# Patient Record
Sex: Male | Born: 1960 | Race: Black or African American | Hispanic: No | Marital: Married | Smoking: Former smoker
Health system: Southern US, Community
[De-identification: ages and names within clinical notes are randomized; demographics above are authoritative.]

## PROBLEM LIST (undated history)

## (undated) DIAGNOSIS — E785 Hyperlipidemia, unspecified: Secondary | ICD-10-CM

## (undated) DIAGNOSIS — N183 Chronic kidney disease, stage 3 unspecified: Secondary | ICD-10-CM

## (undated) DIAGNOSIS — I10 Essential (primary) hypertension: Secondary | ICD-10-CM

## (undated) DIAGNOSIS — E119 Type 2 diabetes mellitus without complications: Secondary | ICD-10-CM

## (undated) DIAGNOSIS — F32A Depression, unspecified: Secondary | ICD-10-CM

## (undated) DIAGNOSIS — Z91199 Patient's noncompliance with other medical treatment and regimen due to unspecified reason: Secondary | ICD-10-CM

## (undated) DIAGNOSIS — J449 Chronic obstructive pulmonary disease, unspecified: Secondary | ICD-10-CM

## (undated) DIAGNOSIS — F1010 Alcohol abuse, uncomplicated: Secondary | ICD-10-CM

## (undated) DIAGNOSIS — F329 Major depressive disorder, single episode, unspecified: Secondary | ICD-10-CM

## (undated) DIAGNOSIS — I2510 Atherosclerotic heart disease of native coronary artery without angina pectoris: Secondary | ICD-10-CM

## (undated) DIAGNOSIS — C259 Malignant neoplasm of pancreas, unspecified: Secondary | ICD-10-CM

## (undated) DIAGNOSIS — I739 Peripheral vascular disease, unspecified: Secondary | ICD-10-CM

## (undated) DIAGNOSIS — F1410 Cocaine abuse, uncomplicated: Secondary | ICD-10-CM

## (undated) DIAGNOSIS — Z9119 Patient's noncompliance with other medical treatment and regimen: Secondary | ICD-10-CM

## (undated) HISTORY — DX: Peripheral vascular disease, unspecified: I73.9

## (undated) HISTORY — PX: CARDIAC CATHETERIZATION: SHX172

## (undated) HISTORY — PX: CORONARY STENT PLACEMENT: SHX1402

## (undated) HISTORY — PX: FRACTURE SURGERY: SHX138

---

## 2009-07-23 ENCOUNTER — Emergency Department (HOSPITAL_COMMUNITY): Admission: EM | Admit: 2009-07-23 | Discharge: 2009-07-23 | Payer: Self-pay | Admitting: Emergency Medicine

## 2009-07-28 ENCOUNTER — Inpatient Hospital Stay (HOSPITAL_COMMUNITY): Admission: EM | Admit: 2009-07-28 | Discharge: 2009-07-30 | Payer: Self-pay | Admitting: Emergency Medicine

## 2009-10-01 ENCOUNTER — Inpatient Hospital Stay (HOSPITAL_COMMUNITY): Admission: EM | Admit: 2009-10-01 | Discharge: 2009-10-03 | Payer: Self-pay | Admitting: Emergency Medicine

## 2009-10-01 ENCOUNTER — Encounter (INDEPENDENT_AMBULATORY_CARE_PROVIDER_SITE_OTHER): Payer: Self-pay | Admitting: Internal Medicine

## 2010-01-07 ENCOUNTER — Inpatient Hospital Stay (HOSPITAL_COMMUNITY): Admission: EM | Admit: 2010-01-07 | Discharge: 2010-01-09 | Payer: Self-pay | Admitting: Emergency Medicine

## 2010-02-01 ENCOUNTER — Inpatient Hospital Stay (HOSPITAL_COMMUNITY): Admission: EM | Admit: 2010-02-01 | Discharge: 2010-02-03 | Payer: Self-pay | Admitting: Emergency Medicine

## 2010-03-16 ENCOUNTER — Encounter (INDEPENDENT_AMBULATORY_CARE_PROVIDER_SITE_OTHER): Payer: Self-pay | Admitting: Internal Medicine

## 2010-03-18 ENCOUNTER — Encounter: Payer: Self-pay | Admitting: Interventional Cardiology

## 2010-03-19 ENCOUNTER — Other Ambulatory Visit: Payer: Self-pay | Admitting: Cardiovascular Disease

## 2010-03-19 ENCOUNTER — Inpatient Hospital Stay (HOSPITAL_COMMUNITY): Admission: EM | Admit: 2010-03-19 | Discharge: 2010-03-20 | Payer: Self-pay | Admitting: Emergency Medicine

## 2010-03-20 ENCOUNTER — Other Ambulatory Visit: Payer: Self-pay | Admitting: Cardiovascular Disease

## 2010-04-15 ENCOUNTER — Inpatient Hospital Stay (HOSPITAL_COMMUNITY): Admission: EM | Admit: 2010-04-15 | Discharge: 2010-04-16 | Payer: Self-pay | Admitting: Emergency Medicine

## 2010-05-19 ENCOUNTER — Emergency Department (HOSPITAL_COMMUNITY): Admission: EM | Admit: 2010-05-19 | Discharge: 2010-05-19 | Payer: Self-pay | Admitting: Emergency Medicine

## 2010-09-11 ENCOUNTER — Ambulatory Visit: Payer: Self-pay | Admitting: Cardiovascular Disease

## 2010-09-11 ENCOUNTER — Observation Stay (HOSPITAL_COMMUNITY): Admission: EM | Admit: 2010-09-11 | Discharge: 2010-09-13 | Payer: Self-pay | Admitting: Emergency Medicine

## 2010-09-12 ENCOUNTER — Ambulatory Visit: Payer: Self-pay | Admitting: Vascular Surgery

## 2010-09-12 ENCOUNTER — Encounter (INDEPENDENT_AMBULATORY_CARE_PROVIDER_SITE_OTHER): Payer: Self-pay | Admitting: Internal Medicine

## 2010-10-21 ENCOUNTER — Emergency Department (HOSPITAL_COMMUNITY): Admission: EM | Admit: 2010-10-21 | Discharge: 2010-10-21 | Payer: Self-pay | Admitting: Emergency Medicine

## 2010-10-27 ENCOUNTER — Emergency Department (HOSPITAL_COMMUNITY)
Admission: EM | Admit: 2010-10-27 | Discharge: 2010-10-27 | Payer: Self-pay | Source: Home / Self Care | Admitting: Emergency Medicine

## 2010-11-12 ENCOUNTER — Inpatient Hospital Stay (HOSPITAL_COMMUNITY)
Admission: EM | Admit: 2010-11-12 | Discharge: 2010-11-14 | Payer: Self-pay | Source: Home / Self Care | Attending: Internal Medicine | Admitting: Internal Medicine

## 2010-12-20 NOTE — H&P (Signed)
NAMEDEE, MADAY NO.:  [id number]  MEDICAL RECORD NO.:  [id number]          PATIENT TYPE:  EMS  LOCATION:  ED                           FACILITY:  [facility]  PHYSICIAN:  Massie Maroon, MD        DATE OF BIRTH:  19-Oct-1961  DATE OF ADMISSION:  11/12/2010 DATE OF DISCHARGE:                             HISTORY & PHYSICAL   CHIEF COMPLAINT:  "I was short of breath."  HISTORY OF PRESENT ILLNESS:  A 50 year old male with a history of asthma, CAD status post MI, stent x3, apparently complains of shortness of breath starting about 3 days ago.  The patient denies any coughing, fever, chills, chest pain, palpitations, nausea, vomiting, but does note that he goes to [employer] and is exposed to secondhand smoke at that meeting. The patient thinks that this triggered his asthma to become worse and his lungs to become wheezy.  The patient had a chest x-ray in the ED which was negative for any acute process.  His white count was withinnormal limits.  ABGs showed a pH 7.374, pCO2 of 40.3, pO2 of 75.4. Though his ABG looks fairly good on 3 L nasal cannula, his clinical lung sounds are tight and diffusely wheezy.  The patient will be admitted for asthma exacerbation.  PAST MEDICAL HISTORY: 1. CAD status post MI, stent x3. 2. Hypertension. 3. Hyperlipidemia. 4. Type 2 diabetes. 5. Chronic kidney disease with a baseline creatinine of 1.5 (chronic     kidney disease stage 3). 6. Asthma.  PAST SURGICAL HISTORY: 1. RCA stent June 2010. 2. October 02, 2009, cardiac catheterization, moderate stenosis in the     proximal LAD, but not flow limiting at that time. 3. March 20, 2010, cardiac catheterization by Dr. Kristeen Miss,     PTCA/stent to the proximal LAD. 4. Apr 17, 2010, cardiac catheterization showed patent stents in the     LAD and distal RCA.  SOCIAL HISTORY:  The patient does not smoke or drink at the present time.  He is a former smoker who quit in October 2009.  He is  living with friends at the [facility].  FAMILY HISTORY:  Positive for breast cancer.  MEDICATIONS: 1. Albuterol inhaler 2 puffs q.4 h. p.r.n. 2. Albuterol nebulizer 0.63 mg/3 mL one neb q.4 h. p.r.n. 3. Artificial tears both eyes 1 drop q.i.d. 4. Asmanex 220 mcg 2 puffs b.i.d. 5. Aspirin 81 mg daily. 6. Citalopram 40 mg p.o. daily. 7. Crestor 40 mg one-half p.o. daily. 8. DermaCerin topical cream OTC 1 application b.i.d. 9. Docusate 100 mg p.o. b.i.d. 10.Ferrous sulfate 325 one p.o. q.a.m. 11.Fish oil 1000 mg p.o. b.i.d. 12.Fluticasone 44 mcg 2 puffs b.i.d. (redundant since the patient is     taking Asmanex). 13.Folic acid 1 mg one p.o. q.a.m. 14.Formoterol 12 mcg 1 caplet p.o. q.a.m. 15.Hydroxyzine 25 mg p.o. b.i.d. p.r.n. 16.Ibuprofen 600 mg p.o. q.i.d. p.r.n. 17.Lantus 60 units subcu daily. 18.Lisinopril/hydrochlorothiazide 20/12.5 mg one p.o. q.a.m. 19.MiraLax 17 g p.o. daily. 20.Multivitamin one p.o. daily. 21.Naphazoline ophthalmic 0.025% both eyes 1 GTT daily. 22.Sublingual nitroglycerin 0.4 mg p.o.  q.5 minutes p.r.n. chest pain     up to 3 doses. 23.Quetiapine (Seroquel) 100 mg p.o. q.h.s. 24.Trazodone 100 mg one-half to one p.o. q.h.s. p.r.n. insomnia.  ALLERGIES:  The patient is highly sensitive to NOVOLOG.  REVIEW OF SYSTEMS:  Negative for all 10 organ systems except for pertinent positives stated above.  PHYSICAL EXAMINATION:  VITAL SIGNS:  Temperature 97.8, pulse 88, blood pressure 124/72, pulse ox is 93% on room air. HEENT:  Anicteric. NECK:  No JVD. HEART:  Borderline tachycardic, S1, S2.  No murmurs, gallops, or rubs. LUNGS:  Diffusely wheezy bilateral lung fields, tight. ABDOMEN:  Soft, nontender, nondistended.  Positive bowel sounds. EXTREMITIES:  No cyanosis, clubbing, or edema. SKIN:  No rashes. LYMPH NODES:  No adenopathy. NEURO EXAM:  Nonfocal, cranial nerves II through XII intact.  Reflexes 2+, symmetric, diffuse with downgoing toes  bilaterally, motor strength 5/5 in all 4 extremities, pinprick intact.  LABORATORY DATA:  ABG; pH 7.374, pCO2 of 40.3, pO2 of 75.4 on 3 L nasal cannula.  WBC 7.6, hemoglobin 14.8, platelet count 161.  Sodium 134, potassium 4.2, BUN 37, creatinine 1.66.  Troponin I less than 0.05. Chest x-ray negative for any acute process.  ASSESSMENT AND PLAN: 1. Asthma exacerbation:  The patient will be placed on Solu-Medrol 80     mg IV q.8 h. along with Symbicort 160/4.5 two puffs p.o. b.i.d.     The patient will be started on Singulair.  We will discontinue     Asmanex and Flovent at the present time as well as Foradil.     Hopefully, he will open up.  He will use Xopenex since there is     less tachycardia with this. 2. Coronary artery disease status post myocardial infarction, status     post stent.  Continue aspirin, clopidogrel, Crestor. 3. Diabetes:  Continue Lantus, regular insulin-sensitive sliding     scale. 4. Hypertension:  Continue lisinopril/hydrochlorothiazide. 5. Deep venous thrombosis prophylaxis.  Lovenox.     Massie Maroon, MD     JYK/MEDQ  D:  11/13/2010  T:  11/13/2010  Job:  [id number]  cc:   Vesta Mixer, M.D.Fax: [phone]  Dr. [facility]  Electronically Signed by Pearson Grippe MD on 12/20/2010 08:03:29 PM

## 2011-02-03 LAB — POCT CARDIAC MARKERS
CKMB, poc: 1.6 ng/mL (ref 1.0–8.0)
Myoglobin, poc: 112 ng/mL (ref 12–200)
Troponin i, poc: <0.05 ng/mL (ref 0.00–0.09)

## 2011-02-03 LAB — CBC
HCT: 41.3 % (ref 39.0–52.0)
Hemoglobin: 14.8 g/dL (ref 13.0–17.0)
MCH: 26.5 pg (ref 26.0–34.0)
MCHC: 35.8 g/dL (ref 30.0–36.0)
MCV: 73.9 fL — ABNORMAL LOW (ref 78.0–100.0)
Platelets: 161 10*3/uL (ref 150–400)
RBC: 5.59 MIL/uL (ref 4.22–5.81)
RDW: 14.3 % (ref 11.5–15.5)
WBC: 7.6 10*3/uL (ref 4.0–10.5)

## 2011-02-03 LAB — GLUCOSE, CAPILLARY
Glucose-Capillary: 176 mg/dL — ABNORMAL HIGH (ref 70–99)
Glucose-Capillary: 230 mg/dL — ABNORMAL HIGH (ref 70–99)
Glucose-Capillary: 238 mg/dL — ABNORMAL HIGH (ref 70–99)
Glucose-Capillary: 288 mg/dL — ABNORMAL HIGH (ref 70–99)
Glucose-Capillary: >600 mg/dL (ref 70–99)
Glucose-Capillary: >600 mg/dL (ref 70–99)
Glucose-Capillary: >600 mg/dL (ref 70–99)

## 2011-02-03 LAB — DIFFERENTIAL
Basophils Absolute: 0 10*3/uL (ref 0.0–0.1)
Basophils Relative: 0 % (ref 0–1)
Eosinophils Absolute: 0.8 10*3/uL — ABNORMAL HIGH (ref 0.0–0.7)
Eosinophils Relative: 10 % — ABNORMAL HIGH (ref 0–5)
Lymphocytes Relative: 36 % (ref 12–46)
Lymphs Abs: 2.7 10*3/uL (ref 0.7–4.0)
Monocytes Absolute: 0.5 10*3/uL (ref 0.1–1.0)
Monocytes Relative: 7 % (ref 3–12)
Neutro Abs: 3.6 10*3/uL (ref 1.7–7.7)
Neutrophils Relative %: 47 % (ref 43–77)

## 2011-02-03 LAB — BLOOD GAS, ARTERIAL
Acid-base deficit: 1.6 mmol/L (ref 0.0–2.0)
Bicarbonate: 22.9 mEq/L (ref 20.0–24.0)
Drawn by: 229971
O2 Content: 3 L/min
O2 Saturation: 94.9 %
Patient temperature: 98.6
TCO2: 20.3 mmol/L (ref 0–100)
pCO2 arterial: 40.3 mmHg (ref 35.0–45.0)
pH, Arterial: 7.374 (ref 7.350–7.450)
pO2, Arterial: 75.4 mmHg — ABNORMAL LOW (ref 80.0–100.0)

## 2011-02-03 LAB — BLOOD GAS, VENOUS
Acid-Base Excess: 0.1 mmol/L (ref 0.0–2.0)
Bicarbonate: 24.8 mEq/L — ABNORMAL HIGH (ref 20.0–24.0)
O2 Content: 8 L/min
O2 Saturation: 84.9 %
Patient temperature: 98.6
TCO2: 21.8 mmol/L (ref 0–100)
pCO2, Ven: 42.6 mmHg — ABNORMAL LOW (ref 45.0–50.0)
pH, Ven: 7.382 — ABNORMAL HIGH (ref 7.250–7.300)
pO2, Ven: 49.9 mmHg — ABNORMAL HIGH (ref 30.0–45.0)

## 2011-02-03 LAB — BASIC METABOLIC PANEL
BUN: 28 mg/dL — ABNORMAL HIGH (ref 6–23)
BUN: 35 mg/dL — ABNORMAL HIGH (ref 6–23)
BUN: 37 mg/dL — ABNORMAL HIGH (ref 6–23)
CO2: 20 mEq/L (ref 19–32)
CO2: 23 mEq/L (ref 19–32)
CO2: 24 mEq/L (ref 19–32)
Calcium: 9.1 mg/dL (ref 8.4–10.5)
Calcium: 9.1 mg/dL (ref 8.4–10.5)
Calcium: 9.2 mg/dL (ref 8.4–10.5)
Chloride: 101 mEq/L (ref 96–112)
Chloride: 103 mEq/L (ref 96–112)
Chloride: 110 mEq/L (ref 96–112)
Creatinine, Ser: 1.4 mg/dL (ref 0.4–1.5)
Creatinine, Ser: 1.66 mg/dL — ABNORMAL HIGH (ref 0.4–1.5)
Creatinine, Ser: 1.75 mg/dL — ABNORMAL HIGH (ref 0.4–1.5)
GFR calc Af Amer: 50 mL/min — ABNORMAL LOW (ref >60)
GFR calc Af Amer: 53 mL/min — ABNORMAL LOW (ref >60)
GFR calc Af Amer: >60 mL/min (ref >60)
GFR calc non Af Amer: 41 mL/min — ABNORMAL LOW (ref >60)
GFR calc non Af Amer: 44 mL/min — ABNORMAL LOW (ref >60)
GFR calc non Af Amer: 53 mL/min — ABNORMAL LOW (ref >60)
Glucose, Bld: 179 mg/dL — ABNORMAL HIGH (ref 70–99)
Glucose, Bld: 351 mg/dL — ABNORMAL HIGH (ref 70–99)
Glucose, Bld: 687 mg/dL (ref 70–99)
Potassium: 4.1 mEq/L (ref 3.5–5.1)
Potassium: 4.2 mEq/L (ref 3.5–5.1)
Potassium: 6 mEq/L — ABNORMAL HIGH (ref 3.5–5.1)
Sodium: 131 mEq/L — ABNORMAL LOW (ref 135–145)
Sodium: 134 mEq/L — ABNORMAL LOW (ref 135–145)
Sodium: 140 mEq/L (ref 135–145)

## 2011-02-04 LAB — URINALYSIS, ROUTINE W REFLEX MICROSCOPIC
Bilirubin Urine: NEGATIVE
Glucose, UA: NEGATIVE mg/dL
Hgb urine dipstick: NEGATIVE
Ketones, ur: NEGATIVE mg/dL
Protein, ur: NEGATIVE mg/dL

## 2011-02-04 LAB — BASIC METABOLIC PANEL
BUN: 24 mg/dL — ABNORMAL HIGH (ref 6–23)
CO2: 22 mEq/L (ref 19–32)
Chloride: 106 mEq/L (ref 96–112)
Creatinine, Ser: 1.59 mg/dL — ABNORMAL HIGH (ref 0.4–1.5)
Glucose, Bld: 182 mg/dL — ABNORMAL HIGH (ref 70–99)

## 2011-02-04 LAB — DIFFERENTIAL
Basophils Absolute: 0 10*3/uL (ref 0.0–0.1)
Eosinophils Absolute: 1 10*3/uL — ABNORMAL HIGH (ref 0.0–0.7)
Eosinophils Relative: 13 % — ABNORMAL HIGH (ref 0–5)
Monocytes Absolute: 0.6 10*3/uL (ref 0.1–1.0)

## 2011-02-04 LAB — CBC
MCH: 26.9 pg (ref 26.0–34.0)
MCV: 80.3 fL (ref 78.0–100.0)
Platelets: 177 10*3/uL (ref 150–400)
RDW: 14.7 % (ref 11.5–15.5)

## 2011-02-04 LAB — POCT I-STAT, CHEM 8
BUN: 27 mg/dL — ABNORMAL HIGH (ref 6–23)
Calcium, Ion: 1.06 mmol/L — ABNORMAL LOW (ref 1.12–1.32)
Creatinine, Ser: 1.5 mg/dL (ref 0.4–1.5)
TCO2: 20 mmol/L (ref 0–100)

## 2011-02-05 LAB — URINALYSIS, ROUTINE W REFLEX MICROSCOPIC
Bilirubin Urine: NEGATIVE
Ketones, ur: NEGATIVE mg/dL
Nitrite: NEGATIVE
Protein, ur: NEGATIVE mg/dL
Urobilinogen, UA: 0.2 mg/dL (ref 0.0–1.0)
pH: 5.5 (ref 5.0–8.0)

## 2011-02-05 LAB — GLUCOSE, CAPILLARY
Glucose-Capillary: 122 mg/dL — ABNORMAL HIGH (ref 70–99)
Glucose-Capillary: 123 mg/dL — ABNORMAL HIGH (ref 70–99)
Glucose-Capillary: 126 mg/dL — ABNORMAL HIGH (ref 70–99)
Glucose-Capillary: 164 mg/dL — ABNORMAL HIGH (ref 70–99)
Glucose-Capillary: 175 mg/dL — ABNORMAL HIGH (ref 70–99)
Glucose-Capillary: 195 mg/dL — ABNORMAL HIGH (ref 70–99)
Glucose-Capillary: 199 mg/dL — ABNORMAL HIGH (ref 70–99)
Glucose-Capillary: 219 mg/dL — ABNORMAL HIGH (ref 70–99)
Glucose-Capillary: 235 mg/dL — ABNORMAL HIGH (ref 70–99)
Glucose-Capillary: 272 mg/dL — ABNORMAL HIGH (ref 70–99)
Glucose-Capillary: 277 mg/dL — ABNORMAL HIGH (ref 70–99)
Glucose-Capillary: 301 mg/dL — ABNORMAL HIGH (ref 70–99)
Glucose-Capillary: 36 mg/dL — CL (ref 70–99)
Glucose-Capillary: 395 mg/dL — ABNORMAL HIGH (ref 70–99)
Glucose-Capillary: 77 mg/dL (ref 70–99)

## 2011-02-05 LAB — CBC
MCH: 26.8 pg (ref 26.0–34.0)
MCH: 27.1 pg (ref 26.0–34.0)
MCHC: 33.3 g/dL (ref 30.0–36.0)
MCHC: 33.9 g/dL (ref 30.0–36.0)
MCHC: 34 g/dL (ref 30.0–36.0)
MCV: 79 fL (ref 78.0–100.0)
MCV: 79.8 fL (ref 78.0–100.0)
Platelets: 163 10*3/uL (ref 150–400)
Platelets: 173 10*3/uL (ref 150–400)
Platelets: 178 10*3/uL (ref 150–400)
RBC: 5.69 MIL/uL (ref 4.22–5.81)
RDW: 14.6 % (ref 11.5–15.5)
RDW: 14.8 % (ref 11.5–15.5)
RDW: 15 % (ref 11.5–15.5)
WBC: 6 10*3/uL (ref 4.0–10.5)

## 2011-02-05 LAB — COMPREHENSIVE METABOLIC PANEL
AST: 36 U/L (ref 0–37)
Albumin: 3.3 g/dL — ABNORMAL LOW (ref 3.5–5.2)
BUN: 20 mg/dL (ref 6–23)
Calcium: 9.1 mg/dL (ref 8.4–10.5)
Chloride: 105 mEq/L (ref 96–112)
Creatinine, Ser: 1.41 mg/dL (ref 0.4–1.5)
GFR calc Af Amer: >60 mL/min (ref >60)
Total Bilirubin: 0.4 mg/dL (ref 0.3–1.2)
Total Protein: 6.3 g/dL (ref 6.0–8.3)

## 2011-02-05 LAB — TSH: TSH: 4.06 u[IU]/mL (ref 0.350–4.500)

## 2011-02-05 LAB — CREATININE, URINE, RANDOM: Creatinine, Urine: 83.4 mg/dL

## 2011-02-05 LAB — LIPID PANEL
Cholesterol: 84 mg/dL (ref 0–200)
HDL: 37 mg/dL — ABNORMAL LOW (ref >39)
Total CHOL/HDL Ratio: 2.3 RATIO
Triglycerides: 118 mg/dL (ref <150)

## 2011-02-05 LAB — DIFFERENTIAL
Basophils Absolute: 0 10*3/uL (ref 0.0–0.1)
Lymphocytes Relative: 38 % (ref 12–46)
Lymphs Abs: 2.2 10*3/uL (ref 0.7–4.0)
Monocytes Absolute: 0.5 10*3/uL (ref 0.1–1.0)
Neutro Abs: 2.5 10*3/uL (ref 1.7–7.7)

## 2011-02-05 LAB — BASIC METABOLIC PANEL
BUN: 26 mg/dL — ABNORMAL HIGH (ref 6–23)
Creatinine, Ser: 1.59 mg/dL — ABNORMAL HIGH (ref 0.4–1.5)
GFR calc non Af Amer: 46 mL/min — ABNORMAL LOW (ref >60)
Glucose, Bld: 397 mg/dL — ABNORMAL HIGH (ref 70–99)

## 2011-02-05 LAB — POCT I-STAT, CHEM 8
Calcium, Ion: 1.11 mmol/L — ABNORMAL LOW (ref 1.12–1.32)
Creatinine, Ser: 1.7 mg/dL — ABNORMAL HIGH (ref 0.4–1.5)
Hemoglobin: 16 g/dL (ref 13.0–17.0)
Sodium: 129 mEq/L — ABNORMAL LOW (ref 135–145)
TCO2: 23 mmol/L (ref 0–100)

## 2011-02-05 LAB — BRAIN NATRIURETIC PEPTIDE: Pro B Natriuretic peptide (BNP): <30 pg/mL (ref 0.0–100.0)

## 2011-02-05 LAB — HEMOGLOBIN A1C: Mean Plasma Glucose: 283 mg/dL — ABNORMAL HIGH (ref <117)

## 2011-02-05 LAB — CARDIAC PANEL(CRET KIN+CKTOT+MB+TROPI)
Relative Index: 1.6 (ref 0.0–2.5)
Total CK: 461 U/L — ABNORMAL HIGH (ref 7–232)
Troponin I: 0.05 ng/mL (ref 0.00–0.06)
Troponin I: 0.07 ng/mL — ABNORMAL HIGH (ref 0.00–0.06)

## 2011-02-05 LAB — POCT CARDIAC MARKERS: Myoglobin, poc: 147 ng/mL (ref 12–200)

## 2011-02-09 LAB — DIFFERENTIAL
Basophils Absolute: 0.1 10*3/uL (ref 0.0–0.1)
Basophils Relative: 1 % (ref 0–1)
Eosinophils Absolute: 0.5 10*3/uL (ref 0.0–0.7)
Monocytes Absolute: 0.7 10*3/uL (ref 0.1–1.0)
Neutro Abs: 2.6 10*3/uL (ref 1.7–7.7)
Neutrophils Relative %: 43 % (ref 43–77)

## 2011-02-09 LAB — CBC
HCT: 40.5 % (ref 39.0–52.0)
Platelets: 176 10*3/uL (ref 150–400)
RBC: 5 MIL/uL (ref 4.22–5.81)
RDW: 15.7 % — ABNORMAL HIGH (ref 11.5–15.5)
WBC: 6.2 10*3/uL (ref 4.0–10.5)

## 2011-02-09 LAB — BASIC METABOLIC PANEL
BUN: 22 mg/dL (ref 6–23)
Creatinine, Ser: 1.55 mg/dL — ABNORMAL HIGH (ref 0.4–1.5)
GFR calc Af Amer: 57 mL/min — ABNORMAL LOW (ref >60)
GFR calc non Af Amer: 48 mL/min — ABNORMAL LOW (ref >60)
Potassium: 5 mEq/L (ref 3.5–5.1)

## 2011-02-09 LAB — POCT CARDIAC MARKERS
CKMB, poc: 2.3 ng/mL (ref 1.0–8.0)
CKMB, poc: 2.8 ng/mL (ref 1.0–8.0)
Myoglobin, poc: 122 ng/mL (ref 12–200)

## 2011-02-10 LAB — POCT CARDIAC MARKERS
CKMB, poc: 2.2 ng/mL (ref 1.0–8.0)
Myoglobin, poc: 303 ng/mL (ref 12–200)
Troponin i, poc: <0.05 ng/mL (ref 0.00–0.09)

## 2011-02-10 LAB — CARDIAC PANEL(CRET KIN+CKTOT+MB+TROPI)
CK, MB: 2.6 ng/mL (ref 0.3–4.0)
Relative Index: 0.2 (ref 0.0–2.5)
Relative Index: 0.2 (ref 0.0–2.5)
Total CK: 1065 U/L — ABNORMAL HIGH (ref 7–232)
Troponin I: 0.03 ng/mL (ref 0.00–0.06)

## 2011-02-10 LAB — CK TOTAL AND CKMB (NOT AT ARMC)
CK, MB: 2.8 ng/mL (ref 0.3–4.0)
CK, MB: 3.1 ng/mL (ref 0.3–4.0)
Relative Index: 0.1 (ref 0.0–2.5)
Total CK: 1942 U/L — ABNORMAL HIGH (ref 7–232)
Total CK: 2173 U/L — ABNORMAL HIGH (ref 7–232)

## 2011-02-10 LAB — CBC
HCT: 37.1 % — ABNORMAL LOW (ref 39.0–52.0)
Hemoglobin: 14.5 g/dL (ref 13.0–17.0)
MCHC: 33.8 g/dL (ref 30.0–36.0)
MCV: 81 fL (ref 78.0–100.0)
Platelets: 159 10*3/uL (ref 150–400)
RBC: 5.28 MIL/uL (ref 4.22–5.81)
RDW: 15.1 % (ref 11.5–15.5)
WBC: 7 10*3/uL (ref 4.0–10.5)

## 2011-02-10 LAB — POCT I-STAT, CHEM 8
BUN: 33 mg/dL — ABNORMAL HIGH (ref 6–23)
Calcium, Ion: 1.22 mmol/L (ref 1.12–1.32)
Chloride: 103 mEq/L (ref 96–112)
HCT: 48 % (ref 39.0–52.0)
Potassium: 4.8 mEq/L (ref 3.5–5.1)
Sodium: 132 mEq/L — ABNORMAL LOW (ref 135–145)

## 2011-02-10 LAB — GLUCOSE, CAPILLARY
Glucose-Capillary: 295 mg/dL — ABNORMAL HIGH (ref 70–99)
Glucose-Capillary: 75 mg/dL (ref 70–99)

## 2011-02-10 LAB — DIFFERENTIAL
Basophils Relative: 1 % (ref 0–1)
Lymphocytes Relative: 35 % (ref 12–46)
Lymphs Abs: 2.4 10*3/uL (ref 0.7–4.0)
Monocytes Absolute: 0.7 10*3/uL (ref 0.1–1.0)
Monocytes Relative: 10 % (ref 3–12)
Neutro Abs: 3.5 10*3/uL (ref 1.7–7.7)
Neutrophils Relative %: 50 % (ref 43–77)

## 2011-02-10 LAB — RAPID URINE DRUG SCREEN, HOSP PERFORMED
Barbiturates: NOT DETECTED
Benzodiazepines: NOT DETECTED

## 2011-02-10 LAB — BASIC METABOLIC PANEL
BUN: 31 mg/dL — ABNORMAL HIGH (ref 6–23)
Creatinine, Ser: 1.68 mg/dL — ABNORMAL HIGH (ref 0.4–1.5)
GFR calc Af Amer: 52 mL/min — ABNORMAL LOW (ref >60)
GFR calc non Af Amer: 43 mL/min — ABNORMAL LOW (ref >60)
Potassium: 4.1 mEq/L (ref 3.5–5.1)

## 2011-02-10 LAB — APTT: aPTT: 29 seconds (ref 24–37)

## 2011-02-10 LAB — PROTIME-INR: INR: 0.99 (ref 0.00–1.49)

## 2011-02-11 LAB — CBC
HCT: 40.2 % (ref 39.0–52.0)
Hemoglobin: 13.3 g/dL (ref 13.0–17.0)
Hemoglobin: 13.9 g/dL (ref 13.0–17.0)
Hemoglobin: 14 g/dL (ref 13.0–17.0)
MCHC: 32.8 g/dL (ref 30.0–36.0)
MCHC: 33.1 g/dL (ref 30.0–36.0)
MCHC: 33.2 g/dL (ref 30.0–36.0)
MCHC: 33.4 g/dL (ref 30.0–36.0)
MCHC: 34.7 g/dL (ref 30.0–36.0)
MCV: 80.1 fL (ref 78.0–100.0)
MCV: 81.6 fL (ref 78.0–100.0)
MCV: 81.8 fL (ref 78.0–100.0)
Platelets: 152 10*3/uL (ref 150–400)
Platelets: 181 10*3/uL (ref 150–400)
Platelets: 184 10*3/uL (ref 150–400)
RBC: 4.76 MIL/uL (ref 4.22–5.81)
RBC: 4.95 MIL/uL (ref 4.22–5.81)
RBC: 5.16 MIL/uL (ref 4.22–5.81)
RDW: 14.9 % (ref 11.5–15.5)
RDW: 15.1 % (ref 11.5–15.5)
WBC: 5.9 10*3/uL (ref 4.0–10.5)

## 2011-02-11 LAB — COMPREHENSIVE METABOLIC PANEL
ALT: 44 U/L (ref 0–53)
AST: 28 U/L (ref 0–37)
AST: 29 U/L (ref 0–37)
Albumin: 3.3 g/dL — ABNORMAL LOW (ref 3.5–5.2)
Alkaline Phosphatase: 57 U/L (ref 39–117)
BUN: 26 mg/dL — ABNORMAL HIGH (ref 6–23)
CO2: 27 mEq/L (ref 19–32)
Calcium: 9.6 mg/dL (ref 8.4–10.5)
Chloride: 103 mEq/L (ref 96–112)
Creatinine, Ser: 1.52 mg/dL — ABNORMAL HIGH (ref 0.4–1.5)
GFR calc Af Amer: 59 mL/min — ABNORMAL LOW (ref >60)
GFR calc Af Amer: >60 mL/min (ref >60)
GFR calc non Af Amer: 49 mL/min — ABNORMAL LOW (ref >60)
GFR calc non Af Amer: 51 mL/min — ABNORMAL LOW (ref >60)
Glucose, Bld: 158 mg/dL — ABNORMAL HIGH (ref 70–99)
Potassium: 4.5 mEq/L (ref 3.5–5.1)
Sodium: 136 mEq/L (ref 135–145)
Total Bilirubin: 0.2 mg/dL — ABNORMAL LOW (ref 0.3–1.2)

## 2011-02-11 LAB — GLUCOSE, CAPILLARY
Glucose-Capillary: 138 mg/dL — ABNORMAL HIGH (ref 70–99)
Glucose-Capillary: 163 mg/dL — ABNORMAL HIGH (ref 70–99)
Glucose-Capillary: 168 mg/dL — ABNORMAL HIGH (ref 70–99)
Glucose-Capillary: 210 mg/dL — ABNORMAL HIGH (ref 70–99)
Glucose-Capillary: 221 mg/dL — ABNORMAL HIGH (ref 70–99)
Glucose-Capillary: 229 mg/dL — ABNORMAL HIGH (ref 70–99)
Glucose-Capillary: 241 mg/dL — ABNORMAL HIGH (ref 70–99)
Glucose-Capillary: 243 mg/dL — ABNORMAL HIGH (ref 70–99)
Glucose-Capillary: 252 mg/dL — ABNORMAL HIGH (ref 70–99)
Glucose-Capillary: 263 mg/dL — ABNORMAL HIGH (ref 70–99)
Glucose-Capillary: 271 mg/dL — ABNORMAL HIGH (ref 70–99)
Glucose-Capillary: 57 mg/dL — ABNORMAL LOW (ref 70–99)
Glucose-Capillary: 64 mg/dL — ABNORMAL LOW (ref 70–99)
Glucose-Capillary: 68 mg/dL — ABNORMAL LOW (ref 70–99)
Glucose-Capillary: 72 mg/dL (ref 70–99)
Glucose-Capillary: 78 mg/dL (ref 70–99)
Glucose-Capillary: 80 mg/dL (ref 70–99)

## 2011-02-11 LAB — BASIC METABOLIC PANEL
BUN: 20 mg/dL (ref 6–23)
CO2: 23 mEq/L (ref 19–32)
CO2: 24 mEq/L (ref 19–32)
CO2: 25 mEq/L (ref 19–32)
Calcium: 9.1 mg/dL (ref 8.4–10.5)
Chloride: 103 mEq/L (ref 96–112)
Creatinine, Ser: 1.56 mg/dL — ABNORMAL HIGH (ref 0.4–1.5)
GFR calc Af Amer: 57 mL/min — ABNORMAL LOW (ref >60)
Glucose, Bld: 149 mg/dL — ABNORMAL HIGH (ref 70–99)
Glucose, Bld: 245 mg/dL — ABNORMAL HIGH (ref 70–99)
Glucose, Bld: 384 mg/dL — ABNORMAL HIGH (ref 70–99)
Potassium: 4.6 mEq/L (ref 3.5–5.1)
Potassium: 4.8 mEq/L (ref 3.5–5.1)
Sodium: 133 mEq/L — ABNORMAL LOW (ref 135–145)
Sodium: 134 mEq/L — ABNORMAL LOW (ref 135–145)

## 2011-02-11 LAB — CARDIAC PANEL(CRET KIN+CKTOT+MB+TROPI)
Relative Index: 1.1 (ref 0.0–2.5)
Total CK: 214 U/L (ref 7–232)
Troponin I: 0.01 ng/mL (ref 0.00–0.06)
Troponin I: 0.02 ng/mL (ref 0.00–0.06)

## 2011-02-11 LAB — DIFFERENTIAL
Basophils Relative: 1 % (ref 0–1)
Eosinophils Absolute: 0.3 10*3/uL (ref 0.0–0.7)
Eosinophils Relative: 6 % — ABNORMAL HIGH (ref 0–5)
Eosinophils Relative: 6 % — ABNORMAL HIGH (ref 0–5)
Lymphocytes Relative: 47 % — ABNORMAL HIGH (ref 12–46)
Lymphs Abs: 2.8 10*3/uL (ref 0.7–4.0)
Monocytes Absolute: 0.5 10*3/uL (ref 0.1–1.0)
Monocytes Relative: 9 % (ref 3–12)
Neutro Abs: 2.3 10*3/uL (ref 1.7–7.7)

## 2011-02-11 LAB — CK TOTAL AND CKMB (NOT AT ARMC)
CK, MB: 2.6 ng/mL (ref 0.3–4.0)
Relative Index: 1 (ref 0.0–2.5)

## 2011-02-11 LAB — MAGNESIUM
Magnesium: 2 mg/dL (ref 1.5–2.5)
Magnesium: 2 mg/dL (ref 1.5–2.5)
Magnesium: 2.1 mg/dL (ref 1.5–2.5)

## 2011-02-11 LAB — LIPID PANEL
LDL Cholesterol: 37 mg/dL (ref 0–99)
VLDL: 33 mg/dL (ref 0–40)

## 2011-02-11 LAB — POCT CARDIAC MARKERS: CKMB, poc: 2 ng/mL (ref 1.0–8.0)

## 2011-02-11 LAB — PHOSPHORUS: Phosphorus: 4.4 mg/dL (ref 2.3–4.6)

## 2011-02-11 LAB — HEMOGLOBIN A1C: Hgb A1c MFr Bld: 9.3 % — ABNORMAL HIGH (ref <5.7)

## 2011-02-12 LAB — GLUCOSE, CAPILLARY
Glucose-Capillary: 197 mg/dL — ABNORMAL HIGH (ref 70–99)
Glucose-Capillary: 225 mg/dL — ABNORMAL HIGH (ref 70–99)
Glucose-Capillary: 252 mg/dL — ABNORMAL HIGH (ref 70–99)
Glucose-Capillary: 299 mg/dL — ABNORMAL HIGH (ref 70–99)

## 2011-02-12 LAB — RAPID URINE DRUG SCREEN, HOSP PERFORMED
Amphetamines: NOT DETECTED
Benzodiazepines: NOT DETECTED
Tetrahydrocannabinol: NOT DETECTED

## 2011-02-12 LAB — URINALYSIS, ROUTINE W REFLEX MICROSCOPIC
Bilirubin Urine: NEGATIVE
Hgb urine dipstick: NEGATIVE
Protein, ur: NEGATIVE mg/dL
Urobilinogen, UA: 0.2 mg/dL (ref 0.0–1.0)

## 2011-02-12 LAB — POCT CARDIAC MARKERS: Myoglobin, poc: 87.4 ng/mL (ref 12–200)

## 2011-02-12 LAB — BASIC METABOLIC PANEL
BUN: 29 mg/dL — ABNORMAL HIGH (ref 6–23)
BUN: 30 mg/dL — ABNORMAL HIGH (ref 6–23)
CO2: 24 mEq/L (ref 19–32)
Calcium: 9.1 mg/dL (ref 8.4–10.5)
Calcium: 9.2 mg/dL (ref 8.4–10.5)
Calcium: 9.6 mg/dL (ref 8.4–10.5)
Creatinine, Ser: 1.6 mg/dL — ABNORMAL HIGH (ref 0.4–1.5)
Creatinine, Ser: 1.62 mg/dL — ABNORMAL HIGH (ref 0.4–1.5)
Creatinine, Ser: 1.71 mg/dL — ABNORMAL HIGH (ref 0.4–1.5)
GFR calc Af Amer: 56 mL/min — ABNORMAL LOW (ref >60)
GFR calc non Af Amer: 45 mL/min — ABNORMAL LOW (ref >60)
GFR calc non Af Amer: 46 mL/min — ABNORMAL LOW (ref >60)
Glucose, Bld: 233 mg/dL — ABNORMAL HIGH (ref 70–99)
Glucose, Bld: 376 mg/dL — ABNORMAL HIGH (ref 70–99)
Glucose, Bld: 410 mg/dL — ABNORMAL HIGH (ref 70–99)
Potassium: 4.9 mEq/L (ref 3.5–5.1)
Sodium: 138 mEq/L (ref 135–145)

## 2011-02-12 LAB — CARDIAC PANEL(CRET KIN+CKTOT+MB+TROPI)
CK, MB: 2.7 ng/mL (ref 0.3–4.0)
Relative Index: 2 (ref 0.0–2.5)
Total CK: 129 U/L (ref 7–232)
Total CK: 132 U/L (ref 7–232)

## 2011-02-12 LAB — CK TOTAL AND CKMB (NOT AT ARMC)
CK, MB: 1.9 ng/mL (ref 0.3–4.0)
Relative Index: 1.4 (ref 0.0–2.5)

## 2011-02-12 LAB — DIFFERENTIAL
Basophils Absolute: 0 10*3/uL (ref 0.0–0.1)
Basophils Relative: 0 % (ref 0–1)
Monocytes Absolute: 0.5 10*3/uL (ref 0.1–1.0)
Neutro Abs: 3.6 10*3/uL (ref 1.7–7.7)
Neutrophils Relative %: 46 % (ref 43–77)

## 2011-02-12 LAB — HEMOGLOBIN A1C
Hgb A1c MFr Bld: 9.8 % — ABNORMAL HIGH (ref 4.6–6.1)
Mean Plasma Glucose: 235 mg/dL

## 2011-02-12 LAB — CBC
Hemoglobin: 13.9 g/dL (ref 13.0–17.0)
MCHC: 33.5 g/dL (ref 30.0–36.0)
Platelets: 173 10*3/uL (ref 150–400)
RDW: 14.7 % (ref 11.5–15.5)

## 2011-02-12 LAB — BLOOD GAS, ARTERIAL
Acid-Base Excess: 0.6 mmol/L (ref 0.0–2.0)
Bicarbonate: 24.3 mEq/L — ABNORMAL HIGH (ref 20.0–24.0)
Bicarbonate: 25.3 mEq/L — ABNORMAL HIGH (ref 20.0–24.0)
O2 Saturation: 83.3 %
Patient temperature: 98.6
TCO2: 21.5 mmol/L (ref 0–100)
TCO2: 22.5 mmol/L (ref 0–100)
pCO2 arterial: 37.7 mmHg (ref 35.0–45.0)
pH, Arterial: 7.425 (ref 7.350–7.450)
pO2, Arterial: 77.2 mmHg — ABNORMAL LOW (ref 80.0–100.0)

## 2011-02-12 LAB — HEPATIC FUNCTION PANEL
AST: 29 U/L (ref 0–37)
Albumin: 3.5 g/dL (ref 3.5–5.2)
Bilirubin, Direct: 0.1 mg/dL (ref 0.0–0.3)

## 2011-02-12 LAB — TROPONIN I: Troponin I: 0.03 ng/mL (ref 0.00–0.06)

## 2011-02-12 LAB — URINE MICROSCOPIC-ADD ON: Urine-Other: NONE SEEN

## 2011-02-17 LAB — BASIC METABOLIC PANEL
Calcium: 9.5 mg/dL (ref 8.4–10.5)
GFR calc Af Amer: 59 mL/min — ABNORMAL LOW (ref >60)
GFR calc non Af Amer: 49 mL/min — ABNORMAL LOW (ref >60)
Glucose, Bld: 296 mg/dL — ABNORMAL HIGH (ref 70–99)
Potassium: 4.7 mEq/L (ref 3.5–5.1)
Sodium: 134 mEq/L — ABNORMAL LOW (ref 135–145)

## 2011-02-17 LAB — URINALYSIS, ROUTINE W REFLEX MICROSCOPIC
Leukocytes, UA: NEGATIVE
Nitrite: NEGATIVE
Specific Gravity, Urine: 1.03 (ref 1.005–1.030)
pH: 5 (ref 5.0–8.0)

## 2011-02-17 LAB — GLUCOSE, CAPILLARY
Glucose-Capillary: 255 mg/dL — ABNORMAL HIGH (ref 70–99)
Glucose-Capillary: 351 mg/dL — ABNORMAL HIGH (ref 70–99)
Glucose-Capillary: 358 mg/dL — ABNORMAL HIGH (ref 70–99)

## 2011-02-17 LAB — HEMOGLOBIN A1C
Hgb A1c MFr Bld: 9.8 % — ABNORMAL HIGH (ref 4.6–6.1)
Mean Plasma Glucose: 235 mg/dL

## 2011-02-17 LAB — DIFFERENTIAL
Basophils Relative: 1 % (ref 0–1)
Eosinophils Absolute: 1.2 10*3/uL — ABNORMAL HIGH (ref 0.0–0.7)
Monocytes Relative: 5 % (ref 3–12)
Neutrophils Relative %: 50 % (ref 43–77)

## 2011-02-17 LAB — CBC
MCHC: 32.6 g/dL (ref 30.0–36.0)
MCV: 81.8 fL (ref 78.0–100.0)
RBC: 5.59 MIL/uL (ref 4.22–5.81)
RDW: 15.1 % (ref 11.5–15.5)

## 2011-02-17 LAB — BLOOD GAS, ARTERIAL
Bicarbonate: 19 mEq/L — ABNORMAL LOW (ref 20.0–24.0)
O2 Saturation: 96.4 %
Patient temperature: 98.6
TCO2: 16.3 mmol/L (ref 0–100)

## 2011-02-17 LAB — URINE MICROSCOPIC-ADD ON

## 2011-02-26 LAB — CK TOTAL AND CKMB (NOT AT ARMC)
CK, MB: 1.9 ng/mL (ref 0.3–4.0)
Relative Index: 0.9 (ref 0.0–2.5)
Total CK: 201 U/L (ref 7–232)
Total CK: 257 U/L — ABNORMAL HIGH (ref 7–232)

## 2011-02-26 LAB — POCT I-STAT, CHEM 8
BUN: 26 mg/dL — ABNORMAL HIGH (ref 6–23)
Calcium, Ion: 1.27 mmol/L (ref 1.12–1.32)
Chloride: 105 mEq/L (ref 96–112)
Potassium: 4.3 mEq/L (ref 3.5–5.1)

## 2011-02-26 LAB — GLUCOSE, CAPILLARY
Glucose-Capillary: 182 mg/dL — ABNORMAL HIGH (ref 70–99)
Glucose-Capillary: 256 mg/dL — ABNORMAL HIGH (ref 70–99)
Glucose-Capillary: 288 mg/dL — ABNORMAL HIGH (ref 70–99)
Glucose-Capillary: 55 mg/dL — ABNORMAL LOW (ref 70–99)

## 2011-02-26 LAB — CBC
HCT: 40.1 % (ref 39.0–52.0)
Hemoglobin: 14 g/dL (ref 13.0–17.0)
MCHC: 34 g/dL (ref 30.0–36.0)
MCV: 80.5 fL (ref 78.0–100.0)
MCV: 80.7 fL (ref 78.0–100.0)
Platelets: 160 10*3/uL (ref 150–400)
Platelets: 162 10*3/uL (ref 150–400)
RBC: 5.02 MIL/uL (ref 4.22–5.81)
RBC: 5.1 MIL/uL (ref 4.22–5.81)
RDW: 14 % (ref 11.5–15.5)
RDW: 14.4 % (ref 11.5–15.5)
WBC: 7.6 10*3/uL (ref 4.0–10.5)

## 2011-02-26 LAB — BASIC METABOLIC PANEL
BUN: 13 mg/dL (ref 6–23)
CO2: 25 mEq/L (ref 19–32)
CO2: 27 mEq/L (ref 19–32)
GFR calc non Af Amer: 58 mL/min — ABNORMAL LOW (ref >60)
Glucose, Bld: 169 mg/dL — ABNORMAL HIGH (ref 70–99)
Glucose, Bld: 264 mg/dL — ABNORMAL HIGH (ref 70–99)
Potassium: 3.8 mEq/L (ref 3.5–5.1)
Potassium: 4 mEq/L (ref 3.5–5.1)
Sodium: 139 mEq/L (ref 135–145)

## 2011-02-26 LAB — LIPID PANEL
Cholesterol: 140 mg/dL (ref 0–200)
HDL: 31 mg/dL — ABNORMAL LOW (ref >39)
LDL Cholesterol: 75 mg/dL (ref 0–99)
Total CHOL/HDL Ratio: 4.5 RATIO
Triglycerides: 170 mg/dL — ABNORMAL HIGH (ref <150)

## 2011-02-26 LAB — POCT CARDIAC MARKERS
CKMB, poc: 1.2 ng/mL (ref 1.0–8.0)
Troponin i, poc: <0.05 ng/mL (ref 0.00–0.09)
Troponin i, poc: <0.05 ng/mL (ref 0.00–0.09)

## 2011-02-26 LAB — CARDIAC PANEL(CRET KIN+CKTOT+MB+TROPI)
CK, MB: 2 ng/mL (ref 0.3–4.0)
Relative Index: 1.2 (ref 0.0–2.5)
Relative Index: 1.4 (ref 0.0–2.5)
Total CK: 185 U/L (ref 7–232)
Troponin I: 0.03 ng/mL (ref 0.00–0.06)

## 2011-02-26 LAB — RAPID URINE DRUG SCREEN, HOSP PERFORMED
Amphetamines: NOT DETECTED
Cocaine: NOT DETECTED
Opiates: NOT DETECTED
Tetrahydrocannabinol: NOT DETECTED

## 2011-02-26 LAB — HEMOGLOBIN A1C
Hgb A1c MFr Bld: 9.8 % — ABNORMAL HIGH (ref 4.6–6.1)
Mean Plasma Glucose: 235 mg/dL

## 2011-02-28 LAB — CBC
HCT: 43.1 % (ref 39.0–52.0)
HCT: 43.3 % (ref 39.0–52.0)
Hemoglobin: 14.7 g/dL (ref 13.0–17.0)
Hemoglobin: 14.8 g/dL (ref 13.0–17.0)
MCHC: 34.1 g/dL (ref 30.0–36.0)
MCHC: 34.1 g/dL (ref 30.0–36.0)
MCV: 80.5 fL (ref 78.0–100.0)
MCV: 80.9 fL (ref 78.0–100.0)
Platelets: 208 10*3/uL (ref 150–400)
RBC: 5.33 MIL/uL (ref 4.22–5.81)
RBC: 5.38 MIL/uL (ref 4.22–5.81)
RDW: 14.7 % (ref 11.5–15.5)
WBC: 13.4 10*3/uL — ABNORMAL HIGH (ref 4.0–10.5)
WBC: 6.4 10*3/uL (ref 4.0–10.5)

## 2011-02-28 LAB — GLUCOSE, CAPILLARY
Glucose-Capillary: 218 mg/dL — ABNORMAL HIGH (ref 70–99)
Glucose-Capillary: 256 mg/dL — ABNORMAL HIGH (ref 70–99)
Glucose-Capillary: 304 mg/dL — ABNORMAL HIGH (ref 70–99)
Glucose-Capillary: 336 mg/dL — ABNORMAL HIGH (ref 70–99)

## 2011-02-28 LAB — CARDIAC PANEL(CRET KIN+CKTOT+MB+TROPI)
CK, MB: 5 ng/mL — ABNORMAL HIGH (ref 0.3–4.0)
Relative Index: 4.2 — ABNORMAL HIGH (ref 0.0–2.5)
Total CK: 120 U/L (ref 7–232)

## 2011-02-28 LAB — LIPID PANEL
HDL: 46 mg/dL
Total CHOL/HDL Ratio: 3.7 ratio
Triglycerides: 56 mg/dL
VLDL: 11 mg/dL (ref 0–40)

## 2011-02-28 LAB — POCT CARDIAC MARKERS
CKMB, poc: 3.1 ng/mL (ref 1.0–8.0)
Myoglobin, poc: 137 ng/mL (ref 12–200)

## 2011-02-28 LAB — URINALYSIS, ROUTINE W REFLEX MICROSCOPIC
Glucose, UA: >1000 mg/dL — AB
Leukocytes, UA: NEGATIVE
pH: 5 (ref 5.0–8.0)

## 2011-02-28 LAB — URINE MICROSCOPIC-ADD ON: Urine-Other: NONE SEEN

## 2011-02-28 LAB — BASIC METABOLIC PANEL
CO2: 22 mEq/L (ref 19–32)
CO2: 24 mEq/L (ref 19–32)
Calcium: 9.2 mg/dL (ref 8.4–10.5)
Chloride: 106 mEq/L (ref 96–112)
Creatinine, Ser: 1.23 mg/dL (ref 0.4–1.5)
GFR calc Af Amer: >60 mL/min (ref >60)
Glucose, Bld: 188 mg/dL — ABNORMAL HIGH (ref 70–99)
Potassium: 3.9 mEq/L (ref 3.5–5.1)
Sodium: 136 mEq/L (ref 135–145)

## 2011-02-28 LAB — PROTIME-INR
INR: 1 (ref 0.00–1.49)
Prothrombin Time: 13.4 s (ref 11.6–15.2)

## 2011-02-28 LAB — DIFFERENTIAL
Basophils Relative: 1 % (ref 0–1)
Eosinophils Absolute: 1.1 10*3/uL — ABNORMAL HIGH (ref 0.0–0.7)
Lymphs Abs: 2.3 10*3/uL (ref 0.7–4.0)
Monocytes Absolute: 0.5 10*3/uL (ref 0.1–1.0)
Monocytes Relative: 8 % (ref 3–12)

## 2011-02-28 LAB — HEMOGLOBIN A1C
Hgb A1c MFr Bld: 9.9 % — ABNORMAL HIGH (ref 4.6–6.1)
Mean Plasma Glucose: 237 mg/dL

## 2011-02-28 LAB — PHOSPHORUS: Phosphorus: 2.4 mg/dL (ref 2.3–4.6)

## 2011-02-28 LAB — MAGNESIUM: Magnesium: 2 mg/dL (ref 1.5–2.5)

## 2011-02-28 LAB — URINE CULTURE
Colony Count: NO GROWTH
Culture: NO GROWTH
Special Requests: NEGATIVE

## 2011-02-28 LAB — TSH: TSH: 0.666 u[IU]/mL (ref 0.350–4.500)

## 2011-03-01 LAB — DIFFERENTIAL
Basophils Relative: 1 % (ref 0–1)
Eosinophils Absolute: 1.4 10*3/uL — ABNORMAL HIGH (ref 0.0–0.7)
Lymphs Abs: 2.3 10*3/uL (ref 0.7–4.0)
Monocytes Absolute: 0.6 10*3/uL (ref 0.1–1.0)
Monocytes Relative: 8 % (ref 3–12)

## 2011-03-01 LAB — CBC
HCT: 44.2 % (ref 39.0–52.0)
Hemoglobin: 15.2 g/dL (ref 13.0–17.0)
MCHC: 34.4 g/dL (ref 30.0–36.0)
MCV: 80.1 fL (ref 78.0–100.0)
RBC: 5.52 MIL/uL (ref 4.22–5.81)
WBC: 7.5 10*3/uL (ref 4.0–10.5)

## 2011-03-01 LAB — BASIC METABOLIC PANEL
CO2: 24 mEq/L (ref 19–32)
Chloride: 104 mEq/L (ref 96–112)
GFR calc Af Amer: >60 mL/min (ref >60)
Potassium: 4.6 mEq/L (ref 3.5–5.1)
Sodium: 133 mEq/L — ABNORMAL LOW (ref 135–145)

## 2011-03-01 LAB — GLUCOSE, CAPILLARY

## 2011-03-09 ENCOUNTER — Emergency Department (HOSPITAL_COMMUNITY)
Admission: EM | Admit: 2011-03-09 | Discharge: 2011-03-09 | Disposition: A | Discharge status: Home / Self Care | Payer: Non-veteran care | Attending: Emergency Medicine | Admitting: Emergency Medicine

## 2011-03-09 ENCOUNTER — Emergency Department (HOSPITAL_COMMUNITY): Payer: Non-veteran care

## 2011-03-09 ENCOUNTER — Encounter (HOSPITAL_COMMUNITY): Payer: Self-pay | Admitting: Radiology

## 2011-03-09 DIAGNOSIS — I10 Essential (primary) hypertension: Secondary | ICD-10-CM | POA: Insufficient documentation

## 2011-03-09 DIAGNOSIS — R0789 Other chest pain: Secondary | ICD-10-CM | POA: Insufficient documentation

## 2011-03-09 DIAGNOSIS — E7800 Pure hypercholesterolemia, unspecified: Secondary | ICD-10-CM | POA: Insufficient documentation

## 2011-03-09 DIAGNOSIS — I252 Old myocardial infarction: Secondary | ICD-10-CM | POA: Insufficient documentation

## 2011-03-09 DIAGNOSIS — Z794 Long term (current) use of insulin: Secondary | ICD-10-CM | POA: Insufficient documentation

## 2011-03-09 DIAGNOSIS — F329 Major depressive disorder, single episode, unspecified: Secondary | ICD-10-CM | POA: Insufficient documentation

## 2011-03-09 DIAGNOSIS — E119 Type 2 diabetes mellitus without complications: Secondary | ICD-10-CM | POA: Insufficient documentation

## 2011-03-09 DIAGNOSIS — R0602 Shortness of breath: Secondary | ICD-10-CM | POA: Insufficient documentation

## 2011-03-09 DIAGNOSIS — J449 Chronic obstructive pulmonary disease, unspecified: Secondary | ICD-10-CM | POA: Insufficient documentation

## 2011-03-09 DIAGNOSIS — R791 Abnormal coagulation profile: Secondary | ICD-10-CM | POA: Insufficient documentation

## 2011-03-09 DIAGNOSIS — F3289 Other specified depressive episodes: Secondary | ICD-10-CM | POA: Insufficient documentation

## 2011-03-09 DIAGNOSIS — J4489 Other specified chronic obstructive pulmonary disease: Secondary | ICD-10-CM | POA: Insufficient documentation

## 2011-03-09 HISTORY — DX: Essential (primary) hypertension: I10

## 2011-03-09 LAB — DIFFERENTIAL
Eosinophils Absolute: 0.6 10*3/uL (ref 0.0–0.7)
Lymphocytes Relative: 27 % (ref 12–46)
Lymphs Abs: 1.8 10*3/uL (ref 0.7–4.0)
Monocytes Relative: 5 % (ref 3–12)
Neutro Abs: 3.9 10*3/uL (ref 1.7–7.7)
Neutrophils Relative %: 59 % (ref 43–77)

## 2011-03-09 LAB — CBC
Hemoglobin: 14.3 g/dL (ref 13.0–17.0)
MCH: 26.6 pg (ref 26.0–34.0)
MCV: 74.7 fL — ABNORMAL LOW (ref 78.0–100.0)
Platelets: 191 10*3/uL (ref 150–400)
RBC: 5.37 MIL/uL (ref 4.22–5.81)
WBC: 6.6 10*3/uL (ref 4.0–10.5)

## 2011-03-09 LAB — TROPONIN I: Troponin I: 0.05 ng/mL (ref 0.00–0.06)

## 2011-03-09 LAB — CK TOTAL AND CKMB (NOT AT ARMC)
CK, MB: 7 ng/mL (ref 0.3–4.0)
Total CK: 423 U/L — ABNORMAL HIGH (ref 7–232)

## 2011-03-09 LAB — BASIC METABOLIC PANEL
BUN: 27 mg/dL — ABNORMAL HIGH (ref 6–23)
CO2: 23 mEq/L (ref 19–32)
Chloride: 106 mEq/L (ref 96–112)
Potassium: 4.5 mEq/L (ref 3.5–5.1)

## 2011-03-09 LAB — GLUCOSE, CAPILLARY: Glucose-Capillary: 222 mg/dL — ABNORMAL HIGH (ref 70–99)

## 2011-03-09 LAB — D-DIMER, QUANTITATIVE: D-Dimer, Quant: 0.52 ug/mL-FEU — ABNORMAL HIGH (ref 0.00–0.48)

## 2011-03-09 MED ORDER — IOHEXOL 300 MG/ML  SOLN
100.0000 mL | Freq: Once | INTRAMUSCULAR | Status: AC | PRN
  Administered 2011-03-09: 100 mL via INTRAVENOUS

## 2011-03-20 ENCOUNTER — Emergency Department (HOSPITAL_COMMUNITY)
Admission: EM | Admit: 2011-03-20 | Discharge: 2011-03-20 | Disposition: A | Discharge status: Home / Self Care | Payer: Non-veteran care | Attending: Emergency Medicine | Admitting: Emergency Medicine

## 2011-03-20 ENCOUNTER — Emergency Department (HOSPITAL_COMMUNITY): Payer: Non-veteran care

## 2011-03-20 DIAGNOSIS — R079 Chest pain, unspecified: Secondary | ICD-10-CM | POA: Insufficient documentation

## 2011-03-20 DIAGNOSIS — Z794 Long term (current) use of insulin: Secondary | ICD-10-CM | POA: Insufficient documentation

## 2011-03-20 DIAGNOSIS — R0609 Other forms of dyspnea: Secondary | ICD-10-CM | POA: Insufficient documentation

## 2011-03-20 DIAGNOSIS — I10 Essential (primary) hypertension: Secondary | ICD-10-CM | POA: Insufficient documentation

## 2011-03-20 DIAGNOSIS — R0989 Other specified symptoms and signs involving the circulatory and respiratory systems: Secondary | ICD-10-CM | POA: Insufficient documentation

## 2011-03-20 DIAGNOSIS — E119 Type 2 diabetes mellitus without complications: Secondary | ICD-10-CM | POA: Insufficient documentation

## 2011-03-20 DIAGNOSIS — I252 Old myocardial infarction: Secondary | ICD-10-CM | POA: Insufficient documentation

## 2011-03-20 LAB — POCT CARDIAC MARKERS
Myoglobin, poc: 80.6 ng/mL (ref 12–200)
Troponin i, poc: <0.05 ng/mL (ref 0.00–0.09)

## 2011-03-20 LAB — POCT I-STAT, CHEM 8
Creatinine, Ser: 1.7 mg/dL — ABNORMAL HIGH (ref 0.4–1.5)
Glucose, Bld: 214 mg/dL — ABNORMAL HIGH (ref 70–99)
HCT: 45 % (ref 39.0–52.0)
Hemoglobin: 15.3 g/dL (ref 13.0–17.0)
Sodium: 135 mEq/L (ref 135–145)
TCO2: 20 mmol/L (ref 0–100)

## 2012-01-25 IMAGING — CR DG CHEST 1V PORT
1 series · 1 of 1 positions shown · non-contrast
Comparison: None.

CLINICAL DATA: Asthma.  Shortness of breath.

CHEST - 1 VIEW

[view not recorded]
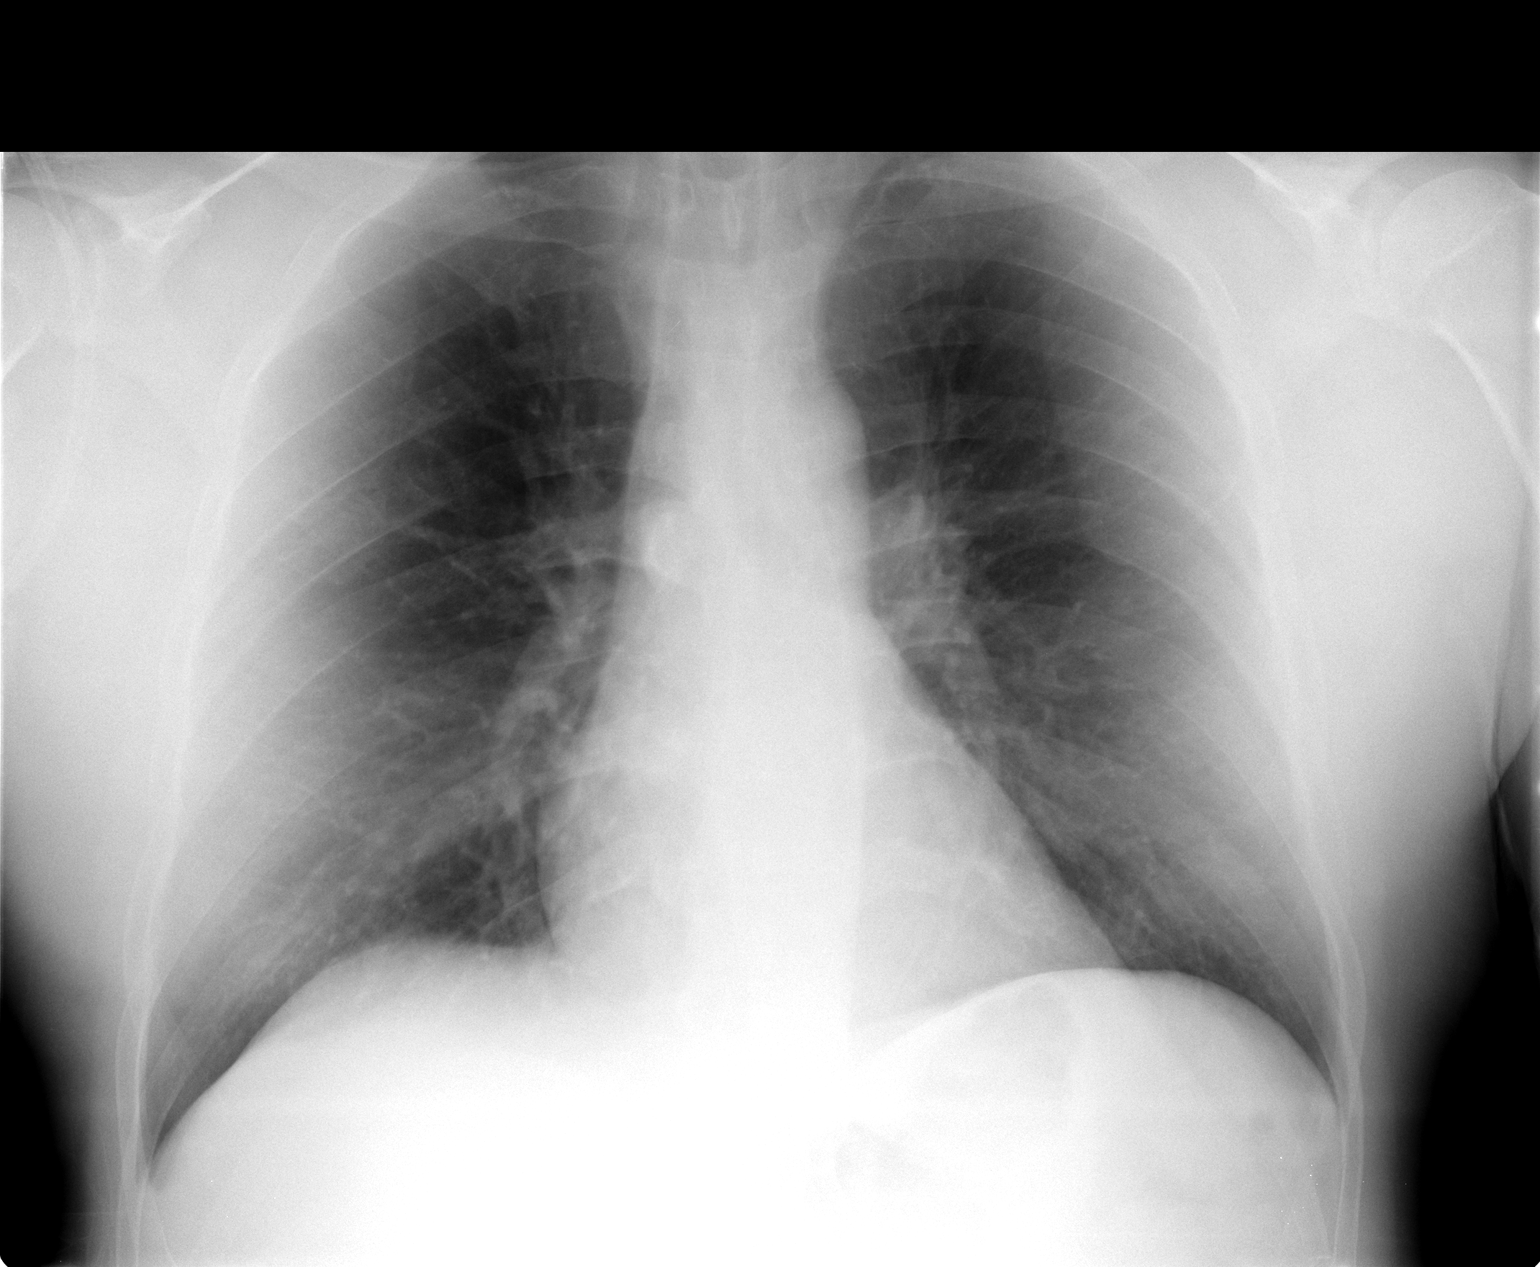

[1 of 1 positions shown; findings below may reference images not displayed]

FINDINGS: The heart size and mediastinal contours are within normal
limits.  Both lungs are clear.  Grossly intact bony thorax.
IMPRESSION: No active disease.

## 2012-07-05 ENCOUNTER — Encounter (HOSPITAL_BASED_OUTPATIENT_CLINIC_OR_DEPARTMENT_OTHER): Payer: Self-pay | Admitting: *Deleted

## 2012-07-05 ENCOUNTER — Emergency Department (HOSPITAL_BASED_OUTPATIENT_CLINIC_OR_DEPARTMENT_OTHER)
Admission: EM | Admit: 2012-07-05 | Discharge: 2012-07-05 | Disposition: A | Discharge status: Home / Self Care | Payer: Non-veteran care | Attending: Emergency Medicine | Admitting: Emergency Medicine

## 2012-07-05 DIAGNOSIS — I2510 Atherosclerotic heart disease of native coronary artery without angina pectoris: Secondary | ICD-10-CM | POA: Insufficient documentation

## 2012-07-05 DIAGNOSIS — Z79899 Other long term (current) drug therapy: Secondary | ICD-10-CM | POA: Insufficient documentation

## 2012-07-05 DIAGNOSIS — Z794 Long term (current) use of insulin: Secondary | ICD-10-CM | POA: Insufficient documentation

## 2012-07-05 DIAGNOSIS — R110 Nausea: Secondary | ICD-10-CM

## 2012-07-05 DIAGNOSIS — R252 Cramp and spasm: Secondary | ICD-10-CM

## 2012-07-05 DIAGNOSIS — R109 Unspecified abdominal pain: Secondary | ICD-10-CM | POA: Insufficient documentation

## 2012-07-05 DIAGNOSIS — E119 Type 2 diabetes mellitus without complications: Secondary | ICD-10-CM | POA: Insufficient documentation

## 2012-07-05 DIAGNOSIS — I10 Essential (primary) hypertension: Secondary | ICD-10-CM | POA: Insufficient documentation

## 2012-07-05 DIAGNOSIS — J45909 Unspecified asthma, uncomplicated: Secondary | ICD-10-CM | POA: Insufficient documentation

## 2012-07-05 LAB — COMPREHENSIVE METABOLIC PANEL
AST: 37 U/L (ref 0–37)
Albumin: 3.8 g/dL (ref 3.5–5.2)
Chloride: 96 mEq/L (ref 96–112)
Creatinine, Ser: 1.4 mg/dL — ABNORMAL HIGH (ref 0.50–1.35)
Total Bilirubin: 0.2 mg/dL — ABNORMAL LOW (ref 0.3–1.2)

## 2012-07-05 LAB — URINALYSIS, ROUTINE W REFLEX MICROSCOPIC
Ketones, ur: NEGATIVE mg/dL
Leukocytes, UA: NEGATIVE
Nitrite: NEGATIVE
Protein, ur: NEGATIVE mg/dL
Urobilinogen, UA: 0.2 mg/dL (ref 0.0–1.0)

## 2012-07-05 LAB — CBC WITH DIFFERENTIAL/PLATELET
Basophils Absolute: 0 10*3/uL (ref 0.0–0.1)
Eosinophils Absolute: 0.3 10*3/uL (ref 0.0–0.7)
HCT: 37.7 % — ABNORMAL LOW (ref 39.0–52.0)
Lymphocytes Relative: 40 % (ref 12–46)
MCHC: 37.1 g/dL — ABNORMAL HIGH (ref 30.0–36.0)
Neutro Abs: 3.7 10*3/uL (ref 1.7–7.7)
Neutrophils Relative %: 49 % (ref 43–77)
Platelets: 158 10*3/uL (ref 150–400)
RDW: 13.6 % (ref 11.5–15.5)

## 2012-07-05 MED ORDER — SODIUM CHLORIDE 0.9 % IV BOLUS (SEPSIS)
1000.0000 mL | Freq: Once | INTRAVENOUS | Status: AC
  Administered 2012-07-05: 1000 mL via INTRAVENOUS

## 2012-07-05 MED ORDER — ONDANSETRON HCL 4 MG/2ML IJ SOLN
4.0000 mg | Freq: Once | INTRAMUSCULAR | Status: AC
  Administered 2012-07-05: 4 mg via INTRAVENOUS
  Filled 2012-07-05: qty 2

## 2012-07-05 NOTE — ED Provider Notes (Signed)
History  This chart was scribed for Chablis Losh B. Bernette Mayers, MD by Ladona Ridgel Day. This patient was seen in room MH10/MH10   CSN: [id number]  Arrival date & time 07/05/12  1734   First MD Initiated Contact with Patient 07/05/12 1918      Chief Complaint  Patient presents with  . Abdominal Pain    The history is provided by the patient. No language interpreter was used.   Jose [name] is a 51 y.o. male who presents to the Emergency Department complaining of gradually worsening cramping in his hands bilaterally for 7 days. He states that this is a new problem for him. He also states nausea and HA as associated symptoms. He also states has been having "light colored" stool but denies any diarrhea. He also denies emesis, fever, and urinary symptoms. 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 He denies drugs, ETOH and smoking. He denies any other injures or illnesses.   Past Medical History  Diagnosis Date  . Diabetes mellitus   . Hypertension   . Asthma   . Pancreatitis   . Coronary artery disease   . Depressed     Past Surgical History  Procedure Date  . Cardiac catheterization     History reviewed. No pertinent family history.  History  Substance Use Topics  . Smoking status: Never Smoker   . Smokeless tobacco: Not on file  . Alcohol Use: No      Review of Systems A complete 10 system review of systems was obtained and all systems are negative except as noted in the HPI and PMH.   Allergies  Review of patient's allergies indicates no known allergies.  Home Medications   Current Outpatient Rx  Name Route Sig Dispense Refill  . ALBUTEROL SULFATE HFA 108 (90 BASE) MCG/ACT IN AERS Inhalation Inhale 2 puffs into the lungs every 6 (six) hours as needed. For wheezing and shortness of breath.    . ASPIRIN 81 MG PO TABS Oral Take 81 mg by mouth daily.    Marland Kitchen BENAZEPRIL-HYDROCHLOROTHIAZIDE 10-12.5 MG PO TABS Oral Take 1 tablet by  mouth daily.    . BUDESONIDE-FORMOTEROL FUMARATE 160-4.5 MCG/ACT IN AERO Inhalation Inhale 2 puffs into the lungs 2 (two) times daily.    Marland Kitchen CITALOPRAM HYDROBROMIDE 40 MG PO TABS Oral Take 40 mg by mouth daily.    Marland Kitchen CLOPIDOGREL BISULFATE 75 MG PO TABS Oral Take 75 mg by mouth daily.    Marland Kitchen FERROUS GLUCONATE 325 MG PO TABS Oral Take 325 mg by mouth daily with breakfast.    . IBUPROFEN 200 MG PO TABS Oral Take 400 mg by mouth every 6 (six) hours as needed. For headache.    . IBUPROFEN 800 MG PO TABS Oral Take 800 mg by mouth every 8 (eight) hours as needed.    . INSULIN GLARGINE 100 UNIT/ML [place] SOLN Subcutaneous Inject 33 Units into the skin at bedtime.    Marland Kitchen LATANOPROST 0.005 % OP SOLN Both Eyes Place 1 drop into both eyes at bedtime.    . METHYLCELLULOSE 1 % OP SOLN Both Eyes Place 1 drop into both eyes as needed.    Marland Kitchen METOPROLOL TARTRATE 50 MG PO TABS Oral Take 50 mg by mouth 2 (two) times daily.    Marland Kitchen ONE-DAILY MULTI VITAMINS PO TABS Oral Take 1 tablet by mouth daily.    Marland Kitchen NAPROXEN SODIUM 220 MG PO TABS Oral Take 440 mg by mouth 2 (two)  times daily with a meal. For pain.    Marland Kitchen NITROGLYCERIN 0.4 MG SL SUBL Sublingual Place 0.4 mg under the tongue every 5 (five) minutes as needed.    Marland Kitchen QUETIAPINE FUMARATE 100 MG PO TABS Oral Take 100 mg by mouth at bedtime.    Marland Kitchen ROSUVASTATIN CALCIUM 40 MG PO TABS Oral Take 40 mg by mouth daily.    . TRAZODONE HCL 100 MG PO TABS Oral Take 100 mg by mouth at bedtime.      Triage Vitals: BP 133/75  Pulse 65  Temp 98.2 F (36.8 C) (Oral)  Resp 16  Ht 5\' 11"  (1.803 m)  Wt 173 lb (78.472 kg)  BMI 24.13 kg/m2  SpO2 96%  Physical Exam  Nursing note and vitals reviewed. Constitutional: He is oriented to person, place, and time. He appears well-developed and well-nourished.  HENT:  Head: Normocephalic and atraumatic.  Eyes: EOM are normal. Pupils are equal, round, and reactive to light.  Neck: Normal range of motion. Neck supple.  Cardiovascular: Normal rate,  normal heart sounds and intact distal pulses.   Pulmonary/Chest: Effort normal and breath sounds normal.  Abdominal: Bowel sounds are normal. He exhibits no distension. There is no tenderness.  Musculoskeletal: Normal range of motion. He exhibits no edema and no tenderness.  Neurological: He is alert and oriented to person, place, and time. He has normal strength. No cranial nerve deficit or sensory deficit.  Skin: Skin is warm and dry. No rash noted.  Psychiatric: He has a normal mood and affect.    ED Course  Procedures (including critical care time) DIAGNOSTIC STUDIES: Oxygen Saturation is 96% on room air, normal by my interpretation.    COORDINATION OF CARE: At 725 PM Discussed treatment plan with patient which includes blood work, IV fluids and urine analysis. Patient agrees.   Labs Reviewed  CBC WITH DIFFERENTIAL - Abnormal; Notable for the following:    HCT 37.7 (*)     MCV 74.7 (*)     MCHC 37.1 (*)  RULED OUT INTERFERING SUBSTANCES   All other components within normal limits  COMPREHENSIVE METABOLIC PANEL - Abnormal; Notable for the following:    Sodium 131 (*)     Glucose, Bld 195 (*)     BUN 27 (*)     Creatinine, Ser 1.40 (*)     Total Bilirubin 0.2 (*)     GFR calc non Af Amer 56 (*)     GFR calc Af Amer 65 (*)     All other components within normal limits  CK - Abnormal; Notable for the following:    Total CK 464 (*)     All other components within normal limits  URINALYSIS, ROUTINE W REFLEX MICROSCOPIC  LIPASE, BLOOD   No results found.   No diagnosis found.    MDM  I personally performed the services described in the documentation, which were scribed in my presence. The recorded information has been reviewed and considered.   Pt has mildly elevated CK consistent with previous lab values, but could represent mild rhabdo from statin. Advised to discussed this with his PCP and stop Statin to see if his cramping improves. Nausea has resolved.         Alacia Rehmann B. Bernette Mayers, MD 07/05/12 2054

## 2012-07-05 NOTE — ED Notes (Signed)
Pt c/o abd pain and nausea . Pt also c/o body cramps

## 2012-08-25 ENCOUNTER — Emergency Department (HOSPITAL_COMMUNITY): Payer: Non-veteran care

## 2012-08-25 ENCOUNTER — Observation Stay (HOSPITAL_COMMUNITY)
Admission: EM | Admit: 2012-08-25 | Discharge: 2012-08-26 | Disposition: A | Discharge status: Home / Self Care | Payer: Non-veteran care | Attending: Cardiovascular Disease | Admitting: Cardiovascular Disease

## 2012-08-25 ENCOUNTER — Encounter (HOSPITAL_COMMUNITY)
Admission: EM | Disposition: A | Discharge status: Home / Self Care | Payer: Self-pay | Source: Home / Self Care | Attending: Emergency Medicine

## 2012-08-25 ENCOUNTER — Encounter (HOSPITAL_COMMUNITY): Payer: Self-pay | Admitting: *Deleted

## 2012-08-25 DIAGNOSIS — K861 Other chronic pancreatitis: Secondary | ICD-10-CM | POA: Insufficient documentation

## 2012-08-25 DIAGNOSIS — N183 Chronic kidney disease, stage 3 unspecified: Secondary | ICD-10-CM | POA: Insufficient documentation

## 2012-08-25 DIAGNOSIS — Z23 Encounter for immunization: Secondary | ICD-10-CM | POA: Insufficient documentation

## 2012-08-25 DIAGNOSIS — R0602 Shortness of breath: Secondary | ICD-10-CM | POA: Insufficient documentation

## 2012-08-25 DIAGNOSIS — E119 Type 2 diabetes mellitus without complications: Secondary | ICD-10-CM | POA: Insufficient documentation

## 2012-08-25 DIAGNOSIS — I200 Unstable angina: Secondary | ICD-10-CM | POA: Insufficient documentation

## 2012-08-25 DIAGNOSIS — I2510 Atherosclerotic heart disease of native coronary artery without angina pectoris: Principal | ICD-10-CM | POA: Insufficient documentation

## 2012-08-25 DIAGNOSIS — I129 Hypertensive chronic kidney disease with stage 1 through stage 4 chronic kidney disease, or unspecified chronic kidney disease: Secondary | ICD-10-CM | POA: Insufficient documentation

## 2012-08-25 DIAGNOSIS — J45909 Unspecified asthma, uncomplicated: Secondary | ICD-10-CM | POA: Insufficient documentation

## 2012-08-25 DIAGNOSIS — R079 Chest pain, unspecified: Secondary | ICD-10-CM | POA: Insufficient documentation

## 2012-08-25 DIAGNOSIS — Z9861 Coronary angioplasty status: Secondary | ICD-10-CM | POA: Insufficient documentation

## 2012-08-25 DIAGNOSIS — I10 Essential (primary) hypertension: Secondary | ICD-10-CM

## 2012-08-25 DIAGNOSIS — E785 Hyperlipidemia, unspecified: Secondary | ICD-10-CM | POA: Insufficient documentation

## 2012-08-25 HISTORY — PX: LEFT HEART CATHETERIZATION WITH CORONARY ANGIOGRAM: SHX5451

## 2012-08-25 LAB — BASIC METABOLIC PANEL
BUN: 23 mg/dL (ref 6–23)
Calcium: 10 mg/dL (ref 8.4–10.5)
Creatinine, Ser: 1.37 mg/dL — ABNORMAL HIGH (ref 0.50–1.35)
GFR calc non Af Amer: 57 mL/min — ABNORMAL LOW (ref >90)
Glucose, Bld: 314 mg/dL — ABNORMAL HIGH (ref 70–99)
Sodium: 134 mEq/L — ABNORMAL LOW (ref 135–145)

## 2012-08-25 LAB — GLUCOSE, CAPILLARY
Glucose-Capillary: 56 mg/dL — ABNORMAL LOW (ref 70–99)
Glucose-Capillary: 172 mg/dL — ABNORMAL HIGH (ref 70–99)
Glucose-Capillary: 261 mg/dL — ABNORMAL HIGH (ref 70–99)
Glucose-Capillary: 171 mg/dL — ABNORMAL HIGH (ref 70–99)

## 2012-08-25 LAB — CK TOTAL AND CKMB (NOT AT ARMC)
CK, MB: 8.1 ng/mL (ref 0.3–4.0)
Relative Index: 1.5 (ref 0.0–2.5)

## 2012-08-25 LAB — CBC
HCT: 42 % (ref 39.0–52.0)
Hemoglobin: 15.4 g/dL (ref 13.0–17.0)
MCH: 28.4 pg (ref 26.0–34.0)
MCHC: 36.7 g/dL — ABNORMAL HIGH (ref 30.0–36.0)
MCV: 77.5 fL — ABNORMAL LOW (ref 78.0–100.0)

## 2012-08-25 LAB — PROTIME-INR: Prothrombin Time: 12.3 seconds (ref 11.6–15.2)

## 2012-08-25 LAB — HEMOGLOBIN A1C: Hgb A1c MFr Bld: 8.2 % — ABNORMAL HIGH (ref <5.7)

## 2012-08-25 SURGERY — LEFT HEART CATHETERIZATION WITH CORONARY ANGIOGRAM
Anesthesia: LOCAL | Laterality: Bilateral

## 2012-08-25 MED ORDER — BENAZEPRIL-HYDROCHLOROTHIAZIDE 10-12.5 MG PO TABS: 1.0000 | ORAL_TABLET | Freq: Every day | ORAL | Status: DC

## 2012-08-25 MED ORDER — ASPIRIN 81 MG PO CHEW
81.0000 mg | CHEWABLE_TABLET | Freq: Once | ORAL | Status: AC
  Administered 2012-08-25: 81 mg via ORAL
  Filled 2012-08-25: qty 1

## 2012-08-25 MED ORDER — QUETIAPINE FUMARATE 100 MG PO TABS
100.0000 mg | ORAL_TABLET | Freq: Every day | ORAL | Status: DC
  Administered 2012-08-25: 100 mg via ORAL
  Filled 2012-08-25 (×2): qty 1

## 2012-08-25 MED ORDER — DEXTROSE 50 % IV SOLN: 25.0000 mL | Freq: Once | INTRAVENOUS | Status: AC | PRN

## 2012-08-25 MED ORDER — TRAZODONE HCL 100 MG PO TABS
100.0000 mg | ORAL_TABLET | Freq: Every day | ORAL | Status: DC
  Administered 2012-08-25: 100 mg via ORAL
  Filled 2012-08-25 (×2): qty 1

## 2012-08-25 MED ORDER — BENAZEPRIL HCL 10 MG PO TABS
10.0000 mg | ORAL_TABLET | Freq: Every day | ORAL | Status: DC
  Administered 2012-08-26: 09:00:00 10 mg via ORAL
  Filled 2012-08-25 (×2): qty 1

## 2012-08-25 MED ORDER — ADULT MULTIVITAMIN W/MINERALS CH
1.0000 | ORAL_TABLET | Freq: Every day | ORAL | Status: DC
  Administered 2012-08-26: 1 via ORAL
  Filled 2012-08-25: qty 1

## 2012-08-25 MED ORDER — INSULIN ASPART 100 UNIT/ML ~~LOC~~ SOLN
0.0000 [IU] | Freq: Three times a day (TID) | SUBCUTANEOUS | Status: DC
  Administered 2012-08-25: 18:00:00 8 [IU] via SUBCUTANEOUS
  Administered 2012-08-26: 09:00:00 3 [IU] via SUBCUTANEOUS

## 2012-08-25 MED ORDER — METHOCARBAMOL 500 MG PO TABS
500.0000 mg | ORAL_TABLET | Freq: Once | ORAL | Status: AC
  Administered 2012-08-25: 500 mg via ORAL
  Filled 2012-08-25: qty 1

## 2012-08-25 MED ORDER — MIDAZOLAM HCL 2 MG/2ML IJ SOLN
INTRAMUSCULAR | Status: AC
  Filled 2012-08-25: qty 2

## 2012-08-25 MED ORDER — POLYVINYL ALCOHOL 1.4 % OP SOLN
1.0000 [drp] | OPHTHALMIC | Status: DC | PRN
  Filled 2012-08-25: qty 15

## 2012-08-25 MED ORDER — ALBUTEROL SULFATE HFA 108 (90 BASE) MCG/ACT IN AERS: 2.0000 | INHALATION_SPRAY | Freq: Four times a day (QID) | RESPIRATORY_TRACT | Status: DC | PRN

## 2012-08-25 MED ORDER — METHYLCELLULOSE 1 % OP SOLN: 1.0000 [drp] | OPHTHALMIC | Status: DC | PRN

## 2012-08-25 MED ORDER — FENTANYL CITRATE 0.05 MG/ML IJ SOLN
INTRAMUSCULAR | Status: AC
  Filled 2012-08-25: qty 2

## 2012-08-25 MED ORDER — CLOPIDOGREL BISULFATE 75 MG PO TABS
75.0000 mg | ORAL_TABLET | Freq: Every day | ORAL | Status: DC
  Administered 2012-08-26: 09:00:00 75 mg via ORAL
  Filled 2012-08-25 (×2): qty 1

## 2012-08-25 MED ORDER — ASPIRIN 81 MG PO TABS: 81.0000 mg | ORAL_TABLET | Freq: Every day | ORAL | Status: DC

## 2012-08-25 MED ORDER — TRAVOPROST (BAK FREE) 0.004 % OP SOLN
1.0000 [drp] | Freq: Every day | OPHTHALMIC | Status: DC
  Administered 2012-08-25: 22:00:00 1 [drp] via OPHTHALMIC
  Filled 2012-08-25: qty 2.5

## 2012-08-25 MED ORDER — HYDROCHLOROTHIAZIDE 12.5 MG PO CAPS
12.5000 mg | ORAL_CAPSULE | Freq: Every day | ORAL | Status: DC
  Administered 2012-08-26: 09:00:00 12.5 mg via ORAL
  Filled 2012-08-25: qty 1

## 2012-08-25 MED ORDER — INSULIN GLARGINE 100 UNIT/ML ~~LOC~~ SOLN
30.0000 [IU] | Freq: Every day | SUBCUTANEOUS | Status: DC
  Administered 2012-08-26: 30 [IU] via SUBCUTANEOUS
  Filled 2012-08-25: qty 1

## 2012-08-25 MED ORDER — VERAPAMIL HCL 2.5 MG/ML IV SOLN
INTRAVENOUS | Status: AC
  Filled 2012-08-25: qty 2

## 2012-08-25 MED ORDER — SODIUM CHLORIDE 0.9 % IJ SOLN: 3.0000 mL | INTRAMUSCULAR | Status: DC | PRN

## 2012-08-25 MED ORDER — NITROGLYCERIN 0.4 MG SL SUBL: 0.4000 mg | SUBLINGUAL_TABLET | SUBLINGUAL | Status: DC | PRN

## 2012-08-25 MED ORDER — SODIUM CHLORIDE 0.9 % IJ SOLN: 3.0000 mL | Freq: Two times a day (BID) | INTRAMUSCULAR | Status: DC

## 2012-08-25 MED ORDER — ATORVASTATIN CALCIUM 40 MG PO TABS
40.0000 mg | ORAL_TABLET | Freq: Every day | ORAL | Status: DC
  Administered 2012-08-25: 40 mg via ORAL
  Filled 2012-08-25 (×2): qty 1

## 2012-08-25 MED ORDER — ONE-DAILY MULTI VITAMINS PO TABS: 1.0000 | ORAL_TABLET | Freq: Every day | ORAL | Status: DC

## 2012-08-25 MED ORDER — NITROGLYCERIN 0.2 MG/ML ON CALL CATH LAB
INTRAVENOUS | Status: AC
  Filled 2012-08-25: qty 1

## 2012-08-25 MED ORDER — ASPIRIN 81 MG PO CHEW
324.0000 mg | CHEWABLE_TABLET | ORAL | Status: AC
  Administered 2012-08-25: 324 mg via ORAL
  Filled 2012-08-25: qty 4

## 2012-08-25 MED ORDER — DEXTROSE 50 % IV SOLN
INTRAVENOUS | Status: AC
  Administered 2012-08-25: 50 mL
  Filled 2012-08-25: qty 50

## 2012-08-25 MED ORDER — DEXTROSE 5 % IV SOLN: INTRAVENOUS | Status: DC

## 2012-08-25 MED ORDER — DIAZEPAM 5 MG PO TABS
5.0000 mg | ORAL_TABLET | ORAL | Status: AC
  Administered 2012-08-25: 5 mg via ORAL
  Filled 2012-08-25 (×2): qty 1

## 2012-08-25 MED ORDER — BUDESONIDE-FORMOTEROL FUMARATE 160-4.5 MCG/ACT IN AERO
2.0000 | INHALATION_SPRAY | Freq: Two times a day (BID) | RESPIRATORY_TRACT | Status: DC
  Administered 2012-08-25 – 2012-08-26 (×2): 2 via RESPIRATORY_TRACT
  Filled 2012-08-25 (×2): qty 6

## 2012-08-25 MED ORDER — SODIUM CHLORIDE 0.9 % IV SOLN
INTRAVENOUS | Status: AC
  Administered 2012-08-25: 17:00:00 via INTRAVENOUS

## 2012-08-25 MED ORDER — LORAZEPAM 2 MG/ML IJ SOLN
2.0000 mg | Freq: Once | INTRAMUSCULAR | Status: AC
  Administered 2012-08-25: 2 mg via INTRAVENOUS
  Filled 2012-08-25: qty 1

## 2012-08-25 MED ORDER — SODIUM CHLORIDE 0.9 % IV SOLN: INTRAVENOUS | Status: DC

## 2012-08-25 MED ORDER — OMEGA-3-ACID ETHYL ESTERS 1 G PO CAPS
1.0000 g | ORAL_CAPSULE | Freq: Two times a day (BID) | ORAL | Status: DC
  Administered 2012-08-25 – 2012-08-26 (×2): 1 g via ORAL
  Filled 2012-08-25 (×5): qty 1

## 2012-08-25 MED ORDER — ONDANSETRON HCL 4 MG/2ML IJ SOLN: 4.0000 mg | Freq: Four times a day (QID) | INTRAMUSCULAR | Status: DC | PRN

## 2012-08-25 MED ORDER — ACETAMINOPHEN 325 MG PO TABS
650.0000 mg | ORAL_TABLET | ORAL | Status: DC | PRN
  Administered 2012-08-26: 12:00:00 650 mg via ORAL
  Filled 2012-08-25: qty 2

## 2012-08-25 MED ORDER — SODIUM CHLORIDE 0.9 % IV SOLN: 250.0000 mL | INTRAVENOUS | Status: DC | PRN

## 2012-08-25 MED ORDER — HEPARIN (PORCINE) IN NACL 2-0.9 UNIT/ML-% IJ SOLN
INTRAMUSCULAR | Status: AC
  Filled 2012-08-25: qty 1500

## 2012-08-25 MED ORDER — LIDOCAINE HCL (PF) 1 % IJ SOLN
INTRAMUSCULAR | Status: AC
  Filled 2012-08-25: qty 30

## 2012-08-25 MED ORDER — HEPARIN SODIUM (PORCINE) 1000 UNIT/ML IJ SOLN
INTRAMUSCULAR | Status: AC
  Filled 2012-08-25: qty 1

## 2012-08-25 MED ORDER — ASPIRIN EC 81 MG PO TBEC: 81.0000 mg | DELAYED_RELEASE_TABLET | Freq: Every day | ORAL | Status: DC

## 2012-08-25 NOTE — ED Provider Notes (Signed)
History     CSN: [id number]  Arrival date & time 08/25/12  0820   First MD Initiated Contact with Patient 08/25/12 [phone]      Chief Complaint  Patient presents with  . Chest Pain  . Shortness of Breath    (Consider location/radiation/quality/duration/timing/severity/associated sxs/prior treatment) The history is provided by the patient.   51 year old, male, with known coronary artery disease, and diabetes, and hypertension, presents to emergency department complaining of chest pressure, that feels like something is sitting on his chest along with shortness of breath, and diaphoresis.  His symptoms started when he was at work, exerting himself and 8R relief.  When he rests.  He denies nausea, or vomiting.  He has not had fevers, chills, cough.  He took his Plavix, and one baby aspirin.  This morning.  He does not have chest pain.  At this time.  Past Medical History  Diagnosis Date  . Diabetes mellitus   . Hypertension   . Asthma   . Pancreatitis   . Coronary artery disease   . Depressed     Past Surgical History  Procedure Date  . Cardiac catheterization   . Coronary stent placement     No family history on file.  History  Substance Use Topics  . Smoking status: Never Smoker   . Smokeless tobacco: Not on file  . Alcohol Use: No      Review of Systems  Constitutional: Positive for diaphoresis. Negative for fever and chills.  HENT: Negative for neck pain.   Respiratory: Positive for shortness of breath.   Cardiovascular: Positive for chest pain. Negative for palpitations and leg swelling.  Gastrointestinal: Negative for nausea and vomiting.  All other systems reviewed and are negative.    Allergies  Review of patient's allergies indicates no known allergies.  Home Medications   Current Outpatient Rx  Name Route Sig Dispense Refill  . ALBUTEROL SULFATE HFA 108 (90 BASE) MCG/ACT IN AERS Inhalation Inhale 2 puffs into the lungs every 6 (six) hours as needed.  For wheezing and shortness of breath.    . ASPIRIN 81 MG PO TABS Oral Take 81 mg by mouth daily.    Marland Kitchen BENAZEPRIL-HYDROCHLOROTHIAZIDE 10-12.5 MG PO TABS Oral Take 1 tablet by mouth daily.    . BUDESONIDE-FORMOTEROL FUMARATE 160-4.5 MCG/ACT IN AERO Inhalation Inhale 2 puffs into the lungs 2 (two) times daily.    Marland Kitchen CITALOPRAM HYDROBROMIDE 40 MG PO TABS Oral Take 40 mg by mouth daily.    Marland Kitchen CLOPIDOGREL BISULFATE 75 MG PO TABS Oral Take 75 mg by mouth daily.    Marland Kitchen FERROUS GLUCONATE 325 MG PO TABS Oral Take 325 mg by mouth daily with breakfast.    . IBUPROFEN 200 MG PO TABS Oral Take 400 mg by mouth every 6 (six) hours as needed. For headache.    . IBUPROFEN 800 MG PO TABS Oral Take 800 mg by mouth every 8 (eight) hours as needed.    . INSULIN GLARGINE 100 UNIT/ML [place] SOLN Subcutaneous Inject 33 Units into the skin at bedtime.    Marland Kitchen LATANOPROST 0.005 % OP SOLN Both Eyes Place 1 drop into both eyes at bedtime.    . METHYLCELLULOSE 1 % OP SOLN Both Eyes Place 1 drop into both eyes as needed.    Marland Kitchen METOPROLOL TARTRATE 50 MG PO TABS Oral Take 50 mg by mouth 2 (two) times daily.    Marland Kitchen ONE-DAILY MULTI VITAMINS PO TABS Oral Take 1 tablet by mouth daily.    Marland Kitchen  NAPROXEN SODIUM 220 MG PO TABS Oral Take 440 mg by mouth 2 (two) times daily with a meal. For pain.    Marland Kitchen NITROGLYCERIN 0.4 MG SL SUBL Sublingual Place 0.4 mg under the tongue every 5 (five) minutes as needed.    Marland Kitchen QUETIAPINE FUMARATE 100 MG PO TABS Oral Take 100 mg by mouth at bedtime.    Marland Kitchen ROSUVASTATIN CALCIUM 40 MG PO TABS Oral Take 40 mg by mouth daily.    . TRAZODONE HCL 100 MG PO TABS Oral Take 100 mg by mouth at bedtime.      BP 141/93  Pulse 61  Temp 97.7 F (36.5 C) (Oral)  Resp 18  SpO2 98%  Physical Exam  Nursing note and vitals reviewed. Constitutional: He is oriented to person, place, and time. He appears well-developed and well-nourished. No distress.  HENT:  Head: Normocephalic and atraumatic.  Eyes: Conjunctivae normal are normal.    Neck: Normal range of motion.  Cardiovascular: Normal rate, regular rhythm and intact distal pulses.   No murmur heard. Pulmonary/Chest: Effort normal and breath sounds normal. No respiratory distress. He has no rales.  Abdominal: Soft. Bowel sounds are normal. There is no tenderness.  Musculoskeletal: Normal range of motion. He exhibits no edema.  Neurological: He is alert and oriented to person, place, and time. No cranial nerve deficit.  Skin: Skin is warm and dry. He is not diaphoretic.  Psychiatric: He has a normal mood and affect. Thought content normal.    ED Course  Procedures (including critical care time) 51 year old, male, with known coronary artery disease, presents with symptoms consistent with unstable angina.  Presently, pain free.  We'll do an EKG, chest x-ray, and laboratory testing, for evaluation and then consult cardiology.   Labs Reviewed  CBC  BASIC METABOLIC PANEL  TROPONIN I   No results found.   No diagnosis found.  ECG. Sinus bradycardia at 58 beats per minute. Normal axis. Poor R wave progression. Normal intervals.  normal ST and T waves  10:10 AM Spoke with [employer] cards. They will come eval for unstable angina  MDM  Angina No stemi and trop neg.   asx now.        Cheri Guppy, MD 08/25/12 1011

## 2012-08-25 NOTE — ED Notes (Signed)
Pt is here with chest pain and shortness of breath for the last 2 days.  Pt reports stent placement a couple of years ago with same symptoms.  Pt reports cramping all over

## 2012-08-25 NOTE — ED Notes (Signed)
CALLED CKMB 8.1 AND INDEX OF 1.5 TO KIM CHARGE NURSE

## 2012-08-25 NOTE — CV Procedure (Signed)
    Cardiac Catheterization Operative Report  Jose [name] [id number] 10/2/20134:01 PM No primary provider on file.  Procedure Performed:  1. Left Heart Catheterization 2. Selective Coronary Angiography  Operator: Verne Carrow, MD  Arterial access site:  Right radial artery.   Indication:  Chest pain, known CAD with multiple previous coronary interventions with one stent in proximal to mid LAD in 2011 and two overlapping stents (?type) in the distal RCA.                              Procedure Details: The risks, benefits, complications, treatment options, and expected outcomes were discussed with the patient. The patient and/or family concurred with the proposed plan, giving informed consent. The patient was brought to the cath lab after IV hydration was begun and oral premedication was given. The patient was further sedated with Versed and Fentanyl. The right wrist was assessed with an Allens test which was positive. The right wrist was prepped and draped in a sterile fashion. 1% lidocaine was used for local anesthesia. Using the modified Seldinger access technique, a 5 French sheath was placed in the right radial artery. 3 mg Verapamil was given through the sheath. 4000 units IV heparin was given. Standard diagnostic catheters were used to perform selective coronary angiography. A pigtail catheter was used to measure LV pressures. The sheath was removed from the right radial artery and a Terumo hemostasis band was applied at the arteriotomy site on the right wrist.    There were no immediate complications. The patient was taken to the recovery area in stable condition.   Hemodynamic Findings: Central aortic pressure: 166/90 Left ventricular pressure: 163/9/12  Angiographic Findings:  Left main: The left main has calcification and tapers distally with a 20% stenosis.   Left Anterior Descending Artery: Large caliber vessel that courses to the apex. The proximal vessel has a  patent stent with minimal restenosis, 20% diffuse restenosis throughout the stent. There are two very small caliber diagonal branches with no disease. The small caliber septal perforating branch is jailed by the stent and has 99% ostial stenosis.   Circumflex Artery: Moderate sized vessel with 30% ostial stenosis.   [place] Intermediate: Large caliber vessel with several distal branches. Mild plaque in the proximal vessel.   Right Coronary Artery: Large, dominant vessel with 30% proximal stenosis. There is a patent stent in the distal vessel with diffuse 20-30% in-stent restenosis. The PDA is patent with mild plaque.   Left Ventricular Angiogram: Deferred.   Impression: 1. Double vessel CAD with patent stents distal RCA and mid LAD  Recommendations: He will need to remain on cardiac meds. He has been off of all medications. Would recommend a beta blocker, statin, ASA and possibly long acting nitrates if he has recurrence of chest discomfort.        Complications:  None. The patient tolerated the procedure well.

## 2012-08-25 NOTE — Interval H&P Note (Signed)
History and Physical Interval Note:  08/25/2012 3:25 PM  Jose [name]  has presented today for cardiac cath with the diagnosis of cp  The various methods of treatment have been discussed with the patient and family. After consideration of risks, benefits and other options for treatment, the patient has consented to  Procedure(s) (LRB) with comments: LEFT HEART CATHETERIZATION WITH CORONARY ANGIOGRAM (Bilateral) as a surgical intervention .  The patient's history has been reviewed, patient examined, no change in status, stable for surgery.  I have reviewed the patient's chart and labs.  Questions were answered to the patient's satisfaction.     Rolfe Hartsell

## 2012-08-25 NOTE — Progress Notes (Signed)
TR BAND REMOVAL  LOCATION:    right radial  DEFLATED PER PROTOCOL:    yes  TIME BAND OFF / DRESSING APPLIED:    1815   SITE UPON ARRIVAL:    Level 0  SITE AFTER BAND REMOVAL:    Level 0  REVERSE ALLEN'S TEST:     positive  CIRCULATION SENSATION AND MOVEMENT:    Within Normal Limits   yes  COMMENTS:   Tolerated procedure well 

## 2012-08-25 NOTE — Progress Notes (Signed)
CE results called to [facility] with no new orders. Patient states I feel shaky. CBG 56. Hypoglycemic protocol initiated. D50 25 grams given Iv. Will reevaluate. IVT to floor to attempt 2nd IV access w/out success. Mamie Levers

## 2012-08-25 NOTE — ED Notes (Signed)
CARDIOLOGY PA AT BEDSIDE 

## 2012-08-25 NOTE — ED Notes (Signed)
Reports return of right sided "chest cramping"

## 2012-08-25 NOTE — H&P (Signed)
History and Physical  Patient ID: Jose [name] MRN: [id number], SOB: 21-Apr-1961 51 y.o. Date of Encounter: 08/25/2012, 11:25 AM  [facility] [place] Primary Cardiologist: Dr. Elease Hashimoto  Chief Complaint: chest pain  HPI: 51 y.o. male w/ PMHx significant for CAD (MI '10 s/p PCIs to RCA, s/p PCI to LAD '11), DMII, HTN, HLD, and CKD (stage III) who presented to [facility] on 08/25/2012 with complaints of chest pain.  Patient is a poor historian. Information obtained from medical records. He had an inferior MI with stents placed in his RCA in 2010.  He had placement of an LAD stent on March 20, 2010. Last cath 04/16/10 showed patent stents, otherwise unchanged nonobstructive disease. Echo 2011 showed nl LV systolic fxn, EF 69-62%, no RWMAs, grade 2 diastolic dysfunction. Patient reports having a stress test last year with his cardiologist at the [place] that he thinks was normal.  Chest pain started two days ago and has been intermittent since that time. 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 Chest pain worse when climbing stairs. (+) sob, diaphoresis (-) nausea. CP lasts ~ 30secs and improves with rest. Did not take NTG. Woke up with chest pain at 4am this morning prompting him to seek medical evaluation. (+) orthopnea, PND for the last two days/nights. Reports compliance with all medications including Plavix. Has not taken statin for ~37mo due to muscle cramps. Denies recent illness, fever, chills, cough, edema, syncope, back/abd/calf pain, change in bladder/bowel habits, melena/hematochezia, prolonged immobilization/extended travel, or surgery.  In the ED, EKG shows sinus rhythm 58bpm with no acute ST/T changes. CXR is without acute cardiopulmonary abnormalities. Labs are significant for normal troponin, Crt 1.37, glucose 314, unremarkable CBC. Received 81mg  ASA and Ativan.  Chest pain free now. Patient is lethargic and falls  asleep during examination.   Past Medical History  Diagnosis Date  . Diabetes mellitus, type 2   . Hypertension   . Asthma   . Pancreatitis   . Coronary artery disease     s/p PCI RCA '10, s/p PCI LAD '11  . Depressed   . HLD (hyperlipidemia)   . CKD (chronic kidney disease)     04/16/10 - Cardiac Cath HEMODYNAMICS:  Aortic pressure is 123/75.  No left ventriculogram was performed secondary to the fact that we just performed one a month ago. ANGIOGRAPHY:   Left main.  The left main is smooth and normal. The left anterior descending artery has a stent in the proximal LAD. The stent is widely patent and there is no evidence of restenosis.  The mid left anterior descending artery is quite tortuous and has a 50% taper to it.  This is basically unchanged from his previous catheterization.  The diagonal arteries are small, but normal. The left circumflex artery has minor luminal irregularities. The ramus intermediate artery is a very large and branching vessel. There are minor luminal irregularities in the ramus intermediate vessel. Right coronary artery has minor luminal irregularities.  The distal RCA has 2 stents.  The stents have minor luminal irregularities with a stenosis of around 10% to 20%. CONCLUSIONS: 1. Patent stents in the LAD and the distal right coronary artery. 2. Noncardiac chest pain.  We will discharge the patient to home.  He will follow up with Dr. Elease Hashimoto in several weeks.  03/16/10 - Echo Study Conclusions Left ventricle: There was mild concentric hypertrophy. Systolic function was normal. The estimated ejection fraction was  in the range of 60% to 65%. Wall motion was normal; there were no regional wall motion abnormalities. Features are consistent with a pseudonormal left ventricular filling pattern, with concomitant abnormal relaxation and increased filling pressure (grade 2 diastolic dysfunction). Doppler parameters are consistent with high ventricular filling  pressure  Surgical History:  Past Surgical History  Procedure Date  . Cardiac catheterization   . Coronary stent placement      Home Meds: Medication Sig  albuterol (PROVENTIL HFA;VENTOLIN HFA) 108 (90 BASE) MCG/ACT inhaler Inhale 2 puffs into the lungs every 6 (six) hours as needed. For wheezing and shortness of breath.  aspirin 81 MG tablet Take 81 mg by mouth daily.  budesonide-formoterol (SYMBICORT) 160-4.5 MCG/ACT inhaler Inhale 2 puffs into the lungs 2 (two) times daily.  clopidogrel (PLAVIX) 75 MG tablet Take 75 mg by mouth daily.  ibuprofen (ADVIL,MOTRIN) 200 MG tablet Take 400 mg by mouth every 6 (six) hours as needed. For headache.  insulin glargine (LANTUS) 100 UNIT/ML injection Inject 30 Units into the skin daily.   Multiple Vitamin (MULTIVITAMIN) tablet Take 1 tablet by mouth daily.  Omega-3 Fatty Acids (FISH OIL) 1000 MG CAPS Take 1 capsule by mouth 2 (two) times daily.  QUEtiapine (SEROQUEL) 100 MG tablet Take 100 mg by mouth at bedtime.  Travoprost, BAK Free, (TRAVATAN) 0.004 % SOLN ophthalmic solution Place 1 drop into both eyes at bedtime.  traZODone (DESYREL) 100 MG tablet Take 100 mg by mouth at bedtime.  benazepril-hydrochlorthiazide (LOTENSIN HCT) 10-12.5 MG per tablet Take 1 tablet by mouth daily.  methylcellulose (ARTIFICIAL TEARS) 1 % ophthalmic solution Place 1 drop into both eyes as needed. For dry eyes  nitroGLYCERIN (NITROSTAT) 0.4 MG SL tablet Place 0.4 mg under the tongue every 5 (five) minutes as needed.    Allergies: No Known Allergies  History   Social History  . Marital Status: Married    Spouse Name: N/A    Number of Children: N/A  . Years of Education: N/A   Occupational History  . Not on file.   Social History Main Topics  . Smoking status: Former Smoker    Types: Cigarettes    Quit date: 11/25/2007  . Smokeless tobacco: Never Used  . 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 Drug Use: No  . Sexually Active: No    Other Topics Concern  . Not on file   Social History Narrative  . No narrative on file     Family History  Problem Relation Age of Onset  . Heart disease Mother     MI 19s     Review of Systems: General: negative for chills, fever, night sweats or weight changes.  Cardiovascular: As per HPI Dermatological: negative for rash Respiratory: negative for cough or wheezing Urologic: negative for hematuria Abdominal: negative for nausea, vomiting, diarrhea, bright red blood per rectum, melena, or hematemesis Neurologic: negative for visual changes, syncope, or dizziness All other systems reviewed and are otherwise negative except as noted above.  Labs:   Component Value Date   WBC 5.5 08/25/2012   HGB 15.4 08/25/2012   HCT 42.0 08/25/2012   MCV 77.5* 08/25/2012   PLT 165 08/25/2012    Lab 08/25/12 0850  NA 134*  K 4.8  CL 97  CO2 25  BUN 23  CREATININE 1.37*  CALCIUM 10.0  GLUCOSE 314*    Basename 08/25/12 0850  TROPONINI <0.30    Radiology/Studies:   08/25/2012 - CHEST -  1 VIEW   Findings: The heart size and mediastinal contours are within normal limits.  Both lungs are clear.  IMPRESSION: No active disease.      EKG: 08/25/12 @ 0827 - sinus rhythm 58bpm with no acute ST/T changes  Physical Exam: Blood pressure 139/94, pulse 61, temperature 97.8 F (36.6 C), temperature source Oral, resp. rate 12, SpO2 99.00%. General: Well developed, well nourished, middle-aged black male, in no acute distress. Head: Normocephalic, atraumatic, sclera non-icteric, nares are without discharge Neck: Supple. Negative for carotid bruits. No JVD Lungs: Diminished throughout Breathing is unlabored. Heart: RRR with S1 S2. No murmurs, rubs, or gallops appreciated. Abdomen: Soft, non-tender, non-distended with normoactive bowel sounds. No rebound/guarding. No obvious abdominal masses. Msk: (+) muscle cramps in hands/feet. Strength and tone appear normal for age. Extremities: No edema.  No clubbing or cyanosis. Distal pedal pulses are intact and equal bilaterally. Neuro: Lethargic, oriented X 3. Moves all extremities spontaneously. Psych:  Responds to questions slowly with a flat affect.    ASSESSMENT AND PLAN:  51 y.o. male w/ PMHx significant for CAD (MI '10 s/p PCIs to RCA, s/p PCI to LAD '11), DMII, HTN, HLD, and CKD (stage III) who presented to [facility] on 08/25/2012 with complaints of chest pain.  1. Unstable angina 2. Coronary Artery Disease 3. Diabetes Mellitus, Type 2, uncontrolled 4. Hypertension 5. Hyperlipidemia 6. CKD Stage III 7. Muscle cramps  Patient presents with chest pain with typical features concerning for unstable angina. H/o CAD w/ PCIs 2010 and 2011. Last cardiac cath was > 11yrs ago. Reports compliance with meds, except statin as he has had elevated CKs and muscle cramps. EKG nonischemic and troponin normal. Will place in observation, cycle cardiac enzymes, check ESR & P2Y12, and plan for cardiac cath this afternoon. Crt 1.37. No heparin with normal enzymes.  Assess resumption of statin pending CKs and lipid panel. BPs mildly elevated. Cont antihypertensives. Glucose elevated. Place on SSI and cont Lantus. Check A1c, lipid panel, TSH. Give robaxin for muscle cramps.    Signed, HOPE, JESSICA PA-C 08/25/2012, 11:25 AM  Patient examined chart reviewed.  Recurrent SSCP in poorly controlled diabetic with previous stents.  Cath today.  Given cramping in hands and feet with elevated CPK may have a component of rhabdo.  Hydrate.  SS insulin.  Risk of cath discussed and willing to proceed  Charlton Haws

## 2012-08-26 DIAGNOSIS — I10 Essential (primary) hypertension: Secondary | ICD-10-CM | POA: Insufficient documentation

## 2012-08-26 DIAGNOSIS — E1159 Type 2 diabetes mellitus with other circulatory complications: Secondary | ICD-10-CM | POA: Insufficient documentation

## 2012-08-26 LAB — LIPID PANEL
Cholesterol: 129 mg/dL (ref 0–200)
LDL Cholesterol: 72 mg/dL (ref 0–99)
Total CHOL/HDL Ratio: 3.5 RATIO
VLDL: 20 mg/dL (ref 0–40)

## 2012-08-26 LAB — BASIC METABOLIC PANEL
CO2: 26 mEq/L (ref 19–32)
Calcium: 9.3 mg/dL (ref 8.4–10.5)
Creatinine, Ser: 1.44 mg/dL — ABNORMAL HIGH (ref 0.50–1.35)
GFR calc Af Amer: 63 mL/min — ABNORMAL LOW (ref >90)
Sodium: 137 mEq/L (ref 135–145)

## 2012-08-26 LAB — CBC
MCH: 28.2 pg (ref 26.0–34.0)
Platelets: 149 10*3/uL — ABNORMAL LOW (ref 150–400)
RBC: 5.25 MIL/uL (ref 4.22–5.81)

## 2012-08-26 LAB — GLUCOSE, CAPILLARY: Glucose-Capillary: 154 mg/dL — ABNORMAL HIGH (ref 70–99)

## 2012-08-26 MED ORDER — ASPIRIN EC 81 MG PO TBEC
81.0000 mg | DELAYED_RELEASE_TABLET | Freq: Every day | ORAL | Status: DC
  Administered 2012-08-26: 81 mg via ORAL
  Filled 2012-08-26: qty 1

## 2012-08-26 MED ORDER — INFLUENZA VIRUS VACC SPLIT PF IM SUSP
0.5000 mL | INTRAMUSCULAR | Status: AC
  Administered 2012-08-26: 10:00:00 0.5 mL via INTRAMUSCULAR
  Filled 2012-08-26: qty 0.5

## 2012-08-26 MED ORDER — PNEUMOCOCCAL VAC POLYVALENT 25 MCG/0.5ML IJ INJ
0.5000 mL | INJECTION | INTRAMUSCULAR | Status: AC
  Administered 2012-08-26: 10:00:00 0.5 mL via INTRAMUSCULAR
  Filled 2012-08-26: qty 0.5

## 2012-08-26 NOTE — Progress Notes (Signed)
PROGRESS NOTE  Subjective:   Jose [name] is a 51 yo - assigned to see me.  I have seen him in the hospital - never in the office.  He goes to the [place] in [place].  He presented to the ER with CP yesterday - he had run out of his medications.  Cath yesterday revealed minor luminal irreg. Patent stents.   Objective:    Vital Signs:   Temp:  [97.7 F (36.5 C)-98.5 F (36.9 C)] 97.9 F (36.6 C) (10/03 0615) Pulse Rate:  [46-61] 54  (10/03 0615) Resp:  [12-18] 14  (10/03 0615) BP: (111-149)/(59-94) 115/68 mmHg (10/03 0615) SpO2:  [98 %-100 %] 100 % (10/03 0615) Weight:  [181 lb 8 oz (82.328 kg)-185 lb 6.5 oz (84.1 kg)] 185 lb 6.5 oz (84.1 kg) (10/03 0016)  Last BM Date: 08/25/12   24-hour weight change: Weight change:   Weight trends: Filed Weights   08/25/12 1242 08/26/12 0016  Weight: 181 lb 8 oz (82.328 kg) 185 lb 6.5 oz (84.1 kg)    Intake/Output:  10/02 0701 - 10/03 0700 In: 988.3 [P.O.:600; I.V.:388.3] Out: -      Physical Exam: BP 115/68  Pulse 54  Temp 97.9 F (36.6 C) (Oral)  Resp 14  Ht 5\' 11"  (1.803 m)  Wt 185 lb 6.5 oz (84.1 kg)  BMI 25.86 kg/m2  SpO2 100%  General: Vital signs reviewed and noted.   Head: Normocephalic, atraumatic.  Eyes: conjunctivae/corneas clear.  EOM's intact.   Throat: normal  Neck: Supple. Normal carotids. No JVD  Lungs:  Clear to auscultation  Heart: Regular rate,  With normal  S1 S2. No murmurs, gallops or rubs  Abdomen:  Soft, non-tender, non-distended with normoactive bowel sounds. No hepatomegaly. No rebound/guarding. No abdominal masses.  Extremities: Distal pedal pulses are 2+ .  No edema.    Neurologic: A&O X3, CN II - XII are grossly intact. Motor strength is 5/5 in the all 4 extremities.  Psych: Responds to questions appropriately with normal affect.    Labs: BMET:  Basename 08/25/12 0850  NA 134*  K 4.8  CL 97  CO2 25  GLUCOSE 314*  BUN 23  CREATININE 1.37*  CALCIUM 10.0  MG --  PHOS --    Liver  function tests: No results found for this basename: AST:2,ALT:2,ALKPHOS:2,BILITOT:2,PROT:2,ALBUMIN:2 in the last 72 hours No results found for this basename: LIPASE:2,AMYLASE:2 in the last 72 hours  CBC:  Basename 08/25/12 0850  WBC 5.5  NEUTROABS --  HGB 15.4  HCT 42.0  MCV 77.5*  PLT 165    Cardiac Enzymes:  Basename 08/25/12 1154 08/25/12 0850  CKTOTAL 541* --  CKMB 8.1* --  TROPONINI <0.30 <0.30    Coagulation Studies:  Basename 08/25/12 1155  LABPROT 12.3  INR 0.92    Other: No components found with this basename: POCBNP:3 No results found for this basename: DDIMER in the last 72 hours  Basename 08/25/12 1155  HGBA1C 8.2*   No results found for this basename: CHOL,HDL,LDLCALC,TRIG,CHOLHDL in the last 72 hours  Basename 08/25/12 1155  TSH 1.997  T4TOTAL --  T3FREE --  THYROIDAB --   No results found for this basename: VITAMINB12,FOLATE,FERRITIN,TIBC,IRON,RETICCTPCT in the last 72 hours   Tele:  NSR  Medications:    Infusions:    . sodium chloride 50 mL/hr at 08/25/12 1645  . DISCONTD: sodium chloride    . DISCONTD: dextrose      Scheduled Medications:    . aspirin  324 mg Oral Pre-Cath  . aspirin  81 mg Oral Once  . aspirin EC  81 mg Oral Daily  . atorvastatin  40 mg Oral q1800  . benazepril  10 mg Oral Daily   And  . hydrochlorothiazide  12.5 mg Oral Daily  . budesonide-formoterol  2 puff Inhalation BID  . clopidogrel  75 mg Oral Daily  . dextrose      . diazepam  5 mg Oral On Call  . fentaNYL      . heparin      . heparin      . influenza  inactive virus vaccine  0.5 mL Intramuscular Tomorrow-1000  . insulin aspart  0-15 Units Subcutaneous TID WC  . insulin glargine  30 Units Subcutaneous Daily  . lidocaine      . LORazepam  2 mg Intravenous Once  . methocarbamol  500 mg Oral Once  . midazolam      . multivitamin with minerals  1 tablet Oral Daily  . nitroGLYCERIN      . omega-3 acid ethyl esters  1 g Oral BID  .  pneumococcal 23 valent vaccine  0.5 mL Intramuscular Tomorrow-1000  . QUEtiapine  100 mg Oral QHS  . sodium chloride  3 mL Intravenous Q12H  . Travoprost (BAK Free)  1 drop Both Eyes QHS  . traZODone  100 mg Oral QHS  . verapamil      . DISCONTD: aspirin  81 mg Oral Daily  . DISCONTD: benazepril-hydrochlorthiazide  1 tablet Oral Daily  . DISCONTD: multivitamin  1 tablet Oral Daily    Assessment/ Plan:    1. CAD:  Cath yesterday revealed minor irreg.  No culprit lesions. He will need to take his medications.  I have offered to see him but he will not come to the office.  I suggested that he go to the [facility] cardiologist.   Home today on same meds.  Albuterol HFA ASA 81 symbicort 160-4.5 2 puffs twice a day Plavix Lantus insulin per home dose seroquel 100 Q HS travoprost desyrel per home dose lotensin - HCT NTG PRN    Return to see [place] PRN  Disposition: DC to home today. Length of Stay: 1  Vesta Mixer, Montez Hageman., MD, [facility] 08/26/2012, 8:07 AM Office [phone] Pager [phone]

## 2012-08-26 NOTE — Progress Notes (Signed)
Pt with c/o sob and adamantly requesting steroid/prednisone, stating last time he was admitted with sob that he was given steroids then.  Pts lungs sound clear, no wheezes.  Sats 100% on RA.  CXR negative. Pt was cath'd today-see Dr. Weldon Picking note.  Pt does have symbicort inhaler for tonight but pt stated that would not be enough. [name] PA notified of above and stated that pt can use O2 overnight but no steroids.  Pt declined the need for oxygen and stated he would discuss with doctor in the am and then preceded to eat a sandwich with no resp difficulty/visible sob. Will continue to monitor during shift.

## 2012-08-26 NOTE — Progress Notes (Signed)
Utilization Review Completed.  

## 2012-08-26 NOTE — Discharge Summary (Signed)
CARDIOLOGY DISCHARGE SUMMARY   Patient ID: Jose [name] MRN: [id number] DOB/AGE: [birth year] 50 y.o.  Admit date: 08/25/2012 Discharge date: 08/26/2012  Primary Discharge Diagnosis:  Unstable anginal pain felt secondary to poorly controlled diabetes and possible noncompliance with meds  Secondary Discharge Diagnosis:  Past Medical History  Diagnosis Date  . Diabetes mellitus, type 2   . Hypertension   . Asthma   . Pancreatitis   . Coronary artery disease     s/p PCI RCA '10, s/p PCI LAD '11  . Depressed   . HLD (hyperlipidemia)   . CKD (chronic kidney disease)     Procedures:  1. Left Heart Catheterization 2. Selective Coronary Angiography  Hospital Course: Jose [name] is a 51 year old male with a history of coronary artery disease and MI. He had intermittent chest pain for 2 days and when it became more severe, and woke him, he came to the hospital where he was admitted for further evaluation and treatment.  His CK-MB was elevated but the index was within normal limits. All of his troponins were negative. His blood pressure was initially elevated but it improved. There was concern about compliance with his medications and [facility] prescribed. A hemoglobin A1c was elevated and a lipid profile showed a low HDL but his LDL is acceptable at 72. His chronic kidney disease was followed closely during his hospital stay and he had no significant change, post-cath. He was continued on his home insulin with sliding scale for tight glucose control. His hemoglobin A1c is elevated and he is encouraged to stick to a diabetic diet and follow up with his primary care physician. A P2Y12 test was performed and showed good platelet inhibition. He was mildly hyponatremic on admission but this improved.  His symptoms were concerning for progressive anginal pain. Cardiac catheterization was indicated and this was performed on 08/25/2012. The full  results are listed below. He had nonobstructive disease and medical therapy was recommended.  On 08/26/2012, he was seen by Dr. Elease [name]. Compliance with medications and followup at the [facility] was reinforced. He was ambulating without chest pain or shortness of breath and considered stable for discharge, to [facility], and with [facility] cardiology as needed.   Labs:   Lab Results  Component Value Date   WBC 5.8 08/26/2012   HGB 14.8 08/26/2012   HCT 40.7 08/26/2012   MCV 77.5* 08/26/2012   PLT 149* 08/26/2012     Lab 08/26/12 0550  NA 137  K 4.2  CL 104  CO2 26  BUN 23  CREATININE 1.44*  CALCIUM 9.3  PROT --  BILITOT --  ALKPHOS --  ALT --  AST --  GLUCOSE 177*    Basename 08/25/12 1154 08/25/12 0850  CKTOTAL 541* --  CKMB 8.1* --  CKMBINDEX 1.5 --  TROPONINI <0.30 <0.30   Lipid Panel     Component Value Date/Time   CHOL 129 08/26/2012 0550   TRIG 98 08/26/2012 0550   HDL 37* 08/26/2012 0550   CHOLHDL 3.5 08/26/2012 0550   VLDL 20 08/26/2012 0550   LDLCALC 72 08/26/2012 0550    Basename 08/25/12 1155  INR 0.92      Radiology: Dg Chest Port 1 View  08/25/2012  *RADIOLOGY REPORT*  Clinical Data: Chest pain, shortness of breath  CHEST - 1 VIEW  Comparison:  03/09/2011  Findings: The heart size and mediastinal contours are within normal  limits.  Both lungs are clear.  IMPRESSION: No active disease.   Original Report Authenticated By: Judie Petit. Ruel Favors, M.D.     Cardiac Cath: 08/25/2012 Left main: The left main has calcification and tapers distally with a 20% stenosis.  Left Anterior Descending Artery: Large caliber vessel that courses to the apex. The proximal vessel has a patent stent with minimal restenosis, 20% diffuse restenosis throughout the stent. There are two very small caliber diagonal branches with no disease. The small caliber septal perforating branch is jailed by the stent and has 99% ostial stenosis.  Circumflex Artery: Moderate sized  vessel with 30% ostial stenosis.  Ramus Intermediate: Large caliber vessel with several distal branches. Mild plaque in the proximal vessel.  Right Coronary Artery: Large, dominant vessel with 30% proximal stenosis. There is a patent stent in the distal vessel with diffuse 20-30% in-stent restenosis. The PDA is patent with mild plaque.  Left Ventricular Angiogram: Deferred.  Impression:  1. Double vessel CAD with patent stents distal RCA and mid LAD  EKG: 25-Aug-2012 14:02:53  Sinus bradycardia No significant change since last tracing 52mm/s 51mm/mV 100Hz  8.0.1 12SL 241 HD CID: 1 Referred by: Confirmed By: Rollene Rotunda MD Vent. rate 50 BPM PR interval 122 ms QRS duration 96 ms QT/QTc 430/392 ms P-R-T axes 30 55 30  FOLLOW UP PLANS AND APPOINTMENTS No Known Allergies   Medication List     As of 08/26/2012  9:40 AM    TAKE these medications         albuterol 108 (90 BASE) MCG/ACT inhaler   Commonly known as: PROVENTIL HFA;VENTOLIN HFA   Inhale 2 puffs into the lungs every 6 (six) hours as needed. For wheezing and shortness of breath.      aspirin 81 MG tablet   Take 81 mg by mouth daily.      benazepril-hydrochlorthiazide 10-12.5 MG per tablet   Commonly known as: LOTENSIN HCT   Take 1 tablet by mouth daily.      budesonide-formoterol 160-4.5 MCG/ACT inhaler   Commonly known as: SYMBICORT   Inhale 2 puffs into the lungs 2 (two) times daily.      clopidogrel 75 MG tablet   Commonly known as: PLAVIX   Take 75 mg by mouth daily.      Fish Oil 1000 MG Caps   Take 1 capsule by mouth 2 (two) times daily.      ibuprofen 200 MG tablet   Commonly known as: ADVIL,MOTRIN   Take 400 mg by mouth every 6 (six) hours as needed. For headache.      insulin glargine 100 UNIT/ML injection   Commonly known as: LANTUS   Inject 30 Units into the skin daily.      methylcellulose 1 % ophthalmic solution   Commonly known as: ARTIFICIAL TEARS   Place 1 drop into both eyes as needed.  For dry eyes      multivitamin tablet   Take 1 tablet by mouth daily.      nitroGLYCERIN 0.4 MG SL tablet   Commonly known as: NITROSTAT   Place 0.4 mg under the tongue every 5 (five) minutes as needed.      QUEtiapine 100 MG tablet   Commonly known as: SEROQUEL   Take 100 mg by mouth at bedtime.      Travoprost (BAK Free) 0.004 % Soln ophthalmic solution   Commonly known as: TRAVATAN   Place 1 drop into both eyes at bedtime.      traZODone  100 MG tablet   Commonly known as: DESYREL   Take 100 mg by mouth at bedtime.        Follow-up Information    Follow up with [facility] (As needed)    Contact information:   [address] [place] [address]          BRING ALL MEDICATIONS WITH YOU TO FOLLOW UP APPOINTMENTS  Time spent with patient to include physician time: 35 min Signed: Theodore Demark 08/26/2012, 9:40 AM Co-Sign MD  Attending note: See my note from earlier today.  He needs to follow up with cardiology - either here or at the [place].  He needs to return to see his medical doctor for his COPD meds.  Alvia Grove MD, [facility]

## 2012-09-03 IMAGING — CR DG CHEST 1V PORT
1 series · 1 of 1 positions shown · non-contrast
Comparison: 05/19/2010

CLINICAL DATA: Chest pain

PORTABLE CHEST - 1 VIEW

[view not recorded]
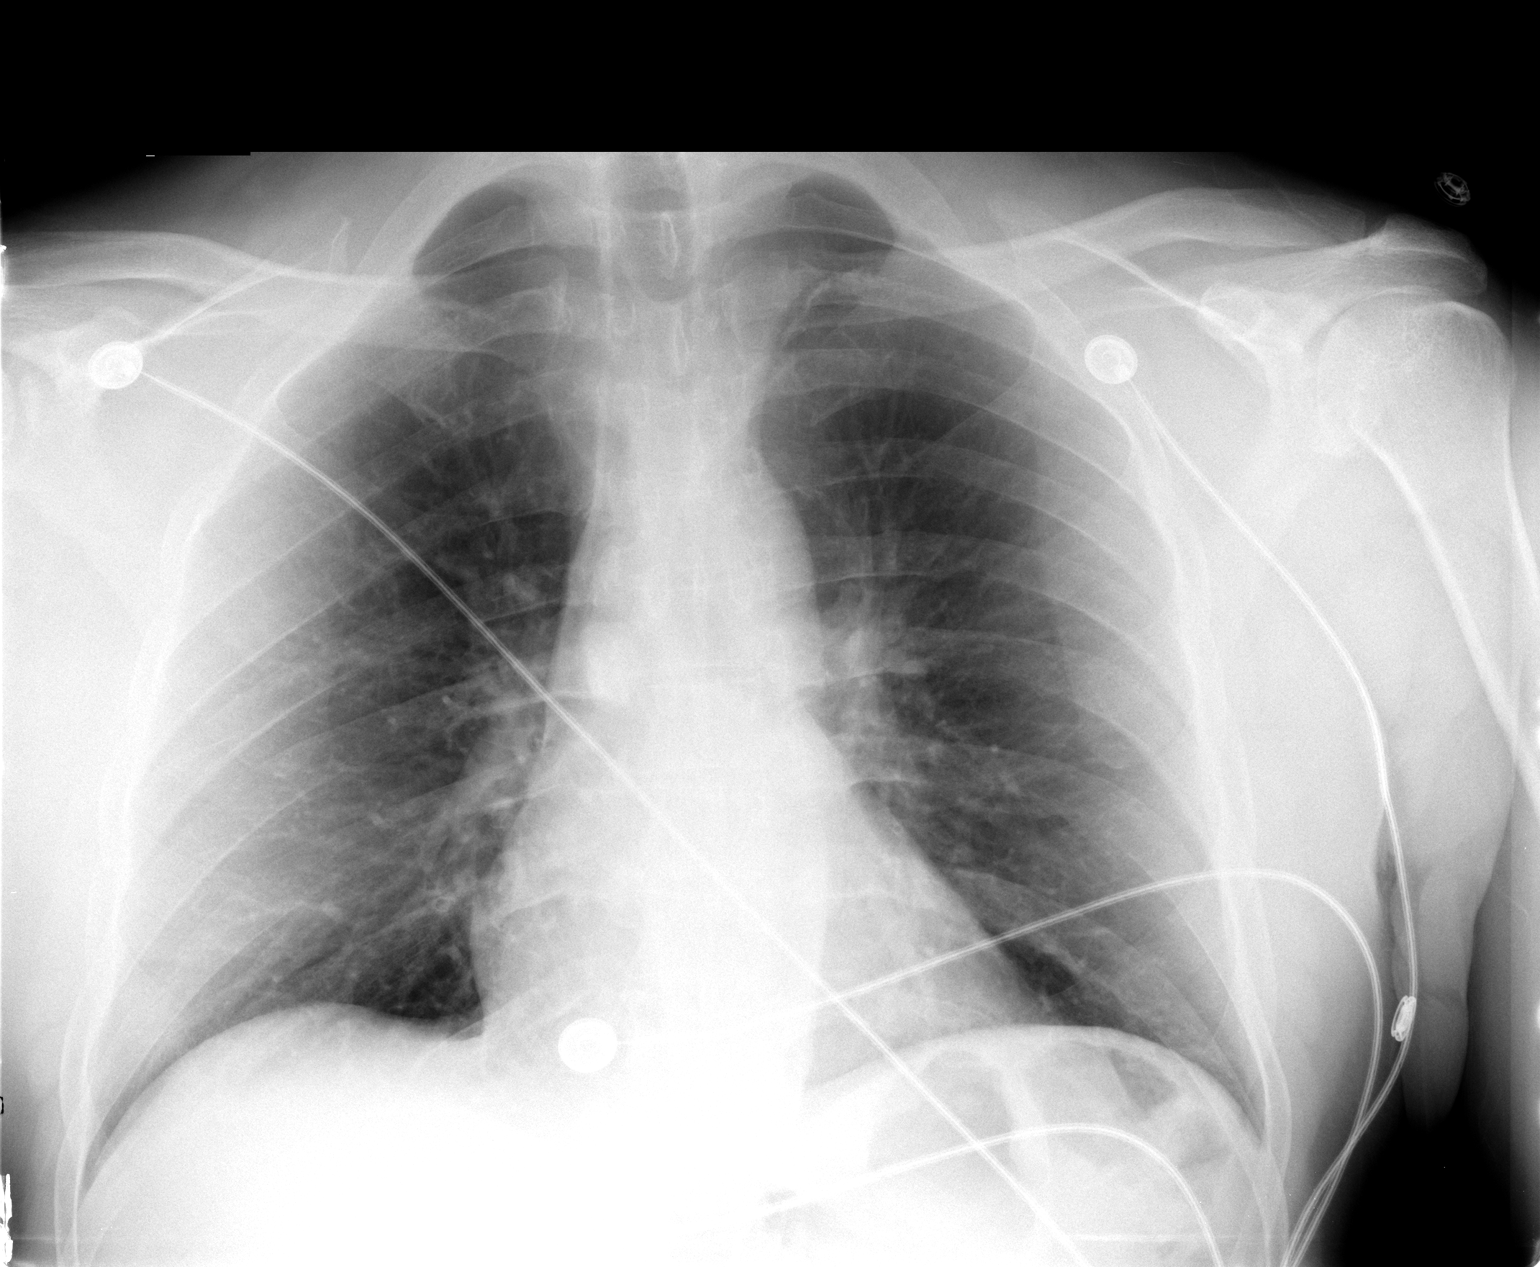

[1 of 1 positions shown; findings below may reference images not displayed]

FINDINGS: Heart and mediastinal contours normal.  Lungs clear.
Osseous structures intact in one-view.
IMPRESSION: No active disease in one-view.

## 2013-04-05 ENCOUNTER — Encounter (HOSPITAL_BASED_OUTPATIENT_CLINIC_OR_DEPARTMENT_OTHER): Payer: Self-pay

## 2013-04-05 ENCOUNTER — Emergency Department (HOSPITAL_BASED_OUTPATIENT_CLINIC_OR_DEPARTMENT_OTHER)
Admission: EM | Admit: 2013-04-05 | Discharge: 2013-04-05 | Disposition: A | Discharge status: Home / Self Care | Payer: Non-veteran care | Attending: Emergency Medicine | Admitting: Emergency Medicine

## 2013-04-05 ENCOUNTER — Emergency Department (HOSPITAL_BASED_OUTPATIENT_CLINIC_OR_DEPARTMENT_OTHER): Payer: Non-veteran care

## 2013-04-05 DIAGNOSIS — I129 Hypertensive chronic kidney disease with stage 1 through stage 4 chronic kidney disease, or unspecified chronic kidney disease: Secondary | ICD-10-CM | POA: Insufficient documentation

## 2013-04-05 DIAGNOSIS — N189 Chronic kidney disease, unspecified: Secondary | ICD-10-CM | POA: Insufficient documentation

## 2013-04-05 DIAGNOSIS — Z7902 Long term (current) use of antithrombotics/antiplatelets: Secondary | ICD-10-CM | POA: Insufficient documentation

## 2013-04-05 DIAGNOSIS — F329 Major depressive disorder, single episode, unspecified: Secondary | ICD-10-CM | POA: Insufficient documentation

## 2013-04-05 DIAGNOSIS — Z8639 Personal history of other endocrine, nutritional and metabolic disease: Secondary | ICD-10-CM | POA: Insufficient documentation

## 2013-04-05 DIAGNOSIS — Z7982 Long term (current) use of aspirin: Secondary | ICD-10-CM | POA: Insufficient documentation

## 2013-04-05 DIAGNOSIS — Z862 Personal history of diseases of the blood and blood-forming organs and certain disorders involving the immune mechanism: Secondary | ICD-10-CM | POA: Insufficient documentation

## 2013-04-05 DIAGNOSIS — R51 Headache: Secondary | ICD-10-CM | POA: Insufficient documentation

## 2013-04-05 DIAGNOSIS — Z87891 Personal history of nicotine dependence: Secondary | ICD-10-CM | POA: Insufficient documentation

## 2013-04-05 DIAGNOSIS — J45909 Unspecified asthma, uncomplicated: Secondary | ICD-10-CM | POA: Insufficient documentation

## 2013-04-05 DIAGNOSIS — Z794 Long term (current) use of insulin: Secondary | ICD-10-CM | POA: Insufficient documentation

## 2013-04-05 DIAGNOSIS — F3289 Other specified depressive episodes: Secondary | ICD-10-CM | POA: Insufficient documentation

## 2013-04-05 DIAGNOSIS — E119 Type 2 diabetes mellitus without complications: Secondary | ICD-10-CM | POA: Insufficient documentation

## 2013-04-05 DIAGNOSIS — Z79899 Other long term (current) drug therapy: Secondary | ICD-10-CM | POA: Insufficient documentation

## 2013-04-05 DIAGNOSIS — Z8719 Personal history of other diseases of the digestive system: Secondary | ICD-10-CM | POA: Insufficient documentation

## 2013-04-05 DIAGNOSIS — I2510 Atherosclerotic heart disease of native coronary artery without angina pectoris: Secondary | ICD-10-CM | POA: Insufficient documentation

## 2013-04-05 DIAGNOSIS — Z951 Presence of aortocoronary bypass graft: Secondary | ICD-10-CM | POA: Insufficient documentation

## 2013-04-05 MED ORDER — METOCLOPRAMIDE HCL 5 MG/ML IJ SOLN
10.0000 mg | Freq: Once | INTRAMUSCULAR | Status: AC
  Administered 2013-04-05: 10 mg via INTRAVENOUS
  Filled 2013-04-05: qty 2

## 2013-04-05 MED ORDER — METHYLPREDNISOLONE SODIUM SUCC 125 MG IJ SOLR
125.0000 mg | Freq: Once | INTRAMUSCULAR | Status: AC
  Administered 2013-04-05: 125 mg via INTRAVENOUS
  Filled 2013-04-05: qty 2

## 2013-04-05 MED ORDER — MORPHINE SULFATE 4 MG/ML IJ SOLN
4.0000 mg | Freq: Once | INTRAMUSCULAR | Status: AC
  Administered 2013-04-05: 4 mg via INTRAVENOUS
  Filled 2013-04-05: qty 1

## 2013-04-05 MED ORDER — HYDROCODONE-ACETAMINOPHEN 5-325 MG PO TABS: 2.0000 | ORAL_TABLET | ORAL | Status: DC | PRN

## 2013-04-05 NOTE — ED Notes (Signed)
Pt states HA began yesterday morning around 0830. No N/V/D or vision changes. Pt states "i felt kind of drowsy yesterday with a little weakness in both legs." Hx of HTN.

## 2013-04-05 NOTE — ED Provider Notes (Signed)
History     CSN: [id number]  Arrival date & time 04/05/13  8295   First MD Initiated Contact with Patient 04/05/13 1017      Chief Complaint  Patient presents with  . Headache    (Consider location/radiation/quality/duration/timing/severity/associated sxs/prior treatment) HPI Comments: The patient presents with complaints of bitemporal headache that started yesterday morning and has worsened.  No injury or trauma.  No prior history of headaches.  History of CAD with one stent, HTN.  Patient is a 52 y.o. male presenting with headaches. The history is provided by the patient.  Headache Pain location:  L temporal and R temporal Quality:  Stabbing Radiates to:  Does not radiate Onset quality:  Sudden Duration:  12 hours Timing:  Constant Progression:  Worsening Chronicity:  New Context: activity and bright light   Relieved by:  Nothing Worsened by:  Activity and light Ineffective treatments:  None tried Associated symptoms: no fever, no neck pain and no neck stiffness     Past Medical History  Diagnosis Date  . Diabetes mellitus, type 2   . Hypertension   . Asthma   . Pancreatitis   . Coronary artery disease     s/p PCI RCA '10, s/p PCI LAD '11  . Depressed   . HLD (hyperlipidemia)   . CKD (chronic kidney disease)     Past Surgical History  Procedure Laterality Date  . Cardiac catheterization    . Coronary stent placement      Family History  Problem Relation Age of Onset  . Heart disease Mother     MI 22s    History  Substance Use Topics  . Smoking status: Former Smoker    Types: Cigarettes    Quit date: 11/25/2007  . Smokeless tobacco: Never Used  . 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  HENT: Negative for neck pain and neck stiffness.   Neurological: Positive for headaches.  All other systems reviewed and are negative.    Allergies  Review of patient's  allergies indicates no known allergies.  Home Medications   Current Outpatient Rx  Name  Route  Sig  Dispense  Refill  . hydrochlorothiazide (HYDRODIURIL) 12.5 MG tablet   Oral   Take 12.5 mg by mouth daily.         Marland Kitchen albuterol (PROVENTIL HFA;VENTOLIN HFA) 108 (90 BASE) MCG/ACT inhaler   Inhalation   Inhale 2 puffs into the lungs every 6 (six) hours as needed. For wheezing and shortness of breath.         Marland Kitchen aspirin 81 MG tablet   Oral   Take 81 mg by mouth daily.         . benazepril-hydrochlorthiazide (LOTENSIN HCT) 10-12.5 MG per tablet   Oral   Take 1 tablet by mouth daily.         . budesonide-formoterol (SYMBICORT) 160-4.5 MCG/ACT inhaler   Inhalation   Inhale 2 puffs into the lungs 2 (two) times daily.         . clopidogrel (PLAVIX) 75 MG tablet   Oral   Take 75 mg by mouth daily.         Marland Kitchen ibuprofen (ADVIL,MOTRIN) 200 MG tablet   Oral   Take 400 mg by mouth every 6 (six) hours as needed. For headache.         . insulin glargine (LANTUS) 100 UNIT/ML  injection   Subcutaneous   Inject 30 Units into the skin daily.          . methylcellulose (ARTIFICIAL TEARS) 1 % ophthalmic solution   Both Eyes   Place 1 drop into both eyes as needed. For dry eyes         . Multiple Vitamin (MULTIVITAMIN) tablet   Oral   Take 1 tablet by mouth daily.         . nitroGLYCERIN (NITROSTAT) 0.4 MG SL tablet   Sublingual   Place 0.4 mg under the tongue every 5 (five) minutes as needed.         . Omega-3 Fatty Acids (FISH OIL) 1000 MG CAPS   Oral   Take 1 capsule by mouth 2 (two) times daily.         . QUEtiapine (SEROQUEL) 100 MG tablet   Oral   Take 100 mg by mouth at bedtime.         . Travoprost, BAK Free, (TRAVATAN) 0.004 % SOLN ophthalmic solution   Both Eyes   Place 1 drop into both eyes at bedtime.         . traZODone (DESYREL) 100 MG tablet   Oral   Take 100 mg by mouth at bedtime.           BP 114/63  Pulse 55  Temp(Src) 97.9  F (36.6 C) (Oral)  Resp 16  SpO2 100%  Physical Exam  Nursing note and vitals reviewed. Constitutional: He is oriented to person, place, and time. He appears well-developed and well-nourished. No distress.  HENT:  Head: Normocephalic and atraumatic.  Mouth/Throat: Oropharynx is clear and moist.  Eyes: EOM are normal. Pupils are equal, round, and reactive to light.  No papilledema on fundoscopic exam.  Neck: Normal range of motion. Neck supple.  Cardiovascular: Normal rate and regular rhythm.   No murmur heard. Pulmonary/Chest: Effort normal and breath sounds normal.  Musculoskeletal: Normal range of motion.  Lymphadenopathy:    He has no cervical adenopathy.  Neurological: He is alert and oriented to person, place, and time. No cranial nerve deficit. He exhibits normal muscle tone. Coordination normal.  Skin: Skin is warm and dry. He is not diaphoretic.    ED Course  Procedures (including critical care time)  Labs Reviewed - No data to display No results found.   No diagnosis found.    MDM  The ct scan is negative, the neuro exam is non-focal, and the patient feels better with the medications given.  I see no indication for LP at this time.  Will discharge with a few norco, to return prn for any problems.        Geoffery Lyons, MD 04/05/13 1144

## 2013-10-03 ENCOUNTER — Emergency Department (HOSPITAL_BASED_OUTPATIENT_CLINIC_OR_DEPARTMENT_OTHER)
Admission: EM | Admit: 2013-10-03 | Discharge: 2013-10-03 | Disposition: A | Discharge status: Home / Self Care | Payer: Non-veteran care | Attending: Emergency Medicine | Admitting: Emergency Medicine

## 2013-10-03 ENCOUNTER — Encounter (HOSPITAL_BASED_OUTPATIENT_CLINIC_OR_DEPARTMENT_OTHER): Payer: Self-pay | Admitting: Emergency Medicine

## 2013-10-03 DIAGNOSIS — M549 Dorsalgia, unspecified: Secondary | ICD-10-CM

## 2013-10-03 DIAGNOSIS — I129 Hypertensive chronic kidney disease with stage 1 through stage 4 chronic kidney disease, or unspecified chronic kidney disease: Secondary | ICD-10-CM | POA: Insufficient documentation

## 2013-10-03 DIAGNOSIS — Z8719 Personal history of other diseases of the digestive system: Secondary | ICD-10-CM | POA: Insufficient documentation

## 2013-10-03 DIAGNOSIS — M545 Low back pain, unspecified: Secondary | ICD-10-CM | POA: Insufficient documentation

## 2013-10-03 DIAGNOSIS — I2510 Atherosclerotic heart disease of native coronary artery without angina pectoris: Secondary | ICD-10-CM | POA: Insufficient documentation

## 2013-10-03 DIAGNOSIS — F329 Major depressive disorder, single episode, unspecified: Secondary | ICD-10-CM | POA: Insufficient documentation

## 2013-10-03 DIAGNOSIS — Z87891 Personal history of nicotine dependence: Secondary | ICD-10-CM | POA: Insufficient documentation

## 2013-10-03 DIAGNOSIS — Z7982 Long term (current) use of aspirin: Secondary | ICD-10-CM | POA: Insufficient documentation

## 2013-10-03 DIAGNOSIS — Z9861 Coronary angioplasty status: Secondary | ICD-10-CM | POA: Insufficient documentation

## 2013-10-03 DIAGNOSIS — N189 Chronic kidney disease, unspecified: Secondary | ICD-10-CM | POA: Insufficient documentation

## 2013-10-03 DIAGNOSIS — Z794 Long term (current) use of insulin: Secondary | ICD-10-CM | POA: Insufficient documentation

## 2013-10-03 DIAGNOSIS — F3289 Other specified depressive episodes: Secondary | ICD-10-CM | POA: Insufficient documentation

## 2013-10-03 DIAGNOSIS — Z79899 Other long term (current) drug therapy: Secondary | ICD-10-CM | POA: Insufficient documentation

## 2013-10-03 DIAGNOSIS — J45909 Unspecified asthma, uncomplicated: Secondary | ICD-10-CM | POA: Insufficient documentation

## 2013-10-03 DIAGNOSIS — E119 Type 2 diabetes mellitus without complications: Secondary | ICD-10-CM | POA: Insufficient documentation

## 2013-10-03 MED ORDER — CYCLOBENZAPRINE HCL 5 MG PO TABS: 5.0000 mg | ORAL_TABLET | Freq: Three times a day (TID) | ORAL | Status: DC | PRN

## 2013-10-03 MED ORDER — HYDROCODONE-ACETAMINOPHEN 5-325 MG PO TABS: 1.0000 | ORAL_TABLET | ORAL | Status: DC | PRN

## 2013-10-03 NOTE — ED Notes (Signed)
Lower back pain x 1 week s/p exercising

## 2013-10-03 NOTE — ED Notes (Signed)
MD at bedside. 

## 2013-10-03 NOTE — ED Provider Notes (Signed)
CSN: [id number]     Arrival date & time 10/03/13  4098 History   First MD Initiated Contact with Patient 10/03/13 0912     Chief Complaint  Patient presents with  . Back Pain   (Consider location/radiation/quality/duration/timing/severity/associated sxs/prior Treatment) HPI Comments: Pt c/o lower back pain time 1 week:pt states that he does a lot of heavy lifting at work and he thinks he pulled something and then he worked out to try and stretch it out and he thinks that he made it worse:denies numbness, weakness or dysuria:denies any falls:pt took some otc pain reliever without much relief  The history is provided by the patient. No language interpreter was used.    Past Medical History  Diagnosis Date  . Diabetes mellitus, type 2   . Hypertension   . Asthma   . Pancreatitis   . Coronary artery disease     s/p PCI RCA '10, s/p PCI LAD '11  . Depressed   . HLD (hyperlipidemia)   . CKD (chronic kidney disease)    Past Surgical History  Procedure Laterality Date  . Cardiac catheterization    . Coronary stent placement     Family History  Problem Relation Age of Onset  . Heart disease Mother     MI 3s   History  Substance Use Topics  . Smoking status: Former Smoker    Types: Cigarettes    Quit date: 11/25/2007  . Smokeless tobacco: Never Used  . 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   Respiratory: Negative.   Cardiovascular: Negative.     Allergies  Review of patient's allergies indicates no known allergies.  Home Medications   Current Outpatient Rx  Name  Route  Sig  Dispense  Refill  . albuterol (PROVENTIL HFA;VENTOLIN HFA) 108 (90 BASE) MCG/ACT inhaler   Inhalation   Inhale 2 puffs into the lungs every 6 (six) hours as needed. For wheezing and shortness of breath.         Marland Kitchen aspirin 81 MG tablet   Oral   Take 81 mg by mouth daily.         . budesonide-formoterol (SYMBICORT)  160-4.5 MCG/ACT inhaler   Inhalation   Inhale 2 puffs into the lungs 2 (two) times daily.         . cyclobenzaprine (FLEXERIL) 5 MG tablet   Oral   Take 1 tablet (5 mg total) by mouth 3 (three) times daily as needed for muscle spasms.   20 tablet   0   . HYDROcodone-acetaminophen (NORCO/VICODIN) 5-325 MG per tablet   Oral   Take 1-2 tablets by mouth every 4 (four) hours as needed.   15 tablet   0   . insulin glargine (LANTUS) 100 UNIT/ML injection   Subcutaneous   Inject 30 Units into the skin daily.          . methylcellulose (ARTIFICIAL TEARS) 1 % ophthalmic solution   Both Eyes   Place 1 drop into both eyes as needed. For dry eyes         . Multiple Vitamin (MULTIVITAMIN) tablet   Oral   Take 1 tablet by mouth daily.         . Omega-3 Fatty Acids (FISH OIL) 1000 MG CAPS   Oral   Take 1 capsule by mouth 2 (two) times daily.         . Travoprost, BAK  Free, (TRAVATAN) 0.004 % SOLN ophthalmic solution   Both Eyes   Place 1 drop into both eyes at bedtime.         . traZODone (DESYREL) 100 MG tablet   Oral   Take 100 mg by mouth at bedtime.          BP 132/62  Pulse 56  Temp(Src) 97.6 F (36.4 C) (Oral)  Resp 16  Ht 5\' 11"  (1.803 m)  Wt 175 lb (79.379 kg)  BMI 24.42 kg/m2  SpO2 100% Physical Exam  Nursing note and vitals reviewed. Constitutional: He is oriented to person, place, and time. He appears well-developed and well-nourished.  Cardiovascular: Normal rate and regular rhythm.   Pulmonary/Chest: Effort normal and breath sounds normal.  Musculoskeletal: Normal range of motion.  Lumbar paraspinal tenderness:moving all extremities without any problem  Neurological: He is alert and oriented to person, place, and time. He exhibits normal muscle tone. Coordination normal.    ED Course  Procedures (including critical care time) Labs Review Labs Reviewed - No data to display Imaging Review No results found.  EKG Interpretation   None        MDM   1. Back pain    Pt is neurologically intact:pt treated symptomatically with vicodin and flexeril and given follow    Teressa Lower, NP 10/03/13 1101

## 2013-10-04 NOTE — ED Provider Notes (Signed)
Medical screening examination/treatment/procedure(s) were performed by non-physician practitioner and as supervising physician I was immediately available for consultation/collaboration.    Chantell Kunkler L Brigitta Pricer, MD 10/04/13 2315 

## 2013-11-27 ENCOUNTER — Emergency Department (HOSPITAL_BASED_OUTPATIENT_CLINIC_OR_DEPARTMENT_OTHER): Payer: Non-veteran care

## 2013-11-27 ENCOUNTER — Emergency Department (HOSPITAL_BASED_OUTPATIENT_CLINIC_OR_DEPARTMENT_OTHER)
Admission: EM | Admit: 2013-11-27 | Discharge: 2013-11-27 | Disposition: A | Discharge status: Home / Self Care | Payer: Non-veteran care | Attending: Emergency Medicine | Admitting: Emergency Medicine

## 2013-11-27 ENCOUNTER — Encounter (HOSPITAL_BASED_OUTPATIENT_CLINIC_OR_DEPARTMENT_OTHER): Payer: Self-pay | Admitting: Emergency Medicine

## 2013-11-27 DIAGNOSIS — Z7982 Long term (current) use of aspirin: Secondary | ICD-10-CM | POA: Insufficient documentation

## 2013-11-27 DIAGNOSIS — Z794 Long term (current) use of insulin: Secondary | ICD-10-CM | POA: Insufficient documentation

## 2013-11-27 DIAGNOSIS — Z79899 Other long term (current) drug therapy: Secondary | ICD-10-CM | POA: Insufficient documentation

## 2013-11-27 DIAGNOSIS — F3289 Other specified depressive episodes: Secondary | ICD-10-CM | POA: Insufficient documentation

## 2013-11-27 DIAGNOSIS — Z8719 Personal history of other diseases of the digestive system: Secondary | ICD-10-CM | POA: Insufficient documentation

## 2013-11-27 DIAGNOSIS — Z95818 Presence of other cardiac implants and grafts: Secondary | ICD-10-CM | POA: Insufficient documentation

## 2013-11-27 DIAGNOSIS — J159 Unspecified bacterial pneumonia: Secondary | ICD-10-CM | POA: Insufficient documentation

## 2013-11-27 DIAGNOSIS — Z9861 Coronary angioplasty status: Secondary | ICD-10-CM | POA: Insufficient documentation

## 2013-11-27 DIAGNOSIS — J189 Pneumonia, unspecified organism: Secondary | ICD-10-CM

## 2013-11-27 DIAGNOSIS — R197 Diarrhea, unspecified: Secondary | ICD-10-CM | POA: Insufficient documentation

## 2013-11-27 DIAGNOSIS — Z87891 Personal history of nicotine dependence: Secondary | ICD-10-CM | POA: Insufficient documentation

## 2013-11-27 DIAGNOSIS — I129 Hypertensive chronic kidney disease with stage 1 through stage 4 chronic kidney disease, or unspecified chronic kidney disease: Secondary | ICD-10-CM | POA: Insufficient documentation

## 2013-11-27 DIAGNOSIS — E119 Type 2 diabetes mellitus without complications: Secondary | ICD-10-CM | POA: Insufficient documentation

## 2013-11-27 DIAGNOSIS — F329 Major depressive disorder, single episode, unspecified: Secondary | ICD-10-CM | POA: Insufficient documentation

## 2013-11-27 DIAGNOSIS — N189 Chronic kidney disease, unspecified: Secondary | ICD-10-CM | POA: Insufficient documentation

## 2013-11-27 DIAGNOSIS — I2510 Atherosclerotic heart disease of native coronary artery without angina pectoris: Secondary | ICD-10-CM | POA: Insufficient documentation

## 2013-11-27 MED ORDER — CEFTRIAXONE SODIUM 1 G IJ SOLR
1.0000 g | Freq: Once | INTRAMUSCULAR | Status: AC
  Administered 2013-11-27: 1 g via INTRAMUSCULAR
  Filled 2013-11-27: qty 10

## 2013-11-27 MED ORDER — IBUPROFEN 800 MG PO TABS
800.0000 mg | ORAL_TABLET | Freq: Once | ORAL | Status: AC
  Administered 2013-11-27: 800 mg via ORAL
  Filled 2013-11-27: qty 1

## 2013-11-27 MED ORDER — AZITHROMYCIN 250 MG PO TABS
500.0000 mg | ORAL_TABLET | Freq: Once | ORAL | Status: AC
  Administered 2013-11-27: 500 mg via ORAL
  Filled 2013-11-27: qty 2

## 2013-11-27 MED ORDER — AZITHROMYCIN 250 MG PO TABS: 250.0000 mg | ORAL_TABLET | Freq: Every day | ORAL | Status: DC

## 2013-11-27 MED ORDER — LIDOCAINE HCL (PF) 1 % IJ SOLN
INTRAMUSCULAR | Status: AC
  Administered 2013-11-27: 2.3 mL
  Filled 2013-11-27: qty 5

## 2013-11-27 NOTE — ED Notes (Signed)
Runny nose, cough, fever, body aches, chills since Friday.  Productive white sputum.  Pt states otc medications are not helping.

## 2013-11-27 NOTE — Discharge Instructions (Signed)
Pneumonia, Adult °Pneumonia is an infection of the lungs.  °CAUSES °Pneumonia may be caused by bacteria or a virus. Usually, these infections are caused by breathing infectious particles into the lungs (respiratory tract). °SYMPTOMS  °· Cough. °· Fever. °· Chest pain. °· Increased rate of breathing. °· Wheezing. °· Mucus production. °DIAGNOSIS  °If you have the common symptoms of pneumonia, your caregiver will typically confirm the diagnosis with a chest X-ray. The X-ray will show an abnormality in the lung (pulmonary infiltrate) if you have pneumonia. Other tests of your blood, urine, or sputum may be done to find the specific cause of your pneumonia. Your caregiver may also do tests (blood gases or pulse oximetry) to see how well your lungs are working. °TREATMENT  °Some forms of pneumonia may be spread to other people when you cough or sneeze. You may be asked to wear a mask before and during your exam. Pneumonia that is caused by bacteria is treated with antibiotic medicine. Pneumonia that is caused by the influenza virus may be treated with an antiviral medicine. Most other viral infections must run their course. These infections will not respond to antibiotics.  °PREVENTION °A pneumococcal shot (vaccine) is available to prevent a common bacterial cause of pneumonia. This is usually suggested for: °· People over 65 years old. °· Patients on chemotherapy. °· People with chronic lung problems, such as bronchitis or emphysema. °· People with immune system problems. °If you are over 65 or have a high risk condition, you may receive the pneumococcal vaccine if you have not received it before. In some countries, a routine influenza vaccine is also recommended. This vaccine can help prevent some cases of pneumonia. You may be offered the influenza vaccine as part of your care. °If you smoke, it is time to quit. You may receive instructions on how to stop smoking. Your caregiver can provide medicines and counseling to  help you quit. °HOME CARE INSTRUCTIONS  °· Cough suppressants may be used if you are losing too much rest. However, coughing protects you by clearing your lungs. You should avoid using cough suppressants if you can. °· Your caregiver may have prescribed medicine if he or she thinks your pneumonia is caused by a bacteria or influenza. Finish your medicine even if you start to feel better. °· Your caregiver may also prescribe an expectorant. This loosens the mucus to be coughed up. °· Only take over-the-counter or prescription medicines for pain, discomfort, or fever as directed by your caregiver. °· Do not smoke. Smoking is a common cause of bronchitis and can contribute to pneumonia. If you are a smoker and continue to smoke, your cough may last several weeks after your pneumonia has cleared. °· A cold steam vaporizer or humidifier in your room or home may help loosen mucus. °· Coughing is often worse at night. Sleeping in a semi-upright position in a recliner or using a couple pillows under your head will help with this. °· Get rest as you feel it is needed. Your body will usually let you know when you need to rest. °SEEK IMMEDIATE MEDICAL CARE IF:  °· Your illness becomes worse. This is especially true if you are elderly or weakened from any other disease. °· You cannot control your cough with suppressants and are losing sleep. °· You begin coughing up blood. °· You develop pain which is getting worse or is uncontrolled with medicines. °· You have a fever. °· Any of the symptoms which initially brought you in for treatment   are getting worse rather than better. °· You develop shortness of breath or chest pain. °MAKE SURE YOU:  °· Understand these instructions. °· Will watch your condition. °· Will get help right away if you are not doing well or get worse. °Document Released: 11/10/2005 Document Revised: 02/02/2012 Document Reviewed: 01/30/2011 °ExitCare® Patient Information ©2014 [employer]. ° ° °Emergency  Department Resource Guide °1) Find a Doctor and Pay Out of Pocket °Although you won't have to find out who is covered by your insurance plan, it is a good idea to ask around and get recommendations. You will then need to call the office and see if the doctor you have chosen will accept you as a new patient and what types of options they offer for patients who are self-pay. Some doctors offer discounts or will set up payment plans for their patients who do not have insurance, but you will need to ask so you aren't surprised when you get to your appointment. ° °2) Contact Your Local Health Department °Not all health departments have doctors that can see patients for sick visits, but many do, so it is worth a call to see if yours does. If you don't know where your local health department is, you can check in your phone book. The CDC also has a tool to help you locate your state's health department, and many state websites also have listings of all of their local health departments. ° °3) Find a Walk-in Clinic °If your illness is not likely to be very severe or complicated, you may want to try a walk in clinic. These are popping up all over the country in pharmacies, drugstores, and shopping centers. They're usually staffed by nurse practitioners or physician assistants that have been trained to treat common illnesses and complaints. They're usually fairly quick and inexpensive. However, if you have serious medical issues or chronic medical problems, these are probably not your best option. ° °No Primary Care Doctor: °- Call [facility] at  832-8000 - they can help you locate a primary care doctor that  accepts your insurance, provides certain services, etc. °- Physician Referral Service- 1-800-533-3463 ° °Chronic Pain Problems: °Organization         Address  Phone   Notes  °[facility]  (336) 297-2271 Patients need to be referred by their primary care doctor.  ° °Medication  Assistance: °Organization         Address  Phone   Notes  °[place] [facility] [address] °[place], [place] [address] (336) 641-8030 --Must be a resident of [place] °-- Must have NO insurance coverage whatsoever (no Medicaid/ Medicare, etc.) °-- The pt. MUST have a primary care doctor that directs their care regularly and follows them in the community °  °[facility]  (866) 331-1348   °[employer]  (888) 892-1162   ° °Agencies that provide inexpensive medical care: °Organization         Address  Phone   Notes  °[facility]  (336) 832-8035   °[facility]    (336) 832-7272   °[facility] [address] °[place], [place] [address] (336) 832-4777   °[facility] [address], °[place] (336) 271-4999   °[facility]    (336) 373-0678   °[facility]    (336) 272-1050   °[facility] ° [address], [place] Phone:  (336) 832-4444,   Fax:  (336) 832-4440 Hours of Operation:  9 am - 6 pm, M-F.  Also accepts Medicaid/Medicare and self-pay.  °[facility] ° [address], [place] Phone: (336) 832-3150, Fax: (336) 832-3151. Hours of Operation:  8:30 am - 5:30 pm, M-F.  Also accepts Medicaid and self-pay.  °[facility] [place] [address], [place] Phone: (336) 878-6027   °[facility] [address], [place], [place] (336)723-1848, Ext. 123 Mondays & Thursdays: 7-9 AM.  First 15 patients are seen on a first come, first serve basis. °  ° °Medicaid-accepting [place] Providers: ° °Organization         Address  Phone   Notes  °[facility] [address], [place] (336) 641-2100 Also accepts self-pay patients.  °[facility] [address], [place] ° (336) 856-9996   °[facility] [address], [place]  (336) 288-8857   °[facility] [address] [place] [address], [place] (336) 299-7000   °Veita Bland [address], [place]  ° (336) 373-1557 Only accepts [place] Access Medicaid patients after they have their name applied to their card.  ° °Self-Pay (no insurance) in [place]: ° °Organization         Address  Phone   Notes  °Sickle Cell Patients, [facility] [address], [place] (336) 832-1970   °[facility] [address], [place] (336) 832-4400   °[facility] ° [address], [place] (336) 992-4800   °[facility]/Dr. Osei-Bonsu ° [address] [place] [address], [place] or [address], [place] (336) 841-8500 Phone number for both [place] and [place] locations is the same.  °[facility] [address], [place] (336) 299-0000   °[facility] [address] [place] [address], [place] or [address] (336) 852-7530 °(336) 878-2260   °[facility] [address], [place] (336) 350-1642, phone; (336) 294-5005, fax Sees patients 1st and 3rd Saturday of every month.  Must not qualify for public or private insurance (i.e. Medicaid, Medicare, [place] Health Choice, Veterans' Benefits) • Household income should be no more than 200% of the poverty level •The clinic cannot treat you if you are pregnant or think you are pregnant • Sexually transmitted diseases are not treated at the clinic.  ° ° °Dental Care: °Organization         Address  Phone  Notes  °[place] [facility] [address], [place] (336) 641-6152 Accepts children up to age 21 who are enrolled in Medicaid or [place] Health Choice; pregnant women with a Medicaid card; and children who have applied for Medicaid or [place] Health Choice, but were declined, whose parents can pay a reduced fee at time of service.  °[place]  [facility] [place]  [address], [place] (336) 641-7733 Accepts children up to age 21 who are enrolled in Medicaid or [place] Health Choice; pregnant women with a Medicaid card; and children who have applied for Medicaid or [place] Health Choice, but were declined, whose parents can pay a reduced fee at time of service.  °[facility] ° [address], [place] (336) 641-4533 Patients are seen by appointment only. Walk-ins are not   accepted. [facility] will see patients 18 years of age and older. °Monday - Tuesday (8am-5pm) °Most Wednesdays (8:30-5pm) °$30 per visit, cash only  °[facility] ° [address], [place] (336) 641-4533 Patients are seen by appointment only. Walk-ins are not accepted. [facility] will see patients 18 years of age and older. °One Wednesday Evening (Monthly: Volunteer Based).  $30 per visit, cash only  °[facility]  (919) 537-3737 for adults; Children under age 4, call Graduate Pediatric Dentistry at (919) [phone]. Children aged 4-14, please call (919) [phone] to request a pediatric application. ° Dental services are provided in all areas of dental care including fillings, crowns and bridges, complete and partial dentures, implants, gum treatment, root canals, and extractions. Preventive care is also provided. Treatment is provided to both adults and children. °Patients are selected via a lottery and there is often a waiting list. °  °[facility] [address], °[place] ° (336) 763-8833 [email or web address] °  °[facility] [address], [place], [place] (336)723-1848, Ext. 123 Second and Fourth Thursday of each month, opens at 6:30 AM; Clinic ends at 9 AM.  Patients are seen on a first-come first-served basis, and a limited number are seen during each clinic.  ° °[facility] ° [address], [place], [place] (336) 723-7904    Eligibility Requirements °You must have lived in [place], [place], or [place] counties for at least the last three months. °  You cannot be eligible for state or federal sponsored healthcare insurance, including Veterans Administration, Medicaid, or Medicare. °  You generally cannot be eligible for healthcare insurance through your employer.  °  How to apply: °Eligibility screenings are held every Tuesday and Wednesday afternoon from 1:00 pm until 4:00 pm. You do not need an appointment for the interview!  °[facility] [address], [place], [place] 336-631-2330   °[facility]  336-342-8273   °[facility]  336-703-3100   °[facility]  336-570-6415   ° °Behavioral Health Resources in the Community: °Intensive Outpatient Programs °Organization         Address  Phone  Notes  °[facility] [address], [place], [place] 336-878-6098   °[facility] [address], [place], [place] 336-832-9800   °[facility] [address], [place], [place] ° 336-882-2125   °[facility] [address],  °[place], [place] 1-800-853-5163 or 336-641-4981   °Substance Abuse Resources °Organization         Address  Phone  Notes  °[facility]  336-882-2125   °[facility]  336-784-9470   °[facility]  336-285-9073   °[facility]  336-845-3988   °Residential & Outpatient Substance Abuse Program  1-800-659-3381   °Psychological Services °Organization         Address  Phone  Notes  ° Health  336- 832-9600   °[facility]  336- 378-7881   °[facility] [address], [place] 1-800-853-5163 or 336-641-4981   ° °Mobile Crisis Teams °Organization         Address  Phone  Notes  °[facility], Mobile Crisis Care Unit  1-877-626-1772   °Assertive °[facility] ° [address]  [place], [place] 336-834-9664   °Sharon DeEsch [address] ° [place] 336-554-5454   ° °Self-Help/Support Groups °  Organization         Address  Phone             Notes  °[facility] - variety of support groups  336- 373-1402 Call for more information  °Narcotics Anonymous (NA), [facility] [address], °[place]  2 meetings at this location  ° °Residential Treatment Programs °Organization         Address  Phone  Notes  °[facility] [address],    °[place]  1-866-801-8205   °[facility] ° [address], [place], [place] 704-293-8524   °[facility] [address], [place] 336-845-3988 Admissions: 8am-3pm M-F  °[facility] [address]    °[place], [place] 336-841-1104   °[facility] [address], [place], [place] 336-379-7146   °[facility] [address]  °[place], [place] 336-285-9073   °[facility] Intensive Outpatient [address], [place], [place] 336-852-3033   °[facility] [address]  °[place], [place] 1-877-615-2722 or 336-784-9470   °[facility] [address] [place], [place] 336-227-7417 Accepts Medicaid  °[facility] [address]  °[place] 1-800-659-3381 Substance Abuse/Addiction Treatment  ° °[place] Behavioral Health Resources °Organization         Address  Phone  Notes  °[facility]  (888) 581-9988   °Julie Brannon, PhD [address] [place], [place]   (336) 349-5553 or (336) 951-0000   °[facility]   [address] °[place], [place] (336) 349-4454   °[facility] [address], [place], [place] (336) 342-8316 Insurance/Medicaid/sponsorship through [facility]  °[facility] [address]                                    [place], [place] (336) 342-8316 Therapy/tele-psych/case    °[facility] [address]  ° [place], [place] (336) 349-2233    °Dr. Arfeen  (336) 349-4544   °[facility] [place]  [employer] [place] [facility] 1) [address], [place] °2) [address], [place] °3)  [address], [place] (336) 349-3220 °(336) 342-7768 ° °(336) 342-8140   °[place] [facility] (336) 342-1394 or (336) 342-3537 (After Hours)    ° ° ° °

## 2013-11-27 NOTE — ED Provider Notes (Signed)
CSN: [id number]     Arrival date & time 11/27/13  0820 History   First MD Initiated Contact with Patient 11/27/13 [phone]     Chief Complaint  Patient presents with  . Cough  . Generalized Body Aches  . Fever   (Consider location/radiation/quality/duration/timing/severity/associated sxs/prior Treatment) Patient is a 53 y.o. male presenting with cough and fever. The history is provided by the patient.  Cough Cough characteristics:  Productive Sputum characteristics:  White Severity:  Mild Onset quality:  Sudden Duration:  2 days Timing:  Constant Progression:  Unchanged Chronicity:  New Smoker: no   Context: not sick contacts, not upper respiratory infection and not weather changes   Relieved by:  Nothing Worsened by:  Nothing tried Associated symptoms: chills and rhinorrhea   Associated symptoms: no chest pain, no ear pain, no fever and no shortness of breath   Fever Associated symptoms: chills, congestion, cough (with white sputum), diarrhea (mild) and rhinorrhea   Associated symptoms: no chest pain, no ear pain, no nausea and no vomiting     Past Medical History  Diagnosis Date  . Diabetes mellitus, type 2   . Hypertension   . Asthma   . Pancreatitis   . Coronary artery disease     s/p PCI RCA '10, s/p PCI LAD '11  . Depressed   . HLD (hyperlipidemia)   . CKD (chronic kidney disease)    Past Surgical History  Procedure Laterality Date  . Cardiac catheterization    . Coronary stent placement     Family History  Problem Relation Age of Onset  . Heart disease Mother     MI 55s   History  Substance Use Topics  . Smoking status: Former Smoker    Types: Cigarettes    Quit date: 11/25/2007  . Smokeless tobacco: Never Used  . 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 Negative for fever.  HENT: Positive for congestion, rhinorrhea and sinus pressure. Negative for ear discharge and ear  pain.   Respiratory: Positive for cough (with white sputum). Negative for shortness of breath.   Cardiovascular: Negative for chest pain and leg swelling.  Gastrointestinal: Positive for diarrhea (mild). Negative for nausea, vomiting and abdominal pain.  All other systems reviewed and are negative.    Allergies  Review of patient's allergies indicates no known allergies.  Home Medications   Current Outpatient Rx  Name  Route  Sig  Dispense  Refill  . albuterol (PROVENTIL HFA;VENTOLIN HFA) 108 (90 BASE) MCG/ACT inhaler   Inhalation   Inhale 2 puffs into the lungs every 6 (six) hours as needed. For wheezing and shortness of breath.         Marland Kitchen aspirin 81 MG tablet   Oral   Take 81 mg by mouth daily.         . budesonide-formoterol (SYMBICORT) 160-4.5 MCG/ACT inhaler   Inhalation   Inhale 2 puffs into the lungs 2 (two) times daily.         . insulin glargine (LANTUS) 100 UNIT/ML injection   Subcutaneous   Inject 30 Units into the skin daily.          . methylcellulose (ARTIFICIAL TEARS) 1 % ophthalmic solution   Both Eyes   Place 1 drop into both eyes as needed. For dry eyes         . Multiple Vitamin (MULTIVITAMIN) tablet   Oral  Take 1 tablet by mouth daily.         . Omega-3 Fatty Acids (FISH OIL) 1000 MG CAPS   Oral   Take 1 capsule by mouth 2 (two) times daily.         . Travoprost, BAK Free, (TRAVATAN) 0.004 % SOLN ophthalmic solution   Both Eyes   Place 1 drop into both eyes at bedtime.         . traZODone (DESYREL) 100 MG tablet   Oral   Take 100 mg by mouth at bedtime.          BP 140/70  Pulse 81  Temp(Src) 98.3 F (36.8 C) (Oral)  Resp 16  Ht 5\' 11"  (1.803 m)  Wt 180 lb (81.647 kg)  BMI 25.12 kg/m2  SpO2 96% Physical Exam  Nursing note and vitals reviewed. Constitutional: He is oriented to person, place, and time. He appears well-developed and well-nourished. No distress.  HENT:  Head: Normocephalic and atraumatic.   Mouth/Throat: No oropharyngeal exudate.  Eyes: EOM are normal. Pupils are equal, round, and reactive to light.  Neck: Normal range of motion. Neck supple.  Cardiovascular: Normal rate and regular rhythm.  Exam reveals no friction rub.   No murmur heard. Pulmonary/Chest: Effort normal and breath sounds normal. No respiratory distress. He has no wheezes. He has no rales.  Abdominal: He exhibits no distension. There is no tenderness. There is no rebound.  Musculoskeletal: Normal range of motion. He exhibits no edema.  Neurological: He is alert and oriented to person, place, and time. No cranial nerve deficit. He exhibits normal muscle tone. Coordination normal.  Skin: No rash noted. He is not diaphoretic.    ED Course  Procedures (including critical care time) Labs Review Labs Reviewed - No data to display Imaging Review No results found.  EKG Interpretation   None       MDM   1. Community acquired pneumonia    SUBJECTIVE:  Dolores PattyOscar Heinzel is a 53 y.o. male who present complaining of flu-like symptoms: chills, myalgias, congestion, sore throat and cough for 2 days. Denies dyspnea or wheezing.  OBJECTIVE: Appears moderately ill but not toxic; temperature as noted in vitals. Ears normal. Throat and pharynx normal.  Neck supple. No adenopathy in the neck. Sinuses non tender. The chest is clear.  ASSESSMENT: Influenza-like illness  PLAN: CXR to look for pneumonia, and chest x-ray does show pneumonia. Given Rocephin and azithromycin. Vitals stable. Stable for discharge    Dagmar HaitWilliam Brienna Bass, MD 11/27/13 [phone]

## 2013-12-06 ENCOUNTER — Encounter (HOSPITAL_BASED_OUTPATIENT_CLINIC_OR_DEPARTMENT_OTHER): Payer: Self-pay | Admitting: Emergency Medicine

## 2013-12-06 ENCOUNTER — Emergency Department (HOSPITAL_BASED_OUTPATIENT_CLINIC_OR_DEPARTMENT_OTHER)
Admission: EM | Admit: 2013-12-06 | Discharge: 2013-12-07 | Disposition: A | Discharge status: Home / Self Care | Payer: Non-veteran care | Attending: Emergency Medicine | Admitting: Emergency Medicine

## 2013-12-06 ENCOUNTER — Emergency Department (HOSPITAL_BASED_OUTPATIENT_CLINIC_OR_DEPARTMENT_OTHER): Payer: Non-veteran care

## 2013-12-06 DIAGNOSIS — Z95818 Presence of other cardiac implants and grafts: Secondary | ICD-10-CM | POA: Insufficient documentation

## 2013-12-06 DIAGNOSIS — Z794 Long term (current) use of insulin: Secondary | ICD-10-CM | POA: Insufficient documentation

## 2013-12-06 DIAGNOSIS — E119 Type 2 diabetes mellitus without complications: Secondary | ICD-10-CM | POA: Insufficient documentation

## 2013-12-06 DIAGNOSIS — F329 Major depressive disorder, single episode, unspecified: Secondary | ICD-10-CM | POA: Insufficient documentation

## 2013-12-06 DIAGNOSIS — Z87891 Personal history of nicotine dependence: Secondary | ICD-10-CM | POA: Insufficient documentation

## 2013-12-06 DIAGNOSIS — J189 Pneumonia, unspecified organism: Secondary | ICD-10-CM | POA: Insufficient documentation

## 2013-12-06 DIAGNOSIS — I129 Hypertensive chronic kidney disease with stage 1 through stage 4 chronic kidney disease, or unspecified chronic kidney disease: Secondary | ICD-10-CM | POA: Insufficient documentation

## 2013-12-06 DIAGNOSIS — F3289 Other specified depressive episodes: Secondary | ICD-10-CM | POA: Insufficient documentation

## 2013-12-06 DIAGNOSIS — Z7982 Long term (current) use of aspirin: Secondary | ICD-10-CM | POA: Insufficient documentation

## 2013-12-06 DIAGNOSIS — Z792 Long term (current) use of antibiotics: Secondary | ICD-10-CM | POA: Insufficient documentation

## 2013-12-06 DIAGNOSIS — N189 Chronic kidney disease, unspecified: Secondary | ICD-10-CM | POA: Insufficient documentation

## 2013-12-06 DIAGNOSIS — Z9861 Coronary angioplasty status: Secondary | ICD-10-CM | POA: Insufficient documentation

## 2013-12-06 DIAGNOSIS — R079 Chest pain, unspecified: Secondary | ICD-10-CM

## 2013-12-06 DIAGNOSIS — R0789 Other chest pain: Secondary | ICD-10-CM | POA: Insufficient documentation

## 2013-12-06 DIAGNOSIS — Z8719 Personal history of other diseases of the digestive system: Secondary | ICD-10-CM | POA: Insufficient documentation

## 2013-12-06 DIAGNOSIS — Z79899 Other long term (current) drug therapy: Secondary | ICD-10-CM | POA: Insufficient documentation

## 2013-12-06 DIAGNOSIS — J45901 Unspecified asthma with (acute) exacerbation: Secondary | ICD-10-CM | POA: Insufficient documentation

## 2013-12-06 DIAGNOSIS — I2510 Atherosclerotic heart disease of native coronary artery without angina pectoris: Secondary | ICD-10-CM | POA: Insufficient documentation

## 2013-12-06 LAB — COMPREHENSIVE METABOLIC PANEL
ALBUMIN: 3.7 g/dL (ref 3.5–5.2)
ALT: 61 U/L — AB (ref 0–53)
AST: 42 U/L — AB (ref 0–37)
Alkaline Phosphatase: 73 U/L (ref 39–117)
BUN: 31 mg/dL — ABNORMAL HIGH (ref 6–23)
CHLORIDE: 102 meq/L (ref 96–112)
CO2: 26 mEq/L (ref 19–32)
Calcium: 9.9 mg/dL (ref 8.4–10.5)
Creatinine, Ser: 1.5 mg/dL — ABNORMAL HIGH (ref 0.50–1.35)
GFR calc Af Amer: 59 mL/min — ABNORMAL LOW (ref >90)
GFR, EST NON AFRICAN AMERICAN: 51 mL/min — AB (ref >90)
Glucose, Bld: 210 mg/dL — ABNORMAL HIGH (ref 70–99)
Potassium: 4.2 mEq/L (ref 3.7–5.3)
SODIUM: 140 meq/L (ref 137–147)
Total Bilirubin: 0.2 mg/dL — ABNORMAL LOW (ref 0.3–1.2)
Total Protein: 6.9 g/dL (ref 6.0–8.3)

## 2013-12-06 LAB — CBC
HCT: 39 % (ref 39.0–52.0)
Hemoglobin: 13.9 g/dL (ref 13.0–17.0)
MCH: 27.6 pg (ref 26.0–34.0)
MCHC: 35.6 g/dL (ref 30.0–36.0)
MCV: 77.5 fL — ABNORMAL LOW (ref 78.0–100.0)
PLATELETS: 177 10*3/uL (ref 150–400)
RBC: 5.03 MIL/uL (ref 4.22–5.81)
RDW: 13.2 % (ref 11.5–15.5)
WBC: 6.4 10*3/uL (ref 4.0–10.5)

## 2013-12-06 LAB — APTT: aPTT: 27 seconds (ref 24–37)

## 2013-12-06 LAB — PROTIME-INR
INR: 0.99 (ref 0.00–1.49)
Prothrombin Time: 12.9 seconds (ref 11.6–15.2)

## 2013-12-06 LAB — TROPONIN I: Troponin I: <0.3 ng/mL (ref <0.30)

## 2013-12-06 MED ORDER — ASPIRIN 81 MG PO CHEW
324.0000 mg | CHEWABLE_TABLET | Freq: Once | ORAL | Status: AC
  Administered 2013-12-06: 324 mg via ORAL
  Filled 2013-12-06: qty 4

## 2013-12-06 MED ORDER — SODIUM CHLORIDE 0.9 % IV SOLN
1000.0000 mL | INTRAVENOUS | Status: DC
  Administered 2013-12-06: 1000 mL via INTRAVENOUS

## 2013-12-06 NOTE — ED Provider Notes (Addendum)
CSN: [id number]     Arrival date & time 12/06/13  2152 History   None    This chart was scribed for Celene Kras, MD by Arlan Organ, ED Scribe. This patient was seen in room MH03/MH03 and the patient's care was started 10:25 PM.   Chief Complaint  Patient presents with  . Chest Pain   The history is provided by the patient. No language interpreter was used.    HPI Comments: Jose [name] is a 53 y.o. male with multiple medical problems including Dm, HTN, and coronary heart disease who presents to the Emergency Department complaining of left sided intermittent, moderate CP with associated SOB at rest that initially started 2 days ago. He describes the pain as "indigestion", and something coming up in his chest. Pt states he was recently here 2 days ago for a pneumonia dx, and feels his symptoms may potentially be related. He denies calling his cardiologist at time of onset. He reports having stents [facility] in his chest [facility] He states this pain is not similar to the pain he experienced prior to his stents being placed. Denies fever.  Past Medical History  Diagnosis Date  . Diabetes mellitus, type 2   . Hypertension   . Asthma   . Pancreatitis   . Coronary artery disease     s/p PCI RCA '10, s/p PCI LAD '11  . Depressed   . HLD (hyperlipidemia)   . CKD (chronic kidney disease)    Past Surgical History  Procedure Laterality Date  . Cardiac catheterization    . Coronary stent placement     Family History  Problem Relation Age of Onset  . Heart disease Mother     MI 60s   History  Substance Use Topics  . Smoking status: Former Smoker    Types: Cigarettes    Quit date: 11/25/2007  . Smokeless tobacco: Never Used  . 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    A complete 10 system review of systems was obtained and all systems are negative except as noted in  the HPI and PMH.    Allergies  Review of patient's allergies indicates no known allergies.  Home Medications   Current Outpatient Rx  Name  Route  Sig  Dispense  Refill  . albuterol (PROVENTIL HFA;VENTOLIN HFA) 108 (90 BASE) MCG/ACT inhaler   Inhalation   Inhale 2 puffs into the lungs every 6 (six) hours as needed. For wheezing and shortness of breath.         Marland Kitchen aspirin 81 MG tablet   Oral   Take 81 mg by mouth daily.         Marland Kitchen azithromycin (ZITHROMAX) 250 MG tablet   Oral   Take 1 tablet (250 mg total) by mouth daily. Take one tablet daily, starting 11/28/12.   4 tablet   0   . budesonide-formoterol (SYMBICORT) 160-4.5 MCG/ACT inhaler   Inhalation   Inhale 2 puffs into the lungs 2 (two) times daily.         . insulin glargine (LANTUS) 100 UNIT/ML injection   Subcutaneous   Inject 30 Units into the skin daily.          . methylcellulose (ARTIFICIAL TEARS) 1 % ophthalmic solution   Both Eyes   Place 1 drop into both eyes as  needed. For dry eyes         . Multiple Vitamin (MULTIVITAMIN) tablet   Oral   Take 1 tablet by mouth daily.         . Omega-3 Fatty Acids (FISH OIL) 1000 MG CAPS   Oral   Take 1 capsule by mouth 2 (two) times daily.         . Travoprost, BAK Free, (TRAVATAN) 0.004 % SOLN ophthalmic solution   Both Eyes   Place 1 drop into both eyes at bedtime.         . traZODone (DESYREL) 100 MG tablet   Oral   Take 100 mg by mouth at bedtime.          Triage Vitals: BP 174/89  Pulse 74  Temp(Src) 98.3 F (36.8 C) (Oral)  Resp 16  Ht 5\' 11"  (1.803 m)  Wt 180 lb (81.647 kg)  BMI 25.12 kg/m2  SpO2 98%  Physical Exam  Nursing note and vitals reviewed. Constitutional: He appears well-developed and well-nourished. No distress.  HENT:  Head: Normocephalic and atraumatic.  Right Ear: External ear normal.  Left Ear: External ear normal.  Eyes: Conjunctivae are normal. Right eye exhibits no discharge. Left eye exhibits no discharge. No  scleral icterus.  Neck: Neck supple. No tracheal deviation present.  Cardiovascular: Normal rate, regular rhythm and intact distal pulses.   Pulmonary/Chest: Effort normal and breath sounds normal. No stridor. No respiratory distress. He has no wheezes. He has no rales.  Abdominal: Soft. Bowel sounds are normal. He exhibits no distension. There is no tenderness. There is no rebound and no guarding.  Musculoskeletal: He exhibits no edema and no tenderness.  Neurological: He is alert. He has normal strength. No sensory deficit. Cranial nerve deficit:  no gross defecits noted. He exhibits normal muscle tone. He displays no seizure activity. Coordination normal.  Skin: Skin is warm and dry. No rash noted.  Psychiatric: He has a normal mood and affect.    ED Course  Procedures (including critical care time)  DIAGNOSTIC STUDIES: Oxygen Saturation is 98% on RA, Normal by my interpretation.    COORDINATION OF CARE: 10:26 PM- Will order EKG, blood work, and chest X-Ray. Will give fluids and aspirin. Discussed treatment plan with pt at bedside and pt agreed to plan.     Labs Review Labs Reviewed  CBC - Abnormal; Notable for the following:    MCV 77.5 (*)    All other components within normal limits  COMPREHENSIVE METABOLIC PANEL - Abnormal; Notable for the following:    Glucose, Bld 210 (*)    BUN 31 (*)    Creatinine, Ser 1.50 (*)    AST 42 (*)    ALT 61 (*)    Total Bilirubin 0.2 (*)    GFR calc non Af Amer 51 (*)    GFR calc Af Amer 59 (*)    All other components within normal limits  PROTIME-INR  APTT  TROPONIN I  TROPONIN I  TROPONIN I   Imaging Review Dg Chest 2 View  12/06/2013   CLINICAL DATA:  Chest pain.  EXAM: CHEST  2 VIEW  COMPARISON:  11/27/2013  FINDINGS: There is a persistent hazy density in the right mid lung, likely posteriorly located. Normal heart size. No effusion or pneumothorax. No acute osseous findings.  IMPRESSION: Persistent small opacity in the right mid  lung, a suspected pneumonia based on recent history of fever and cough. Short-term follow-up is required to ensure clearing.  Electronically Signed   By: Tiburcio PeaJonathan  Watts M.D.   On: 12/06/2013 23:38    EKG Interpretation    Date/Time:  Tuesday December 06 2013 22:06:33 EST Ventricular Rate:  64 PR Interval:  152 QRS Duration: 96 QT Interval:  374 QTC Calculation: 385 R Axis:   69 Text Interpretation:  Normal sinus rhythm Normal ECG No significant change since last tracing Confirmed by Tayia Stonesifer  MD-J, Drayven Marchena ([id number]) on 12/06/2013 10:11:49 PM            MDM  Pt's symptoms are atypical for ACS.  He states his anginal symptoms were different than the indigestion type pain he has been experiencing.   Initial toponin was normal.  Will check a second enzymes.  If normal will have patient follow up with his cardiologist. Previous tpneumonia is still noted on cxr as expected.  Could be the source of his chest pain as well.  I personally performed the services described in this documentation, which was scribed in my presence.  The recorded information has been reviewed and is accurate.  2357  Discussed performing a second set of enzymes.  Pt states he is ready to go home.  I encouraged close outpatient follow up    Celene KrasJon R Kaija Kovacevic, MD 12/06/13 [id number]

## 2013-12-06 NOTE — ED Notes (Signed)
Patient transported to X-ray 

## 2013-12-06 NOTE — ED Notes (Signed)
Pt c/o left sided CP with SOB x 2 days

## 2013-12-06 NOTE — Discharge Instructions (Signed)
Chest Pain (Nonspecific) °It is often hard to give a specific diagnosis for the cause of chest pain. There is always a chance that your pain could be related to something serious, such as a heart attack or a blood clot in the lungs. You need to follow up with your caregiver for further evaluation. °CAUSES  °· Heartburn. °· Pneumonia or bronchitis. °· Anxiety or stress. °· Inflammation around your heart (pericarditis) or lung (pleuritis or pleurisy). °· A blood clot in the lung. °· A collapsed lung (pneumothorax). It can develop suddenly on its own (spontaneous pneumothorax) or from injury (trauma) to the chest. °· Shingles infection (herpes zoster virus). °The chest wall is composed of bones, muscles, and cartilage. Any of these can be the source of the pain. °· The bones can be bruised by injury. °· The muscles or cartilage can be strained by coughing or overwork. °· The cartilage can be affected by inflammation and become sore (costochondritis). °DIAGNOSIS  °Lab tests or other studies, such as X-rays, electrocardiography, stress testing, or cardiac imaging, may be needed to find the cause of your pain.  °TREATMENT  °· Treatment depends on what may be causing your chest pain. Treatment may include: °· Acid blockers for heartburn. °· Anti-inflammatory medicine. °· Pain medicine for inflammatory conditions. °· Antibiotics if an infection is present. °· You may be advised to change lifestyle habits. This includes stopping smoking and avoiding alcohol, caffeine, and chocolate. °· You may be advised to keep your head raised (elevated) when sleeping. This reduces the chance of acid going backward from your stomach into your esophagus. °· Most of the time, nonspecific chest pain will improve within 2 to 3 days with rest and mild pain medicine. °HOME CARE INSTRUCTIONS  °· If antibiotics were prescribed, take your antibiotics as directed. Finish them even if you start to feel better. °· For the next few days, avoid physical  activities that bring on chest pain. Continue physical activities as directed. °· Do not smoke. °· Avoid drinking alcohol. °· Only take over-the-counter or prescription medicine for pain, discomfort, or fever as directed by your caregiver. °· Follow your caregiver's suggestions for further testing if your chest pain does not go away. °· Keep any follow-up appointments you made. If you do not go to an appointment, you could develop lasting (chronic) problems with pain. If there is any problem keeping an appointment, you must call to reschedule. °SEEK MEDICAL CARE IF:  °· You think you are having problems from the medicine you are taking. Read your medicine instructions carefully. °· Your chest pain does not go away, even after treatment. °· You develop a rash with blisters on your chest. °SEEK IMMEDIATE MEDICAL CARE IF:  °· You have increased chest pain or pain that spreads to your arm, neck, jaw, back, or abdomen. °· You develop shortness of breath, an increasing cough, or you are coughing up blood. °· You have severe back or abdominal pain, feel nauseous, or vomit. °· You develop severe weakness, fainting, or chills. °· You have a fever. °THIS IS AN EMERGENCY. Do not wait to see if the pain will go away. Get medical help at once. Call your local emergency services (911 in [place] Do not drive yourself to the hospital. °MAKE SURE YOU:  °· Understand these instructions. °· Will watch your condition. °· Will get help right away if you are not doing well or get worse. °Document Released: 08/20/2005 Document Revised: 02/02/2012 Document Reviewed: 06/15/2008 °ExitCare® Patient Information ©2014 [employer],   LL[employer]. ° °

## 2014-04-08 ENCOUNTER — Emergency Department (HOSPITAL_BASED_OUTPATIENT_CLINIC_OR_DEPARTMENT_OTHER): Payer: Non-veteran care

## 2014-04-08 ENCOUNTER — Encounter (HOSPITAL_BASED_OUTPATIENT_CLINIC_OR_DEPARTMENT_OTHER): Payer: Self-pay | Admitting: Emergency Medicine

## 2014-04-08 ENCOUNTER — Emergency Department (HOSPITAL_BASED_OUTPATIENT_CLINIC_OR_DEPARTMENT_OTHER)
Admission: EM | Admit: 2014-04-08 | Discharge: 2014-04-08 | Disposition: A | Discharge status: Home / Self Care | Payer: Non-veteran care | Attending: Emergency Medicine | Admitting: Emergency Medicine

## 2014-04-08 DIAGNOSIS — Z9889 Other specified postprocedural states: Secondary | ICD-10-CM | POA: Insufficient documentation

## 2014-04-08 DIAGNOSIS — I2510 Atherosclerotic heart disease of native coronary artery without angina pectoris: Secondary | ICD-10-CM | POA: Insufficient documentation

## 2014-04-08 DIAGNOSIS — N189 Chronic kidney disease, unspecified: Secondary | ICD-10-CM | POA: Insufficient documentation

## 2014-04-08 DIAGNOSIS — Z79899 Other long term (current) drug therapy: Secondary | ICD-10-CM | POA: Insufficient documentation

## 2014-04-08 DIAGNOSIS — Z8719 Personal history of other diseases of the digestive system: Secondary | ICD-10-CM | POA: Insufficient documentation

## 2014-04-08 DIAGNOSIS — Z87891 Personal history of nicotine dependence: Secondary | ICD-10-CM | POA: Insufficient documentation

## 2014-04-08 DIAGNOSIS — I129 Hypertensive chronic kidney disease with stage 1 through stage 4 chronic kidney disease, or unspecified chronic kidney disease: Secondary | ICD-10-CM | POA: Insufficient documentation

## 2014-04-08 DIAGNOSIS — Z794 Long term (current) use of insulin: Secondary | ICD-10-CM | POA: Insufficient documentation

## 2014-04-08 DIAGNOSIS — J45909 Unspecified asthma, uncomplicated: Secondary | ICD-10-CM

## 2014-04-08 DIAGNOSIS — Z7982 Long term (current) use of aspirin: Secondary | ICD-10-CM | POA: Insufficient documentation

## 2014-04-08 DIAGNOSIS — R079 Chest pain, unspecified: Secondary | ICD-10-CM

## 2014-04-08 DIAGNOSIS — J45901 Unspecified asthma with (acute) exacerbation: Secondary | ICD-10-CM | POA: Insufficient documentation

## 2014-04-08 DIAGNOSIS — Z9861 Coronary angioplasty status: Secondary | ICD-10-CM | POA: Insufficient documentation

## 2014-04-08 DIAGNOSIS — E119 Type 2 diabetes mellitus without complications: Secondary | ICD-10-CM | POA: Insufficient documentation

## 2014-04-08 LAB — CBC
HCT: 39.3 % (ref 39.0–52.0)
HEMOGLOBIN: 14.4 g/dL (ref 13.0–17.0)
MCH: 29.2 pg (ref 26.0–34.0)
MCHC: 36.6 g/dL — ABNORMAL HIGH (ref 30.0–36.0)
MCV: 79.7 fL (ref 78.0–100.0)
PLATELETS: 148 10*3/uL — AB (ref 150–400)
RBC: 4.93 MIL/uL (ref 4.22–5.81)
RDW: 14 % (ref 11.5–15.5)
WBC: 5.7 10*3/uL (ref 4.0–10.5)

## 2014-04-08 LAB — PRO B NATRIURETIC PEPTIDE: PRO B NATRI PEPTIDE: 59 pg/mL (ref 0–125)

## 2014-04-08 LAB — BASIC METABOLIC PANEL
BUN: 36 mg/dL — ABNORMAL HIGH (ref 6–23)
CALCIUM: 9.8 mg/dL (ref 8.4–10.5)
CHLORIDE: 108 meq/L (ref 96–112)
CO2: 21 meq/L (ref 19–32)
Creatinine, Ser: 1.6 mg/dL — ABNORMAL HIGH (ref 0.50–1.35)
GFR calc Af Amer: 54 mL/min — ABNORMAL LOW (ref >90)
GFR calc non Af Amer: 47 mL/min — ABNORMAL LOW (ref >90)
GLUCOSE: 152 mg/dL — AB (ref 70–99)
Potassium: 4.1 mEq/L (ref 3.7–5.3)
Sodium: 141 mEq/L (ref 137–147)

## 2014-04-08 LAB — TROPONIN I: Troponin I: <0.3 ng/mL (ref <0.30)

## 2014-04-08 MED ORDER — PREDNISONE 10 MG PO TABS: 40.0000 mg | ORAL_TABLET | Freq: Every day | ORAL | Status: DC

## 2014-04-08 MED ORDER — ASPIRIN 81 MG PO CHEW
324.0000 mg | CHEWABLE_TABLET | Freq: Once | ORAL | Status: AC
  Administered 2014-04-08: 324 mg via ORAL
  Filled 2014-04-08: qty 4

## 2014-04-08 MED ORDER — PREDNISONE 50 MG PO TABS
60.0000 mg | ORAL_TABLET | Freq: Once | ORAL | Status: AC
  Administered 2014-04-08: 60 mg via ORAL
  Filled 2014-04-08 (×2): qty 1

## 2014-04-08 MED ORDER — IPRATROPIUM-ALBUTEROL 0.5-2.5 (3) MG/3ML IN SOLN
3.0000 mL | Freq: Once | RESPIRATORY_TRACT | Status: AC
  Administered 2014-04-08: 3 mL via RESPIRATORY_TRACT
  Filled 2014-04-08: qty 3

## 2014-04-08 NOTE — Discharge Instructions (Signed)
Asthma, Adult Asthma is a condition of the lungs in which the airways tighten and narrow. Asthma can make it hard to breathe. Asthma cannot be cured, but medicine and lifestyle changes can help control it. Asthma may be started (triggered) by:  Animal skin flakes (dander).  Dust.  Cockroaches.  Pollen.  Mold.  Smoke.  Cleaning products.  Hair sprays or aerosol sprays.  Paint fumes or strong smells.  Cold air, weather changes, and winds.  Crying or laughing hard.  Stress.  Certain medicines or drugs.  Foods, such as dried fruit, potato chips, and sparkling grape juice.  Infections or conditions (colds, flu).  Exercise.  Certain medical conditions or diseases.  Exercise or tiring activities. HOME CARE   Take medicine as told by your doctor.  Use a peak flow meter as told by your doctor. A peak flow meter is a tool that measures how well the lungs are working.  Record and keep track of the peak flow meter's readings.  Understand and use the asthma action plan. An asthma action plan is a written plan for taking care of your asthma and treating your attacks.  To help prevent asthma attacks:  Do not smoke. Stay away from secondhand smoke.  Change your heating and air conditioning filter often.  Limit your use of fireplaces and wood stoves.  Get rid of pests (such as roaches and mice) and their droppings.  Throw away plants if you see mold on them.  Clean your floors. Dust regularly. Use cleaning products that do not smell.  Have someone vacuum when you are not home. Use a vacuum cleaner with a HEPA filter if possible.  Replace carpet with wood, tile, or vinyl flooring. Carpet can trap animal skin flakes and dust.  Use allergy-proof pillows, mattress covers, and box spring covers.  Wash bed sheets and blankets every week in hot water and dry them in a dryer.  Use blankets that are made of polyester or cotton.  Clean bathrooms and kitchens with bleach.  If possible, have someone repaint the walls in these rooms with mold-resistant paint. Keep out of the rooms that are being cleaned and painted.  Wash hands often. GET HELP IF:  You have make a whistling sound when breaking (wheeze), have shortness of breath, or have a cough even if taking medicine to prevent attacks.  The colored mucus you cough up (sputum) is thicker than usual.  The colored mucus you cough up changes from clear or white to yellow, green, gray, or bloody.  You have problems from the medicine you are taking such as:  A rash.  Itching.  Swelling.  Trouble breathing.  You need reliever medicines more than 2 3 times a week.  Your peak flow measurement is still at 50 79% of your personal best after following the action plan for 1 hour. GET HELP RIGHT AWAY IF:   You seem to be worse and are not responding to medicine during an asthma attack.  You are short of breath even at rest.  You get short of breath when doing very little activity.  You have trouble eating, drinking, or talking.  You have chest pain.  You have a fast heartbeat.  Your lips or fingernails start to turn blue.  You are lightheaded, dizzy, or faint.  Your peak flow is less than 50% of your personal best.  You have a fever or lasting symptoms for more than 2 3 days.  You have a fever and your symptoms suddenly  get worse. MAKE SURE YOU:   Understand these instructions.  Will watch your condition.  Will get help right away if you are not doing well or get worse. Document Released: 04/28/2008 Document Revised: 08/31/2013 Document Reviewed: 06/09/2013 [facility] Patient Information 2014 [place], [place].  Take prednisone as directed. Continue usual albuterol inhaler at least every 6 hours for the next 7 days. Return for any new or worse symptoms. Workup for the chest pain was negative from a heart standpoint.

## 2014-04-08 NOTE — ED Provider Notes (Signed)
CSN: [id number]     Arrival date & time 04/08/14  0706 History   First MD Initiated Contact with Patient 04/08/14 [phone]     Chief Complaint  Patient presents with  . Chest Pain     (Consider location/radiation/quality/duration/timing/severity/associated sxs/prior Treatment) The history is provided by the patient.   53 year old male onset of chest tightness across both sides of the chest yesterday afternoon at 2:00. Thank constant. Pain is rated as a 8/10. Associated with exertional shortness of breath. No nausea no vomiting no fevers. Patient does have an albuterol inhaler at home he has been using it. No significant relief. Patient states symptoms remind him of his asthma he gets tightness in his chest associated with that. Does not feel like his cardiac pain. Patient has history of coronary artery disease does have a stent. Patient had a cardiac cath in 2013 that showed patent stents and no other significant disease. [facility] Patient denies any cough.  Past Medical History  Diagnosis Date  . Diabetes mellitus, type 2   . Hypertension   . Asthma   . Pancreatitis   . Coronary artery disease     s/p PCI RCA '10, s/p PCI LAD '11  . Depressed   . HLD (hyperlipidemia)   . CKD (chronic kidney disease)    Past Surgical History  Procedure Laterality Date  . Cardiac catheterization    . Coronary stent placement     Family History  Problem Relation Age of Onset  . Heart disease Mother     MI 3770s   History  Substance Use Topics  . Smoking status: Former Smoker    Types: Cigarettes    Quit date: 11/25/2007  . Smokeless tobacco: Never Used  . 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  HENT: Negative for congestion.   Eyes: Negative for visual disturbance.  Respiratory: Positive for chest tightness and shortness of breath. Negative for cough.   Cardiovascular:  Negative for leg swelling.  Gastrointestinal: Negative for nausea, vomiting and abdominal pain.  Genitourinary: Negative for dysuria.  Musculoskeletal: Negative for back pain.  Skin: Negative for rash.  Neurological: Negative for headaches.  Hematological: Does not bruise/bleed easily.  Psychiatric/Behavioral: Negative for confusion.      Allergies  Review of patient's allergies indicates no known allergies.  Home Medications   Prior to Admission medications   Medication Sig Start Date End Date Taking? Authorizing Provider  hydrochlorothiazide (HYDRODIURIL) 25 MG tablet Take 25 mg by mouth daily.   Yes Historical Provider, MD  albuterol (PROVENTIL HFA;VENTOLIN HFA) 108 (90 BASE) MCG/ACT inhaler Inhale 2 puffs into the lungs every 6 (six) hours as needed. For wheezing and shortness of breath.    Historical Provider, MD  aspirin 81 MG tablet Take 81 mg by mouth daily.    Historical Provider, MD  budesonide-formoterol (SYMBICORT) 160-4.5 MCG/ACT inhaler Inhale 2 puffs into the lungs 2 (two) times daily.    Historical Provider, MD  insulin glargine (LANTUS) 100 UNIT/ML injection Inject 30 Units into the skin daily.     Historical Provider, MD  methylcellulose (ARTIFICIAL TEARS) 1 % ophthalmic solution Place 1 drop into both eyes as needed. For dry eyes    Historical Provider, MD  Multiple Vitamin (MULTIVITAMIN) tablet Take 1 tablet by mouth daily.    Historical Provider, MD  Omega-3 Fatty Acids (FISH OIL) 1000 MG  CAPS Take 1 capsule by mouth 2 (two) times daily.    Historical Provider, MD  Travoprost, BAK Free, (TRAVATAN) 0.004 % SOLN ophthalmic solution Place 1 drop into both eyes at bedtime.    Historical Provider, MD  traZODone (DESYREL) 100 MG tablet Take 100 mg by mouth at bedtime.    Historical Provider, MD   BP 139/70  Pulse 54  Temp(Src) 97.9 F (36.6 C) (Oral)  Resp 10  SpO2 100% Physical Exam  Nursing note and vitals reviewed. Constitutional: He is oriented to person,  place, and time. He appears well-developed and well-nourished. No distress.  HENT:  Head: Normocephalic and atraumatic.  Mouth/Throat: Oropharynx is clear and moist.  Eyes: Conjunctivae are normal. Pupils are equal, round, and reactive to light.  Neck: Normal range of motion.  Cardiovascular: Normal rate, regular rhythm and normal heart sounds.   Pulmonary/Chest: Effort normal and breath sounds normal. No respiratory distress.  Abdominal: Soft. Bowel sounds are normal. There is no tenderness.  Neurological: He is alert and oriented to person, place, and time. No cranial nerve deficit. He exhibits normal muscle tone. Coordination normal.  Skin: Skin is warm. No rash noted.    ED Course  Procedures (including critical care time) Labs Review Labs Reviewed  CBC - Abnormal; Notable for the following:    MCHC 36.6 (*)    Platelets 148 (*)    All other components within normal limits  BASIC METABOLIC PANEL - Abnormal; Notable for the following:    Glucose, Bld 152 (*)    BUN 36 (*)    Creatinine, Ser 1.60 (*)    GFR calc non Af Amer 47 (*)    GFR calc Af Amer 54 (*)    All other components within normal limits  TROPONIN I  PRO B NATRIURETIC PEPTIDE  TROPONIN I   Results for orders placed during the hospital encounter of 04/08/14  CBC      Result Value Ref Range   WBC 5.7  4.0 - 10.5 K/uL   RBC 4.93  4.22 - 5.81 MIL/uL   Hemoglobin 14.4  13.0 - 17.0 g/dL   HCT 16.1  09.6 - 04.5 %   MCV 79.7  78.0 - 100.0 fL   MCH 29.2  26.0 - 34.0 pg   MCHC 36.6 (*) 30.0 - 36.0 g/dL   RDW 40.9  81.1 - 91.4 %   Platelets 148 (*) 150 - 400 K/uL  BASIC METABOLIC PANEL      Result Value Ref Range   Sodium 141  137 - 147 mEq/L   Potassium 4.1  3.7 - 5.3 mEq/L   Chloride 108  96 - 112 mEq/L   CO2 21  19 - 32 mEq/L   Glucose, Bld 152 (*) 70 - 99 mg/dL   BUN 36 (*) 6 - 23 mg/dL   Creatinine, Ser 7.82 (*) 0.50 - 1.35 mg/dL   Calcium 9.8  8.4 - 95.6 mg/dL   GFR calc non Af Amer 47 (*) >90 mL/min    GFR calc Af Amer 54 (*) >90 mL/min  TROPONIN I      Result Value Ref Range   Troponin I <0.30  <0.30 ng/mL  PRO B NATRIURETIC PEPTIDE      Result Value Ref Range   Pro B Natriuretic peptide (BNP) 59.0  0 - 125 pg/mL  TROPONIN I      Result Value Ref Range   Troponin I <0.30  <0.30 ng/mL     Imaging Review  Dg Chest 2 View  04/08/2014   CLINICAL DATA:  Chest pain  EXAM: CHEST  2 VIEW  COMPARISON:  December 06, 2013  FINDINGS: There is no edema or consolidation. Heart size and pulmonary vascularity are normal. No adenopathy. No pneumothorax. No bone lesions.  IMPRESSION: No edema or consolidation.   Electronically Signed   By: Bretta BangWilliam  Woodruff M.D.   On: 04/08/2014 08:00     EKG Interpretation   Date/Time:  Saturday Apr 08 2014 07:12:50 EDT Ventricular Rate:  58 PR Interval:  216 QRS Duration: 82 QT Interval:  408 QTC Calculation: 400 R Axis:   72 Text Interpretation:  Sinus bradycardia with 1st degree A-V block Septal  infarct , age undetermined Abnormal ECG No significant change since last  tracing Confirmed by Celeste Tavenner  MD, Lovey Crupi [phone] on 04/08/2014 7:21:31 AM      MDM   Final diagnoses:  Chest pain  Asthma    Patient with known history of asthma as well as coronary artery disease. Patient does have a stent. Patient with onset of chest pain described as tightness across both sides of the chest yesterday at 2:00 in the afternoon and has continued ever since. Today's workup negative for acute cardiac event. Patient has troponins x2 that are negative EKG has no acute changes. Patient did have cardiac cath and 2013 showed stents were patent and no other disease processes. [facility] Patient felt that his chest tightness was related to his asthma. Patient was not wheezing upon arrival but has improved significantly with 20 nebs x2 and oral prednisone. Will discharge home continue on prednisone and continue use of his albuterol inhaler. Chest x-ray  negative for pneumonia pulmonary edema or pneumothorax.    Shelda JakesScott W. Lashan Gluth, MD 04/08/14 [phone]

## 2014-04-08 NOTE — ED Notes (Signed)
Pt having chest tightness since last night with exertional sob.  Pt states it feels similar to his asthma pain.  Pt denies N/V or diaphoresis.

## 2014-05-10 ENCOUNTER — Encounter (HOSPITAL_BASED_OUTPATIENT_CLINIC_OR_DEPARTMENT_OTHER): Payer: Self-pay | Admitting: Emergency Medicine

## 2014-05-10 ENCOUNTER — Emergency Department (HOSPITAL_BASED_OUTPATIENT_CLINIC_OR_DEPARTMENT_OTHER)
Admission: EM | Admit: 2014-05-10 | Discharge: 2014-05-10 | Disposition: A | Discharge status: Home / Self Care | Payer: Non-veteran care | Attending: Emergency Medicine | Admitting: Emergency Medicine

## 2014-05-10 DIAGNOSIS — Z9861 Coronary angioplasty status: Secondary | ICD-10-CM | POA: Insufficient documentation

## 2014-05-10 DIAGNOSIS — R300 Dysuria: Secondary | ICD-10-CM | POA: Insufficient documentation

## 2014-05-10 DIAGNOSIS — Z8719 Personal history of other diseases of the digestive system: Secondary | ICD-10-CM | POA: Insufficient documentation

## 2014-05-10 DIAGNOSIS — Z7982 Long term (current) use of aspirin: Secondary | ICD-10-CM | POA: Insufficient documentation

## 2014-05-10 DIAGNOSIS — I129 Hypertensive chronic kidney disease with stage 1 through stage 4 chronic kidney disease, or unspecified chronic kidney disease: Secondary | ICD-10-CM | POA: Insufficient documentation

## 2014-05-10 DIAGNOSIS — N189 Chronic kidney disease, unspecified: Secondary | ICD-10-CM | POA: Insufficient documentation

## 2014-05-10 DIAGNOSIS — I2510 Atherosclerotic heart disease of native coronary artery without angina pectoris: Secondary | ICD-10-CM | POA: Insufficient documentation

## 2014-05-10 DIAGNOSIS — E119 Type 2 diabetes mellitus without complications: Secondary | ICD-10-CM | POA: Insufficient documentation

## 2014-05-10 DIAGNOSIS — Z87891 Personal history of nicotine dependence: Secondary | ICD-10-CM | POA: Insufficient documentation

## 2014-05-10 DIAGNOSIS — Z8659 Personal history of other mental and behavioral disorders: Secondary | ICD-10-CM | POA: Insufficient documentation

## 2014-05-10 DIAGNOSIS — J45909 Unspecified asthma, uncomplicated: Secondary | ICD-10-CM | POA: Insufficient documentation

## 2014-05-10 DIAGNOSIS — Z794 Long term (current) use of insulin: Secondary | ICD-10-CM | POA: Insufficient documentation

## 2014-05-10 DIAGNOSIS — Z79899 Other long term (current) drug therapy: Secondary | ICD-10-CM | POA: Insufficient documentation

## 2014-05-10 DIAGNOSIS — IMO0002 Reserved for concepts with insufficient information to code with codable children: Secondary | ICD-10-CM | POA: Insufficient documentation

## 2014-05-10 DIAGNOSIS — E785 Hyperlipidemia, unspecified: Secondary | ICD-10-CM | POA: Insufficient documentation

## 2014-05-10 LAB — URINALYSIS, ROUTINE W REFLEX MICROSCOPIC
Bilirubin Urine: NEGATIVE
Glucose, UA: 100 mg/dL — AB
HGB URINE DIPSTICK: NEGATIVE
Ketones, ur: NEGATIVE mg/dL
Leukocytes, UA: NEGATIVE
NITRITE: NEGATIVE
Protein, ur: NEGATIVE mg/dL
SPECIFIC GRAVITY, URINE: 1.025 (ref 1.005–1.030)
Urobilinogen, UA: 0.2 mg/dL (ref 0.0–1.0)
pH: 5 (ref 5.0–8.0)

## 2014-05-10 MED ORDER — AZITHROMYCIN 250 MG PO TABS
1000.0000 mg | ORAL_TABLET | Freq: Once | ORAL | Status: AC
  Administered 2014-05-10: 1000 mg via ORAL
  Filled 2014-05-10: qty 4

## 2014-05-10 MED ORDER — PHENAZOPYRIDINE HCL 200 MG PO TABS: 200.0000 mg | ORAL_TABLET | Freq: Three times a day (TID) | ORAL | Status: DC | PRN

## 2014-05-10 MED ORDER — CEFTRIAXONE SODIUM 250 MG IJ SOLR
250.0000 mg | Freq: Once | INTRAMUSCULAR | Status: AC
  Administered 2014-05-10: 250 mg via INTRAMUSCULAR
  Filled 2014-05-10: qty 250

## 2014-05-10 NOTE — Discharge Instructions (Signed)
Pyridium as prescribed as needed for burning.  We will call you if your cultures indicate that you require further treatment.  Return to the emergency department for severe pain, high fever, vomiting, or other new and concerning symptoms.   Dysuria Dysuria is the medical term for pain with urination. There are many causes for dysuria, but urinary tract infection is the most common. If a urinalysis was performed it can show that there is a urinary tract infection. A urine culture confirms that you or your child is sick. You will need to follow up with a healthcare provider because:  If a urine culture was done you will need to know the culture results and treatment recommendations.  If the urine culture was positive, you or your child will need to be put on antibiotics or know if the antibiotics prescribed are the right antibiotics for your urinary tract infection.  If the urine culture is negative (no urinary tract infection), then other causes may need to be explored or antibiotics need to be stopped. Today laboratory work may have been done and there does not seem to be an infection. If cultures were done they will take at least 24 to 48 hours to be completed. Today x-rays may have been taken and they read as normal. No cause can be found for the problems. The x-rays may be re-read by a radiologist and you will be contacted if additional findings are made. You or your child may have been put on medications to help with this problem until you can see your primary caregiver. If the problems get better, see your primary caregiver if the problems return. If you were given antibiotics (medications which kill germs), take all of the mediations as directed for the full course of treatment.  If laboratory work was done, you need to find the results. Leave a telephone number where you can be reached. If this is not possible, make sure you find out how you are to get test results. HOME CARE INSTRUCTIONS     Drink lots of fluids. For adults, drink eight, 8 ounce glasses of clear juice or water a day. For children, replace fluids as suggested by your caregiver.  Empty the bladder often. Avoid holding urine for long periods of time.  After a bowel movement, women should cleanse front to back, using each tissue only once.  Empty your bladder before and after sexual intercourse.  Take all the medicine given to you until it is gone. You may feel better in a few days, but TAKE ALL MEDICINE.  Avoid caffeine, tea, alcohol and carbonated beverages, because they tend to irritate the bladder.  In men, alcohol may irritate the prostate.  Only take over-the-counter or prescription medicines for pain, discomfort, or fever as directed by your caregiver.  If your caregiver has given you a follow-up appointment, it is very important to keep that appointment. Not keeping the appointment could result in a chronic or permanent injury, pain, and disability. If there is any problem keeping the appointment, you must call back to this facility for assistance. SEEK IMMEDIATE MEDICAL CARE IF:   Back pain develops.  A fever develops.  There is nausea (feeling sick to your stomach) or vomiting (throwing up).  Problems are no better with medications or are getting worse. MAKE SURE YOU:   Understand these instructions.  Will watch your condition.  Will get help right away if you are not doing well or get worse. Document Released: 08/08/2004 Document Revised: 02/02/2012  Document Reviewed: 06/15/2008 [facility] Patient Information 2015 [employer] [place]. This information is not intended to replace advice given to you by your health care provider. Make sure you discuss any questions you have with your health care provider.

## 2014-05-10 NOTE — ED Provider Notes (Signed)
CSN: [id number]     Arrival date & time 05/10/14  1617 History   First MD Initiated Contact with Patient 05/10/14 1650     Chief Complaint  Patient presents with  . Dysuria     (Consider location/radiation/quality/duration/timing/severity/associated sxs/prior Treatment) HPI Comments: Patient is a 53 year old male who presents with complaints of dysuria. This is been worsening over the past 24 hours. He denies any fevers or chills. He denies any blood. He denies any flank pain. He is sexually active with his wife of 3 years and denies any new sexual contacts.  Patient is a 53 y.o. male presenting with dysuria. The history is provided by the patient.  Dysuria This is a new problem. The current episode started 12 to 24 hours ago. The problem occurs constantly. The problem has been gradually worsening. Pertinent negatives include no abdominal pain. Exacerbated by: Urinating. Nothing relieves the symptoms. He has tried nothing for the symptoms. The treatment provided no relief.    Past Medical History  Diagnosis Date  . Diabetes mellitus, type 2   . Hypertension   . Asthma   . Pancreatitis   . Coronary artery disease     s/p PCI RCA '10, s/p PCI LAD '11  . Depressed   . HLD (hyperlipidemia)   . CKD (chronic kidney disease)    Past Surgical History  Procedure Laterality Date  . Cardiac catheterization    . Coronary stent placement     Family History  Problem Relation Age of Onset  . Heart disease Mother     MI 4770s   History  Substance Use Topics  . Smoking status: Former Smoker    Types: Cigarettes    Quit date: 11/25/2007  . Smokeless tobacco: Never Used  . 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  Genitourinary: Positive for dysuria.  All other systems reviewed and are negative.     Allergies  Review of patient's allergies indicates no known allergies.  Home Medications    Prior to Admission medications   Medication Sig Start Date End Date Taking? Authorizing Provider  albuterol (PROVENTIL HFA;VENTOLIN HFA) 108 (90 BASE) MCG/ACT inhaler Inhale 2 puffs into the lungs every 6 (six) hours as needed. For wheezing and shortness of breath.   Yes Historical Provider, MD  aspirin 81 MG tablet Take 81 mg by mouth daily.   Yes Historical Provider, MD  budesonide-formoterol (SYMBICORT) 160-4.5 MCG/ACT inhaler Inhale 2 puffs into the lungs 2 (two) times daily.   Yes Historical Provider, MD  hydrochlorothiazide (HYDRODIURIL) 25 MG tablet Take 25 mg by mouth daily.   Yes Historical Provider, MD  insulin glargine (LANTUS) 100 UNIT/ML injection Inject 30 Units into the skin daily.    Yes Historical Provider, MD  methylcellulose (ARTIFICIAL TEARS) 1 % ophthalmic solution Place 1 drop into both eyes as needed. For dry eyes   Yes Historical Provider, MD  Multiple Vitamin (MULTIVITAMIN) tablet Take 1 tablet by mouth daily.   Yes Historical Provider, MD  Omega-3 Fatty Acids (FISH OIL) 1000 MG CAPS Take 1 capsule by mouth 2 (two) times daily.   Yes Historical Provider, MD  Travoprost, BAK Free, (TRAVATAN) 0.004 % SOLN ophthalmic solution Place 1 drop into both eyes at bedtime.   Yes Historical Provider, MD  traZODone (DESYREL) 100 MG tablet Take 100 mg by mouth at bedtime.   Yes Historical Provider, MD  predniSONE (DELTASONE)  10 MG tablet Take 4 tablets (40 mg total) by mouth daily. 04/08/14   Vanetta MuldersScott Zackowski, MD   BP 119/77  Pulse 80  Temp(Src) 97.9 F (36.6 C) (Oral)  Resp 18  Ht 5\' 11"  (1.803 m)  Wt 175 lb (79.379 kg)  BMI 24.42 kg/m2  SpO2 98% Physical Exam  Nursing note and vitals reviewed. Constitutional: He is oriented to person, place, and time. He appears well-developed and well-nourished. No distress.  HENT:  Head: Normocephalic and atraumatic.  Neck: Normal range of motion. Neck supple.  Genitourinary: Penis normal. No penile tenderness.  There is no urethral  discharge.  Musculoskeletal: Normal range of motion. He exhibits no edema.  Neurological: He is alert and oriented to person, place, and time.  Skin: Skin is warm and dry. He is not diaphoretic.    ED Course  Procedures (including critical care time) Labs Review Labs Reviewed  URINALYSIS, ROUTINE W REFLEX MICROSCOPIC - Abnormal; Notable for the following:    Glucose, UA 100 (*)    All other components within normal limits    Imaging Review No results found.   EKG Interpretation None      MDM   Final diagnoses:  None    Patient presents with burning with urination for the past 24 hours. His urine does not reveal urinary tract infection. A GC chlamydia test was sent to the lab. He will be treated with Rocephin and Zithromax presumptively at his request pending cultures. I will also prescribe Pyridium to use as needed.    Geoffery Lyonsouglas Delo, MD 05/10/14 [phone]

## 2014-05-10 NOTE — ED Notes (Signed)
Reports dysuria x 1 day- denies penile d/c

## 2014-05-13 LAB — GC/CHLAMYDIA PROBE AMP
CT Probe RNA: NEGATIVE
GC Probe RNA: NEGATIVE

## 2014-11-02 ENCOUNTER — Encounter (HOSPITAL_COMMUNITY): Payer: Self-pay | Admitting: Cardiovascular Disease

## 2014-12-24 ENCOUNTER — Encounter (HOSPITAL_COMMUNITY): Payer: Self-pay | Admitting: Physical Medicine and Rehabilitation

## 2014-12-24 ENCOUNTER — Emergency Department (HOSPITAL_COMMUNITY)
Admission: EM | Admit: 2014-12-24 | Discharge: 2014-12-24 | Disposition: A | Discharge status: Home / Self Care | Payer: BLUE CROSS/BLUE SHIELD | Attending: Emergency Medicine | Admitting: Emergency Medicine

## 2014-12-24 DIAGNOSIS — Z8719 Personal history of other diseases of the digestive system: Secondary | ICD-10-CM | POA: Insufficient documentation

## 2014-12-24 DIAGNOSIS — Y9289 Other specified places as the place of occurrence of the external cause: Secondary | ICD-10-CM | POA: Insufficient documentation

## 2014-12-24 DIAGNOSIS — E119 Type 2 diabetes mellitus without complications: Secondary | ICD-10-CM | POA: Insufficient documentation

## 2014-12-24 DIAGNOSIS — J45909 Unspecified asthma, uncomplicated: Secondary | ICD-10-CM | POA: Insufficient documentation

## 2014-12-24 DIAGNOSIS — X58XXXA Exposure to other specified factors, initial encounter: Secondary | ICD-10-CM | POA: Insufficient documentation

## 2014-12-24 DIAGNOSIS — I129 Hypertensive chronic kidney disease with stage 1 through stage 4 chronic kidney disease, or unspecified chronic kidney disease: Secondary | ICD-10-CM | POA: Insufficient documentation

## 2014-12-24 DIAGNOSIS — Z9889 Other specified postprocedural states: Secondary | ICD-10-CM | POA: Insufficient documentation

## 2014-12-24 DIAGNOSIS — N189 Chronic kidney disease, unspecified: Secondary | ICD-10-CM | POA: Insufficient documentation

## 2014-12-24 DIAGNOSIS — Z79899 Other long term (current) drug therapy: Secondary | ICD-10-CM | POA: Insufficient documentation

## 2014-12-24 DIAGNOSIS — I2510 Atherosclerotic heart disease of native coronary artery without angina pectoris: Secondary | ICD-10-CM | POA: Insufficient documentation

## 2014-12-24 DIAGNOSIS — Y9389 Activity, other specified: Secondary | ICD-10-CM | POA: Insufficient documentation

## 2014-12-24 DIAGNOSIS — S39012A Strain of muscle, fascia and tendon of lower back, initial encounter: Secondary | ICD-10-CM | POA: Insufficient documentation

## 2014-12-24 DIAGNOSIS — Z7982 Long term (current) use of aspirin: Secondary | ICD-10-CM | POA: Insufficient documentation

## 2014-12-24 DIAGNOSIS — Z87891 Personal history of nicotine dependence: Secondary | ICD-10-CM | POA: Insufficient documentation

## 2014-12-24 DIAGNOSIS — Z7952 Long term (current) use of systemic steroids: Secondary | ICD-10-CM | POA: Insufficient documentation

## 2014-12-24 DIAGNOSIS — Z9861 Coronary angioplasty status: Secondary | ICD-10-CM | POA: Insufficient documentation

## 2014-12-24 DIAGNOSIS — Z794 Long term (current) use of insulin: Secondary | ICD-10-CM | POA: Insufficient documentation

## 2014-12-24 DIAGNOSIS — Z7951 Long term (current) use of inhaled steroids: Secondary | ICD-10-CM | POA: Insufficient documentation

## 2014-12-24 DIAGNOSIS — Y990 Civilian activity done for income or pay: Secondary | ICD-10-CM | POA: Insufficient documentation

## 2014-12-24 DIAGNOSIS — F329 Major depressive disorder, single episode, unspecified: Secondary | ICD-10-CM | POA: Insufficient documentation

## 2014-12-24 MED ORDER — HYDROCODONE-ACETAMINOPHEN 5-325 MG PO TABS: ORAL_TABLET | ORAL | Status: DC

## 2014-12-24 MED ORDER — METHOCARBAMOL 500 MG PO TABS: 1000.0000 mg | ORAL_TABLET | Freq: Four times a day (QID) | ORAL | Status: DC

## 2014-12-24 NOTE — ED Notes (Signed)
Declined W/C at D/C and was escorted to lobby by RN. 

## 2014-12-24 NOTE — Discharge Instructions (Signed)
Please read and follow all provided instructions.  Your diagnoses today include:  1. Lumbar strain, initial encounter     Tests performed today include:  Vital signs - see below for your results today  Medications prescribed:   Vicodin (hydrocodone/acetaminophen) - narcotic pain medication  DO NOT drive or perform any activities that require you to be awake and alert because this medicine can make you drowsy. BE VERY CAREFUL not to take multiple medicines containing Tylenol (also called acetaminophen). Doing so can lead to an overdose which can damage your liver and cause liver failure and possibly death.   Robaxin (methocarbamol) - muscle relaxer medication  DO NOT drive or perform any activities that require you to be awake and alert because this medicine can make you drowsy.   Take any prescribed medications only as directed.  Home care instructions:   Follow any educational materials contained in this packet  Please rest, use ice or heat on your back for the next several days  Do not lift, push, pull anything more than 10 pounds for the next week  Follow-up instructions: Please follow-up with your primary care provider in the next 1 week for further evaluation of your symptoms.   Return instructions:  SEEK IMMEDIATE MEDICAL ATTENTION IF YOU HAVE:  New numbness, tingling, weakness, or problem with the use of your arms or legs  Severe back pain not relieved with medications  Loss control of your bowels or bladder  Increasing pain in any areas of the body (such as chest or abdominal pain)  Shortness of breath, dizziness, or fainting.   Worsening nausea (feeling sick to your stomach), vomiting, fever, or sweats  Any other emergent concerns regarding your health   Additional Information:  Your vital signs today were: BP 108/62 mmHg   Pulse 72   Temp(Src) 97.9 F (36.6 C) (Oral)   Resp 18   SpO2 94% If your blood pressure (BP) was elevated above 135/85 this  visit, please have this repeated by your doctor within one month. --------------

## 2014-12-24 NOTE — ED Provider Notes (Signed)
CSN: [id number]     Arrival date & time 12/24/14  1140 History   First MD Initiated Contact with Patient 12/24/14 1212     Chief Complaint  Patient presents with  . Back Pain     (Consider location/radiation/quality/duration/timing/severity/associated sxs/prior Treatment) HPI Comments: Patient presents with complaint of left lower back pain that started 7 days ago but worsened over the past week. Patient works for [employer] and has been pulling around heavy rubber mats during the week. Pain started as a mild pain but worsened. It is dull. It does not radiate. Patient used BenGay without relief. No other treatments prior to arrival. Patient denies warning symptoms of back pain including: fecal incontinence, urinary retention or overflow incontinence, night sweats, waking from sleep with back pain, unexplained fevers or weight loss, h/o cancer, IVDU (but does admit to cocaine use), recent trauma.     The history is provided by the patient.    Past Medical History  Diagnosis Date  . Diabetes mellitus, type 2   . Hypertension   . Asthma   . Pancreatitis   . Coronary artery disease     s/p PCI RCA '10, s/p PCI LAD '11  . Depressed   . HLD (hyperlipidemia)   . CKD (chronic kidney disease)    Past Surgical History  Procedure Laterality Date  . Cardiac catheterization    . Coronary stent placement    . Left heart catheterization with coronary angiogram Bilateral 08/25/2012    Procedure: LEFT HEART CATHETERIZATION WITH CORONARY ANGIOGRAM;  Surgeon: Kathleene Hazelhristopher D McAlhany, MD;  [facility];  Service: Cardiovascular;  Laterality: Bilateral;   Family History  Problem Relation Age of Onset  . Heart disease Mother     MI 1270s   History  Substance Use Topics  . Smoking status: Former Smoker    Types: Cigarettes    Quit date: 11/25/2007  . Smokeless tobacco: Never Used  . Alcohol Use: No     Comment: use to drink "heavy", quit 2009    Review of Systems  Constitutional:  Negative for fever and unexpected weight change.  Gastrointestinal: Negative for constipation.       Neg for fecal incontinence  Genitourinary: Negative for hematuria, flank pain and difficulty urinating.       Negative for urinary incontinence or retention  Musculoskeletal: Positive for back pain.  Neurological: Negative for weakness and numbness.       Negative for saddle paresthesias       Allergies  Review of patient's allergies indicates no known allergies.  Home Medications   Prior to Admission medications   Medication Sig Start Date End Date Taking? Authorizing Provider  albuterol (PROVENTIL HFA;VENTOLIN HFA) 108 (90 BASE) MCG/ACT inhaler Inhale 2 puffs into the lungs every 6 (six) hours as needed. For wheezing and shortness of breath.    Historical Provider, MD  aspirin 81 MG tablet Take 81 mg by mouth daily.    Historical Provider, MD  budesonide-formoterol (SYMBICORT) 160-4.5 MCG/ACT inhaler Inhale 2 puffs into the lungs 2 (two) times daily.    Historical Provider, MD  hydrochlorothiazide (HYDRODIURIL) 25 MG tablet Take 25 mg by mouth daily.    Historical Provider, MD  insulin glargine (LANTUS) 100 UNIT/ML injection Inject 30 Units into the skin daily.     Historical Provider, MD  methylcellulose (ARTIFICIAL TEARS) 1 % ophthalmic solution Place 1 drop into both eyes as needed. For dry eyes    Historical Provider, MD  Multiple Vitamin (MULTIVITAMIN)  tablet Take 1 tablet by mouth daily.    Historical Provider, MD  Omega-3 Fatty Acids (FISH OIL) 1000 MG CAPS Take 1 capsule by mouth 2 (two) times daily.    Historical Provider, MD  phenazopyridine (PYRIDIUM) 200 MG tablet Take 1 tablet (200 mg total) by mouth 3 (three) times daily as needed for pain. 05/10/14   Geoffery Lyons, MD  predniSONE (DELTASONE) 10 MG tablet Take 4 tablets (40 mg total) by mouth daily. 04/08/14   Vanetta Mulders, MD  Travoprost, BAK Free, (TRAVATAN) 0.004 % SOLN ophthalmic solution Place 1 drop into both eyes at  bedtime.    Historical Provider, MD  traZODone (DESYREL) 100 MG tablet Take 100 mg by mouth at bedtime.    Historical Provider, MD   BP 108/62 mmHg  Pulse 72  Temp(Src) 97.9 F (36.6 C) (Oral)  Resp 18  SpO2 94% Physical Exam  Constitutional: He appears well-developed and well-nourished.  HENT:  Head: Normocephalic and atraumatic.  Eyes: Conjunctivae are normal.  Neck: Normal range of motion.  Abdominal: Soft. There is no tenderness. There is no CVA tenderness.  Musculoskeletal: Normal range of motion. He exhibits no tenderness.  No step-off noted with palpation of spine.   Neurological: He is alert. He has normal reflexes. No sensory deficit. He exhibits normal muscle tone. Gait normal.  5/5 strength in entire lower extremities bilaterally. No sensation deficit.   Skin: Skin is warm and dry.  Psychiatric: He has a normal mood and affect.  Nursing note and vitals reviewed.   ED Course  Procedures (including critical care time) Labs Review Labs Reviewed - No data to display  Imaging Review No results found.   EKG Interpretation None      12:18 PM Patient seen and examined.   Vital signs reviewed and are as follows: BP 108/62 mmHg  Pulse 72  Temp(Src) 97.9 F (36.6 C) (Oral)  Resp 18  SpO2 94%  No red flag s/s of low back pain. Patient was counseled on back pain precautions and told to do activity as tolerated but do not lift, push, or pull heavy objects more than 10 pounds for the next week.  Patient counseled to use ice or heat on back for no longer than 15 minutes every hour.   Patient prescribed muscle relaxer and counseled on proper use of muscle relaxant medication.    Patient prescribed narcotic pain medicine and counseled on proper use of narcotic pain medications. Counseled not to combine this medication with others containing tylenol.   Urged patient not to drink alcohol, drive, or perform any other activities that requires focus while taking either of  these medications.  Patient urged to follow-up with PCP if pain does not improve with treatment and rest or if pain becomes recurrent. Urged to return with worsening severe pain, loss of bowel or bladder control, trouble walking.   The patient verbalizes understanding and agrees with the plan.  Work note given for 2 days off and 7 days with no heavy lifting.  MDM   Final diagnoses:  Lumbar strain, initial encounter   Patient with back pain. No neurological deficits. Patient is ambulatory. No warning symptoms of back pain including: fecal incontinence, urinary retention or overflow incontinence, night sweats, waking from sleep with back pain, unexplained fevers or weight loss, h/o cancer, IVDU, recent trauma. No concern for cauda equina, epidural abscess, or other serious cause of back pain. Conservative measures such as rest, ice/heat and pain medicine indicated with PCP follow-up if  no improvement with conservative management.       Renne Crigler, PA-C 12/24/14 1228  Gerhard Munch, MD 12/24/14 [phone]

## 2014-12-24 NOTE — ED Notes (Signed)
Pt presents to department for evaluation of back pain. States he injured back on Friday at work lifting equipment. Now states increased pain. 10/10 pain upon arrival to ED, becomes worse with movement. Pt is alert and oriented x4.

## 2015-05-12 ENCOUNTER — Emergency Department (HOSPITAL_COMMUNITY): Payer: BLUE CROSS/BLUE SHIELD

## 2015-05-12 ENCOUNTER — Observation Stay (HOSPITAL_COMMUNITY)
Admission: EM | Admit: 2015-05-12 | Discharge: 2015-05-14 | Disposition: A | Discharge status: Home / Self Care | Payer: BLUE CROSS/BLUE SHIELD | Attending: Oncology | Admitting: Oncology

## 2015-05-12 ENCOUNTER — Encounter (HOSPITAL_COMMUNITY): Payer: Self-pay | Admitting: Nurse Practitioner

## 2015-05-12 DIAGNOSIS — F10239 Alcohol dependence with withdrawal, unspecified: Secondary | ICD-10-CM

## 2015-05-12 DIAGNOSIS — E860 Dehydration: Secondary | ICD-10-CM

## 2015-05-12 DIAGNOSIS — Z7951 Long term (current) use of inhaled steroids: Secondary | ICD-10-CM | POA: Insufficient documentation

## 2015-05-12 DIAGNOSIS — Z79899 Other long term (current) drug therapy: Secondary | ICD-10-CM | POA: Insufficient documentation

## 2015-05-12 DIAGNOSIS — R197 Diarrhea, unspecified: Secondary | ICD-10-CM | POA: Diagnosis not present

## 2015-05-12 DIAGNOSIS — R1013 Epigastric pain: Secondary | ICD-10-CM | POA: Diagnosis not present

## 2015-05-12 DIAGNOSIS — I2510 Atherosclerotic heart disease of native coronary artery without angina pectoris: Secondary | ICD-10-CM | POA: Insufficient documentation

## 2015-05-12 DIAGNOSIS — J45909 Unspecified asthma, uncomplicated: Secondary | ICD-10-CM

## 2015-05-12 DIAGNOSIS — E871 Hypo-osmolality and hyponatremia: Secondary | ICD-10-CM | POA: Diagnosis not present

## 2015-05-12 DIAGNOSIS — E119 Type 2 diabetes mellitus without complications: Secondary | ICD-10-CM | POA: Diagnosis not present

## 2015-05-12 DIAGNOSIS — Z7982 Long term (current) use of aspirin: Secondary | ICD-10-CM | POA: Diagnosis not present

## 2015-05-12 DIAGNOSIS — I129 Hypertensive chronic kidney disease with stage 1 through stage 4 chronic kidney disease, or unspecified chronic kidney disease: Secondary | ICD-10-CM | POA: Diagnosis not present

## 2015-05-12 DIAGNOSIS — E1159 Type 2 diabetes mellitus with other circulatory complications: Secondary | ICD-10-CM | POA: Diagnosis present

## 2015-05-12 DIAGNOSIS — F1410 Cocaine abuse, uncomplicated: Secondary | ICD-10-CM

## 2015-05-12 DIAGNOSIS — R079 Chest pain, unspecified: Secondary | ICD-10-CM | POA: Diagnosis not present

## 2015-05-12 DIAGNOSIS — E1122 Type 2 diabetes mellitus with diabetic chronic kidney disease: Secondary | ICD-10-CM

## 2015-05-12 DIAGNOSIS — R0789 Other chest pain: Secondary | ICD-10-CM

## 2015-05-12 DIAGNOSIS — Z794 Long term (current) use of insulin: Secondary | ICD-10-CM | POA: Diagnosis not present

## 2015-05-12 DIAGNOSIS — N189 Chronic kidney disease, unspecified: Secondary | ICD-10-CM | POA: Diagnosis not present

## 2015-05-12 DIAGNOSIS — F329 Major depressive disorder, single episode, unspecified: Secondary | ICD-10-CM | POA: Insufficient documentation

## 2015-05-12 DIAGNOSIS — Z87891 Personal history of nicotine dependence: Secondary | ICD-10-CM | POA: Diagnosis not present

## 2015-05-12 DIAGNOSIS — R112 Nausea with vomiting, unspecified: Secondary | ICD-10-CM | POA: Diagnosis not present

## 2015-05-12 DIAGNOSIS — R7989 Other specified abnormal findings of blood chemistry: Secondary | ICD-10-CM

## 2015-05-12 DIAGNOSIS — E785 Hyperlipidemia, unspecified: Secondary | ICD-10-CM

## 2015-05-12 DIAGNOSIS — I10 Essential (primary) hypertension: Secondary | ICD-10-CM | POA: Diagnosis present

## 2015-05-12 DIAGNOSIS — Z9889 Other specified postprocedural states: Secondary | ICD-10-CM | POA: Diagnosis not present

## 2015-05-12 DIAGNOSIS — R778 Other specified abnormalities of plasma proteins: Secondary | ICD-10-CM

## 2015-05-12 DIAGNOSIS — R0602 Shortness of breath: Secondary | ICD-10-CM | POA: Insufficient documentation

## 2015-05-12 LAB — LIPASE, BLOOD: Lipase: 13 U/L — ABNORMAL LOW (ref 22–51)

## 2015-05-12 LAB — CBC WITH DIFFERENTIAL/PLATELET
BASOS ABS: 0 10*3/uL (ref 0.0–0.1)
Basophils Relative: 0 % (ref 0–1)
Eosinophils Absolute: 0.1 10*3/uL (ref 0.0–0.7)
Eosinophils Relative: 3 % (ref 0–5)
HCT: 40.8 % (ref 39.0–52.0)
Hemoglobin: 16 g/dL (ref 13.0–17.0)
LYMPHS PCT: 36 % (ref 12–46)
Lymphs Abs: 1.8 10*3/uL (ref 0.7–4.0)
MCH: 29.1 pg (ref 26.0–34.0)
MCHC: 39.2 g/dL — ABNORMAL HIGH (ref 30.0–36.0)
MCV: 74.2 fL — ABNORMAL LOW (ref 78.0–100.0)
Monocytes Absolute: 0.5 10*3/uL (ref 0.1–1.0)
Monocytes Relative: 9 % (ref 3–12)
NEUTROS ABS: 2.5 10*3/uL (ref 1.7–7.7)
NEUTROS PCT: 52 % (ref 43–77)
PLATELETS: 172 10*3/uL (ref 150–400)
RBC: 5.5 MIL/uL (ref 4.22–5.81)
RDW: 13.4 % (ref 11.5–15.5)
WBC: 4.9 10*3/uL (ref 4.0–10.5)

## 2015-05-12 LAB — COMPREHENSIVE METABOLIC PANEL
ALBUMIN: 3.7 g/dL (ref 3.5–5.0)
ALK PHOS: 78 U/L (ref 38–126)
ALT: 29 U/L (ref 17–63)
AST: 42 U/L — AB (ref 15–41)
Anion gap: 15 (ref 5–15)
BILIRUBIN TOTAL: 0.5 mg/dL (ref 0.3–1.2)
BUN: 11 mg/dL (ref 6–20)
CHLORIDE: 84 mmol/L — AB (ref 101–111)
CO2: 17 mmol/L — ABNORMAL LOW (ref 22–32)
Calcium: 9.4 mg/dL (ref 8.9–10.3)
Creatinine, Ser: 1.3 mg/dL — ABNORMAL HIGH (ref 0.61–1.24)
GFR calc Af Amer: >60 mL/min (ref >60)
GFR calc non Af Amer: 60 mL/min — ABNORMAL LOW (ref >60)
Glucose, Bld: 257 mg/dL — ABNORMAL HIGH (ref 65–99)
POTASSIUM: 4 mmol/L (ref 3.5–5.1)
Sodium: 116 mmol/L — CL (ref 135–145)
Total Protein: 6.2 g/dL — ABNORMAL LOW (ref 6.5–8.1)

## 2015-05-12 LAB — URINALYSIS, ROUTINE W REFLEX MICROSCOPIC
Bilirubin Urine: NEGATIVE
Glucose, UA: >1000 mg/dL — AB
Hgb urine dipstick: NEGATIVE
Ketones, ur: NEGATIVE mg/dL
LEUKOCYTES UA: NEGATIVE
Nitrite: NEGATIVE
PROTEIN: NEGATIVE mg/dL
SPECIFIC GRAVITY, URINE: 1.012 (ref 1.005–1.030)
UROBILINOGEN UA: 0.2 mg/dL (ref 0.0–1.0)
pH: 6.5 (ref 5.0–8.0)

## 2015-05-12 LAB — BASIC METABOLIC PANEL
Anion gap: 11 (ref 5–15)
Anion gap: 8 (ref 5–15)
BUN: 9 mg/dL (ref 6–20)
BUN: 11 mg/dL (ref 6–20)
CHLORIDE: 88 mmol/L — AB (ref 101–111)
CO2: 21 mmol/L — ABNORMAL LOW (ref 22–32)
CO2: 23 mmol/L (ref 22–32)
CREATININE: 1.2 mg/dL (ref 0.61–1.24)
Calcium: 8.8 mg/dL — ABNORMAL LOW (ref 8.9–10.3)
Calcium: 8.7 mg/dL — ABNORMAL LOW (ref 8.9–10.3)
Chloride: 88 mmol/L — ABNORMAL LOW (ref 101–111)
Creatinine, Ser: 1.33 mg/dL — ABNORMAL HIGH (ref 0.61–1.24)
GFR calc Af Amer: >60 mL/min (ref >60)
GFR calc non Af Amer: >60 mL/min (ref >60)
GFR calc non Af Amer: 58 mL/min — ABNORMAL LOW (ref >60)
Glucose, Bld: 243 mg/dL — ABNORMAL HIGH (ref 65–99)
Glucose, Bld: 292 mg/dL — ABNORMAL HIGH (ref 65–99)
POTASSIUM: 3.6 mmol/L (ref 3.5–5.1)
Potassium: 3.6 mmol/L (ref 3.5–5.1)
SODIUM: 119 mmol/L — AB (ref 135–145)
Sodium: 120 mmol/L — ABNORMAL LOW (ref 135–145)

## 2015-05-12 LAB — RAPID URINE DRUG SCREEN, HOSP PERFORMED
Amphetamines: NOT DETECTED
Barbiturates: NOT DETECTED
Benzodiazepines: NOT DETECTED
Cocaine: POSITIVE — AB
OPIATES: NOT DETECTED
Tetrahydrocannabinol: NOT DETECTED

## 2015-05-12 LAB — URINE MICROSCOPIC-ADD ON

## 2015-05-12 LAB — TROPONIN I
TROPONIN I: 0.04 ng/mL — AB (ref <0.031)
Troponin I: 0.03 ng/mL (ref <0.031)

## 2015-05-12 LAB — SODIUM, URINE, RANDOM: Sodium, Ur: 34 mmol/L

## 2015-05-12 LAB — GLUCOSE, CAPILLARY: GLUCOSE-CAPILLARY: 283 mg/dL — AB (ref 65–99)

## 2015-05-12 LAB — ETHANOL: Alcohol, Ethyl (B): 11 mg/dL — ABNORMAL HIGH (ref <5)

## 2015-05-12 LAB — OSMOLALITY, URINE: Osmolality, Ur: 126 mOsm/kg — ABNORMAL LOW (ref 390–1090)

## 2015-05-12 MED ORDER — SODIUM CHLORIDE 0.9 % IV SOLN: INTRAVENOUS | Status: DC

## 2015-05-12 MED ORDER — HYDROCODONE-ACETAMINOPHEN 5-325 MG PO TABS: 1.0000 | ORAL_TABLET | ORAL | Status: DC | PRN

## 2015-05-12 MED ORDER — ALBUTEROL SULFATE (2.5 MG/3ML) 0.083% IN NEBU
2.5000 mg | INHALATION_SOLUTION | RESPIRATORY_TRACT | Status: DC | PRN
Start: 2015-05-12 — End: 2015-05-13

## 2015-05-12 MED ORDER — LORAZEPAM 2 MG/ML IJ SOLN
1.0000 mg | Freq: Four times a day (QID) | INTRAMUSCULAR | Status: DC | PRN
  Administered 2015-05-13: 1 mg via INTRAVENOUS
  Filled 2015-05-12: qty 1

## 2015-05-12 MED ORDER — SODIUM CHLORIDE 0.9 % IJ SOLN
3.0000 mL | Freq: Two times a day (BID) | INTRAMUSCULAR | Status: DC
  Administered 2015-05-12 – 2015-05-14 (×3): 3 mL via INTRAVENOUS

## 2015-05-12 MED ORDER — SODIUM CHLORIDE 0.9 % IV SOLN
INTRAVENOUS | Status: DC
  Administered 2015-05-12: 22:00:00 via INTRAVENOUS

## 2015-05-12 MED ORDER — ASPIRIN 81 MG PO CHEW
324.0000 mg | CHEWABLE_TABLET | Freq: Once | ORAL | Status: AC
  Administered 2015-05-12: 324 mg via ORAL
  Filled 2015-05-12: qty 4

## 2015-05-12 MED ORDER — BRIMONIDINE TARTRATE 0.2 % OP SOLN
1.0000 [drp] | Freq: Three times a day (TID) | OPHTHALMIC | Status: DC
  Administered 2015-05-12 – 2015-05-14 (×5): 1 [drp] via OPHTHALMIC
  Filled 2015-05-12: qty 5

## 2015-05-12 MED ORDER — ONDANSETRON HCL 4 MG/2ML IJ SOLN
4.0000 mg | Freq: Once | INTRAMUSCULAR | Status: AC
  Administered 2015-05-12: 4 mg via INTRAVENOUS
  Filled 2015-05-12: qty 2

## 2015-05-12 MED ORDER — TRAZODONE HCL 100 MG PO TABS
100.0000 mg | ORAL_TABLET | Freq: Every evening | ORAL | Status: DC | PRN
  Administered 2015-05-13: 100 mg via ORAL
  Filled 2015-05-12 (×2): qty 1

## 2015-05-12 MED ORDER — LATANOPROST 0.005 % OP SOLN
1.0000 [drp] | Freq: Every day | OPHTHALMIC | Status: DC
  Administered 2015-05-12 – 2015-05-13 (×2): 1 [drp] via OPHTHALMIC
  Filled 2015-05-12: qty 2.5

## 2015-05-12 MED ORDER — VITAMIN B-1 100 MG PO TABS
100.0000 mg | ORAL_TABLET | Freq: Every day | ORAL | Status: DC
  Administered 2015-05-12 – 2015-05-14 (×3): 100 mg via ORAL
  Filled 2015-05-12 (×3): qty 1

## 2015-05-12 MED ORDER — SODIUM CHLORIDE 0.9 % IV BOLUS (SEPSIS)
1000.0000 mL | Freq: Once | INTRAVENOUS | Status: AC
  Administered 2015-05-12: 1000 mL via INTRAVENOUS

## 2015-05-12 MED ORDER — INSULIN GLARGINE 100 UNIT/ML ~~LOC~~ SOLN
15.0000 [IU] | Freq: Every day | SUBCUTANEOUS | Status: DC
  Administered 2015-05-12 – 2015-05-13 (×2): 15 [IU] via SUBCUTANEOUS
  Filled 2015-05-12 (×3): qty 0.15

## 2015-05-12 MED ORDER — INSULIN ASPART 100 UNIT/ML ~~LOC~~ SOLN
0.0000 [IU] | Freq: Three times a day (TID) | SUBCUTANEOUS | Status: DC
  Administered 2015-05-13: 1 [IU] via SUBCUTANEOUS
  Administered 2015-05-13: 5 [IU] via SUBCUTANEOUS
  Administered 2015-05-14: 3 [IU] via SUBCUTANEOUS

## 2015-05-12 MED ORDER — FOLIC ACID 1 MG PO TABS
1.0000 mg | ORAL_TABLET | Freq: Every day | ORAL | Status: DC
  Administered 2015-05-12 – 2015-05-14 (×3): 1 mg via ORAL
  Filled 2015-05-12 (×3): qty 1

## 2015-05-12 MED ORDER — ONDANSETRON HCL 4 MG/2ML IJ SOLN: 4.0000 mg | Freq: Four times a day (QID) | INTRAMUSCULAR | Status: DC | PRN

## 2015-05-12 MED ORDER — ENOXAPARIN SODIUM 40 MG/0.4ML ~~LOC~~ SOLN
40.0000 mg | SUBCUTANEOUS | Status: DC
  Administered 2015-05-12 – 2015-05-13 (×2): 40 mg via SUBCUTANEOUS
  Filled 2015-05-12 (×3): qty 0.4

## 2015-05-12 MED ORDER — BUDESONIDE-FORMOTEROL FUMARATE 160-4.5 MCG/ACT IN AERO
2.0000 | INHALATION_SPRAY | Freq: Two times a day (BID) | RESPIRATORY_TRACT | Status: DC
  Administered 2015-05-12 – 2015-05-14 (×3): 2 via RESPIRATORY_TRACT
  Filled 2015-05-12 (×2): qty 6

## 2015-05-12 MED ORDER — ONDANSETRON HCL 4 MG PO TABS: 4.0000 mg | ORAL_TABLET | Freq: Four times a day (QID) | ORAL | Status: DC | PRN

## 2015-05-12 MED ORDER — ASPIRIN 81 MG PO CHEW
81.0000 mg | CHEWABLE_TABLET | Freq: Every day | ORAL | Status: DC
  Administered 2015-05-13 – 2015-05-14 (×2): 81 mg via ORAL
  Filled 2015-05-12 (×2): qty 1

## 2015-05-12 MED ORDER — THIAMINE HCL 100 MG/ML IJ SOLN
100.0000 mg | Freq: Every day | INTRAMUSCULAR | Status: DC
  Filled 2015-05-12 (×3): qty 1

## 2015-05-12 MED ORDER — LORAZEPAM 1 MG PO TABS: 1.0000 mg | ORAL_TABLET | Freq: Four times a day (QID) | ORAL | Status: DC | PRN

## 2015-05-12 MED ORDER — PROMETHAZINE HCL 25 MG/ML IJ SOLN
25.0000 mg | Freq: Once | INTRAMUSCULAR | Status: AC
  Administered 2015-05-12: 25 mg via INTRAVENOUS
  Filled 2015-05-12: qty 1

## 2015-05-12 MED ORDER — ADULT MULTIVITAMIN W/MINERALS CH
1.0000 | ORAL_TABLET | Freq: Every day | ORAL | Status: DC
  Administered 2015-05-13 – 2015-05-14 (×2): 1 via ORAL
  Filled 2015-05-12 (×2): qty 1

## 2015-05-12 MED ORDER — INSULIN ASPART 100 UNIT/ML ~~LOC~~ SOLN
0.0000 [IU] | Freq: Every day | SUBCUTANEOUS | Status: DC
  Administered 2015-05-12: 3 [IU] via SUBCUTANEOUS

## 2015-05-12 NOTE — ED Notes (Signed)
Patient returned from X-ray 

## 2015-05-12 NOTE — ED Notes (Signed)
Pt reports he has been drinking alcohol since yesterday and not eating. Today he began to vomit and now hes worried that he is dehydrated because he feels weak. He is A&Ox4, resp e/u.

## 2015-05-12 NOTE — ED Notes (Signed)
Patient transported to X-ray 

## 2015-05-12 NOTE — ED Provider Notes (Signed)
CSN: [id number]     Arrival date & time 05/12/15  1642 History   First MD Initiated Contact with Patient 05/12/15 1657     Chief Complaint  Patient presents with  . Emesis     (Consider location/radiation/quality/duration/timing/severity/associated sxs/prior Treatment) Patient is a 54 y.o. male presenting with vomiting. The history is provided by the patient.  Emesis Severity:  Moderate Associated symptoms: abdominal pain, chills and diarrhea   Associated symptoms: no headaches    patient resents with nausea vomiting some diarrhea. States he has had it for the last 3 days. States began after drinking heavily. States he had some time off and was celebrating for [employer] Day. States he has had some chills. He now has some dull chest pain and shortness of breath. States he has just drank too much. States he has had some sweating.  Past Medical History  Diagnosis Date  . Diabetes mellitus, type 2   . Hypertension   . Asthma   . Pancreatitis   . Coronary artery disease     s/p PCI RCA '10, s/p PCI LAD '11  . Depressed   . HLD (hyperlipidemia)   . CKD (chronic kidney disease)    Past Surgical History  Procedure Laterality Date  . Cardiac catheterization    . Coronary stent placement    . Left heart catheterization with coronary angiogram Bilateral 08/25/2012    Procedure: LEFT HEART CATHETERIZATION WITH CORONARY ANGIOGRAM;  Surgeon: Kathleene Hazel, MD;  Location: [facility] CATH LAB;  Service: Cardiovascular;  Laterality: Bilateral;   Family History  Problem Relation Age of Onset  . Heart disease Mother     MI 78s   History  Substance Use Topics  . Smoking status: Former Smoker    Types: Cigarettes    Quit date: 11/25/2007  . Smokeless tobacco: Never Used  . 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 Negative for activity change and appetite change.  Eyes: Negative for pain.  Respiratory:  Positive for shortness of breath. Negative for chest tightness.   Cardiovascular: Positive for chest pain. Negative for leg swelling.  Gastrointestinal: Positive for nausea, vomiting, abdominal pain and diarrhea.  Genitourinary: Negative for flank pain.  Musculoskeletal: Negative for back pain and neck stiffness.  Skin: Negative for rash.  Neurological: Negative for weakness, numbness and headaches.  Psychiatric/Behavioral: Negative for behavioral problems.      Allergies  Review of patient's allergies indicates no known allergies.  Home Medications   Prior to Admission medications   Medication Sig Start Date End Date Taking? Authorizing Provider  albuterol (PROVENTIL HFA;VENTOLIN HFA) 108 (90 BASE) MCG/ACT inhaler Inhale 2 puffs into the lungs every 6 (six) hours as needed for wheezing or shortness of breath.    Yes Historical Provider, MD  aspirin EC 81 MG tablet Take 81 mg by mouth daily.   Yes Historical Provider, MD  brimonidine (ALPHAGAN) 0.2 % ophthalmic solution Place 1 drop into both eyes daily.  04/11/15  Yes Historical Provider, MD  budesonide-formoterol (SYMBICORT) 160-4.5 MCG/ACT inhaler Inhale 2 puffs into the lungs 2 (two) times daily.   Yes Historical Provider, MD  hydrochlorothiazide (HYDRODIURIL) 25 MG tablet Take 25 mg by mouth daily.   Yes Historical Provider, MD  HYDROcodone-acetaminophen (NORCO/VICODIN) 5-325 MG per tablet Take 1-2 tablets every 6 hours as needed for severe pain 12/24/14  Yes Renne Crigler, PA-C  insulin glargine (LANTUS) 100 UNIT/ML  injection Inject 25 Units into the skin at bedtime.    Yes Historical Provider, MD  latanoprost (XALATAN) 0.005 % ophthalmic solution Place 1 drop into both eyes at bedtime.  04/11/15  Yes Historical Provider, MD  methocarbamol (ROBAXIN) 500 MG tablet Take 2 tablets (1,000 mg total) by mouth 4 (four) times daily. 12/24/14  Yes Renne Crigler, PA-C  Multiple Vitamin (MULTIVITAMIN WITH MINERALS) TABS tablet Take 1 tablet by  mouth daily.   Yes Historical Provider, MD  Omega-3 Fatty Acids (FISH OIL) 1000 MG CAPS Take 1,000 mg by mouth daily.    Yes Historical Provider, MD  traZODone (DESYREL) 100 MG tablet Take 100 mg by mouth at bedtime.   Yes Historical Provider, MD   BP 127/76 mmHg  Pulse 64  Temp(Src) 98.4 F (36.9 C) (Oral)  Resp 18  Ht  (1.803 m)  Wt 161 lb (73.029 kg)  BMI 22.46 kg/m2  SpO2 97% Physical Exam  Constitutional: He appears well-developed.  Patient appears uncomfortable  HENT:  Head: Atraumatic.  Eyes: Pupils are equal, round, and reactive to light.  Neck: Neck supple.  Cardiovascular: Normal rate.   Pulmonary/Chest: Effort normal.  Abdominal: There is tenderness.  Mild epigastric tenderness without rebound or guarding.  Musculoskeletal: Normal range of motion.  Neurological: He is alert.  Skin: Skin is warm.    ED Course  Procedures (including critical care time) Labs Review Labs Reviewed  CBC WITH DIFFERENTIAL/PLATELET - Abnormal; Notable for the following:    MCV 74.2 (*)    MCHC 39.2 (*)    All other components within normal limits  COMPREHENSIVE METABOLIC PANEL - Abnormal; Notable for the following:    Sodium 116 (*)    Chloride 84 (*)    CO2 17 (*)    Glucose, Bld 257 (*)    Creatinine, Ser 1.30 (*)    Total Protein 6.2 (*)    AST 42 (*)    GFR calc non Af Amer 60 (*)    All other components within normal limits  URINALYSIS, ROUTINE W REFLEX MICROSCOPIC (NOT AT [facility]) - Abnormal; Notable for the following:    Glucose, UA >1000 (*)    All other components within normal limits  LIPASE, BLOOD - Abnormal; Notable for the following:    Lipase 13 (*)    All other components within normal limits  TROPONIN I - Abnormal; Notable for the following:    Troponin I 0.04 (*)    All other components within normal limits  ETHANOL - Abnormal; Notable for the following:    Alcohol, Ethyl (B) 11 (*)    All other components within normal limits  BASIC METABOLIC PANEL -  Abnormal; Notable for the following:    Sodium 120 (*)    Chloride 88 (*)    CO2 21 (*)    Glucose, Bld 243 (*)    Calcium 8.8 (*)    All other components within normal limits  URINE RAPID DRUG SCREEN, HOSP PERFORMED - Abnormal; Notable for the following:    Cocaine POSITIVE (*)    All other components within normal limits  OSMOLALITY, URINE - Abnormal; Notable for the following:    Osmolality, Ur 126 (*)    All other components within normal limits  GLUCOSE, CAPILLARY - Abnormal; Notable for the following:    Glucose-Capillary 283 (*)    All other components within normal limits  BASIC METABOLIC PANEL - Abnormal; Notable for the following:    Sodium 119 (*)  Chloride 88 (*)    Glucose, Bld 292 (*)    Creatinine, Ser 1.33 (*)    Calcium 8.7 (*)    GFR calc non Af Amer 58 (*)    All other components within normal limits  CLOSTRIDIUM DIFFICILE BY PCR (NOT AT [facility])  URINE MICROSCOPIC-ADD ON  TROPONIN I  SODIUM, URINE, RANDOM  TROPONIN I  HIV ANTIBODY (ROUTINE TESTING)  BASIC METABOLIC PANEL  BASIC METABOLIC PANEL    Imaging Review Dg Chest 2 View  05/12/2015   CLINICAL DATA:  Chest tightness and shortness of breath for 2 days  EXAM: CHEST  2 VIEW  COMPARISON:  Apr 08, 2014  FINDINGS: There is no edema or consolidation. The heart size and pulmonary vascularity are normal. No pneumothorax. No adenopathy. No bone lesions. There is a rudimentary cervical rib on the right.  IMPRESSION: No edema or consolidation.   Electronically Signed   By: Bretta Bang III M.D.   On: 05/12/2015 17:59     EKG Interpretation   Date/Time:  Saturday May 12 2015 17:08:42 EDT Ventricular Rate:  77 PR Interval:  159 QRS Duration: 82 QT Interval:  403 QTC Calculation: 456 R Axis:   72 Text Interpretation:  Sinus rhythm nonspecific ST changes anteriorly  Confirmed by Zyier Dykema  MD, Harrold Donath [phone]) on 05/12/2015 5:17:00 PM      MDM   Final diagnoses:  Non-intractable vomiting with  nausea, vomiting of unspecified type  Hyponatremia  Elevated troponin    Patient with vomiting diarrhea. Had been drinking heavily. Has hyponatremia. Also nonspecific EKG changes with a minimally elevated troponin. Likely related to demand, and other ischemia thought less likely. Will admit to internal medicine.    Benjiman Core, MD 05/12/15 [phone]

## 2015-05-12 NOTE — ED Notes (Signed)
CRITICAL VALUE ALERT  Critical value received:  NA 116  Date of notification:  05/12/15  Time of notification:  1909  MD notified (1st page): Rubin Payor, [place].

## 2015-05-12 NOTE — H&P (Signed)
Date: 05/12/2015               Patient Name:  Jose [name] MRN: [id number]  DOB: 1961-09-05 Age / Sex: 54 y.o., male   PCP: No Pcp Per Patient         Medical Service: Internal Medicine Teaching Service         Attending Physician: Dr. Aletta Edouard, MD    First Contact: Dr. Heywood Iles Pager: [phone]  Second Contact: Dr. Boykin Peek Pager: [phone]       After Hours (After 5p/  First Contact Pager: [phone]  weekends / holidays): Second Contact Pager: [phone]   Chief Complaint: weakness  History of Present Illness: Jose [name] is a 54 year old man with CAD, DM2, HTN, asthma, CKD, HL who presents with weakness. He was in his usual state of health until he started to drink alcohol yesterday to celebrate Father's Day weekend. He says he drank about six beers and three shots yesterday and two beers today. He also used cocaine yesterday. He has not eaten since yesterday. Today he was nauseous with non-bloody non-bilious emesis and reports a generalized weakness. He is concerned that he is dehydrated. He also notes a R sided chest pain described as dull and non-radiating. He says it is associated with retching for emesis but also has it at rest. He says he had three loose bowel movements in the past day. He denies any fevers, chills, night sweats, SOB, palpitations, abdominal pain, dysuria, numbness, focal weakness. He gets his care mostly at the [facility].  In the ED, he was noted to have a sodium of 116 (corrected 118-120). He received 1 L NS bolus, ASA 324 mg once, zofran 4 mg iv once, and phenergan 25 mg iv once.  Meds: No current facility-administered medications for this encounter.   Current Outpatient Prescriptions  Medication Sig Dispense Refill  . budesonide-formoterol (SYMBICORT) 160-4.5 MCG/ACT inhaler Inhale 2 puffs into the lungs 2 (two) times daily.    . hydrochlorothiazide (HYDRODIURIL) 25 MG tablet Take 25 mg by mouth daily.    . insulin glargine (LANTUS) 100 UNIT/ML  injection Inject 25 Units into the skin daily.     . traZODone (DESYREL) 100 MG tablet Take 100 mg by mouth at bedtime.    Marland Kitchen albuterol (PROVENTIL HFA;VENTOLIN HFA) 108 (90 BASE) MCG/ACT inhaler Inhale 2 puffs into the lungs every 6 (six) hours as needed for wheezing or shortness of breath.     . brimonidine (ALPHAGAN) 0.2 % ophthalmic solution     . gabapentin (NEURONTIN) 600 MG tablet     . HYDROcodone-acetaminophen (NORCO/VICODIN) 5-325 MG per tablet Take 1-2 tablets every 6 hours as needed for severe pain 10 tablet 0  . latanoprost (XALATAN) 0.005 % ophthalmic solution     . methocarbamol (ROBAXIN) 500 MG tablet Take 2 tablets (1,000 mg total) by mouth 4 (four) times daily. 20 tablet 0  . methylcellulose (ARTIFICIAL TEARS) 1 % ophthalmic solution Place 1 drop into both eyes as needed. For dry eyes    . Multiple Vitamin (MULTIVITAMIN) tablet Take 1 tablet by mouth daily.    . Omega-3 Fatty Acids (FISH OIL) 1000 MG CAPS Take 1 capsule by mouth 2 (two) times daily.    . phenazopyridine (PYRIDIUM) 200 MG tablet Take 1 tablet (200 mg total) by mouth 3 (three) times daily as needed for pain. 6 tablet 0  . Travoprost, BAK Free, (TRAVATAN) 0.004 % SOLN ophthalmic solution Place 1 drop into both eyes  at bedtime.      Allergies: Allergies as of 05/12/2015  . (No Known Allergies)   Past Medical History  Diagnosis Date  . Diabetes mellitus, type 2   . Hypertension   . Asthma   . Pancreatitis   . Coronary artery disease     s/p PCI RCA '10, s/p PCI LAD '11  . Depressed   . HLD (hyperlipidemia)   . CKD (chronic kidney disease)    Past Surgical History  Procedure Laterality Date  . Cardiac catheterization    . Coronary stent placement    . Left heart catheterization with coronary angiogram Bilateral 08/25/2012    Procedure: LEFT HEART CATHETERIZATION WITH CORONARY ANGIOGRAM;  Surgeon: Kathleene Hazel, MD;  Location: [facility] CATH LAB;  Service: Cardiovascular;  Laterality: Bilateral;    Family History  Problem Relation Age of Onset  . Heart disease Mother     MI 75s   History   Social History  . Marital Status: Married    Spouse Name: N/A  . Number of Children: N/A  . Years of Education: N/A   Occupational History  . Not on file.   Social History Main Topics  . Smoking status: Former Smoker    Types: Cigarettes    Quit date: 11/25/2007  . Smokeless tobacco: Never Used  . Alcohol Use: No     Comment: use to drink "heavy", quit 2009  . Drug Use: Yes    Special: Cocaine  . Sexual Activity: Yes    Birth Control/ Protection: None   Other Topics Concern  . Not on file   Social History Narrative    Review of Systems: A comprehensive review of systems was negative except for: as noted above per HPI  Physical Exam: Blood pressure 163/91, pulse 67, temperature 97.6 F (36.4 C), temperature source Oral, resp. rate 21, height 5\' 11"  (1.803 m), weight 165 lb (74.844 kg), SpO2 100 %.  Gen: No acute distress, well developed, well nourished HEENT: Atraumatic, PERRL, EOMI, sclerae anicteric, dry mucous membranes Heart: Regular rate and rhythm, normal S1 S2, no murmurs, rubs, or gallops, midchest wall tenderness on palpation Lungs: Clear to auscultation bilaterally, respirations unlabored Abd: Soft, non-tender, non-distended, + bowel sounds, no hepatosplenomegaly, well healed vertical incision Ext: No edema or cyanosis Neuro: A&O x 4, CN II-XII intact, finger-nose-finger coordination intact, strength 5/5 and symmetric in all extremities, sensation grossly intact, no Babinkski sign b/l  Lab results: Basic Metabolic Panel:  Recent Labs  53/74/82 1742  NA 116*  K 4.0  CL 84*  CO2 17*  GLUCOSE 257*  BUN 11  CREATININE 1.30*  CALCIUM 9.4   Liver Function Tests:  Recent Labs  05/12/15 1742  AST 42*  ALT 29  ALKPHOS 78  BILITOT 0.5  PROT 6.2*  ALBUMIN 3.7    Recent Labs  05/12/15 1742  LIPASE 13*   CBC:  Recent Labs  05/12/15 1742   WBC 4.9  NEUTROABS 2.5  HGB 16.0  HCT 40.8  MCV 74.2*  PLT 172   Cardiac Enzymes:  Recent Labs  05/12/15 1742  TROPONINI 0.04*   Urinalysis:  Recent Labs  05/12/15 1817  COLORURINE YELLOW  LABSPEC 1.012  PHURINE 6.5  GLUCOSEU >1000*  HGBUR NEGATIVE  BILIRUBINUR NEGATIVE  KETONESUR NEGATIVE  PROTEINUR NEGATIVE  UROBILINOGEN 0.2  NITRITE NEGATIVE  LEUKOCYTESUR NEGATIVE  rare squamous epithelial cells, 0-2 RBC  Ethanol 11  Imaging results:  Dg Chest 2 View  05/12/2015   CLINICAL DATA:  Chest tightness and shortness of breath for 2 days  EXAM: CHEST  2 VIEW  COMPARISON:  Apr 08, 2014  FINDINGS: There is no edema or consolidation. The heart size and pulmonary vascularity are normal. No pneumothorax. No adenopathy. No bone lesions. There is a rudimentary cervical rib on the right.  IMPRESSION: No edema or consolidation.   Electronically Signed   By: Bretta Bang III M.D.   On: 05/12/2015 17:59    Other results: EKG: NSR, nonspecific t-wave change in V4, otherwise no ischemic changes and unchanged from 04/08/14  Assessment & Plan by Problem: Principal Problem:   Hyponatremia Active Problems:   Diabetes mellitus, type 2   Hypertension   Coronary artery disease   CKD (chronic kidney disease), stage II   Nausea and vomiting   #Hyponatremia: Jose Larmon is likely feeling weak secondary to his hyponatremia. Presenting Na 116, corrected to 118-120 when considering hyperglycemia. This could be beer potomania given his recent excessive alcohol use. He could also be dehydrated from his emesis secondary to alcohol use. He also takes HCTZ 25 mg daily. He already received 1 L NS in the ED. -NS @ 125 cc/hr -BMP now then q6h -urine osmolality, sodium -hold HCTZ -telemtry  #Polysubstance abuse: Jose Dugger says he drank about six beers and 3 shots yesterday and two beers today. He also admits to cocaine use yesterday. He says on average he drinks about 2 beers a day. He denies  any previous history of withdrawal symptoms or seizures. -zofran 4 mg q6h po or iv -CIWA protocol -MVI -folic acid 1 mg po daily -thiamine 100 mg po or iv daily -HIV, UDS  #Atypical chest pain with history of CAD: Jose Oplinger's chest pain is atypical as it is right sided and nothing makes it better or worse. Concern given history of CAD for ACS but initial troponin only 0.04 and no ischemic changes on EKG other than questionable t-wave change in V4. HEART score 3-4 which is low to moderate risk depending on consideration of troponin 0.04. TIMI score 3. Could be musculoskeletal as associated with retching for emesis and some tenderness on palpation of mid chest. Also concern for cocaine vasospasm. No beta blocker due to cocaine use. He is s/p PCI to RCA 2010 and PCI LAD 2011. -trend troponin q6h x 3 -EKG in am -ASA 81 mg daily -telemetry  #DM2: Last hemoglobin A1c in our EMR 8.3 on 08/2012. At home he is on lantus 25 u qhs. -lantus 15 u qhs -SSI sensitive  #HTN: BP in ED 140s-160s/70s-90s. At home he is on HCTZ 25 mg daily.  -hold HCTZ in setting of hyponatremia and dehydration  #CKD: Presenting creatinine 1.30 with baseline ~1.5 -cont to monitor as above  #Asthma: At home he is on symbicort 2 puff bid, albuterol 2 puff q6hprn] -symbicort 2 puff bid -albuterol neb q2hprn  #Hyperlipidemia: Last lipid profile 08/2012 cholesterol 129, LDL 72, HDL 37, triglyceride 98. No statin on EMR. -primary team consider starting statin  #Diet: HH  #DVT PPx: lovenox 40 mg Mead daily  #Code: Full   Dispo: Disposition is deferred at this time, awaiting improvement of current medical problems. Anticipated discharge in approximately 1 day(s).   The patient does not know have a current PCP (No Pcp Per Patient) and does need an [facility] hospital follow-up appointment after discharge.  The patient does not know have transportation limitations that hinder transportation to clinic appointments.  Signed: Lorenda Hatchet, MD 05/12/2015, 8:52 PM

## 2015-05-12 NOTE — ED Notes (Signed)
Put fluid bolus on a pump due to not infusing quickly by gravity.

## 2015-05-13 DIAGNOSIS — R0789 Other chest pain: Secondary | ICD-10-CM | POA: Diagnosis not present

## 2015-05-13 DIAGNOSIS — F10239 Alcohol dependence with withdrawal, unspecified: Secondary | ICD-10-CM | POA: Diagnosis not present

## 2015-05-13 DIAGNOSIS — E871 Hypo-osmolality and hyponatremia: Secondary | ICD-10-CM | POA: Diagnosis not present

## 2015-05-13 DIAGNOSIS — E860 Dehydration: Secondary | ICD-10-CM | POA: Diagnosis not present

## 2015-05-13 LAB — BASIC METABOLIC PANEL
ANION GAP: 6 (ref 5–15)
Anion gap: 6 (ref 5–15)
Anion gap: 9 (ref 5–15)
BUN: 10 mg/dL (ref 6–20)
BUN: 12 mg/dL (ref 6–20)
BUN: 19 mg/dL (ref 6–20)
CHLORIDE: 98 mmol/L — AB (ref 101–111)
CO2: 21 mmol/L — ABNORMAL LOW (ref 22–32)
CO2: 24 mmol/L (ref 22–32)
CO2: 22 mmol/L (ref 22–32)
CREATININE: 1.27 mg/dL — AB (ref 0.61–1.24)
CREATININE: 1.3 mg/dL — AB (ref 0.61–1.24)
Calcium: 8.5 mg/dL — ABNORMAL LOW (ref 8.9–10.3)
Calcium: 8.6 mg/dL — ABNORMAL LOW (ref 8.9–10.3)
Calcium: 9.2 mg/dL (ref 8.9–10.3)
Chloride: 95 mmol/L — ABNORMAL LOW (ref 101–111)
Chloride: 96 mmol/L — ABNORMAL LOW (ref 101–111)
Creatinine, Ser: 1.27 mg/dL — ABNORMAL HIGH (ref 0.61–1.24)
GFR calc Af Amer: >60 mL/min (ref >60)
GFR calc non Af Amer: >60 mL/min (ref >60)
GFR, EST NON AFRICAN AMERICAN: 60 mL/min — AB (ref >60)
GLUCOSE: 140 mg/dL — AB (ref 65–99)
GLUCOSE: 128 mg/dL — AB (ref 65–99)
Glucose, Bld: 81 mg/dL (ref 65–99)
POTASSIUM: 4.5 mmol/L (ref 3.5–5.1)
Potassium: 3.9 mmol/L (ref 3.5–5.1)
Potassium: 4.4 mmol/L (ref 3.5–5.1)
SODIUM: 125 mmol/L — AB (ref 135–145)
SODIUM: 127 mmol/L — AB (ref 135–145)
Sodium: 125 mmol/L — ABNORMAL LOW (ref 135–145)

## 2015-05-13 LAB — GLUCOSE, CAPILLARY
GLUCOSE-CAPILLARY: 118 mg/dL — AB (ref 65–99)
GLUCOSE-CAPILLARY: 215 mg/dL — AB (ref 65–99)
Glucose-Capillary: 137 mg/dL — ABNORMAL HIGH (ref 65–99)
Glucose-Capillary: 78 mg/dL (ref 65–99)

## 2015-05-13 LAB — TROPONIN I: TROPONIN I: 0.04 ng/mL — AB (ref <0.031)

## 2015-05-13 LAB — URIC ACID: Uric Acid, Serum: 2.8 mg/dL — ABNORMAL LOW (ref 4.4–7.6)

## 2015-05-13 MED ORDER — ALBUTEROL SULFATE (2.5 MG/3ML) 0.083% IN NEBU
2.5000 mg | INHALATION_SOLUTION | RESPIRATORY_TRACT | Status: DC | PRN
  Administered 2015-05-14: 2.5 mg via RESPIRATORY_TRACT
  Filled 2015-05-13: qty 3

## 2015-05-13 MED ORDER — SODIUM CHLORIDE 0.9 % IV SOLN
INTRAVENOUS | Status: DC
  Administered 2015-05-13: 18:00:00 via INTRAVENOUS

## 2015-05-13 MED ORDER — SODIUM CHLORIDE 0.9 % IV SOLN
INTRAVENOUS | Status: DC
  Administered 2015-05-13: 14:00:00 via INTRAVENOUS

## 2015-05-13 MED ORDER — ENSURE ENLIVE PO LIQD
237.0000 mL | Freq: Two times a day (BID) | ORAL | Status: DC
  Administered 2015-05-13 (×2): 237 mL via ORAL

## 2015-05-13 MED ORDER — SODIUM CHLORIDE 0.9 % IV SOLN
INTRAVENOUS | Status: DC
  Administered 2015-05-13: 23:00:00 via INTRAVENOUS

## 2015-05-13 NOTE — Progress Notes (Signed)
Called into the patient's room again as directed by secretary,complaining of shortness of breathing,verbalized sympstons as what i have had explained to him earlier,he was asking the nurse to get his inhaler.Nurse explained to him i need to assess him first.Patient breathing is normal,asymptomatic,clear sounds on auscultations on both lungs fields AND 100% on room air saturation.Patient educated about the needs and uses of inhaler appropriately.

## 2015-05-13 NOTE — Progress Notes (Signed)
Called into aptient's room as directed by secretary,patient instructed this nurse to get his inhaler at his personal belonging inside the locker.Nurse asked patient what for he has a breathing treatment and patient responded back "i don't know".Nurse explained what is that inhaler and explained to the patient that he doesn't need one to have that inhaler right now,and if he needed ,we're going to call his doctor to prescribed one to him.Patient agreed.

## 2015-05-13 NOTE — Progress Notes (Signed)
Subjective: This morning, he reports he feels better. He says his chest pain has resolved. He was able to tolerate breakfast though reported some nausea for which she received Zofran that improved his symptoms. He still reports diarrhea that has been now ongoing for 3 days. Telemetry overnight notable only for occasional PVCs.  Objective: Vital signs in last 24 hours: Filed Vitals:   05/12/15 2205 05/12/15 2210 05/13/15 0523 05/13/15 0823  BP: 137/79 127/76 143/81 137/81  Pulse: 70 64 52 57  Temp:  98.4 F (36.9 C) 98.2 F (36.8 C) 98.8 F (37.1 C)  TempSrc:    Oral  Resp:  18 17 18   Height:  5\' 11"  (1.803 m)    Weight:  161 lb (73.029 kg)    SpO2:  97% 100% 100%   Weight change:   Intake/Output Summary (Last 24 hours) at 05/13/15 1150 Last data filed at 05/13/15 1144  Gross per 24 hour  Intake    480 ml  Output   1075 ml  Net   -595 ml   General: African-American male, resting in bed, NAD HEENT: PERRL, EOMI, no scleral icterus, oropharynx clear Cardiac: RRR, no rubs, murmurs or gallops Pulm: clear to auscultation bilaterally, no wheezes, rales, or rhonchi Abd: soft, nontender, nondistended, BS present Ext: warm and well perfused, no pedal edema Neuro: responds to questions appropriately; moving all extremities freely  Lab Results: Basic Metabolic Panel:  Recent Labs Lab 05/12/15 2302 05/13/15 0549  NA 119* 125*  K 3.6 3.9  CL 88* 98*  CO2 23 21*  GLUCOSE 292* 140*  BUN 11 10  CREATININE 1.33* 1.27*  CALCIUM 8.7* 8.5*   Liver Function Tests:  Recent Labs Lab 05/12/15 1742  AST 42*  ALT 29  ALKPHOS 78  BILITOT 0.5  PROT 6.2*  ALBUMIN 3.7    Recent Labs Lab 05/12/15 1742  LIPASE 13*   CBC:  Recent Labs Lab 05/12/15 1742  WBC 4.9  NEUTROABS 2.5  HGB 16.0  HCT 40.8  MCV 74.2*  PLT 172   Cardiac Enzymes:  Recent Labs Lab 05/12/15 1742 05/12/15 2302 05/13/15 0549  TROPONINI 0.04* 0.03 0.04*   CBG:  Recent Labs Lab  05/12/15 2205 05/13/15 0729  GLUCAP 283* 137*   Urine Drug Screen: Drugs of Abuse     Component Value Date/Time   LABOPIA NONE DETECTED 05/12/2015 2040   COCAINSCRNUR POSITIVE* 05/12/2015 2040   LABBENZ NONE DETECTED 05/12/2015 2040   AMPHETMU NONE DETECTED 05/12/2015 2040   THCU NONE DETECTED 05/12/2015 2040   LABBARB NONE DETECTED 05/12/2015 2040    Alcohol Level:  Recent Labs Lab 05/12/15 1742  ETH 11*   Urinalysis:  Recent Labs Lab 05/12/15 1817  COLORURINE YELLOW  LABSPEC 1.012  PHURINE 6.5  GLUCOSEU >1000*  HGBUR NEGATIVE  BILIRUBINUR NEGATIVE  KETONESUR NEGATIVE  PROTEINUR NEGATIVE  UROBILINOGEN 0.2  NITRITE NEGATIVE  LEUKOCYTESUR NEGATIVE   Studies/Results: Dg Chest 2 View  05/12/2015   CLINICAL DATA:  Chest tightness and shortness of breath for 2 days  EXAM: CHEST  2 VIEW  COMPARISON:  Apr 08, 2014  FINDINGS: There is no edema or consolidation. The heart size and pulmonary vascularity are normal. No pneumothorax. No adenopathy. No bone lesions. There is a rudimentary cervical rib on the right.  IMPRESSION: No edema or consolidation.   Electronically Signed   By: Bretta Bang III M.D.   On: 05/12/2015 17:59   Medications: I have reviewed the patient's current medications. Scheduled Meds: .  aspirin  81 mg Oral Daily  . brimonidine  1 drop Both Eyes TID  . budesonide-formoterol  2 puff Inhalation BID  . enoxaparin (LOVENOX) injection  40 mg Subcutaneous Q24H  . feeding supplement (ENSURE ENLIVE)  237 mL Oral BID BM  . folic acid  1 mg Oral Daily  . insulin aspart  0-15 Units Subcutaneous TID WC  . insulin aspart  0-5 Units Subcutaneous QHS  . insulin glargine  15 Units Subcutaneous QHS  . latanoprost  1 drop Both Eyes QHS  . multivitamin with minerals  1 tablet Oral Daily  . sodium chloride  3 mL Intravenous Q12H  . thiamine  100 mg Oral Daily   Or  . thiamine  100 mg Intravenous Daily   Continuous Infusions:  PRN Meds:.albuterol,  HYDROcodone-acetaminophen, LORazepam **OR** LORazepam, ondansetron **OR** ondansetron (ZOFRAN) IV, traZODone Assessment/Plan:  Mr. Hyde is a 54 year old male with CAD status post PCI, ongoing polysubstance abuse, type 2 diabetes, hypertension, hyperlipidemia, CKD hospitalized for atypical chest pain and hyponatremia now with overall improvement.  Hyponatremia: Corrected sodium 126, improved from 119 on admission. Given low urine osmolality and normal urine sodium, beer potomania suggestive. Ongoing diarrhea may be contributory along with his HCTZ use at home. -Follow-up BMP now then q6h -Consider Imodium for symptom relief -Holding HCTZ  Polysubstance abuse: Mr Buchheit says he drank about six beers and 3 shots yesterday and two beers today along with cocaine use yesterday. He averages 2 beers a day and denies any previous history of withdrawal symptoms or seizures. No CIWA score started overnight. HIV reassuring. UDS consistent with cocaine use.  -Continue zofran 4 mg q6h po or iv -Continue CIWA protocol -Continue multivitamin, folate, thiamine   Atypical chest pain with history of CAD: Resolved by the time of exam and likely in the setting of vomiting though cannot exclude coronary bouts of spasm given his self-report cocaine use and prior CAD [s/p PCI to RCA 2010 and PCI LAD 2011]. Telemetry findings and troponins otherwise reassuring overnight. EKG findings otherwise stable. -Continue ASA 81 mg daily -Discontinue telemetry  DM2: Last hemoglobin A1c in our EMR 8.3 on 08/2012. At home he is on lantus 25 u qhs. -Continue lantus 15 u qhs -Continue SSI sensitive  HTN: Systolic BP mostly 130s to 140s.  -Holding HCTZ as noted above  CKD: Creatinine 1.3 most recent BMET, stable from admission with baseline ~1.5 -As noted above with BMET's  Asthma: Home medications include symbicort 2 puff bid, albuterol 2 puff q6hprn] -Continue home medications  Hyperlipidemia: Last lipid profile 08/2012  cholesterol 129, LDL 72, HDL 37, triglyceride 98. No statin on EMR. -Recommend starting statin to PCP  #FEN:  -Diet: Heart healthy  #DVT prophylaxis: Lovenox  #CODE STATUS: FULL CODE  Dispo: Disposition is deferred at this time, awaiting improvement of current medical problems. Possibly today or tomorrow pending resolution of symptoms.  The patient does have a current PCP (No Pcp Per Patient) and does not need an [facility] hospital follow-up appointment after discharge.  The patient does not know have transportation limitations that hinder transportation to clinic appointments.  .Services Needed at time of discharge: Y = Yes, Blank = No PT:   OT:   RN:   Equipment:   Other:       Beather Arbour, MD 05/13/2015, 11:50 AM

## 2015-05-13 NOTE — Discharge Summary (Signed)
Name: Jose [name] MRN: [id number] DOB: Feb 09, 1961 54 y.o. PCP: No Pcp Per Patient  Date of Admission: 05/12/2015  4:53 PM Date of Discharge: 05/14/2015 Attending Physician: Levert Feinstein, MD  Discharge Diagnosis:  Hyponatremia Polysubstance abuse Atypical chest pain History of coronary artery disease Type 2 diabetes Hypertension Hyperlipidemia  Discharge Medications:   Medication List    STOP taking these medications        hydrochlorothiazide 25 MG tablet  Commonly known as:  HYDRODIURIL      TAKE these medications        albuterol 108 (90 BASE) MCG/ACT inhaler  Commonly known as:  PROVENTIL HFA;VENTOLIN HFA  Inhale 2 puffs into the lungs every 6 (six) hours as needed for wheezing or shortness of breath.     aspirin EC 81 MG tablet  Take 81 mg by mouth daily.     brimonidine 0.2 % ophthalmic solution  Commonly known as:  ALPHAGAN  Place 1 drop into both eyes daily.     budesonide-formoterol 160-4.5 MCG/ACT inhaler  Commonly known as:  SYMBICORT  Inhale 2 puffs into the lungs 2 (two) times daily.     Fish Oil 1000 MG Caps  Take 1,000 mg by mouth daily.     HYDROcodone-acetaminophen 5-325 MG per tablet  Commonly known as:  NORCO/VICODIN  Take 1-2 tablets every 6 hours as needed for severe pain     insulin glargine 100 UNIT/ML injection  Commonly known as:  LANTUS  Inject 25 Units into the skin at bedtime.     latanoprost 0.005 % ophthalmic solution  Commonly known as:  XALATAN  Place 1 drop into both eyes at bedtime.     methocarbamol 500 MG tablet  Commonly known as:  ROBAXIN  Take 2 tablets (1,000 mg total) by mouth 4 (four) times daily.     multivitamin with minerals Tabs tablet  Take 1 tablet by mouth daily.     traZODone 100 MG tablet  Commonly known as:  DESYREL  Take 100 mg by mouth at bedtime.        Disposition and follow-up:   Jose [name] was discharged from [facility] in Stable condition.  At the hospital  follow up visit please address:  Polysubstance abuse: Cocaine, alcohol  Hyperlipidemia: Consider need for statin therapy  Labs / imaging needed at time of follow-up:  BMET: Reassess hyponatremia    Follow-up Appointments: Follow-up Information    Follow up with [facility].   Specialty:  General Practice   Why:  a hospital follow up appointment [facility] [place])    Contact information:   [address] [place] [address] [phone]       Discharge Instructions: Discharge Instructions    Call MD for:  extreme fatigue    Complete by:  As directed      Call MD for:  persistant dizziness or light-headedness    Complete by:  As directed      Diet - low sodium heart healthy    Complete by:  As directed      Discharge instructions    Complete by:  As directed   Please do not take hydrochlorothiazide anymore for your blood pressure.  This may have contributed to your low sodium.     Increase activity slowly    Complete by:  As directed            Consultations:    Procedures Performed:  Dg Chest 2 View  05/12/2015  CLINICAL DATA:  Chest tightness and shortness of breath for 2 days  EXAM: CHEST  2 VIEW  COMPARISON:  Apr 08, 2014  FINDINGS: There is no edema or consolidation. The heart size and pulmonary vascularity are normal. No pneumothorax. No adenopathy. No bone lesions. There is a rudimentary cervical rib on the right.  IMPRESSION: No edema or consolidation.   Electronically Signed   By: Bretta Bang III M.D.   On: 05/12/2015 17:59   Admission HPI: Jose [name] is a 54 year old man with CAD, DM2, HTN, asthma, CKD, HL who presents with weakness. He was in his usual state of health until he started to drink alcohol yesterday to celebrate Father's Day weekend. He says he drank about six beers and three shots yesterday and two beers today. He also used cocaine yesterday. He has not eaten since yesterday. Today he was nauseous with non-bloody non-bilious emesis  and reports a generalized weakness. He is concerned that he is dehydrated. He also notes a R sided chest pain described as dull and non-radiating. He says it is associated with retching for emesis but also has it at rest. He says he had three loose bowel movements in the past day. He denies any fevers, chills, night sweats, SOB, palpitations, abdominal pain, dysuria, numbness, focal weakness. He gets his care mostly at the [facility].  In the ED, he was noted to have a sodium of 116 (corrected 118-120). He received 1 L NS bolus, ASA 324 mg once, zofran 4 mg iv once, and phenergan 25 mg iv once.  Hospital Course by problem list:   Hyponatremia: Likely secondary to the report of mania with ongoing diarrhea and diuretic use contributory. He was given normal saline and his corrected sodium improved from 119 on admission to 128. Urine studies, notably low urine osmolality and normal urine sodium, were suggestive of beer potomania suggestive. On discharge, HCTZ was discontinued, and reinitiation should reconsider at the time of follow-up.  Polysubstance abuse: On admission, he reported consuming alcohol and cocaine. He averages 2 beers a day and denies any previous history of withdrawal symptoms or seizures. UDS consistent with cocaine use, and serum alcohol level was 11. He was monitored on CIWA protocol and received multivitamin, folate, thiamine throughout his hospital stay. HIV reassuring. At the time of follow-up, substance abuse should be reassessed.  Atypical chest pain with history of CAD: Resolved by hospital day 1 and likely in the setting of vomiting though cannot exclude coronary vasospasm given his self-report cocaine use and prior CAD [s/p PCI to RCA 2010 and PCI LAD 2011]. Telemetry findings and troponins otherwise reassuring overnight. EKG findings otherwise stable.  DM2: Last hemoglobin A1c in our EMR 8.3 on 08/2012. He was continued on lantus 15 u qhs and sensitive sliding scale  insulin.  HTN: He remained normotensive off HCTZ.  CKD: Creatinine remains stable at 1.3 throughout his admission which is consistent with his baseline ~1.5.  Asthma: Remained stable on her medications.  Hyperlipidemia: Last lipid profile 08/2012 cholesterol 129, LDL 72, HDL 37, triglyceride 98. At the time of follow-up, please assess the need for statin therapy.   Discharge Vitals:   BP 139/67 mmHg  Pulse 65  Temp(Src) 98.3 F (36.8 C) (Oral)  Resp 18  Ht  (1.803 m)  Wt 73.846 kg (162 lb 12.8 oz)  BMI 22.72 kg/m2  SpO2 99%  Discharge Labs:  Results for orders placed or performed during the hospital encounter of 05/12/15 (from the past  24 hour(s))  Uric acid     Status: Abnormal   Collection Time: 05/13/15  2:30 PM  Result Value Ref Range   Uric Acid, Serum 2.8 (L) 4.4 - 7.6 mg/dL  Glucose, capillary     Status: Abnormal   Collection Time: 05/13/15  4:27 PM  Result Value Ref Range   Glucose-Capillary 215 (H) 65 - 99 mg/dL  Basic metabolic panel     Status: Abnormal   Collection Time: 05/13/15  8:21 PM  Result Value Ref Range   Sodium 127 (L) 135 - 145 mmol/L   Potassium 4.4 3.5 - 5.1 mmol/L   Chloride 96 (L) 101 - 111 mmol/L   CO2 22 22 - 32 mmol/L   Glucose, Bld 81 65 - 99 mg/dL   BUN 19 6 - 20 mg/dL   Creatinine, Ser 1.85 (H) 0.61 - 1.24 mg/dL   Calcium 9.2 8.9 - 50.1 mg/dL   GFR calc non Af Amer >60 >60 mL/min   GFR calc Af Amer >60 >60 mL/min   Anion gap 9 5 - 15  Glucose, capillary     Status: None   Collection Time: 05/13/15  8:58 PM  Result Value Ref Range   Glucose-Capillary 78 65 - 99 mg/dL  Basic metabolic panel     Status: Abnormal   Collection Time: 05/14/15  5:41 AM  Result Value Ref Range   Sodium 128 (L) 135 - 145 mmol/L   Potassium 4.1 3.5 - 5.1 mmol/L   Chloride 100 (L) 101 - 111 mmol/L   CO2 22 22 - 32 mmol/L   Glucose, Bld 144 (H) 65 - 99 mg/dL   BUN 17 6 - 20 mg/dL   Creatinine, Ser 5.86 (H) 0.61 - 1.24 mg/dL   Calcium 8.8 (L) 8.9  - 10.3 mg/dL   GFR calc non Af Amer 60 (L) >60 mL/min   GFR calc Af Amer >60 >60 mL/min   Anion gap 6 5 - 15  Glucose, capillary     Status: Abnormal   Collection Time: 05/14/15  7:52 AM  Result Value Ref Range   Glucose-Capillary 124 (H) 65 - 99 mg/dL  Glucose, capillary     Status: Abnormal   Collection Time: 05/14/15 11:39 AM  Result Value Ref Range   Glucose-Capillary 172 (H) 65 - 99 mg/dL    Signed: Beather Arbour, MD 05/16/2015, 7:52 AM    Services Ordered on Discharge: None Equipment Ordered on Discharge: None

## 2015-05-14 DIAGNOSIS — F1410 Cocaine abuse, uncomplicated: Secondary | ICD-10-CM | POA: Diagnosis not present

## 2015-05-14 DIAGNOSIS — F10239 Alcohol dependence with withdrawal, unspecified: Secondary | ICD-10-CM | POA: Diagnosis not present

## 2015-05-14 DIAGNOSIS — E871 Hypo-osmolality and hyponatremia: Secondary | ICD-10-CM | POA: Diagnosis not present

## 2015-05-14 DIAGNOSIS — E785 Hyperlipidemia, unspecified: Secondary | ICD-10-CM | POA: Diagnosis not present

## 2015-05-14 LAB — BASIC METABOLIC PANEL
Anion gap: 6 (ref 5–15)
BUN: 17 mg/dL (ref 6–20)
CALCIUM: 8.8 mg/dL — AB (ref 8.9–10.3)
CO2: 22 mmol/L (ref 22–32)
Chloride: 100 mmol/L — ABNORMAL LOW (ref 101–111)
Creatinine, Ser: 1.3 mg/dL — ABNORMAL HIGH (ref 0.61–1.24)
GFR calc Af Amer: >60 mL/min (ref >60)
GFR, EST NON AFRICAN AMERICAN: 60 mL/min — AB (ref >60)
GLUCOSE: 144 mg/dL — AB (ref 65–99)
POTASSIUM: 4.1 mmol/L (ref 3.5–5.1)
SODIUM: 128 mmol/L — AB (ref 135–145)

## 2015-05-14 LAB — HIV ANTIBODY (ROUTINE TESTING W REFLEX): HIV Screen 4th Generation wRfx: NONREACTIVE

## 2015-05-14 LAB — GLUCOSE, CAPILLARY
GLUCOSE-CAPILLARY: 124 mg/dL — AB (ref 65–99)
Glucose-Capillary: 172 mg/dL — ABNORMAL HIGH (ref 65–99)

## 2015-05-14 NOTE — Progress Notes (Addendum)
Nutrition Brief Note  Patient identified on the Malnutrition Screening Tool (MST) Report  Wt Readings from Last 15 Encounters:  05/13/15 162 lb 12.8 oz (73.846 kg)  05/10/14 175 lb (79.379 kg)  12/06/13 180 lb (81.647 kg)  11/27/13 180 lb (81.647 kg)  10/03/13 175 lb (79.379 kg)  08/26/12 185 lb 6.5 oz (84.1 kg)  07/05/12 173 lb (78.472 kg)    Body mass index is 22.72 kg/(m^2). Patient meets criteria for normal based on current BMI. Pt reports weight loss was intentional.   Current diet order is heart, patient is consuming approximately 100% of meals at this time. Pt currently has Ensure ordered and reports he would like to continue with them.Labs and medications reviewed.   No nutrition interventions warranted at this time. If nutrition issues arise, please consult RD.   Roslyn Smiling, MS, RD, LDN Pager # [phone] After hours/ weekend pager # [phone]

## 2015-05-14 NOTE — Progress Notes (Signed)
Patient ID: Jose [name], male   DOB: Apr 02, 1961, 54 y.o.   MRN: [id number] Medicine attending discharge note: I personally examined this patient on the day of discharge and I attest to the accuracy of the discharge evaluation and management plan as recorded by resident physician Dr. Alvina Chou.  Clinical summary: 54 year old man who admits to drinking alcohol heavily and doing "2 lines of another drug" on the night before admission. This led to intractable vomiting. On presentation, blood alcohol level 11 and urine drug screen positive for cocaine. Serum sodium level 116. BUN 11, creatinine 1.3. Random glucose 257. Anion gap 15. Serum bicarbonate 17. He received saline hydration, sedation on the alcohol withdrawal protocol, and B vitamin supplementation. Condition stabilized rapidly. He is asymptomatic at time of discharge. Sodium level up to 128. Anion gap down to 6. Creatinine remains at his baseline of 1.3.  Disposition: Condition stable at time of discharge. There were no complications.

## 2015-05-14 NOTE — Clinical Social Work Note (Addendum)
SBIRT completed, filed under flowsheets.  Deretha Emory, MSW Clinical Social Work: Emergency Room [phone]

## 2015-05-14 NOTE — Clinical Social Work Note (Signed)
Clinical Social Work Assessment  Patient Details  Name: Jose [name] MRN: [id number] Date of Birth: [birth year]  Date of referral:  05/14/15               Reason for consult:  Substance Use/ETOH Abuse                Permission sought to share information with:  Case Manager Permission granted to share information::  No (wants to talk to wife on his own)  Name::     NA  Agency::  NA  Relationship::     Contact Information:     Housing/Transportation Living arrangements for the past 2 months:  Single Family Home Source of Information:  Patient, Medical Team Patient Interpreter Needed:  None Criminal Activity/Legal Involvement Pertinent to Current Situation/Hospitalization:  No - Comment as needed Significant Relationships:  Other Family Members, Spouse Lives with:  Spouse Do you feel safe going back to the place where you live?  Yes Need for family participation in patient care:  Yes (Comment) (Asking for more information about his medical problem. WIfe concerned, wants to know more.  patient can make his owndecisions)  Care giving concerns:  Patient lives at home with his wife. No care giving concerns noted at this time.  Patient reports he works full time, lives independently with wife.  Wants to go back home.     Social Worker assessment / plan:  LCSW consulted for current substance abuse as patient arrives to hospital with dehydration after a long days work in the sun, drinking multiple alcoholic beverages, and using cocaine.  Patient very appropriate with LCSW discussed SA use, reports prior to this use he has completed the 35 day [facility] program and has been clean for 6 years. Reports wife does NOT know about his current use and wants to inform her himself, thus per SA laws, LCSW will not contact wife via patient request.  Patient reports he is more concern with his acute kidney infection and asking to know more about this so he can explain to his wife.  LCSW spent time with  patient on cessation of alcohol and drugs and patient reports he will stop. Reports this will not be difficult for him as this is not a daily habit, just a celebration this past weekend.  LCSW and patient discussed other ways to stay healthy including diet, exercise, and coping mechanism in effort to cease drugs and alcohol.  Patient appears motivated, but still refusing outpatient services at this time.    Employment status:  [employer] information:  Managed Care PT Recommendations:  Not assessed at this time Information / Referral to community resources:  SBIRT  Patient/Family's Response to care:  Patient appreciative of discussion, but refusing any outpatient assistance.  Patient/Family's Understanding of and Emotional Response to Diagnosis, Current Treatment, and Prognosis:  Patient at this time appears motivated to stop using drugs and alcohol AEB fear and worry with medical problems reporting "I want to live some more years".  Patient still in the pre contemplation mode of change and reports he does not need assistance.  Emotional Assessment Appearance:  Appears stated age Attitude/Demeanor/Rapport:  Other (Engaged, approriate with questions about health) Affect (typically observed):  Accepting, Hopeful, Pleasant Orientation:  Oriented to Self, Oriented to Place, Oriented to  Time, Oriented to Situation Alcohol / Substance use:  Alcohol Use, Illicit Drugs Psych involvement (Current and /or in the community):  No (Comment)  Discharge Needs  Concerns to be addressed:  Patient refuses services Readmission within the last 30 days:  No Current discharge risk:  Substance Abuse Barriers to Discharge:  No Barriers Identified, Continued Medical Work up   Raye Sorrow, LCSW 05/14/2015, 11:04 AM

## 2015-05-14 NOTE — Discharge Instructions (Signed)
Please keep your follow-up appointments; this is very important for your continued recovery.   ° °We have made the following additions/changes to your medications:  °Please refer to your medication list.   ° °Please continue to take all of your medications as prescribed.  Do not miss any doses without contacting your primary physician.  If you have questions, please contact your physician or contact the Internal Medicine Teaching Service at [phone]. ° °Please bring your medicications with you to your appointments; medications may be eye drops, herbals, vitamins, or pills.   ° °If you believe you are suffering from a life-threatening emergency, go to the nearest Emergency Department.   ° ° ° °Community Resource Guide ° °1) Find a Doctor and Pay Out of Pocket °Although you won't have to find out who is covered by your insurance plan, it is a good idea to ask around and get recommendations. You will then need to call the office and see if the doctor you have chosen will accept you as a new patient and what types of options they offer for patients who are self-pay. Some doctors offer discounts or will set up payment plans for their patients who do not have insurance, but you will need to ask so you aren't surprised when you get to your appointment. ° °2) Contact Your Local Health Department °Not all health departments have doctors that can see patients for sick visits, but many do, so it is worth a call to see if yours does. If you don't know where your local health department is, you can check in your phone book. The CDC also has a tool to help you locate your state's health department, and many state websites also have listings of all of their local health departments. ° °3) Find a Walk-in Clinic °If your illness is not likely to be very severe or complicated, you may want to try a walk in clinic. These are popping up all over the country in pharmacies, drugstores, and shopping centers. They're usually staffed by  nurse practitioners or physician assistants that have been trained to treat common illnesses and complaints. They're usually fairly quick and inexpensive. However, if you have serious medical issues or chronic medical problems, these are probably not your best option. ° °No Primary Care Doctor: °- Call [facility] at  832-8000 - they can help you locate a primary care doctor that  accepts your insurance, provides certain services, etc. °- Physician Referral Service- 1-800-533-3463 ° °Chronic Pain Problems: °Organization         Address  Phone   Notes  °[facility]  (336) 297-2271 Patients need to be referred by their primary care doctor.  ° °Medication Assistance: °Organization         Address  Phone   Notes  °[place] [facility] [address] °[place], [place] [address] (336) 641-8030 --Must be a resident of [place] °-- Must have NO insurance coverage whatsoever (no Medicaid/ Medicare, etc.) °-- The pt. MUST have a primary care doctor that directs their care regularly and follows them in the community °  °[facility]  (866) 331-1348   °[employer]  (888) 892-1162   ° °Agencies that provide inexpensive medical care: °Organization         Address  Phone   Notes  °[facility]  (336) 832-8035   °[facility]    (336) 832-7272   °[facility] [address] °[place], [place] [address] (336) 832-4777   °[facility] [address], °[place] (336) 271-4999   °[facility]    (336) 373-0678   °[facility]    (336) 272-1050   °[facility] ° [address], [place] Phone:  (336) 832-4444, Fax:  (336) 832-4440 Hours of Operation:  9 am - 6 pm, M-F.  Also accepts Medicaid/Medicare and self-pay.  °[facility] ° [address], [place] Phone: (336) 832-3150, Fax: (336) 832-3151. Hours of Operation:  8:30  am - 5:30 pm, M-F.  Also accepts Medicaid and self-pay.  °[facility] [place] [address], [place] Phone: (336) 878-6027   °[facility] [address], [place], [place] (336)723-1848, Ext. 123 Mondays & Thursdays: 7-9 AM.  First 15 patients are seen on a first come, first serve basis. °  ° °Medicaid-accepting [place] Providers: ° °Organization         Address  Phone   Notes  °[facility] [address], [place] (336) 641-2100 Also accepts self-pay patients.  °[facility] [address], [place] ° (336) 856-9996   °[facility] [address], [place] (336) 288-8857   °[facility] [address] [place] [address], [place] (336) 299-7000   °Veita Bland [address], [place]  ° (336) 373-1557 Only accepts [place] Access Medicaid patients after they have their name applied to their card.  ° °Self-Pay (no insurance) in [place]: ° °Organization         Address  Phone   Notes  °Sickle Cell Patients, [facility] [address], [place] (336) 832-1970   °[facility] [address], [place] (336) 832-4400   °[facility] ° [address], [place] (336) 992-4800   °[facility]/Dr. Osei-Bonsu ° [address] [place] [address], [place] or [address], [place] (336) 841-8500 Phone number for both [place] and [place] locations is the same.  °[facility] [address], [place] (336) 299-0000   °[facility] [address] [place] [address], [place] or [address] (336) 852-7530 °(336) 878-2260   °[facility] [address], [place] (336) 350-1642, phone; (336) 294-5005, fax Sees patients 1st and 3rd Saturday of every month.  Must not qualify for public or private insurance (i.e. Medicaid, Medicare, [place] Health  Choice, Veterans' Benefits) • Household income should be no more than 200% of the poverty level •The clinic cannot treat you if you are pregnant or think you are pregnant • Sexually transmitted diseases are not treated at the clinic.  ° ° °Dental Care: °Organization         Address  Phone  Notes  °[facility] [address],  (336) 641-6152 Accepts children up to age 21 who are enrolled in Medicaid or [place] Health Choice; pregnant women with a Medicaid card; and children who have applied for Medicaid or  Health Choice, but were declined, whose parents can pay a reduced fee at time of service.  °[facility] [place]  [address], [place] (336) 641-7733 Accepts children up to age 21 who are enrolled in Medicaid or    Health Choice; pregnant women with a Medicaid card; and children who have applied for Medicaid or [place] Health Choice, but were declined, whose parents can pay a reduced fee at time of service.  °[facility] ° [address], [place] (336) 641-4533 Patients are seen by appointment only. Walk-ins are not accepted. [facility] will see patients 18 years of age and older. °Monday - Tuesday (8am-5pm) °Most Wednesdays (8:30-5pm) °$30 per visit, cash only  °[facility] ° [address], [place] (336) 641-4533 Patients are seen by appointment only. Walk-ins are not accepted. [facility] will see patients 18 years of age and older. °One Wednesday Evening (Monthly: Volunteer Based).  $30 per visit, cash only  °[facility]  (919) 537-3737 for adults; Children under age 4, call Graduate Pediatric Dentistry at ([phone]) [phone]. Children aged 4-14, please call (919) [phone] to request a pediatric application. ° Dental services are provided in all areas of dental care including fillings, crowns and bridges, complete  and partial dentures, implants, gum treatment, root canals, and extractions. Preventive care is also provided. Treatment is provided to both adults and children. °Patients are selected via a lottery and there is often a waiting list. °  °[facility] [address], °[place] ° (336) 763-8833 [email or web address] °  °[facility] [address], [place], [place] (336)723-1848, Ext. 123 Second and Fourth Thursday of each month, opens at 6:30 AM; Clinic ends at 9 AM.  Patients are seen on a first-come first-served basis, and a limited number are seen during each clinic.  ° °[facility] ° [address], [place], [place] (336) 723-7904   Eligibility Requirements °You must have lived in [place], [place], or [place] counties for at least the last three months. °  You cannot be eligible for state or federal sponsored healthcare insurance, including Veterans Administration, Medicaid, or Medicare. °  You generally cannot be eligible for healthcare insurance through your employer.  °  How to apply: °Eligibility screenings are held every Tuesday and Wednesday afternoon from 1:00 pm until 4:00 pm. You do not need an appointment for the interview!  °[facility] [address], [place], [place] 336-631-2330   °[facility] [facility]  336-342-8273   °[place]  336-703-3100   ° [facility]  336-570-6415   ° °Behavioral Health Resources in the Community: °Intensive Outpatient Programs °Organization         Address  Phone  Notes  °[facility] [address], [place], [place] 336-878-6098   °[facility] [address], [place], [place] 336-832-9800   °[facility] [address], [place], [place] ° 336-882-2125   °[facility] [address],  °[place], [place] 1-800-853-5163 or 336-641-4981   °Substance Abuse Resources °Organization          Address  Phone  Notes  °[facility]  336-882-2125   °[facility]  336-784-9470   °[facility]  336-285-9073   °[facility]  336-845-3988   °Residential & Outpatient Substance Abuse Program  1-800-659-3381   °Psychological Services °Organization         Address  Phone  Notes  °[facility]  336- 832-9600   °[facility]  336- 378-7881   °[facility] [address], [place] 1-800-853-5163 or 336-641-4981   ° °  Mobile Crisis Teams °Organization         Address  Phone  Notes  °[facility], Mobile Crisis Care Unit  1-877-626-1772   °Assertive °[facility] ° [address] [place], [place] 336-834-9664   °Sharon DeEsch [address] °[place] 336-554-5454   ° °Self-Help/Support Groups °Organization         Address  Phone             Notes  °[facility] [place] - variety of support groups  336- 373-1402 Call for more information  °Narcotics Anonymous (NA), [facility] [address], °[place]  2 meetings at this location  ° °Residential Treatment Programs °Organization         Address  Phone  Notes  °[facility] [address],    °[place]  1-866-801-8205   °[facility] ° [address], [place], [place] 704-293-8524   °[facility] [address], [place] 336-845-3988 Admissions: 8am-3pm M-F  °[facility] [address]    °[place], [place] 336-841-1104   °[facility] [address], [place], [place] 336-379-7146   °[facility] [address]  °[place], [place] 336-285-9073   °[facility] Intensive Outpatient [address], [place], [place] 336-852-3033   °[facility] [address]  °[place], [place] 1-877-615-2722 or 336-784-9470   °[facility] [address] [place], [place]  336-227-7417 Accepts Medicaid  °[facility] [address]  °[place] 1-800-659-3381 Substance Abuse/Addiction Treatment  ° °[place] Behavioral Health Resources °Organization         Address  Phone  Notes  °[facility]  (888) 581-9988   °Julie Brannon, PhD [address] [place], [place]   (336) 349-5553 or (336) 951-0000   °[facility]   [address] °[place], [place] (336) 349-4454   °[facility] [address], [place], [place] (336) 342-8316 Insurance/Medicaid/sponsorship through [facility]  °[facility] [address]                                    [place], [place] (336) 342-8316 Therapy/tele-psych/case  °[facility] [address]  ° [place], [place] (336) 349-2233    °Dr. Arfeen  (336) 349-4544   °[facility] [place]  [employer] [place] [facility] 1) [address], [place] °2) [address], [place] °3)  [address], [place] (336) 349-3220 °(336) 342-7768 ° °(336) 342-8140   °[place] [facility] (336) 342-1394 or (336) 342-3537 (After Hours)    ° ° ° ° °

## 2015-05-14 NOTE — Progress Notes (Signed)
Subjective:  VSS.  Sodium improved.  He has been walking the halls this AM.  Denies any CP.  Reports some mild SOB but also has h/o asthma and uses inhalers.  O2Sat wnl.   Objective: Vital signs in last 24 hours: Filed Vitals:   05/13/15 1624 05/13/15 2035 05/13/15 2100 05/14/15 0429  BP: 133/82  127/63 138/79  Pulse: 63  59 53  Temp: 98.5 F (36.9 C)  98.3 F (36.8 C) 98.1 F (36.7 C)  TempSrc: Oral     Resp: 18  17 18   Height:      Weight:   73.846 kg (162 lb 12.8 oz)   SpO2: 100% 98% 99% 100%   Weight change: -0.998 kg (-2 lb 3.2 oz)  Intake/Output Summary (Last 24 hours) at 05/14/15 0751 Last data filed at 05/14/15 0429  Gross per 24 hour  Intake   1080 ml  Output   2300 ml  Net  -1220 ml   General: African-American male, resting in bed, NAD HEENT: PERRL, EOMI, no scleral icterus, oropharynx clear Cardiac: RRR, no rubs, murmurs or gallops Pulm: clear to auscultation bilaterally, no wheezes, rales, or rhonchi Abd: soft, nontender, nondistended, BS present Ext: warm and well perfused, no pedal edema Neuro: responds to questions appropriately; moving all extremities freely  Lab Results: Basic Metabolic Panel:  Recent Labs Lab 05/13/15 2021 05/14/15 0541  NA 127* 128*  K 4.4 4.1  CL 96* 100*  CO2 22 22  GLUCOSE 81 144*  BUN 19 17  CREATININE 1.27* 1.30*  CALCIUM 9.2 8.8*   Liver Function Tests:  Recent Labs Lab 05/12/15 1742  AST 42*  ALT 29  ALKPHOS 78  BILITOT 0.5  PROT 6.2*  ALBUMIN 3.7    Recent Labs Lab 05/12/15 1742  LIPASE 13*   CBC:  Recent Labs Lab 05/12/15 1742  WBC 4.9  NEUTROABS 2.5  HGB 16.0  HCT 40.8  MCV 74.2*  PLT 172   Cardiac Enzymes:  Recent Labs Lab 05/12/15 1742 05/12/15 2302 05/13/15 0549  TROPONINI 0.04* 0.03 0.04*   CBG:  Recent Labs Lab 05/12/15 2205 05/13/15 0729 05/13/15 1145 05/13/15 1627 05/13/15 2058  GLUCAP 283* 137* 118* 215* 78   Urine Drug Screen: Drugs of Abuse       Component Value Date/Time   LABOPIA NONE DETECTED 05/12/2015 2040   COCAINSCRNUR POSITIVE* 05/12/2015 2040   LABBENZ NONE DETECTED 05/12/2015 2040   AMPHETMU NONE DETECTED 05/12/2015 2040   THCU NONE DETECTED 05/12/2015 2040   LABBARB NONE DETECTED 05/12/2015 2040    Alcohol Level:  Recent Labs Lab 05/12/15 1742  ETH 11*   Urinalysis:  Recent Labs Lab 05/12/15 1817  COLORURINE YELLOW  LABSPEC 1.012  PHURINE 6.5  GLUCOSEU >1000*  HGBUR NEGATIVE  BILIRUBINUR NEGATIVE  KETONESUR NEGATIVE  PROTEINUR NEGATIVE  UROBILINOGEN 0.2  NITRITE NEGATIVE  LEUKOCYTESUR NEGATIVE   Studies/Results: Dg Chest 2 View  05/12/2015   CLINICAL DATA:  Chest tightness and shortness of breath for 2 days  EXAM: CHEST  2 VIEW  COMPARISON:  Apr 08, 2014  FINDINGS: There is no edema or consolidation. The heart size and pulmonary vascularity are normal. No pneumothorax. No adenopathy. No bone lesions. There is a rudimentary cervical rib on the right.  IMPRESSION: No edema or consolidation.   Electronically Signed   By: Bretta Bang III M.D.   On: 05/12/2015 17:59   Medications: I have reviewed the patient's current medications. Scheduled Meds: . aspirin  81 mg  Oral Daily  . brimonidine  1 drop Both Eyes TID  . budesonide-formoterol  2 puff Inhalation BID  . enoxaparin (LOVENOX) injection  40 mg Subcutaneous Q24H  . feeding supplement (ENSURE ENLIVE)  237 mL Oral BID BM  . folic acid  1 mg Oral Daily  . insulin aspart  0-15 Units Subcutaneous TID WC  . insulin glargine  15 Units Subcutaneous QHS  . latanoprost  1 drop Both Eyes QHS  . multivitamin with minerals  1 tablet Oral Daily  . sodium chloride  3 mL Intravenous Q12H  . thiamine  100 mg Oral Daily   Or  . thiamine  100 mg Intravenous Daily   Continuous Infusions: . sodium chloride 75 mL/hr at 05/13/15 2309   PRN Meds:.albuterol, LORazepam **OR** LORazepam, ondansetron **OR** ondansetron (ZOFRAN) IV,  traZODone Assessment/Plan:  Hyponatremia:  Given low urine osmolality and normal urine sodium, beer potomania suggestive. Ongoing diarrhea may be contributory along with his HCTZ use at home.  Has been on gentle IVF overnight.  Sodium this AM 128.   -d/c HCTZ  -stable to d/c today   Polysubstance abuse:  Drank six beers and 3 shots yesterday and two beers today along with cocaine use yesterday. He averages 2 beers a day and denies any previous history of withdrawal symptoms or seizures. No CIWA score started overnight. HIV reassuring. UDS consistent with cocaine use.  -Continue zofran 4 mg q6h po or iv -Continue CIWA protocol -Continue multivitamin, folate, thiamine   Atypical chest pain with history of CAD:  Resolved by the time of exam and likely in the setting of vomiting though cannot exclude coronary bouts of spasm given his self-report cocaine use and prior CAD [s/p PCI to RCA 2010 and PCI LAD 2011]. Telemetry findings and troponins otherwise reassuring overnight. EKG findings otherwise stable. -Continue ASA 81 mg daily -Discontinue telemetry  DM2:  HA1c in our EMR 8.3 on 08/2012. At home he is on lantus 25 u qhs. -Continue lantus 15 u qhs -Continue SSI sensitive  HTN: -Holding HCTZ as noted above  CKD: Creatinine 1.3 most recent BMET, stable from admission with baseline ~1.5 -As noted above with BMET's  Asthma: Home medications include symbicort 2 puff bid, albuterol 2 puff q6hprn] -Continue home medications  Hyperlipidemia: Last lipid profile 08/2012 cholesterol 129, LDL 72, HDL 37, triglyceride 98. No statin on EMR. -Recommend starting statin to PCP  FEN:  -Diet: Heart healthy  DVT prophylaxis: Lovenox  CODE STATUS: FULL CODE  Dispo: Discharge today.   The patient does have a current PCP (No Pcp Per Patient) and does not need an [facility] hospital follow-up appointment after discharge.     Marrian Salvage, MD 05/14/2015, 7:51 AM

## 2015-05-18 ENCOUNTER — Encounter: Payer: Self-pay | Admitting: Internal Medicine

## 2015-06-24 ENCOUNTER — Encounter (HOSPITAL_COMMUNITY): Payer: Self-pay | Admitting: Emergency Medicine

## 2015-06-24 ENCOUNTER — Emergency Department (HOSPITAL_COMMUNITY): Payer: BLUE CROSS/BLUE SHIELD

## 2015-06-24 ENCOUNTER — Inpatient Hospital Stay (HOSPITAL_COMMUNITY)
Admission: EM | Admit: 2015-06-24 | Discharge: 2015-06-27 | DRG: 641 | Disposition: A | Discharge status: Home / Self Care | Payer: BLUE CROSS/BLUE SHIELD | Attending: Internal Medicine | Admitting: Internal Medicine

## 2015-06-24 DIAGNOSIS — N179 Acute kidney failure, unspecified: Secondary | ICD-10-CM | POA: Diagnosis present

## 2015-06-24 DIAGNOSIS — K219 Gastro-esophageal reflux disease without esophagitis: Secondary | ICD-10-CM | POA: Diagnosis present

## 2015-06-24 DIAGNOSIS — F1020 Alcohol dependence, uncomplicated: Secondary | ICD-10-CM | POA: Diagnosis present

## 2015-06-24 DIAGNOSIS — R0789 Other chest pain: Secondary | ICD-10-CM | POA: Diagnosis present

## 2015-06-24 DIAGNOSIS — E1122 Type 2 diabetes mellitus with diabetic chronic kidney disease: Secondary | ICD-10-CM | POA: Diagnosis present

## 2015-06-24 DIAGNOSIS — F329 Major depressive disorder, single episode, unspecified: Secondary | ICD-10-CM | POA: Diagnosis present

## 2015-06-24 DIAGNOSIS — Z79899 Other long term (current) drug therapy: Secondary | ICD-10-CM

## 2015-06-24 DIAGNOSIS — N400 Enlarged prostate without lower urinary tract symptoms: Secondary | ICD-10-CM | POA: Diagnosis present

## 2015-06-24 DIAGNOSIS — I2510 Atherosclerotic heart disease of native coronary artery without angina pectoris: Secondary | ICD-10-CM | POA: Diagnosis present

## 2015-06-24 DIAGNOSIS — Z794 Long term (current) use of insulin: Secondary | ICD-10-CM

## 2015-06-24 DIAGNOSIS — I129 Hypertensive chronic kidney disease with stage 1 through stage 4 chronic kidney disease, or unspecified chronic kidney disease: Secondary | ICD-10-CM | POA: Diagnosis present

## 2015-06-24 DIAGNOSIS — E785 Hyperlipidemia, unspecified: Secondary | ICD-10-CM | POA: Diagnosis present

## 2015-06-24 DIAGNOSIS — Z7951 Long term (current) use of inhaled steroids: Secondary | ICD-10-CM

## 2015-06-24 DIAGNOSIS — Z87891 Personal history of nicotine dependence: Secondary | ICD-10-CM

## 2015-06-24 DIAGNOSIS — T405X1A Poisoning by cocaine, accidental (unintentional), initial encounter: Secondary | ICD-10-CM | POA: Diagnosis present

## 2015-06-24 DIAGNOSIS — I10 Essential (primary) hypertension: Secondary | ICD-10-CM | POA: Diagnosis present

## 2015-06-24 DIAGNOSIS — J45909 Unspecified asthma, uncomplicated: Secondary | ICD-10-CM | POA: Diagnosis present

## 2015-06-24 DIAGNOSIS — E1159 Type 2 diabetes mellitus with other circulatory complications: Secondary | ICD-10-CM | POA: Diagnosis present

## 2015-06-24 DIAGNOSIS — E860 Dehydration: Secondary | ICD-10-CM | POA: Diagnosis present

## 2015-06-24 DIAGNOSIS — F1010 Alcohol abuse, uncomplicated: Secondary | ICD-10-CM | POA: Insufficient documentation

## 2015-06-24 DIAGNOSIS — F1410 Cocaine abuse, uncomplicated: Secondary | ICD-10-CM | POA: Diagnosis present

## 2015-06-24 DIAGNOSIS — E871 Hypo-osmolality and hyponatremia: Principal | ICD-10-CM | POA: Diagnosis present

## 2015-06-24 DIAGNOSIS — Z955 Presence of coronary angioplasty implant and graft: Secondary | ICD-10-CM

## 2015-06-24 DIAGNOSIS — N183 Chronic kidney disease, stage 3 (moderate): Secondary | ICD-10-CM | POA: Diagnosis present

## 2015-06-24 DIAGNOSIS — H409 Unspecified glaucoma: Secondary | ICD-10-CM | POA: Diagnosis present

## 2015-06-24 DIAGNOSIS — I252 Old myocardial infarction: Secondary | ICD-10-CM

## 2015-06-24 DIAGNOSIS — J449 Chronic obstructive pulmonary disease, unspecified: Secondary | ICD-10-CM | POA: Diagnosis present

## 2015-06-24 DIAGNOSIS — Z7982 Long term (current) use of aspirin: Secondary | ICD-10-CM

## 2015-06-24 LAB — HEPATIC FUNCTION PANEL
ALK PHOS: 74 U/L (ref 38–126)
ALT: 35 U/L (ref 17–63)
AST: 42 U/L — AB (ref 15–41)
Albumin: 3.8 g/dL (ref 3.5–5.0)
Bilirubin, Direct: 0.1 mg/dL (ref 0.1–0.5)
Indirect Bilirubin: 0.6 mg/dL (ref 0.3–0.9)
Total Bilirubin: 0.7 mg/dL (ref 0.3–1.2)
Total Protein: 6.8 g/dL (ref 6.5–8.1)

## 2015-06-24 LAB — URINALYSIS, ROUTINE W REFLEX MICROSCOPIC
BILIRUBIN URINE: NEGATIVE
Glucose, UA: 100 mg/dL — AB
KETONES UR: 15 mg/dL — AB
LEUKOCYTES UA: NEGATIVE
NITRITE: NEGATIVE
Protein, ur: NEGATIVE mg/dL
Specific Gravity, Urine: 1.007 (ref 1.005–1.030)
UROBILINOGEN UA: 0.2 mg/dL (ref 0.0–1.0)
pH: 5.5 (ref 5.0–8.0)

## 2015-06-24 LAB — CBC WITH DIFFERENTIAL/PLATELET
BASOS ABS: 0 10*3/uL (ref 0.0–0.1)
BASOS PCT: 0 % (ref 0–1)
EOS PCT: 2 % (ref 0–5)
Eosinophils Absolute: 0.1 10*3/uL (ref 0.0–0.7)
HCT: 35.5 % — ABNORMAL LOW (ref 39.0–52.0)
HEMOGLOBIN: 13.5 g/dL (ref 13.0–17.0)
Lymphocytes Relative: 34 % (ref 12–46)
Lymphs Abs: 2.1 10*3/uL (ref 0.7–4.0)
MCH: 28.1 pg (ref 26.0–34.0)
MCHC: 38 g/dL — AB (ref 30.0–36.0)
MCV: 74 fL — ABNORMAL LOW (ref 78.0–100.0)
Monocytes Absolute: 0.4 10*3/uL (ref 0.1–1.0)
Monocytes Relative: 7 % (ref 3–12)
Neutro Abs: 3.5 10*3/uL (ref 1.7–7.7)
Neutrophils Relative %: 57 % (ref 43–77)
Platelets: 160 10*3/uL (ref 150–400)
RBC: 4.8 MIL/uL (ref 4.22–5.81)
RDW: 12.9 % (ref 11.5–15.5)
WBC: 6.1 10*3/uL (ref 4.0–10.5)

## 2015-06-24 LAB — ETHANOL: ALCOHOL ETHYL (B): 45 mg/dL — AB (ref <5)

## 2015-06-24 LAB — RAPID URINE DRUG SCREEN, HOSP PERFORMED
AMPHETAMINES: NOT DETECTED
BENZODIAZEPINES: NOT DETECTED
Barbiturates: NOT DETECTED
Cocaine: POSITIVE — AB
Opiates: NOT DETECTED
TETRAHYDROCANNABINOL: NOT DETECTED

## 2015-06-24 LAB — CBC
HEMATOCRIT: 37.9 % — AB (ref 39.0–52.0)
HEMOGLOBIN: 14.7 g/dL (ref 13.0–17.0)
MCH: 28.8 pg (ref 26.0–34.0)
MCHC: 38.8 g/dL — ABNORMAL HIGH (ref 30.0–36.0)
MCV: 74.2 fL — ABNORMAL LOW (ref 78.0–100.0)
PLATELETS: 174 10*3/uL (ref 150–400)
RBC: 5.11 MIL/uL (ref 4.22–5.81)
RDW: 12.9 % (ref 11.5–15.5)
WBC: 7.2 10*3/uL (ref 4.0–10.5)

## 2015-06-24 LAB — BASIC METABOLIC PANEL
ANION GAP: 11 (ref 5–15)
Anion gap: 16 — ABNORMAL HIGH (ref 5–15)
BUN: 15 mg/dL (ref 6–20)
BUN: 14 mg/dL (ref 6–20)
CALCIUM: 8.7 mg/dL — AB (ref 8.9–10.3)
CHLORIDE: 83 mmol/L — AB (ref 101–111)
CO2: 18 mmol/L — ABNORMAL LOW (ref 22–32)
CO2: 21 mmol/L — ABNORMAL LOW (ref 22–32)
Calcium: 9.3 mg/dL (ref 8.9–10.3)
Chloride: 88 mmol/L — ABNORMAL LOW (ref 101–111)
Creatinine, Ser: 1.33 mg/dL — ABNORMAL HIGH (ref 0.61–1.24)
Creatinine, Ser: 1.16 mg/dL (ref 0.61–1.24)
GFR calc non Af Amer: >60 mL/min (ref >60)
GFR, EST NON AFRICAN AMERICAN: 58 mL/min — AB (ref >60)
Glucose, Bld: 181 mg/dL — ABNORMAL HIGH (ref 65–99)
Glucose, Bld: 155 mg/dL — ABNORMAL HIGH (ref 65–99)
POTASSIUM: 4.3 mmol/L (ref 3.5–5.1)
Potassium: 3.7 mmol/L (ref 3.5–5.1)
SODIUM: 120 mmol/L — AB (ref 135–145)
Sodium: 117 mmol/L — CL (ref 135–145)

## 2015-06-24 LAB — I-STAT TROPONIN, ED: Troponin i, poc: 0 ng/mL (ref 0.00–0.08)

## 2015-06-24 MED ORDER — MAGNESIUM SULFATE 2 GM/50ML IV SOLN
2.0000 g | Freq: Once | INTRAVENOUS | Status: AC
  Administered 2015-06-25: 2 g via INTRAVENOUS
  Filled 2015-06-24: qty 50

## 2015-06-24 MED ORDER — ASPIRIN 81 MG PO CHEW
324.0000 mg | CHEWABLE_TABLET | Freq: Once | ORAL | Status: AC
  Administered 2015-06-24: 324 mg via ORAL
  Filled 2015-06-24: qty 4

## 2015-06-24 MED ORDER — LORAZEPAM 2 MG/ML IJ SOLN
0.0000 mg | Freq: Two times a day (BID) | INTRAMUSCULAR | Status: AC
  Filled 2015-06-24: qty 1

## 2015-06-24 MED ORDER — NITROGLYCERIN 0.4 MG SL SUBL
0.4000 mg | SUBLINGUAL_TABLET | SUBLINGUAL | Status: DC | PRN
Start: 2015-06-24 — End: 2015-06-27

## 2015-06-24 MED ORDER — VITAMIN B-1 100 MG PO TABS
100.0000 mg | ORAL_TABLET | Freq: Every day | ORAL | Status: DC
  Administered 2015-06-25 – 2015-06-27 (×4): 100 mg via ORAL
  Filled 2015-06-24 (×4): qty 1

## 2015-06-24 MED ORDER — LORAZEPAM 1 MG PO TABS
0.0000 mg | ORAL_TABLET | Freq: Two times a day (BID) | ORAL | Status: DC
Start: 2015-06-24 — End: 2015-06-27
  Filled 2015-06-24: qty 1

## 2015-06-24 MED ORDER — SODIUM CHLORIDE 0.9 % IV BOLUS (SEPSIS)
1000.0000 mL | Freq: Once | INTRAVENOUS | Status: AC
  Administered 2015-06-24: 1000 mL via INTRAVENOUS

## 2015-06-24 MED ORDER — ONDANSETRON HCL 4 MG/2ML IJ SOLN
4.0000 mg | Freq: Once | INTRAMUSCULAR | Status: AC
  Administered 2015-06-24: 4 mg via INTRAVENOUS
  Filled 2015-06-24: qty 2

## 2015-06-24 MED ORDER — LORAZEPAM 1 MG PO TABS
0.0000 mg | ORAL_TABLET | Freq: Four times a day (QID) | ORAL | Status: AC
  Administered 2015-06-24 – 2015-06-26 (×4): 1 mg via ORAL
  Filled 2015-06-24 (×3): qty 1

## 2015-06-24 MED ORDER — LORAZEPAM 2 MG/ML IJ SOLN
0.0000 mg | Freq: Four times a day (QID) | INTRAMUSCULAR | Status: AC
  Administered 2015-06-24: 2 mg via INTRAVENOUS

## 2015-06-24 MED ORDER — THIAMINE HCL 100 MG/ML IJ SOLN
100.0000 mg | Freq: Every day | INTRAMUSCULAR | Status: DC
  Administered 2015-06-25: 100 mg via INTRAVENOUS
  Filled 2015-06-24 (×3): qty 1

## 2015-06-24 NOTE — ED Notes (Signed)
abd pain better no longer vomiting

## 2015-06-24 NOTE — ED Notes (Signed)
Pt sleeping and voiding

## 2015-06-24 NOTE — ED Notes (Addendum)
Pt. reports intermittent central chest pain for 2 days with SOB , emesis and diaphoresis , history of CAD / coronary stent . Pt. stated he had Cocaine last night .

## 2015-06-24 NOTE — ED Provider Notes (Signed)
CSN: [id number]     Arrival date & time 06/24/15  1911 History   First MD Initiated Contact with Patient 06/24/15 2023     Chief Complaint  Patient presents with  . Chest Pain     (Consider location/radiation/quality/duration/timing/severity/associated sxs/prior Treatment) HPI Comments: Pt. reports intermittent central chest pain for 2 days with SOB , emesis and diaphoresis , history of CAD / coronary stent . Pt. stated he had Cocaine last night. He also endorses drinking 12 beers yesterday, intermittent history of ETOH use. Patient states this chest feels like his last MI 2013. Followed in the [place] no Cardiology visits since 2013.   Patient is a 54 y.o. male presenting with chest pain. The history is provided by the patient.  Chest Pain Pain location:  Substernal area Pain quality: pressure   Pain radiates to:  Does not radiate Pain radiates to the back: no   Pain severity:  Severe Onset quality:  Gradual Duration:  2 days Timing:  Intermittent Progression:  Waxing and waning Chronicity:  New Relieved by:  None tried Worsened by:  Nothing tried Ineffective treatments:  None tried Associated symptoms: fatigue, nausea, shortness of breath and vomiting   Associated symptoms: no lower extremity edema   Risk factors: coronary artery disease, diabetes mellitus, high cholesterol, hypertension and male sex     Past Medical History  Diagnosis Date  . Diabetes mellitus, type 2   . Hypertension   . Asthma   . Pancreatitis   . Coronary artery disease     s/p PCI RCA '10, s/p PCI LAD '11  . Depressed   . HLD (hyperlipidemia)   . CKD (chronic kidney disease)    Past Surgical History  Procedure Laterality Date  . Cardiac catheterization    . Coronary stent placement    . Left heart catheterization with coronary angiogram Bilateral 08/25/2012    Procedure: LEFT HEART CATHETERIZATION WITH CORONARY ANGIOGRAM;  Surgeon: Kathleene Hazel, MD;  Location: [facility] CATH LAB;  Service:  Cardiovascular;  Laterality: Bilateral;   Family History  Problem Relation Age of Onset  . Heart disease Mother     MI 28s   History  Substance Use Topics  . Smoking status: Former Smoker    Types: Cigarettes    Quit date: 11/25/2007  . Smokeless tobacco: Never Used  . Alcohol Use: Yes     Comment: use to drink "heavy", quit 2009. 06/24/15 12 pack of beer daily.    Review of Systems  Constitutional: Positive for fatigue.  Respiratory: Positive for shortness of breath.   Cardiovascular: Positive for chest pain.  Gastrointestinal: Positive for nausea and vomiting.  All other systems reviewed and are negative.     Allergies  Review of patient's allergies indicates no known allergies.  Home Medications   Prior to Admission medications   Medication Sig Start Date End Date Taking? Authorizing Provider  albuterol (PROVENTIL HFA;VENTOLIN HFA) 108 (90 BASE) MCG/ACT inhaler Inhale 2 puffs into the lungs every 6 (six) hours as needed for wheezing or shortness of breath.    Yes Historical Provider, MD  aspirin EC 81 MG tablet Take 81 mg by mouth daily.   Yes Historical Provider, MD  brimonidine (ALPHAGAN) 0.2 % ophthalmic solution Place 1 drop into both eyes daily.  04/11/15  Yes Historical Provider, MD  budesonide-formoterol (SYMBICORT) 160-4.5 MCG/ACT inhaler Inhale 2 puffs into the lungs 2 (two) times daily.   Yes Historical Provider, MD  insulin glargine (LANTUS) 100 UNIT/ML injection Inject 25  Units into the skin at bedtime.    Yes Historical Provider, MD  latanoprost (XALATAN) 0.005 % ophthalmic solution Place 1 drop into both eyes at bedtime.  04/11/15  Yes Historical Provider, MD  Multiple Vitamin (MULTIVITAMIN WITH MINERALS) TABS tablet Take 1 tablet by mouth daily.   Yes Historical Provider, MD  Omega-3 Fatty Acids (FISH OIL) 1000 MG CAPS Take 1,000 mg by mouth daily.    Yes Historical Provider, MD  traZODone (DESYREL) 100 MG tablet Take 100 mg by mouth at bedtime.   Yes  Historical Provider, MD  HYDROcodone-acetaminophen (NORCO/VICODIN) 5-325 MG per tablet Take 1-2 tablets every 6 hours as needed for severe pain Patient not taking: Reported on 06/24/2015 12/24/14   Renne Crigler, PA-C  methocarbamol (ROBAXIN) 500 MG tablet Take 2 tablets (1,000 mg total) by mouth 4 (four) times daily. Patient not taking: Reported on 06/24/2015 12/24/14   Renne Crigler, PA-C   BP 161/79 mmHg  Pulse 64  Temp(Src) 97.9 F (36.6 C) (Oral)  Resp 18  SpO2 100% Physical Exam  Constitutional: He is oriented to person, place, and time. He appears well-developed and well-nourished.  HENT:  Head: Normocephalic and atraumatic.  Eyes: Conjunctivae are normal.  Neck: Neck supple.  Cardiovascular: Normal rate, regular rhythm and normal heart sounds.   Pulmonary/Chest: Effort normal and breath sounds normal.  Abdominal: Soft. There is no tenderness.  Musculoskeletal: Normal range of motion. He exhibits no edema.  Neurological: He is alert and oriented to person, place, and time.  Skin: Skin is warm and dry.  Nursing note and vitals reviewed.   ED Course  Procedures (including critical care time) Medications  nitroGLYCERIN (NITROSTAT) SL tablet 0.4 mg (not administered)  LORazepam (ATIVAN) tablet 0-4 mg (1 mg Oral Given 06/24/15 2100)  LORazepam (ATIVAN) tablet 0-4 mg (not administered)  LORazepam (ATIVAN) injection 0-4 mg (2 mg Intravenous Given 06/24/15 2158)  LORazepam (ATIVAN) injection 0-4 mg (not administered)  thiamine (VITAMIN B-1) tablet 100 mg (not administered)  thiamine (B-1) injection 100 mg (not administered)  magnesium sulfate IVPB 2 g 50 mL (not administered)  sodium chloride 0.9 % bolus 1,000 mL (0 mLs Intravenous Stopped 06/24/15 2229)  aspirin chewable tablet 324 mg (324 mg Oral Given 06/24/15 2103)  ondansetron (ZOFRAN) injection 4 mg (4 mg Intravenous Given 06/24/15 2059)  sodium chloride 0.9 % bolus 1,000 mL (0 mLs Intravenous Stopped 06/24/15 2343)    Labs  Review Labs Reviewed  BASIC METABOLIC PANEL - Abnormal; Notable for the following:    Sodium 117 (*)    Chloride 83 (*)    CO2 18 (*)    Glucose, Bld 181 (*)    Creatinine, Ser 1.33 (*)    GFR calc non Af Amer 58 (*)    Anion gap 16 (*)    All other components within normal limits  CBC - Abnormal; Notable for the following:    HCT 37.9 (*)    MCV 74.2 (*)    MCHC 38.8 (*)    All other components within normal limits  URINALYSIS, ROUTINE W REFLEX MICROSCOPIC (NOT AT [facility]) - Abnormal; Notable for the following:    Glucose, UA 100 (*)    Hgb urine dipstick TRACE (*)    Ketones, ur 15 (*)    All other components within normal limits  ETHANOL - Abnormal; Notable for the following:    Alcohol, Ethyl (B) 45 (*)    All other components within normal limits  URINE RAPID DRUG SCREEN, HOSP PERFORMED -  Abnormal; Notable for the following:    Cocaine POSITIVE (*)    All other components within normal limits  HEPATIC FUNCTION PANEL - Abnormal; Notable for the following:    AST 42 (*)    All other components within normal limits  CBC WITH DIFFERENTIAL/PLATELET - Abnormal; Notable for the following:    HCT 35.5 (*)    MCV 74.0 (*)    MCHC 38.0 (*)    All other components within normal limits  BASIC METABOLIC PANEL - Abnormal; Notable for the following:    Sodium 120 (*)    Chloride 88 (*)    CO2 21 (*)    Glucose, Bld 155 (*)    Calcium 8.7 (*)    All other components within normal limits  URINE MICROSCOPIC-ADD ON  MAGNESIUM  PHOSPHORUS  I-STAT TROPOININ, ED  I-STAT TROPOININ, ED    Imaging Review Dg Chest 2 View  06/24/2015   CLINICAL DATA:  Intermittent central chest pain 2 days with shortness of breath, vomiting and diaphoresis. States cocaine use last night.  EXAM: CHEST  2 VIEW  COMPARISON:  05/12/2015  FINDINGS: Lungs are clear. Cardiomediastinal silhouette is within normal. Prominent right paravertebral osteophyte over the mid thoracic spine.  IMPRESSION: No active  cardiopulmonary disease.   Electronically Signed   By: Elberta Fortis M.D.   On: 06/24/2015 20:01     EKG Interpretation None      MDM   Final diagnoses:  Hyponatremia    Filed Vitals:   06/24/15 2330  BP: 161/79  Pulse: 64  Temp:   Resp:    I have reviewed nursing notes, vital signs, and all lab and all imaging results as noted above.  1) CP: troponin negative, EKG unremarkable. CXR unremarkable. Likely vasospasm induced from cocaine use.  2) Hyponatremia: Patient with Na of 117, gentle rehydrate given with minimal improvement to 120. No edema noted on exam. No mental status changes.   Will admit patient to hospitalist for further management and evaluation. Patient d/w with Dr. Loretha Stapler, agrees with plan.      Francee Piccolo, PA-C 06/25/15 0038  Blake Divine, MD 06/26/15 [phone]

## 2015-06-24 NOTE — ED Notes (Signed)
Cbc tube clotted needs redraw

## 2015-06-24 NOTE — ED Notes (Signed)
Vomited back his po ativan  Given 1 mg iv

## 2015-06-24 NOTE — ED Notes (Signed)
Pt c/o chest and abd pain for 2 days.  With nausea.   He has also been drinking alcohol

## 2015-06-24 NOTE — ED Notes (Signed)
RN received report of low sodium of 117. RN reported critical lab to [facility] MD and pt.'s RN

## 2015-06-25 DIAGNOSIS — E871 Hypo-osmolality and hyponatremia: Secondary | ICD-10-CM | POA: Diagnosis not present

## 2015-06-25 DIAGNOSIS — F1010 Alcohol abuse, uncomplicated: Secondary | ICD-10-CM | POA: Insufficient documentation

## 2015-06-25 LAB — COMPREHENSIVE METABOLIC PANEL
ALT: 32 U/L (ref 17–63)
AST: 38 U/L (ref 15–41)
Albumin: 3.5 g/dL (ref 3.5–5.0)
Alkaline Phosphatase: 68 U/L (ref 38–126)
Anion gap: 7 (ref 5–15)
BUN: 13 mg/dL (ref 6–20)
CO2: 20 mmol/L — ABNORMAL LOW (ref 22–32)
Calcium: 8.7 mg/dL — ABNORMAL LOW (ref 8.9–10.3)
Chloride: 96 mmol/L — ABNORMAL LOW (ref 101–111)
Creatinine, Ser: 1.22 mg/dL (ref 0.61–1.24)
GFR calc Af Amer: >60 mL/min (ref >60)
GFR calc non Af Amer: >60 mL/min (ref >60)
Glucose, Bld: 119 mg/dL — ABNORMAL HIGH (ref 65–99)
Potassium: 4 mmol/L (ref 3.5–5.1)
Sodium: 123 mmol/L — ABNORMAL LOW (ref 135–145)
Total Bilirubin: 0.7 mg/dL (ref 0.3–1.2)
Total Protein: 5.8 g/dL — ABNORMAL LOW (ref 6.5–8.1)

## 2015-06-25 LAB — BASIC METABOLIC PANEL
ANION GAP: 8 (ref 5–15)
Anion gap: 6 (ref 5–15)
BUN: 16 mg/dL (ref 6–20)
BUN: 16 mg/dL (ref 6–20)
CALCIUM: 8.6 mg/dL — AB (ref 8.9–10.3)
CO2: 21 mmol/L — ABNORMAL LOW (ref 22–32)
CO2: 23 mmol/L (ref 22–32)
CREATININE: 1.45 mg/dL — AB (ref 0.61–1.24)
Calcium: 8.9 mg/dL (ref 8.9–10.3)
Chloride: 94 mmol/L — ABNORMAL LOW (ref 101–111)
Chloride: 96 mmol/L — ABNORMAL LOW (ref 101–111)
Creatinine, Ser: 1.6 mg/dL — ABNORMAL HIGH (ref 0.61–1.24)
GFR calc Af Amer: 54 mL/min — ABNORMAL LOW (ref >60)
GFR calc non Af Amer: 52 mL/min — ABNORMAL LOW (ref >60)
GFR calc non Af Amer: 47 mL/min — ABNORMAL LOW (ref >60)
Glucose, Bld: 295 mg/dL — ABNORMAL HIGH (ref 65–99)
Glucose, Bld: 159 mg/dL — ABNORMAL HIGH (ref 65–99)
POTASSIUM: 4.6 mmol/L (ref 3.5–5.1)
Potassium: 4.7 mmol/L (ref 3.5–5.1)
SODIUM: 125 mmol/L — AB (ref 135–145)
Sodium: 123 mmol/L — ABNORMAL LOW (ref 135–145)

## 2015-06-25 LAB — PHOSPHORUS: Phosphorus: 3.4 mg/dL (ref 2.5–4.6)

## 2015-06-25 LAB — I-STAT TROPONIN, ED: TROPONIN I, POC: 0.01 ng/mL (ref 0.00–0.08)

## 2015-06-25 LAB — GLUCOSE, CAPILLARY
GLUCOSE-CAPILLARY: 114 mg/dL — AB (ref 65–99)
GLUCOSE-CAPILLARY: 128 mg/dL — AB (ref 65–99)
GLUCOSE-CAPILLARY: 156 mg/dL — AB (ref 65–99)
GLUCOSE-CAPILLARY: 218 mg/dL — AB (ref 65–99)
Glucose-Capillary: 248 mg/dL — ABNORMAL HIGH (ref 65–99)

## 2015-06-25 LAB — MRSA PCR SCREENING: MRSA by PCR: NEGATIVE

## 2015-06-25 LAB — MAGNESIUM: MAGNESIUM: 2.1 mg/dL (ref 1.7–2.4)

## 2015-06-25 MED ORDER — FOLIC ACID 1 MG PO TABS
1.0000 mg | ORAL_TABLET | Freq: Every day | ORAL | Status: DC
  Administered 2015-06-25 – 2015-06-27 (×3): 1 mg via ORAL
  Filled 2015-06-25 (×3): qty 1

## 2015-06-25 MED ORDER — ENOXAPARIN SODIUM 40 MG/0.4ML ~~LOC~~ SOLN
40.0000 mg | SUBCUTANEOUS | Status: DC
  Administered 2015-06-25 – 2015-06-27 (×3): 40 mg via SUBCUTANEOUS
  Filled 2015-06-25 (×3): qty 0.4

## 2015-06-25 MED ORDER — ENOXAPARIN SODIUM 40 MG/0.4ML ~~LOC~~ SOLN: 40.0000 mg | SUBCUTANEOUS | Status: DC

## 2015-06-25 MED ORDER — CETYLPYRIDINIUM CHLORIDE 0.05 % MT LIQD
7.0000 mL | Freq: Two times a day (BID) | OROMUCOSAL | Status: DC
  Administered 2015-06-25 – 2015-06-27 (×5): 7 mL via OROMUCOSAL

## 2015-06-25 MED ORDER — LATANOPROST 0.005 % OP SOLN
1.0000 [drp] | Freq: Every day | OPHTHALMIC | Status: DC
  Administered 2015-06-25 – 2015-06-26 (×3): 1 [drp] via OPHTHALMIC
  Filled 2015-06-25: qty 2.5

## 2015-06-25 MED ORDER — ONDANSETRON HCL 4 MG PO TABS
4.0000 mg | ORAL_TABLET | Freq: Four times a day (QID) | ORAL | Status: DC | PRN
Start: 2015-06-25 — End: 2015-06-27

## 2015-06-25 MED ORDER — ALBUTEROL SULFATE (2.5 MG/3ML) 0.083% IN NEBU: 3.0000 mL | INHALATION_SOLUTION | Freq: Four times a day (QID) | RESPIRATORY_TRACT | Status: DC | PRN

## 2015-06-25 MED ORDER — ONDANSETRON HCL 4 MG/2ML IJ SOLN: 4.0000 mg | Freq: Four times a day (QID) | INTRAMUSCULAR | Status: DC | PRN

## 2015-06-25 MED ORDER — BUDESONIDE-FORMOTEROL FUMARATE 160-4.5 MCG/ACT IN AERO
2.0000 | INHALATION_SPRAY | Freq: Two times a day (BID) | RESPIRATORY_TRACT | Status: DC
  Administered 2015-06-25 – 2015-06-27 (×5): 2 via RESPIRATORY_TRACT
  Filled 2015-06-25: qty 6

## 2015-06-25 MED ORDER — BRIMONIDINE TARTRATE 0.2 % OP SOLN
1.0000 [drp] | Freq: Every day | OPHTHALMIC | Status: DC
  Administered 2015-06-25 – 2015-06-27 (×3): 1 [drp] via OPHTHALMIC
  Filled 2015-06-25: qty 5

## 2015-06-25 MED ORDER — TRAZODONE HCL 100 MG PO TABS
100.0000 mg | ORAL_TABLET | Freq: Every day | ORAL | Status: DC
  Administered 2015-06-25 – 2015-06-26 (×3): 100 mg via ORAL
  Filled 2015-06-25 (×4): qty 1

## 2015-06-25 MED ORDER — ASPIRIN EC 81 MG PO TBEC
81.0000 mg | DELAYED_RELEASE_TABLET | Freq: Every day | ORAL | Status: DC
  Administered 2015-06-25 – 2015-06-27 (×3): 81 mg via ORAL
  Filled 2015-06-25 (×3): qty 1

## 2015-06-25 MED ORDER — INSULIN ASPART 100 UNIT/ML ~~LOC~~ SOLN
0.0000 [IU] | Freq: Three times a day (TID) | SUBCUTANEOUS | Status: DC
  Administered 2015-06-25: 1 [IU] via SUBCUTANEOUS
  Administered 2015-06-25: 2 [IU] via SUBCUTANEOUS
  Administered 2015-06-25: 3 [IU] via SUBCUTANEOUS
  Administered 2015-06-26: 1 [IU] via SUBCUTANEOUS
  Administered 2015-06-26: 3 [IU] via SUBCUTANEOUS
  Administered 2015-06-26: 2 [IU] via SUBCUTANEOUS

## 2015-06-25 MED ORDER — HYDRALAZINE HCL 20 MG/ML IJ SOLN
5.0000 mg | Freq: Once | INTRAMUSCULAR | Status: AC
  Administered 2015-06-25: 5 mg via INTRAVENOUS
  Filled 2015-06-25: qty 1

## 2015-06-25 MED ORDER — SODIUM CHLORIDE 0.9 % IV SOLN
INTRAVENOUS | Status: DC
  Administered 2015-06-25 – 2015-06-27 (×3): via INTRAVENOUS

## 2015-06-25 MED ORDER — INSULIN GLARGINE 100 UNIT/ML ~~LOC~~ SOLN
25.0000 [IU] | Freq: Every day | SUBCUTANEOUS | Status: DC
  Administered 2015-06-25 – 2015-06-26 (×3): 25 [IU] via SUBCUTANEOUS
  Filled 2015-06-25 (×5): qty 0.25

## 2015-06-25 MED ORDER — BUDESONIDE-FORMOTEROL FUMARATE 160-4.5 MCG/ACT IN AERO
2.0000 | INHALATION_SPRAY | Freq: Two times a day (BID) | RESPIRATORY_TRACT | Status: DC
  Filled 2015-06-25: qty 6

## 2015-06-25 MED ORDER — PANTOPRAZOLE SODIUM 40 MG PO TBEC
40.0000 mg | DELAYED_RELEASE_TABLET | Freq: Every day | ORAL | Status: DC
  Administered 2015-06-25 – 2015-06-27 (×3): 40 mg via ORAL
  Filled 2015-06-25 (×2): qty 1

## 2015-06-25 MED ORDER — ADULT MULTIVITAMIN W/MINERALS CH
1.0000 | ORAL_TABLET | Freq: Every day | ORAL | Status: DC
  Administered 2015-06-25 – 2015-06-27 (×3): 1 via ORAL
  Filled 2015-06-25 (×3): qty 1

## 2015-06-25 MED ORDER — ACETAMINOPHEN 325 MG PO TABS
650.0000 mg | ORAL_TABLET | ORAL | Status: DC | PRN
  Administered 2015-06-25 – 2015-06-26 (×2): 650 mg via ORAL
  Filled 2015-06-25 (×2): qty 2

## 2015-06-25 NOTE — H&P (Signed)
[facility] History and Physical  Wilgus Deyton ZOX:[id number] DOB: 1961/04/25 DOA: 06/24/2015  Referring physician: Francee Piccolo, PA-C PCP: No PCP Per Patient   Chief Complaint: Chest Pain.  HPI: Jose [name] is a 54 y.o. male with past medical history of CAD, hypertension, type 2 diabetes, hyperlipidemia, asthma, alcohol dependence and cocaine abuse who came to the ER due to intermittent chest pain. Patient stated to the medical staff earlier that he has been having chest pain for the past 2-3 days, pain is precordial, nonradiating associated with dyspnea, diaphoresis, nausea and emesis. He states that his pain got worse after he used some inhaled cocaine. He states that he also drinks at least a 12 pack of beer every day. Today he also had 8 bottles 16 ounces of water trying to feel better.  He is currently sedated with Ativan and is in no acute distress. He can answer briefly some questions, but goes back to sleep quickly.   Review of Systems:  Sedated with lorazepam. Unable to fully obtain.  Past Medical History  Diagnosis Date  . Diabetes mellitus, type 2   . Hypertension   . Asthma   . Pancreatitis   . Coronary artery disease     s/p PCI RCA '10, s/p PCI LAD '11  . Depressed   . HLD (hyperlipidemia)   . CKD (chronic kidney disease)    Past Surgical History  Procedure Laterality Date  . Cardiac catheterization    . Coronary stent placement    . Left heart catheterization with coronary angiogram Bilateral 08/25/2012    Procedure: LEFT HEART CATHETERIZATION WITH CORONARY ANGIOGRAM;  Surgeon: Kathleene Hazel, MD;  Location: [facility] CATH LAB;  Service: Cardiovascular;  Laterality: Bilateral;   Social History:  reports that he quit smoking about 7 years ago. His smoking use included Cigarettes. He has never used smokeless tobacco. He reports that he drinks alcohol. He reports that he uses illicit drugs (Cocaine).  No Known Allergies  Family History  Problem  Relation Age of Onset  . Heart disease Mother     MI 48s    Prior to Admission medications   Medication Sig Start Date End Date Taking? Authorizing Provider  albuterol (PROVENTIL HFA;VENTOLIN HFA) 108 (90 BASE) MCG/ACT inhaler Inhale 2 puffs into the lungs every 6 (six) hours as needed for wheezing or shortness of breath.    Yes Historical Provider, MD  aspirin EC 81 MG tablet Take 81 mg by mouth daily.   Yes Historical Provider, MD  brimonidine (ALPHAGAN) 0.2 % ophthalmic solution Place 1 drop into both eyes daily.  04/11/15  Yes Historical Provider, MD  budesonide-formoterol (SYMBICORT) 160-4.5 MCG/ACT inhaler Inhale 2 puffs into the lungs 2 (two) times daily.   Yes Historical Provider, MD  insulin glargine (LANTUS) 100 UNIT/ML injection Inject 25 Units into the skin at bedtime.    Yes Historical Provider, MD  latanoprost (XALATAN) 0.005 % ophthalmic solution Place 1 drop into both eyes at bedtime.  04/11/15  Yes Historical Provider, MD  Multiple Vitamin (MULTIVITAMIN WITH MINERALS) TABS tablet Take 1 tablet by mouth daily.   Yes Historical Provider, MD  Omega-3 Fatty Acids (FISH OIL) 1000 MG CAPS Take 1,000 mg by mouth daily.    Yes Historical Provider, MD  traZODone (DESYREL) 100 MG tablet Take 100 mg by mouth at bedtime.   Yes Historical Provider, MD  HYDROcodone-acetaminophen (NORCO/VICODIN) 5-325 MG per tablet Take 1-2 tablets every 6 hours as needed for severe pain Patient not taking: Reported  on 06/24/2015 12/24/14   Renne Crigler, PA-C  methocarbamol (ROBAXIN) 500 MG tablet Take 2 tablets (1,000 mg total) by mouth 4 (four) times daily. Patient not taking: Reported on 06/24/2015 12/24/14   Renne Crigler, PA-C   Physical Exam: Filed Vitals:   06/24/15 2230 06/24/15 2300 06/24/15 2327 06/24/15 2330  BP: 157/90 146/74 143/77 161/79  Pulse: 66 58 76 64  Temp:      TempSrc:      Resp:   18   SpO2: 99% 99% 100% 100%    Wt Readings from Last 3 Encounters:  05/13/15 73.846 kg (162 lb 12.8  oz)  05/10/14 79.379 kg (175 lb)  12/06/13 81.647 kg (180 lb)    General:  Appears calm and comfortable Eyes: PERRL, normal lids, irises & conjunctiva ENT: grossly normal hearing, lips & tongue Neck: no LAD, masses or thyromegaly Cardiovascular: RRR, no m/r/g. No LE edema. Telemetry: SR, no arrhythmias  Respiratory: CTA bilaterally, no w/r/r. Normal respiratory effort. Abdomen: soft, ntnd Skin: no rash or induration seen on limited exam Musculoskeletal: grossly normal tone BUE/BLE Psychiatric: Sedated with Ativan Neurologic: Sedated with Ativan.           Labs on Admission:  Basic Metabolic Panel:  Recent Labs Lab 06/24/15 1924 06/24/15 2251  NA 117* 120*  K 4.3 3.7  CL 83* 88*  CO2 18* 21*  GLUCOSE 181* 155*  BUN 15 14  CREATININE 1.33* 1.16  CALCIUM 9.3 8.7*   Liver Function Tests:  Recent Labs Lab 06/24/15 1731  AST 42*  ALT 35  ALKPHOS 74  BILITOT 0.7  PROT 6.8  ALBUMIN 3.8   No results for input(s): LIPASE, AMYLASE in the last 168 hours. No results for input(s): AMMONIA in the last 168 hours. CBC:  Recent Labs Lab 06/24/15 1924 06/24/15 2127  WBC 7.2 6.1  NEUTROABS  --  3.5  HGB 14.7 13.5  HCT 37.9* 35.5*  MCV 74.2* 74.0*  PLT 174 160    Radiological Exams on Admission: Dg Chest 2 View  06/24/2015   CLINICAL DATA:  Intermittent central chest pain 2 days with shortness of breath, vomiting and diaphoresis. States cocaine use last night.  EXAM: CHEST  2 VIEW  COMPARISON:  05/12/2015  FINDINGS: Lungs are clear. Cardiomediastinal silhouette is within normal. Prominent right paravertebral osteophyte over the mid thoracic spine.  IMPRESSION: No active cardiopulmonary disease.   Electronically Signed   By: Elberta Fortis M.D.   On: 06/24/2015 20:01    EKG: Independently reviewed. Vent rate 63 bpm PR interval 146 ms  QRS duration 84 ms  Normal sinus rhythm Cannot rule out anterior exam. Abnormal EKG.  Assessment/Plan Principal Problem:    Hyponatremia  Admit to stepdown. Seizure precautions due to hyponatremia. Replace sodium with normal saline and monitor sodium level periodically. Monitor potassium level and replace if needed. Follow up magnesium and phosphorus levels.  Active Problems:   Alcohol dependence Continue CIWA protocol, thiamine and magnesium replacement.    Chest Pain.   Cocaine abuse Telemetry monitoring, serial troponin levels, nitroglycerin sublingually when necessary for chest pain.    Nausea and vomiting Continue IV fluids and monitor electrolytes.    Diabetes mellitus, type 2 Continue Lantus,CBG monitoring with regular insulin coverage. Carb-controlled diet.    Hypertension There are no listed antihypertensive on the patient's home medications. His blood pressure seems to be doing a lot better with sedation.  Continue monitoring blood pressure. Consider starting ACE inhibitor if needed.     Code  Status: Full code.  DVT Prophylaxis:Lovenox SQ.  Family CommunicationOlvin, Rohr [phone]  [phone]   Vasques,Hermon Brother [phone]      Disposition Plan:   Time spent: About 90 minutes.  Bobette Mo [facility] Pager [phone]

## 2015-06-25 NOTE — Care Management Note (Signed)
Case Management Note  Patient Details  Name: Jose [name] MRN: [id number] Date of Birth: 03-01-1961  Subjective/Objective:     adm w hyponatremia, chest pain               Action/Plan: lives at home   Expected Discharge Date:                  Expected Discharge Plan:  Home/Self Care  In-House Referral:     Discharge planning Services     Post Acute Care Choice:    Choice offered to:     DME Arranged:    DME Agency:     HH Arranged:    HH Agency:     Status of Service:     Medicare Important Message Given:    Date Medicare IM Given:    Medicare IM give by:    Date Additional Medicare IM Given:    Additional Medicare Important Message give by:     If discussed at Long Length of Stay Meetings, dates discussed:    Additional Comments: ur review done  Hanley Hays, RN 06/25/2015, 8:57 AM

## 2015-06-25 NOTE — ED Notes (Signed)
REPORT GIVEN TO RN ON  2900

## 2015-06-25 NOTE — Progress Notes (Signed)
Patient seen and examined. Admitted after midnight secondary to hyponatremia and CP. Patient with hx of alcohol abuse and cocaine abuse. Denies SOB, diaphoresis or any other associated complaints. Please referred to H&P done by Dr. Robb Matar for further info/details on admission.  Plan: -continue IVF fluids -follow sodium level -cycle cardiac enzymes -check 2-D echo -continue PPI -on CIWA  Vassie Loll [phone]

## 2015-06-26 ENCOUNTER — Inpatient Hospital Stay (HOSPITAL_COMMUNITY): Payer: BLUE CROSS/BLUE SHIELD

## 2015-06-26 DIAGNOSIS — R079 Chest pain, unspecified: Secondary | ICD-10-CM

## 2015-06-26 DIAGNOSIS — F1010 Alcohol abuse, uncomplicated: Secondary | ICD-10-CM | POA: Diagnosis not present

## 2015-06-26 DIAGNOSIS — K219 Gastro-esophageal reflux disease without esophagitis: Secondary | ICD-10-CM

## 2015-06-26 DIAGNOSIS — E871 Hypo-osmolality and hyponatremia: Secondary | ICD-10-CM | POA: Diagnosis not present

## 2015-06-26 DIAGNOSIS — N400 Enlarged prostate without lower urinary tract symptoms: Secondary | ICD-10-CM

## 2015-06-26 LAB — GLUCOSE, CAPILLARY
GLUCOSE-CAPILLARY: 265 mg/dL — AB (ref 65–99)
Glucose-Capillary: 137 mg/dL — ABNORMAL HIGH (ref 65–99)
Glucose-Capillary: 189 mg/dL — ABNORMAL HIGH (ref 65–99)
Glucose-Capillary: 249 mg/dL — ABNORMAL HIGH (ref 65–99)

## 2015-06-26 MED ORDER — TAMSULOSIN HCL 0.4 MG PO CAPS
0.4000 mg | ORAL_CAPSULE | Freq: Every day | ORAL | Status: DC
  Administered 2015-06-26: 0.4 mg via ORAL
  Filled 2015-06-26 (×2): qty 1

## 2015-06-26 NOTE — Progress Notes (Signed)
Transferred to 2west room36 by wheelchair, stable, report given to RN, belongings with pt.

## 2015-06-26 NOTE — Progress Notes (Signed)
*  PRELIMINARY RESULTS* Echocardiogram 2D Echocardiogram has been performed.  Jose [name] 06/26/2015, 8:33 AM

## 2015-06-26 NOTE — Progress Notes (Signed)
Inpatient Diabetes Program Recommendations  AACE/ADA: New Consensus Statement on Inpatient Glycemic Control (2013)  Target Ranges:  Prepandial:   less than 140 mg/dL      Peak postprandial:   less than 180 mg/dL (1-2 hours)      Critically ill patients:  140 - 180 mg/dL   Results for JANCE, SIEK (MRN [id number]) as of 06/26/2015 07:38  Ref. Range 06/25/2015 07:50 06/25/2015 11:11 06/25/2015 17:14 06/25/2015 21:35  Glucose-Capillary Latest Ref Range: 65-99 mg/dL 409 (H) 811 (H) 914 (H) 218 (H)   Reason for Admission: CP  Diabetes history: DM 2 Outpatient Diabetes medications: Lantus 25 units QHS Current orders for Inpatient glycemic control: Lantus 25 units QHS, Novolog Sensitive TID  Inpatient Diabetes Program Recommendations Correction (SSI): If patient is not discharged, patient increased to 248 mg/dl around lunch time yest. Please consider increasing correction to Novolog Moderate scale TID and add HS scale as patient was over 200 at bedtime as well.  Thanks,  Christena Deem RN, MSN, [facility] Inpatient Diabetes Coordinator Team Pager [phone]

## 2015-06-26 NOTE — Progress Notes (Addendum)
[facility] PROGRESS NOTE  Jose [name] RUE:[id number] DOB: December 21, 1960 DOA: 06/24/2015 PCP: No PCP Per Patient  Assessment/Plan: 1-chest pain: due to use of cocaine and GERD -patient denies SOB and diaphoresis -no longer experiencing CP -troponin neg X 3,  Echo w/o wall motion abnormalities  -advise to quit recreational habits -will continue risk factors modification -continue PPI  2-Hyponatremia: due to dehydration and alcohol abuse -continue IVF's -will follow electrolytes -patient advise to quit alcohol -Na 125 today  3-alcohol abuse -no seizure activity -continue CIWA -no withdrawal symptoms  4-diabetes type 2: -continue SSI and lantus  5-BPH sx's: will start flomax  6-glaucoma: will continue latanaprost and brimonidine   7-depression/isnomnia: continue trazodone   8-GERD: continue PPI   9-COPD/Asthma: continue symbicort and albuterol -no wheezing.  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 Wall thickness was increased in a pattern of severe LVH. There was focal basal hypertrophy. Systolic function was normal. The estimated ejection fraction was in the range of 60% to 65%. Wall motion was normal; there were no regional wall motion abnormalities. - Aortic valve: There was mild regurgitation.  Antibiotics:  None   HPI/Subjective: Feeling better, no CP, no SOB. Patient afebrile and complaining only of nocturia and lightheadedness sensation.  Objective: Filed Vitals:   06/26/15 1352  BP:   Pulse: 65  Temp:   Resp:     Intake/Output Summary (Last 24 hours) at 06/26/15 1451 Last  data filed at 06/26/15 1400  Gross per 24 hour  Intake   3830 ml  Output   3375 ml  Net    455 ml   Filed Weights   06/25/15 0132  Weight: 78.2 kg (172 lb 6.4 oz)    Exam:   General:  Afebrile, feeling better and w/o CP. Patient denies SOB and main complains are lightheadedness sensation and nocturia (more than 6 times at night, sx's present even before admission)  Cardiovascular: S1 and S2, no rubs or gallops  Respiratory: CTA bilaterally  Abdomen: soft, NT, ND, positive BS  Musculoskeletal: no edema or swelling   Data Reviewed: Basic Metabolic Panel:  Recent Labs Lab 06/24/15 1924 06/24/15 2251 06/25/15 0250 06/25/15 1220 06/25/15 1721  NA 117* 120* 123* 123* 125*  K 4.3 3.7 4.0 4.7 4.6  CL 83* 88* 96* 94* 96*  CO2 18* 21* 20* 21* 23  GLUCOSE 181* 155* 119* 295* 159*  BUN CREATININE 1.33* 1.16 1.22 1.45* 1.60*  CALCIUM 9.3 8.7* 8.7* 8.6* 8.9  MG  --  2.1  --   --   --   PHOS  --  3.4  --   --   --    Liver Function Tests:  Recent Labs Lab 06/24/15 1731 06/25/15 0250  AST 42* 38  ALT 35 32  ALKPHOS 74 68  BILITOT 0.7 0.7  PROT 6.8 5.8*  ALBUMIN 3.8 3.5   CBC:  Recent Labs Lab 06/24/15 1924 06/24/15 2127  WBC 7.2 6.1  NEUTROABS  --  3.5  HGB 14.7 13.5  HCT 37.9* 35.5*  MCV 74.2* 74.0*  PLT 174 160   ProBNP (last 3 results)  No results for input(s): PROBNP in the last 8760 hours.  CBG:  Recent Labs Lab 06/25/15 1111 06/25/15 1714 06/25/15 2135 06/26/15 0728 06/26/15 1101  GLUCAP 248* 156* 218* 137* 189*    Recent Results (from the past 240 hour(s))  MRSA PCR Screening     Status: None   Collection Time: 06/25/15  1:42 AM  Result Value Ref Range Status   MRSA by PCR NEGATIVE NEGATIVE Final    Comment:        The GeneXpert MRSA Assay (FDA approved for NASAL specimens only), is one component of a comprehensive MRSA colonization surveillance program. It is not intended to diagnose MRSA infection nor to guide  or monitor treatment for MRSA infections.      Studies: Dg Chest 2 View  06/24/2015   CLINICAL DATA:  Intermittent central chest pain 2 days with shortness of breath, vomiting and diaphoresis. States cocaine use last night.  EXAM: CHEST  2 VIEW  COMPARISON:  05/12/2015  FINDINGS: Lungs are clear. Cardiomediastinal silhouette is within normal. Prominent right paravertebral osteophyte over the mid thoracic spine.  IMPRESSION: No active cardiopulmonary disease.   Electronically Signed   By: Elberta Fortis M.D.   On: 06/24/2015 20:01    Scheduled Meds: . antiseptic oral rinse  7 mL Mouth Rinse BID  . aspirin EC  81 mg Oral Daily  . brimonidine  1 drop Both Eyes Daily  . budesonide-formoterol  2 puff Inhalation BID  . enoxaparin (LOVENOX) injection  40 mg Subcutaneous Q24H  . folic acid  1 mg Oral Daily  . insulin aspart  0-9 Units Subcutaneous TID WC  . insulin glargine  25 Units Subcutaneous QHS  . latanoprost  1 drop Both Eyes QHS  . LORazepam  0-4 mg Intravenous 4 times per day  . LORazepam  0-4 mg Intravenous Q12H  . LORazepam  0-4 mg Oral 4 times per day  . LORazepam  0-4 mg Oral Q12H  . multivitamin with minerals  1 tablet Oral Daily  . pantoprazole  40 mg Oral Daily  . tamsulosin  0.4 mg Oral QPC supper  . thiamine  100 mg Oral Daily  . traZODone  100 mg Oral QHS   Continuous Infusions: . sodium chloride 100 mL/hr at 06/26/15 1007    Principal Problem:   Hyponatremia Active Problems:   Diabetes mellitus, type 2   Hypertension   Nausea and vomiting   Nausea with vomiting   Alcohol dependence   Cocaine abuse   Alcohol abuse    Time spent: 35 minutes    Vassie Loll  [facility] Pager [phone]. If 7PM-7AM, please contact night-coverage at [email or web address], password [facility] 06/26/2015, 2:51 PM  LOS: 1 day

## 2015-06-26 NOTE — Progress Notes (Signed)
Transfer report received from 2H at 1815 and pt arrived to the unit via wheelchair with belongings to the side at 1830. VSS, telemetry applied and verified; pt denies any pain, pt oriented to the unit and room, call light within reach. Will closely monitor. Dionne Bucy RN

## 2015-06-27 DIAGNOSIS — F1010 Alcohol abuse, uncomplicated: Secondary | ICD-10-CM

## 2015-06-27 DIAGNOSIS — I10 Essential (primary) hypertension: Secondary | ICD-10-CM

## 2015-06-27 DIAGNOSIS — E871 Hypo-osmolality and hyponatremia: Secondary | ICD-10-CM | POA: Diagnosis not present

## 2015-06-27 DIAGNOSIS — N179 Acute kidney failure, unspecified: Secondary | ICD-10-CM

## 2015-06-27 DIAGNOSIS — N189 Chronic kidney disease, unspecified: Secondary | ICD-10-CM

## 2015-06-27 DIAGNOSIS — F1410 Cocaine abuse, uncomplicated: Secondary | ICD-10-CM

## 2015-06-27 LAB — BASIC METABOLIC PANEL
Anion gap: 5 (ref 5–15)
BUN: 18 mg/dL (ref 6–20)
CHLORIDE: 108 mmol/L (ref 101–111)
CO2: 22 mmol/L (ref 22–32)
CREATININE: 1.32 mg/dL — AB (ref 0.61–1.24)
Calcium: 8.8 mg/dL — ABNORMAL LOW (ref 8.9–10.3)
GFR calc Af Amer: >60 mL/min (ref >60)
GFR calc non Af Amer: 59 mL/min — ABNORMAL LOW (ref >60)
GLUCOSE: 144 mg/dL — AB (ref 65–99)
Potassium: 4.2 mmol/L (ref 3.5–5.1)
Sodium: 135 mmol/L (ref 135–145)

## 2015-06-27 LAB — GLUCOSE, CAPILLARY
GLUCOSE-CAPILLARY: 108 mg/dL — AB (ref 65–99)
Glucose-Capillary: 110 mg/dL — ABNORMAL HIGH (ref 65–99)

## 2015-06-27 LAB — CBC
HCT: 36.3 % — ABNORMAL LOW (ref 39.0–52.0)
HEMOGLOBIN: 13.4 g/dL (ref 13.0–17.0)
MCH: 28.2 pg (ref 26.0–34.0)
MCHC: 36.9 g/dL — ABNORMAL HIGH (ref 30.0–36.0)
MCV: 76.3 fL — AB (ref 78.0–100.0)
PLATELETS: 159 10*3/uL (ref 150–400)
RBC: 4.76 MIL/uL (ref 4.22–5.81)
RDW: 13.1 % (ref 11.5–15.5)
WBC: 4.7 10*3/uL (ref 4.0–10.5)

## 2015-06-27 NOTE — Discharge Instructions (Signed)
Please stay well hydrated if working outdoors

## 2015-06-27 NOTE — Discharge Summary (Signed)
Physician Discharge Summary  Jose [name] WGN:[id number] DOB: [birth year] DOA: 06/24/2015  PCP: No PCP Per Patient  Admit date: 06/24/2015 Discharge date: 06/27/2015  Time spent: 20 minutes  Recommendations for Outpatient Follow-up:  1. Follow up with PCP in 1-2 weeks  Discharge Diagnoses:  Principal Problem:   Hyponatremia Active Problems:   Diabetes mellitus, type 2   Hypertension   Nausea and vomiting   Nausea with vomiting   Alcohol dependence   Cocaine abuse   Alcohol abuse   Acute on chronic renal failure   Discharge Condition: Improved  Diet recommendation: Diabetic  Filed Weights   06/25/15 0132  Weight: 78.2 kg (172 lb 6.4 oz)    History of present illness:  Please see admit h and p from 8/1 for details. Briefly, pt presented with chest pains in the setting of active cocaine and ETOH abuse. The patient was admitted for further work up.  Hospital Course:  1-chest pain: due to use of cocaine and GERD -patient denies SOB and diaphoresis -Resolved this hospital stay -troponin neg X 3, Echo w/o wall motion abnormalities  -advise to quit recreational habits -will continue risk factors modification -continued PPI  2-Hyponatremia: due to dehydration and alcohol abuse -continued IVF's -patient advise to quit alcohol -Sodium normalized  3-alcohol abuse -no seizure activity -continue CIWA -no withdrawal symptoms  4-diabetes type 2: -continue SSI and lantus  5-BPH sx's: will start flomax  6-glaucoma: will continue latanaprost and brimonidine   7-depression/isnomnia: continue trazodone   8-GERD: continue PPI   9-COPD/Asthma: continue symbicort and albuterol -no wheezing.  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:  -due to dehydration most likely -no signs of infection in UA -Improved with IVF's   Discharge Exam: Filed Vitals:   06/26/15 1938 06/26/15 2055 06/27/15 0343 06/27/15 0758  BP: 148/67 149/73 141/83   Pulse: 66 67 50 65  Temp: 98 F (36.7  C)  97.7 F (36.5 C)   TempSrc: Oral  Oral   Resp: Height:      Weight:      SpO2: 98%  100% 100%    General: Awake, in nad Cardiovascular: regular, s1, s2 Respiratory: normal resp effort, no wheezing  Discharge Instructions     Medication List    TAKE these medications        albuterol 108 (90 BASE) MCG/ACT inhaler  Commonly known as:  PROVENTIL HFA;VENTOLIN HFA  Inhale 2 puffs into the lungs every 6 (six) hours as needed for wheezing or shortness of breath.     aspirin EC 81 MG tablet  Take 81 mg by mouth daily.     brimonidine 0.2 % ophthalmic solution  Commonly known as:  ALPHAGAN  Place 1 drop into both eyes daily.     budesonide-formoterol 160-4.5 MCG/ACT inhaler  Commonly known as:  SYMBICORT  Inhale 2 puffs into the lungs 2 (two) times daily.     Fish Oil 1000 MG Caps  Take 1,000 mg by mouth daily.     HYDROcodone-acetaminophen 5-325 MG per tablet  Commonly known as:  NORCO/VICODIN  Take 1-2 tablets every 6 hours as needed for severe pain     insulin glargine 100 UNIT/ML injection  Commonly known as:  LANTUS  Inject 25 Units into the skin at bedtime.     latanoprost 0.005 % ophthalmic solution  Commonly known as:  XALATAN  Place 1 drop into both eyes at bedtime.     methocarbamol 500 MG tablet  Commonly known  as:  ROBAXIN  Take 2 tablets (1,000 mg total) by mouth 4 (four) times daily.     multivitamin with minerals Tabs tablet  Take 1 tablet by mouth daily.     traZODone 100 MG tablet  Commonly known as:  DESYREL  Take 100 mg by mouth at bedtime.       No Known Allergies Follow-up Information    Follow up with Follow up with your PCP In 2 weeks.   Why:  Hospital follow up       The results of significant diagnostics from this hospitalization (including imaging, microbiology, ancillary and laboratory) are listed below for reference.    Significant Diagnostic Studies: Dg Chest 2 View  06/24/2015   CLINICAL DATA:   Intermittent central chest pain 2 days with shortness of breath, vomiting and diaphoresis. States cocaine use last night.  EXAM: CHEST  2 VIEW  COMPARISON:  05/12/2015  FINDINGS: Lungs are clear. Cardiomediastinal silhouette is within normal. Prominent right paravertebral osteophyte over the mid thoracic spine.  IMPRESSION: No active cardiopulmonary disease.   Electronically Signed   By: Elberta Fortis M.D.   On: 06/24/2015 20:01    Microbiology: Recent Results (from the past 240 hour(s))  MRSA PCR Screening     Status: None   Collection Time: 06/25/15  1:42 AM  Result Value Ref Range Status   MRSA by PCR NEGATIVE NEGATIVE Final    Comment:        The GeneXpert MRSA Assay (FDA approved for NASAL specimens only), is one component of a comprehensive MRSA colonization surveillance program. It is not intended to diagnose MRSA infection nor to guide or monitor treatment for MRSA infections.      Labs: Basic Metabolic Panel:  Recent Labs Lab 06/24/15 2251 06/25/15 0250 06/25/15 1220 06/25/15 1721 06/27/15 0409  NA 120* 123* 123* 125* 135  K 3.7 4.0 4.7 4.6 4.2  CL 88* 96* 94* 96* 108  CO2 21* 20* 21* 23 22  GLUCOSE 155* 119* 295* 159* 144*  BUN 14 13 16 16 18   CREATININE 1.16 1.22 1.45* 1.60* 1.32*  CALCIUM 8.7* 8.7* 8.6* 8.9 8.8*  MG 2.1  --   --   --   --   PHOS 3.4  --   --   --   --    Liver Function Tests:  Recent Labs Lab 06/24/15 1731 06/25/15 0250  AST 42* 38  ALT 35 32  ALKPHOS 74 68  BILITOT 0.7 0.7  PROT 6.8 5.8*  ALBUMIN 3.8 3.5   No results for input(s): LIPASE, AMYLASE in the last 168 hours. No results for input(s): AMMONIA in the last 168 hours. CBC:  Recent Labs Lab 06/24/15 1924 06/24/15 2127 06/27/15 0409  WBC 7.2 6.1 4.7  NEUTROABS  --  3.5  --   HGB 14.7 13.5 13.4  HCT 37.9* 35.5* 36.3*  MCV 74.2* 74.0* 76.3*  PLT 174 160 159   Cardiac Enzymes: No results for input(s): CKTOTAL, CKMB, CKMBINDEX, TROPONINI in the last 168  hours. BNP: BNP (last 3 results) No results for input(s): BNP in the last 8760 hours.  ProBNP (last 3 results) No results for input(s): PROBNP in the last 8760 hours.  CBG:  Recent Labs Lab 06/26/15 0728 06/26/15 1101 06/26/15 1644 06/26/15 2124 06/27/15 0551  GLUCAP 137* 189* 249* 265* 108*       Signed:  Jayme Mednick K  [facility] 06/27/2015, 10:06 AM

## 2015-06-27 NOTE — Evaluation (Signed)
Physical Therapy Evaluation and Discharge Patient Details Name: Jose [name] MRN: [id number] DOB: 1961/04/03 Today's Date: 06/27/2015   History of Present Illness  Pt is a 54 y/o male with a PMH of CAD, HTN, DMII, hyperlipidemia, asthma, alcohol dependence and cocaine abuse. He presented to the ED with intermittent chest pain 2-3 days PTA. He states that his pain got worse after he used some inhaled cocaine. He states that he also drinks at least a 12 pack of beer every day. Today he also had 8 bottles 16 ounces of water trying to feel better.  Clinical Impression  Patient evaluated by Physical Therapy with no further acute PT needs identified. All education has been completed and the patient has no further questions. At the time of PT eval pt was independent/mod I with all mobility. Pt states he is at baseline of function and plans to return to work on Monday. See below for any follow-up Physial Therapy or equipment needs. PT is signing off. Thank you for this referral.     Follow Up Recommendations No PT follow up    Equipment Recommendations  None recommended by PT    Recommendations for Other Services       Precautions / Restrictions Precautions Precautions: Fall Restrictions Weight Bearing Restrictions: No      Mobility  Bed Mobility Overal bed mobility: Independent                Transfers Overall transfer level: Independent Equipment used: None                Ambulation/Gait Ambulation/Gait assistance: Modified independent (Device/Increase time) Ambulation Distance (Feet): 175 Feet Assistive device: None Gait Pattern/deviations: WFL(Within Functional Limits)   Gait velocity interpretation: at or above normal speed for age/gender General Gait Details: Appeared slightly unsteady x1 but recovered independently and did not require assistance. Pt states he feels a little lightheaded because he has not been out of bed.   Stairs Stairs:  (Pt declines stair  training)          Wheelchair Mobility    Modified Rankin (Stroke Patients Only)       Balance Overall balance assessment: No apparent balance deficits (not formally assessed)                                           Pertinent Vitals/Pain Pain Assessment: No/denies pain    Home Living Family/patient expects to be discharged to:: Private residence Living Arrangements: Alone Available Help at Discharge: Friend(s);Family;Available PRN/intermittently Type of Home: Other(Comment) (Hotel/motel) Home Access: Stairs to enter   Entrance Stairs-Number of Steps: 2 flights Home Layout: One level Home Equipment: None Additional Comments: Pt reports he rents a room in a place just for men. He describes it as a hotel/motel.    Prior Function Level of Independence: Independent         Comments: Working full time. Can stay out of work until [employer] (5 days from today)     Hand Dominance   Dominant Hand: Right    Extremity/Trunk Assessment   Upper Extremity Assessment: Overall WFL for tasks assessed           Lower Extremity Assessment: Overall WFL for tasks assessed      Cervical / Trunk Assessment: Normal  Communication   Communication: No difficulties  Cognition Arousal/Alertness: Awake/alert Behavior During Therapy: WFL for tasks assessed/performed Overall Cognitive Status:  Within Functional Limits for tasks assessed                      General Comments      Exercises        Assessment/Plan    PT Assessment Patent does not need any further PT services  PT Diagnosis Difficulty walking   PT Problem List    PT Treatment Interventions     PT Goals (Current goals can be found in the Care Plan section) Acute Rehab PT Goals PT Goal Formulation: All assessment and education complete, DC therapy    Frequency     Barriers to discharge        Co-evaluation               End of Session   Activity Tolerance: Patient  tolerated treatment well Patient left: in bed;with call bell/phone within reach Nurse Communication: Mobility status;Other (comment) (Asking for RN as he has questions about meds)         Time: 1103-1120 PT Time Calculation (min) (ACUTE ONLY): 17 min   Charges:   PT Evaluation $Initial PT Evaluation Tier I: 1 Procedure     PT G Codes:        Conni Slipper 07/16/2015, 12:20 PM   Conni Slipper, PT, DPT Acute Rehabilitation Services Pager: [phone]

## 2015-06-27 NOTE — Progress Notes (Signed)
OT Cancellation Note  Patient Details Name: Jose [name] MRN: [id number] DOB: [birth year]   Cancelled Treatment:    Reason Eval/Treat Not Completed: OT screened, no needs identified, will sign off  Earlie Raveling OTR/L [phone] 06/27/2015, 11:20 AM

## 2015-07-23 ENCOUNTER — Other Ambulatory Visit: Payer: Self-pay

## 2015-07-23 ENCOUNTER — Inpatient Hospital Stay (HOSPITAL_COMMUNITY)
Admission: EM | Admit: 2015-07-23 | Discharge: 2015-07-25 | DRG: 309 | Disposition: A | Discharge status: Home / Self Care | Payer: BLUE CROSS/BLUE SHIELD | Attending: Internal Medicine | Admitting: Internal Medicine

## 2015-07-23 ENCOUNTER — Encounter (HOSPITAL_COMMUNITY): Payer: Self-pay | Admitting: Internal Medicine

## 2015-07-23 ENCOUNTER — Emergency Department (HOSPITAL_COMMUNITY): Payer: BLUE CROSS/BLUE SHIELD

## 2015-07-23 DIAGNOSIS — Z955 Presence of coronary angioplasty implant and graft: Secondary | ICD-10-CM

## 2015-07-23 DIAGNOSIS — D696 Thrombocytopenia, unspecified: Secondary | ICD-10-CM

## 2015-07-23 DIAGNOSIS — E871 Hypo-osmolality and hyponatremia: Secondary | ICD-10-CM | POA: Diagnosis present

## 2015-07-23 DIAGNOSIS — I959 Hypotension, unspecified: Secondary | ICD-10-CM | POA: Diagnosis present

## 2015-07-23 DIAGNOSIS — E119 Type 2 diabetes mellitus without complications: Secondary | ICD-10-CM | POA: Diagnosis present

## 2015-07-23 DIAGNOSIS — Z87891 Personal history of nicotine dependence: Secondary | ICD-10-CM | POA: Diagnosis not present

## 2015-07-23 DIAGNOSIS — F1010 Alcohol abuse, uncomplicated: Secondary | ICD-10-CM | POA: Diagnosis present

## 2015-07-23 DIAGNOSIS — D72819 Decreased white blood cell count, unspecified: Secondary | ICD-10-CM | POA: Diagnosis present

## 2015-07-23 DIAGNOSIS — N179 Acute kidney failure, unspecified: Secondary | ICD-10-CM | POA: Diagnosis not present

## 2015-07-23 DIAGNOSIS — D6959 Other secondary thrombocytopenia: Secondary | ICD-10-CM | POA: Diagnosis present

## 2015-07-23 DIAGNOSIS — Z79899 Other long term (current) drug therapy: Secondary | ICD-10-CM

## 2015-07-23 DIAGNOSIS — J45909 Unspecified asthma, uncomplicated: Secondary | ICD-10-CM | POA: Diagnosis present

## 2015-07-23 DIAGNOSIS — F1410 Cocaine abuse, uncomplicated: Secondary | ICD-10-CM | POA: Diagnosis present

## 2015-07-23 DIAGNOSIS — N182 Chronic kidney disease, stage 2 (mild): Secondary | ICD-10-CM | POA: Diagnosis present

## 2015-07-23 DIAGNOSIS — F1020 Alcohol dependence, uncomplicated: Secondary | ICD-10-CM | POA: Diagnosis present

## 2015-07-23 DIAGNOSIS — R001 Bradycardia, unspecified: Secondary | ICD-10-CM | POA: Diagnosis not present

## 2015-07-23 DIAGNOSIS — R6510 Systemic inflammatory response syndrome (SIRS) of non-infectious origin without acute organ dysfunction: Secondary | ICD-10-CM

## 2015-07-23 DIAGNOSIS — I10 Essential (primary) hypertension: Secondary | ICD-10-CM | POA: Diagnosis present

## 2015-07-23 DIAGNOSIS — T510X1A Toxic effect of ethanol, accidental (unintentional), initial encounter: Secondary | ICD-10-CM | POA: Diagnosis present

## 2015-07-23 DIAGNOSIS — Y929 Unspecified place or not applicable: Secondary | ICD-10-CM | POA: Diagnosis not present

## 2015-07-23 DIAGNOSIS — E785 Hyperlipidemia, unspecified: Secondary | ICD-10-CM | POA: Diagnosis present

## 2015-07-23 DIAGNOSIS — F1420 Cocaine dependence, uncomplicated: Secondary | ICD-10-CM | POA: Diagnosis present

## 2015-07-23 DIAGNOSIS — Z794 Long term (current) use of insulin: Secondary | ICD-10-CM

## 2015-07-23 DIAGNOSIS — E1159 Type 2 diabetes mellitus with other circulatory complications: Secondary | ICD-10-CM | POA: Diagnosis present

## 2015-07-23 DIAGNOSIS — T68XXXA Hypothermia, initial encounter: Secondary | ICD-10-CM | POA: Diagnosis present

## 2015-07-23 DIAGNOSIS — R112 Nausea with vomiting, unspecified: Secondary | ICD-10-CM

## 2015-07-23 DIAGNOSIS — I129 Hypertensive chronic kidney disease with stage 1 through stage 4 chronic kidney disease, or unspecified chronic kidney disease: Secondary | ICD-10-CM | POA: Diagnosis present

## 2015-07-23 DIAGNOSIS — Z79891 Long term (current) use of opiate analgesic: Secondary | ICD-10-CM

## 2015-07-23 DIAGNOSIS — Z7982 Long term (current) use of aspirin: Secondary | ICD-10-CM

## 2015-07-23 DIAGNOSIS — E1121 Type 2 diabetes mellitus with diabetic nephropathy: Secondary | ICD-10-CM

## 2015-07-23 DIAGNOSIS — G9340 Encephalopathy, unspecified: Secondary | ICD-10-CM | POA: Diagnosis not present

## 2015-07-23 DIAGNOSIS — R55 Syncope and collapse: Secondary | ICD-10-CM | POA: Diagnosis not present

## 2015-07-23 DIAGNOSIS — F329 Major depressive disorder, single episode, unspecified: Secondary | ICD-10-CM | POA: Diagnosis present

## 2015-07-23 DIAGNOSIS — E11649 Type 2 diabetes mellitus with hypoglycemia without coma: Secondary | ICD-10-CM

## 2015-07-23 DIAGNOSIS — N189 Chronic kidney disease, unspecified: Secondary | ICD-10-CM

## 2015-07-23 HISTORY — DX: Cocaine abuse, uncomplicated: F14.10

## 2015-07-23 HISTORY — DX: Alcohol abuse, uncomplicated: F10.10

## 2015-07-23 LAB — COMPREHENSIVE METABOLIC PANEL
ALT: 25 U/L (ref 17–63)
AST: 38 U/L (ref 15–41)
Albumin: 3.5 g/dL (ref 3.5–5.0)
Alkaline Phosphatase: 66 U/L (ref 38–126)
Anion gap: 9 (ref 5–15)
BILIRUBIN TOTAL: 0.5 mg/dL (ref 0.3–1.2)
BUN: 22 mg/dL — ABNORMAL HIGH (ref 6–20)
CHLORIDE: 89 mmol/L — AB (ref 101–111)
CO2: 20 mmol/L — ABNORMAL LOW (ref 22–32)
CREATININE: 1.57 mg/dL — AB (ref 0.61–1.24)
Calcium: 8.2 mg/dL — ABNORMAL LOW (ref 8.9–10.3)
GFR, EST AFRICAN AMERICAN: 55 mL/min — AB (ref >60)
GFR, EST NON AFRICAN AMERICAN: 48 mL/min — AB (ref >60)
Glucose, Bld: 181 mg/dL — ABNORMAL HIGH (ref 65–99)
Potassium: 3.9 mmol/L (ref 3.5–5.1)
Sodium: 118 mmol/L — CL (ref 135–145)
TOTAL PROTEIN: 5.7 g/dL — AB (ref 6.5–8.1)

## 2015-07-23 LAB — TROPONIN I
TROPONIN I: 0.03 ng/mL (ref <0.031)
TROPONIN I: 0.03 ng/mL (ref <0.031)

## 2015-07-23 LAB — ETHANOL

## 2015-07-23 LAB — RAPID URINE DRUG SCREEN, HOSP PERFORMED
AMPHETAMINES: NOT DETECTED
BARBITURATES: NOT DETECTED
BENZODIAZEPINES: NOT DETECTED
Cocaine: POSITIVE — AB
Opiates: NOT DETECTED
TETRAHYDROCANNABINOL: NOT DETECTED

## 2015-07-23 LAB — BLOOD GAS, ARTERIAL
ACID-BASE DEFICIT: 4.5 mmol/L — AB (ref 0.0–2.0)
BICARBONATE: 19.3 meq/L — AB (ref 20.0–24.0)
DRAWN BY: 307971
FIO2: 0.21
O2 SAT: 94 %
PO2 ART: 77.8 mmHg — AB (ref 80.0–100.0)
Patient temperature: 98.6
TCO2: 17.3 mmol/L (ref 0–100)
pCO2 arterial: 33.5 mmHg — ABNORMAL LOW (ref 35.0–45.0)
pH, Arterial: 7.38 (ref 7.350–7.450)

## 2015-07-23 LAB — URINALYSIS, ROUTINE W REFLEX MICROSCOPIC
Bilirubin Urine: NEGATIVE
GLUCOSE, UA: NEGATIVE mg/dL
HGB URINE DIPSTICK: NEGATIVE
Ketones, ur: NEGATIVE mg/dL
Leukocytes, UA: NEGATIVE
Nitrite: NEGATIVE
Protein, ur: NEGATIVE mg/dL
SPECIFIC GRAVITY, URINE: 1.011 (ref 1.005–1.030)
Urobilinogen, UA: 1 mg/dL (ref 0.0–1.0)
pH: 7 (ref 5.0–8.0)

## 2015-07-23 LAB — CBG MONITORING, ED: GLUCOSE-CAPILLARY: 202 mg/dL — AB (ref 65–99)

## 2015-07-23 LAB — CBC WITH DIFFERENTIAL/PLATELET
Basophils Absolute: 0 10*3/uL (ref 0.0–0.1)
Basophils Relative: 0 % (ref 0–1)
EOS PCT: 3 % (ref 0–5)
Eosinophils Absolute: 0.1 10*3/uL (ref 0.0–0.7)
HCT: 35.9 % — ABNORMAL LOW (ref 39.0–52.0)
Hemoglobin: 13.3 g/dL (ref 13.0–17.0)
LYMPHS ABS: 0.7 10*3/uL (ref 0.7–4.0)
LYMPHS PCT: 24 % (ref 12–46)
MCH: 27.8 pg (ref 26.0–34.0)
MCHC: 37 g/dL — ABNORMAL HIGH (ref 30.0–36.0)
MCV: 75.1 fL — AB (ref 78.0–100.0)
MONO ABS: 0.3 10*3/uL (ref 0.1–1.0)
Monocytes Relative: 11 % (ref 3–12)
Neutro Abs: 1.8 10*3/uL (ref 1.7–7.7)
Neutrophils Relative %: 61 % (ref 43–77)
PLATELETS: 144 10*3/uL — AB (ref 150–400)
RBC: 4.78 MIL/uL (ref 4.22–5.81)
RDW: 12.9 % (ref 11.5–15.5)
WBC: 2.9 10*3/uL — ABNORMAL LOW (ref 4.0–10.5)

## 2015-07-23 LAB — I-STAT CG4 LACTIC ACID, ED: LACTIC ACID, VENOUS: 1.11 mmol/L (ref 0.5–2.0)

## 2015-07-23 LAB — I-STAT TROPONIN, ED: Troponin i, poc: 0 ng/mL (ref 0.00–0.08)

## 2015-07-23 LAB — GLUCOSE, CAPILLARY
GLUCOSE-CAPILLARY: 146 mg/dL — AB (ref 65–99)
GLUCOSE-CAPILLARY: 66 mg/dL (ref 65–99)
Glucose-Capillary: 328 mg/dL — ABNORMAL HIGH (ref 65–99)

## 2015-07-23 MED ORDER — INSULIN ASPART 100 UNIT/ML ~~LOC~~ SOLN
0.0000 [IU] | Freq: Three times a day (TID) | SUBCUTANEOUS | Status: DC
  Administered 2015-07-23: 11 [IU] via SUBCUTANEOUS
  Administered 2015-07-24: 5 [IU] via SUBCUTANEOUS
  Administered 2015-07-24 (×2): 2 [IU] via SUBCUTANEOUS
  Administered 2015-07-25: 3 [IU] via SUBCUTANEOUS

## 2015-07-23 MED ORDER — INSULIN GLARGINE 100 UNIT/ML ~~LOC~~ SOLN
15.0000 [IU] | Freq: Every day | SUBCUTANEOUS | Status: DC
  Administered 2015-07-23: 15 [IU] via SUBCUTANEOUS
  Filled 2015-07-23: qty 0.15

## 2015-07-23 MED ORDER — VITAMIN B-1 100 MG PO TABS
100.0000 mg | ORAL_TABLET | Freq: Every day | ORAL | Status: DC
  Administered 2015-07-23 – 2015-07-25 (×3): 100 mg via ORAL
  Filled 2015-07-23 (×3): qty 1

## 2015-07-23 MED ORDER — ADULT MULTIVITAMIN W/MINERALS CH
1.0000 | ORAL_TABLET | Freq: Every day | ORAL | Status: DC
  Administered 2015-07-24 – 2015-07-25 (×2): 1 via ORAL
  Filled 2015-07-23 (×2): qty 1

## 2015-07-23 MED ORDER — ONDANSETRON HCL 4 MG PO TABS: 4.0000 mg | ORAL_TABLET | Freq: Four times a day (QID) | ORAL | Status: DC | PRN

## 2015-07-23 MED ORDER — LORAZEPAM 2 MG/ML IJ SOLN: 1.0000 mg | Freq: Four times a day (QID) | INTRAMUSCULAR | Status: DC | PRN

## 2015-07-23 MED ORDER — ASPIRIN EC 81 MG PO TBEC
81.0000 mg | DELAYED_RELEASE_TABLET | Freq: Every day | ORAL | Status: DC
  Administered 2015-07-24 – 2015-07-25 (×2): 81 mg via ORAL
  Filled 2015-07-23 (×2): qty 1

## 2015-07-23 MED ORDER — FOLIC ACID 1 MG PO TABS
1.0000 mg | ORAL_TABLET | Freq: Every day | ORAL | Status: DC
Start: 2015-07-23 — End: 2015-07-25
  Administered 2015-07-23 – 2015-07-25 (×3): 1 mg via ORAL
  Filled 2015-07-23 (×3): qty 1

## 2015-07-23 MED ORDER — LORAZEPAM 1 MG PO TABS: 0.0000 mg | ORAL_TABLET | Freq: Two times a day (BID) | ORAL | Status: DC

## 2015-07-23 MED ORDER — ACETAMINOPHEN 650 MG RE SUPP: 650.0000 mg | Freq: Four times a day (QID) | RECTAL | Status: DC | PRN

## 2015-07-23 MED ORDER — THIAMINE HCL 100 MG/ML IJ SOLN: 100.0000 mg | Freq: Every day | INTRAMUSCULAR | Status: DC

## 2015-07-23 MED ORDER — ASPIRIN 81 MG PO CHEW
324.0000 mg | CHEWABLE_TABLET | Freq: Once | ORAL | Status: AC
  Administered 2015-07-23: 324 mg via ORAL
  Filled 2015-07-23: qty 4

## 2015-07-23 MED ORDER — ONDANSETRON HCL 4 MG/2ML IJ SOLN: 4.0000 mg | Freq: Four times a day (QID) | INTRAMUSCULAR | Status: DC | PRN

## 2015-07-23 MED ORDER — INSULIN ASPART 100 UNIT/ML ~~LOC~~ SOLN: 0.0000 [IU] | Freq: Every day | SUBCUTANEOUS | Status: DC

## 2015-07-23 MED ORDER — SODIUM CHLORIDE 0.9 % IJ SOLN
3.0000 mL | Freq: Two times a day (BID) | INTRAMUSCULAR | Status: DC
  Administered 2015-07-24 (×2): 3 mL via INTRAVENOUS

## 2015-07-23 MED ORDER — TRAZODONE HCL 50 MG PO TABS
100.0000 mg | ORAL_TABLET | Freq: Every day | ORAL | Status: DC
  Administered 2015-07-23 – 2015-07-24 (×2): 100 mg via ORAL
  Filled 2015-07-23 (×2): qty 2

## 2015-07-23 MED ORDER — OMEGA-3-ACID ETHYL ESTERS 1 G PO CAPS
1.0000 g | ORAL_CAPSULE | Freq: Every day | ORAL | Status: DC
  Administered 2015-07-24 – 2015-07-25 (×2): 1 g via ORAL
  Filled 2015-07-23 (×2): qty 1

## 2015-07-23 MED ORDER — LORAZEPAM 1 MG PO TABS: 1.0000 mg | ORAL_TABLET | Freq: Four times a day (QID) | ORAL | Status: DC | PRN

## 2015-07-23 MED ORDER — ALBUTEROL SULFATE (2.5 MG/3ML) 0.083% IN NEBU
2.5000 mg | INHALATION_SOLUTION | Freq: Four times a day (QID) | RESPIRATORY_TRACT | Status: DC | PRN
  Administered 2015-07-23: 2.5 mg via RESPIRATORY_TRACT
  Filled 2015-07-23: qty 3

## 2015-07-23 MED ORDER — SODIUM CHLORIDE 0.9 % IV BOLUS (SEPSIS)
1000.0000 mL | Freq: Once | INTRAVENOUS | Status: AC
  Administered 2015-07-23: 1000 mL via INTRAVENOUS

## 2015-07-23 MED ORDER — SODIUM CHLORIDE 0.9 % IV SOLN
INTRAVENOUS | Status: DC
  Administered 2015-07-23 – 2015-07-25 (×6): via INTRAVENOUS

## 2015-07-23 MED ORDER — IPRATROPIUM-ALBUTEROL 0.5-2.5 (3) MG/3ML IN SOLN: 3.0000 mL | Freq: Four times a day (QID) | RESPIRATORY_TRACT | Status: DC | PRN

## 2015-07-23 MED ORDER — ALBUTEROL SULFATE HFA 108 (90 BASE) MCG/ACT IN AERS: 2.0000 | INHALATION_SPRAY | Freq: Four times a day (QID) | RESPIRATORY_TRACT | Status: DC | PRN

## 2015-07-23 MED ORDER — SODIUM CHLORIDE 0.9 % IV SOLN
INTRAVENOUS | Status: AC
  Administered 2015-07-23: 14:00:00 via INTRAVENOUS

## 2015-07-23 MED ORDER — INSULIN GLARGINE 100 UNIT/ML ~~LOC~~ SOLN: 15.0000 [IU] | Freq: Every day | SUBCUTANEOUS | Status: DC

## 2015-07-23 MED ORDER — BUDESONIDE-FORMOTEROL FUMARATE 160-4.5 MCG/ACT IN AERO
2.0000 | INHALATION_SPRAY | Freq: Two times a day (BID) | RESPIRATORY_TRACT | Status: DC
  Administered 2015-07-23 – 2015-07-25 (×4): 2 via RESPIRATORY_TRACT
  Filled 2015-07-23: qty 6

## 2015-07-23 MED ORDER — INSULIN GLARGINE 100 UNIT/ML ~~LOC~~ SOLN
25.0000 [IU] | Freq: Every day | SUBCUTANEOUS | Status: DC
  Filled 2015-07-23: qty 0.25

## 2015-07-23 MED ORDER — LORAZEPAM 1 MG PO TABS
0.0000 mg | ORAL_TABLET | Freq: Four times a day (QID) | ORAL | Status: DC
Start: 2015-07-23 — End: 2015-07-25

## 2015-07-23 MED ORDER — INSULIN ASPART 100 UNIT/ML ~~LOC~~ SOLN
4.0000 [IU] | Freq: Three times a day (TID) | SUBCUTANEOUS | Status: DC
Start: 2015-07-23 — End: 2015-07-24
  Administered 2015-07-23 – 2015-07-24 (×2): 4 [IU] via SUBCUTANEOUS

## 2015-07-23 MED ORDER — ENOXAPARIN SODIUM 40 MG/0.4ML ~~LOC~~ SOLN
40.0000 mg | SUBCUTANEOUS | Status: DC
  Administered 2015-07-23 – 2015-07-24 (×2): 40 mg via SUBCUTANEOUS
  Filled 2015-07-23 (×2): qty 0.4

## 2015-07-23 MED ORDER — ACETAMINOPHEN 325 MG PO TABS
650.0000 mg | ORAL_TABLET | Freq: Four times a day (QID) | ORAL | Status: DC | PRN
  Administered 2015-07-23: 650 mg via ORAL
  Filled 2015-07-23: qty 2

## 2015-07-23 MED ORDER — LATANOPROST 0.005 % OP SOLN
1.0000 [drp] | Freq: Every day | OPHTHALMIC | Status: DC
  Administered 2015-07-23 – 2015-07-24 (×2): 1 [drp] via OPHTHALMIC
  Filled 2015-07-23: qty 2.5

## 2015-07-23 MED ORDER — ALUM & MAG HYDROXIDE-SIMETH 200-200-20 MG/5ML PO SUSP: 30.0000 mL | Freq: Four times a day (QID) | ORAL | Status: DC | PRN

## 2015-07-23 MED ORDER — BRIMONIDINE TARTRATE 0.2 % OP SOLN
1.0000 [drp] | Freq: Every day | OPHTHALMIC | Status: DC
  Administered 2015-07-24 – 2015-07-25 (×2): 1 [drp] via OPHTHALMIC
  Filled 2015-07-23: qty 5

## 2015-07-23 MED ORDER — POLYETHYLENE GLYCOL 3350 17 G PO PACK: 17.0000 g | PACK | Freq: Every day | ORAL | Status: DC | PRN

## 2015-07-23 MED ORDER — IPRATROPIUM-ALBUTEROL 0.5-2.5 (3) MG/3ML IN SOLN: 3.0000 mL | Freq: Once | RESPIRATORY_TRACT | Status: DC

## 2015-07-23 NOTE — ED Notes (Signed)
MD at bedside. 

## 2015-07-23 NOTE — ED Provider Notes (Signed)
CSN: [id number]     Arrival date & time 07/23/15  1154 History   None    Chief Complaint  Patient presents with  . Hypotension  . hyperthermia      (Consider location/radiation/quality/duration/timing/severity/associated sxs/prior Treatment) Patient is a 54 y.o. male presenting with vomiting. The history is provided by the patient and the EMS personnel.  Emesis Severity:  Moderate Duration:  2 days Timing:  Constant Quality:  Undigested food Progression:  Worsening Chronicity:  New Recent urination:  Decreased Relieved by:  Nothing Worsened by:  Nothing tried Ineffective treatments:  None tried Associated symptoms: no abdominal pain, no cough, no diarrhea and no fever   Risk factors: alcohol use (6 pack per day per Pt) and diabetes (type 1)     Past Medical History  Diagnosis Date  . Diabetes mellitus, type 2   . Hypertension   . Asthma   . Pancreatitis   . Coronary artery disease     s/p PCI RCA '10, s/p PCI LAD '11  . Depressed   . HLD (hyperlipidemia)   . CKD (chronic kidney disease)    Past Surgical History  Procedure Laterality Date  . Cardiac catheterization    . Coronary stent placement    . Left heart catheterization with coronary angiogram Bilateral 08/25/2012    Procedure: LEFT HEART CATHETERIZATION WITH CORONARY ANGIOGRAM;  Surgeon: Kathleene Hazel, MD;  Location: [facility] CATH LAB;  Service: Cardiovascular;  Laterality: Bilateral;   Family History  Problem Relation Age of Onset  . Heart disease Mother     MI 64s   Social History  Substance Use Topics  . Smoking status: Former Smoker    Types: Cigarettes    Quit date: 11/25/2007  . Smokeless tobacco: Never Used  . Alcohol Use: Yes     Comment: use to drink "heavy", quit 2009. 06/24/15 12 pack of beer daily.    Review of Systems  Gastrointestinal: Positive for vomiting. Negative for abdominal pain and diarrhea.  All other systems reviewed and are negative.     Allergies  Review of  patient's allergies indicates no known allergies.  Home Medications   Prior to Admission medications   Medication Sig Start Date End Date Taking? Authorizing Provider  albuterol (PROVENTIL HFA;VENTOLIN HFA) 108 (90 BASE) MCG/ACT inhaler Inhale 2 puffs into the lungs every 6 (six) hours as needed for wheezing or shortness of breath.     Historical Provider, MD  aspirin EC 81 MG tablet Take 81 mg by mouth daily.    Historical Provider, MD  brimonidine (ALPHAGAN) 0.2 % ophthalmic solution Place 1 drop into both eyes daily.  04/11/15   Historical Provider, MD  budesonide-formoterol (SYMBICORT) 160-4.5 MCG/ACT inhaler Inhale 2 puffs into the lungs 2 (two) times daily.    Historical Provider, MD  HYDROcodone-acetaminophen (NORCO/VICODIN) 5-325 MG per tablet Take 1-2 tablets every 6 hours as needed for severe pain Patient not taking: Reported on 06/24/2015 12/24/14   Renne Crigler, PA-C  insulin glargine (LANTUS) 100 UNIT/ML injection Inject 25 Units into the skin at bedtime.     Historical Provider, MD  latanoprost (XALATAN) 0.005 % ophthalmic solution Place 1 drop into both eyes at bedtime.  04/11/15   Historical Provider, MD  methocarbamol (ROBAXIN) 500 MG tablet Take 2 tablets (1,000 mg total) by mouth 4 (four) times daily. Patient not taking: Reported on 06/24/2015 12/24/14   Renne Crigler, PA-C  Multiple Vitamin (MULTIVITAMIN WITH MINERALS) TABS tablet Take 1 tablet by mouth daily.  Historical Provider, MD  Omega-3 Fatty Acids (FISH OIL) 1000 MG CAPS Take 1,000 mg by mouth daily.     Historical Provider, MD  traZODone (DESYREL) 100 MG tablet Take 100 mg by mouth at bedtime.    Historical Provider, MD   BP 105/54 mmHg  Pulse 52  Temp(Src) 96.7 F (35.9 C) (Rectal)  Resp 14  SpO2 95% Physical Exam  Constitutional: He is oriented to person, place, and time. He appears well-developed and well-nourished. No distress.  HENT:  Head: Normocephalic and atraumatic.  Eyes: Conjunctivae are normal.   Neck: Neck supple. No tracheal deviation present.  Cardiovascular: Normal rate, regular rhythm and normal heart sounds.   Pulmonary/Chest: Effort normal. No respiratory distress.  Abdominal: Soft. He exhibits no distension. There is no tenderness.  Neurological: He is alert and oriented to person, place, and time. GCS eye subscore is 4. GCS verbal subscore is 5. GCS motor subscore is 6.  Skin: Skin is warm and dry.  Psychiatric: His affect is blunt. He is slowed.    ED Course  Procedures (including critical care time) Labs Review Labs Reviewed  CBC WITH DIFFERENTIAL/PLATELET - Abnormal; Notable for the following:    WBC 2.9 (*)    HCT 35.9 (*)    MCV 75.1 (*)    MCHC 37.0 (*)    Platelets 144 (*)    All other components within normal limits  COMPREHENSIVE METABOLIC PANEL - Abnormal; Notable for the following:    Sodium 118 (*)    Chloride 89 (*)    CO2 20 (*)    Glucose, Bld 181 (*)    BUN 22 (*)    Creatinine, Ser 1.57 (*)    Calcium 8.2 (*)    Total Protein 5.7 (*)    GFR calc non Af Amer 48 (*)    GFR calc Af Amer 55 (*)    All other components within normal limits  BLOOD GAS, ARTERIAL - Abnormal; Notable for the following:    pCO2 arterial 33.5 (*)    pO2, Arterial 77.8 (*)    Bicarbonate 19.3 (*)    Acid-base deficit 4.5 (*)    All other components within normal limits  URINE RAPID DRUG SCREEN, HOSP PERFORMED - Abnormal; Notable for the following:    Cocaine POSITIVE (*)    All other components within normal limits  GLUCOSE, CAPILLARY - Abnormal; Notable for the following:    Glucose-Capillary 328 (*)    All other components within normal limits  CBG MONITORING, ED - Abnormal; Notable for the following:    Glucose-Capillary 202 (*)    All other components within normal limits  URINALYSIS, ROUTINE W REFLEX MICROSCOPIC (NOT AT [facility])  TROPONIN I  ETHANOL  TROPONIN I  GLUCOSE, CAPILLARY  HEMOGLOBIN A1C  TROPONIN I  TROPONIN I  BASIC METABOLIC PANEL  CBC   CBG MONITORING, ED  I-STAT CG4 LACTIC ACID, ED  Rosezena Sensor, ED    Imaging Review Dg Chest Port 1 View  07/23/2015   CLINICAL DATA:  Chest pain and vomiting.  EXAM: PORTABLE CHEST - 1 VIEW  COMPARISON:  06/24/2015  FINDINGS: Both lungs are clear. Heart and mediastinum are within normal limits. The trachea is midline. No acute bone abnormality.  IMPRESSION: No active disease.   Electronically Signed   By: Richarda Overlie M.D.   On: 07/23/2015 13:27   I have personally reviewed and evaluated these images and lab results as part of my medical decision-making.   EKG Interpretation  Date/Time:  Monday July 23 2015 12:01:54 EDT Ventricular Rate:  54 PR Interval:  172 QRS Duration: 81 QT Interval:  517 QTC Calculation: 490 R Axis:   80 Text Interpretation:  Sinus rhythm Anteroseptal infarct, age indeterminate  Compared to previous tracing Nonspecific T wave abnormality now evident in  V1, V2 Confirmed by Leelan Rajewski MD, Arsalan Brisbin [phone]) on 07/23/2015 12:39:37 PM      MDM   Final diagnoses:  Acute hyponatremia  Leukopenia  Hypothermia, initial encounter  SIRS (systemic inflammatory response syndrome)    54 y.o. male presents with hypotensive episode from home. Found to be hypothermic, leukopenic, no obvious source of infection currently. Has acute on chronic hypoatremia. Has not taken insulin d/t persistent vomiting and not feeling well at home. Not in DKA currently. Hypotension resolved following fluid boluses. Hospitalist was consulted for admission and will see the patient in the emergency department.    Lyndal Pulley, MD 07/23/15 2221

## 2015-07-23 NOTE — Progress Notes (Signed)
Hypoglycemic Event  CBG: 66  Treatment: 15 GM carbohydrate snack  Symptoms: Hungry  Follow-up CBG: Time: 2227 CBG Result: 146  Possible Reasons for Event: Unknown  Comments/MD notified:[facility] on-call notified    Jose [name]  Remember to initiate Hypoglycemia Order Set & complete

## 2015-07-23 NOTE — H&P (Signed)
History and Physical:    Jose [name]   ZOX:[id number] DOB: 15-Nov-1961 DOA: 07/23/2015  Referring MD/provider: Lyndal Pulley, MD PCP: Pcp Not In System [facility], Dr. Westley Hummer (fax: [phone])  Chief Complaint: Nausea and vomiting, found hypotensive with syncope by EMS  History of Present Illness:   Jose [name] is an 54 y.o. male with a PMH of DM, HTN, asthma, pancreatitis, CAD s/p PCI RCA, polysubstance abuse including alcohol and cocaine was brought to the hospital via EMS after experiencing 2 days of nausea, vomiting, dizziness with decreased oral intake. Patient reported chest pain at 7 AM and ultimately called EMS around 10:30 where he was found to be hypotensive. He had a syncopal event when EMS arrived and his heart rate was reportedly 26 at that time. His rectal temperature was 74F. He was given a fluid bolus by EMS and transported to the ED for further evaluation. Upon initial evaluation in the ED, the patient was noted to have significant hyponatremia. His presentation is similar to his presentation on 05/12/15 where he was admitted with similar symptoms. His symptoms seem to have been triggered by alcohol and cocaine ingestion during that admission. The patient admits to doing cocaine this past weekend and also continues to drink alcohol, admits to up to a 6 pack every 2 days or so. He is asks me to get in touch with his PCP at the [facility] as he wants help with his substance abuse. Patient tells me he is hungry now and has not eaten in the past 2 days.  ROS:   Review of Systems  Constitutional: Positive for malaise/fatigue. Negative for fever, chills, weight loss and diaphoresis.  HENT: Negative.   Respiratory: Positive for shortness of breath. Negative for cough, hemoptysis, sputum production and wheezing.   Cardiovascular: Positive for chest pain. Negative for palpitations, orthopnea, claudication, leg swelling and PND.  Gastrointestinal:  Positive for nausea and vomiting. Negative for heartburn, abdominal pain, diarrhea, constipation, blood in stool and melena.  Genitourinary: Negative.   Musculoskeletal: Negative.   Skin: Negative.   Neurological: Positive for dizziness, loss of consciousness and weakness. Negative for tingling, tremors, sensory change, speech change, focal weakness and seizures.  Endo/Heme/Allergies: Negative.   Psychiatric/Behavioral: Positive for substance abuse.    Past Medical History:   Past Medical History  Diagnosis Date  . Diabetes mellitus, type 2   . Hypertension   . Asthma   . Pancreatitis   . Coronary artery disease     s/p PCI RCA '10, s/p PCI LAD '11  . Depressed   . HLD (hyperlipidemia)   . CKD (chronic kidney disease)   . Cocaine abuse   . Alcohol abuse     Past Surgical History:   Past Surgical History  Procedure Laterality Date  . Cardiac catheterization    . Coronary stent placement    . Left heart catheterization with coronary angiogram Bilateral 08/25/2012    Procedure: LEFT HEART CATHETERIZATION WITH CORONARY ANGIOGRAM;  Surgeon: Kathleene Hazel, MD;  Location[facility] CATH LAB;  Service: Cardiovascular;  Laterality: Bilateral;    Social History:   Social History   Social History  . Marital Status: Married    Spouse Name: N/A  . Number of Children: N/A  . Years of Education: N/A   Occupational History  . Not on file.   Social History Main Topics  . Smoking status: Former Smoker    Types: Cigarettes    Quit date: 11/25/2007  . Smokeless  tobacco: Never Used  . Alcohol Use: Yes     Comment: use to drink "heavy", quit 2009. 06/24/15 12 pack of beer daily.  . Drug Use: Yes    Special: Cocaine  . Sexual Activity: Yes    Birth Control/ Protection: None   Other Topics Concern  . Not on file   Social History Narrative    Family history:   Family History  Problem Relation Age of Onset  . Heart disease Mother     MI 81s    Allergies   Review of  patient's allergies indicates no known allergies.  Current Medications:   Prior to Admission medications   Medication Sig Start Date End Date Taking? Authorizing Provider  albuterol (PROVENTIL HFA;VENTOLIN HFA) 108 (90 BASE) MCG/ACT inhaler Inhale 2 puffs into the lungs every 6 (six) hours as needed for wheezing or shortness of breath.     Historical Provider, MD  aspirin EC 81 MG tablet Take 81 mg by mouth daily.    Historical Provider, MD  brimonidine (ALPHAGAN) 0.2 % ophthalmic solution Place 1 drop into both eyes daily.  04/11/15   Historical Provider, MD  budesonide-formoterol (SYMBICORT) 160-4.5 MCG/ACT inhaler Inhale 2 puffs into the lungs 2 (two) times daily.    Historical Provider, MD  HYDROcodone-acetaminophen (NORCO/VICODIN) 5-325 MG per tablet Take 1-2 tablets every 6 hours as needed for severe pain Patient not taking: Reported on 06/24/2015 12/24/14   Renne Crigler, PA-C  insulin glargine (LANTUS) 100 UNIT/ML injection Inject 25 Units into the skin at bedtime.     Historical Provider, MD  latanoprost (XALATAN) 0.005 % ophthalmic solution Place 1 drop into both eyes at bedtime.  04/11/15   Historical Provider, MD  methocarbamol (ROBAXIN) 500 MG tablet Take 2 tablets (1,000 mg total) by mouth 4 (four) times daily. Patient not taking: Reported on 06/24/2015 12/24/14   Renne Crigler, PA-C  Multiple Vitamin (MULTIVITAMIN WITH MINERALS) TABS tablet Take 1 tablet by mouth daily.    Historical Provider, MD  Omega-3 Fatty Acids (FISH OIL) 1000 MG CAPS Take 1,000 mg by mouth daily.     Historical Provider, MD  traZODone (DESYREL) 100 MG tablet Take 100 mg by mouth at bedtime.    Historical Provider, MD    Physical Exam:   Filed Vitals:   07/23/15 1300 07/23/15 1330 07/23/15 1346 07/23/15 1402  BP: 112/66 128/65  105/57  Pulse: 51 50  56  Temp:   97.8 F (36.6 C)   TempSrc:   Rectal   Resp: 14 11  11   SpO2: 100% 100%  100%     Physical Exam: Blood pressure 105/57, pulse 56,  temperature 97.8 F (36.6 C), temperature source Rectal, resp. rate 11, SpO2 100 %. Gen: No acute distress. Head: Normocephalic, atraumatic. Eyes: PERRL, EOMI, sclerae nonicteric. Mouth: Oropharynx clear. Neck: Supple, no thyromegaly, no lymphadenopathy, no jugular venous distention. Chest: Lungs diminished throughout. CV: Heart sounds are regular. No murmurs, rubs, or gallops. Abdomen: Soft, nontender, nondistended with normal active bowel sounds. Extremities: Extremities are without clubbing, edema, or cyanosis. Skin: Warm and dry. Neuro: Mildly lethargic, answers questions appropriately, nonfocal. Psych: Mood and affect depressed.   Data Review:    Labs: Basic Metabolic Panel:  Recent Labs Lab 07/23/15 1239  NA 118*  K 3.9  CL 89*  CO2 20*  GLUCOSE 181*  BUN 22*  CREATININE 1.57*  CALCIUM 8.2*   Liver Function Tests:  Recent Labs Lab 07/23/15 1239  AST 38  ALT 25  ALKPHOS  66  BILITOT 0.5  PROT 5.7*  ALBUMIN 3.5   CBC:  Recent Labs Lab 07/23/15 1239  WBC 2.9*  NEUTROABS 1.8  HGB 13.3  HCT 35.9*  MCV 75.1*  PLT 144*   Cardiac Enzymes:  Recent Labs Lab 07/23/15 1239  TROPONINI 0.03   CBG:  Recent Labs Lab 07/23/15 1200  GLUCAP 202*    Radiographic Studies: Dg Chest Port 1 View  07/23/2015   CLINICAL DATA:  Chest pain and vomiting.  EXAM: PORTABLE CHEST - 1 VIEW  COMPARISON:  06/24/2015  FINDINGS: Both lungs are clear. Heart and mediastinum are within normal limits. The trachea is midline. No acute bone abnormality.  IMPRESSION: No active disease.   Electronically Signed   By: Richarda Overlie M.D.   On: 07/23/2015 13:27   EKG: Independently reviewed. Sinus bradycardia of 54 bpm. No ischemic changes appreciated.   Assessment/Plan:   Principal Problem:   Syncope and collapse/bradycardia in the setting of polysubstance abuse including alcohol and cocaine abuse/dependence - The patient's presenting symptoms are similar to a recent admission  where his symptoms were felt to be due to polysubstance abuse. May have had a vasovagal event given acute bradycardia with position change. - The patient endorses recent cocaine abuse and ongoing alcohol abuse. - Admit to telemetry and cycle cardiac markers. - Hydrate.  Active Problems:   Leukopenia/thrombocytopenia - Mild, likely related to toxic effects of alcohol on bone marrow. Monitor.    Diabetes mellitus, type 2 - Check hemoglobin A1c. - Continue Lantus 25 units daily at bedtime, add SSI moderate scale Q AC/HS.    Hypertension - Does not appear to be on any home antihypertensives. - Blood pressure not elevated on admission. Monitor.    Coronary artery disease - Status post PTCA and stenting of LAD and distal right coronary artery. - Continue aspirin. - Monitor on telemetry and cycle cardiac markers.    Acute on chronic renal failure/CKD (chronic kidney disease), stage II - Baseline creatinine appears to be around 1.22. Hydrate and monitor.    Hyponatremia - Likely from alcohol/beer drinkers potomania. - Hydrate and monitor sodium.    Nausea and vomiting - Likely from polysubstance abuse and hyponatremia. Hydrate. Provide antinausea medications as needed.    Hypothermia - Continue heating blanket.    DVT prophylaxis - Lovenox ordered.  Code Status: Full. Family Communication: No family currently at the bedside. Disposition Plan: Home when stable, likely 1-2 days depending on resolution of hyponatremia.  Time spent: One hour.  RAMA,CHRISTINA [facility] Pager [phone] Cell: [phone]   If 7PM-7AM, please contact night-coverage [email or web address] Password [facility] 07/23/2015, 2:31 PM

## 2015-07-23 NOTE — ED Notes (Addendum)
Per ems pt is from home, reports being sick for a couple days, vomiting x2 days, reports dizziness, decreased PO intake. Around 0700 pt started having chest pain. Ems called around 1030, pt found severally hypotensive. Pt stood up and then sat down, then passed out, HR 26 during syncopal episode. 2 IVs started. 18 R forearm, 20 R AC. 500 NS infused, now 1500 hanging by ems.   Type 1 diabetic, rectal temp 96 degrees.

## 2015-07-23 NOTE — ED Notes (Signed)
Bed: RESA Expected date:  Expected time:  Means of arrival:  Comments: Ems- hypotensive 

## 2015-07-23 NOTE — ED Notes (Signed)
Pt can go at 15:00 if they can't take sooner.

## 2015-07-24 DIAGNOSIS — I2510 Atherosclerotic heart disease of native coronary artery without angina pectoris: Secondary | ICD-10-CM

## 2015-07-24 DIAGNOSIS — T68XXXD Hypothermia, subsequent encounter: Secondary | ICD-10-CM

## 2015-07-24 DIAGNOSIS — E11649 Type 2 diabetes mellitus with hypoglycemia without coma: Secondary | ICD-10-CM

## 2015-07-24 DIAGNOSIS — F1020 Alcohol dependence, uncomplicated: Secondary | ICD-10-CM

## 2015-07-24 LAB — GLUCOSE, CAPILLARY
GLUCOSE-CAPILLARY: 143 mg/dL — AB (ref 65–99)
GLUCOSE-CAPILLARY: 43 mg/dL — AB (ref 65–99)
GLUCOSE-CAPILLARY: 218 mg/dL — AB (ref 65–99)
GLUCOSE-CAPILLARY: 105 mg/dL — AB (ref 65–99)

## 2015-07-24 LAB — BASIC METABOLIC PANEL
ANION GAP: 5 (ref 5–15)
BUN: 26 mg/dL — AB (ref 6–20)
CO2: 21 mmol/L — AB (ref 22–32)
Calcium: 8.8 mg/dL — ABNORMAL LOW (ref 8.9–10.3)
Chloride: 101 mmol/L (ref 101–111)
Creatinine, Ser: 1.51 mg/dL — ABNORMAL HIGH (ref 0.61–1.24)
GFR calc Af Amer: 58 mL/min — ABNORMAL LOW (ref >60)
GFR calc non Af Amer: 50 mL/min — ABNORMAL LOW (ref >60)
GLUCOSE: 146 mg/dL — AB (ref 65–99)
POTASSIUM: 4.5 mmol/L (ref 3.5–5.1)
Sodium: 127 mmol/L — ABNORMAL LOW (ref 135–145)

## 2015-07-24 LAB — TROPONIN I
TROPONIN I: 0.03 ng/mL (ref <0.031)
Troponin I: 0.03 ng/mL (ref <0.031)

## 2015-07-24 LAB — CBC
HEMATOCRIT: 34.2 % — AB (ref 39.0–52.0)
HEMOGLOBIN: 12.8 g/dL — AB (ref 13.0–17.0)
MCH: 28.2 pg (ref 26.0–34.0)
MCHC: 37.4 g/dL — AB (ref 30.0–36.0)
MCV: 75.3 fL — AB (ref 78.0–100.0)
Platelets: 130 10*3/uL — ABNORMAL LOW (ref 150–400)
RBC: 4.54 MIL/uL (ref 4.22–5.81)
RDW: 13.1 % (ref 11.5–15.5)
WBC: 5 10*3/uL (ref 4.0–10.5)

## 2015-07-24 LAB — HEMOGLOBIN A1C
HEMOGLOBIN A1C: 8.4 % — AB (ref 4.8–5.6)
Mean Plasma Glucose: 194 mg/dL

## 2015-07-24 NOTE — Clinical Social Work Note (Signed)
Clinical Social Work Assessment  Patient Details  Name: Jose [name] MRN: [id number] Date of Birth: 02-Dec-1960  Date of referral:  07/24/15               Reason for consult:  Substance Use/ETOH Abuse                Permission sought to share information with:  Chartered certified accountant granted to share information::  Yes, Verbal Permission Granted  Name::        Agency::     Relationship::     Contact Information:     Housing/Transportation Living arrangements for the past 2 months:  Single Family Home Source of Information:  Patient Patient Interpreter Needed:  None Criminal Activity/Legal Involvement Pertinent to Current Situation/Hospitalization:  No - Comment as needed Significant Relationships:  Spouse Lives with:  Spouse Do you feel safe going back to the place where you live?  Yes Need for family participation in patient care:  No (Coment)  Care giving concerns:  CSW received consult that patient is requesting to be admitted to [facility].    Social Worker assessment / plan:  CSW met with patient to confirm plans - patient reported alcohol & cocaine abuse, that he had been to [employer] [phone]) about 6 years ago and had been clean since.   Employment status:  Therapist, music:  Managed Care PT Recommendations:  [place] / Referral to community resources:  Residential Substance Abuse Treatment Options, Outpatient Substance Abuse Treatment Options ([facility]))  Patient/Family's Response to care:  CSW left voicemail for [facility] with [facility], awaiting call back re: admission.   Patient/Family's Understanding of and Emotional Response to Diagnosis, Current Treatment, and Prognosis:  Patient admits that he has an alcohol & cocaine problem - (see SBIRT under Flowsheets). Patient reports that he drinks a 6 pack of beer a day and cocaine daily.   Emotional  Assessment Appearance:  Appears stated age Attitude/Demeanor/Rapport:    Affect (typically observed):  Accepting, Pleasant, Calm Orientation:  Oriented to Self, Oriented to Place, Oriented to  Time, Oriented to Situation Alcohol / Substance use:  Alcohol Use, Illicit Drugs Psych involvement (Current and /or in the community):  No (Comment)  Discharge Needs  Concerns to be addressed:    Readmission within the last 30 days:    Current discharge risk:    Barriers to Discharge:      Standley Brooking, LCSW 07/24/2015, 11:51 AM

## 2015-07-24 NOTE — Progress Notes (Signed)
Progress Note   Jose [name] ZOX:[id number] DOB: 1961/09/15 DOA: 07/31/2015 PCP: Pcp Not In System [facility], Dr. Westley Hummer (fax: [phone])   Brief Narrative:   Jose [name] is an 54 y.o. male with a PMH of DM, HTN, asthma, pancreatitis, CAD s/p PCI RCA, polysubstance abuse including alcohol and cocaine was brought to the hospital via EMS after experiencing 2 days of nausea, vomiting, dizziness with decreased oral intake. Patient reported chest pain at 7 AM and ultimately called EMS around 10:30 where he was found to be hypotensive. He had a syncopal event when EMS arrived and his heart rate was reportedly 26 at that time. His rectal temperature was 56F. He was given a fluid bolus by EMS and transported to the ED for further evaluation. Upon initial evaluation in the ED, the patient was noted to have significant hyponatremia. His presentation is similar to his presentation on 05/12/15 where he was admitted with similar symptoms. His symptoms seem to have been triggered by alcohol and cocaine ingestion during that admission.   Assessment/Plan:   Principal Problem:  Syncope and collapse/bradycardia in the setting of polysubstance abuse including alcohol and cocaine abuse/dependence - The patient's presenting symptoms are similar to a recent admission where his symptoms were felt to be due to polysubstance abuse. May have had a vasovagal event given acute bradycardia with position change. - The patient endorses recent cocaine abuse and ongoing alcohol abuse. UDS positive for cocaine. - Troponins negative 4. - With 3 hospitalizations in the past 6 months, recommend drug rehab.  - Attempted to discuss with his PCP and left message with at his office. - Social worker attempting to facilitate rehabilitation treatment options at [place].   Active Problems:  Leukopenia/thrombocytopenia - Mild, likely related to toxic effects of alcohol on bone marrow. WBC WNL this morning, but  platelets still slightly low.   Diabetes mellitus, type 2 - Hemoglobin A1c 8.4% corresponding to a mean plasma glucose of 194. - Had a hypoglycemic episode last night, and again this afternoon. Discontinue Lantus and meal coverage.    Hypertension - Does not appear to be on any home antihypertensives. - Blood pressure not elevated on admission. Monitor.   Coronary artery disease - Status post PTCA and stenting of LAD and distal right coronary artery. - Continue aspirin. Troponins negative 4.   Acute on chronic renal failure/CKD (chronic kidney disease), stage II - Baseline creatinine appears to be around 1.22. Hydrate and monitor.   Hyponatremia - Likely from alcohol/beer drinkers potomania. - Sodium improved to 127.   Nausea and vomiting - Likely from polysubstance abuse and hyponatremia. Hydrate. Provide antinausea medications as needed.   Hypothermia - Resolved with heating blanket.   DVT prophylaxis - Lovenox ordered.  Code Status: Full. Family Communication: No family currently at the bedside. declines my offer to call.  Disposition Plan: Home when stable, likely 8/31/16ending on resolution of hyponatremia.   IV Access:    Peripheral IV   Procedures and diagnostic studies:   Dg Chest Port 1 View  07/31/2015   CLINICAL DATA:  Chest pain and vomiting.  EXAM: PORTABLE CHEST - 1 VIEW  COMPARISON:  06/24/2015  FINDINGS: Both lungs are clear. Heart and mediastinum are within normal limits. The trachea is midline. No acute bone abnormality.  IMPRESSION: No active disease.   Electronically Signed   By: Richarda Overlie M.D.   On: 31-Jul-2015 13:27     Medical Consultants:    None.  Anti-Infectives:  None.  Subjective:   Jose [name] reports feeling dizzy.  Otherwise, he feels okay. Nausea has improved. States that he does want rehabilitation and that he was sober for 6 years after his first attempt at rehabilitation.   Objective:    Filed Vitals:    07/23/15 1600 07/23/15 1733 07/23/15 2226 07/24/15 0518  BP:  121/57 111/56 117/64  Pulse:  59 62 56  Temp:   98.3 F (36.8 C) 97.8 F (36.6 C)  TempSrc:   Oral Oral  Resp:   16 16  Height: 5\' 11"  (1.803 m)     Weight: 78.472 kg (173 lb)     SpO2:  97% 97% 100%    Intake/Output Summary (Last 24 hours) at 07/24/15 0811 Last data filed at 07/24/15 0700  Gross per 24 hour  Intake 5752.5 ml  Output   4125 ml  Net 1627.5 ml    Exam: Gen:  NAD Cardiovascular:  RRR, No M/R/G Respiratory:  Lungs CTAB Gastrointestinal:  Abdomen soft, NT/ND, + BS Extremities:  No C/E/C   Data Reviewed:    Labs: Basic Metabolic Panel:  Recent Labs Lab 07/23/15 1239 07/24/15 0629  NA 118* 127*  K 3.9 4.5  CL 89* 101  CO2 20* 21*  GLUCOSE 181* 146*  BUN 22* 26*  CREATININE 1.57* 1.51*  CALCIUM 8.2* 8.8*   GFR Estimated Creatinine Clearance: 58.2 mL/min (by C-G formula based on Cr of 1.51). Liver Function Tests:  Recent Labs Lab 07/23/15 1239  AST 38  ALT 25  ALKPHOS 66  BILITOT 0.5  PROT 5.7*  ALBUMIN 3.5   CBC:  Recent Labs Lab 07/23/15 1239 07/24/15 0629  WBC 2.9* 5.0  NEUTROABS 1.8  --   HGB 13.3 12.8*  HCT 35.9* 34.2*  MCV 75.1* 75.3*  PLT 144* 130*   Cardiac Enzymes:  Recent Labs Lab 07/23/15 1239 07/23/15 1845 07/24/15 0045 07/24/15 0629  TROPONINI 0.03 0.03 0.03 0.03   CBG:  Recent Labs Lab 07/23/15 1200 07/23/15 1807 07/23/15 2149 07/23/15 2225 07/24/15 0728  GLUCAP 202* 328* 66 146* 143*   Hgb A1c:  Recent Labs  07/23/15 1239  HGBA1C 8.4*   Sepsis Labs:  Recent Labs Lab 07/23/15 1239 07/23/15 1250 07/24/15 0629  WBC 2.9*  --  5.0  LATICACIDVEN  --  1.11  --    Microbiology No results found for this or any previous visit (from the past 240 hour(s)).   Medications:   . aspirin EC  81 mg Oral Daily  . brimonidine  1 drop Both Eyes Daily  . budesonide-formoterol  2 puff Inhalation BID  . enoxaparin (LOVENOX) injection   40 mg Subcutaneous Q24H  . folic acid  1 mg Oral Daily  . insulin aspart  0-15 Units Subcutaneous TID WC  . insulin aspart  0-5 Units Subcutaneous QHS  . insulin aspart  4 Units Subcutaneous TID WC  . insulin glargine  15 Units Subcutaneous QHS  . ipratropium-albuterol  3 mL Nebulization Once  . latanoprost  1 drop Both Eyes QHS  . LORazepam  0-4 mg Oral Q6H   Followed by  . [START ON 07/25/2015] LORazepam  0-4 mg Oral Q12H  . multivitamin with minerals  1 tablet Oral Daily  . omega-3 acid ethyl esters  1 g Oral Daily  . sodium chloride  3 mL Intravenous Q12H  . thiamine  100 mg Oral Daily   Or  . thiamine  100 mg Intravenous Daily  . traZODone  100 mg  Oral QHS   Continuous Infusions: . sodium chloride 125 mL/hr at 07/24/15 0648    Time spent: 25 minutes.   LOS: 1 day   Jose [name]  [facility] Pager [phone]. If unable to reach me by pager, please call my cell phone at [phone].  *Please refer to [email or web address], password [email or web address] to get updated schedule on who will round on this patient, as hospitalists switch teams weekly. If 7PM-7AM, please contact night-coverage at [email or web address], password [email or web address] for any overnight needs.  07/24/2015, 8:11 AM

## 2015-07-24 NOTE — Progress Notes (Signed)
Hypoglycemic Event  CBG: 43 at 1110  Treatment: Snack & OJ  Symptoms: Sweating  Follow-up CBG: Time:1130 CBG Result:134  Possible Reasons for Event: Insulin dose   Comments/MD notified: RAMA present, and gave new orders      Lenox Ponds 07/24/2015  Remember to initiate Hypoglycemia Order Set & complete

## 2015-07-25 DIAGNOSIS — F1010 Alcohol abuse, uncomplicated: Secondary | ICD-10-CM

## 2015-07-25 DIAGNOSIS — R55 Syncope and collapse: Secondary | ICD-10-CM

## 2015-07-25 DIAGNOSIS — G9340 Encephalopathy, unspecified: Secondary | ICD-10-CM

## 2015-07-25 LAB — BASIC METABOLIC PANEL
Anion gap: 7 (ref 5–15)
BUN: 22 mg/dL — ABNORMAL HIGH (ref 6–20)
CHLORIDE: 102 mmol/L (ref 101–111)
CO2: 21 mmol/L — AB (ref 22–32)
CREATININE: 1.46 mg/dL — AB (ref 0.61–1.24)
Calcium: 8.9 mg/dL (ref 8.9–10.3)
GFR calc non Af Amer: 52 mL/min — ABNORMAL LOW (ref >60)
Glucose, Bld: 258 mg/dL — ABNORMAL HIGH (ref 65–99)
Potassium: 4.4 mmol/L (ref 3.5–5.1)
Sodium: 130 mmol/L — ABNORMAL LOW (ref 135–145)

## 2015-07-25 LAB — GLUCOSE, CAPILLARY
Glucose-Capillary: 197 mg/dL — ABNORMAL HIGH (ref 65–99)
Glucose-Capillary: 171 mg/dL — ABNORMAL HIGH (ref 65–99)

## 2015-07-25 MED ORDER — FOLIC ACID 1 MG PO TABS: 1.0000 mg | ORAL_TABLET | Freq: Every day | ORAL | Status: DC

## 2015-07-25 MED ORDER — THIAMINE HCL 100 MG PO TABS: 100.0000 mg | ORAL_TABLET | Freq: Every day | ORAL | Status: DC

## 2015-07-25 MED ORDER — INSULIN ASPART 100 UNIT/ML ~~LOC~~ SOLN: 0.0000 [IU] | Freq: Three times a day (TID) | SUBCUTANEOUS | Status: DC

## 2015-07-25 MED ORDER — INSULIN ASPART 100 UNIT/ML ~~LOC~~ SOLN
0.0000 [IU] | Freq: Three times a day (TID) | SUBCUTANEOUS | Status: DC
  Administered 2015-07-25: 2 [IU] via SUBCUTANEOUS

## 2015-07-25 MED ORDER — INSULIN GLARGINE 100 UNIT/ML ~~LOC~~ SOLN: 10.0000 [IU] | Freq: Every day | SUBCUTANEOUS | Status: DC

## 2015-07-25 NOTE — Progress Notes (Signed)
Inpatient Diabetes Program Recommendations  AACE/ADA: New Consensus Statement on Inpatient Glycemic Control (2013)  Target Ranges:  Prepandial:   less than 140 mg/dL      Peak postprandial:   less than 180 mg/dL (1-2 hours)      Critically ill patients:  140 - 180 mg/dL   Reason for Visit: Hyperglycemia  Diabetes history: DM2 Outpatient Diabetes medications: Lantus 15 units QHS Current orders for Inpatient glycemic control: Novolog moderate tidwc and hs  Inpatient Diabetes Program Recommendations Insulin - Basal: Add Lantus 15 units QHS HgbA1C: 8.4% - may not be accurate with low H/H  Note: Will continue to follow. Thank you. Ailene Ards, RD, LDN, CDE Inpatient Diabetes Coordinator [phone]

## 2015-07-25 NOTE — Discharge Summary (Signed)
Physician Discharge Summary  Jose [name] ZOX:[id number] DOB: [birth year] DOA: 07/23/2015  PCP: Pcp Not In System  Admit date: 07/23/2015 Discharge date: 07/25/2015  Time spent: 30 minutes  Recommendations for Outpatient Follow-up:  1. Please evaluate to start antihypertensives for PCP.  2. Please get BMP checked in one week.  3. Follow up with rehab as soon as possible.   Discharge Diagnoses:  Principal Problem:   Syncope and collapse Active Problems:   Diabetes mellitus, type 2   Hypertension   Coronary artery disease   CKD (chronic kidney disease), stage II   Hyponatremia   Nausea and vomiting   Alcohol dependence   Cocaine abuse   Alcohol abuse   Acute on chronic renal failure   Hypothermia   Acute encephalopathy   Leukopenia   Thrombocytopenia   Discharge Condition: improved.   Diet recommendation: regular  Filed Weights   07/23/15 1600  Weight: 78.472 kg (173 lb)    History of present illness:   Jose [name] is an 54 y.o. male with a PMH of DM, HTN, asthma, pancreatitis, CAD s/p PCI RCA, polysubstance abuse including alcohol and cocaine was brought to the hospital via EMS after experiencing 2 days of nausea, vomiting, dizziness with decreased oral intake. Patient reported chest pain at 7 AM and ultimately called EMS around 10:30 where he was found to be hypotensive. He had a syncopal event when EMS arrived and his heart rate was reportedly 26 at that time. His rectal temperature was 11F. He was given a fluid bolus by EMS and transported to the ED for further evaluation. Upon initial evaluation in the ED, the patient was noted to have significant hyponatremia. His presentation is similar to his presentation on 05/12/15 where he was admitted with similar symptoms. His symptoms seem to have been triggered by alcohol and cocaine ingestion during that admission.   Hospital Course:  Syncope and collapse/bradycardia in the setting of polysubstance abuse including alcohol and  cocaine abuse/dependence - The patient's presenting symptoms are similar to a recent admission where his symptoms were felt to be due to polysubstance abuse. May have had a vasovagal event given acute bradycardia with position change. - The patient endorses recent cocaine abuse and ongoing alcohol abuse. UDS positive for cocaine. - Troponins negative 4. - With 3 hospitalizations in the past 6 months, recommend drug rehab.  - Attempted to discuss with his PCP and left message with at his office. - Social worker attempting to facilitate rehabilitation treatment options at [place], and they recommended we discharge the patient and he will be seen for evalution in to the rehab center.   Active Problems:  Leukopenia/thrombocytopenia - Mild, likely related to toxic effects of alcohol on bone marrow. WBC WNL this morning, but platelets still slightly low.   Diabetes mellitus, type 2 - Hemoglobin A1c 8.4% corresponding to a mean plasma glucose of 194. - Had a hypoglycemic episode on 30th , decreased the dose of lantus on discharge.     Hypertension -SLIGHTLY high, would recommend to start on anti hypertensives at PCP office.    Coronary artery disease - Status post PTCA and stenting of LAD and distal right coronary artery. - Continue aspirin. Troponins negative 4.   Acute on chronic renal failure/CKD (chronic kidney disease), stage II - Baseline creatinine appears to be around 1.22. Hydrated, recommend checkign Bmp in one week.    Hyponatremia - Likely from alcohol/beer drinkers potomania. - Sodium improved to 130 asymptomatic. .   Nausea and vomiting -  Likely from polysubstance abuse and hyponatremia. Hydration and symptoms resolved.    Hypothermia - Resolved with heating blanket.  Procedures:  none  Consultations:  Social worker  Discharge Exam: Filed Vitals:   07/25/15 0948  BP: 159/78  Pulse: 56  Temp: 98 F (36.7 C)  Resp: 16    General: alert afebrile  comfortable Cardiovascular: s1s2 Respiratory: ctab  Discharge Instructions   Discharge Instructions    Diet Carb Modified    Complete by:  As directed      Discharge instructions    Complete by:  As directed   Please follow up with PCP in 1 week, and get BMP checked. We have changed  Your lantus dose and added on short acting insulin to your regimen.  You will need [facility]  [facility].          Current Discharge Medication List    START taking these medications   Details  folic acid (FOLVITE) 1 MG tablet Take 1 tablet (1 mg total) by mouth daily. Qty: 30 tablet, Refills: 0    insulin aspart (NOVOLOG) 100 UNIT/ML injection Inject 0-9 Units into the skin 3 (three) times daily with meals. Qty: 10 mL, Refills: 2    thiamine 100 MG tablet Take 1 tablet (100 mg total) by mouth daily. Qty: 30 tablet, Refills: 0      CONTINUE these medications which have CHANGED   Details  insulin glargine (LANTUS) 100 UNIT/ML injection Inject 0.1 mLs (10 Units total) into the skin at bedtime. Qty: 10 mL, Refills: 2      CONTINUE these medications which have NOT CHANGED   Details  albuterol (PROVENTIL HFA;VENTOLIN HFA) 108 (90 BASE) MCG/ACT inhaler Inhale 2 puffs into the lungs every 6 (six) hours as needed for wheezing or shortness of breath.     aspirin EC 81 MG tablet Take 81 mg by mouth daily.    brimonidine (ALPHAGAN) 0.2 % ophthalmic solution Place 1 drop into both eyes daily.     budesonide-formoterol (SYMBICORT) 160-4.5 MCG/ACT inhaler Inhale 2 puffs into the lungs 2 (two) times daily.    Cyanocobalamin (VITAMIN B-12 PO) Take by mouth daily.    IRON PO Take 1 tablet by mouth daily.    latanoprost (XALATAN) 0.005 % ophthalmic solution Place 1 drop into both eyes at bedtime.     Multiple Vitamin (MULTIVITAMIN WITH MINERALS) TABS tablet Take 1 tablet by mouth daily.     Omega-3 Fatty Acids (FISH OIL) 1000 MG CAPS Take 1,000 mg by mouth daily.     traZODone (DESYREL) 100 MG tablet Take 100 mg by mouth at bedtime.      STOP taking these medications     HYDROcodone-acetaminophen (NORCO/VICODIN) 5-325 MG per tablet      methocarbamol (ROBAXIN) 500 MG tablet        No Known Allergies    The results of significant diagnostics from this hospitalization (including imaging, microbiology, ancillary and laboratory) are listed below for reference.    Significant Diagnostic Studies: Dg Chest Port 1 View  07/23/2015   CLINICAL DATA:  Chest pain and vomiting.  EXAM: PORTABLE CHEST - 1 VIEW  COMPARISON:  06/24/2015  FINDINGS: Both lungs are clear. Heart and mediastinum are within normal limits. The trachea is midline. No acute bone abnormality.  IMPRESSION: No active disease.   Electronically Signed   By: Richarda Overlie M.D.   On:  07/23/2015 13:27    Microbiology: No results found for this or any previous visit (from the past 240 hour(s)).   Labs: Basic Metabolic Panel:  Recent Labs Lab 07/23/15 1239 07/24/15 0629 07/25/15 0952  NA 118* 127* 130*  K 3.9 4.5 4.4  CL 89* 101 102  CO2 20* 21* 21*  GLUCOSE 181* 146* 258*  BUN 22* 26* 22*  CREATININE 1.57* 1.51* 1.46*  CALCIUM 8.2* 8.8* 8.9   Liver Function Tests:  Recent Labs Lab 07/23/15 1239  AST 38  ALT 25  ALKPHOS 66  BILITOT 0.5  PROT 5.7*  ALBUMIN 3.5   No results for input(s): LIPASE, AMYLASE in the last 168 hours. No results for input(s): AMMONIA in the last 168 hours. CBC:  Recent Labs Lab 07/23/15 1239 07/24/15 0629  WBC 2.9* 5.0  NEUTROABS 1.8  --   HGB 13.3 12.8*  HCT 35.9* 34.2*  MCV 75.1* 75.3*  PLT 144* 130*   Cardiac Enzymes:  Recent Labs Lab 07/23/15 1239 07/23/15 1845 07/24/15 0045 07/24/15 0629  TROPONINI 0.03 0.03 0.03 0.03   BNP: BNP (last 3 results) No results for input(s): BNP in the last 8760 hours.  ProBNP (last 3 results) No results for  input(s): PROBNP in the last 8760 hours.  CBG:  Recent Labs Lab 07/24/15 0728 07/24/15 1110 07/24/15 1652 07/24/15 2110 07/25/15 0737  GLUCAP 143* 43* 218* 105* 197*       Signed:  Lenzie Montesano  [facility] 07/25/2015, 11:42 AM    '

## 2015-07-25 NOTE — Progress Notes (Signed)
CSW provided patient with social worker, Guadalupe Maple number to contact & setup an intake appointment (ph#: [phone]) at the [facility] in [place] before patient can be referred [facility] [phone]. Patient left voicemail for Tiffany while CSW was in room. Patient provided bus passes.   No further CSW needs identified - CSW signing off.   Lincoln Maxin, LCSW [facility] Clinical Social Worker cell #: [phone]

## 2015-10-17 ENCOUNTER — Encounter (HOSPITAL_COMMUNITY): Payer: Self-pay | Admitting: Emergency Medicine

## 2015-10-17 ENCOUNTER — Inpatient Hospital Stay (HOSPITAL_COMMUNITY)
Admission: EM | Admit: 2015-10-17 | Discharge: 2015-10-18 | DRG: 638 | Disposition: A | Discharge status: Home / Self Care | Payer: Non-veteran care | Attending: Internal Medicine | Admitting: Internal Medicine

## 2015-10-17 DIAGNOSIS — E1159 Type 2 diabetes mellitus with other circulatory complications: Secondary | ICD-10-CM | POA: Diagnosis present

## 2015-10-17 DIAGNOSIS — J45909 Unspecified asthma, uncomplicated: Secondary | ICD-10-CM | POA: Diagnosis present

## 2015-10-17 DIAGNOSIS — Z794 Long term (current) use of insulin: Secondary | ICD-10-CM

## 2015-10-17 DIAGNOSIS — Z79899 Other long term (current) drug therapy: Secondary | ICD-10-CM | POA: Diagnosis not present

## 2015-10-17 DIAGNOSIS — F1010 Alcohol abuse, uncomplicated: Secondary | ICD-10-CM | POA: Diagnosis present

## 2015-10-17 DIAGNOSIS — Z8249 Family history of ischemic heart disease and other diseases of the circulatory system: Secondary | ICD-10-CM

## 2015-10-17 DIAGNOSIS — E1122 Type 2 diabetes mellitus with diabetic chronic kidney disease: Secondary | ICD-10-CM | POA: Diagnosis present

## 2015-10-17 DIAGNOSIS — R739 Hyperglycemia, unspecified: Secondary | ICD-10-CM | POA: Diagnosis present

## 2015-10-17 DIAGNOSIS — I129 Hypertensive chronic kidney disease with stage 1 through stage 4 chronic kidney disease, or unspecified chronic kidney disease: Secondary | ICD-10-CM | POA: Diagnosis present

## 2015-10-17 DIAGNOSIS — E118 Type 2 diabetes mellitus with unspecified complications: Secondary | ICD-10-CM

## 2015-10-17 DIAGNOSIS — N183 Chronic kidney disease, stage 3 unspecified: Secondary | ICD-10-CM | POA: Diagnosis present

## 2015-10-17 DIAGNOSIS — I2510 Atherosclerotic heart disease of native coronary artery without angina pectoris: Secondary | ICD-10-CM | POA: Diagnosis present

## 2015-10-17 DIAGNOSIS — T464X5A Adverse effect of angiotensin-converting-enzyme inhibitors, initial encounter: Secondary | ICD-10-CM | POA: Diagnosis present

## 2015-10-17 DIAGNOSIS — E1121 Type 2 diabetes mellitus with diabetic nephropathy: Secondary | ICD-10-CM | POA: Diagnosis not present

## 2015-10-17 DIAGNOSIS — F329 Major depressive disorder, single episode, unspecified: Secondary | ICD-10-CM | POA: Diagnosis present

## 2015-10-17 DIAGNOSIS — I10 Essential (primary) hypertension: Secondary | ICD-10-CM | POA: Diagnosis present

## 2015-10-17 DIAGNOSIS — E119 Type 2 diabetes mellitus without complications: Secondary | ICD-10-CM

## 2015-10-17 DIAGNOSIS — E1165 Type 2 diabetes mellitus with hyperglycemia: Principal | ICD-10-CM | POA: Diagnosis present

## 2015-10-17 DIAGNOSIS — E875 Hyperkalemia: Secondary | ICD-10-CM | POA: Diagnosis present

## 2015-10-17 DIAGNOSIS — Z955 Presence of coronary angioplasty implant and graft: Secondary | ICD-10-CM

## 2015-10-17 DIAGNOSIS — Z7982 Long term (current) use of aspirin: Secondary | ICD-10-CM | POA: Diagnosis not present

## 2015-10-17 DIAGNOSIS — E785 Hyperlipidemia, unspecified: Secondary | ICD-10-CM | POA: Diagnosis present

## 2015-10-17 DIAGNOSIS — N179 Acute kidney failure, unspecified: Secondary | ICD-10-CM | POA: Diagnosis not present

## 2015-10-17 DIAGNOSIS — E871 Hypo-osmolality and hyponatremia: Secondary | ICD-10-CM | POA: Diagnosis present

## 2015-10-17 DIAGNOSIS — Z87891 Personal history of nicotine dependence: Secondary | ICD-10-CM

## 2015-10-17 DIAGNOSIS — I152 Hypertension secondary to endocrine disorders: Secondary | ICD-10-CM | POA: Diagnosis present

## 2015-10-17 DIAGNOSIS — F1410 Cocaine abuse, uncomplicated: Secondary | ICD-10-CM | POA: Diagnosis present

## 2015-10-17 DIAGNOSIS — E1169 Type 2 diabetes mellitus with other specified complication: Secondary | ICD-10-CM | POA: Diagnosis present

## 2015-10-17 LAB — RAPID URINE DRUG SCREEN, HOSP PERFORMED
Amphetamines: NOT DETECTED
Barbiturates: NOT DETECTED
Benzodiazepines: NOT DETECTED
Cocaine: NOT DETECTED
OPIATES: NOT DETECTED
Tetrahydrocannabinol: NOT DETECTED

## 2015-10-17 LAB — URINALYSIS, ROUTINE W REFLEX MICROSCOPIC
Bilirubin Urine: NEGATIVE
Hgb urine dipstick: NEGATIVE
Ketones, ur: NEGATIVE mg/dL
LEUKOCYTES UA: NEGATIVE
NITRITE: NEGATIVE
PH: 5.5 (ref 5.0–8.0)
Protein, ur: NEGATIVE mg/dL
SPECIFIC GRAVITY, URINE: 1.026 (ref 1.005–1.030)

## 2015-10-17 LAB — BLOOD GAS, VENOUS
Acid-base deficit: 2.4 mmol/L — ABNORMAL HIGH (ref 0.0–2.0)
Bicarbonate: 22.7 mEq/L (ref 20.0–24.0)
O2 Saturation: 77.1 %
PCO2 VEN: 42.2 mmHg — AB (ref 45.0–50.0)
PH VEN: 7.35 — AB (ref 7.250–7.300)
Patient temperature: 98.6
TCO2: 20.2 mmol/L (ref 0–100)
pO2, Ven: 46.2 mmHg — ABNORMAL HIGH (ref 30.0–45.0)

## 2015-10-17 LAB — PROTIME-INR
INR: 1 (ref 0.00–1.49)
PROTHROMBIN TIME: 13.4 s (ref 11.6–15.2)

## 2015-10-17 LAB — BASIC METABOLIC PANEL
ANION GAP: 10 (ref 5–15)
Anion gap: 8 (ref 5–15)
BUN: 40 mg/dL — AB (ref 6–20)
BUN: 41 mg/dL — AB (ref 6–20)
CALCIUM: 9.4 mg/dL (ref 8.9–10.3)
CHLORIDE: 95 mmol/L — AB (ref 101–111)
CO2: 23 mmol/L (ref 22–32)
CO2: 22 mmol/L (ref 22–32)
Calcium: 9.4 mg/dL (ref 8.9–10.3)
Chloride: 102 mmol/L (ref 101–111)
Creatinine, Ser: 1.94 mg/dL — ABNORMAL HIGH (ref 0.61–1.24)
Creatinine, Ser: 1.81 mg/dL — ABNORMAL HIGH (ref 0.61–1.24)
GFR calc Af Amer: 43 mL/min — ABNORMAL LOW (ref >60)
GFR calc Af Amer: 47 mL/min — ABNORMAL LOW (ref >60)
GFR calc non Af Amer: 37 mL/min — ABNORMAL LOW (ref >60)
GFR, EST NON AFRICAN AMERICAN: 40 mL/min — AB (ref >60)
GLUCOSE: 575 mg/dL — AB (ref 65–99)
Glucose, Bld: 739 mg/dL (ref 65–99)
POTASSIUM: 5.2 mmol/L — AB (ref 3.5–5.1)
Potassium: 4.9 mmol/L (ref 3.5–5.1)
SODIUM: 128 mmol/L — AB (ref 135–145)
Sodium: 132 mmol/L — ABNORMAL LOW (ref 135–145)

## 2015-10-17 LAB — CBC
HEMATOCRIT: 41.1 % (ref 39.0–52.0)
HEMOGLOBIN: 14.5 g/dL (ref 13.0–17.0)
MCH: 28.1 pg (ref 26.0–34.0)
MCHC: 35.3 g/dL (ref 30.0–36.0)
MCV: 79.7 fL (ref 78.0–100.0)
Platelets: 157 10*3/uL (ref 150–400)
RBC: 5.16 MIL/uL (ref 4.22–5.81)
RDW: 13.7 % (ref 11.5–15.5)
WBC: 4.4 10*3/uL (ref 4.0–10.5)

## 2015-10-17 LAB — URINE MICROSCOPIC-ADD ON: RBC / HPF: NONE SEEN RBC/hpf (ref 0–5)

## 2015-10-17 LAB — CBG MONITORING, ED
GLUCOSE-CAPILLARY: 275 mg/dL — AB (ref 65–99)
Glucose-Capillary: >600 mg/dL (ref 65–99)

## 2015-10-17 LAB — PHOSPHORUS: Phosphorus: 4.7 mg/dL — ABNORMAL HIGH (ref 2.5–4.6)

## 2015-10-17 LAB — GLUCOSE, CAPILLARY
GLUCOSE-CAPILLARY: 96 mg/dL (ref 65–99)
Glucose-Capillary: 224 mg/dL — ABNORMAL HIGH (ref 65–99)

## 2015-10-17 LAB — ETHANOL

## 2015-10-17 LAB — MAGNESIUM: MAGNESIUM: 2 mg/dL (ref 1.7–2.4)

## 2015-10-17 LAB — APTT: APTT: 26 s (ref 24–37)

## 2015-10-17 MED ORDER — DOXAZOSIN MESYLATE 4 MG PO TABS
4.0000 mg | ORAL_TABLET | Freq: Every day | ORAL | Status: DC
  Administered 2015-10-17: 4 mg via ORAL
  Filled 2015-10-17 (×2): qty 1

## 2015-10-17 MED ORDER — ALBUTEROL SULFATE (2.5 MG/3ML) 0.083% IN NEBU: 3.0000 mL | INHALATION_SOLUTION | Freq: Four times a day (QID) | RESPIRATORY_TRACT | Status: DC | PRN

## 2015-10-17 MED ORDER — VITAMIN B-12 100 MCG PO TABS
100.0000 ug | ORAL_TABLET | Freq: Every day | ORAL | Status: DC
  Administered 2015-10-18: 100 ug via ORAL
  Filled 2015-10-17: qty 1

## 2015-10-17 MED ORDER — INSULIN ASPART 100 UNIT/ML ~~LOC~~ SOLN
10.0000 [IU] | Freq: Once | SUBCUTANEOUS | Status: AC
  Administered 2015-10-17: 10 [IU] via SUBCUTANEOUS
  Filled 2015-10-17: qty 1

## 2015-10-17 MED ORDER — SODIUM CHLORIDE 0.9 % IV BOLUS (SEPSIS)
2000.0000 mL | Freq: Once | INTRAVENOUS | Status: AC
  Administered 2015-10-17: 2000 mL via INTRAVENOUS

## 2015-10-17 MED ORDER — INSULIN GLARGINE 100 UNIT/ML ~~LOC~~ SOLN
30.0000 [IU] | Freq: Every day | SUBCUTANEOUS | Status: DC
  Administered 2015-10-18: 30 [IU] via SUBCUTANEOUS
  Filled 2015-10-17 (×2): qty 0.3

## 2015-10-17 MED ORDER — FERROUS SULFATE 325 (65 FE) MG PO TABS
325.0000 mg | ORAL_TABLET | Freq: Every day | ORAL | Status: DC
  Administered 2015-10-18: 325 mg via ORAL
  Filled 2015-10-17 (×2): qty 1

## 2015-10-17 MED ORDER — HYDRALAZINE HCL 20 MG/ML IJ SOLN: 5.0000 mg | Freq: Four times a day (QID) | INTRAMUSCULAR | Status: DC | PRN

## 2015-10-17 MED ORDER — INSULIN ASPART 100 UNIT/ML ~~LOC~~ SOLN
0.0000 [IU] | Freq: Three times a day (TID) | SUBCUTANEOUS | Status: DC
  Administered 2015-10-17 – 2015-10-18 (×2): 5 [IU] via SUBCUTANEOUS

## 2015-10-17 MED ORDER — OMEGA-3-ACID ETHYL ESTERS 1 G PO CAPS
1.0000 g | ORAL_CAPSULE | Freq: Every day | ORAL | Status: DC
  Administered 2015-10-18: 1 g via ORAL
  Filled 2015-10-17: qty 1

## 2015-10-17 MED ORDER — LATANOPROST 0.005 % OP SOLN
1.0000 [drp] | Freq: Every day | OPHTHALMIC | Status: DC
  Administered 2015-10-17: 1 [drp] via OPHTHALMIC
  Filled 2015-10-17: qty 2.5

## 2015-10-17 MED ORDER — ACETAMINOPHEN 650 MG RE SUPP: 650.0000 mg | Freq: Four times a day (QID) | RECTAL | Status: DC | PRN

## 2015-10-17 MED ORDER — BUDESONIDE-FORMOTEROL FUMARATE 160-4.5 MCG/ACT IN AERO
2.0000 | INHALATION_SPRAY | Freq: Two times a day (BID) | RESPIRATORY_TRACT | Status: DC
  Administered 2015-10-17 – 2015-10-18 (×2): 2 via RESPIRATORY_TRACT
  Filled 2015-10-17: qty 6

## 2015-10-17 MED ORDER — SODIUM CHLORIDE 0.9 % IV SOLN
INTRAVENOUS | Status: DC
  Administered 2015-10-17 – 2015-10-18 (×2): via INTRAVENOUS

## 2015-10-17 MED ORDER — INSULIN ASPART 100 UNIT/ML ~~LOC~~ SOLN: 0.0000 [IU] | Freq: Every day | SUBCUTANEOUS | Status: DC

## 2015-10-17 MED ORDER — TRAZODONE HCL 150 MG PO TABS
150.0000 mg | ORAL_TABLET | Freq: Every day | ORAL | Status: DC
  Administered 2015-10-17: 150 mg via ORAL
  Filled 2015-10-17 (×2): qty 1

## 2015-10-17 MED ORDER — ASPIRIN EC 81 MG PO TBEC
81.0000 mg | DELAYED_RELEASE_TABLET | Freq: Every day | ORAL | Status: DC
  Administered 2015-10-18: 81 mg via ORAL
  Filled 2015-10-17: qty 1

## 2015-10-17 MED ORDER — ACETAMINOPHEN 325 MG PO TABS: 650.0000 mg | ORAL_TABLET | Freq: Four times a day (QID) | ORAL | Status: DC | PRN

## 2015-10-17 MED ORDER — SODIUM CHLORIDE 0.9 % IV SOLN: INTRAVENOUS | Status: DC

## 2015-10-17 MED ORDER — FOLIC ACID 1 MG PO TABS
1.0000 mg | ORAL_TABLET | Freq: Every day | ORAL | Status: DC
Start: 2015-10-18 — End: 2015-10-18
  Administered 2015-10-18: 1 mg via ORAL
  Filled 2015-10-17: qty 1

## 2015-10-17 MED ORDER — BRIMONIDINE TARTRATE 0.2 % OP SOLN
1.0000 [drp] | Freq: Two times a day (BID) | OPHTHALMIC | Status: DC
  Administered 2015-10-17 – 2015-10-18 (×2): 1 [drp] via OPHTHALMIC
  Filled 2015-10-17: qty 5

## 2015-10-17 NOTE — Progress Notes (Signed)
Inpatient Diabetes Program Recommendations  AACE/ADA: New Consensus Statement on Inpatient Glycemic Control (2015)  Target Ranges:  Prepandial:   less than 140 mg/dL      Peak postprandial:   less than 180 mg/dL (1-2 hours)      Critically ill patients:  140 - 180 mg/dL    Results for Jose [name], [name] (MRN [id number]) as of 10/17/2015 13:15  Ref. Range 10/17/2015 12:00  Glucose Latest Ref Range: 65-99 mg/dL 409739 (HH)    Admit: Patient here complaining of increased blood glucose. Just completed a 30 day substance abuse treatment program where he says blood sugars running 300s. Went for outpatient follow-up today was told to come here for further workup.   Endorses polyuria and polydipsia. States that his regular dose of insulin was changed when he was in treatment.   History: DM, HTN, CKD, Polysubstance Abuse (positive for cocaine last admission in August)  Home DM Meds: Lantus 30 units daily       Novolog SSI (not taking per patient- was given Rx for Novolog SSI during last admission (07/25/15) by Dr. Blake DivineAkula     -Note patient to be admitted by Hospitalist.  -Given 2L NS bolus and to receive 10 units Novolog in the ED.  -Note patient likely not in DKA on admit- Glucose 739 mg/dl, CO2 23, Anion Gap 10.    --Will follow patient during hospitalization--  Ambrose FinlandJeannine Johnston Nitya Cauthon RN, MSN, CDE Diabetes Coordinator Inpatient Glycemic Control Team Team Pager: [phone] (8a-5p)

## 2015-10-17 NOTE — ED Notes (Signed)
Attempted to call report but was told to call back since bed was just assigned to them

## 2015-10-17 NOTE — H&P (Signed)
[facility] History and Physical  Jose [name] ZOX:[id number] DOB: 04-14-1961 DOA: 10/17/2015  Referring physician: ER physician: Dr. Lorre Nick  [facility]  Chief Complaint: high blood glucose  HPI:  54 year old male with past medical history of substance abuse, diabetes, insulin dependent, hypertension, dyslipidemia who presented to [facility] ED for evaluation of high blood sugars. He has had normal diet as usual but his sugars were running high in 300;s range. He had no nausea or vomiting. No abdominal pain. He did notice frequent urination but no hematuria or dysuria. No fevers or chills. No cough. No chest pain or palpitations. No shortness of breath. No blurry vision. No reports of neuropathy.   In ED, pt was hemodynamically stable. Blood work was notable for sodium of 128, potassium of 5.2, Cr 1.94, glucose 739. He was not in DKA. He was admitted for further management of hyperglycemia and worsening renal function.   Assessment & Plan    Principal Problem:   Uncontrolled type 2 diabetes mellitus with diabetic nephropathy, with long-term current use of insulin (HCC) / Hyperglycemia - A1c in 06/2015 was 8.4 indicating less than goal glycemic control - Start home dose insulin - Start SSI - Not in DKA and hope he corrects quickly to reasonable CBG range 200-300 and if not he will need insulin drip  Active Problems: Hypertension, essential - Lisinopril on hold due to worsening renal function - Added hydralazine for better blood pressure control  Hyponatremia - Due to hyperglycemia - Also has had low sodium around 125 in 06/2015 - Monitor - Hope it will correct with correction of hyperglycemia   Cocaine abuse / Alcohol abuse - Check UDS and ethanol level  Acute renal failure superimposed on stage 3 chronic kidney disease (HCC) - Due to lisinopril - Hold ACEi - Check BMP in am  Hyperkalemia - Likely due to hyperglycemia - will correct with insulin    Dyslipidemia associated with diabetes - Continue omega 3 supplementation    DVT prophylaxis:  - SCD's bilaterally   Radiological Exams on Admission: No results found.   Code Status: Full Family Communication: Plan of care discussed with the patient  Disposition Plan: Admit for further evaluation, medical floor  Manson Passey, MD  [facility] Pager [phone]  Time spent in minutes: 55 minutes  Review of Systems:  Constitutional: Negative for fever, chills and malaise/fatigue. Negative for diaphoresis.  HENT: Negative for hearing loss, ear pain, nosebleeds, congestion, sore throat, neck pain, tinnitus and ear discharge.   Eyes: Negative for blurred vision, double vision, photophobia, pain, discharge and redness.  Respiratory: Negative for cough, hemoptysis, sputum production, shortness of breath, wheezing and stridor.   Cardiovascular: Negative for chest pain, palpitations, orthopnea, claudication and leg swelling.  Gastrointestinal: Negative for nausea, vomiting and abdominal pain. Negative for heartburn, constipation, blood in stool and melena.  Genitourinary: Negative for dysuria, urgency, frequency, hematuria and flank pain.  Musculoskeletal: Negative for myalgias, back pain, joint pain and falls.  Skin: Negative for itching and rash.  Neurological: Negative for dizziness and weakness. Negative for tingling, tremors, sensory change, speech change, focal weakness, loss of consciousness and headaches.  Endo/Heme/Allergies: Negative for environmental allergies and polydipsia. Does not bruise/bleed easily.  Psychiatric/Behavioral: Negative for suicidal ideas. The patient is not nervous/anxious.      Past Medical History  Diagnosis Date  . Diabetes mellitus, type 2 (HCC)   . Hypertension   . Asthma   . Pancreatitis   . Coronary artery disease  s/p PCI RCA '10, s/p PCI LAD '11  . Depressed   . HLD (hyperlipidemia)   . CKD (chronic kidney disease)   . Cocaine  abuse   . Alcohol abuse    Past Surgical History  Procedure Laterality Date  . Cardiac catheterization    . Coronary stent placement    . Left heart catheterization with coronary angiogram Bilateral 08/25/2012    Procedure: LEFT HEART CATHETERIZATION WITH CORONARY ANGIOGRAM;  Surgeon: Kathleene Hazelhristopher D McAlhany, MD;  [facility];  Service: Cardiovascular;  Laterality: Bilateral;   Social History:  reports that he quit smoking about 7 years ago. His smoking use included Cigarettes. He has never used smokeless tobacco. He reports that he drinks alcohol. He reports that he uses illicit drugs (Cocaine).  No Known Allergies  Family History:  Family History  Problem Relation Age of Onset  . Heart disease Mother     MI 9570s     Prior to Admission medications   Medication Sig Start Date End Date Taking? Authorizing Provider  albuterol (PROVENTIL HFA;VENTOLIN HFA) 108 (90 BASE) MCG/ACT inhaler Inhale 2 puffs into the lungs every 6 (six) hours as needed for wheezing or shortness of breath.    Yes Historical Provider, MD  aspirin EC 81 MG tablet Take 81 mg by mouth daily.   Yes Historical Provider, MD  brimonidine (ALPHAGAN) 0.2 % ophthalmic solution Place 1 drop into both eyes 2 (two) times daily.  04/11/15  Yes Historical Provider, MD  budesonide-formoterol (SYMBICORT) 160-4.5 MCG/ACT inhaler Inhale 2 puffs into the lungs 2 (two) times daily.   Yes Historical Provider, MD  Cyanocobalamin (VITAMIN B-12 PO) Take by mouth daily.   Yes Historical Provider, MD  doxazosin (CARDURA) 8 MG tablet Take 4 mg by mouth at bedtime.   Yes Historical Provider, MD  ferrous sulfate 325 (65 FE) MG tablet Take 325 mg by mouth daily with breakfast.   Yes Historical Provider, MD  folic acid (FOLVITE) 1 MG tablet Take 1 tablet (1 mg total) by mouth daily. 07/25/15  Yes Kathlen ModyVijaya Akula, MD  glucose 4 GM chewable tablet Chew 1 tablet by mouth daily as needed for low blood sugar.   Yes Historical Provider, MD  insulin  glargine (LANTUS) 100 UNIT/ML injection Inject 0.1 mLs (10 Units total) into the skin at bedtime. Patient taking differently: Inject 30 Units into the skin daily.  07/25/15  Yes Kathlen ModyVijaya Akula, MD  latanoprost (XALATAN) 0.005 % ophthalmic solution Place 1 drop into both eyes at bedtime.  04/11/15  Yes Historical Provider, MD  Omega-3 Fatty Acids (FISH OIL) 1000 MG CAPS Take 1,000 mg by mouth daily.    Yes Historical Provider, MD  traZODone (DESYREL) 150 MG tablet Take 150 mg by mouth at bedtime.   Yes Historical Provider, MD  insulin aspart (NOVOLOG) 100 UNIT/ML injection Inject 0-9 Units into the skin 3 (three) times daily with meals. Patient not taking: Reported on 10/17/2015 07/25/15   Kathlen ModyVijaya Akula, MD  lisinopril (PRINIVIL,ZESTRIL) 10 MG tablet Take 10 mg by mouth daily.    Historical Provider, MD  thiamine 100 MG tablet Take 1 tablet (100 mg total) by mouth daily. Patient not taking: Reported on 10/17/2015 07/25/15   Kathlen ModyVijaya Akula, MD   Physical Exam: Filed Vitals:   10/17/15 1129 10/17/15 1137 10/17/15 1338  BP: 173/96  152/78  Pulse: 70  51  Temp:  97.7 F (36.5 C)   TempSrc: Oral Oral   Resp: 16  20  SpO2: 100%  99%    Physical Exam  Constitutional: Appears well-developed and well-nourished. No distress.  HENT: Normocephalic. No tonsillar erythema or exudates Eyes: Conjunctivae are normal. No scleral icterus.  Neck: Normal ROM. Neck supple. No JVD. No tracheal deviation. No thyromegaly.  CVS: RRR, S1/S2 +, no murmurs, no gallops, no carotid bruit.  Pulmonary: Effort and breath sounds normal, no stridor, rhonchi, wheezes, rales.  Abdominal: Soft. BS +,  no distension, tenderness, rebound or guarding.  Musculoskeletal: Normal range of motion. No edema and no tenderness.  Lymphadenopathy: No lymphadenopathy noted, cervical, inguinal. Neuro: Alert. Normal reflexes, muscle tone coordination. No focal neurologic deficits. Skin: Skin is warm and dry. No rash noted.  No erythema. No  pallor.  Psychiatric: Normal mood and affect. Behavior, judgment, thought content normal.   Labs on Admission:  Basic Metabolic Panel:  Recent Labs Lab 10/17/15 1200  NA 128*  K 5.2*  CL 95*  CO2 23  GLUCOSE 739*  BUN 40*  CREATININE 1.94*  CALCIUM 9.4   Liver Function Tests: No results for input(s): AST, ALT, ALKPHOS, BILITOT, PROT, ALBUMIN in the last 168 hours. No results for input(s): LIPASE, AMYLASE in the last 168 hours. No results for input(s): AMMONIA in the last 168 hours. CBC:  Recent Labs Lab 10/17/15 1200  WBC 4.4  HGB 14.5  HCT 41.1  MCV 79.7  PLT 157   Cardiac Enzymes: No results for input(s): CKTOTAL, CKMB, CKMBINDEX, TROPONINI in the last 168 hours. BNP: Invalid input(s): POCBNP CBG:  Recent Labs Lab 10/17/15 1134  GLUCAP >600*    If 7PM-7AM, please contact night-coverage [email or web address] Password [email or web address] 10/17/2015, 1:47 PM

## 2015-10-17 NOTE — Progress Notes (Addendum)
1446 ED [place] [facility]) [phone] Spoke with Jari Favrescar, RN who provided correct spelling of MD name as Anyim, CM updated pcp in EPIC Cm updated Jari FavreOscar of pt admission for elevated blood sugar Jari FavreOscar [facility] Hulen SkainsAnyim requested he go to ED   1431 Pt reports he is [facility] Del[facility] and [facility]  Pt [facility]  EPIC updated States  He spoke with [facility] this am

## 2015-10-17 NOTE — ED Notes (Addendum)
Per lab, Glucose is 739.  Informed MD

## 2015-10-17 NOTE — ED Notes (Signed)
Per patient, states sugars have been high-states was [place] [facility] and they took CBG and said it was high and to come to ED-increased urination and thirst

## 2015-10-17 NOTE — ED Provider Notes (Signed)
CSN: [id number]     Arrival date & time 10/17/15  1119 History   First MD Initiated Contact with Patient 10/17/15 1127     Chief Complaint  Patient presents with  . Hyperglycemia     (Consider location/radiation/quality/duration/timing/severity/associated sxs/prior Treatment) HPI Comments: Patient here complaining of increased blood glucose. Just completed a 30 day substance abuse treatment program where he says blood sugars running 300s. Went for outpatient follow-up today was told to come here for further workup. Today's blood sugar this morning and he says it was 135. Before he went to see his doctor today he had oranges and old male and he states that a blood sugar was not checked. Endorses polyuria and polydipsia. Denies any weakness. States that his regular dose of insulin was changed when he was in treatment  Patient is a 53 y.o. male presenting with hyperglycemia. The history is provided by the patient.  Hyperglycemia   Past Medical History  Diagnosis Date  . Diabetes mellitus, type 2 (HCC)   . Hypertension   . Asthma   . Pancreatitis   . Coronary artery disease     s/p PCI RCA '10, s/p PCI LAD '11  . Depressed   . HLD (hyperlipidemia)   . CKD (chronic kidney disease)   . Cocaine abuse   . Alcohol abuse    Past Surgical History  Procedure Laterality Date  . Cardiac catheterization    . Coronary stent placement    . Left heart catheterization with coronary angiogram Bilateral 08/25/2012    Procedure: LEFT HEART CATHETERIZATION WITH CORONARY ANGIOGRAM;  Surgeon: Kathleene Hazel, MD;  Location: [facility] CATH LAB;  Service: Cardiovascular;  Laterality: Bilateral;   Family History  Problem Relation Age of Onset  . Heart disease Mother     MI 86s   Social History  Substance Use Topics  . Smoking status: Former Smoker    Types: Cigarettes    Quit date: 11/25/2007  . Smokeless tobacco: Never Used  . Alcohol Use: Yes     Comment: use to drink "heavy", quit 2009.  06/24/15 12 pack of beer daily.    Review of Systems  All other systems reviewed and are negative.     Allergies  Review of patient's allergies indicates no known allergies.  Home Medications   Prior to Admission medications   Medication Sig Start Date End Date Taking? Authorizing Provider  albuterol (PROVENTIL HFA;VENTOLIN HFA) 108 (90 BASE) MCG/ACT inhaler Inhale 2 puffs into the lungs every 6 (six) hours as needed for wheezing or shortness of breath.     Historical Provider, MD  aspirin EC 81 MG tablet Take 81 mg by mouth daily.    Historical Provider, MD  brimonidine (ALPHAGAN) 0.2 % ophthalmic solution Place 1 drop into both eyes daily.  04/11/15   Historical Provider, MD  budesonide-formoterol (SYMBICORT) 160-4.5 MCG/ACT inhaler Inhale 2 puffs into the lungs 2 (two) times daily.    Historical Provider, MD  Cyanocobalamin (VITAMIN B-12 PO) Take by mouth daily.    Historical Provider, MD  folic acid (FOLVITE) 1 MG tablet Take 1 tablet (1 mg total) by mouth daily. 07/25/15   Kathlen Mody, MD  insulin aspart (NOVOLOG) 100 UNIT/ML injection Inject 0-9 Units into the skin 3 (three) times daily with meals. 07/25/15   Kathlen Mody, MD  insulin glargine (LANTUS) 100 UNIT/ML injection Inject 0.1 mLs (10 Units total) into the skin at bedtime. 07/25/15   Kathlen Mody, MD  IRON PO Take 1 tablet by  mouth daily.    Historical Provider, MD  latanoprost (XALATAN) 0.005 % ophthalmic solution Place 1 drop into both eyes at bedtime.  04/11/15   Historical Provider, MD  Multiple Vitamin (MULTIVITAMIN WITH MINERALS) TABS tablet Take 1 tablet by mouth daily.    Historical Provider, MD  Omega-3 Fatty Acids (FISH OIL) 1000 MG CAPS Take 1,000 mg by mouth daily.     Historical Provider, MD  thiamine 100 MG tablet Take 1 tablet (100 mg total) by mouth daily. 07/25/15   Kathlen ModyVijaya Akula, MD  traZODone (DESYREL) 100 MG tablet Take 100 mg by mouth at bedtime.    Historical Provider, MD   BP 173/96 mmHg  Pulse 70   Temp(Src) 97.7 F (36.5 C) (Oral)  Resp 16  SpO2 100% Physical Exam  Constitutional: He is oriented to person, place, and time. He appears well-developed and well-nourished.  Non-toxic appearance. No distress.  HENT:  Head: Normocephalic and atraumatic.  Eyes: Conjunctivae, EOM and lids are normal. Pupils are equal, round, and reactive to light.  Neck: Normal range of motion. Neck supple. No tracheal deviation present. No thyroid mass present.  Cardiovascular: Normal rate, regular rhythm and normal heart sounds.  Exam reveals no gallop.   No murmur heard. Pulmonary/Chest: Effort normal and breath sounds normal. No stridor. No respiratory distress. He has no decreased breath sounds. He has no wheezes. He has no rhonchi. He has no rales.  Abdominal: Soft. Normal appearance and bowel sounds are normal. He exhibits no distension. There is no tenderness. There is no rebound and no CVA tenderness.  Musculoskeletal: Normal range of motion. He exhibits no edema or tenderness.  Neurological: He is alert and oriented to person, place, and time. He has normal strength. No cranial nerve deficit or sensory deficit. GCS eye subscore is 4. GCS verbal subscore is 5. GCS motor subscore is 6.  Skin: Skin is warm and dry. No abrasion and no rash noted.  Psychiatric: He has a normal mood and affect. His speech is normal and behavior is normal.  Nursing note and vitals reviewed.   ED Course  Procedures (including critical care time) Labs Review Labs Reviewed  CBG MONITORING, ED - Abnormal; Notable for the following:    Glucose-Capillary >600 (*)    All other components within normal limits  BASIC METABOLIC PANEL  CBC  URINALYSIS, ROUTINE W REFLEX MICROSCOPIC (NOT AT [facility])  BLOOD GAS, VENOUS    Imaging Review No results found. I have personally reviewed and evaluated these images and lab results as part of my medical decision-making.   EKG Interpretation None      MDM   Final diagnoses:  None     Patient given IV fluids as well as IV insulin and will be admitted to the medicine service  Lorre NickAnthony Mehreen Azizi, MD 10/17/15 1324

## 2015-10-17 NOTE — ED Notes (Signed)
Glucose 575 per Carollee HerterShannon in lab

## 2015-10-17 NOTE — ED Notes (Signed)
Nurse drawing labs. 

## 2015-10-18 LAB — CBC WITH DIFFERENTIAL/PLATELET
BASOS ABS: 0 10*3/uL (ref 0.0–0.1)
Basophils Relative: 0 %
Eosinophils Absolute: 0.3 10*3/uL (ref 0.0–0.7)
Eosinophils Relative: 5 %
HEMATOCRIT: 37.1 % — AB (ref 39.0–52.0)
Hemoglobin: 13.2 g/dL (ref 13.0–17.0)
LYMPHS ABS: 2.3 10*3/uL (ref 0.7–4.0)
LYMPHS PCT: 44 %
MCH: 27.4 pg (ref 26.0–34.0)
MCHC: 35.6 g/dL (ref 30.0–36.0)
MCV: 77.1 fL — AB (ref 78.0–100.0)
MONO ABS: 0.4 10*3/uL (ref 0.1–1.0)
Monocytes Relative: 8 %
NEUTROS ABS: 2.3 10*3/uL (ref 1.7–7.7)
Neutrophils Relative %: 43 %
Platelets: 148 10*3/uL — ABNORMAL LOW (ref 150–400)
RBC: 4.81 MIL/uL (ref 4.22–5.81)
RDW: 13.6 % (ref 11.5–15.5)
WBC: 5.3 10*3/uL (ref 4.0–10.5)

## 2015-10-18 LAB — GLUCOSE, CAPILLARY: Glucose-Capillary: 203 mg/dL — ABNORMAL HIGH (ref 65–99)

## 2015-10-18 NOTE — Progress Notes (Signed)
Pt discharged from the unit via wheelchair. AVS was reviewed with the pt and there were no questions or concerns from the pt. Nakeia Calvi W Daisia Slomski, RN

## 2015-10-18 NOTE — Discharge Instructions (Signed)
Hyperglycemia °Hyperglycemia occurs when the glucose (sugar) in your blood is too high. Hyperglycemia can happen for many reasons, but it most often happens to people who do not know they have diabetes or are not managing their diabetes properly.  °CAUSES  °Whether you have diabetes or not, there are other causes of hyperglycemia. Hyperglycemia can occur when you have diabetes, but it can also occur in other situations that you might not be as aware of, such as: °Diabetes °· If you have diabetes and are having problems controlling your blood glucose, hyperglycemia could occur because of some of the following reasons: °¨ Not following your meal plan. °¨ Not taking your diabetes medications or not taking it properly. °¨ Exercising less or doing less activity than you normally do. °¨ Being sick. °Pre-diabetes °· This cannot be ignored. Before people develop Type 2 diabetes, they almost always have "pre-diabetes." This is when your blood glucose levels are higher than normal, but not yet high enough to be diagnosed as diabetes. Research has shown that some long-term damage to the body, especially the heart and circulatory system, may already be occurring during pre-diabetes. If you take action to manage your blood glucose when you have pre-diabetes, you may delay or prevent Type 2 diabetes from developing. °Stress °· If you have diabetes, you may be "diet" controlled or on oral medications or insulin to control your diabetes. However, you may find that your blood glucose is higher than usual in the hospital whether you have diabetes or not. This is often referred to as "stress hyperglycemia." Stress can elevate your blood glucose. This happens because of hormones put out by the body during times of stress. If stress has been the cause of your high blood glucose, it can be followed regularly by your caregiver. That way he/she can make sure your hyperglycemia does not continue to get worse or progress to  diabetes. °Steroids °· Steroids are medications that act on the infection fighting system (immune system) to block inflammation or infection. One side effect can be a rise in blood glucose. Most people can produce enough extra insulin to allow for this rise, but for those who cannot, steroids make blood glucose levels go even higher. It is not unusual for steroid treatments to "uncover" diabetes that is developing. It is not always possible to determine if the hyperglycemia will go away after the steroids are stopped. A special blood test called an A1c is sometimes done to determine if your blood glucose was elevated before the steroids were started. °SYMPTOMS °· Thirsty. °· Frequent urination. °· Dry mouth. °· Blurred vision. °· Tired or fatigue. °· Weakness. °· Sleepy. °· Tingling in feet or leg. °DIAGNOSIS  °Diagnosis is made by monitoring blood glucose in one or all of the following ways: °· A1c test. This is a chemical found in your blood. °· Fingerstick blood glucose monitoring. °· Laboratory results. °TREATMENT  °First, knowing the cause of the hyperglycemia is important before the hyperglycemia can be treated. Treatment may include, but is not be limited to: °· Education. °· Change or adjustment in medications. °· Change or adjustment in meal plan. °· Treatment for an illness, infection, etc. °· More frequent blood glucose monitoring. °· Change in exercise plan. °· Decreasing or stopping steroids. °· Lifestyle changes. °HOME CARE INSTRUCTIONS  °· Test your blood glucose as directed. °· Exercise regularly. Your caregiver will give you instructions about exercise. Pre-diabetes or diabetes which comes on with stress is helped by exercising. °· Eat wholesome,   balanced meals. Eat often and at regular, fixed times. Your caregiver or nutritionist will give you a meal plan to guide your sugar intake. °· Being at an ideal weight is important. If needed, losing as little as 10 to 15 pounds may help improve blood  glucose levels. °SEEK MEDICAL CARE IF:  °· You have questions about medicine, activity, or diet. °· You continue to have symptoms (problems such as increased thirst, urination, or weight gain). °SEEK IMMEDIATE MEDICAL CARE IF:  °· You are vomiting or have diarrhea. °· Your breath smells fruity. °· You are breathing faster or slower. °· You are very sleepy or incoherent. °· You have numbness, tingling, or pain in your feet or hands. °· You have chest pain. °· Your symptoms get worse even though you have been following your caregiver's orders. °· If you have any other questions or concerns. °  °This information is not intended to replace advice given to you by your health care provider. Make sure you discuss any questions you have with your health care provider. °  °Document Released: 05/06/2001 Document Revised: 02/02/2012 Document Reviewed: 07/17/2015 °Elsevier Interactive Patient Education ©2016 [employer] ° °

## 2015-10-18 NOTE — Discharge Summary (Signed)
Physician Discharge Summary  Jose [name] WUJ:[id number] DOB: [birth year] DOA: 10/17/2015  PCP: Margot Ables, MD  Admit date: 10/17/2015 Discharge date: 10/18/2015  Recommendations for Outpatient Follow-up:  Continue to hold lisinopril as it may have contributed to worsening kidney function. Please check with your primary care physician when would be safe to resume this medication or should you be switched to another one.  Discharge Diagnoses:  Principal Problem:   Uncontrolled type 2 diabetes mellitus with diabetic nephropathy, with long-term current use of insulin (HCC) Active Problems:   Hypertension   Hyponatremia   Cocaine abuse   Alcohol abuse   Hyperglycemia   Acute renal failure superimposed on stage 3 chronic kidney disease (HCC)   Hyperkalemia   Dyslipidemia associated with type 2 diabetes mellitus (HCC)    Discharge Condition: stable   Diet recommendation: as tolerated   History of present illness:  54 year old male with past medical history of substance abuse, diabetes, insulin dependent, hypertension, dyslipidemia who presented to [facility] ED for evaluation of high blood sugars. He has had normal diet as usual but his sugars were running high in 300's range. No nausea or vomiting. No abdominal pain.   In ED, pt was hemodynamically stable. Blood work was notable for sodium of 128, potassium of 5.2, Cr 1.94, glucose 739. He was not in DKA. He was admitted for further management of hyperglycemia and worsening renal function.   Hospital Course:   Assessment & Plan    Principal Problem:  Uncontrolled type 2 diabetes mellitus with diabetic nephropathy, with long-term current use of insulin (HCC) / Hyperglycemia - A1c in 06/2015 was 8.4 indicating less than goal glycemic control - Patient will continue taking insulin regimen per prior to this admission - His CBGs are so far controlled since the admission - Please note patient was not in DKA on this admission and  did not require insulin drip during this hospital stay.  Active Problems: Hypertension, essential - Lisinopril on hold due to worsening renal function. - BP 105/61.  Hyponatremia - Due to hyperglycemia - Also has had low sodium around 125 in 06/2015 - Sodium improved to 132 with correction of hyperglycemia  Cocaine abuse / Alcohol abuse - UDS and ethanol level WNL  Acute renal failure superimposed on stage 3 chronic kidney disease (HCC) - Due to lisinopril - Cr 1.9 --> 1.8, improving with holding lisinopril   Hyperkalemia - Likely due to hyperglycemia - Corrected with insulin   Dyslipidemia associated with diabetes - Continue omega 3 supplementation on discharge    DVT prophylaxis:  - SCD's bilaterally in hospital    Code Status: Full Family Communication: Plan of care discussed with the patient     Signed:  Manson Passey, MD  [facility] 10/18/2015, 9:09 AM  Pager #: [phone]  Time spent in minutes: less than 30 minutes  Procedures:  None   Consultations:  None   Discharge Exam: Filed Vitals:   10/17/15 2158 10/18/15 0510  BP: 122/71 105/61  Pulse: 63 56  Temp: 98.2 F (36.8 C) 98 F (36.7 C)  Resp: 20 18   Filed Vitals:   10/17/15 2010 10/17/15 2158 10/18/15 0510 10/18/15 0528  BP:  122/71 105/61   Pulse:  63 56   Temp:  98.2 F (36.8 C) 98 F (36.7 C)   TempSrc:  Oral Oral   Resp:  20 18   Weight:    81.5 kg (179 lb 10.8 oz)  SpO2: 99% 100% 97%  General: Pt is alert, follows commands appropriately, not in acute distress Cardiovascular: Regular rate and rhythm, S1/S2 +, no murmurs Respiratory: Clear to auscultation bilaterally, no wheezing, no crackles, no rhonchi Abdominal: Soft, non tender, non distended, bowel sounds +, no guarding Extremities: no edema, no cyanosis, pulses palpable bilaterally DP and PT Neuro: Grossly nonfocal  Discharge Instructions  Discharge Instructions    Call MD for:  difficulty  breathing, headache or visual disturbances    Complete by:  As directed      Call MD for:  persistant nausea and vomiting    Complete by:  As directed      Call MD for:  severe uncontrolled pain    Complete by:  As directed      Diet - low sodium heart healthy    Complete by:  As directed      Discharge instructions    Complete by:  As directed   Continue to hold lisinopril as it may have contributed to worsening kidney function. Please check with your primary care physician when would be safe to resume this medication or should you be switched to another one.     Increase activity slowly    Complete by:  As directed             Medication List    STOP taking these medications        lisinopril 10 MG tablet  Commonly known as:  PRINIVIL,ZESTRIL      TAKE these medications        albuterol 108 (90 BASE) MCG/ACT inhaler  Commonly known as:  PROVENTIL HFA;VENTOLIN HFA  Inhale 2 puffs into the lungs every 6 (six) hours as needed for wheezing or shortness of breath.     aspirin EC 81 MG tablet  Take 81 mg by mouth daily.     brimonidine 0.2 % ophthalmic solution  Commonly known as:  ALPHAGAN  Place 1 drop into both eyes 2 (two) times daily.     budesonide-formoterol 160-4.5 MCG/ACT inhaler  Commonly known as:  SYMBICORT  Inhale 2 puffs into the lungs 2 (two) times daily.     doxazosin 8 MG tablet  Commonly known as:  CARDURA  Take 4 mg by mouth at bedtime.     ferrous sulfate 325 (65 FE) MG tablet  Take 325 mg by mouth daily with breakfast.     Fish Oil 1000 MG Caps  Take 1,000 mg by mouth daily.     folic acid 1 MG tablet  Commonly known as:  FOLVITE  Take 1 tablet (1 mg total) by mouth daily.     glucose 4 GM chewable tablet  Chew 1 tablet by mouth daily as needed for low blood sugar.     insulin aspart 100 UNIT/ML injection  Commonly known as:  novoLOG  Inject 0-9 Units into the skin 3 (three) times daily with meals.     insulin glargine 100 UNIT/ML  injection  Commonly known as:  LANTUS  Inject 0.1 mLs (10 Units total) into the skin at bedtime.     latanoprost 0.005 % ophthalmic solution  Commonly known as:  XALATAN  Place 1 drop into both eyes at bedtime.     thiamine 100 MG tablet  Take 1 tablet (100 mg total) by mouth daily.     traZODone 150 MG tablet  Commonly known as:  DESYREL  Take 150 mg by mouth at bedtime.     VITAMIN B-12 PO  Take by  mouth daily.           Follow-up Information    Follow up with Margot Ables, MD. Schedule an appointment as soon as possible for a visit in 1 week.   Specialty:  Family Medicine   Why:  Follow up appt after recent hospitalization   Contact information:   [address] [place] [address] [phone]        The results of significant diagnostics from this hospitalization (including imaging, microbiology, ancillary and laboratory) are listed below for reference.    Significant Diagnostic Studies: No results found.  Microbiology: No results found for this or any previous visit (from the past 240 hour(s)).   Labs: Basic Metabolic Panel:  Recent Labs Lab 10/17/15 1200 10/17/15 1433 10/17/15 1925  NA 128* 132*  --   K 5.2* 4.9  --   CL 95* 102  --   CO2 23 22  --   GLUCOSE 739* 575*  --   BUN 40* 41*  --   CREATININE 1.94* 1.81*  --   CALCIUM 9.4 9.4  --   MG  --   --  2.0  PHOS  --   --  4.7*   Liver Function Tests: No results for input(s): AST, ALT, ALKPHOS, BILITOT, PROT, ALBUMIN in the last 168 hours. No results for input(s): LIPASE, AMYLASE in the last 168 hours. No results for input(s): AMMONIA in the last 168 hours. CBC:  Recent Labs Lab 10/17/15 1200 10/18/15 0515  WBC 4.4 5.3  NEUTROABS  --  2.3  HGB 14.5 13.2  HCT 41.1 37.1*  MCV 79.7 77.1*  PLT 157 148*   Cardiac Enzymes: No results for input(s): CKTOTAL, CKMB, CKMBINDEX, TROPONINI in the last 168 hours. BNP: BNP (last 3  results) No results for input(s): BNP in the last 8760 hours.  ProBNP (last 3 results) No results for input(s): PROBNP in the last 8760 hours.  CBG:  Recent Labs Lab 10/17/15 1134 10/17/15 1619 10/17/15 1742 10/17/15 2156 10/18/15 0803  GLUCAP >600* 275* 224* 96 203*

## 2015-10-19 LAB — HEMOGLOBIN A1C
HEMOGLOBIN A1C: 11.5 % — AB (ref 4.8–5.6)
MEAN PLASMA GLUCOSE: 283 mg/dL

## 2016-02-10 ENCOUNTER — Encounter (HOSPITAL_BASED_OUTPATIENT_CLINIC_OR_DEPARTMENT_OTHER): Payer: Self-pay

## 2016-02-10 ENCOUNTER — Emergency Department (HOSPITAL_BASED_OUTPATIENT_CLINIC_OR_DEPARTMENT_OTHER): Payer: Non-veteran care

## 2016-02-10 ENCOUNTER — Emergency Department (HOSPITAL_BASED_OUTPATIENT_CLINIC_OR_DEPARTMENT_OTHER)
Admission: EM | Admit: 2016-02-10 | Discharge: 2016-02-10 | Disposition: A | Discharge status: Home / Self Care | Payer: Non-veteran care | Attending: Emergency Medicine | Admitting: Emergency Medicine

## 2016-02-10 DIAGNOSIS — E119 Type 2 diabetes mellitus without complications: Secondary | ICD-10-CM | POA: Insufficient documentation

## 2016-02-10 DIAGNOSIS — M25559 Pain in unspecified hip: Secondary | ICD-10-CM

## 2016-02-10 DIAGNOSIS — Z79899 Other long term (current) drug therapy: Secondary | ICD-10-CM | POA: Diagnosis not present

## 2016-02-10 DIAGNOSIS — Z9889 Other specified postprocedural states: Secondary | ICD-10-CM | POA: Diagnosis not present

## 2016-02-10 DIAGNOSIS — Z955 Presence of coronary angioplasty implant and graft: Secondary | ICD-10-CM | POA: Diagnosis not present

## 2016-02-10 DIAGNOSIS — I10 Essential (primary) hypertension: Secondary | ICD-10-CM | POA: Diagnosis not present

## 2016-02-10 DIAGNOSIS — M25552 Pain in left hip: Secondary | ICD-10-CM | POA: Diagnosis not present

## 2016-02-10 DIAGNOSIS — Z7951 Long term (current) use of inhaled steroids: Secondary | ICD-10-CM | POA: Insufficient documentation

## 2016-02-10 DIAGNOSIS — Z87891 Personal history of nicotine dependence: Secondary | ICD-10-CM | POA: Diagnosis not present

## 2016-02-10 DIAGNOSIS — R200 Anesthesia of skin: Secondary | ICD-10-CM | POA: Diagnosis not present

## 2016-02-10 DIAGNOSIS — F329 Major depressive disorder, single episode, unspecified: Secondary | ICD-10-CM | POA: Insufficient documentation

## 2016-02-10 DIAGNOSIS — Z7982 Long term (current) use of aspirin: Secondary | ICD-10-CM | POA: Diagnosis not present

## 2016-02-10 DIAGNOSIS — M533 Sacrococcygeal disorders, not elsewhere classified: Secondary | ICD-10-CM | POA: Insufficient documentation

## 2016-02-10 DIAGNOSIS — Z794 Long term (current) use of insulin: Secondary | ICD-10-CM | POA: Insufficient documentation

## 2016-02-10 HISTORY — DX: Type 2 diabetes mellitus without complications: E11.9

## 2016-02-10 HISTORY — DX: Depression, unspecified: F32.A

## 2016-02-10 HISTORY — DX: Major depressive disorder, single episode, unspecified: F32.9

## 2016-02-10 MED ORDER — KETOROLAC TROMETHAMINE 60 MG/2ML IM SOLN
60.0000 mg | Freq: Once | INTRAMUSCULAR | Status: AC
  Administered 2016-02-10: 60 mg via INTRAMUSCULAR
  Filled 2016-02-10: qty 2

## 2016-02-10 MED ORDER — NAPROXEN 500 MG PO TABS: 500.0000 mg | ORAL_TABLET | Freq: Two times a day (BID) | ORAL | Status: DC

## 2016-02-10 MED ORDER — PREDNISONE 50 MG PO TABS
60.0000 mg | ORAL_TABLET | Freq: Once | ORAL | Status: AC
  Administered 2016-02-10: 60 mg via ORAL
  Filled 2016-02-10: qty 1

## 2016-02-10 MED ORDER — PREDNISONE 20 MG PO TABS: 40.0000 mg | ORAL_TABLET | Freq: Every day | ORAL | Status: DC

## 2016-02-10 NOTE — ED Notes (Signed)
PA at bedside.

## 2016-02-10 NOTE — ED Notes (Signed)
Patient transported to X-ray 

## 2016-02-10 NOTE — Discharge Instructions (Signed)
You have been seen today for hip pain. Your imaging showed evidence of arthritic changes. Follow-up with orthopedics for reevaluation and chronic management of this issue. Follow up with PCP as needed. Return to ED should symptoms worsen.

## 2016-02-10 NOTE — ED Notes (Signed)
Patient here with 3 months of left hip discomfort and numbness radiating down leg, reports severe pain with standing.

## 2016-02-10 NOTE — ED Provider Notes (Signed)
CSN: [id number]     Arrival date & time 02/10/16  1478 History   First MD Initiated Contact with Patient 02/10/16 1022     Chief Complaint  Patient presents with  . hip pain      (Consider location/radiation/quality/duration/timing/severity/associated sxs/prior Treatment) HPI   Jose [name] is a 55 y.o. male, with a history of Hypertension and DM, presenting to the ED with left hip pain for the last three months. Pain is constant, 9/10, burning, radiating down the front and back of the left leg. Pt also complains of numbness down the leg. Pain increases with ambulation. Patient has tried ibuprofen with little relief. Pt denies falls or trauma. Patient denies neurologic deficits, fever/chills, nausea/vomiting, or any other complaints.    Past Medical History  Diagnosis Date  . Hypertension   . Diabetes mellitus without complication (HCC)   . Depression    Past Surgical History  Procedure Laterality Date  . Cardiac catheterization    . Coronary stent placement    . Left heart catheterization with coronary angiogram Bilateral 08/25/2012    Procedure: LEFT HEART CATHETERIZATION WITH CORONARY ANGIOGRAM;  Surgeon: Kathleene Hazel, MD;  Location: [facility] CATH LAB;  Service: Cardiovascular;  Laterality: Bilateral;   Family History  Problem Relation Age of Onset  . Heart disease Mother     MI 40s   Social History  Substance Use Topics  . Smoking status: Former Smoker    Types: Cigarettes    Quit date: 11/25/2007  . Smokeless tobacco: Never Used  . Alcohol Use: Yes     Comment: use to drink "heavy", quit 2009. 06/24/15 12 pack of beer daily.    Review of Systems  Constitutional: Negative for fever, chills and diaphoresis.  Gastrointestinal: Negative for nausea and vomiting.  Genitourinary: Negative for dysuria and penile pain.  Musculoskeletal: Positive for back pain and arthralgias (left hip).      Allergies  Review of patient's allergies indicates no known  allergies.  Home Medications   Prior to Admission medications   Medication Sig Start Date End Date Taking? Authorizing Provider  albuterol (PROVENTIL HFA;VENTOLIN HFA) 108 (90 BASE) MCG/ACT inhaler Inhale 2 puffs into the lungs every 6 (six) hours as needed for wheezing or shortness of breath.     Historical Provider, MD  aspirin EC 81 MG tablet Take 81 mg by mouth daily.    Historical Provider, MD  brimonidine (ALPHAGAN) 0.2 % ophthalmic solution Place 1 drop into both eyes 2 (two) times daily.  04/11/15   Historical Provider, MD  budesonide-formoterol (SYMBICORT) 160-4.5 MCG/ACT inhaler Inhale 2 puffs into the lungs 2 (two) times daily.    Historical Provider, MD  Cyanocobalamin (VITAMIN B-12 PO) Take by mouth daily.    Historical Provider, MD  doxazosin (CARDURA) 8 MG tablet Take 4 mg by mouth at bedtime.    Historical Provider, MD  ferrous sulfate 325 (65 FE) MG tablet Take 325 mg by mouth daily with breakfast.    Historical Provider, MD  folic acid (FOLVITE) 1 MG tablet Take 1 tablet (1 mg total) by mouth daily. 07/25/15   Kathlen Mody, MD  glucose 4 GM chewable tablet Chew 1 tablet by mouth daily as needed for low blood sugar.    Historical Provider, MD  insulin aspart (NOVOLOG) 100 UNIT/ML injection Inject 0-9 Units into the skin 3 (three) times daily with meals. Patient not taking: Reported on 10/17/2015 07/25/15   Kathlen Mody, MD  insulin glargine (LANTUS) 100 UNIT/ML injection Inject 0.1  mLs (10 Units total) into the skin at bedtime. Patient taking differently: Inject 30 Units into the skin daily.  07/25/15   Kathlen Mody, MD  latanoprost (XALATAN) 0.005 % ophthalmic solution Place 1 drop into both eyes at bedtime.  04/11/15   Historical Provider, MD  naproxen (NAPROSYN) 500 MG tablet Take 1 tablet (500 mg total) by mouth 2 (two) times daily. 02/10/16   Reygan Heagle C Jlen Wintle, PA-C  Omega-3 Fatty Acids (FISH OIL) 1000 MG CAPS Take 1,000 mg by mouth daily.     Historical Provider, MD  predniSONE  (DELTASONE) 20 MG tablet Take 2 tablets (40 mg total) by mouth daily. 02/10/16   Kathya Wilz C Danielys Madry, PA-C  thiamine 100 MG tablet Take 1 tablet (100 mg total) by mouth daily. Patient not taking: Reported on 10/17/2015 07/25/15   Kathlen Mody, MD  traZODone (DESYREL) 150 MG tablet Take 150 mg by mouth at bedtime.    Historical Provider, MD   BP 133/73 mmHg  Pulse 78  Temp(Src) 97.6 F (36.4 C) (Oral)  Resp 18  Ht  (1.803 m)  Wt 84.369 kg  BMI 25.95 kg/m2  SpO2 99% Physical Exam  Constitutional: He is oriented to person, place, and time. He appears well-developed and well-nourished. No distress.  HENT:  Head: Normocephalic and atraumatic.  Eyes: Conjunctivae are normal.  Cardiovascular: Normal rate, regular rhythm and intact distal pulses.   Pulmonary/Chest: Effort normal.  Musculoskeletal:  Tenderness to the left sacral musculature. Full ROM in all extremities and spine. Pain with internal rotation of left hip. No paraspinal tenderness.   Neurological: He is alert and oriented to person, place, and time. He has normal reflexes.  No sensory deficits. Strength 5/5 in all extremities. Coordination intact.   Skin: Skin is warm and dry. He is not diaphoretic.  Nursing note and vitals reviewed.   ED Course  Procedures (including critical care time) Labs Review Labs Reviewed - No data to display  Imaging Review Dg Lumbar Spine Complete  02/10/2016  CLINICAL DATA:  Lumbago with left lower extremity radicular symptoms EXAM: LUMBAR SPINE - COMPLETE 4+ VIEW COMPARISON:  None. FINDINGS: Frontal, lateral, spot lumbosacral lateral, and bilateral oblique views were obtained. There is a rudimentary rib on the left at L1. Other vertebral bodies are non-rib-bearing. The lowest lumbar vertebral body is denoted L5. There is no fracture or spondylolisthesis. There is marked disc space narrowing at L5-S1. There is moderate disc space narrowing at L3-4 and L4-5. There are prominent anterior and lateral  osteophytes in the mid lower lumbar regions. There is facet osteoarthritic change at L4-5 on the left and at L5-S1 bilaterally. 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 No fracture or spondylolisthesis. Electronically Signed   By: Bretta Bang III M.D.   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 EXAM: DG HIP (WITH OR WITHOUT PELVIS) 2-3V LEFT COMPARISON:  None. FINDINGS: There is mild degenerate change involving both hips. No evidence for acute fracture or subluxation. Degenerative change also identified at L5-S1. There is atherosclerotic calcification femoral arteries bilaterally. IMPRESSION: No evidence for acute  abnormality. Electronically Signed   By: Norva Pavlov M.D.   On: 02/10/2016 11:06   I have personally  reviewed and evaluated these images as part of my medical decision-making.   EKG Interpretation None      MDM   Final diagnoses:  Hip pain    Jose [name] presents with left hip pain for the last 3 months.  Patient's presentation is consistent with radicular pain, possibly from lumbar or sacral region or in the hip itself. Pain could also be caused by arthritic changes. No red flag symptoms and no evidence of cauda equina or septic joint. X-ray shows no acute abnormalities. Patient to follow-up with orthopedics outpatient. Home care and return precautions discussed. Patient voiced understanding of these instructions, accepts the plan, and is comfortable with discharge.  Filed Vitals:   02/10/16 0955 02/10/16 1134  BP: 141/80 133/73  Pulse: 68 78  Temp: 97.6 F (36.4 C)   TempSrc: Oral   Resp: 18 18  Height: 5\' 11"  (1.803 m)   Weight: 84.369 kg   SpO2: 98% 99%      Anselm PancoastShawn C Ange Puskas,  PA-C 02/10/16 1147  Pricilla LovelessScott Goldston, MD 02/11/16 [phone]

## 2016-04-10 DIAGNOSIS — E785 Hyperlipidemia, unspecified: Secondary | ICD-10-CM | POA: Insufficient documentation

## 2016-04-10 DIAGNOSIS — I2510 Atherosclerotic heart disease of native coronary artery without angina pectoris: Secondary | ICD-10-CM | POA: Insufficient documentation

## 2016-04-10 DIAGNOSIS — N400 Enlarged prostate without lower urinary tract symptoms: Secondary | ICD-10-CM | POA: Insufficient documentation

## 2016-04-10 DIAGNOSIS — E1169 Type 2 diabetes mellitus with other specified complication: Secondary | ICD-10-CM | POA: Insufficient documentation

## 2016-04-10 DIAGNOSIS — E119 Type 2 diabetes mellitus without complications: Secondary | ICD-10-CM | POA: Insufficient documentation

## 2016-05-10 ENCOUNTER — Emergency Department (HOSPITAL_BASED_OUTPATIENT_CLINIC_OR_DEPARTMENT_OTHER)
Admission: EM | Admit: 2016-05-10 | Discharge: 2016-05-10 | Disposition: A | Discharge status: Home / Self Care | Payer: Non-veteran care | Attending: Dermatology | Admitting: Dermatology

## 2016-05-10 ENCOUNTER — Encounter (HOSPITAL_BASED_OUTPATIENT_CLINIC_OR_DEPARTMENT_OTHER): Payer: Self-pay

## 2016-05-10 DIAGNOSIS — I10 Essential (primary) hypertension: Secondary | ICD-10-CM | POA: Diagnosis not present

## 2016-05-10 DIAGNOSIS — E119 Type 2 diabetes mellitus without complications: Secondary | ICD-10-CM | POA: Diagnosis not present

## 2016-05-10 DIAGNOSIS — Z0130 Encounter for examination of blood pressure without abnormal findings: Secondary | ICD-10-CM | POA: Diagnosis not present

## 2016-05-10 DIAGNOSIS — Z794 Long term (current) use of insulin: Secondary | ICD-10-CM | POA: Diagnosis not present

## 2016-05-10 DIAGNOSIS — Z87891 Personal history of nicotine dependence: Secondary | ICD-10-CM | POA: Diagnosis not present

## 2016-05-10 DIAGNOSIS — Z5321 Procedure and treatment not carried out due to patient leaving prior to being seen by health care provider: Secondary | ICD-10-CM | POA: Insufficient documentation

## 2016-05-10 DIAGNOSIS — F329 Major depressive disorder, single episode, unspecified: Secondary | ICD-10-CM | POA: Diagnosis not present

## 2016-05-10 NOTE — ED Notes (Signed)
Pt told registration that he was leaving.  ?

## 2016-05-10 NOTE — ED Notes (Signed)
Pt has no complaints at this time. Asking to have print out of vital signs.

## 2016-05-10 NOTE — ED Notes (Signed)
Pt was due to have cataract surgery yesterday at the [facility]. Was told that his HR was low and they couldn't do the procedure. Pt sts he wanted to get his vital signs checked out to make sure. Reports HR was 47-50 yesterday.

## 2016-07-03 ENCOUNTER — Ambulatory Visit: Payer: Non-veteran care | Admitting: Cardiology

## 2016-12-04 ENCOUNTER — Encounter (HOSPITAL_BASED_OUTPATIENT_CLINIC_OR_DEPARTMENT_OTHER): Payer: Self-pay | Admitting: *Deleted

## 2016-12-04 ENCOUNTER — Emergency Department (HOSPITAL_BASED_OUTPATIENT_CLINIC_OR_DEPARTMENT_OTHER)
Admission: EM | Admit: 2016-12-04 | Discharge: 2016-12-04 | Disposition: A | Discharge status: Home / Self Care | Payer: Non-veteran care | Attending: Emergency Medicine | Admitting: Emergency Medicine

## 2016-12-04 ENCOUNTER — Emergency Department (HOSPITAL_BASED_OUTPATIENT_CLINIC_OR_DEPARTMENT_OTHER): Payer: Non-veteran care

## 2016-12-04 DIAGNOSIS — N183 Chronic kidney disease, stage 3 (moderate): Secondary | ICD-10-CM | POA: Insufficient documentation

## 2016-12-04 DIAGNOSIS — R0789 Other chest pain: Secondary | ICD-10-CM | POA: Insufficient documentation

## 2016-12-04 DIAGNOSIS — R05 Cough: Secondary | ICD-10-CM | POA: Insufficient documentation

## 2016-12-04 DIAGNOSIS — Z7982 Long term (current) use of aspirin: Secondary | ICD-10-CM | POA: Diagnosis not present

## 2016-12-04 DIAGNOSIS — Z794 Long term (current) use of insulin: Secondary | ICD-10-CM | POA: Diagnosis not present

## 2016-12-04 DIAGNOSIS — E119 Type 2 diabetes mellitus without complications: Secondary | ICD-10-CM | POA: Diagnosis not present

## 2016-12-04 DIAGNOSIS — Z87891 Personal history of nicotine dependence: Secondary | ICD-10-CM | POA: Diagnosis not present

## 2016-12-04 DIAGNOSIS — R0602 Shortness of breath: Secondary | ICD-10-CM | POA: Diagnosis not present

## 2016-12-04 DIAGNOSIS — I129 Hypertensive chronic kidney disease with stage 1 through stage 4 chronic kidney disease, or unspecified chronic kidney disease: Secondary | ICD-10-CM | POA: Diagnosis not present

## 2016-12-04 LAB — TROPONIN I

## 2016-12-04 MED ORDER — IPRATROPIUM-ALBUTEROL 0.5-2.5 (3) MG/3ML IN SOLN
3.0000 mL | Freq: Once | RESPIRATORY_TRACT | Status: DC
  Filled 2016-12-04: qty 3

## 2016-12-04 MED ORDER — IPRATROPIUM-ALBUTEROL 0.5-2.5 (3) MG/3ML IN SOLN
3.0000 mL | Freq: Once | RESPIRATORY_TRACT | Status: AC
  Administered 2016-12-04: 3 mL via RESPIRATORY_TRACT
  Filled 2016-12-04: qty 3

## 2016-12-04 NOTE — ED Provider Notes (Signed)
[facility] Provider Note   CSN: [id number] Arrival date & time: 12/04/16  1903  By signing my name below, I, Linna Darner, attest that this documentation has been prepared under the direction and in the presence of Demetrios Loll, PA-C. Electronically Signed: Linna Darner, Scribe. 12/04/2016. 8:42 PM.  History   Chief Complaint Chief Complaint  Patient presents with  . Shortness of Breath    The history is provided by the patient. No language interpreter was used.     HPI Comments: Jose [name] is a 56 y.o. male with PMHx significant for COPD who presents to the Emergency Department complaining of persistent SOB for three weeks. He notes an associated non-productive cough and chest tightness. Pt reports he has had chest pain secondary to his cough since this morning. He has been using his albuterol inhaler (no spacer) 4-5 times a day since onset of his SOB with no improvement. He received a nebulizer breathing treatment here tonight with good relief of his shortness of breath and chest tightness. Pt reports he has woken up several times in the middle of the night over the last few weeks secondary to his SOB and believes he may have sleep apnea; he notes he has a scheduled sleep study sometime this month. He states he intends to ask for a prescription nebulizer during his next appointment at the [facility] this coming March. He feels that he needs any albuterol nebulizer because this helps him. He was seen at [facility] last week and given prednisone that he finished yesterday. He also was recently treated for sinus infection 15. Week ago. His last visit to cardiology was in November 2017 and he has an upcoming appointment soon. He is a non-smoker. He denies fever, chills, or any other associated symptoms. No pulmonologist, but he has scheduled an appointment with one for sometime in the near future.  Past Medical History:  Diagnosis Date  . Depression   . Diabetes mellitus without  complication (HCC)   . Hypertension     Patient Active Problem List   Diagnosis Date Noted  . Hyperglycemia 10/17/2015  . Uncontrolled type 2 diabetes mellitus with diabetic nephropathy, with long-term current use of insulin (HCC) 10/17/2015  . Acute renal failure superimposed on stage 3 chronic kidney disease (HCC) 10/17/2015  . Hyperkalemia 10/17/2015  . Dyslipidemia associated with type 2 diabetes mellitus (HCC) 10/17/2015  . Alcohol abuse   . Cocaine abuse 06/24/2015  . Hyponatremia 05/12/2015  . Hypertension     Past Surgical History:  Procedure Laterality Date  . CARDIAC CATHETERIZATION    . CORONARY STENT PLACEMENT    . LEFT HEART CATHETERIZATION WITH CORONARY ANGIOGRAM Bilateral 08/25/2012   Procedure: LEFT HEART CATHETERIZATION WITH CORONARY ANGIOGRAM;  Surgeon: Kathleene Hazel, MD;  Location: [facility] CATH LAB;  Service: Cardiovascular;  Laterality: Bilateral;       Home Medications    Prior to Admission medications   Medication Sig Start Date End Date Taking? Authorizing Provider  albuterol (PROVENTIL HFA;VENTOLIN HFA) 108 (90 BASE) MCG/ACT inhaler Inhale 2 puffs into the lungs every 6 (six) hours as needed for wheezing or shortness of breath.     Historical Provider, MD  aspirin EC 81 MG tablet Take 81 mg by mouth daily.    Historical Provider, MD  brimonidine (ALPHAGAN) 0.2 % ophthalmic solution Place 1 drop into both eyes 2 (two) times daily.  04/11/15   Historical Provider, MD  budesonide-formoterol (SYMBICORT) 160-4.5 MCG/ACT inhaler Inhale 2 puffs into the lungs 2 (  two) times daily.    Historical Provider, MD  Cyanocobalamin (VITAMIN B-12 PO) Take by mouth daily.    Historical Provider, MD  doxazosin (CARDURA) 8 MG tablet Take 4 mg by mouth at bedtime.    Historical Provider, MD  ferrous sulfate 325 (65 FE) MG tablet Take 325 mg by mouth daily with breakfast.    Historical Provider, MD  folic acid (FOLVITE) 1 MG tablet Take 1 tablet (1 mg total) by mouth daily.  07/25/15   Kathlen Mody, MD  glucose 4 GM chewable tablet Chew 1 tablet by mouth daily as needed for low blood sugar.    Historical Provider, MD  insulin aspart (NOVOLOG) 100 UNIT/ML injection Inject 0-9 Units into the skin 3 (three) times daily with meals. Patient not taking: Reported on 10/17/2015 07/25/15   Kathlen Mody, MD  insulin glargine (LANTUS) 100 UNIT/ML injection Inject 0.1 mLs (10 Units total) into the skin at bedtime. Patient taking differently: Inject 30 Units into the skin daily.  07/25/15   Kathlen Mody, MD  latanoprost (XALATAN) 0.005 % ophthalmic solution Place 1 drop into both eyes at bedtime.  04/11/15   Historical Provider, MD  naproxen (NAPROSYN) 500 MG tablet Take 1 tablet (500 mg total) by mouth 2 (two) times daily. 02/10/16   Shawn C Joy, PA-C  Omega-3 Fatty Acids (FISH OIL) 1000 MG CAPS Take 1,000 mg by mouth daily.     Historical Provider, MD  predniSONE (DELTASONE) 20 MG tablet Take 2 tablets (40 mg total) by mouth daily. 02/10/16   Shawn C Joy, PA-C  thiamine 100 MG tablet Take 1 tablet (100 mg total) by mouth daily. Patient not taking: Reported on 10/17/2015 07/25/15   Kathlen Mody, MD  traZODone (DESYREL) 150 MG tablet Take 150 mg by mouth at bedtime.    Historical Provider, MD    Family History Family History  Problem Relation Age of Onset  . Heart disease Mother     MI 37s    Social History Social History  Substance Use Topics  . Smoking status: Former Smoker    Types: Cigarettes    Quit date: 11/25/2007  . Smokeless tobacco: Never Used  . Alcohol use Yes     Comment: use to drink "heavy", quit 2009. 06/24/15 12 pack of beer daily.     Allergies   Patient has no known allergies.   Review of Systems Review of Systems  Constitutional: Negative for chills and fever.  Respiratory: Positive for cough, chest tightness and shortness of breath.   Cardiovascular: Positive for chest pain.  All other systems reviewed and are negative.    Physical  Exam Updated Vital Signs BP 141/88 (BP Location: Right Arm)   Pulse 62   Temp 97.7 F (36.5 C) (Oral)   Resp 18   Ht 5\' 11"  (1.803 m)   Wt 81.6 kg   SpO2 99%   BMI 25.10 kg/m   Physical Exam  Constitutional: He is oriented to person, place, and time. He appears well-developed and well-nourished. No distress.  HENT:  Head: Normocephalic and atraumatic.  Eyes: Conjunctivae and EOM are normal. Pupils are equal, round, and reactive to light.  Neck: Normal range of motion. Neck supple. No tracheal deviation present.  Cardiovascular: Normal rate, regular rhythm, normal heart sounds and intact distal pulses.  Exam reveals no gallop and no friction rub.   No murmur heard. Pulmonary/Chest: Effort normal and breath sounds normal. No respiratory distress. He has no wheezes. He exhibits no tenderness.  CTAB. No tenderness to palpation.  Abdominal: Soft. Bowel sounds are normal. He exhibits no distension. There is no tenderness. There is no rebound and no guarding.  Musculoskeletal: Normal range of motion.  Lymphadenopathy:    He has no cervical adenopathy.  Neurological: He is alert and oriented to person, place, and time.  Skin: Skin is warm and dry. Capillary refill takes less than 2 seconds.  Psychiatric: He has a normal mood and affect. His behavior is normal.  Nursing note and vitals reviewed.    ED Treatments / Results  Labs (all labs ordered are listed, but only abnormal results are displayed) Labs Reviewed  TROPONIN I    EKG  EKG Interpretation  Date/Time:  Thursday December 04 2016 19:13:50 EST Ventricular Rate:  69 PR Interval:  158 QRS Duration: 96 QT Interval:  388 QTC Calculation: 415 R Axis:   80 Text Interpretation:  Normal sinus rhythm Nonspecific ST and T wave abnormality Abnormal ECG Confirmed by Particia NearingHAVILAND MD, JULIE ([id number]) on 12/04/2016 7:19:30 PM       Radiology Dg Chest 2 View  Result Date: 12/04/2016 CLINICAL DATA:  Shortness of breath for 3 weeks.  EXAM: CHEST  2 VIEW COMPARISON:  07/23/2015 FINDINGS: Cardiomediastinal silhouette is normal. Mediastinal contours appear intact. There is no evidence of focal airspace consolidation, pleural effusion or pneumothorax. Osseous structures are without acute abnormality. Soft tissues are grossly normal. IMPRESSION: No active cardiopulmonary disease. Electronically Signed   By: Ted Mcalpineobrinka  Dimitrova M.D.   On: 12/04/2016 19:33    Procedures Procedures (including critical care time)  DIAGNOSTIC STUDIES: Oxygen Saturation is 99% on RA, normal by my interpretation.    COORDINATION OF CARE: 8:59 PM Discussed treatment plan with pt at bedside and pt agreed to plan.  Medications Ordered in ED Medications  ipratropium-albuterol (DUONEB) 0.5-2.5 (3) MG/3ML nebulizer solution 3 mL (3 mLs Nebulization Given 12/04/16 1917)     Initial Impression / Assessment and Plan / ED Course  I have reviewed the triage vital signs and the nursing notes.  Pertinent labs & imaging results that were available during my care of the patient were reviewed by me and considered in my medical decision making (see chart for details).  Clinical Course   Patient presents to the ED with cough, shortness of breath intermittently for the past 3 weeks. Patient has history of COPD. States that he thinks he needs a nebulizer machine at home because this gives him significant improvement when he comes the ED. Patient has been using her Combivent 3-4 times everyday for the past 2 weeks with little relief. Patient was given a DuoNeb in triage and has had significant improvement when I went to see him during my initial assessment. Patient x-ray was unremarkable. Patient states that he had chest discomfort secondary to his cough this morning but denies any pain since. Feel that one troponin was acceptable. Low suspicion for ACS given that cp was secondary to cough and occurred greater than 7 hours ago.Troponin was negative. EKG without any acute  changes. Patient is not hypoxic or tachypneic. He has perc positive due to age but low risk [place]. Chest pain is not pleuritic in nature. Ongoing for the past 2-3 weeks. Low suspicion for PE. Lungs are clear to auscultation bilaterally on my exam. Patient has appointment with pulmonologist scheduled. He also sleep study order for the next 2 weeks to rule out sleep apnea. Patient is going to follow up with his primary care doctor tomorrow to see if they  can order him a nebulizer machine for home. Patient feels significantly improved after treatment in the ED. He denies any pain or shortness of breath this time. Patient feels safe for discharge he does not appear to be in any respiratory distress at this time. Pt is hemodynamically stable, in NAD, & able to ambulate in the ED. Pain has been managed & has no complaints prior to dc. Pt is comfortable with above plan and is stable for discharge at this time. All questions were answered prior to disposition. Strict return precautions for f/u to the ED were discussed.  Final Clinical Impressions(s) / ED Diagnoses   Final diagnoses:  SOB (shortness of breath)    New Prescriptions Discharge Medication List as of 12/04/2016 10:11 PM     I personally performed the services described in this documentation, which was scribed in my presence. The recorded information has been reviewed and is accurate.    Rise Mu, PA-C 12/05/16 1610    Jacalyn Lefevre, MD 12/09/16 [phone]

## 2016-12-04 NOTE — Discharge Instructions (Signed)
Your chest x-ray shows no signs of pneumonia today. Your heart enzyme test was normal. Your EKG without any acute signs of a heart attack. I feel that he would possibly benefit from a nebulizer inhaler. I encourage you to follow up with your primary care doctor for this. Continue using her inhaler as needed. I recommend that he follow up with their sleep study. Return to the ED if he develops any exertional chest pain, worsening shortness of breath, fevers or for any other reason.

## 2016-12-04 NOTE — ED Triage Notes (Addendum)
Sob 3 weeks. He has been using his inhaler more than he normally does. He had a negative cxr at [facility] last week. He was given Prednisone. He took antibiotics 1.5 weeks ago for sinus infection.

## 2017-08-28 ENCOUNTER — Emergency Department (HOSPITAL_COMMUNITY): Payer: Non-veteran care

## 2017-08-28 ENCOUNTER — Observation Stay (HOSPITAL_COMMUNITY)
Admission: EM | Admit: 2017-08-28 | Discharge: 2017-08-30 | Disposition: A | Discharge status: Home / Self Care | Payer: Non-veteran care | Attending: Internal Medicine | Admitting: Internal Medicine

## 2017-08-28 ENCOUNTER — Encounter (HOSPITAL_COMMUNITY): Payer: Self-pay | Admitting: Emergency Medicine

## 2017-08-28 DIAGNOSIS — N1832 Chronic kidney disease, stage 3b: Secondary | ICD-10-CM

## 2017-08-28 DIAGNOSIS — J449 Chronic obstructive pulmonary disease, unspecified: Secondary | ICD-10-CM

## 2017-08-28 DIAGNOSIS — E118 Type 2 diabetes mellitus with unspecified complications: Secondary | ICD-10-CM

## 2017-08-28 DIAGNOSIS — E1121 Type 2 diabetes mellitus with diabetic nephropathy: Secondary | ICD-10-CM

## 2017-08-28 DIAGNOSIS — I129 Hypertensive chronic kidney disease with stage 1 through stage 4 chronic kidney disease, or unspecified chronic kidney disease: Secondary | ICD-10-CM | POA: Insufficient documentation

## 2017-08-28 DIAGNOSIS — R079 Chest pain, unspecified: Secondary | ICD-10-CM | POA: Diagnosis not present

## 2017-08-28 DIAGNOSIS — F1010 Alcohol abuse, uncomplicated: Secondary | ICD-10-CM | POA: Diagnosis not present

## 2017-08-28 DIAGNOSIS — N183 Chronic kidney disease, stage 3 unspecified: Secondary | ICD-10-CM

## 2017-08-28 DIAGNOSIS — F1410 Cocaine abuse, uncomplicated: Secondary | ICD-10-CM | POA: Diagnosis not present

## 2017-08-28 DIAGNOSIS — Z7982 Long term (current) use of aspirin: Secondary | ICD-10-CM | POA: Insufficient documentation

## 2017-08-28 DIAGNOSIS — I208 Other forms of angina pectoris: Secondary | ICD-10-CM

## 2017-08-28 DIAGNOSIS — E1165 Type 2 diabetes mellitus with hyperglycemia: Secondary | ICD-10-CM | POA: Diagnosis not present

## 2017-08-28 DIAGNOSIS — Z794 Long term (current) use of insulin: Secondary | ICD-10-CM | POA: Insufficient documentation

## 2017-08-28 DIAGNOSIS — E1159 Type 2 diabetes mellitus with other circulatory complications: Secondary | ICD-10-CM | POA: Diagnosis present

## 2017-08-28 DIAGNOSIS — R739 Hyperglycemia, unspecified: Secondary | ICD-10-CM | POA: Diagnosis not present

## 2017-08-28 DIAGNOSIS — E119 Type 2 diabetes mellitus without complications: Secondary | ICD-10-CM

## 2017-08-28 DIAGNOSIS — E871 Hypo-osmolality and hyponatremia: Secondary | ICD-10-CM | POA: Diagnosis not present

## 2017-08-28 DIAGNOSIS — Z955 Presence of coronary angioplasty implant and graft: Secondary | ICD-10-CM | POA: Insufficient documentation

## 2017-08-28 DIAGNOSIS — I10 Essential (primary) hypertension: Secondary | ICD-10-CM | POA: Diagnosis present

## 2017-08-28 DIAGNOSIS — I2510 Atherosclerotic heart disease of native coronary artery without angina pectoris: Secondary | ICD-10-CM | POA: Diagnosis not present

## 2017-08-28 DIAGNOSIS — Z87891 Personal history of nicotine dependence: Secondary | ICD-10-CM | POA: Insufficient documentation

## 2017-08-28 HISTORY — DX: Patient's noncompliance with other medical treatment and regimen due to unspecified reason: Z91.199

## 2017-08-28 HISTORY — DX: Atherosclerotic heart disease of native coronary artery without angina pectoris: I25.10

## 2017-08-28 HISTORY — DX: Chronic kidney disease, stage 3 unspecified: N18.30

## 2017-08-28 HISTORY — DX: Patient's noncompliance with other medical treatment and regimen: Z91.19

## 2017-08-28 HISTORY — DX: Chronic obstructive pulmonary disease, unspecified: J44.9

## 2017-08-28 HISTORY — DX: Hyperlipidemia, unspecified: E78.5

## 2017-08-28 HISTORY — DX: Chronic kidney disease, stage 3 (moderate): N18.3

## 2017-08-28 LAB — CBC
HEMATOCRIT: 37.3 % — AB (ref 39.0–52.0)
HEMOGLOBIN: 13.9 g/dL (ref 13.0–17.0)
MCH: 28.1 pg (ref 26.0–34.0)
MCHC: 37.3 g/dL — ABNORMAL HIGH (ref 30.0–36.0)
MCV: 75.4 fL — AB (ref 78.0–100.0)
Platelets: 160 10*3/uL (ref 150–400)
RBC: 4.95 MIL/uL (ref 4.22–5.81)
RDW: 12.7 % (ref 11.5–15.5)
WBC: 5.4 10*3/uL (ref 4.0–10.5)

## 2017-08-28 LAB — URINALYSIS, ROUTINE W REFLEX MICROSCOPIC
BACTERIA UA: NONE SEEN
BILIRUBIN URINE: NEGATIVE
Glucose, UA: >=500 mg/dL — AB
Hgb urine dipstick: NEGATIVE
Ketones, ur: NEGATIVE mg/dL
Leukocytes, UA: NEGATIVE
NITRITE: NEGATIVE
Protein, ur: NEGATIVE mg/dL
SPECIFIC GRAVITY, URINE: 1.007 (ref 1.005–1.030)
SQUAMOUS EPITHELIAL / LPF: NONE SEEN
pH: 5 (ref 5.0–8.0)

## 2017-08-28 LAB — BRAIN NATRIURETIC PEPTIDE: B Natriuretic Peptide: 22 pg/mL (ref 0.0–100.0)

## 2017-08-28 LAB — GLUCOSE, CAPILLARY: GLUCOSE-CAPILLARY: 360 mg/dL — AB (ref 65–99)

## 2017-08-28 LAB — BASIC METABOLIC PANEL
ANION GAP: 13 (ref 5–15)
BUN: 24 mg/dL — AB (ref 6–20)
CHLORIDE: 89 mmol/L — AB (ref 101–111)
CO2: 15 mmol/L — AB (ref 22–32)
Calcium: 8.9 mg/dL (ref 8.9–10.3)
Creatinine, Ser: 1.68 mg/dL — ABNORMAL HIGH (ref 0.61–1.24)
GFR calc Af Amer: 50 mL/min — ABNORMAL LOW (ref >60)
GFR calc non Af Amer: 43 mL/min — ABNORMAL LOW (ref >60)
GLUCOSE: 292 mg/dL — AB (ref 65–99)
POTASSIUM: 4.4 mmol/L (ref 3.5–5.1)
Sodium: 117 mmol/L — CL (ref 135–145)

## 2017-08-28 LAB — RAPID URINE DRUG SCREEN, HOSP PERFORMED
AMPHETAMINES: NOT DETECTED
BENZODIAZEPINES: NOT DETECTED
Barbiturates: NOT DETECTED
Cocaine: POSITIVE — AB
OPIATES: NOT DETECTED
Tetrahydrocannabinol: NOT DETECTED

## 2017-08-28 LAB — TROPONIN I: TROPONIN I: 0.03 ng/mL — AB (ref <0.03)

## 2017-08-28 LAB — I-STAT TROPONIN, ED: TROPONIN I, POC: 0.1 ng/mL — AB (ref 0.00–0.08)

## 2017-08-28 LAB — ETHANOL: Alcohol, Ethyl (B): <10 mg/dL (ref <10)

## 2017-08-28 LAB — HEMOGLOBIN A1C
HEMOGLOBIN A1C: 9.7 % — AB (ref 4.8–5.6)
MEAN PLASMA GLUCOSE: 231.69 mg/dL

## 2017-08-28 LAB — D-DIMER, QUANTITATIVE (NOT AT ARMC): D DIMER QUANT: 0.4 ug{FEU}/mL (ref 0.00–0.50)

## 2017-08-28 MED ORDER — BRIMONIDINE TARTRATE 0.2 % OP SOLN
1.0000 [drp] | Freq: Two times a day (BID) | OPHTHALMIC | Status: DC
  Administered 2017-08-28 – 2017-08-30 (×4): 1 [drp] via OPHTHALMIC
  Filled 2017-08-28: qty 5

## 2017-08-28 MED ORDER — HEPARIN BOLUS VIA INFUSION
4000.0000 [IU] | Freq: Once | INTRAVENOUS | Status: AC
  Administered 2017-08-28: 4000 [IU] via INTRAVENOUS
  Filled 2017-08-28: qty 4000

## 2017-08-28 MED ORDER — CHLORDIAZEPOXIDE HCL 25 MG PO CAPS
100.0000 mg | ORAL_CAPSULE | Freq: Once | ORAL | Status: AC
  Administered 2017-08-28: 100 mg via ORAL
  Filled 2017-08-28: qty 4

## 2017-08-28 MED ORDER — SODIUM CHLORIDE 0.9 % IV SOLN
INTRAVENOUS | Status: DC
  Administered 2017-08-28: 23:00:00 via INTRAVENOUS

## 2017-08-28 MED ORDER — DEXTROSE-NACL 5-0.45 % IV SOLN: INTRAVENOUS | Status: DC

## 2017-08-28 MED ORDER — INSULIN ASPART 100 UNIT/ML ~~LOC~~ SOLN
0.0000 [IU] | Freq: Every day | SUBCUTANEOUS | Status: DC
  Administered 2017-08-28: 5 [IU] via SUBCUTANEOUS

## 2017-08-28 MED ORDER — INSULIN ASPART 100 UNIT/ML ~~LOC~~ SOLN
0.0000 [IU] | Freq: Three times a day (TID) | SUBCUTANEOUS | Status: DC
  Administered 2017-08-29: 8 [IU] via SUBCUTANEOUS
  Administered 2017-08-30: 3 [IU] via SUBCUTANEOUS
  Administered 2017-08-30: 8 [IU] via SUBCUTANEOUS

## 2017-08-28 MED ORDER — GI COCKTAIL ~~LOC~~
30.0000 mL | Freq: Once | ORAL | Status: AC
Start: 2017-08-28 — End: 2017-08-28
  Administered 2017-08-28: 30 mL via ORAL
  Filled 2017-08-28: qty 30

## 2017-08-28 MED ORDER — DEXTROSE 50 % IV SOLN: 25.0000 mL | INTRAVENOUS | Status: DC | PRN

## 2017-08-28 MED ORDER — ASPIRIN 81 MG PO CHEW
324.0000 mg | CHEWABLE_TABLET | Freq: Once | ORAL | Status: AC
  Administered 2017-08-28: 324 mg via ORAL
  Filled 2017-08-28: qty 4

## 2017-08-28 MED ORDER — LATANOPROST 0.005 % OP SOLN
1.0000 [drp] | Freq: Every day | OPHTHALMIC | Status: DC
  Administered 2017-08-28 – 2017-08-29 (×2): 1 [drp] via OPHTHALMIC
  Filled 2017-08-28: qty 2.5

## 2017-08-28 MED ORDER — ALBUTEROL SULFATE (2.5 MG/3ML) 0.083% IN NEBU
3.0000 mL | INHALATION_SOLUTION | Freq: Four times a day (QID) | RESPIRATORY_TRACT | Status: DC | PRN
  Administered 2017-08-29: 3 mL via RESPIRATORY_TRACT
  Filled 2017-08-28: qty 3

## 2017-08-28 MED ORDER — SODIUM CHLORIDE 0.9 % IV SOLN: 250.0000 mL | INTRAVENOUS | Status: DC | PRN

## 2017-08-28 MED ORDER — MOMETASONE FURO-FORMOTEROL FUM 200-5 MCG/ACT IN AERO
2.0000 | INHALATION_SPRAY | Freq: Two times a day (BID) | RESPIRATORY_TRACT | Status: DC
  Administered 2017-08-30: 2 via RESPIRATORY_TRACT
  Filled 2017-08-28 (×2): qty 8.8

## 2017-08-28 MED ORDER — DOXAZOSIN MESYLATE 4 MG PO TABS
4.0000 mg | ORAL_TABLET | Freq: Every day | ORAL | Status: DC
  Administered 2017-08-28 – 2017-08-29 (×2): 4 mg via ORAL
  Filled 2017-08-28 (×2): qty 1

## 2017-08-28 MED ORDER — SODIUM CHLORIDE 0.9 % IV BOLUS (SEPSIS)
2000.0000 mL | Freq: Once | INTRAVENOUS | Status: AC
  Administered 2017-08-28: 2000 mL via INTRAVENOUS

## 2017-08-28 MED ORDER — FOLIC ACID 1 MG PO TABS
1.0000 mg | ORAL_TABLET | Freq: Every day | ORAL | Status: DC
  Administered 2017-08-28 – 2017-08-30 (×3): 1 mg via ORAL
  Filled 2017-08-28 (×3): qty 1

## 2017-08-28 MED ORDER — HEPARIN (PORCINE) IN NACL 100-0.45 UNIT/ML-% IJ SOLN
1000.0000 [IU]/h | INTRAMUSCULAR | Status: DC
  Administered 2017-08-28: 950 [IU]/h via INTRAVENOUS
  Filled 2017-08-28: qty 250

## 2017-08-28 MED ORDER — VITAMIN B-1 100 MG PO TABS
100.0000 mg | ORAL_TABLET | Freq: Every day | ORAL | Status: DC
  Administered 2017-08-28 – 2017-08-30 (×3): 100 mg via ORAL
  Filled 2017-08-28 (×3): qty 1

## 2017-08-28 NOTE — ED Triage Notes (Signed)
Pt states he was sent by his doctor at the [facility] for shortness of breath and low sodium, pt unsure what his sodium level is. Pt states he has been short of breath for "a couple days", worse with exertion and lying flat. Hx COPD.

## 2017-08-28 NOTE — ED Provider Notes (Signed)
[facility]-EMER[facility]ENCY DEPT Provider Note   CSN: [id number] Arrival date & time: 08/28/17  1503     History   Chief Complaint Chief Complaint  Patient presents with  . Shortness of Breath  . Abnormal Lab    HPI Jose [name] is a 56 y.o. male.  56 yo M with a chief complaint of chest pain. Patient saw his provider at the [place] today. He has been having some mild dizziness over the past week. Has been drinking beer heavily as well. Was noted to have a sodium level that was less than 120 and was told to come to the ED. States that his chest pain is sharp. Has some radiation down the left arm. Feels different from his MI in the past until this afternoon and thought it felt similar.  Pain fluctuates.     The history is provided by the patient.  Shortness of Breath  This is a new problem. The average episode lasts 1 week. The problem occurs continuously.The current episode started more than 2 days ago. The problem has not changed since onset.Associated symptoms include chest pain and abdominal pain. Pertinent negatives include no fever, no headaches, no vomiting and no rash. He has tried nothing for the symptoms. The treatment provided no relief. He has had no prior hospitalizations. He has had prior ED visits.  Abnormal Lab    Past Medical History:  Diagnosis Date  . Alcohol abuse   . Cocaine abuse (HCC)   . Depression   . Diabetes mellitus without complication (HCC)   . Hypertension   . Myocardial infarction [facility])    2 stents ~2012    Patient Active Problem List   Diagnosis Date Noted  . Chest pain 08/28/2017  . CKD (chronic kidney disease) stage 3, GFR 30-59 ml/min (HCC) 08/28/2017  . COPD (chronic obstructive pulmonary disease) (HCC) 08/28/2017  . Hyperglycemia 10/17/2015  . Uncontrolled type 2 diabetes mellitus with diabetic nephropathy, with long-term current use of insulin (HCC) 10/17/2015  . Acute renal failure superimposed on stage 3 chronic kidney disease (HCC) 10/17/2015  .  Hyperkalemia 10/17/2015  . Dyslipidemia associated with type 2 diabetes mellitus (HCC) 10/17/2015  . Alcohol abuse   . Cocaine abuse (HCC) 06/24/2015  . Hyponatremia 05/12/2015  . Hypertension     Past Surgical History:  Procedure Laterality Date  . CARDIAC CATHETERIZATION    . CORONARY STENT PLACEMENT    . LEFT HEART CATHETERIZATION WITH CORONARY ANGIOGRAM Bilateral 08/25/2012   Procedure: LEFT HEART CATHETERIZATION WITH CORONARY ANGIOGRAM;  Surgeon: Kathleene Hazel, MD;  Location: [facility] CATH LAB;  Service: Cardiovascular;  Laterality: Bilateral;       Home Medications    Prior to Admission medications   Medication Sig Start Date End Date Taking? Authorizing Provider  acetaminophen (TYLENOL) 325 MG tablet Take 325 mg by mouth every 6 (six) hours as needed for headache (pain).   Yes [provider]  albuterol (PROVENTIL HFA;VENTOLIN HFA) 108 (90 BASE) MCG/ACT inhaler Inhale 2 puffs into the lungs every 6 (six) hours as needed for wheezing or shortness of breath.    Yes [provider]  aspirin EC 81 MG tablet Take 81 mg by mouth daily.   Yes [provider]  brimonidine (ALPHAGAN) 0.2 % ophthalmic solution Place 1 drop into both eyes 2 (two) times daily.  04/11/15  Yes [provider]  budesonide-formoterol (SYMBICORT) 160-4.5 MCG/ACT inhaler Inhale 2 puffs into the lungs 2 (two) times daily.   Yes [provider]  Cyanocobalamin (VITAMIN B-12 PO) Take 1 tablet by mouth daily.    Yes [provider]  ferrous sulfate 325 (65 FE) MG tablet Take 325 mg by mouth daily with breakfast.   Yes [provider]  glucose 4 GM chewable tablet Chew 4 g by mouth daily as needed for low blood sugar.    Yes [provider]  insulin glargine (LANTUS) 100 UNIT/ML injection Inject 0.1 mLs (10 Units total) into the skin at bedtime. Patient taking differently: Inject 30 Units into the skin daily before breakfast.  07/25/15  Yes  Kathlen Mody, MD  latanoprost (XALATAN) 0.005 % ophthalmic solution Place 1 drop into both eyes at bedtime.  04/11/15  Yes [provider]  lisinopril (PRINIVIL,ZESTRIL) 10 MG tablet Take 20 mg by mouth daily.   Yes [provider]  Omega-3 Fatty Acids (FISH OIL) 1000 MG CAPS Take 1,000 mg by mouth 2 (two) times daily.    Yes [provider]  traZODone (DESYREL) 150 MG tablet Take 150 mg by mouth at bedtime.   Yes [provider]  doxazosin (CARDURA) 8 MG tablet Take 4 mg by mouth at bedtime.    [provider]  folic acid (FOLVITE) 1 MG tablet Take 1 tablet (1 mg total) by mouth daily. 07/25/15   Kathlen Mody, MD  insulin aspart (NOVOLOG) 100 UNIT/ML injection Inject 0-9 Units into the skin 3 (three) times daily with meals. Patient not taking: Reported on 10/17/2015 07/25/15   Kathlen Mody, MD  naproxen (NAPROSYN) 500 MG tablet Take 1 tablet (500 mg total) by mouth 2 (two) times daily. Patient not taking: Reported on 08/28/2017 02/10/16   Anselm Pancoast, PA-C  thiamine 100 MG tablet Take 1 tablet (100 mg total) by mouth daily. Patient not taking: Reported on 10/17/2015 07/25/15   Kathlen Mody, MD    Family History Family History  Problem Relation Age of Onset  . Heart disease Mother        MI 38s    Social History Social History  Substance Use Topics  . Smoking status: Former Smoker    Types: Cigarettes    Quit date: 11/25/2007  . Smokeless tobacco: Never Used  . Alcohol use Yes     Comment: use to drink "heavy", quit 2009. 06/24/15 12 pack of beer daily.     Allergies   Simvastatin   Review of Systems Review of Systems  Constitutional: Negative for chills and fever.  HENT: Negative for congestion and facial swelling.   Eyes: Negative for discharge and visual disturbance.  Respiratory: Negative for shortness of breath.   Cardiovascular: Positive for chest pain. Negative for palpitations.  Gastrointestinal: Positive for abdominal  pain. Negative for diarrhea and vomiting.  Musculoskeletal: Negative for arthralgias and myalgias.  Skin: Negative for color change and rash.  Neurological: Positive for dizziness. Negative for tremors, syncope and headaches.  Psychiatric/Behavioral: Negative for confusion and dysphoric mood.     Physical Exam Updated Vital Signs BP 121/77   Pulse (!) 59   Temp 98.1 F (36.7 C) (Oral)   Resp 17   Ht  (1.803 m)   Wt 78 kg (172 lb)   SpO2 98%   BMI 23.99 kg/m   Physical Exam  Constitutional: He is oriented to person, place, and time. He appears well-developed and well-nourished.  HENT:  Head: Normocephalic and atraumatic.  Eyes: Pupils are equal, round, and reactive to light. EOM are normal.  Neck: Normal range of motion. Neck supple. No  JVD present.  Cardiovascular: Normal rate and regular rhythm.  Exam reveals no gallop and no friction rub.   No murmur heard. Pulmonary/Chest: No respiratory distress. He has no wheezes. He exhibits no tenderness.  Abdominal: He exhibits no distension and no mass. There is tenderness (mild epigastric). There is no rebound and no guarding.  Musculoskeletal: Normal range of motion.  Neurological: He is alert and oriented to person, place, and time.  Skin: No rash noted. No pallor.  Psychiatric: He has a normal mood and affect. His behavior is normal.  Nursing note and vitals reviewed.    ED Treatments / Results  Labs (all labs ordered are listed, but only abnormal results are displayed) Labs Reviewed  BASIC METABOLIC PANEL - Abnormal; Notable for the following:       Result Value   Sodium 117 (*)    Chloride 89 (*)    CO2 15 (*)    Glucose, Bld 292 (*)    BUN 24 (*)    Creatinine, Ser 1.68 (*)    GFR calc non Af Amer 43 (*)    GFR calc Af Amer 50 (*)    All other components within normal limits  CBC - Abnormal; Notable for the following:    HCT 37.3 (*)    MCV 75.4 (*)    MCHC 37.3 (*)    All other components within normal  limits  URINALYSIS, ROUTINE W REFLEX MICROSCOPIC - Abnormal; Notable for the following:    Glucose, UA >=500 (*)    All other components within normal limits  RAPID URINE DRUG SCREEN, HOSP PERFORMED - Abnormal; Notable for the following:    Cocaine POSITIVE (*)    All other components within normal limits  TROPONIN I - Abnormal; Notable for the following:    Troponin I 0.03 (*)    All other components within normal limits  HEMOGLOBIN A1C - Abnormal; Notable for the following:    Hgb A1c MFr Bld 9.7 (*)    All other components within normal limits  I-STAT TROPONIN, ED - Abnormal; Notable for the following:    Troponin i, poc 0.10 (*)    All other components within normal limits  MRSA PCR SCREENING  D-DIMER, QUANTITATIVE (NOT AT [facility])  BRAIN NATRIURETIC PEPTIDE  ETHANOL  HEPARIN LEVEL (UNFRACTIONATED)  CBC  HIV ANTIBODY (ROUTINE TESTING)  TROPONIN I  TROPONIN I  PROTIME-INR  BASIC METABOLIC PANEL  SODIUM, URINE, RANDOM  OSMOLALITY, URINE    EKG  EKG Interpretation  Date/Time:  Friday August 28 2017 15:15:19 EDT Ventricular Rate:  90 PR Interval:  156 QRS Duration: 88 QT Interval:  358 QTC Calculation: 437 R Axis:   65 Text Interpretation:  Normal sinus rhythm Normal ECG No significant change since last tracing Confirmed by Melene Plan [phone]) on 08/28/2017 4:26:02 PM       Radiology Dg Chest 2 View  Result Date: 08/28/2017 CLINICAL DATA:  Shortness of breath for 1 week. Chest pain on and off. EXAM: CHEST  2 VIEW COMPARISON:  12/04/2016 FINDINGS: Both lungs are clear. Negative for a pneumothorax. Heart and mediastinum are within normal limits. Trachea is midline. Bone structures are unremarkable. IMPRESSION: No active cardiopulmonary disease. Electronically Signed   By: Richarda Overlie M.D.   On: 08/28/2017 15:50    Procedures Procedures (including critical care time)  Medications Ordered in ED Medications  heparin ADULT infusion 100 units/mL (25000 units/272mL sodium  chloride 0.45%) (950 Units/hr Intravenous New Bag/Given 08/28/17 2005)  doxazosin (CARDURA)  tablet 4 mg (not administered)  folic acid (FOLVITE) tablet 1 mg (not administered)  thiamine (VITAMIN B-1) tablet 100 mg (not administered)  brimonidine (ALPHAGAN) 0.2 % ophthalmic solution 1 drop (not administered)  latanoprost (XALATAN) 0.005 % ophthalmic solution 1 drop (not administered)  albuterol (PROVENTIL) (2.5 MG/3ML) 0.083% nebulizer solution 3 mL (not administered)  mometasone-formoterol (DULERA) 200-5 MCG/ACT inhaler 2 puff (2 puffs Inhalation Not Given 08/28/17 2234)  0.9 %  sodium chloride infusion (not administered)  dextrose 5 %-0.45 % sodium chloride infusion (not administered)  dextrose 50 % solution 25 mL (not administered)  0.9 %  sodium chloride infusion (not administered)  insulin aspart (novoLOG) injection 0-15 Units (not administered)  insulin aspart (novoLOG) injection 0-5 Units (not administered)  sodium chloride 0.9 % bolus 2,000 mL (0 mLs Intravenous Stopped 08/28/17 2008)  gi cocktail (Maalox,Lidocaine,Donnatal) (30 mLs Oral Given 08/28/17 1744)  chlordiazePOXIDE (LIBRIUM) capsule 100 mg (100 mg Oral Given 08/28/17 1743)  aspirin chewable tablet 324 mg (324 mg Oral Given 08/28/17 1834)  heparin bolus via infusion 4,000 Units (4,000 Units Intravenous Bolus from Bag 08/28/17 2008)     Initial Impression / Assessment and Plan / ED Course  I have reviewed the triage vital signs and the nursing notes.  Pertinent labs & imaging results that were available during my care of the patient were reviewed by me and considered in my medical decision making (see chart for details).     56 yo M with a chief complaint of chest pain. Was sent from the [facility] for evaluation. Atypical from ACS though similar to prior.will give a GI cocktail check a troponin.clinically he appears dehydrated.  Hx of beer drinker potomania.  Give the patient fluids and reassess.   Trop positive.  UDS added.  Will  start on heparin, asa.   I discussed the case with cardiology, Dr. Rennis Golden. With his concurrent issues with hyponatremia he recommended hospitalist admission with cardiology following and consult. He feels the patient likely needs a Myoview with his history of prior and STEMI and elevated troponin with recent cocaine abuse.  CRITICAL CARE Performed by: Rae Roam   Total critical care time: 35 minutes  Critical care time was exclusive of separately billable procedures and treating other patients.  Critical care was necessary to treat or prevent imminent or life-threatening deterioration.  Critical care was time spent personally by me on the following activities: development of treatment plan with patient and/or surrogate as well as nursing, discussions with consultants, evaluation of patient's response to treatment, examination of patient, obtaining history from patient or surrogate, ordering and performing treatments and interventions, ordering and review of laboratory studies, ordering and review of radiographic studies, pulse oximetry and re-evaluation of patient's condition.  The patients results and plan were reviewed and discussed.   Any x-rays performed were independently reviewed by myself.   Differential diagnosis were considered with the presenting HPI.  Medications  heparin ADULT infusion 100 units/mL (25000 units/271mL sodium chloride 0.45%) (950 Units/hr Intravenous New Bag/Given 08/28/17 2005)  doxazosin (CARDURA) tablet 4 mg (not administered)  folic acid (FOLVITE) tablet 1 mg (not administered)  thiamine (VITAMIN B-1) tablet 100 mg (not administered)  brimonidine (ALPHAGAN) 0.2 % ophthalmic solution 1 drop (not administered)  latanoprost (XALATAN) 0.005 % ophthalmic solution 1 drop (not administered)  albuterol (PROVENTIL) (2.5 MG/3ML) 0.083% nebulizer solution 3 mL (not administered)  mometasone-formoterol (DULERA) 200-5 MCG/ACT inhaler 2 puff (2 puffs Inhalation  Not Given 08/28/17 2234)  0.9 %  sodium chloride  infusion (not administered)  dextrose 5 %-0.45 % sodium chloride infusion (not administered)  dextrose 50 % solution 25 mL (not administered)  0.9 %  sodium chloride infusion (not administered)  insulin aspart (novoLOG) injection 0-15 Units (not administered)  insulin aspart (novoLOG) injection 0-5 Units (not administered)  sodium chloride 0.9 % bolus 2,000 mL (0 mLs Intravenous Stopped 08/28/17 2008)  gi cocktail (Maalox,Lidocaine,Donnatal) (30 mLs Oral Given 08/28/17 1744)  chlordiazePOXIDE (LIBRIUM) capsule 100 mg (100 mg Oral Given 08/28/17 1743)  aspirin chewable tablet 324 mg (324 mg Oral Given 08/28/17 1834)  heparin bolus via infusion 4,000 Units (4,000 Units Intravenous Bolus from Bag 08/28/17 2008)    Vitals:   08/28/17 2030 08/28/17 2100 08/28/17 2115 08/28/17 2130  BP: (!) 141/73 133/79 122/74 121/77  Pulse: 64 (!) 58 60 (!) 59  Resp: Temp:      TempSrc:      SpO2: 100% 99% 99% 98%  Weight:      Height:        Final diagnoses:  Chest pain with high risk for cardiac etiology    Admission/ observation were discussed with the admitting physician, patient and/or family and they are comfortable with the plan.    Final Clinical Impressions(s) / ED Diagnoses   Final diagnoses:  Chest pain with high risk for cardiac etiology    New Prescriptions Current Discharge Medication List       Melene Plan, DO 08/28/17 2244

## 2017-08-28 NOTE — H&P (Addendum)
History and Physical    Graviel [name] WUJ:[id number] DOB: 10/04/1961 DOA: 08/28/2017  PCP: Posey Rea, but says is at [facility]  Patient coming from: [place] in [place]   Chief Complaint: chest pain  HPI: Jose [name] is a 56 y.o. male with medical history significant for see [facility].  Generally homeless but recently moved into an apartment.  Recently clean of drugs/alcohol for a year with recnet relapse. Having a couple beers a day this week. Also using cocaine this week, last used yesterday or day before.  Told pcp at the [place] today that has been having chest pain and feeling weak. Labs performed, hyonatremic. [facility] and told to take [facility] and go to hospital.   This past week having sharp dull pain left chest intermittent as well as intermittent left arn numbness. No pain currently. Also getting short of breath at night when lies down. Going up stairs reproduces pain. No leg swelling or recent immobilization.  Denies history alcohol withdrawal.  ED Course: labs, librium, cxr, heparin gtt, cardiology consult  Review of Systems: As per HPI otherwise 10 point review of systems negative.    Past Medical History:  Diagnosis Date  . Alcohol abuse   . Cocaine abuse (HCC)   . Depression   . Diabetes mellitus without complication (HCC)   . Hypertension   . Myocardial infarction [facility])    2 stents ~2012    Past Surgical History:  Procedure Laterality Date  . CARDIAC CATHETERIZATION    . CORONARY STENT PLACEMENT    . LEFT HEART CATHETERIZATION WITH CORONARY ANGIOGRAM Bilateral 08/25/2012   Procedure: LEFT HEART CATHETERIZATION WITH CORONARY ANGIOGRAM;  Surgeon: Kathleene Hazel, MD;  Location: [facility] CATH LAB;  Service: Cardiovascular;  Laterality: Bilateral;     reports that he quit smoking about 9 years ago. His smoking use included Cigarettes. He has never used smokeless tobacco. He reports that he drinks alcohol. He reports that he  uses drugs, including Cocaine.  No Known Allergies  Family History  Problem Relation Age of Onset  . Heart disease Mother        MI 69s    Prior to Admission medications   Medication Sig Start Date End Date Taking? Authorizing Provider  albuterol (PROVENTIL HFA;VENTOLIN HFA) 108 (90 BASE) MCG/ACT inhaler Inhale 2 puffs into the lungs every 6 (six) hours as needed for wheezing or shortness of breath.     [provider]  aspirin EC 81 MG tablet Take 81 mg by mouth daily.    [provider]  brimonidine (ALPHAGAN) 0.2 % ophthalmic solution Place 1 drop into both eyes 2 (two) times daily.  04/11/15   [provider]  budesonide-formoterol (SYMBICORT) 160-4.5 MCG/ACT inhaler Inhale 2 puffs into the lungs 2 (two) times daily.    [provider]  Cyanocobalamin (VITAMIN B-12 PO) Take by mouth daily.    [provider]  doxazosin (CARDURA) 8 MG tablet Take 4 mg by mouth at bedtime.    [provider]  ferrous sulfate 325 (65 FE) MG tablet Take 325 mg by mouth daily with breakfast.    [provider]  folic acid (FOLVITE) 1 MG tablet Take 1 tablet (1 mg total) by mouth daily. 07/25/15   Kathlen Mody, MD  glucose 4 GM chewable tablet Chew 1 tablet by mouth daily as needed for low blood sugar.    [provider]  insulin aspart (NOVOLOG) 100 UNIT/ML injection Inject  0-9 Units into the skin 3 (three) times daily with meals. Patient not taking: Reported on 10/17/2015 07/25/15   Kathlen Mody, MD  insulin glargine (LANTUS) 100 UNIT/ML injection Inject 0.1 mLs (10 Units total) into the skin at bedtime. Patient taking differently: Inject 30 Units into the skin daily.  07/25/15   Kathlen Mody, MD  latanoprost (XALATAN) 0.005 % ophthalmic solution Place 1 drop into both eyes at bedtime.  04/11/15   [provider]  naproxen (NAPROSYN) 500 MG tablet Take 1 tablet (500 mg total) by mouth 2 (two) times daily. 02/10/16   Joy, Shawn C,  PA-C  Omega-3 Fatty Acids (FISH OIL) 1000 MG CAPS Take 1,000 mg by mouth daily.     [provider]  predniSONE (DELTASONE) 20 MG tablet Take 2 tablets (40 mg total) by mouth daily. 02/10/16   Joy, Shawn C, PA-C  thiamine 100 MG tablet Take 1 tablet (100 mg total) by mouth daily. Patient not taking: Reported on 10/17/2015 07/25/15   Kathlen Mody, MD  traZODone (DESYREL) 150 MG tablet Take 150 mg by mouth at bedtime.    [provider]    Physical Exam: Vitals:   08/28/17 1915 08/28/17 1930 08/28/17 1945 08/28/17 2000  BP: (!) 153/79 (!) 145/82 136/77 109/85  Pulse: (!) 58 68 65 64  Resp: Temp:      TempSrc:      SpO2: 100% 99% 98% 99%  Weight:      Height:        Constitutional: NAD, calm, comfortable Vitals:   08/28/17 1915 08/28/17 1930 08/28/17 1945 08/28/17 2000  BP: (!) 153/79 (!) 145/82 136/77 109/85  Pulse: (!) 58 68 65 64  Resp: Temp:      TempSrc:      SpO2: 100% 99% 98% 99%  Weight:      Height:       Eyes: PERRL, lids and conjunctivae normal ENMT: Mucous membranes are moist.  Neck: normal, supple, no masses, no thyromegaly Respiratory: clear to auscultation bilaterally, no wheezing, no crackles. Normal respiratory effort. No accessory muscle use.  Cardiovascular: distant heart sounds, mild systolic murmur, no extra heart sounds, rrr Abdomen: no tenderness, no masses palpated. No hepatosplenomegaly. Bowel sounds positive.  Musculoskeletal: no clubbing / cyanosis. No joint deformity upper and lower extremities. Good ROM, no contractures. Normal muscle tone.  Skin: no rashes, lesions, ulcers. No induration Neurologic: CN 2-12 grossly intact.  Psychiatric: Normal judgment and insight. Alert and oriented x 3. Normal mood.    Labs on Admission: I have personally reviewed following labs and imaging studies  CBC:  Recent Labs Lab 08/28/17 1513  WBC 5.4  HGB 13.9  HCT 37.3*  MCV 75.4*  PLT 160   Basic Metabolic  Panel:  Recent Labs Lab 08/28/17 1513  NA 117*  K 4.4  CL 89*  CO2 15*  GLUCOSE 292*  BUN 24*  CREATININE 1.68*  CALCIUM 8.9   GFR: Estimated Creatinine Clearance: 51 mL/min (A) (by C-G formula based on SCr of 1.68 mg/dL (H)). Liver Function Tests: No results for input(s): AST, ALT, ALKPHOS, BILITOT, PROT, ALBUMIN in the last 168 hours. No results for input(s): LIPASE, AMYLASE in the last 168 hours. No results for input(s): AMMONIA in the last 168 hours. Coagulation Profile: No results for input(s): INR, PROTIME in the last 168 hours. Cardiac Enzymes: No results for input(s): CKTOTAL, CKMB, CKMBINDEX, TROPONINI in the last 168 hours. BNP (last  3 results) No results for input(s): PROBNP in the last 8760 hours. HbA1C: No results for input(s): HGBA1C in the last 72 hours. CBG: No results for input(s): GLUCAP in the last 168 hours. Lipid Profile: No results for input(s): CHOL, HDL, LDLCALC, TRIG, CHOLHDL, LDLDIRECT in the last 72 hours. Thyroid Function Tests: No results for input(s): TSH, T4TOTAL, FREET4, T3FREE, THYROIDAB in the last 72 hours. Anemia Panel: No results for input(s): VITAMINB12, FOLATE, FERRITIN, TIBC, IRON, RETICCTPCT in the last 72 hours. Urine analysis:    Component Value Date/Time   COLORURINE YELLOW 08/28/2017 1645   APPEARANCEUR CLEAR 08/28/2017 1645   LABSPEC 1.007 08/28/2017 1645   PHURINE 5.0 08/28/2017 1645   GLUCOSEU >=500 (A) 08/28/2017 1645   HGBUR NEGATIVE 08/28/2017 1645   BILIRUBINUR NEGATIVE 08/28/2017 1645   KETONESUR NEGATIVE 08/28/2017 1645   PROTEINUR NEGATIVE 08/28/2017 1645   UROBILINOGEN 1.0 07/23/2015 1403   NITRITE NEGATIVE 08/28/2017 1645   LEUKOCYTESUR NEGATIVE 08/28/2017 1645    Radiological Exams on Admission: Dg Chest 2 View  Result Date: 08/28/2017 CLINICAL DATA:  Shortness of breath for 1 week. Chest pain on and off. EXAM: CHEST  2 VIEW COMPARISON:  12/04/2016 FINDINGS: Both lungs are clear. Negative for a  pneumothorax. Heart and mediastinum are within normal limits. Trachea is midline. Bone structures are unremarkable. IMPRESSION: No active cardiopulmonary disease. Electronically Signed   By: Richarda Overlie M.D.   On: 08/28/2017 15:50    EKG: Independently reviewed. Nsr, no st elevations or depressions or pathologic t waves or lbb  Assessment/Plan Active Problems:   Hypertension   Hyponatremia   Cocaine abuse (HCC)   Alcohol abuse   Hyperglycemia   Uncontrolled type 2 diabetes mellitus with diabetic nephropathy, with long-term current use of insulin (HCC)   Chest pain   CKD (chronic kidney disease) stage 3, GFR 30-59 ml/min (HCC)   COPD (chronic obstructive pulmonary disease) (HCC)   4f hx MI/cardiac stenting, hyponatremia, polysubstance abuse with recent alcohol and cocaine use, ckd, copd, presenting with hyponatremia and chest pain.  # Chest pain - moderate risk patient, history MI, has 2 stents, recent cocaine use, chest pain history appears anginal. Troponin mildly elevated 0.1, ekg unremarkable. Cardiology consulted, opts for non-intervention at this point. Currently chest pain free - trend troponins, contact cardiology if uptrending - continue heparin gtt for now - telemetry - AM ekg - consider nuclear stress in am  #COPD # Shortness of breath - does endorse hx of copd. Possible ischemia as above. No clinical signs of fluid overload. No signs of dvt. CXR unremarkable. Lungs clear, no hypoxia - f/u d dimer, bnp - continue home copd medication  # Hyponatremia - 117, corrects to 122. History of, so suspect chronic. Likely hypotonic hypovolemic, quite possibley beer potomania. Appears to be relatively asymptomatic. Is s/p 2 l ns in ED - fluid restrict - trend sodium - urine lytes, tsh  # alcohol abuse - unclear whether has been using heavily. Received librium in ed. Denies hx withdrawal - hold on benzodiazepines, sdu, start ciwa scoring  # CKD3 - stable - monitor   DVT  prophylaxis: heparin gtt Code Status: full code Family Communication: service center for homeless veterans and disabled veterans in [place]. Denies family/friends Disposition Plan: home Consults called:cardiology Admission status: sdu   Silvano Bilis MD [facility] Pager [phone]  If 7PM-7AM, please contact night-coverage [email or web address] Password [email or web address]  08/28/2017, 8:24 PM

## 2017-08-28 NOTE — ED Notes (Signed)
Critical low sodium level of 117.

## 2017-08-28 NOTE — ED Notes (Signed)
Report attempted 

## 2017-08-28 NOTE — Progress Notes (Signed)
ANTICOAGULATION CONSULT NOTE - Initial Consult  Pharmacy Consult for Heparin dosing Indication: chest pain/ACS  No Known Allergies  Patient Measurements: Height:  (180.3 cm) Weight: 172 lb (78 kg) IBW/kg (Calculated) : 75.3 Heparin Dosing Weight: 78  Vital Signs: Temp: 98.1 F (36.7 C) (10/05 1513) Temp Source: Oral (10/05 1513) BP: 145/85 (10/05 1730) Pulse Rate: 64 (10/05 1730)  Labs:  Recent Labs  08/28/17 1513  HGB 13.9  HCT 37.3*  PLT 160  CREATININE 1.68*    Estimated Creatinine Clearance: 51 mL/min (A) (by C-G formula based on SCr of 1.68 mg/dL (H)).   Medical History: Past Medical History:  Diagnosis Date  . Depression   . Diabetes mellitus without complication (HCC)   . Hypertension     Medications:  Scheduled:  . aspirin  324 mg Oral Once    Assessment: Jose [name] admitted with SOB, sharp chest pain and low Na. PMH of COPD. No anticoag PTA. No reported bleeding. HGB 13.2, HCT 37.1. Troponin 0.10.  Pharmacy consulted to dose heparin for ACS.   Goal of Therapy:  Heparin level 0.3-0.7 units/ml Monitor platelets by anticoagulation protocol: Yes   Plan:  Give heparin 4000 units bolus x 1 Start heparin infusion at 950 units/hr  6 hour heparin level, daily heparin level and CBC. Monitor S/Sx of bleeding.   Emeline General, PharmD Candidate 08/28/2017,5:54 PM

## 2017-08-29 ENCOUNTER — Inpatient Hospital Stay (HOSPITAL_COMMUNITY): Payer: Non-veteran care

## 2017-08-29 ENCOUNTER — Encounter (HOSPITAL_COMMUNITY): Payer: Self-pay | Admitting: Nurse Practitioner

## 2017-08-29 DIAGNOSIS — R072 Precordial pain: Secondary | ICD-10-CM

## 2017-08-29 DIAGNOSIS — R079 Chest pain, unspecified: Secondary | ICD-10-CM | POA: Diagnosis not present

## 2017-08-29 DIAGNOSIS — F1410 Cocaine abuse, uncomplicated: Secondary | ICD-10-CM | POA: Diagnosis not present

## 2017-08-29 DIAGNOSIS — F1010 Alcohol abuse, uncomplicated: Secondary | ICD-10-CM | POA: Diagnosis not present

## 2017-08-29 DIAGNOSIS — N183 Chronic kidney disease, stage 3 (moderate): Secondary | ICD-10-CM | POA: Diagnosis not present

## 2017-08-29 DIAGNOSIS — I129 Hypertensive chronic kidney disease with stage 1 through stage 4 chronic kidney disease, or unspecified chronic kidney disease: Secondary | ICD-10-CM | POA: Diagnosis not present

## 2017-08-29 DIAGNOSIS — I2510 Atherosclerotic heart disease of native coronary artery without angina pectoris: Secondary | ICD-10-CM | POA: Diagnosis not present

## 2017-08-29 LAB — NM MYOCAR MULTI W/SPECT W/WALL MOTION / EF
CHL CUP MPHR: 162 {beats}/min
CSEPEDS: 0 s
CSEPEW: 1 METS
CSEPPHR: 79 {beats}/min
Exercise duration (min): 0 min
Percent HR: 48 %
Rest HR: 49 {beats}/min

## 2017-08-29 LAB — OSMOLALITY, URINE: OSMOLALITY UR: 234 mosm/kg — AB (ref 300–900)

## 2017-08-29 LAB — BASIC METABOLIC PANEL
Anion gap: 8 (ref 5–15)
BUN: 21 mg/dL — AB (ref 6–20)
CHLORIDE: 100 mmol/L — AB (ref 101–111)
CO2: 19 mmol/L — ABNORMAL LOW (ref 22–32)
CREATININE: 1.6 mg/dL — AB (ref 0.61–1.24)
Calcium: 8.6 mg/dL — ABNORMAL LOW (ref 8.9–10.3)
GFR, EST AFRICAN AMERICAN: 53 mL/min — AB (ref >60)
GFR, EST NON AFRICAN AMERICAN: 46 mL/min — AB (ref >60)
Glucose, Bld: 103 mg/dL — ABNORMAL HIGH (ref 65–99)
Potassium: 4.1 mmol/L (ref 3.5–5.1)
SODIUM: 127 mmol/L — AB (ref 135–145)

## 2017-08-29 LAB — CBC
HEMATOCRIT: 35.5 % — AB (ref 39.0–52.0)
Hemoglobin: 13.1 g/dL (ref 13.0–17.0)
MCH: 27.8 pg (ref 26.0–34.0)
MCHC: 36.9 g/dL — AB (ref 30.0–36.0)
MCV: 75.2 fL — ABNORMAL LOW (ref 78.0–100.0)
PLATELETS: 143 10*3/uL — AB (ref 150–400)
RBC: 4.72 MIL/uL (ref 4.22–5.81)
RDW: 12.6 % (ref 11.5–15.5)
WBC: 4.8 10*3/uL (ref 4.0–10.5)

## 2017-08-29 LAB — GLUCOSE, CAPILLARY
GLUCOSE-CAPILLARY: 112 mg/dL — AB (ref 65–99)
Glucose-Capillary: 116 mg/dL — ABNORMAL HIGH (ref 65–99)
Glucose-Capillary: 262 mg/dL — ABNORMAL HIGH (ref 65–99)
Glucose-Capillary: 186 mg/dL — ABNORMAL HIGH (ref 65–99)

## 2017-08-29 LAB — PROTIME-INR
INR: 1.04
Prothrombin Time: 13.5 seconds (ref 11.4–15.2)

## 2017-08-29 LAB — ECHOCARDIOGRAM COMPLETE
HEIGHTINCHES: 71 in
Weight: 2726.4 oz

## 2017-08-29 LAB — HIV ANTIBODY (ROUTINE TESTING W REFLEX): HIV Screen 4th Generation wRfx: NONREACTIVE

## 2017-08-29 LAB — HEPARIN LEVEL (UNFRACTIONATED)
HEPARIN UNFRACTIONATED: 0.52 [IU]/mL (ref 0.30–0.70)
HEPARIN UNFRACTIONATED: 0.62 [IU]/mL (ref 0.30–0.70)

## 2017-08-29 LAB — SODIUM, URINE, RANDOM: SODIUM UR: 26 mmol/L

## 2017-08-29 LAB — MRSA PCR SCREENING: MRSA BY PCR: NEGATIVE

## 2017-08-29 LAB — TROPONIN I
TROPONIN I: 0.03 ng/mL — AB (ref <0.03)
Troponin I: 0.03 ng/mL (ref <0.03)

## 2017-08-29 MED ORDER — REGADENOSON 0.4 MG/5ML IV SOLN
0.4000 mg | Freq: Once | INTRAVENOUS | Status: AC
  Administered 2017-08-29: 0.4 mg via INTRAVENOUS
  Filled 2017-08-29: qty 5

## 2017-08-29 MED ORDER — TECHNETIUM TC 99M TETROFOSMIN IV KIT
30.0000 | PACK | Freq: Once | INTRAVENOUS | Status: AC | PRN
  Administered 2017-08-29: 30 via INTRAVENOUS

## 2017-08-29 MED ORDER — ATORVASTATIN CALCIUM 10 MG PO TABS
10.0000 mg | ORAL_TABLET | Freq: Every day | ORAL | Status: DC
  Administered 2017-08-29: 10 mg via ORAL
  Filled 2017-08-29: qty 1

## 2017-08-29 MED ORDER — TECHNETIUM TC 99M TETROFOSMIN IV KIT
10.0000 | PACK | Freq: Once | INTRAVENOUS | Status: AC
  Administered 2017-08-29: 10 via INTRAVENOUS

## 2017-08-29 MED ORDER — ASPIRIN 81 MG PO CHEW
81.0000 mg | CHEWABLE_TABLET | Freq: Every day | ORAL | Status: DC
  Administered 2017-08-29 – 2017-08-30 (×2): 81 mg via ORAL
  Filled 2017-08-29 (×2): qty 1

## 2017-08-29 MED ORDER — GUAIFENESIN-DM 100-10 MG/5ML PO SYRP
5.0000 mL | ORAL_SOLUTION | Freq: Four times a day (QID) | ORAL | Status: DC | PRN
  Administered 2017-08-29: 5 mL via ORAL
  Filled 2017-08-29: qty 5

## 2017-08-29 MED ORDER — REGADENOSON 0.4 MG/5ML IV SOLN
INTRAVENOUS | Status: AC
  Filled 2017-08-29: qty 5

## 2017-08-29 MED ORDER — ACETAMINOPHEN 325 MG PO TABS
650.0000 mg | ORAL_TABLET | Freq: Four times a day (QID) | ORAL | Status: DC | PRN
  Administered 2017-08-29: 650 mg via ORAL
  Filled 2017-08-29: qty 2

## 2017-08-29 NOTE — Progress Notes (Signed)
PROGRESS NOTE    Jose [name]  RUE:[id number] DOB: May 08, 1961 DOA: 08/28/2017 PCP: Margot Ables, MD  Brief Narrative:87f hx MI/cardiac stenting, hyponatremia, polysubstance abuse with recent alcohol and cocaine use, ckd, copd, presenting with hyponatremia and chest pain.  Assessment & Plan:   # atypical chest pain -No history of CAD withprior RCA and LAD stenting in 2012 -Reported negative stress test at the [place] in 2017 -Minimally elevated troponin with flat trend the background of ongoing cocaine use -However due to extensive cardiac risk factors and uncontrolled diabetes, cardiology consulted -plan for a Myoview this morning -I will stop IV heparin I don't think he needs it at this time  2. COPD -Stable, no wheezing, chest x-ray unremarkable -Nebs PRN  3. Chronic kidney disease stage III -Creatinine 1.6, ACE inhibitor held  4. Polysubstance abuse, cocaine, cannabis and EtOH -Counseled, continue thiamine  5. Hyponatremia -Likely secondary to beer potomania  6. Diabetes on insulin -Lantus held, nothing by mouth now, continue sliding scale insulin for now  DVT prophylaxis: heparin gtt Code Status: full code Family Communication: service center for homeless veterans and disabled veterans in [place]. Denies family/friends Disposition Plan: home  Consultants:   Cards   Procedures:   Antimicrobials:    Subjective: -feels ok, reports dyspnea with exertion  Objective: Vitals:   08/29/17 0506 08/29/17 0755 08/29/17 0855 08/29/17 1124  BP: 108/66 97/62 98/62  92/62  Pulse: (!) 58 (!) 56  (!) 51  Resp: 16 16    Temp: 97.7 F (36.5 C) 97.7 F (36.5 C) 97.8 F (36.6 C)   TempSrc: Oral Oral Oral   SpO2: 100% 99%    Weight: 77.3 kg (170 lb 6.4 oz)     Height:        Intake/Output Summary (Last 24 hours) at 08/29/17 1148 Last data filed at 08/29/17 0233  Gross per 24 hour  Intake          2094.93 ml  Output              600 ml  Net           1494.93 ml   Filed Weights   08/28/17 1514 08/28/17 2249 08/29/17 0506  Weight: 78 kg (172 lb) 78.2 kg (172 lb 6.4 oz) 77.3 kg (170 lb 6.4 oz)    Examination:  General exam: Appears calm and comfortable  Respiratory system: Clear to auscultation. Respiratory effort normal. Cardiovascular system: S1 & S2 heard, RRR. No JVD, murmurs, rubs, gallops Gastrointestinal system: Abdomen is nondistended, soft and nontender.Normal bowel sounds heard. Central nervous system: Alert and oriented. No focal neurological deficits. Extremities: Symmetric 5 x 5 power. Skin: No rashes, lesions or ulcers Psychiatry: Judgement and insight appear normal. Mood & affect appropriate.     Data Reviewed:   CBC:  Recent Labs Lab 08/28/17 1513 08/29/17 0239  WBC 5.4 4.8  HGB 13.9 13.1  HCT 37.3* 35.5*  MCV 75.4* 75.2*  PLT 160 143*   Basic Metabolic Panel:  Recent Labs Lab 08/28/17 1513 08/29/17 0712  NA 117* 127*  K 4.4 4.1  CL 89* 100*  CO2 15* 19*  GLUCOSE 292* 103*  BUN 24* 21*  CREATININE 1.68* 1.60*  CALCIUM 8.9 8.6*   GFR: Estimated Creatinine Clearance: 53.6 mL/min (A) (by C-G formula based on SCr of 1.6 mg/dL (H)). Liver Function Tests: No results for input(s): AST, ALT, ALKPHOS, BILITOT, PROT, ALBUMIN in the last 168 hours. No results for input(s): LIPASE, AMYLASE in the last 168 hours. No  results for input(s): AMMONIA in the last 168 hours. Coagulation Profile:  Recent Labs Lab 08/29/17 0239  INR 1.04   Cardiac Enzymes:  Recent Labs Lab 08/28/17 2027 08/29/17 0239 08/29/17 0712  TROPONINI 0.03* 0.03* 0.03*   BNP (last 3 results) No results for input(s): PROBNP in the last 8760 hours. HbA1C:  Recent Labs  08/28/17 2027  HGBA1C 9.7*   CBG:  Recent Labs Lab 08/28/17 2246 08/29/17 0753  GLUCAP 360* 116*   Lipid Profile: No results for input(s): CHOL, HDL, LDLCALC, TRIG, CHOLHDL, LDLDIRECT in the last 72 hours. Thyroid Function Tests: No results for  input(s): TSH, T4TOTAL, FREET4, T3FREE, THYROIDAB in the last 72 hours. Anemia Panel: No results for input(s): VITAMINB12, FOLATE, FERRITIN, TIBC, IRON, RETICCTPCT in the last 72 hours. Urine analysis:    Component Value Date/Time   COLORURINE YELLOW 08/28/2017 1645   APPEARANCEUR CLEAR 08/28/2017 1645   LABSPEC 1.007 08/28/2017 1645   PHURINE 5.0 08/28/2017 1645   GLUCOSEU >=500 (A) 08/28/2017 1645   HGBUR NEGATIVE 08/28/2017 1645   BILIRUBINUR NEGATIVE 08/28/2017 1645   KETONESUR NEGATIVE 08/28/2017 1645   PROTEINUR NEGATIVE 08/28/2017 1645   UROBILINOGEN 1.0 07/23/2015 1403   NITRITE NEGATIVE 08/28/2017 1645   LEUKOCYTESUR NEGATIVE 08/28/2017 1645   Sepsis Labs: (procalcitonin:4,lacticidven:4)  ) Recent Results (from the past 240 hour(s))  MRSA PCR Screening     Status: None   Collection Time: 08/28/17 10:12 PM  Result Value Ref Range Status   MRSA by PCR NEGATIVE NEGATIVE Final    Comment:        The GeneXpert MRSA Assay (FDA approved for NASAL specimens only), is one component of a comprehensive MRSA colonization surveillance program. It is not intended to diagnose MRSA infection nor to guide or monitor treatment for MRSA infections.          Radiology Studies: Dg Chest 2 View  Result Date: 08/28/2017 CLINICAL DATA:  Shortness of breath for 1 week. Chest pain on and off. EXAM: CHEST  2 VIEW COMPARISON:  12/04/2016 FINDINGS: Both lungs are clear. Negative for a pneumothorax. Heart and mediastinum are within normal limits. Trachea is midline. Bone structures are unremarkable. IMPRESSION: No active cardiopulmonary disease. Electronically Signed   By: Richarda Overlie M.D.   On: 08/28/2017 15:50        Scheduled Meds: . regadenoson      . aspirin  81 mg Oral Daily  . atorvastatin  10 mg Oral q1800  . brimonidine  1 drop Both Eyes BID  . doxazosin  4 mg Oral QHS  . folic acid  1 mg Oral Daily  . insulin aspart  0-15 Units Subcutaneous TID WC  .  insulin aspart  0-5 Units Subcutaneous QHS  . latanoprost  1 drop Both Eyes QHS  . mometasone-formoterol  2 puff Inhalation BID  . regadenoson  0.4 mg Intravenous Once  . thiamine  100 mg Oral Daily   Continuous Infusions: . sodium chloride    . sodium chloride 10 mL/hr at 08/28/17 2312  . dextrose 5 % and 0.45% NaCl    . heparin 1,000 Units/hr (08/29/17 0834)     LOS: 1 day    Time spent:    Zannie Cove, MD [facility] Page via [email or web address], password [email or web address] After 7PM please contact night-coverage  08/29/2017, 11:48 AM

## 2017-08-29 NOTE — Consult Note (Signed)
Cardiology Consult    Patient ID: Wylee Dorantes MRN: [id number], DOB/AGE: [birth year]   Admit date: 08/28/2017 Date of Consult: 08/29/2017  Primary Physician: Margot Ables, MD Primary Cardiologist: [name] Requesting Provider: Mitchel Honour, MD  Patient Profile    Chelsea Nusz is a 56 y.o. male with a history of CAD s/p prior stenting, HTN, HL, DM, tob, etoh, drug abuse, COPD, and noncompliance, who is being seen today for the evaluation of chest pain and trop elevation in the setting of cocaine use at the request of Dr. Jomarie Longs.  Past Medical History   Past Medical History:  Diagnosis Date  . Alcohol abuse   . CAD (coronary artery disease)    a. Reported MI in 2012 s/p 2 stents;  b. 08/2012 Cath: LM 20, LAD 20 diff ISR, jailed septal - 99%, LCX 30ost, RI large, min irregs, RCA 30p, 20-30 ISR-->Med Rx; c. 2017 [facility]  Marland Kitchen CKD (chronic kidney disease), stage III (HCC)   . Cocaine abuse (HCC)   . COPD (chronic obstructive pulmonary disease) (HCC)   . Depression   . Diabetes mellitus without complication (HCC)   . Hyperlipidemia   . Hypertension   . Noncompliance     Past Surgical History:  Procedure Laterality Date  . CARDIAC CATHETERIZATION    . CORONARY STENT PLACEMENT    . LEFT HEART CATHETERIZATION WITH CORONARY ANGIOGRAM Bilateral 08/25/2012   Procedure: LEFT HEART CATHETERIZATION WITH CORONARY ANGIOGRAM;  Surgeon: Kathleene Hazel, MD;  Location: [facility] CATH LAB;  Service: Cardiovascular;  Laterality: Bilateral;     Allergies  Allergies  Allergen Reactions  . Simvastatin Other (See Comments)    Reported by [facility] 04/10/16 - unknown reaction    History of Present Illness    56 y/o ? with the above complex PMH including CAD s/p MI and RCA/LAD stenting in 2012 with subsequent cath in 2013 revealing minimal ISR and otw nonobs CAD, HTN, HL, poorly controlled DM, CKD III, COPD, noncompliance, remote tob abuse, and  ongoing etoh/drug abuse.  He says that he was having some c/p in 2017 and was eval @ the [place] with stress testing and echo, which were reportedly nl.  Earlier this year he was eval through the [place] system for c/p and elevated D dimer.  CTA chest was neg for PE.  He says that following that, he did reasonably well, but about 2-3 wks ago, he began to have increasing DOE with cough, wheezing, and sharp chest pain.  This occurs throughout the day but seems to be worse when lying in bed @ night.  Chest pain symptoms last about 15 seconds and resolve once cough/wheezing resolves - usually after using an inhaler.  This past week, he was moving into a new apartment, and noted reduced exercise tolerance with exertional dyspnea/chest pain.  He was seen by his PCP @ the [facility] on 10/5 and reported symptoms.  He was rx cough medicine and labs were drawn in the office.  He later received a call telling him to present to the ED for evaluation.  Of note, pt last used cocaine on Wed 10/3.  Pt presented to the [facility] ED where poc Trop was elevated @ 0.10.  Subsequent trops returned @ 0.03 x 3.  He did have some chest pain last night but none this AM.  He is disappointed that he is NPO and waiting for a stress test.  Inpatient Medications    .  brimonidine  1 drop Both Eyes BID  . doxazosin  4 mg Oral QHS  . folic acid  1 mg Oral Daily  . insulin aspart  0-15 Units Subcutaneous TID WC  . insulin aspart  0-5 Units Subcutaneous QHS  . latanoprost  1 drop Both Eyes QHS  . mometasone-formoterol  2 puff Inhalation BID  . regadenoson  0.4 mg Intravenous Once  . thiamine  100 mg Oral Daily    Family History    Family History  Problem Relation Age of Onset  . Heart disease Mother        MI 69s    Social History    Social History   Social History  . Marital status: Married    Spouse name: N/A  . Number of children: N/A  . Years of education: N/A   Occupational History  . Not on file.   Social  History Main Topics  . Smoking status: Former Smoker    Types: Cigarettes    Quit date: 11/25/2007  . Smokeless tobacco: Never Used  . Alcohol use Yes     Comment: Used to drink heavily - says currently 2 beers/day.  . Drug use: Yes    Types: Cocaine, Marijuana     Comment: Relapsed into using both cocaine and marijuana - uses at least weekly.  Marland Kitchen Sexual activity: Yes    Birth control/ protection: None   Other Topics Concern  . Not on file   Social History Narrative   Lives in [place] by himself.  Does not work or routinely exercise.     Review of Systems    General:  No chills, fever, night sweats or weight changes.  Cardiovascular:  +++ chest pain, +++ dyspnea on exertion, no edema, orthopnea, palpitations, paroxysmal nocturnal dyspnea. Dermatological: No rash, lesions/masses Respiratory: +++ cough, +++ dyspnea w/ wheezing Urologic: No hematuria, dysuria Abdominal:   No nausea, vomiting, diarrhea, bright red blood per rectum, melena, or hematemesis Neurologic:  No visual changes, wkns, changes in mental status. All other systems reviewed and are otherwise negative except as noted above.  Physical Exam    Blood pressure 98/62, pulse (!) 56, temperature 97.8 F (36.6 C), temperature source Oral, resp. rate 16, height  (1.803 m), weight 170 lb 6.4 oz (77.3 kg), SpO2 99 %.  General: Pleasant, NAD Psych: Normal affect. Neuro: Alert and oriented X 3. Moves all extremities spontaneously. HEENT: Normal  Neck: Supple without bruits or JVD. Lungs:  Resp regular and unlabored, diminished breath sounds bilat. Heart: RRR, distant, no s3, s4, or murmurs. Abdomen: Soft, non-tender, non-distended, BS + x 4.  Extremities: No clubbing, cyanosis or edema. DP/PT/Radials 2+ and equal bilaterally.  Labs    Troponin [facility] Test)  Recent Labs  08/28/17 1730  TROPIPOC 0.10*    Recent Labs  08/28/17 2027 08/29/17 0239 08/29/17 0712  TROPONINI 0.03* 0.03* 0.03*   Lab  Results  Component Value Date   WBC 4.8 08/29/2017   HGB 13.1 08/29/2017   HCT 35.5 (L) 08/29/2017   MCV 75.2 (L) 08/29/2017   PLT 143 (L) 08/29/2017     Recent Labs Lab 08/29/17 0712  NA 127*  K 4.1  CL 100*  CO2 19*  BUN 21*  CREATININE 1.60*  CALCIUM 8.6*  GLUCOSE 103*   Lab Results  Component Value Date   CHOL 129 08/26/2012   HDL 37 (L) 08/26/2012   LDLCALC 72 08/26/2012   TRIG 98 08/26/2012   Lab Results  Component Value Date   DDIMER 0.40 08/28/2017     Radiology Studies    Dg Chest 2 View  Result Date: 08/28/2017 CLINICAL DATA:  Shortness of breath for 1 week. Chest pain on and off. EXAM: CHEST  2 VIEW COMPARISON:  12/04/2016 FINDINGS: Both lungs are clear. Negative for a pneumothorax. Heart and mediastinum are within normal limits. Trachea is midline. Bone structures are unremarkable. IMPRESSION: No active cardiopulmonary disease. Electronically Signed   By: Richarda Overlie M.D.   On: 08/28/2017 15:50    ECG & Cardiac Imaging    Sinus brady, 55, first degree avb, septal infarct  Assessment & Plan    1.  Chest pain/elevated troponin/CAD: Pt with a h/o CAD s/p prior RCA and LAD stenting in 2012. Last cath in 2013 with minimal ISR and otw nonobs dzs.  [facility].  He has been having intermittent rest and exertional chest pain over the past 2 wks, generally associated with cough, wheezing, and dyspnea, described as sharp in nature, and lasting about 15 seconds prior to resolving with inhaler therapy.  He has also been having some reduction in exercise tolerance.  He saw PCP yesterday and had labs drawn (? Trop) and was advised to present to ED when lab returned abnl.  In ED, POC trop elev @ 0.10.  F/u Trop 0.03 x 3.  Chest pain free this am.  He has been using cocaine again and admits to last using on 10/3 (UDS +).  Will arrange for a lexiscan MV to r/o ischemia.  Resume ASA.  Looks like he's not on a statin 2/2 prior intol to simva.  Will try  atorva for now - can consider alternate or [facility] in [facility].  No  blocker in setting of active cocaine use, bradycardia, and relative hypotension.  If Myoview neg, likely d/c later today.  2.  Essential HTN:  BP stable to low on home dose of lisinopril.  3.  HL:  Intolerant to simvastatin.  Will add atorva for now.  4.  DM II: per IM.  Gluc 103 this am.  5.  CKD III: Creat 1.6.  ACEi on hold.  Will need hydration if nuc study high risk and cath necessary.  6.  Drug/ETOH abuse: Complete cessation advised.  Smoking marijuana and using cocaine at least once a week.  Drinks two beers/day.  7.  Hyponatremia:  In setting of ETOH.  Per IM.  Signed, Nicolasa Ducking, NP 08/29/2017, 9:36 AM  For questions or updates, please contact   Please consult [email or web address] for contact info under Cardiology/STEMI.  Patient examined chart reviewed Atypical pain ETOH abuse. R/O only istat troponin postive with no acute ECG changes Exam with black male asking for food Clear lungs no murmur abdomen soft no edema. Agree with risk stratification with nuclear study this am Scheduled Keep patient NPO  Charlton Haws

## 2017-08-29 NOTE — Progress Notes (Signed)
ANTICOAGULATION CONSULT NOTE - F/u Consult  Pharmacy Consult for Heparin dosing Indication: chest pain/ACS  Allergies  Allergen Reactions  . Simvastatin Other (See Comments)    Reported by [facility] 04/10/16 - unknown reaction    Patient Measurements: Height:  (180.3 cm) Weight: 170 lb 6.4 oz (77.3 kg) IBW/kg (Calculated) : 75.3 Heparin Dosing Weight: 78  Vital Signs: Temp: 97.7 F (36.5 C) (10/06 0755) Temp Source: Oral (10/06 0755) BP: 97/62 (10/06 0755) Pulse Rate: 56 (10/06 0755)  Labs:  Recent Labs  08/28/17 1513 08/28/17 2027 08/29/17 0054 08/29/17 0239 08/29/17 0712  HGB 13.9  --   --  13.1  --   HCT 37.3*  --   --  35.5*  --   PLT 160  --   --  143*  --   LABPROT  --   --   --  13.5  --   INR  --   --   --  1.04  --   HEPARINUNFRC  --   --  0.62  --  0.52  CREATININE 1.68*  --   --   --   --   TROPONINI  --  0.03*  --  0.03*  --     Estimated Creatinine Clearance: 51 mL/min (A) (by C-G formula based on SCr of 1.68 mg/dL (H)).   Medical History: Past Medical History:  Diagnosis Date  . Alcohol abuse   . Cocaine abuse (HCC)   . Depression   . Diabetes mellitus without complication (HCC)   . Hypertension   . Myocardial infarction [facility])    2 stents ~2012    Medications:  Scheduled:  . brimonidine  1 drop Both Eyes BID  . doxazosin  4 mg Oral QHS  . folic acid  1 mg Oral Daily  . insulin aspart  0-15 Units Subcutaneous TID WC  . insulin aspart  0-5 Units Subcutaneous QHS  . latanoprost  1 drop Both Eyes QHS  . mometasone-formoterol  2 puff Inhalation BID  . thiamine  100 mg Oral Daily   Assessment: 56 yo male admitted with SOB and chest pain. Pharmacy consulted to dose heparin for ACS.  CBC low but stable with no bleeding observed.  Heparin level therapeutic at 0.52 with slight downtrend   Goal of Therapy:  Heparin level 0.3-0.7 units/ml Monitor platelets by anticoagulation protocol: Yes   Plan:  Increase heparin  gtt to 1000 units/hr F/u AM heparin level with slight increase in dose Daily heparin level and CBC Monitor for s/s bleeding F/u cardiology plan   Daylene Posey, PharmD Pharmacy Resident Pager #: [phone] 08/29/2017 8:30 AM

## 2017-08-29 NOTE — Progress Notes (Signed)
  Echocardiogram 2D Echocardiogram has been performed.  Roosvelt Maser F 08/29/2017, 8:49 AM

## 2017-08-29 NOTE — Progress Notes (Signed)
ANTICOAGULATION CONSULT NOTE - F/u Consult  Pharmacy Consult for Heparin dosing Indication: chest pain/ACS  Allergies  Allergen Reactions  . Simvastatin Other (See Comments)    Reported by [facility] 04/10/16 - unknown reaction    Patient Measurements: Height:  (180.3 cm) Weight: 172 lb 6.4 oz (78.2 kg) IBW/kg (Calculated) : 75.3 Heparin Dosing Weight: 78  Vital Signs: Temp: 98.3 F (36.8 C) (10/06 0044) Temp Source: Oral (10/06 0044) BP: 118/76 (10/06 0044) Pulse Rate: 60 (10/06 0044)  Labs:  Recent Labs  08/28/17 1513 08/28/17 2027 08/29/17 0054  HGB 13.9  --   --   HCT 37.3*  --   --   PLT 160  --   --   HEPARINUNFRC  --   --  0.62  CREATININE 1.68*  --   --   TROPONINI  --  0.03*  --     Estimated Creatinine Clearance: 51 mL/min (A) (by C-G formula based on SCr of 1.68 mg/dL (H)).   Medical History: Past Medical History:  Diagnosis Date  . Alcohol abuse   . Cocaine abuse (HCC)   . Depression   . Diabetes mellitus without complication (HCC)   . Hypertension   . Myocardial infarction [facility])    2 stents ~2012    Medications:  Scheduled:  . brimonidine  1 drop Both Eyes BID  . doxazosin  4 mg Oral QHS  . folic acid  1 mg Oral Daily  . insulin aspart  0-15 Units Subcutaneous TID WC  . insulin aspart  0-5 Units Subcutaneous QHS  . latanoprost  1 drop Both Eyes QHS  . mometasone-formoterol  2 puff Inhalation BID  . thiamine  100 mg Oral Daily   Assessment: 56 yo male admitted with SOB and chest pain. Pharmacy consulted to dose heparin for ACS.   Heparin level therapeutic at 0.62. No bleeding documented.   Goal of Therapy:  Heparin level 0.3-0.7 units/ml Monitor platelets by anticoagulation protocol: Yes   Plan:  Continue heparin gtt at 950 units/hr  Confirm heparin level in 6 hrs Daily heparin level and CBC Monitor for s/s bleeding F/u cardiology plan   York Cerise, PharmD Clinical Pharmacist 08/29/17 2:06 AM

## 2017-08-30 DIAGNOSIS — F1010 Alcohol abuse, uncomplicated: Secondary | ICD-10-CM | POA: Diagnosis not present

## 2017-08-30 DIAGNOSIS — F1410 Cocaine abuse, uncomplicated: Secondary | ICD-10-CM | POA: Diagnosis not present

## 2017-08-30 DIAGNOSIS — R079 Chest pain, unspecified: Secondary | ICD-10-CM | POA: Diagnosis present

## 2017-08-30 DIAGNOSIS — J42 Unspecified chronic bronchitis: Secondary | ICD-10-CM

## 2017-08-30 DIAGNOSIS — N183 Chronic kidney disease, stage 3 (moderate): Secondary | ICD-10-CM | POA: Diagnosis not present

## 2017-08-30 LAB — CBC
HCT: 37.1 % — ABNORMAL LOW (ref 39.0–52.0)
HEMOGLOBIN: 13.5 g/dL (ref 13.0–17.0)
MCH: 27.7 pg (ref 26.0–34.0)
MCHC: 36.4 g/dL — AB (ref 30.0–36.0)
MCV: 76.2 fL — ABNORMAL LOW (ref 78.0–100.0)
PLATELETS: 154 10*3/uL (ref 150–400)
RBC: 4.87 MIL/uL (ref 4.22–5.81)
RDW: 13.1 % (ref 11.5–15.5)
WBC: 5.6 10*3/uL (ref 4.0–10.5)

## 2017-08-30 LAB — GLUCOSE, CAPILLARY
GLUCOSE-CAPILLARY: 263 mg/dL — AB (ref 65–99)
Glucose-Capillary: 181 mg/dL — ABNORMAL HIGH (ref 65–99)

## 2017-08-30 MED ORDER — AMLODIPINE BESYLATE 2.5 MG PO TABS
2.5000 mg | ORAL_TABLET | Freq: Once | ORAL | Status: AC
  Administered 2017-08-30: 2.5 mg via ORAL
  Filled 2017-08-30: qty 1

## 2017-08-30 MED ORDER — INSULIN GLARGINE 100 UNIT/ML ~~LOC~~ SOLN
20.0000 [IU] | Freq: Every day | SUBCUTANEOUS | Status: DC
  Administered 2017-08-30: 20 [IU] via SUBCUTANEOUS
  Filled 2017-08-30: qty 0.2

## 2017-08-30 MED ORDER — LISINOPRIL 20 MG PO TABS
20.0000 mg | ORAL_TABLET | Freq: Every day | ORAL | Status: DC
Start: 2017-08-31 — End: 2017-08-30

## 2017-08-30 MED ORDER — LISINOPRIL 10 MG PO TABS: 20.0000 mg | ORAL_TABLET | Freq: Every day | ORAL | Status: DC

## 2017-08-30 MED ORDER — ATORVASTATIN CALCIUM 40 MG PO TABS: 40.0000 mg | ORAL_TABLET | Freq: Every day | ORAL | 0 refills | Status: DC

## 2017-08-30 MED ORDER — INSULIN GLARGINE 100 UNIT/ML ~~LOC~~ SOLN: 30.0000 [IU] | Freq: Every day | SUBCUTANEOUS | Status: DC

## 2017-08-30 MED ORDER — FERROUS SULFATE 325 (65 FE) MG PO TABS
325.0000 mg | ORAL_TABLET | Freq: Every day | ORAL | Status: DC
  Administered 2017-08-30: 325 mg via ORAL
  Filled 2017-08-30: qty 1

## 2017-08-30 MED ORDER — ATORVASTATIN CALCIUM 40 MG PO TABS: 40.0000 mg | ORAL_TABLET | Freq: Every day | ORAL | Status: DC

## 2017-08-30 MED ORDER — VITAMIN B-12 100 MCG PO TABS
100.0000 ug | ORAL_TABLET | Freq: Every day | ORAL | Status: DC
  Administered 2017-08-30: 100 ug via ORAL
  Filled 2017-08-30: qty 1

## 2017-08-30 NOTE — Discharge Summary (Signed)
Physician Discharge Summary  Jose [name] ZOX:[id number] DOB: 1961-07-10 DOA: 08/28/2017  PCP: Margot Ables, MD  Admit date: 08/28/2017 Discharge date: 08/30/2017  Time spent: 35 minutes  Recommendations for Outpatient Follow-up:  1. Cardiology at [facility] in 2weeks 2. Re-challenging with statins, please evaluate for side effects, monitor LFts in 3-4weeks 3. Needs to be started on beta blocker once consistently off cocaine  Discharge Diagnoses:    Chest pain concerning for ACS   CAD status post RCA and LAD stenting   Hypertension   Hyponatremia   Cocaine abuse (HCC)   Alcohol abuse   Hyperglycemia   Uncontrolled type 2 diabetes mellitus with diabetic nephropathy, with long-term current use of insulin (HCC)   Chest pain with high risk for cardiac etiology   CKD (chronic kidney disease) stage 3, GFR 30-59 ml/min (HCC)   COPD (chronic obstructive pulmonary disease) (HCC)   Discharge Condition: stable  Diet recommendation: heart healthy, diabetic  Filed Weights   08/28/17 1514 08/28/17 2249 08/29/17 0506  Weight: 78 kg (172 lb) 78.2 kg (172 lb 6.4 oz) 77.3 kg (170 lb 6.4 oz)    History of present illness:  50f hx MI/cardiac stenting, hyponatremia, polysubstance abuse with recent alcohol and cocaine use, ckd, copd, presenting with hyponatremia and chest pain.  Hospital Course:   # Atypical chest pain/minimally elevated troponin -history of CAD with prior RCA and LAD stenting in 2012 -Reported negative stress test at the [place] in 2017outI -Minimally elevated troponin with flat trend the background of ongoing cocaine use -However due to extensive cardiac risk factors and uncontrolled diabetes, cardiology consulted, and underwent a Myoview yesterday afternoon which reported mild apical anterior ischemia however on cardiologist review it was felt to have no ischemia and given his noncompliance/poor renal function, Cath was felt to be more risky than beneficial and hence it was  decided to treat him with medical management. He needs to be started on a beta blocker once he can consistently abstain from cocaine -Patient will follow-up with his cardiologist at the [facility]  2. COPD -Stable, no wheezing, chest x-ray unremarkable -Nebs PRN  3. Chronic kidney disease stage III -Creatinine 1.6, ACE inhibitor held, can be resumed tomorrow -stable now  4. Polysubstance abuse, cocaine, cannabis and EtOH -Counseled, continue thiamine  5. Hyponatremia -Likely secondary to beer potomania  6. Diabetes on insulin -Lantus held, nothing by mouth now, continue sliding scale insulin for now  Procedures:  Lexi scan Myoview  Consultations:  Cardiology, [facility]  Discharge Exam: Vitals:   08/30/17 0857 08/30/17 1230  BP: 126/86 137/87  Pulse:    Resp:    Temp: (!) 97.4 F (36.3 C)   SpO2: 97%     General: AAOx3 Cardiovascular: S1S2/RRR Respiratory: CTAB  Discharge Instructions   Discharge Instructions    Diet - low sodium heart healthy    Complete by:  As directed    Diet Carb Modified    Complete by:  As directed    Increase activity slowly    Complete by:  As directed      Current Discharge Medication List    START taking these medications   Details  atorvastatin (LIPITOR) 40 MG tablet Take 1 tablet (40 mg total) by mouth daily at 6 PM. Qty: 30 tablet, Refills: 0      CONTINUE these medications which have CHANGED   Details  insulin glargine (LANTUS) 100 UNIT/ML injection Inject 0.3 mLs (30 Units total) into the skin daily before breakfast.  lisinopril (PRINIVIL,ZESTRIL) 10 MG tablet Take 2 tablets (20 mg total) by mouth daily. Restart tomorrow      CONTINUE these medications which have NOT CHANGED   Details  acetaminophen (TYLENOL) 325 MG tablet Take 325 mg by mouth every 6 (six) hours as needed for headache (pain).    albuterol (PROVENTIL HFA;VENTOLIN HFA) 108 (90 BASE) MCG/ACT inhaler Inhale 2 puffs into the lungs every 6  (six) hours as needed for wheezing or shortness of breath.     aspirin EC 81 MG tablet Take 81 mg by mouth daily.    brimonidine (ALPHAGAN) 0.2 % ophthalmic solution Place 1 drop into both eyes 2 (two) times daily.     budesonide-formoterol (SYMBICORT) 160-4.5 MCG/ACT inhaler Inhale 2 puffs into the lungs 2 (two) times daily.    Cyanocobalamin (VITAMIN B-12 PO) Take 1 tablet by mouth daily.     ferrous sulfate 325 (65 FE) MG tablet Take 325 mg by mouth daily with breakfast.    glucose 4 GM chewable tablet Chew 4 g by mouth daily as needed for low blood sugar.     latanoprost (XALATAN) 0.005 % ophthalmic solution Place 1 drop into both eyes at bedtime.     Omega-3 Fatty Acids (FISH OIL) 1000 MG CAPS Take 1,000 mg by mouth 2 (two) times daily.     traZODone (DESYREL) 150 MG tablet Take 150 mg by mouth at bedtime.    doxazosin (CARDURA) 8 MG tablet Take 4 mg by mouth at bedtime.    folic acid (FOLVITE) 1 MG tablet Take 1 tablet (1 mg total) by mouth daily. Qty: 30 tablet, Refills: 0    insulin aspart (NOVOLOG) 100 UNIT/ML injection Inject 0-9 Units into the skin 3 (three) times daily with meals. Qty: 10 mL, Refills: 2      STOP taking these medications     naproxen (NAPROSYN) 500 MG tablet      thiamine 100 MG tablet        Allergies  Allergen Reactions  . Simvastatin Other (See Comments)    Reported by [facility] 04/10/16 - unknown reaction   Follow-up Information    Cardiology at [facility]. Schedule an appointment as soon as possible for a visit in 2 week(s).            The results of significant diagnostics from this hospitalization (including imaging, microbiology, ancillary and laboratory) are listed below for reference.    Significant Diagnostic Studies: Dg Chest 2 View  Result Date: 08/28/2017 CLINICAL DATA:  Shortness of breath for 1 week. Chest pain on and off. EXAM: CHEST  2 VIEW COMPARISON:  12/04/2016 FINDINGS: Both lungs are clear. Negative for  a pneumothorax. Heart and mediastinum are within normal limits. Trachea is midline. Bone structures are unremarkable. IMPRESSION: No active cardiopulmonary disease. Electronically Signed   By: Richarda Overlie M.D.   On: 08/28/2017 15:50   Nm Myocar Multi W/spect W/wall Motion / Ef  Result Date: 08/29/2017 CLINICAL DATA:  56 year old male with chest pain EXAM: MYOCARDIAL IMAGING WITH SPECT (REST AND PHARMACOLOGIC-STRESS) GATED LEFT VENTRICULAR WALL MOTION STUDY LEFT VENTRICULAR EJECTION FRACTION TECHNIQUE: Standard myocardial SPECT imaging was performed after resting intravenous injection of 10 mCi Tc-65m tetrofosmin. Subsequently, intravenous infusion of Lexiscan was performed under the supervision of the Cardiology staff. At peak effect of the drug, 30 mCi Tc-23m tetrofosmin was injected intravenously and standard myocardial SPECT imaging was performed. Quantitative gated imaging was also performed to evaluate left ventricular wall motion, and estimate left  ventricular ejection fraction. COMPARISON:  Chest x-ray 08/28/2017 FINDINGS: Perfusion: Relatively decreased activity throughout the sub endocardium of the left ventricle following stress resulting in transient ischemic dilatation of the ventricular cavity. There may also be a small focus of reversible ischemia at the anteroapex. Wall Motion: Global hypokinesis. Left Ventricular Ejection Fraction: 34 % End diastolic volume 107 ml End systolic volume 70 ml IMPRESSION: 1. Positive for transient ischemic dilatation which suggests diffuse sub endocardial reversible ischemia. Additionally, there may be a small focus of reversible ischemia at the apical anterior wall. 2. Global hypokinesis. 3. Left ventricular ejection fraction 34% 4. Non invasive risk stratification*: High *2012 Appropriate Use Criteria for Coronary Revascularization Focused Update: J Am Coll Cardiol. 2012;59(9):857-881. http://content.dementiazones.com.aspx?articleid=1201161 Electronically  Signed   By: Malachy Moan M.D.   On: 08/29/2017 14:10    Microbiology: Recent Results (from the past 240 hour(s))  MRSA PCR Screening     Status: None   Collection Time: 08/28/17 10:12 PM  Result Value Ref Range Status   MRSA by PCR NEGATIVE NEGATIVE Final    Comment:        The GeneXpert MRSA Assay (FDA approved for NASAL specimens only), is one component of a comprehensive MRSA colonization surveillance program. It is not intended to diagnose MRSA infection nor to guide or monitor treatment for MRSA infections.      Labs: Basic Metabolic Panel:  Recent Labs Lab 08/28/17 1513 08/29/17 0712  NA 117* 127*  K 4.4 4.1  CL 89* 100*  CO2 15* 19*  GLUCOSE 292* 103*  BUN 24* 21*  CREATININE 1.68* 1.60*  CALCIUM 8.9 8.6*   Liver Function Tests: No results for input(s): AST, ALT, ALKPHOS, BILITOT, PROT, ALBUMIN in the last 168 hours. No results for input(s): LIPASE, AMYLASE in the last 168 hours. No results for input(s): AMMONIA in the last 168 hours. CBC:  Recent Labs Lab 08/28/17 1513 08/29/17 0239 08/30/17 0414  WBC 5.4 4.8 5.6  HGB 13.9 13.1 13.5  HCT 37.3* 35.5* 37.1*  MCV 75.4* 75.2* 76.2*  PLT 160 143* 154   Cardiac Enzymes:  Recent Labs Lab 08/28/17 2027 08/29/17 0239 08/29/17 0712  TROPONINI 0.03* 0.03* 0.03*   BNP: BNP (last 3 results)  Recent Labs  08/28/17 2027  BNP 22.0    ProBNP (last 3 results) No results for input(s): PROBNP in the last 8760 hours.  CBG:  Recent Labs Lab 08/29/17 1328 08/29/17 1652 08/29/17 2248 08/30/17 0802 08/30/17 1136  GLUCAP 112* 262* 186* 263* 181*       SignedZannie Cove MD.  [facility] 08/30/2017, 1:43 PM

## 2017-08-30 NOTE — Progress Notes (Signed)
Progress Note  Patient Name: Jose [name] Date of Encounter: 08/30/2017  Primary Cardiologist: [facility]  Subjective   Frustrated about the ordering of his medications.  Denies chest pain or shortness of breath.   Inpatient Medications    Scheduled Meds: . aspirin  81 mg Oral Daily  . atorvastatin  10 mg Oral q1800  . brimonidine  1 drop Both Eyes BID  . doxazosin  4 mg Oral QHS  . ferrous sulfate  325 mg Oral Q breakfast  . folic acid  1 mg Oral Daily  . insulin aspart  0-15 Units Subcutaneous TID WC  . insulin aspart  0-5 Units Subcutaneous QHS  . insulin glargine  20 Units Subcutaneous Daily  . latanoprost  1 drop Both Eyes QHS  . mometasone-formoterol  2 puff Inhalation BID  . thiamine  100 mg Oral Daily  . vitamin B-12  100 mcg Oral Daily   Continuous Infusions: . sodium chloride    . sodium chloride 10 mL/hr at 08/28/17 2312   PRN Meds: sodium chloride, acetaminophen, albuterol, dextrose, guaiFENesin-dextromethorphan   Vital Signs    Vitals:   08/30/17 0000 08/30/17 0500 08/30/17 0829 08/30/17 0857  BP: 120/70 131/83  126/86  Pulse:   69   Resp:   18   Temp: 97.8 F (36.6 C) 97.7 F (36.5 C)  (!) 97.4 F (36.3 C)  TempSrc: Oral Oral  Oral  SpO2: 98% 97% 99% 97%  Weight:      Height:        Intake/Output Summary (Last 24 hours) at 08/30/17 1120 Last data filed at 08/30/17 0500  Gross per 24 hour  Intake                0 ml  Output             1425 ml  Net            -1425 ml   Filed Weights   08/28/17 1514 08/28/17 2249 08/29/17 0506  Weight: 78 kg (172 lb) 78.2 kg (172 lb 6.4 oz) 77.3 kg (170 lb 6.4 oz)    Telemetry    Sinus rhythm.  - Personally Reviewed  ECG    N/a - Personally Reviewed  Physical Exam   GEN: Well-appearing.  No acute distress.   Neck: No JVD Cardiac: RRR, no murmurs, rubs, or gallops.  Respiratory: Clear to auscultation bilaterally. GI: Soft, nontender, non-distended  MS: No edema; No deformity. Neuro:   Nonfocal  Psych: Normal affect   Labs    Chemistry Recent Labs Lab 08/28/17 1513 08/29/17 0712  NA 117* 127*  K 4.4 4.1  CL 89* 100*  CO2 15* 19*  GLUCOSE 292* 103*  BUN 24* 21*  CREATININE 1.68* 1.60*  CALCIUM 8.9 8.6*  GFRNONAA 43* 46*  GFRAA 50* 53*  ANIONGAP 13 8     Hematology Recent Labs Lab 08/28/17 1513 08/29/17 0239 08/30/17 0414  WBC 5.4 4.8 5.6  RBC 4.95 4.72 4.87  HGB 13.9 13.1 13.5  HCT 37.3* 35.5* 37.1*  MCV 75.4* 75.2* 76.2*  MCH 28.1 27.8 27.7  MCHC 37.3* 36.9* 36.4*  RDW 12.7 12.6 13.1  PLT 160 143* 154    Cardiac Enzymes Recent Labs Lab 08/28/17 2027 08/29/17 0239 08/29/17 0712  TROPONINI 0.03* 0.03* 0.03*    Recent Labs Lab 08/28/17 1730  TROPIPOC 0.10*     BNP Recent Labs Lab 08/28/17 2027  BNP 22.0     DDimer  Recent  Labs Lab 08/28/17 2027  DDIMER 0.40     Radiology    Dg Chest 2 View  Result Date: 08/28/2017 CLINICAL DATA:  Shortness of breath for 1 week. Chest pain on and off. EXAM: CHEST  2 VIEW COMPARISON:  12/04/2016 FINDINGS: Both lungs are clear. Negative for a pneumothorax. Heart and mediastinum are within normal limits. Trachea is midline. Bone structures are unremarkable. IMPRESSION: No active cardiopulmonary disease. Electronically Signed   By: Richarda Overlie M.D.   On: 08/28/2017 15:50   Nm Myocar Multi W/spect W/wall Motion / Ef  Result Date: 08/29/2017 CLINICAL DATA:  56 year old male with chest pain EXAM: MYOCARDIAL IMAGING WITH SPECT (REST AND PHARMACOLOGIC-STRESS) GATED LEFT VENTRICULAR WALL MOTION STUDY LEFT VENTRICULAR EJECTION FRACTION TECHNIQUE: Standard myocardial SPECT imaging was performed after resting intravenous injection of 10 mCi Tc-90m tetrofosmin. Subsequently, intravenous infusion of Lexiscan was performed under the supervision of the Cardiology staff. At peak effect of the drug, 30 mCi Tc-8m tetrofosmin was injected intravenously and standard myocardial SPECT imaging was performed.  Quantitative gated imaging was also performed to evaluate left ventricular wall motion, and estimate left ventricular ejection fraction. COMPARISON:  Chest x-ray 08/28/2017 FINDINGS: Perfusion: Relatively decreased activity throughout the sub endocardium of the left ventricle following stress resulting in transient ischemic dilatation of the ventricular cavity. There may also be a small focus of reversible ischemia at the anteroapex. Wall Motion: Global hypokinesis. Left Ventricular Ejection Fraction: 34 % End diastolic volume 107 ml End systolic volume 70 ml IMPRESSION: 1. Positive for transient ischemic dilatation which suggests diffuse sub endocardial reversible ischemia. Additionally, there may be a small focus of reversible ischemia at the apical anterior wall. 2. Global hypokinesis. 3. Left ventricular ejection fraction 34% 4. Non invasive risk stratification*: High *2012 Appropriate Use Criteria for Coronary Revascularization Focused Update: J Am Coll Cardiol. 2012;59(9):857-881. http://[email or web address]?[email or web address] Electronically Signed   By: Malachy Moan M.D.   On: 08/29/2017 14:10    Cardiac Studies   Echo 08/30/17:  Study Conclusions  - Left ventricle: The cavity size was normal. There was moderate   concentric hypertrophy. Systolic function was normal. The   estimated ejection fraction was in the range of 55% to 60%. Wall   motion was normal; there were no regional wall motion   abnormalities. Doppler parameters are consistent with abnormal   left ventricular relaxation (grade 1 diastolic dysfunction).   Doppler parameters are consistent with indeterminate ventricular   filling pressure. - Aortic valve: Transvalvular velocity was within the normal range.   There was no stenosis. There was no regurgitation. - Mitral valve: Transvalvular velocity was within the normal range.   There was no evidence for stenosis. There was trivial   regurgitation. - Left  atrium: The atrium was mildly dilated. - Right ventricle: The cavity size was normal. Wall thickness was   normal. Systolic function was normal. - Right atrium: The atrium was mildly dilated. - Atrial septum: No defect or patent foramen ovale was identified. - Tricuspid valve: There was trivial regurgitation. - Pericardium, extracardiac: A trivial pericardial effusion was   identified.  Patient Profile     56 y.o. male with CAD s/p RCA PCI, hypertension, hyperlipidemia, diabetes, polysubstance abuse, COPD and noncompliance here with NSTEMI in the setting of cocaine use.    Assessment & Plan    # Elevated troponin: # CAD s/p PCI: Troponin mildly elevated to 0.03 and flat.  Lexiscan Myoview with report of mild apical anterior ischemia and TID (though less specific with [employer]  than exercise).  On my review this reveals no ischemia.  Given his non-compliance and poor renal function, cath presents more risk than benefit.  Plan for medical management.  Beta blocker once he can consistently abstain from cocaine.    # Hypertension: Home lisinopril has been held 2/2 low BP and acute on chronic renal failure.  Recent baseline unknown.  Likely restart on 10/8.  Will give amlodipine 2.5mg  today.   # Hyperlipidemia: He only takes atorvastatin .  We will check lipids and increase to .  He may need PCSK9 inhibitor if he cannot tolerate higher doses.    For questions or updates, please contact [facility] Please consult [email or web address] for contact info under Cardiology/STEMI.      Signed, Chilton Si, MD  08/30/2017, 11:20 AM

## 2018-05-20 DIAGNOSIS — R6889 Other general symptoms and signs: Secondary | ICD-10-CM | POA: Diagnosis not present

## 2018-06-25 DIAGNOSIS — R69 Illness, unspecified: Secondary | ICD-10-CM | POA: Diagnosis not present

## 2018-06-25 DIAGNOSIS — R6889 Other general symptoms and signs: Secondary | ICD-10-CM | POA: Diagnosis not present

## 2018-07-09 DIAGNOSIS — R6889 Other general symptoms and signs: Secondary | ICD-10-CM | POA: Diagnosis not present

## 2018-09-01 DIAGNOSIS — R6889 Other general symptoms and signs: Secondary | ICD-10-CM | POA: Diagnosis not present

## 2019-02-01 DIAGNOSIS — R6889 Other general symptoms and signs: Secondary | ICD-10-CM | POA: Diagnosis not present

## 2019-02-01 DIAGNOSIS — H35033 Hypertensive retinopathy, bilateral: Secondary | ICD-10-CM | POA: Insufficient documentation

## 2019-03-29 DIAGNOSIS — R6889 Other general symptoms and signs: Secondary | ICD-10-CM | POA: Diagnosis not present

## 2019-04-27 ENCOUNTER — Emergency Department (HOSPITAL_COMMUNITY): Payer: Medicare HMO

## 2019-04-27 ENCOUNTER — Inpatient Hospital Stay (HOSPITAL_COMMUNITY)
Admission: EM | Admit: 2019-04-27 | Discharge: 2019-04-30 | DRG: 683 | Disposition: A | Discharge status: Home / Self Care | Payer: Medicare HMO | Attending: Family Medicine | Admitting: Family Medicine

## 2019-04-27 ENCOUNTER — Inpatient Hospital Stay (HOSPITAL_COMMUNITY): Payer: Medicare HMO

## 2019-04-27 ENCOUNTER — Encounter (HOSPITAL_COMMUNITY): Payer: Self-pay

## 2019-04-27 ENCOUNTER — Other Ambulatory Visit: Payer: Self-pay

## 2019-04-27 DIAGNOSIS — Z87891 Personal history of nicotine dependence: Secondary | ICD-10-CM

## 2019-04-27 DIAGNOSIS — I248 Other forms of acute ischemic heart disease: Secondary | ICD-10-CM | POA: Diagnosis not present

## 2019-04-27 DIAGNOSIS — E871 Hypo-osmolality and hyponatremia: Secondary | ICD-10-CM | POA: Diagnosis present

## 2019-04-27 DIAGNOSIS — I208 Other forms of angina pectoris: Secondary | ICD-10-CM | POA: Diagnosis not present

## 2019-04-27 DIAGNOSIS — N200 Calculus of kidney: Secondary | ICD-10-CM | POA: Diagnosis not present

## 2019-04-27 DIAGNOSIS — R112 Nausea with vomiting, unspecified: Secondary | ICD-10-CM | POA: Diagnosis present

## 2019-04-27 DIAGNOSIS — N183 Chronic kidney disease, stage 3 unspecified: Secondary | ICD-10-CM | POA: Diagnosis present

## 2019-04-27 DIAGNOSIS — R109 Unspecified abdominal pain: Secondary | ICD-10-CM | POA: Diagnosis present

## 2019-04-27 DIAGNOSIS — Z7951 Long term (current) use of inhaled steroids: Secondary | ICD-10-CM

## 2019-04-27 DIAGNOSIS — E860 Dehydration: Secondary | ICD-10-CM | POA: Diagnosis present

## 2019-04-27 DIAGNOSIS — E872 Acidosis: Secondary | ICD-10-CM | POA: Diagnosis present

## 2019-04-27 DIAGNOSIS — J410 Simple chronic bronchitis: Secondary | ICD-10-CM

## 2019-04-27 DIAGNOSIS — N179 Acute kidney failure, unspecified: Secondary | ICD-10-CM | POA: Diagnosis not present

## 2019-04-27 DIAGNOSIS — R197 Diarrhea, unspecified: Secondary | ICD-10-CM | POA: Diagnosis present

## 2019-04-27 DIAGNOSIS — J449 Chronic obstructive pulmonary disease, unspecified: Secondary | ICD-10-CM | POA: Diagnosis present

## 2019-04-27 DIAGNOSIS — E1169 Type 2 diabetes mellitus with other specified complication: Secondary | ICD-10-CM | POA: Diagnosis present

## 2019-04-27 DIAGNOSIS — M791 Myalgia, unspecified site: Secondary | ICD-10-CM

## 2019-04-27 DIAGNOSIS — Z20822 Contact with and (suspected) exposure to covid-19: Secondary | ICD-10-CM

## 2019-04-27 DIAGNOSIS — N170 Acute kidney failure with tubular necrosis: Secondary | ICD-10-CM | POA: Diagnosis not present

## 2019-04-27 DIAGNOSIS — E785 Hyperlipidemia, unspecified: Secondary | ICD-10-CM | POA: Diagnosis present

## 2019-04-27 DIAGNOSIS — Z888 Allergy status to other drugs, medicaments and biological substances status: Secondary | ICD-10-CM

## 2019-04-27 DIAGNOSIS — R0602 Shortness of breath: Secondary | ICD-10-CM | POA: Diagnosis present

## 2019-04-27 DIAGNOSIS — R778 Other specified abnormalities of plasma proteins: Secondary | ICD-10-CM

## 2019-04-27 DIAGNOSIS — R079 Chest pain, unspecified: Secondary | ICD-10-CM | POA: Diagnosis not present

## 2019-04-27 DIAGNOSIS — I2510 Atherosclerotic heart disease of native coronary artery without angina pectoris: Secondary | ICD-10-CM | POA: Diagnosis present

## 2019-04-27 DIAGNOSIS — I130 Hypertensive heart and chronic kidney disease with heart failure and stage 1 through stage 4 chronic kidney disease, or unspecified chronic kidney disease: Secondary | ICD-10-CM | POA: Diagnosis present

## 2019-04-27 DIAGNOSIS — Z20828 Contact with and (suspected) exposure to other viral communicable diseases: Secondary | ICD-10-CM | POA: Diagnosis present

## 2019-04-27 DIAGNOSIS — R7989 Other specified abnormal findings of blood chemistry: Secondary | ICD-10-CM | POA: Diagnosis not present

## 2019-04-27 DIAGNOSIS — Z8249 Family history of ischemic heart disease and other diseases of the circulatory system: Secondary | ICD-10-CM

## 2019-04-27 DIAGNOSIS — Z955 Presence of coronary angioplasty implant and graft: Secondary | ICD-10-CM

## 2019-04-27 DIAGNOSIS — R0789 Other chest pain: Secondary | ICD-10-CM

## 2019-04-27 DIAGNOSIS — I160 Hypertensive urgency: Secondary | ICD-10-CM | POA: Diagnosis not present

## 2019-04-27 DIAGNOSIS — F1020 Alcohol dependence, uncomplicated: Secondary | ICD-10-CM | POA: Diagnosis present

## 2019-04-27 DIAGNOSIS — I10 Essential (primary) hypertension: Secondary | ICD-10-CM | POA: Diagnosis present

## 2019-04-27 DIAGNOSIS — R0989 Other specified symptoms and signs involving the circulatory and respiratory systems: Secondary | ICD-10-CM | POA: Diagnosis not present

## 2019-04-27 DIAGNOSIS — R509 Fever, unspecified: Secondary | ICD-10-CM | POA: Diagnosis present

## 2019-04-27 DIAGNOSIS — N1832 Chronic kidney disease, stage 3b: Secondary | ICD-10-CM | POA: Diagnosis present

## 2019-04-27 DIAGNOSIS — I252 Old myocardial infarction: Secondary | ICD-10-CM

## 2019-04-27 DIAGNOSIS — E1165 Type 2 diabetes mellitus with hyperglycemia: Secondary | ICD-10-CM | POA: Diagnosis present

## 2019-04-27 DIAGNOSIS — F1410 Cocaine abuse, uncomplicated: Secondary | ICD-10-CM | POA: Diagnosis present

## 2019-04-27 DIAGNOSIS — E875 Hyperkalemia: Secondary | ICD-10-CM | POA: Diagnosis not present

## 2019-04-27 DIAGNOSIS — J111 Influenza due to unidentified influenza virus with other respiratory manifestations: Secondary | ICD-10-CM | POA: Diagnosis not present

## 2019-04-27 DIAGNOSIS — Z794 Long term (current) use of insulin: Secondary | ICD-10-CM

## 2019-04-27 DIAGNOSIS — Z79899 Other long term (current) drug therapy: Secondary | ICD-10-CM

## 2019-04-27 DIAGNOSIS — E1122 Type 2 diabetes mellitus with diabetic chronic kidney disease: Secondary | ICD-10-CM | POA: Diagnosis present

## 2019-04-27 DIAGNOSIS — N400 Enlarged prostate without lower urinary tract symptoms: Secondary | ICD-10-CM | POA: Diagnosis present

## 2019-04-27 DIAGNOSIS — R531 Weakness: Secondary | ICD-10-CM | POA: Diagnosis present

## 2019-04-27 DIAGNOSIS — K598 Other specified functional intestinal disorders: Secondary | ICD-10-CM | POA: Diagnosis not present

## 2019-04-27 DIAGNOSIS — E1159 Type 2 diabetes mellitus with other circulatory complications: Secondary | ICD-10-CM | POA: Diagnosis present

## 2019-04-27 DIAGNOSIS — Z7982 Long term (current) use of aspirin: Secondary | ICD-10-CM

## 2019-04-27 LAB — URINALYSIS, ROUTINE W REFLEX MICROSCOPIC
Bacteria, UA: NONE SEEN
Bilirubin Urine: NEGATIVE
Glucose, UA: NEGATIVE mg/dL
Ketones, ur: NEGATIVE mg/dL
Leukocytes,Ua: NEGATIVE
Nitrite: NEGATIVE
Protein, ur: 100 mg/dL — AB
Specific Gravity, Urine: 1.01 (ref 1.005–1.030)
pH: 5 (ref 5.0–8.0)

## 2019-04-27 LAB — BASIC METABOLIC PANEL
Anion gap: 9 (ref 5–15)
BUN: 23 mg/dL — ABNORMAL HIGH (ref 6–20)
CO2: 16 mmol/L — ABNORMAL LOW (ref 22–32)
Calcium: 8.6 mg/dL — ABNORMAL LOW (ref 8.9–10.3)
Chloride: 97 mmol/L — ABNORMAL LOW (ref 98–111)
Creatinine, Ser: 1.62 mg/dL — ABNORMAL HIGH (ref 0.61–1.24)
GFR calc Af Amer: 53 mL/min — ABNORMAL LOW (ref >60)
GFR calc non Af Amer: 45 mL/min — ABNORMAL LOW (ref >60)
Glucose, Bld: 186 mg/dL — ABNORMAL HIGH (ref 70–99)
Potassium: 4.4 mmol/L (ref 3.5–5.1)
Sodium: 122 mmol/L — ABNORMAL LOW (ref 135–145)

## 2019-04-27 LAB — CBG MONITORING, ED: Glucose-Capillary: 169 mg/dL — ABNORMAL HIGH (ref 70–99)

## 2019-04-27 LAB — FERRITIN: Ferritin: 922 ng/mL — ABNORMAL HIGH (ref 24–336)

## 2019-04-27 LAB — DIFFERENTIAL
Abs Immature Granulocytes: 0.01 10*3/uL (ref 0.00–0.07)
Basophils Absolute: 0.1 10*3/uL (ref 0.0–0.1)
Basophils Relative: 1 %
Eosinophils Absolute: 0.1 10*3/uL (ref 0.0–0.5)
Eosinophils Relative: 2 %
Immature Granulocytes: 0 %
Lymphocytes Relative: 34 %
Lymphs Abs: 2.6 10*3/uL (ref 0.7–4.0)
Monocytes Absolute: 0.6 10*3/uL (ref 0.1–1.0)
Monocytes Relative: 8 %
Neutro Abs: 4.3 10*3/uL (ref 1.7–7.7)
Neutrophils Relative %: 55 %

## 2019-04-27 LAB — TROPONIN I
Troponin I: 0.06 ng/mL (ref <0.03)
Troponin I: 0.05 ng/mL (ref <0.03)
Troponin I: 0.04 ng/mL (ref <0.03)

## 2019-04-27 LAB — COMPREHENSIVE METABOLIC PANEL
ALT: 28 U/L (ref 0–44)
AST: 46 U/L — ABNORMAL HIGH (ref 15–41)
Albumin: 5 g/dL (ref 3.5–5.0)
Alkaline Phosphatase: 66 U/L (ref 38–126)
Anion gap: 9 (ref 5–15)
BUN: 23 mg/dL — ABNORMAL HIGH (ref 6–20)
CO2: 15 mmol/L — ABNORMAL LOW (ref 22–32)
Calcium: 9.3 mg/dL (ref 8.9–10.3)
Chloride: 96 mmol/L — ABNORMAL LOW (ref 98–111)
Creatinine, Ser: 1.59 mg/dL — ABNORMAL HIGH (ref 0.61–1.24)
GFR calc Af Amer: 54 mL/min — ABNORMAL LOW (ref >60)
GFR calc non Af Amer: 46 mL/min — ABNORMAL LOW (ref >60)
Glucose, Bld: 177 mg/dL — ABNORMAL HIGH (ref 70–99)
Potassium: 5.5 mmol/L — ABNORMAL HIGH (ref 3.5–5.1)
Sodium: 120 mmol/L — ABNORMAL LOW (ref 135–145)
Total Bilirubin: 1.1 mg/dL (ref 0.3–1.2)
Total Protein: 8.5 g/dL — ABNORMAL HIGH (ref 6.5–8.1)

## 2019-04-27 LAB — CBC
HCT: 43.4 % (ref 39.0–52.0)
Hemoglobin: 16 g/dL (ref 13.0–17.0)
MCH: 29 pg (ref 26.0–34.0)
MCHC: 36.9 g/dL — ABNORMAL HIGH (ref 30.0–36.0)
MCV: 78.6 fL — ABNORMAL LOW (ref 80.0–100.0)
Platelets: 205 10*3/uL (ref 150–400)
RBC: 5.52 MIL/uL (ref 4.22–5.81)
RDW: 13.3 % (ref 11.5–15.5)
WBC: 7.6 10*3/uL (ref 4.0–10.5)
nRBC: 0 % (ref 0.0–0.2)

## 2019-04-27 LAB — ECHOCARDIOGRAM COMPLETE
Height: 71 in
Weight: 2928 oz

## 2019-04-27 LAB — CBC WITH DIFFERENTIAL/PLATELET
Abs Immature Granulocytes: 0.02 10*3/uL (ref 0.00–0.07)
Basophils Absolute: 0.1 10*3/uL (ref 0.0–0.1)
Basophils Relative: 1 %
Eosinophils Absolute: 0.2 10*3/uL (ref 0.0–0.5)
Eosinophils Relative: 3 %
HCT: 44.9 % (ref 39.0–52.0)
Hemoglobin: 16.1 g/dL (ref 13.0–17.0)
Immature Granulocytes: 0 %
Lymphocytes Relative: 38 %
Lymphs Abs: 2.9 10*3/uL (ref 0.7–4.0)
MCH: 28.1 pg (ref 26.0–34.0)
MCHC: 35.9 g/dL (ref 30.0–36.0)
MCV: 78.4 fL — ABNORMAL LOW (ref 80.0–100.0)
Monocytes Absolute: 0.7 10*3/uL (ref 0.1–1.0)
Monocytes Relative: 8 %
Neutro Abs: 3.9 10*3/uL (ref 1.7–7.7)
Neutrophils Relative %: 50 %
Platelets: 166 10*3/uL (ref 150–400)
RBC: 5.73 MIL/uL (ref 4.22–5.81)
RDW: 13.4 % (ref 11.5–15.5)
WBC: 7.8 10*3/uL (ref 4.0–10.5)
nRBC: 0 % (ref 0.0–0.2)

## 2019-04-27 LAB — CREATININE, SERUM
Creatinine, Ser: 1.6 mg/dL — ABNORMAL HIGH (ref 0.61–1.24)
GFR calc Af Amer: 53 mL/min — ABNORMAL LOW (ref >60)
GFR calc non Af Amer: 46 mL/min — ABNORMAL LOW (ref >60)

## 2019-04-27 LAB — LIPASE, BLOOD: Lipase: 23 U/L (ref 11–51)

## 2019-04-27 LAB — INFLUENZA PANEL BY PCR (TYPE A & B)
Influenza A By PCR: NEGATIVE
Influenza B By PCR: NEGATIVE

## 2019-04-27 LAB — RAPID URINE DRUG SCREEN, HOSP PERFORMED
Amphetamines: NOT DETECTED
Barbiturates: NOT DETECTED
Benzodiazepines: NOT DETECTED
Cocaine: POSITIVE — AB
Opiates: NOT DETECTED
Tetrahydrocannabinol: NOT DETECTED

## 2019-04-27 LAB — ETHANOL: Alcohol, Ethyl (B): <10 mg/dL (ref <10)

## 2019-04-27 LAB — C-REACTIVE PROTEIN: CRP: <0.8 mg/dL (ref <1.0)

## 2019-04-27 LAB — MRSA PCR SCREENING: MRSA by PCR: NEGATIVE

## 2019-04-27 LAB — GLUCOSE, CAPILLARY
Glucose-Capillary: 189 mg/dL — ABNORMAL HIGH (ref 70–99)
Glucose-Capillary: 204 mg/dL — ABNORMAL HIGH (ref 70–99)

## 2019-04-27 LAB — PROCALCITONIN: Procalcitonin: 0.1 ng/mL

## 2019-04-27 LAB — LACTATE DEHYDROGENASE: LDH: 147 U/L (ref 98–192)

## 2019-04-27 LAB — HEMOGLOBIN A1C
Hgb A1c MFr Bld: 7.1 % — ABNORMAL HIGH (ref 4.8–5.6)
Mean Plasma Glucose: 157.07 mg/dL

## 2019-04-27 LAB — SARS CORONAVIRUS 2 BY RT PCR (HOSPITAL ORDER, PERFORMED IN ~~LOC~~ HOSPITAL LAB): SARS Coronavirus 2: NEGATIVE

## 2019-04-27 LAB — D-DIMER, QUANTITATIVE: D-Dimer, Quant: 0.55 ug/mL-FEU — ABNORMAL HIGH (ref 0.00–0.50)

## 2019-04-27 MED ORDER — AMLODIPINE BESYLATE 5 MG PO TABS
5.0000 mg | ORAL_TABLET | Freq: Every day | ORAL | Status: DC
  Administered 2019-04-27 – 2019-04-30 (×4): 5 mg via ORAL
  Filled 2019-04-27 (×4): qty 1

## 2019-04-27 MED ORDER — IPRATROPIUM-ALBUTEROL 0.5-2.5 (3) MG/3ML IN SOLN: 3.0000 mL | RESPIRATORY_TRACT | Status: DC | PRN

## 2019-04-27 MED ORDER — HYDRALAZINE HCL 20 MG/ML IJ SOLN
10.0000 mg | Freq: Four times a day (QID) | INTRAMUSCULAR | Status: DC | PRN
  Administered 2019-04-27 (×2): 10 mg via INTRAVENOUS
  Filled 2019-04-27 (×3): qty 1

## 2019-04-27 MED ORDER — MORPHINE SULFATE (PF) 2 MG/ML IV SOLN
2.0000 mg | INTRAVENOUS | Status: DC | PRN
  Administered 2019-04-27: 2 mg via INTRAVENOUS
  Filled 2019-04-27: qty 1

## 2019-04-27 MED ORDER — ATORVASTATIN CALCIUM 40 MG PO TABS
40.0000 mg | ORAL_TABLET | Freq: Every day | ORAL | Status: DC
  Administered 2019-04-27 – 2019-04-29 (×3): 40 mg via ORAL
  Filled 2019-04-27 (×3): qty 1

## 2019-04-27 MED ORDER — LATANOPROST 0.005 % OP SOLN
1.0000 [drp] | Freq: Every day | OPHTHALMIC | Status: DC
  Administered 2019-04-27 – 2019-04-29 (×3): 1 [drp] via OPHTHALMIC
  Filled 2019-04-27: qty 2.5

## 2019-04-27 MED ORDER — MORPHINE SULFATE (PF) 4 MG/ML IV SOLN
4.0000 mg | Freq: Once | INTRAVENOUS | Status: AC
  Administered 2019-04-27: 4 mg via INTRAVENOUS

## 2019-04-27 MED ORDER — ALBUTEROL SULFATE HFA 108 (90 BASE) MCG/ACT IN AERS
2.0000 | INHALATION_SPRAY | Freq: Once | RESPIRATORY_TRACT | Status: AC
  Administered 2019-04-27: 2 via RESPIRATORY_TRACT
  Filled 2019-04-27: qty 6.7

## 2019-04-27 MED ORDER — HYDROCODONE-ACETAMINOPHEN 5-325 MG PO TABS
1.0000 | ORAL_TABLET | ORAL | Status: DC | PRN
  Administered 2019-04-27: 2 via ORAL
  Filled 2019-04-27: qty 2

## 2019-04-27 MED ORDER — IOHEXOL 300 MG/ML  SOLN: 30.0000 mL | Freq: Once | INTRAMUSCULAR | Status: DC | PRN

## 2019-04-27 MED ORDER — FOLIC ACID 1 MG PO TABS
1.0000 mg | ORAL_TABLET | Freq: Every day | ORAL | Status: DC
  Administered 2019-04-27 – 2019-04-30 (×4): 1 mg via ORAL
  Filled 2019-04-27 (×4): qty 1

## 2019-04-27 MED ORDER — ONDANSETRON HCL 4 MG/2ML IJ SOLN
4.0000 mg | Freq: Four times a day (QID) | INTRAMUSCULAR | Status: DC | PRN
  Administered 2019-04-27: 4 mg via INTRAVENOUS

## 2019-04-27 MED ORDER — SODIUM ZIRCONIUM CYCLOSILICATE 10 G PO PACK
10.0000 g | PACK | Freq: Once | ORAL | Status: AC
  Administered 2019-04-27: 10 g via ORAL
  Filled 2019-04-27 (×2): qty 1

## 2019-04-27 MED ORDER — ASPIRIN 81 MG PO CHEW
324.0000 mg | CHEWABLE_TABLET | Freq: Once | ORAL | Status: AC
  Administered 2019-04-27: 324 mg via ORAL
  Filled 2019-04-27: qty 4

## 2019-04-27 MED ORDER — HYDRALAZINE HCL 10 MG PO TABS
10.0000 mg | ORAL_TABLET | Freq: Three times a day (TID) | ORAL | Status: DC
  Administered 2019-04-27 – 2019-04-30 (×9): 10 mg via ORAL
  Filled 2019-04-27 (×9): qty 1

## 2019-04-27 MED ORDER — ACETAMINOPHEN 325 MG PO TABS
650.0000 mg | ORAL_TABLET | Freq: Four times a day (QID) | ORAL | Status: DC | PRN
  Administered 2019-04-29: 650 mg via ORAL
  Filled 2019-04-27: qty 2

## 2019-04-27 MED ORDER — NITROGLYCERIN 0.4 MG SL SUBL: 0.4000 mg | SUBLINGUAL_TABLET | SUBLINGUAL | Status: DC | PRN

## 2019-04-27 MED ORDER — BRIMONIDINE TARTRATE 0.2 % OP SOLN
1.0000 [drp] | Freq: Two times a day (BID) | OPHTHALMIC | Status: DC
  Administered 2019-04-27 – 2019-04-30 (×7): 1 [drp] via OPHTHALMIC
  Filled 2019-04-27: qty 5

## 2019-04-27 MED ORDER — MOMETASONE FURO-FORMOTEROL FUM 200-5 MCG/ACT IN AERO
2.0000 | INHALATION_SPRAY | Freq: Two times a day (BID) | RESPIRATORY_TRACT | Status: DC
  Administered 2019-04-27 – 2019-04-30 (×7): 2 via RESPIRATORY_TRACT
  Filled 2019-04-27: qty 8.8

## 2019-04-27 MED ORDER — INSULIN GLARGINE 100 UNIT/ML ~~LOC~~ SOLN
20.0000 [IU] | Freq: Every day | SUBCUTANEOUS | Status: DC
  Administered 2019-04-27 – 2019-04-30 (×4): 20 [IU] via SUBCUTANEOUS
  Filled 2019-04-27 (×4): qty 0.2

## 2019-04-27 MED ORDER — ACETAMINOPHEN 650 MG RE SUPP: 650.0000 mg | Freq: Four times a day (QID) | RECTAL | Status: DC | PRN

## 2019-04-27 MED ORDER — DOXAZOSIN MESYLATE 2 MG PO TABS
4.0000 mg | ORAL_TABLET | Freq: Every day | ORAL | Status: DC
  Administered 2019-04-27 – 2019-04-29 (×3): 4 mg via ORAL
  Filled 2019-04-27: qty 1
  Filled 2019-04-27 (×2): qty 2

## 2019-04-27 MED ORDER — ASPIRIN EC 81 MG PO TBEC
81.0000 mg | DELAYED_RELEASE_TABLET | Freq: Every day | ORAL | Status: DC
  Administered 2019-04-27 – 2019-04-30 (×4): 81 mg via ORAL
  Filled 2019-04-27 (×4): qty 1

## 2019-04-27 MED ORDER — CHLORHEXIDINE GLUCONATE CLOTH 2 % EX PADS: 6.0000 | MEDICATED_PAD | Freq: Every day | CUTANEOUS | Status: DC

## 2019-04-27 MED ORDER — ENOXAPARIN SODIUM 40 MG/0.4ML ~~LOC~~ SOLN
40.0000 mg | SUBCUTANEOUS | Status: DC
  Administered 2019-04-27 – 2019-04-30 (×4): 40 mg via SUBCUTANEOUS
  Filled 2019-04-27 (×4): qty 0.4

## 2019-04-27 MED ORDER — PANTOPRAZOLE SODIUM 40 MG PO TBEC
40.0000 mg | DELAYED_RELEASE_TABLET | Freq: Every day | ORAL | Status: DC
  Administered 2019-04-27 – 2019-04-30 (×4): 40 mg via ORAL
  Filled 2019-04-27 (×4): qty 1

## 2019-04-27 MED ORDER — SODIUM CHLORIDE 0.9 % IV BOLUS
1000.0000 mL | Freq: Once | INTRAVENOUS | Status: AC
  Administered 2019-04-27: 1000 mL via INTRAVENOUS

## 2019-04-27 MED ORDER — SODIUM CHLORIDE 0.9 % IV SOLN
INTRAVENOUS | Status: DC
  Administered 2019-04-27 – 2019-04-30 (×4): via INTRAVENOUS

## 2019-04-27 NOTE — Consult Note (Signed)
CONSULTATION NOTE   Patient Name: Jose [name] Date of Encounter: 04/27/2019 Cardiologist: No primary care provider on file.  Chief Complaint   Chest pain, dyspnea  Patient Profile   58 yo male with chest pain, DOE, myalgias, subjective fever and chills, abdominal pain and diarrhea, asked to evaluate for chest pain  HPI   Jose [name] is a 58 y.o. male who is being seen today for the evaluation of chest pain at the request of Dr. Isidoro Donningai.  This is a 58 year old male with a history of coronary artery disease status post MI in 2012 with 2 stents and cath in 2013 that showed a 99% left circumflex lesion, mild diffuse in-stent restenosis to the LAD and 20% distal left main disease managed medically.  He apparently had a negative [facility] in [facility].  He also has stage III chronic kidney disease, alcohol abuse, cocaine abuse, COPD, type 2 diabetes, hypertension and dyslipidemia, now presents with multiple complaints including chest pain, dyspnea on exertion, myalgias, subjective fever and chills, abdominal pain, and diarrhea.  He is COVID-19 negative by rapid testing.  Per the patient he has been having the above symptoms for about 4 days.  This morning he woke up with chest pain and shortness of breath.  The pain was midsternal, nonradiating and somewhat constant.  He is also had multiple episodes of diarrhea and has been on antibiotics.  He denies any sick contacts.  He was remotely seen by Dr. Elease HashimotoNahser and followed in cardiology in 2011.  He had an echocardiogram in October 2018 which was for chest pain and demonstrated normal 5 to 60%, moderate LVH and grade 1 diastolic dysfunction.  There is mild left atrial enlargement.  EKG here shows sinus rhythm with J-point elevation in the anteroseptal leads.  This appears unchanged compared to EKG in October 2018.  Very hypertensive on admission with systolics between 160-230 over diastolics in the 90s to 100s.  Labs are significantly abnormal  with a sodium of 120, potassium 5.5, creatinine 1.59, glucose of 177 and low CO2 of 15.  Initial troponin was mildly elevated 0.06.  UDS demonstrated cocaine positive.  Chest x-ray shows no acute disease.  PMHx   Past Medical History:  Diagnosis Date   Alcohol abuse    CAD (coronary artery disease)    a. Reported MI in 2012 s/p 2 stents;  b. 08/2012 Cath: LM 20, LAD 20 diff ISR, jailed septal - 99%, LCX 30ost, RI large, min irregs, RCA 30p, 20-30 ISR-->Med Rx; c. 2017 [facility].   CKD (chronic kidney disease), stage III (HCC)    Cocaine abuse (HCC)    COPD (chronic obstructive pulmonary disease) (HCC)    Depression    Diabetes mellitus without complication (HCC)    Hyperlipidemia    Hypertension    Noncompliance     Past Surgical History:  Procedure Laterality Date   CARDIAC CATHETERIZATION     CORONARY STENT PLACEMENT     LEFT HEART CATHETERIZATION WITH CORONARY ANGIOGRAM Bilateral 08/25/2012   Procedure: LEFT HEART CATHETERIZATION WITH CORONARY ANGIOGRAM;  Surgeon: Kathleene Hazelhristopher D McAlhany, MD;  [facility];  Service: Cardiovascular;  Laterality: Bilateral;    FAMHx   Family History  Problem Relation Age of Onset   Heart disease Mother        MI 7570s    SOCHx    reports that he quit smoking about 11 years ago. His smoking use included cigarettes.  He has never used smokeless tobacco. He reports current alcohol use. He reports current drug use. Drugs: Cocaine and Marijuana.  Outpatient Medications   No current facility-administered medications on file prior to encounter.    Current Outpatient Medications on File Prior to Encounter  Medication Sig Dispense Refill   acetaminophen (TYLENOL) 325 MG tablet Take 325 mg by mouth every 6 (six) hours as needed for headache (pain).     albuterol (PROVENTIL HFA;VENTOLIN HFA) 108 (90 BASE) MCG/ACT inhaler Inhale 2 puffs into the lungs every 6 (six) hours as needed for wheezing or shortness  of breath.      aspirin EC 81 MG tablet Take 81 mg by mouth daily.     atorvastatin (LIPITOR) 40 MG tablet Take 1 tablet (40 mg total) by mouth daily at 6 PM. 30 tablet 0   brimonidine (ALPHAGAN) 0.2 % ophthalmic solution Place 1 drop into both eyes 2 (two) times daily.      budesonide-formoterol (SYMBICORT) 160-4.5 MCG/ACT inhaler Inhale 2 puffs into the lungs 2 (two) times daily.     Cyanocobalamin (VITAMIN B-12 PO) Take 1 tablet by mouth daily.      doxazosin (CARDURA) 8 MG tablet Take 4 mg by mouth at bedtime.     ferrous sulfate 325 (65 FE) MG tablet Take 325 mg by mouth daily with breakfast.     folic acid (FOLVITE) 1 MG tablet Take 1 tablet (1 mg total) by mouth daily. 30 tablet 0   glucose 4 GM chewable tablet Chew 4 g by mouth daily as needed for low blood sugar.      insulin glargine (LANTUS) 100 UNIT/ML injection Inject 0.3 mLs (30 Units total) into the skin daily before breakfast.     latanoprost (XALATAN) 0.005 % ophthalmic solution Place 1 drop into both eyes at bedtime.      lisinopril (PRINIVIL,ZESTRIL) 10 MG tablet Take 2 tablets (20 mg total) by mouth daily. Restart tomorrow     Omega-3 Fatty Acids (FISH OIL) 1000 MG CAPS Take 1,000 mg by mouth 2 (two) times daily.      traZODone (DESYREL) 150 MG tablet Take 150 mg by mouth at bedtime.     insulin aspart (NOVOLOG) 100 UNIT/ML injection Inject 0-9 Units into the skin 3 (three) times daily with meals. (Patient not taking: Reported on 10/17/2015) 10 mL 2    Inpatient Medications    Scheduled Meds:  amLODipine  5 mg Oral Daily   aspirin EC  81 mg Oral Daily   atorvastatin  40 mg Oral q1800   brimonidine  1 drop Both Eyes BID   [START ON 04/28/2019] Chlorhexidine Gluconate Cloth  6 each Topical Daily   doxazosin  4 mg Oral QHS   enoxaparin (LOVENOX) injection  40 mg Subcutaneous Q24H   folic acid  1 mg Oral Daily   hydrALAZINE  10 mg Oral Q8H   insulin glargine  20 Units Subcutaneous Daily    latanoprost  1 drop Both Eyes QHS   mometasone-formoterol  2 puff Inhalation BID   pantoprazole  40 mg Oral Q0600   sodium zirconium cyclosilicate  10 g Oral Once    Continuous Infusions:  sodium chloride 100 mL/hr at 04/27/19 1000    PRN Meds: acetaminophen **OR** acetaminophen, hydrALAZINE, HYDROcodone-acetaminophen, iohexol, ipratropium-albuterol, morphine injection, nitroGLYCERIN, ondansetron (ZOFRAN) IV   ALLERGIES   Allergies  Allergen Reactions   Simvastatin Other (See Comments)    Reported by [facility] 04/10/16 - unknown reaction  ROS   Pertinent items noted in HPI and remainder of comprehensive ROS otherwise negative.  Vitals   Vitals:   04/27/19 1030 04/27/19 1040 04/27/19 1045 04/27/19 1100  BP:  (!) 205/92 (!) 167/80 (!) 153/103  Pulse: 61 72 65 65  Resp: 19 (!) 24 15   Temp:      TempSrc:      SpO2: 98% 98% 99% 99%  Weight:      Height:        Intake/Output Summary (Last 24 hours) at 04/27/2019 1110 Last data filed at 04/27/2019 1000 Gross per 24 hour  Intake 9.74 ml  Output --  Net 9.74 ml   Filed Weights   04/27/19 0142  Weight: 83 kg    Physical Exam   General appearance: alert and no distress Neck: no carotid bruit, no JVD and thyroid not enlarged, symmetric, no tenderness/mass/nodules Lungs: clear to auscultation bilaterally Heart: regular rate and rhythm Abdomen: soft, non-tender; bowel sounds normal; no masses,  no organomegaly Extremities: extremities normal, atraumatic, no cyanosis or edema Pulses: 2+ and symmetric Skin: Skin color, texture, turgor normal. No rashes or lesions Neurologic: Grossly normal Psych: Pleasant  Labs   Results for orders placed or performed during the hospital encounter of 04/27/19 (from the past 48 hour(s))  Urinalysis, Routine w reflex microscopic     Status: Abnormal   Collection Time: 04/27/19  2:41 AM  Result Value Ref Range   Color, Urine YELLOW YELLOW   APPearance CLEAR  CLEAR   Specific Gravity, Urine 1.010 1.005 - 1.030   pH 5.0 5.0 - 8.0   Glucose, UA NEGATIVE NEGATIVE mg/dL   Hgb urine dipstick SMALL (A) NEGATIVE   Bilirubin Urine NEGATIVE NEGATIVE   Ketones, ur NEGATIVE NEGATIVE mg/dL   Protein, ur 161 (A) NEGATIVE mg/dL   Nitrite NEGATIVE NEGATIVE   Leukocytes,Ua NEGATIVE NEGATIVE   RBC / HPF 0-5 0 - 5 RBC/hpf   WBC, UA 0-5 0 - 5 WBC/hpf   Bacteria, UA NONE SEEN NONE SEEN    Comment: Performed at [facility], [address], [place], [place] [address]  Rapid urine drug screen (hospital performed)     Status: Abnormal   Collection Time: 04/27/19  2:41 AM  Result Value Ref Range   Opiates NONE DETECTED NONE DETECTED   Cocaine POSITIVE (A) NONE DETECTED   Benzodiazepines NONE DETECTED NONE DETECTED   Amphetamines NONE DETECTED NONE DETECTED   Tetrahydrocannabinol NONE DETECTED NONE DETECTED   Barbiturates NONE DETECTED NONE DETECTED    Comment: (NOTE) DRUG SCREEN FOR MEDICAL PURPOSES ONLY.  IF CONFIRMATION IS NEEDED FOR ANY PURPOSE, NOTIFY LAB WITHIN 5 DAYS. LOWEST DETECTABLE LIMITS FOR URINE DRUG SCREEN Drug Class                     Cutoff (ng/mL) Amphetamine and metabolites    1000 Barbiturate and metabolites    200 Benzodiazepine                 200 Tricyclics and metabolites     300 Opiates and metabolites        300 Cocaine and metabolites        300 THC                            50 Performed at [facility], [address], [place], [place] [address]   POC CBG, ED  Status: Abnormal   Collection Time: 04/27/19  3:36 AM  Result Value Ref Range   Glucose-Capillary 169 (H) 70 - 99 mg/dL   Comment 1 Notify RN   SARS Coronavirus 2 (CEPHEID- Performed in [facility] hospital lab), Hosp Order     Status: None   Collection Time: 04/27/19  3:56 AM  Result Value Ref Range   SARS Coronavirus 2 NEGATIVE NEGATIVE    Comment: (NOTE) If result is NEGATIVE SARS-CoV-2 target nucleic acids are NOT  DETECTED. The SARS-CoV-2 RNA is generally detectable in upper and lower  respiratory specimens during the acute phase of infection. The lowest  concentration of SARS-CoV-2 viral copies this assay can detect is 250  copies / mL. A negative result does not preclude SARS-CoV-2 infection  and should not be used as the sole basis for treatment or other  patient management decisions.  A negative result may occur with  improper specimen collection / handling, submission of specimen other  than nasopharyngeal swab, presence of viral mutation(s) within the  areas targeted by this assay, and inadequate number of viral copies  (<250 copies / mL). A negative result must be combined with clinical  observations, patient history, and epidemiological information. If result is POSITIVE SARS-CoV-2 target nucleic acids are DETECTED. The SARS-CoV-2 RNA is generally detectable in upper and lower  respiratory specimens dur ing the acute phase of infection.  Positive  results are indicative of active infection with SARS-CoV-2.  Clinical  correlation with patient history and other diagnostic information is  necessary to determine patient infection status.  Positive results do  not rule out bacterial infection or co-infection with other viruses. If result is PRESUMPTIVE POSTIVE SARS-CoV-2 nucleic acids MAY BE PRESENT.   A presumptive positive result was obtained on the submitted specimen  and confirmed on repeat testing.  While 2019 novel coronavirus  (SARS-CoV-2) nucleic acids may be present in the submitted sample  additional confirmatory testing may be necessary for epidemiological  and / or clinical management purposes  to differentiate between  SARS-CoV-2 and other Sarbecovirus currently known to infect humans.  If clinically indicated additional testing with an alternate test  methodology [phone]) is advised. The SARS-CoV-2 RNA is generally  detectable in upper and lower respiratory sp ecimens during  the acute  phase of infection. The expected result is Negative. Fact Sheet for Patients:  BoilerBrush.com.cy Fact Sheet for Healthcare Providers: https://pope.com/ This test is not yet approved or cleared by the [place] FDA and has been authorized for detection and/or diagnosis of SARS-CoV-2 by FDA under an Emergency Use Authorization (EUA).  This EUA will remain in effect (meaning this test can be used) for the duration of the COVID-19 declaration under Section 564(b)(1) of the Act, 21 U.S.C. section 360bbb-3(b)(1), unless the authorization is terminated or revoked sooner. Performed at [facility], [address], [place], [place] [address]   CBC with Differential     Status: Abnormal   Collection Time: 04/27/19  6:01 AM  Result Value Ref Range   WBC 7.8 4.0 - 10.5 K/uL   RBC 5.73 4.22 - 5.81 MIL/uL   Hemoglobin 16.1 13.0 - 17.0 g/dL   HCT 81.1 91.4 - 78.2 %   MCV 78.4 (L) 80.0 - 100.0 fL   MCH 28.1 26.0 - 34.0 pg   MCHC 35.9 30.0 - 36.0 g/dL   RDW 95.6 21.3 - 08.6 %   Platelets 166 150 - 400 K/uL    Comment: REPEATED TO VERIFY  nRBC 0.0 0.0 - 0.2 %   Neutrophils Relative % 50 %   Neutro Abs 3.9 1.7 - 7.7 K/uL   Lymphocytes Relative 38 %   Lymphs Abs 2.9 0.7 - 4.0 K/uL   Monocytes Relative 8 %   Monocytes Absolute 0.7 0.1 - 1.0 K/uL   Eosinophils Relative 3 %   Eosinophils Absolute 0.2 0.0 - 0.5 K/uL   Basophils Relative 1 %   Basophils Absolute 0.1 0.0 - 0.1 K/uL   Immature Granulocytes 0 %   Abs Immature Granulocytes 0.02 0.00 - 0.07 K/uL    Comment: Performed at [facility], [address], [place], [place] [address]  Comprehensive metabolic panel     Status: Abnormal   Collection Time: 04/27/19  6:01 AM  Result Value Ref Range   Sodium 120 (L) 135 - 145 mmol/L   Potassium 5.5 (H) 3.5 - 5.1 mmol/L   Chloride 96 (L) 98 - 111 mmol/L   CO2 15 (L) 22 - 32 mmol/L   Glucose,  Bld 177 (H) 70 - 99 mg/dL   BUN 23 (H) 6 - 20 mg/dL   Creatinine, Ser 6.04 (H) 0.61 - 1.24 mg/dL   Calcium 9.3 8.9 - 54.0 mg/dL   Total Protein 8.5 (H) 6.5 - 8.1 g/dL   Albumin 5.0 3.5 - 5.0 g/dL   AST 46 (H) 15 - 41 U/L   ALT 28 0 - 44 U/L   Alkaline Phosphatase 66 38 - 126 U/L   Total Bilirubin 1.1 0.3 - 1.2 mg/dL   GFR calc non Af Amer 46 (L) >60 mL/min   GFR calc Af Amer 54 (L) >60 mL/min   Anion gap 9 5 - 15    Comment: Performed at [facility], [address] [place], [place] [address]  Troponin I - ONCE - STAT     Status: Abnormal   Collection Time: 04/27/19  6:01 AM  Result Value Ref Range   Troponin I 0.06 (HH) <0.03 ng/mL    Comment: CRITICAL RESULT CALLED TO, READ BACK BY AND VERIFIED WITH: J.FRICKEY RN AT 1478 ON 04/27/2019 BY S.VANHOORNE Performed at [facility], [address], [place], [place] [address]   Ethanol     Status: None   Collection Time: 04/27/19  6:02 AM  Result Value Ref Range   Alcohol, Ethyl (B) <10 <10 mg/dL    Comment: (NOTE) Lowest detectable limit for serum alcohol is 10 mg/dL. For medical purposes only. Performed at [facility], [address], [place], [place] [address]     ECG   Sinus rhythm with septal J-point elevation- Personally Reviewed  Telemetry   Sinus rhythm- Personally Reviewed  Radiology   Dg Chest Port 1 View  Result Date: 04/27/2019 CLINICAL DATA:  Flu like symptoms EXAM: PORTABLE CHEST 1 VIEW COMPARISON:  Chest x-ray dated 08/28/2017 FINDINGS: The heart size and mediastinal contours are within normal limits. Both lungs are clear. The visualized skeletal structures are unremarkable. IMPRESSION: No active disease. Electronically Signed   By: Katherine Mantle M.D.   On: 04/27/2019 02:58    Cardiac Studies   N/A  Impression   1. Principal Problem: 2.   Hyponatremia 3. Active Problems: 4.   Hypertension 5.   Acute renal failure superimposed on stage 3  chronic kidney disease (HCC) 6.   Dyslipidemia associated with type 2 diabetes mellitus (HCC) 7.   CKD (chronic kidney disease) stage 3, GFR 30-59 ml/min (HCC) 8.  COPD (chronic obstructive pulmonary disease) (HCC) 9.   Chest pain 10.   Diarrhea 11.   Recommendation   1. Mr. Poisson has multiple metabolic derangements which is the likely cause of his symptoms.  He is also noted to have hypertensive urgency which may have caused some of his chest pain symptoms.  In addition his UDS was positive for cocaine, possibly causing coronary spasm or ACS (which I think is less likely although his initial troponin was mildly elevated).  He has had 4 days of diarrhea and little oral intake and appears to be significantly dehydrated.  I suspect correction of his metabolic derangements will improve his symptoms significantly.  Would work to slowly decrease his blood pressure to a normotensive range.  Trend troponins, however low suspicion this is acute coronary syndrome.  He is appropriately on aspirin and atorvastatin.  Echo is pending.  Thanks for the consultation.  Cardiology will follow with you.  Time Spent Directly with Patient:  I have spent a total of 45 minutes with the patient reviewing hospital notes, telemetry, EKGs, labs and examining the patient as well as establishing an assessment and plan that was discussed personally with the patient.  > 50% of time was spent in direct patient care.  Length of Stay:  LOS: 0 days   Chrystie Nose, MD, [facility], FACP  [place]   [facility]  Medical Director of the Advanced Lipid Disorders &  Cardiovascular Risk Reduction Clinic Diplomate of the American Board of Clinical Lipidology Attending Cardiologist  Direct Dial: [phone]   Fax: [phone]  Website:  [email or web address] Nicely Tiago Humphrey 04/27/2019, 11:10 AM

## 2019-04-27 NOTE — ED Notes (Signed)
Pt upset, stating that he hasnt seen a doctor all night, he has chest tightness, and "want to see a doctor now!" Informed patient that hospitalist [name] just signed up for him and would be getting in contact with them to come see him.  Sent Message via Epic to Dr Isidoro Donning.   Also made [facility] PA aware.

## 2019-04-27 NOTE — ED Notes (Signed)
Hospitalist Dr Isidoro Donning at bedside

## 2019-04-27 NOTE — ED Notes (Signed)
Tresa Endo PA ED Provider at bedside.

## 2019-04-27 NOTE — ED Provider Notes (Signed)
[facility]-EMERGENCY DEPT Provider Note   CSN: [id number] Arrival date & time: 04/27/19  0132    History   Chief Complaint Chief Complaint  Patient presents with  . Influenza    HPI Jose [name] is a 58 y.o. male with a hx of hypertension, chronic kidney disease, COPD, insulin-dependent diabetes, hyperlipidemia, coronary artery disease, alcohol and cocaine abuse presents to the Emergency Department complaining of gradual, persistent, progressively worsening influenza-like illness onset 4 days ago.  Patient states this began with abdominal discomfort, nausea vomiting and diarrhea.  In the last 24 hours he has developed chest pain, shortness of breath, chest tightness and headache.  Additionally he complains of muscle cramps, myalgias, subjective fevers, chills and decreased appetite.  He also states generalized weakness and fatigue.  Patient reports no known sick contacts.  No treatments prior to arrival.  Nothing seems to make his symptoms better or worse.  Patient reports he has not been taking his regular medications over the last several days due to feeling so ill.      The history is provided by the patient and medical records. No language interpreter was used.    Past Medical History:  Diagnosis Date  . Alcohol abuse   . CAD (coronary artery disease)    a. Reported MI in 2012 s/p 2 stents;  b. 08/2012 Cath: LM 20, LAD 20 diff ISR, jailed septal - 99%, LCX 30ost, RI large, min irregs, RCA 30p, 20-30 ISR-->Med Rx; c. 2017 Pt reports Neg stress test @ [facility].  Marland Kitchen CKD (chronic kidney disease), stage III (HCC)   . Cocaine abuse (HCC)   . COPD (chronic obstructive pulmonary disease) (HCC)   . Depression   . Diabetes mellitus without complication (HCC)   . Hyperlipidemia   . Hypertension   . Noncompliance     Patient Active Problem List   Diagnosis Date Noted  . Chest pain 08/30/2017  . Chest pain with high risk for cardiac etiology 08/28/2017  . CKD (chronic  kidney disease) stage 3, GFR 30-59 ml/min (HCC) 08/28/2017  . COPD (chronic obstructive pulmonary disease) (HCC) 08/28/2017  . Hyperglycemia 10/17/2015  . Uncontrolled type 2 diabetes mellitus with diabetic nephropathy, with long-term current use of insulin (HCC) 10/17/2015  . Acute renal failure superimposed on stage 3 chronic kidney disease (HCC) 10/17/2015  . Hyperkalemia 10/17/2015  . Dyslipidemia associated with type 2 diabetes mellitus (HCC) 10/17/2015  . Alcohol abuse   . Cocaine abuse (HCC) 06/24/2015  . Hyponatremia 05/12/2015  . Hypertension     Past Surgical History:  Procedure Laterality Date  . CARDIAC CATHETERIZATION    . CORONARY STENT PLACEMENT    . LEFT HEART CATHETERIZATION WITH CORONARY ANGIOGRAM Bilateral 08/25/2012   Procedure: LEFT HEART CATHETERIZATION WITH CORONARY ANGIOGRAM;  Surgeon: Kathleene Hazel, MD;  Location: [facility] CATH LAB;  Service: Cardiovascular;  Laterality: Bilateral;        Home Medications    Prior to Admission medications   Medication Sig Start Date End Date Taking? Authorizing Provider  acetaminophen (TYLENOL) 325 MG tablet Take 325 mg by mouth every 6 (six) hours as needed for headache (pain).    [provider]  albuterol (PROVENTIL HFA;VENTOLIN HFA) 108 (90 BASE) MCG/ACT inhaler Inhale 2 puffs into the lungs every 6 (six) hours as needed for wheezing or shortness of breath.     [provider]  aspirin EC 81 MG tablet Take 81 mg by mouth daily.    [provider]  atorvastatin (  LIPITOR) 40 MG tablet Take 1 tablet (40 mg total) by mouth daily at 6 PM. 08/30/17   Zannie Cove, MD  brimonidine (ALPHAGAN) 0.2 % ophthalmic solution Place 1 drop into both eyes 2 (two) times daily.  04/11/15   [provider]  budesonide-formoterol (SYMBICORT) 160-4.5 MCG/ACT inhaler Inhale 2 puffs into the lungs 2 (two) times daily.    [provider]  Cyanocobalamin (VITAMIN B-12 PO) Take 1 tablet by mouth  daily.     [provider]  doxazosin (CARDURA) 8 MG tablet Take 4 mg by mouth at bedtime.    [provider]  ferrous sulfate 325 (65 FE) MG tablet Take 325 mg by mouth daily with breakfast.    [provider]  folic acid (FOLVITE) 1 MG tablet Take 1 tablet (1 mg total) by mouth daily. 07/25/15   Kathlen Mody, MD  glucose 4 GM chewable tablet Chew 4 g by mouth daily as needed for low blood sugar.     [provider]  insulin aspart (NOVOLOG) 100 UNIT/ML injection Inject 0-9 Units into the skin 3 (three) times daily with meals. Patient not taking: Reported on 10/17/2015 07/25/15   Kathlen Mody, MD  insulin glargine (LANTUS) 100 UNIT/ML injection Inject 0.3 mLs (30 Units total) into the skin daily before breakfast. 08/30/17   Zannie Cove, MD  latanoprost (XALATAN) 0.005 % ophthalmic solution Place 1 drop into both eyes at bedtime.  04/11/15   [provider]  lisinopril (PRINIVIL,ZESTRIL) 10 MG tablet Take 2 tablets (20 mg total) by mouth daily. Restart tomorrow 08/31/17   Zannie Cove, MD  Omega-3 Fatty Acids (FISH OIL) 1000 MG CAPS Take 1,000 mg by mouth 2 (two) times daily.     [provider]  traZODone (DESYREL) 150 MG tablet Take 150 mg by mouth at bedtime.    [provider]    Family History Family History  Problem Relation Age of Onset  . Heart disease Mother        MI 23s    Social History Social History   Tobacco Use  . Smoking status: Former Smoker    Types: Cigarettes    Last attempt to quit: 11/25/2007    Years since quitting: 11.4  . Smokeless tobacco: Never Used  Substance Use Topics  . Alcohol use: Yes    Comment: Used to drink heavily - says currently 2 beers/day.  . Drug use: Yes    Types: Cocaine, Marijuana    Comment: Relapsed into using both cocaine and marijuana - uses at least weekly.     Allergies   Simvastatin   Review of Systems Review of Systems  Constitutional: Positive for  activity change, appetite change, chills, fatigue and fever. Negative for diaphoresis and unexpected weight change.  HENT: Positive for congestion. Negative for mouth sores.   Eyes: Negative for visual disturbance.  Respiratory: Positive for cough and shortness of breath. Negative for chest tightness and wheezing.   Cardiovascular: Positive for chest pain.  Gastrointestinal: Positive for abdominal pain, diarrhea, nausea and vomiting. Negative for constipation.  Endocrine: Negative for polydipsia, polyphagia and polyuria.  Genitourinary: Negative for dysuria, frequency, hematuria and urgency.  Musculoskeletal: Positive for myalgias. Negative for back pain and neck stiffness.  Skin: Negative for rash.  Allergic/Immunologic: Negative for immunocompromised state.  Neurological: Positive for headaches. Negative for syncope and light-headedness.  Hematological: Does not bruise/bleed easily.  Psychiatric/Behavioral: Negative for sleep disturbance. The patient is not nervous/anxious.      Physical Exam  Updated Vital Signs BP (!) 161/103 (BP Location: Left Arm)   Pulse 68   Temp 98.3 F (36.8 C) (Oral)   Resp 20   Ht  (1.803 m)   Wt 83 kg   SpO2 100%   BMI 25.52 kg/m   Physical Exam Vitals signs and nursing note reviewed.  Constitutional:      General: He is not in acute distress.    Appearance: He is not diaphoretic.  HENT:     Head: Normocephalic.     Mouth/Throat:     Mouth: Mucous membranes are dry.  Eyes:     General: No scleral icterus.    Pupils: Pupils are equal, round, and reactive to light.  Neck:     Musculoskeletal: Normal range of motion.  Cardiovascular:     Rate and Rhythm: Normal rate and regular rhythm.     Pulses: Normal pulses.          Radial pulses are 2+ on the right side and 2+ on the left side.  Pulmonary:     Effort: No tachypnea, accessory muscle usage, prolonged expiration, respiratory distress or retractions.     Breath sounds: No stridor.      Comments: Equal chest rise. No increased work of breathing. Abdominal:     General: There is no distension.     Palpations: Abdomen is soft.     Tenderness: There is abdominal tenderness ( generalized). There is no guarding or rebound.  Musculoskeletal:     Comments: Moves all extremities equally and without difficulty.  Skin:    General: Skin is warm and dry.     Capillary Refill: Capillary refill takes less than 2 seconds.  Neurological:     Mental Status: He is alert.     GCS: GCS eye subscore is 4. GCS verbal subscore is 5. GCS motor subscore is 6.     Comments: Speech is clear and goal oriented.  Psychiatric:        Mood and Affect: Mood normal.      ED Treatments / Results  Labs (all labs ordered are listed, but only abnormal results are displayed) Labs Reviewed  CBC WITH DIFFERENTIAL/PLATELET - Abnormal; Notable for the following components:      Result Value   MCV 78.4 (*)    All other components within normal limits  COMPREHENSIVE METABOLIC PANEL - Abnormal; Notable for the following components:   Sodium 120 (*)    Potassium 5.5 (*)    Chloride 96 (*)    CO2 15 (*)    Glucose, Bld 177 (*)    BUN 23 (*)    Creatinine, Ser 1.59 (*)    Total Protein 8.5 (*)    AST 46 (*)    GFR calc non Af Amer 46 (*)    GFR calc Af Amer 54 (*)    All other components within normal limits  URINALYSIS, ROUTINE W REFLEX MICROSCOPIC - Abnormal; Notable for the following components:   Hgb urine dipstick SMALL (*)    Protein, ur 100 (*)    All other components within normal limits  TROPONIN I - Abnormal; Notable for the following components:   Troponin I 0.06 (*)    All other components within normal limits  RAPID URINE DRUG SCREEN, HOSP PERFORMED - Abnormal; Notable for the following components:   Cocaine POSITIVE (*)    All other components within normal limits  CBG MONITORING, ED - Abnormal; Notable for the following components:  Glucose-Capillary 169 (*)    All other  components within normal limits  SARS CORONAVIRUS 2 (HOSPITAL ORDER, PERFORMED IN [facility])  ETHANOL  INFLUENZA PANEL BY PCR (TYPE A & B)    EKG EKG Interpretation  Date/Time:  Wednesday April 27 2019 03:26:33 EDT Ventricular Rate:  67 PR Interval:    QRS Duration: 96 QT Interval:  400 QTC Calculation: 423 R Axis:   79 Text Interpretation:  Sinus rhythm ST elevation, consider anterior injury No significant change was found Confirmed by Paula LibraMolpus, John ([id number]) on 04/27/2019 3:42:21 AM   Radiology Dg Chest Port 1 View  Result Date: 04/27/2019 CLINICAL DATA:  Flu like symptoms EXAM: PORTABLE CHEST 1 VIEW COMPARISON:  Chest x-ray dated 08/28/2017 FINDINGS: The heart size and mediastinal contours are within normal limits. Both lungs are clear. The visualized skeletal structures are unremarkable. IMPRESSION: No active disease. Electronically Signed   By: Katherine Mantlehristopher  Green M.D.   On: 04/27/2019 02:58    Procedures Procedures (including critical care time)  Medications Ordered in ED Medications  aspirin chewable tablet 324 mg (has no administration in time range)  sodium chloride 0.9 % bolus 1,000 mL (has no administration in time range)     Initial Impression / Assessment and Plan / ED Course  I have reviewed the triage vital signs and the nursing notes.  Pertinent labs & imaging results that were available during my care of the patient were reviewed by me and considered in my medical decision making (see chart for details).  Clinical Course as of Apr 27 727  Wed Apr 27, 2019  0624 HTN persists  BP(!): 178/94 [HM]  [id number] No evidence of UTI  Nitrite: NEGATIVE [HM]  0625 WNL  WBC: 7.8 [HM]  0625 SARS Coronavirus 2: NEGATIVE [HM]  0625 COCAINE(!): POSITIVE [HM]  0625 Unchanged from previous  EKG 12-Lead [HM]  0625 No evidence of infiltrate, pneumothorax   DG Chest Port 1 View [HM]  [phone] [id number] Significant hyponatremia  Sodium(!): 120 [HM]  0652 elevated   Potassium(!): 5.5 [HM]  0652 baseline  Creatinine(!): 1.59 [HM]  0654 Pt has a hx of elevated troponin at 0.03.  Will need to trend.  ECG is unchanged from previous.  Reviewed by Dr. Read DriversMolpus.  Troponin I(!!): 0.06 [HM]    Clinical Course User Index [HM] Ahtziry Saathoff, Boyd KerbsHannah, PA-C        Dolores PattyOscar Engelbert was evaluated in Emergency Department on 04/27/2019 for the symptoms described in the history of present illness. He was evaluated in the context of the global COVID-19 pandemic, which necessitated consideration that the patient might be at risk for infection with the SARS-CoV-2 virus that causes COVID-19. Institutional protocols and algorithms that pertain to the evaluation of patients at risk for COVID-19 are in a state of rapid change based on information released by regulatory bodies including the CDC and federal and state organizations. These policies and algorithms were followed during the patient's care in the ED.   Patient presents to the emergency department with influenza-like illness.  Significant concern for COVID-19.  Additionally, patient complaining of chest pain and shortness of breath.  Patient has a history of CKD and COPD but no history of coronary artery disease.  Additionally he has a history of cocaine and alcohol abuse.  Patient's UDS is positive for cocaine tonight.  He is hypertensive on arrival but without tachycardia.  Labs and imaging pending along with a COVID-19 test.  At this time he has no respiratory distress and  is not requiring supplemental oxygen.  6:55 AM Patient labs have resulted.  Has an elevated troponin at 0.06 however previous evaluations have shown an elevated and consistent 0.03.  He is significantly hyponatremic.  Hyperkalemia is noted at 5.5.  No EKG changes to suggest cardiac effects.  Patient is receiving fluids.  COVID test is negative however, given symptoms and hx, I suspect this is a false negative.   Will be admitted for electrolyte abnormalities.  He  will need trending of his troponin.    Discussed with [facility] who will admit.   Final Clinical Impressions(s) / ED Diagnoses   Final diagnoses:  Hyponatremia  Elevated troponin  Myalgia  Nausea and vomiting, intractability of vomiting not specified, unspecified vomiting type  Suspected Covid-19 Virus Infection    ED Discharge Orders    None       Mardene Sayer Boyd Kerbs 04/27/19 0728    Molpus, Jonny Ruiz, MD 04/27/19 2239

## 2019-04-27 NOTE — H&P (Addendum)
History and Physical        Hospital Admission Note Date: 04/27/2019  Patient name: Jose [name] Medical record number: [id number] Date of birth: [birth year] Age: 58 y.o. Gender: male  PCP: Margot Ables, MD    Patient coming from: Home  I have reviewed all records in the [facility].    Chief Complaint:  Multiple complaints including chest pain, shortness of breath, headache, myalgias, subjective fever chills, abdominal pain, diarrhea in the last 4 days  HPI: Patient is a 58 year old male with hypertension, CKD stage III, history of cocaine use, IDDM type II, hypertension, hyperlipidemia presented to ED with multiple complaints as above.  Patient reported that he has been feeling miserable, has been having myalgias, generalized weakness, nausea vomiting and diarrhea for the last 4 days.  This morning, around midnight he woke up with chest pain, midsternal with shortness of breath, 7/10 with no radiation, persistent.  Complained of subjective fevers and chills, generalized weakness and fatigue.  Denied any hematochezia or melena.  Patient reported multiple episodes of diarrhea, also reports that he has been on 2 weeks of antibiotics, Patient reports no known sick contacts for COVID-19, does not work and has been at home. Patient also reports a remote history of CAD and had 3 stents in 1980s.   Rapid COVID-19 test negative  ED work-up/course:  In ED, temp 98.2, respiratory rate 13, pulse 58, BP initially 161/103, subsequently elevated to 232/99 Cocaine positive  Sodium 120, potassium 5.5, chloride 96, bicarb 15, anion gap 9, creatinine 1.5, troponin 0.06, hemoglobin 16.1   Review of Systems: Positives marked in 'bold' Constitutional: + fever, chills, poor appetite and fatigue.  HEENT: Denies photophobia, eye pain, redness, hearing loss, ear pain, congestion,  sore throat, rhinorrhea, sneezing, mouth sores, trouble swallowing, neck pain, neck stiffness and tinnitus.   Respiratory: Please see HPI Cardiovascular: Please see HPI Gastrointestinal: Please see HPI Genitourinary: Denies dysuria, urgency, frequency, hematuria, flank pain and difficulty urinating.  Musculoskeletal: Denies myalgias, back pain, joint swelling, arthralgias and gait problem.  Skin: Denies pallor, rash and wound.  Neurological: Denies dizziness, seizures, syncope, numbness and headaches. + Generalized weakness Hematological: Denies adenopathy. Easy bruising, personal or family bleeding history  Psychiatric/Behavioral: Denies suicidal ideation, mood changes, confusion, nervousness, sleep disturbance and agitation  Past Medical History: Past Medical History:  Diagnosis Date  . Alcohol abuse   . CAD (coronary artery disease)    a. Reported MI in 2012 s/p 2 stents;  b. 08/2012 Cath: LM 20, LAD 20 diff ISR, jailed septal - 99%, LCX 30ost, RI large, min irregs, RCA 30p, 20-30 ISR-->Med Rx; c. 2017 Pt reports Neg stress test @ [facility].  Marland Kitchen CKD (chronic kidney disease), stage III (HCC)   . Cocaine abuse (HCC)   . COPD (chronic obstructive pulmonary disease) (HCC)   . Depression   . Diabetes mellitus without complication (HCC)   . Hyperlipidemia   . Hypertension   . Noncompliance     Past Surgical History:  Procedure Laterality Date  . CARDIAC CATHETERIZATION    . CORONARY STENT PLACEMENT    . LEFT HEART CATHETERIZATION WITH CORONARY ANGIOGRAM Bilateral 08/25/2012   Procedure: LEFT HEART CATHETERIZATION  WITH CORONARY ANGIOGRAM;  Surgeon: Kathleene Hazel, MD;  Location: [facility] CATH LAB;  Service: Cardiovascular;  Laterality: Bilateral;    Medications: Prior to Admission medications   Medication Sig Start Date End Date Taking? Authorizing Provider  acetaminophen (TYLENOL) 325 MG tablet Take 325 mg by mouth every 6 (six) hours as needed for headache (pain).    [provider]  albuterol (PROVENTIL HFA;VENTOLIN HFA) 108 (90 BASE) MCG/ACT inhaler Inhale 2 puffs into the lungs every 6 (six) hours as needed for wheezing or shortness of breath.     [provider]  aspirin EC 81 MG tablet Take 81 mg by mouth daily.    [provider]  atorvastatin (LIPITOR) 40 MG tablet Take 1 tablet (40 mg total) by mouth daily at 6 PM. 08/30/17   Zannie Cove, MD  brimonidine (ALPHAGAN) 0.2 % ophthalmic solution Place 1 drop into both eyes 2 (two) times daily.  04/11/15   [provider]  budesonide-formoterol (SYMBICORT) 160-4.5 MCG/ACT inhaler Inhale 2 puffs into the lungs 2 (two) times daily.    [provider]  Cyanocobalamin (VITAMIN B-12 PO) Take 1 tablet by mouth daily.     [provider]  doxazosin (CARDURA) 8 MG tablet Take 4 mg by mouth at bedtime.    [provider]  ferrous sulfate 325 (65 FE) MG tablet Take 325 mg by mouth daily with breakfast.    [provider]  folic acid (FOLVITE) 1 MG tablet Take 1 tablet (1 mg total) by mouth daily. 07/25/15   Kathlen Mody, MD  glucose 4 GM chewable tablet Chew 4 g by mouth daily as needed for low blood sugar.     [provider]  insulin aspart (NOVOLOG) 100 UNIT/ML injection Inject 0-9 Units into the skin 3 (three) times daily with meals. Patient not taking: Reported on 10/17/2015 07/25/15   Kathlen Mody, MD  insulin glargine (LANTUS) 100 UNIT/ML injection Inject 0.3 mLs (30 Units total) into the skin daily before breakfast. 08/30/17   Zannie Cove, MD  latanoprost (XALATAN) 0.005 % ophthalmic solution Place 1 drop into both eyes at bedtime.  04/11/15   [provider]  lisinopril (PRINIVIL,ZESTRIL) 10 MG tablet Take 2 tablets (20 mg total) by mouth daily. Restart tomorrow 08/31/17   Zannie Cove, MD  Omega-3 Fatty Acids (FISH OIL) 1000 MG CAPS Take 1,000 mg by mouth 2 (two) times daily.     [provider]  traZODone (DESYREL)  150 MG tablet Take 150 mg by mouth at bedtime.    [provider]    Allergies:   Allergies  Allergen Reactions  . Simvastatin Other (See Comments)    Reported by [facility] 04/10/16 - unknown reaction    Social History:  reports that he quit smoking about 11 years ago. His smoking use included cigarettes. He has never used smokeless tobacco. He reports current alcohol use. He reports current drug use. Drugs: Cocaine and Marijuana.  Family History: Family History  Problem Relation Age of Onset  . Heart disease Mother        MI 52s    Physical Exam: Blood pressure (!) 168/107, pulse 64, temperature 98.2 F (36.8 C), temperature source Oral, resp. rate 14, height  (1.803 m), weight 83 kg, SpO2 99 %. General: Alert, awake, oriented x3, Uncomfortable Eyes: pink conjunctiva,anicteric sclera, pupils equal and reactive to light and accomodation, HEENT: normocephalic, atraumatic, oropharynx clear Neck: supple, no masses or lymphadenopathy, no goiter, no  bruits, no JVD CVS: Regular rate and rhythm, without murmurs, rubs or gallops. No lower extremity edema Resp : Clear to auscultation bilaterally, no wheezing, rales or rhonchi.  No chest wall tenderness GI : Soft, mild diffuse tender, nondistended, positive bowel sounds, no masses. No hepatomegaly. No hernia.  Musculoskeletal: No clubbing or cyanosis, positive pedal pulses. No contracture. ROM intact  Neuro: Grossly intact, no focal neurological deficits, strength 5/5 upper and lower extremities bilaterally Psych: alert and oriented x 3, normal mood and affect Skin: no rashes or lesions, warm and dry   LABS on Admission: I have personally reviewed all the labs and imagings below    Basic Metabolic Panel: Recent Labs  Lab 04/27/19 0601  NA 120*  K 5.5*  CL 96*  CO2 15*  GLUCOSE 177*  BUN 23*  CREATININE 1.59*  CALCIUM 9.3   Liver Function Tests: Recent Labs  Lab 04/27/19 0601  AST 46*  ALT  28  ALKPHOS 66  BILITOT 1.1  PROT 8.5*  ALBUMIN 5.0   No results for input(s): LIPASE, AMYLASE in the last 168 hours. No results for input(s): AMMONIA in the last 168 hours. CBC: Recent Labs  Lab 04/27/19 0601  WBC 7.8  NEUTROABS 3.9  HGB 16.1  HCT 44.9  MCV 78.4*  PLT 166   Cardiac Enzymes: Recent Labs  Lab 04/27/19 0601  TROPONINI 0.06*   BNP: Invalid input(s): POCBNP CBG: Recent Labs  Lab 04/27/19 0336  GLUCAP 169*    Radiological Exams on Admission:  Dg Chest Port 1 View  Result Date: 04/27/2019 CLINICAL DATA:  Flu like symptoms EXAM: PORTABLE CHEST 1 VIEW COMPARISON:  Chest x-ray dated 08/28/2017 FINDINGS: The heart size and mediastinal contours are within normal limits. Both lungs are clear. The visualized skeletal structures are unremarkable. IMPRESSION: No active disease. Electronically Signed   By: Katherine Mantle M.D.   On: 04/27/2019 02:58      EKG: Independently reviewed.  Rate 67, repolarization changes in the V2 and V3, similar to prior EKG in 2018   Assessment/Plan Principal Problem: Acute on chronic hyponatremia with NAG hyperchloremic metabolic acidosis -Likely due to nausea vomiting and diarrhea, dehydration, has not been tolerating anything for the last 4 days -Placed on antiemetics, IV fluid hydration -Recheck bmet later today -COVID-19 test negative, repeating again with influenza panel given flulike illness in the last 4 days -Also checking procalcitonin, LDH, CRP, d-dimer, ferritin  Active Problems: Chest pain with elevated troponin, remote history of CAD, grade 1 diastolic CHF -Per patient, has remote history of CAD with 3 stents in 1980s  - 2D echo 08/2017 had shown EF of 55 to 60% with grade 1 diastolic dysfunction -Currently presenting with accelerated hypertension, chest pain, shortness of breath, elevated troponin -Discussed with Dr. Clifton James, cardiology who reviewed the EKG, no ST elevation suggesting STEMI, repolarization  changes compared and similar to prior EKG in 2018 - -continue telemetry, serial cardiac enzymes, cardiology will follow patient.   Accelerated hypertension -BP 232/99, cocaine positive -No beta-blocker due to cocaine use -Placed on scheduled hydralazine and IV as needed with parameters, Norvasc 5 mg daily    Acute renal failure superimposed on stage 3 chronic kidney disease (HCC) -Baseline creatinine 1.5-1.6 -Currently presenting with dehydration, creatinine 1.59, continue gentle hydration, recheck bmet  Nausea vomiting, diarrhea, abdominal pain -Will obtain CT abdomen and pelvis, lipase - patient also reports antibiotic use for the last 2 weeks, check C. difficile PCR -For now clear liquid diet, gentle hydration, antiemetics Addendum  3:00pm CT abdomen reviewed and consistent with diffuse small bowel ileus.  5 mm nonobstructive renal stone on right  Hyperkalemia 5.5 - will give Lokelma x1, no acute EKG changes     Dyslipidemia associated with type 2 diabetes mellitus (HCC) -Continue Lipitor    COPD (chronic obstructive pulmonary disease) (HCC) -Currently stable, no wheezing -Continue duo nebs as needed  Diabetes mellitus type 2 IDDM, uncontrolled with complications CKD stage III -Placed on Lantus 20 units daily, sliding scale insulin sensitive -Follow hemoglobin A1c   DVT prophylaxis: Lovenox  CODE STATUS: Full code Consults called: Cardiology, discussed with Dr. Clifton JamesMcalhany  Family Communication: Admission, patients condition and plan of care including tests being ordered have been discussed with the patient  who indicates understanding and agree with the plan and Code Status  Admission status: Stepdown inpatient  Disposition plan: Further plan will depend as patient's clinical course evolves and further radiologic and laboratory data become available.    At the time of admission, it appears that the appropriate admission status for this patient is INPATIENT . This is  judged to be reasonable and necessary in order to provide the required intensity of service to ensure the patient's safety given the presenting symptoms intractable nausea vomiting diarrhea abdominal pain chest pain, elevated troponins and multiple electrolyte abnormalities, physical exam findings, and initial radiographic and laboratory data in the context of their chronic comorbidities.  The medical decision making on this patient was of high complexity and the patient is at high risk for clinical deterioration, therefore this is a level 3 visit.   Time Spent on Admission: 70 minutes    Eadie Repetto M.D. [facility] 04/27/2019, 10:10 AM

## 2019-04-27 NOTE — Progress Notes (Signed)
Lab unable to obtain labwork at this time due to patient being dry. MD made aware. They will try again later

## 2019-04-27 NOTE — Progress Notes (Signed)
  Echocardiogram 2D Echocardiogram has been performed.  Jose [name] 04/27/2019, 3:40 PM

## 2019-04-27 NOTE — ED Notes (Signed)
Pt ambulatory in triage with an unsteady gait.

## 2019-04-27 NOTE — ED Notes (Signed)
Called main lab to see if FLU panel could be ran from Covid swab. Swab can not be ran. Main lab out of Flu swabs at this time. Awaiting to get some from [facility].

## 2019-04-28 DIAGNOSIS — Z20822 Contact with and (suspected) exposure to covid-19: Secondary | ICD-10-CM

## 2019-04-28 DIAGNOSIS — R112 Nausea with vomiting, unspecified: Secondary | ICD-10-CM

## 2019-04-28 LAB — SARS CORONAVIRUS 2 BY RT PCR (HOSPITAL ORDER, PERFORMED IN ~~LOC~~ HOSPITAL LAB): SARS Coronavirus 2: NEGATIVE

## 2019-04-28 LAB — CBC
HCT: 40.6 % (ref 39.0–52.0)
Hemoglobin: 14.3 g/dL (ref 13.0–17.0)
MCH: 27.8 pg (ref 26.0–34.0)
MCHC: 35.2 g/dL (ref 30.0–36.0)
MCV: 79 fL — ABNORMAL LOW (ref 80.0–100.0)
Platelets: 191 10*3/uL (ref 150–400)
RBC: 5.14 MIL/uL (ref 4.22–5.81)
RDW: 13.3 % (ref 11.5–15.5)
WBC: 5.3 10*3/uL (ref 4.0–10.5)
nRBC: 0 % (ref 0.0–0.2)

## 2019-04-28 LAB — BASIC METABOLIC PANEL
Anion gap: 6 (ref 5–15)
BUN: 21 mg/dL — ABNORMAL HIGH (ref 6–20)
CO2: 14 mmol/L — ABNORMAL LOW (ref 22–32)
Calcium: 8.3 mg/dL — ABNORMAL LOW (ref 8.9–10.3)
Chloride: 106 mmol/L (ref 98–111)
Creatinine, Ser: 1.4 mg/dL — ABNORMAL HIGH (ref 0.61–1.24)
GFR calc Af Amer: >60 mL/min (ref >60)
GFR calc non Af Amer: 54 mL/min — ABNORMAL LOW (ref >60)
Glucose, Bld: 85 mg/dL (ref 70–99)
Potassium: 3.9 mmol/L (ref 3.5–5.1)
Sodium: 126 mmol/L — ABNORMAL LOW (ref 135–145)

## 2019-04-28 LAB — NA AND K (SODIUM & POTASSIUM), RAND UR
Potassium Urine: 14 mmol/L
Sodium, Ur: 83 mmol/L

## 2019-04-28 LAB — CHLORIDE, URINE, RANDOM: Chloride Urine: 99 mmol/L

## 2019-04-28 LAB — GLUCOSE, CAPILLARY
Glucose-Capillary: 127 mg/dL — ABNORMAL HIGH (ref 70–99)
Glucose-Capillary: 135 mg/dL — ABNORMAL HIGH (ref 70–99)
Glucose-Capillary: 100 mg/dL — ABNORMAL HIGH (ref 70–99)
Glucose-Capillary: 191 mg/dL — ABNORMAL HIGH (ref 70–99)
Glucose-Capillary: 188 mg/dL — ABNORMAL HIGH (ref 70–99)
Glucose-Capillary: 106 mg/dL — ABNORMAL HIGH (ref 70–99)

## 2019-04-28 LAB — HIV ANTIBODY (ROUTINE TESTING W REFLEX): HIV Screen 4th Generation wRfx: NONREACTIVE

## 2019-04-28 MED ORDER — TRAZODONE HCL 50 MG PO TABS
50.0000 mg | ORAL_TABLET | Freq: Once | ORAL | Status: AC
  Administered 2019-04-28: 50 mg via ORAL
  Filled 2019-04-28: qty 1

## 2019-04-28 MED ORDER — ISOSORBIDE DINITRATE 5 MG PO TABS
5.0000 mg | ORAL_TABLET | Freq: Two times a day (BID) | ORAL | Status: DC
  Filled 2019-04-28 (×2): qty 1

## 2019-04-28 NOTE — Progress Notes (Signed)
Inpatient Diabetes Program Recommendations  AACE/ADA: New Consensus Statement on Inpatient Glycemic Control (2015)  Target Ranges:  Prepandial:   less than 140 mg/dL      Peak postprandial:   less than 180 mg/dL (1-2 hours)      Critically ill patients:  140 - 180 mg/dL   Lab Results  Component Value Date   GLUCAP 135 (H) 04/28/2019   HGBA1C 7.1 (H) 04/27/2019    Review of Glycemic Control  Diabetes history: DM2 Outpatient Diabetes medications: Lantus 30 units QD Current orders for Inpatient glycemic control: Lantus 20 units QD  HgbA1C - 7.1%.  Inpatient Diabetes Program Recommendations:     Add Novolog 0-9 units tidwc  Will continue to follow.  Thank you. Ailene Ards, RD, LDN, CDE Inpatient Diabetes Coordinator [phone]

## 2019-04-28 NOTE — Progress Notes (Signed)
Progress Note  Patient Name: Jose [name] Date of Encounter: 04/28/2019  Primary Cardiologist: Previously seen at the [place].  Subjective   No significant overnight events. Patient is being rule out of COVID-19. In an effort to conserve PPE, did not enter patient's room.   Inpatient Medications    Scheduled Meds: . amLODipine  5 mg Oral Daily  . aspirin EC  81 mg Oral Daily  . atorvastatin  40 mg Oral q1800  . brimonidine  1 drop Both Eyes BID  . Chlorhexidine Gluconate Cloth  6 each Topical Daily  . doxazosin  4 mg Oral QHS  . enoxaparin (LOVENOX) injection  40 mg Subcutaneous Q24H  . folic acid  1 mg Oral Daily  . hydrALAZINE  10 mg Oral Q8H  . insulin glargine  20 Units Subcutaneous Daily  . latanoprost  1 drop Both Eyes QHS  . mometasone-formoterol  2 puff Inhalation BID  . pantoprazole  40 mg Oral Q0600   Continuous Infusions: . sodium chloride 100 mL/hr at 04/27/19 1100   PRN Meds: acetaminophen **OR** acetaminophen, hydrALAZINE, HYDROcodone-acetaminophen, iohexol, ipratropium-albuterol, morphine injection, nitroGLYCERIN, ondansetron (ZOFRAN) IV   Vital Signs    Vitals:   04/28/19 0400 04/28/19 0551 04/28/19 0600 04/28/19 0700  BP: (!) 155/68 (!) 153/74 (!) 141/114 131/70  Pulse: (!) 54  (!) 59 66  Resp: 13  11 16   Temp:      TempSrc:      SpO2: 99%  99% 99%  Weight:      Height:        Intake/Output Summary (Last 24 hours) at 04/28/2019 0804 Last data filed at 04/28/2019 0400 Gross per 24 hour  Intake 2409.77 ml  Output 1525 ml  Net 884.77 ml   Filed Weights   04/27/19 0142  Weight: 83 kg    Telemetry    Sinus rhythm with heart rates ranging in the 50's to 80's and 1st degree AV block. One missed beat noted with pause <2 seconds.  - Personally Reviewed  ECG    No new ECG tracing today. - Personally Reviewed  Physical Exam   Patient being ruled out for COVID-19. Repeat test pending. In an effort to conserve PPE, did not go into room to physically  examine patient. MD to follow with exam.  Labs    Chemistry Recent Labs  Lab 04/27/19 0601 04/27/19 1348 04/28/19 0320  NA 120* 122* 126*  K 5.5* 4.4 3.9  CL 96* 97* 106  CO2 15* 16* 14*  GLUCOSE 177* 186* 85  BUN 23* 23* 21*  CREATININE 1.59* 1.60*  1.62* 1.40*  CALCIUM 9.3 8.6* 8.3*  PROT 8.5*  --   --   ALBUMIN 5.0  --   --   AST 46*  --   --   ALT 28  --   --   ALKPHOS 66  --   --   BILITOT 1.1  --   --   GFRNONAA 46* 46*  45* 54*  GFRAA 54* 53*  53* >60  ANIONGAP 9 9 6      Hematology Recent Labs  Lab 04/27/19 0601 04/27/19 1348 04/28/19 0320  WBC 7.8 7.6 5.3  RBC 5.73 5.52 5.14  HGB 16.1 16.0 14.3  HCT 44.9 43.4 40.6  MCV 78.4* 78.6* 79.0*  MCH 28.1 29.0 27.8  MCHC 35.9 36.9* 35.2  RDW 13.4 13.3 13.3  PLT 166 205 191    Cardiac Enzymes Recent Labs  Lab 04/27/19 0601 04/27/19 1348 04/27/19  2101  TROPONINI 0.06* 0.05* 0.04*   No results for input(s): TROPIPOC in the last 168 hours.   BNPNo results for input(s): BNP, PROBNP in the last 168 hours.   DDimer  Recent Labs  Lab 04/27/19 1348  DDIMER 0.55*     Radiology    Ct Abdomen Pelvis Wo Contrast  Result Date: 04/27/2019 CLINICAL DATA:  Acute on chronic hyponatremia with nausea and vomiting EXAM: CT ABDOMEN AND PELVIS WITHOUT CONTRAST TECHNIQUE: Multidetector CT imaging of the abdomen and pelvis was performed following the standard protocol without IV contrast. COMPARISON:  None. FINDINGS: Lower chest: No acute abnormality. Hepatobiliary: No focal liver abnormality is seen. No gallstones, gallbladder wall thickening, or biliary dilatation. Pancreas: Unremarkable. No pancreatic ductal dilatation or surrounding inflammatory changes. Spleen: Normal in size without focal abnormality. Adrenals/Urinary Tract: Adrenal glands are within normal limits bilaterally. 5 mm nonobstructing right renal stone is noted. No ureteral stones are seen. The bladder is decompressed. Stomach/Bowel: Stomach is  decompressed. Small bowel dilatation is identified throughout the jejunum and ileum. The distal ileum is within normal limits. The appendix is unremarkable. The colon is air filled with mild fluid within without definitive obstructive change. A true transition zone is not well appreciated. These changes may represent a diffuse small bowel ileus. Correlation with the physical exam is recommended. Vascular/Lymphatic: Aortic atherosclerosis. No enlarged abdominal or pelvic lymph nodes. Reproductive: Prostate is unremarkable. Other: No abdominal wall hernia or abnormality. No abdominopelvic ascites. Musculoskeletal: No acute or significant osseous findings. IMPRESSION: Diffuse small small-bowel dilatation with the exception of the most distal aspect of the ileum. This may represent a diffuse small bowel ileus. Correlation with the physical exam is recommended. An actual mass or other focal abnormality causing a transition zone is not well appreciated on today's exam. 5 mm nonobstructing right renal stone. Electronically Signed   By: Alcide Clever M.D.   On: 04/27/2019 13:04   Dg Chest Port 1 View  Result Date: 04/27/2019 CLINICAL DATA:  Flu like symptoms EXAM: PORTABLE CHEST 1 VIEW COMPARISON:  Chest x-ray dated 08/28/2017 FINDINGS: The heart size and mediastinal contours are within normal limits. Both lungs are clear. The visualized skeletal structures are unremarkable. IMPRESSION: No active disease. Electronically Signed   By: Katherine Mantle M.D.   On: 04/27/2019 02:58    Cardiac Studies   Echocardiogram 04/27/2019: 1. The left ventricle has normal systolic function with an ejection fraction of 60-65%. The cavity size was normal. There is moderately increased left ventricular wall thickness. Left ventricular diastolic Doppler parameters are consistent with impaired relaxation. No evidence of left ventricular regional wall motion abnormalities.  2. The right ventricle has normal systolic function. The cavity  was normal. There is no increase in right ventricular wall thickness. Right ventricular systolic pressure could not be assessed.  3. The aortic valve is tricuspid. Mild sclerosis of the aortic valve.  Patient Profile   Jose [name] is a 58 y.o. male with a history of CAD s/p 2-3 stents, hypertension, hyperlipidemia, diabetes mellitus, COPD, CKD stage III, depression, and cocaine use who presented to the ED on 04/27/2019 with multiple complaints including subjective fever, myalgias, chills, decreased appetite, abdominal pain, nausea, vomiting, and diarrhea as well as more recent chest pain and shortness of breath. Patient was found to have multiple metabolic abnormalities including a sodium of 120. Patient was admitted for further evaluation. Cardiology was consulted for evaluation of chest pain and elevated troponin.   Assessment & Plan    Chest Pain with  Known CAD and Elevated Troponin - EKG shows normal sinus rhythm with early repolarization changes but no acute ST/T changes compared to prior tracings.  - Troponin minimally elevated and flat/down-trending at 0.06 >> 0.05 >> 0.04.  - Echo showed LVEF of 60-65 with impaired relaxation but no regional wall motion abnormalities. - Continue Aspirin and high-intensity statin. Patient does not appear to be on a beta-blocker at home. Mild bradycardia noted at times on telemetry so will hold off on addition at this time. - Suspect symptoms are secondary to acute illness with vomiting, diarrhea, and multiple metabolic derangement. Elevated troponin likely represents demand ischemia in the setting of acute illness, severe hypertension with BP as high as 232/99, and possible coronary spasms due to cocaine use. Do not anticipate any additional ischemic work-up at this time.   Hypertension - BP improving. Most recent BP 131/70.  - Continue current medications (Amlodipine, Cardura, Hydralazine).  Hyponatremia - Sodium 120 on admission. Slowly improving with IV  fluids. Repeat 126 this morning. - Management per primary team.  Otherwise, primary team. - Metabolic acidosis - Nausea/vomiting/diarrhea/abdominal pain - Hyperkalemia - COPD - Type 2 diabetes   For questions or updates, please contact [facility] Please consult [email or web address] for contact info under Cardiology/STEMI.      Signed, Corrin Parkerallie E Jeffry Vogelsang, PA-C  04/28/2019, 8:04 AM

## 2019-04-28 NOTE — Progress Notes (Addendum)
[facility] PROGRESS NOTE  Jose [name] CEY:[id number] DOB: 1960-12-18 DOA: 04/27/2019 PCP: Margot Ables, MD   Narrative: 58 year old African-American male Prior cocaine abuse, chronic ethanolism, polysubstance abuse CAD with 3 stents 1980 PCI to RCA 2010, PCI to LAD 2011 Negative stress test 2017 IDDM type II on insulin HTN HLD CKD 3  Admit 6/3 [facility] Subjective midsternal chest pain + hypertensive urgency/emergency + shortness of breath - radiation + fevers + fatigue + diarrhea X 2 weeks  Cocaine +, sodium 120, K5.5, bicarb 15, AG = 9  Coronavirus negative testing was negative however this was supposed to be repeated and was not performed and so patient needed appropriate BP until second test was performed  A & Plan Coronavirus negative x2-discontinue precautions Acute hyponatremia sodium 120 on admission-chronic metabolic acidosis since 2018 Hyperkalemia on admission Lokelma given good effect-repeat labs in a.m. Chronic non-gap acidosis since 2018 potentially related to diarrhea versus type IV RTA causing loss of bicarb and hyperchloremia Would hold ACE inhibitor in the setting I do not see any other implicating agents--- discussed with Dr. Allena Katz of nephrology who recommends getting urinary anion gap-if this is positive then supportive management if this is negative then this is likely secondary to some GI losses from diarrhea greatly appreciate assistance Cocaine +, polysubstance abuse, chronic EtOH Long discussion-he was discussed this in the past with [facility] at both [place], [place] he will continue the same He does not need any help at this time Hypertensive urgency Blood pressure under better control, considering addition of Isordil-appreciate cardiology input See above discussion regarding ARB/ACE type II demand Troponin trend flat echo benign-appreciate cardiology input Continue aspirin 81, Lipitor 40 for secondary  prevention 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  Iron deficiency anemia MCV is low however hemoglobin still is appropriate Outpatient work-up and regular screening with colonoscopy Hyperlipidemia  DVT lovenoex  Code Status: full  Communication: none  Disposition Plan: SDU ---> tele Expect d/c in 24-48 hr   Christianne Zacher, MD  [facility] Via [employer] app OR [email or web address] 7PM-7AM contact night coverage as above 04/28/2019, 7:22 AM  LOS: 1 day   Consultants:  Cardiology  Procedures:  Echo is pending  Antimicrobials:  None  Interval history/Subjective: Awake alert coherent No distress No further diarrhea Hungry No chest pain Diarrhea seems to be resolved  Objective:  Vitals:  Vitals:   04/28/19 0400 04/28/19 0551  BP: (!) 155/68 (!) 153/74  Pulse: (!) 54   Resp: 13   Temp:    SpO2: 99%     Exam:  Awake coherent pleasant thick neck no JVD Mallampati 3 GCS 15 Eyes slightly bloodshot bilaterally S1-S2 no murmur telemetry reviewed sinus rhythm Chest clinically clear no added sound no rales no rhonchi Abdomen obese nontender no rebound No lower extremity edema Neurologically intact no focal deficit   I have personally reviewed the following:  DATA   Labs:  Sodium up from 120-1 26  BUN/creatinine down to 21/1.4  Troponin trend flat 0.04  Ferritin 922  Imaging studies:  Echocardiogram IMPRESSIONS    1. The left ventricle has normal systolic function with an ejection fraction of 60-65%. The cavity size was normal. There is moderately increased left ventricular wall thickness. Left ventricular diastolic Doppler parameters are consistent with impaired  relaxation. No evidence of left ventricular regional wall motion abnormalities.  2. The right  ventricle has normal systolic function. The cavity was normal.  There is no increase in right ventricular wall thickness. Right ventricular systolic pressure could not be assessed.  3. The aortic valve is tricuspid. Mild sclerosis of the aortic valve.   Scheduled Meds: . amLODipine  5 mg Oral Daily  . aspirin EC  81 mg Oral Daily  . atorvastatin  40 mg Oral q1800  . brimonidine  1 drop Both Eyes BID  . Chlorhexidine Gluconate Cloth  6 each Topical Daily  . doxazosin  4 mg Oral QHS  . enoxaparin (LOVENOX) injection  40 mg Subcutaneous Q24H  . folic acid  1 mg Oral Daily  . hydrALAZINE  10 mg Oral Q8H  . insulin glargine  20 Units Subcutaneous Daily  . latanoprost  1 drop Both Eyes QHS  . mometasone-formoterol  2 puff Inhalation BID  . pantoprazole  40 mg Oral Q0600   Continuous Infusions: . sodium chloride 100 mL/hr at 04/27/19 1100    Principal Problem:   Hyponatremia Active Problems:   Hypertension   Acute renal failure superimposed on stage 3 chronic kidney disease (HCC)   Dyslipidemia associated with type 2 diabetes mellitus (HCC)   CKD (chronic kidney disease) stage 3, GFR 30-59 ml/min (HCC)   COPD (chronic obstructive pulmonary disease) (HCC)   Chest pain   Diarrhea   LOS: 1 day

## 2019-04-29 DIAGNOSIS — R778 Other specified abnormalities of plasma proteins: Secondary | ICD-10-CM

## 2019-04-29 DIAGNOSIS — R7989 Other specified abnormal findings of blood chemistry: Secondary | ICD-10-CM

## 2019-04-29 LAB — BASIC METABOLIC PANEL
Anion gap: 8 (ref 5–15)
BUN: 25 mg/dL — ABNORMAL HIGH (ref 6–20)
CO2: 15 mmol/L — ABNORMAL LOW (ref 22–32)
Calcium: 9 mg/dL (ref 8.9–10.3)
Chloride: 105 mmol/L (ref 98–111)
Creatinine, Ser: 1.47 mg/dL — ABNORMAL HIGH (ref 0.61–1.24)
GFR calc Af Amer: 59 mL/min — ABNORMAL LOW (ref >60)
GFR calc non Af Amer: 51 mL/min — ABNORMAL LOW (ref >60)
Glucose, Bld: 135 mg/dL — ABNORMAL HIGH (ref 70–99)
Potassium: 4.1 mmol/L (ref 3.5–5.1)
Sodium: 128 mmol/L — ABNORMAL LOW (ref 135–145)

## 2019-04-29 LAB — CBC WITH DIFFERENTIAL/PLATELET
Abs Immature Granulocytes: 0.02 10*3/uL (ref 0.00–0.07)
Basophils Absolute: 0 10*3/uL (ref 0.0–0.1)
Basophils Relative: 1 %
Eosinophils Absolute: 0.3 10*3/uL (ref 0.0–0.5)
Eosinophils Relative: 5 %
HCT: 38.4 % — ABNORMAL LOW (ref 39.0–52.0)
Hemoglobin: 13.6 g/dL (ref 13.0–17.0)
Immature Granulocytes: 0 %
Lymphocytes Relative: 34 %
Lymphs Abs: 1.7 10*3/uL (ref 0.7–4.0)
MCH: 28.1 pg (ref 26.0–34.0)
MCHC: 35.4 g/dL (ref 30.0–36.0)
MCV: 79.3 fL — ABNORMAL LOW (ref 80.0–100.0)
Monocytes Absolute: 0.4 10*3/uL (ref 0.1–1.0)
Monocytes Relative: 9 %
Neutro Abs: 2.5 10*3/uL (ref 1.7–7.7)
Neutrophils Relative %: 51 %
Platelets: 184 10*3/uL (ref 150–400)
RBC: 4.84 MIL/uL (ref 4.22–5.81)
RDW: 13.5 % (ref 11.5–15.5)
WBC: 4.9 10*3/uL (ref 4.0–10.5)
nRBC: 0 % (ref 0.0–0.2)

## 2019-04-29 LAB — GLUCOSE, CAPILLARY
Glucose-Capillary: 116 mg/dL — ABNORMAL HIGH (ref 70–99)
Glucose-Capillary: 189 mg/dL — ABNORMAL HIGH (ref 70–99)
Glucose-Capillary: 174 mg/dL — ABNORMAL HIGH (ref 70–99)
Glucose-Capillary: 224 mg/dL — ABNORMAL HIGH (ref 70–99)

## 2019-04-29 MED ORDER — ISOSORBIDE MONONITRATE ER 30 MG PO TB24
15.0000 mg | ORAL_TABLET | Freq: Every day | ORAL | Status: DC
  Administered 2019-04-30: 15 mg via ORAL
  Filled 2019-04-29: qty 1

## 2019-04-29 MED ORDER — ISOSORBIDE DINITRATE 10 MG PO TABS
10.0000 mg | ORAL_TABLET | Freq: Two times a day (BID) | ORAL | Status: DC
  Filled 2019-04-29: qty 1

## 2019-04-29 NOTE — Care Management Important Message (Signed)
Important Message  Patient Details IM Letter given to Benjiman Core SW to present to the Patient Name: Jose [name] MRN: [id number] Date of Birth: 31-Oct-1961   Medicare Important Message Given:  Yes    Caren Macadam 04/29/2019, 10:15 AM

## 2019-04-29 NOTE — Plan of Care (Signed)
Pt's BP was 142/76 @ 2200. 5 mg Isordil held per Dr. Mahala Menghini. Pt had a good appetite this shift, has had good urinary OP (400 mls so far), and denies pain.

## 2019-04-29 NOTE — Progress Notes (Addendum)
Progress Note  Patient Name: Jose [name] Date of Encounter: 04/29/2019  Primary Cardiologist[facility]  Subjective   He reports feeling much better this morning. No recurrent chest pain. No SOB or palpitations  Inpatient Medications    Scheduled Meds: . amLODipine  5 mg Oral Daily  . aspirin EC  81 mg Oral Daily  . atorvastatin  40 mg Oral q1800  . brimonidine  1 drop Both Eyes BID  . Chlorhexidine Gluconate Cloth  6 each Topical Daily  . doxazosin  4 mg Oral QHS  . enoxaparin (LOVENOX) injection  40 mg Subcutaneous Q24H  . folic acid  1 mg Oral Daily  . hydrALAZINE  10 mg Oral Q8H  . insulin glargine  20 Units Subcutaneous Daily  . isosorbide dinitrate  5 mg Oral BID  . latanoprost  1 drop Both Eyes QHS  . mometasone-formoterol  2 puff Inhalation BID  . pantoprazole  40 mg Oral Q0600   Continuous Infusions: . sodium chloride 40 mL/hr at 04/28/19 2243   PRN Meds: acetaminophen **OR** acetaminophen, hydrALAZINE, HYDROcodone-acetaminophen, iohexol, ipratropium-albuterol, nitroGLYCERIN, ondansetron (ZOFRAN) IV   Vital Signs    Vitals:   04/28/19 2041 04/28/19 2200 04/29/19 0432 04/29/19 0740  BP: (!) 163/87 (!) 142/76 137/79   Pulse: 67 62 (!) 59   Resp: 16  20   Temp: 98.4 F (36.9 C)  98 F (36.7 C)   TempSrc: Oral  Oral   SpO2: 99%  100% 96%  Weight:      Height:        Intake/Output Summary (Last 24 hours) at 04/29/2019 0810 Last data filed at 04/29/2019 [id number] Gross per 24 hour  Intake 2554.94 ml  Output 1650 ml  Net 904.94 ml   Filed Weights   04/27/19 0142  Weight: 83 kg    Telemetry    Sinus rhythm with occasional PVCs - Personally Reviewed   Physical Exam   GEN: No acute distress.   Neck: No JVD, no carotid bruits Cardiac: RRR, no murmurs, rubs, or gallops.  Respiratory: Clear to auscultation bilaterally, no wheezes/ rales/ rhonchi GI: NABS, Soft, nontender, non-distended  MS: No edema; No deformity. Neuro:  Nonfocal,  moving all extremities spontaneously Psych: Normal affect   Labs    Chemistry Recent Labs  Lab 04/27/19 0601 04/27/19 1348 04/28/19 0320 04/29/19 0643  NA 120* 122* 126* 128*  K 5.5* 4.4 3.9 4.1  CL 96* 97* 106 105  CO2 15* 16* 14* 15*  GLUCOSE 177* 186* 85 135*  BUN 23* 23* 21* 25*  CREATININE 1.59* 1.60*  1.62* 1.40* 1.47*  CALCIUM 9.3 8.6* 8.3* 9.0  PROT 8.5*  --   --   --   ALBUMIN 5.0  --   --   --   AST 46*  --   --   --   ALT 28  --   --   --   ALKPHOS 66  --   --   --   BILITOT 1.1  --   --   --   GFRNONAA 46* 46*  45* 54* 51*  GFRAA 54* 53*  53* >60 59*  ANIONGAP 9 9 6 8      Hematology Recent Labs  Lab 04/27/19 1348 04/28/19 0320 04/29/19 0643  WBC 7.6 5.3 4.9  RBC 5.52 5.14 4.84  HGB 16.0 14.3 13.6  HCT 43.4 40.6 38.4*  MCV 78.6* 79.0* 79.3*  MCH 29.0 27.8 28.1  MCHC 36.9* 35.2 35.4  RDW 13.3 13.3 13.5  PLT 205 191 184    Cardiac Enzymes Recent Labs  Lab 04/27/19 0601 04/27/19 1348 04/27/19 2101  TROPONINI 0.06* 0.05* 0.04*   No results for input(s): TROPIPOC in the last 168 hours.   BNPNo results for input(s): BNP, PROBNP in the last 168 hours.   DDimer  Recent Labs  Lab 04/27/19 1348  DDIMER 0.55*     Radiology    Ct Abdomen Pelvis Wo Contrast  Result Date: 04/27/2019 CLINICAL DATA:  Acute on chronic hyponatremia with nausea and vomiting EXAM: CT ABDOMEN AND PELVIS WITHOUT CONTRAST TECHNIQUE: Multidetector CT imaging of the abdomen and pelvis was performed following the standard protocol without IV contrast. COMPARISON:  None. FINDINGS: Lower chest: No acute abnormality. Hepatobiliary: No focal liver abnormality is seen. No gallstones, gallbladder wall thickening, or biliary dilatation. Pancreas: Unremarkable. No pancreatic ductal dilatation or surrounding inflammatory changes. Spleen: Normal in size without focal abnormality. Adrenals/Urinary Tract: Adrenal glands are within normal limits bilaterally. 5 mm nonobstructing right renal  stone is noted. No ureteral stones are seen. The bladder is decompressed. Stomach/Bowel: Stomach is decompressed. Small bowel dilatation is identified throughout the jejunum and ileum. The distal ileum is within normal limits. The appendix is unremarkable. The colon is air filled with mild fluid within without definitive obstructive change. A true transition zone is not well appreciated. These changes may represent a diffuse small bowel ileus. Correlation with the physical exam is recommended. Vascular/Lymphatic: Aortic atherosclerosis. No enlarged abdominal or pelvic lymph nodes. Reproductive: Prostate is unremarkable. Other: No abdominal wall hernia or abnormality. No abdominopelvic ascites. Musculoskeletal: No acute or significant osseous findings. IMPRESSION: Diffuse small small-bowel dilatation with the exception of the most distal aspect of the ileum. This may represent a diffuse small bowel ileus. Correlation with the physical exam is recommended. An actual mass or other focal abnormality causing a transition zone is not well appreciated on today's exam. 5 mm nonobstructing right renal stone. Electronically Signed   By: Alcide Clever M.D.   On: 04/27/2019 13:04    Cardiac Studies   Echocardiogram 04/27/2019: 1. The left ventricle has normal systolic function with an ejection fraction of 60-65%. The cavity size was normal. There is moderately increased left ventricular wall thickness. Left ventricular diastolic Doppler parameters are consistent with impaired relaxation. No evidence of left ventricular regional wall motion abnormalities. 2. The right ventricle has normal systolic function. The cavity was normal. There is no increase in right ventricular wall thickness. Right ventricular systolic pressure could not be assessed. 3. The aortic valve is tricuspid. Mild sclerosis of the aortic valve.  Patient Profile     Jose [name] is a 58 y.o. male with a history of CAD s/p 2-3 stents, hypertension,  hyperlipidemia, diabetes mellitus, COPD, CKD stage III, depression, and cocaine use who presented to the ED on 04/27/2019 with multiple complaints including subjective fever, myalgias, chills, decreased appetite, abdominal pain, nausea, vomiting, and diarrhea as well as more recent chest pain and shortness of breath. Patient was found to have multiple metabolic abnormalities including a sodium of 120. Patient was admitted for further evaluation. Cardiology was consulted for evaluation of chest pain and elevated troponin.   Assessment & Plan    1. Chest pain in patient with non-obstructive CAD on cath in 2013:  Trop 0.06>0.05>0.04 - flat trend not consistent with ACS. EKG non-ischemic. Echo with EF 60-65%, G1DD, and no wall motion abnormalities. Trop elevation felt to be demand ischemia in the setting of acute illness,  severe HTN (BP 232/99 on admission), and possible coronary vasospasm due to cocaine.  - No further ischemic evaluation at this time.  - Continue aspirin and statin - Continue aggressive risk factor modifications (BP goal <130/90; LDL goal <70; Hgb A1C <7)  2. HTN: BP still persistently elevated. Started on isordil 5mg  BID yesterday.  - Continue amlodipine and doxazosin - Will increase isordil to 10mg  BID - Continue to avoid BBlockers given cocaine use.   3. Hyponatremia: continues to improve with IVFs. Na 128 today - Continue management per primary team  4. Polysubstance abuse: Utox +cocaine this admission. Possible this contributed to #1.  - Continue to encourage abstinence.   Otherwise, per primary team: - Nausea/vomiting/diarrhea/abdominal pain - COPD - CKD - DM type 2 - BPH  Patient states he follows at the [facility] for cardiology care but has not been seen by a cardiologist in quite some time. I encourage patient to get reconnected with his Cardiologist after discharge for ongoing management of his CAD, HTN, and HLD.    For questions or updates, please contact [facility]  Please consult [email or web address] for contact info under Cardiology/STEMI.      Signed, Beatriz Stallion, PA-C  04/29/2019, 8:10 AM   [phone]  Pt. Seen and examined. Agree with the Resident/NP/PA-C note as written. Feeling much better today - sodium improving. BP remains elevated, agree with increase in isordil.   Time Spent with Patient: I have spent a total of 25 minutes with patient reviewing hospital notes, telemetry, EKGs, labs and examining the patient as well as establishing an assessment and plan that was discussed with the patient. > 50% of time was spent in direct patient care.  Chrystie Nose, MD, [facility], FACP  [place]  [facility]  Medical Director of the [facility]  [facility] Diplomate of the American Board of Clinical Lipidology Attending Cardiologist  Direct Dial: [phone]  Fax: [phone]  Website:  [email or web address]

## 2019-04-29 NOTE — Progress Notes (Signed)
[facility] PROGRESS NOTE  Jose [name] ZOX:[id number] DOB: 1961/03/17 DOA: 04/27/2019 PCP: Margot Ableskwubunka-Anyim, [place]un[place], MD   Narrative: 58 year old African-American male Prior cocaine abuse, chronic ethanolism, polysubstance abuse CAD with 3 stents 1980 PCI to RCA 2010, PCI to LAD 2011 Negative stress test 2017 IDDM type II on insulin HTN HLD CKD 3  Admit 6/3 [facility] Subjective midsternal chest pain + hypertensive urgency/emergency + shortness of breath - radiation + fevers + fatigue + diarrhea X 2 weeks  Cocaine +, sodium 120, K5.5, bicarb 15, AG = 9  Coronavirus negative testing was negative however this was supposed to be repeated and was not performed and so patient needed appropriate BP until second test was performed  A & Plan Coronavirus negative x2-discontinue precautions Acute hyponatremia sodium 120 on admission-chronic metabolic acidosis since 2018 Hyperkalemia on admission Lokelma given good effect-repeat labs in a.m. Chronic non-gap acidosis since 2018 potentially related to diarrhea-Urine anion gap -2 suggestive of diarrhea be implicated above as a cause Would hold ACE inhibitor in the setting His sodium has come up nicely from 120-128 have increased back his fluids from 40 to 75 cc an hour and expect that if this is above 130 he could be stabilized for discharge--- he admits to me today that he also drinks 3-4 beers a day which could explain potomania from the same Would hold on Imodium at this time and monitor Cocaine +, polysubstance abuse, chronic EtOH Long discussion-he was discussed this in the past with [place] [facility] [place] he will continue the same He does not need any help at this time Hypertensive urgency Blood pressure under better control ContinueAmlodipine 5, Cardura 4 at bedtime hydralazine 10 3 times daily and will give prescription on discharge type II demand Troponin trend flat echo benign-appreciate  cardiology input Continue aspirin 81, Lipitor 40 for secondary prevention 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  Iron deficiency anemia MCV is low however hemoglobin still is appropriate Outpatient work-up and regular screening with colonoscopy Hyperlipidemia  DVT lovenoex  Code Status: full  Communication: none  Disposition Plan: SDU ---> tele Expect d/c in 24-48 hr   Joyia Riehle, MD  [facility] [employer] app OR [email or web address] 7PM-7AM contact night coverage as above 04/29/2019, 10:35 AM  LOS: 2 days   Consultants:  Cardiology  Procedures:  Echo is pending  Antimicrobials:  None  Interval history/Subjective: \ Awake pleasant no distress eating drinking I feel much better" Still with some diarrhea but better than before No chest pain Objective:  Vitals:  Vitals:   04/29/19 0432 04/29/19 0740  BP: 137/79   Pulse: (!) 59   Resp: 20   Temp: 98 F (36.7 C)   SpO2: 100% 96%    Exam:  Coherent pleasant S1-S2 no murmur rub or gallop Abdomen soft no rebound no guarding No lower extremity edema Otherwise neurologically intact moving all 4 limbs   I have personally reviewed the following:  DATA   Labs:  Sodium up from 120-1 26-->128  BUN/creatinine down to 21/1.4-->25/1.4  Troponin trend flat 0.04  Ferritin 922  Imaging studies:  Echocardiogram IMPRESSIONS    1. The left ventricle has normal systolic function with an ejection fraction of 60-65%. The cavity size was normal. There is moderately increased left ventricular wall thickness. Left ventricular diastolic Doppler parameters are consistent with impaired  relaxation. No evidence  of left ventricular regional wall motion abnormalities.  2. The right ventricle has normal systolic function. The cavity was normal. There is no increase in right  ventricular wall thickness. Right ventricular systolic pressure could not be assessed.  3. The aortic valve is tricuspid. Mild sclerosis of the aortic valve.   Scheduled Meds: . amLODipine  5 mg Oral Daily  . aspirin EC  81 mg Oral Daily  . atorvastatin  40 mg Oral q1800  . brimonidine  1 drop Both Eyes BID  . Chlorhexidine Gluconate Cloth  6 each Topical Daily  . doxazosin  4 mg Oral QHS  . enoxaparin (LOVENOX) injection  40 mg Subcutaneous Q24H  . folic acid  1 mg Oral Daily  . hydrALAZINE  10 mg Oral Q8H  . insulin glargine  20 Units Subcutaneous Daily  . isosorbide dinitrate  10 mg Oral BID  . latanoprost  1 drop Both Eyes QHS  . mometasone-formoterol  2 puff Inhalation BID  . pantoprazole  40 mg Oral Q0600   Continuous Infusions: . sodium chloride 40 mL/hr at 04/28/19 2243    Principal Problem:   Hyponatremia Active Problems:   Hypertension   Acute renal failure superimposed on stage 3 chronic kidney disease (HCC)   Dyslipidemia associated with type 2 diabetes mellitus (HCC)   CKD (chronic kidney disease) stage 3, GFR 30-59 ml/min (HCC)   COPD (chronic obstructive pulmonary disease) (HCC)   Chest pain   Diarrhea   Nausea and vomiting   Suspected Covid-19 Virus Infection   LOS: 2 days

## 2019-04-30 DIAGNOSIS — I2510 Atherosclerotic heart disease of native coronary artery without angina pectoris: Secondary | ICD-10-CM

## 2019-04-30 LAB — CBC WITH DIFFERENTIAL/PLATELET
Abs Immature Granulocytes: 0.01 10*3/uL (ref 0.00–0.07)
Basophils Absolute: 0 10*3/uL (ref 0.0–0.1)
Basophils Relative: 1 %
Eosinophils Absolute: 0.2 10*3/uL (ref 0.0–0.5)
Eosinophils Relative: 4 %
HCT: 36 % — ABNORMAL LOW (ref 39.0–52.0)
Hemoglobin: 13.3 g/dL (ref 13.0–17.0)
Immature Granulocytes: 0 %
Lymphocytes Relative: 38 %
Lymphs Abs: 2.3 10*3/uL (ref 0.7–4.0)
MCH: 29 pg (ref 26.0–34.0)
MCHC: 36.9 g/dL — ABNORMAL HIGH (ref 30.0–36.0)
MCV: 78.4 fL — ABNORMAL LOW (ref 80.0–100.0)
Monocytes Absolute: 0.5 10*3/uL (ref 0.1–1.0)
Monocytes Relative: 8 %
Neutro Abs: 3 10*3/uL (ref 1.7–7.7)
Neutrophils Relative %: 49 %
Platelets: 175 10*3/uL (ref 150–400)
RBC: 4.59 MIL/uL (ref 4.22–5.81)
RDW: 13.6 % (ref 11.5–15.5)
WBC: 6.1 10*3/uL (ref 4.0–10.5)
nRBC: 0 % (ref 0.0–0.2)

## 2019-04-30 LAB — BASIC METABOLIC PANEL
Anion gap: 8 (ref 5–15)
BUN: 24 mg/dL — ABNORMAL HIGH (ref 6–20)
CO2: 18 mmol/L — ABNORMAL LOW (ref 22–32)
Calcium: 9.1 mg/dL (ref 8.9–10.3)
Chloride: 105 mmol/L (ref 98–111)
Creatinine, Ser: 1.41 mg/dL — ABNORMAL HIGH (ref 0.61–1.24)
GFR calc Af Amer: >60 mL/min (ref >60)
GFR calc non Af Amer: 54 mL/min — ABNORMAL LOW (ref >60)
Glucose, Bld: 100 mg/dL — ABNORMAL HIGH (ref 70–99)
Potassium: 3.9 mmol/L (ref 3.5–5.1)
Sodium: 131 mmol/L — ABNORMAL LOW (ref 135–145)

## 2019-04-30 LAB — GLUCOSE, CAPILLARY: Glucose-Capillary: 99 mg/dL (ref 70–99)

## 2019-04-30 MED ORDER — ISOSORBIDE MONONITRATE ER 30 MG PO TB24: 15.0000 mg | ORAL_TABLET | Freq: Every day | ORAL | 3 refills | Status: DC

## 2019-04-30 MED ORDER — FUROSEMIDE 40 MG PO TABS
40.0000 mg | ORAL_TABLET | Freq: Every day | ORAL | Status: DC
  Administered 2019-04-30: 40 mg via ORAL
  Filled 2019-04-30: qty 1

## 2019-04-30 MED ORDER — ATORVASTATIN CALCIUM 40 MG PO TABS: 40.0000 mg | ORAL_TABLET | Freq: Every day | ORAL | 3 refills | Status: DC

## 2019-04-30 MED ORDER — AMLODIPINE BESYLATE 5 MG PO TABS: 5.0000 mg | ORAL_TABLET | Freq: Every day | ORAL | 3 refills | Status: DC

## 2019-04-30 MED ORDER — PANTOPRAZOLE SODIUM 40 MG PO TBEC: 40.0000 mg | DELAYED_RELEASE_TABLET | Freq: Every day | ORAL | 0 refills | Status: DC

## 2019-04-30 MED ORDER — FOLIC ACID 1 MG PO TABS: 1.0000 mg | ORAL_TABLET | Freq: Every day | ORAL | 3 refills | Status: DC

## 2019-04-30 MED ORDER — HYDRALAZINE HCL 10 MG PO TABS: 10.0000 mg | ORAL_TABLET | Freq: Three times a day (TID) | ORAL | 3 refills | Status: DC

## 2019-04-30 MED ORDER — FUROSEMIDE 40 MG PO TABS: 40.0000 mg | ORAL_TABLET | ORAL | 0 refills | Status: DC

## 2019-04-30 NOTE — Plan of Care (Signed)
Through conversation w/ pt, he appears to understand that he needs to monitor his BS and BP at home in order to manage it better. Hopefully he will be able to adopt good habits of monitoring his health. Pt had a good appetite this shift.

## 2019-04-30 NOTE — Discharge Summary (Signed)
Physician Discharge Summary  Jose [name] ZOX:[id number] DOB: [birth year] DOA: 04/27/2019  PCP: Margot Ables, MD  Admit date: 04/27/2019 Discharge date: 04/30/2019  Time spent: 45 minutes  Recommendations for Outpatient Follow-up:  1. Note addition of hydralazine, amlodipine, Lasix every other day, Imdur and other medications 2. Discontinue completely lisinopril-please see rationale below 3. Needs Chem-12, magnesium 1 week 4. Needs outpatient counseling tobacco/cocaine/EtOH 5. Please consider diabetic counseling/outpatient monitoring weight loss etc. 6. Might require outpatient screening colonoscopy if not done previously  Discharge Diagnoses:  Principal Problem:   Hyponatremia Active Problems:   Hypertension   Acute renal failure superimposed on stage 3 chronic kidney disease (HCC)   Dyslipidemia associated with type 2 diabetes mellitus (HCC)   CKD (chronic kidney disease) stage 3, GFR 30-59 ml/min (HCC)   COPD (chronic obstructive pulmonary disease) (HCC)   Chest pain   Diarrhea   Nausea and vomiting   Suspected Covid-19 Virus Infection   Elevated troponin   Discharge Condition: Improved  Diet recommendation: Diabetic heart healthy  Filed Weights   04/27/19 0142  Weight: 83 kg    History of present illness:  58 year old African-American male Prior cocaine abuse, chronic ethanolism, polysubstance abuse CAD with 3 stents 1980 PCI to RCA 2010, PCI to LAD 2011 Negative stress test 2017 IDDM type II on insulin HTN HLD CKD 3  Admit 6/3 [place] [facility] Subjective midsternal chest pain + hypertensive urgency/emergency + shortness of breath - radiation + fevers + fatigue + diarrhea X 2 weeks  Cocaine +, sodium 120, K5.5, bicarb 15, AG = 9  Hospital Course:  Coronavirus negative x2-discontinue precautions Acute hyponatremia sodium 120 on admission-chronic metabolic acidosis since 2018 Hyperkalemia on admission Lokelma given good effect--did not need to  repeat the same Chronic non-gap acidosis since 2018 potentially related to diarrhea-Urine anion gap -2 suggestive of diarrhea be implicated above as a cause Would hold ACE inhibitor in the setting His sodium has come up nicely from 120-128--> 130 on discharge Starting Lasix for natruretic effect every other day in outpatient setting Requires labs in 1 week Cocaine +, polysubstance abuse, chronic EtOH Long discussion-he was discussed this in the past with [facility] at [facility] [place], [place] he will continue the same He does not need any help at this time Hypertensive urgency Blood pressure under better control Note changes to blood pressure medications type II demand Troponin trend flat echo benign-appreciate cardiology input Continue aspirin 81, Lipitor 40 for secondary prevention 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  Iron deficiency anemia MCV is low however hemoglobin still is appropriate Outpatient work-up and regular screening with colonoscopy Hyperlipidemia   Discharge Exam: Vitals:   04/30/19 0459 04/30/19 0812  BP: (!) 163/86   Pulse: (!) 55   Resp: 16   Temp: 98.4 F (36.9 C)   SpO2: 96% 97%    General: Awake alert pleasant no distress EOMI NCAT poor sleep last night Cardiovascular: S1-S2 no murmur rub or gallop Respiratory: Clinically clear no added sound Abdomen soft nontender No guarding Neurologically intact  Discharge Instructions    Allergies as of 04/30/2019      Reactions   Lisinopril-hydrochlorothiazide Swelling   Causes swelling of lips   Simvastatin Other (See Comments)   Reported by [facility] 04/10/16 - unknown reaction      Medication List    STOP taking these medications  Fish Oil 1000 MG Caps   lisinopril 10 MG tablet Commonly known as:  ZESTRIL     TAKE  these medications   acetaminophen 325 MG tablet Commonly known as:  TYLENOL Take 325 mg by mouth every 6 (six) hours as needed for headache (pain).   albuterol 108 (90 Base) MCG/ACT inhaler Commonly known as:  VENTOLIN HFA Inhale 2 puffs into the lungs every 6 (six) hours as needed for wheezing or shortness of breath.   amLODipine 5 MG tablet Commonly known as:  NORVASC Take 1 tablet (5 mg total) by mouth daily. Start taking on:  May 01, 2019   aspirin EC 81 MG tablet Take 81 mg by mouth daily.   atorvastatin 40 MG tablet Commonly known as:  LIPITOR Take 1 tablet (40 mg total) by mouth daily at 6 PM.   brimonidine 0.2 % ophthalmic solution Commonly known as:  ALPHAGAN Place 1 drop into both eyes 2 (two) times daily.   budesonide-formoterol 160-4.5 MCG/ACT inhaler Commonly known as:  SYMBICORT Inhale 2 puffs into the lungs 2 (two) times daily.   doxazosin 8 MG tablet Commonly known as:  CARDURA Take 4 mg by mouth at bedtime.   ferrous sulfate 325 (65 FE) MG tablet Take 325 mg by mouth daily with breakfast.   folic acid 1 MG tablet Commonly known as:  FOLVITE Take 1 tablet (1 mg total) by mouth daily.   furosemide 40 MG tablet Commonly known as:  LASIX Take 1 tablet (40 mg total) by mouth every other day.   glucose 4 GM chewable tablet Chew 4 g by mouth daily as needed for low blood sugar.   hydrALAZINE 10 MG tablet Commonly known as:  APRESOLINE Take 1 tablet (10 mg total) by mouth every 8 (eight) hours.   insulin aspart 100 UNIT/ML injection Commonly known as:  novoLOG Inject 0-9 Units into the skin 3 (three) times daily with meals.   insulin glargine 100 UNIT/ML injection Commonly known as:  LANTUS Inject 0.3 mLs (30 Units total) into the skin daily before breakfast.   isosorbide mononitrate 30 MG 24 hr tablet Commonly known as:  IMDUR Take 0.5 tablets (15 mg total) by mouth daily. Start taking on:  May 01, 2019   latanoprost 0.005 % ophthalmic  solution Commonly known as:  XALATAN Place 1 drop into both eyes at bedtime.   pantoprazole 40 MG tablet Commonly known as:  PROTONIX Take 1 tablet (40 mg total) by mouth daily at 6 (six) AM. Start taking on:  May 01, 2019   traZODone 150 MG tablet Commonly known as:  DESYREL Take 150 mg by mouth at bedtime.   VITAMIN B-12 PO Take 1 tablet by mouth daily.      Allergies  Allergen Reactions  . Lisinopril-Hydrochlorothiazide Swelling    Causes swelling of lips  . Simvastatin Other (See Comments)    Rep[facility] 04/10/16 - unknown reaction      The results of significant diagnostics from this hospitalization (including imaging, microbiology, ancillary and laboratory) are listed below for reference.    Significant Diagnostic Studies: Ct Abdomen Pelvis Wo Contrast  Result Date: 04/27/2019 CLINICAL DATA:  Acute on chronic hyponatremia with nausea and vomiting EXAM: CT ABDOMEN AND PELVIS WITHOUT CONTRAST TECHNIQUE: Multidetector CT imaging of the abdomen and pelvis was performed following the standard protocol without IV contrast. COMPARISON:  None. FINDINGS: Lower chest: No acute abnormality. Hepatobiliary: No focal liver abnormality is seen. No gallstones, gallbladder wall  thickening, or biliary dilatation. Pancreas: Unremarkable. No pancreatic ductal dilatation or surrounding inflammatory changes. Spleen: Normal in size without focal abnormality. Adrenals/Urinary Tract: Adrenal glands are within normal limits bilaterally. 5 mm nonobstructing right renal stone is noted. No ureteral stones are seen. The bladder is decompressed. Stomach/Bowel: Stomach is decompressed. Small bowel dilatation is identified throughout the jejunum and ileum. The distal ileum is within normal limits. The appendix is unremarkable. The colon is air filled with mild fluid within without definitive obstructive change. A true transition zone is not well appreciated. These changes may represent  a diffuse small bowel ileus. Correlation with the physical exam is recommended. Vascular/Lymphatic: Aortic atherosclerosis. No enlarged abdominal or pelvic lymph nodes. Reproductive: Prostate is unremarkable. Other: No abdominal wall hernia or abnormality. No abdominopelvic ascites. Musculoskeletal: No acute or significant osseous findings. IMPRESSION: Diffuse small small-bowel dilatation with the exception of the most distal aspect of the ileum. This may represent a diffuse small bowel ileus. Correlation with the physical exam is recommended. An actual mass or other focal abnormality causing a transition zone is not well appreciated on today's exam. 5 mm nonobstructing right renal stone. Electronically Signed   By: Alcide CleverMark  Lukens M.D.   On: 04/27/2019 13:04   Dg Chest Port 1 View  Result Date: 04/27/2019 CLINICAL DATA:  Flu like symptoms EXAM: PORTABLE CHEST 1 VIEW COMPARISON:  Chest x-ray dated 08/28/2017 FINDINGS: The heart size and mediastinal contours are within normal limits. Both lungs are clear. The visualized skeletal structures are unremarkable. IMPRESSION: No active disease. Electronically Signed   By: Katherine Mantlehristopher  Green M.D.   On: 04/27/2019 02:58    Microbiology: Recent Results (from the past 240 hour(s))  SARS Coronavirus 2 (CEPHEID- Performed in [facility] hospital lab), Hosp Order     Status: None   Collection Time: 04/27/19  3:56 AM  Result Value Ref Range Status   SARS Coronavirus 2 NEGATIVE NEGATIVE Final    Comment: (NOTE) If result is NEGATIVE SARS-CoV-2 target nucleic acids are NOT DETECTED. The SARS-CoV-2 RNA is generally detectable in upper and lower  respiratory specimens during the acute phase of infection. The lowest  concentration of SARS-CoV-2 viral copies this assay can detect is 250  copies / mL. A negative result does not preclude SARS-CoV-2 infection  and should not be used as the sole basis for treatment or other  patient management decisions.  A negative result  may occur with  improper specimen collection / handling, submission of specimen other  than nasopharyngeal swab, presence of viral mutation(s) within the  areas targeted by this assay, and inadequate number of viral copies  (<250 copies / mL). A negative result must be combined with clinical  observations, patient history, and epidemiological information. If result is POSITIVE SARS-CoV-2 target nucleic acids are DETECTED. The SARS-CoV-2 RNA is generally detectable in upper and lower  respiratory specimens dur ing the acute phase of infection.  Positive  results are indicative of active infection with SARS-CoV-2.  Clinical  correlation with patient history and other diagnostic information is  necessary to determine patient infection status.  Positive results do  not rule out bacterial infection or co-infection with other viruses. If result is PRESUMPTIVE POSTIVE SARS-CoV-2 nucleic acids MAY BE PRESENT.   A presumptive positive result was obtained on the submitted specimen  and confirmed on repeat testing.  While 2019 novel coronavirus  (SARS-CoV-2) nucleic acids may be present in the submitted sample  additional confirmatory testing may be necessary for epidemiological  and /  or clinical management purposes  to differentiate between  SARS-CoV-2 and other Sarbecovirus currently known to infect humans.  If clinically indicated additional testing with an alternate test  methodology [phone]) is advised. The SARS-CoV-2 RNA is generally  detectable in upper and lower respiratory sp ecimens during the acute  phase of infection. The expected result is Negative. Fact Sheet for Patients:  StrictlyIdeas.no Fact Sheet for Healthcare Providers: BankingDealers.co.za This test is not yet approved or cleared by the [place] FDA and has been authorized for detection and/or diagnosis of SARS-CoV-2 by FDA under an Emergency Use Authorization (EUA).   This EUA will remain in effect (meaning this test can be used) for the duration of the COVID-19 declaration under Section 564(b)(1) of the Act, 21 U.S.C. section 360bbb-3(b)(1), unless the authorization is terminated or revoked sooner. Performed at [facility], [place] [address] [place], [place] [address]   MRSA PCR Screening     Status: None   Collection Time: 04/27/19  9:40 AM  Result Value Ref Range Status   MRSA by PCR NEGATIVE NEGATIVE Final    Comment:        The GeneXpert MRSA Assay (FDA approved for NASAL specimens only), is one component of a comprehensive MRSA colonization surveillance program. It is not intended to diagnose MRSA infection nor to guide or monitor treatment for MRSA infections. Performed at [facility], [place] [address] [place], [place] [address]   SARS Coronavirus 2 (CEPHEID - Performed in [place] hospital lab), Hosp Order     Status: None   Collection Time: 04/28/19  9:29 AM  Result Value Ref Range Status   SARS Coronavirus 2 NEGATIVE NEGATIVE Final    Comment: (NOTE) If result is NEGATIVE SARS-CoV-2 target nucleic acids are NOT DETECTED. The SARS-CoV-2 RNA is generally detectable in upper and lower  respiratory specimens during the acute phase of infection. The lowest  concentration of SARS-CoV-2 viral copies this assay can detect is 250  copies / mL. A negative result does not preclude SARS-CoV-2 infection  and should not be used as the sole basis for treatment or other  patient management decisions.  A negative result may occur with  improper specimen collection / handling, submission of specimen other  than nasopharyngeal swab, presence of viral mutation(s) within the  areas targeted by this assay, and inadequate number of viral copies  (<250 copies / mL). A negative result must be combined with clinical  observations, patient history, and epidemiological information. If result is POSITIVE SARS-CoV-2  target nucleic acids are DETECTED. The SARS-CoV-2 RNA is generally detectable in upper and lower  respiratory specimens dur ing the acute phase of infection.  Positive  results are indicative of active infection with SARS-CoV-2.  Clinical  correlation with patient history and other diagnostic information is  necessary to determine patient infection status.  Positive results do  not rule out bacterial infection or co-infection with other viruses. If result is PRESUMPTIVE POSTIVE SARS-CoV-2 nucleic acids MAY BE PRESENT.   A presumptive positive result was obtained on the submitted specimen  and confirmed on repeat testing.  While 2019 novel coronavirus  (SARS-CoV-2) nucleic acids may be present in the submitted sample  additional confirmatory testing may be necessary for epidemiological  and / or clinical management purposes  to differentiate between  SARS-CoV-2 and other Sarbecovirus currently known to infect humans.  If clinically indicated additional testing with an alternate test  methodology [phone]) is advised. The SARS-CoV-2 RNA is generally  detectable in upper and lower respiratory sp ecimens during the acute  phase of infection. The expected result is Negative. Fact Sheet for Patients:  BoilerBrush.com.cyhttps://www.fda.gov/media/136312/download Fact Sheet for Healthcare Providers: https://pope.com/https://www.fda.gov/media/136313/download This test is not yet approved or cleared by the [place] FDA and has been authorized for detection and/or diagnosis of SARS-CoV-2 by FDA under an Emergency Use Authorization (EUA).  This EUA will remain in effect (meaning this test can be used) for the duration of the COVID-19 declaration under Section 564(b)(1) of the Act, 21 U.S.C. section 360bbb-3(b)(1), unless the authorization is terminated or revoked sooner. Performed at [facility], [address] [place], [place] [address]      Labs: Basic Metabolic Panel: Recent Labs  Lab  04/27/19 0601 04/27/19 1348 04/28/19 0320 04/29/19 0643 04/30/19 0620  NA 120* 122* 126* 128* 131*  K 5.5* 4.4 3.9 4.1 3.9  CL 96* 97* 106 105 105  CO2 15* 16* 14* 15* 18*  GLUCOSE 177* 186* 85 135* 100*  BUN 23* 23* 21* 25* 24*  CREATININE 1.59* 1.60*  1.62* 1.40* 1.47* 1.41*  CALCIUM 9.3 8.6* 8.3* 9.0 9.1   Liver Function Tests: Recent Labs  Lab 04/27/19 0601  AST 46*  ALT 28  ALKPHOS 66  BILITOT 1.1  PROT 8.5*  ALBUMIN 5.0   Recent Labs  Lab 04/27/19 1348  LIPASE 23   No results for input(s): AMMONIA in the last 168 hours. CBC: Recent Labs  Lab 04/27/19 0601 04/27/19 1348 04/28/19 0320 04/29/19 0643 04/30/19 0620  WBC 7.8 7.6 5.3 4.9 6.1  NEUTROABS 3.9 4.3  --  2.5 3.0  HGB 16.1 16.0 14.3 13.6 13.3  HCT 44.9 43.4 40.6 38.4* 36.0*  MCV 78.4* 78.6* 79.0* 79.3* 78.4*  PLT 166 205 191 184 175   Cardiac Enzymes: Recent Labs  Lab 04/27/19 0601 04/27/19 1348 04/27/19 2101  TROPONINI 0.06* 0.05* 0.04*   BNP: BNP (last 3 results) No results for input(s): BNP in the last 8760 hours.  ProBNP (last 3 results) No results for input(s): PROBNP in the last 8760 hours.  CBG: Recent Labs  Lab 04/29/19 0730 04/29/19 1138 04/29/19 1635 04/29/19 2122 04/30/19 0718  GLUCAP 116* 189* 174* 224* 99       Signed:  Rhetta MuraJai-Gurmukh Nalini Alcaraz MD   [facility] 04/30/2019, 10:15 AM

## 2019-04-30 NOTE — Progress Notes (Signed)
Progress Note  Patient Name: Jose [name] Date of Encounter: 04/30/2019  Primary Cardiologist: No primary care provider on file.  [facility]  Subjective   Doing better.  No chest pain.  He states he really wants to quit cocaine.  He was using it when he was feeling more depressed, during COVID-19 pandemic.  Inpatient Medications    Scheduled Meds: . amLODipine  5 mg Oral Daily  . aspirin EC  81 mg Oral Daily  . atorvastatin  40 mg Oral q1800  . brimonidine  1 drop Both Eyes BID  . Chlorhexidine Gluconate Cloth  6 each Topical Daily  . doxazosin  4 mg Oral QHS  . enoxaparin (LOVENOX) injection  40 mg Subcutaneous Q24H  . folic acid  1 mg Oral Daily  . furosemide  40 mg Oral Daily  . hydrALAZINE  10 mg Oral Q8H  . insulin glargine  20 Units Subcutaneous Daily  . isosorbide mononitrate  15 mg Oral Daily  . latanoprost  1 drop Both Eyes QHS  . mometasone-formoterol  2 puff Inhalation BID  . pantoprazole  40 mg Oral Q0600   Continuous Infusions:  PRN Meds: acetaminophen **OR** acetaminophen, hydrALAZINE, HYDROcodone-acetaminophen, iohexol, ipratropium-albuterol, nitroGLYCERIN, ondansetron (ZOFRAN) IV   Vital Signs    Vitals:   04/29/19 2022 04/29/19 2124 04/30/19 0459 04/30/19 0812  BP:  (!) 163/78 (!) 163/86   Pulse:  (!) 55 (!) 55   Resp:  16 16   Temp:  98.6 F (37 C) 98.4 F (36.9 C)   TempSrc:  Oral Oral   SpO2: 98% 96% 96% 97%  Weight:      Height:        Intake/Output Summary (Last 24 hours) at 04/30/2019 1013 Last data filed at 04/30/2019 0900 Gross per 24 hour  Intake 1925.38 ml  Output 2250 ml  Net -324.62 ml   Last 3 Weights 04/27/2019 08/29/2017 08/28/2017  Weight (lbs) 183 lb 170 lb 6.4 oz 172 lb 6.4 oz  Weight (kg) 83.008 kg 77.293 kg 78.2 kg      Telemetry    Sinus rhythm, occasional PVCs heart rate in the 70s- Personally Reviewed  ECG    No new- Personally Reviewed  Physical Exam   GEN: No acute distress.   Neck: No JVD Cardiac: RRR, no  murmurs, rubs, or gallops.  Respiratory: Clear to auscultation bilaterally. GI: Soft, nontender, non-distended  MS: No edema; No deformity. Neuro:  Nonfocal  Psych: Normal affect   Labs    Chemistry Recent Labs  Lab 04/27/19 0601  04/28/19 0320 04/29/19 [id number] 04/30/19 0620  NA 120*   < > 126* 128* 131*  K 5.5*   < > 3.9 4.1 3.9  CL 96*   < > 106 105 105  CO2 15*   < > 14* 15* 18*  GLUCOSE 177*   < > 85 135* 100*  BUN 23*   < > 21* 25* 24*  CREATININE 1.59*   < > 1.40* 1.47* 1.41*  CALCIUM 9.3   < > 8.3* 9.0 9.1  PROT 8.5*  --   --   --   --   ALBUMIN 5.0  --   --   --   --   AST 46*  --   --   --   --   ALT 28  --   --   --   --   ALKPHOS 66  --   --   --   --  BILITOT 1.1  --   --   --   --   GFRNONAA 46*   < > 54* 51* 54*  GFRAA 54*   < > >60 59* >60  ANIONGAP 9   < > 6 8 8    < > = values in this interval not displayed.     Hematology Recent Labs  Lab 04/28/19 0320 04/29/19 0643 04/30/19 0620  WBC 5.3 4.9 6.1  RBC 5.14 4.84 4.59  HGB 14.3 13.6 13.3  HCT 40.6 38.4* 36.0*  MCV 79.0* 79.3* 78.4*  MCH 27.8 28.1 29.0  MCHC 35.2 35.4 36.9*  RDW 13.3 13.5 13.6  PLT 191 184 175    Cardiac Enzymes Recent Labs  Lab 04/27/19 0601 04/27/19 1348 04/27/19 2101  TROPONINI 0.06* 0.05* 0.04*   No results for input(s): TROPIPOC in the last 168 hours.   BNPNo results for input(s): BNP, PROBNP in the last 168 hours.   DDimer  Recent Labs  Lab 04/27/19 1348  DDIMER 0.55*     Radiology    No results found.  Cardiac Studies   EF normal  Patient Profile     58 y.o. male with essential hypertension, coronary artery disease, flat low level troponin elevation.  Assessment & Plan    Elevated troponin/chest pain -Flat, low level, likely related to elevated blood pressure.  No further ischemic evaluation.  Although this does not represent acute coronary syndrome, and elevated troponin does portend a worsened overall prognosis.  Coronary artery disease-PCI  RCA and LAD approximately 10 years ago. - Nonobstructive disease noted on cath in 2013.  Secondary risk factor prevention.  Cocaine use - U tox positive for cocaine.  Continued use of cocaine can result in death from a myocardial perspective.  No further cardiac recommendations at this time.  We will sign off.  Recommend following up with his cardiologist at [facility] as Dr. Debara Pickett suggested.      For questions or updates, please contact [place] Please consult [email or web address] for contact info under        Signed, Candee Furbish, MD  04/30/2019, 10:13 AM

## 2019-05-13 ENCOUNTER — Emergency Department (HOSPITAL_COMMUNITY): Payer: Medicare HMO

## 2019-05-13 ENCOUNTER — Inpatient Hospital Stay (HOSPITAL_COMMUNITY)
Admission: EM | Admit: 2019-05-13 | Discharge: 2019-05-15 | DRG: 313 | Disposition: A | Discharge status: Home / Self Care | Payer: Medicare HMO | Attending: Internal Medicine | Admitting: Internal Medicine

## 2019-05-13 ENCOUNTER — Encounter (HOSPITAL_COMMUNITY): Payer: Self-pay

## 2019-05-13 ENCOUNTER — Other Ambulatory Visit: Payer: Self-pay

## 2019-05-13 DIAGNOSIS — F1020 Alcohol dependence, uncomplicated: Secondary | ICD-10-CM | POA: Diagnosis present

## 2019-05-13 DIAGNOSIS — Z794 Long term (current) use of insulin: Secondary | ICD-10-CM

## 2019-05-13 DIAGNOSIS — Z7982 Long term (current) use of aspirin: Secondary | ICD-10-CM | POA: Diagnosis not present

## 2019-05-13 DIAGNOSIS — Z79891 Long term (current) use of opiate analgesic: Secondary | ICD-10-CM

## 2019-05-13 DIAGNOSIS — N289 Disorder of kidney and ureter, unspecified: Secondary | ICD-10-CM | POA: Diagnosis not present

## 2019-05-13 DIAGNOSIS — R0789 Other chest pain: Secondary | ICD-10-CM | POA: Diagnosis not present

## 2019-05-13 DIAGNOSIS — Z87891 Personal history of nicotine dependence: Secondary | ICD-10-CM | POA: Diagnosis not present

## 2019-05-13 DIAGNOSIS — E871 Hypo-osmolality and hyponatremia: Secondary | ICD-10-CM | POA: Diagnosis not present

## 2019-05-13 DIAGNOSIS — N1832 Chronic kidney disease, stage 3b: Secondary | ICD-10-CM | POA: Diagnosis present

## 2019-05-13 DIAGNOSIS — I129 Hypertensive chronic kidney disease with stage 1 through stage 4 chronic kidney disease, or unspecified chronic kidney disease: Secondary | ICD-10-CM | POA: Diagnosis not present

## 2019-05-13 DIAGNOSIS — I2510 Atherosclerotic heart disease of native coronary artery without angina pectoris: Secondary | ICD-10-CM | POA: Diagnosis present

## 2019-05-13 DIAGNOSIS — R079 Chest pain, unspecified: Secondary | ICD-10-CM | POA: Diagnosis not present

## 2019-05-13 DIAGNOSIS — F1410 Cocaine abuse, uncomplicated: Secondary | ICD-10-CM | POA: Diagnosis not present

## 2019-05-13 DIAGNOSIS — J449 Chronic obstructive pulmonary disease, unspecified: Secondary | ICD-10-CM | POA: Diagnosis not present

## 2019-05-13 DIAGNOSIS — Z20828 Contact with and (suspected) exposure to other viral communicable diseases: Secondary | ICD-10-CM | POA: Diagnosis present

## 2019-05-13 DIAGNOSIS — I10 Essential (primary) hypertension: Secondary | ICD-10-CM | POA: Diagnosis not present

## 2019-05-13 DIAGNOSIS — F329 Major depressive disorder, single episode, unspecified: Secondary | ICD-10-CM | POA: Diagnosis present

## 2019-05-13 DIAGNOSIS — I252 Old myocardial infarction: Secondary | ICD-10-CM

## 2019-05-13 DIAGNOSIS — F1490 Cocaine use, unspecified, uncomplicated: Secondary | ICD-10-CM | POA: Diagnosis not present

## 2019-05-13 DIAGNOSIS — E875 Hyperkalemia: Secondary | ICD-10-CM | POA: Diagnosis not present

## 2019-05-13 DIAGNOSIS — Z955 Presence of coronary angioplasty implant and graft: Secondary | ICD-10-CM | POA: Diagnosis not present

## 2019-05-13 DIAGNOSIS — E118 Type 2 diabetes mellitus with unspecified complications: Secondary | ICD-10-CM

## 2019-05-13 DIAGNOSIS — R7989 Other specified abnormal findings of blood chemistry: Secondary | ICD-10-CM | POA: Diagnosis present

## 2019-05-13 DIAGNOSIS — E785 Hyperlipidemia, unspecified: Secondary | ICD-10-CM | POA: Diagnosis not present

## 2019-05-13 DIAGNOSIS — I249 Acute ischemic heart disease, unspecified: Secondary | ICD-10-CM

## 2019-05-13 DIAGNOSIS — Z888 Allergy status to other drugs, medicaments and biological substances status: Secondary | ICD-10-CM

## 2019-05-13 DIAGNOSIS — N189 Chronic kidney disease, unspecified: Secondary | ICD-10-CM

## 2019-05-13 DIAGNOSIS — E1165 Type 2 diabetes mellitus with hyperglycemia: Secondary | ICD-10-CM | POA: Diagnosis not present

## 2019-05-13 DIAGNOSIS — N183 Chronic kidney disease, stage 3 unspecified: Secondary | ICD-10-CM | POA: Diagnosis present

## 2019-05-13 DIAGNOSIS — Z79899 Other long term (current) drug therapy: Secondary | ICD-10-CM | POA: Diagnosis not present

## 2019-05-13 DIAGNOSIS — F1290 Cannabis use, unspecified, uncomplicated: Secondary | ICD-10-CM | POA: Diagnosis present

## 2019-05-13 DIAGNOSIS — Z7951 Long term (current) use of inhaled steroids: Secondary | ICD-10-CM

## 2019-05-13 DIAGNOSIS — J410 Simple chronic bronchitis: Secondary | ICD-10-CM | POA: Diagnosis not present

## 2019-05-13 DIAGNOSIS — E1121 Type 2 diabetes mellitus with diabetic nephropathy: Secondary | ICD-10-CM

## 2019-05-13 DIAGNOSIS — K219 Gastro-esophageal reflux disease without esophagitis: Secondary | ICD-10-CM | POA: Diagnosis present

## 2019-05-13 DIAGNOSIS — E119 Type 2 diabetes mellitus without complications: Secondary | ICD-10-CM

## 2019-05-13 DIAGNOSIS — Z8249 Family history of ischemic heart disease and other diseases of the circulatory system: Secondary | ICD-10-CM

## 2019-05-13 DIAGNOSIS — N179 Acute kidney failure, unspecified: Secondary | ICD-10-CM | POA: Diagnosis not present

## 2019-05-13 DIAGNOSIS — E1159 Type 2 diabetes mellitus with other circulatory complications: Secondary | ICD-10-CM | POA: Diagnosis present

## 2019-05-13 DIAGNOSIS — Z9119 Patient's noncompliance with other medical treatment and regimen: Secondary | ICD-10-CM

## 2019-05-13 LAB — GLUCOSE, CAPILLARY
Glucose-Capillary: 373 mg/dL — ABNORMAL HIGH (ref 70–99)
Glucose-Capillary: 97 mg/dL (ref 70–99)

## 2019-05-13 LAB — BASIC METABOLIC PANEL
Anion gap: 9 (ref 5–15)
BUN: 18 mg/dL (ref 6–20)
CO2: 21 mmol/L — ABNORMAL LOW (ref 22–32)
Calcium: 9.3 mg/dL (ref 8.9–10.3)
Chloride: 94 mmol/L — ABNORMAL LOW (ref 98–111)
Creatinine, Ser: 1.5 mg/dL — ABNORMAL HIGH (ref 0.61–1.24)
GFR calc Af Amer: 58 mL/min — ABNORMAL LOW (ref >60)
GFR calc non Af Amer: 50 mL/min — ABNORMAL LOW (ref >60)
Glucose, Bld: 230 mg/dL — ABNORMAL HIGH (ref 70–99)
Potassium: 4.2 mmol/L (ref 3.5–5.1)
Sodium: 124 mmol/L — ABNORMAL LOW (ref 135–145)

## 2019-05-13 LAB — TSH: TSH: 1.349 u[IU]/mL (ref 0.350–4.500)

## 2019-05-13 LAB — CBC
HCT: 36.2 % — ABNORMAL LOW (ref 39.0–52.0)
Hemoglobin: 13.4 g/dL (ref 13.0–17.0)
MCH: 28.2 pg (ref 26.0–34.0)
MCHC: 37 g/dL — ABNORMAL HIGH (ref 30.0–36.0)
MCV: 76.2 fL — ABNORMAL LOW (ref 80.0–100.0)
Platelets: 165 10*3/uL (ref 150–400)
RBC: 4.75 MIL/uL (ref 4.22–5.81)
RDW: 13 % (ref 11.5–15.5)
WBC: 5.6 10*3/uL (ref 4.0–10.5)
nRBC: 0 % (ref 0.0–0.2)

## 2019-05-13 LAB — TROPONIN I
Troponin I: 0.04 ng/mL (ref <0.03)
Troponin I: 0.04 ng/mL (ref <0.03)
Troponin I: 0.04 ng/mL (ref <0.03)

## 2019-05-13 LAB — RAPID URINE DRUG SCREEN, HOSP PERFORMED
Amphetamines: NOT DETECTED
Barbiturates: NOT DETECTED
Benzodiazepines: NOT DETECTED
Cocaine: POSITIVE — AB
Opiates: NOT DETECTED
Tetrahydrocannabinol: POSITIVE — AB

## 2019-05-13 MED ORDER — AMLODIPINE BESYLATE 2.5 MG PO TABS
5.0000 mg | ORAL_TABLET | Freq: Every day | ORAL | Status: DC
  Administered 2019-05-13 – 2019-05-15 (×3): 5 mg via ORAL
  Filled 2019-05-13 (×3): qty 2

## 2019-05-13 MED ORDER — ONDANSETRON HCL 4 MG/2ML IJ SOLN: 4.0000 mg | Freq: Four times a day (QID) | INTRAMUSCULAR | Status: DC | PRN

## 2019-05-13 MED ORDER — ASPIRIN EC 81 MG PO TBEC
81.0000 mg | DELAYED_RELEASE_TABLET | Freq: Every day | ORAL | Status: DC
  Administered 2019-05-14 – 2019-05-15 (×2): 81 mg via ORAL
  Filled 2019-05-13 (×3): qty 1

## 2019-05-13 MED ORDER — SODIUM CHLORIDE 0.9% FLUSH: 3.0000 mL | Freq: Once | INTRAVENOUS | Status: DC

## 2019-05-13 MED ORDER — INSULIN ASPART 100 UNIT/ML ~~LOC~~ SOLN
0.0000 [IU] | Freq: Three times a day (TID) | SUBCUTANEOUS | Status: DC
  Administered 2019-05-13: 9 [IU] via SUBCUTANEOUS
  Administered 2019-05-14: 3 [IU] via SUBCUTANEOUS
  Administered 2019-05-14: 2 [IU] via SUBCUTANEOUS
  Administered 2019-05-14 – 2019-05-15 (×2): 3 [IU] via SUBCUTANEOUS
  Administered 2019-05-15: 1 [IU] via SUBCUTANEOUS

## 2019-05-13 MED ORDER — VITAMIN B-1 100 MG PO TABS
100.0000 mg | ORAL_TABLET | Freq: Every day | ORAL | Status: DC
  Administered 2019-05-13 – 2019-05-15 (×3): 100 mg via ORAL
  Filled 2019-05-13 (×3): qty 1

## 2019-05-13 MED ORDER — ONDANSETRON HCL 4 MG PO TABS: 4.0000 mg | ORAL_TABLET | Freq: Four times a day (QID) | ORAL | Status: DC | PRN

## 2019-05-13 MED ORDER — INSULIN ASPART 100 UNIT/ML ~~LOC~~ SOLN
0.0000 [IU] | Freq: Every day | SUBCUTANEOUS | Status: DC
  Administered 2019-05-14: 2 [IU] via SUBCUTANEOUS

## 2019-05-13 MED ORDER — ADULT MULTIVITAMIN W/MINERALS CH
1.0000 | ORAL_TABLET | Freq: Every day | ORAL | Status: DC
  Administered 2019-05-13 – 2019-05-15 (×3): 1 via ORAL
  Filled 2019-05-13 (×3): qty 1

## 2019-05-13 MED ORDER — LATANOPROST 0.005 % OP SOLN
1.0000 [drp] | Freq: Every day | OPHTHALMIC | Status: DC
  Administered 2019-05-13 – 2019-05-14 (×2): 1 [drp] via OPHTHALMIC
  Filled 2019-05-13: qty 2.5

## 2019-05-13 MED ORDER — MOMETASONE FURO-FORMOTEROL FUM 200-5 MCG/ACT IN AERO
2.0000 | INHALATION_SPRAY | Freq: Two times a day (BID) | RESPIRATORY_TRACT | Status: DC
  Administered 2019-05-13 – 2019-05-15 (×4): 2 via RESPIRATORY_TRACT
  Filled 2019-05-13: qty 8.8

## 2019-05-13 MED ORDER — TRAZODONE HCL 150 MG PO TABS
150.0000 mg | ORAL_TABLET | Freq: Every day | ORAL | Status: DC
  Administered 2019-05-13 – 2019-05-14 (×2): 150 mg via ORAL
  Filled 2019-05-13 (×2): qty 1

## 2019-05-13 MED ORDER — DOXAZOSIN MESYLATE 8 MG PO TABS
4.0000 mg | ORAL_TABLET | Freq: Every day | ORAL | Status: DC
  Administered 2019-05-13 – 2019-05-14 (×2): 4 mg via ORAL
  Filled 2019-05-13 (×2): qty 1

## 2019-05-13 MED ORDER — PANTOPRAZOLE SODIUM 40 MG PO TBEC
40.0000 mg | DELAYED_RELEASE_TABLET | Freq: Every day | ORAL | Status: DC
  Administered 2019-05-14 – 2019-05-15 (×2): 40 mg via ORAL
  Filled 2019-05-13 (×3): qty 1

## 2019-05-13 MED ORDER — BRIMONIDINE TARTRATE 0.2 % OP SOLN
1.0000 [drp] | Freq: Two times a day (BID) | OPHTHALMIC | Status: DC
  Administered 2019-05-13 – 2019-05-15 (×4): 1 [drp] via OPHTHALMIC
  Filled 2019-05-13: qty 5

## 2019-05-13 MED ORDER — FOLIC ACID 1 MG PO TABS
1.0000 mg | ORAL_TABLET | Freq: Every day | ORAL | Status: DC
  Administered 2019-05-13 – 2019-05-15 (×3): 1 mg via ORAL
  Filled 2019-05-13 (×3): qty 1

## 2019-05-13 MED ORDER — FERROUS SULFATE 325 (65 FE) MG PO TABS: 325.0000 mg | ORAL_TABLET | Freq: Every day | ORAL | Status: DC

## 2019-05-13 MED ORDER — SODIUM CHLORIDE 0.9 % IV SOLN
INTRAVENOUS | Status: DC
  Administered 2019-05-13 – 2019-05-15 (×3): via INTRAVENOUS

## 2019-05-13 MED ORDER — LORAZEPAM 1 MG PO TABS
1.0000 mg | ORAL_TABLET | Freq: Four times a day (QID) | ORAL | Status: DC | PRN
  Administered 2019-05-13 – 2019-05-14 (×2): 1 mg via ORAL
  Filled 2019-05-13 (×3): qty 1

## 2019-05-13 MED ORDER — ACETAMINOPHEN 325 MG PO TABS
325.0000 mg | ORAL_TABLET | Freq: Four times a day (QID) | ORAL | Status: DC | PRN
  Administered 2019-05-13 – 2019-05-15 (×4): 325 mg via ORAL
  Filled 2019-05-13 (×4): qty 1

## 2019-05-13 MED ORDER — HYDRALAZINE HCL 10 MG PO TABS
10.0000 mg | ORAL_TABLET | Freq: Three times a day (TID) | ORAL | Status: DC
  Administered 2019-05-13 – 2019-05-15 (×6): 10 mg via ORAL
  Filled 2019-05-13 (×6): qty 1

## 2019-05-13 MED ORDER — SODIUM CHLORIDE 0.9 % IV BOLUS
1000.0000 mL | Freq: Once | INTRAVENOUS | Status: AC
  Administered 2019-05-13: 1000 mL via INTRAVENOUS

## 2019-05-13 MED ORDER — ALBUTEROL SULFATE HFA 108 (90 BASE) MCG/ACT IN AERS: 2.0000 | INHALATION_SPRAY | Freq: Four times a day (QID) | RESPIRATORY_TRACT | Status: DC | PRN

## 2019-05-13 MED ORDER — ATORVASTATIN CALCIUM 40 MG PO TABS
40.0000 mg | ORAL_TABLET | Freq: Every day | ORAL | Status: DC
  Administered 2019-05-13 – 2019-05-14 (×2): 40 mg via ORAL
  Filled 2019-05-13 (×2): qty 1

## 2019-05-13 MED ORDER — ENOXAPARIN SODIUM 40 MG/0.4ML ~~LOC~~ SOLN
40.0000 mg | SUBCUTANEOUS | Status: DC
  Administered 2019-05-13 – 2019-05-14 (×2): 40 mg via SUBCUTANEOUS
  Filled 2019-05-13 (×2): qty 0.4

## 2019-05-13 MED ORDER — ALBUTEROL SULFATE (2.5 MG/3ML) 0.083% IN NEBU
2.5000 mg | INHALATION_SOLUTION | RESPIRATORY_TRACT | Status: DC | PRN
  Filled 2019-05-13: qty 3

## 2019-05-13 MED ORDER — FUROSEMIDE 40 MG PO TABS
40.0000 mg | ORAL_TABLET | ORAL | Status: DC
  Administered 2019-05-13 – 2019-05-15 (×2): 40 mg via ORAL
  Filled 2019-05-13 (×2): qty 1

## 2019-05-13 MED ORDER — LORAZEPAM 2 MG/ML IJ SOLN: 1.0000 mg | Freq: Four times a day (QID) | INTRAMUSCULAR | Status: DC | PRN

## 2019-05-13 MED ORDER — INSULIN GLARGINE 100 UNIT/ML ~~LOC~~ SOLN
30.0000 [IU] | Freq: Every day | SUBCUTANEOUS | Status: DC
  Filled 2019-05-13: qty 0.3

## 2019-05-13 MED ORDER — ISOSORBIDE MONONITRATE ER 30 MG PO TB24
15.0000 mg | ORAL_TABLET | Freq: Every day | ORAL | Status: DC
  Administered 2019-05-13 – 2019-05-15 (×3): 15 mg via ORAL
  Filled 2019-05-13 (×3): qty 1

## 2019-05-13 MED ORDER — THIAMINE HCL 100 MG/ML IJ SOLN: 100.0000 mg | Freq: Every day | INTRAMUSCULAR | Status: DC

## 2019-05-13 MED ORDER — FERROUS SULFATE 325 (65 FE) MG PO TABS
325.0000 mg | ORAL_TABLET | Freq: Every day | ORAL | Status: DC
  Administered 2019-05-14 – 2019-05-15 (×2): 325 mg via ORAL
  Filled 2019-05-13 (×2): qty 1

## 2019-05-13 NOTE — ED Notes (Signed)
Patient returned from xray.

## 2019-05-13 NOTE — ED Provider Notes (Signed)
[facility] EMERGENCY DEPARTMENT Provider Note   CSN: [id number] Arrival date & time: 05/13/19  7829    History   Chief Complaint Chief Complaint  Patient presents with  . Chest Pain    HPI Jose [name] is a 58 y.o. male with PMHx CAD s/p stents 2012, CKF, COPD, HTN, HLD, alcohol abuse, and cocaine abuse who presents to the ED complaining of sudden onset, intermittent, sharp, left sided chest pain that began around 6 PM yesterday. Pt reports that he ate dinner about 4 PM last night and 2 hours later began having the pain; endorses steak and mashed potatoes for dinner. Pt was seen in the ED on 04/27/19 for flu like symptoms as well as chest pain and SOB; he was admitted to the hospital with hyponatremia 120 as well as hyperkalemia 5.5 and elevated troponin although pt has hx of elevated troponins in the past. Pt was discharged on 06/06 and advised to follow up with PCP; he reports he has not done so but feels improved from the nausea, vomiting, and diarrhea he was having upon admission. Pt states this pain feels different than last time. He reports he has not been using cocaine since being admitted; he has also been trying to cut back on his EtOH. No fever, chills, cough, shortness of breath, nausea, vomiting, diarrhea, constipation.        Past Medical History:  Diagnosis Date  . Alcohol abuse   . CAD (coronary artery disease)    a. Reported MI in 2012 s/p 2 stents;  b. 08/2012 Cath: LM 20, LAD 20 diff ISR, jailed septal - 99%, LCX 30ost, RI large, min irregs, RCA 30p, 20-30 ISR-->Med Rx; c. 2017 [facility].  Marland Kitchen CKD (chronic kidney disease), stage III (HCC)   . Cocaine abuse (HCC)   . COPD (chronic obstructive pulmonary disease) (HCC)   . Depression   . Diabetes mellitus without complication (HCC)   . Hyperlipidemia   . Hypertension   . Noncompliance     Patient Active Problem List   Diagnosis Date Noted  . Elevated troponin   . Nausea and  vomiting   . Suspected Covid-19 Virus Infection   . Diarrhea 04/27/2019  . Chest pain 08/30/2017  . Chest pain with high risk for cardiac etiology 08/28/2017  . CKD (chronic kidney disease) stage 3, GFR 30-59 ml/min (HCC) 08/28/2017  . COPD (chronic obstructive pulmonary disease) (HCC) 08/28/2017  . Hyperglycemia 10/17/2015  . Uncontrolled type 2 diabetes mellitus with diabetic nephropathy, with long-term current use of insulin (HCC) 10/17/2015  . Acute renal failure superimposed on stage 3 chronic kidney disease (HCC) 10/17/2015  . Hyperkalemia 10/17/2015  . Dyslipidemia associated with type 2 diabetes mellitus (HCC) 10/17/2015  . Alcohol abuse   . Cocaine abuse (HCC) 06/24/2015  . Hyponatremia 05/12/2015  . Hypertension     Past Surgical History:  Procedure Laterality Date  . CARDIAC CATHETERIZATION    . CORONARY STENT PLACEMENT    . LEFT HEART CATHETERIZATION WITH CORONARY ANGIOGRAM Bilateral 08/25/2012   Procedure: LEFT HEART CATHETERIZATION WITH CORONARY ANGIOGRAM;  Surgeon: Kathleene Hazel, MD;  Location: [facility] CATH LAB;  Service: Cardiovascular;  Laterality: Bilateral;        Home Medications    Prior to Admission medications   Medication Sig Start Date End Date Taking? Authorizing Provider  acetaminophen (TYLENOL) 325 MG tablet Take 325 mg by mouth every 6 (six) hours as needed for headache (pain).  Yes [provider]  albuterol (PROVENTIL HFA;VENTOLIN HFA) 108 (90 BASE) MCG/ACT inhaler Inhale 2 puffs into the lungs every 6 (six) hours as needed for wheezing or shortness of breath.    Yes [provider]  amLODipine (NORVASC) 5 MG tablet Take 1 tablet (5 mg total) by mouth daily. 05/01/19  Yes Rhetta MuraSamtani, Jai-Gurmukh, MD  aspirin EC 81 MG tablet Take 81 mg by mouth daily.   Yes [provider]  atorvastatin (LIPITOR) 40 MG tablet Take 1 tablet (40 mg total) by mouth daily at 6 PM. 04/30/19  Yes Rhetta MuraSamtani, Jai-Gurmukh, MD  brimonidine (ALPHAGAN) 0.2  % ophthalmic solution Place 1 drop into both eyes 2 (two) times daily.  04/11/15  Yes [provider]  budesonide-formoterol (SYMBICORT) 160-4.5 MCG/ACT inhaler Inhale 2 puffs into the lungs 2 (two) times daily.   Yes [provider]  Cyanocobalamin (VITAMIN B-12 PO) Take 1 tablet by mouth daily.    Yes [provider]  doxazosin (CARDURA) 8 MG tablet Take 4 mg by mouth at bedtime.   Yes [provider]  ferrous sulfate 325 (65 FE) MG tablet Take 325 mg by mouth daily with breakfast.   Yes [provider]  folic acid (FOLVITE) 1 MG tablet Take 1 tablet (1 mg total) by mouth daily. 04/30/19  Yes Rhetta MuraSamtani, Jai-Gurmukh, MD  furosemide (LASIX) 40 MG tablet Take 1 tablet (40 mg total) by mouth every other day. 04/30/19  Yes Rhetta MuraSamtani, Jai-Gurmukh, MD  glucose 4 GM chewable tablet Chew 4 g by mouth daily as needed for low blood sugar.    Yes [provider]  hydrALAZINE (APRESOLINE) 10 MG tablet Take 1 tablet (10 mg total) by mouth every 8 (eight) hours. 04/30/19  Yes Rhetta MuraSamtani, Jai-Gurmukh, MD  insulin aspart (NOVOLOG) 100 UNIT/ML injection Inject 0-9 Units into the skin 3 (three) times daily with meals. Patient taking differently: Inject 0-5 Units into the skin 3 (three) times daily with meals. Per sliding scale 07/25/15  Yes Kathlen ModyAkula, Vijaya, MD  insulin glargine (LANTUS) 100 UNIT/ML injection Inject 0.3 mLs (30 Units total) into the skin daily before breakfast. 08/30/17  Yes Zannie CoveJoseph, Preetha, MD  isosorbide mononitrate (IMDUR) 30 MG 24 hr tablet Take 0.5 tablets (15 mg total) by mouth daily. 05/01/19  Yes Rhetta MuraSamtani, Jai-Gurmukh, MD  latanoprost (XALATAN) 0.005 % ophthalmic solution Place 1 drop into both eyes at bedtime.  04/11/15  Yes [provider]  pantoprazole (PROTONIX) 40 MG tablet Take 1 tablet (40 mg total) by mouth daily at 6 (six) AM. 05/01/19  Yes Rhetta MuraSamtani, Jai-Gurmukh, MD  traZODone (DESYREL) 150 MG tablet Take 150 mg by mouth at bedtime.   Yes [provider]    Family History Family History  Problem Relation Age of Onset  . Heart disease Mother        MI 870s    Social History Social History   Tobacco Use  . Smoking status: Former Smoker    Types: Cigarettes    Quit date: 11/25/2007    Years since quitting: 11.4  . Smokeless tobacco: Never Used  Substance Use Topics  . Alcohol use: Yes    Comment: Used to drink heavily - says currently 2 beers/day.  . Drug use: Yes    Types: Marijuana    Comment: reports not using cocaine     Allergies   Lisinopril-hydrochlorothiazide and Simvastatin   Review of Systems Review of Systems  Constitutional: Negative for chills and fever.  HENT: Negative for congestion.  Eyes: Negative for redness.  Respiratory: Negative for cough and shortness of breath.   Cardiovascular: Positive for chest pain. Negative for leg swelling.  Gastrointestinal: Negative for abdominal pain, constipation, diarrhea, nausea and vomiting.  Genitourinary: Negative for dysuria.  Musculoskeletal: Negative for myalgias.  Skin: Negative for rash.  Neurological: Negative for headaches.     Physical Exam Updated Vital Signs Pulse 64   Temp 98.2 F (36.8 C) (Oral)   Resp 17   Ht 5\' 11"  (1.803 m)   Wt 83 kg   SpO2 98%   BMI 25.52 kg/m   Physical Exam Vitals signs and nursing note reviewed.  Constitutional:      Appearance: He is not ill-appearing or diaphoretic.  HENT:     Head: Normocephalic and atraumatic.  Eyes:     Conjunctiva/sclera: Conjunctivae normal.  Neck:     Musculoskeletal: Neck supple.  Cardiovascular:     Rate and Rhythm: Normal rate and regular rhythm.     Pulses:          Radial pulses are 2+ on the right side and 2+ on the left side.     Heart sounds: Normal heart sounds.  Pulmonary:     Effort: Pulmonary effort is normal. No tachypnea.     Breath sounds: Normal breath sounds. No decreased breath sounds, wheezing, rhonchi or rales.  Chest:     Chest wall: No  tenderness.  Abdominal:     Palpations: Abdomen is soft.     Tenderness: There is no abdominal tenderness. There is no guarding or rebound.  Musculoskeletal:     Right lower leg: No edema.     Left lower leg: No edema.  Skin:    General: Skin is warm and dry.  Neurological:     Mental Status: He is alert.      ED Treatments / Results  Labs (all labs ordered are listed, but only abnormal results are displayed) Labs Reviewed  BASIC METABOLIC PANEL - Abnormal; Notable for the following components:      Result Value   Sodium 124 (*)    Chloride 94 (*)    CO2 21 (*)    Glucose, Bld 230 (*)    Creatinine, Ser 1.50 (*)    GFR calc non Af Amer 50 (*)    GFR calc Af Amer 58 (*)    All other components within normal limits  CBC - Abnormal; Notable for the following components:   HCT 36.2 (*)    MCV 76.2 (*)    MCHC 37.0 (*)    All other components within normal limits  TROPONIN I - Abnormal; Notable for the following components:   Troponin I 0.04 (*)    All other components within normal limits  RAPID URINE DRUG SCREEN, HOSP PERFORMED - Abnormal; Notable for the following components:   Cocaine POSITIVE (*)    Tetrahydrocannabinol POSITIVE (*)    All other components within normal limits  TROPONIN I - Abnormal; Notable for the following components:   Troponin I 0.04 (*)    All other components within normal limits  NOVEL CORONAVIRUS, NAA (HOSPITAL ORDER, SEND-OUT TO REF LAB)  TSH  TROPONIN I  TROPONIN I    EKG EKG Interpretation  Date/Time:  Friday May 13 2019 08:17:38 EDT Ventricular Rate:  62 PR Interval:    QRS Duration: 88 QT Interval:  417 QTC Calculation: 424 R Axis:   83 Text Interpretation:  Sinus rhythm Short PR interval Borderline right axis deviation  Probable anteroseptal infarct, old No significant change was found Confirmed by Azalia Bilisampos, Kevin ([id number]) on 05/13/2019 8:46:01 AM   Radiology Dg Chest 2 View  Result Date: 05/13/2019 CLINICAL DATA:   Left-sided chest pain beginning last night. History of COPD. EXAM: CHEST - 2 VIEW COMPARISON:  04/27/2019 FINDINGS: The cardiomediastinal silhouette is within normal limits. The lungs are well inflated without evidence of airspace consolidation, edema, or pneumothorax. There is slight blunting of the posterior costophrenic angles for which trace pleural effusions are not excluded. No acute osseous abnormality is seen. IMPRESSION: No evidence of acute airspace disease. Trace pleural effusions not excluded. Electronically Signed   By: Sebastian AcheAllen  Grady M.D.   On: 05/13/2019 09:19    Procedures Procedures (including critical care time)  Medications Ordered in ED Medications  sodium chloride flush (NS) 0.9 % injection 3 mL (3 mLs Intravenous Not Given 05/13/19 0828)  acetaminophen (TYLENOL) tablet 325 mg (has no administration in time range)  aspirin EC tablet 81 mg (has no administration in time range)  amLODipine (NORVASC) tablet 5 mg (has no administration in time range)  atorvastatin (LIPITOR) tablet 40 mg (has no administration in time range)  doxazosin (CARDURA) tablet 4 mg (has no administration in time range)  furosemide (LASIX) tablet 40 mg (has no administration in time range)  hydrALAZINE (APRESOLINE) tablet 10 mg (has no administration in time range)  isosorbide mononitrate (IMDUR) 24 hr tablet 15 mg (has no administration in time range)  traZODone (DESYREL) tablet 150 mg (has no administration in time range)  insulin glargine (LANTUS) injection 30 Units (has no administration in time range)  pantoprazole (PROTONIX) EC tablet 40 mg (has no administration in time range)  ferrous sulfate tablet 325 mg (has no administration in time range)  mometasone-formoterol (DULERA) 200-5 MCG/ACT inhaler 2 puff (2 puffs Inhalation Not Given 05/13/19 1312)  brimonidine (ALPHAGAN) 0.2 % ophthalmic solution 1 drop (has no administration in time range)  latanoprost (XALATAN) 0.005 % ophthalmic solution 1 drop  (has no administration in time range)  enoxaparin (LOVENOX) injection 40 mg (has no administration in time range)  albuterol (PROVENTIL) (2.5 MG/3ML) 0.083% nebulizer solution 2.5 mg (has no administration in time range)  ondansetron (ZOFRAN) tablet 4 mg (has no administration in time range)    Or  ondansetron (ZOFRAN) injection 4 mg (has no administration in time range)  0.9 %  sodium chloride infusion (has no administration in time range)  insulin aspart (novoLOG) injection 0-9 Units (has no administration in time range)  insulin aspart (novoLOG) injection 0-5 Units (has no administration in time range)  LORazepam (ATIVAN) tablet 1 mg (has no administration in time range)    Or  LORazepam (ATIVAN) injection 1 mg (has no administration in time range)  thiamine (VITAMIN B-1) tablet 100 mg (has no administration in time range)    Or  thiamine (B-1) injection 100 mg (has no administration in time range)  folic acid (FOLVITE) tablet 1 mg (has no administration in time range)  multivitamin with minerals tablet 1 tablet (has no administration in time range)  sodium chloride 0.9 % bolus 1,000 mL (0 mLs Intravenous Stopped 05/13/19 1239)     Initial Impression / Assessment and Plan / ED Course  I have reviewed the triage vital signs and the nursing notes.  Pertinent labs & imaging results that were available during my care of the patient were reviewed by me and considered in my medical decision making (see chart for details).  58 year old male with  significant PMHx including CAD s/p stents and cocaine abuse who presents to the ED for intermittent chest pain that began last night. No other symptoms today. Recently seen in the ED on 06/03 for same as well as SOB, N/V/D, and abdominal pain. Admitted to the hospital with hyponatremia, hyperkalemia, elevated troponins. Discharged home on 06/06 with PCP follow up but has not done so; reports his other symptoms resolved while in the hospital. This chest  pain feels different. Concern for ACS today vs cocaine induced MI; will obtain baseline bloodwork including CBC, BMP, troponin, and UDS. CXR and EKG ordered as well.   EKG without significant change from previous; CXR clear; troponin elevated but this appears to be pt's baseline likely due to CKD. Pt's presentation does not sound to be cardiac in nature given no other symptoms and intermittent chest pain that lasts a couple of seconds. Pt found to be hyponatremic again at 124; decreased from discharge at 130. Unsure what is causing this but it appears pt has been dealing with is chronically on and off since 2016. UDS positive for cocaine and THC. All other labwork unremarkable. Will place call to pt's PCP in light of hyponatremia; unsure what could be done for patient in the hospital at this point besides IVF. Spoke with receptionist at PCP office; awaiting call back from Dr. Theodora Blowkwubunko  Discussed case with Dr. Theodora Blowkwubunko; she has not seen patient in quite some time as [facility]. It is likely patient will not have close follow up outpatient given [facility]; feel he needs to be admitted at this time for electrolyte derangements. Will consult hospitalist.   Hospitalist consulted; will admit patient for hyponatremia/elevated troponins. Will likely need serial troponins.   Clinical Course as of May 12 1546  Fri May 13, 2019  0914 Hyponatremic at 124; 130 at time of discharge 2 weeks ago  Sodium(!): 124 [MV]  0915 Elevated; has hx of elevated troponins; unchanged from 2 weeks ago  Troponin I - ONCE - STAT(!!) [MV]  [phone] [id number] Mildly elevated compared to previous at 1.41; hx of CKD  Creatinine(!): 1.50 [MV]    Clinical Course User Index [MV] Tanda RockersVenter, Phenix Grein, PA-C          Final Clinical Impressions(s) / ED Diagnoses   Final diagnoses:  Hyponatremia  Atypical chest pain  Chronic kidney disease, unspecified CKD stage  Essential hypertension    ED Discharge Orders    None        Tanda RockersVenter, Khole Branch, PA-C 05/13/19 1547    Azalia Bilisampos, Kevin, MD 05/13/19 1619

## 2019-05-13 NOTE — ED Notes (Signed)
Report attempted 

## 2019-05-13 NOTE — ED Notes (Signed)
Pt. Stated, he was ok with everyone knowing he is here but could I call [facility] to let them know he is in the hospital.  Wyatt Haste and informed them of pt is in the hospital. [phone]

## 2019-05-13 NOTE — ED Notes (Signed)
Pt bck from xray

## 2019-05-13 NOTE — H&P (Signed)
[facility]                                                                                    Patient Demographics  Jose [name], is a 58 y.o. male  CSN: [id number]  MRN: [id number]  DOB - December 01, 1960  Admit Date - 05/13/2019  Outpatient Primary MD for the patient is Margot Ableskwubunka-Anyim, Ahunna, MD   With History of -  Past Medical History:  Diagnosis Date  . Alcohol abuse   . CAD (coronary artery disease)    a. Reported MI in 2012 s/p 2 stents;  b. 08/2012 Cath: LM 20, LAD 20 diff ISR, jailed septal - 99%, LCX 30ost, RI large, min irregs, RCA 30p, 20-30 ISR-->Med Rx; c. 2017 Pt reports Neg [facility] @ [facility].  Marland Kitchen. CKD (chronic kidney disease), stage III (HCC)   . Cocaine abuse (HCC)   . COPD (chronic obstructive pulmonary disease) (HCC)   . Depression   . Diabetes mellitus without complication (HCC)   . Hyperlipidemia   . Hypertension   . Noncompliance       Past Surgical History:  Procedure Laterality Date  . CARDIAC CATHETERIZATION    . CORONARY STENT PLACEMENT    . LEFT HEART CATHETERIZATION WITH CORONARY ANGIOGRAM Bilateral 08/25/2012   Procedure: LEFT HEART CATHETERIZATION WITH CORONARY ANGIOGRAM;  Surgeon: Kathleene Hazelhristopher D McAlhany, MD;  [facility];  Service: Cardiovascular;  Laterality: Bilateral;    in for   Chief Complaint  Patient presents with  . Chest Pain     HPI  Jose PattyOscar Halls  is a 58 y.o. male, with past medical history significant for CAD status post 2 stents, chronic kidney disease, diabetes and hypertension in addition to substance abuse (cocaine) presenting today with the 2 days history of sharp left-sided chest pain on and off.  No associated nausea vomiting, cold sweats or diarrhea.  His last cocaine abuse was around 2 weeks ago.  Patient has history of alcoholism and he was advised to cut down.  Patient has also been drinking a lot of water at home because of his hyponatremia!! . In the emergency room his EKG did not show any acute  problems but his troponin was slightly elevated at 0.04 and he was found to be hyponatremic    Review of Systems    In addition to the HPI above,  No Fever-chills, No Headache, No changes with Vision or hearing, No problems swallowing food or Liquids, No Abdominal pain, No Nausea or Vommitting, Bowel movements are regular, No Blood in stool or Urine, No dysuria, No new skin rashes or bruises, No new joints pains-aches,  No new weakness, tingling, numbness in any extremity, No recent weight gain or loss, No polyuria, polydypsia or polyphagia, No significant Mental Stressors.  A full 10 point Review of Systems was done, except as stated above, all other Review of Systems were negative.   Social History Social History   Tobacco Use  . Smoking status: Former Smoker    Types: Cigarettes    Quit date: 11/25/2007    Years since quitting: 11.4  . Smokeless tobacco: Never Used  Substance Use Topics  . Alcohol  use: Yes    Comment: Used to drink heavily - says currently 2 beers/day.     Family History Family History  Problem Relation Age of Onset  . Heart disease Mother        MI [id number]     Prior to Admission medications   Medication Sig Start Date End Date Taking? Authorizing Provider  acetaminophen (TYLENOL) 325 MG tablet Take 325 mg by mouth every 6 (six) hours as needed for headache (pain).   Yes [provider]  albuterol (PROVENTIL HFA;VENTOLIN HFA) 108 (90 BASE) MCG/ACT inhaler Inhale 2 puffs into the lungs every 6 (six) hours as needed for wheezing or shortness of breath.    Yes [provider]  amLODipine (NORVASC) 5 MG tablet Take 1 tablet (5 mg total) by mouth daily. 05/01/19  Yes Rhetta MuraSamtani, Jai-Gurmukh, MD  aspirin EC 81 MG tablet Take 81 mg by mouth daily.   Yes [provider]  atorvastatin (LIPITOR) 40 MG tablet Take 1 tablet (40 mg total) by mouth daily at 6 PM. 04/30/19  Yes Rhetta MuraSamtani, Jai-Gurmukh, MD  brimonidine (ALPHAGAN) 0.2 % ophthalmic  solution Place 1 drop into both eyes 2 (two) times daily.  04/11/15  Yes [provider]  budesonide-formoterol (SYMBICORT) 160-4.5 MCG/ACT inhaler Inhale 2 puffs into the lungs 2 (two) times daily.   Yes [provider]  Cyanocobalamin (VITAMIN B-12 PO) Take 1 tablet by mouth daily.    Yes [provider]  doxazosin (CARDURA) 8 MG tablet Take 4 mg by mouth at bedtime.   Yes [provider]  ferrous sulfate 325 (65 FE) MG tablet Take 325 mg by mouth daily with breakfast.   Yes [provider]  folic acid (FOLVITE) 1 MG tablet Take 1 tablet (1 mg total) by mouth daily. 04/30/19  Yes Rhetta MuraSamtani, Jai-Gurmukh, MD  furosemide (LASIX) 40 MG tablet Take 1 tablet (40 mg total) by mouth every other day. 04/30/19  Yes Rhetta MuraSamtani, Jai-Gurmukh, MD  glucose 4 GM chewable tablet Chew 4 g by mouth daily as needed for low blood sugar.    Yes [provider]  hydrALAZINE (APRESOLINE) 10 MG tablet Take 1 tablet (10 mg total) by mouth every 8 (eight) hours. 04/30/19  Yes Rhetta MuraSamtani, Jai-Gurmukh, MD  insulin aspart (NOVOLOG) 100 UNIT/ML injection Inject 0-9 Units into the skin 3 (three) times daily with meals. Patient taking differently: Inject 0-5 Units into the skin 3 (three) times daily with meals. Per sliding scale 07/25/15  Yes Kathlen ModyAkula, Vijaya, MD  insulin glargine (LANTUS) 100 UNIT/ML injection Inject 0.3 mLs (30 Units total) into the skin daily before breakfast. 08/30/17  Yes Zannie CoveJoseph, Preetha, MD  isosorbide mononitrate (IMDUR) 30 MG 24 hr tablet Take 0.5 tablets (15 mg total) by mouth daily. 05/01/19  Yes Rhetta MuraSamtani, Jai-Gurmukh, MD  latanoprost (XALATAN) 0.005 % ophthalmic solution Place 1 drop into both eyes at bedtime.  04/11/15  Yes [provider]  pantoprazole (PROTONIX) 40 MG tablet Take 1 tablet (40 mg total) by mouth daily at 6 (six) AM. 05/01/19  Yes Rhetta MuraSamtani, Jai-Gurmukh, MD  traZODone (DESYREL) 150 MG tablet Take 150 mg by mouth at bedtime.   Yes [provider]    Allergies  Allergen Reactions  . Lisinopril-Hydrochlorothiazide Swelling    Causes swelling of lips  . Simvastatin Other (See Comments)    [facility] - unknown reaction    Physical Exam  Vitals  Blood pressure (!) 181/96, pulse (!) 57, temperature 98.2 F (  36.8 C), temperature source Oral, resp. rate 11, height 5\' 11"  (1.803 m), weight 83 kg, SpO2 99 %.   1. General well-developed, well-nourished extremely pleasant  2.  Sad affect and insight, Not Suicidal or Homicidal, Awake Alert, Oriented X 3.  3. No F.N deficits, grossly, patient moving all extremities.  4. Ears and Eyes appear Normal, Conjunctivae clear, PERRLA. Moist Oral Mucosa.  5. Supple Neck, No JVD, .  6. Symmetrical Chest wall movement, decreased breath sounds at the bases  7. RRR, No Gallops, Rubs or Murmurs, No Parasternal Heave.  8. Positive Bowel Sounds, Abdomen Soft, Non tender, .  9.  No Cyanosis, Normal Skin Turgor, No Skin Rash or Bruise.  10. Good muscle tone,  joints appear normal , no effusions, Normal ROM.    Data Review  CBC Recent Labs  Lab 05/13/19 0825  WBC 5.6  HGB 13.4  HCT 36.2*  PLT 165  MCV 76.2*  MCH 28.2  MCHC 37.0*  RDW 13.0   ------------------------------------------------------------------------------------------------------------------  Chemistries  Recent Labs  Lab 05/13/19 0825  NA 124*  K 4.2  CL 94*  CO2 21*  GLUCOSE 230*  BUN 18  CREATININE 1.50*  CALCIUM 9.3   ------------------------------------------------------------------------------------------------------------------ estimated creatinine clearance is 55.8 mL/min (A) (by C-G formula based on SCr of 1.5 mg/dL (H)). ------------------------------------------------------------------------------------------------------------------ No results for input(s): TSH, T4TOTAL, T3FREE, THYROIDAB in the last 72 hours.  Invalid input(s): FREET3   Coagulation  profile No results for input(s): INR, PROTIME in the last 168 hours. ------------------------------------------------------------------------------------------------------------------- No results for input(s): DDIMER in the last 72 hours. -------------------------------------------------------------------------------------------------------------------  Cardiac Enzymes Recent Labs  Lab 05/13/19 0825  TROPONINI 0.04*   ------------------------------------------------------------------------------------------------------------------ Invalid input(s): POCBNP   ---------------------------------------------------------------------------------------------------------------  Urinalysis    Component Value Date/Time   COLORURINE YELLOW 04/27/2019 0241   APPEARANCEUR CLEAR 04/27/2019 0241   LABSPEC 1.010 04/27/2019 0241   PHURINE 5.0 04/27/2019 0241   GLUCOSEU NEGATIVE 04/27/2019 0241   HGBUR SMALL (A) 04/27/2019 0241   BILIRUBINUR NEGATIVE 04/27/2019 0241   KETONESUR NEGATIVE 04/27/2019 0241   PROTEINUR 100 (A) 04/27/2019 0241   UROBILINOGEN 1.0 07/23/2015 1403   NITRITE NEGATIVE 04/27/2019 0241   LEUKOCYTESUR NEGATIVE 04/27/2019 0241    ----------------------------------------------------------------------------------------------------------------   Imaging results:   Ct Abdomen Pelvis Wo Contrast  Result Date: 04/27/2019 CLINICAL DATA:  Acute on chronic hyponatremia with nausea and vomiting EXAM: CT ABDOMEN AND PELVIS WITHOUT CONTRAST TECHNIQUE: Multidetector CT imaging of the abdomen and pelvis was performed following the standard protocol without IV contrast. COMPARISON:  None. FINDINGS: Lower chest: No acute abnormality. Hepatobiliary: No focal liver abnormality is seen. No gallstones, gallbladder wall thickening, or biliary dilatation. Pancreas: Unremarkable. No pancreatic ductal dilatation or surrounding inflammatory changes. Spleen: Normal in size without focal abnormality.  Adrenals/Urinary Tract: Adrenal glands are within normal limits bilaterally. 5 mm nonobstructing right renal stone is noted. No ureteral stones are seen. The bladder is decompressed. Stomach/Bowel: Stomach is decompressed. Small bowel dilatation is identified throughout the jejunum and ileum. The distal ileum is within normal limits. The appendix is unremarkable. The colon is air filled with mild fluid within without definitive obstructive change. A true transition zone is not well appreciated. These changes may represent a diffuse small bowel ileus. Correlation with the physical exam is recommended. Vascular/Lymphatic: Aortic atherosclerosis. No enlarged abdominal or pelvic lymph nodes. Reproductive: Prostate is unremarkable. Other: No abdominal wall hernia or abnormality. No abdominopelvic ascites. Musculoskeletal: No acute or significant osseous findings. IMPRESSION: Diffuse small small-bowel dilatation with the exception of  the most distal aspect of the ileum. This may represent a diffuse small bowel ileus. Correlation with the physical exam is recommended. An actual mass or other focal abnormality causing a transition zone is not well appreciated on today's exam. 5 mm nonobstructing right renal stone. Electronically Signed   By: Alcide CleverMark  Lukens M.D.   On: 04/27/2019 13:04   Dg Chest 2 View  Result Date: 05/13/2019 CLINICAL DATA:  Left-sided chest pain beginning last night. History of COPD. EXAM: CHEST - 2 VIEW COMPARISON:  04/27/2019 FINDINGS: The cardiomediastinal silhouette is within normal limits. The lungs are well inflated without evidence of airspace consolidation, edema, or pneumothorax. There is slight blunting of the posterior costophrenic angles for which trace pleural effusions are not excluded. No acute osseous abnormality is seen. IMPRESSION: No evidence of acute airspace disease. Trace pleural effusions not excluded. Electronically Signed   By: Sebastian AcheAllen  Grady M.D.   On: 05/13/2019 09:19   Dg  Chest Port 1 View  Result Date: 04/27/2019 CLINICAL DATA:  Flu like symptoms EXAM: PORTABLE CHEST 1 VIEW COMPARISON:  Chest x-ray dated 08/28/2017 FINDINGS: The heart size and mediastinal contours are within normal limits. Both lungs are clear. The visualized skeletal structures are unremarkable. IMPRESSION: No active disease. Electronically Signed   By: Katherine Mantlehristopher  Green M.D.   On: 04/27/2019 02:58    My personal review of EKG: Rhythm NSR, normal sinus rhythm at 62 bpm/RAD with no acute changes.  No change from before    Assessment & Plan  ACS, EKG shows no acute changes, troponin at 0.04.  Patient chest pain is still on and off Cardiology has been called Serial troponin  Hyponatremia, multifactorial patient has been drinking a lot of water lately and has a history of alcoholism.  Patient is also on Lasix Start normal saline Fluid restriction  History of substance abuse with alcoholism and cocaine Placed on CIWA protocol Ativan for withdrawal from cocaine as well  History of COPD continue with nebulizer treatments  Diabetes mellitus type 2, chronic on Lantus/ISS  Chronic renal insufficiency, stage III Continue with IV fluids normal saline slow   DVT Prophylaxis Lovenox  AM Labs Ordered, also please review Full Orders    Code Status full  Disposition Plan: Home  Time spent in minutes : 38 minutes Condition GUARDED   @SIGNATURE @

## 2019-05-13 NOTE — Consult Note (Addendum)
The patient has been seen in conjunction with Nada BoozerLaura Ingold, NP. All aspects of care have been considered and discussed. The patient has been personally interviewed, examined, and all clinical data has been reviewed.   Denies chest discomfort currently.  Became frightened when starting yesterday he began having episodes of "sharp chest pain, shooting through" the left parasternal area.  These episodes would last less than 2 seconds.  Otherwise no complaints of dyspnea, diaphoresis, nausea, vomiting, or other complaints.  Exam is unremarkable.  No rub is heard.  EKG reveals loss of R wave V1 and V2 when compared to most recent prior EKGs.  No acute ST-T wave changes noted.  Chest pain is atypical for ischemic pain.  The pain sounds more musculoskeletal.  Based on the current data set, no further evaluation needs to be done.  I would recommend cycling cardiac markers but if they remain without evidence of a peak and trough, this should be further confirmation that acute injury/unstable angina is not present.  Cardiology Consultation:   Patient ID: Jose [name] MRN: [id number]; DOB: [birth year]  Admit date: 05/13/2019 Date of Consult: 05/13/2019  Primary Care Provider: Margot Ableskwubunka-Anyim, [place]un[place], MD Primary Cardiologist: Kristeen MissPhilip [name], [facility] Primary Electrophysiologist:  None    Patient Profile:   Jose [name] is a 58 y.o. 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 Sharyon MedicusHijazi.  History of Present Illness:   Jose [name] with above hx including CAD with prior stents to LAD 2011 and overlapping stents to RCA 2010, other vessels nonobstructive disease with cath 2013.  Last nuc 2018 with Positive for transient ischemic dilatation which  suggests diffuse sub endocardial reversible ischemia. Additionally, there may be a small focus of reversible ischemia at the apical anterior wall  Last echo EF 60-65% moderate increased LV wall thickness.   Now with chest pain, sharp lt sided on and off no associated symptoms, last cocaine 2 weeks ago.  Was just in [facility] for chest pain and troponin 0.06; 0.05; 0.04.     Thought to be due to elevated BP.   He also ha diarrhea.    Diarrhea had resolved and he was back to his baseline.  A day and a half ago pt with at first indigestion type pain and then Lt ant chest pain with no radiation, no SOB, no nausea and no diaphoresis.   He was very worried so came to ER.  He did not take NTG but did take ASA.  In ER given dulera and IV fluids.   EKG:  The EKG was personally reviewed and demonstrates:  SR no acute ST changes Telemetry:  Telemetry was personally reviewed and demonstrates:  [facility]   Labs troponin 0.04 Urine screen + cocaine cannabis COVID pending Na 124, K+ 4.2, Glucose 230, Cr 1.50 Hgb 13.4 WBC 5.6 plts 165 2V CXR IMPRESSION: No evidence of acute airspace disease. Trace pleural effusions not excluded.  Currently sitting up in bed eating lunch with BP 177/95, to 181/96. P 53 to 67. He has not had meds today but he tells me he has been taking at home.      Past Medical History:  Diagnosis Date  . Alcohol abuse   .  CAD (coronary artery disease)    a. Reported MI in 2012 s/p 2 stents;  b. 08/2012 Cath: LM 20, LAD 20 diff ISR, jailed septal - 99%, LCX 30ost, RI large, min irregs, RCA 30p, 20-30 ISR-->Med Rx; c. 2017 [facility].  Marland Kitchen CKD (chronic kidney disease), stage III (HCC)   . Cocaine abuse (HCC)   . COPD (chronic obstructive pulmonary disease) (HCC)   . Depression   . Diabetes mellitus without complication (HCC)   . Hyperlipidemia   . Hypertension   . Noncompliance     Past Surgical History:  Procedure Laterality Date  . CARDIAC  CATHETERIZATION    . CORONARY STENT PLACEMENT    . LEFT HEART CATHETERIZATION WITH CORONARY ANGIOGRAM Bilateral 08/25/2012   Procedure: LEFT HEART CATHETERIZATION WITH CORONARY ANGIOGRAM;  Surgeon: Kathleene Hazel, MD;  Location: [facility] CATH LAB;  Service: Cardiovascular;  Laterality: Bilateral;     Home Medications:  Prior to Admission medications   Medication Sig Start Date End Date Taking? Authorizing Provider  acetaminophen (TYLENOL) 325 MG tablet Take 325 mg by mouth every 6 (six) hours as needed for headache (pain).   Yes [provider]  albuterol (PROVENTIL HFA;VENTOLIN HFA) 108 (90 BASE) MCG/ACT inhaler Inhale 2 puffs into the lungs every 6 (six) hours as needed for wheezing or shortness of breath.    Yes [provider]  amLODipine (NORVASC) 5 MG tablet Take 1 tablet (5 mg total) by mouth daily. 05/01/19  Yes Rhetta Mura, MD  aspirin EC 81 MG tablet Take 81 mg by mouth daily.   Yes [provider]  atorvastatin (LIPITOR) 40 MG tablet Take 1 tablet (40 mg total) by mouth daily at 6 PM. 04/30/19  Yes Rhetta Mura, MD  brimonidine (ALPHAGAN) 0.2 % ophthalmic solution Place 1 drop into both eyes 2 (two) times daily.  04/11/15  Yes [provider]  budesonide-formoterol (SYMBICORT) 160-4.5 MCG/ACT inhaler Inhale 2 puffs into the lungs 2 (two) times daily.   Yes [provider]  Cyanocobalamin (VITAMIN B-12 PO) Take 1 tablet by mouth daily.    Yes [provider]  doxazosin (CARDURA) 8 MG tablet Take 4 mg by mouth at bedtime.   Yes [provider]  ferrous sulfate 325 (65 FE) MG tablet Take 325 mg by mouth daily with breakfast.   Yes [provider]  folic acid (FOLVITE) 1 MG tablet Take 1 tablet (1 mg total) by mouth daily. 04/30/19  Yes Rhetta Mura, MD  furosemide (LASIX) 40 MG tablet Take 1 tablet (40 mg total) by mouth every other day. 04/30/19  Yes Rhetta Mura, MD  glucose 4 GM chewable  tablet Chew 4 g by mouth daily as needed for low blood sugar.    Yes [provider]  hydrALAZINE (APRESOLINE) 10 MG tablet Take 1 tablet (10 mg total) by mouth every 8 (eight) hours. 04/30/19  Yes Rhetta Mura, MD  insulin aspart (NOVOLOG) 100 UNIT/ML injection Inject 0-9 Units into the skin 3 (three) times daily with meals. Patient taking differently: Inject 0-5 Units into the skin 3 (three) times daily with meals. Per sliding scale 07/25/15  Yes Kathlen Mody, MD  insulin glargine (LANTUS) 100 UNIT/ML injection Inject 0.3 mLs (30 Units total) into the skin daily before breakfast. 08/30/17  Yes Zannie Cove, MD  isosorbide mononitrate (IMDUR) 30 MG 24 hr tablet Take 0.5 tablets (15 mg total) by mouth daily. 05/01/19  Yes Rhetta Mura, MD  latanoprost (XALATAN) 0.005 %  ophthalmic solution Place 1 drop into both eyes at bedtime.  04/11/15  Yes [provider]  pantoprazole (PROTONIX) 40 MG tablet Take 1 tablet (40 mg total) by mouth daily at 6 (six) AM. 05/01/19  Yes Rhetta MuraSamtani, Jai-Gurmukh, MD  traZODone (DESYREL) 150 MG tablet Take 150 mg by mouth at bedtime.   Yes [provider]    Inpatient Medications: Scheduled Meds: . amLODipine  5 mg Oral Daily  . aspirin EC  81 mg Oral Daily  . atorvastatin  40 mg Oral q1800  . brimonidine  1 drop Both Eyes BID  . doxazosin  4 mg Oral QHS  . enoxaparin (LOVENOX) injection  40 mg Subcutaneous Q24H  . [START ON 05/14/2019] ferrous sulfate  325 mg Oral Q breakfast  . folic acid  1 mg Oral Daily  . furosemide  40 mg Oral QODAY  . hydrALAZINE  10 mg Oral Q8H  . insulin aspart  0-5 Units Subcutaneous QHS  . insulin aspart  0-9 Units Subcutaneous TID WC  . [START ON 05/14/2019] insulin glargine  30 Units Subcutaneous QAC breakfast  . isosorbide mononitrate  15 mg Oral Daily  . latanoprost  1 drop Both Eyes QHS  . mometasone-formoterol  2 puff Inhalation BID  . multivitamin with minerals  1 tablet Oral Daily  . [START  ON 05/14/2019] pantoprazole  40 mg Oral Q0600  . sodium chloride flush  3 mL Intravenous Once  . thiamine  100 mg Oral Daily   Or  . thiamine  100 mg Intravenous Daily  . traZODone  150 mg Oral QHS   Continuous Infusions: . sodium chloride     PRN Meds:   Allergies:    Allergies  Allergen Reactions  . Lisinopril-Hydrochlorothiazide Swelling    Causes swelling of lips  . Simvastatin Other (See Comments)    [facility] - unknown reaction    Social History:   Social History   Socioeconomic History  . Marital status: Married    Spouse name: Not on file  . Number of children: Not on file  . Years of education: Not on file  . Highest education level: Not on file  Occupational History  . Not on file  Social Needs  . Financial resource strain: Not on file  . Food insecurity    Worry: Not on file    Inability: Not on file  . Transportation needs    Medical: Not on file    Non-medical: Not on file  Tobacco Use  . Smoking status: Former Smoker    Types: Cigarettes    Quit date: 11/25/2007    Years since quitting: 11.4  . Smokeless tobacco: Never Used  Substance and Sexual Activity  . Alcohol use: Yes    Comment: Used to drink heavily - says currently 2 beers/day.  . Drug use: Yes    Types: Marijuana    Comment: reports not using cocaine  . Sexual activity: Yes    Birth control/protection: None  Lifestyle  . Physical activity    Days per week: Not on file    Minutes per session: Not on file  . Stress: Not on file  Relationships  . Social Musicianconnections    Talks on phone: Not on file    Gets together: Not on file    Attends religious service: Not on file    Active member of club or organization: Not on file    Attends meetings of clubs or organizations:  Not on file    Relationship status: Not on file  . Intimate partner violence    Fear of current or ex partner: Not on file    Emotionally abused: Not on file    Physically abused:  Not on file    Forced sexual activity: Not on file  Other Topics Concern  . Not on file  Social History Narrative   Lives in [place] by himself.  Does not work or routinely exercise.    Family History:    Family History  Problem Relation Age of Onset  . Heart disease Mother        MI 54s     ROS:  Please see the history of present illness.  General:no colds or fevers, no weight changes Skin:no rashes or ulcers HEENT:no blurred vision, no congestion CV:see HPI PUL:see HPI GI:no diarrhea had resolved, no constipation or melena, no indigestion GU:no hematuria, no dysuria MS:no joint pain, no claudication Neuro:no syncope, no lightheadedness Endo:+ diabetes, no thyroid disease  All other ROS reviewed and negative.     Physical Exam/Data:   Vitals:   05/13/19 0930 05/13/19 1000 05/13/19 1130 05/13/19 1230  BP: (!) 171/84 (!) 186/99 (!) 181/96 (!) 181/90  Pulse: (!) 55 (!) 55 (!) 57 63  Resp: Temp:      TempSrc:      SpO2: 99% 99% 99% 99%  Weight:      Height:        Intake/Output Summary (Last 24 hours) at 05/13/2019 1314 Last data filed at 05/13/2019 1239 Gross per 24 hour  Intake 1000 ml  Output -  Net 1000 ml   Last 3 Weights 05/13/2019 04/27/2019 08/29/2017  Weight (lbs) 183 lb 183 lb 170 lb 6.4 oz  Weight (kg) 83.008 kg 83.008 kg 77.293 kg     Body mass index is 25.52 kg/m.  General:  Well nourished, well developed, in no acute distress HEENT: normal Lymph: no adenopathy Neck: no JVD Endocrine:  No thryomegaly Vascular: No carotid bruits; pedal pulses 1+ bilaterally  Cardiac:  normal S1, S2; RRR; no murmur gallup rub or click Lungs:  clear to auscultation bilaterally, no wheezing, rhonchi or rales  Abd: soft, nontender, no hepatomegaly  Ext: no edema Musculoskeletal:  No deformities, BUE and BLE strength normal and equal Skin: warm and dry  Neuro:  Alert and oriented X 3 MAE follows commands no focal abnormalities noted Psych:  Normal affect      Relevant CV Studies: ECHO 04/27/19  IMPRESSIONS    1. The left ventricle has normal systolic function with an ejection fraction of 60-65%. The cavity size was normal. There is moderately increased left ventricular wall thickness. Left ventricular diastolic Doppler parameters are consistent with impaired  relaxation. No evidence of left ventricular regional wall motion abnormalities.  2. The right ventricle has normal systolic function. The cavity was normal. There is no increase in right ventricular wall thickness. Right ventricular systolic pressure could not be assessed.  3. The aortic valve is tricuspid. Mild sclerosis of the aortic valve.  FINDINGS  Left Ventricle: The left ventricle has normal systolic function, with an ejection fraction of 60-65%. The cavity size was normal. There is moderately increased left ventricular wall thickness. Left ventricular diastolic Doppler parameters are consistent  with impaired relaxation. Normal left ventricular filling pressures No evidence of left ventricular regional wall motion abnormalities..  Right Ventricle: The right ventricle has normal systolic function. The cavity was normal. There is  no increase in right ventricular wall thickness. Right ventricular systolic pressure could not be assessed.  Left Atrium: Left atrial size was normal in size.  Right Atrium: Right atrial size was normal in size. Right atrial pressure is estimated at 10 mmHg.  Interatrial Septum: No atrial level shunt detected by color flow Doppler.  Pericardium: There is no evidence of pericardial effusion.  Mitral Valve: The mitral valve is normal in structure. Mitral valve regurgitation is not visualized by color flow Doppler.  Tricuspid Valve: The tricuspid valve is normal in structure. Tricuspid valve regurgitation was not visualized by color flow Doppler.  Aortic Valve: The aortic valve is tricuspid Mild sclerosis of the aortic valve. Aortic valve  regurgitation was not visualized by color flow Doppler.  Pulmonic Valve: The pulmonic valve was normal in structure. Pulmonic valve regurgitation is trivial by color flow Doppler.  Venous: The inferior vena cava measures 1.79 cm, is normal in size with greater than 50% respiratory variability.  Cardiac cath 2013  Hemodynamic Findings: Central aortic pressure: 166/90 Left ventricular pressure: 163/9/12  Angiographic Findings:  Left main: The left main has calcification and tapers distally with a 20% stenosis.   Left Anterior Descending Artery: Large caliber vessel that courses to the apex. The proximal vessel has a patent stent with minimal restenosis, 20% diffuse restenosis throughout the stent. There are two very small caliber diagonal branches with no disease. The small caliber septal perforating branch is jailed by the stent and has 99% ostial stenosis.   Circumflex Artery: Moderate sized vessel with 30% ostial stenosis.   Ramus Intermediate: Large caliber vessel with several distal branches. Mild plaque in the proximal vessel.   Right Coronary Artery: Large, dominant vessel with 30% proximal stenosis. There is a patent stent in the distal vessel with diffuse 20-30% in-stent restenosis. The PDA is patent with mild plaque.   Left Ventricular Angiogram: Deferred.   Impression: 1. Double vessel CAD with patent stents distal RCA and mid LAD  Recommendations: He will need to remain on cardiac meds. He has been off of all medications. Would recommend a beta blocker, statin, ASA and possibly long acting nitrates if he has recurrence of chest discomfort.          Laboratory Data:  Chemistry Recent Labs  Lab 05/13/19 0825  NA 124*  K 4.2  CL 94*  CO2 21*  GLUCOSE 230*  BUN 18  CREATININE 1.50*  CALCIUM 9.3  GFRNONAA 50*  GFRAA 58*  ANIONGAP 9    No results for input(s): PROT, ALBUMIN, AST, ALT, ALKPHOS, BILITOT in the last 168 hours. Hematology Recent Labs   Lab 05/13/19 0825  WBC 5.6  RBC 4.75  HGB 13.4  HCT 36.2*  MCV 76.2*  MCH 28.2  MCHC 37.0*  RDW 13.0  PLT 165   Cardiac Enzymes Recent Labs  Lab 05/13/19 0825  TROPONINI 0.04*   No results for input(s): TROPIPOC in the last 168 hours.  BNPNo results for input(s): BNP, PROBNP in the last 168 hours.  DDimer No results for input(s): DDIMER in the last 168 hours.  Radiology/Studies:  Dg Chest 2 View  Result Date: 05/13/2019 CLINICAL DATA:  Left-sided chest pain beginning last night. History of COPD. EXAM: CHEST - 2 VIEW COMPARISON:  04/27/2019 FINDINGS: The cardiomediastinal silhouette is within normal limits. The lungs are well inflated without evidence of airspace consolidation, edema, or pneumothorax. There is slight blunting of the posterior costophrenic angles for which trace pleural effusions are not excluded. No acute  osseous abnormality is seen. IMPRESSION: No evidence of acute airspace disease. Trace pleural effusions not excluded. Electronically Signed   By: Logan Bores M.D.   On: 05/13/2019 09:19    Assessment and Plan:   1. Chest pain with troponin 0.04 similar to last admit.  This may be due to HTN now well controlled.  No acute EKG changes.  Continue serial troponin.  Last nuc ws 2018 and neg.  He does have hx of stents and were patent in 2013.   Neg nuc 2018.  Dr. Tamala Julian to see.  ? nuc  But better BP control.  2. CAD with hx of stents to RCA and LAD patent in 2013, he is followed at the [place].  3. HTN poorly controlled on amlodipine 5, cardura 4, hydralazine 10 mg every 8 hours , imdur 15 mg  Would increase imdur and amlodipine Allergy to ACE  4. Hyponatremia has been on lasix now Na at 127. 5. HLD on statin lipitor check LDL 6. Cocaine use + UDS 7. COVID screen pending but no fevers or cough.  Neg last admit.       For questions or updates, please contact [place] Please consult [email or web address] for contact info under     Signed, Cecilie Kicks, NP  05/13/2019  1:14 PM

## 2019-05-13 NOTE — ED Triage Notes (Signed)
Intermittent chest pain that began last night Patient has had 1  Nitro, with no effects and he took a baby Asprin this morning

## 2019-05-13 NOTE — Plan of Care (Signed)
  Problem: Education: Goal: Knowledge of General Education information will improve Description Including pain rating scale, medication(s)/side effects and non-pharmacologic comfort measures Outcome: Progressing   

## 2019-05-13 NOTE — ED Notes (Signed)
Pt transported to xray 

## 2019-05-13 NOTE — ED Notes (Signed)
ED TO INPATIENT HANDOFF REPORT  ED Nurse Name and Phone #: Lanora Manislizabeth [phone]  S Name/Age/Gender Jose [name] 58 y.o. male Room/Bed: 029C/029C  Code Status   Code Status: Prior  Home/SNF/Other Home Patient oriented to: self, place, time and situation Is this baseline? Yes   Triage Complete: Triage complete  Chief Complaint cp  Triage Note Intermittent chest pain that began last night Patient has had 1  Nitro, with no effects and he took a baby Asprin this morning   Allergies Allergies  Allergen Reactions  . Lisinopril-Hydrochlorothiazide Swelling    Causes swelling of lips  . Simvastatin Other (See Comments)    [facility] - unknown reaction    Level of Care/Admitting Diagnosis ED Disposition    ED Disposition Condition Comment   Admit  [facility][id number]  Level of Care: Telemetry Cardiac [103]  I expect the patient will be discharged within 24 hours: No (not a candidate for 5C-Observation unit)  Covid Evaluation: N/A  Diagnosis: Chest pain [454098][[id number]]  Admitting Physician: Sharyon MedicusHIJAZI, ALI Bai.Lain[[id number]]  Attending Physician: Sharyon MedicusHIJAZI, ALI Bai.Lain[[id number]]  PT Class (Do Not Modify): Observation [104]  PT Acc Code (Do Not Modify): Observation [10022]       B Medical/Surgery History Past Medical History:  Diagnosis Date  . Alcohol abuse   . CAD (coronary artery disease)    a. Reported MI in 2012 s/p 2 stents;  b. 08/2012 Cath: LM 20, LAD 20 diff ISR, jailed septal - 99%, LCX 30ost, RI large, min irregs, RCA 30p, 20-30 ISR-->Med Rx; c. 2017 Pt [facility] @ [facility].  Marland Kitchen. CKD (chronic kidney disease), stage III (HCC)   . Cocaine abuse (HCC)   . COPD (chronic obstructive pulmonary disease) (HCC)   . Depression   . Diabetes mellitus without complication (HCC)   . Hyperlipidemia   . Hypertension   . Noncompliance    Past Surgical History:  Procedure Laterality Date  . CARDIAC CATHETERIZATION    . CORONARY  STENT PLACEMENT    . LEFT HEART CATHETERIZATION WITH CORONARY ANGIOGRAM Bilateral 08/25/2012   Procedure: LEFT HEART CATHETERIZATION WITH CORONARY ANGIOGRAM;  Surgeon: Kathleene Hazelhristopher D McAlhany, MD;  [facility];  Service: Cardiovascular;  Laterality: Bilateral;     A IV Location/Drains/Wounds Patient Lines/Drains/Airways Status   Active Line/Drains/Airways    Name:   Placement date:   Placement time:   Site:   Days:   Peripheral IV 05/13/19 Anterior;Right Hand   05/13/19    0816    Hand   less than 1   Incision (Closed)   -    -               Intake/Output Last 24 hours No intake or output data in the 24 hours ending 05/13/19 1228  Labs/Imaging Results for orders placed or performed during the hospital encounter of 05/13/19 (from the past 48 hour(s))  Basic metabolic panel     Status: Abnormal   Collection Time: 05/13/19  8:25 AM  Result Value Ref Range   Sodium 124 (L) 135 - 145 mmol/L   Potassium 4.2 3.5 - 5.1 mmol/L   Chloride 94 (L) 98 - 111 mmol/L   CO2 21 (L) 22 - 32 mmol/L   Glucose, Bld 230 (H) 70 - 99 mg/dL   BUN 18 6 - 20 mg/dL   Creatinine, Ser 1.191.50 (H) 0.61 - 1.24 mg/dL   Calcium 9.3 8.9 - 14.710.3 mg/dL  GFR calc non Af Amer 50 (L) >60 mL/min   GFR calc Af Amer 58 (L) >60 mL/min   Anion gap 9 5 - 15    Comment: Performed at [facility] [address], [place], [place] [address]  CBC     Status: Abnormal   Collection Time: 05/13/19  8:25 AM  Result Value Ref Range   WBC 5.6 4.0 - 10.5 K/uL   RBC 4.75 4.22 - 5.81 MIL/uL   Hemoglobin 13.4 13.0 - 17.0 g/dL   HCT 36.2 (L) 39.0 - 52.0 %   MCV 76.2 (L) 80.0 - 100.0 fL   MCH 28.2 26.0 - 34.0 pg   MCHC 37.0 (H) 30.0 - 36.0 g/dL   RDW 13.0 11.5 - 15.5 %   Platelets 165 150 - 400 K/uL   nRBC 0.0 0.0 - 0.2 %    Comment: Performed at [facility], [place] [address], [place], [place] [address]  Troponin I - ONCE - STAT     Status: Abnormal   Collection Time: 05/13/19  8:25 AM  Result Value Ref Range    Troponin I 0.04 (HH) <0.03 ng/mL    Comment: CRITICAL RESULT CALLED TO, READ BACK BY AND VERIFIED WITH: S.BURCHAN,RN 05/13/2019 0913 DAVISB Performed at [facility], [place] [address], [place], [place] [address]   Urine rapid drug screen (hosp performed)     Status: Abnormal   Collection Time: 05/13/19  8:36 AM  Result Value Ref Range   Opiates NONE DETECTED NONE DETECTED   Cocaine POSITIVE (A) NONE DETECTED   Benzodiazepines NONE DETECTED NONE DETECTED   Amphetamines NONE DETECTED NONE DETECTED   Tetrahydrocannabinol POSITIVE (A) NONE DETECTED   Barbiturates NONE DETECTED NONE DETECTED    Comment: (NOTE) DRUG SCREEN FOR MEDICAL PURPOSES ONLY.  IF CONFIRMATION IS NEEDED FOR ANY PURPOSE, NOTIFY LAB WITHIN 5 DAYS. LOWEST DETECTABLE LIMITS FOR URINE DRUG SCREEN Drug Class                     Cutoff (ng/mL) Amphetamine and metabolites    1000 Barbiturate and metabolites    200 Benzodiazepine                 295 Tricyclics and metabolites     300 Opiates and metabolites        300 Cocaine and metabolites        300 THC                            50 Performed at [facility], [place] [address], [place], [place] [address]    Dg Chest 2 View  Result Date: 05/13/2019 CLINICAL DATA:  Left-sided chest pain beginning last night. History of COPD. EXAM: CHEST - 2 VIEW COMPARISON:  04/27/2019 FINDINGS: The cardiomediastinal silhouette is within normal limits. The lungs are well inflated without evidence of airspace consolidation, edema, or pneumothorax. There is slight blunting of the posterior costophrenic angles for which trace pleural effusions are not excluded. No acute osseous abnormality is seen. IMPRESSION: No evidence of acute airspace disease. Trace pleural effusions not excluded. Electronically Signed   By: Logan Bores M.D.   On: 05/13/2019 09:19    Pending Labs [employer] (From admission, onward)    Start     Ordered   Signed and Marine scientist morning,   R     Signed  and Held   Signed and Held  TSH  Once,   R     Signed and Held   Signed and Held  Troponin I - Now Then Q6H  Now then every 6 hours,   R     Signed and Held          Vitals/Pain Today's Vitals   05/13/19 0930 05/13/19 1000 05/13/19 1130 05/13/19 1200  BP: (!) 171/84 (!) 186/99 (!) 181/96   Pulse: (!) 55 (!) 55 (!) 57   Resp: 13 14 11    Temp:      TempSrc:      SpO2: 99% 99% 99%   Weight:      Height:      PainSc:    7     Isolation Precautions No active isolations  Medications Medications  sodium chloride flush (NS) 0.9 % injection 3 mL (3 mLs Intravenous Not Given 05/13/19 0828)  sodium chloride 0.9 % bolus 1,000 mL (1,000 mLs Intravenous New Bag/Given 05/13/19 0920)    Mobility walks Low fall risk   Focused Assessments Cardiac Assessment Handoff:  Cardiac Rhythm: Normal sinus rhythm Lab Results  Component Value Date   CKTOTAL 541 (H) 08/25/2012   CKMB 8.1 (HH) 08/25/2012   TROPONINI 0.04 (HH) 05/13/2019   Lab Results  Component Value Date   DDIMER 0.55 (H) 04/27/2019   Does the Patient currently have chest pain? Yes     R Recommendations: See Admitting Provider Note  Report given to:   Additional Notes:

## 2019-05-14 DIAGNOSIS — I129 Hypertensive chronic kidney disease with stage 1 through stage 4 chronic kidney disease, or unspecified chronic kidney disease: Secondary | ICD-10-CM | POA: Diagnosis present

## 2019-05-14 DIAGNOSIS — J449 Chronic obstructive pulmonary disease, unspecified: Secondary | ICD-10-CM | POA: Diagnosis present

## 2019-05-14 DIAGNOSIS — F1410 Cocaine abuse, uncomplicated: Secondary | ICD-10-CM

## 2019-05-14 DIAGNOSIS — E875 Hyperkalemia: Secondary | ICD-10-CM | POA: Diagnosis present

## 2019-05-14 DIAGNOSIS — F1490 Cocaine use, unspecified, uncomplicated: Secondary | ICD-10-CM | POA: Diagnosis present

## 2019-05-14 DIAGNOSIS — Z794 Long term (current) use of insulin: Secondary | ICD-10-CM

## 2019-05-14 DIAGNOSIS — E1121 Type 2 diabetes mellitus with diabetic nephropathy: Secondary | ICD-10-CM | POA: Diagnosis not present

## 2019-05-14 DIAGNOSIS — F329 Major depressive disorder, single episode, unspecified: Secondary | ICD-10-CM | POA: Diagnosis present

## 2019-05-14 DIAGNOSIS — Z79899 Other long term (current) drug therapy: Secondary | ICD-10-CM | POA: Diagnosis not present

## 2019-05-14 DIAGNOSIS — N289 Disorder of kidney and ureter, unspecified: Secondary | ICD-10-CM | POA: Diagnosis not present

## 2019-05-14 DIAGNOSIS — I2510 Atherosclerotic heart disease of native coronary artery without angina pectoris: Secondary | ICD-10-CM | POA: Diagnosis present

## 2019-05-14 DIAGNOSIS — R0789 Other chest pain: Secondary | ICD-10-CM | POA: Diagnosis present

## 2019-05-14 DIAGNOSIS — E1165 Type 2 diabetes mellitus with hyperglycemia: Secondary | ICD-10-CM | POA: Diagnosis present

## 2019-05-14 DIAGNOSIS — Z7982 Long term (current) use of aspirin: Secondary | ICD-10-CM | POA: Diagnosis not present

## 2019-05-14 DIAGNOSIS — Z20828 Contact with and (suspected) exposure to other viral communicable diseases: Secondary | ICD-10-CM | POA: Diagnosis present

## 2019-05-14 DIAGNOSIS — K219 Gastro-esophageal reflux disease without esophagitis: Secondary | ICD-10-CM | POA: Diagnosis present

## 2019-05-14 DIAGNOSIS — N183 Chronic kidney disease, stage 3 (moderate): Secondary | ICD-10-CM | POA: Diagnosis present

## 2019-05-14 DIAGNOSIS — I252 Old myocardial infarction: Secondary | ICD-10-CM | POA: Diagnosis not present

## 2019-05-14 DIAGNOSIS — E785 Hyperlipidemia, unspecified: Secondary | ICD-10-CM | POA: Diagnosis present

## 2019-05-14 DIAGNOSIS — F1020 Alcohol dependence, uncomplicated: Secondary | ICD-10-CM | POA: Diagnosis present

## 2019-05-14 DIAGNOSIS — E871 Hypo-osmolality and hyponatremia: Secondary | ICD-10-CM | POA: Diagnosis present

## 2019-05-14 DIAGNOSIS — J410 Simple chronic bronchitis: Secondary | ICD-10-CM

## 2019-05-14 DIAGNOSIS — Z8249 Family history of ischemic heart disease and other diseases of the circulatory system: Secondary | ICD-10-CM | POA: Diagnosis not present

## 2019-05-14 DIAGNOSIS — R7989 Other specified abnormal findings of blood chemistry: Secondary | ICD-10-CM | POA: Diagnosis present

## 2019-05-14 DIAGNOSIS — Z955 Presence of coronary angioplasty implant and graft: Secondary | ICD-10-CM | POA: Diagnosis not present

## 2019-05-14 DIAGNOSIS — Z87891 Personal history of nicotine dependence: Secondary | ICD-10-CM | POA: Diagnosis not present

## 2019-05-14 DIAGNOSIS — Z888 Allergy status to other drugs, medicaments and biological substances status: Secondary | ICD-10-CM | POA: Diagnosis not present

## 2019-05-14 DIAGNOSIS — F1290 Cannabis use, unspecified, uncomplicated: Secondary | ICD-10-CM | POA: Diagnosis present

## 2019-05-14 DIAGNOSIS — I10 Essential (primary) hypertension: Secondary | ICD-10-CM | POA: Diagnosis not present

## 2019-05-14 LAB — BASIC METABOLIC PANEL
Anion gap: 10 (ref 5–15)
BUN: 24 mg/dL — ABNORMAL HIGH (ref 6–20)
CO2: 20 mmol/L — ABNORMAL LOW (ref 22–32)
Calcium: 9 mg/dL (ref 8.9–10.3)
Chloride: 99 mmol/L (ref 98–111)
Creatinine, Ser: 1.7 mg/dL — ABNORMAL HIGH (ref 0.61–1.24)
GFR calc Af Amer: 50 mL/min — ABNORMAL LOW (ref >60)
GFR calc non Af Amer: 43 mL/min — ABNORMAL LOW (ref >60)
Glucose, Bld: 197 mg/dL — ABNORMAL HIGH (ref 70–99)
Potassium: 3.9 mmol/L (ref 3.5–5.1)
Sodium: 129 mmol/L — ABNORMAL LOW (ref 135–145)

## 2019-05-14 LAB — GLUCOSE, CAPILLARY
Glucose-Capillary: 159 mg/dL — ABNORMAL HIGH (ref 70–99)
Glucose-Capillary: 222 mg/dL — ABNORMAL HIGH (ref 70–99)
Glucose-Capillary: 223 mg/dL — ABNORMAL HIGH (ref 70–99)
Glucose-Capillary: 209 mg/dL — ABNORMAL HIGH (ref 70–99)

## 2019-05-14 LAB — TROPONIN I: Troponin I: 0.04 ng/mL (ref <0.03)

## 2019-05-14 LAB — NOVEL CORONAVIRUS, NAA (HOSP ORDER, SEND-OUT TO REF LAB; TAT 18-24 HRS): SARS-CoV-2, NAA: NOT DETECTED

## 2019-05-14 MED ORDER — INSULIN GLARGINE 100 UNIT/ML ~~LOC~~ SOLN
30.0000 [IU] | Freq: Every day | SUBCUTANEOUS | Status: DC
  Administered 2019-05-14 – 2019-05-15 (×2): 30 [IU] via SUBCUTANEOUS
  Filled 2019-05-14 (×2): qty 0.3

## 2019-05-14 NOTE — Care Management Obs Status (Signed)
[place] NOTIFICATION   Patient Details  Name: Jose [name] MRN: [id number] Date of Birth: 1961-01-10   Medicare Observation Status Notification Given:  Yes    Carles Collet, RN 05/14/2019, 9:21 AM

## 2019-05-14 NOTE — Progress Notes (Signed)
PROGRESS NOTE    Jose [name]  UJW:[id number] DOB: [birth year] DOA: 05/13/2019 PCP: Margot Ableskwubunka-Anyim, [place]un[place], MD   Brief Narrative:  58 year old with history of CAD status post stent, CKD stage III, diabetes mellitus type 2, essential hypertension, polysubstance abuse came to the hospital complains of left-sided chest pain.  He was also noted to be hyponatremic and tells me has been drinking a lot of water.  His EKG did not show any acute ST-T changes.  Troponins were initially slightly elevated at 0.04.  Seen by cardiology.   Assessment & Plan:   Active Problems:   Hypertension   Uncontrolled type 2 diabetes mellitus with diabetic nephropathy, with long-term current use of insulin (HCC)   CKD (chronic kidney disease) stage 3, GFR 30-59 ml/min (HCC)   COPD (chronic obstructive pulmonary disease) (HCC)   Chest pain  Atypical chest pain, likely noncardiac History of CAD status post stent -Seen by cardiology.  Cardiac enzymes remain negative.  Needs to quit using any illicit drugs. Recent Echo 65% ef -Continue aspirin, statin  Acute kidney injury on CKD stage III; slightly worse -Creatinine baseline is 1.4, today it is 1.7.  Hyponatremia -Likely from drinking lots of free water.  Currently he is on normal saline.  Monitor electrolytes closely.  Continue Lasix.  History of COPD -Appears to be stable.  Continue home bronchodilators.  Diabetes mellitus type 2 -Lantus 30 units daily.  Insulin sliding scale Accu-Chek.  Essential hypertension -On amlodipine 5 mg orally daily.  Hydralazine 10 mg every 8 hours.  Isosorbide mononitrate 15 mg daily.  GERD -PPI   DVT prophylaxis: Lovenox Code Status: Full code Family Communication: None at bedside Disposition Plan: During hospital stay for at least 24 hours to ensure renal function improves and sodium stabilizes.  In the meantime he requires some IV fluids with electrolytes.  Consultants:   Cardiology  Procedures:   None   Antimicrobials:   None   Subjective: Denies any chest pain at the time of my evaluation.   Review of Systems Otherwise negative except as per HPI, including: General: Denies fever, chills, night sweats or unintended weight loss. Resp: Denies cough, wheezing, shortness of breath. Cardiac: Denies chest pain, palpitations, orthopnea, paroxysmal nocturnal dyspnea. GI: Denies abdominal pain, nausea, vomiting, diarrhea or constipation GU: Denies dysuria, frequency, hesitancy or incontinence MS: Denies muscle aches, joint pain or swelling Neuro: Denies headache, neurologic deficits (focal weakness, numbness, tingling), abnormal gait Psych: Denies anxiety, depression, SI/HI/AVH Skin: Denies new rashes or lesions ID: Denies sick contacts, exotic exposures, travel  Objective: Vitals:   05/14/19 0525 05/14/19 0735 05/14/19 0841 05/14/19 1147  BP: 140/86  (!) 151/84 134/88  Pulse: (!) 57  60 60  Resp: 18  18 20   Temp: 98 F (36.7 C)  98.1 F (36.7 C) 98 F (36.7 C)  TempSrc: Oral  Oral Oral  SpO2: 99% 98% 98% 100%  Weight: 79.7 kg     Height:        Intake/Output Summary (Last 24 hours) at 05/14/2019 1339 Last data filed at 05/14/2019 0900 Gross per 24 hour  Intake 2247.55 ml  Output 600 ml  Net 1647.55 ml   Filed Weights   05/13/19 0819 05/13/19 1323 05/14/19 0525  Weight: 83 kg 78.3 kg 79.7 kg    Examination:  General exam: Appears calm and comfortable ; dry mouth Respiratory system: Clear to auscultation. Respiratory effort normal. Cardiovascular system: S1 & S2 heard, RRR. No JVD, murmurs, rubs, gallops or clicks. No pedal edema. Gastrointestinal system:  Abdomen is nondistended, soft and nontender. No organomegaly or masses felt. Normal bowel sounds heard. Central nervous system: Alert and oriented. No focal neurological deficits. Extremities: Symmetric 5 x 5 power. Skin: No rashes, lesions or ulcers Psychiatry: Judgement and insight appear normal. Mood & affect  appropriate.     Data Reviewed:   CBC: Recent Labs  Lab 05/13/19 0825  WBC 5.6  HGB 13.4  HCT 36.2*  MCV 76.2*  PLT 786   Basic Metabolic Panel: Recent Labs  Lab 05/13/19 0825 05/14/19 0118  NA 124* 129*  K 4.2 3.9  CL 94* 99  CO2 21* 20*  GLUCOSE 230* 197*  BUN 18 24*  CREATININE 1.50* 1.70*  CALCIUM 9.3 9.0   GFR: Estimated Creatinine Clearance: 49.2 mL/min (A) (by C-G formula based on SCr of 1.7 mg/dL (H)). Liver Function Tests: No results for input(s): AST, ALT, ALKPHOS, BILITOT, PROT, ALBUMIN in the last 168 hours. No results for input(s): LIPASE, AMYLASE in the last 168 hours. No results for input(s): AMMONIA in the last 168 hours. Coagulation Profile: No results for input(s): INR, PROTIME in the last 168 hours. Cardiac Enzymes: Recent Labs  Lab 05/13/19 0825 05/13/19 1330 05/13/19 1858 05/14/19 0118  TROPONINI 0.04* 0.04* 0.04* 0.04*   BNP (last 3 results) No results for input(s): PROBNP in the last 8760 hours. HbA1C: No results for input(s): HGBA1C in the last 72 hours. CBG: Recent Labs  Lab 05/13/19 1608 05/13/19 2112 05/14/19 0605 05/14/19 1145  GLUCAP 373* 97 159* 222*   Lipid Profile: No results for input(s): CHOL, HDL, LDLCALC, TRIG, CHOLHDL, LDLDIRECT in the last 72 hours. Thyroid Function Tests: Recent Labs    05/13/19 1340  TSH 1.349   Anemia Panel: No results for input(s): VITAMINB12, FOLATE, FERRITIN, TIBC, IRON, RETICCTPCT in the last 72 hours. Sepsis Labs: No results for input(s): PROCALCITON, LATICACIDVEN in the last 168 hours.  No results found for this or any previous visit (from the past 240 hour(s)).       Radiology Studies: Dg Chest 2 View  Result Date: 05/13/2019 CLINICAL DATA:  Left-sided chest pain beginning last night. History of COPD. EXAM: CHEST - 2 VIEW COMPARISON:  04/27/2019 FINDINGS: The cardiomediastinal silhouette is within normal limits. The lungs are well inflated without evidence of airspace  consolidation, edema, or pneumothorax. There is slight blunting of the posterior costophrenic angles for which trace pleural effusions are not excluded. No acute osseous abnormality is seen. IMPRESSION: No evidence of acute airspace disease. Trace pleural effusions not excluded. Electronically Signed   By: Logan Bores M.D.   On: 05/13/2019 09:19        Scheduled Meds: . amLODipine  5 mg Oral Daily  . aspirin EC  81 mg Oral Daily  . atorvastatin  40 mg Oral q1800  . brimonidine  1 drop Both Eyes BID  . doxazosin  4 mg Oral QHS  . enoxaparin (LOVENOX) injection  40 mg Subcutaneous Q24H  . ferrous sulfate  325 mg Oral Q breakfast  . folic acid  1 mg Oral Daily  . furosemide  40 mg Oral QODAY  . hydrALAZINE  10 mg Oral Q8H  . insulin aspart  0-5 Units Subcutaneous QHS  . insulin aspart  0-9 Units Subcutaneous TID WC  . insulin glargine  30 Units Subcutaneous QAC breakfast  . isosorbide mononitrate  15 mg Oral Daily  . latanoprost  1 drop Both Eyes QHS  . mometasone-formoterol  2 puff Inhalation BID  . multivitamin with  minerals  1 tablet Oral Daily  . pantoprazole  40 mg Oral Q0600  . sodium chloride flush  3 mL Intravenous Once  . thiamine  100 mg Oral Daily   Or  . thiamine  100 mg Intravenous Daily  . traZODone  150 mg Oral QHS   Continuous Infusions: . sodium chloride 50 mL/hr at 05/14/19 1217     LOS: 0 days   Time spent= 25 mins    Jordain Radin Joline Maxcyhirag Seniah Lawrence, MD [facility]  If 7PM-7AM, please contact night-coverage [email or web address] 05/14/2019, 1:39 PM

## 2019-05-14 NOTE — Progress Notes (Signed)
Progress Note  Patient Name: Jose [name] Date of Encounter: 05/14/2019  Primary Cardiologist: Kristeen MissPhilip [name], MD   Subjective   No further chest pain.  Has desire to stop cocaine  Inpatient Medications    Scheduled Meds: . amLODipine  5 mg Oral Daily  . aspirin EC  81 mg Oral Daily  . atorvastatin  40 mg Oral q1800  . brimonidine  1 drop Both Eyes BID  . doxazosin  4 mg Oral QHS  . enoxaparin (LOVENOX) injection  40 mg Subcutaneous Q24H  . ferrous sulfate  325 mg Oral Q breakfast  . folic acid  1 mg Oral Daily  . furosemide  40 mg Oral QODAY  . hydrALAZINE  10 mg Oral Q8H  . insulin aspart  0-5 Units Subcutaneous QHS  . insulin aspart  0-9 Units Subcutaneous TID WC  . insulin glargine  30 Units Subcutaneous QAC breakfast  . isosorbide mononitrate  15 mg Oral Daily  . latanoprost  1 drop Both Eyes QHS  . mometasone-formoterol  2 puff Inhalation BID  . multivitamin with minerals  1 tablet Oral Daily  . pantoprazole  40 mg Oral Q0600  . sodium chloride flush  3 mL Intravenous Once  . thiamine  100 mg Oral Daily   Or  . thiamine  100 mg Intravenous Daily  . traZODone  150 mg Oral QHS   Continuous Infusions: . sodium chloride 50 mL/hr at 05/14/19 1217   PRN Meds: acetaminophen, albuterol, LORazepam **OR** LORazepam, ondansetron **OR** ondansetron (ZOFRAN) IV   Vital Signs    Vitals:   05/14/19 0525 05/14/19 0735 05/14/19 0841 05/14/19 1147  BP: 140/86  (!) 151/84 134/88  Pulse: (!) 57  60 60  Resp: 18  18 20   Temp: 98 F (36.7 C)  98.1 F (36.7 C) 98 F (36.7 C)  TempSrc: Oral  Oral Oral  SpO2: 99% 98% 98% 100%  Weight: 79.7 kg     Height:        Intake/Output Summary (Last 24 hours) at 05/14/2019 1307 Last data filed at 05/14/2019 0900 Gross per 24 hour  Intake 2247.55 ml  Output 600 ml  Net 1647.55 ml   Last 3 Weights 05/14/2019 05/13/2019 05/13/2019  Weight (lbs) 175 lb 9.6 oz 172 lb 9.9 oz 183 lb  Weight (kg) 79.652 kg 78.3 kg 83.008 kg       Telemetry    No adverse arrhythmias- Personally Reviewed  ECG    Loss of R wave V1 and V2- Personally Reviewed  Physical Exam   GEN: No acute distress.   Neck: No JVD Cardiac: RRR, no murmurs, rubs, or gallops.  Respiratory: Clear to auscultation bilaterally. GI: Soft, nontender, non-distended  MS: No edema; No deformity. Neuro:  Nonfocal  Psych: Normal affect   Labs    Chemistry Recent Labs  Lab 05/13/19 0825 05/14/19 0118  NA 124* 129*  K 4.2 3.9  CL 94* 99  CO2 21* 20*  GLUCOSE 230* 197*  BUN 18 24*  CREATININE 1.50* 1.70*  CALCIUM 9.3 9.0  GFRNONAA 50* 43*  GFRAA 58* 50*  ANIONGAP 9 10     Hematology Recent Labs  Lab 05/13/19 0825  WBC 5.6  RBC 4.75  HGB 13.4  HCT 36.2*  MCV 76.2*  MCH 28.2  MCHC 37.0*  RDW 13.0  PLT 165    Cardiac Enzymes Recent Labs  Lab 05/13/19 0825 05/13/19 1330 05/13/19 1858 05/14/19 0118  TROPONINI 0.04* 0.04* 0.04* 0.04*   No  results for input(s): TROPIPOC in the last 168 hours.   BNPNo results for input(s): BNP, PROBNP in the last 168 hours.   DDimer No results for input(s): DDIMER in the last 168 hours.   Radiology    Dg Chest 2 View  Result Date: 05/13/2019 CLINICAL DATA:  Left-sided chest pain beginning last night. History of COPD. EXAM: CHEST - 2 VIEW COMPARISON:  04/27/2019 FINDINGS: The cardiomediastinal silhouette is within normal limits. The lungs are well inflated without evidence of airspace consolidation, edema, or pneumothorax. There is slight blunting of the posterior costophrenic angles for which trace pleural effusions are not excluded. No acute osseous abnormality is seen. IMPRESSION: No evidence of acute airspace disease. Trace pleural effusions not excluded. Electronically Signed   By: Logan Bores M.D.   On: 05/13/2019 09:19    Cardiac Studies   Normal EF on prior echo  Patient Profile     58 y.o. male with chest pain, low level troponin 0 0.04 possibly secondary to hypertension with  known CAD cocaine use hyponatremia.  Assessment & Plan    Chest discomfort - Yesterday describes sharp shooting chest pain frightened left parasternal area with loss of R wave in V1 and V2.  Atypical.  Cardiac markers remain low and flat at 0.04. -No further cardiac testing -Recent echocardiogram 65% EF. -Avoid cocaine -may result in cardiac mortality.  Please let us know if we can be of further assistance.  Signing off.     For questions or updates, please contact [place] Please consult [email or web address] for contact info under        Signed, Candee Furbish, MD  05/14/2019, 1:07 PM

## 2019-05-15 ENCOUNTER — Other Ambulatory Visit: Payer: Self-pay

## 2019-05-15 DIAGNOSIS — N289 Disorder of kidney and ureter, unspecified: Secondary | ICD-10-CM

## 2019-05-15 LAB — BASIC METABOLIC PANEL
Anion gap: 10 (ref 5–15)
BUN: 18 mg/dL (ref 6–20)
CO2: 20 mmol/L — ABNORMAL LOW (ref 22–32)
Calcium: 9.6 mg/dL (ref 8.9–10.3)
Chloride: 103 mmol/L (ref 98–111)
Creatinine, Ser: 1.47 mg/dL — ABNORMAL HIGH (ref 0.61–1.24)
GFR calc Af Amer: 59 mL/min — ABNORMAL LOW (ref >60)
GFR calc non Af Amer: 51 mL/min — ABNORMAL LOW (ref >60)
Glucose, Bld: 126 mg/dL — ABNORMAL HIGH (ref 70–99)
Potassium: 3.8 mmol/L (ref 3.5–5.1)
Sodium: 133 mmol/L — ABNORMAL LOW (ref 135–145)

## 2019-05-15 LAB — GLUCOSE, CAPILLARY
Glucose-Capillary: 206 mg/dL — ABNORMAL HIGH (ref 70–99)
Glucose-Capillary: 130 mg/dL — ABNORMAL HIGH (ref 70–99)

## 2019-05-15 MED ORDER — THIAMINE HCL 100 MG PO TABS: 100.0000 mg | ORAL_TABLET | Freq: Every day | ORAL | 3 refills | Status: AC

## 2019-05-15 NOTE — Discharge Summary (Signed)
Physician Discharge Summary  Dolores PattyOscar Sako ZOX:[id number] DOB: 12/22/1960 DOA: 05/13/2019  PCP: Margot Ableskwubunka-Anyim, Ahunna, MD  Admit date: 05/13/2019 Discharge date: 05/15/2019  Admitted From: Home Disposition: Home  Recommendations for Outpatient Follow-up:  1. Follow up with PCP in 1-2 weeks 2. Please obtain BMP/CBC in one week your next doctors visit.  To assess for renal function and sodium levels 3. Advised to take folate, multivitamin and thiamine at home   Discharge Condition: Stable CODE STATUS: Full code Diet recommendation: Heart healthy/diabetic  Brief/Interim Summary: 58 year old with history of CAD status post stent, CKD stage III, diabetes mellitus type 2, essential hypertension, polysubstance abuse came to the hospital complains of left-sided chest pain.  He was also noted to be hyponatremic and tells me has been drinking a lot of water.  His EKG did not show any acute ST-T changes.  Troponins were initially slightly elevated at 0.04.  Seen by cardiology.  Patient's troponins remain flat, recent echocardiogram showed ejection fraction 45% therefore cardiology recommended avoiding any illicit drugs otherwise no further inpatient cardiac testing.  Patient's sodium level improved as well with IV fluids along with renal function.  Today patient is stable to be discharged with outpatient follow-up recommendations as stated above.   Discharge Diagnoses:  Active Problems:   Hypertension   Uncontrolled type 2 diabetes mellitus with diabetic nephropathy, with long-term current use of insulin (HCC)   CKD (chronic kidney disease) stage 3, GFR 30-59 ml/min (HCC)   COPD (chronic obstructive pulmonary disease) (HCC)   Chest pain  Atypical chest pain, likely noncardiac, Resolved History of CAD status post stent -Seen by cardiology.  Cardiac enzymes remain negative.  Needs to quit using any illicit drugs. Recent Echo 65% ef -Continue aspirin, statin  Acute kidney injury on CKD stage III;  slightly worse -Creatinine baseline is 1.4, resolved.  Trended down to 1.47.  Hyponatremia -This is resolved with IV fluids here as well.  Currently at baseline of 133.  Advised to get repeat lab work outpatient in about 1 week with his primary care provider.  History of COPD -Appears to be stable.  Continue home bronchodilators.  Diabetes mellitus type 2 -Resume home regimen  Essential hypertension -On amlodipine 5 mg orally daily.  Hydralazine 10 mg every 8 hours.  Isosorbide mononitrate 15 mg daily.  GERD -PPI   Consultations:  Cardiology  Subjective: Feels better, ambulating well.  Chest pain-free.  Wishes to go home.  Discharge Exam: Vitals:   05/15/19 1025 05/15/19 1140  BP:  (!) 142/86  Pulse: 63 62  Resp: 16 20  Temp:  98.1 F (36.7 C)  SpO2: 98% 100%   Vitals:   05/15/19 0619 05/15/19 0847 05/15/19 1025 05/15/19 1140  BP: (!) 178/90 (!) 151/83  (!) 142/86  Pulse: (!) 56 63 63 62  Resp:   16 20  Temp:    98.1 F (36.7 C)  TempSrc:    Oral  SpO2:  98% 98% 100%  Weight:      Height:        General: Pt is alert, awake, not in acute distress Cardiovascular: RRR, S1/S2 +, no rubs, no gallops Respiratory: CTA bilaterally, no wheezing, no rhonchi Abdominal: Soft, NT, ND, bowel sounds + Extremities: no edema, no cyanosis  Discharge Instructions  Discharge Instructions    Call MD for:  difficulty breathing, headache or visual disturbances   Complete by: As directed    Diet - low sodium heart healthy   Complete by: As directed  Discharge instructions   Complete by: As directed    Advised to quit drinking alcohol, tobacco smoking and illicit drugs.   Increase activity slowly   Complete by: As directed      Allergies as of 05/15/2019      Reactions   Lisinopril-hydrochlorothiazide Swelling   Causes swelling of lips   Simvastatin Other (See Comments)   Reported by [facility] 04/10/16 - unknown reaction      Medication List     STOP taking these medications   acetaminophen 325 MG tablet Commonly known as: TYLENOL   aspirin EC 81 MG tablet     TAKE these medications   albuterol 108 (90 Base) MCG/ACT inhaler Commonly known as: VENTOLIN HFA Inhale 2 puffs into the lungs every 6 (six) hours as needed for wheezing or shortness of breath.   amLODipine 5 MG tablet Commonly known as: NORVASC Take 1 tablet (5 mg total) by mouth daily.   atorvastatin 40 MG tablet Commonly known as: LIPITOR Take 1 tablet (40 mg total) by mouth daily at 6 PM.   brimonidine 0.2 % ophthalmic solution Commonly known as: ALPHAGAN Place 1 drop into both eyes 2 (two) times daily.   budesonide-formoterol 160-4.5 MCG/ACT inhaler Commonly known as: SYMBICORT Inhale 2 puffs into the lungs 2 (two) times daily.   doxazosin 8 MG tablet Commonly known as: CARDURA Take 4 mg by mouth at bedtime.   ferrous sulfate 325 (65 FE) MG tablet Take 325 mg by mouth daily with breakfast.   folic acid 1 MG tablet Commonly known as: FOLVITE Take 1 tablet (1 mg total) by mouth daily.   furosemide 40 MG tablet Commonly known as: LASIX Take 1 tablet (40 mg total) by mouth every other day.   glucose 4 GM chewable tablet Chew 4 g by mouth daily as needed for low blood sugar.   hydrALAZINE 10 MG tablet Commonly known as: APRESOLINE Take 1 tablet (10 mg total) by mouth every 8 (eight) hours.   insulin aspart 100 UNIT/ML injection Commonly known as: novoLOG Inject 0-9 Units into the skin 3 (three) times daily with meals. What changed:   how much to take  additional instructions   insulin glargine 100 UNIT/ML injection Commonly known as: LANTUS Inject 0.3 mLs (30 Units total) into the skin daily before breakfast.   isosorbide mononitrate 30 MG 24 hr tablet Commonly known as: IMDUR Take 0.5 tablets (15 mg total) by mouth daily.   latanoprost 0.005 % ophthalmic solution Commonly known as: XALATAN Place 1 drop into both eyes at  bedtime.   pantoprazole 40 MG tablet Commonly known as: PROTONIX Take 1 tablet (40 mg total) by mouth daily at 6 (six) AM.   thiamine 100 MG tablet Take 1 tablet (100 mg total) by mouth daily for 30 days. Start taking on: May 16, 2019   traZODone 150 MG tablet Commonly known as: DESYREL Take 150 mg by mouth at bedtime.   VITAMIN B-12 PO Take 1 tablet by mouth daily.      Follow-up Information    Margot Ables, MD. Schedule an appointment as soon as possible for a visit in 1 week(s).   Specialty: Family Medicine Contact information: [address] [place] [address] [phone]        [name], Deloris Ping, MD .   Specialty: Cardiology Contact information: [address] [place] [address] [phone]  Allergies  Allergen Reactions  . Lisinopril-Hydrochlorothiazide Swelling    Causes swelling of lips  . Simvastatin Other (See Comments)    [facility] 04/10/16 - unknown reaction    You were cared for by a hospitalist during your hospital stay. If you have any questions about your discharge medications or the care you received while you were in the hospital after you are discharged, you can call the unit and asked to speak with the hospitalist on call if the hospitalist that took care of you is not available. Once you are discharged, your primary care physician will handle any further medical issues. Please note that no refills for any discharge medications will be authorized once you are discharged, as it is imperative that you return to your primary care physician (or establish a relationship with a primary care physician if you do not have one) for your aftercare needs so that they can reassess your need for medications and monitor your lab values.   Procedures/Studies: Ct Abdomen Pelvis Wo Contrast  Result Date: 04/27/2019 CLINICAL DATA:  Acute on chronic  hyponatremia with nausea and vomiting EXAM: CT ABDOMEN AND PELVIS WITHOUT CONTRAST TECHNIQUE: Multidetector CT imaging of the abdomen and pelvis was performed following the standard protocol without IV contrast. COMPARISON:  None. FINDINGS: Lower chest: No acute abnormality. Hepatobiliary: No focal liver abnormality is seen. No gallstones, gallbladder wall thickening, or biliary dilatation. Pancreas: Unremarkable. No pancreatic ductal dilatation or surrounding inflammatory changes. Spleen: Normal in size without focal abnormality. Adrenals/Urinary Tract: Adrenal glands are within normal limits bilaterally. 5 mm nonobstructing right renal stone is noted. No ureteral stones are seen. The bladder is decompressed. Stomach/Bowel: Stomach is decompressed. Small bowel dilatation is identified throughout the jejunum and ileum. The distal ileum is within normal limits. The appendix is unremarkable. The colon is air filled with mild fluid within without definitive obstructive change. A true transition zone is not well appreciated. These changes may represent a diffuse small bowel ileus. Correlation with the physical exam is recommended. Vascular/Lymphatic: Aortic atherosclerosis. No enlarged abdominal or pelvic lymph nodes. Reproductive: Prostate is unremarkable. Other: No abdominal wall hernia or abnormality. No abdominopelvic ascites. Musculoskeletal: No acute or significant osseous findings. IMPRESSION: Diffuse small small-bowel dilatation with the exception of the most distal aspect of the ileum. This may represent a diffuse small bowel ileus. Correlation with the physical exam is recommended. An actual mass or other focal abnormality causing a transition zone is not well appreciated on today's exam. 5 mm nonobstructing right renal stone. Electronically Signed   By: Alcide CleverMark  Lukens M.D.   On: 04/27/2019 13:04   Dg Chest 2 View  Result Date: 05/13/2019 CLINICAL DATA:  Left-sided chest pain beginning last night. History of  COPD. EXAM: CHEST - 2 VIEW COMPARISON:  04/27/2019 FINDINGS: The cardiomediastinal silhouette is within normal limits. The lungs are well inflated without evidence of airspace consolidation, edema, or pneumothorax. There is slight blunting of the posterior costophrenic angles for which trace pleural effusions are not excluded. No acute osseous abnormality is seen. IMPRESSION: No evidence of acute airspace disease. Trace pleural effusions not excluded. Electronically Signed   By: Sebastian AcheAllen  Grady M.D.   On: 05/13/2019 09:19   Dg Chest Port 1 View  Result Date: 04/27/2019 CLINICAL DATA:  Flu like symptoms EXAM: PORTABLE CHEST 1 VIEW COMPARISON:  Chest x-ray dated 08/28/2017 FINDINGS: The heart size and mediastinal contours are within normal limits. Both lungs are clear. The visualized skeletal structures are unremarkable.  IMPRESSION: No active disease. Electronically Signed   By: Constance Holster M.D.   On: 04/27/2019 02:58      The results of significant diagnostics from this hospitalization (including imaging, microbiology, ancillary and laboratory) are listed below for reference.     Microbiology: Recent Results (from the past 240 hour(s))  Novel Coronavirus,NAA,(SEND-OUT TO REF LAB - TAT 24-48 hrs); Hosp Order     Status: None   Collection Time: 05/13/19 12:40 PM   Specimen: Nasopharyngeal Swab; Respiratory  Result Value Ref Range Status   SARS-CoV-2, NAA NOT DETECTED NOT DETECTED Final    Comment: (NOTE) This test was developed and its performance characteristics determined by [employer]. This test has not been FDA cleared or approved. This test has been authorized by FDA under an Emergency Use Authorization (EUA). This test is only authorized for the duration of time the declaration that circumstances exist justifying the authorization of the emergency use of in vitro diagnostic tests for detection of SARS-CoV-2 virus and/or diagnosis of COVID-19 infection under section  564(b)(1) of the Act, 21 U.S.C. 643PIR-5(J)(8), unless the authorization is terminated or revoked sooner. When diagnostic testing is negative, the possibility of a false negative result should be considered in the context of a patient's recent exposures and the presence of clinical signs and symptoms consistent with COVID-19. An individual without symptoms of COVID-19 and who is not shedding SARS-CoV-2 virus would expect to have a negative (not detected) result in this assay. Performed  At: [facility] [address] [place], [place] [id number] Rush Farmer MD ZS:[id number]    [place]  Final    Comment: Performed at [facility], [place] [address], [place], [place] [address]     Labs: BNP (last 3 results) No results for input(s): BNP in the last 8760 hours. Basic Metabolic Panel: Recent Labs  Lab 05/13/19 0825 05/14/19 0118 05/15/19 0825  NA 124* 129* 133*  K 4.2 3.9 3.8  CL 94* 99 103  CO2 21* 20* 20*  GLUCOSE 230* 197* 126*  BUN 18 24* 18  CREATININE 1.50* 1.70* 1.47*  CALCIUM 9.3 9.0 9.6   Liver Function Tests: No results for input(s): AST, ALT, ALKPHOS, BILITOT, PROT, ALBUMIN in the last 168 hours. No results for input(s): LIPASE, AMYLASE in the last 168 hours. No results for input(s): AMMONIA in the last 168 hours. CBC: Recent Labs  Lab 05/13/19 0825  WBC 5.6  HGB 13.4  HCT 36.2*  MCV 76.2*  PLT 165   Cardiac Enzymes: Recent Labs  Lab 05/13/19 0825 05/13/19 1330 05/13/19 1858 05/14/19 0118  TROPONINI 0.04* 0.04* 0.04* 0.04*   BNP: Invalid input(s): POCBNP CBG: Recent Labs  Lab 05/14/19 1145 05/14/19 1701 05/14/19 2101 05/15/19 0607 05/15/19 1136  GLUCAP 222* 223* 209* 206* 130*   D-Dimer No results for input(s): DDIMER in the last 72 hours. Hgb A1c No results for input(s): HGBA1C in the last 72 hours. Lipid Profile No results for input(s): CHOL, HDL, LDLCALC, TRIG, CHOLHDL, LDLDIRECT in the last 72  hours. Thyroid function studies Recent Labs    05/13/19 1340  TSH 1.349   Anemia work up No results for input(s): VITAMINB12, FOLATE, FERRITIN, TIBC, IRON, RETICCTPCT in the last 72 hours. Urinalysis    Component Value Date/Time   COLORURINE YELLOW 04/27/2019 0241   APPEARANCEUR CLEAR 04/27/2019 0241   LABSPEC 1.010 04/27/2019 0241   PHURINE 5.0 04/27/2019 0241   GLUCOSEU NEGATIVE 04/27/2019 0241   HGBUR SMALL (A) 04/27/2019 0241  BILIRUBINUR NEGATIVE 04/27/2019 0241   KETONESUR NEGATIVE 04/27/2019 0241   PROTEINUR 100 (A) 04/27/2019 0241   UROBILINOGEN 1.0 07/23/2015 1403   NITRITE NEGATIVE 04/27/2019 0241   LEUKOCYTESUR NEGATIVE 04/27/2019 0241   Sepsis Labs Invalid input(s): PROCALCITONIN,  WBC,  LACTICIDVEN Microbiology Recent Results (from the past 240 hour(s))  Novel Coronavirus,NAA,(SEND-OUT TO REF LAB - TAT 24-48 hrs); Hosp Order     Status: None   Collection Time: 05/13/19 12:40 PM   Specimen: Nasopharyngeal Swab; Respiratory  Result Value Ref Range Status   SARS-CoV-2, NAA NOT DETECTED NOT DETECTED Final    Comment: (NOTE) This test was developed and its performance characteristics determined by [facility]. This test has not been FDA cleared or approved. This test has been authorized by FDA under an Emergency Use Authorization (EUA). This test is only authorized for the duration of time the declaration that circumstances exist justifying the authorization of the emergency use of in vitro diagnostic tests for detection of SARS-CoV-2 virus and/or diagnosis of COVID-19 infection under section 564(b)(1) of the Act, 21 U.S.C. 161WRU-0(A)(5360bbb-3(b)(1), unless the authorization is terminated or revoked sooner. When diagnostic testing is negative, the possibility of a false negative result should be considered in the context of a patient's recent exposures and the presence of clinical signs and symptoms consistent with COVID-19. An individual without symptoms  of COVID-19 and who is not shedding SARS-CoV-2 virus would expect to have a negative (not detected) result in this assay. Performed  At: [facility] [address] [place], [place] [id number] Jolene SchimkeNagendra Sanjai MD NW:[id number]:[phone]    Coronavirus Source NASOPHARYNGEAL  Final    Comment: Performed at [facility], [address] [place], [place] [address]     Time coordinating discharge:  I have spent 35 minutes face to face with the patient and on the ward discussing the patients care, assessment, plan and disposition with other care givers. >50% of the time was devoted counseling the patient about the risks and benefits of treatment/Discharge disposition and coordinating care.   SIGNED:   Dimple NanasAnkit Chirag Alicia Ackert, MD  [facility] 05/15/2019, 11:43 AM   If 7PM-7AM, please contact night-coverage [email or web address]

## 2019-05-15 NOTE — Progress Notes (Signed)
Discharge packet and medication education given with teach back.   Diet education given with teach back. Pt's questions and concerns were answered.  Peripheral iv removed, clean dry and intact, pressure and dressing applied.  Pt's inhaler picked up from pharmacy and white sheet sign and placed in pt's chart. Pt's belongings with patient: cell phone, glasses, clothes and shoes.   Patient was transported to [facility] entrance where pt's friend was waiting.

## 2019-05-15 NOTE — Plan of Care (Signed)
  Problem: Education: Goal: Knowledge of General Education information will improve Description: Including pain rating scale, medication(s)/side effects and non-pharmacologic comfort measures Outcome: Progressing   Problem: Health Behavior/Discharge Planning: Goal: Ability to manage health-related needs will improve Outcome: Progressing   Problem: Activity: Goal: Risk for activity intolerance will decrease Outcome: Progressing   

## 2019-05-16 ENCOUNTER — Telehealth: Payer: Self-pay | Admitting: Cardiovascular Disease

## 2019-05-16 NOTE — Telephone Encounter (Signed)
Correction pt was seen by Dr. Marlou Porch in the hospital

## 2019-05-16 NOTE — Telephone Encounter (Addendum)
Scheduled pt for July OV w/ [name] to re-establish cardiology care post hospital. Pt appreciates the help with this. Called pt back  and left detailed voicemail with office address/phone number per pt request.

## 2019-05-16 NOTE — Telephone Encounter (Signed)
  Patient called to schedule his hospital f/u and refused a virtual visit. He wants to come and be seen in the office.

## 2019-05-16 NOTE — Telephone Encounter (Signed)
Looks like pt last seen 2011

## 2019-06-08 ENCOUNTER — Telehealth: Payer: Self-pay | Admitting: Cardiovascular Disease

## 2019-06-08 NOTE — Telephone Encounter (Signed)

## 2019-06-10 ENCOUNTER — Ambulatory Visit (INDEPENDENT_AMBULATORY_CARE_PROVIDER_SITE_OTHER): Payer: Medicare HMO | Admitting: Cardiovascular Disease

## 2019-06-10 ENCOUNTER — Other Ambulatory Visit: Payer: Self-pay

## 2019-06-10 ENCOUNTER — Encounter: Payer: Self-pay | Admitting: Cardiovascular Disease

## 2019-06-10 VITALS — BP 150/86 | HR 69 | Ht 71.0 in | Wt 178.0 lb

## 2019-06-10 DIAGNOSIS — I10 Essential (primary) hypertension: Secondary | ICD-10-CM

## 2019-06-10 NOTE — Progress Notes (Signed)
Cardiology Office Note:    Date:  06/10/2019   ID:  Jose [name], DOB [birth year], MRN [id number]  PCP:  Margot Ableskwubunka-Anyim, Ahunna, MD  Cardiologist:  Allena KatzNishan , [name]  Electrophysiologist:  None   Referring MD: Margot Ableskwubunka-Anyim, Ahunna*   Chief Complaint  Patient presents with  . Hypertension  . Chest Pain     June 10, 2019    Pt placed on my schedule by mistake.   Has been seen by Dr. Eden EmmsNishan originally  and Dr. Rennis GoldenHilty, Dr. Katrinka BlazingSmith  in June, 2020 .  I last saw him in 2013.   Jose [name] is a 58 y.o. male with a hx of cocaine abuse,  HTN .  He was recently admitted to the hospital with episodes of chest pain.  He had very low troponin level.  He was noted to have hypertension.  He is UDS was positive for cocaine.  Has cut his alcohol intake .  Was admitted with hyponatremia   ( was drinking lots of beer prior to admission )    Past Medical History:  Diagnosis Date  . Alcohol abuse   . CAD (coronary artery disease)    a. Reported MI in 2012 s/p 2 stents;  b. 08/2012 Cath: LM 20, LAD 20 diff ISR, jailed septal - 99%, LCX 30ost, RI large, min irregs, RCA 30p, 20-30 ISR-->Med Rx; c. 2017 [facility]  Marland Kitchen. CKD (chronic kidney disease), stage III (HCC)   . Cocaine abuse (HCC)   . COPD (chronic obstructive pulmonary disease) (HCC)   . Depression   . Diabetes mellitus without complication (HCC)   . Hyperlipidemia   . Hypertension   . Noncompliance     Past Surgical History:  Procedure Laterality Date  . CARDIAC CATHETERIZATION    . CORONARY STENT PLACEMENT    . LEFT HEART CATHETERIZATION WITH CORONARY ANGIOGRAM Bilateral 08/25/2012   Procedure: LEFT HEART CATHETERIZATION WITH CORONARY ANGIOGRAM;  Surgeon: Kathleene Hazelhristopher D McAlhany, MD;  [facility];  Service: Cardiovascular;  Laterality: Bilateral;    Current Medications: Current Meds  Medication Sig  . albuterol (PROVENTIL HFA;VENTOLIN HFA) 108 (90 BASE) MCG/ACT inhaler Inhale 2 puffs into the lungs every 6  (six) hours as needed for wheezing or shortness of breath.   Marland Kitchen. amLODipine (NORVASC) 5 MG tablet Take 1 tablet (5 mg total) by mouth daily.  Marland Kitchen. atorvastatin (LIPITOR) 40 MG tablet Take 1 tablet (40 mg total) by mouth daily at 6 PM.  . brimonidine (ALPHAGAN) 0.2 % ophthalmic solution Place 1 drop into both eyes 2 (two) times daily.   . budesonide-formoterol (SYMBICORT) 160-4.5 MCG/ACT inhaler Inhale 2 puffs into the lungs 2 (two) times daily.  . Cyanocobalamin (VITAMIN B-12 PO) Take 1 tablet by mouth daily.   Marland Kitchen. doxazosin (CARDURA) 8 MG tablet Take 4 mg by mouth at bedtime.  . ferrous sulfate 325 (65 FE) MG tablet Take 325 mg by mouth daily with breakfast.  . folic acid (FOLVITE) 1 MG tablet Take 1 tablet (1 mg total) by mouth daily.  . furosemide (LASIX) 40 MG tablet Take 1 tablet (40 mg total) by mouth every other day.  Marland Kitchen. glucose 4 GM chewable tablet Chew 4 g by mouth daily as needed for low blood sugar.   . hydrALAZINE (APRESOLINE) 10 MG tablet Take 1 tablet (10 mg total) by mouth every 8 (eight) hours.  . insulin aspart (NOVOLOG) 100 UNIT/ML injection Inject 0-9 Units into the skin 3 (three) times daily with meals.  .Marland Kitchen  insulin glargine (LANTUS) 100 UNIT/ML injection Inject 0.3 mLs (30 Units total) into the skin daily before breakfast.  . latanoprost (XALATAN) 0.005 % ophthalmic solution Place 1 drop into both eyes at bedtime.   . pantoprazole (PROTONIX) 40 MG tablet Take 1 tablet (40 mg total) by mouth daily at 6 (six) AM.  . thiamine 100 MG tablet Take 1 tablet (100 mg total) by mouth daily for 30 days.  . traZODone (DESYREL) 150 MG tablet Take 150 mg by mouth at bedtime.  . [DISCONTINUED] isosorbide mononitrate (IMDUR) 30 MG 24 hr tablet Take 0.5 tablets (15 mg total) by mouth daily.     Allergies:   Lisinopril-hydrochlorothiazide and Simvastatin   Social History   Socioeconomic History  . Marital status: Married    Spouse name: Not on file  . Number of children: Not on file  . Years of  education: Not on file  . Highest education level: Not on file  Occupational History  . Not on file  Social Needs  . Financial resource strain: Not on file  . Food insecurity    Worry: Not on file    Inability: Not on file  . Transportation needs    Medical: Not on file    Non-medical: Not on file  Tobacco Use  . Smoking status: Former Smoker    Types: Cigarettes    Quit date: 11/25/2007    Years since quitting: 11.5  . Smokeless tobacco: Never Used  Substance and Sexual Activity  . Alcohol use: Yes    Comment: Used to drink heavily - says currently 2 beers/day.  . Drug use: Yes    Types: Marijuana    Comment: reports not using cocaine  . Sexual activity: Yes    Birth control/protection: None  Lifestyle  . Physical activity    Days per week: Not on file    Minutes per session: Not on file  . Stress: Not on file  Relationships  . Social Herbalist on phone: Not on file    Gets together: Not on file    Attends religious service: Not on file    Active member of club or organization: Not on file    Attends meetings of clubs or organizations: Not on file    Relationship status: Not on file  Other Topics Concern  . Not on file  Social History Narrative   Lives in [place] by himself.  Does not work or routinely exercise.     Family History: The patient's family history includes Heart disease in his mother.  ROS:   Please see the history of present illness.     All other systems reviewed and are negative.  EKGs/Labs/Other Studies Reviewed:    The following studies were reviewed today:   EKG:    Recent Labs: 04/27/2019: ALT 28 05/13/2019: Hemoglobin 13.4; Platelets 165; TSH 1.349 05/15/2019: BUN 18; Creatinine, Ser 1.47; Potassium 3.8; Sodium 133  Recent Lipid Panel    Component Value Date/Time   CHOL 129 08/26/2012 0550   TRIG 98 08/26/2012 0550   HDL 37 (L) 08/26/2012 0550   CHOLHDL 3.5 08/26/2012 0550   VLDL 20 08/26/2012 0550   LDLCALC 72 08/26/2012  0550    Physical Exam:    VS:  BP (!) 150/86   Pulse 69   Ht 5\' 11"  (1.803 m)   Wt 178 lb (80.7 kg)   SpO2 98%   BMI 24.83 kg/m     Wt Readings from Last 3  Encounters:  06/10/19 178 lb (80.7 kg)  05/15/19 174 lb 8 oz (79.2 kg)  04/27/19 183 lb (83 kg)     GEN:  Well nourished, well developed in no acute distress HEENT: Normal NECK: No JVD; No carotid bruits LYMPHATICS: No lymphadenopathy CARDIAC:  RR  ,  Mid systolic click.  1-2 / 6 systolic murmur  C/w MVP  RESPIRATORY:  Clear to auscultation without rales, wheezing or rhonchi  ABDOMEN: Soft, non-tender, non-distended MUSCULOSKELETAL:  No edema; No deformity  SKIN: Warm and dry NEUROLOGIC:  Alert and oriented x 3 PSYCHIATRIC:  Normal affect   ASSESSMENT:    1. Essential hypertension    PLAN:    In order of problems listed above:  1. Chest pain :    Thinks the Imdur may be causing some lightheadedness.  Will DC ,  He asked if he could try his viagra again .   Should be ok after 1 week of not taking the imdur   he has had reduced LV function but this was in the setting of cocaine abuse . No angina recently   2, HTN:    BP is improving  .  3.  Cocaine:    Is committed to stopping .   None in 3 weeks.   Will see back in 3 months    Medication Adjustments/Labs and Tests Ordered: Current medicines are reviewed at length with the patient today.  Concerns regarding medicines are outlined above.  No orders of the defined types were placed in this encounter.  No orders of the defined types were placed in this encounter.    Patient Instructions  Medication Instructions:  Your physician has recommended you make the following change in your medication:   Discontinue Imdur  Labwork: None ordered.  Testing/Procedures: None ordered.  Follow-Up: Your physician recommends that you schedule a follow-up appointment in:   3 months with Dr. Elease HashimotoNahser  Any Other Special Instructions Will Be Listed Below (If  Applicable).     If you need a refill on your cardiac medications before your next appointment, please call your pharmacy.      Signed, Kristeen MissPhilip [name], MD  06/10/2019 12:56 PM    [facility]

## 2019-06-10 NOTE — Patient Instructions (Signed)
Medication Instructions:  Your physician has recommended you make the following change in your medication:   Discontinue Imdur  Labwork: None ordered.  Testing/Procedures: None ordered.  Follow-Up: Your physician recommends that you schedule a follow-up appointment in:   3 months with Dr. Acie Fredrickson  Any Other Special Instructions Will Be Listed Below (If Applicable).     If you need a refill on your cardiac medications before your next appointment, please call your pharmacy.

## 2019-06-24 DIAGNOSIS — R6889 Other general symptoms and signs: Secondary | ICD-10-CM | POA: Diagnosis not present

## 2019-06-27 DIAGNOSIS — R6889 Other general symptoms and signs: Secondary | ICD-10-CM | POA: Diagnosis not present

## 2019-09-08 ENCOUNTER — Ambulatory Visit (INDEPENDENT_AMBULATORY_CARE_PROVIDER_SITE_OTHER): Payer: Medicare HMO | Admitting: Cardiovascular Disease

## 2019-09-08 ENCOUNTER — Encounter: Payer: Self-pay | Admitting: Cardiovascular Disease

## 2019-09-08 ENCOUNTER — Other Ambulatory Visit: Payer: Self-pay

## 2019-09-08 VITALS — BP 164/86 | HR 74 | Ht 71.0 in | Wt 185.8 lb

## 2019-09-08 DIAGNOSIS — R6889 Other general symptoms and signs: Secondary | ICD-10-CM | POA: Diagnosis not present

## 2019-09-08 DIAGNOSIS — I2510 Atherosclerotic heart disease of native coronary artery without angina pectoris: Secondary | ICD-10-CM | POA: Diagnosis not present

## 2019-09-08 MED ORDER — HYDRALAZINE HCL 25 MG PO TABS: 25.0000 mg | ORAL_TABLET | Freq: Three times a day (TID) | ORAL | 3 refills | Status: DC

## 2019-09-08 NOTE — Patient Instructions (Signed)
Medication Instructions:  Your physician has recommended you make the following change in your medication: INCREASE Hydralazine to 25 mg three times per day  *If you need a refill on your cardiac medications before your next appointment, please call your pharmacy*   Lab Work: None Ordered    Testing/Procedures: None Ordered   Follow-Up: At [facility], you and your health needs are our priority.  As part of our continuing mission to provide you with exceptional heart care, we have created designated Provider Care Teams.  These Care Teams include your primary Cardiologist (physician) and Advanced Practice Providers (APPs -  Physician Assistants and Nurse Practitioners) who all work together to provide you with the care you need, when you need it.  Your next appointment:   6 months  The format for your next appointment:   In Person  Provider:   You may see Mertie Moores, MD or one of the following Advanced Practice Providers on your designated Care Team:    Richardson Dopp, PA-C  [place], [place]  Daune Perch, [place]

## 2019-09-08 NOTE — Progress Notes (Signed)
Cardiology Office Note:    Date:  09/08/2019   ID:  Jose [name], DOB 1961/07/04, MRN [id number]  PCP:  Jose Han, MD  Cardiologist:  Jose [name]  Electrophysiologist:  None   Referring MD: Jose [name]*   Chief Complaint  Patient presents with  . Hypertension     June 10, 2019    Pt placed on my schedule by mistake.   Has been seen by Jose [name] originally  and Jose [name], Jose [name]  in June, 2020 .  I last saw him in 2013.   Jose [name] is a 58 y.o. male with a hx of cocaine abuse,  HTN .  He was recently admitted to the hospital with episodes of chest pain.  He had very low troponin level.  He was noted to have hypertension.  He is UDS was positive for cocaine.  Has cut his alcohol intake .  Was admitted with hyponatremia   ( was drinking lots of beer prior to admission )   September 08, 2019:  Jose is seen today for follow-up visit.  He has a history of hypertension, cocaine abuse, alcohol abuse.  Not getting any exercise but is walking .  Has generalized fatigue  Echo from June, 2020 shows normal LV function , grade 1 diastolic dysfunction  BP is elevated today .  Only took amlodipine this am   Still eats liver pudding in the am.  Avoiding salt for the most part   He stopped his cardura because it caused dizziness.     Past Medical History:  Diagnosis Date  . Alcohol abuse   . CAD (coronary artery disease)    a. Reported MI in 2012 s/p 2 stents;  b. 08/2012 Cath: LM 20, LAD 20 diff ISR, jailed septal - 99%, LCX 30ost, RI large, min irregs, RCA 30p, 20-30 ISR-->Med Rx; c. 2017 [facility].  Marland Kitchen CKD (chronic kidney disease), stage III   . Cocaine abuse ([place])   . COPD (chronic obstructive pulmonary disease) ([place])   . Depression   . Diabetes mellitus without complication ([place])   . Hyperlipidemia   . Hypertension   . Noncompliance     Past Surgical History:  Procedure Laterality Date  . CARDIAC CATHETERIZATION    .  CORONARY STENT PLACEMENT    . LEFT HEART CATHETERIZATION WITH CORONARY ANGIOGRAM Bilateral 08/25/2012   Procedure: LEFT HEART CATHETERIZATION WITH CORONARY ANGIOGRAM;  Surgeon: Burnell Blanks, MD;  Location: [facility] CATH LAB;  Service: Cardiovascular;  Laterality: Bilateral;    Current Medications: Current Meds  Medication Sig  . albuterol (PROVENTIL HFA;VENTOLIN HFA) 108 (90 BASE) MCG/ACT inhaler Inhale 2 puffs into the lungs every 6 (six) hours as needed for wheezing or shortness of breath.   Marland Kitchen amLODipine (NORVASC) 5 MG tablet Take 1 tablet (5 mg total) by mouth daily.  Marland Kitchen atorvastatin (LIPITOR) 40 MG tablet Take 1 tablet (40 mg total) by mouth daily at 6 PM.  . brimonidine (ALPHAGAN) 0.2 % ophthalmic solution Place 1 drop into both eyes 2 (two) times daily.   . budesonide-formoterol (SYMBICORT) 160-4.5 MCG/ACT inhaler Inhale 2 puffs into the lungs 2 (two) times daily.  . Cyanocobalamin (VITAMIN B-12 PO) Take 1 tablet by mouth daily.   . ferrous sulfate 325 (65 FE) MG tablet Take 325 mg by mouth daily with breakfast.  . folic acid (FOLVITE) 1 MG tablet Take 1 tablet (1 mg total) by mouth daily.  . furosemide (LASIX) 40 MG  tablet Take 1 tablet (40 mg total) by mouth every other day.  Marland Kitchen glucose 4 GM chewable tablet Chew 4 g by mouth daily as needed for low blood sugar.   . insulin aspart (NOVOLOG) 100 UNIT/ML injection Inject 0-9 Units into the skin 3 (three) times daily with meals.  . insulin glargine (LANTUS) 100 UNIT/ML injection Inject 0.3 mLs (30 Units total) into the skin daily before breakfast.  . latanoprost (XALATAN) 0.005 % ophthalmic solution Place 1 drop into both eyes at bedtime.   . traZODone (DESYREL) 150 MG tablet Take 75 mg by mouth at bedtime.   . [DISCONTINUED] hydrALAZINE (APRESOLINE) 10 MG tablet Take 1 tablet (10 mg total) by mouth every 8 (eight) hours.     Allergies:   Lisinopril-hydrochlorothiazide and Simvastatin   Social History   Socioeconomic History  .  Marital status: Married    Spouse name: Not on file  . Number of children: Not on file  . Years of education: Not on file  . Highest education level: Not on file  Occupational History  . Not on file  Social Needs  . Financial resource strain: Not on file  . Food insecurity    Worry: Not on file    Inability: Not on file  . Transportation needs    Medical: Not on file    Non-medical: Not on file  Tobacco Use  . Smoking status: Former Smoker    Types: Cigarettes    Quit date: 11/25/2007    Years since quitting: 11.7  . Smokeless tobacco: Never Used  Substance and Sexual Activity  . Alcohol use: Yes    Comment: Used to drink heavily - says currently 2 beers/day.  . Drug use: Yes    Types: Marijuana    Comment: reports not using cocaine  . Sexual activity: Yes    Birth control/protection: None  Lifestyle  . Physical activity    Days per week: Not on file    Minutes per session: Not on file  . Stress: Not on file  Relationships  . Social Musician on phone: Not on file    Gets together: Not on file    Attends religious service: Not on file    Active member of club or organization: Not on file    Attends meetings of clubs or organizations: Not on file    Relationship status: Not on file  Other Topics Concern  . Not on file  Social History Narrative   Lives in [place] by himself.  Does not work or routinely exercise.     Family History: The patient's family history includes Heart disease in his mother.  ROS:   Please see the history of present illness.     All other systems reviewed and are negative.  EKGs/Labs/Other Studies Reviewed:    The following studies were reviewed today:   EKG:    Recent Labs: 04/27/2019: ALT 28 05/13/2019: Hemoglobin 13.4; Platelets 165; TSH 1.349 05/15/2019: BUN 18; Creatinine, Ser 1.47; Potassium 3.8; Sodium 133  Recent Lipid Panel    Component Value Date/Time   CHOL 129 08/26/2012 0550   TRIG 98 08/26/2012 0550   HDL 37  (L) 08/26/2012 0550   CHOLHDL 3.5 08/26/2012 0550   VLDL 20 08/26/2012 0550   LDLCALC 72 08/26/2012 0550     Physical Exam: Blood pressure (!) 164/86, pulse 74, height 5\' 11"  (1.803 m), weight 185 lb 12.8 oz (84.3 kg), SpO2 98 %.  GEN:  Well nourished,  well developed in no acute distress HEENT: Normal NECK: No JVD; No carotid bruits LYMPHATICS: No lymphadenopathy CARDIAC: RRR , no murmurs, rubs, gallops RESPIRATORY:  Clear to auscultation without rales, wheezing or rhonchi  ABDOMEN: Soft, non-tender, non-distended MUSCULOSKELETAL:  No edema; No deformity  SKIN: Warm and dry NEUROLOGIC:  Alert and oriented x 3     ASSESSMENT:    No diagnosis found. PLAN:    In order of problems listed above:  Chest pain :    No further cp    2, HTN:     BP is elevated.   Still eating salty foods daily ( liver pudding )    we will increase his hydralazine to 25 mg 3 times a day.  Of advised him to stay away from processed meats such as liver putting.  3.  Cocaine:    Says he is avoiding cocaine .   Will see back in 3 months    Medication Adjustments/Labs and Tests Ordered: Current medicines are reviewed at length with the patient today.  Concerns regarding medicines are outlined above.  No orders of the defined types were placed in this encounter.  Meds ordered this encounter  Medications  . hydrALAZINE (APRESOLINE) 25 MG tablet    Sig: Take 1 tablet (25 mg total) by mouth 3 (three) times daily.    Dispense:  270 tablet    Refill:  3     Patient Instructions  Medication Instructions:  Your physician has recommended you make the following change in your medication: INCREASE Hydralazine to 25 mg three times per day  *If you need a refill on your cardiac medications before your next appointment, please call your pharmacy*   Lab Work: None Ordered    Testing/Procedures: None Ordered   Follow-Up: At [facility], you and your health needs are our priority.  As part of  our continuing mission to provide you with exceptional heart care, we have created designated Provider Care Teams.  These Care Teams include your primary Cardiologist (physician) and Advanced Practice Providers (APPs -  Physician Assistants and Nurse Practitioners) who all work together to provide you with the care you need, when you need it.  Your next appointment:   6 months  The format for your next appointment:   In Person  Provider:   You may see Kristeen MissPhilip , MD or one of the following Advanced Practice Providers on your designated Care Team:    Tereso NewcomerScott Weaver, PA-C  Vin TatitlekBhagat, [place]PA-C  Berton BonJanine Hammond, [place]       Signed, Kristeen MissPhilip , MD  09/08/2019 10:21 AM    [facility]

## 2019-09-12 MED ORDER — HYDRALAZINE HCL 25 MG PO TABS: 25.0000 mg | ORAL_TABLET | Freq: Three times a day (TID) | ORAL | 3 refills | Status: DC

## 2019-09-12 NOTE — Addendum Note (Signed)
Addended by: Emmaline Life on: 09/12/2019 02:23 PM   Modules accepted: Orders

## 2019-09-14 DIAGNOSIS — R6889 Other general symptoms and signs: Secondary | ICD-10-CM | POA: Diagnosis not present

## 2019-09-27 DIAGNOSIS — R6889 Other general symptoms and signs: Secondary | ICD-10-CM | POA: Diagnosis not present

## 2019-10-18 DIAGNOSIS — R6889 Other general symptoms and signs: Secondary | ICD-10-CM | POA: Diagnosis not present

## 2019-11-11 DIAGNOSIS — R6889 Other general symptoms and signs: Secondary | ICD-10-CM | POA: Diagnosis not present

## 2019-11-28 DIAGNOSIS — R6889 Other general symptoms and signs: Secondary | ICD-10-CM | POA: Diagnosis not present

## 2019-11-28 DIAGNOSIS — H524 Presbyopia: Secondary | ICD-10-CM | POA: Diagnosis not present

## 2019-11-28 DIAGNOSIS — H25813 Combined forms of age-related cataract, bilateral: Secondary | ICD-10-CM | POA: Diagnosis not present

## 2019-11-28 DIAGNOSIS — H401133 Primary open-angle glaucoma, bilateral, severe stage: Secondary | ICD-10-CM | POA: Diagnosis not present

## 2019-11-28 DIAGNOSIS — E113411 Type 2 diabetes mellitus with severe nonproliferative diabetic retinopathy with macular edema, right eye: Secondary | ICD-10-CM | POA: Diagnosis not present

## 2019-12-06 DIAGNOSIS — H2589 Other age-related cataract: Secondary | ICD-10-CM | POA: Diagnosis not present

## 2019-12-06 DIAGNOSIS — H25811 Combined forms of age-related cataract, right eye: Secondary | ICD-10-CM | POA: Diagnosis not present

## 2019-12-06 DIAGNOSIS — H409 Unspecified glaucoma: Secondary | ICD-10-CM | POA: Diagnosis not present

## 2019-12-06 DIAGNOSIS — H5704 Mydriasis: Secondary | ICD-10-CM | POA: Diagnosis not present

## 2019-12-06 DIAGNOSIS — E11311 Type 2 diabetes mellitus with unspecified diabetic retinopathy with macular edema: Secondary | ICD-10-CM | POA: Diagnosis not present

## 2019-12-13 DIAGNOSIS — R6889 Other general symptoms and signs: Secondary | ICD-10-CM | POA: Diagnosis not present

## 2019-12-20 DIAGNOSIS — H2589 Other age-related cataract: Secondary | ICD-10-CM | POA: Diagnosis not present

## 2019-12-20 DIAGNOSIS — H25812 Combined forms of age-related cataract, left eye: Secondary | ICD-10-CM | POA: Diagnosis not present

## 2019-12-29 DIAGNOSIS — R6889 Other general symptoms and signs: Secondary | ICD-10-CM | POA: Diagnosis not present

## 2020-01-10 DIAGNOSIS — R6889 Other general symptoms and signs: Secondary | ICD-10-CM | POA: Diagnosis not present

## 2020-01-16 DIAGNOSIS — H3581 Retinal edema: Secondary | ICD-10-CM | POA: Diagnosis not present

## 2020-01-16 DIAGNOSIS — H3500 Unspecified background retinopathy: Secondary | ICD-10-CM | POA: Diagnosis not present

## 2020-01-17 DIAGNOSIS — R6889 Other general symptoms and signs: Secondary | ICD-10-CM | POA: Diagnosis not present

## 2020-02-09 DIAGNOSIS — R6889 Other general symptoms and signs: Secondary | ICD-10-CM | POA: Diagnosis not present

## 2020-06-14 DIAGNOSIS — R6889 Other general symptoms and signs: Secondary | ICD-10-CM | POA: Diagnosis not present

## 2020-06-16 ENCOUNTER — Emergency Department (HOSPITAL_COMMUNITY): Payer: No Typology Code available for payment source

## 2020-06-16 ENCOUNTER — Emergency Department (HOSPITAL_COMMUNITY)
Admission: EM | Admit: 2020-06-16 | Discharge: 2020-06-16 | Disposition: A | Discharge status: Home / Self Care | Payer: No Typology Code available for payment source | Attending: Emergency Medicine | Admitting: Emergency Medicine

## 2020-06-16 ENCOUNTER — Encounter (HOSPITAL_COMMUNITY): Payer: Self-pay

## 2020-06-16 DIAGNOSIS — R079 Chest pain, unspecified: Secondary | ICD-10-CM | POA: Diagnosis not present

## 2020-06-16 DIAGNOSIS — J449 Chronic obstructive pulmonary disease, unspecified: Secondary | ICD-10-CM | POA: Insufficient documentation

## 2020-06-16 DIAGNOSIS — I10 Essential (primary) hypertension: Secondary | ICD-10-CM | POA: Diagnosis not present

## 2020-06-16 DIAGNOSIS — E1165 Type 2 diabetes mellitus with hyperglycemia: Secondary | ICD-10-CM | POA: Diagnosis not present

## 2020-06-16 DIAGNOSIS — I129 Hypertensive chronic kidney disease with stage 1 through stage 4 chronic kidney disease, or unspecified chronic kidney disease: Secondary | ICD-10-CM | POA: Insufficient documentation

## 2020-06-16 DIAGNOSIS — I2510 Atherosclerotic heart disease of native coronary artery without angina pectoris: Secondary | ICD-10-CM | POA: Diagnosis not present

## 2020-06-16 DIAGNOSIS — Z7951 Long term (current) use of inhaled steroids: Secondary | ICD-10-CM | POA: Insufficient documentation

## 2020-06-16 DIAGNOSIS — Z79899 Other long term (current) drug therapy: Secondary | ICD-10-CM | POA: Diagnosis not present

## 2020-06-16 DIAGNOSIS — E1122 Type 2 diabetes mellitus with diabetic chronic kidney disease: Secondary | ICD-10-CM | POA: Insufficient documentation

## 2020-06-16 DIAGNOSIS — R05 Cough: Secondary | ICD-10-CM | POA: Insufficient documentation

## 2020-06-16 DIAGNOSIS — N183 Chronic kidney disease, stage 3 unspecified: Secondary | ICD-10-CM | POA: Diagnosis not present

## 2020-06-16 DIAGNOSIS — Z955 Presence of coronary angioplasty implant and graft: Secondary | ICD-10-CM | POA: Insufficient documentation

## 2020-06-16 DIAGNOSIS — R0602 Shortness of breath: Secondary | ICD-10-CM | POA: Insufficient documentation

## 2020-06-16 DIAGNOSIS — E871 Hypo-osmolality and hyponatremia: Secondary | ICD-10-CM | POA: Diagnosis not present

## 2020-06-16 DIAGNOSIS — Z87891 Personal history of nicotine dependence: Secondary | ICD-10-CM | POA: Diagnosis not present

## 2020-06-16 DIAGNOSIS — Z794 Long term (current) use of insulin: Secondary | ICD-10-CM | POA: Insufficient documentation

## 2020-06-16 DIAGNOSIS — R0789 Other chest pain: Secondary | ICD-10-CM | POA: Insufficient documentation

## 2020-06-16 DIAGNOSIS — Z20822 Contact with and (suspected) exposure to covid-19: Secondary | ICD-10-CM | POA: Diagnosis not present

## 2020-06-16 LAB — BASIC METABOLIC PANEL
Anion gap: 14 (ref 5–15)
BUN: 18 mg/dL (ref 8–23)
CO2: 15 mmol/L — ABNORMAL LOW (ref 22–32)
Calcium: 9.6 mg/dL (ref 8.9–10.3)
Chloride: 91 mmol/L — ABNORMAL LOW (ref 98–111)
Creatinine, Ser: 1.53 mg/dL — ABNORMAL HIGH (ref 0.61–1.24)
GFR calc Af Amer: 56 mL/min — ABNORMAL LOW (ref >60)
GFR calc non Af Amer: 48 mL/min — ABNORMAL LOW (ref >60)
Glucose, Bld: 329 mg/dL — ABNORMAL HIGH (ref 70–99)
Potassium: 4.3 mmol/L (ref 3.5–5.1)
Sodium: 120 mmol/L — ABNORMAL LOW (ref 135–145)

## 2020-06-16 LAB — CBC
HCT: 38.6 % — ABNORMAL LOW (ref 39.0–52.0)
Hemoglobin: 14 g/dL (ref 13.0–17.0)
MCH: 27.7 pg (ref 26.0–34.0)
MCHC: 36.3 g/dL — ABNORMAL HIGH (ref 30.0–36.0)
MCV: 76.4 fL — ABNORMAL LOW (ref 80.0–100.0)
Platelets: 170 10*3/uL (ref 150–400)
RBC: 5.05 MIL/uL (ref 4.22–5.81)
RDW: 13.3 % (ref 11.5–15.5)
WBC: 7.3 10*3/uL (ref 4.0–10.5)
nRBC: 0 % (ref 0.0–0.2)

## 2020-06-16 LAB — URINALYSIS, ROUTINE W REFLEX MICROSCOPIC
Bacteria, UA: NONE SEEN
Bilirubin Urine: NEGATIVE
Glucose, UA: >=500 mg/dL — AB
Ketones, ur: 20 mg/dL — AB
Leukocytes,Ua: NEGATIVE
Nitrite: NEGATIVE
Protein, ur: 100 mg/dL — AB
Specific Gravity, Urine: 1.006 (ref 1.005–1.030)
pH: 7 (ref 5.0–8.0)

## 2020-06-16 LAB — ETHANOL: Alcohol, Ethyl (B): <10 mg/dL (ref <10)

## 2020-06-16 LAB — RAPID URINE DRUG SCREEN, HOSP PERFORMED
Amphetamines: NOT DETECTED
Barbiturates: NOT DETECTED
Benzodiazepines: NOT DETECTED
Cocaine: NOT DETECTED
Opiates: NOT DETECTED
Tetrahydrocannabinol: POSITIVE — AB

## 2020-06-16 LAB — D-DIMER, QUANTITATIVE: D-Dimer, Quant: 0.74 ug/mL-FEU — ABNORMAL HIGH (ref 0.00–0.50)

## 2020-06-16 LAB — TROPONIN I (HIGH SENSITIVITY)
Troponin I (High Sensitivity): 28 ng/L — ABNORMAL HIGH (ref <18)
Troponin I (High Sensitivity): 22 ng/L — ABNORMAL HIGH (ref <18)

## 2020-06-16 LAB — SARS CORONAVIRUS 2 BY RT PCR (HOSPITAL ORDER, PERFORMED IN ~~LOC~~ HOSPITAL LAB): SARS Coronavirus 2: NEGATIVE

## 2020-06-16 MED ORDER — ALBUTEROL SULFATE HFA 108 (90 BASE) MCG/ACT IN AERS
2.0000 | INHALATION_SPRAY | Freq: Once | RESPIRATORY_TRACT | Status: AC
  Administered 2020-06-16: 2 via RESPIRATORY_TRACT
  Filled 2020-06-16: qty 6.7

## 2020-06-16 MED ORDER — HYDRALAZINE HCL 10 MG PO TABS
10.0000 mg | ORAL_TABLET | Freq: Once | ORAL | Status: AC
  Administered 2020-06-16: 10 mg via ORAL
  Filled 2020-06-16: qty 1

## 2020-06-16 MED ORDER — IPRATROPIUM BROMIDE HFA 17 MCG/ACT IN AERS
2.0000 | INHALATION_SPRAY | Freq: Once | RESPIRATORY_TRACT | Status: AC
  Administered 2020-06-16: 2 via RESPIRATORY_TRACT
  Filled 2020-06-16: qty 12.9

## 2020-06-16 MED ORDER — IOHEXOL 350 MG/ML SOLN
100.0000 mL | Freq: Once | INTRAVENOUS | Status: AC | PRN
  Administered 2020-06-16: 100 mL via INTRAVENOUS

## 2020-06-16 MED ORDER — LORAZEPAM 1 MG PO TABS: 0.0000 mg | ORAL_TABLET | Freq: Four times a day (QID) | ORAL | Status: DC

## 2020-06-16 MED ORDER — SODIUM CHLORIDE 0.9% FLUSH
3.0000 mL | Freq: Once | INTRAVENOUS | Status: AC
  Administered 2020-06-16: 3 mL via INTRAVENOUS

## 2020-06-16 MED ORDER — ONDANSETRON HCL 4 MG/2ML IJ SOLN
4.0000 mg | Freq: Once | INTRAMUSCULAR | Status: AC
  Administered 2020-06-16: 4 mg via INTRAVENOUS
  Filled 2020-06-16: qty 2

## 2020-06-16 MED ORDER — MORPHINE SULFATE (PF) 4 MG/ML IV SOLN
4.0000 mg | Freq: Once | INTRAVENOUS | Status: AC
  Administered 2020-06-16: 4 mg via INTRAVENOUS
  Filled 2020-06-16: qty 1

## 2020-06-16 MED ORDER — PREDNISONE 10 MG (21) PO TBPK
ORAL_TABLET | ORAL | 0 refills | Status: DC
Start: 2020-06-16 — End: 2020-08-24

## 2020-06-16 MED ORDER — THIAMINE HCL 100 MG PO TABS: 100.0000 mg | ORAL_TABLET | Freq: Every day | ORAL | Status: DC

## 2020-06-16 MED ORDER — PREDNISONE 20 MG PO TABS
60.0000 mg | ORAL_TABLET | Freq: Once | ORAL | Status: AC
  Administered 2020-06-16: 60 mg via ORAL
  Filled 2020-06-16: qty 3

## 2020-06-16 MED ORDER — LORAZEPAM 2 MG/ML IJ SOLN: 0.0000 mg | Freq: Two times a day (BID) | INTRAMUSCULAR | Status: DC

## 2020-06-16 MED ORDER — LORAZEPAM 1 MG PO TABS: 0.0000 mg | ORAL_TABLET | Freq: Two times a day (BID) | ORAL | Status: DC

## 2020-06-16 MED ORDER — THIAMINE HCL 100 MG/ML IJ SOLN
100.0000 mg | Freq: Every day | INTRAMUSCULAR | Status: DC
  Administered 2020-06-16: 100 mg via INTRAVENOUS
  Filled 2020-06-16: qty 2

## 2020-06-16 MED ORDER — LORAZEPAM 2 MG/ML IJ SOLN: 0.0000 mg | Freq: Four times a day (QID) | INTRAMUSCULAR | Status: DC

## 2020-06-16 MED ORDER — ONDANSETRON 4 MG PO TBDP
4.0000 mg | ORAL_TABLET | Freq: Three times a day (TID) | ORAL | 0 refills | Status: DC | PRN
Start: 2020-06-16 — End: 2020-09-03

## 2020-06-16 NOTE — ED Provider Notes (Signed)
[facility] EMERGENCY DEPARTMENT Provider Note   CSN: [id number] Arrival date & time: 06/16/20  1449     History Chief Complaint  Patient presents with  . Chest Pain    Marke Goodwyn is a 59 y.o. male.  HPI     Plez Belton is a 59 y.o. male, with a history of alcohol abuse, CAD, CKD stage III,, presenting to the ED with chest pain over the last 2 days.  Patient states he thinks he woke up with the pain. Pain is in the left chest, described as cramping, severe, nonradiating, worse with deep breathing. Increased cough.  Nausea and vomiting.  Generalized weakness, tremoring, sweating. Patient additionally states he has recently tried to cut down on his drinking.  He states he was drinking a sixpack of beer daily and has cut back to 2-3 beers daily.  He is unable to tell me when he started doing this. Denies history of PE/DVT.  Denies fever/chills, diarrhea, abdominal pain, lower extremity edema/pain, orthopnea, syncope, dizziness, or any other complaints.    Past Medical History:  Diagnosis Date  . Alcohol abuse   . CAD (coronary artery disease)    a. Reported MI in 2012 s/p 2 stents;  b. 08/2012 Cath: LM 20, LAD 20 diff ISR, jailed septal - 99%, LCX 30ost, RI large, min irregs, RCA 30p, 20-30 ISR-->Med Rx; c. 2017 Pt reports Neg [facility] test @ [facility].  Marland Kitchen CKD (chronic kidney disease), stage III   . Cocaine abuse (HCC)   . COPD (chronic obstructive pulmonary disease) (HCC)   . Depression   . Diabetes mellitus without complication (HCC)   . Hyperlipidemia   . Hypertension   . Noncompliance     Patient Active Problem List   Diagnosis Date Noted  . Elevated troponin   . Nausea and vomiting   . Suspected COVID-19 virus infection   . Diarrhea 04/27/2019  . Hypertensive retinopathy of both eyes 02/01/2019  . Chest pain 08/30/2017  . Chest pain with high risk for cardiac etiology 08/28/2017  . CKD (chronic kidney disease) stage 3, GFR 30-59 ml/min 08/28/2017  .  COPD (chronic obstructive pulmonary disease) (HCC) 08/28/2017  . CAD (coronary artery disease) 04/10/2016  . Dyslipidemia 04/10/2016  . Benign prostatic hyperplasia 04/10/2016  . Diabetes (HCC) 04/10/2016  . Hyperglycemia 10/17/2015  . Uncontrolled type 2 diabetes mellitus with diabetic nephropathy, with long-term current use of insulin (HCC) 10/17/2015  . Acute renal failure superimposed on stage 3 chronic kidney disease (HCC) 10/17/2015  . Hyperkalemia 10/17/2015  . Dyslipidemia associated with type 2 diabetes mellitus (HCC) 10/17/2015  . Alcohol abuse   . Cocaine abuse (HCC) 06/24/2015  . Hyponatremia 05/12/2015  . Hypertension     Past Surgical History:  Procedure Laterality Date  . CARDIAC CATHETERIZATION    . CORONARY STENT PLACEMENT    . LEFT HEART CATHETERIZATION WITH CORONARY ANGIOGRAM Bilateral 08/25/2012   Procedure: LEFT HEART CATHETERIZATION WITH CORONARY ANGIOGRAM;  Surgeon: Kathleene Hazel, MD;  Location: [facility] CATH LAB;  Service: Cardiovascular;  Laterality: Bilateral;       Family History  Problem Relation Age of Onset  . Heart disease Mother        MI 64s    Social History   Tobacco Use  . Smoking status: Former Smoker    Types: Cigarettes    Quit date: 11/25/2007    Years since quitting: 12.5  . Smokeless tobacco: Never Used  Vaping Use  . Vaping Use: Never used  Substance Use Topics  . Alcohol use: Yes    Comment: Used to drink heavily - says currently 2 beers/day.  . Drug use: Yes    Types: Marijuana    Comment: reports not using cocaine    Home Medications Prior to Admission medications   Medication Sig Start Date End Date Taking? Authorizing Provider  albuterol (PROVENTIL HFA;VENTOLIN HFA) 108 (90 BASE) MCG/ACT inhaler Inhale 2 puffs into the lungs every 6 (six) hours as needed for wheezing or shortness of breath.     [provider]  amLODipine (NORVASC) 5 MG tablet Take 1 tablet (5 mg total) by mouth daily. 05/01/19   Rhetta Mura, MD  atorvastatin (LIPITOR) 40 MG tablet Take 1 tablet (40 mg total) by mouth daily at 6 PM. 04/30/19   Rhetta Mura, MD  brimonidine (ALPHAGAN) 0.2 % ophthalmic solution Place 1 drop into both eyes 2 (two) times daily.  04/11/15   [provider]  budesonide-formoterol (SYMBICORT) 160-4.5 MCG/ACT inhaler Inhale 2 puffs into the lungs 2 (two) times daily.    [provider]  Cyanocobalamin (VITAMIN B-12 PO) Take 1 tablet by mouth daily.     [provider]  ferrous sulfate 325 (65 FE) MG tablet Take 325 mg by mouth daily with breakfast.    [provider]  folic acid (FOLVITE) 1 MG tablet Take 1 tablet (1 mg total) by mouth daily. 04/30/19   Rhetta Mura, MD  furosemide (LASIX) 40 MG tablet Take 1 tablet (40 mg total) by mouth every other day. 04/30/19   Rhetta Mura, MD  glucose 4 GM chewable tablet Chew 4 g by mouth daily as needed for low blood sugar.     [provider]  hydrALAZINE (APRESOLINE) 25 MG tablet Take 1 tablet (25 mg total) by mouth 3 (three) times daily. 09/12/19 09/06/20  [name], Deloris Ping, MD  insulin aspart (NOVOLOG) 100 UNIT/ML injection Inject 0-9 Units into the skin 3 (three) times daily with meals. 07/25/15   Kathlen Mody, MD  insulin glargine (LANTUS) 100 UNIT/ML injection Inject 0.3 mLs (30 Units total) into the skin daily before breakfast. 08/30/17   Zannie Cove, MD  latanoprost (XALATAN) 0.005 % ophthalmic solution Place 1 drop into both eyes at bedtime.  04/11/15   [provider]  ondansetron (ZOFRAN ODT) 4 MG disintegrating tablet Take 1 tablet (4 mg total) by mouth every 8 (eight) hours as needed for nausea or vomiting. 06/16/20   Cindee Mclester C, PA-C  predniSONE (STERAPRED UNI-PAK 21 TAB) 10 MG (21) TBPK tablet Take 6 tabs (60mg ) day 1, 5 tabs (50mg ) day 2, 4 tabs (40mg ) day 3, 3 tabs (30mg ) day 4, 2 tabs (20mg ) day 5, and 1 tab (10mg ) day 6. 06/16/20   Samreet Edenfield C, PA-C  traZODone  (DESYREL) 150 MG tablet Take 75 mg by mouth at bedtime.     [provider]    Allergies    Lisinopril-hydrochlorothiazide and Simvastatin  Review of Systems   Review of Systems  Constitutional: Positive for diaphoresis. Negative for fever.  Respiratory: Positive for cough and shortness of breath.   Cardiovascular: Positive for chest pain. Negative for leg swelling.  Gastrointestinal: Positive for nausea and vomiting. Negative for abdominal pain and diarrhea.  Genitourinary: Negative for dysuria, flank pain and hematuria.  Musculoskeletal: Negative for back pain.  Neurological: Positive for weakness. Negative for syncope.  All other systems reviewed and are negative.   Physical Exam Updated Vital Signs BP (!) 192/79 (BP Location:  Left Arm)   Pulse 78   Temp 98.5 F (36.9 C)   Resp 20   SpO2 100%   Physical Exam Vitals and nursing note reviewed.  Constitutional:      General: He is not in acute distress.    Appearance: He is well-developed. He is not diaphoretic.  HENT:     Head: Normocephalic and atraumatic.     Mouth/Throat:     Mouth: Mucous membranes are moist.     Pharynx: Oropharynx is clear.  Eyes:     Conjunctiva/sclera: Conjunctivae normal.  Cardiovascular:     Rate and Rhythm: Regular rhythm. Tachycardia present.     Pulses: Normal pulses.          Radial pulses are 2+ on the right side and 2+ on the left side.       Posterior tibial pulses are 2+ on the right side and 2+ on the left side.     Heart sounds: Normal heart sounds.     Comments: Tactile temperature in the extremities appropriate and equal bilaterally. Pulmonary:     Effort: No respiratory distress.     Breath sounds: Normal breath sounds.     Comments: Some tachypnea and increased work of breathing. Abdominal:     Palpations: Abdomen is soft.     Tenderness: There is no abdominal tenderness. There is no guarding.  Musculoskeletal:     Cervical back: Neck supple.     Right lower  leg: No edema.     Left lower leg: No edema.  Lymphadenopathy:     Cervical: No cervical adenopathy.  Skin:    General: Skin is warm and dry.  Neurological:     Mental Status: He is alert.  Psychiatric:        Mood and Affect: Mood and affect normal.        Speech: Speech normal.        Behavior: Behavior normal.     ED Results / Procedures / Treatments   Labs (all labs ordered are listed, but only abnormal results are displayed) Labs Reviewed  BASIC METABOLIC PANEL - Abnormal; Notable for the following components:      Result Value   Sodium 120 (*)    Chloride 91 (*)    CO2 15 (*)    Glucose, Bld 329 (*)    Creatinine, Ser 1.53 (*)    GFR calc non Af Amer 48 (*)    GFR calc Af Amer 56 (*)    All other components within normal limits  CBC - Abnormal; Notable for the following components:   HCT 38.6 (*)    MCV 76.4 (*)    MCHC 36.3 (*)    All other components within normal limits  D-DIMER, QUANTITATIVE (NOT AT [facility]) - Abnormal; Notable for the following components:   D-Dimer, Quant 0.74 (*)    All other components within normal limits  URINALYSIS, ROUTINE W REFLEX MICROSCOPIC - Abnormal; Notable for the following components:   Color, Urine STRAW (*)    Glucose, UA >=500 (*)    Hgb urine dipstick SMALL (*)    Ketones, ur 20 (*)    Protein, ur 100 (*)    All other components within normal limits  RAPID URINE DRUG SCREEN, HOSP PERFORMED - Abnormal; Notable for the following components:   Tetrahydrocannabinol POSITIVE (*)    All other components within normal limits  TROPONIN I (HIGH SENSITIVITY) - Abnormal; Notable for the following components:   Troponin I (  High Sensitivity) 28 (*)    All other components within normal limits  TROPONIN I (HIGH SENSITIVITY) - Abnormal; Notable for the following components:   Troponin I (High Sensitivity) 22 (*)    All other components within normal limits  SARS CORONAVIRUS 2 BY RT PCR (HOSPITAL ORDER, PERFORMED IN [facility]  LAB)  ETHANOL   BUN  Date Value Ref Range Status  06/16/2020 18 8 - 23 mg/dL Final  47/82/956206/21/2020 18 6 - 20 mg/dL Final  13/08/657806/20/2020 24 (H) 6 - 20 mg/dL Final  46/96/295206/19/2020 18 6 - 20 mg/dL Final   Creatinine, Ser  Date Value Ref Range Status  06/16/2020 1.53 (H) 0.61 - 1.24 mg/dL Final  84/13/244006/21/2020 1.021.47 (H) 0.61 - 1.24 mg/dL Final  72/53/664406/20/2020 0.341.70 (H) 0.61 - 1.24 mg/dL Final  74/25/956306/19/2020 8.751.50 (H) 0.61 - 1.24 mg/dL Final      EKG EKG Interpretation  Date/Time:  Saturday June 16 2020 15:00:48 EDT Ventricular Rate:  79 PR Interval:  192 QRS Duration: 88 QT Interval:  364 QTC Calculation: 417 R Axis:   63 Text Interpretation: Normal sinus rhythm Normal ECG Confirmed by Raeford RazorKohut, Stephen [phone]([id number]) on 06/16/2020 6:55:38 PM   Radiology DG Chest 2 View  Result Date: 06/16/2020 CLINICAL DATA:  Chest pain EXAM: CHEST - 2 VIEW COMPARISON:  05/13/2019 FINDINGS: The heart size and mediastinal contours are within normal limits. No acute airspace opacity. Unchanged, minimal bandlike scarring of the left midlung. The visualized skeletal structures are unremarkable. IMPRESSION: No acute abnormality of the lungs. Unchanged, minimal bandlike scarring of the left midlung. Electronically Signed   By: Lauralyn PrimesAlex  Bibbey M.D.   On: 06/16/2020 15:23   CT Angio Chest PE W and/or Wo Contrast  Result Date: 06/16/2020 CLINICAL DATA:  Pulmonary embolism, chest pain, dyspnea EXAM: CT ANGIOGRAPHY CHEST WITH CONTRAST TECHNIQUE: Multidetector CT imaging of the chest was performed using the standard protocol during bolus administration of intravenous contrast. Multiplanar CT image reconstructions and MIPs were obtained to evaluate the vascular anatomy. CONTRAST:  100mL OMNIPAQUE IOHEXOL 350 MG/ML SOLN COMPARISON:  None. FINDINGS: Cardiovascular: Satisfactory opacification of the pulmonary arteries to the segmental level. There are several small webs identified within the right lower lobar pulmonary artery which may reflect the  residua of prior pulmonary embolisms. There is no intraluminal filling defect identified, however, to suggest acute pulmonary embolization. The central pulmonary arteries are of normal caliber. Extensive coronary artery calcification is noted. Probable stenting of the of the left anterior descending and distal right coronary arteries. Global cardiac size is within normal limits. No pericardial effusion. Thoracic aorta is unremarkable. Mediastinum/Nodes: No enlarged mediastinal, hilar, or axillary lymph nodes. Thyroid gland, trachea, and esophagus demonstrate no significant findings. Lungs/Pleura: There is subtle bilateral upper lobe centrilobular nodularity and mild bronchial wall thickening. In the acute setting, this could be seen in atypical infection. If chronic, this can be seen with smoking related disease (RB-ILD) or hypersensitivity pneumonitis. No confluent pulmonary infiltrates are seen. No pneumothorax or pleural effusion. No central obstructing lesion. Upper Abdomen: Infiltrative soft tissue surrounding the distal common and proximal proper hepatic arteries as well as incompletely evaluated cystic lesion within the tail of the pancreas are unchanged from prior CT examination of the abdomen of 04/27/2019 and may represent the sequela of prior pancreatitis. This is not well assessed on this examination. Musculoskeletal: No acute bone abnormality Review of the MIP images confirms the above findings. IMPRESSION: No acute pulmonary embolism. Probable sequela of prior pulmonary embolization. Subtle centrilobular  nodularity and airway inflammation. See differential considerations above. Changes within the upper abdomen possibly related to prior pancreatitis. This is incompletely evaluated on this examination. Extensive coronary artery calcification. Electronically Signed   By: Helyn Numbers MD   On: 06/16/2020 20:41    Procedures Procedures (including critical care time)  Medications Ordered in  ED Medications  LORazepam (ATIVAN) injection 0-4 mg (0 mg Intravenous Not Given 06/16/20 2225)    Or  LORazepam (ATIVAN) tablet 0-4 mg ( Oral See Alternative 06/16/20 2225)  LORazepam (ATIVAN) injection 0-4 mg (has no administration in time range)    Or  LORazepam (ATIVAN) tablet 0-4 mg (has no administration in time range)  thiamine tablet 100 mg ( Oral See Alternative 06/16/20 1912)    Or  thiamine (B-1) injection 100 mg (100 mg Intravenous Given 06/16/20 1912)  albuterol (VENTOLIN HFA) 108 (90 Base) MCG/ACT inhaler 2 puff (has no administration in time range)  ipratropium (ATROVENT HFA) inhaler 2 puff (has no administration in time range)  predniSONE (DELTASONE) tablet 60 mg (has no administration in time range)  sodium chloride flush (NS) 0.9 % injection 3 mL (3 mLs Intravenous Given 06/16/20 1819)  ondansetron (ZOFRAN) injection 4 mg (4 mg Intravenous Given 06/16/20 1816)  morphine 4 MG/ML injection 4 mg (4 mg Intravenous Given 06/16/20 1816)  hydrALAZINE (APRESOLINE) tablet 10 mg (10 mg Oral Given 06/16/20 1817)  iohexol (OMNIPAQUE) 350 MG/ML injection 100 mL (100 mLs Intravenous Contrast Given 06/16/20 2019)  albuterol (VENTOLIN HFA) 108 (90 Base) MCG/ACT inhaler 2 puff (2 puffs Inhalation Given 06/16/20 2219)    ED Course  I have reviewed the triage vital signs and the nursing notes.  Pertinent labs & imaging results that were available during my care of the patient were reviewed by me and considered in my medical decision making (see chart for details).  Clinical Course as of Jun 16 2321  Sat Jun 16, 2020  1828 Accounting for the patient's hyperglycemia, corrects to about 125 mEq/L.  Sodium(!): 120 [SJ]    Clinical Course User Index [SJ] Talia Hoheisel, Hillard Danker, PA-C   MDM Rules/Calculators/A&P                          Patient presents complaining of chest pain, cough, and some shortness of breath. Patient is nontoxic appearing, afebrile, not tachycardic, not hypotensive, maintains  excellent SPO2 on room air, and is in no apparent distress.   I have reviewed the patient's chart to obtain more information.   I reviewed and interpreted the patient's labs and radiological studies. Mild tachypnea initially, improved during ED course. Troponins very mildly elevated, but flat.  EKG without evidence of ischemia.  Hyponatremia noted, but this patient seems to have chronic hyponatremia.  The patient was given instructions for home care as well as return precautions. Patient voices understanding of these instructions, accepts the plan, and is comfortable with discharge.   Findings and plan of care discussed with Raeford Razor, MD. Dr. Juleen China personally evaluated and examined this patient.  Vitals:   06/16/20 1945 06/16/20 1946 06/16/20 2030 06/16/20 2219  BP:  (!) 171/89  (!) 190/88  Pulse: 74 70 71 69  Resp:  18 17   Temp:      SpO2:  100% 100%      Final Clinical Impression(s) / ED Diagnoses Final diagnoses:  Atypical chest pain  Chronic hyponatremia    Rx / DC Orders ED Discharge Orders  Ordered    predniSONE (STERAPRED UNI-PAK 21 TAB) 10 MG (21) TBPK tablet     Discontinue  Reprint     06/16/20 2219    ondansetron (ZOFRAN ODT) 4 MG disintegrating tablet  Every 8 hours PRN     Discontinue  Reprint     06/16/20 2252           Anselm Pancoast, PA-C 06/16/20 2323    Raeford Razor, MD 06/17/20 1840

## 2020-06-16 NOTE — ED Notes (Signed)
Food provided

## 2020-06-16 NOTE — Discharge Instructions (Addendum)
  Prednisone: Take the prednisone, as prescribed, until finished. If you are a diabetic, please know prednisone can raise your blood sugar temporarily.  Nausea/vomiting: Use the ondansetron (generic for Zofran) for nausea or vomiting.  This medication may not prevent all vomiting or nausea, but can help facilitate better hydration. Things that can help with nausea/vomiting also include peppermint/menthol candies, vitamin B12, and ginger.  Recommend follow-up with primary care provider and cardiology for chest pain.  Follow-up with pulmonology for any further issues with breathing.  They will need to reassess your lungs for further underlying disease.  Your sodium was low.  It has been low quite a few times in the past.  It is important to follow-up with your primary care provider on this matter.  Return to the emergency department for new chest pain, increasing shortness of breath, uncontrolled vomiting, abdominal pain, or any other major concerns.

## 2020-06-16 NOTE — ED Triage Notes (Signed)
Pt arrives to ED via [facility] w/ c/o 6/10 sharp centrally located chest pain and sob onset 2 days ago w/ worsening today. Pt received 324mg  aspirin and 1 nitro w/ EMS. Pt refused further nitro. EMS VSS.

## 2020-07-19 DIAGNOSIS — R6889 Other general symptoms and signs: Secondary | ICD-10-CM | POA: Diagnosis not present

## 2020-08-07 ENCOUNTER — Encounter (HOSPITAL_COMMUNITY): Payer: Self-pay

## 2020-08-07 ENCOUNTER — Emergency Department (HOSPITAL_COMMUNITY)
Admission: EM | Admit: 2020-08-07 | Discharge: 2020-08-07 | Disposition: A | Discharge status: Home / Self Care | Payer: No Typology Code available for payment source | Attending: Emergency Medicine | Admitting: Emergency Medicine

## 2020-08-07 ENCOUNTER — Other Ambulatory Visit: Payer: Self-pay

## 2020-08-07 DIAGNOSIS — E1122 Type 2 diabetes mellitus with diabetic chronic kidney disease: Secondary | ICD-10-CM | POA: Diagnosis not present

## 2020-08-07 DIAGNOSIS — I129 Hypertensive chronic kidney disease with stage 1 through stage 4 chronic kidney disease, or unspecified chronic kidney disease: Secondary | ICD-10-CM | POA: Diagnosis not present

## 2020-08-07 DIAGNOSIS — N183 Chronic kidney disease, stage 3 unspecified: Secondary | ICD-10-CM | POA: Insufficient documentation

## 2020-08-07 DIAGNOSIS — E871 Hypo-osmolality and hyponatremia: Secondary | ICD-10-CM | POA: Diagnosis not present

## 2020-08-07 DIAGNOSIS — I2510 Atherosclerotic heart disease of native coronary artery without angina pectoris: Secondary | ICD-10-CM | POA: Diagnosis not present

## 2020-08-07 DIAGNOSIS — Z87891 Personal history of nicotine dependence: Secondary | ICD-10-CM | POA: Diagnosis not present

## 2020-08-07 DIAGNOSIS — R531 Weakness: Secondary | ICD-10-CM | POA: Diagnosis not present

## 2020-08-07 DIAGNOSIS — J449 Chronic obstructive pulmonary disease, unspecified: Secondary | ICD-10-CM | POA: Insufficient documentation

## 2020-08-07 DIAGNOSIS — R799 Abnormal finding of blood chemistry, unspecified: Secondary | ICD-10-CM | POA: Diagnosis present

## 2020-08-07 LAB — CBC
HCT: 39.4 % (ref 39.0–52.0)
Hemoglobin: 14.7 g/dL (ref 13.0–17.0)
MCH: 28.9 pg (ref 26.0–34.0)
MCHC: 37.3 g/dL — ABNORMAL HIGH (ref 30.0–36.0)
MCV: 77.4 fL — ABNORMAL LOW (ref 80.0–100.0)
Platelets: 180 10*3/uL (ref 150–400)
RBC: 5.09 MIL/uL (ref 4.22–5.81)
RDW: 12.9 % (ref 11.5–15.5)
WBC: 5.7 10*3/uL (ref 4.0–10.5)
nRBC: 0 % (ref 0.0–0.2)

## 2020-08-07 LAB — COMPREHENSIVE METABOLIC PANEL
ALT: 26 U/L (ref 0–44)
AST: 35 U/L (ref 15–41)
Albumin: 4.7 g/dL (ref 3.5–5.0)
Alkaline Phosphatase: 61 U/L (ref 38–126)
Anion gap: 13 (ref 5–15)
BUN: 17 mg/dL (ref 8–23)
CO2: 21 mmol/L — ABNORMAL LOW (ref 22–32)
Calcium: 9.9 mg/dL (ref 8.9–10.3)
Chloride: 92 mmol/L — ABNORMAL LOW (ref 98–111)
Creatinine, Ser: 1.28 mg/dL — ABNORMAL HIGH (ref 0.61–1.24)
GFR calc Af Amer: >60 mL/min (ref >60)
GFR calc non Af Amer: >60 mL/min (ref >60)
Glucose, Bld: 184 mg/dL — ABNORMAL HIGH (ref 70–99)
Potassium: 4.3 mmol/L (ref 3.5–5.1)
Sodium: 126 mmol/L — ABNORMAL LOW (ref 135–145)
Total Bilirubin: 0.6 mg/dL (ref 0.3–1.2)
Total Protein: 7.7 g/dL (ref 6.5–8.1)

## 2020-08-07 LAB — CBG MONITORING, ED: Glucose-Capillary: 210 mg/dL — ABNORMAL HIGH (ref 70–99)

## 2020-08-07 MED ORDER — ONDANSETRON HCL 4 MG/2ML IJ SOLN
4.0000 mg | Freq: Once | INTRAMUSCULAR | Status: AC
  Administered 2020-08-07: 4 mg via INTRAVENOUS
  Filled 2020-08-07: qty 2

## 2020-08-07 MED ORDER — SODIUM CHLORIDE 0.9 % IV BOLUS
1000.0000 mL | Freq: Once | INTRAVENOUS | Status: AC
  Administered 2020-08-07: 1000 mL via INTRAVENOUS

## 2020-08-07 NOTE — ED Provider Notes (Signed)
[facility]-EMERGENCY DEPT [facility] Emergency Department Provider Note MRN:  [id number]  Arrival date & time: 08/07/20     Chief Complaint   Abnormal Lab   History of Present Illness   Jose [name] is a 59 y.o. year-old male with a history of CAD, alcohol abuse, cocaine abuse, COPD, diabetes presenting to the ED with chief complaint of abnormal labs.  Patient sent from the [place] with very low sodium levels.  He was told that his sodium levels are low because of his drinking.  Drinks at least 6 beers per day.  Denies ever having alcohol withdrawal.  Last drink was 2 beers this morning.  Feels very weak, general malaise, tired.  Denies any pain, no headache or vision change, no chest pain or shortness of breath, no abdominal pain, no numbness or weakness to the arms or legs.  Review of Systems  A complete 10 system review of systems was obtained and all systems are negative except as noted in the HPI and PMH.   Patient's Health History    Past Medical History:  Diagnosis Date  . Alcohol abuse   . CAD (coronary artery disease)    a. Reported MI in 2012 s/p 2 stents;  b. 08/2012 Cath: LM 20, LAD 20 diff ISR, jailed septal - 99%, LCX 30ost, RI large, min irregs, RCA 30p, 20-30 ISR-->Med Rx; c. 2017 Pt reports [facility] @ [facility].  Marland Kitchen CKD (chronic kidney disease), stage III   . Cocaine abuse (HCC)   . COPD (chronic obstructive pulmonary disease) (HCC)   . Depression   . Diabetes mellitus without complication (HCC)   . Hyperlipidemia   . Hypertension   . Noncompliance     Past Surgical History:  Procedure Laterality Date  . CARDIAC CATHETERIZATION    . CORONARY STENT PLACEMENT    . LEFT HEART CATHETERIZATION WITH CORONARY ANGIOGRAM Bilateral 08/25/2012   Procedure: LEFT HEART CATHETERIZATION WITH CORONARY ANGIOGRAM;  Surgeon: Kathleene Hazel, MD;  Location: [facility] CATH LAB;  Service: Cardiovascular;  Laterality: Bilateral;    Family History  Problem Relation Age of Onset  . Heart  disease Mother        MI 39s    Social History   Socioeconomic History  . Marital status: Married    Spouse name: Not on file  . Number of children: Not on file  . Years of education: Not on file  . Highest education level: Not on file  Occupational History  . Not on file  Tobacco Use  . Smoking status: Former Smoker    Types: Cigarettes    Quit date: 11/25/2007    Years since quitting: 12.7  . Smokeless tobacco: Never Used  Vaping Use  . Vaping Use: Never used  Substance and Sexual Activity  . Alcohol use: Yes    Comment: Used to drink heavily - says currently 2 beers/day.  . Drug use: Yes    Types: Marijuana    Comment: reports not using cocaine  . Sexual activity: Yes    Birth control/protection: None  Other Topics Concern  . Not on file  Social History Narrative   Lives in [place] by himself.  Does not work or routinely exercise.   Social Determinants of Health   Financial Resource Strain:   . Difficulty of Paying Living Expenses: Not on file  Food Insecurity:   . Worried About Programme researcher, broadcasting/film/video in the Last Year: Not on file  . Ran Out of Food in the  Last Year: Not on file  Transportation Needs:   . Lack of Transportation (Medical): Not on file  . Lack of Transportation (Non-Medical): Not on file  Physical Activity:   . Days of Exercise per Week: Not on file  . Minutes of Exercise per Session: Not on file  Stress:   . Feeling of Stress : Not on file  Social Connections:   . Frequency of Communication with Friends and Family: Not on file  . Frequency of Social Gatherings with Friends and Family: Not on file  . Attends Religious Services: Not on file  . Active Member of Clubs or Organizations: Not on file  . Attends Banker Meetings: Not on file  . Marital Status: Not on file  Intimate Partner Violence:   . Fear of Current or Ex-Partner: Not on file  . Emotionally Abused: Not on file  . Physically Abused: Not on file  . Sexually Abused: Not on  file     Physical Exam   Vitals:   08/07/20 1650 08/07/20 1651  BP: (!) 200/11 (!) 196/117  Pulse: 91   Resp: 16   Temp:    SpO2: 99%     CONSTITUTIONAL: Well-appearing, NAD NEURO:  Alert and oriented x 3, no focal deficits EYES:  eyes equal and reactive ENT/NECK:  no LAD, no JVD CARDIO: Regular rate, well-perfused, normal S1 and S2 PULM:  CTAB no wheezing or rhonchi GI/GU:  normal bowel sounds, non-distended, non-tender MSK/SPINE:  No gross deformities, no edema SKIN:  no rash, atraumatic PSYCH:  Appropriate speech and behavior  *Additional and/or pertinent findings included in MDM below  Diagnostic and Interventional Summary    EKG Interpretation  Date/Time:  Tuesday August 07 2020 12:57:53 EDT Ventricular Rate:  76 PR Interval:    QRS Duration: 89 QT Interval:  370 QTC Calculation: 416 R Axis:   59 Text Interpretation: Sinus rhythm Anteroseptal infarct, old Borderline repolarization abnormality Baseline wander in lead(s) III No significant change since prior 7/21 Confirmed by Meridee Score [phone]) on 08/07/2020 1:09:50 PM      Labs Reviewed  CBC - Abnormal; Notable for the following components:      Result Value   MCV 77.4 (*)    MCHC 37.3 (*)    All other components within normal limits  COMPREHENSIVE METABOLIC PANEL - Abnormal; Notable for the following components:   Sodium 126 (*)    Chloride 92 (*)    CO2 21 (*)    Glucose, Bld 184 (*)    Creatinine, Ser 1.28 (*)    All other components within normal limits  CBG MONITORING, ED - Abnormal; Notable for the following components:   Glucose-Capillary 210 (*)    All other components within normal limits    No orders to display    Medications  sodium chloride 0.9 % bolus 1,000 mL (1,000 mLs Intravenous New Bag/Given 08/07/20 2100)  ondansetron (ZOFRAN) injection 4 mg (4 mg Intravenous Given 08/07/20 2100)     Procedures  /  Critical Care Procedures  ED Course and Medical Decision Making  I have  reviewed the triage vital signs, the nursing notes, and pertinent available records from the EMR.  Listed above are laboratory and imaging tests that I personally ordered, reviewed, and interpreted and then considered in my medical decision making (see below for details).  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  Not fluid overloaded on exam.  Overall with vague symptoms of malaise, fatigue, weakness.  Will provide IV fluids, nausea medicine, provide food, reassess.     Patient feeling much better, given liter of fluids, food, tolerating food well, requesting discharge.  No indication for admission, advised to continue to cut down on his alcohol.  Elmer Sow. Pilar Plate, MD [facility] Emergency Medicine [facility] [place] [facility] [email or web address]  Final Clinical Impressions(s) / ED Diagnoses     ICD-10-CM   1. Hyponatremia  E87.1   2. Weakness  R53.1     ED Discharge Orders    None       Discharge Instructions Discussed with and Provided to Patient:     Discharge Instructions     You were evaluated in the Emergency Department and after careful evaluation, we did not find any emergent condition requiring admission or further testing in the hospital.  Your exam/testing today was overall reassuring.  Your symptoms seem to be due to low sodium levels.  We encourage you to continue to cut down on your drinking.  Please return to the Emergency Department if you experience any worsening of your condition.  Thank you for allowing [place] to be a part of your care.        Sabas Sous, MD 08/07/20 2244

## 2020-08-07 NOTE — ED Notes (Signed)
Called cab for patient. Patient escorted to the lobby to catch his cab.

## 2020-08-07 NOTE — Discharge Instructions (Addendum)
You were evaluated in the Emergency Department and after careful evaluation, we did not find any emergent condition requiring admission or further testing in the hospital.  Your exam/testing today was overall reassuring.  Your symptoms seem to be due to low sodium levels.  We encourage you to continue to cut down on your drinking.  Please return to the Emergency Department if you experience any worsening of your condition.  Thank you for allowing [place] to be a part of your care.

## 2020-08-07 NOTE — ED Notes (Signed)
Patient unable to sign for d/c due to computer malfunction. Patient verbalized understanding of d/c instructions and return precautions.

## 2020-08-07 NOTE — ED Triage Notes (Signed)
Pt states he is coming from the [place]. Pt states that his sodium level is 120. Pt states that he has been trying to replace at home without success. Pt states he has been very weak.

## 2020-08-10 DIAGNOSIS — R6889 Other general symptoms and signs: Secondary | ICD-10-CM | POA: Diagnosis not present

## 2020-08-16 DIAGNOSIS — R6889 Other general symptoms and signs: Secondary | ICD-10-CM | POA: Diagnosis not present

## 2020-08-21 ENCOUNTER — Encounter (HOSPITAL_COMMUNITY): Payer: Self-pay | Admitting: Pediatrics

## 2020-08-21 ENCOUNTER — Emergency Department (HOSPITAL_COMMUNITY): Payer: Non-veteran care

## 2020-08-21 ENCOUNTER — Other Ambulatory Visit: Payer: Self-pay

## 2020-08-21 ENCOUNTER — Inpatient Hospital Stay (HOSPITAL_COMMUNITY)
Admission: EM | Admit: 2020-08-21 | Discharge: 2020-08-24 | DRG: 251 | Disposition: A | Discharge status: Home / Self Care | Payer: Non-veteran care | Attending: Internal Medicine | Admitting: Internal Medicine

## 2020-08-21 DIAGNOSIS — E871 Hypo-osmolality and hyponatremia: Secondary | ICD-10-CM | POA: Diagnosis present

## 2020-08-21 DIAGNOSIS — R778 Other specified abnormalities of plasma proteins: Secondary | ICD-10-CM | POA: Diagnosis present

## 2020-08-21 DIAGNOSIS — I161 Hypertensive emergency: Secondary | ICD-10-CM | POA: Diagnosis present

## 2020-08-21 DIAGNOSIS — F1290 Cannabis use, unspecified, uncomplicated: Secondary | ICD-10-CM | POA: Diagnosis present

## 2020-08-21 DIAGNOSIS — E119 Type 2 diabetes mellitus without complications: Secondary | ICD-10-CM

## 2020-08-21 DIAGNOSIS — N183 Chronic kidney disease, stage 3 unspecified: Secondary | ICD-10-CM | POA: Diagnosis present

## 2020-08-21 DIAGNOSIS — E118 Type 2 diabetes mellitus with unspecified complications: Secondary | ICD-10-CM

## 2020-08-21 DIAGNOSIS — E1165 Type 2 diabetes mellitus with hyperglycemia: Secondary | ICD-10-CM | POA: Diagnosis present

## 2020-08-21 DIAGNOSIS — J449 Chronic obstructive pulmonary disease, unspecified: Secondary | ICD-10-CM | POA: Diagnosis present

## 2020-08-21 DIAGNOSIS — Z8249 Family history of ischemic heart disease and other diseases of the circulatory system: Secondary | ICD-10-CM

## 2020-08-21 DIAGNOSIS — E1169 Type 2 diabetes mellitus with other specified complication: Secondary | ICD-10-CM | POA: Diagnosis present

## 2020-08-21 DIAGNOSIS — Z7982 Long term (current) use of aspirin: Secondary | ICD-10-CM

## 2020-08-21 DIAGNOSIS — R7989 Other specified abnormal findings of blood chemistry: Secondary | ICD-10-CM | POA: Diagnosis present

## 2020-08-21 DIAGNOSIS — I249 Acute ischemic heart disease, unspecified: Secondary | ICD-10-CM | POA: Diagnosis not present

## 2020-08-21 DIAGNOSIS — Z87891 Personal history of nicotine dependence: Secondary | ICD-10-CM

## 2020-08-21 DIAGNOSIS — I129 Hypertensive chronic kidney disease with stage 1 through stage 4 chronic kidney disease, or unspecified chronic kidney disease: Secondary | ICD-10-CM | POA: Diagnosis present

## 2020-08-21 DIAGNOSIS — E1122 Type 2 diabetes mellitus with diabetic chronic kidney disease: Secondary | ICD-10-CM | POA: Diagnosis present

## 2020-08-21 DIAGNOSIS — Z7951 Long term (current) use of inhaled steroids: Secondary | ICD-10-CM

## 2020-08-21 DIAGNOSIS — Z8719 Personal history of other diseases of the digestive system: Secondary | ICD-10-CM

## 2020-08-21 DIAGNOSIS — Z9119 Patient's noncompliance with other medical treatment and regimen: Secondary | ICD-10-CM

## 2020-08-21 DIAGNOSIS — R079 Chest pain, unspecified: Secondary | ICD-10-CM | POA: Diagnosis not present

## 2020-08-21 DIAGNOSIS — Z794 Long term (current) use of insulin: Secondary | ICD-10-CM

## 2020-08-21 DIAGNOSIS — F1410 Cocaine abuse, uncomplicated: Secondary | ICD-10-CM | POA: Diagnosis present

## 2020-08-21 DIAGNOSIS — Z955 Presence of coronary angioplasty implant and graft: Secondary | ICD-10-CM

## 2020-08-21 DIAGNOSIS — I252 Old myocardial infarction: Secondary | ICD-10-CM

## 2020-08-21 DIAGNOSIS — Z888 Allergy status to other drugs, medicaments and biological substances status: Secondary | ICD-10-CM

## 2020-08-21 DIAGNOSIS — N1831 Chronic kidney disease, stage 3a: Secondary | ICD-10-CM | POA: Diagnosis present

## 2020-08-21 DIAGNOSIS — F1010 Alcohol abuse, uncomplicated: Secondary | ICD-10-CM | POA: Diagnosis present

## 2020-08-21 DIAGNOSIS — N1832 Chronic kidney disease, stage 3b: Secondary | ICD-10-CM | POA: Diagnosis present

## 2020-08-21 DIAGNOSIS — I214 Non-ST elevation (NSTEMI) myocardial infarction: Principal | ICD-10-CM

## 2020-08-21 DIAGNOSIS — Z79899 Other long term (current) drug therapy: Secondary | ICD-10-CM

## 2020-08-21 DIAGNOSIS — F32A Depression, unspecified: Secondary | ICD-10-CM | POA: Diagnosis present

## 2020-08-21 DIAGNOSIS — Z20822 Contact with and (suspected) exposure to covid-19: Secondary | ICD-10-CM | POA: Diagnosis present

## 2020-08-21 DIAGNOSIS — I493 Ventricular premature depolarization: Secondary | ICD-10-CM | POA: Diagnosis present

## 2020-08-21 DIAGNOSIS — I25110 Atherosclerotic heart disease of native coronary artery with unstable angina pectoris: Secondary | ICD-10-CM | POA: Diagnosis present

## 2020-08-21 DIAGNOSIS — R0789 Other chest pain: Secondary | ICD-10-CM | POA: Diagnosis not present

## 2020-08-21 DIAGNOSIS — E785 Hyperlipidemia, unspecified: Secondary | ICD-10-CM | POA: Diagnosis present

## 2020-08-21 DIAGNOSIS — H35033 Hypertensive retinopathy, bilateral: Secondary | ICD-10-CM | POA: Diagnosis present

## 2020-08-21 LAB — BASIC METABOLIC PANEL
Anion gap: 11 (ref 5–15)
BUN: 15 mg/dL (ref 8–23)
CO2: 20 mmol/L — ABNORMAL LOW (ref 22–32)
Calcium: 9.9 mg/dL (ref 8.9–10.3)
Chloride: 99 mmol/L (ref 98–111)
Creatinine, Ser: 1.46 mg/dL — ABNORMAL HIGH (ref 0.61–1.24)
GFR calc Af Amer: 59 mL/min — ABNORMAL LOW (ref >60)
GFR calc non Af Amer: 51 mL/min — ABNORMAL LOW (ref >60)
Glucose, Bld: 242 mg/dL — ABNORMAL HIGH (ref 70–99)
Potassium: 3.8 mmol/L (ref 3.5–5.1)
Sodium: 130 mmol/L — ABNORMAL LOW (ref 135–145)

## 2020-08-21 LAB — CBG MONITORING, ED: Glucose-Capillary: 171 mg/dL — ABNORMAL HIGH (ref 70–99)

## 2020-08-21 LAB — RAPID URINE DRUG SCREEN, HOSP PERFORMED
Amphetamines: NOT DETECTED
Barbiturates: NOT DETECTED
Benzodiazepines: NOT DETECTED
Cocaine: NOT DETECTED
Opiates: NOT DETECTED
Tetrahydrocannabinol: POSITIVE — AB

## 2020-08-21 LAB — RESPIRATORY PANEL BY RT PCR (FLU A&B, COVID)
Influenza A by PCR: NEGATIVE
Influenza B by PCR: NEGATIVE
SARS Coronavirus 2 by RT PCR: NEGATIVE

## 2020-08-21 LAB — CBC
HCT: 38 % — ABNORMAL LOW (ref 39.0–52.0)
Hemoglobin: 13.6 g/dL (ref 13.0–17.0)
MCH: 28 pg (ref 26.0–34.0)
MCHC: 35.8 g/dL (ref 30.0–36.0)
MCV: 78.4 fL — ABNORMAL LOW (ref 80.0–100.0)
Platelets: 209 10*3/uL (ref 150–400)
RBC: 4.85 MIL/uL (ref 4.22–5.81)
RDW: 13.2 % (ref 11.5–15.5)
WBC: 7.3 10*3/uL (ref 4.0–10.5)
nRBC: 0 % (ref 0.0–0.2)

## 2020-08-21 LAB — TROPONIN I (HIGH SENSITIVITY)
Troponin I (High Sensitivity): 1499 ng/L (ref <18)
Troponin I (High Sensitivity): 1729 ng/L (ref <18)

## 2020-08-21 MED ORDER — ASPIRIN EC 81 MG PO TBEC: 81.0000 mg | DELAYED_RELEASE_TABLET | Freq: Every day | ORAL | Status: DC

## 2020-08-21 MED ORDER — NITROGLYCERIN IN D5W 200-5 MCG/ML-% IV SOLN
0.0000 ug/min | INTRAVENOUS | Status: DC
  Administered 2020-08-21: 5 ug/min via INTRAVENOUS
  Filled 2020-08-21: qty 250

## 2020-08-21 MED ORDER — FOLIC ACID 1 MG PO TABS
1.0000 mg | ORAL_TABLET | Freq: Every day | ORAL | Status: DC
  Administered 2020-08-23 – 2020-08-24 (×2): 1 mg via ORAL
  Filled 2020-08-21 (×2): qty 1

## 2020-08-21 MED ORDER — LORAZEPAM 2 MG/ML IJ SOLN: 1.0000 mg | INTRAMUSCULAR | Status: DC | PRN

## 2020-08-21 MED ORDER — FERROUS SULFATE 325 (65 FE) MG PO TABS
325.0000 mg | ORAL_TABLET | Freq: Every day | ORAL | Status: DC
  Administered 2020-08-23 – 2020-08-24 (×2): 325 mg via ORAL
  Filled 2020-08-21 (×2): qty 1

## 2020-08-21 MED ORDER — LORAZEPAM 2 MG/ML IJ SOLN: 0.0000 mg | INTRAMUSCULAR | Status: AC

## 2020-08-21 MED ORDER — MAGNESIUM SULFATE 2 GM/50ML IV SOLN
2.0000 g | Freq: Once | INTRAVENOUS | Status: AC
  Administered 2020-08-21: 2 g via INTRAVENOUS
  Filled 2020-08-21: qty 50

## 2020-08-21 MED ORDER — ADULT MULTIVITAMIN W/MINERALS CH
1.0000 | ORAL_TABLET | Freq: Every day | ORAL | Status: DC
  Administered 2020-08-23 – 2020-08-24 (×2): 1 via ORAL
  Filled 2020-08-21 (×2): qty 1

## 2020-08-21 MED ORDER — AMLODIPINE BESYLATE 10 MG PO TABS
10.0000 mg | ORAL_TABLET | Freq: Every day | ORAL | Status: DC
  Administered 2020-08-23 – 2020-08-24 (×2): 10 mg via ORAL
  Filled 2020-08-21 (×2): qty 1

## 2020-08-21 MED ORDER — LORAZEPAM 2 MG/ML IJ SOLN: 0.0000 mg | Freq: Three times a day (TID) | INTRAMUSCULAR | Status: DC

## 2020-08-21 MED ORDER — FUROSEMIDE 20 MG PO TABS
40.0000 mg | ORAL_TABLET | ORAL | Status: DC
  Administered 2020-08-21: 40 mg via ORAL
  Filled 2020-08-21: qty 2

## 2020-08-21 MED ORDER — HEPARIN (PORCINE) 25000 UT/250ML-% IV SOLN
850.0000 [IU]/h | INTRAVENOUS | Status: DC
  Administered 2020-08-21: 950 [IU]/h via INTRAVENOUS
  Filled 2020-08-21: qty 250

## 2020-08-21 MED ORDER — NITROPRUSSIDE SODIUM-NACL 20-0.9 MG/100ML-% IV SOLN
0.0000 ug/kg/min | INTRAVENOUS | Status: DC
  Filled 2020-08-21: qty 100

## 2020-08-21 MED ORDER — ASPIRIN 300 MG RE SUPP: 300.0000 mg | RECTAL | Status: DC

## 2020-08-21 MED ORDER — LORAZEPAM 2 MG/ML IJ SOLN
1.0000 mg | Freq: Once | INTRAMUSCULAR | Status: AC
  Administered 2020-08-21: 1 mg via INTRAVENOUS
  Filled 2020-08-21: qty 1

## 2020-08-21 MED ORDER — BRIMONIDINE TARTRATE 0.2 % OP SOLN
1.0000 [drp] | Freq: Two times a day (BID) | OPHTHALMIC | Status: DC
  Administered 2020-08-22 – 2020-08-24 (×4): 1 [drp] via OPHTHALMIC
  Filled 2020-08-21: qty 5

## 2020-08-21 MED ORDER — ALBUTEROL SULFATE HFA 108 (90 BASE) MCG/ACT IN AERS
2.0000 | INHALATION_SPRAY | Freq: Four times a day (QID) | RESPIRATORY_TRACT | Status: DC | PRN
  Filled 2020-08-21: qty 6.7

## 2020-08-21 MED ORDER — ASPIRIN 81 MG PO CHEW: 324.0000 mg | CHEWABLE_TABLET | ORAL | Status: DC

## 2020-08-21 MED ORDER — MOMETASONE FURO-FORMOTEROL FUM 200-5 MCG/ACT IN AERO
2.0000 | INHALATION_SPRAY | Freq: Two times a day (BID) | RESPIRATORY_TRACT | Status: DC
  Administered 2020-08-22 – 2020-08-24 (×4): 2 via RESPIRATORY_TRACT
  Filled 2020-08-21: qty 8.8

## 2020-08-21 MED ORDER — INSULIN ASPART 100 UNIT/ML ~~LOC~~ SOLN
0.0000 [IU] | Freq: Three times a day (TID) | SUBCUTANEOUS | Status: DC
  Administered 2020-08-22 – 2020-08-23 (×2): 3 [IU] via SUBCUTANEOUS
  Administered 2020-08-23: 8 [IU] via SUBCUTANEOUS
  Administered 2020-08-23: 3 [IU] via SUBCUTANEOUS
  Administered 2020-08-24: 5 [IU] via SUBCUTANEOUS

## 2020-08-21 MED ORDER — THIAMINE HCL 100 MG/ML IJ SOLN
100.0000 mg | Freq: Every day | INTRAMUSCULAR | Status: DC
  Filled 2020-08-21: qty 2

## 2020-08-21 MED ORDER — THIAMINE HCL 100 MG PO TABS
100.0000 mg | ORAL_TABLET | Freq: Every day | ORAL | Status: DC
  Administered 2020-08-23 – 2020-08-24 (×2): 100 mg via ORAL
  Filled 2020-08-21 (×2): qty 1

## 2020-08-21 MED ORDER — INSULIN GLARGINE 100 UNIT/ML ~~LOC~~ SOLN
30.0000 [IU] | Freq: Every day | SUBCUTANEOUS | Status: DC
  Filled 2020-08-21: qty 0.3

## 2020-08-21 MED ORDER — TRAZODONE HCL 50 MG PO TABS
75.0000 mg | ORAL_TABLET | Freq: Every day | ORAL | Status: DC
  Administered 2020-08-21 – 2020-08-23 (×3): 75 mg via ORAL
  Filled 2020-08-21 (×3): qty 2

## 2020-08-21 MED ORDER — LORAZEPAM 1 MG PO TABS: 1.0000 mg | ORAL_TABLET | ORAL | Status: DC | PRN

## 2020-08-21 MED ORDER — METOPROLOL TARTRATE 25 MG PO TABS
25.0000 mg | ORAL_TABLET | Freq: Two times a day (BID) | ORAL | Status: DC
  Administered 2020-08-21 – 2020-08-22 (×2): 25 mg via ORAL
  Filled 2020-08-21 (×2): qty 1

## 2020-08-21 MED ORDER — ACETAMINOPHEN 650 MG RE SUPP: 650.0000 mg | Freq: Four times a day (QID) | RECTAL | Status: DC | PRN

## 2020-08-21 MED ORDER — ACETAMINOPHEN 325 MG PO TABS: 650.0000 mg | ORAL_TABLET | Freq: Four times a day (QID) | ORAL | Status: DC | PRN

## 2020-08-21 MED ORDER — ONDANSETRON HCL 4 MG/2ML IJ SOLN: 4.0000 mg | Freq: Four times a day (QID) | INTRAMUSCULAR | Status: DC | PRN

## 2020-08-21 MED ORDER — HEPARIN BOLUS VIA INFUSION
4000.0000 [IU] | Freq: Once | INTRAVENOUS | Status: AC
  Administered 2020-08-21: 4000 [IU] via INTRAVENOUS
  Filled 2020-08-21: qty 4000

## 2020-08-21 MED ORDER — LATANOPROST 0.005 % OP SOLN
1.0000 [drp] | Freq: Every day | OPHTHALMIC | Status: DC
  Filled 2020-08-21: qty 2.5

## 2020-08-21 MED ORDER — ALPRAZOLAM 0.25 MG PO TABS: 0.2500 mg | ORAL_TABLET | Freq: Two times a day (BID) | ORAL | Status: DC | PRN

## 2020-08-21 MED ORDER — ATORVASTATIN CALCIUM 40 MG PO TABS
40.0000 mg | ORAL_TABLET | Freq: Every day | ORAL | Status: DC
  Administered 2020-08-23: 40 mg via ORAL
  Filled 2020-08-21: qty 1

## 2020-08-21 MED ORDER — HYDRALAZINE HCL 25 MG PO TABS
25.0000 mg | ORAL_TABLET | Freq: Three times a day (TID) | ORAL | Status: DC
  Administered 2020-08-21 – 2020-08-23 (×5): 25 mg via ORAL
  Filled 2020-08-21 (×5): qty 1

## 2020-08-21 NOTE — Progress Notes (Signed)
ANTICOAGULATION CONSULT NOTE - Initial Consult  Pharmacy Consult for IV heparin Indication: chest pain/ACS  Allergies  Allergen Reactions  . Lisinopril-Hydrochlorothiazide Swelling    Causes swelling of lips  . Simvastatin Other (See Comments)    Reported by [facility] 04/10/16 - unknown reaction    Patient Measurements: Height: 5\' 11"  (180.3 cm) Weight: 79.8 kg (176 lb) IBW/kg (Calculated) : 75.3 Heparin Dosing Weight: 79.8kg  Vital Signs: Temp: 98.5 F (36.9 C) (09/28 1431) Temp Source: Oral (09/28 1431) BP: 192/93 (09/28 1830) Pulse Rate: 79 (09/28 1830)  Labs: Recent Labs    08/21/20 1436  HGB 13.6  HCT 38.0*  PLT 209  CREATININE 1.46*  TROPONINIHS 1,499*    Estimated Creatinine Clearance: 56.6 mL/min (A) (by C-G formula based on SCr of 1.46 mg/dL (H)).   Medical History: Past Medical History:  Diagnosis Date  . Alcohol abuse   . CAD (coronary artery disease)    a. Reported MI in 2012 s/p 2 stents;  b. 08/2012 Cath: LM 20, LAD 20 diff ISR, jailed septal - 99%, LCX 30ost, RI large, min irregs, RCA 30p, 20-30 ISR-->Med Rx; c. 2017 Pt [facility].  2018 CKD (chronic kidney disease), stage III   . Cocaine abuse (HCC)   . COPD (chronic obstructive pulmonary disease) (HCC)   . Depression   . Diabetes mellitus without complication (HCC)   . Hyperlipidemia   . Hypertension   . Noncompliance     Medications:  Scheduled:  . heparin  4,000 Units Intravenous Once    Assessment: 59yoM presenting with chest pain x2 weeks, PMH of heart stents and hypertension. Troponins elevated ~1500. CBC WNL. Pharmacy to dose IV heparin for ACS.  Goal of Therapy:  Heparin level 0.3-0.7 units/ml Monitor platelets by anticoagulation protocol: Yes   Plan:  Give heparin 4,000 units bolus x 1 Start heparin infusion at 950 units/hr Check anti-Xa level in 6 hours and daily while on heparin Continue to monitor H&H and platelets  Follow cardiac  workup and plans  Marland Kitchen, PharmD PGY1 Acute Care Pharmacy Resident Please refer to [facility] for unit-specific pharmacist

## 2020-08-21 NOTE — Consult Note (Signed)
Cardiology Consult:   Patient ID: Jose [name] MRN: [id number]; DOB: 09/26/1961   Admission date: 08/21/2020  Primary Care Provider: [name] Cardiologist: Kristeen Miss, MD  [facility] Electrophysiologist:  None   Chief Complaint:  NSTEMI, chest pain  Patient Profile:   Jose [name] is a 59 y.o. 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   History of Present Illness:   Jose [name] had a stress test in 2018: "positive for transient ischemic dilatation which suggests diffuse sub endocardial reversible ischemia. Additionally, there may be a small focus of reversible ischemia at the apical anterior wall."  He was last seen by Dr. Elease Hashimoto 09/08/19.   He presented to [facility] 07/26/20 with chest pain that started 2 weeks ago and worsened over the last two days. He reports intermittent chest pain that radiates to his left arm and left neck. CP is worse with eating and with lying down. He initially presented to the [place] who sent him here for hyponatremia. He did use cocaine Sunday night, but did not use in the prior two weeks. He also reduced his alcohol use from 6 beers nightly to 3 beers nightly in the past week. This morning, he had progression of CP at 8:30 am, lasted about 5 minutes and subsided. He ate breakfast and had a recurrence of chest pain at 10AM for about 3-5 min before resolving. CP rated as a 10/10 and described as a pressure in his chest radiating to his back that also makes him short of breath. COPD inhalers have not helped his CP.   He is significantly hypertensive 215/105 and does report intermittent blurry vision today.    Past Medical History:  Diagnosis Date  . Alcohol abuse   . CAD (coronary artery disease)     a. Reported MI in 2012 s/p 2 stents;  b. 08/2012 Cath: LM 20, LAD 20 diff ISR, jailed septal - 99%, LCX 30ost, RI large, min irregs, RCA 30p, 20-30 ISR-->Med Rx; c. 2017 Pt reports Neg [facility] @ [facility].  Marland Kitchen CKD (chronic kidney disease), stage III   . Cocaine abuse (HCC)   . COPD (chronic obstructive pulmonary disease) (HCC)   . Depression   . Diabetes mellitus without complication (HCC)   . Hyperlipidemia   . Hypertension   . Noncompliance     Past Surgical History:  Procedure Laterality Date  . CARDIAC CATHETERIZATION    . CORONARY STENT PLACEMENT    . LEFT HEART CATHETERIZATION WITH CORONARY ANGIOGRAM Bilateral 08/25/2012   Procedure: LEFT HEART CATHETERIZATION WITH CORONARY ANGIOGRAM;  Surgeon: Kathleene Hazel, MD;  Location: [facility] CATH LAB;  Service: Cardiovascular;  Laterality: Bilateral;     Medications Prior to Admission: Prior to Admission medications   Medication Sig Start Date End Date Taking? Authorizing Provider  albuterol (PROVENTIL HFA;VENTOLIN HFA) 108 (90 BASE) MCG/ACT inhaler Inhale 2 puffs into the lungs every 6 (six) hours as needed for wheezing or shortness of breath.     [provider]  amLODipine (NORVASC) 5 MG tablet Take 1 tablet (5 mg total) by mouth daily. 05/01/19   Rhetta Mura, MD  atorvastatin (LIPITOR) 40 MG tablet Take 1 tablet (40 mg total) by mouth daily at 6 PM. 04/30/19   Rhetta Mura, MD  brimonidine [facility])  0.2 % ophthalmic solution Place 1 drop into both eyes 2 (two) times daily.  04/11/15   [provider]  budesonide-formoterol (SYMBICORT) 160-4.5 MCG/ACT inhaler Inhale 2 puffs into the lungs 2 (two) times daily.    [provider]  Cyanocobalamin (VITAMIN B-12 PO) Take 1 tablet by mouth daily.     [provider]  ferrous sulfate 325 (65 FE) MG tablet Take 325 mg by mouth daily with breakfast.    [provider]  folic acid (FOLVITE) 1 MG tablet Take 1 tablet (1 mg total) by mouth  daily. 04/30/19   Rhetta Mura, MD  furosemide (LASIX) 40 MG tablet Take 1 tablet (40 mg total) by mouth every other day. 04/30/19   Rhetta Mura, MD  glucose 4 GM chewable tablet Chew 4 g by mouth daily as needed for low blood sugar.     [provider]  hydrALAZINE (APRESOLINE) 25 MG tablet Take 1 tablet (25 mg total) by mouth 3 (three) times daily. 09/12/19 09/06/20  [name], Deloris Ping, MD  insulin aspart (NOVOLOG) 100 UNIT/ML injection Inject 0-9 Units into the skin 3 (three) times daily with meals. 07/25/15   Kathlen Mody, MD  insulin glargine (LANTUS) 100 UNIT/ML injection Inject 0.3 mLs (30 Units total) into the skin daily before breakfast. 08/30/17   Zannie Cove, MD  latanoprost (XALATAN) 0.005 % ophthalmic solution Place 1 drop into both eyes at bedtime.  04/11/15   [provider]  ondansetron (ZOFRAN ODT) 4 MG disintegrating tablet Take 1 tablet (4 mg total) by mouth every 8 (eight) hours as needed for nausea or vomiting. 06/16/20   Joy, Shawn C, PA-C  predniSONE (STERAPRED UNI-PAK 21 TAB) 10 MG (21) TBPK tablet Take 6 tabs (60mg ) day 1, 5 tabs (50mg ) day 2, 4 tabs (40mg ) day 3, 3 tabs (30mg ) day 4, 2 tabs (20mg ) day 5, and 1 tab (10mg ) day 6. 06/16/20   Joy, Shawn C, PA-C  traZODone (DESYREL) 150 MG tablet Take 75 mg by mouth at bedtime.     [provider]     Allergies:    Allergies  Allergen Reactions  . Lisinopril-Hydrochlorothiazide Swelling    Causes swelling of lips  . Simvastatin Other (See Comments)    Reported by [facility] 04/10/16 - unknown reaction    Social History:   Social History   Socioeconomic History  . Marital status: Married    Spouse name: Not on file  . Number of children: Not on file  . Years of education: Not on file  . Highest education level: Not on file  Occupational History  . Not on file  Tobacco Use  . Smoking status: Former Smoker    Types: Cigarettes    Quit date: 11/25/2007    Years  since quitting: 12.7  . Smokeless tobacco: Never Used  Vaping Use  . Vaping Use: Never used  Substance and Sexual Activity  . Alcohol use: Yes    Comment: Used to drink heavily - says currently 2 beers/day.  . Drug use: Yes    Types: Marijuana    Comment: reports not using cocaine  . Sexual activity: Yes    Birth control/protection: None  Other Topics Concern  . Not on file  Social History Narrative   Lives in [place] by himself.  Does not work or routinely exercise.   Social Determinants of Health   Financial Resource Strain:   . Difficulty of Paying Living Expenses: Not on file  Food  Insecurity:   . Worried About Programme researcher, broadcasting/film/video in the Last Year: Not on file  . Ran Out of Food in the Last Year: Not on file  Transportation Needs:   . Lack of Transportation (Medical): Not on file  . Lack of Transportation (Non-Medical): Not on file  Physical Activity:   . Days of Exercise per Week: Not on file  . Minutes of Exercise per Session: Not on file  Stress:   . Feeling of Stress : Not on file  Social Connections:   . Frequency of Communication with Friends and Family: Not on file  . Frequency of Social Gatherings with Friends and Family: Not on file  . Attends Religious Services: Not on file  . Active Member of Clubs or Organizations: Not on file  . Attends Banker Meetings: Not on file  . Marital Status: Not on file  Intimate Partner Violence:   . Fear of Current or Ex-Partner: Not on file  . Emotionally Abused: Not on file  . Physically Abused: Not on file  . Sexually Abused: Not on file    Family History:   The patient's family history includes Heart disease in his mother.    ROS:  Please see the history of present illness.  All other ROS reviewed and negative.     Physical Exam/Data:   Vitals:   08/21/20 1528 08/21/20 1630 08/21/20 1735 08/21/20 1830  BP: (!) 156/89 (!) 174/87 (!) 184/74 (!) 192/93  Pulse: 70 66 63 79  Resp: 18 18 16 20   Temp:       TempSrc:      SpO2: 98% 100% 100% 98%  Weight:      Height:       No intake or output data in the 24 hours ending 08/21/20 2103 Last 3 Weights 08/21/2020 09/08/2019 06/10/2019  Weight (lbs) 176 lb 185 lb 12.8 oz 178 lb  Weight (kg) 79.833 kg 84.278 kg 80.74 kg     Body mass index is 24.55 kg/m.  General:  Well nourished, well developed, in no acute distress, anxious HEENT: normal Neck: no JVD Cardiac:  normal S1, S2; RRR; no murmur  Lungs:  clear to auscultation bilaterally, no wheezing, rhonchi or rales  Abd: soft, nontender, no hepatomegaly  Ext: no edema Musculoskeletal:  No deformities, BUE and BLE strength normal and equal Skin: warm and dry  Neuro:  no focal abnormalities noted Psych:  Normal affect    EKG:  The ECG that was done was personally reviewed and demonstrates sinus rhythm HR 76, TWI inferior leads  Relevant CV Studies:  Echo 04/27/19: 1. The left ventricle has normal systolic function with an ejection  fraction of 60-65%. The cavity size was normal. There is moderately  increased left ventricular wall thickness. Left ventricular diastolic  Doppler parameters are consistent with impaired  relaxation. No evidence of left ventricular regional wall motion  abnormalities.  2. The right ventricle has normal systolic function. The cavity was  normal. There is no increase in right ventricular wall thickness. Right  ventricular systolic pressure could not be assessed.  3. The aortic valve is tricuspid. Mild sclerosis of the aortic valve.    Laboratory Data:  High Sensitivity Troponin:   Recent Labs  Lab 08/21/20 1436 08/21/20 1930  TROPONINIHS 1,499* 1,729*      Chemistry Recent Labs  Lab 08/21/20 1436  NA 130*  K 3.8  CL 99  CO2 20*  GLUCOSE 242*  BUN 15  CREATININE  1.46*  CALCIUM 9.9  GFRNONAA 51*  GFRAA 59*  ANIONGAP 11    No results for input(s): PROT, ALBUMIN, AST, ALT, ALKPHOS, BILITOT in the last 168 hours. Hematology Recent Labs   Lab 08/21/20 1436  WBC 7.3  RBC 4.85  HGB 13.6  HCT 38.0*  MCV 78.4*  MCH 28.0  MCHC 35.8  RDW 13.2  PLT 209   BNPNo results for input(s): BNP, PROBNP in the last 168 hours.  DDimer No results for input(s): DDIMER in the last 168 hours.   Radiology/Studies:  DG Chest 2 View  Result Date: 08/21/2020 CLINICAL DATA:  LEFT chest pain radiating to anterior neck for 2 weeks, history coronary disease post stenting, hypertension, diabetes mellitus, COPD, kidney disease, alcohol and substance abuse EXAM: CHEST - 2 VIEW COMPARISON:  12/18/2019 FINDINGS: Normal heart size, mediastinal contours, and pulmonary vascularity. Minimal bibasilar scarring. Lungs otherwise clear. No acute infiltrate, pleural effusion or pneumothorax. Osseous structures unremarkable. IMPRESSION: No acute abnormalities. Electronically Signed   By: Ulyses SouthwardMark  Boles M.D.   On: 08/21/2020 15:15       TIMI Risk Score for Unstable Angina or Non-ST Elevation MI:   The patient's TIMI risk score is 4, which indicates a 20% risk of all cause mortality, new or recurrent myocardial infarction or need for urgent revascularization in the next 14 days.      Assessment and Plan:   Chest pain NSTEMI CAD with remote history of RCA and LAD stents - hs troponin 1499 --> 1729 - EKG with inferior TWI - pt reports symtpoms concerning for unstable angina, symptoms did start prior to his cocaine use - suspect his cocaine use accelerated his NSTEMI - start hepar and nitro drips - start 25 mg lopressor - will obtain echo - plan for cardiac catheterization tomorrow   Hypertensive emergency - BP 215/101 on exam - start nitro drip as above - increase amlodipine to 10 mg - continue home hydralazine 25 mg TID --> will likely need titration - IV hydralazine PRN - neuro exam intact on exam, but does report intermittent blurry vision today   Cocaine use Alcohol use - used cocaine Sunday night - CP did start two weeks prior - also cut  his alcohol use in half last week - will defer CIWA to primary   Hyperlipidemia with LDL goal < 70 - increase atorvastatin 40 to 80 mg - recheck lipids in AM   CKD stage 3 - sCr 1.46, baseline seems to be 1.3-1.5 - will run hydration tonight per protocol   Insulin dependent DM - check A1c - per primary    For questions or updates, please contact [facility] Please consult [email or web address] for contact info under     Signed, Marcelino Dusterngela Nicole Duke, [place]  08/21/2020 9:03 PM    Patient seen and examined.  Agree with above documentation.  Jose [name] is a 59 year old male with a history of CAD status post prior stents to distal RCA and mid LAD, CKD stage III, IDDM, cocaine/alcohol abuse, COPD who we are consulted to see by Dr. Jeraldine LootsLockwood for evaluation of NSTEMI.  Reports he has been having chest pain for the past 2 weeks.  States that he particularly notices chest pain when he eats but also occurs with exertion.  Reports 2 episodes of resting chest pain this morning, each lasting about 5 minutes and prompting him to present to the ED.  Does report that he used cocaine on Sunday night, but states that was the only time he  has used cocaine in the past 2 weeks.  He has also cut back his beer drinking from 6 beers per night to 3 beers in the past week.  States that he continues to have intermittent chest pain that he describes as someone sitting on his chest.  Currently having chest pain.  In the ED, initial vital signs showed BP 150/95, pulse 72, SPO2 100% on room air.  Labs notable for creatinine 1.46, WBC 7.3, hemoglobin 13.6, high-sensitivity troponin 1499 > 1729.  EKG personally reviewed and shows normal sinus rhythm, rate 76, nonspecific T wave flattening.  Telemetry personally reviewed and shows normal sinus rhythm with rate 60s to 80s, PVCs.  Patient seen and examined.  Agree with above documentation.  On exam, patient is alert and oriented, regular rate and rhythm, no murmurs, lungs CTAB, no LE  edema or JVD.  Presentation is consistent with NSTEMI.  Will start on heparin drip and plan for cardiac catheterization tomorrow.  He is continuing to have chest pain.  Will start nitroglycerin drip, which should also help with his uncontrolled hypertension.  If unremitting chest pain, will need more urgent catheterization.  However if chest pain-free on nitroglycerin, and no hemodynamic instability or unstable ventricular arrhythmias, will plan for catheterization tomorrow.  Risks and benefits of cardiac catheterization have been discussed with the patient.  These include bleeding, infection, kidney damage, stroke, heart attack, death.  The patient understands these risks and is willing to proceed.  Little Ishikawa, MD

## 2020-08-21 NOTE — Consult Note (Signed)
Cardiology Consult:   Patient ID: Jose [name] MRN: [id number]; DOB: 09/26/1961   Admission date: 08/21/2020  Primary Care Provider: [name] Cardiologist: Kristeen Miss, MD  [facility] Electrophysiologist:  None   Chief Complaint:  NSTEMI, chest pain  Patient Profile:   Jose [name] is a 59 y.o. 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   History of Present Illness:   Mr. Gelpi had a stress test in 2018: "positive for transient ischemic dilatation which suggests diffuse sub endocardial reversible ischemia. Additionally, there may be a small focus of reversible ischemia at the apical anterior wall."  He was last seen by Dr. Elease Hashimoto 09/08/19.   He presented to [facility] 07/26/20 with chest pain that started 2 weeks ago and worsened over the last two days. He reports intermittent chest pain that radiates to his left arm and left neck. CP is worse with eating and with lying down. He initially presented to the [place] who sent him here for hyponatremia. He did use cocaine Sunday night, but did not use in the prior two weeks. He also reduced his alcohol use from 6 beers nightly to 3 beers nightly in the past week. This morning, he had progression of CP at 8:30 am, lasted about 5 minutes and subsided. He ate breakfast and had a recurrence of chest pain at 10AM for about 3-5 min before resolving. CP rated as a 10/10 and described as a pressure in his chest radiating to his back that also makes him short of breath. COPD inhalers have not helped his CP.   He is significantly hypertensive 215/105 and does report intermittent blurry vision today.    Past Medical History:  Diagnosis Date  . Alcohol abuse   . CAD (coronary artery disease)     a. Reported MI in 2012 s/p 2 stents;  b. 08/2012 Cath: LM 20, LAD 20 diff ISR, jailed septal - 99%, LCX 30ost, RI large, min irregs, RCA 30p, 20-30 ISR-->Med Rx; c. 2017 Pt reports Neg stress test @ [facility].  Marland Kitchen CKD (chronic kidney disease), stage III   . Cocaine abuse (HCC)   . COPD (chronic obstructive pulmonary disease) (HCC)   . Depression   . Diabetes mellitus without complication (HCC)   . Hyperlipidemia   . Hypertension   . Noncompliance     Past Surgical History:  Procedure Laterality Date  . CARDIAC CATHETERIZATION    . CORONARY STENT PLACEMENT    . LEFT HEART CATHETERIZATION WITH CORONARY ANGIOGRAM Bilateral 08/25/2012   Procedure: LEFT HEART CATHETERIZATION WITH CORONARY ANGIOGRAM;  Surgeon: Kathleene Hazel, MD;  Location: [facility] CATH LAB;  Service: Cardiovascular;  Laterality: Bilateral;     Medications Prior to Admission: Prior to Admission medications   Medication Sig Start Date End Date Taking? Authorizing Provider  albuterol (PROVENTIL HFA;VENTOLIN HFA) 108 (90 BASE) MCG/ACT inhaler Inhale 2 puffs into the lungs every 6 (six) hours as needed for wheezing or shortness of breath.     [provider]  amLODipine (NORVASC) 5 MG tablet Take 1 tablet (5 mg total) by mouth daily. 05/01/19   Rhetta Mura, MD  atorvastatin (LIPITOR) 40 MG tablet Take 1 tablet (40 mg total) by mouth daily at 6 PM. 04/30/19   Rhetta Mura, MD  brimonidine [facility])  0.2 % ophthalmic solution Place 1 drop into both eyes 2 (two) times daily.  04/11/15   [provider]  budesonide-formoterol (SYMBICORT) 160-4.5 MCG/ACT inhaler Inhale 2 puffs into the lungs 2 (two) times daily.    [provider]  Cyanocobalamin (VITAMIN B-12 PO) Take 1 tablet by mouth daily.     [provider]  ferrous sulfate 325 (65 FE) MG tablet Take 325 mg by mouth daily with breakfast.    [provider]  folic acid (FOLVITE) 1 MG tablet Take 1 tablet (1 mg total) by mouth  daily. 04/30/19   Rhetta Mura, MD  furosemide (LASIX) 40 MG tablet Take 1 tablet (40 mg total) by mouth every other day. 04/30/19   Rhetta Mura, MD  glucose 4 GM chewable tablet Chew 4 g by mouth daily as needed for low blood sugar.     [provider]  hydrALAZINE (APRESOLINE) 25 MG tablet Take 1 tablet (25 mg total) by mouth 3 (three) times daily. 09/12/19 09/06/20  [name], Deloris Ping, MD  insulin aspart (NOVOLOG) 100 UNIT/ML injection Inject 0-9 Units into the skin 3 (three) times daily with meals. 07/25/15   Kathlen Mody, MD  insulin glargine (LANTUS) 100 UNIT/ML injection Inject 0.3 mLs (30 Units total) into the skin daily before breakfast. 08/30/17   Zannie Cove, MD  latanoprost (XALATAN) 0.005 % ophthalmic solution Place 1 drop into both eyes at bedtime.  04/11/15   [provider]  ondansetron (ZOFRAN ODT) 4 MG disintegrating tablet Take 1 tablet (4 mg total) by mouth every 8 (eight) hours as needed for nausea or vomiting. 06/16/20   Joy, Shawn C, PA-C  predniSONE (STERAPRED UNI-PAK 21 TAB) 10 MG (21) TBPK tablet Take 6 tabs (60mg ) day 1, 5 tabs (50mg ) day 2, 4 tabs (40mg ) day 3, 3 tabs (30mg ) day 4, 2 tabs (20mg ) day 5, and 1 tab (10mg ) day 6. 06/16/20   Joy, Shawn C, PA-C  traZODone (DESYREL) 150 MG tablet Take 75 mg by mouth at bedtime.     [provider]     Allergies:    Allergies  Allergen Reactions  . Lisinopril-Hydrochlorothiazide Swelling    Causes swelling of lips  . Simvastatin Other (See Comments)    Reported by [facility] 04/10/16 - unknown reaction    Social History:   Social History   Socioeconomic History  . Marital status: Married    Spouse name: Not on file  . Number of children: Not on file  . Years of education: Not on file  . Highest education level: Not on file  Occupational History  . Not on file  Tobacco Use  . Smoking status: Former Smoker    Types: Cigarettes    Quit date: 11/25/2007    Years  since quitting: 12.7  . Smokeless tobacco: Never Used  Vaping Use  . Vaping Use: Never used  Substance and Sexual Activity  . Alcohol use: Yes    Comment: Used to drink heavily - says currently 2 beers/day.  . Drug use: Yes    Types: Marijuana    Comment: reports not using cocaine  . Sexual activity: Yes    Birth control/protection: None  Other Topics Concern  . Not on file  Social History Narrative   Lives in [place] by himself.  Does not work or routinely exercise.   Social Determinants of Health   Financial Resource Strain:   . Difficulty of Paying Living Expenses: Not on file  Food  Insecurity:   . Worried About Programme researcher, broadcasting/film/video in the Last Year: Not on file  . Ran Out of Food in the Last Year: Not on file  Transportation Needs:   . Lack of Transportation (Medical): Not on file  . Lack of Transportation (Non-Medical): Not on file  Physical Activity:   . Days of Exercise per Week: Not on file  . Minutes of Exercise per Session: Not on file  Stress:   . Feeling of Stress : Not on file  Social Connections:   . Frequency of Communication with Friends and Family: Not on file  . Frequency of Social Gatherings with Friends and Family: Not on file  . Attends Religious Services: Not on file  . Active Member of Clubs or Organizations: Not on file  . Attends Banker Meetings: Not on file  . Marital Status: Not on file  Intimate Partner Violence:   . Fear of Current or Ex-Partner: Not on file  . Emotionally Abused: Not on file  . Physically Abused: Not on file  . Sexually Abused: Not on file    Family History:   The patient's family history includes Heart disease in his mother.    ROS:  Please see the history of present illness.  All other ROS reviewed and negative.     Physical Exam/Data:   Vitals:   08/21/20 1528 08/21/20 1630 08/21/20 1735 08/21/20 1830  BP: (!) 156/89 (!) 174/87 (!) 184/74 (!) 192/93  Pulse: 70 66 63 79  Resp: 18 18 16 20   Temp:       TempSrc:      SpO2: 98% 100% 100% 98%  Weight:      Height:       No intake or output data in the 24 hours ending 08/21/20 2103 Last 3 Weights 08/21/2020 09/08/2019 06/10/2019  Weight (lbs) 176 lb 185 lb 12.8 oz 178 lb  Weight (kg) 79.833 kg 84.278 kg 80.74 kg     Body mass index is 24.55 kg/m.  General:  Well nourished, well developed, in no acute distress, anxious HEENT: normal Neck: no JVD Endocrine:  No thryomegaly Vascular: No carotid bruits; FA pulses 2+ bilaterally without bruits  Cardiac:  normal S1, S2; RRR; no murmur  Lungs:  clear to auscultation bilaterally, no wheezing, rhonchi or rales  Abd: soft, nontender, no hepatomegaly  Ext: no edema Musculoskeletal:  No deformities, BUE and BLE strength normal and equal Skin: warm and dry  Neuro:  CNs 2-12 intact, no focal abnormalities noted Psych:  Normal affect    EKG:  The ECG that was done was personally reviewed and demonstrates sinus rhythm HR 76, TWI inferior leads  Relevant CV Studies:  Echo 04/27/19: 1. The left ventricle has normal systolic function with an ejection  fraction of 60-65%. The cavity size was normal. There is moderately  increased left ventricular wall thickness. Left ventricular diastolic  Doppler parameters are consistent with impaired  relaxation. No evidence of left ventricular regional wall motion  abnormalities.  2. The right ventricle has normal systolic function. The cavity was  normal. There is no increase in right ventricular wall thickness. Right  ventricular systolic pressure could not be assessed.  3. The aortic valve is tricuspid. Mild sclerosis of the aortic valve.    Laboratory Data:  High Sensitivity Troponin:   Recent Labs  Lab 08/21/20 1436 08/21/20 1930  TROPONINIHS 1,499* 1,729*      Chemistry Recent Labs  Lab 08/21/20 1436  NA  130*  K 3.8  CL 99  CO2 20*  GLUCOSE 242*  BUN 15  CREATININE 1.46*  CALCIUM 9.9  GFRNONAA 51*  GFRAA 59*  ANIONGAP 11     No results for input(s): PROT, ALBUMIN, AST, ALT, ALKPHOS, BILITOT in the last 168 hours. Hematology Recent Labs  Lab 08/21/20 1436  WBC 7.3  RBC 4.85  HGB 13.6  HCT 38.0*  MCV 78.4*  MCH 28.0  MCHC 35.8  RDW 13.2  PLT 209   BNPNo results for input(s): BNP, PROBNP in the last 168 hours.  DDimer No results for input(s): DDIMER in the last 168 hours.   Radiology/Studies:  DG Chest 2 View  Result Date: 08/21/2020 CLINICAL DATA:  LEFT chest pain radiating to anterior neck for 2 weeks, history coronary disease post stenting, hypertension, diabetes mellitus, COPD, kidney disease, alcohol and substance abuse EXAM: CHEST - 2 VIEW COMPARISON:  12/18/2019 FINDINGS: Normal heart size, mediastinal contours, and pulmonary vascularity. Minimal bibasilar scarring. Lungs otherwise clear. No acute infiltrate, pleural effusion or pneumothorax. Osseous structures unremarkable. IMPRESSION: No acute abnormalities. Electronically Signed   By: Ulyses Southward M.D.   On: 08/21/2020 15:15       TIMI Risk Score for Unstable Angina or Non-ST Elevation MI:   The patient's TIMI risk score is 4, which indicates a 20% risk of all cause mortality, new or recurrent myocardial infarction or need for urgent revascularization in the next 14 days.      Assessment and Plan:   Chest pain NSTEMI CAD with remote history of RCA and LAD stents - hs troponin 1499 --> 1729 - EKG with inferior TWI - pt reports symtpoms concerning for unstable angina, symptoms did start prior to his cocaine use - suspect his cocaine use accelerated his NSTEMI - start hepar and nitro drips - start 25 mg lopressor - will obtain echo - plan for cardiac catheterization tomorrow   Hypertensive emergency - BP 215/101 on exam - start nitro drip as above - increase amlodipine to 10 mg - continue home hydralazine 25 mg TID --> will likely need titration - IV hydralazine PRN - neuro exam intact on exam, but does report intermittent blurry  vision today   Cocaine use Alcohol use - used cocaine Sunday night - CP did start two weeks prior - also cut his alcohol use in half last week - will defer CIWA to primary   Hyperlipidemia with LDL goal < 70 - increase atorvastatin 40 to 80 mg - recheck lipids in AM   CKD stage 3 - sCr 1.46, baseline seems to be 1.3-1.5 - will run hydration tonight per protocol   Insulin dependent DM - check A1c - per primary    For questions or updates, please contact [facility] Please consult [email or web address] for contact info under     Signed, Marcelino Duster, [place]  08/21/2020 9:03 PM

## 2020-08-21 NOTE — H&P (Signed)
History and Physical    Jose [name] GDJ:[id number] DOB: 1961/04/03 DOA: 08/21/2020  PCP: [name]   Patient coming from: Home.  I have personally briefly reviewed patient's old medical records in [facility]  Chief Complaint: Chest pain.  HPI: Jose [name] is a 59 y.o. male with medical history significant of alcohol abuse, CAD, stage III CKD, cocaine abuse, COPD, depression, type 2 diabetes, hyperlipidemia, hypertension, medication and treatment noncompliance who is coming to the emergency department with complaints of chest pain since about 2 weeks ago, but has gotten particularly worse in the past 2 days.  He describes the pain as a heaviness on his chest associated with dyspnea and nausea.  The pain radiates to his left neck, shoulder and arm.  It gets worse when he eats and when he lies down.  He denies palpitations, diaphoresis, PND or pitting edema of the lower extremities.  Has had epigastric pain that has not responded to Tums at home.  He denies emesis, diarrhea, constipation, melena or hematochezia.  No fever, chills, rhinorrhea, sore throat, wheezing or hemoptysis.  He denies dysuria, frequency hematuria.  No polyuria, polydipsia, polyphagia or blurred vision.  He states that his blood glucose has been under well controlled recently.  He admits to using cocaine on Sunday night and drinks beer daily.  Although he mentions he has not used cocaine in the previous 2 weeks and decrease his beer drinking from a sixpack every evening to only 2 beers a night.  ED Course: Initial vital signs in the emergency department were temperature 98.5 F, pulse 72, respiration 18, BP 150/95 mmHg and O2 sat 100% on room air.  The patient was started on heparin and nitroglycerin infusion.  He was seen by cardiology.  His UDS was positive for THC.  CBC shows a white count 7.3, hemoglobin 13.6 g/dL and platelets 622.  LFTs were normal.  Sodium is 130 and CO2 20 mmol/L.  The rest  of the electrolytes are within normal range.  Glucose 242, BUN 15 and creatinine 1.46 mg/dL.  EKG was NSR with T wave inversion on inferior leads.  Troponin was 1499 on the first sample and about 3 hours later increased to 1729 ng/L.  His chest radiograph did not show any acute abnormality.  Review of Systems: As per HPI otherwise all other systems reviewed and are negative.  Past Medical History:  Diagnosis Date  . Alcohol abuse   . CAD (coronary artery disease)    a. Reported MI in 2012 s/p 2 stents;  b. 08/2012 Cath: LM 20, LAD 20 diff ISR, jailed septal - 99%, LCX 30ost, RI large, min irregs, RCA 30p, 20-30 ISR-->Med Rx; c. 2017 [facility].  Marland Kitchen CKD (chronic kidney disease), stage III   . Cocaine abuse (HCC)   . COPD (chronic obstructive pulmonary disease) (HCC)   . Depression   . Diabetes mellitus without complication (HCC)   . Hyperlipidemia   . Hypertension   . Noncompliance    Past Surgical History:  Procedure Laterality Date  . CARDIAC CATHETERIZATION    . CORONARY STENT PLACEMENT    . LEFT HEART CATHETERIZATION WITH CORONARY ANGIOGRAM Bilateral 08/25/2012   Procedure: LEFT HEART CATHETERIZATION WITH CORONARY ANGIOGRAM;  Surgeon: Kathleene Hazel, MD;  Location: [facility] CATH [facility];  Service: Cardiovascular;  Laterality: Bilateral;   Social History  reports that he quit smoking about 12 years ago. His smoking use included cigarettes. He has never used  smokeless tobacco. He reports current alcohol use. He reports current drug use. Drug: Marijuana.  Allergies  Allergen Reactions  . Lisinopril-Hydrochlorothiazide Swelling    Causes swelling of lips  . Simvastatin Other (See Comments)    Reported by [facility] 04/10/16 - unknown reaction   Family History  Problem Relation Age of Onset  . Heart disease Mother        MI 66s   Prior to Admission medications   Medication Sig Start Date End Date Taking? Authorizing Provider  albuterol  (PROVENTIL HFA;VENTOLIN HFA) 108 (90 BASE) MCG/ACT inhaler Inhale 2 puffs into the lungs every 6 (six) hours as needed for wheezing or shortness of breath.    Yes [provider]  amLODipine (NORVASC) 5 MG tablet Take 1 tablet (5 mg total) by mouth daily. 05/01/19  Yes Rhetta Mura, MD  aspirin EC 81 MG tablet Take 81 mg by mouth daily. Swallow whole.   Yes [provider]  atorvastatin (LIPITOR) 40 MG tablet Take 1 tablet (40 mg total) by mouth daily at 6 PM. 04/30/19  Yes Rhetta Mura, MD  brimonidine (ALPHAGAN) 0.2 % ophthalmic solution Place 1 drop into both eyes 2 (two) times daily.  04/11/15  Yes [provider]  budesonide-formoterol (SYMBICORT) 160-4.5 MCG/ACT inhaler Inhale 2 puffs into the lungs 2 (two) times daily.   Yes [provider]  Cyanocobalamin (VITAMIN B-12 PO) Take 1 tablet by mouth daily.    Yes [provider]  ferrous sulfate 325 (65 FE) MG tablet Take 325 mg by mouth daily with breakfast.   Yes [provider]  folic acid (FOLVITE) 1 MG tablet Take 1 tablet (1 mg total) by mouth daily. 04/30/19  Yes Rhetta Mura, MD  furosemide (LASIX) 40 MG tablet Take 1 tablet (40 mg total) by mouth every other day. 04/30/19  Yes Rhetta Mura, MD  glucose 4 GM chewable tablet Chew 4 g by mouth daily as needed for [name] blood sugar.    Yes [provider]  hydrALAZINE (APRESOLINE) 25 MG tablet Take 1 tablet (25 mg total) by mouth 3 (three) times daily. 09/12/19 09/06/20 Yes [name], Deloris Ping, MD  insulin aspart (NOVOLOG) 100 UNIT/ML injection Inject 0-9 Units into the skin 3 (three) times daily with meals. 07/25/15  Yes Kathlen Mody, MD  insulin glargine (LANTUS) 100 UNIT/ML injection Inject 0.3 mLs (30 Units total) into the skin daily before breakfast. 08/30/17  Yes Zannie Cove, MD  latanoprost (XALATAN) 0.005 % ophthalmic solution Place 1 drop into both eyes at bedtime.  04/11/15  Yes [provider]  Multiple Vitamin (MULTIVITAMIN WITH MINERALS) TABS tablet Take 1 tablet by mouth daily.   Yes [provider]  traZODone (DESYREL) 150 MG tablet Take 75 mg by mouth at bedtime.    Yes [provider]  ondansetron (ZOFRAN ODT) 4 MG disintegrating tablet Take 1 tablet (4 mg total) by mouth every 8 (eight) hours as needed for nausea or vomiting. Patient not taking: Reported on 08/21/2020 06/16/20   Joy, Hillard Danker, PA-C  predniSONE (STERAPRED UNI-PAK 21 TAB) 10 MG (21) TBPK tablet Take 6 tabs (60mg ) day 1, 5 tabs (50mg ) day 2, 4 tabs (40mg ) day 3, 3 tabs (30mg ) day 4, 2 tabs (20mg ) day 5, and 1 tab (10mg ) day 6. Patient not taking: Reported on 08/21/2020 06/16/20     Physical Exam: Vitals:   08/21/20 1528 08/21/20 1630 08/21/20 1735 08/21/20 1830  BP: (!) 156/89 06/18/20)  174/87 (!) 184/74 (!) 192/93  Pulse: 70 66 63 79  Resp: 18 18 16 20   Temp:      TempSrc:      SpO2: 98% 100% 100% 98%  Weight:      Height:       Constitutional: NAD, calm, comfortable Eyes: PERRL, lids and conjunctivae normal ENMT: Mucous membranes are moist. Posterior pharynx clear of any exudate or lesions.  Neck: normal, supple, no masses, no thyromegaly Respiratory: clear to auscultation bilaterally, no wheezing, no crackles. Normal respiratory effort. No accessory muscle use.  Cardiovascular: Regular rate and rhythm, no murmurs / rubs / gallops. No extremity edema. 2+ pedal pulses. No carotid bruits.  Abdomen: no tenderness, no masses palpated. No hepatosplenomegaly. Bowel sounds positive.  Musculoskeletal: no clubbing / cyanosis. Good ROM, no contractures. Normal muscle tone.  Skin: no rashes, lesions, ulcers on limited dermatological examination. Neurologic: CN 2-12 grossly intact. Sensation intact, DTR normal. Strength 5/5 in all 4.  Psychiatric: Normal judgment and insight. Alert and oriented x 3. Normal mood.   Labs on Admission: I have personally reviewed following labs and  imaging studies  CBC: Recent Labs  Lab 08/21/20 1436  WBC 7.3  HGB 13.6  HCT 38.0*  MCV 78.4*  PLT 209    Basic Metabolic Panel: Recent Labs  Lab 08/21/20 1436  NA 130*  K 3.8  CL 99  CO2 20*  GLUCOSE 242*  BUN 15  CREATININE 1.46*  CALCIUM 9.9    GFR: Estimated Creatinine Clearance: 56.6 mL/min (A) (by C-G formula based on SCr of 1.46 mg/dL (H)).  Liver Function Tests: No results for input(s): AST, ALT, ALKPHOS, BILITOT, PROT, ALBUMIN in the last 168 hours.  Radiological Exams on Admission: DG Chest 2 View  Result Date: 08/21/2020 CLINICAL DATA:  LEFT chest pain radiating to anterior neck for 2 weeks, history coronary disease post stenting, hypertension, diabetes mellitus, COPD, kidney disease, alcohol and substance abuse EXAM: CHEST - 2 VIEW COMPARISON:  12/18/2019 FINDINGS: Normal heart size, mediastinal contours, and pulmonary vascularity. Minimal bibasilar scarring. Lungs otherwise clear. No acute infiltrate, pleural effusion or pneumothorax. Osseous structures unremarkable. IMPRESSION: No acute abnormalities. Electronically Signed   By: Ulyses SouthwardMark  Boles M.D.   On: 08/21/2020 15:15    EKG: Independently reviewed. Vent. rate 76 BPM PR interval 182 ms QRS duration 88 ms QT/QTc 370/416 ms P-R-T axes -21 33 -15 Normal sinus rhythm Normal ECG  Vent. rate 76 BPM PR interval 182 ms QRS duration 88 ms QT/QTc 370/416 ms P-R-T axes -21 33 -15 Normal sinus rhythm T wave abnormality Early repolarization pattern  Assessment/Plan Principal Problem:   Acute coronary syndrome (HCC)   Elevated troponin Observation/progressive unit. Continue supplemental oxygen. Received aspirin. Continue heparin infusion. Continue nitroglycerin infusion. Check echocardiogram. For cardiac cath tomorrow. Cardiology consult appreciated.  Active Problems:   Hypertensive emergency : Nitroglycerin infusion. Continue furosemide 40 mg p.o. daily. Continue hydralazine 25 mg p.o. twice  daily. Increase amlodipine to 10 mg p.o. daily. Started on metoprolol 25 mg p.o. twice daily by cardiology. Monitor BP, HR, renal function electrolytes.    Hyponatremia [facility] due to [facility]. Furosemide use plus beer drinking. Mild degree of pseudohyponatremia as well. Fluid restriction. Monitor sodium level.    Cocaine abuse (HCC) Cessation advised. Consult TOC.    Alcohol abuse CIWA protocol started. Magnesium sulfate 2 g IVPB. Thiamine, MVI and folate.    Uncontrolled type 2 diabetes mellitus with diabetic nephropathy, with long-term current use of  insulin (HCC) Carbohydrate modified diet    CKD (chronic kidney disease) stage 3, GFR 30-59 ml/min Stable. Monitor renal function electrolytes.    COPD (chronic obstructive pulmonary disease) (HCC) Continue Symbicort or formulary equivalent. Supplemental oxygen and rescue bronchodilators as needed.    Dyslipidemia Continue atorvastatin 40 mg p.o. daily.    Hypertensive retinopathy of both eyes Needs better blood pressure control. Continue Alphagan and Xalatan drops.    Depression Continue trazodone 150 mg p.o. bedtime.    DVT prophylaxis: On heparin infusion. Code Status:   Full code. Family Communication: Disposition Plan:   Patient is from:  Home.  Anticipated DC to:  Home.  Anticipated DC date:  08/22/2020 or 08/23/2020  Anticipated DC barriers: Clinical status. Consults called:  Cardiology (see consult note) Admission status:  Observation/progressive unit.   Severity of Illness: Very high due to hypertensive emergency with endorgan damage in the presence of acute coronary syndrome.  Bobette Mo MD [facility]  How to contact the [facility] Attending or Consulting provider 7A - 7P or covering provider during after hours 7P -7A, for this patient?   1. Check the care team in [facility] and look for a) attending/consulting [facility] provider listed and b) the [facility] team listed 2. Log into  [email or web address] and use [place]'s universal password to access. If you do not have the password, please contact the hospital operator. 3. Locate the [facility] provider you are looking for under [facility] and page to a number that you can be directly reached. 4. If you still have difficulty reaching the provider, please page the [facility] (Director on Call) for the Hospitalists listed on amion for assistance.  08/21/2020, 9:25 PM   This document was prepared using Dragon voice recognition software and may contain some unintended transcription errors.

## 2020-08-21 NOTE — ED Provider Notes (Signed)
[facility] EMERGENCY DEPARTMENT Provider Note   CSN: [id number] Arrival date & time: 08/21/20  1428     History Chief Complaint  Patient presents with  . Chest Pain    Jose [name] is a 59 y.o. male past medical history of cocaine abuse, CKD, hypertension, hyperlipidemia, type 2 diabetes, alcohol abuse, presenting to the emergency department with 2 weeks of gradually worsening substernal chest pain.  Patient states his symptoms initially began 2 weeks ago that were intermittent.  Over the last few days symptoms significantly worsened.  He is having substernal chest pain described as somebody wrenching on his chest.  Today symptoms radiated to his left arm and neck.  Symptoms have also been radiating to his back over the last few days.  He endorses diaphoresis and associated shortness of breath.  He felt like his symptoms were postprandial so he has been treating with Pepto-Bismol and Tums without relief.  His symptoms are occurring both at rest and with exertion.  He is not currently having active chest pain on evaluation.  He denies cocaine use.  He states he had to light beers over the last few days.  Cardiac history includes 2 stents.  He was evaluated by Dr. Elease Hashimoto in 2020 with cardiology.  The history is provided by the patient.       Past Medical History:  Diagnosis Date  . Alcohol abuse   . CAD (coronary artery disease)    a. Reported MI in 2012 s/p 2 stents;  b. 08/2012 Cath: LM 20, LAD 20 diff ISR, jailed septal - 99%, LCX 30ost, RI large, min irregs, RCA 30p, 20-30 ISR-->Med Rx; c. 2017 [facility].  Marland Kitchen CKD (chronic kidney disease), stage III   . Cocaine abuse (HCC)   . COPD (chronic obstructive pulmonary disease) (HCC)   . Depression   . Diabetes mellitus without complication (HCC)   . Hyperlipidemia   . Hypertension   . Noncompliance     Patient Active Problem List   Diagnosis Date Noted  . Elevated troponin   . Nausea  and vomiting   . Suspected COVID-19 virus infection   . Diarrhea 04/27/2019  . Hypertensive retinopathy of both eyes 02/01/2019  . Chest pain 08/30/2017  . Chest pain with high risk for cardiac etiology 08/28/2017  . CKD (chronic kidney disease) stage 3, GFR 30-59 ml/min 08/28/2017  . COPD (chronic obstructive pulmonary disease) (HCC) 08/28/2017  . CAD (coronary artery disease) 04/10/2016  . Dyslipidemia 04/10/2016  . Benign prostatic hyperplasia 04/10/2016  . Diabetes (HCC) 04/10/2016  . Hyperglycemia 10/17/2015  . Uncontrolled type 2 diabetes mellitus with diabetic nephropathy, with long-term current use of insulin (HCC) 10/17/2015  . Acute renal failure superimposed on stage 3 chronic kidney disease (HCC) 10/17/2015  . Hyperkalemia 10/17/2015  . Dyslipidemia associated with type 2 diabetes mellitus (HCC) 10/17/2015  . Alcohol abuse   . Cocaine abuse (HCC) 06/24/2015  . Hyponatremia 05/12/2015  . Hypertension     Past Surgical History:  Procedure Laterality Date  . CARDIAC CATHETERIZATION    . CORONARY STENT PLACEMENT    . LEFT HEART CATHETERIZATION WITH CORONARY ANGIOGRAM Bilateral 08/25/2012   Procedure: LEFT HEART CATHETERIZATION WITH CORONARY ANGIOGRAM;  Surgeon: Kathleene Hazel, MD;  Location: [facility] CATH LAB;  Service: Cardiovascular;  Laterality: Bilateral;       Family History  Problem Relation Age of Onset  . Heart disease Mother        MI  4s    Social History   Tobacco Use  . Smoking status: Former Smoker    Types: Cigarettes    Quit date: 11/25/2007    Years since quitting: 12.7  . Smokeless tobacco: Never Used  Vaping Use  . Vaping Use: Never used  Substance Use Topics  . Alcohol use: Yes    Comment: Used to drink heavily - says currently 2 beers/day.  . Drug use: Yes    Types: Marijuana    Comment: reports not using cocaine    Home Medications Prior to Admission medications   Medication Sig Start Date End Date Taking? Authorizing Provider   albuterol (PROVENTIL HFA;VENTOLIN HFA) 108 (90 BASE) MCG/ACT inhaler Inhale 2 puffs into the lungs every 6 (six) hours as needed for wheezing or shortness of breath.     [provider]  amLODipine (NORVASC) 5 MG tablet Take 1 tablet (5 mg total) by mouth daily. 05/01/19   Rhetta Mura, MD  atorvastatin (LIPITOR) 40 MG tablet Take 1 tablet (40 mg total) by mouth daily at 6 PM. 04/30/19   Rhetta Mura, MD  brimonidine (ALPHAGAN) 0.2 % ophthalmic solution Place 1 drop into both eyes 2 (two) times daily.  04/11/15   [provider]  budesonide-formoterol (SYMBICORT) 160-4.5 MCG/ACT inhaler Inhale 2 puffs into the lungs 2 (two) times daily.    [provider]  Cyanocobalamin (VITAMIN B-12 PO) Take 1 tablet by mouth daily.     [provider]  ferrous sulfate 325 (65 FE) MG tablet Take 325 mg by mouth daily with breakfast.    [provider]  folic acid (FOLVITE) 1 MG tablet Take 1 tablet (1 mg total) by mouth daily. 04/30/19   Rhetta Mura, MD  furosemide (LASIX) 40 MG tablet Take 1 tablet (40 mg total) by mouth every other day. 04/30/19   Rhetta Mura, MD  glucose 4 GM chewable tablet Chew 4 g by mouth daily as needed for low blood sugar.     [provider]  hydrALAZINE (APRESOLINE) 25 MG tablet Take 1 tablet (25 mg total) by mouth 3 (three) times daily. 09/12/19 09/06/20  [name], Deloris Ping, MD  insulin aspart (NOVOLOG) 100 UNIT/ML injection Inject 0-9 Units into the skin 3 (three) times daily with meals. 07/25/15   Kathlen Mody, MD  insulin glargine (LANTUS) 100 UNIT/ML injection Inject 0.3 mLs (30 Units total) into the skin daily before breakfast. 08/30/17   Zannie Cove, MD  latanoprost (XALATAN) 0.005 % ophthalmic solution Place 1 drop into both eyes at bedtime.  04/11/15   [provider]  ondansetron (ZOFRAN ODT) 4 MG disintegrating tablet Take 1 tablet (4 mg total) by mouth every 8 (eight) hours as needed for  nausea or vomiting. 06/16/20   Joy, Shawn C, PA-C  predniSONE (STERAPRED UNI-PAK 21 TAB) 10 MG (21) TBPK tablet Take 6 tabs (60mg ) day 1, 5 tabs (50mg ) day 2, 4 tabs (40mg ) day 3, 3 tabs (30mg ) day 4, 2 tabs (20mg ) day 5, and 1 tab (10mg ) day 6. 06/16/20   Joy, Shawn C, PA-C  traZODone (DESYREL) 150 MG tablet Take 75 mg by mouth at bedtime.     [provider]    Allergies    Lisinopril-hydrochlorothiazide and Simvastatin  Review of Systems   Review of Systems  Constitutional: Positive for diaphoresis.  Respiratory: Positive for shortness of breath.   Cardiovascular: Positive for chest pain.  All other systems reviewed and are negative.   Physical Exam Updated Vital Signs  BP (!) 192/93   Pulse 79   Temp 98.5 F (36.9 C) (Oral)   Resp 20   Ht 5\' 11"  (1.803 m)   Wt 79.8 kg   SpO2 98%   BMI 24.55 kg/m   Physical Exam Vitals and nursing note reviewed.  Constitutional:      General: He is not in acute distress.    Appearance: He is well-developed.  HENT:     Head: Normocephalic and atraumatic.  Eyes:     Conjunctiva/sclera: Conjunctivae normal.  Cardiovascular:     Rate and Rhythm: Normal rate and regular rhythm.     Pulses: Normal pulses.  Pulmonary:     Effort: Pulmonary effort is normal. No respiratory distress.     Breath sounds: Normal breath sounds.  Abdominal:     General: Bowel sounds are normal.     Palpations: Abdomen is soft.     Tenderness: There is no abdominal tenderness. There is no guarding or rebound.  Musculoskeletal:     Right lower leg: No edema.     Left lower leg: No edema.  Skin:    General: Skin is warm.  Neurological:     Mental Status: He is alert.  Psychiatric:        Behavior: Behavior normal.     ED Results / Procedures / Treatments   Labs (all labs ordered are listed, but only abnormal results are displayed) Labs Reviewed  BASIC METABOLIC PANEL - Abnormal; Notable for the following components:      Result Value   Sodium  130 (*)    CO2 20 (*)    Glucose, Bld 242 (*)    Creatinine, Ser 1.46 (*)    GFR calc non Af Amer 51 (*)    GFR calc Af Amer 59 (*)    All other components within normal limits  CBC - Abnormal; Notable for the following components:   HCT 38.0 (*)    MCV 78.4 (*)    All other components within normal limits  RAPID URINE DRUG SCREEN, HOSP PERFORMED - Abnormal; Notable for the following components:   Tetrahydrocannabinol POSITIVE (*)    All other components within normal limits  CBG MONITORING, ED - Abnormal; Notable for the following components:   Glucose-Capillary 171 (*)    All other components within normal limits  TROPONIN I (HIGH SENSITIVITY) - Abnormal; Notable for the following components:   Troponin I (High Sensitivity) 1,499 (*)    All other components within normal limits  TROPONIN I (HIGH SENSITIVITY) - Abnormal; Notable for the following components:   Troponin I (High Sensitivity) 1,729 (*)    All other components within normal limits  RESPIRATORY PANEL BY RT PCR (FLU A&B, COVID)  HEPARIN LEVEL (UNFRACTIONATED)  CBC    EKG EKG Interpretation  Date/Time:  Tuesday August 21 2020 14:25:15 EDT Ventricular Rate:  76 PR Interval:  182 QRS Duration: 88 QT Interval:  370 QTC Calculation: 416 R Axis:   33 Text Interpretation: Normal sinus rhythm T wave abnormality Early repolarization pattern Abnormal ECG Confirmed by 10[birth year]25[birth year]1994 [phone]) on 08/21/2020 7:20:27 PM   Radiology DG Chest 2 View  Result Date: 08/21/2020 CLINICAL DATA:  LEFT chest pain radiating to anterior neck for 2 weeks, history coronary disease post stenting, hypertension, diabetes mellitus, COPD, kidney disease, alcohol and substance abuse EXAM: CHEST - 2 VIEW COMPARISON:  12/18/2019 FINDINGS: Normal heart size, mediastinal contours, and pulmonary vascularity. Minimal bibasilar scarring. Lungs otherwise clear. No acute infiltrate, pleural effusion  or pneumothorax. Osseous structures unremarkable.  IMPRESSION: No acute abnormalities. Electronically Signed   By: Ulyses SouthwardMark  Boles M.D.   On: 08/21/2020 15:15    Procedures .Critical Care Performed by: Jotham Ahn, [place] N, PA-C Authorized by: Merlen Gurry, [place] N, PA-C   Critical care provider statement:    Critical care time (minutes):  45   Critical care time was exclusive of:  Separately billable procedures and treating other patients and teaching time   Critical care was necessary to treat or prevent imminent or life-threatening deterioration of the following conditions:  Cardiac failure   Critical care was time spent personally by me on the following activities:  Discussions with consultants, evaluation of patient's response to treatment, examination of patient, ordering and performing treatments and interventions, ordering and review of laboratory studies, ordering and review of radiographic studies, pulse oximetry, re-evaluation of patient's condition, obtaining history from patient or surrogate and review of old charts   I assumed direction of critical care for this patient from another provider in my specialty: no     (including critical care time)  Medications Ordered in ED Medications  heparin ADULT infusion 100 units/mL (25000 units/29450mL sodium chloride 0.45%) (950 Units/hr Intravenous New Bag/Given 08/21/20 2025)  nitroPRUSSide (NIPRIDE) 20 mg in NS 100 mL (0.2 mg/mL) infusion (has no administration in time range)  heparin bolus via infusion 4,000 Units (4,000 Units Intravenous Bolus from Bag 08/21/20 2028)    ED Course  I have reviewed the triage vital signs and the nursing notes.  Pertinent labs & imaging results that were available during my care of the patient were reviewed by me and considered in my medical decision making (see chart for details).  Clinical Course as of Aug 21 2048  Tue Aug 21, 2020  1830 Pt evaluated. Concern for unstable angina given presentation .Will discussed with Dr. Jeraldine LootsLockwood and consult cardiology.    [JR]  1831 Troponin I (High Sensitivity)(!!): 1,499 [JR]  [id number] Cardiology consulted, to admit.   [JR]  1949 Cardiology requesting medicine admit due to multiple comorbidities   [JR]  2045 Cardiology requesting nitro drip.  Plans for catheterization in the morning.   [JR]    Clinical Course User Index [JR] Itzia Cunliffe, [place] N, PA-C   MDM Rules/Calculators/A&P                          Patient presenting with symptoms concerning for unstable angina as ACS.  He is having progressively worsening substernal chest pain both at rest and with exertion over the last couple of weeks, significantly worsening the last few days.  Pain radiates to his back, left arm and jaw.  He has cardiac history of 2 stents.  Initial EKG appears unchanged from previous.  Initial troponin is significantly elevated at 1500.  Heparin ordered.  He does have some hyperglycemia with normal gap.  Pseudohyponatremia, corrected sodium is only slightly low at 133.  UDS is positive for marijuana, negative for cocaine.  Cardiology consulted, evaluated patient and recommending medical admission.  Recommends nitro drip, plans for catheterization in the morning.  Appreciate consult.  Patient admitted to hospital service for further management.   Final Clinical Impression(s) / ED Diagnoses Final diagnoses:  ACS (acute coronary syndrome) [facility](HCC)    Rx / DC Orders ED Discharge Orders    None       Mellie Buccellato, [place] N, PA-C 08/22/20 0019    Gerhard MunchLockwood, Robert, MD 08/22/20 2245

## 2020-08-21 NOTE — ED Triage Notes (Signed)
Patient c/o chest pain x 4 days; comes and go in nature and worst after eating. Stated unable to relieve by pepto or tums; patient also endorsed hx of heart stent x 2

## 2020-08-22 ENCOUNTER — Encounter (HOSPITAL_COMMUNITY)
Admission: EM | Disposition: A | Discharge status: Home / Self Care | Payer: Self-pay | Source: Home / Self Care | Attending: Internal Medicine

## 2020-08-22 ENCOUNTER — Observation Stay (HOSPITAL_COMMUNITY): Payer: Non-veteran care

## 2020-08-22 DIAGNOSIS — F1290 Cannabis use, unspecified, uncomplicated: Secondary | ICD-10-CM | POA: Diagnosis present

## 2020-08-22 DIAGNOSIS — I214 Non-ST elevation (NSTEMI) myocardial infarction: Secondary | ICD-10-CM | POA: Diagnosis not present

## 2020-08-22 DIAGNOSIS — I25110 Atherosclerotic heart disease of native coronary artery with unstable angina pectoris: Secondary | ICD-10-CM | POA: Diagnosis present

## 2020-08-22 DIAGNOSIS — Z9119 Patient's noncompliance with other medical treatment and regimen: Secondary | ICD-10-CM | POA: Diagnosis not present

## 2020-08-22 DIAGNOSIS — F1010 Alcohol abuse, uncomplicated: Secondary | ICD-10-CM | POA: Diagnosis present

## 2020-08-22 DIAGNOSIS — I10 Essential (primary) hypertension: Secondary | ICD-10-CM | POA: Diagnosis not present

## 2020-08-22 DIAGNOSIS — F1410 Cocaine abuse, uncomplicated: Secondary | ICD-10-CM | POA: Diagnosis present

## 2020-08-22 DIAGNOSIS — H35033 Hypertensive retinopathy, bilateral: Secondary | ICD-10-CM | POA: Diagnosis present

## 2020-08-22 DIAGNOSIS — Z20822 Contact with and (suspected) exposure to covid-19: Secondary | ICD-10-CM | POA: Diagnosis present

## 2020-08-22 DIAGNOSIS — J449 Chronic obstructive pulmonary disease, unspecified: Secondary | ICD-10-CM | POA: Diagnosis present

## 2020-08-22 DIAGNOSIS — I161 Hypertensive emergency: Secondary | ICD-10-CM | POA: Diagnosis not present

## 2020-08-22 DIAGNOSIS — F32A Depression, unspecified: Secondary | ICD-10-CM | POA: Diagnosis present

## 2020-08-22 DIAGNOSIS — I493 Ventricular premature depolarization: Secondary | ICD-10-CM | POA: Diagnosis present

## 2020-08-22 DIAGNOSIS — I2510 Atherosclerotic heart disease of native coronary artery without angina pectoris: Secondary | ICD-10-CM | POA: Diagnosis not present

## 2020-08-22 DIAGNOSIS — N183 Chronic kidney disease, stage 3 unspecified: Secondary | ICD-10-CM

## 2020-08-22 DIAGNOSIS — E1165 Type 2 diabetes mellitus with hyperglycemia: Secondary | ICD-10-CM | POA: Diagnosis present

## 2020-08-22 DIAGNOSIS — N1831 Chronic kidney disease, stage 3a: Secondary | ICD-10-CM | POA: Diagnosis present

## 2020-08-22 DIAGNOSIS — I129 Hypertensive chronic kidney disease with stage 1 through stage 4 chronic kidney disease, or unspecified chronic kidney disease: Secondary | ICD-10-CM | POA: Diagnosis present

## 2020-08-22 DIAGNOSIS — E1122 Type 2 diabetes mellitus with diabetic chronic kidney disease: Secondary | ICD-10-CM | POA: Diagnosis present

## 2020-08-22 DIAGNOSIS — E871 Hypo-osmolality and hyponatremia: Secondary | ICD-10-CM | POA: Diagnosis present

## 2020-08-22 DIAGNOSIS — E785 Hyperlipidemia, unspecified: Secondary | ICD-10-CM | POA: Diagnosis not present

## 2020-08-22 HISTORY — PX: LEFT HEART CATH AND CORONARY ANGIOGRAPHY: CATH118249

## 2020-08-22 LAB — CBC
HCT: 39.4 % (ref 39.0–52.0)
Hemoglobin: 14.2 g/dL (ref 13.0–17.0)
MCH: 28.1 pg (ref 26.0–34.0)
MCHC: 36 g/dL (ref 30.0–36.0)
MCV: 77.9 fL — ABNORMAL LOW (ref 80.0–100.0)
Platelets: 206 10*3/uL (ref 150–400)
RBC: 5.06 MIL/uL (ref 4.22–5.81)
RDW: 13.2 % (ref 11.5–15.5)
WBC: 7.1 10*3/uL (ref 4.0–10.5)
nRBC: 0 % (ref 0.0–0.2)

## 2020-08-22 LAB — HEPATIC FUNCTION PANEL
ALT: 22 U/L (ref 0–44)
AST: 29 U/L (ref 15–41)
Albumin: 3.9 g/dL (ref 3.5–5.0)
Alkaline Phosphatase: 53 U/L (ref 38–126)
Bilirubin, Direct: <0.1 mg/dL (ref 0.0–0.2)
Total Bilirubin: 0.6 mg/dL (ref 0.3–1.2)
Total Protein: 6.5 g/dL (ref 6.5–8.1)

## 2020-08-22 LAB — GLUCOSE, CAPILLARY: Glucose-Capillary: 265 mg/dL — ABNORMAL HIGH (ref 70–99)

## 2020-08-22 LAB — BASIC METABOLIC PANEL
Anion gap: 13 (ref 5–15)
BUN: 14 mg/dL (ref 8–23)
CO2: 22 mmol/L (ref 22–32)
Calcium: 9.9 mg/dL (ref 8.9–10.3)
Chloride: 100 mmol/L (ref 98–111)
Creatinine, Ser: 1.5 mg/dL — ABNORMAL HIGH (ref 0.61–1.24)
GFR calc Af Amer: 57 mL/min — ABNORMAL LOW (ref >60)
GFR calc non Af Amer: 50 mL/min — ABNORMAL LOW (ref >60)
Glucose, Bld: 160 mg/dL — ABNORMAL HIGH (ref 70–99)
Potassium: 3.6 mmol/L (ref 3.5–5.1)
Sodium: 135 mmol/L (ref 135–145)

## 2020-08-22 LAB — CBG MONITORING, ED
Glucose-Capillary: 136 mg/dL — ABNORMAL HIGH (ref 70–99)
Glucose-Capillary: 159 mg/dL — ABNORMAL HIGH (ref 70–99)
Glucose-Capillary: 66 mg/dL — ABNORMAL LOW (ref 70–99)
Glucose-Capillary: 70 mg/dL (ref 70–99)

## 2020-08-22 LAB — POCT ACTIVATED CLOTTING TIME
Activated Clotting Time: 285 seconds
Activated Clotting Time: 318 seconds
Activated Clotting Time: 373 seconds

## 2020-08-22 LAB — ECHOCARDIOGRAM COMPLETE
Area-P 1/2: 2.62 cm2
Height: 71 in
S' Lateral: 2.5 cm
Weight: 2816 oz

## 2020-08-22 LAB — HEMOGLOBIN A1C
Hgb A1c MFr Bld: 7.6 % — ABNORMAL HIGH (ref 4.8–5.6)
Mean Plasma Glucose: 171.42 mg/dL

## 2020-08-22 LAB — PHOSPHORUS: Phosphorus: 3.3 mg/dL (ref 2.5–4.6)

## 2020-08-22 LAB — MAGNESIUM: Magnesium: 2.3 mg/dL (ref 1.7–2.4)

## 2020-08-22 LAB — TROPONIN I (HIGH SENSITIVITY): Troponin I (High Sensitivity): 1209 ng/L (ref <18)

## 2020-08-22 LAB — HEPARIN LEVEL (UNFRACTIONATED)
Heparin Unfractionated: 0.75 IU/mL — ABNORMAL HIGH (ref 0.30–0.70)
Heparin Unfractionated: 0.5 IU/mL (ref 0.30–0.70)

## 2020-08-22 LAB — LIPID PANEL
Cholesterol: 202 mg/dL — ABNORMAL HIGH (ref 0–200)
HDL: 62 mg/dL (ref >40)
LDL Cholesterol: 131 mg/dL — ABNORMAL HIGH (ref 0–99)
Total CHOL/HDL Ratio: 3.3 RATIO
Triglycerides: 44 mg/dL (ref <150)
VLDL: 9 mg/dL (ref 0–40)

## 2020-08-22 LAB — HIV ANTIBODY (ROUTINE TESTING W REFLEX): HIV Screen 4th Generation wRfx: NONREACTIVE

## 2020-08-22 SURGERY — LEFT HEART CATH AND CORONARY ANGIOGRAPHY
Anesthesia: LOCAL

## 2020-08-22 MED ORDER — LIDOCAINE HCL (PF) 1 % IJ SOLN
INTRAMUSCULAR | Status: DC | PRN
  Administered 2020-08-22: 2 mL

## 2020-08-22 MED ORDER — ASPIRIN 81 MG PO CHEW
81.0000 mg | CHEWABLE_TABLET | ORAL | Status: AC
  Administered 2020-08-22: 81 mg via ORAL
  Filled 2020-08-22: qty 1

## 2020-08-22 MED ORDER — HEPARIN SODIUM (PORCINE) 1000 UNIT/ML IJ SOLN
INTRAMUSCULAR | Status: AC
  Filled 2020-08-22: qty 1

## 2020-08-22 MED ORDER — HYDRALAZINE HCL 20 MG/ML IJ SOLN
INTRAMUSCULAR | Status: AC
  Filled 2020-08-22: qty 1

## 2020-08-22 MED ORDER — VERAPAMIL HCL 2.5 MG/ML IV SOLN
INTRAVENOUS | Status: AC
  Filled 2020-08-22: qty 2

## 2020-08-22 MED ORDER — HEPARIN (PORCINE) IN NACL 1000-0.9 UT/500ML-% IV SOLN
INTRAVENOUS | Status: AC
  Filled 2020-08-22: qty 1000

## 2020-08-22 MED ORDER — FENTANYL CITRATE (PF) 100 MCG/2ML IJ SOLN
INTRAMUSCULAR | Status: AC
  Filled 2020-08-22: qty 2

## 2020-08-22 MED ORDER — SODIUM CHLORIDE 0.9 % IV SOLN: INTRAVENOUS | Status: DC

## 2020-08-22 MED ORDER — INSULIN GLARGINE 100 UNIT/ML ~~LOC~~ SOLN
10.0000 [IU] | Freq: Every day | SUBCUTANEOUS | Status: DC
  Filled 2020-08-22 (×3): qty 0.1

## 2020-08-22 MED ORDER — MIDAZOLAM HCL 2 MG/2ML IJ SOLN
INTRAMUSCULAR | Status: DC | PRN
  Administered 2020-08-22 (×3): 1 mg via INTRAVENOUS

## 2020-08-22 MED ORDER — SODIUM CHLORIDE 0.9 % WEIGHT BASED INFUSION: 1.0000 mL/kg/h | INTRAVENOUS | Status: AC

## 2020-08-22 MED ORDER — FENTANYL CITRATE (PF) 100 MCG/2ML IJ SOLN
INTRAMUSCULAR | Status: DC | PRN
Start: 2020-08-22 — End: 2020-08-22
  Administered 2020-08-22 (×2): 25 ug via INTRAVENOUS
  Administered 2020-08-22: 50 ug via INTRAVENOUS

## 2020-08-22 MED ORDER — CLOPIDOGREL BISULFATE 300 MG PO TABS
ORAL_TABLET | ORAL | Status: AC
  Filled 2020-08-22: qty 1

## 2020-08-22 MED ORDER — IOHEXOL 350 MG/ML SOLN
INTRAVENOUS | Status: DC | PRN
  Administered 2020-08-22: 190 mL

## 2020-08-22 MED ORDER — CLOPIDOGREL BISULFATE 75 MG PO TABS
75.0000 mg | ORAL_TABLET | Freq: Every day | ORAL | Status: DC
  Administered 2020-08-23 – 2020-08-24 (×2): 75 mg via ORAL
  Filled 2020-08-22 (×2): qty 1

## 2020-08-22 MED ORDER — HEPARIN (PORCINE) IN NACL 1000-0.9 UT/500ML-% IV SOLN
INTRAVENOUS | Status: DC | PRN
  Administered 2020-08-22 (×2): 500 mL

## 2020-08-22 MED ORDER — OXYCODONE HCL 5 MG PO TABS: 5.0000 mg | ORAL_TABLET | ORAL | Status: DC | PRN

## 2020-08-22 MED ORDER — ASPIRIN 81 MG PO CHEW: 81.0000 mg | CHEWABLE_TABLET | ORAL | Status: DC

## 2020-08-22 MED ORDER — MIDAZOLAM HCL 2 MG/2ML IJ SOLN
INTRAMUSCULAR | Status: AC
  Filled 2020-08-22: qty 2

## 2020-08-22 MED ORDER — ACETAMINOPHEN 325 MG PO TABS: 650.0000 mg | ORAL_TABLET | ORAL | Status: DC | PRN

## 2020-08-22 MED ORDER — SODIUM CHLORIDE 0.9% FLUSH: 3.0000 mL | INTRAVENOUS | Status: DC | PRN

## 2020-08-22 MED ORDER — SODIUM CHLORIDE 0.9 % IV SOLN: 250.0000 mL | INTRAVENOUS | Status: DC | PRN

## 2020-08-22 MED ORDER — HEPARIN SODIUM (PORCINE) 1000 UNIT/ML IJ SOLN
INTRAMUSCULAR | Status: DC | PRN
  Administered 2020-08-22: 3500 [IU] via INTRAVENOUS
  Administered 2020-08-22: 5000 [IU] via INTRAVENOUS
  Administered 2020-08-22: 4000 [IU] via INTRAVENOUS

## 2020-08-22 MED ORDER — LABETALOL HCL 5 MG/ML IV SOLN: 10.0000 mg | INTRAVENOUS | Status: DC | PRN

## 2020-08-22 MED ORDER — DEXTROSE 50 % IV SOLN
INTRAVENOUS | Status: AC
  Administered 2020-08-22: 25 mL
  Filled 2020-08-22: qty 50

## 2020-08-22 MED ORDER — LIDOCAINE HCL (PF) 1 % IJ SOLN
INTRAMUSCULAR | Status: AC
  Filled 2020-08-22: qty 30

## 2020-08-22 MED ORDER — ASPIRIN 81 MG PO CHEW
81.0000 mg | CHEWABLE_TABLET | Freq: Every day | ORAL | Status: DC
  Administered 2020-08-23 – 2020-08-24 (×2): 81 mg via ORAL
  Filled 2020-08-22 (×2): qty 1

## 2020-08-22 MED ORDER — VERAPAMIL HCL 2.5 MG/ML IV SOLN
INTRAVENOUS | Status: DC | PRN
  Administered 2020-08-22: 10 mL via INTRA_ARTERIAL

## 2020-08-22 MED ORDER — CLOPIDOGREL BISULFATE 300 MG PO TABS
ORAL_TABLET | ORAL | Status: DC | PRN
  Administered 2020-08-22: 600 mg via ORAL

## 2020-08-22 MED ORDER — SODIUM CHLORIDE 0.9% FLUSH
3.0000 mL | Freq: Two times a day (BID) | INTRAVENOUS | Status: DC
  Administered 2020-08-23 – 2020-08-24 (×2): 3 mL via INTRAVENOUS

## 2020-08-22 MED ORDER — HEPARIN SODIUM (PORCINE) 5000 UNIT/ML IJ SOLN
5000.0000 [IU] | Freq: Three times a day (TID) | INTRAMUSCULAR | Status: DC
  Administered 2020-08-23 (×3): 5000 [IU] via SUBCUTANEOUS
  Filled 2020-08-22 (×3): qty 1

## 2020-08-22 MED ORDER — HYDRALAZINE HCL 20 MG/ML IJ SOLN
10.0000 mg | INTRAMUSCULAR | Status: DC | PRN
  Administered 2020-08-22 (×3): 10 mg via INTRAVENOUS

## 2020-08-22 MED ORDER — ONDANSETRON HCL 4 MG/2ML IJ SOLN: 4.0000 mg | Freq: Four times a day (QID) | INTRAMUSCULAR | Status: DC | PRN

## 2020-08-22 MED ORDER — PANTOPRAZOLE SODIUM 40 MG IV SOLR
40.0000 mg | INTRAVENOUS | Status: DC
  Administered 2020-08-22 – 2020-08-23 (×2): 40 mg via INTRAVENOUS
  Filled 2020-08-22 (×2): qty 40

## 2020-08-22 SURGICAL SUPPLY — 23 items
BALLN SAPPHIRE 2.0X12 (BALLOONS) ×2
BALLN SAPPHIRE 3.0X12 (BALLOONS) ×2
BALLN SAPPHIRE ~~LOC~~ 3.0X12 (BALLOONS) ×1 IMPLANT
BALLOON SAPPHIRE 2.0X12 (BALLOONS) IMPLANT
BALLOON SAPPHIRE 3.0X12 (BALLOONS) IMPLANT
CATH 5FR JL3.5 JR4 ANG PIG MP (CATHETERS) ×1 IMPLANT
CATH LAUNCHER 5F EBU3.5 (CATHETERS) ×1 IMPLANT
CATH VISTA GUIDE 6FR XBRCA (CATHETERS) ×1 IMPLANT
DEVICE RAD COMP TR BAND LRG (VASCULAR PRODUCTS) ×1 IMPLANT
GLIDESHEATH SLEND A-KIT 6F 22G (SHEATH) ×1 IMPLANT
GUIDELINER 6F (CATHETERS) ×1 IMPLANT
GUIDEWIRE INQWIRE 1.5J.035X260 (WIRE) IMPLANT
GUIDEWIRE PRESSURE COMET II (WIRE) ×1 IMPLANT
INQWIRE 1.5J .035X260CM (WIRE) ×2
KIT ENCORE 26 ADVANTAGE (KITS) ×1 IMPLANT
KIT ESSENTIALS PG (KITS) ×2 IMPLANT
KIT HEART LEFT (KITS) ×2 IMPLANT
PACK CARDIAC CATHETERIZATION (CUSTOM PROCEDURE TRAY) ×2 IMPLANT
SHEATH PROBE COVER 6X72 (BAG) ×1 IMPLANT
TRANSDUCER W/STOPCOCK (MISCELLANEOUS) ×2 IMPLANT
TUBING CIL FLEX 10 FLL-RA (TUBING) ×2 IMPLANT
WIRE ASAHI PROWATER 180CM (WIRE) ×1 IMPLANT
WIRE HI TORQ VERSACORE-J 145CM (WIRE) ×1 IMPLANT

## 2020-08-22 NOTE — ED Notes (Signed)
Pt initial CBG 66MG /dl- pt asymptomatic, hypoglycemic protocol initiated. Pt is NPO -12.5gm D50 IV given. CBG rechecked, 136 mg/dl.

## 2020-08-22 NOTE — Progress Notes (Addendum)
Progress Note  Patient Name: Jose [name] Date of Encounter: 08/22/2020  Primary Cardiologist:  Kristeen Miss, MD  Subjective   Feels better today, is hungry, wants food.   Inpatient Medications    Scheduled Meds: . amLODipine  10 mg Oral Daily  . aspirin  324 mg Oral NOW   Or  . aspirin  300 mg Rectal NOW  . aspirin EC  81 mg Oral Daily  . atorvastatin  40 mg Oral q1800  . brimonidine  1 drop Both Eyes BID  . ferrous sulfate  325 mg Oral Q breakfast  . folic acid  1 mg Oral Daily  . furosemide  40 mg Oral QODAY  . hydrALAZINE  25 mg Oral TID  . insulin aspart  0-15 Units Subcutaneous TID WC  . insulin glargine  30 Units Subcutaneous QAC breakfast  . latanoprost  1 drop Both Eyes QHS  . LORazepam  0-4 mg Intravenous Q4H   Followed by  . [START ON 08/23/2020] LORazepam  0-4 mg Intravenous Q8H  . metoprolol tartrate  25 mg Oral BID  . mometasone-formoterol  2 puff Inhalation BID  . multivitamin with minerals  1 tablet Oral Daily  . pantoprazole (PROTONIX) IV  40 mg Intravenous Q24H  . thiamine  100 mg Oral Daily   Or  . thiamine  100 mg Intravenous Daily  . traZODone  75 mg Oral QHS   Continuous Infusions: . sodium chloride 100 mL/hr at 08/22/20 0710  . heparin 850 Units/hr (08/22/20 0419)  . nitroGLYCERIN 5 mcg/min (08/21/20 2251)   PRN Meds: acetaminophen **OR** acetaminophen, albuterol, ALPRAZolam, LORazepam **OR** LORazepam, ondansetron (ZOFRAN) IV   Vital Signs    Vitals:   08/22/20 0630 08/22/20 0645 08/22/20 0652 08/22/20 0700  BP: 140/84 140/90 140/90 138/90  Pulse: 66 67 67 64  Resp: 15 17  13   Temp:      TempSrc:      SpO2: 96% 95%  96%  Weight:      Height:       No intake or output data in the 24 hours ending 08/22/20 0758 Filed Weights   08/21/20 1431  Weight: 79.8 kg   Last Weight  Most recent update: 08/21/2020  2:32 PM   Weight  79.8 kg (176 lb)           Weight change:    Telemetry    SR - Personally Reviewed  ECG     None today - Personally Reviewed  Physical Exam   General: Well developed, well nourished, male appearing in no acute distress. Head: Normocephalic, atraumatic.  Neck: Supple without bruits, JVD not elevated. Lungs:  Resp regular and unlabored, CTA. Heart: RRR, S1, S2, no S3, S4, or murmur; no rub. Abdomen: Soft, non-tender, non-distended with normoactive bowel sounds. No hepatomegaly. No rebound/guarding. No obvious abdominal masses. Extremities: No clubbing, cyanosis, no edema. Distal pedal pulses are 2+ bilaterally. Neuro: Alert and oriented X 3. Moves all extremities spontaneously. Psych: Normal affect.  Labs    Hematology Recent Labs  Lab 08/21/20 1436 08/22/20 0535  WBC 7.3 7.1  RBC 4.85 5.06  HGB 13.6 14.2  HCT 38.0* 39.4  MCV 78.4* 77.9*  MCH 28.0 28.1  MCHC 35.8 36.0  RDW 13.2 13.2  PLT 209 206    Chemistry Recent Labs  Lab 08/21/20 1436 08/21/20 2347 08/22/20 0535  NA 130*  --  135  K 3.8  --  3.6  CL 99  --  100  CO2 20*  --  22  GLUCOSE 242*  --  160*  BUN 15  --  14  CREATININE 1.46*  --  1.50*  CALCIUM 9.9  --  9.9  PROT  --  6.5  --   ALBUMIN  --  3.9  --   AST  --  29  --   ALT  --  22  --   ALKPHOS  --  53  --   BILITOT  --  0.6  --   GFRNONAA 51*  --  50*  GFRAA 59*  --  57*  ANIONGAP 11  --  13     High Sensitivity Troponin:   Recent Labs  Lab 08/21/20 1436 08/21/20 1930 08/22/20 0535  TROPONINIHS 1,499* 1,729* 1,209*      BNPNo results for input(s): BNP, PROBNP in the last 168 hours.   Lab Results  Component Value Date   CHOL 129 08/26/2012   HDL 37 (L) 08/26/2012   LDLCALC 72 08/26/2012   TRIG 98 08/26/2012   CHOLHDL 3.5 08/26/2012   Drugs of Abuse     Component Value Date/Time   LABOPIA NONE DETECTED 08/21/2020 1915   COCAINSCRNUR NONE DETECTED 08/21/2020 1915   LABBENZ NONE DETECTED 08/21/2020 1915   AMPHETMU NONE DETECTED 08/21/2020 1915   THCU POSITIVE (A) 08/21/2020 1915   LABBARB NONE DETECTED 08/21/2020  1915    Lab Results  Component Value Date   TSH 1.349 05/13/2019   Lab Results  Component Value Date   HGBA1C 7.6 (H) 08/21/2020    Radiology    DG Chest 2 View  Result Date: 08/21/2020 CLINICAL DATA:  LEFT chest pain radiating to anterior neck for 2 weeks, history coronary disease post stenting, hypertension, diabetes mellitus, COPD, kidney disease, alcohol and substance abuse EXAM: CHEST - 2 VIEW COMPARISON:  12/18/2019 FINDINGS: Normal heart size, mediastinal contours, and pulmonary vascularity. Minimal bibasilar scarring. Lungs otherwise clear. No acute infiltrate, pleural effusion or pneumothorax. Osseous structures unremarkable. IMPRESSION: No acute abnormalities. Electronically Signed   By: Ulyses Southward M.D.   On: 08/21/2020 15:15     Cardiac Studies   ECHO:  Ordered  CARDIAC CATH: Ordered  Patient Profile     59 y.o. 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   Admitted 09/28 with inf TWI on ECG, NSTEMI by ez, cath planned.  Assessment & Plan    1. NSTEMI - pain-free on ASA, Hep, Nitro, BB, statin - echo and cath today - continue current therapy. - ck lipid profile  2. Hx drug use - UDS + only for [facility] this admit - per IM  3. CKD III - hydrate for cath, Cr generally runs 1.4-1.7  - hold Lasix today, not on ACE/ARB  Otherwise, per IM Principal Problem:   Acute coronary syndrome (HCC) Active Problems:   Hyponatremia   Cocaine abuse (HCC)   Alcohol abuse   Uncontrolled type 2 diabetes mellitus with diabetic nephropathy, with long-term current use of insulin (HCC)   CKD (chronic kidney disease) stage 3, GFR 30-59 ml/min   COPD (chronic obstructive pulmonary disease) (HCC)   Elevated troponin   Dyslipidemia   Hypertensive  retinopathy of both eyes   Hypertensive emergency   Depression   Signed, Theodore Demark , PA-C 7:58  AM 08/22/2020 Pager: [phone]  Patient seen and examined.  Agree with above documentation.  On exam, patient is alert and oriented, regular rate and rhythm, no murmurs, lungs CTAB, no LE edema or JVD.  He is chest pain-free on nitroglycerin drip at 5 mcg/min.  Plan cardiac catheterization today.  Risks and benefits of cardiac catheterization have been discussed with the patient.  These include bleeding, infection, kidney damage, stroke, heart attack, death.  The patient understands these risks and is willing to proceed.  Little Ishikawa, MD

## 2020-08-22 NOTE — ED Notes (Signed)
Gotten blood but wasnot enough for gold tops nurse is awear

## 2020-08-22 NOTE — Progress Notes (Signed)
ANTICOAGULATION CONSULT NOTE  Pharmacy Consult for IV heparin Indication: chest pain/ACS  Allergies  Allergen Reactions  . Lisinopril-Hydrochlorothiazide Swelling    Causes swelling of lips  . Simvastatin Other (See Comments)    Reported by [facility] 04/10/16 - unknown reaction    Patient Measurements: Height: 5\' 11"  (180.3 cm) Weight: 79.8 kg (176 lb) IBW/kg (Calculated) : 75.3 Heparin Dosing Weight: 79.8kg  Vital Signs: BP: 140/90 (09/29 0325) Pulse Rate: 61 (09/29 0325)  Labs: Recent Labs    08/21/20 1436 08/21/20 1930 08/22/20 0258  HGB 13.6  --   --   HCT 38.0*  --   --   PLT 209  --   --   HEPARINUNFRC  --   --  0.75*  CREATININE 1.46*  --   --   TROPONINIHS 1,499* 1,729*  --     Estimated Creatinine Clearance: 56.6 mL/min (A) (by C-G formula based on SCr of 1.46 mg/dL (H)).   Medical History: Past Medical History:  Diagnosis Date  . Alcohol abuse   . CAD (coronary artery disease)    a. Reported MI in 2012 s/p 2 stents;  b. 08/2012 Cath: LM 20, LAD 20 diff ISR, jailed septal - 99%, LCX 30ost, RI large, min irregs, RCA 30p, 20-30 ISR-->Med Rx; c. 2017 [facility].  2018 CKD (chronic kidney disease), stage III   . Cocaine abuse (HCC)   . COPD (chronic obstructive pulmonary disease) (HCC)   . Depression   . Diabetes mellitus without complication (HCC)   . Hyperlipidemia   . Hypertension   . Noncompliance     Medications:  Scheduled:  . amLODipine  10 mg Oral Daily  . aspirin  324 mg Oral NOW   Or  . aspirin  300 mg Rectal NOW  . aspirin EC  81 mg Oral Daily  . atorvastatin  40 mg Oral q1800  . brimonidine  1 drop Both Eyes BID  . ferrous sulfate  325 mg Oral Q breakfast  . folic acid  1 mg Oral Daily  . furosemide  40 mg Oral QODAY  . hydrALAZINE  25 mg Oral TID  . insulin aspart  0-15 Units Subcutaneous TID WC  . insulin glargine  30 Units Subcutaneous QAC breakfast  . latanoprost  1 drop Both Eyes QHS  .  LORazepam  0-4 mg Intravenous Q4H   Followed by  . [START ON 08/23/2020] LORazepam  0-4 mg Intravenous Q8H  . metoprolol tartrate  25 mg Oral BID  . mometasone-formoterol  2 puff Inhalation BID  . multivitamin with minerals  1 tablet Oral Daily  . pantoprazole (PROTONIX) IV  40 mg Intravenous Q24H  . thiamine  100 mg Oral Daily   Or  . thiamine  100 mg Intravenous Daily  . traZODone  75 mg Oral QHS    Assessment: 59yoM presenting with chest pain x2 weeks, PMH of heart stents and hypertension. Troponins elevated ~1500. CBC WNL. Pharmacy to dose IV heparin for ACS.  9/29 AM update:  Heparin level elevated  Goal of Therapy:  Heparin level 0.3-0.7 units/ml Monitor platelets by anticoagulation protocol: Yes   Plan:  Dec heparin to 850 units/hr 1200 heparin level  10/29, PharmD, BCPS Clinical Pharmacist Phone: [phone]

## 2020-08-22 NOTE — Progress Notes (Signed)
CSW received consult for patient for substance abuse counseling due to his active marijuana use. CSW met with patient at bedside to complete discussion - patient reports he has been smoking marijuana on and off for the last three months. CSW encouraged patient to cease his marijuana use. Patient denies using any tobacco products. Patient states he was previously drinking a six pack of Coors Light daily but has decreased his alcohol intake to two beers daily. Patient states he lives alone in an apartment that he feels safe at. Patient reports he receives services from the [place] in [place] including transportation and medication assistance. Patient denied any further needs or concerns at this time.   Madilyn Fireman, MSW, LCSW-A Transitions of Care  Clinical Social Worker  [facility] Emergency Departments  Medical ICU [phone]

## 2020-08-22 NOTE — Progress Notes (Addendum)
PROGRESS NOTE    Jose [name]  WUG:[id number] DOB: 01-Jul-1961 DOA: 08/21/2020 PCP: [name]   Chief Complaint  Patient presents with  . Chest Pain   Brief Narrative:  59 y.o. male with medical history significant of alcohol abuse, CAD, stage III CKD, cocaine abuse, COPD, depression, type 2 diabetes, hyperlipidemia, hypertension, medication and treatment noncompliance who is coming to the emergency department with complaints of chest pain since about 2 weeks ago, but has gotten particularly worse in the past 2 days.  He describes the pain as a heaviness on his chest associated with dyspnea and nausea.  The pain radiates to his left neck, shoulder and arm.  It gets worse when he eats and when he lies down.  He denies palpitations, diaphoresis, PND or pitting edema of the lower extremities.  Has had epigastric pain that has not responded to Tums at home.  He denies emesis, diarrhea, constipation, melena or hematochezia.  No fever, chills, rhinorrhea, sore throat, wheezing or hemoptysis.  He denies dysuria, frequency hematuria.  No polyuria, polydipsia, polyphagia or blurred vision.  He states that his blood glucose has been under well controlled recently.  He admits to using cocaine on Sunday night and drinks beer daily.  Although he mentions he has not used cocaine in the previous 2 weeks and decrease his beer drinking from a sixpack every evening to only 2 beers a night.  ED course:Initial vital signs in the emergency department were temperature 98.5 F,pulse 72,respiration 18, BP 150/95 mmHg and O2 sat 100% on room air.The patient was started on heparin and nitroglycerin infusion.He was seen by cardiology.  His UDS was positive for THC.  CBC shows a white count 7.3, hemoglobin 13.6 g/dL and platelets 888.  LFTs were normal.  Sodium is 130 and CO2 20 mmol/L.  The rest of the electrolytes are within normal range.  Glucose 242, BUN 15 and creatinine 1.46 mg/dL.  EKG was NSR with T  wave inversion on inferior leads.  Troponin was 1499 on the first sample and about 3 hours later increased to 1729 ng/L.  His chest radiograph did not show any acute abnormality  Subjective: Resting comfortably on heparin drip and nitroglycerin drip.  Denies any chest pain. He stated he has cut down alcohol drink to 2 beers a day from 6 beer and was using marijuana but has cut down his crack reports few days ago he did "1 line of crack'-felt heart palpitations/chest pain and told his friend that he is not going to do it again. Feeling hungry this morning.Seen by cardiology, is on heparin drip.  Assessment & Plan:  NSTEMI:Appreciate cardiology input, keep on heparin drip, nitro drip, metoprolol, Lipitor 40, aspirin.  Currently chest pain-free, further work-up with echocardiogram cardiac cath.  Check CK, lipid profile.  Hyponatremia: Improved to 135.  Initially referred by [facility] to ED for hyponatremia.  Likely from alcohol use to   Substance abuse/cocaine abuse/Alcohol abuse: UDS positive for [facility].  TOC consult.  Watch for withdrawal.  Hypertension on amlodipine, Lasix, hydralazine.  Blood pressure stable on amlodipine, hydralazine and metoprolol.  Hypertensive emergency/hypertensive retinopathy of eyes: BP is stable continue meds as above.  Uncontrolled type 2 diabetes mellitus with diabetic nephropathy, with long-term current use of insulin.  Hemoglobin A1c borderline 7.6, at home on insulin aspart sliding scale 3 times daily, Lantus 13 units daily in a.m.  Decrease Lantus to 10 units as he is n.p.o. for procedure.  Keep on sliding scale. Recent Labs  Lab 08/21/20 1612 08/22/20 0046 08/22/20 0744  GLUCAP 171* 70 159*   CKD stage 3 A, hydrate for cardiac cath creatinine generally 1.4-1.7.  Monitor.  Holding Lasix.  Not on ACE or ARB. Recent Labs  Lab 08/21/20 1436 08/22/20 0535  BUN 15 14  CREATININE 1.46* 1.50*   COPD: Continue home inhalers/ICS, bronchodilators.  Dyslipidemia:  Continue Lipitor, follow FLP  Depression: cont trazodone at bedtime.  DVT prophylaxis: heparin gtt Code Status:   Code Status: Full Code  Family Communication: plan of care discussed with patient at bedside.  Status is: admitted as Observation Remains hospitalized for ongoing management of NSTEMI for further work-up including cardiac cath Dispo: The patient is from: Home              Anticipated d/c is to: Home              Anticipated d/c date is: 2 days              Patient currently is not medically stable to d/c. Nutrition: Diet Order            Diet NPO time specified Except for: Sips with Meds  Diet effective midnight                 Body mass index is 24.55 kg/m.  Consultants:see note  Procedures:see note Microbiology:see note Blood Culture    Component Value Date/Time   SDES URINE, RANDOM 07/29/2009 0135   SPECREQUEST IMMUNE:NORM UT SYMPT:NEG 07/29/2009 0135   CULT NO GROWTH 07/29/2009 0135   REPTSTATUS 07/30/2009 FINAL 07/29/2009 0135    Other culture-see note  Medications: Scheduled Meds: . amLODipine  10 mg Oral Daily  . aspirin  324 mg Oral NOW   Or  . aspirin  300 mg Rectal NOW  . aspirin EC  81 mg Oral Daily  . atorvastatin  40 mg Oral q1800  . brimonidine  1 drop Both Eyes BID  . ferrous sulfate  325 mg Oral Q breakfast  . folic acid  1 mg Oral Daily  . hydrALAZINE  25 mg Oral TID  . insulin aspart  0-15 Units Subcutaneous TID WC  . insulin glargine  30 Units Subcutaneous QAC breakfast  . latanoprost  1 drop Both Eyes QHS  . LORazepam  0-4 mg Intravenous Q4H   Followed by  . [START ON 08/23/2020] LORazepam  0-4 mg Intravenous Q8H  . metoprolol tartrate  25 mg Oral BID  . mometasone-formoterol  2 puff Inhalation BID  . multivitamin with minerals  1 tablet Oral Daily  . pantoprazole (PROTONIX) IV  40 mg Intravenous Q24H  . thiamine  100 mg Oral Daily   Or  . thiamine  100 mg Intravenous Daily  . traZODone  75 mg Oral QHS   Continuous  Infusions: . sodium chloride 100 mL/hr at 08/22/20 0710  . heparin 850 Units/hr (08/22/20 0419)  . nitroGLYCERIN 5 mcg/min (08/21/20 2251)    Antimicrobials: Anti-infectives (From admission, onward)   None     Objective: Vitals: Today's Vitals   08/22/20 0652 08/22/20 0700 08/22/20 0800 08/22/20 0845  BP: 140/90 138/90 129/84 (!) 158/94  Pulse: 67 64 73 66  Resp:  13 16 16   Temp:      TempSrc:      SpO2:  96% 97% 96%  Weight:      Height:      PainSc:       No intake or output data in the 24 hours  ending 08/22/20 0859 Filed Weights   08/21/20 1431  Weight: 79.8 kg   Weight change:   Intake/Output from previous day: No intake/output data recorded. Intake/Output this shift: No intake/output data recorded.  Examination: General exam: AAOx3,NAD, weak appearing. HEENT:Oral mucosa moist, Ear/Nose WNL grossly,dentition normal. Respiratory system: bilaterally clear,no wheezing or crackles,no use of accessory muscle, non tender. Cardiovascular system: S1 & S2 +, regular, No JVD. Gastrointestinal system: Abdomen soft, NT,ND, BS+. Nervous System:Alert, awake, moving extremities and grossly nonfocal Extremities: No edema, distal peripheral pulses palpable.  Skin: No rashes,no icterus. MSK: Normal muscle bulk,tone, power  Data Reviewed: I have personally reviewed following labs and imaging studies CBC: Recent Labs  Lab 08/21/20 1436 08/22/20 0535  WBC 7.3 7.1  HGB 13.6 14.2  HCT 38.0* 39.4  MCV 78.4* 77.9*  PLT 209 206   Basic Metabolic Panel: Recent Labs  Lab 08/21/20 1436 08/21/20 2347 08/22/20 0535  NA 130*  --  135  K 3.8  --  3.6  CL 99  --  100  CO2 20*  --  22  GLUCOSE 242*  --  160*  BUN 15  --  14  CREATININE 1.46*  --  1.50*  CALCIUM 9.9  --  9.9  MG  --  2.3  --   PHOS  --  3.3  --    GFR: Estimated Creatinine Clearance: 55.1 mL/min (A) (by C-G formula based on SCr of 1.5 mg/dL (H)). Liver Function Tests: Recent Labs  Lab 08/21/20 2347   AST 29  ALT 22  ALKPHOS 53  BILITOT 0.6  PROT 6.5  ALBUMIN 3.9   No results for input(s): LIPASE, AMYLASE in the last 168 hours. No results for input(s): AMMONIA in the last 168 hours. Coagulation Profile: No results for input(s): INR, PROTIME in the last 168 hours. Cardiac Enzymes: No results for input(s): CKTOTAL, CKMB, CKMBINDEX, TROPONINI in the last 168 hours. BNP (last 3 results) No results for input(s): PROBNP in the last 8760 hours. HbA1C: Recent Labs    08/21/20 2347  HGBA1C 7.6*   CBG: Recent Labs  Lab 08/21/20 1612 08/22/20 0046 08/22/20 0744  GLUCAP 171* 70 159*   Lipid Profile: No results for input(s): CHOL, HDL, LDLCALC, TRIG, CHOLHDL, LDLDIRECT in the last 72 hours. Thyroid Function Tests: No results for input(s): TSH, T4TOTAL, FREET4, T3FREE, THYROIDAB in the last 72 hours. Anemia Panel: No results for input(s): VITAMINB12, FOLATE, FERRITIN, TIBC, IRON, RETICCTPCT in the last 72 hours. Sepsis Labs: No results for input(s): PROCALCITON, LATICACIDVEN in the last 168 hours.  Recent Results (from the past 240 hour(s))  Respiratory Panel by RT PCR (Flu A&B, Covid) - Nasopharyngeal Swab     Status: None   Collection Time: 08/21/20  7:42 PM   Specimen: Nasopharyngeal Swab  Result Value Ref Range Status   SARS Coronavirus 2 by RT PCR NEGATIVE NEGATIVE Final    Comment: (NOTE) SARS-CoV-2 target nucleic acids are NOT DETECTED.  The SARS-CoV-2 RNA is generally detectable in upper respiratoy specimens during the acute phase of infection. The lowest concentration of SARS-CoV-2 viral copies this assay can detect is 131 copies/mL. A negative result does not preclude SARS-Cov-2 infection and should not be used as the sole basis for treatment or other patient management decisions. A negative result may occur with  improper specimen collection/handling, submission of specimen other than nasopharyngeal swab, presence of viral mutation(s) within the areas targeted  by this assay, and inadequate number of viral copies (<131 copies/mL). A negative  result must be combined with clinical observations, patient history, and epidemiological information. The expected result is Negative.  Fact Sheet for Patients:  https://www.moore.com/https://www.fda.gov/media/142436/download  Fact Sheet for Healthcare Providers:  https://www.young.biz/https://www.fda.gov/media/142435/download  This test is no t yet approved or cleared by the [place] FDA and  has been authorized for detection and/or diagnosis of SARS-CoV-2 by FDA under an Emergency Use Authorization (EUA). This EUA will remain  in effect (meaning this test can be used) for the duration of the COVID-19 declaration under Section 564(b)(1) of the Act, 21 U.S.C. section 360bbb-3(b)(1), unless the authorization is terminated or revoked sooner.     Influenza A by PCR NEGATIVE NEGATIVE Final   Influenza B by PCR NEGATIVE NEGATIVE Final    Comment: (NOTE) The Xpert Xpress SARS-CoV-2/FLU/RSV assay is intended as an aid in  the diagnosis of influenza from Nasopharyngeal swab specimens and  should not be used as a sole basis for treatment. Nasal washings and  aspirates are unacceptable for Xpert Xpress SARS-CoV-2/FLU/RSV  testing.  Fact Sheet for Patients: https://www.moore.com/https://www.fda.gov/media/142436/download  Fact Sheet for Healthcare Providers: https://www.young.biz/https://www.fda.gov/media/142435/download  This test is not yet approved or cleared by the [place] FDA and  has been authorized for detection and/or diagnosis of SARS-CoV-2 by  FDA under an Emergency Use Authorization (EUA). This EUA will remain  in effect (meaning this test can be used) for the duration of the  Covid-19 declaration under Section 564(b)(1) of the Act, 21  U.S.C. section 360bbb-3(b)(1), unless the authorization is  terminated or revoked. Performed at [facility], [address] [place], [place] [address]      Radiology Studies: DG Chest 2 View  Result Date:  08/21/2020 CLINICAL DATA:  LEFT chest pain radiating to anterior neck for 2 weeks, history coronary disease post stenting, hypertension, diabetes mellitus, COPD, kidney disease, alcohol and substance abuse EXAM: CHEST - 2 VIEW COMPARISON:  12/18/2019 FINDINGS: Normal heart size, mediastinal contours, and pulmonary vascularity. Minimal bibasilar scarring. Lungs otherwise clear. No acute infiltrate, pleural effusion or pneumothorax. Osseous structures unremarkable. IMPRESSION: No acute abnormalities. Electronically Signed   By: Ulyses SouthwardMark  Boles M.D.   On: 08/21/2020 15:15     LOS: 0 days   Lanae Boastamesh Tarina Volk, MD [facility]  08/22/2020, 8:59 AM

## 2020-08-22 NOTE — CV Procedure (Signed)
   Heavy left main and proximal LAD calcification.  Significant distal RCA calcification within the region of previously placed tandem stents.  50% left main (DFR 0.99)  50 to 70% proximal LAD (DFR 0.93)  Widely patent ramus  Widely patent circumflex  Very tortuous right coronary with distal high-grade stenosis between 2 previously placed stents.  Successful balloon angioplasty reducing 95% stenosis in the distal RCA to less than 50%.  High-pressure balloon inflation was not able to fully dilate the lesion and therefore stent was not placed.  If restenosis, may need to consider shockwave balloon angioplasty although deliverability will be a problem.  Same with atherectomy devices.  Aspirin and Plavix x6 months.

## 2020-08-22 NOTE — Progress Notes (Signed)
  Echocardiogram 2D Echocardiogram has been performed.  Jose [name] 08/22/2020, 10:17 AM

## 2020-08-22 NOTE — ED Notes (Signed)
Cardiology at bedside.

## 2020-08-22 NOTE — Progress Notes (Signed)
Patient received from cath lab at 1923 and right radial assessed. Noted hematoma proximal to site. Pressure held for 20 minutes. Site level 0. Site rechecked at 2000 and noted no change in assessment. Admitted patient with [facility] RN and she is aware of radial site. Report given to [facility].  Warm blankets applied per patient request and patient resting comfortably at 2000.

## 2020-08-22 NOTE — Progress Notes (Signed)
Inpatient Diabetes Program Recommendations  AACE/ADA: New Consensus Statement on Inpatient Glycemic Control (2015)  Target Ranges:  Prepandial:   less than 140 mg/dL      Peak postprandial:   less than 180 mg/dL (1-2 hours)      Critically ill patients:  140 - 180 mg/dL   Lab Results  Component Value Date   GLUCAP 66 (L) 08/22/2020   HGBA1C 7.6 (H) 08/21/2020    Review of Glycemic Control Results for Jose [name], Jose [name] (MRN [id number]) as of 08/22/2020 11:29  Ref. Range 08/22/2020 00:46 08/22/2020 07:44 08/22/2020 11:02  Glucose-Capillary Latest Ref Range: 70 - 99 mg/dL 70 448 (H) 66 (L)   Diabetes history: Type 2 DM Outpatient Diabetes medications: Lantus 30 units QAM, Novolog 0-9 units TID Current orders for Inpatient glycemic control: Novolog 0-15 units TID, Lantus 10 units QAM  Inpatient Diabetes Program Recommendations:    Noted mild hypoglycemia of 66 mg/dL following 3 units of Novolog. Patient is NPO and has CKD.  Consider reducing Novolog correction to 0-6 units TID.  Additionally, Lantus was held this AM.   Thanks, Lujean Rave, MSN, RNC-OB Diabetes Coordinator [phone] (8a-5p)

## 2020-08-22 NOTE — Progress Notes (Signed)
ANTICOAGULATION CONSULT NOTE  Pharmacy Consult for IV heparin Indication: chest pain/ACS  Allergies  Allergen Reactions  . Lisinopril-Hydrochlorothiazide Swelling    Causes swelling of lips  . Simvastatin Other (See Comments)    Reported by [facility] 04/10/16 - unknown reaction    Patient Measurements: Height: 5\' 11"  (180.3 cm) Weight: 79.8 kg (176 lb) IBW/kg (Calculated) : 75.3 Heparin Dosing Weight: 79.8kg  Vital Signs: BP: 138/84 (09/29 1300) Pulse Rate: 68 (09/29 1300)  Labs: Recent Labs    08/21/20 1436 08/21/20 1930 08/22/20 0258 08/22/20 0535 08/22/20 1215  HGB 13.6  --   --  14.2  --   HCT 38.0*  --   --  39.4  --   PLT 209  --   --  206  --   HEPARINUNFRC  --   --  0.75*  --  0.50  CREATININE 1.46*  --   --  1.50*  --   TROPONINIHS 1,499* 1,729*  --  1,209*  --     Estimated Creatinine Clearance: 55.1 mL/min (A) (by C-G formula based on SCr of 1.5 mg/dL (H)).  Assessment: 59yoM presenting with chest pain x2 weeks continues on IV heparin. Level is now therapeutic at 0.5. CBC is WNL and no bleeding noted. Planning cardiac cath.   Goal of Therapy:  Heparin level 0.3-0.7 units/ml Monitor platelets by anticoagulation protocol: Yes   Plan:  Continue heparin gtt 850 units/hr Daily heparin level and CBC F/u cath plans  05-02-2000, PharmD, BCPS Clinical Pharmacist Please see AMION for all pharmacy numbers 08/22/2020 1:46 PM

## 2020-08-22 NOTE — ED Notes (Signed)
Pt niece Sue Lush - requesting updates on pt POC [phone]; Pt gives verbal consent

## 2020-08-22 NOTE — H&P (View-Only) (Signed)
Progress Note  Patient Name: Jose [name] Date of Encounter: 08/22/2020  Primary Cardiologist:  Kristeen Miss, MD  Subjective   Feels better today, is hungry, wants food.   Inpatient Medications    Scheduled Meds: . amLODipine  10 mg Oral Daily  . aspirin  324 mg Oral NOW   Or  . aspirin  300 mg Rectal NOW  . aspirin EC  81 mg Oral Daily  . atorvastatin  40 mg Oral q1800  . brimonidine  1 drop Both Eyes BID  . ferrous sulfate  325 mg Oral Q breakfast  . folic acid  1 mg Oral Daily  . furosemide  40 mg Oral QODAY  . hydrALAZINE  25 mg Oral TID  . insulin aspart  0-15 Units Subcutaneous TID WC  . insulin glargine  30 Units Subcutaneous QAC breakfast  . latanoprost  1 drop Both Eyes QHS  . LORazepam  0-4 mg Intravenous Q4H   Followed by  . [START ON 08/23/2020] LORazepam  0-4 mg Intravenous Q8H  . metoprolol tartrate  25 mg Oral BID  . mometasone-formoterol  2 puff Inhalation BID  . multivitamin with minerals  1 tablet Oral Daily  . pantoprazole (PROTONIX) IV  40 mg Intravenous Q24H  . thiamine  100 mg Oral Daily   Or  . thiamine  100 mg Intravenous Daily  . traZODone  75 mg Oral QHS   Continuous Infusions: . sodium chloride 100 mL/hr at 08/22/20 0710  . heparin 850 Units/hr (08/22/20 0419)  . nitroGLYCERIN 5 mcg/min (08/21/20 2251)   PRN Meds: acetaminophen **OR** acetaminophen, albuterol, ALPRAZolam, LORazepam **OR** LORazepam, ondansetron (ZOFRAN) IV   Vital Signs    Vitals:   08/22/20 0630 08/22/20 0645 08/22/20 0652 08/22/20 0700  BP: 140/84 140/90 140/90 138/90  Pulse: 66 67 67 64  Resp: 15 17  13   Temp:      TempSrc:      SpO2: 96% 95%  96%  Weight:      Height:       No intake or output data in the 24 hours ending 08/22/20 0758 Filed Weights   08/21/20 1431  Weight: 79.8 kg   Last Weight  Most recent update: 08/21/2020  2:32 PM   Weight  79.8 kg (176 lb)           Weight change:    Telemetry    SR - Personally Reviewed  ECG     None today - Personally Reviewed  Physical Exam   General: Well developed, well nourished, male appearing in no acute distress. Head: Normocephalic, atraumatic.  Neck: Supple without bruits, JVD not elevated. Lungs:  Resp regular and unlabored, CTA. Heart: RRR, S1, S2, no S3, S4, or murmur; no rub. Abdomen: Soft, non-tender, non-distended with normoactive bowel sounds. No hepatomegaly. No rebound/guarding. No obvious abdominal masses. Extremities: No clubbing, cyanosis, no edema. Distal pedal pulses are 2+ bilaterally. Neuro: Alert and oriented X 3. Moves all extremities spontaneously. Psych: Normal affect.  Labs    Hematology Recent Labs  Lab 08/21/20 1436 08/22/20 0535  WBC 7.3 7.1  RBC 4.85 5.06  HGB 13.6 14.2  HCT 38.0* 39.4  MCV 78.4* 77.9*  MCH 28.0 28.1  MCHC 35.8 36.0  RDW 13.2 13.2  PLT 209 206    Chemistry Recent Labs  Lab 08/21/20 1436 08/21/20 2347 08/22/20 0535  NA 130*  --  135  K 3.8  --  3.6  CL 99  --  100  CO2 20*  --  22  GLUCOSE 242*  --  160*  BUN 15  --  14  CREATININE 1.46*  --  1.50*  CALCIUM 9.9  --  9.9  PROT  --  6.5  --   ALBUMIN  --  3.9  --   AST  --  29  --   ALT  --  22  --   ALKPHOS  --  53  --   BILITOT  --  0.6  --   GFRNONAA 51*  --  50*  GFRAA 59*  --  57*  ANIONGAP 11  --  13     High Sensitivity Troponin:   Recent Labs  Lab 08/21/20 1436 08/21/20 1930 08/22/20 0535  TROPONINIHS 1,499* 1,729* 1,209*      BNPNo results for input(s): BNP, PROBNP in the last 168 hours.   Lab Results  Component Value Date   CHOL 129 08/26/2012   HDL 37 (L) 08/26/2012   LDLCALC 72 08/26/2012   TRIG 98 08/26/2012   CHOLHDL 3.5 08/26/2012   Drugs of Abuse     Component Value Date/Time   LABOPIA NONE DETECTED 08/21/2020 1915   COCAINSCRNUR NONE DETECTED 08/21/2020 1915   LABBENZ NONE DETECTED 08/21/2020 1915   AMPHETMU NONE DETECTED 08/21/2020 1915   THCU POSITIVE (A) 08/21/2020 1915   LABBARB NONE DETECTED 08/21/2020  1915    Lab Results  Component Value Date   TSH 1.349 05/13/2019   Lab Results  Component Value Date   HGBA1C 7.6 (H) 08/21/2020    Radiology    DG Chest 2 View  Result Date: 08/21/2020 CLINICAL DATA:  LEFT chest pain radiating to anterior neck for 2 weeks, history coronary disease post stenting, hypertension, diabetes mellitus, COPD, kidney disease, alcohol and substance abuse EXAM: CHEST - 2 VIEW COMPARISON:  12/18/2019 FINDINGS: Normal heart size, mediastinal contours, and pulmonary vascularity. Minimal bibasilar scarring. Lungs otherwise clear. No acute infiltrate, pleural effusion or pneumothorax. Osseous structures unremarkable. IMPRESSION: No acute abnormalities. Electronically Signed   By: Ulyses Southward M.D.   On: 08/21/2020 15:15     Cardiac Studies   ECHO:  Ordered  CARDIAC CATH: Ordered  Patient Profile     59 y.o. 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   Admitted 09/28 with inf TWI on ECG, NSTEMI by ez, cath planned.  Assessment & Plan    1. NSTEMI - pain-free on ASA, Hep, Nitro, BB, statin - echo and cath today - continue current therapy. - ck lipid profile  2. Hx drug use - UDS + only for [facility] this admit - per IM  3. CKD III - hydrate for cath, Cr generally runs 1.4-1.7  - hold Lasix today, not on ACE/ARB  Otherwise, per IM Principal Problem:   Acute coronary syndrome (HCC) Active Problems:   Hyponatremia   Cocaine abuse (HCC)   Alcohol abuse   Uncontrolled type 2 diabetes mellitus with diabetic nephropathy, with long-term current use of insulin (HCC)   CKD (chronic kidney disease) stage 3, GFR 30-59 ml/min   COPD (chronic obstructive pulmonary disease) (HCC)   Elevated troponin   Dyslipidemia   Hypertensive  retinopathy of both eyes   Hypertensive emergency   Depression   Signed, Theodore Demark , PA-C 7:58  AM 08/22/2020 Pager: [phone]  Patient seen and examined.  Agree with above documentation.  On exam, patient is alert and oriented, regular rate and rhythm, no murmurs, lungs CTAB, no LE edema or JVD.  He is chest pain-free on nitroglycerin drip at 5 mcg/min.  Plan cardiac catheterization today.  Risks and benefits of cardiac catheterization have been discussed with the patient.  These include bleeding, infection, kidney damage, stroke, heart attack, death.  The patient understands these risks and is willing to proceed.  Little Ishikawa, MD

## 2020-08-22 NOTE — Interval H&P Note (Signed)
Cath Lab Visit (complete for each Cath Lab visit)  Clinical Evaluation Leading to the Procedure:   ACS: Yes.    Non-ACS:    Anginal Classification: No Symptoms  Anti-ischemic medical therapy: Minimal Therapy (1 class of medications)  Non-Invasive Test Results: No non-invasive testing performed  Prior CABG: No previous CABG      History and Physical Interval Note:  08/22/2020 3:10 PM  Jose [name]  has presented today for surgery, with the diagnosis of NSTEMI.  The various methods of treatment have been discussed with the patient and family. After consideration of risks, benefits and other options for treatment, the patient has consented to  Procedure(s): LEFT HEART CATH AND CORONARY ANGIOGRAPHY (N/A) as a surgical intervention.  The patient's history has been reviewed, patient examined, no change in status, stable for surgery.  I have reviewed the patient's chart and labs.  Questions were answered to the patient's satisfaction.     Lyn Records III

## 2020-08-22 NOTE — ED Notes (Signed)
Social work at bedside.  

## 2020-08-22 NOTE — ED Notes (Signed)
ECHO at bedside.

## 2020-08-23 ENCOUNTER — Encounter (HOSPITAL_COMMUNITY): Payer: Self-pay | Admitting: Interventional Cardiology

## 2020-08-23 DIAGNOSIS — I10 Essential (primary) hypertension: Secondary | ICD-10-CM

## 2020-08-23 LAB — CBC
HCT: 37.9 % — ABNORMAL LOW (ref 39.0–52.0)
Hemoglobin: 13.4 g/dL (ref 13.0–17.0)
MCH: 27.9 pg (ref 26.0–34.0)
MCHC: 35.4 g/dL (ref 30.0–36.0)
MCV: 78.8 fL — ABNORMAL LOW (ref 80.0–100.0)
Platelets: 194 10*3/uL (ref 150–400)
RBC: 4.81 MIL/uL (ref 4.22–5.81)
RDW: 13.2 % (ref 11.5–15.5)
WBC: 6.4 10*3/uL (ref 4.0–10.5)
nRBC: 0 % (ref 0.0–0.2)

## 2020-08-23 LAB — BASIC METABOLIC PANEL
Anion gap: 10 (ref 5–15)
BUN: 15 mg/dL (ref 8–23)
CO2: 19 mmol/L — ABNORMAL LOW (ref 22–32)
Calcium: 9.1 mg/dL (ref 8.9–10.3)
Chloride: 106 mmol/L (ref 98–111)
Creatinine, Ser: 1.53 mg/dL — ABNORMAL HIGH (ref 0.61–1.24)
GFR calc Af Amer: 56 mL/min — ABNORMAL LOW (ref >60)
GFR calc non Af Amer: 48 mL/min — ABNORMAL LOW (ref >60)
Glucose, Bld: 134 mg/dL — ABNORMAL HIGH (ref 70–99)
Potassium: 3.6 mmol/L (ref 3.5–5.1)
Sodium: 135 mmol/L (ref 135–145)

## 2020-08-23 LAB — GLUCOSE, CAPILLARY
Glucose-Capillary: 199 mg/dL — ABNORMAL HIGH (ref 70–99)
Glucose-Capillary: 172 mg/dL — ABNORMAL HIGH (ref 70–99)
Glucose-Capillary: 279 mg/dL — ABNORMAL HIGH (ref 70–99)
Glucose-Capillary: 162 mg/dL — ABNORMAL HIGH (ref 70–99)

## 2020-08-23 MED ORDER — PANTOPRAZOLE SODIUM 40 MG PO TBEC
40.0000 mg | DELAYED_RELEASE_TABLET | Freq: Every day | ORAL | Status: DC
  Administered 2020-08-24: 40 mg via ORAL
  Filled 2020-08-23: qty 1

## 2020-08-23 MED ORDER — LOPERAMIDE HCL 2 MG PO CAPS
2.0000 mg | ORAL_CAPSULE | Freq: Once | ORAL | Status: AC
  Administered 2020-08-23: 2 mg via ORAL
  Filled 2020-08-23: qty 1

## 2020-08-23 MED ORDER — INSULIN GLARGINE 100 UNIT/ML ~~LOC~~ SOLN
20.0000 [IU] | Freq: Every day | SUBCUTANEOUS | Status: DC
  Administered 2020-08-23 – 2020-08-24 (×2): 20 [IU] via SUBCUTANEOUS
  Filled 2020-08-23 (×3): qty 0.2

## 2020-08-23 MED ORDER — CARVEDILOL 6.25 MG PO TABS
6.2500 mg | ORAL_TABLET | Freq: Two times a day (BID) | ORAL | Status: DC
  Administered 2020-08-23 (×2): 6.25 mg via ORAL
  Filled 2020-08-23 (×2): qty 1

## 2020-08-23 MED ORDER — POLYETHYLENE GLYCOL 3350 17 G PO PACK
17.0000 g | PACK | Freq: Every day | ORAL | Status: DC | PRN
  Administered 2020-08-23: 17 g via ORAL
  Filled 2020-08-23: qty 1

## 2020-08-23 NOTE — Progress Notes (Signed)
CARDIAC REHAB PHASE I   PRE:  Rate/Rhythm: 72 SR  BP:  Sitting: 162/79      SaO2: 97 RA  MODE:  Ambulation: 200 ft   POST:  Rate/Rhythm: 98 ST with PVCs  BP:  Sitting: 169/92    SaO2: 99 RA  Pt helped to [facility] then ambulated 245ft in hallway assist of one with slow steady gait. Pt denies pain, or dizziness. Some visible SOB upon return to room, sats maintained on room air. Pt educated on importance of ASA, Plavix, statin, and NTG. Pt given MI book along with heart healthy and diabetic diets. Reviewed site care, restrictions, and exercise guidelines. Pt has concerns and anxieties over experiencing similar pain in the future. Stressed importance of seeking help, and not waiting at home for it to "get better. Will refer to [facility] [place].  [id number] Reynold Bowen, RN BSN 08/23/2020 8:46 AM

## 2020-08-23 NOTE — Progress Notes (Signed)
PROGRESS NOTE    Jose [name]  MWU:[id number] DOB: 01/20/1961 DOA: 08/21/2020 PCP: [name]   Chief Complaint  Patient presents with   Chest Pain   Brief Narrative:  59 y.o. male with medical history significant of alcohol abuse, CAD, stage III CKD, cocaine abuse, COPD, depression, type 2 diabetes, hyperlipidemia, hypertension, medication and treatment noncompliance who is coming to the emergency department with complaints of chest pain since about 2 weeks ago, but has gotten particularly worse in the past 2 days.  He describes the pain as a heaviness on his chest associated with dyspnea and nausea.  The pain radiates to his left neck, shoulder and arm.  It gets worse when he eats and when he lies down.  He denies palpitations, diaphoresis, PND or pitting edema of the lower extremities.  Has had epigastric pain that has not responded to Tums at home.  He denies emesis, diarrhea, constipation, melena or hematochezia.  No fever, chills, rhinorrhea, sore throat, wheezing or hemoptysis.  He denies dysuria, frequency hematuria.  No polyuria, polydipsia, polyphagia or blurred vision.  He states that his blood glucose has been under well controlled recently.  He admits to using cocaine on Sunday night and drinks beer daily.  Although he mentions he has not used cocaine in the previous 2 weeks and decrease his beer drinking from a sixpack every evening to only 2 beers a night.  ED course:Initial vital signs in the emergency department were temperature 98.5 F,pulse 72,respiration 18, BP 150/95 mmHg and O2 sat 100% on room air.The patient was started on heparin and nitroglycerin infusion.He was seen by cardiology.  His UDS was positive for THC.  CBC shows a white count 7.3, hemoglobin 13.6 g/dL and platelets 725.  LFTs were normal.  Sodium is 130 and CO2 20 mmol/L.  The rest of the electrolytes are within normal range.  Glucose 242, BUN 15 and creatinine 1.46 mg/dL.  EKG was NSR with T  wave inversion on inferior leads.  Troponin was 1499 on the first sample and about 3 hours later increased to 1729 ng/L.  His chest radiograph did not show any acute abnormality 9/29 Underwent cardiac cath, status post angioplasty unable to put a stent.  Subjective: Seen this morning.  Resting comfortably on heparin drip and nitroglycerin drip.  Denies any chest pain. He stated he has cut down alcohol drink to 2 beers a day from 6 beer and was using marijuana but has cut down his crack reports few days ago he did "1 line of crack'-felt heart palpitations/chest pain and told his friend that he is not going to do it again. Feeling hungry this morning.Seen by cardiology, is on heparin drip.  Assessment & Plan:  NSTEMI: Troponin peaked 1729, status post cardiac cath 9/29-PTCA to the RCA, stent was not placed due to high-pressure balloon could not completely dilate stenosis plan is to continue DAPT for 6 months, optimize medication.   Hyponatremia: Improved to 135. Initially referred by [facility] to ED for hyponatremia.  Likely from alcohol use.   Substance abuse/cocaine abuse/Alcohol abuse: UDS positive for TCH.  Strongly counseled on quitting he is aware that he may end up with further heart attack if continues to use cocaine.   Hypertension: BP borderline controlled, continue optimize meds per cardiology [name] Coreg 6.5 twice daily, continue amlodipine, hydralazine  Hypertensive emergency/hypertensive retinopathy of eyes: BP management as above.   Uncontrolled type 2 diabetes mellitus with diabetic nephropathy, with long-term current use of insulin.  Hemoglobin A1c borderline 7.6, resume home regimen of insulin sliding scale. Recent Labs  Lab 08/22/20 1102 08/22/20 1136 08/22/20 2135 08/23/20 0724 08/23/20 1247  GLUCAP 66* 136* 265* 199* 172*   CKD stage 3 A, creatinine stable holding 1.5 monitoring for 1 more day to ensure no contrast-induced nephropathy.  creatinine generally 1.4-1.7.   Recent Labs  Lab 08/21/20 1436 08/22/20 0535 08/23/20 0420  BUN CREATININE 1.46* 1.50* 1.53*   COPD: Breathing status stable, continue home inhalers/ICS, bronchodilators.  Dyslipidemia: Continue Lipitor, LDL 131 HDL 62  Depression: Continue bedtime trazodone.  DVT prophylaxis: heparin Code Status:   Code Status: Full Code  Family Communication: plan of care discussed with patient at bedside.  Status is: admitted as Observation, changed to inpatient due to NSTEMI 9/29 Dispo: The patient is from: Home              Anticipated d/c is to: Home              Anticipated d/c date is: 1 day if renal function remains stable              Patient currently is not medically stable to d/c. Nutrition: Diet Order            Diet Carb Modified Fluid consistency: Thin; Room service appropriate? Yes  Diet effective now                 Body mass index is 24.38 kg/m.  Consultants:see note  Procedures:  /29/21 cath    Tubular eccentric distal 50% left main stenosis. DFR across the left main into the ramus intermedius was 0.99  Heavy left main and proximal LAD calcification. Patent stent in the proximal LAD. Proximal to the stent there is 60 to 70% stenosis that was interrogated by DFR and was found to be hemodynamically insignificant at 0.93.  The ramus intermedius and circumflex were widely patent  The RCA is a very tortuous but large vessel that has tandem distal stents before the origin of the PDA. Between the 2 stents is a focal 95 to 99% stenosis.  Successful PTCA in the distal RCA reducing a 95% stenosis to 50%. High-pressure balloon angioplasty was not able to completely dilate the stenosis. Because of this, a stent was not placed. The procedure was complicated and required use of a guide catheter extender to cross the stenosis.  Normal left ventricular function. LVEDP 18 mmHg. EF 50%.  RECOMMENDATIONS:   It is hoped that the angioplasty on the focal  stenosis in the distal right coronary will be durable. If symptoms recur, may need to consider Shockwave balloon angioplasty or some other method fully dilate the lesion. Tortuosity and distal location makes atherectomy unattractive. Dual antiplatelet therapy with aspirin and Plavix for 6 months   Microbiology:see note Blood Culture    Component Value Date/Time   SDES URINE, RANDOM 07/29/2009 0135   SPECREQUEST IMMUNE:NORM UT SYMPT:NEG 07/29/2009 0135   CULT NO GROWTH 07/29/2009 0135   REPTSTATUS 07/30/2009 FINAL 07/29/2009 0135    Other culture-see note  Medications: Scheduled Meds:  amLODipine  10 mg Oral Daily   aspirin  81 mg Oral Daily   atorvastatin  40 mg Oral q1800   brimonidine  1 drop Both Eyes BID   carvedilol  6.25 mg Oral BID WC   clopidogrel  75 mg Oral Q breakfast   ferrous sulfate  325 mg Oral Q breakfast   folic acid  1 mg Oral Daily  heparin  5,000 Units Subcutaneous Q8H   hydrALAZINE  25 mg Oral TID   insulin aspart  0-15 Units Subcutaneous TID WC   insulin glargine  10 Units Subcutaneous QAC breakfast   latanoprost  1 drop Both Eyes QHS   LORazepam  0-4 mg Intravenous Q4H   Followed by   LORazepam  0-4 mg Intravenous Q8H   mometasone-formoterol  2 puff Inhalation BID   multivitamin with minerals  1 tablet Oral Daily   pantoprazole (PROTONIX) IV  40 mg Intravenous Q24H   sodium chloride flush  3 mL Intravenous Q12H   thiamine  100 mg Oral Daily   Or   thiamine  100 mg Intravenous Daily   traZODone  75 mg Oral QHS   Continuous Infusions:  sodium chloride     nitroGLYCERIN Stopped (08/23/20 1015)    Antimicrobials: Anti-infectives (From admission, onward)   None     Objective: Vitals: Today's Vitals   08/23/20 0508 08/23/20 0730 08/23/20 1013 08/23/20 1111  BP: 138/73 (!) 163/83 (!) 145/82   Pulse: 76 70  70  Resp: Temp: 98.6 F (37 C) 98.4 F (36.9 C)    TempSrc: Oral Oral    SpO2: 99% 98%  98%   Weight:      Height:      PainSc:        Intake/Output Summary (Last 24 hours) at 08/23/2020 1320 Last data filed at 08/23/2020 1015 Gross per 24 hour  Intake 360 ml  Output --  Net 360 ml   Filed Weights   08/21/20 1431 08/22/20 1933  Weight: 79.8 kg 79.3 kg   Weight change: -0.544 kg  Intake/Output from previous day: 09/29 0701 - 09/30 0700 In: 360 [P.O.:360] Out: 575 [Urine:575] Intake/Output this shift: No intake/output data recorded.  Examination: General exam: AAO , NAD, weak appearing. HEENT:Oral mucosa moist, Ear/Nose WNL grossly, dentition normal. Respiratory system: bilaterally clear,no wheezing or crackles,no use of accessory muscle Cardiovascular system: S1 & S2 +, No JVD,. Gastrointestinal system: Abdomen soft, NT,ND, BS+ Nervous System:Alert, awake, moving extremities and grossly nonfocal Extremities: No edema, distal peripheral pulses palpable.  Skin: No rashes,no icterus. MSK: Normal muscle bulk,tone, power  Data Reviewed: I have personally reviewed following labs and imaging studies CBC: Recent Labs  Lab 08/21/20 1436 08/22/20 0535 08/23/20 0420  WBC 7.3 7.1 6.4  HGB 13.6 14.2 13.4  HCT 38.0* 39.4 37.9*  MCV 78.4* 77.9* 78.8*  PLT 209 206 194   Basic Metabolic Panel: Recent Labs  Lab 08/21/20 1436 08/21/20 2347 08/22/20 0535 08/23/20 0420  NA 130*  --  135 135  K 3.8  --  3.6 3.6  CL 99  --  100 106  CO2 20*  --  22 19*  GLUCOSE 242*  --  160* 134*  BUN 15  --  14 15  CREATININE 1.46*  --  1.50* 1.53*  CALCIUM 9.9  --  9.9 9.1  MG  --  2.3  --   --   PHOS  --  3.3  --   --    GFR: Estimated Creatinine Clearance: 54 mL/min (A) (by C-G formula based on SCr of 1.53 mg/dL (H)). Liver Function Tests: Recent Labs  Lab 08/21/20 2347  AST 29  ALT 22  ALKPHOS 53  BILITOT 0.6  PROT 6.5  ALBUMIN 3.9   No results for input(s): LIPASE, AMYLASE in the last 168 hours. No results for input(s): AMMONIA in the last 168  hours. Coagulation  Profile: No results for input(s): INR, PROTIME in the last 168 hours. Cardiac Enzymes: No results for input(s): CKTOTAL, CKMB, CKMBINDEX, TROPONINI in the last 168 hours. BNP (last 3 results) No results for input(s): PROBNP in the last 8760 hours. HbA1C: Recent Labs    08/21/20 2347  HGBA1C 7.6*   CBG: Recent Labs  Lab 08/22/20 1102 08/22/20 1136 08/22/20 2135 08/23/20 0724 08/23/20 1247  GLUCAP 66* 136* 265* 199* 172*   Lipid Profile: Recent Labs    08/22/20 0535  CHOL 202*  HDL 62  LDLCALC 131*  TRIG 44  CHOLHDL 3.3   Thyroid Function Tests: No results for input(s): TSH, T4TOTAL, FREET4, T3FREE, THYROIDAB in the last 72 hours. Anemia Panel: No results for input(s): VITAMINB12, FOLATE, FERRITIN, TIBC, IRON, RETICCTPCT in the last 72 hours. Sepsis Labs: No results for input(s): PROCALCITON, LATICACIDVEN in the last 168 hours.  Recent Results (from the past 240 hour(s))  Respiratory Panel by RT PCR (Flu A&B, Covid) - Nasopharyngeal Swab     Status: None   Collection Time: 08/21/20  7:42 PM   Specimen: Nasopharyngeal Swab  Result Value Ref Range Status   SARS Coronavirus 2 by RT PCR NEGATIVE NEGATIVE Final    Comment: (NOTE) SARS-CoV-2 target nucleic acids are NOT DETECTED.  The SARS-CoV-2 RNA is generally detectable in upper respiratoy specimens during the acute phase of infection. The lowest concentration of SARS-CoV-2 viral copies this assay can detect is 131 copies/mL. A negative result does not preclude SARS-Cov-2 infection and should not be used as the sole basis for treatment or other patient management decisions. A negative result may occur with  improper specimen collection/handling, submission of specimen other than nasopharyngeal swab, presence of viral mutation(s) within the areas targeted by this assay, and inadequate number of viral copies (<131 copies/mL). A negative result must be combined with clinical observations, patient history, and  epidemiological information. The expected result is Negative.  Fact Sheet for Patients:  https://www.moore.com/  Fact Sheet for Healthcare Providers:  https://www.young.biz/  This test is no t yet approved or cleared by the [place] FDA and  has been authorized for detection and/or diagnosis of SARS-CoV-2 by FDA under an Emergency Use Authorization (EUA). This EUA will remain  in effect (meaning this test can be used) for the duration of the COVID-19 declaration under Section 564(b)(1) of the Act, 21 U.S.C. section 360bbb-3(b)(1), unless the authorization is terminated or revoked sooner.     Influenza A by PCR NEGATIVE NEGATIVE Final   Influenza B by PCR NEGATIVE NEGATIVE Final    Comment: (NOTE) The Xpert Xpress SARS-CoV-2/FLU/RSV assay is intended as an aid in  the diagnosis of influenza from Nasopharyngeal swab specimens and  should not be used as a sole basis for treatment. Nasal washings and  aspirates are unacceptable for Xpert Xpress SARS-CoV-2/FLU/RSV  testing.  Fact Sheet for Patients: https://www.moore.com/  Fact Sheet for Healthcare Providers: https://www.young.biz/  This test is not yet approved or cleared by the [place] FDA and  has been authorized for detection and/or diagnosis of SARS-CoV-2 by  FDA under an Emergency Use Authorization (EUA). This EUA will remain  in effect (meaning this test can be used) for the duration of the  Covid-19 declaration under Section 564(b)(1) of the Act, 21  U.S.C. section 360bbb-3(b)(1), unless the authorization is  terminated or revoked. Performed at [facility], [address], [place], [place] [address]      Radiology Studies: DG Chest 2  View  Result Date: 08/21/2020 CLINICAL DATA:  LEFT chest pain radiating to anterior neck for 2 weeks, history coronary disease post stenting, hypertension, diabetes mellitus, COPD, kidney  disease, alcohol and substance abuse EXAM: CHEST - 2 VIEW COMPARISON:  12/18/2019 FINDINGS: Normal heart size, mediastinal contours, and pulmonary vascularity. Minimal bibasilar scarring. Lungs otherwise clear. No acute infiltrate, pleural effusion or pneumothorax. Osseous structures unremarkable. IMPRESSION: No acute abnormalities. Electronically Signed   By: Ulyses SouthwardMark  Boles M.D.   On: 08/21/2020 15:15   CARDIAC CATHETERIZATION  Result Date: 08/22/2020  Tubular eccentric distal 50% left main stenosis.  DFR across the left main into the ramus intermedius was 0.99  Heavy left main and proximal LAD calcification.  Patent stent in the proximal LAD.  Proximal to the stent there is 60 to 70% stenosis that was interrogated by DFR and was found to be hemodynamically insignificant at 0.93.  The ramus intermedius and circumflex were widely patent  The RCA is a very tortuous but large vessel that has tandem distal stents before the origin of the PDA.  Between the 2 stents is a focal 95 to 99% stenosis.  Successful PTCA in the distal RCA reducing a 95% stenosis to 50%.  High-pressure balloon angioplasty was not able to completely dilate the stenosis.  Because of this, a stent was not placed.  The procedure was complicated and required use of a guide catheter extender to cross the stenosis.  Normal left ventricular function.  LVEDP 18 mmHg.  EF 50%. RECOMMENDATIONS:  It is hoped that the angioplasty on the focal stenosis in the distal right coronary will be durable.  If symptoms recur, may need to consider Shockwave balloon angioplasty or some other method fully dilate the lesion.  Tortuosity and distal location makes atherectomy unattractive.  Dual antiplatelet therapy with aspirin and Plavix for 6 months.  ECHOCARDIOGRAM COMPLETE  Result Date: 08/22/2020    ECHOCARDIOGRAM REPORT   Patient Name:   Jose [name] Date of Exam: 08/22/2020 Medical Rec #:  [id number]          Height:       71.0 in Accession #:     [id number]         Weight:       176.0 lb Date of Birth:  02/17/1961          BSA:          1.997 m Patient Age:    59 years           BP:           158/94 mmHg Patient Gender: M                  HR:           66 bpm. Exam Location:  Inpatient Procedure: 2D Echo Indications:    NSTEMI  History:        Patient has prior history of Echocardiogram examinations, most                 recent 04/27/2019. COPD and chronic kidney disease; Risk                 Factors:Hypertension, Dyslipidemia, Alcohol abuse. Cocaine use.                 and Diabetes.  Sonographer:    Delcie RochLauren Pennington Referring Phys: [id number] DAVID MANUEL ORTIZ IMPRESSIONS  1. Left ventricular ejection fraction, by estimation, is 50 to 55%. The left ventricle has  low normal function. The left ventricle has no regional wall motion abnormalities. There is moderate left ventricular hypertrophy. Left ventricular diastolic parameters are consistent with Grade I diastolic dysfunction (impaired relaxation).  2. Right ventricular systolic function is normal. The right ventricular size is normal.  3. The mitral valve is normal in structure. No evidence of mitral valve regurgitation. No evidence of mitral stenosis.  4. The aortic valve is normal in structure. Aortic valve regurgitation is not visualized. No aortic stenosis is present.  5. The inferior vena cava is normal in size with greater than 50% respiratory variability, suggesting right atrial pressure of 3 mmHg. FINDINGS  Left Ventricle: Left ventricular ejection fraction, by estimation, is 50 to 55%. The left ventricle has low normal function. The left ventricle has no regional wall motion abnormalities. The left ventricular internal cavity size was normal in size. There is moderate left ventricular hypertrophy. Left ventricular diastolic parameters are consistent with Grade I diastolic dysfunction (impaired relaxation). Right Ventricle: The right ventricular size is normal. No increase in right ventricular wall  thickness. Right ventricular systolic function is normal. Left Atrium: Left atrial size was normal in size. Right Atrium: Right atrial size was normal in size. Pericardium: There is no evidence of pericardial effusion. Mitral Valve: The mitral valve is normal in structure. No evidence of mitral valve regurgitation. No evidence of mitral valve stenosis. Tricuspid Valve: The tricuspid valve is normal in structure. Tricuspid valve regurgitation is not demonstrated. No evidence of tricuspid stenosis. Aortic Valve: The aortic valve is normal in structure. Aortic valve regurgitation is not visualized. No aortic stenosis is present. Pulmonic Valve: The pulmonic valve was normal in structure. Pulmonic valve regurgitation is trivial. No evidence of pulmonic stenosis. Aorta: The aortic root is normal in size and structure. Venous: The inferior vena cava is normal in size with greater than 50% respiratory variability, suggesting right atrial pressure of 3 mmHg. IAS/Shunts: No atrial level shunt detected by color flow Doppler.  LEFT VENTRICLE PLAX 2D LVIDd:         3.50 cm Diastology LVIDs:         2.50 cm LV e' medial:    4.57 cm/s LV PW:         1.60 cm LV E/e' medial:  11.2 LV IVS:        1.40 cm LV e' lateral:   5.87 cm/s                        LV E/e' lateral: 8.7  RIGHT VENTRICLE             IVC RV S prime:     14.90 cm/s  IVC diam: 1.40 cm TAPSE (M-mode): 2.5 cm LEFT ATRIUM             Index       RIGHT ATRIUM           Index LA diam:        3.60 cm 1.80 cm/m  RA Area:     13.10 cm LA Vol (A2C):   39.5 ml 19.78 ml/m RA Volume:   28.60 ml  14.32 ml/m LA Vol (A4C):   43.7 ml 21.88 ml/m LA Biplane Vol: 43.2 ml 21.63 ml/m  AORTIC VALVE LVOT Vmax:   63.30 cm/s LVOT Vmean:  39.700 cm/s LVOT VTI:    0.117 m  AORTA Ao Asc diam: 3.70 cm MITRAL VALVE MV Area (PHT): 2.62 cm    SHUNTS MV Decel  Time: 289 msec    Systemic VTI: 0.12 m MV E velocity: 51.00 cm/s MV A velocity: 86.10 cm/s MV E/A ratio:  0.59 Donato Schultz MD  Electronically signed by Donato Schultz MD Signature Date/Time: 08/22/2020/11:46:50 AM    Final      LOS: 1 day   Lanae Boast, MD [facility]  08/23/2020, 1:20 PM

## 2020-08-23 NOTE — Progress Notes (Addendum)
Progress Note  Patient Name: Jose [name] Date of Encounter: 08/23/2020  [facility] Cardiologist: Kristeen Miss, MD   Subjective   One brief sharp chest pain.  NTG is still on will wean after AM meds.    Inpatient Medications    Scheduled Meds: . amLODipine  10 mg Oral Daily  . aspirin  81 mg Oral Daily  . atorvastatin  40 mg Oral q1800  . brimonidine  1 drop Both Eyes BID  . carvedilol  6.25 mg Oral BID WC  . clopidogrel  75 mg Oral Q breakfast  . ferrous sulfate  325 mg Oral Q breakfast  . folic acid  1 mg Oral Daily  . heparin  5,000 Units Subcutaneous Q8H  . hydrALAZINE  25 mg Oral TID  . insulin aspart  0-15 Units Subcutaneous TID WC  . insulin glargine  10 Units Subcutaneous QAC breakfast  . latanoprost  1 drop Both Eyes QHS  . LORazepam  0-4 mg Intravenous Q4H   Followed by  . LORazepam  0-4 mg Intravenous Q8H  . mometasone-formoterol  2 puff Inhalation BID  . multivitamin with minerals  1 tablet Oral Daily  . pantoprazole (PROTONIX) IV  40 mg Intravenous Q24H  . sodium chloride flush  3 mL Intravenous Q12H  . thiamine  100 mg Oral Daily   Or  . thiamine  100 mg Intravenous Daily  . traZODone  75 mg Oral QHS   Continuous Infusions: . sodium chloride    . sodium chloride    . nitroGLYCERIN 15 mcg/min (08/22/20 1404)   PRN Meds: sodium chloride, acetaminophen **OR** acetaminophen, albuterol, ALPRAZolam, LORazepam **OR** LORazepam, ondansetron (ZOFRAN) IV, oxyCODONE, sodium chloride flush   Vital Signs    Vitals:   08/22/20 1933 08/22/20 1946 08/23/20 0508 08/23/20 0730  BP:  (!) 147/79 138/73 (!) 163/83  Pulse:  88 76 70  Resp:  19 18 18   Temp: 98.2 F (36.8 C)  98.6 F (37 C) 98.4 F (36.9 C)  TempSrc: Oral  Oral Oral  SpO2:  96% 99% 98%  Weight: 79.3 kg     Height: 5\' 11"  (1.803 m)       Intake/Output Summary (Last 24 hours) at 08/23/2020 0759 Last data filed at 08/22/2020 2000 Gross per 24 hour  Intake 360 ml  Output 575 ml  Net  -215 ml   Last 3 Weights 08/22/2020 08/21/2020 09/08/2019  Weight (lbs) 174 lb 12.8 oz 176 lb 185 lb 12.8 oz  Weight (kg) 79.289 kg 79.833 kg 84.278 kg      Telemetry   SR - Personally Reviewed  ECG    SR  To sinus arrhythmia and no acute ST changes similar to prior tracings. - Personally Reviewed  Physical Exam   GEN: No acute distress.   Neck: No JVD Cardiac: RRR, no murmurs, rubs, or gallops., Rt wrist cath site with bruising but no hematoma.   Respiratory: Clear to auscultation bilaterally. GI: Soft, nontender, non-distended  MS: No edema; No deformity. Neuro:  Nonfocal  Psych: Normal affect   Labs    High Sensitivity Troponin:   Recent Labs  Lab 08/21/20 1436 08/21/20 1930 08/22/20 0535  TROPONINIHS 1,499* 1,729* 1,209*      Chemistry Recent Labs  Lab 08/21/20 1436 08/21/20 2347 08/22/20 0535 08/23/20 0420  NA 130*  --  135 135  K 3.8  --  3.6 3.6  CL 99  --  100 106  CO2 20*  --  22  19*  GLUCOSE 242*  --  160* 134*  BUN 15  --  14 15  CREATININE 1.46*  --  1.50* 1.53*  CALCIUM 9.9  --  9.9 9.1  PROT  --  6.5  --   --   ALBUMIN  --  3.9  --   --   AST  --  29  --   --   ALT  --  22  --   --   ALKPHOS  --  53  --   --   BILITOT  --  0.6  --   --   GFRNONAA 51*  --  50* 48*  GFRAA 59*  --  57* 56*  ANIONGAP 11  --  13 10     Hematology Recent Labs  Lab 08/21/20 1436 08/22/20 0535 08/23/20 0420  WBC 7.3 7.1 6.4  RBC 4.85 5.06 4.81  HGB 13.6 14.2 13.4  HCT 38.0* 39.4 37.9*  MCV 78.4* 77.9* 78.8*  MCH 28.0 28.1 27.9  MCHC 35.8 36.0 35.4  RDW 13.2 13.2 13.2  PLT 209 206 194    BNPNo results for input(s): BNP, PROBNP in the last 168 hours.   DDimer No results for input(s): DDIMER in the last 168 hours.   Radiology    DG Chest 2 View  Result Date: 08/21/2020 CLINICAL DATA:  LEFT chest pain radiating to anterior neck for 2 weeks, history coronary disease post stenting, hypertension, diabetes mellitus, COPD, kidney disease, alcohol and  substance abuse EXAM: CHEST - 2 VIEW COMPARISON:  12/18/2019 FINDINGS: Normal heart size, mediastinal contours, and pulmonary vascularity. Minimal bibasilar scarring. Lungs otherwise clear. No acute infiltrate, pleural effusion or pneumothorax. Osseous structures unremarkable. IMPRESSION: No acute abnormalities. Electronically Signed   By: Ulyses Southward M.D.   On: 08/21/2020 15:15   CARDIAC CATHETERIZATION  Result Date: 08/22/2020  Tubular eccentric distal 50% left main stenosis.  DFR across the left main into the ramus intermedius was 0.99  Heavy left main and proximal LAD calcification.  Patent stent in the proximal LAD.  Proximal to the stent there is 60 to 70% stenosis that was interrogated by DFR and was found to be hemodynamically insignificant at 0.93.  The ramus intermedius and circumflex were widely patent  The RCA is a very tortuous but large vessel that has tandem distal stents before the origin of the PDA.  Between the 2 stents is a focal 95 to 99% stenosis.  Successful PTCA in the distal RCA reducing a 95% stenosis to 50%.  High-pressure balloon angioplasty was not able to completely dilate the stenosis.  Because of this, a stent was not placed.  The procedure was complicated and required use of a guide catheter extender to cross the stenosis.  Normal left ventricular function.  LVEDP 18 mmHg.  EF 50%. RECOMMENDATIONS:  It is hoped that the angioplasty on the focal stenosis in the distal right coronary will be durable.  If symptoms recur, may need to consider Shockwave balloon angioplasty or some other method fully dilate the lesion.  Tortuosity and distal location makes atherectomy unattractive.  Dual antiplatelet therapy with aspirin and Plavix for 6 months.  ECHOCARDIOGRAM COMPLETE  Result Date: 08/22/2020    ECHOCARDIOGRAM REPORT   Patient Name:   Jose [name] Schwieger Date of Exam: 08/22/2020 Medical Rec #:  [id number]          Height:       71.0 in Accession #:    [id number]  Weight:       176.0 lb Date of Birth:  1961/02/19          BSA:          1.997 m Patient Age:    59 years           BP:           158/94 mmHg Patient Gender: M                  HR:           66 bpm. Exam Location:  Inpatient Procedure: 2D Echo Indications:    NSTEMI  History:        Patient has prior history of Echocardiogram examinations, most                 recent 04/27/2019. COPD and chronic kidney disease; Risk                 Factors:Hypertension, Dyslipidemia, Alcohol abuse. Cocaine use.                 and Diabetes.  Sonographer:    Delcie Roch Referring Phys: [id number] DAVID MANUEL ORTIZ IMPRESSIONS  1. Left ventricular ejection fraction, by estimation, is 50 to 55%. The left ventricle has low normal function. The left ventricle has no regional wall motion abnormalities. There is moderate left ventricular hypertrophy. Left ventricular diastolic parameters are consistent with Grade I diastolic dysfunction (impaired relaxation).  2. Right ventricular systolic function is normal. The right ventricular size is normal.  3. The mitral valve is normal in structure. No evidence of mitral valve regurgitation. No evidence of mitral stenosis.  4. The aortic valve is normal in structure. Aortic valve regurgitation is not visualized. No aortic stenosis is present.  5. The inferior vena cava is normal in size with greater than 50% respiratory variability, suggesting right atrial pressure of 3 mmHg. FINDINGS  Left Ventricle: Left ventricular ejection fraction, by estimation, is 50 to 55%. The left ventricle has low normal function. The left ventricle has no regional wall motion abnormalities. The left ventricular internal cavity size was normal in size. There is moderate left ventricular hypertrophy. Left ventricular diastolic parameters are consistent with Grade I diastolic dysfunction (impaired relaxation). Right Ventricle: The right ventricular size is normal. No increase in right ventricular wall thickness. Right  ventricular systolic function is normal. Left Atrium: Left atrial size was normal in size. Right Atrium: Right atrial size was normal in size. Pericardium: There is no evidence of pericardial effusion. Mitral Valve: The mitral valve is normal in structure. No evidence of mitral valve regurgitation. No evidence of mitral valve stenosis. Tricuspid Valve: The tricuspid valve is normal in structure. Tricuspid valve regurgitation is not demonstrated. No evidence of tricuspid stenosis. Aortic Valve: The aortic valve is normal in structure. Aortic valve regurgitation is not visualized. No aortic stenosis is present. Pulmonic Valve: The pulmonic valve was normal in structure. Pulmonic valve regurgitation is trivial. No evidence of pulmonic stenosis. Aorta: The aortic root is normal in size and structure. Venous: The inferior vena cava is normal in size with greater than 50% respiratory variability, suggesting right atrial pressure of 3 mmHg. IAS/Shunts: No atrial level shunt detected by color flow Doppler.  LEFT VENTRICLE PLAX 2D LVIDd:         3.50 cm Diastology LVIDs:         2.50 cm LV e' medial:    4.57 cm/s LV PW:  1.60 cm LV E/e' medial:  11.2 LV IVS:        1.40 cm LV e' lateral:   5.87 cm/s                        LV E/e' lateral: 8.7  RIGHT VENTRICLE             IVC RV S prime:     14.90 cm/s  IVC diam: 1.40 cm TAPSE (M-mode): 2.5 cm LEFT ATRIUM             Index       RIGHT ATRIUM           Index LA diam:        3.60 cm 1.80 cm/m  RA Area:     13.10 cm LA Vol (A2C):   39.5 ml 19.78 ml/m RA Volume:   28.60 ml  14.32 ml/m LA Vol (A4C):   43.7 ml 21.88 ml/m LA Biplane Vol: 43.2 ml 21.63 ml/m  AORTIC VALVE LVOT Vmax:   63.30 cm/s LVOT Vmean:  39.700 cm/s LVOT VTI:    0.117 m  AORTA Ao Asc diam: 3.70 cm MITRAL VALVE MV Area (PHT): 2.62 cm    SHUNTS MV Decel Time: 289 msec    Systemic VTI: 0.12 m MV E velocity: 51.00 cm/s MV A velocity: 86.10 cm/s MV E/A ratio:  0.59 Donato Schultz MD Electronically signed by  Donato Schultz MD Signature Date/Time: 08/22/2020/11:46:50 AM    Final     Cardiac Studies   08/22/20 cath    Tubular eccentric distal 50% left main stenosis.  DFR across the left main into the ramus intermedius was 0.99  Heavy left main and proximal LAD calcification.  Patent stent in the proximal LAD.  Proximal to the stent there is 60 to 70% stenosis that was interrogated by DFR and was found to be hemodynamically insignificant at 0.93.  The ramus intermedius and circumflex were widely patent  The RCA is a very tortuous but large vessel that has tandem distal stents before the origin of the PDA.  Between the 2 stents is a focal 95 to 99% stenosis.  Successful PTCA in the distal RCA reducing a 95% stenosis to 50%.  High-pressure balloon angioplasty was not able to completely dilate the stenosis.  Because of this, a stent was not placed.  The procedure was complicated and required use of a guide catheter extender to cross the stenosis.  Normal left ventricular function.  LVEDP 18 mmHg.  EF 50%.  RECOMMENDATIONS:   It is hoped that the angioplasty on the focal stenosis in the distal right coronary will be durable.  If symptoms recur, may need to consider Shockwave balloon angioplasty or some other method fully dilate the lesion.  Tortuosity and distal location makes atherectomy unattractive.  Dual antiplatelet therapy with aspirin and Plavix for 6 months. Diagnostic Dominance: Right  Intervention    Echo 08/22/20  IMPRESSIONS    1. Left ventricular ejection fraction, by estimation, is 50 to 55%. The  left ventricle has low normal function. The left ventricle has no regional  wall motion abnormalities. There is moderate left ventricular hypertrophy.  Left ventricular diastolic  parameters are consistent with Grade I diastolic dysfunction (impaired  relaxation).  2. Right ventricular systolic function is normal. The right ventricular  size is normal.  3. The mitral valve is  normal in structure. No evidence of mitral valve  regurgitation. No evidence of mitral stenosis.  4. The aortic valve is normal in structure. Aortic valve regurgitation is  not visualized. No aortic stenosis is present.  5. The inferior vena cava is normal in size with greater than 50%  respiratory variability, suggesting right atrial pressure of 3 mmHg.   FINDINGS  Left Ventricle: Left ventricular ejection fraction, by estimation, is 50  to 55%. The left ventricle has low normal function. The left ventricle has  no regional wall motion abnormalities. The left ventricular internal  cavity size was normal in size.  There is moderate left ventricular hypertrophy. Left ventricular diastolic  parameters are consistent with Grade I diastolic dysfunction (impaired  relaxation).   Right Ventricle: The right ventricular size is normal. No increase in  right ventricular wall thickness. Right ventricular systolic function is  normal.   Left Atrium: Left atrial size was normal in size.   Right Atrium: Right atrial size was normal in size.   Pericardium: There is no evidence of pericardial effusion.   Mitral Valve: The mitral valve is normal in structure. No evidence of  mitral valve regurgitation. No evidence of mitral valve stenosis.   Tricuspid Valve: The tricuspid valve is normal in structure. Tricuspid  valve regurgitation is not demonstrated. No evidence of tricuspid  stenosis.   Aortic Valve: The aortic valve is normal in structure. Aortic valve  regurgitation is not visualized. No aortic stenosis is present.   Pulmonic Valve: The pulmonic valve was normal in structure. Pulmonic valve  regurgitation is trivial. No evidence of pulmonic stenosis.   Aorta: The aortic root is normal in size and structure.   Venous: The inferior vena cava is normal in size with greater than 50%  respiratory variability, suggesting right atrial pressure of 3 mmHg.   IAS/Shunts: No atrial level  shunt detected by color flow Doppler.     Patient Profile     59 y.o. 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  Admitted 09/28 with inf TWI on ECG, NSTEMI by ez, cath 08/22/20.  Assessment & Plan      1. NSTEMI pk hs troponin 1729 - pain-free on ASA, BB, statin, see PCI - continue current therapy.though will wean and stop IV NTG -cardiac rehab has walked pt without issues.   2. CAD with PTCA to dRCA stent was not placed due to high pressure balloon could not completley dilate stenosis.  Patent stent pLAD, non obstructive stenosis pLAD --DAPT for 6 months.    3. Hx drug use - UDS + only for [facility] this admit --ETOH use as well discussed importance of stopping - per IM  4. CKD III - hydrate for cath, Cr generally runs 1.4-1.7  Today 1.53  - hold Lasix today, not on ACE/ARB --will monitor today  5.  HLD on statin continue LDL 131, HDL 62  6.  HTN on amlodipine, coreg 6.25 BID and hydralazine 25 mg TID - BP elevated this AM but no meds yet.   7. DM per IM   For questions or updates, please contact [facility] Please consult [email or web address] for contact info under      Signed, Nada Boozer, NP  08/23/2020, 7:59 AM    Patient seen and examined.  Agree with above documentation.  On exam, patient is alert and oriented, regular rate and rhythm,  no murmurs, lungs CTAB, no LE edema or JVD.  Telemetry personally reviewed and shows sinus rhythm with rate 70s to 80s.  Doing well this morning, denies any chest pain or dyspnea.  Given complicated procedure yesterday with significant contrast load (190 cc) and considering baseline CKD, would favor monitoring another day to ensure no contrast-induced nephropathy on labs tomorrow morning.  In the interim, will  titrate antihypertensive regimen.  Started on carvedilol.  Discussed importance of abstinence from cocaine.  He states that he is only used cocaine once in the past year and is adamant that he will not use again.  Jose Ishikawahristopher L Jazon Jipson, MD

## 2020-08-24 ENCOUNTER — Telehealth: Payer: Self-pay | Admitting: Physician Assistant

## 2020-08-24 ENCOUNTER — Other Ambulatory Visit (HOSPITAL_COMMUNITY): Payer: Self-pay | Admitting: Internal Medicine

## 2020-08-24 DIAGNOSIS — E785 Hyperlipidemia, unspecified: Secondary | ICD-10-CM

## 2020-08-24 LAB — GLUCOSE, CAPILLARY
Glucose-Capillary: 230 mg/dL — ABNORMAL HIGH (ref 70–99)
Glucose-Capillary: 100 mg/dL — ABNORMAL HIGH (ref 70–99)

## 2020-08-24 LAB — BASIC METABOLIC PANEL
Anion gap: 11 (ref 5–15)
BUN: 19 mg/dL (ref 8–23)
CO2: 20 mmol/L — ABNORMAL LOW (ref 22–32)
Calcium: 9.2 mg/dL (ref 8.9–10.3)
Chloride: 102 mmol/L (ref 98–111)
Creatinine, Ser: 1.62 mg/dL — ABNORMAL HIGH (ref 0.61–1.24)
GFR calc Af Amer: 52 mL/min — ABNORMAL LOW (ref >60)
GFR calc non Af Amer: 45 mL/min — ABNORMAL LOW (ref >60)
Glucose, Bld: 263 mg/dL — ABNORMAL HIGH (ref 70–99)
Potassium: 3.4 mmol/L — ABNORMAL LOW (ref 3.5–5.1)
Sodium: 133 mmol/L — ABNORMAL LOW (ref 135–145)

## 2020-08-24 MED ORDER — AMLODIPINE BESYLATE 10 MG PO TABS: 10.0000 mg | ORAL_TABLET | Freq: Every day | ORAL | 0 refills | Status: DC

## 2020-08-24 MED ORDER — CARVEDILOL 6.25 MG PO TABS
6.2500 mg | ORAL_TABLET | Freq: Two times a day (BID) | ORAL | Status: DC
  Administered 2020-08-24: 6.25 mg via ORAL
  Filled 2020-08-24: qty 1

## 2020-08-24 MED ORDER — HYDRALAZINE HCL 50 MG PO TABS
50.0000 mg | ORAL_TABLET | Freq: Three times a day (TID) | ORAL | Status: DC
  Administered 2020-08-24: 50 mg via ORAL
  Filled 2020-08-24: qty 1

## 2020-08-24 MED ORDER — POTASSIUM CHLORIDE CRYS ER 20 MEQ PO TBCR
20.0000 meq | EXTENDED_RELEASE_TABLET | Freq: Once | ORAL | Status: AC
  Administered 2020-08-24: 20 meq via ORAL
  Filled 2020-08-24: qty 1

## 2020-08-24 MED ORDER — HYDRALAZINE HCL 50 MG PO TABS: 50.0000 mg | ORAL_TABLET | Freq: Three times a day (TID) | ORAL | 1 refills | Status: DC

## 2020-08-24 MED ORDER — HYDRALAZINE HCL 50 MG PO TABS: 50.0000 mg | ORAL_TABLET | Freq: Three times a day (TID) | ORAL | 0 refills | Status: DC

## 2020-08-24 MED ORDER — CLOPIDOGREL BISULFATE 75 MG PO TABS: 75.0000 mg | ORAL_TABLET | Freq: Every day | ORAL | 1 refills | Status: DC

## 2020-08-24 MED ORDER — CARVEDILOL 6.25 MG PO TABS: 6.2500 mg | ORAL_TABLET | Freq: Two times a day (BID) | ORAL | 0 refills | Status: DC

## 2020-08-24 MED ORDER — CLOPIDOGREL BISULFATE 75 MG PO TABS: 75.0000 mg | ORAL_TABLET | Freq: Every day | ORAL | 0 refills | Status: AC

## 2020-08-24 MED ORDER — CARVEDILOL 12.5 MG PO TABS: 12.5000 mg | ORAL_TABLET | Freq: Two times a day (BID) | ORAL | Status: DC

## 2020-08-24 MED ORDER — HYDRALAZINE HCL 50 MG PO TABS: 100.0000 mg | ORAL_TABLET | Freq: Three times a day (TID) | ORAL | Status: DC

## 2020-08-24 MED ORDER — CARVEDILOL 6.25 MG PO TABS: 6.2500 mg | ORAL_TABLET | Freq: Two times a day (BID) | ORAL | 1 refills | Status: DC

## 2020-08-24 MED ORDER — HYDRALAZINE HCL 50 MG PO TABS
50.0000 mg | ORAL_TABLET | ORAL | Status: AC
  Administered 2020-08-24: 50 mg via ORAL
  Filled 2020-08-24: qty 1

## 2020-08-24 MED FILL — CARVEDILOL 6.25 MG TABLET: 6.25 | 30 days supply | Qty: 60 | Fill #0

## 2020-08-24 MED FILL — AMLODIPINE BESYLATE 10 MG T: 10 | 30 days supply | Qty: 30 | Fill #0

## 2020-08-24 MED FILL — hydrALAZINE HCL 50 MG TABS: 50 | 30 days supply | Qty: 90 | Fill #0

## 2020-08-24 MED FILL — CLOPIDOGREL 75 MG TABLET: 75 | 30 days supply | Qty: 30 | Fill #0

## 2020-08-24 NOTE — Progress Notes (Signed)
Inpatient Diabetes Program Recommendations  AACE/ADA: New Consensus Statement on Inpatient Glycemic Control (2015)  Target Ranges:  Prepandial:   less than 140 mg/dL      Peak postprandial:   less than 180 mg/dL (1-2 hours)      Critically ill patients:  140 - 180 mg/dL   Lab Results  Component Value Date   GLUCAP 230 (H) 08/24/2020   HGBA1C 7.6 (H) 08/21/2020    Review of Glycemic Control Results for LAVAR, ROSENZWEIG (MRN [id number]) as of 08/24/2020 11:34  Ref. Range 08/23/2020 07:24 08/23/2020 12:47 08/23/2020 16:29 08/23/2020 21:02 08/24/2020 07:50  Glucose-Capillary Latest Ref Range: 70 - 99 mg/dL 700 (H) 174 (H) 944 (H) 162 (H) 230 (H)   Diabetes history: Type 2 DM Outpatient Diabetes medications: Lantus 30 units QAM, Novolog 0-9 units TID Current orders for Inpatient glycemic control:  Lantus 20 units Novolog 0-15 units tid  Inpatient Diabetes Program Recommendations:    Glucose trends elevated. Pt takes more basal insulin at home.  -  Consider Increasing Lantus to 22-24 units -  Reduce Novolog to 0-9 units tid due to renal function   Thanks,  Christena Deem RN, MSN, BC-ADM Inpatient Diabetes Coordinator Team Pager [phone] (8a-5p)

## 2020-08-24 NOTE — Progress Notes (Addendum)
Progress Note  Patient Name: Jose [name] Date of Encounter: 08/24/2020  [facility] Cardiologist: Kristeen Miss, MD   Subjective   No chest pain overnight, hoping to discharge today.  Inpatient Medications    Scheduled Meds: . amLODipine  10 mg Oral Daily  . aspirin  81 mg Oral Daily  . atorvastatin  40 mg Oral q1800  . brimonidine  1 drop Both Eyes BID  . carvedilol  6.25 mg Oral BID WC  . clopidogrel  75 mg Oral Q breakfast  . ferrous sulfate  325 mg Oral Q breakfast  . folic acid  1 mg Oral Daily  . heparin  5,000 Units Subcutaneous Q8H  . hydrALAZINE  50 mg Oral TID  . insulin aspart  0-15 Units Subcutaneous TID WC  . insulin glargine  20 Units Subcutaneous QAC breakfast  . latanoprost  1 drop Both Eyes QHS  . LORazepam  0-4 mg Intravenous Q8H  . mometasone-formoterol  2 puff Inhalation BID  . multivitamin with minerals  1 tablet Oral Daily  . pantoprazole  40 mg Oral Daily  . potassium chloride  20 mEq Oral Once  . sodium chloride flush  3 mL Intravenous Q12H  . thiamine  100 mg Oral Daily   Or  . thiamine  100 mg Intravenous Daily  . traZODone  75 mg Oral QHS   Continuous Infusions: . sodium chloride    . nitroGLYCERIN Stopped (08/23/20 1015)   PRN Meds: sodium chloride, acetaminophen **OR** acetaminophen, albuterol, ALPRAZolam, LORazepam **OR** LORazepam, ondansetron (ZOFRAN) IV, oxyCODONE, polyethylene glycol, sodium chloride flush   Vital Signs    Vitals:   08/23/20 2154 08/24/20 0025 08/24/20 0500 08/24/20 0751  BP: (!) 163/76 113/73 (!) 190/89 (!) 174/87  Pulse: 65 82 62 71  Resp:  18 18 18   Temp:  98.6 F (37 C) 98.2 F (36.8 C) 98.1 F (36.7 C)  TempSrc:  Oral Oral Oral  SpO2:  94% 96% 98%  Weight:   81.2 kg   Height:        Intake/Output Summary (Last 24 hours) at 08/24/2020 0802 Last data filed at 08/24/2020 0700 Gross per 24 hour  Intake 1015 ml  Output --  Net 1015 ml   Last 3 Weights 08/24/2020 08/22/2020 08/21/2020   Weight (lbs) 179 lb 0.2 oz 174 lb 12.8 oz 176 lb  Weight (kg) 81.2 kg 79.289 kg 79.833 kg      Telemetry    Sinus rhythm HR 70 - Personally Reviewed  ECG    No new tracings - Personally Reviewed  Physical Exam   GEN: No acute distress.   Neck: No JVD Cardiac: RRR, no murmurs, rubs, or gallops.  Respiratory: Clear to auscultation bilaterally. GI: Soft, nontender, non-distended  MS: No edema; No deformity. Neuro:  Nonfocal  Psych: Normal affect  Right radial access site C/D/I  Labs    High Sensitivity Troponin:   Recent Labs  Lab 08/21/20 1436 08/21/20 1930 08/22/20 0535  TROPONINIHS 1,499* 1,729* 1,209*      Chemistry Recent Labs  Lab 08/21/20 1436 08/21/20 2347 08/22/20 0535 08/23/20 0420 08/24/20 0449  NA   < >  --  135 135 133*  K   < >  --  3.6 3.6 3.4*  CL   < >  --  100 106 102  CO2   < >  --  22 19* 20*  GLUCOSE   < >  --  160* 134* 263*  BUN   < >  --  14 15 19   CREATININE   < >  --  1.50* 1.53* 1.62*  CALCIUM   < >  --  9.9 9.1 9.2  PROT  --  6.5  --   --   --   ALBUMIN  --  3.9  --   --   --   AST  --  29  --   --   --   ALT  --  22  --   --   --   ALKPHOS  --  53  --   --   --   BILITOT  --  0.6  --   --   --   GFRNONAA   < >  --  50* 48* 45*  GFRAA   < >  --  57* 56* 52*  ANIONGAP   < >  --  13 10 11    < > = values in this interval not displayed.     Hematology Recent Labs  Lab 08/21/20 1436 08/22/20 0535 08/23/20 0420  WBC 7.3 7.1 6.4  RBC 4.85 5.06 4.81  HGB 13.6 14.2 13.4  HCT 38.0* 39.4 37.9*  MCV 78.4* 77.9* 78.8*  MCH 28.0 28.1 27.9  MCHC 35.8 36.0 35.4  RDW 13.2 13.2 13.2  PLT 209 206 194    BNPNo results for input(s): BNP, PROBNP in the last 168 hours.   DDimer No results for input(s): DDIMER in the last 168 hours.   Radiology    CARDIAC CATHETERIZATION  Result Date: 08/22/2020  Tubular eccentric distal 50% left main stenosis.  DFR across the left main into the ramus intermedius was 0.99  Heavy left main  and proximal LAD calcification.  Patent stent in the proximal LAD.  Proximal to the stent there is 60 to 70% stenosis that was interrogated by DFR and was found to be hemodynamically insignificant at 0.93.  The ramus intermedius and circumflex were widely patent  The RCA is a very tortuous but large vessel that has tandem distal stents before the origin of the PDA.  Between the 2 stents is a focal 95 to 99% stenosis.  Successful PTCA in the distal RCA reducing a 95% stenosis to 50%.  High-pressure balloon angioplasty was not able to completely dilate the stenosis.  Because of this, a stent was not placed.  The procedure was complicated and required use of a guide catheter extender to cross the stenosis.  Normal left ventricular function.  LVEDP 18 mmHg.  EF 50%. RECOMMENDATIONS:  It is hoped that the angioplasty on the focal stenosis in the distal right coronary will be durable.  If symptoms recur, may need to consider Shockwave balloon angioplasty or some other method fully dilate the lesion.  Tortuosity and distal location makes atherectomy unattractive.  Dual antiplatelet therapy with aspirin and Plavix for 6 months.  ECHOCARDIOGRAM COMPLETE  Result Date: 08/22/2020    ECHOCARDIOGRAM REPORT   Patient Name:   Jose [name] Date of Exam: 08/22/2020 Medical Rec #:  Clarene Critchley          Height:       71.0 in Accession #:    08/24/2020         Weight:       176.0 lb Date of Birth:  1961/03/04          BSA:          1.997 m Patient Age:    59 years  BP:           158/94 mmHg Patient Gender: M                  HR:           66 bpm. Exam Location:  Inpatient Procedure: 2D Echo Indications:    NSTEMI  History:        Patient has prior history of Echocardiogram examinations, most                 recent 04/27/2019. COPD and chronic kidney disease; Risk                 Factors:Hypertension, Dyslipidemia, Alcohol abuse. Cocaine use.                 and Diabetes.  Sonographer:    Delcie RochLauren Pennington Referring  Phys: [id number] DAVID MANUEL ORTIZ IMPRESSIONS  1. Left ventricular ejection fraction, by estimation, is 50 to 55%. The left ventricle has low normal function. The left ventricle has no regional wall motion abnormalities. There is moderate left ventricular hypertrophy. Left ventricular diastolic parameters are consistent with Grade I diastolic dysfunction (impaired relaxation).  2. Right ventricular systolic function is normal. The right ventricular size is normal.  3. The mitral valve is normal in structure. No evidence of mitral valve regurgitation. No evidence of mitral stenosis.  4. The aortic valve is normal in structure. Aortic valve regurgitation is not visualized. No aortic stenosis is present.  5. The inferior vena cava is normal in size with greater than 50% respiratory variability, suggesting right atrial pressure of 3 mmHg. FINDINGS  Left Ventricle: Left ventricular ejection fraction, by estimation, is 50 to 55%. The left ventricle has low normal function. The left ventricle has no regional wall motion abnormalities. The left ventricular internal cavity size was normal in size. There is moderate left ventricular hypertrophy. Left ventricular diastolic parameters are consistent with Grade I diastolic dysfunction (impaired relaxation). Right Ventricle: The right ventricular size is normal. No increase in right ventricular wall thickness. Right ventricular systolic function is normal. Left Atrium: Left atrial size was normal in size. Right Atrium: Right atrial size was normal in size. Pericardium: There is no evidence of pericardial effusion. Mitral Valve: The mitral valve is normal in structure. No evidence of mitral valve regurgitation. No evidence of mitral valve stenosis. Tricuspid Valve: The tricuspid valve is normal in structure. Tricuspid valve regurgitation is not demonstrated. No evidence of tricuspid stenosis. Aortic Valve: The aortic valve is normal in structure. Aortic valve regurgitation is not  visualized. No aortic stenosis is present. Pulmonic Valve: The pulmonic valve was normal in structure. Pulmonic valve regurgitation is trivial. No evidence of pulmonic stenosis. Aorta: The aortic root is normal in size and structure. Venous: The inferior vena cava is normal in size with greater than 50% respiratory variability, suggesting right atrial pressure of 3 mmHg. IAS/Shunts: No atrial level shunt detected by color flow Doppler.  LEFT VENTRICLE PLAX 2D LVIDd:         3.50 cm Diastology LVIDs:         2.50 cm LV e' medial:    4.57 cm/s LV PW:         1.60 cm LV E/e' medial:  11.2 LV IVS:        1.40 cm LV e' lateral:   5.87 cm/s  LV E/e' lateral: 8.7  RIGHT VENTRICLE             IVC RV S prime:     14.90 cm/s  IVC diam: 1.40 cm TAPSE (M-mode): 2.5 cm LEFT ATRIUM             Index       RIGHT ATRIUM           Index LA diam:        3.60 cm 1.80 cm/m  RA Area:     13.10 cm LA Vol (A2C):   39.5 ml 19.78 ml/m RA Volume:   28.60 ml  14.32 ml/m LA Vol (A4C):   43.7 ml 21.88 ml/m LA Biplane Vol: 43.2 ml 21.63 ml/m  AORTIC VALVE LVOT Vmax:   63.30 cm/s LVOT Vmean:  39.700 cm/s LVOT VTI:    0.117 m  AORTA Ao Asc diam: 3.70 cm MITRAL VALVE MV Area (PHT): 2.62 cm    SHUNTS MV Decel Time: 289 msec    Systemic VTI: 0.12 m MV E velocity: 51.00 cm/s MV A velocity: 86.10 cm/s MV E/A ratio:  0.59 Donato Schultz MD Electronically signed by Donato Schultz MD Signature Date/Time: 08/22/2020/11:46:50 AM    Final     Cardiac Studies   Heart cath 08/22/20:  Tubular eccentric distal 50% left main stenosis.  DFR across the left main into the ramus intermedius was 0.99  Heavy left main and proximal LAD calcification.  Patent stent in the proximal LAD.  Proximal to the stent there is 60 to 70% stenosis that was interrogated by DFR and was found to be hemodynamically insignificant at 0.93.  The ramus intermedius and circumflex were widely patent  The RCA is a very tortuous but large vessel that has tandem  distal stents before the origin of the PDA.  Between the 2 stents is a focal 95 to 99% stenosis.  Successful PTCA in the distal RCA reducing a 95% stenosis to 50%.  High-pressure balloon angioplasty was not able to completely dilate the stenosis.  Because of this, a stent was not placed.  The procedure was complicated and required use of a guide catheter extender to cross the stenosis.  Normal left ventricular function.  LVEDP 18 mmHg.  EF 50%.  RECOMMENDATIONS:   It is hoped that the angioplasty on the focal stenosis in the distal right coronary will be durable.  If symptoms recur, may need to consider Shockwave balloon angioplasty or some other method fully dilate the lesion.  Tortuosity and distal location makes atherectomy unattractive.  Dual antiplatelet therapy with aspirin and Plavix for 6 months.   Echo 08/22/20: 1. Left ventricular ejection fraction, by estimation, is 50 to 55%. The  left ventricle has low normal function. The left ventricle has no regional  wall motion abnormalities. There is moderate left ventricular hypertrophy.  Left ventricular diastolic  parameters are consistent with Grade I diastolic dysfunction (impaired  relaxation).  2. Right ventricular systolic function is normal. The right ventricular  size is normal.  3. The mitral valve is normal in structure. No evidence of mitral valve  regurgitation. No evidence of mitral stenosis.  4. The aortic valve is normal in structure. Aortic valve regurgitation is  not visualized. No aortic stenosis is present.  5. The inferior vena cava is normal in size with greater than 50%  respiratory variability, suggesting right atrial pressure of 3 mmHg.    Patient Profile     59 y.o. 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  Admitted 09/28 with inf TWI on ECG, NSTEMI, cath 08/22/20.  Assessment & Plan    NSTEMI - hs troponin peaked at 1729 - heavy left main and proximal LAD calcification - patent LAD stent with 60-70% disease proximal to the stent - RCA with 95-99% stenosis between 2 previously placed stents - PTCA dRCA - no stent due to high-pressure balloon angiplasty not able to completely dilate the stenosis - if symptoms recur - may need to consider shockwave balloon angioplasty - atherectomy would be difficult due to tortuosity and distal location - he tolerated the procedure well - placed on ASA and plavix, BB, statin   Mild diastolic dysfunction Hx of hyponatremia - Na 133 - discussed fluid restriction of 2L daily instead of eating salt packets as he has been advised in the past, given his CAD - he was drinking at least 6 beers nightly and is working to reduce this - check labs at follow up visit   CKD stage III - sCr 1.62 (1.53) - was 1.50 on admission - likely represents a plateau - consider checking one more BMP prior to discharge - BMP next week   Hyperlipidemia with LDL goal < 70 08/22/2020: Cholesterol 202; HDL 62; LDL Cholesterol 131; Triglycerides 44; VLDL 9 - increase lipitor from 40 to 80 - given residual disease, really needs to control LDL - may need PCSK9i   Hypertension - continue hydralazine 50 mg TID, coreg 6.25 mg BID, 10 mg amlodipine - hypertensive emergency on admission - monitor BP at home    Alcohol abuse Cocaine abuse - is working on reducing alcohol intake - will work on cocaine abstinence    Disposition Patient gets medications from [place]. Please send to [facility] vs [facility] pharmacy - pt expressed interest in [facility] pharmacy today.   [facility] will sign off.   Medication Recommendations:  Amlodipine 10 mg daily, carvedilol 6.25 mg BID, hydralazine 50 mg TID, ASA 81 mg daily, atorvastatin 40 mg daily, plavix 75 mg daily Other  recommendations (labs, testing, etc):  BMET within 1 week Follow up as an outpatient:  Scheduled for 10/11      For questions or updates, please contact [facility] Please consult [email or web address] for contact info under       Signed, Marcelino Duster, PA  08/24/2020, 8:02 AM    Patient seen and examined.  Agree with above documentation.  On exam, patient is alert and oriented, regular rate and rhythm, no murmurs, lungs CTAB, no LE edema or JVD.  Remains free of chest pain or dyspnea.  OK for discharge today, medications as above.  Little Ishikawa, MD

## 2020-08-24 NOTE — TOC Initial Note (Signed)
Transition of Care [facility]) - Initial/Assessment Note    Patient Details  Name: Jose [name] MRN: [id number] Date of Birth: 1961/10/14  [facility]) CM/SW Contact:    Gala Lewandowsky, RN Phone Number: 08/24/2020, 2:26 PM  Clinical Narrative:  Risk for readmission assessment completed. Patient presented for Nstemi. Patient uses the [facility] [place] for primary care provider needs. Patient has called his CSW and an appointment has been scheduled for Monday as a walk-in for follow up. Rx's from the hospital will be escribed to the [facility]. Patient has transportation home via private vehicle. No further needs from Case Manager at this time.                Expected Discharge Plan: Home/Self Care Barriers to Discharge: No Barriers Identified   Patient Goals and CMS Choice Patient states their goals for this hospitalization and ongoing recovery are:: to return home   Choice offered to / list presented to : NA  Expected Discharge Plan and Services Expected Discharge Plan: Home/Self Care In-house Referral: NA Discharge Planning Services: CM Consult Post Acute Care Choice: NA Living arrangements for the past 2 months: Apartment Expected Discharge Date: 08/24/20               DME Arranged: N/A         HH Arranged: NA          Prior Living Arrangements/Services Living arrangements for the past 2 months: Apartment Lives with:: Self Patient language and need for interpreter reviewed:: Yes Do you feel safe going back to the place where you live?: Yes      Need for Family Participation in Patient Care: Yes (Comment) Care giver support system in place?: Yes (comment)   Criminal Activity/Legal Involvement Pertinent to Current Situation/Hospitalization: No - Comment as needed  Activities of Daily Living Home Assistive Devices/Equipment: None ADL Screening (condition at time of admission) Patient's cognitive ability adequate to safely complete daily  activities?: Yes Is the patient deaf or have difficulty hearing?: No Does the patient have difficulty seeing, even when wearing glasses/contacts?: No Does the patient have difficulty concentrating, remembering, or making decisions?: No Patient able to express need for assistance with ADLs?: Yes Does the patient have difficulty dressing or bathing?: No Independently performs ADLs?: Yes (appropriate for developmental age) Does the patient have difficulty walking or climbing stairs?: Yes Weakness of Legs: Both Weakness of Arms/Hands: None  Permission Sought/Granted Permission sought to share information with : Case Manager, Family Supports                Emotional Assessment Appearance:: Appears stated age Attitude/Demeanor/Rapport: Engaged Affect (typically observed): Appropriate Orientation: : Oriented to Self, Oriented to  Time, Oriented to Situation, Oriented to Place Alcohol / Substance Use: Not Applicable Psych Involvement: No (comment)  Admission diagnosis:  Acute coronary syndrome (HCC) [I24.9] ACS (acute coronary syndrome) (HCC) [I24.9] NSTEMI (non-ST elevated myocardial infarction) [facility]) [I21.4] Patient Active Problem List   Diagnosis Date Noted  . NSTEMI (non-ST elevated myocardial infarction) (HCC) 08/22/2020  . Acute coronary syndrome (HCC) 08/21/2020  . Hypertensive emergency 08/21/2020  . Depression   . Elevated troponin   . Nausea and vomiting   . Suspected COVID-19 virus infection   . Diarrhea 04/27/2019  . Hypertensive retinopathy of both eyes 02/01/2019  . Chest pain 08/30/2017  . Chest pain with high risk for cardiac etiology 08/28/2017  . CKD (chronic kidney disease) stage 3, GFR 30-59 ml/min 08/28/2017  . COPD (chronic  obstructive pulmonary disease) (HCC) 08/28/2017  . CAD (coronary artery disease) 04/10/2016  . Dyslipidemia 04/10/2016  . Benign prostatic hyperplasia 04/10/2016  . Diabetes (HCC) 04/10/2016  . Hyperglycemia 10/17/2015  .  Uncontrolled type 2 diabetes mellitus with diabetic nephropathy, with long-term current use of insulin (HCC) 10/17/2015  . Acute renal failure superimposed on stage 3 chronic kidney disease (HCC) 10/17/2015  . Hyperkalemia 10/17/2015  . Dyslipidemia associated with type 2 diabetes mellitus (HCC) 10/17/2015  . Alcohol abuse   . Cocaine abuse (HCC) 06/24/2015  . Hyponatremia 05/12/2015  . Hypertension    PCP:  [facility], [name] Pharmacy:   [facility] [place], [place] - [address] [phone] [facility] [place] [address] [address] Phone: [phone] Fax: [phone]  [facility] [place], [place] - [address] [place] [address] Phone: [phone] Fax: [phone]   Readmission Risk Interventions Readmission Risk Prevention Plan 08/24/2020  Transportation Screening Complete  PCP or Specialist Appt within 3-5 Days Complete  HRI or Home Care Consult Complete  Social Work Consult for Recovery Care Planning/Counseling Complete  Palliative Care Screening Not Applicable  Medication Review Oceanographer) Complete  Some recent data might be hidden

## 2020-08-24 NOTE — Progress Notes (Signed)
BP elevated 195/94 with pulse 84.  Pt states each time he goes to see a doctor, his BP goes up; however, his BP runs fine at home. Administered scheduled BP meds. Will continue to monitor.  Jose [name]

## 2020-08-24 NOTE — Discharge Instructions (Signed)
Acute Coronary Syndrome Acute coronary syndrome (ACS) is a serious problem in which there is suddenly not enough blood and oxygen reaching the heart. ACS can result in chest pain or a heart attack. This condition is a medical emergency. If you have any symptoms of this condition, get help right away. What are the causes? This condition may be caused by:  A buildup of fat and cholesterol inside the arteries (atherosclerosis). This is the most common cause. The buildup (plaque) can cause blood vessels in the heart (coronary arteries) to become narrow or blocked, which reduces blood flow to the heart. Plaque can also break off and lead to a clot, which can block an artery and cause a heart attack or stroke.  Sudden tightening of the muscles around the coronary arteries (coronary spasm).  Tearing of a coronary artery (spontaneous coronary artery dissection).  Very low blood pressure (hypotension).  An abnormal heartbeat (arrhythmia).  Other medical conditions that cause a decrease of oxygen to the heart, such as anemiaorrespiratory failure.  Using cocaine or methamphetamine. What increases the risk? The following factors may make you more likely to develop this condition:  Age. The risk for ACS increases as you get older.  History of chest pain, heart attack, peripheral artery disease, or stroke.  Having taken chemotherapy or immune-suppressing medicines.  Being male.  Family history of chest pain, heart disease, or stroke.  Smoking.  Not exercising enough.  Being overweight.  High cholesterol.  High blood pressure (hypertension).  Diabetes.  Excessive alcohol use. What are the signs or symptoms? Common symptoms of this condition include:  Chest pain. The pain may last a long time, or it may stop and come back (recur). It may feel like: ? Crushing or squeezing. ? Tightness, pressure, fullness, or heaviness.  Arm, neck, jaw, or back pain.  Heartburn or  indigestion.  Shortness of breath.  Nausea.  Sudden cold sweats.  Light-headedness.  Dizziness or passing out.  Tiredness (fatigue). Sometimes there are no symptoms. How is this diagnosed? This condition may be diagnosed based on:  Your medical history and symptoms.  Imaging tests, such as: ? An electrocardiogram (ECG). This measures the heart's electrical activity. ? X-rays. ? CT scan. ? A coronary angiogram. For this test, dye is injected into the heart arteries and then X-rays are taken. ? Myocardial perfusion imaging. This test shows how well blood flows through your heart muscle.  Blood tests. These may be repeated at certain time intervals.  Exercise stress testing.  Echocardiogram. This is a test that uses sound waves to produce detailed images of the heart. How is this treated? Treatment for this condition may include:  Oxygen therapy.  Medicines, such as: ? Antiplatelet medicines and blood-thinning medicines, such as aspirin. These help prevent blood clots. ? Medicine that dissolves any blood clots (fibrinolytic therapy). ? Blood pressure medicines. ? Nitroglycerin. This helps widen blood vessels to improve blood flow. ? Pain medicine. ? Cholesterol-lowering medicine.  Surgery, such as: ? Coronary angioplasty with stent placement. This involves placing a small piece of metal that looks like mesh or a spring into a narrow coronary artery. This widens the artery and keeps it open. ? Coronary artery bypass surgery. This involves taking a section of a blood vessel from a different part of your body and placing it on the blocked coronary artery to allow blood to flow around the blockage.  Cardiac rehabilitation. This is a program that includes exercise training, education, and counseling to help you recover.   Follow these instructions at home: Eating and drinking  Eat a heart-healthy diet that includes whole grains, fruits and vegetables, lean proteins, and  low-fat or nonfat dairy products.  Limit how much salt (sodium) you eat as told by your health care provider. Follow instructions from your health care provider about any other eating or drinking restrictions, such as limiting foods that are high in fat and processed sugars.  Use healthy cooking methods such as roasting, grilling, broiling, baking, poaching, steaming, or stir-frying.  Work with a dietitian to follow a heart-healthy eating plan. Medicines  Take over-the-counter and prescription medicines only as told by your health care provider.  Do not take these medicines unless your health care provider approves: ? Vitamin supplements that contain vitamin A or vitamin E. ? NSAIDs, such as ibuprofen, naproxen, or celecoxib. ? Hormone replacement therapy that contains estrogen.  If you are taking blood thinners: ? Talk with your health care provider before you take any medicines that contain aspirin or NSAIDs. These medicines increase your risk for dangerous bleeding. ? Take your medicine exactly as told, at the same time every day. ? Avoid activities that could cause injury or bruising, and follow instructions about how to prevent falls. ? Wear a medical alert bracelet, and carry a card that lists what medicines you take. Activity  Follow your cardiac rehabilitation program. Do exercises as told by your physical therapist.  Ask your health care provider what activities and exercises are safe for you. Follow his or her instructions about lifting, driving, or climbing stairs. Lifestyle  Do not use any products that contain nicotine or tobacco, such as cigarettes, e-cigarettes, and chewing tobacco. If you need help quitting, ask your health care provider.  Do not drink alcohol if your health care provider tells you not to drink.  If you drink alcohol: ? Limit how much you have to 0-1 drink a day. ? Be aware of how much alcohol is in your drink. In the [place] one drink equals one 12 oz  bottle of beer (355 mL), one 5 oz glass of wine (148 mL), or one 1 oz glass of hard liquor (44 mL).  Maintain a healthy weight. If you need to lose weight, work with your health care provider to do so safely. General instructions  Tell all the health care providers who provide care for you about your heart condition, including your dentist. This may affect the medicines or treatment you receive.  Manage any other health conditions you have, such as hypertension or diabetes. These conditions affect your heart.  Pay attention to your mental health. You may be at higher risk for depression. ? Find ways to manage stress. ? Talk to your health care provider about depression screening and treatment.  Keep your vaccinations up to date. ? Get the flu shot (influenza vaccine) every year. ? Get the pneumococcal vaccine if you are age 65 or older.  If directed, monitor your blood pressure at home.  Keep all follow-up visits as told by your health care provider. This is important. Contact a health care provider if you:  Feel overwhelmed or sad.  Have trouble doing your daily activities. Get help right away if you:  Have pain in your chest, neck, arm, jaw, stomach, or back that recurs, and: ? It lasts for more than a few minutes. ? It is not relieved by taking the medicineyour health care provider prescribed.  Have unexplained: ? Heavy sweating. ? Heartburn or indigestion. ? Nausea or vomiting. ?   Shortness of breath. ? Difficulty breathing. ? Fatigue. ? Nervousness or anxiety. ? Weakness. ? Diarrhea. ? Dark stools or blood in your stool.  Have sudden light-headedness or dizziness.  Have blood pressure that is higher than 180/120.  Faint.  Have thoughts about hurting yourself. These symptoms may represent a serious problem that is an emergency. Do not wait to see if the symptoms will go away. Get medical help right away. Call your local emergency services (911 in the [place] Do  not drive yourself to the hospital.  Summary  Acute coronary syndrome (ACS) is when there is not enough blood and oxygen being supplied to the heart. ACS can result in chest pain or a heart attack.  Acute coronary syndrome is a medical emergency. If you have any symptoms of this condition, get help right away.  Treatment includes medicines and procedures to open the blocked arteries and restore blood flow. This information is not intended to replace advice given to you by your health care provider. Make sure you discuss any questions you have with your health care provider. Document Revised: 04/13/2019 Document Reviewed: 11/22/2018 [employer] Patient Education  2020 [employer]  

## 2020-08-24 NOTE — Progress Notes (Signed)
CARDIAC REHAB PHASE I   PRE:  Rate/Rhythm: 65 SR  BP:  Sitting: 184/83      MODE:  Ambulation: 400 ft   POST:  Rate/Rhythm: 84 SR  BP:  Sitting: 186/90   Pt ambulated 422ft in hallway independently with steady gait. Pt states he feels better today. Denies pain, SOB, or dizziness. Reinforced importance of medication compliance. Encouraged ambulation. Reviewed restrictions. Pt denies further questions or concerns at this time. Referred to [facility] [place].  [id number] Reynold Bowen, RN BSN 08/24/2020 9:28 AM

## 2020-08-24 NOTE — Telephone Encounter (Signed)
Pt is scheduled for a [facility] hospital f/u per Micah Flesher on 09/03/20 at 8:45 AM.

## 2020-08-24 NOTE — Discharge Summary (Signed)
Physician Discharge Summary  Jose [name] KDP:[id number] DOB: 08-19-1961 DOA: 08/21/2020  PCP: [name]  Admit date: 08/21/2020 Discharge date: 08/24/2020  Admitted From: home Disposition: HOME  Recommendations for Outpatient Follow-up:  1. Follow up with PCP in 1-2 weeks 2. Please obtain BMP/CBC in one week 3. Please follow up on the following pending results:  Home Health:NO  Equipment/Devices: NONE  Discharge Condition: Stable Code Status:   Code Status: Full Code Diet recommendation:  Diet Order            Diet - low sodium heart healthy           Diet Carb Modified Fluid consistency: Thin; Room service appropriate? Yes  Diet effective now               Brief/Interim Summary:  59 y.o.malewith medical history significant ofalcohol abuse, CAD, stage III CKD, cocaine abuse, COPD, depression, type 2 diabetes, hyperlipidemia, hypertension, medication and treatment noncompliance who is coming to the emergency department with complaints of chest pain since about 2 weeks ago, but has gotten particularly worse in the past 2 days. He describes the pain as a heaviness on his chest associated with dyspnea and nausea. The pain radiates to his left neck, shoulder and arm. It gets worse when he eats and when he lies down. He denies palpitations, diaphoresis, PND or pitting edema of the lower extremities. Has had epigastric pain that has not responded to Tums at home. He denies emesis, diarrhea, constipation, melena or hematochezia. No fever, chills, rhinorrhea, sore throat, wheezing or hemoptysis. He denies dysuria, frequency hematuria. No polyuria, polydipsia, polyphagia or blurred vision. He states that his blood glucose has been under well controlled recently. He admits to using cocaine on Sunday night and drinks beer daily. Although he mentions he has not used cocaine in the previous 2 weeks and decrease his beer drinking from a sixpack every evening to only 2  beers a night.  ED course:Initial vital signs in the emergency department were temperature98.5 F,pulse 72,respiration 18, BP 150/95 mmHg and O2 sat 100% on room air.The patient was started on heparin and nitroglycerin infusion.He was seen by cardiology.  His UDS was positive for THC. CBC shows a white count 7.3, hemoglobin 13.6 g/dL and platelets 834. LFTs were normal. Sodium is 130 and CO2 20 mmol/L. The rest of the electrolytes are within normal range. Glucose 242, BUN 15 and creatinine 1.46 mg/dL. EKG was NSR with T wave inversion on inferior leads. Troponin was 1499 on the first sample and about 3 hours later increased to 1729 ng/L.His chest radiograph did not show any acute abnormality 9/29 Underwent cardiac cath, status post angioplasty unable to put a stent. Patient was monitored additional night creatinine overall stable blood pressure slightly on higher side and med increased. Remains chest pain-free,seen by cardiology.  This time is medically stable for discharge home.  Discharge Diagnoses:   NSTEMI: Troponin peaked 1729, status post cardiac cath 9/29-PTCA to the RCA, stent was not placed due to high-pressure balloon could not completely dilate stenosis plan is to continue DAPT for 6 months, continue statin, beta-blocker.  Seen by cardiology this morning he has been ambulating without any chest pain.  He has been cleared for discharge home today.   Hyponatremia: Improved. Initially referred by [facility] to ED for hyponatremia.  Likely from alcohol use.   Substance abuse/cocaine abuse/Alcohol abuse: UDS positive for TCH.  Strongly counseled on quitting he is aware that he may end up with further  heart attack if continues to use cocaine.   Hypertension:  BP on higher side is hydralazine amlodipine dose has been changed by cardiology and also added on low dose coreg. He will monitor blood pressure at home and notify his MD if running high > 160-170s (instruction provided) he will  follow up with PCP to adjust medication.   Hypertensive emergency/hypertensive retinopathy of eyes: BP management as above.   Uncontrolled type 2 diabetes mellitus with diabetic nephropathy, with long-term current use of insulin.  Hemoglobin A1c borderline 7.6, resume home insulin on discharge.  CKD stage 3 A, creatinine stable holding stable baseline 1.5-1.7.    COPD:stable,continue home inhalers/ICS, bronchodilators.  Dyslipidemia: cont Lipitor, LDL 131 HDL 62  Depression: Continue bedtime trazodone.  Consults:  cardiology  Subjective: Alert awake, no chest pain.  Hoping to go home today.  Discharge Exam: Vitals:   08/24/20 1216 08/24/20 1417  BP: 105/65 (!) 150/81  Pulse:  66  Resp:    Temp:    SpO2:     General: Pt is alert, awake, not in acute distress Cardiovascular: RRR, S1/S2 +, no rubs, no gallops Respiratory: CTA bilaterally, no wheezing, no rhonchi Abdominal: Soft, NT, ND, bowel sounds + Extremities: no edema, no cyanosis  Discharge Instructions  Discharge Instructions    Amb Referral to Cardiac Rehabilitation   Complete by: As directed    Diagnosis:  NSTEMI PTCA     After initial evaluation and assessments completed: Virtual Based Care may be provided alone or in conjunction with Phase 2 Cardiac Rehab based on patient barriers.: Yes   Diet - low sodium heart healthy   Complete by: As directed    Discharge instructions   Complete by: As directed    Monitor BP at home if BP running high  > 170 call your MD.  Please call call MD or return to ER for similar or worsening recurring problem that brought you to hospital or if any fever,nausea/vomiting,abdominal pain, uncontrolled pain, chest pain,  shortness of breath or any other alarming symptoms.  Please follow-up your doctor as instructed in a week time and call the office for appointment.  Please avoid alcohol, smoking, or any other illicit substance and maintain healthy habits including taking your  regular medications as prescribed.  You were cared for by a hospitalist during your hospital stay. If you have any questions about your discharge medications or the care you received while you were in the hospital after you are discharged, you can call the unit and ask to speak with the hospitalist on call if the hospitalist that took care of you is not available.  Once you are discharged, your primary care physician will handle any further medical issues. Please note that NO REFILLS for any discharge medications will be authorized once you are discharged, as it is imperative that you return to your primary care physician (or establish a relationship with a primary care physician if you do not have one) for your aftercare needs so that they can reassess your need for medications and monitor your lab values   Increase activity slowly   Complete by: As directed      Allergies as of 08/24/2020      Reactions   Lisinopril-hydrochlorothiazide Swelling   Causes swelling of lips   Simvastatin Other (See Comments)   Reported by [facility] 04/10/16 - unknown reaction      Medication List    STOP taking these medications   predniSONE 10 MG (21)  Tbpk tablet Commonly known as: STERAPRED UNI-PAK 21 TAB     TAKE these medications   albuterol 108 (90 Base) MCG/ACT inhaler Commonly known as: VENTOLIN HFA Inhale 2 puffs into the lungs every 6 (six) hours as needed for wheezing or shortness of breath.   amLODipine 10 MG tablet Commonly known as: NORVASC Take 1 tablet (10 mg total) by mouth daily. Start taking on: August 25, 2020 What changed:   medication strength  how much to take   aspirin EC 81 MG tablet Take 81 mg by mouth daily. Swallow whole.   atorvastatin 40 MG tablet Commonly known as: LIPITOR Take 1 tablet (40 mg total) by mouth daily at 6 PM.   brimonidine 0.2 % ophthalmic solution Commonly known as: ALPHAGAN Place 1 drop into both eyes 2 (two) times daily.    budesonide-formoterol 160-4.5 MCG/ACT inhaler Commonly known as: SYMBICORT Inhale 2 puffs into the lungs 2 (two) times daily.   carvedilol 6.25 MG tablet Commonly known as: COREG Take 1 tablet (6.25 mg total) by mouth 2 (two) times daily with a meal.   clopidogrel 75 MG tablet Commonly known as: PLAVIX Take 1 tablet (75 mg total) by mouth daily with breakfast. Start taking on: August 25, 2020   ferrous sulfate 325 (65 FE) MG tablet Take 325 mg by mouth daily with breakfast.   folic acid 1 MG tablet Commonly known as: FOLVITE Take 1 tablet (1 mg total) by mouth daily.   furosemide 40 MG tablet Commonly known as: LASIX Take 1 tablet (40 mg total) by mouth every other day.   glucose 4 GM chewable tablet Chew 4 g by mouth daily as needed for low blood sugar.   hydrALAZINE 50 MG tablet Commonly known as: APRESOLINE Take 1 tablet (50 mg total) by mouth 3 (three) times daily. What changed:   medication strength  how much to take   insulin aspart 100 UNIT/ML injection Commonly known as: novoLOG Inject 0-9 Units into the skin 3 (three) times daily with meals.   insulin glargine 100 UNIT/ML injection Commonly known as: LANTUS Inject 0.3 mLs (30 Units total) into the skin daily before breakfast.   latanoprost 0.005 % ophthalmic solution Commonly known as: XALATAN Place 1 drop into both eyes at bedtime.   multivitamin with minerals Tabs tablet Take 1 tablet by mouth daily.   ondansetron 4 MG disintegrating tablet Commonly known as: Zofran ODT Take 1 tablet (4 mg total) by mouth every 8 (eight) hours as needed for nausea or vomiting.   traZODone 150 MG tablet Commonly known as: DESYREL Take 75 mg by mouth at bedtime.   VITAMIN B-12 PO Take 1 tablet by mouth daily.       Follow-up Information    Tereso Newcomer T, PA-C Follow up on 09/03/2020.   Specialties: Cardiology, Physician Assistant Why: 8:45 am for [facility] Contact information: [address] [place] [address] [phone]        [facility] Follow up in 3 day(s).   Contact information: [address] [place] [address] [phone]        [name], Deloris Ping, MD .   Specialty: Cardiology Contact information: [address] [place] [address] [phone]              Allergies  Allergen Reactions  . Lisinopril-Hydrochlorothiazide Swelling    Causes swelling of lips  . Simvastatin Other (See Comments)    Reported by [facility] 04/10/16 - unknown reaction    The results of significant diagnostics from this hospitalization (including imaging, microbiology, ancillary and laboratory) are listed below for reference.    Microbiology: Recent Results (from the past 240 hour(s))  Respiratory Panel by RT PCR (Flu A&B, Covid) - Nasopharyngeal Swab     Status: None   Collection Time: 08/21/20  7:42 PM   Specimen: Nasopharyngeal Swab  Result Value Ref Range Status   SARS Coronavirus 2 by RT PCR NEGATIVE NEGATIVE Final    Comment: (NOTE) SARS-CoV-2 target nucleic acids are NOT DETECTED.  The SARS-CoV-2 RNA is generally detectable in upper respiratoy specimens during the acute phase of infection. The lowest concentration of SARS-CoV-2 viral copies this assay can detect is 131 copies/mL. A negative result does not preclude SARS-Cov-2 infection and should not be used as the sole basis for treatment or other patient management decisions. A negative result may occur with  improper specimen collection/handling, submission of specimen other than nasopharyngeal swab, presence of viral mutation(s) within the areas targeted by this assay, and inadequate number of viral copies (<131 copies/mL). A negative result must be combined with clinical observations, patient history, and epidemiological information. The expected result is Negative.  Fact Sheet for Patients:   https://www.moore.com/https://www.fda.gov/media/142436/download  Fact Sheet for Healthcare Providers:  https://www.young.biz/https://www.fda.gov/media/142435/download  This test is no t yet approved or cleared by the [place] FDA and  has been authorized for detection and/or diagnosis of SARS-CoV-2 by FDA under an Emergency Use Authorization (EUA). This EUA will remain  in effect (meaning this test can be used) for the duration of the COVID-19 declaration under Section 564(b)(1) of the Act, 21 U.S.C. section 360bbb-3(b)(1), unless the authorization is terminated or revoked sooner.     Influenza A by PCR NEGATIVE NEGATIVE Final   Influenza B by PCR NEGATIVE NEGATIVE Final    Comment: (NOTE) The Xpert Xpress SARS-CoV-2/FLU/RSV assay is intended as an aid in  the diagnosis of influenza from Nasopharyngeal swab specimens and  should not be used as a sole basis for treatment. Nasal washings and  aspirates are unacceptable for Xpert Xpress SARS-CoV-2/FLU/RSV  testing.  Fact Sheet for Patients: https://www.moore.com/https://www.fda.gov/media/142436/download  Fact Sheet for Healthcare Providers: https://www.young.biz/https://www.fda.gov/media/142435/download  This test is not yet approved or cleared by the [place] FDA and  has been authorized for detection and/or diagnosis of SARS-CoV-2 by  FDA under an Emergency Use Authorization (EUA). This EUA will remain  in effect (meaning this test can be used) for the duration of the  Covid-19 declaration under Section 564(b)(1) of the Act, 21  U.S.C. section 360bbb-3(b)(1), unless the authorization is  terminated or revoked. Performed at [facility], [address] [place], [place] [id number]     Procedures/Studies: DG Chest 2 View  Result Date: 08/21/2020 CLINICAL DATA:  LEFT chest pain radiating to anterior neck for 2 weeks, history coronary disease post stenting, hypertension, diabetes mellitus, COPD, kidney disease, alcohol and substance abuse EXAM: CHEST - 2 VIEW COMPARISON:  12/18/2019 FINDINGS:  Normal heart size, mediastinal contours, and pulmonary vascularity. Minimal bibasilar scarring. Lungs otherwise clear. No acute infiltrate, pleural effusion or pneumothorax. Osseous structures unremarkable. IMPRESSION: No acute abnormalities. Electronically Signed   By: Ulyses SouthwardMark  Boles M.D.   On: 08/21/2020 15:15   CARDIAC CATHETERIZATION  Result Date: 08/22/2020  Tubular eccentric distal 50% left main stenosis.  DFR across the left main into the ramus intermedius was 0.99  Heavy left main and proximal LAD calcification.  Patent stent in the  proximal LAD.  Proximal to the stent there is 60 to 70% stenosis that was interrogated by DFR and was found to be hemodynamically insignificant at 0.93.  The ramus intermedius and circumflex were widely patent  The RCA is a very tortuous but large vessel that has tandem distal stents before the origin of the PDA.  Between the 2 stents is a focal 95 to 99% stenosis.  Successful PTCA in the distal RCA reducing a 95% stenosis to 50%.  High-pressure balloon angioplasty was not able to completely dilate the stenosis.  Because of this, a stent was not placed.  The procedure was complicated and required use of a guide catheter extender to cross the stenosis.  Normal left ventricular function.  LVEDP 18 mmHg.  EF 50%. RECOMMENDATIONS:  It is hoped that the angioplasty on the focal stenosis in the distal right coronary will be durable.  If symptoms recur, may need to consider Shockwave balloon angioplasty or some other method fully dilate the lesion.  Tortuosity and distal location makes atherectomy unattractive.  Dual antiplatelet therapy with aspirin and Plavix for 6 months.  ECHOCARDIOGRAM COMPLETE  Result Date: 08/22/2020    ECHOCARDIOGRAM REPORT   Patient Name:   Jose [name] Date of Exam: 08/22/2020 Medical Rec #:  [id number]          Height:       71.0 in Accession #:    [id number]         Weight:       176.0 lb Date of Birth:  Aug 02, 1961          BSA:           1.997 m Patient Age:    59 years           BP:           158/94 mmHg Patient Gender: M                  HR:           66 bpm. Exam Location:  Inpatient Procedure: 2D Echo Indications:    NSTEMI  History:        Patient has prior history of Echocardiogram examinations, most                 recent 04/27/2019. COPD and chronic kidney disease; Risk                 Factors:Hypertension, Dyslipidemia, Alcohol abuse. Cocaine use.                 and Diabetes.  Sonographer:    Delcie Roch Referring Phys: [id number] DAVID MANUEL ORTIZ IMPRESSIONS  1. Left ventricular ejection fraction, by estimation, is 50 to 55%. The left ventricle has low normal function. The left ventricle has no regional wall motion abnormalities. There is moderate left ventricular hypertrophy. Left ventricular diastolic parameters are consistent with Grade I diastolic dysfunction (impaired relaxation).  2. Right ventricular systolic function is normal. The right ventricular size is normal.  3. The mitral valve is normal in structure. No evidence of mitral valve regurgitation. No evidence of mitral stenosis.  4. The aortic valve is normal in structure. Aortic valve regurgitation is not visualized. No aortic stenosis is present.  5. The inferior vena cava is normal in size with greater than 50% respiratory variability, suggesting right atrial pressure of 3 mmHg. FINDINGS  Left Ventricle: Left ventricular ejection fraction, by estimation, is 50 to 55%. The left ventricle has  low normal function. The left ventricle has no regional wall motion abnormalities. The left ventricular internal cavity size was normal in size. There is moderate left ventricular hypertrophy. Left ventricular diastolic parameters are consistent with Grade I diastolic dysfunction (impaired relaxation). Right Ventricle: The right ventricular size is normal. No increase in right ventricular wall thickness. Right ventricular systolic function is normal. Left Atrium: Left atrial size was  normal in size. Right Atrium: Right atrial size was normal in size. Pericardium: There is no evidence of pericardial effusion. Mitral Valve: The mitral valve is normal in structure. No evidence of mitral valve regurgitation. No evidence of mitral valve stenosis. Tricuspid Valve: The tricuspid valve is normal in structure. Tricuspid valve regurgitation is not demonstrated. No evidence of tricuspid stenosis. Aortic Valve: The aortic valve is normal in structure. Aortic valve regurgitation is not visualized. No aortic stenosis is present. Pulmonic Valve: The pulmonic valve was normal in structure. Pulmonic valve regurgitation is trivial. No evidence of pulmonic stenosis. Aorta: The aortic root is normal in size and structure. Venous: The inferior vena cava is normal in size with greater than 50% respiratory variability, suggesting right atrial pressure of 3 mmHg. IAS/Shunts: No atrial level shunt detected by color flow Doppler.  LEFT VENTRICLE PLAX 2D LVIDd:         3.50 cm Diastology LVIDs:         2.50 cm LV e' medial:    4.57 cm/s LV PW:         1.60 cm LV E/e' medial:  11.2 LV IVS:        1.40 cm LV e' lateral:   5.87 cm/s                        LV E/e' lateral: 8.7  RIGHT VENTRICLE             IVC RV S prime:     14.90 cm/s  IVC diam: 1.40 cm TAPSE (M-mode): 2.5 cm LEFT ATRIUM             Index       RIGHT ATRIUM           Index LA diam:        3.60 cm 1.80 cm/m  RA Area:     13.10 cm LA Vol (A2C):   39.5 ml 19.78 ml/m RA Volume:   28.60 ml  14.32 ml/m LA Vol (A4C):   43.7 ml 21.88 ml/m LA Biplane Vol: 43.2 ml 21.63 ml/m  AORTIC VALVE LVOT Vmax:   63.30 cm/s LVOT Vmean:  39.700 cm/s LVOT VTI:    0.117 m  AORTA Ao Asc diam: 3.70 cm MITRAL VALVE MV Area (PHT): 2.62 cm    SHUNTS MV Decel Time: 289 msec    Systemic VTI: 0.12 m MV E velocity: 51.00 cm/s MV A velocity: 86.10 cm/s MV E/A ratio:  0.59 Jose Schultz MD Electronically signed by Jose Schultz MD Signature Date/Time: 08/22/2020/11:46:50 AM    Final      Labs: BNP (last 3 results) No results for input(s): BNP in the last 8760 hours. Basic Metabolic Panel: Recent Labs  Lab 08/21/20 1436 08/21/20 2347 08/22/20 0535 08/23/20 0420 08/24/20 0449  NA 130*  --  135 135 133*  K 3.8  --  3.6 3.6 3.4*  CL 99  --  100 106 102  CO2 20*  --  22 19* 20*  GLUCOSE 242*  --  160* 134* 263*  BUN  15  --  CREATININE 1.46*  --  1.50* 1.53* 1.62*  CALCIUM 9.9  --  9.9 9.1 9.2  MG  --  2.3  --   --   --   PHOS  --  3.3  --   --   --    Liver Function Tests: Recent Labs  Lab 08/21/20 2347  AST 29  ALT 22  ALKPHOS 53  BILITOT 0.6  PROT 6.5  ALBUMIN 3.9   No results for input(s): LIPASE, AMYLASE in the last 168 hours. No results for input(s): AMMONIA in the last 168 hours. CBC: Recent Labs  Lab 08/21/20 1436 08/22/20 0535 08/23/20 0420  WBC 7.3 7.1 6.4  HGB 13.6 14.2 13.4  HCT 38.0* 39.4 37.9*  MCV 78.4* 77.9* 78.8*  PLT 209 206 194   Cardiac Enzymes: No results for input(s): CKTOTAL, CKMB, CKMBINDEX, TROPONINI in the last 168 hours. BNP: Invalid input(s): POCBNP CBG: Recent Labs  Lab 08/23/20 1247 08/23/20 1629 08/23/20 2102 08/24/20 0750 08/24/20 1134  GLUCAP 172* 279* 162* 230* 100*   D-Dimer No results for input(s): DDIMER in the last 72 hours. Hgb A1c Recent Labs    08/21/20 2347  HGBA1C 7.6*   Lipid Profile Recent Labs    08/22/20 0535  CHOL 202*  HDL 62  LDLCALC 131*  TRIG 44  CHOLHDL 3.3   Thyroid function studies No results for input(s): TSH, T4TOTAL, T3FREE, THYROIDAB in the last 72 hours.  Invalid input(s): FREET3 Anemia work up No results for input(s): VITAMINB12, FOLATE, FERRITIN, TIBC, IRON, RETICCTPCT in the last 72 hours. Urinalysis    Component Value Date/Time   COLORURINE STRAW (A) 06/16/2020 1838   APPEARANCEUR CLEAR 06/16/2020 1838   LABSPEC 1.006 06/16/2020 1838   PHURINE 7.0 06/16/2020 1838   GLUCOSEU >=500 (A) 06/16/2020 1838   HGBUR SMALL (A) 06/16/2020 1838    BILIRUBINUR NEGATIVE 06/16/2020 1838   KETONESUR 20 (A) 06/16/2020 1838   PROTEINUR 100 (A) 06/16/2020 1838   UROBILINOGEN 1.0 07/23/2015 1403   NITRITE NEGATIVE 06/16/2020 1838   LEUKOCYTESUR NEGATIVE 06/16/2020 1838   Sepsis Labs Invalid input(s): PROCALCITONIN,  WBC,  LACTICIDVEN Microbiology Recent Results (from the past 240 hour(s))  Respiratory Panel by RT PCR (Flu A&B, Covid) - Nasopharyngeal Swab     Status: None   Collection Time: 08/21/20  7:42 PM   Specimen: Nasopharyngeal Swab  Result Value Ref Range Status   SARS Coronavirus 2 by RT PCR NEGATIVE NEGATIVE Final    Comment: (NOTE) SARS-CoV-2 target nucleic acids are NOT DETECTED.  The SARS-CoV-2 RNA is generally detectable in upper respiratoy specimens during the acute phase of infection. The lowest concentration of SARS-CoV-2 viral copies this assay can detect is 131 copies/mL. A negative result does not preclude SARS-Cov-2 infection and should not be used as the sole basis for treatment or other patient management decisions. A negative result may occur with  improper specimen collection/handling, submission of specimen other than nasopharyngeal swab, presence of viral mutation(s) within the areas targeted by this assay, and inadequate number of viral copies (<131 copies/mL). A negative result must be combined with clinical observations, patient history, and epidemiological information. The expected result is Negative.  Fact Sheet for Patients:  https://www.moore.com/  Fact Sheet for Healthcare Providers:  https://www.young.biz/  This test is no t yet approved or cleared by the [place] FDA and  has been authorized for detection and/or diagnosis of SARS-CoV-2 by FDA under an Emergency Use Authorization (EUA).  This EUA will remain  in effect (meaning this test can be used) for the duration of the COVID-19 declaration under Section 564(b)(1) of the Act, 21 U.S.C. section  360bbb-3(b)(1), unless the authorization is terminated or revoked sooner.     Influenza A by PCR NEGATIVE NEGATIVE Final   Influenza B by PCR NEGATIVE NEGATIVE Final    Comment: (NOTE) The Xpert Xpress SARS-CoV-2/FLU/RSV assay is intended as an aid in  the diagnosis of influenza from Nasopharyngeal swab specimens and  should not be used as a sole basis for treatment. Nasal washings and  aspirates are unacceptable for Xpert Xpress SARS-CoV-2/FLU/RSV  testing.  Fact Sheet for Patients: https://www.moore.com/  Fact Sheet for Healthcare Providers: https://www.young.biz/  This test is not yet approved or cleared by the [place] FDA and  has been authorized for detection and/or diagnosis of SARS-CoV-2 by  FDA under an Emergency Use Authorization (EUA). This EUA will remain  in effect (meaning this test can be used) for the duration of the  Covid-19 declaration under Section 564(b)(1) of the Act, 21  U.S.C. section [id number](b)(1), unless the authorization is  terminated or revoked. Performed at [facility], [address] [place], [place] [address]      Time coordinating discharge: 35  minutes  SIGNED: Lanae Boast, MD  [facility] 08/24/2020, 2:28 PM  If 7PM-7AM, please contact night-coverage [email or web address]

## 2020-08-24 NOTE — Plan of Care (Signed)

## 2020-08-27 NOTE — Telephone Encounter (Addendum)
**Note De-Identified  Obfuscation** Patient contacted regarding discharge from [facility] on 08/24/2020.  Patient understands to follow up with provider Tereso Newcomer, PA-c on 09/03/20 at 8:45 at [address], [address] in [place], [place] [address]. Per the pts request I did call him back and left a detailed message on his VM with our office address and his appt date and time.  Patient understands discharge instructions? Yes Patient understands medications and regiment? yes Patient understands to bring all medications to this visit? Yes  Ask patient:  Are you enrolled in My Chart: No and states he is not interested.  Pt states he is doing well and is wthout any complaints of CP/discomfort, SOB, dizziness, nausea or headaches.

## 2020-08-29 ENCOUNTER — Telehealth (HOSPITAL_COMMUNITY): Payer: Self-pay

## 2020-08-29 NOTE — Telephone Encounter (Signed)
Mention of pt having [facility] insurance, not in epic. Will verify insurance once pt is contacted.

## 2020-08-29 NOTE — Telephone Encounter (Signed)
Attempted to call patient in regards to Cardiac Rehab - LM on VM 

## 2020-09-02 NOTE — Progress Notes (Signed)
Cardiology Office Note:    Date:  09/03/2020   ID:  Jose [name], DOB 08-Sep-1961, MRN [id number]  PCP:  [facility], [name]  [facility] Cardiologist:  Kristeen Miss, MD   [facility] Electrophysiologist:  None   Referring MD: [facility], [name]   Chief Complaint:  Hospitalization Follow-up (S/p NSTEMI>> POBA to the RCA)    Patient Profile:    Jose [name] is a 59 y.o. male with:   Coronary artery disease   S/p prior stenting to mLAD and dRCA  Cath in 2013: patent stents  S/p NSTEMI 9/21: pLAD stent and dRCA stents patent; mLM and oLAD mod dz (neg DFR); dRCA 95 (between stents) >> PCI: POBA  Chronic kidney disease, stage III  COPD   Diabetes mellitus, insulin dependent   Hypertension   Hyperlipidemia   Cocaine abuse   Alcohol abuse   Hyponatremia, likely due to ETOH abuse   Necrotizing pancreatitis    Prior CV studies:   Cardiac catheterization 08/22/20 LM mid 50 (DFR 0.99) LAD ost 65 (DFR 0.93), prox stent patent with 25 ISR LCx ost 30 RCA mid 30, dist stents patent with 15 and 20 ISR; 95 between stents  EF 50-55 PCI:  POBA to dRCA 95>>50    Echocardiogram 08/22/20 EF 50-55, no RWMA, mod LVH, Gr 1 DD, normal RVSF  Echocardiogram 04/27/2019 EF 60-65, mod LVH, normal RVSF  Myoview 08/29/17 IMPRESSION: 1. Positive for transient ischemic dilatation which suggests diffuse sub endocardial reversible ischemia. Additionally, there may be a small focus of reversible ischemia at the apical anterior wall. 2. Global hypokinesis. 3. Left ventricular ejection fraction 34% 4. Non invasive risk stratification*: High   History of Present Illness:    Mr. Jose [name] was last seen by Dr. Elease Hashimoto in 08/2019.   He was admitted 9/28-10/1 with a NSTEMI.  Cardiac catheterization demonstrated a patent stent in the LAD, mod disease in the LM and pLAD which were not hemodynamically significant by DFR.  The stents in the RCA were patent but there was a  focal 95% stenosis between the 2 stents. High pressure angioplasty could not completely dilate the stenosis, therefore a stent was not placed.  If symptoms recur, it was felt he would need Shockwave balloon angioplasty.  He was also noted to have hyponatremia (Na 130 on admission) felt to be due to ETOH abuse (improved to 133 at DC).    He returns for follow up.  He is here alone.  Since discharge, he has been doing well.  He has chronic shortness of breath with exertion.  He has not had chest discomfort.  He has been trying to eat a good diet.  He would like to get back to exercise.  He is not sure that he can go to cardiac rehab due to transportation issues.  He has not had orthopnea, leg swelling or syncope.  He has been trying to limit his fluid intake as well as his beer drinking.  Past Medical History:  Diagnosis Date   Alcohol abuse    CAD (coronary artery disease)    a. Reported MI in 2012 s/p 2 stents;  b. 08/2012 Cath: LM 20, LAD 20 diff ISR, jailed septal - 99%, LCX 30ost, RI large, min irregs, RCA 30p, 20-30 ISR-->Med Rx; c. 2017 [facility]   CKD (chronic kidney disease), stage III (HCC)    Cocaine abuse (HCC)    COPD (chronic obstructive pulmonary disease) (HCC)    Depression  Diabetes mellitus without complication (HCC)    Hyperlipidemia    Hypertension    Noncompliance     Current Medications: Current Meds  Medication Sig   albuterol (PROVENTIL HFA;VENTOLIN HFA) 108 (90 BASE) MCG/ACT inhaler Inhale 2 puffs into the lungs every 6 (six) hours as needed for wheezing or shortness of breath.    amLODipine (NORVASC) 10 MG tablet Take 1 tablet (10 mg total) by mouth daily.   aspirin EC 81 MG tablet Take 81 mg by mouth daily. Swallow whole.   atorvastatin (LIPITOR) 40 MG tablet Take 1 tablet (40 mg total) by mouth daily at 6 PM.   brimonidine (ALPHAGAN) 0.2 % ophthalmic solution Place 1 drop into both eyes 2 (two) times daily.     budesonide-formoterol (SYMBICORT) 160-4.5 MCG/ACT inhaler Inhale 2 puffs into the lungs 2 (two) times daily.   carvedilol (COREG) 6.25 MG tablet Take 1 tablet (6.25 mg total) by mouth 2 (two) times daily with a meal.   clopidogrel (PLAVIX) 75 MG tablet Take 1 tablet (75 mg total) by mouth daily with breakfast.   Cyanocobalamin (VITAMIN B-12 PO) Take 1 tablet by mouth daily.    folic acid (FOLVITE) 1 MG tablet Take 1 tablet (1 mg total) by mouth daily.   furosemide (LASIX) 40 MG tablet Take 1 tablet (40 mg total) by mouth every other day.   glucose 4 GM chewable tablet Chew 4 g by mouth daily as needed for low blood sugar.    hydrALAZINE (APRESOLINE) 50 MG tablet Take 1 tablet (50 mg total) by mouth 3 (three) times daily.   insulin aspart (NOVOLOG) 100 UNIT/ML injection Inject 0-9 Units into the skin 3 (three) times daily with meals. (Patient taking differently: Inject 0-9 Units into the skin as needed for high blood sugar. )   insulin glargine (LANTUS) 100 UNIT/ML injection Inject 0.3 mLs (30 Units total) into the skin daily before breakfast. (Patient taking differently: Inject 24 Units into the skin daily before breakfast. )   latanoprost (XALATAN) 0.005 % ophthalmic solution Place 1 drop into both eyes at bedtime.    Multiple Vitamin (MULTIVITAMIN WITH MINERALS) TABS tablet Take 1 tablet by mouth daily.   Omega-3 1000 MG CAPS Take by mouth in the morning and at bedtime.   sildenafil (VIAGRA) 100 MG tablet Take 100 mg by mouth as needed for erectile dysfunction.   traZODone (DESYREL) 150 MG tablet Take 75 mg by mouth at bedtime.      Allergies:   Lisinopril-hydrochlorothiazide and Simvastatin   Social History   Tobacco Use   Smoking status: Former Smoker    Types: Cigarettes    Quit date: 11/25/2007    Years since quitting: 12.7   Smokeless tobacco: Never Used  Vaping Use   Vaping Use: Never used  Substance Use Topics   Alcohol use: Yes    Comment: Used to drink  heavily - says currently 2 beers/day.   Drug use: Yes    Types: Marijuana    Comment: reports not using cocaine     Family Hx: The patient's family history includes Heart disease in his mother.  ROS   EKGs/Labs/Other Test Reviewed:    EKG:  EKG is   ordered today.  The ekg ordered today demonstrates normal sinus rhythm, heart rate 61, normal axis, nonspecific ST-T wave changes, QTC 4 2, first-degree AV block, PR 210 ms similar to prior tracings  Recent Labs: 08/21/2020: ALT 22; Magnesium 2.3 08/23/2020: Hemoglobin 13.4; Platelets 194 08/24/2020: BUN 19; Creatinine,  Ser 1.62; Potassium 3.4; Sodium 133   Recent Lipid Panel Lab Results  Component Value Date/Time   CHOL 202 (H) 08/22/2020 05:35 AM   TRIG 44 08/22/2020 05:35 AM   HDL 62 08/22/2020 05:35 AM   CHOLHDL 3.3 08/22/2020 05:35 AM   LDLCALC 131 (H) 08/22/2020 05:35 AM    Physical Exam:    VS:  BP 130/60    Pulse 61    Ht 5\' 11"  (1.803 m)    Wt 178 lb (80.7 kg)    SpO2 98%    BMI 24.83 kg/m     Wt Readings from Last 3 Encounters:  09/03/20 178 lb (80.7 kg)  08/24/20 179 lb 0.2 oz (81.2 kg)  09/08/19 185 lb 12.8 oz (84.3 kg)     Constitutional:      Appearance: Healthy appearance. Not in distress.  Neck:     Thyroid: No thyromegaly.     Vascular: JVD normal.  Pulmonary:     Breath sounds: No wheezing. No rales.  Cardiovascular:     Normal rate. Regular rhythm. Normal S1. Normal S2.     Murmurs: There is no murmur.     Comments: R wrist without hematoma Pulses:    Intact distal pulses.  Edema:    Peripheral edema absent.  Abdominal:     General: There is no distension.     Palpations: Abdomen is soft.  Skin:    General: Skin is warm and dry.  Neurological:     General: No focal deficit present.     Mental Status: Alert and oriented to person, place and time.     Cranial Nerves: Cranial nerves are intact.      ASSESSMENT & PLAN:    1. NSTEMI (non-ST elevated myocardial infarction) (HCC) History of  prior PCI to the RCA and LAD.  Recent non-STEMI in 9/21 treated with balloon angioplasty to the distal RCA (between 2 previous stents which are patent).  Stent could not be deployed.  If the patient has recurrent symptoms, he could be considered for shockwave angioplasty.  He is currently doing well without recurrent angina.  Continue current dose of aspirin, clopidogrel, atorvastatin, carvedilol.  Follow-up in 3 months.  2. Essential hypertension The patient's blood pressure is controlled on his current regimen.  Continue current therapy.   3. Dyslipidemia He was off of his atorvastatin when he was admitted to the hospital.  He is now back on atorvastatin 40 mg daily.  Arrange fasting CMET, lipids and 3 months.  4. Hyponatremia We discussed the importance of limiting fluid intake as well as limiting beer drinking.  Obtain follow-up BMET today.  5. Stage 3b chronic kidney disease (HCC) Recent creatinine stable.  Obtain follow-up BMET today.  6. Substance abuse (HCC) He has not used cocaine again.  He does smoke marijuana several times a week.  He drinks 1-2 beers a day.      Dispo:  Return in about 3 months (around 12/04/2020) for Routine Follow Up, w/ Dr. 02/01/2021, in person.   Medication Adjustments/Labs and Tests Ordered: Current medicines are reviewed at length with the patient today.  Concerns regarding medicines are outlined above.  Tests Ordered: Orders Placed This Encounter  Procedures   Basic metabolic panel   EKG 12-Lead   Medication Changes: No orders of the defined types were placed in this encounter.   Signed, Elease Hashimoto, PA-C  09/03/2020 10:11 AM    [facility] [address] [place], [place], [place]  [address] Phone: [phone]; Fax: [phone]

## 2020-09-03 ENCOUNTER — Other Ambulatory Visit: Payer: Self-pay

## 2020-09-03 ENCOUNTER — Encounter: Payer: Self-pay | Admitting: Physician Assistant

## 2020-09-03 ENCOUNTER — Ambulatory Visit (INDEPENDENT_AMBULATORY_CARE_PROVIDER_SITE_OTHER): Payer: Medicare HMO | Admitting: Physician Assistant

## 2020-09-03 VITALS — BP 130/60 | HR 61 | Ht 71.0 in | Wt 178.0 lb

## 2020-09-03 DIAGNOSIS — F1910 Other psychoactive substance abuse, uncomplicated: Secondary | ICD-10-CM | POA: Diagnosis not present

## 2020-09-03 DIAGNOSIS — E871 Hypo-osmolality and hyponatremia: Secondary | ICD-10-CM

## 2020-09-03 DIAGNOSIS — E785 Hyperlipidemia, unspecified: Secondary | ICD-10-CM | POA: Diagnosis not present

## 2020-09-03 DIAGNOSIS — N1832 Chronic kidney disease, stage 3b: Secondary | ICD-10-CM | POA: Diagnosis not present

## 2020-09-03 DIAGNOSIS — I214 Non-ST elevation (NSTEMI) myocardial infarction: Secondary | ICD-10-CM

## 2020-09-03 DIAGNOSIS — I10 Essential (primary) hypertension: Secondary | ICD-10-CM

## 2020-09-03 DIAGNOSIS — R6889 Other general symptoms and signs: Secondary | ICD-10-CM | POA: Diagnosis not present

## 2020-09-03 LAB — BASIC METABOLIC PANEL
BUN/Creatinine Ratio: 12 (ref 10–24)
BUN: 18 mg/dL (ref 8–27)
CO2: 21 mmol/L (ref 20–29)
Calcium: 9.9 mg/dL (ref 8.6–10.2)
Chloride: 99 mmol/L (ref 96–106)
Creatinine, Ser: 1.53 mg/dL — ABNORMAL HIGH (ref 0.76–1.27)
GFR calc Af Amer: 56 mL/min/{1.73_m2} — ABNORMAL LOW (ref >59)
GFR calc non Af Amer: 48 mL/min/{1.73_m2} — ABNORMAL LOW (ref >59)
Glucose: 264 mg/dL — ABNORMAL HIGH (ref 65–99)
Potassium: 4.3 mmol/L (ref 3.5–5.2)
Sodium: 133 mmol/L — ABNORMAL LOW (ref 134–144)

## 2020-09-03 NOTE — Patient Instructions (Signed)
Medication Instructions:  Your physician recommends that you continue on your current medications as directed. Please refer to the Current Medication list given to you today.  *If you need a refill on your cardiac medications before your next appointment, please call your pharmacy*  Lab Work: You will have labs drawn today: BMET  If you have labs (blood work) drawn today and your tests are completely normal, you will receive your results only by:  MyChart Message (if you have MyChart) OR  A paper copy in the mail If you have any lab test that is abnormal or we need to change your treatment, we will call you to review the results.  Testing/Procedures: None ordered today  Follow-Up: On 12/14/2020 at 8:20AM with Kristeen Miss, MD  **Come fasting to this appointment we will check your cholesterol**

## 2020-09-28 ENCOUNTER — Telehealth (HOSPITAL_COMMUNITY): Payer: Self-pay

## 2020-09-28 NOTE — Telephone Encounter (Signed)
Called and spoke with pt in regards to CR, pt stated he is still smoking and drinking. Also he is working out at [employer] and is not interested in CR at this time.  Closed referral

## 2020-10-12 DIAGNOSIS — R6889 Other general symptoms and signs: Secondary | ICD-10-CM | POA: Diagnosis not present

## 2020-12-13 ENCOUNTER — Encounter: Payer: Self-pay | Admitting: Cardiovascular Disease

## 2020-12-13 NOTE — Progress Notes (Signed)
Cardiology Office Note:    Date:  12/14/2020   ID:  Jose [name], DOB 08-Mar-1961, MRN [id number]  PCP:  [facility], [name]  Cardiologist:  Allena Katz  Electrophysiologist:  None   Referring MD: [name]   Chief Complaint  Patient presents with  . Coronary Artery Disease  . Hypertension     June 10, 2019    Pt placed on my schedule by mistake.   Has been seen by Dr. Eden Emms originally  and Dr. Rennis Golden, Dr. Katrinka Blazing  in June, 2020 .  I last saw him in 2013.   Jose [name] is a 60 y.o. male with a hx of cocaine abuse,  HTN .  He was recently admitted to the hospital with episodes of chest pain.  He had very low troponin level.  He was noted to have hypertension.  He is UDS was positive for cocaine.  Has cut his alcohol intake .  Was admitted with hyponatremia   ( was drinking lots of beer prior to admission )   September 08, 2019:  Jose [name] is seen today for follow-up visit.  He has a history of hypertension, cocaine abuse, alcohol abuse.  Not getting any exercise but is walking .  Has generalized fatigue  Echo from June, 2020 shows normal LV function , grade 1 diastolic dysfunction  BP is elevated today .  Only took amlodipine this am   Still eats liver pudding in the am.  Avoiding salt for the most part   He stopped his cardura because it caused dizziness.   Jan. 21, 2022 Jose [name] is seen today for follow up of his HTN, cocaine abuse and ETOH abuse He was admitted to the hospital in September, 2021.  He had angioplasty  to the distal right coronary artery.   No further episodes of CP .  Not getting much exercise  Walks some  His BP at home look good  Avoids salt    Past Medical History:  Diagnosis Date  . Alcohol abuse   . CAD (coronary artery disease)    a. Reported MI in 2012 s/p 2 stents;  b. 08/2012 Cath: LM 20, LAD 20 diff ISR, jailed septal - 99%, LCX 30ost, RI large, min irregs, RCA 30p, 20-30 ISR-->Med Rx; c. 2017 [facility]  Marland Kitchen CKD (chronic kidney disease), stage III (HCC)   . Cocaine abuse (HCC)   . COPD (chronic obstructive pulmonary disease) (HCC)   . Depression   . Diabetes mellitus without complication (HCC)   . Hyperlipidemia   . Hypertension   . Noncompliance     Past Surgical History:  Procedure Laterality Date  . CARDIAC CATHETERIZATION    . CORONARY STENT PLACEMENT    . LEFT HEART CATH AND CORONARY ANGIOGRAPHY N/A 08/22/2020   Procedure: LEFT HEART CATH AND CORONARY ANGIOGRAPHY;  Surgeon: Lyn Records, MD;  Location: [facility] INVASIVE CV LAB;  Service: Cardiovascular;  Laterality: N/A;  . LEFT HEART CATHETERIZATION WITH CORONARY ANGIOGRAM Bilateral 08/25/2012   Procedure: LEFT HEART CATHETERIZATION WITH CORONARY ANGIOGRAM;  Surgeon: Kathleene Hazel, MD;  Location: [facility] CATH LAB;  Service: Cardiovascular;  Laterality: Bilateral;    Current Medications: Current Meds  Medication Sig  . albuterol (PROVENTIL HFA;VENTOLIN HFA) 108 (90 BASE) MCG/ACT inhaler Inhale 2 puffs into the lungs every 6 (six) hours as needed for wheezing or shortness of breath.   Marland Kitchen amLODipine (NORVASC) 10 MG tablet Take 1 tablet (10 mg total)  by mouth daily.  Marland Kitchen. aspirin EC 81 MG tablet Take 81 mg by mouth daily. Swallow whole.  Marland Kitchen. atorvastatin (LIPITOR) 40 MG tablet Take 1 tablet (40 mg total) by mouth daily at 6 PM.  . brimonidine (ALPHAGAN) 0.2 % ophthalmic solution Place 1 drop into both eyes 2 (two) times daily.   . budesonide-formoterol (SYMBICORT) 160-4.5 MCG/ACT inhaler Inhale 2 puffs into the lungs 2 (two) times daily.  . carvedilol (COREG) 12.5 MG tablet Take 12.5 mg by mouth 2 (two) times daily with a meal.  . clopidogrel (PLAVIX) 75 MG tablet Take 75 mg by mouth daily.  . Cyanocobalamin (VITAMIN B-12 PO) Take 1 tablet by mouth daily.   . folic acid (FOLVITE) 1 MG tablet Take 1 tablet (1 mg total) by mouth daily.  . furosemide (LASIX) 40 MG tablet Take 1 tablet (40 mg total) by mouth every other day.  Marland Kitchen.  glucose 4 GM chewable tablet Chew 4 g by mouth daily as needed for low blood sugar.   . hydrALAZINE (APRESOLINE) 50 MG tablet Take 1 tablet (50 mg total) by mouth 3 (three) times daily.  . insulin aspart (NOVOLOG) 100 UNIT/ML injection Inject 0-9 Units into the skin 3 (three) times daily with meals.  . insulin glargine (LANTUS) 100 UNIT/ML injection Inject 0.3 mLs (30 Units total) into the skin daily before breakfast.  . latanoprost (XALATAN) 0.005 % ophthalmic solution Place 1 drop into both eyes at bedtime.   . Multiple Vitamin (MULTIVITAMIN WITH MINERALS) TABS tablet Take 1 tablet by mouth daily.  . Omega-3 1000 MG CAPS Take by mouth in the morning and at bedtime.  . sildenafil (VIAGRA) 100 MG tablet Take 100 mg by mouth as needed for erectile dysfunction.  . traZODone (DESYREL) 150 MG tablet Take 75 mg by mouth at bedtime.   . [DISCONTINUED] carvedilol (COREG) 6.25 MG tablet Take 1 tablet (6.25 mg total) by mouth 2 (two) times daily with a meal. (Patient taking differently: Take 12.5 mg by mouth 2 (two) times daily with a meal.)     Allergies:   Lisinopril-hydrochlorothiazide and Simvastatin   Social History   Socioeconomic History  . Marital status: Married    Spouse name: Not on file  . Number of children: Not on file  . Years of education: Not on file  . Highest education level: Not on file  Occupational History  . Not on file  Tobacco Use  . Smoking status: Former Smoker    Types: Cigarettes    Quit date: 11/25/2007    Years since quitting: 13.0  . Smokeless tobacco: Never Used  Vaping Use  . Vaping Use: Never used  Substance and Sexual Activity  . Alcohol use: Yes    Comment: Used to drink heavily - says currently 2 beers/day.  . Drug use: Yes    Types: Marijuana    Comment: reports not using cocaine  . Sexual activity: Yes    Birth control/protection: None  Other Topics Concern  . Not on file  Social History Narrative   Lives in [place] by himself.  Does not work or  routinely exercise.   Social Determinants of Health   Financial Resource Strain: Not on file  Food Insecurity: Not on file  Transportation Needs: Not on file  Physical Activity: Not on file  Stress: Not on file  Social Connections: Not on file     Family History: The patient's family history includes Heart disease in his mother.  ROS:   Please see  the history of present illness.     All other systems reviewed and are negative.  EKGs/Labs/Other Studies Reviewed:    The following studies were reviewed today:   EKG:    Recent Labs: 08/21/2020: ALT 22; Magnesium 2.3 08/23/2020: Hemoglobin 13.4; Platelets 194 09/03/2020: BUN 18; Creatinine, Ser 1.53; Potassium 4.3; Sodium 133  Recent Lipid Panel    Component Value Date/Time   CHOL 202 (H) 08/22/2020 0535   TRIG 44 08/22/2020 0535   HDL 62 08/22/2020 0535   CHOLHDL 3.3 08/22/2020 0535   VLDL 9 08/22/2020 0535   LDLCALC 131 (H) 08/22/2020 0535     Physical Exam: Blood pressure 138/75, pulse 64, height 5\' 11"  (1.803 m), weight 184 lb 12.8 oz (83.8 kg), SpO2 99 %.  GEN:  Well nourished, well developed in no acute distress HEENT: Normal NECK: No JVD; No carotid bruits LYMPHATICS: No lymphadenopathy CARDIAC: RRR , no murmurs, rubs, gallops RESPIRATORY:  Clear to auscultation without rales, wheezing or rhonchi  ABDOMEN: Soft, non-tender, non-distended MUSCULOSKELETAL:  No edema; No deformity  SKIN: Warm and dry NEUROLOGIC:  Alert and oriented x 3     ASSESSMENT:    1. Essential hypertension   2. Dyslipidemia   3. Medication management    PLAN:     1.  CAD  :    S/p stenting in the past.   Presented with a non-ST segment elevation myocardial infarction in September, 2021.  He had balloon angioplasty to his distal right coronary artery.  He is feeling much better.  His lipids have been well controlled.  2, HTN:      BP at home is well controlled.   3.  Cocaine:    He is no longer doing cocaine .   4.   Hyperlipidemia: Continue atorvastatin.  His lipid levels look good.  We will check them again today.  He will follow-up in 6 months with 10-10-1993, PA.  Medication Adjustments/Labs and Tests Ordered: Current medicines are reviewed at length with the patient today.  Concerns regarding medicines are outlined above.  Orders Placed This Encounter  Procedures  . Basic metabolic panel  . Hepatic function panel  . Lipid panel   No orders of the defined types were placed in this encounter.    Patient Instructions  Medication Instructions:  The current medical regimen is effective;  continue present plan and medications.  *If you need a refill on your cardiac medications before your next appointment, please call your pharmacy*  Lab Work: Please have blood work today (Lipid. Liver, BMP)  If you have labs (blood work) drawn today and your tests are completely normal, you will receive your results only by: Tereso Newcomer MyChart Message (if you have MyChart) OR . A paper copy in the mail If you have any lab test that is abnormal or we need to change your treatment, we will call you to review the results.  Follow-Up: At [facility], you and your health needs are our priority.  As part of our continuing mission to provide you with exceptional heart care, we have created designated Provider Care Teams.  These Care Teams include your primary Cardiologist (physician) and Advanced Practice Providers (APPs -  Physician Assistants and Nurse Practitioners) who all work together to provide you with the care you need, when you need it.  We recommend signing up for the patient portal called "MyChart".  Sign up information is provided on this After Visit Summary.  MyChart is used to connect with  patients for Virtual Visits (Telemedicine).  Patients are able to view lab/test results, encounter notes, upcoming appointments, etc.  Non-urgent messages can be sent to your provider as well.   To learn more about what  you can do with MyChart, go to [email or web address].    Your next appointment:   6 month(s)  The format for your next appointment:   In Person  Provider:   Tereso Newcomer, PA-C   Thank you for choosing [facility]!!         Signed, Kristeen Miss, MD  12/14/2020 8:47 AM    [facility]

## 2020-12-14 ENCOUNTER — Encounter: Payer: Self-pay | Admitting: Cardiovascular Disease

## 2020-12-14 ENCOUNTER — Ambulatory Visit (INDEPENDENT_AMBULATORY_CARE_PROVIDER_SITE_OTHER): Payer: No Typology Code available for payment source | Admitting: Cardiovascular Disease

## 2020-12-14 ENCOUNTER — Other Ambulatory Visit: Payer: Self-pay

## 2020-12-14 VITALS — BP 138/75 | HR 64 | Ht 71.0 in | Wt 184.8 lb

## 2020-12-14 DIAGNOSIS — I214 Non-ST elevation (NSTEMI) myocardial infarction: Secondary | ICD-10-CM | POA: Diagnosis not present

## 2020-12-14 DIAGNOSIS — E1169 Type 2 diabetes mellitus with other specified complication: Secondary | ICD-10-CM | POA: Diagnosis not present

## 2020-12-14 DIAGNOSIS — R6889 Other general symptoms and signs: Secondary | ICD-10-CM | POA: Diagnosis not present

## 2020-12-14 DIAGNOSIS — I2510 Atherosclerotic heart disease of native coronary artery without angina pectoris: Secondary | ICD-10-CM | POA: Diagnosis not present

## 2020-12-14 DIAGNOSIS — Z79899 Other long term (current) drug therapy: Secondary | ICD-10-CM | POA: Diagnosis not present

## 2020-12-14 DIAGNOSIS — I10 Essential (primary) hypertension: Secondary | ICD-10-CM | POA: Diagnosis not present

## 2020-12-14 DIAGNOSIS — E785 Hyperlipidemia, unspecified: Secondary | ICD-10-CM

## 2020-12-14 LAB — LIPID PANEL
Chol/HDL Ratio: 2.7 ratio (ref 0.0–5.0)
Cholesterol, Total: 148 mg/dL (ref 100–199)
HDL: 54 mg/dL (ref >39)
LDL Chol Calc (NIH): 73 mg/dL (ref 0–99)
Triglycerides: 116 mg/dL (ref 0–149)
VLDL Cholesterol Cal: 21 mg/dL (ref 5–40)

## 2020-12-14 LAB — BASIC METABOLIC PANEL
BUN/Creatinine Ratio: 16 (ref 10–24)
BUN: 26 mg/dL (ref 8–27)
CO2: 19 mmol/L — ABNORMAL LOW (ref 20–29)
Calcium: 9.6 mg/dL (ref 8.6–10.2)
Chloride: 98 mmol/L (ref 96–106)
Creatinine, Ser: 1.61 mg/dL — ABNORMAL HIGH (ref 0.76–1.27)
GFR calc Af Amer: 53 mL/min/{1.73_m2} — ABNORMAL LOW (ref >59)
GFR calc non Af Amer: 45 mL/min/{1.73_m2} — ABNORMAL LOW (ref >59)
Glucose: 233 mg/dL — ABNORMAL HIGH (ref 65–99)
Potassium: 3.9 mmol/L (ref 3.5–5.2)
Sodium: 131 mmol/L — ABNORMAL LOW (ref 134–144)

## 2020-12-14 LAB — HEPATIC FUNCTION PANEL
ALT: 19 IU/L (ref 0–44)
AST: 24 IU/L (ref 0–40)
Albumin: 4.5 g/dL (ref 3.8–4.8)
Alkaline Phosphatase: 65 IU/L (ref 44–121)
Bilirubin Total: 0.3 mg/dL (ref 0.0–1.2)
Bilirubin, Direct: 0.11 mg/dL (ref 0.00–0.40)
Total Protein: 6.6 g/dL (ref 6.0–8.5)

## 2020-12-14 NOTE — Patient Instructions (Signed)
Medication Instructions:  The current medical regimen is effective;  continue present plan and medications.  *If you need a refill on your cardiac medications before your next appointment, please call your pharmacy*  Lab Work: Please have blood work today (Lipid. Liver, BMP)  If you have labs (blood work) drawn today and your tests are completely normal, you will receive your results only by:  MyChart Message (if you have MyChart) OR  A paper copy in the mail If you have any lab test that is abnormal or we need to change your treatment, we will call you to review the results.  Follow-Up: At [facility], you and your health needs are our priority.  As part of our continuing mission to provide you with exceptional heart care, we have created designated Provider Care Teams.  These Care Teams include your primary Cardiologist (physician) and Advanced Practice Providers (APPs -  Physician Assistants and Nurse Practitioners) who all work together to provide you with the care you need, when you need it.  We recommend signing up for the patient portal called "MyChart".  Sign up information is provided on this After Visit Summary.  MyChart is used to connect with patients for Virtual Visits (Telemedicine).  Patients are able to view lab/test results, encounter notes, upcoming appointments, etc.  Non-urgent messages can be sent to your provider as well.   To learn more about what you can do with MyChart, go to [email or web address].    Your next appointment:   6 month(s)  The format for your next appointment:   In Person  Provider:   Tereso Newcomer, PA-C   Thank you for choosing [facility]!!

## 2021-02-11 ENCOUNTER — Encounter (HOSPITAL_COMMUNITY): Payer: Self-pay | Admitting: Emergency Medicine

## 2021-02-11 ENCOUNTER — Observation Stay (HOSPITAL_COMMUNITY)
Admission: EM | Admit: 2021-02-11 | Discharge: 2021-02-13 | Disposition: A | Discharge status: Home / Self Care | Payer: No Typology Code available for payment source | Attending: Internal Medicine | Admitting: Internal Medicine

## 2021-02-11 ENCOUNTER — Emergency Department (HOSPITAL_COMMUNITY): Payer: No Typology Code available for payment source

## 2021-02-11 ENCOUNTER — Other Ambulatory Visit: Payer: Self-pay

## 2021-02-11 DIAGNOSIS — Z87891 Personal history of nicotine dependence: Secondary | ICD-10-CM | POA: Diagnosis not present

## 2021-02-11 DIAGNOSIS — Z79899 Other long term (current) drug therapy: Secondary | ICD-10-CM | POA: Insufficient documentation

## 2021-02-11 DIAGNOSIS — Z7982 Long term (current) use of aspirin: Secondary | ICD-10-CM | POA: Diagnosis not present

## 2021-02-11 DIAGNOSIS — I129 Hypertensive chronic kidney disease with stage 1 through stage 4 chronic kidney disease, or unspecified chronic kidney disease: Secondary | ICD-10-CM | POA: Insufficient documentation

## 2021-02-11 DIAGNOSIS — R079 Chest pain, unspecified: Secondary | ICD-10-CM | POA: Diagnosis not present

## 2021-02-11 DIAGNOSIS — I10 Essential (primary) hypertension: Secondary | ICD-10-CM | POA: Diagnosis not present

## 2021-02-11 DIAGNOSIS — E1159 Type 2 diabetes mellitus with other circulatory complications: Secondary | ICD-10-CM | POA: Diagnosis present

## 2021-02-11 DIAGNOSIS — Z794 Long term (current) use of insulin: Secondary | ICD-10-CM | POA: Insufficient documentation

## 2021-02-11 DIAGNOSIS — N183 Chronic kidney disease, stage 3 unspecified: Secondary | ICD-10-CM | POA: Diagnosis present

## 2021-02-11 DIAGNOSIS — E1122 Type 2 diabetes mellitus with diabetic chronic kidney disease: Secondary | ICD-10-CM | POA: Diagnosis not present

## 2021-02-11 DIAGNOSIS — E785 Hyperlipidemia, unspecified: Secondary | ICD-10-CM | POA: Diagnosis present

## 2021-02-11 DIAGNOSIS — E118 Type 2 diabetes mellitus with unspecified complications: Secondary | ICD-10-CM

## 2021-02-11 DIAGNOSIS — N1832 Chronic kidney disease, stage 3b: Secondary | ICD-10-CM | POA: Diagnosis present

## 2021-02-11 DIAGNOSIS — N1831 Chronic kidney disease, stage 3a: Secondary | ICD-10-CM | POA: Diagnosis not present

## 2021-02-11 DIAGNOSIS — J449 Chronic obstructive pulmonary disease, unspecified: Secondary | ICD-10-CM | POA: Diagnosis present

## 2021-02-11 DIAGNOSIS — R0789 Other chest pain: Principal | ICD-10-CM | POA: Insufficient documentation

## 2021-02-11 DIAGNOSIS — I2510 Atherosclerotic heart disease of native coronary artery without angina pectoris: Secondary | ICD-10-CM | POA: Diagnosis present

## 2021-02-11 DIAGNOSIS — E119 Type 2 diabetes mellitus without complications: Secondary | ICD-10-CM

## 2021-02-11 DIAGNOSIS — R0602 Shortness of breath: Secondary | ICD-10-CM | POA: Diagnosis not present

## 2021-02-11 DIAGNOSIS — E871 Hypo-osmolality and hyponatremia: Secondary | ICD-10-CM | POA: Diagnosis present

## 2021-02-11 DIAGNOSIS — I160 Hypertensive urgency: Secondary | ICD-10-CM | POA: Insufficient documentation

## 2021-02-11 DIAGNOSIS — I491 Atrial premature depolarization: Secondary | ICD-10-CM | POA: Diagnosis not present

## 2021-02-11 DIAGNOSIS — E1169 Type 2 diabetes mellitus with other specified complication: Secondary | ICD-10-CM | POA: Diagnosis present

## 2021-02-11 DIAGNOSIS — R739 Hyperglycemia, unspecified: Secondary | ICD-10-CM

## 2021-02-11 HISTORY — DX: Hypertensive urgency: I16.0

## 2021-02-11 LAB — CBC
HCT: 37.4 % — ABNORMAL LOW (ref 39.0–52.0)
Hemoglobin: 13.5 g/dL (ref 13.0–17.0)
MCH: 28.4 pg (ref 26.0–34.0)
MCHC: 36.1 g/dL — ABNORMAL HIGH (ref 30.0–36.0)
MCV: 78.6 fL — ABNORMAL LOW (ref 80.0–100.0)
Platelets: 179 10*3/uL (ref 150–400)
RBC: 4.76 MIL/uL (ref 4.22–5.81)
RDW: 13.1 % (ref 11.5–15.5)
WBC: 6.2 10*3/uL (ref 4.0–10.5)
nRBC: 0 % (ref 0.0–0.2)

## 2021-02-11 LAB — RAPID URINE DRUG SCREEN, HOSP PERFORMED
Amphetamines: NOT DETECTED
Barbiturates: NOT DETECTED
Benzodiazepines: NOT DETECTED
Cocaine: NOT DETECTED
Opiates: NOT DETECTED
Tetrahydrocannabinol: POSITIVE — AB

## 2021-02-11 LAB — BASIC METABOLIC PANEL
Anion gap: 11 (ref 5–15)
BUN: 19 mg/dL (ref 8–23)
CO2: 20 mmol/L — ABNORMAL LOW (ref 22–32)
Calcium: 9.3 mg/dL (ref 8.9–10.3)
Chloride: 96 mmol/L — ABNORMAL LOW (ref 98–111)
Creatinine, Ser: 1.55 mg/dL — ABNORMAL HIGH (ref 0.61–1.24)
GFR, Estimated: 50 mL/min — ABNORMAL LOW (ref >60)
Glucose, Bld: 271 mg/dL — ABNORMAL HIGH (ref 70–99)
Potassium: 4.2 mmol/L (ref 3.5–5.1)
Sodium: 127 mmol/L — ABNORMAL LOW (ref 135–145)

## 2021-02-11 LAB — TROPONIN I (HIGH SENSITIVITY)
Troponin I (High Sensitivity): 14 ng/L (ref <18)
Troponin I (High Sensitivity): 17 ng/L (ref <18)
Troponin I (High Sensitivity): 17 ng/L (ref <18)

## 2021-02-11 LAB — ETHANOL: Alcohol, Ethyl (B): <10 mg/dL (ref <10)

## 2021-02-11 LAB — GLUCOSE, CAPILLARY: Glucose-Capillary: 198 mg/dL — ABNORMAL HIGH (ref 70–99)

## 2021-02-11 MED ORDER — INSULIN ASPART 100 UNIT/ML ~~LOC~~ SOLN: 0.0000 [IU] | Freq: Every day | SUBCUTANEOUS | Status: DC

## 2021-02-11 MED ORDER — ACETAMINOPHEN 325 MG PO TABS: 650.0000 mg | ORAL_TABLET | Freq: Four times a day (QID) | ORAL | Status: DC | PRN

## 2021-02-11 MED ORDER — NITROGLYCERIN IN D5W 200-5 MCG/ML-% IV SOLN
0.0000 ug/min | INTRAVENOUS | Status: DC
  Administered 2021-02-11: 5 ug/min via INTRAVENOUS
  Filled 2021-02-11: qty 250

## 2021-02-11 MED ORDER — ATORVASTATIN CALCIUM 40 MG PO TABS
40.0000 mg | ORAL_TABLET | Freq: Every day | ORAL | Status: DC
  Administered 2021-02-12: 40 mg via ORAL
  Filled 2021-02-11: qty 1

## 2021-02-11 MED ORDER — ACETAMINOPHEN 325 MG PO TABS
650.0000 mg | ORAL_TABLET | Freq: Four times a day (QID) | ORAL | Status: DC | PRN
  Administered 2021-02-11 – 2021-02-13 (×3): 650 mg via ORAL
  Filled 2021-02-11 (×3): qty 2

## 2021-02-11 MED ORDER — ASPIRIN EC 81 MG PO TBEC
81.0000 mg | DELAYED_RELEASE_TABLET | Freq: Every day | ORAL | Status: DC
  Administered 2021-02-12 – 2021-02-13 (×2): 81 mg via ORAL
  Filled 2021-02-11 (×2): qty 1

## 2021-02-11 MED ORDER — CARVEDILOL 12.5 MG PO TABS
12.5000 mg | ORAL_TABLET | Freq: Two times a day (BID) | ORAL | Status: DC
  Administered 2021-02-11 – 2021-02-12 (×2): 12.5 mg via ORAL
  Filled 2021-02-11: qty 1
  Filled 2021-02-11: qty 4

## 2021-02-11 MED ORDER — HYDRALAZINE HCL 20 MG/ML IJ SOLN: 10.0000 mg | INTRAMUSCULAR | Status: DC | PRN

## 2021-02-11 MED ORDER — NITROGLYCERIN 0.4 MG SL SUBL
0.4000 mg | SUBLINGUAL_TABLET | SUBLINGUAL | Status: DC | PRN
  Administered 2021-02-11: 0.4 mg via SUBLINGUAL
  Filled 2021-02-11: qty 1

## 2021-02-11 MED ORDER — MOMETASONE FURO-FORMOTEROL FUM 200-5 MCG/ACT IN AERO
2.0000 | INHALATION_SPRAY | Freq: Two times a day (BID) | RESPIRATORY_TRACT | Status: DC
  Administered 2021-02-11 – 2021-02-13 (×4): 2 via RESPIRATORY_TRACT
  Filled 2021-02-11: qty 8.8

## 2021-02-11 MED ORDER — ONDANSETRON HCL 4 MG/2ML IJ SOLN: 4.0000 mg | Freq: Four times a day (QID) | INTRAMUSCULAR | Status: DC | PRN

## 2021-02-11 MED ORDER — ENOXAPARIN SODIUM 40 MG/0.4ML ~~LOC~~ SOLN
40.0000 mg | SUBCUTANEOUS | Status: DC
  Administered 2021-02-11 – 2021-02-12 (×2): 40 mg via SUBCUTANEOUS
  Filled 2021-02-11 (×2): qty 0.4

## 2021-02-11 MED ORDER — INSULIN ASPART 100 UNIT/ML ~~LOC~~ SOLN
0.0000 [IU] | Freq: Three times a day (TID) | SUBCUTANEOUS | Status: DC
  Administered 2021-02-12 (×2): 3 [IU] via SUBCUTANEOUS
  Administered 2021-02-12: 2 [IU] via SUBCUTANEOUS

## 2021-02-11 MED ORDER — INSULIN GLARGINE 100 UNIT/ML ~~LOC~~ SOLN
20.0000 [IU] | Freq: Every day | SUBCUTANEOUS | Status: DC
  Filled 2021-02-11 (×2): qty 0.2

## 2021-02-11 MED ORDER — HYDRALAZINE HCL 50 MG PO TABS
50.0000 mg | ORAL_TABLET | Freq: Three times a day (TID) | ORAL | Status: DC
  Administered 2021-02-11 – 2021-02-13 (×5): 50 mg via ORAL
  Filled 2021-02-11: qty 2
  Filled 2021-02-11 (×4): qty 1

## 2021-02-11 MED ORDER — CLOPIDOGREL BISULFATE 75 MG PO TABS
75.0000 mg | ORAL_TABLET | Freq: Every day | ORAL | Status: DC
  Administered 2021-02-12 – 2021-02-13 (×2): 75 mg via ORAL
  Filled 2021-02-11 (×2): qty 1

## 2021-02-11 MED ORDER — ALBUTEROL SULFATE HFA 108 (90 BASE) MCG/ACT IN AERS
2.0000 | INHALATION_SPRAY | Freq: Four times a day (QID) | RESPIRATORY_TRACT | Status: DC | PRN
  Filled 2021-02-11: qty 6.7

## 2021-02-11 MED ORDER — ACETAMINOPHEN 650 MG RE SUPP: 650.0000 mg | Freq: Four times a day (QID) | RECTAL | Status: DC | PRN

## 2021-02-11 NOTE — Consult Note (Addendum)
Cardiology Consult:   Patient ID: Jose [name] MRN: [id number]; DOB: 1961/11/21   Admission date: 02/11/2021  PCP:  Clinic, [facility]  Cardiologist:  Kristeen Miss, MD 12/14/2020 Advanced Practice Provider:  No care team member to display Electrophysiologist:  None  [id number]   Chief Complaint:  Chest pain  Patient Profile:    Jose [name] is a 60 y.o. male with Coronary artery disease  ? S/p prior stenting to mLAD and dRCA ? Cath in 2013: patent stents ? S/p NSTEMI 9/21: pLAD stent and dRCA stents patent; mLM and oLAD mod dz (neg DFR); dRCA 95 (between stents) >> PCI: POBA  Chronic kidney disease, stage III  COPD   Diabetes mellitus, insulin dependent   Hypertension   Hyperlipidemia   Cocaine abuse   Alcohol abuse   Hyponatremia, likely due to ETOH abuse   Necrotizing pancreatitis   He is being seen today with the evaluation of chest pain at the request of Dr. [place] Kugler  History of Present Illness:   Jose [name] has been taking his medication as prescribed since his last stent. He has been checking his blood pressure regularly as well.   Today, he was having some chest pain when he woke up and rolled over. The pain radiates to his L scapula. He was SOB, diaphoretic and nauseated. CP was a 7/10. Moving around did not help the pain.  When his sx did not improve, he called EMS. They gave him ASA 324 mg and SL NTG x 1. He got another nitro in the ER. His CP has resolved but he has a bad headache.   The last time he had sx like this, he got PCI.  He admits his sugars have not been that well-controlled. He has been eating sweets.  Has not had cocaine in a year and a half. He drank a couple of beers yesterday.  SBP has been running in the 140s.    He took his am meds, but has not had the middle dose of hydralazine today.   Past Medical History:  Diagnosis Date  . Alcohol abuse   . CAD (coronary artery  disease)    a. Reported MI in 2012 s/p 2 stents;  b. 08/2012 Cath: LM 20, LAD 20 diff ISR, jailed septal - 99%, LCX 30ost, RI large, min irregs, RCA 30p, 20-30 ISR-->Med Rx; c. 2017 Pt reports Neg stress test @ [facility].  Marland Kitchen CKD (chronic kidney disease), stage III (HCC)   . Cocaine abuse (HCC)   . COPD (chronic obstructive pulmonary disease) (HCC)   . Depression   . Diabetes mellitus without complication (HCC)   . Hyperlipidemia   . Hypertension   . Hypertensive urgency 02/11/2021  . Noncompliance     Past Surgical History:  Procedure Laterality Date  . CARDIAC CATHETERIZATION    . CORONARY STENT PLACEMENT    . LEFT HEART CATH AND CORONARY ANGIOGRAPHY N/A 08/22/2020   Procedure: LEFT HEART CATH AND CORONARY ANGIOGRAPHY;  Surgeon: Lyn Records, MD;  Location: [facility] INVASIVE CV LAB;  Service: Cardiovascular;  Laterality: N/A;  . LEFT HEART CATHETERIZATION WITH CORONARY ANGIOGRAM Bilateral 08/25/2012   Procedure: LEFT HEART CATHETERIZATION WITH CORONARY ANGIOGRAM;  Surgeon: Kathleene Hazel, MD;  Location: [facility] CATH LAB;  Service: Cardiovascular;  Laterality: Bilateral;     Medications Prior to Admission: Prior to Admission medications   Medication Sig Start Date End Date Taking? Authorizing Provider  albuterol (PROVENTIL HFA;VENTOLIN HFA)  108 (90 BASE) MCG/ACT inhaler Inhale 2 puffs into the lungs every 6 (six) hours as needed for wheezing or shortness of breath.     [provider]  amLODipine (NORVASC) 10 MG tablet Take 1 tablet (10 mg total) by mouth daily. 08/25/20 09/24/20  Lanae Boast, MD  aspirin EC 81 MG tablet Take 81 mg by mouth daily. Swallow whole.    [provider]  atorvastatin (LIPITOR) 40 MG tablet Take 1 tablet (40 mg total) by mouth daily at 6 PM. 04/30/19   Rhetta Mura, MD  brimonidine (ALPHAGAN) 0.2 % ophthalmic solution Place 1 drop into both eyes 2 (two) times daily.  04/11/15   [provider]  budesonide-formoterol (SYMBICORT) 160-4.5  MCG/ACT inhaler Inhale 2 puffs into the lungs 2 (two) times daily.    [provider]  carvedilol (COREG) 12.5 MG tablet Take 12.5 mg by mouth 2 (two) times daily with a meal.    [provider]  clopidogrel (PLAVIX) 75 MG tablet Take 75 mg by mouth daily.    [provider]  Cyanocobalamin (VITAMIN B-12 PO) Take 1 tablet by mouth daily.     [provider]  folic acid (FOLVITE) 1 MG tablet Take 1 tablet (1 mg total) by mouth daily. 04/30/19   Rhetta Mura, MD  furosemide (LASIX) 40 MG tablet Take 1 tablet (40 mg total) by mouth every other day. 04/30/19   Rhetta Mura, MD  glucose 4 GM chewable tablet Chew 4 g by mouth daily as needed for low blood sugar.     [provider]  hydrALAZINE (APRESOLINE) 50 MG tablet Take 1 tablet (50 mg total) by mouth 3 (three) times daily. 08/24/20 09/23/20  Lanae Boast, MD  insulin aspart (NOVOLOG) 100 UNIT/ML injection Inject 0-9 Units into the skin 3 (three) times daily with meals. 07/25/15   Kathlen Mody, MD  insulin glargine (LANTUS) 100 UNIT/ML injection Inject 0.3 mLs (30 Units total) into the skin daily before breakfast. 08/30/17   Zannie Cove, MD  latanoprost (XALATAN) 0.005 % ophthalmic solution Place 1 drop into both eyes at bedtime.  04/11/15   [provider]  Multiple Vitamin (MULTIVITAMIN WITH MINERALS) TABS tablet Take 1 tablet by mouth daily.    [provider]  Omega-3 1000 MG CAPS Take by mouth in the morning and at bedtime.    [provider]  sildenafil (VIAGRA) 100 MG tablet Take 100 mg by mouth as needed for erectile dysfunction.    [provider]  traZODone (DESYREL) 150 MG tablet Take 75 mg by mouth at bedtime.     [provider]     Allergies:    Allergies  Allergen Reactions  . Lisinopril-Hydrochlorothiazide Swelling    Causes swelling of lips  . Simvastatin Other (See Comments)    Reported by [facility] 04/10/16 -  unknown reaction    Social History:   Social History   Socioeconomic History  . Marital status: Married    Spouse name: Not on file  . Number of children: Not on file  . Years of education: Not on file  . Highest education level: Not on file  Occupational History  . Not on file  Tobacco Use  . Smoking status: Former Smoker    Types: Cigarettes    Quit date: 11/25/2007    Years since quitting: 13.2  . Smokeless tobacco: Never Used  Vaping Use  . Vaping Use: Never used  Substance and Sexual Activity  .  Alcohol use: Yes    Comment: Used to drink heavily - says currently 2 beers/day.  . Drug use: Yes    Types: Marijuana    Comment: reports not using cocaine  . Sexual activity: Yes    Birth control/protection: None  Other Topics Concern  . Not on file  Social History Narrative   [place].  Does not work or routinely exercise.   Social Determinants of Health   Financial Resource Strain: Not on file  Food Insecurity: Not on file  Transportation Needs: Not on file  Physical Activity: Not on file  Stress: Not on file  Social Connections: Not on file  Intimate Partner Violence: Not on file    Family History:   The patient's family history includes Heart disease in his mother.   Family Status  Relation Name Status  . Mother  Deceased  . Father  Deceased    ROS:  Please see the history of present illness.  All other ROS reviewed and negative.     Physical Exam/Data:   Vitals:   02/11/21 1655 02/11/21 1700 02/11/21 1715 02/11/21 1730  BP: (!) 149/108 (!) 152/96 (!) 129/95 (!) 142/89  Pulse: 61 65 63 62  Resp: 12 17 15 14   Temp:      TempSrc:      SpO2: 96% 96% 97% 98%  Weight:      Height:       No intake or output data in the 24 hours ending 02/11/21 1904 Last 3 Weights 02/11/2021 12/14/2020 09/03/2020  Weight (lbs) 182 lb 184 lb 12.8 oz 178 lb  Weight (kg) 82.555 kg 83.825 kg 80.74 kg     Body mass index is 25.38 kg/m.  General:  Well  nourished, well developed, in no acute distress HEENT: normal Lymph: no adenopathy Neck: no JVD Endocrine:  No thryomegaly Vascular: No carotid bruits; 4/4 extremity pulses 2+ bilaterally  Cardiac:  normal S1, S2; RRR; no murmur  Lungs:  clear to auscultation bilaterally, no wheezing, rhonchi or rales  Abd: soft, nontender, no hepatomegaly  Ext: no edema Musculoskeletal:  No deformities, BUE and BLE strength normal and equal Skin: warm and dry  Neuro:  CNs 2-12 intact, no focal abnormalities noted Psych:  Normal affect    EKG:  The ECG that was done 03/21 was personally reviewed and demonstrates SR, HR 62, 1st deg AVB w/ PR 210 ms. No change from previous.  Relevant CV Studies:  CARDIAC CATH:  08/22/20  Tubular eccentric distal 50% left main stenosis.  DFR across the left main into the ramus intermedius was 0.99  Heavy left main and proximal LAD calcification.  Patent stent in the proximal LAD.  Proximal to the stent there is 60 to 70% stenosis that was interrogated by DFR and was found to be hemodynamically insignificant at 0.93.  The ramus intermedius and circumflex were widely patent  The RCA is a very tortuous but large vessel that has tandem distal stents before the origin of the PDA.  Between the 2 stents is a focal 95 to 99% stenosis.  Successful PTCA in the distal RCA reducing a 95% stenosis to 50%.  High-pressure balloon angioplasty was not able to completely dilate the stenosis.  Because of this, a stent was not placed.  The procedure was complicated and required use of a guide catheter extender to cross the stenosis.  Normal left ventricular function.  LVEDP 18 mmHg.  EF 50%.  RECOMMENDATIONS:   It is  hoped that the angioplasty on the focal stenosis in the distal right coronary will be durable.  If symptoms recur, may need to consider Shockwave balloon angioplasty or some other method fully dilate the lesion.  Tortuosity and distal location makes atherectomy  unattractive.  Dual antiplatelet therapy with aspirin and Plavix for 6 months.     ECHO: 08/22/2020 1. Left ventricular ejection fraction, by estimation, is 50 to 55%. The  left ventricle has low normal function. The left ventricle has no regional  wall motion abnormalities. There is moderate left ventricular hypertrophy.  Left ventricular diastolic  parameters are consistent with Grade I diastolic dysfunction (impaired  relaxation).  2. Right ventricular systolic function is normal. The right ventricular  size is normal.  3. The mitral valve is normal in structure. No evidence of mitral valve  regurgitation. No evidence of mitral stenosis.  4. The aortic valve is normal in structure. Aortic valve regurgitation is  not visualized. No aortic stenosis is present.  5. The inferior vena cava is normal in size with greater than 50%  respiratory variability, suggesting right atrial pressure of 3 mmHg.   Laboratory Data:  High Sensitivity Troponin:   Recent Labs  Lab 02/11/21 1251 02/11/21 1451  TROPONINIHS 14 17      Chemistry Recent Labs  Lab 02/11/21 1251  NA 127*  K 4.2  CL 96*  CO2 20*  GLUCOSE 271*  BUN 19  CREATININE 1.55*  CALCIUM 9.3  GFRNONAA 50*  ANIONGAP 11    No results for input(s): PROT, ALBUMIN, AST, ALT, ALKPHOS, BILITOT in the last 168 hours. Hematology Recent Labs  Lab 02/11/21 1251  WBC 6.2  RBC 4.76  HGB 13.5  HCT 37.4*  MCV 78.6*  MCH 28.4  MCHC 36.1*  RDW 13.1  PLT 179   BNPNo results for input(s): BNP, PROBNP in the last 168 hours.  DDimer No results for input(s): DDIMER in the last 168 hours. Lab Results  Component Value Date   TSH 1.349 05/13/2019   Lab Results  Component Value Date   HGBA1C 7.6 (H) 08/21/2020   Lab Results  Component Value Date   CHOL 148 12/14/2020   HDL 54 12/14/2020   LDLCALC 73 12/14/2020   TRIG 116 12/14/2020   CHOLHDL 2.7 12/14/2020     Radiology/Studies:  DG Chest 2 View  Result  Date: 02/11/2021 CLINICAL DATA:  Chest pain and shortness of breath EXAM: CHEST - 2 VIEW COMPARISON:  08/21/2020 FINDINGS: The heart size and mediastinal contours are within normal limits. Subtle heterogeneous airspace opacity of the left lung base. The visualized skeletal structures are unremarkable. IMPRESSION: Subtle heterogeneous airspace opacity of the left lung base, suspicious for infection or aspiration. Electronically Signed   By: Lauralyn Primes M.D.   On: 02/11/2021 13:33     Assessment and Plan:   1. Chest pain - in setting of hypertensive urgency - cardiac ez are negative, despite > 8 hr of pain - ECG is unchanged - cath and echo 6 mo ago, at this time no need to repeat  2. HTN - Will restart home Coreg and hydralazine - add IV nitro, will help w/ CP and BP - give afternoon hydralazine now - IV hydralazine prn - extremely high BP may be the cause of his CP  Otherwise, per IM Principal Problem:   Hypertensive urgency Active Problems:   Hypertension associated with diabetes (HCC)   Hyponatremia   Insulin dependent type 2 diabetes mellitus (HCC)   CKD (  chronic kidney disease) stage 3, GFR 30-59 ml/min   COPD (chronic obstructive pulmonary disease) (HCC)   Chest pain with moderate risk for cardiac etiology   CAD (coronary artery disease)   Hyperlipidemia associated with type 2 diabetes mellitus (HCC)    For questions or updates, please contact [facility] Please consult [email or web address] for contact info under     Signed, Theodore Demark, PA-C  02/11/2021 7:04 PM   Patient seen and examined with Theodore Demark PA-C.  Agree as above, with the following exceptions and changes as noted below.  Jose [name] is a 60 year old gentleman with prior history of coronary artery disease with prior PCI to proximal LAD and distal RCA.  He had an NSTEMI in September 2021 and underwent PTCA of the distal RCA reducing stenosis to 50%.  PCI not performed due to incomplete balloon angioplasty,  and consideration was made for potential advanced interventional therapies with shockwave balloon if symptoms recur.  Atherectomy felt to be unfavorable option due to tortuosity and distal location of lesion.  Patient presents with an episode of left lateral chest discomfort similar to his prior NSTEMI and came to the hospital.  Found to be significantly hypertensive with unremarkable troponins and nonischemic ECG.  Denies use of cocaine and notes 2-3 beers consumed daily.  He endorses taking his medications faithfully including aspirin and Plavix.  Gen: NAD, CV: RRR, no murmurs, Lungs: clear, Abd: soft, Extrem: Warm, well perfused, no edema, Neuro/Psych: alert and oriented x 3, normal mood and affect. All available labs, radiology testing, previous records reviewed.  No evidence for ACS, most suspicious for hypertensive urgency given significantly elevated blood pressures.  He notes he has been taking his blood pressure medications faithfully at home, this may represent need for up titration of therapy.  Will monitor closely overnight.  We will start on a nitroglycerin infusion since this relieved his pain previously.  He does have residual disease in his coronary system which may create a set up for demand ischemia and chest pain.  We will focus on adequate blood pressure lowering.  With unremarkable troponins, no strong indication for heparin at this time.  No strong indication for repeat coronary angiography at this time either.  Will reassess tomorrow.  He is noted to be hyperglycemic and hyponatremic, with a correction that leaves him somewhat hyponatremic still.  He is on a stable dose of Lantus at home and is recently seen his primary care physician regarding his diabetes.  I think it would be best if internal medicine has an eye over his noncardiac issues to ensure no other medical stressors can be optimized for the benefit of cardiac demand.    Parke Poisson, MD 02/11/21 9:52 PM

## 2021-02-11 NOTE — ED Triage Notes (Signed)
Arrived via [facility] Onset today at home chest pain with shortness of breath. EMS report patient was also diaphoretic upon their arrival. Administered nitro 0.4mg  SL and Aspirin 324 mg prior to arrival. Chest pain intermittent 7/10 sharp. History of cardiac stent October 2021

## 2021-02-11 NOTE — ED Provider Notes (Signed)
I saw and evaluated the patient, reviewed the resident's note and I agree with the findings and plan.  Pertinent History: Cath in 9/21, had some flow back in stenosed area - noted to have a focal 99% stenosis in one area - here today with CP - known CAD - woke with CP this morning when he woke up and rolled over - felt like prior NSTEMI pain - has been radiating to the L scapula today, diaphoretic for EMS - waking up with night sweats in his sleep and nauseated today.    Pertinent Exam findings: BP was 149/108, mild relief with nitro - pulses normal - lungs clear, no murmurs, no edema, no JVD - pulse in the 60's.  I was personally present and directly supervised the following procedures:  Medical evaluation Stabilizing Care - classic symptoms for CAD but with radiation - consider dissection.     EKG Interpretation  Date/Time:  Monday February 11 2021 12:59:41 EDT Ventricular Rate:  63 PR Interval:  210 QRS Duration: 98 QT Interval:  400 QTC Calculation: 409 R Axis:   61 Text Interpretation: Sinus rhythm with 1st degree A-V block Septal infarct , age undetermined Abnormal ECG unchanged compared with prior Confirmed by Eber Hong ([id number]) on 02/11/2021 5:25:54 PM       I personally interpreted the EKG as well as the resident and agree with the interpretation on the resident's chart.  Final diagnoses:  Hyponatremia  Chest pain, unspecified type  Hyperglycemia      Eber Hong, MD 02/15/21 1752

## 2021-02-11 NOTE — H&P (Signed)
History and Physical    Jose [name] CNO:[id number] DOB: [birth year] DOA: 02/11/2021  PCP: [name]  Patient coming from: Home via EMS  I have personally briefly reviewed patient's old medical records in [facility]  Chief Complaint: Chest pain  HPI: Jose [name] is a 60 y.o. male with medical history significant for CAD with prior stenting s/p balloon angioplasty to RCA for NSTEMI 07/2020, COPD, CKD stage III, IDT2DM, HTN, HLD, and history of cocaine and alcohol abuse who presents to the ED for evaluation of chest pain.  Patient states developing left-sided chest pain this morning which woke him from sleep.  Pain is described as sharp without radiation to his neck, jaw, arms, or back.  It is intermittent last about 5-6 seconds at a time.  He has associated diaphoresis.  He did experience of shortness of breath during the episodes.  He says this felt exactly the same as when he was admitted 6 months ago with an NSTEMI.  He reports adherence to his medications.  He denies any recent cocaine use.  He reports drinking 2-3 beers per day.  He reports occasional marijuana use.  He denies any tobacco use.  Due to his pain, he called EMS.  He was given aspirin 324 mg and sublingual nitroglycerin then brought to the ED for further evaluation.  ED Course:  Initial vitals showed BP 118/84, pulse 68, RR 16, temp 97.9 F, SPO2 98% on room air. While in the ED patient became hypertensive with SBP up to 170s-180s.  Labs show sodium 127 (131 when corrected for hyperglycemia), potassium 4.2, bicarb 20, BUN 19, creatinine 1.55, serum glucose 271, WBC 6.2, hemoglobin 13.5, platelets 179,000, high-sensitivity troponin I 14 > 17, UDS positive for THC.  2 view chest x-ray shows questionable subtle opacity in the left lung base.  Cardiology were consulted and have started patient on IV nitroglycerin infusion and restarting patient's home Coreg and hydralazine.  They recommended  medical admission.  The hospitalist service was consulted to admit for further evaluation and management.  Review of Systems: All systems reviewed and are negative except as documented in history of present illness above.   Past Medical History:  Diagnosis Date  . Alcohol abuse   . CAD (coronary artery disease)    a. Reported MI in 2012 s/p 2 stents;  b. 08/2012 Cath: LM 20, LAD 20 diff ISR, jailed septal - 99%, LCX 30ost, RI large, min irregs, RCA 30p, 20-30 ISR-->Med Rx; c. 2017 Pt reports Neg stress test @ [facility].  Marland Kitchen CKD (chronic kidney disease), stage III (HCC)   . Cocaine abuse (HCC)   . COPD (chronic obstructive pulmonary disease) (HCC)   . Depression   . Diabetes mellitus without complication (HCC)   . Hyperlipidemia   . Hypertension   . Noncompliance     Past Surgical History:  Procedure Laterality Date  . CARDIAC CATHETERIZATION    . CORONARY STENT PLACEMENT    . LEFT HEART CATH AND CORONARY ANGIOGRAPHY N/A 08/22/2020   Procedure: LEFT HEART CATH AND CORONARY ANGIOGRAPHY;  Surgeon: Lyn Records, MD;  Location: [facility] INVASIVE CV LAB;  Service: Cardiovascular;  Laterality: N/A;  . LEFT HEART CATHETERIZATION WITH CORONARY ANGIOGRAM Bilateral 08/25/2012   Procedure: LEFT HEART CATHETERIZATION WITH CORONARY ANGIOGRAM;  Surgeon: Kathleene Hazel, MD;  Location: [facility] CATH LAB;  Service: Cardiovascular;  Laterality: Bilateral;    Social History:  reports that he quit smoking about 13 years ago. His smoking use included  cigarettes. He has never used smokeless tobacco. He reports current alcohol use. He reports current drug use. Drug: Marijuana.  Allergies  Allergen Reactions  . Lisinopril-Hydrochlorothiazide Swelling    Causes swelling of lips  . Simvastatin Other (See Comments)    [facility] unknown reaction    Family History  Problem Relation Age of Onset  . Heart disease Mother        [place] [id number]     Prior to Admission medications    Medication Sig Start Date End Date Taking? Authorizing Provider  albuterol (PROVENTIL HFA;VENTOLIN HFA) 108 (90 BASE) MCG/ACT inhaler Inhale 2 puffs into the lungs every 6 (six) hours as needed for wheezing or shortness of breath.     [provider]  amLODipine (NORVASC) 10 MG tablet Take 1 tablet (10 mg total) by mouth daily. 08/25/20 09/24/20  Lanae BoastKc, Ramesh, MD  aspirin EC 81 MG tablet Take 81 mg by mouth daily. Swallow whole.    [provider]  atorvastatin (LIPITOR) 40 MG tablet Take 1 tablet (40 mg total) by mouth daily at 6 PM. 04/30/19   Rhetta MuraSamtani, Jai-Gurmukh, MD  brimonidine (ALPHAGAN) 0.2 % ophthalmic solution Place 1 drop into both eyes 2 (two) times daily.  04/11/15   [provider]  budesonide-formoterol (SYMBICORT) 160-4.5 MCG/ACT inhaler Inhale 2 puffs into the lungs 2 (two) times daily.    [provider]  carvedilol (COREG) 12.5 MG tablet Take 12.5 mg by mouth 2 (two) times daily with a meal.    [provider]  clopidogrel (PLAVIX) 75 MG tablet Take 75 mg by mouth daily.    [provider]  Cyanocobalamin (VITAMIN B-12 PO) Take 1 tablet by mouth daily.     [provider]  folic acid (FOLVITE) 1 MG tablet Take 1 tablet (1 mg total) by mouth daily. 04/30/19   Rhetta MuraSamtani, Jai-Gurmukh, MD  furosemide (LASIX) 40 MG tablet Take 1 tablet (40 mg total) by mouth every other day. 04/30/19   Rhetta MuraSamtani, Jai-Gurmukh, MD  glucose 4 GM chewable tablet Chew 4 g by mouth daily as needed for low blood sugar.     [provider]  hydrALAZINE (APRESOLINE) 50 MG tablet Take 1 tablet (50 mg total) by mouth 3 (three) times daily. 08/24/20 09/23/20  Lanae BoastKc, Ramesh, MD  insulin aspart (NOVOLOG) 100 UNIT/ML injection Inject 0-9 Units into the skin 3 (three) times daily with meals. 07/25/15   Kathlen ModyAkula, Vijaya, MD  insulin glargine (LANTUS) 100 UNIT/ML injection Inject 0.3 mLs (30 Units total) into the skin daily before breakfast. 08/30/17   Zannie CoveJoseph, Preetha, MD   latanoprost (XALATAN) 0.005 % ophthalmic solution Place 1 drop into both eyes at bedtime.  04/11/15   [provider]  Multiple Vitamin (MULTIVITAMIN WITH MINERALS) TABS tablet Take 1 tablet by mouth daily.    [provider]  Omega-3 1000 MG CAPS Take by mouth in the morning and at bedtime.    [provider]  sildenafil (VIAGRA) 100 MG tablet Take 100 mg by mouth as needed for erectile dysfunction.    [provider]  traZODone (DESYREL) 150 MG tablet Take 75 mg by mouth at bedtime.     [provider]    Physical Exam: Vitals:   02/11/21 1655 02/11/21 1700 02/11/21 1715 02/11/21 1730  BP: (!) 149/108 (!) 152/96 (!) 129/95 (!) 142/89  Pulse: 61 65 63 62  Resp: 12 17 15 14   Temp:      TempSrc:  SpO2: 96% 96% 97% 98%  Weight:      Height:       Constitutional: Resting in bed, NAD, calm, comfortable Eyes: PERRL, lids and conjunctivae normal ENMT: Mucous membranes are moist. Posterior pharynx clear of any exudate or lesions.Normal dentition.  Neck: normal, supple, no masses. Respiratory: clear to auscultation bilaterally, no wheezing, no crackles. Normal respiratory effort. No accessory muscle use.  Cardiovascular: Regular rate and rhythm, no murmurs / rubs / gallops. No extremity edema. 2+ pedal pulses. Abdomen: no tenderness, no masses palpated. Bowel sounds positive.  Musculoskeletal: no clubbing / cyanosis. No joint deformity upper and lower extremities. Good ROM, no contractures. Normal muscle tone.  Skin: no rashes, lesions, ulcers. No induration Neurologic: CN 2-12 grossly intact. Sensation intact. Strength 5/5 in all 4.  Psychiatric: Normal judgment and insight. Alert and oriented x 3. Normal mood.   Labs on Admission: I have personally reviewed following labs and imaging studies  CBC: Recent Labs  Lab 02/11/21 1251  WBC 6.2  HGB 13.5  HCT 37.4*  MCV 78.6*  PLT 179   Basic Metabolic Panel: Recent Labs  Lab  02/11/21 1251  NA 127*  K 4.2  CL 96*  CO2 20*  GLUCOSE 271*  BUN 19  CREATININE 1.55*  CALCIUM 9.3   GFR: Estimated Creatinine Clearance: 52.6 mL/min (A) (by C-G formula based on SCr of 1.55 mg/dL (H)). Liver Function Tests: No results for input(s): AST, ALT, ALKPHOS, BILITOT, PROT, ALBUMIN in the last 168 hours. No results for input(s): LIPASE, AMYLASE in the last 168 hours. No results for input(s): AMMONIA in the last 168 hours. Coagulation Profile: No results for input(s): INR, PROTIME in the last 168 hours. Cardiac Enzymes: No results for input(s): CKTOTAL, CKMB, CKMBINDEX, TROPONINI in the last 168 hours. BNP (last 3 results) No results for input(s): PROBNP in the last 8760 hours. HbA1C: No results for input(s): HGBA1C in the last 72 hours. CBG: No results for input(s): GLUCAP in the last 168 hours. Lipid Profile: No results for input(s): CHOL, HDL, LDLCALC, TRIG, CHOLHDL, LDLDIRECT in the last 72 hours. Thyroid Function Tests: No results for input(s): TSH, T4TOTAL, FREET4, T3FREE, THYROIDAB in the last 72 hours. Anemia Panel: No results for input(s): VITAMINB12, FOLATE, FERRITIN, TIBC, IRON, RETICCTPCT in the last 72 hours. Urine analysis:    Component Value Date/Time   COLORURINE STRAW (A) 06/16/2020 1838   APPEARANCEUR CLEAR 06/16/2020 1838   LABSPEC 1.006 06/16/2020 1838   PHURINE 7.0 06/16/2020 1838   GLUCOSEU >=500 (A) 06/16/2020 1838   HGBUR SMALL (A) 06/16/2020 1838   BILIRUBINUR NEGATIVE 06/16/2020 1838   KETONESUR 20 (A) 06/16/2020 1838   PROTEINUR 100 (A) 06/16/2020 1838   UROBILINOGEN 1.0 07/23/2015 1403   NITRITE NEGATIVE 06/16/2020 1838   LEUKOCYTESUR NEGATIVE 06/16/2020 1838    Radiological Exams on Admission: DG Chest 2 View  Result Date: 02/11/2021 CLINICAL DATA:  Chest pain and shortness of breath EXAM: CHEST - 2 VIEW COMPARISON:  08/21/2020 FINDINGS: The heart size and mediastinal contours are within normal limits. Subtle heterogeneous  airspace opacity of the left lung base. The visualized skeletal structures are unremarkable. IMPRESSION: Subtle heterogeneous airspace opacity of the left lung base, suspicious for infection or aspiration. Electronically Signed   By: Lauralyn Primes M.D.   On: 02/11/2021 13:33    EKG: Personally reviewed. Sinus rhythm, prolonged PR interval, T wave inversion lead III, not significantly changed when compared to previous EKG.  Assessment/Plan Principal Problem:   Chest pain Active  Problems:   Hypertension associated with diabetes (HCC)   Hyponatremia   Insulin dependent type 2 diabetes mellitus (HCC)   CKD (chronic kidney disease) stage 3, GFR 30-59 ml/min   COPD (chronic obstructive pulmonary disease) (HCC)   CAD (coronary artery disease)   Hyperlipidemia associated with type 2 diabetes mellitus (HCC)   Jose [name] is a 60 y.o. male with medical history significant for CAD with prior stenting s/p balloon angioplasty to RCA for NSTEMI 07/2020, COPD, CKD stage III, IDT2DM, HTN, HLD, and history of cocaine and alcohol abuse who is admitted with chest pain.  Chest pain CAD s/p prior stenting and balloon angioplasty: Reports symptoms similar to previous NSTEMI.  EKG unchanged from prior, troponins negative x2.  Unstable angina versus related to hypertensive urgency.  Cardiology following recommend no need to repeat cardiac catheter echo at this time. -Continue home aspirin and Plavix -Started on IV nitroglycerin per cardiology -Monitor on telemetry -Continue atorvastatin and Coreg  Hypertension: Home hydralazine and Coreg resumed.  Started on IV nitroglycerin per cardiology as above.  Hyponatremia: Mild, corrected sodium is 131 on admission.  Correct hyperglycemia continue to monitor.  Type 2 diabetes with hyperglycemia: Hyperglycemic without evidence of DKA/HHS on arrival.  Start reduced home Lantus 20 units nightly, sensitive SSI, HS coverage and adjust as needed.  CKD stage  III: Chronic and stable.  Continue to monitor.  Hyperlipidemia: Continue atorvastatin.  COPD: Stable without wheezing or respiratory distress.  Continue Dulera and albuterol as needed.  History of alcohol and cocaine use: Reports drinking 2-3 beers daily.  Denies any recent cocaine use.  UDS positive for THC, negative for cocaine.  DVT prophylaxis: Lovenox Code Status: Full code, confirmed with patient Family Communication: Discussed with patient, he has discussed with his close friends Disposition Plan: From home and likely discharge to home pending chest pain rule out Consults called: Cardiology Level of care: Telemetry Cardiac Admission status:  Status is: Observation  The patient remains OBS appropriate and will d/c before 2 midnights.  Dispo: The patient is from: Home              Anticipated d/c is to: Home              Patient currently is not medically stable to d/c.   Difficult to place patient No  Darreld Mclean MD [facility]  If 7PM-7AM, please contact night-coverage [email or web address]  02/11/2021, 7:01 PM

## 2021-02-11 NOTE — ED Provider Notes (Signed)
[name] EMERGENCY DEPARTMENT Provider Note   CSN: [id number] Arrival date & time: 02/11/21  1239     History Chief Complaint  Patient presents with  . Chest Pain  . Shortness of Breath    Jose [name] is a 61 y.o. male.  Patient is a 59 year old male with a history significant for coronary artery disease last cath in September 2021 who presents with chest pain.  Patient reports that his chest pain started this morning when he woke up.  He rolled over onto his side and realized he was experiencing sharp pressure pain.  He reports that this pain is similar to the pain that he had the last time he was having a heart attack.  He reports that his pain is radiating into his upper back.  He denies any shortness of breath.  He does report some nausea.  Patient does report that for the last 2 days he was experiencing some night sweats.  He states that he does not feel sick.  Patient presents because last time he was having these chest pains he waited 4 days and his doctor told him not to do that again as he could be having a heart attack.  Patient reports compliance with his aspirin and his Plavix.  Patient has a history of hypertension and is hypertensive today.    Past Medical History:  Diagnosis Date  . Alcohol abuse   . CAD (coronary artery disease)    a. Reported MI in 2012 s/p 2 stents;  b. 08/2012 Cath: LM 20, LAD 20 diff ISR, jailed septal - 99%, LCX 30ost, RI large, min irregs, RCA 30p, 20-30 ISR-->Med Rx; c. 2017 Pt reports Neg stress test @ [facility].  Marland Kitchen CKD (chronic kidney disease), stage III (HCC)   . Cocaine abuse (HCC)   . COPD (chronic obstructive pulmonary disease) (HCC)   . Depression   . Diabetes mellitus without complication (HCC)   . Hyperlipidemia   . Hypertension   . Noncompliance     Patient Active Problem List   Diagnosis Date Noted  . NSTEMI (non-ST elevated myocardial infarction) (HCC) 08/22/2020  . Acute coronary syndrome (HCC) 08/21/2020   . Hypertensive emergency 08/21/2020  . Depression   . Elevated troponin   . Nausea and vomiting   . Suspected COVID-19 virus infection   . Diarrhea 04/27/2019  . Hypertensive retinopathy of both eyes 02/01/2019  . Chest pain 08/30/2017  . Chest pain with high risk for cardiac etiology 08/28/2017  . CKD (chronic kidney disease) stage 3, GFR 30-59 ml/min 08/28/2017  . COPD (chronic obstructive pulmonary disease) (HCC) 08/28/2017  . CAD (coronary artery disease) 04/10/2016  . Dyslipidemia 04/10/2016  . Benign prostatic hyperplasia 04/10/2016  . Diabetes (HCC) 04/10/2016  . Hyperglycemia 10/17/2015  . Uncontrolled type 2 diabetes mellitus with diabetic nephropathy, with long-term current use of insulin (HCC) 10/17/2015  . Acute renal failure superimposed on stage 3 chronic kidney disease (HCC) 10/17/2015  . Hyperkalemia 10/17/2015  . Dyslipidemia associated with type 2 diabetes mellitus (HCC) 10/17/2015  . Alcohol abuse   . Cocaine abuse (HCC) 06/24/2015  . Hyponatremia 05/12/2015  . Hypertension     Past Surgical History:  Procedure Laterality Date  . CARDIAC CATHETERIZATION    . CORONARY STENT PLACEMENT    . LEFT HEART CATH AND CORONARY ANGIOGRAPHY N/A 08/22/2020   Procedure: LEFT HEART CATH AND CORONARY ANGIOGRAPHY;  Surgeon: Lyn Records, MD;  Location: [facility] INVASIVE CV LAB;  Service: Cardiovascular;  Laterality: N/A;  . LEFT HEART CATHETERIZATION WITH CORONARY ANGIOGRAM Bilateral 08/25/2012   Procedure: LEFT HEART CATHETERIZATION WITH CORONARY ANGIOGRAM;  Surgeon: Kathleene Hazelhristopher D McAlhany, MD;  [facility];  Service: Cardiovascular;  Laterality: Bilateral;       Family History  Problem Relation Age of Onset  . Heart disease Mother        MI 2970s    Social History   Tobacco Use  . Smoking status: Former Smoker    Types: Cigarettes    Quit date: 11/25/2007    Years since quitting: 13.2  . Smokeless tobacco: Never Used  Vaping Use  . Vaping Use: Never used   Substance Use Topics  . Alcohol use: Yes    Comment: Used to drink heavily - says currently 2 beers/day.  . Drug use: Yes    Types: Marijuana    Comment: reports not using cocaine    Home Medications Prior to Admission medications   Medication Sig Start Date End Date Taking? Authorizing Provider  albuterol (PROVENTIL HFA;VENTOLIN HFA) 108 (90 BASE) MCG/ACT inhaler Inhale 2 puffs into the lungs every 6 (six) hours as needed for wheezing or shortness of breath.     [provider]  amLODipine (NORVASC) 10 MG tablet Take 1 tablet (10 mg total) by mouth daily. 08/25/20 09/24/20  Lanae BoastKc, Ramesh, MD  aspirin EC 81 MG tablet Take 81 mg by mouth daily. Swallow whole.    [provider]  atorvastatin (LIPITOR) 40 MG tablet Take 1 tablet (40 mg total) by mouth daily at 6 PM. 04/30/19   Rhetta MuraSamtani, Jai-Gurmukh, MD  brimonidine (ALPHAGAN) 0.2 % ophthalmic solution Place 1 drop into both eyes 2 (two) times daily.  04/11/15   [provider]  budesonide-formoterol (SYMBICORT) 160-4.5 MCG/ACT inhaler Inhale 2 puffs into the lungs 2 (two) times daily.    [provider]  carvedilol (COREG) 12.5 MG tablet Take 12.5 mg by mouth 2 (two) times daily with a meal.    [provider]  clopidogrel (PLAVIX) 75 MG tablet Take 75 mg by mouth daily.    [provider]  Cyanocobalamin (VITAMIN B-12 PO) Take 1 tablet by mouth daily.     [provider]  folic acid (FOLVITE) 1 MG tablet Take 1 tablet (1 mg total) by mouth daily. 04/30/19   Rhetta MuraSamtani, Jai-Gurmukh, MD  furosemide (LASIX) 40 MG tablet Take 1 tablet (40 mg total) by mouth every other day. 04/30/19   Rhetta MuraSamtani, Jai-Gurmukh, MD  glucose 4 GM chewable tablet Chew 4 g by mouth daily as needed for low blood sugar.     [provider]  hydrALAZINE (APRESOLINE) 50 MG tablet Take 1 tablet (50 mg total) by mouth 3 (three) times daily. 08/24/20 09/23/20  Lanae BoastKc, Ramesh, MD  insulin aspart (NOVOLOG) 100 UNIT/ML injection  Inject 0-9 Units into the skin 3 (three) times daily with meals. 07/25/15   Kathlen ModyAkula, Vijaya, MD  insulin glargine (LANTUS) 100 UNIT/ML injection Inject 0.3 mLs (30 Units total) into the skin daily before breakfast. 08/30/17   Zannie CoveJoseph, Preetha, MD  latanoprost (XALATAN) 0.005 % ophthalmic solution Place 1 drop into both eyes at bedtime.  04/11/15   [provider]  Multiple Vitamin (MULTIVITAMIN WITH MINERALS) TABS tablet Take 1 tablet by mouth daily.    [provider]  Omega-3 1000 MG CAPS Take by mouth in the morning and at bedtime.    [provider]  sildenafil (VIAGRA) 100 MG tablet Take 100 mg by mouth as  needed for erectile dysfunction.    [provider]  traZODone (DESYREL) 150 MG tablet Take 75 mg by mouth at bedtime.     [provider]    Allergies    Lisinopril-hydrochlorothiazide and Simvastatin  Review of Systems   Review of Systems  Constitutional: Negative for chills and fever.  HENT: Negative for ear pain and sore throat.   Eyes: Negative for pain and visual disturbance.  Respiratory: Positive for chest tightness. Negative for shortness of breath.   Cardiovascular: Positive for chest pain. Negative for leg swelling.  Gastrointestinal: Negative for abdominal pain and vomiting.  Genitourinary: Negative for dysuria and hematuria.  Musculoskeletal: Positive for back pain. Negative for arthralgias.  Skin: Negative for color change and rash.  Neurological: Negative for seizures and syncope.  All other systems reviewed and are negative.   Physical Exam Updated Vital Signs BP (!) 149/108   Pulse 61   Temp 98.4 F (36.9 C) (Oral)   Resp 12   Ht 5\' 11"  (1.803 m)   Wt 82.6 kg   SpO2 96%   BMI 25.38 kg/m   Physical Exam Vitals and nursing note reviewed.  Constitutional:      Appearance: He is well-developed.  HENT:     Head: Normocephalic and atraumatic.  Eyes:     Conjunctiva/sclera: Conjunctivae normal.  Cardiovascular:      Rate and Rhythm: Normal rate and regular rhythm.     Pulses:          Radial pulses are 2+ on the right side and 2+ on the left side.       Dorsalis pedis pulses are 2+ on the right side and 2+ on the left side.     Heart sounds: Normal heart sounds.  Pulmonary:     Effort: Pulmonary effort is normal.     Breath sounds: Normal breath sounds. No decreased breath sounds or wheezing.  Abdominal:     Palpations: Abdomen is soft.     Tenderness: There is no abdominal tenderness.  Musculoskeletal:     Cervical back: Neck supple.     Right lower leg: No tenderness. No edema.     Left lower leg: No tenderness. No edema.  Skin:    General: Skin is warm and dry.  Neurological:     General: No focal deficit present.     Mental Status: He is alert and oriented to person, place, and time.     ED Results / Procedures / Treatments   Labs (all labs ordered are listed, but only abnormal results are displayed) Labs Reviewed  BASIC METABOLIC PANEL - Abnormal; Notable for the following components:      Result Value   Sodium 127 (*)    Chloride 96 (*)    CO2 20 (*)    Glucose, Bld 271 (*)    Creatinine, Ser 1.55 (*)    GFR, Estimated 50 (*)    All other components within normal limits  CBC - Abnormal; Notable for the following components:   HCT 37.4 (*)    MCV 78.6 (*)    MCHC 36.1 (*)    All other components within normal limits  TROPONIN I (HIGH SENSITIVITY)  TROPONIN I (HIGH SENSITIVITY)    EKG None  Radiology DG Chest 2 View  Result Date: 02/11/2021 CLINICAL DATA:  Chest pain and shortness of breath EXAM: CHEST - 2 VIEW COMPARISON:  08/21/2020 FINDINGS: The heart size and mediastinal contours are within normal limits. Subtle heterogeneous airspace  opacity of the left lung base. The visualized skeletal structures are unremarkable. IMPRESSION: Subtle heterogeneous airspace opacity of the left lung base, suspicious for infection or aspiration. Electronically Signed   By: Lauralyn Primes  M.D.   On: 02/11/2021 13:33    Procedures Procedures   Medications Ordered in ED Medications  nitroGLYCERIN (NITROSTAT) SL tablet 0.4 mg (0.4 mg Sublingual Given 02/11/21 1649)    ED Course  I have reviewed the triage vital signs and the nursing notes.  Pertinent labs & imaging results that were available during my care of the patient were reviewed by me and considered in my medical decision making (see chart for details).    MDM Rules/Calculators/A&P                          60 year old male who presents with chest pain.  Patient has a history of CAD and his most recent heart cath performed in September 2021 does show significant coronary artery disease.  Patient is presenting with chest pain that he reports is similar to the last time he had a heart attack.  He was given 324 aspirin in route.  He was given sublingual nitro with some improvement of his pain.  Patient also endorsing his pain is radiating to his back.  Bilateral radial pulses intact and equal.  Bilateral DP pulses intact and equal.  No change in sensation to the extremities or numbness or tingling.  Neurovascularly intact.  Hypertensive on my evaluation however with normal blood pressure on arrival.  Additional sublingual nitro given.  EKG without ST elevations or ST depressions.  No DeWinters waves and no Wellens waves.  Notable T wave inversions in lead III, however this was previously noted on EKGs in October found in patient's chart.  Overall, no new ischemic changes.  PR interval 210 with a heart rate of 63.  Patient has previously demonstrated first-degree AV block on other EKGs as well.  Initial troponin 14.  Sodium 127.  Hyperglycemia noted.  Creatinine 1.55 which is improved from a month ago at 1.61 - appears to be around patient's baseline.  Given history of chest pain being similar to his previous NSTEMI, the fluctuating nature of the pain, the improvement with nitroglycerin, and known CAD feel that patient is  high risk for ACS. Discussed case with cardiology they will evaluate and see patient in the emergency department.  Delta troponin resulted at 17.  On reevaluation, patient continues to be hemodynamically stable.  Discussed patient with cardiologist on-call.  They feel the patient would be better served on a medicine service given his hyponatremia and his hyperglycemia.  They will see him in consult.  Patient to be admitted to the hospital service for further evaluation and management of his presenting symptoms.   Final Clinical Impression(s) / ED Diagnoses Final diagnoses:  None    Rx / DC Orders ED Discharge Orders    None       Kugler, [place], MD 02/11/21 Sable Feil    Eber Hong, MD 02/15/21 [phone]

## 2021-02-12 DIAGNOSIS — R079 Chest pain, unspecified: Secondary | ICD-10-CM | POA: Diagnosis not present

## 2021-02-12 DIAGNOSIS — I160 Hypertensive urgency: Secondary | ICD-10-CM | POA: Diagnosis not present

## 2021-02-12 LAB — HEMOGLOBIN A1C
Hgb A1c MFr Bld: 8 % — ABNORMAL HIGH (ref 4.8–5.6)
Mean Plasma Glucose: 182.9 mg/dL

## 2021-02-12 LAB — BASIC METABOLIC PANEL
Anion gap: 9 (ref 5–15)
BUN: 18 mg/dL (ref 8–23)
CO2: 23 mmol/L (ref 22–32)
Calcium: 9.4 mg/dL (ref 8.9–10.3)
Chloride: 98 mmol/L (ref 98–111)
Creatinine, Ser: 1.63 mg/dL — ABNORMAL HIGH (ref 0.61–1.24)
GFR, Estimated: 47 mL/min — ABNORMAL LOW (ref >60)
Glucose, Bld: 259 mg/dL — ABNORMAL HIGH (ref 70–99)
Potassium: 3.5 mmol/L (ref 3.5–5.1)
Sodium: 130 mmol/L — ABNORMAL LOW (ref 135–145)

## 2021-02-12 LAB — GLUCOSE, CAPILLARY
Glucose-Capillary: 204 mg/dL — ABNORMAL HIGH (ref 70–99)
Glucose-Capillary: 195 mg/dL — ABNORMAL HIGH (ref 70–99)
Glucose-Capillary: 217 mg/dL — ABNORMAL HIGH (ref 70–99)
Glucose-Capillary: 146 mg/dL — ABNORMAL HIGH (ref 70–99)

## 2021-02-12 LAB — CBC
HCT: 35.5 % — ABNORMAL LOW (ref 39.0–52.0)
Hemoglobin: 13.3 g/dL (ref 13.0–17.0)
MCH: 28.7 pg (ref 26.0–34.0)
MCHC: 37.5 g/dL — ABNORMAL HIGH (ref 30.0–36.0)
MCV: 76.5 fL — ABNORMAL LOW (ref 80.0–100.0)
Platelets: 192 10*3/uL (ref 150–400)
RBC: 4.64 MIL/uL (ref 4.22–5.81)
RDW: 13.3 % (ref 11.5–15.5)
WBC: 6.4 10*3/uL (ref 4.0–10.5)
nRBC: 0 % (ref 0.0–0.2)

## 2021-02-12 MED ORDER — POLYETHYLENE GLYCOL 3350 17 G PO PACK
17.0000 g | PACK | Freq: Every day | ORAL | Status: DC | PRN
  Administered 2021-02-12: 17 g via ORAL
  Filled 2021-02-12: qty 1

## 2021-02-12 MED ORDER — INSULIN GLARGINE 100 UNIT/ML ~~LOC~~ SOLN
20.0000 [IU] | Freq: Every day | SUBCUTANEOUS | Status: DC
  Administered 2021-02-12 – 2021-02-13 (×2): 20 [IU] via SUBCUTANEOUS
  Filled 2021-02-12 (×2): qty 0.2

## 2021-02-12 MED ORDER — ZOLPIDEM TARTRATE 5 MG PO TABS
5.0000 mg | ORAL_TABLET | Freq: Every evening | ORAL | Status: DC | PRN
  Administered 2021-02-12: 5 mg via ORAL
  Filled 2021-02-12: qty 1

## 2021-02-12 MED ORDER — CARVEDILOL 12.5 MG PO TABS
12.5000 mg | ORAL_TABLET | Freq: Two times a day (BID) | ORAL | Status: DC
  Administered 2021-02-12 – 2021-02-13 (×2): 12.5 mg via ORAL
  Filled 2021-02-12 (×2): qty 1

## 2021-02-12 MED ORDER — ISOSORBIDE MONONITRATE ER 30 MG PO TB24
30.0000 mg | ORAL_TABLET | Freq: Every day | ORAL | Status: DC
  Administered 2021-02-12 – 2021-02-13 (×2): 30 mg via ORAL
  Filled 2021-02-12 (×2): qty 1

## 2021-02-12 NOTE — Progress Notes (Signed)
Progress Note  Patient Name: Jose [name] Date of Encounter: 02/12/2021  Primary Cardiologist: Kristeen Miss, MD   Subjective   Feels well, wants to get up and walk around.   Inpatient Medications    Scheduled Meds: . aspirin EC  81 mg Oral Daily  . atorvastatin  40 mg Oral q1800  . carvedilol  12.5 mg Oral BID WC  . clopidogrel  75 mg Oral Daily  . enoxaparin (LOVENOX) injection  40 mg Subcutaneous Q24H  . hydrALAZINE  50 mg Oral TID  . insulin aspart  0-5 Units Subcutaneous QHS  . insulin aspart  0-9 Units Subcutaneous TID WC  . insulin glargine  20 Units Subcutaneous Daily  . isosorbide mononitrate  30 mg Oral Daily  . mometasone-formoterol  2 puff Inhalation BID   Continuous Infusions:  PRN Meds: acetaminophen **OR** acetaminophen, albuterol, hydrALAZINE, nitroGLYCERIN, ondansetron (ZOFRAN) IV   Vital Signs    Vitals:   02/12/21 0600 02/12/21 0615 02/12/21 0800 02/12/21 0818  BP: (!) 96/57 (!) 86/37 (!) 121/91   Pulse: (!) 58 (!) 58 84   Resp: 13 14 18    Temp:   98.4 F (36.9 C)   TempSrc:   Oral   SpO2: 97% 98% 97% 99%  Weight:      Height:        Intake/Output Summary (Last 24 hours) at 02/12/2021 1100 Last data filed at 02/12/2021 0644 Gross per 24 hour  Intake 499.49 ml  Output 202 ml  Net 297.49 ml   Filed Weights   02/11/21 1249 02/11/21 2221 02/12/21 0530  Weight: 82.6 kg 81.5 kg 81.1 kg    Telemetry    SR - Personally Reviewed  ECG    No new - Personally Reviewed  Physical Exam   GEN: No acute distress.   Neck: No JVD Cardiac: regular rhythm, normal rate, no murmurs, rubs, or gallops.  Respiratory: Clear to auscultation bilaterally. GI: Soft, nontender, non-distended  MS: No edema; No deformity. Neuro:  Nonfocal  Psych: Normal affect   Labs    Chemistry Recent Labs  Lab 02/11/21 1251 02/12/21 0428  NA 127* 130*  K 4.2 3.5  CL 96* 98  CO2 20* 23  GLUCOSE 271* 259*  BUN 19 18  CREATININE 1.55* 1.63*  CALCIUM  9.3 9.4  GFRNONAA 50* 47*  ANIONGAP 11 9     Hematology Recent Labs  Lab 02/11/21 1251 02/12/21 0428  WBC 6.2 6.4  RBC 4.76 4.64  HGB 13.5 13.3  HCT 37.4* 35.5*  MCV 78.6* 76.5*  MCH 28.4 28.7  MCHC 36.1* 37.5*  RDW 13.1 13.3  PLT 179 192    Cardiac EnzymesNo results for input(s): TROPONINI in the last 168 hours. No results for input(s): TROPIPOC in the last 168 hours.   BNPNo results for input(s): BNP, PROBNP in the last 168 hours.   DDimer No results for input(s): DDIMER in the last 168 hours.   Radiology    DG Chest 2 View  Result Date: 02/11/2021 CLINICAL DATA:  Chest pain and shortness of breath EXAM: CHEST - 2 VIEW COMPARISON:  08/21/2020 FINDINGS: The heart size and mediastinal contours are within normal limits. Subtle heterogeneous airspace opacity of the left lung base. The visualized skeletal structures are unremarkable. IMPRESSION: Subtle heterogeneous airspace opacity of the left lung base, suspicious for infection or aspiration. Electronically Signed   By: 08/23/2020 M.D.   On: 02/11/2021 13:33    Cardiac Studies   None this admission  Patient Profile     60 y.o. male with history of coronary artery disease with prior PCI to proximal LAD and distal RCA.  He had an NSTEMI in September 2021 and underwent PTCA of the distal RCA reducing stenosis to 50%.  PCI not performed due to incomplete balloon angioplasty, and consideration was made for potential advanced interventional therapies with shockwave balloon if symptoms recur.  Atherectomy felt to be unfavorable option due to tortuosity and distal location of lesion.  Patient presents with an episode of left lateral chest discomfort similar to his prior NSTEMI and came to the hospital.  Found to be significantly hypertensive with unremarkable troponins and nonischemic ECG.  Denies use of cocaine and notes 2-3 beers consumed daily.    Assessment & Plan   Principal Problem:   Hypertensive urgency Active  Problems:   Hypertension associated with diabetes (HCC)   Hyponatremia   Insulin dependent type 2 diabetes mellitus (HCC)   CKD (chronic kidney disease) stage 3, GFR 30-59 ml/min   COPD (chronic obstructive pulmonary disease) (HCC)   Chest pain with moderate risk for cardiac etiology   CAD (coronary artery disease)   Hyperlipidemia associated with type 2 diabetes mellitus (HCC)   Chest pain   HTN urgency -patient was on nitroglycerin infusion overnight and had a significant drop in blood pressure.  Fortunately he feels fine and his neurologic exam is intact.  We will stop nitro drip now.  Will initiate Imdur 30 mg daily for angina as well as increase in his antihypertensive regimen.  Ideally with his CKD and diabetes we could start ACE inhibitor or ARB, will defer this to the outpatient setting and would recommend nephrology consultation as an outpatient for CKD.  We discussed contraindication to use of sildenafil while on Imdur, he understands and will avoid use of sildenafil.  Continue the remainder of his antihypertensive regimen which includes carvedilol 12.5 mg twice daily, hydralazine 50 mg 3 times daily which he endorses taking faithfully.  He notes that his blood pressure at home is usually in the 130s and 140s.  Chest pain-no findings to suggest ACS, likely represents hypertensive urgency.  We will start Imdur, and his blood pressure should be closely monitored as an outpatient.  We will make cardiology follow-up to assist with this.  Recommended against the use of recreational substances or alcohol if at all possible.  Hyperlipidemia-improved on statin therapy, LDL is now 69 which is at goal.  Triglycerides and HDL normal.  Continue atorvastatin 40 mg daily.  From a cardiovascular perspective, patient seems stable for hospital discharge.  Remainder per primary team.     For questions or updates, please contact [facility] Please consult [email or web address] for contact info under         Signed, Parke Poisson, MD  02/12/2021, 11:00 AM

## 2021-02-12 NOTE — Plan of Care (Signed)

## 2021-02-12 NOTE — Progress Notes (Signed)
PROGRESS NOTE    Ryne Mctigue  BOF:[id number] DOB: [birth year] DOA: 02/11/2021 [facility]CP: [name]    Brief Narrative:  angioplasty to RCA for NSTEMI 07/2020, COPD, CKD stage III, IDT2DM, HTN, HLD, and history of cocaine and alcohol abuse who presents to the ED for evaluation of chest pain. Initial vitals showed BP 118/84, pulse 68, RR 16, temp 97.9 F, SPO2 98% on room air. While in the ED patient became hypertensive with SBP up to 170s-180s.Cardiology were consulted and have started patient on IV nitroglycerin infusion and restarting patient's home Coreg and hydralazine.  They recommended medical admission.  The hospitalist service was consulted to admit for further evaluation and management.  3/22-had hypotensive episodes with sbp in 80's.    Consultants:   cardiology  Procedures:   Antimicrobials:       Subjective: C/o of HA. No cp since on nitro gtt. No sob.  Objective: Vitals:   02/12/21 0545 02/12/21 0600 02/12/21 0615 02/12/21 0818  BP: 119/79 (!) 96/57 (!) 86/37   Pulse: (!) 59 (!) 58 (!) 58   Resp: 14 13 14    Temp:      TempSrc:      SpO2: 98% 97% 98% 99%  Weight:      Height:        Intake/Output Summary (Last 24 hours) at 02/12/2021 0834 Last data filed at 02/12/2021 0644 Gross per 24 hour  Intake 499.49 ml  Output 202 ml  Net 297.49 ml   Filed Weights   02/11/21 1249 02/11/21 2221 02/12/21 0530  Weight: 82.6 kg 81.5 kg 81.1 kg    Examination:  General exam: Appears calm and comfortable  Respiratory system: Clear to auscultation. Respiratory effort normal. Cardiovascular system: S1 & S2 heard, RRR. No JVD, murmurs, rubs, gallops or clicks.  Gastrointestinal system: Abdomen is nondistended, soft and nontender. No organomegaly or masses felt. Normal bowel sounds heard. Central nervous system: Alert and oriented. No focal neurological deficits. Extremitiesno edema Skin: warm, dry Psychiatry: Judgement and insight appear normal. Mood  & affect appropriate.     Data Reviewed: I have personally reviewed following labs and imaging studies  CBC: Recent Labs  Lab 02/11/21 1251 02/12/21 0428  WBC 6.2 6.4  HGB 13.5 13.3  HCT 37.4* 35.5*  MCV 78.6* 76.5*  PLT 179 192   Basic Metabolic Panel: Recent Labs  Lab 02/11/21 1251 02/12/21 0428  NA 127* 130*  K 4.2 3.5  CL 96* 98  CO2 20* 23  GLUCOSE 271* 259*  BUN 19 18  CREATININE 1.55* 1.63*  CALCIUM 9.3 9.4   GFR: Estimated Creatinine Clearance: 50 mL/min (A) (by C-G formula based on SCr of 1.63 mg/dL (H)). Liver Function Tests: No results for input(s): AST, ALT, ALKPHOS, BILITOT, PROT, ALBUMIN in the last 168 hours. No results for input(s): LIPASE, AMYLASE in the last 168 hours. No results for input(s): AMMONIA in the last 168 hours. Coagulation Profile: No results for input(s): INR, PROTIME in the last 168 hours. Cardiac Enzymes: No results for input(s): CKTOTAL, CKMB, CKMBINDEX, TROPONINI in the last 168 hours. BNP (last 3 results) No results for input(s): PROBNP in the last 8760 hours. HbA1C: Recent Labs    02/12/21 0428  HGBA1C 8.0*   CBG: Recent Labs  Lab 02/11/21 2226 02/12/21 0638  GLUCAP 198* 204*   Lipid Profile: No results for input(s): CHOL, HDL, LDLCALC, TRIG, CHOLHDL, LDLDIRECT in the last 72 hours. Thyroid Function Tests: No results for input(s): TSH, T4TOTAL, FREET4, T3FREE, THYROIDAB  in the last 72 hours. Anemia Panel: No results for input(s): VITAMINB12, FOLATE, FERRITIN, TIBC, IRON, RETICCTPCT in the last 72 hours. Sepsis Labs: No results for input(s): PROCALCITON, LATICACIDVEN in the last 168 hours.  No results found for this or any previous visit (from the past 240 hour(s)).       Radiology Studies: DG Chest 2 View  Result Date: 02/11/2021 CLINICAL DATA:  Chest pain and shortness of breath EXAM: CHEST - 2 VIEW COMPARISON:  08/21/2020 FINDINGS: The heart size and mediastinal contours are within normal limits. Subtle  heterogeneous airspace opacity of the left lung base. The visualized skeletal structures are unremarkable. IMPRESSION: Subtle heterogeneous airspace opacity of the left lung base, suspicious for infection or aspiration. Electronically Signed   By: Lauralyn Primes M.D.   On: 02/11/2021 13:33        Scheduled Meds: . aspirin EC  81 mg Oral Daily  . atorvastatin  40 mg Oral q1800  . carvedilol  12.5 mg Oral BID WC  . clopidogrel  75 mg Oral Daily  . enoxaparin (LOVENOX) injection  40 mg Subcutaneous Q24H  . hydrALAZINE  50 mg Oral TID  . insulin aspart  0-5 Units Subcutaneous QHS  . insulin aspart  0-9 Units Subcutaneous TID WC  . insulin glargine  20 Units Subcutaneous Daily  . mometasone-formoterol  2 puff Inhalation BID   Continuous Infusions: . nitroGLYCERIN 25 mcg/min (02/12/21 4166)    Assessment & Plan:   Principal Problem:   Hypertensive urgency Active Problems:   Hypertension associated with diabetes (HCC)   Hyponatremia   Insulin dependent type 2 diabetes mellitus (HCC)   CKD (chronic kidney disease) stage 3, GFR 30-59 ml/min   COPD (chronic obstructive pulmonary disease) (HCC)   Chest pain with moderate risk for cardiac etiology   CAD (coronary artery disease)   Hyperlipidemia associated with type 2 diabetes mellitus (HCC)   Chest pain   Taegan Junior [place] is a 60 y.o. male with medical history significant for CAD with prior stenting s/p balloon angioplasty to RCA for NSTEMI 07/2020, COPD, CKD stage III, IDT2DM, HTN, HLD, and history of cocaine and alcohol abuse who is admitted with chest pain.  Chest pain CAD s/p prior stenting and balloon angioplasty: Reports symptoms similar to previous NSTEMI.  EKG unchanged from prior, troponins negative x2.   Pain likely 2/2 hypertensive urgency. 3/22-cardiology consulted. Was placed  On nitro gtt with resolution of sx.  however bp dropped to sbp 80's. Was started on Imdur.  Continue asa, palvix D/c iv nitro gtt Continue  coreg with parameters Continue hydralazine with parameters Continue statin  Hypertensive urgency Improved with nitro gtt, now bp low.  Nitro gtt stopped Continue hydralazine and coreg with parameters Since bp dropped very low, will need to monitor overnight to make sure stable.  Dc iv hydralazine prn   Hyponatremia: Improved , Na 130. Continue to monitor. .  Type 2 diabetes with hyperglycemia: Hyperglycemic without evidence of DKA/HHS on arrival. 3/22-BG stable Continue on lantus Continue riss  CKD stage III: Chronic and stable.  Continue to monitor.  Hyperlipidemia: Continue atorvastatin.  COPD: Stable without wheezing or respiratory distress.  Continue Dulera and albuterol as needed.  History of alcohol and cocaine use: Reports drinking 2-3 beers daily.  Denies any recent cocaine use.  UDS positive for THC, negative for cocaine.   DVT prophylaxis: lovenox Code Status:full Family Communication: none at bedside  Status is: Observation  The patient remains OBS appropriate and will d/c  before 2 midnights.  Dispo: The patient is from: Home              Anticipated d/c is to: Home              Patient currently is not medically stable to d/c.   Difficult to place patient No   Disposition: if bp stablilizes as it dropped to sbp 80's , can dc in am         LOS: 0 days   Time spent: 35 min with >50% on coc    Lynn Ito, MD [facility] Pager [phone]  If 7PM-7AM, please contact night-coverage 02/12/2021, 8:34 AM

## 2021-02-13 DIAGNOSIS — R079 Chest pain, unspecified: Secondary | ICD-10-CM

## 2021-02-13 DIAGNOSIS — E119 Type 2 diabetes mellitus without complications: Secondary | ICD-10-CM

## 2021-02-13 DIAGNOSIS — E1169 Type 2 diabetes mellitus with other specified complication: Secondary | ICD-10-CM | POA: Diagnosis not present

## 2021-02-13 DIAGNOSIS — J410 Simple chronic bronchitis: Secondary | ICD-10-CM

## 2021-02-13 DIAGNOSIS — Z794 Long term (current) use of insulin: Secondary | ICD-10-CM | POA: Diagnosis not present

## 2021-02-13 DIAGNOSIS — I2510 Atherosclerotic heart disease of native coronary artery without angina pectoris: Secondary | ICD-10-CM

## 2021-02-13 DIAGNOSIS — E785 Hyperlipidemia, unspecified: Secondary | ICD-10-CM

## 2021-02-13 DIAGNOSIS — N1832 Chronic kidney disease, stage 3b: Secondary | ICD-10-CM | POA: Diagnosis not present

## 2021-02-13 LAB — GLUCOSE, CAPILLARY
Glucose-Capillary: 114 mg/dL — ABNORMAL HIGH (ref 70–99)
Glucose-Capillary: 170 mg/dL — ABNORMAL HIGH (ref 70–99)

## 2021-02-13 MED ORDER — ISOSORBIDE MONONITRATE ER 30 MG PO TB24
30.0000 mg | ORAL_TABLET | Freq: Every day | ORAL | 0 refills | Status: DC
Start: 2021-02-14 — End: 2021-03-12

## 2021-02-13 NOTE — Progress Notes (Signed)
D/C instructions given and reviewed. Tele and IV removed, tolerated well. Psychologist, clinical.

## 2021-02-13 NOTE — Discharge Summary (Signed)
Physician Discharge Summary  Perl Kerney ERD:[id number] DOB: [birth year] DOA: 02/11/2021  [facility]P: [name]  Admit date: 02/11/2021 Discharge date: 02/13/2021  Admitted From: Home  Disposition:  Home   Recommendations for Outpatient Follow-up and new medication changes:  1. Follow up with [facility] in 7 days.  2. Holding on amlodipine for now 3. Started on Isosorbide 4. Explained precautions when using sildenafil while taking nitrates.   Home Health: no   Equipment/Devices: na    Discharge Condition: stable  CODE STATUS: full  Diet recommendation: heart healthy diet.   Brief/Interim Summary: Jose [name] was admitted to the hospital with the working diagnosis of chest pain, to rule out acute coronary syndrome.   60 year old male past medical history for coronary artery disease, status post angioplasty (RCA), 07/2020, COPD, chronic kidney, type 2 diabetes mellitus, hypertension, dyslipidemia and history of alcohol/cocaine abuse. He reported left-sided chest pain, that woke him up from sleep, with no radiation, intermittent nature, 5-6 seconds at a time, associated with diaphoresis.  On his initial physical examination blood pressure 118/84, 149/108, heart rate 60, respiratory rate 16, temperature 97.9, oxygen saturation 98% on room air.  His lungs were clear to auscultation bilaterally, heart S1-S2, present rhythmic, abdomen soft, no lower extremity edema.  Sodium 127 (corrected for hyperglycemia 131), potassium 4.2, chloride 96, bicarb 20, glucose 271, BUN 19, creatinine 1.55, white count 6.2, hemoglobin 13.5, hematocrit 37.4, platelets 179, troponin I 14-17-17. SARS COVID-19 negative.  Chest radiograph with bibasilar atelectasis. EKG rate 63 bpm, normal axis, normal intervals, sinus rhythm, poor R wave progression, no ST segment or T wave changes.  1. Atypical chest pain, in the setting of CAD. Patient was admitted to the telemetry progressive care unit, he  has placed on nitroglycerin drip, with improvement in his chest pain.  Patient while on nitroglycerin infusion, developed hypotension that was resolved after discontinuation of the drip.   Plan to discharge home with isosorbide mononitrate 30 mg daily, continue with carvedilol and hydralazine. Start ace inhibitor when GFR more stable.  Continue dual antiplatelet therapy with asa and clopidogrel.   2. CKD stage 3a, hyponatremia .  His kidney function remained stable, creatinine 1.55-1.63. At discharge serum sodium 130 (corrected for hyperglycemia 134), potassium 3.5, chloride 98, bicarb 23, glucose 259, BUN 18, creatinine 1.6.   3. Uncontrolled HTN/ hypertensive urgency.  His blood pressure improved, continue carvedilol, hydralazine and isosorbide.   4.  Uncontrolled type 2 diabetes mellitus/dyslipidemia.  Continue home insulin regimen with basal and sliding scale. Will need further adjustments as outpatient.   5.  COPD. Continue with bronchodilator therapy and inhaled corticosteroids.   6.  History of alcohol/cocaine abuse. Toxicology screen negative for cocaine positive for THC.   Discharge Diagnoses:  Principal Problem:   Chest pain with moderate risk for cardiac etiology Active Problems:   Hyponatremia   Insulin dependent type 2 diabetes mellitus (HCC)   CKD (chronic kidney disease) stage 3, GFR 30-59 ml/min   COPD (chronic obstructive pulmonary disease) (HCC)   CAD (coronary artery disease)   Hyperlipidemia associated with type 2 diabetes mellitus (HCC)    Discharge Instructions   Allergies as of 02/13/2021      Reactions   Lisinopril-hydrochlorothiazide Swelling   Causes swelling of lips   Simvastatin Other (See Comments)   Reported by [facility] 04/10/16 - unknown reaction      Medication List    STOP taking these medications   amLODipine 10 MG tablet Commonly known  as: NORVASC     TAKE these medications   albuterol 108 (90 Base) MCG/ACT  inhaler Commonly known as: VENTOLIN HFA Inhale 2 puffs into the lungs every 6 (six) hours as needed for wheezing or shortness of breath.   aspirin EC 81 MG tablet Take 81 mg by mouth daily. Swallow whole.   atorvastatin 40 MG tablet Commonly known as: LIPITOR Take 1 tablet (40 mg total) by mouth daily at 6 PM.   brimonidine 0.2 % ophthalmic solution Commonly known as: ALPHAGAN Place 1 drop into both eyes 2 (two) times daily.   budesonide-formoterol 160-4.5 MCG/ACT inhaler Commonly known as: SYMBICORT Inhale 2 puffs into the lungs 2 (two) times daily.   carvedilol 12.5 MG tablet Commonly known as: COREG Take 12.5 mg by mouth 2 (two) times daily with a meal.   clopidogrel 75 MG tablet Commonly known as: PLAVIX Take 75 mg by mouth daily.   doxazosin 8 MG tablet Commonly known as: CARDURA Take 4 mg by mouth at bedtime.   Fluticasone-Salmeterol 500-50 MCG/DOSE Aepb Commonly known as: ADVAIR Inhale 1 puff into the lungs in the morning and at bedtime.   folic acid 1 MG tablet Commonly known as: FOLVITE Take 1 tablet (1 mg total) by mouth daily.   furosemide 20 MG tablet Commonly known as: LASIX Take 1 tablet by mouth every other day. What changed: Another medication with the same name was removed. Continue taking this medication, and follow the directions you see here.   glucose 4 GM chewable tablet Chew 4 g by mouth daily as needed for low blood sugar.   hydrALAZINE 50 MG tablet Commonly known as: APRESOLINE Take 1 tablet (50 mg total) by mouth 3 (three) times daily.   hydrOXYzine 25 MG capsule Commonly known as: VISTARIL Take 25 mg by mouth daily as needed for itching.   insulin aspart 100 UNIT/ML injection Commonly known as: novoLOG Inject 0-9 Units into the skin 3 (three) times daily with meals.   insulin glargine 100 UNIT/ML injection Commonly known as: LANTUS Inject 0.3 mLs (30 Units total) into the skin daily before breakfast.   ipratropium-albuterol  0.5-2.5 (3) MG/3ML Soln Commonly known as: DUONEB Take 3 mLs by nebulization 4 (four) times daily as needed (shortness of breath/wheezing).   isosorbide mononitrate 30 MG 24 hr tablet Commonly known as: IMDUR Take 1 tablet (30 mg total) by mouth daily. Start taking on: February 14, 2021   latanoprost 0.005 % ophthalmic solution Commonly known as: XALATAN Place 1 drop into both eyes at bedtime.   Magnesium Oxide 420 MG Tabs Take 420 mg by mouth daily.   multivitamin with minerals Tabs tablet Take 1 tablet by mouth daily.   nitroGLYCERIN 0.4 MG SL tablet Commonly known as: NITROSTAT Place 0.4 mg under the tongue every 5 (five) minutes as needed for chest pain.   Omega-3 1000 MG Caps Take by mouth in the morning and at bedtime.   polyethylene glycol powder 17 GM/SCOOP powder Commonly known as: GLYCOLAX/MIRALAX Take 17 g by mouth daily as needed for mild constipation.   sildenafil 100 MG tablet Commonly known as: VIAGRA Take 100 mg by mouth as needed for erectile dysfunction.   tamsulosin 0.4 MG Caps capsule Commonly known as: FLOMAX Take 1 capsule by mouth at bedtime.   traZODone 150 MG tablet Commonly known as: DESYREL Take 75 mg by mouth at bedtime.   VITAMIN B-12 PO Take 1 tablet by mouth daily.       Follow-up Information  [name], Deloris Ping, MD Follow up on 03/07/2021.   Specialty: Cardiology Why: 9AM. Cardiology visit Contact information: [address] [place] [address] [phone]              Allergies  Allergen Reactions  . Lisinopril-Hydrochlorothiazide Swelling    Causes swelling of lips  . Simvastatin Other (See Comments)    Reported by [facility] 04/10/16 - unknown reaction    Consultations:  Cardiology    Procedures/Studies: DG Chest 2 View  Result Date: 02/11/2021 CLINICAL DATA:  Chest pain and shortness of breath EXAM: CHEST - 2 VIEW COMPARISON:  08/21/2020 FINDINGS: The heart size and  mediastinal contours are within normal limits. Subtle heterogeneous airspace opacity of the left lung base. The visualized skeletal structures are unremarkable. IMPRESSION: Subtle heterogeneous airspace opacity of the left lung base, suspicious for infection or aspiration. Electronically Signed   By: Lauralyn Primes M.D.   On: 02/11/2021 13:33      Subjective: Patient is feeling better, no chest pain or dyspnea, no nausea or vomiting.   Discharge Exam: Vitals:   02/13/21 0400 02/13/21 0827  BP: 134/79   Pulse: 67   Resp: 14   Temp: 98.5 F (36.9 C)   SpO2: 100% 100%   Vitals:   02/12/21 2000 02/13/21 0000 02/13/21 0400 02/13/21 0827  BP: 120/83 119/76 134/79   Pulse: 62 (!) 58 67   Resp: 15 15 14    Temp: 98.7 F (37.1 C) 98.8 F (37.1 C) 98.5 F (36.9 C)   TempSrc: Oral Oral Oral   SpO2: 99% 97% 100% 100%  Weight:   81.5 kg   Height:        General: Not in pain or dyspnea.  Neurology: Awake and alert, non focal  E ENT: no pallor, no icterus, oral mucosa moist Cardiovascular: No JVD. S1-S2 present, rhythmic, no gallops, rubs, or murmurs. No lower extremity edema. Pulmonary: positive breath sounds bilaterally, adequate air movement, no wheezing, rhonchi or rales. Gastrointestinal. Abdomen soft with no organomegaly, non tender, no rebound or guarding Skin. No rashes Musculoskeletal: no joint deformities   The results of significant diagnostics from this hospitalization (including imaging, microbiology, ancillary and laboratory) are listed below for reference.     Microbiology: No results found for this or any previous visit (from the past 240 hour(s)).   Labs: BNP (last 3 results) No results for input(s): BNP in the last 8760 hours. Basic Metabolic Panel: Recent Labs  Lab 02/11/21 1251 02/12/21 0428  NA 127* 130*  K 4.2 3.5  CL 96* 98  CO2 20* 23  GLUCOSE 271* 259*  BUN 19 18  CREATININE 1.55* 1.63*  CALCIUM 9.3 9.4   Liver Function Tests: No results for  input(s): AST, ALT, ALKPHOS, BILITOT, PROT, ALBUMIN in the last 168 hours. No results for input(s): LIPASE, AMYLASE in the last 168 hours. No results for input(s): AMMONIA in the last 168 hours. CBC: Recent Labs  Lab 02/11/21 1251 02/12/21 0428  WBC 6.2 6.4  HGB 13.5 13.3  HCT 37.4* 35.5*  MCV 78.6* 76.5*  PLT 179 192   Cardiac Enzymes: No results for input(s): CKTOTAL, CKMB, CKMBINDEX, TROPONINI in the last 168 hours. BNP: Invalid input(s): POCBNP CBG: Recent Labs  Lab 02/12/21 0638 02/12/21 1146 02/12/21 1751 02/12/21 2123 02/13/21 0609  GLUCAP 204* 195* 217* 146* 114*   D-Dimer No results for input(s): DDIMER in the last 72 hours. Hgb A1c Recent Labs    02/12/21 0428  HGBA1C 8.0*   Lipid Profile No results for input(s): CHOL, HDL, LDLCALC, TRIG, CHOLHDL, LDLDIRECT in the last 72 hours. Thyroid function studies No results for input(s): TSH, T4TOTAL, T3FREE, THYROIDAB in the last 72 hours.  Invalid input(s): FREET3 Anemia work up No results for input(s): VITAMINB12, FOLATE, FERRITIN, TIBC, IRON, RETICCTPCT in the last 72 hours. Urinalysis    Component Value Date/Time   COLORURINE STRAW (A) 06/16/2020 1838   APPEARANCEUR CLEAR 06/16/2020 1838   LABSPEC 1.006 06/16/2020 1838   PHURINE 7.0 06/16/2020 1838   GLUCOSEU >=500 (A) 06/16/2020 1838   HGBUR SMALL (A) 06/16/2020 1838   BILIRUBINUR NEGATIVE 06/16/2020 1838   KETONESUR 20 (A) 06/16/2020 1838   PROTEINUR 100 (A) 06/16/2020 1838   UROBILINOGEN 1.0 07/23/2015 1403   NITRITE NEGATIVE 06/16/2020 1838   LEUKOCYTESUR NEGATIVE 06/16/2020 1838   Sepsis Labs Invalid input(s): PROCALCITONIN,  WBC,  LACTICIDVEN Microbiology No results found for this or any previous visit (from the past 240 hour(s)).   Time coordinating discharge: 45 minutes  SIGNED:   Coralie KeensMauricio Daniel Brailyn Killion, MD  [facility] 02/13/2021, 9:41 AM

## 2021-02-13 NOTE — Plan of Care (Signed)

## 2021-03-12 ENCOUNTER — Other Ambulatory Visit: Payer: Self-pay

## 2021-03-12 ENCOUNTER — Encounter: Payer: Self-pay | Admitting: Cardiovascular Disease

## 2021-03-12 ENCOUNTER — Ambulatory Visit (INDEPENDENT_AMBULATORY_CARE_PROVIDER_SITE_OTHER): Payer: No Typology Code available for payment source | Admitting: Cardiovascular Disease

## 2021-03-12 VITALS — BP 142/84 | HR 64 | Ht 71.0 in | Wt 184.0 lb

## 2021-03-12 DIAGNOSIS — I2510 Atherosclerotic heart disease of native coronary artery without angina pectoris: Secondary | ICD-10-CM

## 2021-03-12 DIAGNOSIS — E1159 Type 2 diabetes mellitus with other circulatory complications: Secondary | ICD-10-CM | POA: Diagnosis not present

## 2021-03-12 DIAGNOSIS — I152 Hypertension secondary to endocrine disorders: Secondary | ICD-10-CM | POA: Diagnosis not present

## 2021-03-12 MED ORDER — AMLODIPINE BESYLATE 10 MG PO TABS: 10.0000 mg | ORAL_TABLET | Freq: Every day | ORAL | 3 refills | Status: AC

## 2021-03-12 MED ORDER — HYDRALAZINE HCL 100 MG PO TABS: 100.0000 mg | ORAL_TABLET | Freq: Three times a day (TID) | ORAL | 11 refills | Status: DC

## 2021-03-12 NOTE — Progress Notes (Signed)
Cardiology Office Note:    Date:  03/12/2021   ID:  Jose [name], DOB Nov 09, 1961, MRN [id number]  [facility]  [facility]  Cardiologist:  Allena KatzNishan , Haizlee Henton  Electrophysiologist:  None   Referring MD: [facility]   Chief Complaint  Patient presents with  . Hypertension  . Coronary Artery Disease     June 10, 2019    Pt placed on my schedule by mistake.   Has been seen by Dr. Eden EmmsNishan originally  and Dr. Rennis GoldenHilty, Dr. Katrinka BlazingSmith  in June, 2020 .  I last saw him in 2013.   Jose [name] is a 60 y.o. male with a hx of cocaine abuse,  HTN .  He was recently admitted to the hospital with episodes of chest pain.  He had very low troponin level.  He was noted to have hypertension.  He is UDS was positive for cocaine.  Has cut his alcohol intake .  Was admitted with hyponatremia   ( was drinking lots of beer prior to admission )   September 08, 2019:  Jose [name] is seen today for follow-up visit.  He has a history of hypertension, cocaine abuse, alcohol abuse.  Not getting any exercise but is walking .  Has generalized fatigue  Echo from June, 2020 shows normal LV function , grade 1 diastolic dysfunction  BP is elevated today .  Only took amlodipine this am   Still eats liver pudding in the am.  Avoiding salt for the most part   He stopped his cardura because it caused dizziness.   Jan. 21, 2022 Jose [name] is seen today for follow up of his HTN, cocaine abuse and ETOH abuse He was admitted to the hospital in September, 2021.  He had angioplasty  to the distal right coronary artery.   No further episodes of CP .  Not getting much exercise  Walks some  His BP at home look good  Avoids salt   March 12, 2021: [facility] Ruled out for Mi Was started on Imdur.   Said it made him shake,  BP was 110 range,  Made him dizzy He stopped imdur and restarted the amlodipine .  Has cut out his salt ,   Did not sleep last night  - which may explain  his mildly HTN today  Getting some exercise , 3 times a week  Is not working , Is on disability  Has not been using cocaine recently     Past Medical History:  Diagnosis Date  . Alcohol abuse   . CAD (coronary artery disease)    a. Reported MI in 2012 s/p 2 stents;  b. 08/2012 Cath: LM 20, LAD 20 diff ISR, jailed septal - 99%, LCX 30ost, RI large, min irregs, RCA 30p, 20-30 ISR-->Med Rx; c. 2017 [facility].  Marland Kitchen. CKD (chronic kidney disease), stage III (HCC)   . Cocaine abuse (HCC)   . COPD (chronic obstructive pulmonary disease) (HCC)   . Depression   . Diabetes mellitus without complication (HCC)   . Hyperlipidemia   . Hypertension   . Hypertensive urgency 02/11/2021  . Noncompliance     Past Surgical History:  Procedure Laterality Date  . CARDIAC CATHETERIZATION    . CORONARY STENT PLACEMENT    . LEFT HEART CATH AND CORONARY ANGIOGRAPHY N/A 08/22/2020   Procedure: LEFT HEART CATH AND CORONARY ANGIOGRAPHY;  Surgeon: Lyn RecordsSmith, Henry W, MD;  [facility]  INVASIVE CV LAB;  Service: Cardiovascular;  Laterality: N/A;  . LEFT HEART CATHETERIZATION WITH CORONARY ANGIOGRAM Bilateral 08/25/2012   Procedure: LEFT HEART CATHETERIZATION WITH CORONARY ANGIOGRAM;  Surgeon: Kathleene Hazel, MD;  Location: [facility] CATH LAB;  Service: Cardiovascular;  Laterality: Bilateral;    Current Medications: Current Meds  Medication Sig  . albuterol (PROVENTIL HFA;VENTOLIN HFA) 108 (90 BASE) MCG/ACT inhaler Inhale 2 puffs into the lungs every 6 (six) hours as needed for wheezing or shortness of breath.   Marland Kitchen amLODipine (NORVASC) 10 MG tablet Take 1 tablet (10 mg total) by mouth daily.  Marland Kitchen aspirin EC 81 MG tablet Take 81 mg by mouth daily. Swallow whole.  Marland Kitchen atorvastatin (LIPITOR) 40 MG tablet Take 1 tablet (40 mg total) by mouth daily at 6 PM.  . brimonidine (ALPHAGAN) 0.2 % ophthalmic solution Place 1 drop into both eyes 2 (two) times daily.   . budesonide-formoterol (SYMBICORT) 160-4.5  MCG/ACT inhaler Inhale 2 puffs into the lungs 2 (two) times daily.  . carvedilol (COREG) 12.5 MG tablet Take 12.5 mg by mouth 2 (two) times daily with a meal.  . carvedilol (COREG) 6.25 MG tablet TAKE 1 TABLET (6.25 MG TOTAL) BY MOUTH TWO TIMES DAILY WITH A MEAL.  Marland Kitchen clopidogrel (PLAVIX) 75 MG tablet TAKE 1 TABLET (75 MG TOTAL) BY MOUTH DAILY WITH BREAKFAST.  Marland Kitchen Cyanocobalamin (VITAMIN B-12 PO) Take 1 tablet by mouth daily.   Marland Kitchen doxazosin (CARDURA) 8 MG tablet Take 4 mg by mouth at bedtime.  . Fluticasone-Salmeterol (ADVAIR) 500-50 MCG/DOSE AEPB Inhale 1 puff into the lungs in the morning and at bedtime.  . folic acid (FOLVITE) 1 MG tablet Take 1 tablet (1 mg total) by mouth daily.  . furosemide (LASIX) 20 MG tablet Take 1 tablet by mouth every other day.  Marland Kitchen glucose 4 GM chewable tablet Chew 4 g by mouth daily as needed for low blood sugar.   . hydrALAZINE (APRESOLINE) 100 MG tablet Take 1 tablet (100 mg total) by mouth 3 (three) times daily.  . hydrOXYzine (VISTARIL) 25 MG capsule Take 25 mg by mouth daily as needed for itching.  . insulin aspart (NOVOLOG) 100 UNIT/ML injection Inject 0-9 Units into the skin 3 (three) times daily with meals.  . insulin glargine (LANTUS) 100 UNIT/ML injection Inject 0.3 mLs (30 Units total) into the skin daily before breakfast.  . ipratropium-albuterol (DUONEB) 0.5-2.5 (3) MG/3ML SOLN Take 3 mLs by nebulization 4 (four) times daily as needed (shortness of breath/wheezing).  Marland Kitchen latanoprost (XALATAN) 0.005 % ophthalmic solution Place 1 drop into both eyes at bedtime.   . Magnesium Oxide 420 MG TABS Take 420 mg by mouth daily.  . Multiple Vitamin (MULTIVITAMIN WITH MINERALS) TABS tablet Take 1 tablet by mouth daily.  . nitroGLYCERIN (NITROSTAT) 0.4 MG SL tablet Place 0.4 mg under the tongue every 5 (five) minutes as needed for chest pain.  . Omega-3 1000 MG CAPS Take by mouth in the morning and at bedtime.  . polyethylene glycol powder (GLYCOLAX/MIRALAX) 17 GM/SCOOP  powder Take 17 g by mouth daily as needed for mild constipation.  . sildenafil (VIAGRA) 100 MG tablet Take 100 mg by mouth as needed for erectile dysfunction.  . tamsulosin (FLOMAX) 0.4 MG CAPS capsule Take 1 capsule by mouth at bedtime.  . traZODone (DESYREL) 150 MG tablet Take 75 mg by mouth at bedtime.   . [DISCONTINUED] amLODipine (NORVASC) 10 MG tablet TAKE 1 TABLET (10 MG TOTAL) BY MOUTH DAILY.  . [DISCONTINUED] hydrALAZINE (APRESOLINE) 50  MG tablet TAKE 1 TABLET (50 MG TOTAL) BY MOUTH THREE TIMES DAILY.  . [DISCONTINUED] isosorbide mononitrate (IMDUR) 30 MG 24 hr tablet Take 1 tablet (30 mg total) by mouth daily.     Allergies:   Lisinopril-hydrochlorothiazide and Simvastatin   Social History   Socioeconomic History  . Marital status: Married    Spouse name: Not on file  . Number of children: Not on file  . Years of education: Not on file  . Highest education level: Not on file  Occupational History  . Not on file  Tobacco Use  . Smoking status: Former Smoker    Types: Cigarettes    Quit date: 11/25/2007    Years since quitting: 13.3  . Smokeless tobacco: Never Used  Vaping Use  . Vaping Use: Never used  Substance and Sexual Activity  . Alcohol use: Yes    Comment: Used to drink heavily - says currently 2 beers/day.  . Drug use: Yes    Types: Marijuana    Comment: reports not using cocaine  . Sexual activity: Yes    Birth control/protection: None  Other Topics Concern  . Not on file  Social History Narrative   Lives in [place] by himself.  Does not work or routinely exercise.   Social Determinants of Health   Financial Resource Strain: Not on file  Food Insecurity: Not on file  Transportation Needs: Not on file  Physical Activity: Not on file  Stress: Not on file  Social Connections: Not on file     Family History: The patient's family history includes Heart disease in his mother.  ROS:   Please see the history of present illness.     All other systems  reviewed and are negative.  EKGs/Labs/Other Studies Reviewed:    The following studies were reviewed today:   EKG:    Recent Labs: 08/21/2020: Magnesium 2.3 12/14/2020: ALT 19 02/12/2021: BUN 18; Creatinine, Ser 1.63; Hemoglobin 13.3; Platelets 192; Potassium 3.5; Sodium 130  Recent Lipid Panel    Component Value Date/Time   CHOL 148 12/14/2020 0849   TRIG 116 12/14/2020 0849   HDL 54 12/14/2020 0849   CHOLHDL 2.7 12/14/2020 0849   CHOLHDL 3.3 08/22/2020 0535   VLDL 9 08/22/2020 0535   LDLCALC 73 12/14/2020 0849    Physical Exam: Blood pressure (!) 142/84, pulse 64, height 5\' 11"  (1.803 m), weight 184 lb (83.5 kg), SpO2 99 %.  GEN:   Middle age male,  NAD  HEENT: Normal NECK: No JVD; No carotid bruits LYMPHATICS: No lymphadenopathy CARDIAC: RRR   RESPIRATORY:  Clear to auscultation without rales, wheezing or rhonchi  ABDOMEN: Soft, non-tender, non-distended MUSCULOSKELETAL:  No edema; No deformity  SKIN: Warm and dry NEUROLOGIC:  Alert and oriented x 3      ASSESSMENT:    No diagnosis found. PLAN:     1.  CAD  :    S/p stenting in the past.  .  Had some atypical chest pain about a month ago and was admitted to the hospital.  He ruled out for myocardial infarction. He was placed on isosorbide but this caused him to be hypotensive and he did not tolerate.  He stopped the isosorbide and restarted the amlodipine on his own. At this point we will go ahead and discontinue the isosorbide and continue amlodipine.  2, HTN:       His blood pressure is much better although it is still mildly elevated.  We will increase the hydralazine to  100 mg 3 times a day.  He will follow-up with his doctor at the [place].  We will see an APP in 6 months.  3.  Cocaine:    .  His urinary drug screen was negative for cocaine during this last admission.  4.  Hyperlipidemia: Continue current medications.      Medication Adjustments/Labs and Tests Ordered: Current medicines are reviewed at  length with the patient today.  Concerns regarding medicines are outlined above.  No orders of the defined types were placed in this encounter.  Meds ordered this encounter  Medications  . hydrALAZINE (APRESOLINE) 100 MG tablet    Sig: Take 1 tablet (100 mg total) by mouth 3 (three) times daily.    Dispense:  90 tablet    Refill:  11  . amLODipine (NORVASC) 10 MG tablet    Sig: Take 1 tablet (10 mg total) by mouth daily.    Dispense:  90 tablet    Refill:  3     Patient Instructions  Medication Instructions:  Your physician has recommended you make the following change in your medication:   STOP Imdur RESTART amlodipine 10mg  daily INCREASE Hydralazine to 100mg  three times a day *If you need a refill on your cardiac medications before your next appointment, please call your pharmacy*   Lab Work: none If you have labs (blood work) drawn today and your tests are completely normal, you will receive your results only by: MyChart Message (if you have MyChart) OR . A paper copy in the mail If you have any lab test that is abnormal or we need to change your treatment, we will call you to review the results.   Testing/Procedures: none   Follow-Up: At [facility], you and your health needs are our priority.  As part of our continuing mission to provide you with exceptional heart care, we have created designated Provider Care Teams.  These Care Teams include your primary Cardiologist (physician) and Advanced Practice Providers (APPs -  Physician Assistants and Nurse Practitioners) who all work together to provide you with the care you need, when you need it.   Your next appointment:   6 month(s)  The format for your next appointment:   In Person  Provider:   You will see one of the following Advanced Practice Providers on your designated Care Team:    Marland Kitchen, PA-C  [facility], Tereso Newcomer       Signed, Chelsea Aus, MD  03/12/2021 9:34 AM    [facility]

## 2021-03-12 NOTE — Patient Instructions (Signed)
Medication Instructions:  Your physician has recommended you make the following change in your medication:   STOP Imdur RESTART amlodipine 10mg  daily INCREASE Hydralazine to 100mg  three times a day *If you need a refill on your cardiac medications before your next appointment, please call your pharmacy*   Lab Work: none If you have labs (blood work) drawn today and your tests are completely normal, you will receive your results only by: MyChart Message (if you have MyChart) OR . A paper copy in the mail If you have any lab test that is abnormal or we need to change your treatment, we will call you to review the results.   Testing/Procedures: none   Follow-Up: At [facility], you and your health needs are our priority.  As part of our continuing mission to provide you with exceptional heart care, we have created designated Provider Care Teams.  These Care Teams include your primary Cardiologist (physician) and Advanced Practice Providers (APPs -  Physician Assistants and Nurse Practitioners) who all work together to provide you with the care you need, when you need it.   Your next appointment:   6 month(s)  The format for your next appointment:   In Person  Provider:   You will see one of the following Advanced Practice Providers on your designated Care Team:    Marland Kitchen, PA-C  [facility], Tereso Newcomer

## 2021-04-02 DIAGNOSIS — R6889 Other general symptoms and signs: Secondary | ICD-10-CM | POA: Diagnosis not present

## 2021-05-05 ENCOUNTER — Encounter (HOSPITAL_COMMUNITY): Payer: Self-pay | Admitting: Pharmacy Technician

## 2021-05-05 ENCOUNTER — Other Ambulatory Visit: Payer: Self-pay

## 2021-05-05 ENCOUNTER — Emergency Department (HOSPITAL_COMMUNITY): Payer: No Typology Code available for payment source

## 2021-05-05 ENCOUNTER — Emergency Department (HOSPITAL_COMMUNITY)
Admission: EM | Admit: 2021-05-05 | Discharge: 2021-05-05 | Disposition: A | Discharge status: Home / Self Care | Payer: No Typology Code available for payment source | Attending: Emergency Medicine | Admitting: Emergency Medicine

## 2021-05-05 DIAGNOSIS — E1122 Type 2 diabetes mellitus with diabetic chronic kidney disease: Secondary | ICD-10-CM | POA: Diagnosis not present

## 2021-05-05 DIAGNOSIS — M25559 Pain in unspecified hip: Secondary | ICD-10-CM | POA: Diagnosis not present

## 2021-05-05 DIAGNOSIS — J441 Chronic obstructive pulmonary disease with (acute) exacerbation: Secondary | ICD-10-CM | POA: Insufficient documentation

## 2021-05-05 DIAGNOSIS — Z7982 Long term (current) use of aspirin: Secondary | ICD-10-CM | POA: Insufficient documentation

## 2021-05-05 DIAGNOSIS — Z79899 Other long term (current) drug therapy: Secondary | ICD-10-CM | POA: Insufficient documentation

## 2021-05-05 DIAGNOSIS — M5441 Lumbago with sciatica, right side: Secondary | ICD-10-CM | POA: Diagnosis present

## 2021-05-05 DIAGNOSIS — Z794 Long term (current) use of insulin: Secondary | ICD-10-CM | POA: Diagnosis not present

## 2021-05-05 DIAGNOSIS — N183 Chronic kidney disease, stage 3 unspecified: Secondary | ICD-10-CM | POA: Insufficient documentation

## 2021-05-05 DIAGNOSIS — Z87891 Personal history of nicotine dependence: Secondary | ICD-10-CM | POA: Insufficient documentation

## 2021-05-05 DIAGNOSIS — Z7902 Long term (current) use of antithrombotics/antiplatelets: Secondary | ICD-10-CM | POA: Insufficient documentation

## 2021-05-05 DIAGNOSIS — I2510 Atherosclerotic heart disease of native coronary artery without angina pectoris: Secondary | ICD-10-CM | POA: Diagnosis not present

## 2021-05-05 DIAGNOSIS — Z8616 Personal history of COVID-19: Secondary | ICD-10-CM | POA: Insufficient documentation

## 2021-05-05 MED ORDER — METHOCARBAMOL 500 MG PO TABS: 500.0000 mg | ORAL_TABLET | Freq: Two times a day (BID) | ORAL | 0 refills | Status: DC

## 2021-05-05 NOTE — ED Provider Notes (Signed)
Emergency Medicine Provider Triage Evaluation Note  [facility] Jose [name] , a 60 y.o. male  was evaluated in triage.  Pt complains of right low back pain x 3 weeks. Began while he was at gym doing leg press.  Better with tylenol. Worse with movement.  Review of Systems  Positive: Right low back pain  Negative: Dysuria hematuria abdominal pain    Physical Exam  BP (!) 156/88 (BP Location: Right Arm)   Pulse 71   Temp 98 F (36.7 C) (Oral)   Resp 18   SpO2 98%  Gen:   Awake, no distress   Resp:  Normal effort  MSK:   Moves extremities without difficulty. No reproducible lumbar tenderness Other:  No rash over back  Medical Decision Making  Medically screening exam initiated at 9:33 AM.  Appropriate orders placed.     Patient made aware this encounter is a triage and screening encounter and no beds are immediately available at this time in the ER.  Patient was informed that the remainder of the evaluation will be completed by another provider.  Patient made aware triage orders have been placed and patient will be placed in the waiting room while work up is initiated and until a room becomes available. Patient encouraged to await a formal ER encounter with a clinician.  Patient made aware that exiting the department prior to formal encounter with an ER clinician and completion of the work-up is considered leaving against medical advice.  At that time there is no guarantee that there are no emergency medical conditions present and patient assumes risks of leaving including worsening condition, permanent disability and death. Patient verbalizes understanding.     Liberty Handy, PA-C 05/05/21 [id number]    Margarita Grizzle, MD 05/05/21 [phone]

## 2021-05-05 NOTE — ED Triage Notes (Signed)
Pt here via POV with reports of R sided hip/back pain after working out at the gym 3 weeks ago. Pt denies urinary symptoms. Pt took tylenol pta with some relief.

## 2021-05-05 NOTE — Discharge Instructions (Addendum)
Discontinue Flexeril and take Robaxin. Please recheck with your doctors at the [place] this week

## 2021-05-05 NOTE — ED Provider Notes (Signed)
[facility] Provider Note   CSN: [id number] Arrival date & time: 05/05/21  [id number]     History Chief Complaint  Patient presents with   Hip Pain    Jose [name] is a 60 y.o. male.  HPI 60 year old male history of coronary artery disease, CKD, COPD, diabetes, hyperlipidemia, hypertension, presents today complaining of low back pain.  Patient states about 3 weeks ago he began having some low back pain while he was on a leg press machine.  He has had no direct trauma to the area.  The pain radiates to the right.  [facility] been [facility] reports being placed on skeletal muscle relaxants.  He continues to have pain.  The pain radiates down the right side of the leg to the right lower leg.  He denies any weakness, numbness, tingling, loss of bowel or bladder control.  Pain is moderate to severe.  Pain is worse in certain positions.  Denies any recent IV drug use, fever, chills, or history of abscesses    Past Medical History:  Diagnosis Date   Alcohol abuse    CAD (coronary artery disease)    a. Reported MI in 2012 s/p 2 stents;  b. 08/2012 Cath: LM 20, LAD 20 diff ISR, jailed septal - 99%, LCX 30ost, RI large, min irregs, RCA 30p, 20-30 ISR-->Med Rx; c. 2017 [facility]   CKD (chronic kidney disease), stage III (HCC)    Cocaine abuse (HCC)    COPD (chronic obstructive pulmonary disease) (HCC)    Depression    Diabetes mellitus without complication (HCC)    Hyperlipidemia    Hypertension    Hypertensive urgency 02/11/2021   Noncompliance     Patient Active Problem List   Diagnosis Date Noted   Hypertensive urgency 02/11/2021   Chest pain 02/11/2021   NSTEMI (non-ST elevated myocardial infarction) (HCC) 08/22/2020   Acute coronary syndrome (HCC) 08/21/2020   Hypertensive emergency 08/21/2020   Depression    Elevated troponin    Nausea and vomiting    Suspected COVID-19 virus infection    Diarrhea  04/27/2019   Hypertensive retinopathy of both eyes 02/01/2019   Chest pain with moderate risk for cardiac etiology 08/30/2017   Chest pain with high risk for cardiac etiology 08/28/2017   CKD (chronic kidney disease) stage 3, GFR 30-59 ml/min 08/28/2017   COPD (chronic obstructive pulmonary disease) (HCC) 08/28/2017   CAD (coronary artery disease) 04/10/2016   Hyperlipidemia associated with type 2 diabetes mellitus (HCC) 04/10/2016   Benign prostatic hyperplasia 04/10/2016   Diabetes (HCC) 04/10/2016   Hyperglycemia 10/17/2015   Insulin dependent type 2 diabetes mellitus (HCC) 10/17/2015   Acute renal failure superimposed on stage 3 chronic kidney disease (HCC) 10/17/2015   Hyperkalemia 10/17/2015   Dyslipidemia associated with type 2 diabetes mellitus (HCC) 10/17/2015   Alcohol abuse    Cocaine abuse (HCC) 06/24/2015   Hyponatremia 05/12/2015   Hypertension associated with diabetes [facility](HCC)     Past Surgical History:  Procedure Laterality Date   CARDIAC CATHETERIZATION     CORONARY STENT PLACEMENT     LEFT HEART CATH AND CORONARY ANGIOGRAPHY N/A 08/22/2020   Procedure: LEFT HEART CATH AND CORONARY ANGIOGRAPHY;  Surgeon: Lyn RecordsSmith, Henry W, MD;  [facility] INV[facility] CV LAB;  Service: Cardiovascular;  Laterality: N/A;   LEFT HEART CATHETERIZATION WITH CORONARY ANGIOGRAM Bilateral 08/25/2012   Procedure: LEFT HEART CATHETERIZATION WITH CORONARY ANGIOGRAM;  Surgeon: Nile Dearhristopher D  Clifton James, MD;  Location: [facility] CATH LAB;  Service: Cardiovascular;  Laterality: Bilateral;       Family History  Problem Relation Age of Onset   Heart disease Mother        MI 72s    Social History   Tobacco Use   Smoking status: Former    Pack years: 0.00    Types: Cigarettes    Quit date: 11/25/2007    Years since quitting: 13.4   Smokeless tobacco: Never  Vaping Use   Vaping Use: Never used  Substance Use Topics   Alcohol use: Yes    Comment: Used to drink heavily - says currently 2 beers/day.   Drug  use: Yes    Types: Marijuana    Comment: reports not using cocaine    Home Medications Prior to Admission medications   Medication Sig Start Date End Date Taking? Authorizing Provider  albuterol (PROVENTIL HFA;VENTOLIN HFA) 108 (90 BASE) MCG/ACT inhaler Inhale 2 puffs into the lungs every 6 (six) hours as needed for wheezing or shortness of breath.     [provider]  amLODipine (NORVASC) 10 MG tablet Take 1 tablet (10 mg total) by mouth daily. 03/12/21   [name], Deloris Ping, MD  aspirin EC 81 MG tablet Take 81 mg by mouth daily. Swallow whole.    [provider]  atorvastatin (LIPITOR) 40 MG tablet Take 1 tablet (40 mg total) by mouth daily at 6 PM. 04/30/19   Rhetta Mura, MD  brimonidine (ALPHAGAN) 0.2 % ophthalmic solution Place 1 drop into both eyes 2 (two) times daily.  04/11/15   [provider]  budesonide-formoterol (SYMBICORT) 160-4.5 MCG/ACT inhaler Inhale 2 puffs into the lungs 2 (two) times daily.    [provider]  carvedilol (COREG) 12.5 MG tablet Take 12.5 mg by mouth 2 (two) times daily with a meal.    [provider]  carvedilol (COREG) 6.25 MG tablet TAKE 1 TABLET (6.25 MG TOTAL) BY MOUTH TWO TIMES DAILY WITH A MEAL. 08/24/20 08/24/21  Lanae Boast, MD  clopidogrel (PLAVIX) 75 MG tablet TAKE 1 TABLET (75 MG TOTAL) BY MOUTH DAILY WITH BREAKFAST. 08/24/20 08/24/21  Lanae Boast, MD  Cyanocobalamin (VITAMIN B-12 PO) Take 1 tablet by mouth daily.     [provider]  doxazosin (CARDURA) 8 MG tablet Take 4 mg by mouth at bedtime. 04/15/20   [provider]  Fluticasone-Salmeterol (ADVAIR) 500-50 MCG/DOSE AEPB Inhale 1 puff into the lungs in the morning and at bedtime. 12/18/20   [provider]  folic acid (FOLVITE) 1 MG tablet Take 1 tablet (1 mg total) by mouth daily. 04/30/19   Rhetta Mura, MD  furosemide (LASIX) 20 MG tablet Take 1 tablet by mouth every other day. 09/21/20   [provider]   glucose 4 GM chewable tablet Chew 4 g by mouth daily as needed for low blood sugar.     [provider]  hydrALAZINE (APRESOLINE) 100 MG tablet Take 1 tablet (100 mg total) by mouth 3 (three) times daily. 03/12/21   [name], Deloris Ping, MD  hydrOXYzine (VISTARIL) 25 MG capsule Take 25 mg by mouth daily as needed for itching. 06/20/20   [provider]  insulin aspart (NOVOLOG) 100 UNIT/ML injection Inject 0-9 Units into the skin 3 (three) times daily with meals. 07/25/15   Kathlen Mody, MD  insulin glargine (LANTUS) 100 UNIT/ML injection Inject 0.3 mLs (30 Units total) into the skin daily before breakfast. 08/30/17   Zannie Cove, MD  ipratropium-albuterol (DUONEB) 0.5-2.5 (3) MG/3ML SOLN Take 3 mLs by nebulization 4 (four) times daily as needed (shortness of breath/wheezing). 08/07/20   [provider]  latanoprost (XALATAN) 0.005 % ophthalmic solution Place 1 drop into both eyes at bedtime.  04/11/15   [provider]  Magnesium Oxide 420 MG TABS Take 420 mg by mouth daily. 08/22/20   [provider]  Multiple Vitamin (MULTIVITAMIN WITH MINERALS) TABS tablet Take 1 tablet by mouth daily.    [provider]  nitroGLYCERIN (NITROSTAT) 0.4 MG SL tablet Place 0.4 mg under the tongue every 5 (five) minutes as needed for chest pain. 09/21/20   [provider]  Omega-3 1000 MG CAPS Take by mouth in the morning and at bedtime.    [provider]  polyethylene glycol powder (GLYCOLAX/MIRALAX) 17 GM/SCOOP powder Take 17 g by mouth daily as needed for mild constipation. 12/18/20   [provider]  sildenafil (VIAGRA) 100 MG tablet Take 100 mg by mouth as needed for erectile dysfunction.    [provider]  tamsulosin (FLOMAX) 0.4 MG CAPS capsule Take 1 capsule by mouth at bedtime. 01/21/21   [provider]  traZODone (DESYREL) 150 MG tablet Take 75 mg by mouth at bedtime.     [provider]    Allergies     Lisinopril-hydrochlorothiazide and Simvastatin  Review of Systems   Review of Systems  All other systems reviewed and are negative.  Physical Exam Updated Vital Signs BP (!) 156/88 (BP Location: Right Arm)   Pulse 71   Temp 98 F (36.7 C) (Oral)   Resp 18   SpO2 98%   Physical Exam Vitals and nursing note reviewed.  Constitutional:      Appearance: Normal appearance. He is well-developed.  HENT:     Head: Normocephalic and atraumatic.     Right Ear: External ear normal.     Left Ear: External ear normal.     Nose: Nose normal.  Eyes:     Extraocular Movements: Extraocular movements intact.  Neck:     Trachea: No tracheal deviation.  Cardiovascular:     Rate and Rhythm: Normal rate and regular rhythm.  Pulmonary:     Effort: Pulmonary effort is normal.  Abdominal:     General: Abdomen is flat.     Palpations: Abdomen is soft.  Musculoskeletal:        General: Normal range of motion.     Cervical back: Normal range of motion and neck supple.     Comments: Visual examination of the low back reveals no obvious external signs of trauma There is no point tenderness to palpation over the lumbar spine or the paraspinal region Pulses are intact distal to injury Sensation is intact Full active range of motion of right lower extremity noted  Skin:    General: Skin is warm and dry.     Capillary Refill: Capillary refill takes less than 2 seconds.  Neurological:     General: No focal deficit present.     Mental Status: He is alert and oriented to person, place, and time.     Comments: Strength is 5 out of 5 at bilateral hip flexors, hip extensors, knee extensors, knee flexors, ankle dorsi and plantar flexion is 5 out of 5 as is great toe Sensation is intact Perineal sensation is intact   Psychiatric:        Mood and Affect: Mood normal.        Behavior: Behavior normal.  ED Results / Procedures / Treatments   Labs (all labs ordered are listed, but only abnormal  results are displayed) Labs Reviewed - No data to display  EKG None  Radiology DG Lumbar Spine Complete  Result Date: 05/05/2021 CLINICAL DATA:  Right low back pain since injury at gym 3 weeks ago EXAM: LUMBAR SPINE - COMPLETE 4+ VIEW COMPARISON:  02/10/2016 FINDINGS: Rudimentary S1-2 disc space. Generalized disc narrowing and endplate degeneration with relative sparing at L4-5. No notable facet spurring. 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 No evidence of fracture or bone lesion. IMPRESSION: No acute finding. 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 Electronically Signed   By: Marnee Spring M.D.   On: 05/05/2021 10:17    Procedures Procedures   Medications Ordered in ED Medications - No data to display  ED Course  I have reviewed the triage vital signs and the nursing notes.  Pertinent labs & imaging results that were available during my care of the patient were reviewed by me and considered in my medical decision making (see chart for details).    MDM Rules/Calculators/A&P                          60 year old man with multiple health problems who presents today complaining of low back pain with a traumatic mechanism.  Patient has normal neurological exam.  No red flags.  X-rays obtained and normal.  He has been on skeletal muscle relaxants.  This gust return precautions and need for follow-up and patient voices understanding. Final Clinical Impression(s) / ED Diagnoses Final diagnoses:  Acute right-sided low back pain with right-sided sciatica    Rx / DC Orders ED Discharge Orders     None        Margarita Grizzle, MD 05/05/21 1040

## 2021-05-21 DIAGNOSIS — R6889 Other general symptoms and signs: Secondary | ICD-10-CM | POA: Diagnosis not present

## 2021-05-28 ENCOUNTER — Other Ambulatory Visit: Payer: Self-pay

## 2021-05-28 ENCOUNTER — Emergency Department (HOSPITAL_COMMUNITY)
Admission: EM | Admit: 2021-05-28 | Discharge: 2021-05-28 | Disposition: A | Discharge status: Home / Self Care | Payer: No Typology Code available for payment source | Attending: Emergency Medicine | Admitting: Emergency Medicine

## 2021-05-28 ENCOUNTER — Encounter (HOSPITAL_COMMUNITY): Payer: Self-pay | Admitting: *Deleted

## 2021-05-28 DIAGNOSIS — M5440 Lumbago with sciatica, unspecified side: Secondary | ICD-10-CM | POA: Diagnosis not present

## 2021-05-28 DIAGNOSIS — Z87891 Personal history of nicotine dependence: Secondary | ICD-10-CM | POA: Diagnosis not present

## 2021-05-28 DIAGNOSIS — N183 Chronic kidney disease, stage 3 unspecified: Secondary | ICD-10-CM | POA: Diagnosis not present

## 2021-05-28 DIAGNOSIS — I129 Hypertensive chronic kidney disease with stage 1 through stage 4 chronic kidney disease, or unspecified chronic kidney disease: Secondary | ICD-10-CM | POA: Insufficient documentation

## 2021-05-28 DIAGNOSIS — Z7952 Long term (current) use of systemic steroids: Secondary | ICD-10-CM | POA: Diagnosis not present

## 2021-05-28 DIAGNOSIS — M549 Dorsalgia, unspecified: Secondary | ICD-10-CM | POA: Diagnosis present

## 2021-05-28 DIAGNOSIS — Z79899 Other long term (current) drug therapy: Secondary | ICD-10-CM | POA: Diagnosis not present

## 2021-05-28 DIAGNOSIS — M5430 Sciatica, unspecified side: Secondary | ICD-10-CM | POA: Diagnosis not present

## 2021-05-28 DIAGNOSIS — Z7982 Long term (current) use of aspirin: Secondary | ICD-10-CM | POA: Insufficient documentation

## 2021-05-28 DIAGNOSIS — J449 Chronic obstructive pulmonary disease, unspecified: Secondary | ICD-10-CM | POA: Insufficient documentation

## 2021-05-28 DIAGNOSIS — E1122 Type 2 diabetes mellitus with diabetic chronic kidney disease: Secondary | ICD-10-CM | POA: Diagnosis not present

## 2021-05-28 DIAGNOSIS — Z7902 Long term (current) use of antithrombotics/antiplatelets: Secondary | ICD-10-CM | POA: Insufficient documentation

## 2021-05-28 DIAGNOSIS — I2510 Atherosclerotic heart disease of native coronary artery without angina pectoris: Secondary | ICD-10-CM | POA: Diagnosis not present

## 2021-05-28 DIAGNOSIS — Z794 Long term (current) use of insulin: Secondary | ICD-10-CM | POA: Diagnosis not present

## 2021-05-28 MED ORDER — KETOROLAC TROMETHAMINE 30 MG/ML IJ SOLN
30.0000 mg | Freq: Once | INTRAMUSCULAR | Status: AC
  Administered 2021-05-28: 30 mg via INTRAMUSCULAR
  Filled 2021-05-28: qty 1

## 2021-05-28 MED ORDER — METHOCARBAMOL 500 MG PO TABS: 500.0000 mg | ORAL_TABLET | Freq: Two times a day (BID) | ORAL | 0 refills | Status: DC

## 2021-05-28 MED ORDER — HYDROCODONE-ACETAMINOPHEN 5-325 MG PO TABS: 1.0000 | ORAL_TABLET | ORAL | 0 refills | Status: DC | PRN

## 2021-05-28 NOTE — ED Notes (Signed)
Pt was able to demonstrate by standing to show me the movements that hurts his lower back. Pt stated the first time he experienced the pain was three weeks ago at the gym on exercise equipment. The pain now he was sitting while drinking a beer and the back pain came all of the sudden. The pain radiates down to his left leg. Pt stated he took Tylenol around 13:00 with some relief.

## 2021-05-28 NOTE — ED Provider Notes (Signed)
[facility]   CSN: [id number] Arrival date & time: 05/28/21  1244     History Chief Complaint  Patient presents with   Back Pain    Jose [name] is a 60 y.o. male.  Pt presents to the ED today with back pain.  Pt said he had back pain a few weeks ago while at the gym.  He came to the ED and was d/c on Robaxin.  The pain is back and he said it is worse.  He did take tylenol pta and pain is better.  Pt said pain radiates down his left leg.  He is able to ambulate.  No bowel or bladder problems.      Past Medical History:  Diagnosis Date   Alcohol abuse    CAD (coronary artery disease)    a. Reported MI in 2012 s/p 2 stents;  b. 08/2012 Cath: LM 20, LAD 20 diff ISR, jailed septal - 99%, LCX 30ost, RI large, min irregs, RCA 30p, 20-30 ISR-->Med Rx; c. 2017 [facility]   CKD (chronic kidney disease), stage III (HCC)    Cocaine abuse (HCC)    COPD (chronic obstructive pulmonary disease) (HCC)    Depression    Diabetes mellitus without complication (HCC)    Hyperlipidemia    Hypertension    Hypertensive urgency 02/11/2021   Noncompliance     Patient Active Problem List   Diagnosis Date Noted   Hypertensive urgency 02/11/2021   Chest pain 02/11/2021   NSTEMI (non-ST elevated myocardial infarction) (HCC) 08/22/2020   Acute coronary syndrome (HCC) 08/21/2020   Hypertensive emergency 08/21/2020   Depression    Elevated troponin    Nausea and vomiting    Suspected COVID-19 virus infection    Diarrhea 04/27/2019   Hypertensive retinopathy of both eyes 02/01/2019   Chest pain with moderate risk for cardiac etiology 08/30/2017   Chest pain with high risk for cardiac etiology 08/28/2017   CKD (chronic kidney disease) stage 3, GFR 30-59 ml/min 08/28/2017   COPD (chronic obstructive pulmonary disease) (HCC) 08/28/2017   CAD (coronary artery disease) 04/10/2016   Hyperlipidemia associated with type 2  diabetes mellitus (HCC) 04/10/2016   Benign prostatic hyperplasia 04/10/2016   Diabetes (HCC) 04/10/2016   Hyperglycemia 10/17/2015   Insulin dependent type 2 diabetes mellitus (HCC) 10/17/2015   Acute renal failure superimposed on stage 3 chronic kidney disease (HCC) 10/17/2015   Hyperkalemia 10/17/2015   Dyslipidemia associated with type 2 diabetes mellitus (HCC) 10/17/2015   Alcohol abuse    Cocaine abuse (HCC) 06/24/2015   Hyponatremia 05/12/2015   Hypertension associated with diabetes [facility](HCC)     Past Surgical History:  Procedure Laterality Date   CARDIAC CATHETERIZATION     CORONARY STENT PLACEMENT     LEFT HEART CATH AND CORONARY ANGIOGRAPHY N/A 08/22/2020   Procedure: LEFT HEART CATH AND CORONARY ANGIOGRAPHY;  Surgeon: Lyn RecordsSmith, Henry W, MD;  Location: [facility] INVASIVE CV LAB;  Service: Cardiovascular;  Laterality: N/A;   LEFT HEART CATHETERIZATION WITH CORONARY ANGIOGRAM Bilateral 08/25/2012   Procedure: LEFT HEART CATHETERIZATION WITH CORONARY ANGIOGRAM;  Surgeon: Kathleene Hazelhristopher D McAlhany, MD;  [facility];  Service: Cardiovascular;  Laterality: Bilateral;       Family History  Problem Relation Age of Onset   Heart disease Mother        MI 2370s    Social History   Tobacco Use   Smoking status: Former  Pack years: 0.00    Types: Cigarettes    Quit date: 11/25/2007    Years since quitting: 13.5   Smokeless tobacco: Never  Vaping Use   Vaping Use: Never used  Substance Use Topics   Alcohol use: Yes    Comment: Used to drink heavily - says currently 2 beers/day.   Drug use: Yes    Types: Marijuana    Comment: reports not using cocaine    Home Medications Prior to Admission medications   Medication Sig Start Date End Date Taking? Authorizing Provider  HYDROcodone-acetaminophen (NORCO/VICODIN) 5-325 MG tablet Take 1 tablet by mouth every 4 (four) hours as needed. 05/28/21  Yes Jacalyn Lefevre, MD  methocarbamol (ROBAXIN) 500 MG tablet Take 1 tablet (500 mg total)  by mouth 2 (two) times daily. 05/28/21  Yes Jacalyn Lefevre, MD  albuterol (PROVENTIL HFA;VENTOLIN HFA) 108 (90 BASE) MCG/ACT inhaler Inhale 2 puffs into the lungs every 6 (six) hours as needed for wheezing or shortness of breath.     [provider]  amLODipine (NORVASC) 10 MG tablet Take 1 tablet (10 mg total) by mouth daily. 03/12/21   [name], Deloris Ping, MD  aspirin EC 81 MG tablet Take 81 mg by mouth daily. Swallow whole.    [provider]  atorvastatin (LIPITOR) 40 MG tablet Take 1 tablet (40 mg total) by mouth daily at 6 PM. 04/30/19   Rhetta Mura, MD  brimonidine (ALPHAGAN) 0.2 % ophthalmic solution Place 1 drop into both eyes 2 (two) times daily.  04/11/15   [provider]  budesonide-formoterol (SYMBICORT) 160-4.5 MCG/ACT inhaler Inhale 2 puffs into the lungs 2 (two) times daily.    [provider]  carvedilol (COREG) 12.5 MG tablet Take 12.5 mg by mouth 2 (two) times daily with a meal.    [provider]  carvedilol (COREG) 6.25 MG tablet TAKE 1 TABLET (6.25 MG TOTAL) BY MOUTH TWO TIMES DAILY WITH A MEAL. 08/24/20 08/24/21  Lanae Boast, MD  clopidogrel (PLAVIX) 75 MG tablet TAKE 1 TABLET (75 MG TOTAL) BY MOUTH DAILY WITH BREAKFAST. 08/24/20 08/24/21  Lanae Boast, MD  Cyanocobalamin (VITAMIN B-12 PO) Take 1 tablet by mouth daily.     [provider]  doxazosin (CARDURA) 8 MG tablet Take 4 mg by mouth at bedtime. 04/15/20   [provider]  Fluticasone-Salmeterol (ADVAIR) 500-50 MCG/DOSE AEPB Inhale 1 puff into the lungs in the morning and at bedtime. 12/18/20   [provider]  folic acid (FOLVITE) 1 MG tablet Take 1 tablet (1 mg total) by mouth daily. 04/30/19   Rhetta Mura, MD  furosemide (LASIX) 20 MG tablet Take 1 tablet by mouth every other day. 09/21/20   [provider]  glucose 4 GM chewable tablet Chew 4 g by mouth daily as needed for low blood sugar.     [provider]  hydrALAZINE  (APRESOLINE) 100 MG tablet Take 1 tablet (100 mg total) by mouth 3 (three) times daily. 03/12/21   [name], Deloris Ping, MD  hydrOXYzine (VISTARIL) 25 MG capsule Take 25 mg by mouth daily as needed for itching. 06/20/20   [provider]  insulin aspart (NOVOLOG) 100 UNIT/ML injection Inject 0-9 Units into the skin 3 (three) times daily with meals. 07/25/15   Kathlen Mody, MD  insulin glargine (LANTUS) 100 UNIT/ML injection Inject 0.3 mLs (30 Units total) into the skin daily before breakfast. 08/30/17   Zannie Cove, MD  ipratropium-albuterol (DUONEB) 0.5-2.5 (3) MG/3ML SOLN Take 3 mLs by  nebulization 4 (four) times daily as needed (shortness of breath/wheezing). 08/07/20   [provider]  latanoprost (XALATAN) 0.005 % ophthalmic solution Place 1 drop into both eyes at bedtime.  04/11/15   [provider]  Magnesium Oxide 420 MG TABS Take 420 mg by mouth daily. 08/22/20   [provider]  Multiple Vitamin (MULTIVITAMIN WITH MINERALS) TABS tablet Take 1 tablet by mouth daily.    [provider]  nitroGLYCERIN (NITROSTAT) 0.4 MG SL tablet Place 0.4 mg under the tongue every 5 (five) minutes as needed for chest pain. 09/21/20   [provider]  Omega-3 1000 MG CAPS Take by mouth in the morning and at bedtime.    [provider]  polyethylene glycol powder (GLYCOLAX/MIRALAX) 17 GM/SCOOP powder Take 17 g by mouth daily as needed for mild constipation. 12/18/20   [provider]  sildenafil (VIAGRA) 100 MG tablet Take 100 mg by mouth as needed for erectile dysfunction.    [provider]  tamsulosin (FLOMAX) 0.4 MG CAPS capsule Take 1 capsule by mouth at bedtime. 01/21/21   [provider]  traZODone (DESYREL) 150 MG tablet Take 75 mg by mouth at bedtime.     [provider]    Allergies    Lisinopril-hydrochlorothiazide and Simvastatin  Review of Systems   Review of Systems  Musculoskeletal:  Positive for back  pain.  All other systems reviewed and are negative.  Physical Exam Updated Vital Signs BP 134/84   Pulse 60   Temp 98.2 F (36.8 C) (Oral)   Resp 14   SpO2 98%   Physical Exam Vitals and nursing note reviewed.  Constitutional:      Appearance: Normal appearance.  HENT:     Head: Normocephalic and atraumatic.     Right Ear: External ear normal.     Left Ear: External ear normal.     Nose: Nose normal.     Mouth/Throat:     Mouth: Mucous membranes are moist.     Pharynx: Oropharynx is clear.  Eyes:     Extraocular Movements: Extraocular movements intact.     Conjunctiva/sclera: Conjunctivae normal.     Pupils: Pupils are equal, round, and reactive to light.  Cardiovascular:     Rate and Rhythm: Normal rate and regular rhythm.     Pulses: Normal pulses.     Heart sounds: Normal heart sounds.  Pulmonary:     Effort: Pulmonary effort is normal.     Breath sounds: Normal breath sounds.  Abdominal:     General: Abdomen is flat. Bowel sounds are normal.     Palpations: Abdomen is soft.  Musculoskeletal:        General: Normal range of motion.     Cervical back: Normal range of motion and neck supple.  Skin:    General: Skin is warm.     Capillary Refill: Capillary refill takes less than 2 seconds.  Neurological:     General: No focal deficit present.     Mental Status: He is alert and oriented to person, place, and time.  Psychiatric:        Mood and Affect: Mood normal.        Behavior: Behavior normal.        Thought Content: Thought content normal.        Judgment: Judgment normal.    ED Results / Procedures / Treatments   Labs (all labs ordered are listed, but only abnormal results are displayed) Labs Reviewed -  No data to display  EKG None  Radiology No results found.  Procedures Procedures   Medications Ordered in ED Medications  ketorolac (TORADOL) 30 MG/ML injection 30 mg (has no administration in time range)    ED Course  I have reviewed the  triage vital signs and the nursing notes.  Pertinent labs & imaging results that were available during my care of the patient were reviewed by me and considered in my medical decision making (see chart for details).    MDM Rules/Calculators/A&P                          Pt given a dose of toradol.  Pt is d/c home with instructions to f/u with the [place].  Return if worse. Final Clinical Impression(s) / ED Diagnoses Final diagnoses:  Acute sciatica    Rx / DC Orders ED Discharge Orders          Ordered    HYDROcodone-acetaminophen (NORCO/VICODIN) 5-325 MG tablet  Every 4 hours PRN        05/28/21 1630    methocarbamol (ROBAXIN) 500 MG tablet  2 times daily        05/28/21 1630             Jacalyn Lefevre, MD 05/28/21 [phone]

## 2021-05-28 NOTE — ED Triage Notes (Signed)
Pt reports hx of back pain, was seen 6/12 for same. On Sunday had sudden increase in lower back pain and radiates down left leg with numbness/tingling. Ambulatory at triage.

## 2021-05-28 NOTE — ED Provider Notes (Signed)
Emergency Medicine Provider Triage Evaluation Note  [facility] Oquendo , a 60 y.o. male  was evaluated in triage.  Pt complains of ongoing back pain.  Last evaluated here last month.  Pain now has increased in nature, with numbness and tingling.  Ambulatory with steady gait.  No red flags.  Review of Systems  Positive: Back pain Negative: Fever, urinary complaints.  Physical Exam  BP 134/84   Pulse 60   Temp 98.2 F (36.8 C) (Oral)   Resp 14   SpO2 98%  Gen:   Awake, no distress   Resp:  Normal effort  MSK:   Moves extremities without difficulty  Other:    Medical Decision Making  Medically screening exam initiated at 2:05 PM.  Appropriate orders placed.  Jari Favre Junior Monnier was informed that the remainder of the evaluation will be completed by another provider, this initial triage assessment does not replace that evaluation, and the importance of remaining in the ED until their evaluation is complete.     Claude Manges, PA-C 05/28/21 1407    Milagros Loll, MD 05/29/21 [phone]

## 2021-07-09 DIAGNOSIS — R6889 Other general symptoms and signs: Secondary | ICD-10-CM | POA: Diagnosis not present

## 2021-07-22 DIAGNOSIS — R6889 Other general symptoms and signs: Secondary | ICD-10-CM | POA: Diagnosis not present

## 2021-09-03 DIAGNOSIS — R6889 Other general symptoms and signs: Secondary | ICD-10-CM | POA: Diagnosis not present

## 2022-01-27 ENCOUNTER — Encounter (HOSPITAL_COMMUNITY): Payer: Self-pay

## 2022-01-27 ENCOUNTER — Inpatient Hospital Stay (HOSPITAL_COMMUNITY)
Admission: EM | Admit: 2022-01-27 | Discharge: 2022-01-29 | DRG: 286 | Disposition: A | Discharge status: Home / Self Care | Payer: No Typology Code available for payment source | Attending: Cardiology | Admitting: Cardiology

## 2022-01-27 ENCOUNTER — Other Ambulatory Visit: Payer: Self-pay

## 2022-01-27 ENCOUNTER — Emergency Department (HOSPITAL_COMMUNITY): Payer: No Typology Code available for payment source

## 2022-01-27 DIAGNOSIS — I200 Unstable angina: Secondary | ICD-10-CM | POA: Diagnosis present

## 2022-01-27 DIAGNOSIS — Z20822 Contact with and (suspected) exposure to covid-19: Secondary | ICD-10-CM | POA: Diagnosis present

## 2022-01-27 DIAGNOSIS — F1010 Alcohol abuse, uncomplicated: Secondary | ICD-10-CM | POA: Diagnosis present

## 2022-01-27 DIAGNOSIS — Z79899 Other long term (current) drug therapy: Secondary | ICD-10-CM

## 2022-01-27 DIAGNOSIS — I25110 Atherosclerotic heart disease of native coronary artery with unstable angina pectoris: Secondary | ICD-10-CM | POA: Diagnosis present

## 2022-01-27 DIAGNOSIS — E871 Hypo-osmolality and hyponatremia: Secondary | ICD-10-CM | POA: Diagnosis present

## 2022-01-27 DIAGNOSIS — E1122 Type 2 diabetes mellitus with diabetic chronic kidney disease: Secondary | ICD-10-CM | POA: Diagnosis present

## 2022-01-27 DIAGNOSIS — E785 Hyperlipidemia, unspecified: Secondary | ICD-10-CM | POA: Diagnosis present

## 2022-01-27 DIAGNOSIS — Z87891 Personal history of nicotine dependence: Secondary | ICD-10-CM

## 2022-01-27 DIAGNOSIS — E1165 Type 2 diabetes mellitus with hyperglycemia: Secondary | ICD-10-CM | POA: Diagnosis present

## 2022-01-27 DIAGNOSIS — N179 Acute kidney failure, unspecified: Secondary | ICD-10-CM | POA: Diagnosis present

## 2022-01-27 DIAGNOSIS — Z955 Presence of coronary angioplasty implant and graft: Secondary | ICD-10-CM

## 2022-01-27 DIAGNOSIS — J449 Chronic obstructive pulmonary disease, unspecified: Secondary | ICD-10-CM | POA: Diagnosis present

## 2022-01-27 DIAGNOSIS — E876 Hypokalemia: Secondary | ICD-10-CM | POA: Diagnosis present

## 2022-01-27 DIAGNOSIS — N183 Chronic kidney disease, stage 3 unspecified: Secondary | ICD-10-CM | POA: Diagnosis present

## 2022-01-27 DIAGNOSIS — R001 Bradycardia, unspecified: Secondary | ICD-10-CM

## 2022-01-27 DIAGNOSIS — Z888 Allergy status to other drugs, medicaments and biological substances status: Secondary | ICD-10-CM | POA: Diagnosis not present

## 2022-01-27 DIAGNOSIS — Z7951 Long term (current) use of inhaled steroids: Secondary | ICD-10-CM | POA: Diagnosis not present

## 2022-01-27 DIAGNOSIS — N1831 Chronic kidney disease, stage 3a: Secondary | ICD-10-CM | POA: Diagnosis present

## 2022-01-27 DIAGNOSIS — I129 Hypertensive chronic kidney disease with stage 1 through stage 4 chronic kidney disease, or unspecified chronic kidney disease: Secondary | ICD-10-CM | POA: Diagnosis present

## 2022-01-27 DIAGNOSIS — E118 Type 2 diabetes mellitus with unspecified complications: Secondary | ICD-10-CM

## 2022-01-27 DIAGNOSIS — N1832 Chronic kidney disease, stage 3b: Secondary | ICD-10-CM | POA: Diagnosis present

## 2022-01-27 DIAGNOSIS — R079 Chest pain, unspecified: Secondary | ICD-10-CM | POA: Diagnosis not present

## 2022-01-27 DIAGNOSIS — I10 Essential (primary) hypertension: Secondary | ICD-10-CM

## 2022-01-27 DIAGNOSIS — Z7982 Long term (current) use of aspirin: Secondary | ICD-10-CM

## 2022-01-27 DIAGNOSIS — F1410 Cocaine abuse, uncomplicated: Secondary | ICD-10-CM | POA: Diagnosis present

## 2022-01-27 DIAGNOSIS — I252 Old myocardial infarction: Secondary | ICD-10-CM | POA: Diagnosis not present

## 2022-01-27 DIAGNOSIS — E7800 Pure hypercholesterolemia, unspecified: Secondary | ICD-10-CM

## 2022-01-27 DIAGNOSIS — Q239 Congenital malformation of aortic and mitral valves, unspecified: Secondary | ICD-10-CM

## 2022-01-27 DIAGNOSIS — E1159 Type 2 diabetes mellitus with other circulatory complications: Secondary | ICD-10-CM

## 2022-01-27 DIAGNOSIS — Z7984 Long term (current) use of oral hypoglycemic drugs: Secondary | ICD-10-CM | POA: Diagnosis not present

## 2022-01-27 DIAGNOSIS — K8591 Acute pancreatitis with uninfected necrosis, unspecified: Secondary | ICD-10-CM | POA: Diagnosis present

## 2022-01-27 DIAGNOSIS — Z794 Long term (current) use of insulin: Secondary | ICD-10-CM | POA: Diagnosis not present

## 2022-01-27 DIAGNOSIS — I2583 Coronary atherosclerosis due to lipid rich plaque: Secondary | ICD-10-CM | POA: Diagnosis not present

## 2022-01-27 DIAGNOSIS — I152 Hypertension secondary to endocrine disorders: Secondary | ICD-10-CM

## 2022-01-27 DIAGNOSIS — I2510 Atherosclerotic heart disease of native coronary artery without angina pectoris: Secondary | ICD-10-CM | POA: Diagnosis present

## 2022-01-27 DIAGNOSIS — Z8249 Family history of ischemic heart disease and other diseases of the circulatory system: Secondary | ICD-10-CM

## 2022-01-27 DIAGNOSIS — I25119 Atherosclerotic heart disease of native coronary artery with unspecified angina pectoris: Secondary | ICD-10-CM | POA: Diagnosis not present

## 2022-01-27 LAB — CBC
HCT: 39.1 % (ref 39.0–52.0)
Hemoglobin: 13.8 g/dL (ref 13.0–17.0)
MCH: 27.8 pg (ref 26.0–34.0)
MCHC: 35.3 g/dL (ref 30.0–36.0)
MCV: 78.7 fL — ABNORMAL LOW (ref 80.0–100.0)
Platelets: 213 10*3/uL (ref 150–400)
RBC: 4.97 MIL/uL (ref 4.22–5.81)
RDW: 13.8 % (ref 11.5–15.5)
WBC: 8.2 10*3/uL (ref 4.0–10.5)
nRBC: 0 % (ref 0.0–0.2)

## 2022-01-27 LAB — BASIC METABOLIC PANEL
Anion gap: 10 (ref 5–15)
BUN: 23 mg/dL (ref 8–23)
CO2: 21 mmol/L — ABNORMAL LOW (ref 22–32)
Calcium: 9.4 mg/dL (ref 8.9–10.3)
Chloride: 99 mmol/L (ref 98–111)
Creatinine, Ser: 1.84 mg/dL — ABNORMAL HIGH (ref 0.61–1.24)
GFR, Estimated: 41 mL/min — ABNORMAL LOW (ref >60)
Glucose, Bld: 233 mg/dL — ABNORMAL HIGH (ref 70–99)
Potassium: 4 mmol/L (ref 3.5–5.1)
Sodium: 130 mmol/L — ABNORMAL LOW (ref 135–145)

## 2022-01-27 LAB — GLUCOSE, CAPILLARY
Glucose-Capillary: 177 mg/dL — ABNORMAL HIGH (ref 70–99)
Glucose-Capillary: 188 mg/dL — ABNORMAL HIGH (ref 70–99)

## 2022-01-27 LAB — RESP PANEL BY RT-PCR (FLU A&B, COVID) ARPGX2
Influenza A by PCR: NEGATIVE
Influenza B by PCR: NEGATIVE
SARS Coronavirus 2 by RT PCR: NEGATIVE

## 2022-01-27 LAB — TROPONIN I (HIGH SENSITIVITY)
Troponin I (High Sensitivity): 18 ng/L — ABNORMAL HIGH (ref <18)
Troponin I (High Sensitivity): 19 ng/L — ABNORMAL HIGH (ref <18)

## 2022-01-27 LAB — MAGNESIUM: Magnesium: 1.7 mg/dL (ref 1.7–2.4)

## 2022-01-27 LAB — HIV ANTIBODY (ROUTINE TESTING W REFLEX): HIV Screen 4th Generation wRfx: NONREACTIVE

## 2022-01-27 LAB — PROTIME-INR
INR: 1.1 (ref 0.8–1.2)
Prothrombin Time: 14.2 seconds (ref 11.4–15.2)

## 2022-01-27 LAB — HEMOGLOBIN A1C
Hgb A1c MFr Bld: 8.1 % — ABNORMAL HIGH (ref 4.8–5.6)
Mean Plasma Glucose: 185.77 mg/dL

## 2022-01-27 LAB — TSH: TSH: 7.054 u[IU]/mL — ABNORMAL HIGH (ref 0.350–4.500)

## 2022-01-27 MED ORDER — NITROGLYCERIN IN D5W 200-5 MCG/ML-% IV SOLN
0.0000 ug/min | INTRAVENOUS | Status: DC
  Administered 2022-01-27: 5 ug/min via INTRAVENOUS
  Filled 2022-01-27: qty 250

## 2022-01-27 MED ORDER — ATORVASTATIN CALCIUM 40 MG PO TABS: 40.0000 mg | ORAL_TABLET | Freq: Every day | ORAL | Status: DC

## 2022-01-27 MED ORDER — SODIUM CHLORIDE 0.9 % WEIGHT BASED INFUSION
1.0000 mL/kg/h | INTRAVENOUS | Status: DC
  Administered 2022-01-27 – 2022-01-28 (×2): 1 mL/kg/h via INTRAVENOUS

## 2022-01-27 MED ORDER — SODIUM CHLORIDE 0.9 % IV SOLN
250.0000 mL | INTRAVENOUS | Status: DC | PRN
Start: 2022-01-27 — End: 2022-01-28

## 2022-01-27 MED ORDER — INSULIN ASPART 100 UNIT/ML IJ SOLN: 0.0000 [IU] | Freq: Three times a day (TID) | INTRAMUSCULAR | Status: DC

## 2022-01-27 MED ORDER — INSULIN ASPART 100 UNIT/ML IJ SOLN
0.0000 [IU] | Freq: Every day | INTRAMUSCULAR | Status: DC
  Administered 2022-01-28: 2 [IU] via SUBCUTANEOUS

## 2022-01-27 MED ORDER — ASPIRIN EC 81 MG PO TBEC: 81.0000 mg | DELAYED_RELEASE_TABLET | Freq: Every day | ORAL | Status: DC

## 2022-01-27 MED ORDER — SODIUM CHLORIDE 0.9% FLUSH
3.0000 mL | Freq: Two times a day (BID) | INTRAVENOUS | Status: DC
  Administered 2022-01-28 (×2): 3 mL via INTRAVENOUS

## 2022-01-27 MED ORDER — MOMETASONE FURO-FORMOTEROL FUM 200-5 MCG/ACT IN AERO: 2.0000 | INHALATION_SPRAY | Freq: Two times a day (BID) | RESPIRATORY_TRACT | Status: DC

## 2022-01-27 MED ORDER — TRAZODONE HCL 150 MG PO TABS
75.0000 mg | ORAL_TABLET | Freq: Every evening | ORAL | Status: DC | PRN
  Administered 2022-01-27 – 2022-01-28 (×2): 75 mg via ORAL
  Filled 2022-01-27 (×2): qty 1

## 2022-01-27 MED ORDER — SODIUM CHLORIDE 0.9% FLUSH: 3.0000 mL | INTRAVENOUS | Status: DC | PRN

## 2022-01-27 MED ORDER — FOLIC ACID 1 MG PO TABS
1.0000 mg | ORAL_TABLET | Freq: Every day | ORAL | Status: DC
  Administered 2022-01-27 – 2022-01-29 (×3): 1 mg via ORAL
  Filled 2022-01-27 (×3): qty 1

## 2022-01-27 MED ORDER — MAGNESIUM OXIDE 420 MG PO TABS: 420.0000 mg | ORAL_TABLET | Freq: Every day | ORAL | Status: DC

## 2022-01-27 MED ORDER — BRIMONIDINE TARTRATE 0.2 % OP SOLN
1.0000 [drp] | Freq: Two times a day (BID) | OPHTHALMIC | Status: DC
  Administered 2022-01-27 – 2022-01-29 (×4): 1 [drp] via OPHTHALMIC
  Filled 2022-01-27: qty 5

## 2022-01-27 MED ORDER — HYDRALAZINE HCL 25 MG PO TABS
100.0000 mg | ORAL_TABLET | Freq: Three times a day (TID) | ORAL | Status: DC
  Administered 2022-01-27 – 2022-01-29 (×3): 100 mg via ORAL
  Filled 2022-01-27 (×4): qty 4

## 2022-01-27 MED ORDER — IPRATROPIUM-ALBUTEROL 0.5-2.5 (3) MG/3ML IN SOLN: 3.0000 mL | Freq: Four times a day (QID) | RESPIRATORY_TRACT | Status: DC | PRN

## 2022-01-27 MED ORDER — ACETAMINOPHEN 325 MG PO TABS: 650.0000 mg | ORAL_TABLET | ORAL | Status: DC | PRN

## 2022-01-27 MED ORDER — ONDANSETRON HCL 4 MG/2ML IJ SOLN: 4.0000 mg | Freq: Four times a day (QID) | INTRAMUSCULAR | Status: DC | PRN

## 2022-01-27 MED ORDER — TAMSULOSIN HCL 0.4 MG PO CAPS
0.4000 mg | ORAL_CAPSULE | Freq: Every day | ORAL | Status: DC
  Administered 2022-01-27 – 2022-01-28 (×2): 0.4 mg via ORAL
  Filled 2022-01-27 (×2): qty 1

## 2022-01-27 MED ORDER — CARVEDILOL 12.5 MG PO TABS: 12.5000 mg | ORAL_TABLET | Freq: Two times a day (BID) | ORAL | Status: DC

## 2022-01-27 MED ORDER — DOXAZOSIN MESYLATE 2 MG PO TABS
4.0000 mg | ORAL_TABLET | Freq: Every day | ORAL | Status: DC
  Administered 2022-01-27 – 2022-01-28 (×2): 4 mg via ORAL
  Filled 2022-01-27: qty 2
  Filled 2022-01-27: qty 1
  Filled 2022-01-27: qty 2

## 2022-01-27 MED ORDER — ALBUTEROL SULFATE (2.5 MG/3ML) 0.083% IN NEBU: 2.5000 mg | INHALATION_SOLUTION | Freq: Four times a day (QID) | RESPIRATORY_TRACT | Status: DC | PRN

## 2022-01-27 MED ORDER — ASPIRIN 300 MG RE SUPP: 300.0000 mg | RECTAL | Status: AC

## 2022-01-27 MED ORDER — NITROGLYCERIN 0.4 MG SL SUBL
0.4000 mg | SUBLINGUAL_TABLET | SUBLINGUAL | Status: DC | PRN
Start: 2022-01-27 — End: 2022-01-27

## 2022-01-27 MED ORDER — AMLODIPINE BESYLATE 10 MG PO TABS
10.0000 mg | ORAL_TABLET | Freq: Every day | ORAL | Status: DC
  Administered 2022-01-29: 10 mg via ORAL
  Filled 2022-01-27 (×2): qty 1

## 2022-01-27 MED ORDER — HEPARIN BOLUS VIA INFUSION
4000.0000 [IU] | Freq: Once | INTRAVENOUS | Status: AC
  Administered 2022-01-27: 4000 [IU] via INTRAVENOUS
  Filled 2022-01-27: qty 4000

## 2022-01-27 MED ORDER — MOMETASONE FURO-FORMOTEROL FUM 200-5 MCG/ACT IN AERO
2.0000 | INHALATION_SPRAY | Freq: Two times a day (BID) | RESPIRATORY_TRACT | Status: DC
  Administered 2022-01-27 – 2022-01-29 (×4): 2 via RESPIRATORY_TRACT
  Filled 2022-01-27: qty 8.8

## 2022-01-27 MED ORDER — POLYETHYLENE GLYCOL 3350 17 G PO PACK: 17.0000 g | PACK | Freq: Every day | ORAL | Status: DC | PRN

## 2022-01-27 MED ORDER — LATANOPROST 0.005 % OP SOLN
1.0000 [drp] | Freq: Every day | OPHTHALMIC | Status: DC
  Administered 2022-01-27 – 2022-01-28 (×2): 1 [drp] via OPHTHALMIC
  Filled 2022-01-27: qty 2.5

## 2022-01-27 MED ORDER — ASPIRIN 81 MG PO CHEW
243.0000 mg | CHEWABLE_TABLET | Freq: Once | ORAL | Status: AC
  Administered 2022-01-27: 243 mg via ORAL
  Filled 2022-01-27: qty 3

## 2022-01-27 MED ORDER — ASPIRIN 81 MG PO CHEW
81.0000 mg | CHEWABLE_TABLET | ORAL | Status: AC
  Administered 2022-01-28: 81 mg via ORAL
  Filled 2022-01-27: qty 1

## 2022-01-27 MED ORDER — MAGNESIUM OXIDE -MG SUPPLEMENT 400 (240 MG) MG PO TABS
400.0000 mg | ORAL_TABLET | Freq: Every day | ORAL | Status: DC
  Administered 2022-01-27 – 2022-01-29 (×3): 400 mg via ORAL
  Filled 2022-01-27 (×3): qty 1

## 2022-01-27 MED ORDER — HYDROCODONE-ACETAMINOPHEN 5-325 MG PO TABS: 1.0000 | ORAL_TABLET | ORAL | Status: DC | PRN

## 2022-01-27 MED ORDER — ASPIRIN 81 MG PO CHEW: 324.0000 mg | CHEWABLE_TABLET | ORAL | Status: AC

## 2022-01-27 MED ORDER — POLYETHYLENE GLYCOL 3350 17 GM/SCOOP PO POWD
17.0000 g | Freq: Every day | ORAL | Status: DC | PRN
Start: 2022-01-27 — End: 2022-01-27
  Filled 2022-01-27: qty 255

## 2022-01-27 MED ORDER — HEPARIN (PORCINE) 25000 UT/250ML-% IV SOLN
800.0000 [IU]/h | INTRAVENOUS | Status: DC
  Administered 2022-01-27: 18:00:00 1100 [IU]/h via INTRAVENOUS
  Administered 2022-01-28: 1000 [IU]/h via INTRAVENOUS
  Filled 2022-01-27 (×2): qty 250

## 2022-01-27 MED ORDER — ASPIRIN EC 81 MG PO TBEC
81.0000 mg | DELAYED_RELEASE_TABLET | Freq: Every day | ORAL | Status: DC
  Administered 2022-01-29: 81 mg via ORAL
  Filled 2022-01-27 (×2): qty 1

## 2022-01-27 MED ORDER — ALBUTEROL SULFATE HFA 108 (90 BASE) MCG/ACT IN AERS: 2.0000 | INHALATION_SPRAY | Freq: Four times a day (QID) | RESPIRATORY_TRACT | Status: DC | PRN

## 2022-01-27 MED ORDER — AMLODIPINE BESYLATE 5 MG PO TABS: 10.0000 mg | ORAL_TABLET | Freq: Every day | ORAL | Status: DC

## 2022-01-27 NOTE — Progress Notes (Signed)
ANTICOAGULATION CONSULT NOTE - Initial Consult ? ?Pharmacy Consult for Heparin ?Indication: chest pain/ACS ? ?Allergies  ?Allergen Reactions  ? Lisinopril-Hydrochlorothiazide Swelling  ?  Causes swelling of lips  ? Simvastatin Other (See Comments)  ?  Reported by [facility] 04/10/16 - unknown reaction  ? ? ?Patient Measurements: ?Height: 5\' 11"  (180.3 cm) ?Weight: 84 kg (185 lb 3 oz) ?IBW/kg (Calculated) : 75.3 ?Heparin Dosing Weight: 84 kg ? ?Vital Signs: ?Temp: 98.4 ?F (36.9 ?C) (03/06 1430) ?Temp Source: Oral (03/06 1430) ?BP: 158/88 (03/06 1730) ?Pulse Rate: 58 (03/06 1730) ? ?Labs: ?Recent Labs  ?  01/27/22 ?1347  ?HGB 13.8  ?HCT 39.1  ?PLT 213  ?CREATININE 1.84*  ?TROPONINIHS 18*  ? ? ?Estimated Creatinine Clearance: 43.8 mL/min (A) (by C-G formula based on SCr of 1.84 mg/dL (H)). ? ? ?Medical History: ?Past Medical History:  ?Diagnosis Date  ? Alcohol abuse   ? CAD (coronary artery disease)   ? a. Reported MI in 2012 s/p 2 stents;  b. 08/2012 Cath: LM 20, LAD 20 diff ISR, jailed septal - 99%, LCX 30ost, RI large, min irregs, RCA 30p, 20-30 ISR-->Med Rx; c. 2017 Pt reports Neg [facility] @ [facility].  ? CKD (chronic kidney disease), stage III ([place])   ? Cocaine abuse ([place])   ? COPD (chronic obstructive pulmonary disease) ([place])   ? Depression   ? Diabetes mellitus without complication ([place])   ? Hyperlipidemia   ? Hypertension   ? Hypertensive urgency 02/11/2021  ? Noncompliance   ? ? ?Medications:  ?(Not in a hospital admission) ? ?Scheduled:  ?Infusions:  ?PRN:  ? ?Assessment: ?78 yom with a history of CAD, CKD III, COPD, DM, HTN, HLD, EtOH abuse, hyponatremia, necrotizing pancreatitis. Patient is presenting with chest pain. Heparin per pharmacy consult placed for chest pain/ACS. ? ?Patient is not on anticoagulation prior to arrival. ? ?Hgb 13.8; plt 213 ? ?Goal of Therapy:  ?Heparin level 0.3-0.7 units/ml ?Monitor platelets by anticoagulation protocol: Yes ?  ?Plan:  ?Give 4000 units bolus x 1 ?Start  heparin infusion at 1100 units/hr ?Check anti-Xa level in 8 hours and daily while on heparin ?Continue to monitor H&H and platelets ? ?Lorelei Pont, PharmD, BCPS ?01/27/2022 5:52 PM ?ED Clinical Pharmacist -  [phone] ? ? ? ?

## 2022-01-27 NOTE — ED Provider Notes (Signed)
[facility] EMERGENCY DEPARTMENT Provider Note   CSN: [id number] Arrival date & time: 01/27/22  1323     History  Chief Complaint  Patient presents with   Chest Pain    Jose [name] is a 60 y.o. male history of CAD status post stents, here presenting with chest pain.  Patient states that he went to the gym earlier today and got home and had chest pain.  He states that it is intermittent.  Patient is chest pain-free currently.  Denies any shortness of breath.  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  Patient had a stent at that time but was considered to have another stent.   The history is provided by the patient.      Home Medications Prior to Admission medications   Medication Sig Start Date End Date Taking? Authorizing Provider  albuterol (PROVENTIL HFA;VENTOLIN HFA) 108 (90 BASE) MCG/ACT inhaler Inhale 2 puffs into the lungs every 6 (six) hours as needed for wheezing or shortness of breath.     [provider]  amLODipine (NORVASC) 10 MG tablet Take 1 tablet (10 mg total) by mouth daily. 03/12/21   [name], Deloris Ping, MD  aspirin EC 81 MG tablet Take 81 mg by mouth daily. Swallow whole.    [provider]  atorvastatin (LIPITOR) 40 MG tablet Take 1 tablet (40 mg total) by mouth daily at 6 PM. 04/30/19   Rhetta Mura, MD  brimonidine (ALPHAGAN) 0.2 % ophthalmic solution Place 1 drop into both eyes 2 (two) times daily.  04/11/15   [provider]  budesonide-formoterol (SYMBICORT) 160-4.5 MCG/ACT inhaler Inhale 2 puffs into the lungs 2 (two) times daily.    [provider]  carvedilol (COREG) 12.5 MG tablet Take 12.5 mg by mouth 2 (two) times daily with a meal.    [provider]  carvedilol (COREG) 6.25 MG tablet TAKE 1 TABLET (6.25 MG TOTAL) BY MOUTH TWO TIMES DAILY WITH A MEAL. 08/24/20 08/24/21  Lanae Boast, MD  Cyanocobalamin (VITAMIN B-12 PO) Take 1 tablet by mouth  daily.     [provider]  doxazosin (CARDURA) 8 MG tablet Take 4 mg by mouth at bedtime. 04/15/20   [provider]  Fluticasone-Salmeterol (ADVAIR) 500-50 MCG/DOSE AEPB Inhale 1 puff into the lungs in the morning and at bedtime. 12/18/20   [provider]  folic acid (FOLVITE) 1 MG tablet Take 1 tablet (1 mg total) by mouth daily. 04/30/19   Rhetta Mura, MD  furosemide (LASIX) 20 MG tablet Take 1 tablet by mouth every other day. 09/21/20   [provider]  glucose 4 GM chewable tablet Chew 4 g by mouth daily as needed for low blood sugar.     [provider]  hydrALAZINE (APRESOLINE) 100 MG tablet Take 1 tablet (100 mg total) by mouth 3 (three) times daily. 03/12/21   [name], Deloris Ping, MD  HYDROcodone-acetaminophen (NORCO/VICODIN) 5-325 MG tablet Take 1 tablet by mouth every 4 (four) hours as needed. 05/28/21   Jacalyn Lefevre, MD  hydrOXYzine (VISTARIL) 25 MG capsule Take 25 mg by mouth daily as needed for itching. 06/20/20   [provider]  insulin aspart (NOVOLOG) 100 UNIT/ML injection Inject 0-9 Units into the skin 3 (three) times daily with meals. 07/25/15   Kathlen Mody, MD  insulin glargine (LANTUS) 100 UNIT/ML injection Inject 0.3 mLs (30 Units total) into the skin daily before breakfast. 08/30/17   Zannie Cove,  MD  ipratropium-albuterol (DUONEB) 0.5-2.5 (3) MG/3ML SOLN Take 3 mLs by nebulization 4 (four) times daily as needed (shortness of breath/wheezing). 08/07/20   [provider]  latanoprost (XALATAN) 0.005 % ophthalmic solution Place 1 drop into both eyes at bedtime.  04/11/15   [provider]  Magnesium Oxide 420 MG TABS Take 420 mg by mouth daily. 08/22/20   [provider]  methocarbamol (ROBAXIN) 500 MG tablet Take 1 tablet (500 mg total) by mouth 2 (two) times daily. 05/28/21   Jacalyn Lefevre, MD  Multiple Vitamin (MULTIVITAMIN WITH MINERALS) TABS tablet Take 1 tablet by mouth daily.    [provider]  nitroGLYCERIN (NITROSTAT) 0.4 MG SL tablet Place 0.4 mg under the tongue every 5 (five) minutes as needed for chest pain. 09/21/20   [provider]  Omega-3 1000 MG CAPS Take by mouth in the morning and at bedtime.    [provider]  polyethylene glycol powder (GLYCOLAX/MIRALAX) 17 GM/SCOOP powder Take 17 g by mouth daily as needed for mild constipation. 12/18/20   [provider]  sildenafil (VIAGRA) 100 MG tablet Take 100 mg by mouth as needed for erectile dysfunction.    [provider]  tamsulosin (FLOMAX) 0.4 MG CAPS capsule Take 1 capsule by mouth at bedtime. 01/21/21   [provider]  traZODone (DESYREL) 150 MG tablet Take 75 mg by mouth at bedtime.     [provider]      Allergies    Lisinopril-hydrochlorothiazide and Simvastatin    Review of Systems   Review of Systems  Cardiovascular:  Positive for chest pain.  All other systems reviewed and are negative.  Physical Exam Updated Vital Signs BP (!) 169/87    Pulse 61    Temp 98.4 F (36.9 C) (Oral)    Resp 18    Ht 5\' 11"  (1.803 m)    Wt 84 kg    SpO2 100%    BMI 25.83 kg/m  Physical Exam Vitals and nursing note reviewed.  Constitutional:      Comments: Chronically ill  HENT:     Head: Normocephalic.  Eyes:     Extraocular Movements: Extraocular movements intact.     Pupils: Pupils are equal, round, and reactive to light.  Cardiovascular:     Rate and Rhythm: Normal rate and regular rhythm.     Heart sounds: Normal heart sounds.  Pulmonary:     Effort: Pulmonary effort is normal.  Abdominal:     General: Bowel sounds are normal.     Palpations: Abdomen is soft.  Musculoskeletal:        General: Normal range of motion.     Cervical back: Normal range of motion.  Skin:    General: Skin is warm.     Capillary Refill: Capillary refill takes less than 2 seconds.  Neurological:     Mental Status: He is alert.    ED Results / Procedures /  Treatments   Labs (all labs ordered are listed, but only abnormal results are displayed) Labs Reviewed  BASIC METABOLIC PANEL - Abnormal; Notable for the following components:      Result Value   Sodium 130 (*)    CO2 21 (*)    Glucose, Bld 233 (*)    Creatinine, Ser 1.84 (*)    GFR, Estimated 41 (*)    All other components within normal limits  CBC - Abnormal; Notable for the following components:   MCV 78.7 (*)  All other components within normal limits  TROPONIN I (HIGH SENSITIVITY) - Abnormal; Notable for the following components:   Troponin I (High Sensitivity) 18 (*)    All other components within normal limits  TROPONIN I (HIGH SENSITIVITY)    EKG EKG Interpretation  Date/Time:  Monday January 27 2022 13:45:00 EST Ventricular Rate:  68 PR Interval:  216 QRS Duration: 90 QT Interval:  368 QTC Calculation: 391 R Axis:   58 Text Interpretation: Sinus rhythm with 1st degree A-V block Septal infarct , age undetermined Abnormal ECG When compared with ECG of 11-Feb-2021 12:59, PREVIOUS ECG IS PRESENT Confirmed by Richardean Canal ([id number]) on 01/27/2022 3:42:03 PM  Radiology DG Chest 1 View  Result Date: 01/27/2022 CLINICAL DATA:  Chest pain. EXAM: CHEST  1 VIEW COMPARISON:  Chest x-ray dated February 11, 2021. FINDINGS: The heart size and mediastinal contours are within normal limits. Both lungs are clear. The visualized skeletal structures are unremarkable. IMPRESSION: No active disease. Electronically Signed   By: Obie Dredge M.D.   On: 01/27/2022 14:05    Procedures Procedures    Medications Ordered in ED Medications  aspirin chewable tablet 243 mg (243 mg Oral Given 01/27/22 1544)    ED Course/ Medical Decision Making/ A&P                           Medical Decision Making Dylen Junior Amjad is a 61 y.o. male here presenting with chest pain.  Patient has chest pain after going to the gym.  Patient has known CAD with stents.  I reviewed his records from his cath report  last year.  There was concern that patient has multivessel disease and needing another stent.  We will get CBC and CMP and troponin.  Will consult cardiology  3:56 PM Initial trop is 18.  Given aspirin in the ED.  Cardiology consulted.   4:55 PM Cardiology to admit for unstable angina.   Problems Addressed: Chest pain: acute illness or injury Unstable angina [facility]): acute illness or injury  Amount and/or Complexity of Data Reviewed External Data Reviewed: notes. Labs: ordered. Decision-making details documented in ED Course. Radiology: ordered and independent interpretation performed. Decision-making details documented in ED Course. ECG/medicine tests: ordered and independent interpretation performed. Decision-making details documented in ED Course.  Risk OTC drugs.   Final Clinical Impression(s) / ED Diagnoses Final diagnoses:  Chest pain    Rx / DC Orders ED Discharge Orders     None         Charlynne Pander, MD 01/27/22 1655

## 2022-01-27 NOTE — ED Triage Notes (Signed)
Pt arrives POV for eval of acute onset CP starting this AM. Reports hx of cardiac stent placement, here approx 1.5 years ago. Reports this pain feels identical to the pain he experienced then. Reports pain stopped and he went to the gym and then the pain returned afterwards. Denies associated SOB ?

## 2022-01-27 NOTE — ED Provider Triage Note (Signed)
Emergency Medicine Provider Triage Evaluation Note ? ?Jose [name] , a 61 y.o. male  was evaluated in triage.  Pt complains of chest pain.  Patient says chest pain started last night.  Is in the left side of his chest and does not radiate anywhere.  It comes and goes.  He says it is not associated with exertion or anything particular.  Nothing is made it better or worse.  He has not taken anything for the pain.  The last time he had the symptoms was about 1 year ago when he was diagnosed with a heart attack and had a stent placed. ? ?Review of Systems  ?Positive: Chest pain ?Negative:  ? ?Physical Exam  ?BP (!) 164/89 (BP Location: Right Arm)   Pulse 70   Temp 98.6 ?F (37 ?C) (Oral)   Resp 16   Ht 5\' 11"  (1.803 m)   Wt 84 kg   SpO2 100%   BMI 25.83 kg/m?  ?Gen:   Awake, no distress   ?Resp:  Normal effort  ?MSK:   Moves extremities without difficulty  ?Other:   ? ?Medical Decision Making  ?Medically screening exam initiated at 1:45 PM.  Appropriate orders placed.  Junior Rother was informed that the remainder of the evaluation will be completed by another provider, this initial triage assessment does not replace that evaluation, and the importance of remaining in the ED until their evaluation is complete. ? ? ?  ?[name], PA-C ?01/27/22 1345 ? ?

## 2022-01-27 NOTE — H&P (Signed)
Cardiology Admission History and Physical:   Patient ID: Jose [name] MRN: [id number]; DOB: [birth year]   Admission date: 01/27/2022  Primary Care Provider: Clinic, [facility] Cardiologist: Mertie Moores, MD  [place] Electrophysiologist:  None   Chief Complaint:  Chest pain  Patient Profile:   Jose [name] is a 61 y.o. male with : Coronary artery disease  S/p prior stenting to mLAD and dRCA Cath in 2013: patent stents S/p NSTEMI 9/21: pLAD stent and dRCA stents patent; mLM and oLAD mod dz (neg DFR); dRCA 95 (between stents) >> PCI: POBA Chronic kidney disease, stage III COPD  Diabetes mellitus, insulin dependent  Hypertension  Hyperlipidemia  Cocaine abuse  Alcohol abuse  Hyponatremia, likely due to ETOH abuse  Necrotizing pancreatitis    He is being seen today with the evaluation of chest pain at the request of Dr. Shirlyn Goltz  History of Present Illness:   Jose [name] has a history of CAD as stated above.  His most recent heart catheterization was 08/22/2020 at which time cath showed 50% dLM, patent LAD stent with 60 to 70% stenosis prior to the stent hemodynamically insignificant by DFR, very torturous RCA with tandem distal stents before the origin of the PDA with a focal 95% to 99% stenosis between the 2 stents status post PTCA in the distal RCA reducing 95% stenosis to 50%.  Stent was not placed at that time as high-pressure balloon angioplasty was not able to completely dilate the stenosis.  LV function was low normal at 50% with LVEDP 18 mmHg.  He had been in his usual state of health until last night when around 11:00 he was awakened from sleep with chest pain.  He states that it was 10 out of 10 at that time with no radiation.  It was associated with shortness of breath but no nausea or vomiting.  The pain waxed and waned throughout the night but he was able to sleep.  This morning he woke up and had no pain.  He says he got up and ate his  breakfast and then went to the gym where he started walking and then developed 10 out of 10 chest pain again.  When he rested it went away and he went home.  The pain came back again the ask a friend to bring him to the emergency room.  He was given 3 aspirin in the ER with resolution of his pain.  Initial hstroponin was borderline elevated at 18.  ER labs included an elevated serum creatinine 1.83, potassium 4, glucose 233, hemoglobin 13.8.  I personally reviewed the twelve-lead EKG was personally reviewed showing normal sinus rhythm at 68 bpm with nonspecific T wave abnormality in inferior lateral leads.  Cardiology is now asked to evaluate patient for admission   Past Medical History:  Diagnosis Date   Alcohol abuse    CAD (coronary artery disease)    a. Reported MI in 2012 s/p 2 stents;  b. 08/2012 Cath: LM 20, LAD 20 diff ISR, jailed septal - 99%, LCX 30ost, RI large, min irregs, RCA 30p, 20-30 ISR-->Med Rx; c. 2017 Pt [facility] Neg [facility].   CKD (chronic kidney disease), stage III (HCC)    Cocaine abuse (HCC)    COPD (chronic obstructive pulmonary disease) ([place])    Depression    Diabetes mellitus without complication ([place])    Hyperlipidemia    Hypertension    Hypertensive urgency 02/11/2021   Noncompliance  Past Surgical History:  Procedure Laterality Date   CARDIAC CATHETERIZATION     CORONARY STENT PLACEMENT     LEFT HEART CATH AND CORONARY ANGIOGRAPHY N/A 08/22/2020   Procedure: LEFT HEART CATH AND CORONARY ANGIOGRAPHY;  Surgeon: Belva Crome, MD;  Location: [facility];  Service: Cardiovascular;  Laterality: N/A;   LEFT HEART CATHETERIZATION WITH CORONARY ANGIOGRAM Bilateral 08/25/2012   Procedure: LEFT HEART CATHETERIZATION WITH CORONARY ANGIOGRAM;  Surgeon: Burnell Blanks, MD;  Location: [facility] CATH LAB;  Service: Cardiovascular;  Laterality: Bilateral;     Medications Prior to Admission: Prior to Admission medications   Medication Sig Start Date End  Date Taking? Authorizing Provider  albuterol (PROVENTIL HFA;VENTOLIN HFA) 108 (90 BASE) MCG/ACT inhaler Inhale 2 puffs into the lungs every 6 (six) hours as needed for wheezing or shortness of breath.     [provider]  amLODipine (NORVASC) 10 MG tablet Take 1 tablet (10 mg total) by mouth daily. 03/12/21   [name], Wonda Cheng, MD  aspirin EC 81 MG tablet Take 81 mg by mouth daily. Swallow whole.    [provider]  atorvastatin (LIPITOR) 40 MG tablet Take 1 tablet (40 mg total) by mouth daily at 6 PM. 04/30/19   Nita Sells, MD  brimonidine (ALPHAGAN) 0.2 % ophthalmic solution Place 1 drop into both eyes 2 (two) times daily.  04/11/15   [provider]  budesonide-formoterol (SYMBICORT) 160-4.5 MCG/ACT inhaler Inhale 2 puffs into the lungs 2 (two) times daily.    [provider]  carvedilol (COREG) 12.5 MG tablet Take 12.5 mg by mouth 2 (two) times daily with a meal.    [provider]  carvedilol (COREG) 6.25 MG tablet TAKE 1 TABLET (6.25 MG TOTAL) BY MOUTH TWO TIMES DAILY WITH A MEAL. 08/24/20 08/24/21  Antonieta Pert, MD  Cyanocobalamin (VITAMIN B-12 PO) Take 1 tablet by mouth daily.     [provider]  doxazosin (CARDURA) 8 MG tablet Take 4 mg by mouth at bedtime. 04/15/20   [provider]  Fluticasone-Salmeterol (ADVAIR) 500-50 MCG/DOSE AEPB Inhale 1 puff into the lungs in the morning and at bedtime. 12/18/20   [provider]  folic acid (FOLVITE) 1 MG tablet Take 1 tablet (1 mg total) by mouth daily. 04/30/19   Nita Sells, MD  furosemide (LASIX) 20 MG tablet Take 1 tablet by mouth every other day. 09/21/20   [provider]  glucose 4 GM chewable tablet Chew 4 g by mouth daily as needed for low blood sugar.     [provider]  hydrALAZINE (APRESOLINE) 100 MG tablet Take 1 tablet (100 mg total) by mouth 3 (three) times daily. 03/12/21   [name], Wonda Cheng, MD  HYDROcodone-acetaminophen (NORCO/VICODIN)  5-325 MG tablet Take 1 tablet by mouth every 4 (four) hours as needed. 05/28/21   Isla Pence, MD  hydrOXYzine (VISTARIL) 25 MG capsule Take 25 mg by mouth daily as needed for itching. 06/20/20   [provider]  insulin aspart (NOVOLOG) 100 UNIT/ML injection Inject 0-9 Units into the skin 3 (three) times daily with meals. 07/25/15   Hosie Poisson, MD  insulin glargine (LANTUS) 100 UNIT/ML injection Inject 0.3 mLs (30 Units total) into the skin daily before breakfast. 08/30/17   Domenic Polite, MD  ipratropium-albuterol (DUONEB) 0.5-2.5 (3) MG/3ML SOLN Take 3 mLs by nebulization 4 (four) times daily as needed (shortness of breath/wheezing). 08/07/20   [provider]  latanoprost (XALATAN) 0.005 % ophthalmic solution Place 1 drop  into both eyes at bedtime.  04/11/15   [provider]  Magnesium Oxide 420 MG TABS Take 420 mg by mouth daily. 08/22/20   [provider]  methocarbamol (ROBAXIN) 500 MG tablet Take 1 tablet (500 mg total) by mouth 2 (two) times daily. 05/28/21   Isla Pence, MD  Multiple Vitamin (MULTIVITAMIN WITH MINERALS) TABS tablet Take 1 tablet by mouth daily.    [provider]  nitroGLYCERIN (NITROSTAT) 0.4 MG SL tablet Place 0.4 mg under the tongue every 5 (five) minutes as needed for chest pain. 09/21/20   [provider]  Omega-3 1000 MG CAPS Take by mouth in the morning and at bedtime.    [provider]  polyethylene glycol powder (GLYCOLAX/MIRALAX) 17 GM/SCOOP powder Take 17 g by mouth daily as needed for mild constipation. 12/18/20   [provider]  sildenafil (VIAGRA) 100 MG tablet Take 100 mg by mouth as needed for erectile dysfunction.    [provider]  tamsulosin (FLOMAX) 0.4 MG CAPS capsule Take 1 capsule by mouth at bedtime. 01/21/21   [provider]  traZODone (DESYREL) 150 MG tablet Take 75 mg by mouth at bedtime.     [provider]     Allergies:    Allergies   Allergen Reactions   Lisinopril-Hydrochlorothiazide Swelling    Causes swelling of lips   Simvastatin Other (See Comments)    Reported by [facility] 04/10/16 - unknown reaction    Social History:   Social History   Socioeconomic History   Marital status: Married    Spouse name: Not on file   Number of children: Not on file   Years of education: Not on file   Highest education level: Not on file  Occupational History   Not on file  Tobacco Use   Smoking status: Former    Types: Cigarettes    Quit date: 11/25/2007    Years since quitting: 14.1   Smokeless tobacco: Never  Vaping Use   Vaping Use: Never used  Substance and Sexual Activity   Alcohol use: Yes    Comment: Used to drink heavily - says currently 2 beers/day.   Drug use: Yes    Types: Marijuana    Comment: reports not using cocaine   Sexual activity: Yes    Birth control/protection: None  Other Topics Concern   Not on file  Social History Narrative   Lives in [place] by himself.  Does not work or routinely exercise.   Social Determinants of Health   Financial Resource Strain: Not on file  Food Insecurity: Not on file  Transportation Needs: Not on file  Physical Activity: Not on file  Stress: Not on file  Social Connections: Not on file  Intimate Partner Violence: Not on file    Family History:   The patient's family history includes Heart disease in his mother.    ROS:  Please see the history of present illness.  All other ROS reviewed and negative.     Physical Exam/Data:   Vitals:   01/27/22 1340 01/27/22 1430 01/27/22 1530 01/27/22 1651  BP:  123/75 (!) 169/87 (!) 155/84  Pulse:  65 61 (!) 56  Resp:  13 18 17   Temp:  98.4 F (36.9 C)    TempSrc:  Oral    SpO2:  98% 100% 98%  Weight: 84 kg     Height: 5\' 11"  (1.803 m)      No intake or output data in  the 24 hours ending 01/27/22 1707 Last 3 Weights 01/27/2022 03/12/2021 02/13/2021  Weight (lbs) 185 lb 3 oz 184 lb 179 lb 11.2 oz   Weight (kg) 84 kg 83.462 kg 81.511 kg     Body mass index is 25.83 kg/m.  General:  Well nourished, well developed, in no acute distress HEENT: normal Lymph: no adenopathy Neck: no JVD Endocrine:  No thryomegaly Vascular: No carotid bruits; FA pulses 2+ bilaterally without bruits  Cardiac:  normal S1, S2; RRR; no murmur  Lungs:  clear to auscultation bilaterally, no wheezing, rhonchi or rales  Abd: soft, nontender, no hepatomegaly  Ext: no edema Musculoskeletal:  No deformities, BUE and BLE strength normal and equal Skin: warm and dry  Neuro:  CNs 2-12 intact, no focal abnormalities noted Psych:  Normal affect    EKG:  The ECG that was done  was personally reviewed and demonstrates normal sinus rhythm with nonspecific T wave abnormality in the inferior lateral leads  Relevant CV Studies: Cardiac cath 2021 Conclusion  Tubular eccentric distal 50% left main stenosis.  DFR across the left main into the ramus intermedius was 0.99 Heavy left main and proximal LAD calcification.  Patent stent in the proximal LAD.  Proximal to the stent there is 60 to 70% stenosis that was interrogated by DFR and was found to be hemodynamically insignificant at 0.93. The ramus intermedius and circumflex were widely patent The RCA is a very tortuous but large vessel that has tandem distal stents before the origin of the PDA.  Between the 2 stents is a focal 95 to 99% stenosis. Successful PTCA in the distal RCA reducing a 95% stenosis to 50%.  High-pressure balloon angioplasty was not able to completely dilate the stenosis.  Because of this, a stent was not placed.  The procedure was complicated and required use of a guide catheter extender to cross the stenosis. Normal left ventricular function.  LVEDP 18 mmHg.  EF 50%.   RECOMMENDATIONS:   It is hoped that the angioplasty on the focal stenosis in the distal right coronary will be durable.  If symptoms recur, may need to consider Shockwave balloon  angioplasty or some other method fully dilate the lesion.  Tortuosity and distal location makes atherectomy unattractive. Dual antiplatelet therapy with aspirin and Plavix for 6 months.  Laboratory Data:  High Sensitivity Troponin:   Recent Labs  Lab 01/27/22 1347  TROPONINIHS 18*      Chemistry Recent Labs  Lab 01/27/22 1347  NA 130*  K 4.0  CL 99  CO2 21*  GLUCOSE 233*  BUN 23  CREATININE 1.84*  CALCIUM 9.4  GFRNONAA 41*  ANIONGAP 10    No results for input(s): PROT, ALBUMIN, AST, ALT, ALKPHOS, BILITOT in the last 168 hours. Hematology Recent Labs  Lab 01/27/22 1347  WBC 8.2  RBC 4.97  HGB 13.8  HCT 39.1  MCV 78.7*  MCH 27.8  MCHC 35.3  RDW 13.8  PLT 213   BNPNo results for input(s): BNP, PROBNP in the last 168 hours.  DDimer No results for input(s): DDIMER in the last 168 hours.   Radiology/Studies:  DG Chest 1 View  Result Date: 01/27/2022 CLINICAL DATA:  Chest pain. EXAM: CHEST  1 VIEW COMPARISON:  Chest x-ray dated February 11, 2021. FINDINGS: The heart size and mediastinal contours are within normal limits. Both lungs are clear. The visualized skeletal structures are unremarkable. IMPRESSION: No active disease. Electronically Signed   By: Titus Dubin M.D.   On:  01/27/2022 14:05     Assessment and Plan:   Unstable angina pectoris -He has had nearly 24 hours of intermittent severe chest pain both with exertion and at rest -Minimal elevation in troponin at 18 -Currently pain-free after 3 baby ASA -EKG is unchanged from 2022 with nonspecific T wave abnormality in the inferior lateral leads -Suspect he may have restenosis of his previously angioplasty distal RCA where a stent could not be placed but he also had a 60 to 70% stenosis in the proximal LAD, 50% distal left main by cath in 2021 as well. -Serum creatinine is elevated at 1.89  -I think he likely needs repeat left heart catheterization but need to stabilize renal function first -will hold  diuretics and gently hydrate overnight -Repeat bmet in am -Shared Decision Making/Informed Consent The risks [stroke (1 in 1000), death (1 in 1000), kidney failure [usually temporary] (1 in 500), bleeding (1 in 200), allergic reaction [possibly serious] (1 in 200)], benefits (diagnostic support and management of coronary artery disease) and alternatives of a cardiac catheterization were discussed in detail with Mr. Nanda and he is willing to proceed. -We will make n.p.o. after midnight for possible cardiac cath in the a.m. if renal function has improved -Continue aspirin 81 mg daily, carvedilol 12.5 mg twice daily, amlodipine 10 mg daily and statin -Cannot increase beta-blocker further due to resting bradycardia -Start IV heparin per pharmacy protocol -Continue to cycle troponin -repeat 2D echo to reassess LVF  2. HLD -LDL goal less than 55 given significant disease -LDL was 73 in January 2022 -Repeat FLP in a.m. -For now continue atorvastatin 40 mg daily  3.  Hypertension -BP elevated on exam today -Continue hydralazine 100 mg 3 times daily, amlodipine 10 mg daily, carvedilol 12.5 mg twice daily and doxazosin 4 mg daily -May need to consider increasing hydralazine to 4 times daily dosing versus increasing dose of doxazosin further -Cannot use ACE or ARB at this time given his AKI on CKD  4.  AKI on CKD stage 3a -Scr bumped to 1.89 and baseline appears to be around 1.55-1.61 -Blood sugar is poorly controlled on admission so he may have had progression of CKD due to poorly controlled diabetes -hold diuretic and gently hydrate  5.  Diabetes mellitus insulin-dependent -Check hemoglobin A1c in the a.m. -We will ask TR[facility] for consult for help with diabetes management  TIMI Risk Score for Unstable Angina or Non-ST Elevation MI:   The patient's TIMI risk score is 5, which indicates a 26% risk of all cause mortality, new or recurrent myocardial infarction or need for urgent revascularization  in the next 14 days.      Severity of Illness: The appropriate patient status for this patient is OBSERVATION. Observation status is judged to be reasonable and necessary in order to provide the required intensity of service to ensure the patient's safety. The patient's presenting symptoms, physical exam findings, and initial radiographic and laboratory data in the context of their medical condition is felt to place them at decreased risk for further clinical deterioration. Furthermore, it is anticipated that the patient will be medically stable for discharge from the hospital within 2 midnights of admission.    For questions or updates, please contact [place] Please consult [email or web address] for contact info under     Signed, Fransico Him, MD  01/27/2022 5:07 PM

## 2022-01-28 ENCOUNTER — Encounter (HOSPITAL_COMMUNITY)
Admission: EM | Disposition: A | Discharge status: Home / Self Care | Payer: Self-pay | Source: Home / Self Care | Attending: Cardiology

## 2022-01-28 ENCOUNTER — Inpatient Hospital Stay (HOSPITAL_COMMUNITY): Payer: No Typology Code available for payment source

## 2022-01-28 DIAGNOSIS — N1831 Chronic kidney disease, stage 3a: Secondary | ICD-10-CM

## 2022-01-28 DIAGNOSIS — I25119 Atherosclerotic heart disease of native coronary artery with unspecified angina pectoris: Secondary | ICD-10-CM

## 2022-01-28 DIAGNOSIS — N179 Acute kidney failure, unspecified: Secondary | ICD-10-CM

## 2022-01-28 DIAGNOSIS — R079 Chest pain, unspecified: Secondary | ICD-10-CM

## 2022-01-28 HISTORY — PX: LEFT HEART CATH AND CORONARY ANGIOGRAPHY: CATH118249

## 2022-01-28 LAB — BASIC METABOLIC PANEL
Anion gap: 10 (ref 5–15)
BUN: 20 mg/dL (ref 8–23)
CO2: 20 mmol/L — ABNORMAL LOW (ref 22–32)
Calcium: 8.6 mg/dL — ABNORMAL LOW (ref 8.9–10.3)
Chloride: 104 mmol/L (ref 98–111)
Creatinine, Ser: 1.6 mg/dL — ABNORMAL HIGH (ref 0.61–1.24)
GFR, Estimated: 48 mL/min — ABNORMAL LOW (ref >60)
Glucose, Bld: 175 mg/dL — ABNORMAL HIGH (ref 70–99)
Potassium: 3.2 mmol/L — ABNORMAL LOW (ref 3.5–5.1)
Sodium: 134 mmol/L — ABNORMAL LOW (ref 135–145)

## 2022-01-28 LAB — CBC
HCT: 34 % — ABNORMAL LOW (ref 39.0–52.0)
Hemoglobin: 12.2 g/dL — ABNORMAL LOW (ref 13.0–17.0)
MCH: 27.9 pg (ref 26.0–34.0)
MCHC: 35.9 g/dL (ref 30.0–36.0)
MCV: 77.6 fL — ABNORMAL LOW (ref 80.0–100.0)
Platelets: 178 10*3/uL (ref 150–400)
RBC: 4.38 MIL/uL (ref 4.22–5.81)
RDW: 13.8 % (ref 11.5–15.5)
WBC: 6.9 10*3/uL (ref 4.0–10.5)
nRBC: 0 % (ref 0.0–0.2)

## 2022-01-28 LAB — GLUCOSE, CAPILLARY
Glucose-Capillary: 169 mg/dL — ABNORMAL HIGH (ref 70–99)
Glucose-Capillary: 107 mg/dL — ABNORMAL HIGH (ref 70–99)
Glucose-Capillary: 123 mg/dL — ABNORMAL HIGH (ref 70–99)
Glucose-Capillary: 248 mg/dL — ABNORMAL HIGH (ref 70–99)

## 2022-01-28 LAB — HEPARIN LEVEL (UNFRACTIONATED)
Heparin Unfractionated: 0.58 IU/mL (ref 0.30–0.70)
Heparin Unfractionated: 0.72 IU/mL — ABNORMAL HIGH (ref 0.30–0.70)
Heparin Unfractionated: 0.92 IU/mL — ABNORMAL HIGH (ref 0.30–0.70)

## 2022-01-28 LAB — BRAIN NATRIURETIC PEPTIDE: B Natriuretic Peptide: 45.2 pg/mL (ref 0.0–100.0)

## 2022-01-28 LAB — TROPONIN I (HIGH SENSITIVITY): Troponin I (High Sensitivity): 21 ng/L — ABNORMAL HIGH (ref <18)

## 2022-01-28 LAB — ECHOCARDIOGRAM COMPLETE
Area-P 1/2: 2.69 cm2
Calc EF: 50.1 %
Height: 71 in
S' Lateral: 1.9 cm
Single Plane A2C EF: 39.4 %
Single Plane A4C EF: 57.4 %
Weight: 2801.6 oz

## 2022-01-28 LAB — MAGNESIUM: Magnesium: 1.5 mg/dL — ABNORMAL LOW (ref 1.7–2.4)

## 2022-01-28 SURGERY — LEFT HEART CATH AND CORONARY ANGIOGRAPHY
Anesthesia: LOCAL

## 2022-01-28 MED ORDER — CLOPIDOGREL BISULFATE 75 MG PO TABS
75.0000 mg | ORAL_TABLET | Freq: Every day | ORAL | Status: DC
  Administered 2022-01-29: 75 mg via ORAL
  Filled 2022-01-28: qty 1

## 2022-01-28 MED ORDER — POTASSIUM CHLORIDE CRYS ER 20 MEQ PO TBCR
40.0000 meq | EXTENDED_RELEASE_TABLET | Freq: Once | ORAL | Status: AC
  Administered 2022-01-28: 40 meq via ORAL
  Filled 2022-01-28: qty 2

## 2022-01-28 MED ORDER — ACETAMINOPHEN 325 MG PO TABS: 650.0000 mg | ORAL_TABLET | ORAL | Status: DC | PRN

## 2022-01-28 MED ORDER — SODIUM CHLORIDE 0.9 % IV SOLN: INTRAVENOUS | Status: DC

## 2022-01-28 MED ORDER — ATROPINE SULFATE 1 MG/10ML IJ SOSY: 0.5000 mg | PREFILLED_SYRINGE | INTRAMUSCULAR | Status: DC | PRN

## 2022-01-28 MED ORDER — LABETALOL HCL 5 MG/ML IV SOLN: 10.0000 mg | INTRAVENOUS | Status: AC | PRN

## 2022-01-28 MED ORDER — SODIUM CHLORIDE 0.9 % IV SOLN
INTRAVENOUS | Status: AC | PRN
Start: 2022-01-28 — End: 2022-01-28
  Administered 2022-01-28: 84 mL/h via INTRAVENOUS

## 2022-01-28 MED ORDER — HEPARIN SODIUM (PORCINE) 1000 UNIT/ML IJ SOLN
INTRAMUSCULAR | Status: DC | PRN
  Administered 2022-01-28: 4000 [IU] via INTRAVENOUS

## 2022-01-28 MED ORDER — MIDAZOLAM HCL 2 MG/2ML IJ SOLN
INTRAMUSCULAR | Status: DC | PRN
  Administered 2022-01-28: 2 mg via INTRAVENOUS

## 2022-01-28 MED ORDER — SODIUM CHLORIDE 0.9% FLUSH: 3.0000 mL | INTRAVENOUS | Status: DC | PRN

## 2022-01-28 MED ORDER — CLOPIDOGREL BISULFATE 75 MG PO TABS
300.0000 mg | ORAL_TABLET | Freq: Once | ORAL | Status: AC
  Administered 2022-01-28: 300 mg via ORAL
  Filled 2022-01-28: qty 4

## 2022-01-28 MED ORDER — HEPARIN (PORCINE) IN NACL 1000-0.9 UT/500ML-% IV SOLN
INTRAVENOUS | Status: AC
  Filled 2022-01-28: qty 500

## 2022-01-28 MED ORDER — FENTANYL CITRATE (PF) 100 MCG/2ML IJ SOLN
INTRAMUSCULAR | Status: AC
  Filled 2022-01-28: qty 2

## 2022-01-28 MED ORDER — DOPAMINE-DEXTROSE 3.2-5 MG/ML-% IV SOLN
0.0000 ug/kg/min | INTRAVENOUS | Status: DC
  Filled 2022-01-28: qty 250

## 2022-01-28 MED ORDER — ONDANSETRON HCL 4 MG/2ML IJ SOLN: 4.0000 mg | Freq: Four times a day (QID) | INTRAMUSCULAR | Status: DC | PRN

## 2022-01-28 MED ORDER — HYDRALAZINE HCL 20 MG/ML IJ SOLN: 10.0000 mg | INTRAMUSCULAR | Status: AC | PRN

## 2022-01-28 MED ORDER — LIDOCAINE HCL (PF) 1 % IJ SOLN
INTRAMUSCULAR | Status: AC
  Filled 2022-01-28: qty 30

## 2022-01-28 MED ORDER — CARVEDILOL 6.25 MG PO TABS
6.2500 mg | ORAL_TABLET | Freq: Two times a day (BID) | ORAL | Status: DC
  Administered 2022-01-28 – 2022-01-29 (×2): 6.25 mg via ORAL
  Filled 2022-01-28 (×2): qty 1

## 2022-01-28 MED ORDER — VERAPAMIL HCL 2.5 MG/ML IV SOLN
INTRAVENOUS | Status: AC
  Filled 2022-01-28: qty 2

## 2022-01-28 MED ORDER — ATORVASTATIN CALCIUM 80 MG PO TABS
80.0000 mg | ORAL_TABLET | Freq: Every day | ORAL | Status: DC
  Administered 2022-01-28 – 2022-01-29 (×2): 80 mg via ORAL
  Filled 2022-01-28 (×2): qty 1

## 2022-01-28 MED ORDER — SODIUM CHLORIDE 0.9% FLUSH: 3.0000 mL | Freq: Two times a day (BID) | INTRAVENOUS | Status: DC

## 2022-01-28 MED ORDER — ASPIRIN 81 MG PO CHEW: 81.0000 mg | CHEWABLE_TABLET | Freq: Every day | ORAL | Status: DC

## 2022-01-28 MED ORDER — MIDAZOLAM HCL 2 MG/2ML IJ SOLN
INTRAMUSCULAR | Status: AC
  Filled 2022-01-28: qty 2

## 2022-01-28 MED ORDER — IOHEXOL 350 MG/ML SOLN
INTRAVENOUS | Status: DC | PRN
  Administered 2022-01-28: 55 mL

## 2022-01-28 MED ORDER — LIDOCAINE HCL (PF) 1 % IJ SOLN
INTRAMUSCULAR | Status: DC | PRN
  Administered 2022-01-28: 5 mL

## 2022-01-28 MED ORDER — VERAPAMIL HCL 2.5 MG/ML IV SOLN
INTRAVENOUS | Status: DC | PRN
  Administered 2022-01-28: 10 mL via INTRA_ARTERIAL

## 2022-01-28 MED ORDER — ISOSORBIDE MONONITRATE ER 30 MG PO TB24
30.0000 mg | ORAL_TABLET | Freq: Every day | ORAL | Status: DC
  Administered 2022-01-28 – 2022-01-29 (×2): 30 mg via ORAL
  Filled 2022-01-28 (×2): qty 1

## 2022-01-28 MED ORDER — HEPARIN SODIUM (PORCINE) 1000 UNIT/ML IJ SOLN
INTRAMUSCULAR | Status: AC
  Filled 2022-01-28: qty 10

## 2022-01-28 MED ORDER — HEPARIN (PORCINE) IN NACL 1000-0.9 UT/500ML-% IV SOLN
INTRAVENOUS | Status: DC | PRN
  Administered 2022-01-28 (×2): 500 mL

## 2022-01-28 MED ORDER — FENTANYL CITRATE (PF) 100 MCG/2ML IJ SOLN
INTRAMUSCULAR | Status: DC | PRN
  Administered 2022-01-28: 50 ug via INTRAVENOUS

## 2022-01-28 MED ORDER — SODIUM CHLORIDE 0.9 % IV SOLN: 250.0000 mL | INTRAVENOUS | Status: DC | PRN

## 2022-01-28 SURGICAL SUPPLY — 12 items
CATH OPTITORQUE TIG 4.0 5F (CATHETERS) ×2 IMPLANT
DEVICE RAD COMP TR BAND LRG (VASCULAR PRODUCTS) ×2 IMPLANT
GLIDESHEATH SLEND A-KIT 6F 22G (SHEATH) IMPLANT
GLIDESHEATH SLEND SS 6F .021 (SHEATH) ×2 IMPLANT
GUIDEWIRE INQWIRE 1.5J.035X260 (WIRE) IMPLANT
INQWIRE 1.5J .035X260CM (WIRE) ×3
KIT HEART LEFT (KITS) ×3 IMPLANT
PACK CARDIAC CATHETERIZATION (CUSTOM PROCEDURE TRAY) ×3 IMPLANT
SHEATH PROBE COVER 6X72 (BAG) ×2 IMPLANT
TRANSDUCER W/STOPCOCK (MISCELLANEOUS) ×3 IMPLANT
TUBING CIL FLEX 10 FLL-RA (TUBING) ×3 IMPLANT
WIRE HI TORQ VERSACORE-J 145CM (WIRE) ×2 IMPLANT

## 2022-01-28 NOTE — Progress Notes (Signed)
?   ? ? ?  Late note ?On admit pt was unsure coreg dose, pt was discharged on 12.5 BID in 2022 and I did not find any notes with reduction and last time the coreg 6.25 was filled was 08/2020. So he was placed on 12.5 BID. ? ?Pt does take Viagra but last dose was 2 weeks prior to admit. So IV NTG was started.   ? ?Nada Boozer, FNP-C ?At [facility]  ?Pgr:[phone] or after 5pm and on weekends call [phone] ?01/28/2022.  ? ?

## 2022-01-28 NOTE — H&P (View-Only) (Signed)
? ?Progress Note ? ?Patient Name: Jose [name] ?Date of Encounter: 01/28/2022 ? ?[place] [facility] Cardiologist: Mertie Moores, MD  ? ?Subjective  ? ?Patient feeling better from yesterday  Still with a dull discomfort   mIld   Breathing is OK  ? ?Inpatient Medications  ?  ?Scheduled Meds: ? amLODipine  10 mg Oral Daily  ? aspirin  324 mg Oral NOW  ? Or  ? aspirin  300 mg Rectal NOW  ? aspirin EC  81 mg Oral Daily  ? atorvastatin  40 mg Oral q1800  ? brimonidine  1 drop Both Eyes BID  ? doxazosin  4 mg Oral QHS  ? folic acid  1 mg Oral Daily  ? hydrALAZINE  100 mg Oral TID  ? insulin aspart  0-15 Units Subcutaneous TID WC  ? insulin aspart  0-5 Units Subcutaneous QHS  ? latanoprost  1 drop Both Eyes QHS  ? magnesium oxide  400 mg Oral Daily  ? mometasone-formoterol  2 puff Inhalation BID  ? sodium chloride flush  3 mL Intravenous Q12H  ? tamsulosin  0.4 mg Oral QHS  ? ?Continuous Infusions: ? sodium chloride    ? sodium chloride 1 mL/kg/hr (01/28/22 0057)  ? DOPamine    ? heparin 1,100 Units/hr (01/28/22 0452)  ? ?PRN Meds: ?sodium chloride, acetaminophen, albuterol, atropine, HYDROcodone-acetaminophen, ipratropium-albuterol, ondansetron (ZOFRAN) IV, polyethylene glycol, sodium chloride flush, traZODone  ? ?Vital Signs  ?  ?Vitals:  ? 01/28/22 0500 01/28/22 0707 01/28/22 0816 01/28/22 [id number]  ?BP:  126/71  123/71  ?Pulse:  (!) 58    ?Resp:  18 16   ?Temp:  98.4 ?F (36.9 ?C)    ?TempSrc:  Oral    ?SpO2:  95%    ?Weight: 79.4 kg     ?Height:      ? ? ?Intake/Output Summary (Last 24 hours) at 01/28/2022 1025 ?Last data filed at 01/28/2022 1000 ?Gross per 24 hour  ?Intake 1639.83 ml  ?Output 575 ml  ?Net 1064.83 ml  ? ?Last 3 Weights 01/28/2022 01/27/2022 01/27/2022  ?Weight (lbs) 175 lb 1.6 oz 171 lb 4.8 oz 185 lb 3 oz  ?Weight (kg) 79.425 kg 77.7 kg 84 kg  ?   ? ?Telemetry  ?  ?SB/SR   SOme Type I second degree AV blook in middle of night as wlell as junctional rhythm    - Personally Reviewed ? ?ECG  ?  ? No new - Personally  Reviewed ? ?Physical Exam  ? ?GEN: No acute distress.   ?Neck: No JVD ?Cardiac: RRR, no murmurs ?Respiratory: Clear to auscultation bilaterally. ?GI: Soft, nontender, non-distended  ?MS: No edema; No deformity. ?Neuro:  Nonfocal  ?Psych: Normal affect  ? ?Labs  ?  ?High Sensitivity Troponin:   ?Recent Labs  ?Lab 01/27/22 ?1347 01/27/22 ?1545 01/28/22 ?0154  ?TROPONINIHS 18* 19* 21*  ?   ?Chemistry ?Recent Labs  ?Lab 01/27/22 ?1347 01/27/22 ?[id number] 01/28/22 ?0154  ?NA 130*  --  134*  ?K 4.0  --  3.2*  ?CL 99  --  104  ?CO2 21*  --  20*  ?GLUCOSE 233*  --  175*  ?BUN 23  --  20  ?CREATININE 1.84*  --  1.60*  ?CALCIUM 9.4  --  8.6*  ?MG  --  1.7 1.5*  ?GFRNONAA 41*  --  48*  ?ANIONGAP 10  --  10  ?  ?Lipids No results for input(s): CHOL, TRIG, HDL, LABVLDL, LDLCALC, CHOLHDL in the last 168  hours.  ?Hematology ?Recent Labs  ?Lab 01/27/22 ?1347 01/28/22 ?[id number]  ?WBC 8.2 6.9  ?RBC 4.97 4.38  ?HGB 13.8 12.2*  ?HCT 39.1 34.0*  ?MCV 78.7* 77.6*  ?MCH 27.8 27.9  ?MCHC 35.3 35.9  ?RDW 13.8 13.8  ?PLT 213 178  ? ?Thyroid  ?Recent Labs  ?Lab 01/27/22 ?1917  ?TSH 7.054*  ?  ?BNP ?Recent Labs  ?Lab 01/28/22 ?0154  ?BNP 45.2  ?  ?DDimer No results for input(s): DDIMER in the last 168 hours.  ? ?Radiology  ?  ?DG Chest 1 View ? ?Result Date: 01/27/2022 ?CLINICAL DATA:  Chest pain. EXAM: CHEST  1 VIEW COMPARISON:  Chest x-ray dated February 11, 2021. FINDINGS: The heart size and mediastinal contours are within normal limits. Both lungs are clear. The visualized skeletal structures are unremarkable. IMPRESSION: No active disease. Electronically Signed   By: Titus Dubin M.D.   On: 01/27/2022 14:05   ? ?Cardiac Studies  ? ? ? ?Patient Profile  ?   ?61 y.o. male  with hx of CAD, COPD, HTN, HL, cocaine abuse, Etoh abuse ,   Hx of stenting t oLAD and RCA in past   Presents with CP/USA   ? ?Assessment & Plan  ?  ?Unstable angina pectoris ?-He has had nearly 24 hours of intermittent severe chest pain both with exertion and at rest ?   Minimal  flat elevations of troponins   ?-EKG is unchanged from 2022 with nonspecific T wave abnormality in the inferior lateral leads ?-Plan for LHC today to reeval anatomy   ? Restenosis of distal RCA  Also pt is with mod  dz in LM and LAD at cath in 2021   ?--Continue aspirin 81 mg daily, carvedilol 12.5 mg twice daily, amlodipine 10 mg daily and statin ?-Cannot increase beta-blocker further due to resting bradycardia,esp at night ? On IV heparin ?-repeat 2D echo to reassess LVF ?  ?2. HL ?-LDL goal less than 55 given significant disease ?-LDL was 73 in January 2022 ?-Repeat FLP still pending  ?-For now continue atorvastatin 40 mg daily ?  ?3.  Hypertension ?-BP is controlled    ?-Continue hydralazine 100 mg 3 times daily, amlodipine 10 mg daily, carvedilol 12.5 mg twice daily and doxazosin 4 mg daily ? ?-Cannot use ACE or ARB at this time given his AKI on CKD ?  ?4.  AKI on CKD stage 3a ?-Scr is back to baseline 1.6   after hydration ?- CKD nay be  due to poorly controlled diabetes ? ?  ?5.  Diabetes mellitus insulin-dependent ?-Hgb A1C 8.1  Needs tighter control   ?-We will ask TR[facility] for consult for help with diabetes management ? ?For questions or updates, please contact [place] ?Please consult [email or web address] for contact info under  ? ?  ?   ?Signed, ?Dorris Carnes, MD  ?01/28/2022, 10:25 AM   ?  ?

## 2022-01-28 NOTE — Progress Notes (Signed)
ANTICOAGULATION CONSULT NOTE ? ?Pharmacy Consult for Heparin ?Indication: chest pain/ACS ? ?Allergies  ?Allergen Reactions  ? Lisinopril-Hydrochlorothiazide Swelling  ?  Causes swelling of lips  ? Simvastatin Other (See Comments)  ?  Reported by [facility] 04/10/16 - unknown reaction  ? ? ?Patient Measurements: ?Height: 5\' 11"  (180.3 cm) ?Weight: 77.7 kg (171 lb 4.8 oz) ?IBW/kg (Calculated) : 75.3 ?Heparin Dosing Weight: 84 kg ? ?Vital Signs: ?Temp: 97.8 ?F (36.6 ?C) (03/06 2335) ?Temp Source: Oral (03/06 2335) ?BP: 121/74 (03/07 0059) ?Pulse Rate: 53 (03/07 0059) ? ?Labs: ?Recent Labs  ?  01/27/22 ?1347 01/27/22 ?1545 01/27/22 ?1856 01/28/22 ?0154  ?HGB 13.8  --   --   --   ?HCT 39.1  --   --   --   ?PLT 213  --   --   --   ?LABPROT  --   --  14.2  --   ?INR  --   --  1.1  --   ?HEPARINUNFRC  --   --   --  0.58  ?CREATININE 1.84*  --   --  1.60*  ?TROPONINIHS 18* 19*  --  21*  ? ? ? ?Estimated Creatinine Clearance: 50.3 mL/min (A) (by C-G formula based on SCr of 1.6 mg/dL (H)). ? ? ?Medical History: ?Past Medical History:  ?Diagnosis Date  ? Alcohol abuse   ? CAD (coronary artery disease)   ? a. Reported MI in 2012 s/p 2 stents;  b. 08/2012 Cath: LM 20, LAD 20 diff ISR, jailed septal - 99%, LCX 30ost, RI large, min irregs, RCA 30p, 20-30 ISR-->Med Rx; c. 2017 [facility].  ? CKD (chronic kidney disease), stage III ([place])   ? Cocaine abuse ([place])   ? COPD (chronic obstructive pulmonary disease) ([place])   ? Depression   ? Diabetes mellitus without complication ([place])   ? Hyperlipidemia   ? Hypertension   ? Hypertensive urgency 02/11/2021  ? Noncompliance   ? ?Assessment: ?55 yom with a history of CAD, CKD III, COPD, DM, HTN, HLD, EtOH abuse, hyponatremia, necrotizing pancreatitis. Patient is presenting with chest pain. Heparin per pharmacy consult placed for chest pain/ACS. ? ?Patient is not on anticoagulation prior to arrival. ? ?Initial heparin level at goal on 1100 units/hr. No  bleeding or IV issues noted.  ? ?Goal of Therapy:  ?Heparin level 0.3-0.7 units/ml ?Monitor platelets by anticoagulation protocol: Yes ?  ?Plan:  ?Continue heparin infusion at 1100 units/hr ?Check anti-Xa level in 8 hours to confirm then daily while on heparin ?Continue to monitor H&H and platelets ? ?Erin Hearing PharmD., BCPS ?Clinical Pharmacist ?01/28/2022 3:50 AM ? ?

## 2022-01-28 NOTE — Progress Notes (Addendum)
ANTICOAGULATION CONSULT NOTE ? ?Pharmacy Consult for Heparin ?Indication: chest pain/ACS ? ?Allergies  ?Allergen Reactions  ? Lisinopril-Hydrochlorothiazide Swelling  ?  Causes swelling of lips  ? Simvastatin Other (See Comments)  ?  Reported by [facility] 04/10/16 - unknown reaction  ? ? ?Patient Measurements: ?Height: 5\' 11"  (180.3 cm) ?Weight: 79.4 kg (175 lb 1.6 oz) ?IBW/kg (Calculated) : 75.3 ?Heparin Dosing Weight: 77 kg ? ?Vital Signs: ?Temp: 98.7 ?F (37.1 ?C) (03/07 2014) ?Temp Source: Oral (03/07 2014) ?BP: 150/82 (03/07 2014) ?Pulse Rate: 81 (03/07 2014) ? ?Labs: ?Recent Labs  ?  01/27/22 ?1347 01/27/22 ?1545 01/27/22 ?1856 01/28/22 ?0154 01/28/22 ?03/30/22 01/28/22 ?1013 01/28/22 ?2051  ?HGB 13.8  --   --   --  12.2*  --   --   ?HCT 39.1  --   --   --  34.0*  --   --   ?PLT 213  --   --   --  178  --   --   ?LABPROT  --   --  14.2  --   --   --   --   ?INR  --   --  1.1  --   --   --   --   ?HEPARINUNFRC  --   --   --  0.58  --  0.72* 0.92*  ?CREATININE 1.84*  --   --  1.60*  --   --   --   ?TROPONINIHS 18* 19*  --  21*  --   --   --   ? ? ? ?Estimated Creatinine Clearance: 50.3 mL/min (A) (by C-G formula based on SCr of 1.6 mg/dL (H)). ? ? ?Assessment: ?38 yom with unstable angina and Pharmacy consulted for heparin.   ?  ?Heparin level is elevated and trended up to 0.92 units/ml on 1000 units/hr.  lab was collected on the opposite arm from where heparin is infusing.  No issues with infusion or bleeding per RN.   ? ?Goal of Therapy:  ?Heparin level 0.3-0.7 units/ml ?Monitor platelets by anticoagulation protocol: Yes ?  ?Plan:  ?Decrease heparin infusion to 800 units/hr ?Monitor daily heparin level, CBC ?Monitor for signs/symptoms of bleeding  ? ?Aziza Stuckert D. 62, PharmD, BCPS, BCCCP ?01/28/2022, 9:38 PM ? ?

## 2022-01-28 NOTE — Progress Notes (Signed)
Reassessed TR band removed 3CC of air and Incision started to bleed. Cath lab called and came to beside. Manual BP cuff was placed on pt's Right upper air and TR band was removed and reposition and protocol restated with 13CC in TR band.  ? 01/28/22 1530  ?Neurological  ?Level of Consciousness Alert  ?First Vascular Site Assessment  ?#1 - Location of Site Assessment Right radial  ?#1 - Air in Band 17 cc  ?#1 - Dressing Type Transparent dressing  ?#1 - Dressing Status Old drainage (marked)  ? ? ?

## 2022-01-28 NOTE — Interval H&P Note (Signed)
Cath Lab Visit (complete for each Cath Lab visit) ? ?Clinical Evaluation Leading to the Procedure:  ? ?ACS: No. ? ?Non-ACS:   ? ?Anginal Classification: CCS III ? ?Anti-ischemic medical therapy: Maximal Therapy (2 or more classes of medications) ? ?Non-Invasive Test Results: No non-invasive testing performed ? ?Prior CABG: No previous CABG ? ? ? ? ? ?History and Physical Interval Note: ? ?01/28/2022 ?1:58 PM ? ?Jose [name]  has presented today for surgery, with the diagnosis of unstable angina.  The various methods of treatment have been discussed with the patient and family. After consideration of risks, benefits and other options for treatment, the patient has consented to  Procedure(s): ?LEFT HEART CATH AND CORONARY ANGIOGRAPHY (N/A) as a surgical intervention.  The patient's history has been reviewed, patient examined, no change in status, stable for surgery.  I have reviewed the patient's chart and labs.  Questions were answered to the patient's satisfaction.   ? ? ?Nicki [place] ? ? ?

## 2022-01-28 NOTE — Progress Notes (Signed)
Pt Arrived on 4E18 at 15:30 ?Vitals taken ?On tele ? ?TR band site on Right Radial site oozing blood on admission. Site incision noted to have a small skin tear ? ?5cc added to TR band at 15:42 ? ?Will begin to decrease again at 15:52 ? ?Will continue to update ? ?Pt is stable and eating in bed. Call bell within reach ?

## 2022-01-28 NOTE — Significant Event (Signed)
Rapid Response Event Note  ? ?Reason for Call : Symptomatic bradycardia ?Initial Focused Assessment:  ?Notified by nursing staff of pt with HR 30s and BP 70s. Upon arrival, pt was lethargic and diaphoretic. Denied CP and SOB. 12 lead EKG Junctional rhythm 30s. Atropine 0.5mg  IV given as Dr. Joyce Gross arrives at bedside. NTG stopped. Dopamine to bedside in case bradycardia persists. After Atropine, pts HR converted to SB in the mid 50s and pt stated he was feeling better.  ? ?0005-97.23F, HR 59, 95/65 (75), RR 18 sats 100% ? ?Interventions:  ?-12 lead EKG ?--Atropine 0.5mg  IV ?-may start Dopamine infusion if bradycardia persists ?-Stop Ntg ? ? ? ? ? ?MD Notified: Dr. Joyce Gross ?Call Time: 2356 ?Arrival Time: 0001 ?End Time: 0010 ? ?Rose Fillers, RN ?

## 2022-01-28 NOTE — Progress Notes (Signed)
ANTICOAGULATION CONSULT NOTE ? ?Pharmacy Consult for Heparin ?Indication: chest pain/ACS ? ?Allergies  ?Allergen Reactions  ? Lisinopril-Hydrochlorothiazide Swelling  ?  Causes swelling of lips  ? Simvastatin Other (See Comments)  ?  Reported by [facility] 04/10/16 - unknown reaction  ? ? ?Patient Measurements: ?Height: 5\' 11"  (180.3 cm) ?Weight: 79.4 kg (175 lb 1.6 oz) ?IBW/kg (Calculated) : 75.3 ?Heparin Dosing Weight: 77 kg ? ?Vital Signs: ?Temp: 98.4 ?F (36.9 ?C) (03/07 [id number]) ?Temp Source: Oral (03/07 [id number]) ?BP: 123/71 (03/07 [id number]) ?Pulse Rate: 58 (03/07 0707) ? ?Labs: ?Recent Labs  ?  01/27/22 ?1347 01/27/22 ?1545 01/27/22 ?1856 01/28/22 ?0154 01/28/22 ?[id number] 01/28/22 ?1013  ?HGB 13.8  --   --   --  12.2*  --   ?HCT 39.1  --   --   --  34.0*  --   ?PLT 213  --   --   --  178  --   ?LABPROT  --   --  14.2  --   --   --   ?INR  --   --  1.1  --   --   --   ?HEPARINUNFRC  --   --   --  0.58  --  0.72*  ?CREATININE 1.84*  --   --  1.60*  --   --   ?TROPONINIHS 18* 19*  --  21*  --   --   ? ? ?Estimated Creatinine Clearance: 50.3 mL/min (A) (by C-G formula based on SCr of 1.6 mg/dL (H)). ? ? ?Medical History: ?Past Medical History:  ?Diagnosis Date  ? Alcohol abuse   ? CAD (coronary artery disease)   ? a. Reported MI in 2012 s/p 2 stents;  b. 08/2012 Cath: LM 20, LAD 20 diff ISR, jailed septal - 99%, LCX 30ost, RI large, min irregs, RCA 30p, 20-30 ISR-->Med Rx; c. 2017 [facility].  ? CKD (chronic kidney disease), stage III ([place])   ? Cocaine abuse ([place])   ? COPD (chronic obstructive pulmonary disease) ([place])   ? Depression   ? Diabetes mellitus without complication ([place])   ? Hyperlipidemia   ? Hypertension   ? Hypertensive urgency 02/11/2021  ? Noncompliance   ? ?Assessment: ?13 yom with unstable angina. No anticoagulation prior to admission. Pharmacy consulted for heparin.   ?  ?Heparin level 0.72 is supratherapeutic on 1100 units/hr.  No issues with infusion or bleeding per  RN. ? ? ?Goal of Therapy:  ?Heparin level 0.3-0.7 units/ml ?Monitor platelets by anticoagulation protocol: Yes ?  ?Plan:  ?Decrease heparin infusion at 1000 units/hr ?F/u 8hr heparin level  ?Monitor daily heparin level, CBC ?Monitor for signs/symptoms of bleeding  ? ? ?Benetta Spar, PharmD, BCPS, BCCP ?Clinical Pharmacist ? ?Please check AMION for all [place] phone numbers ?After 10:00 PM, call [place] [phone] ? ?

## 2022-01-28 NOTE — Progress Notes (Signed)
Pt came from cath lab little bleeding at incision site added 5CC of air in TR band ? ? ? ? 01/28/22 1520  ?First Vascular Site Assessment  ?#1 - Location of Site Assessment Right radial  ?#1 - Air in Band 20 cc  ?#1 - Dressing Type Transparent dressing  ?#1 - Dressing Status Old drainage (marked)  ?Urine Characteristics  ?Hygiene Other (Comment) ?(CHG)  ? ? ?

## 2022-01-28 NOTE — Progress Notes (Signed)
? ?Progress Note ? ?Patient Name: Jose [name] ?Date of Encounter: 01/28/2022 ? ?[facility] Cardiologist: Philip [name], MD  ? ?Subjective  ? ?Patient feeling better from yesterday  Still with a dull discomfort   mIld   Breathing is OK  ? ?Inpatient Medications  ?  ?Scheduled Meds: ? amLODipine  10 mg Oral Daily  ? aspirin  324 mg Oral NOW  ? Or  ? aspirin  300 mg Rectal NOW  ? aspirin EC  81 mg Oral Daily  ? atorvastatin  40 mg Oral q1800  ? brimonidine  1 drop Both Eyes BID  ? doxazosin  4 mg Oral QHS  ? folic acid  1 mg Oral Daily  ? hydrALAZINE  100 mg Oral TID  ? insulin aspart  0-15 Units Subcutaneous TID WC  ? insulin aspart  0-5 Units Subcutaneous QHS  ? latanoprost  1 drop Both Eyes QHS  ? magnesium oxide  400 mg Oral Daily  ? mometasone-formoterol  2 puff Inhalation BID  ? sodium chloride flush  3 mL Intravenous Q12H  ? tamsulosin  0.4 mg Oral QHS  ? ?Continuous Infusions: ? sodium chloride    ? sodium chloride 1 mL/kg/hr (01/28/22 0057)  ? DOPamine    ? heparin 1,100 Units/hr (01/28/22 0452)  ? ?PRN Meds: ?sodium chloride, acetaminophen, albuterol, atropine, HYDROcodone-acetaminophen, ipratropium-albuterol, ondansetron (ZOFRAN) IV, polyethylene glycol, sodium chloride flush, traZODone  ? ?Vital Signs  ?  ?Vitals:  ? 01/28/22 0500 01/28/22 0707 01/28/22 0816 01/28/22 0838  ?BP:  126/71  123/71  ?Pulse:  (!) 58    ?Resp:  18 16   ?Temp:  98.4 ?F (36.9 ?C)    ?TempSrc:  Oral    ?SpO2:  95%    ?Weight: 79.4 kg     ?Height:      ? ? ?Intake/Output Summary (Last 24 hours) at 01/28/2022 1025 ?Last data filed at 01/28/2022 1000 ?Gross per 24 hour  ?Intake 1639.83 ml  ?Output 575 ml  ?Net 1064.83 ml  ? ?Last 3 Weights 01/28/2022 01/27/2022 01/27/2022  ?Weight (lbs) 175 lb 1.6 oz 171 lb 4.8 oz 185 lb 3 oz  ?Weight (kg) 79.425 kg 77.7 kg 84 kg  ?   ? ?Telemetry  ?  ?SB/SR   SOme Type I second degree AV blook in middle of night as wlell as junctional rhythm    - Personally Reviewed ? ?ECG  ?  ? No new - Personally  Reviewed ? ?Physical Exam  ? ?GEN: No acute distress.   ?Neck: No JVD ?Cardiac: RRR, no murmurs ?Respiratory: Clear to auscultation bilaterally. ?GI: Soft, nontender, non-distended  ?MS: No edema; No deformity. ?Neuro:  Nonfocal  ?Psych: Normal affect  ? ?Labs  ?  ?High Sensitivity Troponin:   ?Recent Labs  ?Lab 01/27/22 ?1347 01/27/22 ?1545 01/28/22 ?0154  ?TROPONINIHS 18* 19* 21*  ?   ?Chemistry ?Recent Labs  ?Lab 01/27/22 ?1347 01/27/22 ?1917 01/28/22 ?0154  ?NA 130*  --  134*  ?K 4.0  --  3.2*  ?CL 99  --  104  ?CO2 21*  --  20*  ?GLUCOSE 233*  --  175*  ?BUN 23  --  20  ?CREATININE 1.84*  --  1.60*  ?CALCIUM 9.4  --  8.6*  ?MG  --  1.7 1.5*  ?GFRNONAA 41*  --  48*  ?ANIONGAP 10  --  10  ?  ?Lipids No results for input(s): CHOL, TRIG, HDL, LABVLDL, LDLCALC, CHOLHDL in the last 168   hours.  ?Hematology ?Recent Labs  ?Lab 01/27/22 ?1347 01/28/22 ?[id number]  ?WBC 8.2 6.9  ?RBC 4.97 4.38  ?HGB 13.8 12.2*  ?HCT 39.1 34.0*  ?MCV 78.7* 77.6*  ?MCH 27.8 27.9  ?MCHC 35.3 35.9  ?RDW 13.8 13.8  ?PLT 213 178  ? ?Thyroid  ?Recent Labs  ?Lab 01/27/22 ?1917  ?TSH 7.054*  ?  ?BNP ?Recent Labs  ?Lab 01/28/22 ?0154  ?BNP 45.2  ?  ?DDimer No results for input(s): DDIMER in the last 168 hours.  ? ?Radiology  ?  ?DG Chest 1 View ? ?Result Date: 01/27/2022 ?CLINICAL DATA:  Chest pain. EXAM: CHEST  1 VIEW COMPARISON:  Chest x-ray dated February 11, 2021. FINDINGS: The heart size and mediastinal contours are within normal limits. Both lungs are clear. The visualized skeletal structures are unremarkable. IMPRESSION: No active disease. Electronically Signed   By: Titus Dubin M.D.   On: 01/27/2022 14:05   ? ?Cardiac Studies  ? ? ? ?Patient Profile  ?   ?61 y.o. male  with hx of CAD, COPD, HTN, HL, cocaine abuse, Etoh abuse ,   Hx of stenting t oLAD and RCA in past   Presents with CP/USA   ? ?Assessment & Plan  ?  ?Unstable angina pectoris ?-He has had nearly 24 hours of intermittent severe chest pain both with exertion and at rest ?   Minimal  flat elevations of troponins   ?-EKG is unchanged from 2022 with nonspecific T wave abnormality in the inferior lateral leads ?-Plan for LHC today to reeval anatomy   ? Restenosis of distal RCA  Also pt is with mod  dz in LM and LAD at cath in 2021   ?--Continue aspirin 81 mg daily, carvedilol 12.5 mg twice daily, amlodipine 10 mg daily and statin ?-Cannot increase beta-blocker further due to resting bradycardia,esp at night ? On IV heparin ?-repeat 2D echo to reassess LVF ?  ?2. HL ?-LDL goal less than 55 given significant disease ?-LDL was 73 in January 2022 ?-Repeat FLP still pending  ?-For now continue atorvastatin 40 mg daily ?  ?3.  Hypertension ?-BP is controlled    ?-Continue hydralazine 100 mg 3 times daily, amlodipine 10 mg daily, carvedilol 12.5 mg twice daily and doxazosin 4 mg daily ? ?-Cannot use ACE or ARB at this time given his AKI on CKD ?  ?4.  AKI on CKD stage 3a ?-Scr is back to baseline 1.6   after hydration ?- CKD nay be  due to poorly controlled diabetes ? ?  ?5.  Diabetes mellitus insulin-dependent ?-Hgb A1C 8.1  Needs tighter control   ?-We will ask [facility] for consult for help with diabetes management ? ?For questions or updates, please contact [place] ?Please consult [email or web address] for contact info under  ? ?  ?   ?Signed, ?Dorris Carnes, MD  ?01/28/2022, 10:25 AM   ?  ?

## 2022-01-29 ENCOUNTER — Encounter (HOSPITAL_COMMUNITY): Payer: Self-pay | Admitting: Cardiovascular Disease

## 2022-01-29 ENCOUNTER — Other Ambulatory Visit (HOSPITAL_COMMUNITY): Payer: Self-pay

## 2022-01-29 DIAGNOSIS — R001 Bradycardia, unspecified: Secondary | ICD-10-CM

## 2022-01-29 DIAGNOSIS — Q239 Congenital malformation of aortic and mitral valves, unspecified: Secondary | ICD-10-CM

## 2022-01-29 DIAGNOSIS — E876 Hypokalemia: Secondary | ICD-10-CM

## 2022-01-29 DIAGNOSIS — E785 Hyperlipidemia, unspecified: Secondary | ICD-10-CM

## 2022-01-29 LAB — CBC
HCT: 31.1 % — ABNORMAL LOW (ref 39.0–52.0)
Hemoglobin: 11.3 g/dL — ABNORMAL LOW (ref 13.0–17.0)
MCH: 28.3 pg (ref 26.0–34.0)
MCHC: 36.3 g/dL — ABNORMAL HIGH (ref 30.0–36.0)
MCV: 77.8 fL — ABNORMAL LOW (ref 80.0–100.0)
Platelets: 163 10*3/uL (ref 150–400)
RBC: 4 MIL/uL — ABNORMAL LOW (ref 4.22–5.81)
RDW: 13.9 % (ref 11.5–15.5)
WBC: 5.1 10*3/uL (ref 4.0–10.5)
nRBC: 0 % (ref 0.0–0.2)

## 2022-01-29 LAB — BASIC METABOLIC PANEL
Anion gap: 7 (ref 5–15)
BUN: 20 mg/dL (ref 8–23)
CO2: 18 mmol/L — ABNORMAL LOW (ref 22–32)
Calcium: 8.3 mg/dL — ABNORMAL LOW (ref 8.9–10.3)
Chloride: 106 mmol/L (ref 98–111)
Creatinine, Ser: 1.69 mg/dL — ABNORMAL HIGH (ref 0.61–1.24)
GFR, Estimated: 45 mL/min — ABNORMAL LOW (ref >60)
Glucose, Bld: 289 mg/dL — ABNORMAL HIGH (ref 70–99)
Potassium: 3.4 mmol/L — ABNORMAL LOW (ref 3.5–5.1)
Sodium: 131 mmol/L — ABNORMAL LOW (ref 135–145)

## 2022-01-29 LAB — GLUCOSE, CAPILLARY
Glucose-Capillary: 203 mg/dL — ABNORMAL HIGH (ref 70–99)
Glucose-Capillary: 190 mg/dL — ABNORMAL HIGH (ref 70–99)

## 2022-01-29 LAB — HEPARIN LEVEL (UNFRACTIONATED)
Heparin Unfractionated: 0.53 IU/mL (ref 0.30–0.70)
Heparin Unfractionated: 0.47 IU/mL (ref 0.30–0.70)

## 2022-01-29 SURGERY — CORONARY STENT INTERVENTION
Anesthesia: LOCAL

## 2022-01-29 MED ORDER — ISOSORBIDE MONONITRATE ER 30 MG PO TB24
30.0000 mg | ORAL_TABLET | Freq: Every day | ORAL | 2 refills | Status: DC
  Filled 2022-01-29: qty 30, 30d supply, fill #0

## 2022-01-29 MED ORDER — POTASSIUM CHLORIDE CRYS ER 20 MEQ PO TBCR
40.0000 meq | EXTENDED_RELEASE_TABLET | Freq: Once | ORAL | Status: AC
  Administered 2022-01-29: 40 meq via ORAL
  Filled 2022-01-29: qty 2

## 2022-01-29 MED ORDER — CLOPIDOGREL BISULFATE 75 MG PO TABS
75.0000 mg | ORAL_TABLET | Freq: Every day | ORAL | 5 refills | Status: AC
  Filled 2022-01-29: qty 30, 30d supply, fill #0

## 2022-01-29 MED ORDER — NITROGLYCERIN 0.4 MG SL SUBL
0.4000 mg | SUBLINGUAL_TABLET | SUBLINGUAL | 2 refills | Status: DC | PRN
  Filled 2022-01-29: qty 25, 7d supply, fill #0

## 2022-01-29 MED ORDER — ATORVASTATIN CALCIUM 80 MG PO TABS
80.0000 mg | ORAL_TABLET | Freq: Every day | ORAL | 2 refills | Status: AC
  Filled 2022-01-29: qty 30, 30d supply, fill #0

## 2022-01-29 MED ORDER — MAGNESIUM SULFATE 2 GM/50ML IV SOLN
2.0000 g | Freq: Once | INTRAVENOUS | Status: AC
  Administered 2022-01-29: 2 g via INTRAVENOUS
  Filled 2022-01-29: qty 50

## 2022-01-29 MED ORDER — PANTOPRAZOLE SODIUM 40 MG PO TBEC
40.0000 mg | DELAYED_RELEASE_TABLET | Freq: Every day | ORAL | 2 refills | Status: DC
  Filled 2022-01-29: qty 30, 30d supply, fill #0

## 2022-01-29 MED ORDER — CARVEDILOL 6.25 MG PO TABS
6.2500 mg | ORAL_TABLET | Freq: Two times a day (BID) | ORAL | 2 refills | Status: DC
Start: 2022-01-29 — End: 2023-04-24
  Filled 2022-01-29: qty 60, 30d supply, fill #0

## 2022-01-29 NOTE — Progress Notes (Signed)
Progress Note  Patient Name: Jose [name] Date of Encounter: 01/29/2022  [facility] Cardiologist: Mertie Moores, MD   Subjective   He has mild chest pain today. Otherwise he is doing well. We discussed that his case will be discussed  Inpatient Medications    Scheduled Meds:  amLODipine  10 mg Oral Daily   aspirin EC  81 mg Oral Daily   atorvastatin  80 mg Oral Daily   brimonidine  1 drop Both Eyes BID   carvedilol  6.25 mg Oral BID WC   clopidogrel  75 mg Oral Q breakfast   doxazosin  4 mg Oral QHS   folic acid  1 mg Oral Daily   hydrALAZINE  100 mg Oral TID   insulin aspart  0-15 Units Subcutaneous TID WC   insulin aspart  0-5 Units Subcutaneous QHS   isosorbide mononitrate  30 mg Oral Daily   latanoprost  1 drop Both Eyes QHS   magnesium oxide  400 mg Oral Daily   mometasone-formoterol  2 puff Inhalation BID   sodium chloride flush  3 mL Intravenous Q12H   sodium chloride flush  3 mL Intravenous Q12H   tamsulosin  0.4 mg Oral QHS   Continuous Infusions:  sodium chloride 100 mL/hr at 01/28/22 1524   sodium chloride     DOPamine     heparin 800 Units/hr (01/29/22 0601)   PRN Meds: sodium chloride, acetaminophen, albuterol, atropine, HYDROcodone-acetaminophen, ipratropium-albuterol, ondansetron (ZOFRAN) IV, polyethylene glycol, sodium chloride flush, traZODone   Vital Signs    Vitals:   01/28/22 2014 01/29/22 0003 01/29/22 0418 01/29/22 0814  BP: (!) 150/82 (!) 106/56 107/66 133/67  Pulse: 81 65 61   Resp: 18 17 16 16   Temp: 98.7 F (37.1 C) 98.4 F (36.9 C) 98.1 F (36.7 C) 98.4 F (36.9 C)  TempSrc: Oral Oral Oral Oral  SpO2: 99% 98% 99% 98%  Weight:   75.9 kg   Height:        Intake/Output Summary (Last 24 hours) at 01/29/2022 0913 Last data filed at 01/29/2022 0601 Gross per 24 hour  Intake 2614.43 ml  Output 475 ml  Net 2139.43 ml   Last 3 Weights 01/29/2022 01/28/2022 01/27/2022  Weight (lbs) 167 lb 4.8 oz 175 lb 1.6 oz 171 lb 4.8 oz   Weight (kg) 75.887 kg 79.425 kg 77.7 kg      Telemetry    Ectopic atrial rhythm rates in the 50s  - Personally Reviewed  ECG     No new - Personally Reviewed  Physical Exam   GEN: No acute distress.   Well appearing Neck: No JVD Cardiac: RRR, no murmurs Respiratory: normal work of breathing. Clear to auscultation bilaterally. GI: Soft, nontender, non-distended  MS: No edema; No deformity. Neuro:  Nonfocal  Psych: Normal affect   Labs    High Sensitivity Troponin:   Recent Labs  Lab 01/27/22 1347 01/27/22 1545 01/28/22 0154  TROPONINIHS 18* 19* 21*     Chemistry Recent Labs  Lab 01/27/22 1347 01/27/22 1917 01/28/22 0154 01/29/22 0130  NA 130*  --  134* 131*  K 4.0  --  3.2* 3.4*  CL 99  --  104 106  CO2 21*  --  20* 18*  GLUCOSE 233*  --  175* 289*  BUN 23  --  20 20  CREATININE 1.84*  --  1.60* 1.69*  CALCIUM 9.4  --  8.6* 8.3*  MG  --  1.7 1.5*  --  GFRNONAA 41*  --  48* 45*  ANIONGAP 10  --  10 7    Lipids No results for input(s): CHOL, TRIG, HDL, LABVLDL, LDLCALC, CHOLHDL in the last 168 hours.  Hematology Recent Labs  Lab 01/27/22 1347 01/28/22 0607 01/29/22 0130  WBC 8.2 6.9 5.1  RBC 4.97 4.38 4.00*  HGB 13.8 12.2* 11.3*  HCT 39.1 34.0* 31.1*  MCV 78.7* 77.6* 77.8*  MCH 27.8 27.9 28.3  MCHC 35.3 35.9 36.3*  RDW 13.8 13.8 13.9  PLT 213 178 163   Thyroid  Recent Labs  Lab 01/27/22 1917  TSH 7.054*    BNP Recent Labs  Lab 01/28/22 0154  BNP 45.2    DDimer No results for input(s): DDIMER in the last 168 hours.   Radiology    DG Chest 1 View  Result Date: 01/27/2022 CLINICAL DATA:  Chest pain. EXAM: CHEST  1 VIEW COMPARISON:  Chest x-ray dated February 11, 2021. FINDINGS: The heart size and mediastinal contours are within normal limits. Both lungs are clear. The visualized skeletal structures are unremarkable. IMPRESSION: No active disease. Electronically Signed   By: Titus Dubin M.D.   On: 01/27/2022 14:05   CARDIAC  CATHETERIZATION  Result Date: 01/28/2022   Prox LAD to Mid LAD lesion is 30% stenosed.   Ost Cx lesion is 75% stenosed.   Mid RCA lesion is 25% stenosed.   Mid LM to Dist LM lesion is 50% stenosed.   Ost LAD to Prox LAD lesion is 50% stenosed.   Dist RCA lesion is 90% stenosed. Multivessel CAD with previously noted smooth 50% distal left main stenosis; the LAD has 50% ostial stenosis and is a calcified vessel with a patent proximal stent with 25 to 30% intimal hyperplasia; patent large bifurcating ramus intermediate vessel; 70 to 80% ostial groove circumflex stenosis; enlarged tortuous dominant RCA which appears to have 2 overlapping distal stents.  There is 90% in-stent restenosis at the site of a very difficult intervention in September 2021 in which PTCA was performed but the balloon could not fully expand and residual stenosis was felt to be 50%. LVEDP 13 mmHg RECOMMENDATION: In this patient with creatinine 1.84 on presentation and previously very difficult intervention Dr. Tamala Julian in September 2021, recommend hydration post diagnostic procedure.  We will initiate increased medical therapy with increased amlodipine, initiation of isosorbide, and resumption of carvedilol.  The intervention in September 2021 proved to be very difficult and required GuideLiner support due to the significant tortuosity of the RCA and very difficult to be in crossing the ptotic lesion even with a 2 oh balloon.  Attempt with score flex Cutting Balloon can be considered if lesion can be predilated.  I am doubtful that a shock wave balloon catheter would be able to reach the lesion.  Will review with colleagues.   ECHOCARDIOGRAM COMPLETE  Result Date: 01/28/2022    ECHOCARDIOGRAM REPORT   Patient Name:   Jose [name] Date of Exam: 01/28/2022 Medical Rec #:  [id number]          Height:       71.0 in Accession #:    [id number]         Weight:       175.1 lb Date of Birth:  [birth year]          BSA:          1.993 m Patient Age:    61  years           BP:  123/71 mmHg Patient Gender: M                  HR:           58 bpm. Exam Location:  Inpatient Procedure: 2D Echo, Cardiac Doppler, Color Doppler and Strain Analysis Indications:    Chest pain  History:        Patient has prior history of Echocardiogram examinations, most                 recent 08/22/2020. CAD, COPD; Risk Factors:Hypertension, Diabetes                 and Dyslipidemia.  Sonographer:    Luisa Hart RDCS Referring Phys: [place]  1. Left ventricular ejection fraction, by estimation, is 65 to 70%. The left ventricle has normal function. The left ventricle has no regional wall motion abnormalities. There is moderate left ventricular hypertrophy. Left ventricular diastolic parameters are consistent with Grade I diastolic dysfunction (impaired relaxation).  2. Right ventricular systolic function is normal. The right ventricular size is normal.  3. The mitral valve is grossly normal. Trivial mitral valve regurgitation.  4. The non-coronary cusp of the aortic valve is thickened and echogenic with a small <5 mm mobile density noted on the non-coronary cusp - ?thrombus or unlikely vegetation, possible artfiact - no AI noted. Aortic valve regurgitation is not visualized. Comparison(s): Changes from prior study are noted. 08/21/2020: LVEF 50-55%. Conclusion(s)/Recommendation(s): Findings concerning for possible small mobile aortic valve density. Clinical correlation is advised. Would consider transesophageal echocardiogram to further evaluate if clinically warranted. FINDINGS  Left Ventricle: Left ventricular ejection fraction, by estimation, is 65 to 70%. The left ventricle has normal function. The left ventricle has no regional wall motion abnormalities. The left ventricular internal cavity size was normal in size. There is  moderate left ventricular hypertrophy. Left ventricular diastolic parameters are consistent with Grade I diastolic dysfunction (impaired  relaxation). Indeterminate filling pressures. Right Ventricle: The right ventricular size is normal. No increase in right ventricular wall thickness. Right ventricular systolic function is normal. Left Atrium: Left atrial size was normal in size. Right Atrium: Right atrial size was normal in size. Pericardium: There is no evidence of pericardial effusion. Mitral Valve: The mitral valve is grossly normal. Trivial mitral valve regurgitation. Tricuspid Valve: The tricuspid valve is grossly normal. Tricuspid valve regurgitation is trivial. Aortic Valve: Small <5 mm mobile mass noted on the non-coronary cusp - ?thrombus or vegetation, possible artfiact - no AI noted. The aortic valve is abnormal. Aortic valve regurgitation is not visualized. Pulmonic Valve: The pulmonic valve was normal in structure. Pulmonic valve regurgitation is not visualized. Aorta: The aortic root and ascending aorta are structurally normal, with no evidence of dilitation. IAS/Shunts: No atrial level shunt detected by color flow Doppler.  LEFT VENTRICLE PLAX 2D LVIDd:         3.70 cm     Diastology LVIDs:         1.90 cm     LV e' medial:    6.75 cm/s LV PW:         1.40 cm     LV E/e' medial:  10.3 LV IVS:        1.50 cm     LV e' lateral:   3.98 cm/s LVOT diam:     2.30 cm     LV E/e' lateral: 17.4 LV SV:         72 LV SV  Index:   36 LVOT Area:     4.15 cm  LV Volumes (MOD) LV vol d, MOD A2C: 37.1 ml LV vol d, MOD A4C: 53.3 ml LV vol s, MOD A2C: 22.5 ml LV vol s, MOD A4C: 22.7 ml LV SV MOD A2C:     14.6 ml LV SV MOD A4C:     53.3 ml LV SV MOD BP:      22.7 ml RIGHT VENTRICLE RV Basal diam:  3.20 cm RV Mid diam:    2.30 cm RV S prime:     20.30 cm/s TAPSE (M-mode): 2.9 cm LEFT ATRIUM             Index        RIGHT ATRIUM           Index LA diam:        3.20 cm 1.61 cm/m   RA Area:     12.70 cm LA Vol (A2C):   48.0 ml 24.09 ml/m  RA Volume:   25.40 ml  12.75 ml/m LA Vol (A4C):   27.3 ml 13.70 ml/m LA Biplane Vol: 36.4 ml 18.27 ml/m  AORTIC  VALVE             PULMONIC VALVE LVOT Vmax:   81.25 cm/s  PV Vmax:          1.02 m/s LVOT Vmean:  51.900 cm/s PV Vmean:         66.900 cm/s LVOT VTI:    0.174 m     PV VTI:           0.232 m                          PV Peak grad:     4.2 mmHg AORTA                    PV Mean grad:     2.0 mmHg Ao Root diam: 2.80 cm    PR End Diast Vel: 4.08 msec Ao Asc diam:  3.40 cm  MITRAL VALVE MV Area (PHT): 2.69 cm    SHUNTS MV Decel Time: 282 msec    Systemic VTI:  0.17 m MV E velocity: 69.40 cm/s  Systemic Diam: 2.30 cm MV A velocity: 95.10 cm/s MV E/A ratio:  0.73 Lyman Bishop MD Electronically signed by Lyman Bishop MD Signature Date/Time: 01/28/2022/12:27:45 PM    Final     Cardiac Studies   1. Left ventricular ejection fraction, by estimation, is 65 to 70%. The  left ventricle has normal function. The left ventricle has no regional  wall motion abnormalities. There is moderate left ventricular hypertrophy.  Left ventricular diastolic  parameters are consistent with Grade I diastolic dysfunction (impaired  relaxation).   2. Right ventricular systolic function is normal. The right ventricular  size is normal.   3. The mitral valve is grossly normal. Trivial mitral valve  regurgitation.   4. The non-coronary cusp of the aortic valve is thickened and echogenic  with a small <5 mm mobile density noted on the non-coronary cusp -  ?thrombus or unlikely vegetation, possible artfiact - no AI noted. Aortic  valve regurgitation is not visualized.   Patient Profile     61 y.o. male  with hx of CAD, COPD, HTN, HL, cocaine abuse, Etoh abuse ,   Hx of stenting t oLAD and RCA in past   Presents with CP/USA    Assessment &  Plan    Unstable angina pectoris -He has had nearly 24 hours of intermittent severe chest pain both with exertion and at rest    Minimal flat elevations of troponins   -EKG is unchanged from 2022 with nonspecific T wave abnormality in the inferior lateral leads - has tortuous RCA with 90%  instent stenosis; lesion is difficult to approach considering challenges with balloon expansion in the distal RCA. Has Ost Circ 75% lesion. Prior LAD PCI patent. 50% distal LM. Planning to discuss his case today. Contrast was spared 2/2 AKI yesterday. Will give clear liquid diet for now, still discussing the case.  - EF is normal, moderate LVH,no wma; however strain reduced in the inferolateral base to mid  --Continue aspirin 81 mg daily, plavix 75 mg daily, carvedilol 6.25 mg twice daily, amlodipine 10 mg daily and statin   On IV heparin  2. Aortic Valve thickening: ?calcification. Was seen on echo 08/22/2020. It's stable  3. Bradycardia- ectopic atrial rhythm - cannot uptitrate BB - received dopamine, now stopped - hold BB for rates < 50   2. HL -LDL goal less than 55 given significant disease -LDL was 73 in January 2022 -Repeat FLP still pending  -For now continue atorvastatin 40 mg daily   3.  Hypertension -BP is controlled    -Continue hydralazine 100 mg 3 times daily, amlodipine 10 mg daily, carvedilol 6.25 mg twice daily and doxazosin 4 mg daily  -Cannot use ACE or ARB at this time given his AKI on CKD   4.  AKI on CKD stage 3a -Scr is back to baseline 1.6   after hydration - Mild Aki improved, back to baseline    5.  Diabetes mellitus insulin-dependent -Hgb A1C 8.1  Needs tighter control   -TRH for consult for help with diabetes management  For questions or updates, please contact [place] Please consult [email or web address] for contact info under        Signed, Janina Mayo, MD  01/29/2022, 9:13 AM

## 2022-01-29 NOTE — Progress Notes (Signed)
Inpatient Diabetes Program Recommendations ? ?AACE/ADA: New Consensus Statement on Inpatient Glycemic Control (2015) ? ?Target Ranges:  Prepandial:   less than 140 mg/dL ?     Peak postprandial:   less than 180 mg/dL (1-2 hours) ?     Critically ill patients:  140 - 180 mg/dL  ? ?Lab Results  ?Component Value Date  ? GLUCAP 190 (H) 01/29/2022  ? HGBA1C 8.1 (H) 01/27/2022  ? ? ?Review of Glycemic Control ? Latest Reference Range & Units 01/27/22 23:54 01/28/22 06:12 01/28/22 12:00 01/28/22 16:28 01/28/22 21:58 01/29/22 06:17 01/29/22 11:22  ?Glucose-Capillary 70 - 99 mg/dL 956 (H) 387 (H) 564 (H) 123 (H) 248 (H) 203 (H) 190 (H)  ? ?Diabetes history: DM 2 ?Outpatient Diabetes medications:  ?Lantus 28 units daily with breakfast ?Current orders for Inpatient glycemic control:  ?Novolog moderate tid with meals and HS ? ?Inpatient Diabetes Program Recommendations:   ? ? ?Please consider adding 1/2 of home basal insulin.  Consider Semglee 14 units daily.  Also note that A1C not at goal.  May benefit from the addition of oral agent for DM such as SGLT-2 if appropriate at discharge?  ? ?Thanks,  ?Beryl Meager, RN, BC-ADM ?Inpatient Diabetes Coordinator ?Pager [phone]  (8a-5p) ? ?

## 2022-01-29 NOTE — Discharge Instructions (Addendum)
Medication Changes: ?- START Plavix 75mg  daily. Take this in addition to your baby Aspirin 81mg  daily. ?- START Isosorbide mononitrate (Imdur) 30mg  daily. ?- DECREASE Carvedilol (Coreg) from 12.5mg  to 6.25mg  twice daily. You can cut your 12.5mg  in tablets in half to finish out what you have at home. ?- INCREASE Atorvastatin (Lipitor) from 40mg  to 80mg  daily.You can take two of your 40mg  tablets to finish out what you have at home. ?- STOP Omeprazole and START Pantoprazole (Protonix) 40mg  daily instead for your reflux. ? ?Continue all other home medications as directed elsewhere on your discharge paperwork. ? ?Post Cardiac Catheterization: ?NO HEAVY LIFTING OR SEXUAL ACTIVITY X 7 DAYS. ?NO DRIVING X 2-3 DAYS. ?NO SOAKING BATHS, HOT TUBS, POOLS, ETC., X 7 DAYS. ? ?Radial Site Care ?Refer to this sheet in the next few weeks. These instructions provide you with information on caring for yourself after your procedure. Your caregiver may also give you more specific instructions. Your treatment has been planned according to current medical practices, but problems sometimes occur. Call your caregiver if you have any problems or questions after your procedure. ?HOME CARE INSTRUCTIONS ?You may shower the day after the procedure. Remove the bandage (dressing) and gently wash the site with plain soap and water. Gently pat the site dry.  ?Do not apply powder or lotion to the site.  ?Do not submerge the affected site in water for 3 to 5 days.  ?Inspect the site at least twice daily.  ?Do not flex or bend the affected arm for 24 hours.  ?No lifting over 5 pounds (2.3 kg) for 5 days after your procedure.  ?Do not drive home if you are discharged the same day of the procedure. Have someone else drive you.  ?What to expect: ?Any bruising will usually fade within 1 to 2 weeks.  ?Blood that collects in the tissue (hematoma) may be painful to the touch. It should usually decrease in size and tenderness within 1 to 2 weeks.  ?SEEK  IMMEDIATE MEDICAL CARE IF: ?You have unusual pain at the radial site.  ?You have redness, warmth, swelling, or pain at the radial site.  ?You have drainage (other than a small amount of blood on the dressing).  ?You have chills.  ?You have a fever or persistent symptoms for more than 72 hours.  ?You have a fever and your symptoms suddenly get worse.  ?Your arm becomes pale, cool, tingly, or numb.  ?You have heavy bleeding from the site. Hold pressure on the site.  ? ?

## 2022-01-29 NOTE — Progress Notes (Signed)
ANTICOAGULATION CONSULT NOTE ? ?Pharmacy Consult for Heparin ?Indication: chest pain/ACS ? ?Allergies  ?Allergen Reactions  ? Lisinopril-Hydrochlorothiazide Swelling  ?  Causes swelling of lips  ? Simvastatin Other (See Comments)  ?  Reported by [facility] 04/10/16 - unknown reaction  ? ? ?Patient Measurements: ?Height: 5\' 11"  (180.3 cm) ?Weight: 75.9 kg (167 lb 4.8 oz) (scale B) ?IBW/kg (Calculated) : 75.3 ?Heparin Dosing Weight: 77 kg ? ?Vital Signs: ?Temp: 98.4 ?F (36.9 ?C) (03/08 [id number]) ?Temp Source: Oral (03/08 [id number]) ?BP: 133/67 (03/08 [id number]) ?Pulse Rate: 61 (03/08 0418) ? ?Labs: ?Recent Labs  ?  01/27/22 ?1347 01/27/22 ?1347 01/27/22 ?1545 01/27/22 ?1856 01/28/22 ?0154 01/28/22 ?[id number] 01/28/22 ?1013 01/28/22 ?2051 01/29/22 ?0130  ?HGB 13.8  --   --   --   --  12.2*  --   --  11.3*  ?HCT 39.1  --   --   --   --  34.0*  --   --  31.1*  ?PLT 213  --   --   --   --  178  --   --  163  ?LABPROT  --   --   --  14.2  --   --   --   --   --   ?INR  --   --   --  1.1  --   --   --   --   --   ?HEPARINUNFRC  --    < >  --   --  0.58  --  0.72* 0.92* 0.53  ?CREATININE 1.84*  --   --   --  1.60*  --   --   --  1.69*  ?TROPONINIHS 18*  --  19*  --  21*  --   --   --   --   ? < > = values in this interval not displayed.  ? ? ?Estimated Creatinine Clearance: 47.7 mL/min (A) (by C-G formula based on SCr of 1.69 mg/dL (H)). ? ? ?Assessment: ?24 yom with unstable angina and Pharmacy consulted for heparin.   ?  ?Heparin level 0.47 is therapeutic on 800 units/hr. Possible discharge today. ? ?Goal of Therapy:  ?Heparin level 0.3-0.7 units/ml ?Monitor platelets by anticoagulation protocol: Yes ?  ?Plan:  ?Continue heparin infusion at 800 units/hr ?Monitor daily heparin level, CBC ?Monitor for signs/symptoms of bleeding  ? ?Benetta Spar, PharmD, BCPS, BCCP ?Clinical Pharmacist ? ?Please check AMION for all [place] phone numbers ?After 10:00 PM, call [place] [phone] ? ?

## 2022-01-29 NOTE — TOC Transition Note (Signed)
Transition of Care (TOC) - CM/SW Discharge Note ?Donn Pierini Charity fundraiser, BSN ?Transitions of Care ?Unit 4E- RN Case Manager ?See Treatment Team for direct phone #  ? ? ?Patient Details  ?Name: Jose [name] ?MRN: [id number] ?Date of Birth: [birth year] ? ?Transition of Care (TOC) CM/SW Contact:  ?Zenda Alpers, [name], RN ?Phone Number: ?01/29/2022, 2:46 PM ? ? ?Clinical Narrative:    ?Pt stable for transition home, Transition of Care Department (TOC) has reviewed patient and no TOC needs have been identified. Pt is followed for PCP needs at the [facility] clinic.  ? ? ?Final next level of care: Home/Self Care ?Barriers to Discharge: No Barriers Identified ? ? ?Patient Goals and CMS Choice ?  ?  ?Choice offered to / list presented to : NA ? ?Discharge Placement ?  ?           ? Home ?  ?  ?  ? ?Discharge Plan and Services ?  ?  ?           ?DME Arranged: N/A ?DME Agency: NA ?  ?  ?  ?HH Arranged: NA ?HH Agency: NA ?  ?  ?  ? ?Social Determinants of Health (SDOH) Interventions ?  ? ? ?Readmission Risk Interventions ?Readmission Risk Prevention Plan 01/29/2022 08/24/2020  ?Transportation Screening Complete Complete  ?PCP or Specialist Appt within 3-5 Days Complete Complete  ?HRI or Home Care Consult Complete Complete  ?Social Work Consult for Recovery Care Planning/Counseling Complete Complete  ?Palliative Care Screening Not Applicable Not Applicable  ?Medication Review Oceanographer) Complete Complete  ?Some recent data might be hidden  ? ? ? ? ? ?

## 2022-01-29 NOTE — Progress Notes (Signed)
?  Mobility Specialist Criteria Algorithm Info. ? ? 01/29/22 1245  ?Mobility  ?Activity Ambulated with assistance in hallway  ?Range of Motion/Exercises Active;All extremities  ?Level of Assistance Independent  ?Assistive Device Other (Comment);None ?(IV pole)  ?Distance Ambulated (ft) 500 ft  ?Activity Response Tolerated well  ? ?HR pre:71 ?HR during: 74 ?HR post: 74 ? ?Patient received in bed agreeable to participate. Ambulated in hallway independently with steady gait. Tolerated well without incident or complaints of CP. Returned to room and was left dangling EOB with all needs met, call bell in reach. ? ?01/29/2022 ?12:59 PM ? ?[place] Kymberlyn Eckford, CMS, BS EXP ?Acute Rehabilitation Services  ?CBIPJ:[phone] ?Office: [phone] ? ?

## 2022-01-29 NOTE — Progress Notes (Signed)
? ?  Was asked to consider PCI of the distal RCA in this patient.Anatomy from prior cath films in 2021 and 2023 were reviewed.  I spoke with the patient as well regarding his symptoms.  He had some chest pain a few days ago that was described as a sharp pain that lasted for a few seconds.  He then went to the gym and walked without any symptoms.  He had no problems with that.  Afterwards, he had a few more seconds of chest pain that resolved spontaneously.  He then came to the hospital to be checked out.  His troponins have been flat.  He has not had any further chest discomfort.  We discussed the risks and benefits of complex intervention given the tortuous RCA that he had and the difficulty that occurred during his PCI in 2021.  Given that his symptoms have resolved, his troponins are negative and I am not sure if his initial symptoms were even cardiac, will plan on medical therapy.  In 2021 when he did have intervention, his troponin was 1200.  He also now has some renal insufficiency which will increase the risk of any invasive procedure.  The patient is in agreement. ? ?Stop heparin. ?Okay for patient to eat. ?We will have him ambulate.  If he feels well, I suspect he will be okay for discharge.  If he has further anginal symptoms, could reconsider intervention of the distal right. ? ?This would require an Amplatz guide along with guide extension and possibly buddy wires to try to deliver a balloon to the area of in-stent restenosis distally. ? ?Corky Crafts, MD ? ?

## 2022-01-29 NOTE — Progress Notes (Signed)
D/C instructions given to patient. Wound care and medications reviewed. All questions answered. IV removed x2. Cab called for transport home. ? ?Versie Starks, RN ? ?

## 2022-01-29 NOTE — Progress Notes (Signed)
ANTICOAGULATION CONSULT NOTE ? ?Pharmacy Consult for Heparin ?Indication: chest pain/ACS ? ?Allergies  ?Allergen Reactions  ? Lisinopril-Hydrochlorothiazide Swelling  ?  Causes swelling of lips  ? Simvastatin Other (See Comments)  ?  Reported by [facility] 04/10/16 - unknown reaction  ? ? ?Patient Measurements: ?Height: 5\' 11"  (180.3 cm) ?Weight: 79.4 kg (175 lb 1.6 oz) ?IBW/kg (Calculated) : 75.3 ?Heparin Dosing Weight: 77 kg ? ?Vital Signs: ?Temp: 98.4 ?F (36.9 ?C) (03/08 0003) ?Temp Source: Oral (03/08 0003) ?BP: 106/56 (03/08 0003) ?Pulse Rate: 65 (03/08 0003) ? ?Labs: ?Recent Labs  ?  01/27/22 ?1347 01/27/22 ?1347 01/27/22 ?1545 01/27/22 ?1856 01/28/22 ?0154 01/28/22 ?03/30/22 01/28/22 ?1013 01/28/22 ?2051 01/29/22 ?0130  ?HGB 13.8  --   --   --   --  12.2*  --   --  11.3*  ?HCT 39.1  --   --   --   --  34.0*  --   --  31.1*  ?PLT 213  --   --   --   --  178  --   --  163  ?LABPROT  --   --   --  14.2  --   --   --   --   --   ?INR  --   --   --  1.1  --   --   --   --   --   ?HEPARINUNFRC  --    < >  --   --  0.58  --  0.72* 0.92* 0.53  ?CREATININE 1.84*  --   --   --  1.60*  --   --   --  1.69*  ?TROPONINIHS 18*  --  19*  --  21*  --   --   --   --   ? < > = values in this interval not displayed.  ? ? ? ?Estimated Creatinine Clearance: 47.7 mL/min (A) (by C-G formula based on SCr of 1.69 mg/dL (H)). ? ? ?Assessment: ?28 yom with unstable angina and Pharmacy consulted for heparin.   ?  ?Heparin level is now at goal 0.53 units/ml on 800 units/hr. No issues with infusion or bleeding per RN.   ? ?Goal of Therapy:  ?Heparin level 0.3-0.7 units/ml ?Monitor platelets by anticoagulation protocol: Yes ?  ?Plan:  ?Continue heparin infusion at 800 units/hr ?Monitor daily heparin level, CBC ?Monitor for signs/symptoms of bleeding  ? ?62 PharmD., BCPS ?Clinical Pharmacist ?01/29/2022 3:38 AM ? ?

## 2022-01-29 NOTE — Discharge Summary (Signed)
Discharge Summary    Patient ID: Jose [name] MRN: [id number]; DOB: 1960-12-05  Admit date: 01/27/2022 Discharge date: 01/29/2022  PCP:  [facility]   [facility] Providers Cardiologist:  Mertie Moores, MD  Discharge Diagnoses    Principal Problem:   Unstable angina [facility]) Active Problems:   CAD (coronary artery disease)   Hypertension   Type 2 diabetes mellitus with complication, with long-term current use of insulin (HCC)   CKD (chronic kidney disease) stage 3, GFR 30-59 ml/min   Hyperlipidemia   Abnormality of aortic valve   Bradycardia   Hypokalemia    Diagnostic Studies/Procedures    Echocardiogram 01/28/2022: Impressions:  1. Left ventricular ejection fraction, by estimation, is 65 to 70%. The  left ventricle has normal function. The left ventricle has no regional  wall motion abnormalities. There is moderate left ventricular hypertrophy.  Left ventricular diastolic  parameters are consistent with Grade I diastolic dysfunction (impaired  relaxation).   2. Right ventricular systolic function is normal. The right ventricular  size is normal.   3. The mitral valve is grossly normal. Trivial mitral valve  regurgitation.   4. The non-coronary cusp of the aortic valve is thickened and echogenic  with a small <5 mm mobile density noted on the non-coronary cusp -  ?thrombus or unlikely vegetation, possible artfiact - no AI noted. Aortic  valve regurgitation is not visualized.   Comparison(s): Changes from prior study are noted. 08/21/2020: LVEF 50-55%.   Conclusion(s)/Recommendation(s): Findings concerning for possible small  mobile aortic valve density. Clinical correlation is advised. Would  consider transesophageal echocardiogram to further evaluate if clinically  warranted.  _____________  Cardiac Catheterization 01/28/2022:   Prox LAD to Mid LAD lesion is 30% stenosed.   Ost Cx lesion is 75% stenosed.   Mid RCA lesion is 25% stenosed.   Mid  LM to Dist LM lesion is 50% stenosed.   Ost LAD to Prox LAD lesion is 50% stenosed.   Dist RCA lesion is 90% stenosed.   Multivessel CAD with previously noted smooth 50% distal left main stenosis; the LAD has 50% ostial stenosis and is a calcified vessel with a patent proximal stent with 25 to 30% intimal hyperplasia; patent large bifurcating ramus intermediate vessel; 70 to 80% ostial groove circumflex stenosis; enlarged tortuous dominant RCA which appears to have 2 overlapping distal stents.  There is 90% in-stent restenosis at the site of a very difficult intervention in September 2021 in which PTCA was performed but the balloon could not fully expand and residual stenosis was felt to be 50%.   LVEDP 13 mmHg   Recommendation: In this patient with creatinine 1.84 on presentation and previously very difficult intervention Dr. Tamala Julian in September 2021, recommend hydration post diagnostic procedure.  We will initiate increased medical therapy with increased amlodipine, initiation of isosorbide, and resumption of carvedilol.  The intervention in September 2021 proved to be very difficult and required GuideLiner support due to the significant tortuosity of the RCA and very difficult to be in crossing the ptotic lesion even with a 2 oh balloon.  Attempt with score flex Cutting Balloon can be considered if lesion can be predilated.  I am doubtful that a shock wave balloon catheter would be able to reach the lesion.  Will review with colleagues.  Diagnostic Dominance: Right    History of Present Illness     Jose [name] is a 61 y.o. male with a history of CAD s/p prior stenting to mid LAD  and distal RCA, hypertension, hyperlipidemia, type 2 diabetes mellitus on insulin, COPD, CKD stage III, and prior cocaine and alcohol abuse who was admitted on 01/27/2022 for unstable angina.  Patient reported he was in his usual state of health until evening of 01/26/2022 around 11:00pm when he was awakened from  sleep with 10/10 non-radiating chest pain with associated with shortness of breath but no nausea or vomiting. The pain waxed and waned throughout the night but he was able to sleep. The morning of presentation he woke up and had no pain. He got up, ate breakfast, and then went to the gym where he started walking and then developed recurrent 10/10 chest pain. It resolved with rest and he went home. However, then the pain returned so he asked a friend to bring him to the ED.  He was given 3 baby Aspirin in the ED with resolution of pain. Initial high-sensitivity troponin was borderline elevated at 18. EKG showed normal sinus rhythm with non-specific T wave changes in inferolateral leads. Labs showed Cr 1.83, K 4.0, Glucose 233, Hgb 12.8.   Hospital Course     Consultants: None  Unstable Angina CAD Patient was admitted for unstable angina as stated above. High-sensitivity troponin minimally elevated and flat at 18 >> 19 >> 21 not consistent with ACS. Echo showed LVEF of 65-70% with normal wall motion, moderate LVH, and grade 1 diastolic dysfunction. Patient underwent cardiac catheterization on 01/28/2022 which showed 50% stenosis of mid to distal left main, 50% stenosis of ostial to proximal LAD followed by 30% stenosis of proximal to mid LAD, 75% stenosis of ostial LCX, 90% stenosis of distal RCA followed by 25% of mid RCA. Patient has a tortuous dominant RCA and has previously had very difficult interventions . Therefore, case was reviewed by interventional team for possible PCI of distal RCA. Given symptoms had resolved and troponins were negative, decision was made to continue with medical therapy. Patient was able to ambulate without any recurrent chest pain. He was started on Plavix 75mg  daily and home Aspirin was continued. He was started on Imdur 30mg  daily. Home Coreg was decreased to 6.25mg  twice daily given bradycardia and home Amlodipine 10mg  daily was continued. Lipitor was increased to 80mg   daily.  Aortic Valve Thickening Echo this admission showed a thickened and echogenic aortic valve with a  small <23mm mobile density noted on the non-coronary cusp. Dr. Harl Bowie reviewed and this was also seen on prior Echo in 07/2020 and is stable. No aortic insufficiency noted. Not felt to be thrombus or vegetation. Possible artifact or calcification.  Bradycardia Patient did have an episode of symptomatic bradycardia with heart rates dropping to the [id number] and systolic BP dropping to the 80s on evening of 01/27/2022. EKG showed a junctional rhythm with rates in the 30s. IV Nitroglycerin was stopped. Atropine was given and patient converted to sinus bradycardia with rates in the 50s and symptoms resolved. Home Coreg was decreased to 6.25mg  twice daily and patient did well with this. Heart rates remained in the 50s to 60s. No recurrent symptomatic bradycardic episodes.  Hypertension BP mildly elevated at time. Home Amlodipine 10mg  daily, Hydralazine 100mg  three times daily, and Cardura 4mg  daily were continued. Coreg was decreased to 6.25mg  twice daily due to bradycardia as above. He was started on Imdur 30mg  daily.   Hyperlipidemia LDL 73 in 11/2020. LDL goal <55 given significant disease and other CV risk factors. Lipitor was increased to 80mg  daily. Will need repeat lipid panel and LFTs in  6-8 weeks.  Type 2 Diabetes Mellitus Hemoglobin A1c 8.1. He was placed on sliding scale insulin during admission. Will restart home insulin at discharged. Will need to follow-up with PCP for further management of this.  CKD Stage III Creatinine 1.84 on admission. Baseline around 1.5 to 1.6. She was hydrated and creatinine improved back to baseline. Creatinine 1.69 on day of discharge. Recommend repeat BMET at follow-up visit.   Hypokalemia Potassium 3.4 on day of discharge. He was supplemented with potassium chloride prior to going home. Will need repeat BMET at follow-up visit.  Patient seen and examined by Dr.  Harl Bowie today and determined to be stable for discharge. Outpatient follow-up arranged. Medications as below.  Did the patient have an acute coronary syndrome (MI, NSTEMI, STEMI, etc) this admission?:  No                               Did the patient have a percutaneous coronary intervention (stent / angioplasty)?:  No.   _____________  Discharge Vitals Blood pressure (!) 154/96, pulse (!) 58, temperature 98.5 F (36.9 C), temperature source Oral, resp. rate 16, height 5\' 11"  (1.803 m), weight 75.9 kg, SpO2 99 %.  Filed Weights   01/27/22 2203 01/28/22 0500 01/29/22 0418  Weight: 77.7 kg 79.4 kg 75.9 kg    Labs & Radiologic Studies    CBC Recent Labs    01/28/22 0607 01/29/22 0130  WBC 6.9 5.1  HGB 12.2* 11.3*  HCT 34.0* 31.1*  MCV 77.6* 77.8*  PLT 178 [id number]   Basic Metabolic Panel Recent Labs    01/27/22 1917 01/28/22 0154 01/29/22 0130  NA  --  134* 131*  K  --  3.2* 3.4*  CL  --  104 106  CO2  --  20* 18*  GLUCOSE  --  175* 289*  BUN  --  20 20  CREATININE  --  1.60* 1.69*  CALCIUM  --  8.6* 8.3*  MG 1.7 1.5*  --    Liver Function Tests No results for input(s): AST, ALT, ALKPHOS, BILITOT, PROT, ALBUMIN in the last 72 hours. No results for input(s): LIPASE, AMYLASE in the last 72 hours. High Sensitivity Troponin:   Recent Labs  Lab 01/27/22 1347 01/27/22 1545 01/28/22 0154  TROPONINIHS 18* 19* 21*    BNP Invalid input(s): POCBNP D-Dimer No results for input(s): DDIMER in the last 72 hours. Hemoglobin A1C Recent Labs    01/27/22 1857  HGBA1C 8.1*   Fasting Lipid Panel No results for input(s): CHOL, HDL, LDLCALC, TRIG, CHOLHDL, LDLDIRECT in the last 72 hours. Thyroid Function Tests Recent Labs    01/27/22 1917  TSH 7.054*   _____________  DG Chest 1 View  Result Date: 01/27/2022 CLINICAL DATA:  Chest pain. EXAM: CHEST  1 VIEW COMPARISON:  Chest x-ray dated February 11, 2021. FINDINGS: The heart size and mediastinal contours are within normal limits.  Both lungs are clear. The visualized skeletal structures are unremarkable. IMPRESSION: No active disease. Electronically Signed   By: Titus Dubin M.D.   On: 01/27/2022 14:05   CARDIAC CATHETERIZATION  Result Date: 01/28/2022   Prox LAD to Mid LAD lesion is 30% stenosed.   Ost Cx lesion is 75% stenosed.   Mid RCA lesion is 25% stenosed.   Mid LM to Dist LM lesion is 50% stenosed.   Ost LAD to Prox LAD lesion is 50% stenosed.   Dist RCA lesion is 90%  stenosed. Multivessel CAD with previously noted smooth 50% distal left main stenosis; the LAD has 50% ostial stenosis and is a calcified vessel with a patent proximal stent with 25 to 30% intimal hyperplasia; patent large bifurcating ramus intermediate vessel; 70 to 80% ostial groove circumflex stenosis; enlarged tortuous dominant RCA which appears to have 2 overlapping distal stents.  There is 90% in-stent restenosis at the site of a very difficult intervention in September 2021 in which PTCA was performed but the balloon could not fully expand and residual stenosis was felt to be 50%. LVEDP 13 mmHg RECOMMENDATION: In this patient with creatinine 1.84 on presentation and previously very difficult intervention Dr. Tamala Julian in September 2021, recommend hydration post diagnostic procedure.  We will initiate increased medical therapy with increased amlodipine, initiation of isosorbide, and resumption of carvedilol.  The intervention in September 2021 proved to be very difficult and required GuideLiner support due to the significant tortuosity of the RCA and very difficult to be in crossing the ptotic lesion even with a 2 oh balloon.  Attempt with score flex Cutting Balloon can be considered if lesion can be predilated.  I am doubtful that a shock wave balloon catheter would be able to reach the lesion.  Will review with colleagues.   ECHOCARDIOGRAM COMPLETE  Result Date: 01/28/2022    ECHOCARDIOGRAM REPORT   Patient Name:   Jose [name] Date of Exam: 01/28/2022  Medical Rec #:  [id number]          Height:       71.0 in Accession #:    [id number]         Weight:       175.1 lb Date of Birth:  [birth year]          BSA:          1.993 m Patient Age:    35 years           BP:           123/71 mmHg Patient Gender: M                  HR:           58 bpm. Exam Location:  Inpatient Procedure: 2D Echo, Cardiac Doppler, Color Doppler and Strain Analysis Indications:    Chest pain  History:        Patient has prior history of Echocardiogram examinations, most                 recent 08/22/2020. CAD, COPD; Risk Factors:Hypertension, Diabetes                 and Dyslipidemia.  Sonographer:    Luisa Hart RDCS Referring Phys: [place]  1. Left ventricular ejection fraction, by estimation, is 65 to 70%. The left ventricle has normal function. The left ventricle has no regional wall motion abnormalities. There is moderate left ventricular hypertrophy. Left ventricular diastolic parameters are consistent with Grade I diastolic dysfunction (impaired relaxation).  2. Right ventricular systolic function is normal. The right ventricular size is normal.  3. The mitral valve is grossly normal. Trivial mitral valve regurgitation.  4. The non-coronary cusp of the aortic valve is thickened and echogenic with a small <5 mm mobile density noted on the non-coronary cusp - ?thrombus or unlikely vegetation, possible artfiact - no AI noted. Aortic valve regurgitation is not visualized. Comparison(s): Changes from prior study are noted. 08/21/2020: LVEF 50-55%. Conclusion(s)/Recommendation(s): Findings concerning for possible  small mobile aortic valve density. Clinical correlation is advised. Would consider transesophageal echocardiogram to further evaluate if clinically warranted. FINDINGS  Left Ventricle: Left ventricular ejection fraction, by estimation, is 65 to 70%. The left ventricle has normal function. The left ventricle has no regional wall motion abnormalities. The left  ventricular internal cavity size was normal in size. There is  moderate left ventricular hypertrophy. Left ventricular diastolic parameters are consistent with Grade I diastolic dysfunction (impaired relaxation). Indeterminate filling pressures. Right Ventricle: The right ventricular size is normal. No increase in right ventricular wall thickness. Right ventricular systolic function is normal. Left Atrium: Left atrial size was normal in size. Right Atrium: Right atrial size was normal in size. Pericardium: There is no evidence of pericardial effusion. Mitral Valve: The mitral valve is grossly normal. Trivial mitral valve regurgitation. Tricuspid Valve: The tricuspid valve is grossly normal. Tricuspid valve regurgitation is trivial. Aortic Valve: Small <5 mm mobile mass noted on the non-coronary cusp - ?thrombus or vegetation, possible artfiact - no AI noted. The aortic valve is abnormal. Aortic valve regurgitation is not visualized. Pulmonic Valve: The pulmonic valve was normal in structure. Pulmonic valve regurgitation is not visualized. Aorta: The aortic root and ascending aorta are structurally normal, with no evidence of dilitation. IAS/Shunts: No atrial level shunt detected by color flow Doppler.  LEFT VENTRICLE PLAX 2D LVIDd:         3.70 cm     Diastology LVIDs:         1.90 cm     LV e' medial:    6.75 cm/s LV PW:         1.40 cm     LV E/e' medial:  10.3 LV IVS:        1.50 cm     LV e' lateral:   3.98 cm/s LVOT diam:     2.30 cm     LV E/e' lateral: 17.4 LV SV:         72 LV SV Index:   36 LVOT Area:     4.15 cm  LV Volumes (MOD) LV vol d, MOD A2C: 37.1 ml LV vol d, MOD A4C: 53.3 ml LV vol s, MOD A2C: 22.5 ml LV vol s, MOD A4C: 22.7 ml LV SV MOD A2C:     14.6 ml LV SV MOD A4C:     53.3 ml LV SV MOD BP:      22.7 ml RIGHT VENTRICLE RV Basal diam:  3.20 cm RV Mid diam:    2.30 cm RV S prime:     20.30 cm/s TAPSE (M-mode): 2.9 cm LEFT ATRIUM             Index        RIGHT ATRIUM           Index LA diam:         3.20 cm 1.61 cm/m   RA Area:     12.70 cm LA Vol (A2C):   48.0 ml 24.09 ml/m  RA Volume:   25.40 ml  12.75 ml/m LA Vol (A4C):   27.3 ml 13.70 ml/m LA Biplane Vol: 36.4 ml 18.27 ml/m  AORTIC VALVE             PULMONIC VALVE LVOT Vmax:   81.25 cm/s  PV Vmax:          1.02 m/s LVOT Vmean:  51.900 cm/s PV Vmean:         66.900 cm/s LVOT VTI:  0.174 m     PV VTI:           0.232 m                          PV Peak grad:     4.2 mmHg AORTA                    PV Mean grad:     2.0 mmHg Ao Root diam: 2.80 cm    PR End Diast Vel: 4.08 msec Ao Asc diam:  3.40 cm  MITRAL VALVE MV Area (PHT): 2.69 cm    SHUNTS MV Decel Time: 282 msec    Systemic VTI:  0.17 m MV E velocity: 69.40 cm/s  Systemic Diam: 2.30 cm MV A velocity: 95.10 cm/s MV E/A ratio:  0.73 Lyman Bishop MD Electronically signed by Lyman Bishop MD Signature Date/Time: 01/28/2022/12:27:45 PM    Final    Disposition   Patient is being discharged home today in good condition.  Follow-up Plans & Appointments     Follow-up Information     Swinyer, Lanice Schwab, NP Follow up.   Specialty: Nurse Practitioner Why: Hospital follow-up with Cardiology scheduled for 02/07/2022 at 10:15am. Please arrive 15 minutes early for check-in. If this date/time does not work for you, please call our office to reschedule. Contact information: [place] [address] [phone]         Cl[facility], [place] Call.   Why: Please call and schedule a follow-up visit with your primary care provider within the next couple of weeks for further management of your diabetes. Contact information: [place] [address] [phone]                Discharge Instructions     Diet - low sodium heart healthy   Complete by: As directed    Increase activity slowly   Complete by: As directed        Discharge Medications   Allergies as of 01/29/2022       Reactions    Lisinopril-hydrochlorothiazide Swelling   Causes swelling of lips   Simvastatin Other (See Comments)   Reported by [facility] 04/10/16 - unknown reaction        Medication List     STOP taking these medications    folic acid 1 MG tablet Commonly known as: FOLVITE   OMEPRAZOLE PO       TAKE these medications    albuterol 108 (90 Base) MCG/ACT inhaler Commonly known as: VENTOLIN HFA Inhale 2 puffs into the lungs every 6 (six) hours as needed for wheezing or shortness of breath.   amLODipine 10 MG tablet Commonly known as: NORVASC Take 1 tablet (10 mg total) by mouth daily.   aspirin EC 81 MG tablet Take 81 mg by mouth daily. Swallow whole.   atorvastatin 80 MG tablet Commonly known as: LIPITOR Take 1 tablet (80 mg total) by mouth daily. Start taking on: January 30, 2022 What changed:  medication strength how much to take when to take this   brimonidine 0.2 % ophthalmic solution Commonly known as: ALPHAGAN Place 1 drop into both eyes 2 (two) times daily.   budesonide-formoterol 160-4.5 MCG/ACT inhaler Commonly known as: SYMBICORT Inhale 2 puffs into the lungs 2 (two) times daily.   carvedilol 6.25 MG tablet Commonly known as: COREG Take 1 tablet (6.25 mg total) by mouth 2 (  two) times daily with a meal. What changed:  medication strength how much to take   clopidogrel 75 MG tablet Commonly known as: PLAVIX Take 1 tablet (75 mg total) by mouth daily with breakfast. Start taking on: January 30, 2022   doxazosin 8 MG tablet Commonly known as: CARDURA Take 4 mg by mouth at bedtime.   furosemide 20 MG tablet Commonly known as: LASIX Take 1 tablet by mouth every other day.   glucose 4 GM chewable tablet Chew 4 g by mouth daily as needed for low blood sugar.   hydrALAZINE 100 MG tablet Commonly known as: APRESOLINE Take 1 tablet (100 mg total) by mouth 3 (three) times daily. What changed: when to take this   HYDROcodone-acetaminophen 5-325  MG tablet Commonly known as: NORCO/VICODIN Take 1 tablet by mouth every 4 (four) hours as needed. What changed: reasons to take this   hydrOXYzine 25 MG capsule Commonly known as: VISTARIL Take 25 mg by mouth daily as needed for itching.   insulin glargine 100 UNIT/ML injection Commonly known as: LANTUS Inject 0.3 mLs (30 Units total) into the skin daily before breakfast. What changed: how much to take   ipratropium-albuterol 0.5-2.5 (3) MG/3ML Soln Commonly known as: DUONEB Take 3 mLs by nebulization 4 (four) times daily as needed (shortness of breath/wheezing).   isosorbide mononitrate 30 MG 24 hr tablet Commonly known as: IMDUR Take 1 tablet (30 mg total) by mouth daily. Start taking on: January 30, 2022   latanoprost 0.005 % ophthalmic solution Commonly known as: XALATAN Place 1 drop into both eyes at bedtime.   Magnesium Oxide 420 MG Tabs Take 420 mg by mouth daily.   multivitamin with minerals Tabs tablet Take 1 tablet by mouth daily.   nitroGLYCERIN 0.4 MG SL tablet Commonly known as: NITROSTAT Place 1 tablet (0.4 mg total) under the tongue every 5 (five) minutes as needed for chest pain.   Omega-3 1000 MG Caps Take by mouth in the morning and at bedtime.   pantoprazole 40 MG tablet Commonly known as: Protonix Take 1 tablet (40 mg total) by mouth daily.   polyethylene glycol powder 17 GM/SCOOP powder Commonly known as: GLYCOLAX/MIRALAX Take 17 g by mouth daily as needed for mild constipation.   sildenafil 100 MG tablet Commonly known as: VIAGRA Take 100 mg by mouth as needed for erectile dysfunction.   tamsulosin 0.4 MG Caps capsule Commonly known as: FLOMAX Take 1 capsule by mouth at bedtime.   traZODone 150 MG tablet Commonly known as: DESYREL Take 75 mg by mouth at bedtime.   VITAMIN B-12 PO Take 1 tablet by mouth daily.           Outstanding Labs/Studies   Repeat BMET at follow-up visit. Repeat lipid panel and LFTs in 6-8  weeks.  Duration of Discharge Encounter   Greater than 30 minutes including physician time.  Signed, Darreld Mclean, PA-C 01/29/2022, 3:29 PM

## 2022-02-05 ENCOUNTER — Telehealth (HOSPITAL_COMMUNITY): Payer: Self-pay | Admitting: Pharmacist

## 2022-02-05 NOTE — Telephone Encounter (Signed)
Transitions of Care Pharmacy  ° °Call attempted for a pharmacy transitions of care follow-up. HIPAA appropriate voicemail was left with call back information provided.  ° °Call attempt #1. Will follow-up in 2-3 days.  °  °

## 2022-02-06 ENCOUNTER — Telehealth (HOSPITAL_COMMUNITY): Payer: Self-pay

## 2022-02-06 NOTE — Telephone Encounter (Signed)
Transitions of Care Pharmacy   Call attempted for a pharmacy transitions of care follow-up. HIPAA appropriate voicemail was left with call back information provided.   Call attempt #2. Will follow-up in 2-3 days.    

## 2022-02-07 ENCOUNTER — Other Ambulatory Visit (HOSPITAL_COMMUNITY): Payer: Self-pay

## 2022-02-07 ENCOUNTER — Ambulatory Visit: Payer: Medicare HMO | Admitting: Nurse Practitioner

## 2022-02-07 ENCOUNTER — Telehealth (HOSPITAL_COMMUNITY): Payer: Self-pay

## 2022-02-07 NOTE — Telephone Encounter (Signed)
Pharmacy Transitions of Care Follow-up Telephone Call ? ?Date of discharge: 01/29/22  ?Discharge Diagnosis: stent placement ? ?How have you been since you were released from the hospital? Patient's pantoprazole has been giving him a headache so he has not been taking that medication. He has not been taking his Plavix since discharge. He has not been taking his Imdur. Patient has only been taking his Carvedilol as half a tab BID but he appears to be taking the correct dose [facility]. Went over all of patient's medications with him post discharge. Instructed patient to bring ALL of his medications with him to PCP follow up on Monday so they can separate old Rx bottles from [place] Rx bottles. Instructed patient to start taking Isosorbide and Clopidogrel. Instructed patient he did not have to take pantoprazole if it was giving him a headache. No other questions about meds at this time. ? ?Medication changes made at discharge: ?    START taking: ?clopidogrel (PLAVIX)  ?isosorbide mononitrate (IMDUR)  ?pantoprazole (Protonix)  ?CHANGE how you take: ?atorvastatin (LIPITOR)  ?carvedilol (COREG)  ?STOP taking: ?folic acid 1 MG tablet (FOLVITE)  ?OMEPRAZOLE PO  ? ?Medication changes verified by the patient? Yes ?  ? ?Medication Accessibility: ? ?[facility]  ? ?Was the patient provided with refills on discharged medications? Yes  ? ?Have all prescriptions been transferred from [facility] to home pharmacy? No, cannot transfer to [place]  ? ?Is the patient able to afford medications? Has insurance ?  ? ?Medication Review: ? ?CLOPIDOGREL (PLAVIX) ?Clopidogrel 75 mg once daily.  ?- Advised patient of medications to avoid (NSAIDs, ASA)  ?- Educated that Tylenol (acetaminophen) will be the preferred analgesic to prevent risk of bleeding  ?- Emphasized importance of monitoring for signs and symptoms of bleeding (abnormal bruising, prolonged bleeding, nose bleeds, bleeding from gums, discolored urine, black  tarry stools)  ?- Advised patient to alert all providers of anticoagulation therapy prior to starting a new medication or having a procedure  ? ?Follow-up Appointments: ? ?PCP Hospital f/u appt confirmed? Scheduled to see PCP on 02/10/22 ? ?If their condition worsens, is the pt aware to call PCP or go to the Emergency Dept.? Yes ? ?Final Patient Assessment: ?Patient has f/u scheduled and knows to get refills at f/u ? ?

## 2022-02-07 NOTE — Progress Notes (Deleted)
?Cardiology Office Note:   ? ?Date:  02/07/2022  ? ?ID:  Jose [name], DOB 1961/08/04, MRN [id number] ? ?PCP:  [facility] ?  ?[facility] Providers ?Cardiologist:  Mertie Moores, MD { ?Click to update primary MD,subspecialty MD or APP then REFRESH:1}   ? ?Referring MD: [facility]  ? ?No chief complaint on file. ?*** ? ?History of Present Illness:   ? ?Jose [name] is a 61 y.o. male with a hx of CAD s/p stenting to mid LAD and distal RCA, hyperlipidemia HTN, COPD, diabetes, prior alcohol and cocaine abuse who was admitted on 01/27/22 for unstable angina ? ?He reported he was in his usual state of health until the evening of 01/26/2022 around 11 PM when he was awakened from sleep with 10 out of 10 nonradiating chest pain associated with shortness of breath.  No nausea/vomiting.  Pain waxed and waned throughout the night but he was able to sleep.  On the morning of 01/27/2022 he woke up without pain, got up and ate breakfast and went to the gym.  He started walking and had recurrent 10 out of 10 chest pain.  It resolved with rest and he went home however later the pain returned and he asked a friend to drive him to the ED. Upon arrival, hs troponin elevated and flat at 18 ? 19 ? 21 not consistent with ACS. Echo revealed LVEF 65-70% with normal wall motion, moderate LVH, grade 1 DD. He underwent cardiac cath on 01/28/22 which showed 50% stenosis of mid to distal left main, 50% stenosis  ? ? ? ? ? ?Past Medical History:  ?Diagnosis Date  ? Alcohol abuse   ? CAD (coronary artery disease)   ? a. Reported MI in 2012 s/p 2 stents;  b. 08/2012 Cath: LM 20, LAD 20 diff ISR, jailed septal - 99%, LCX 30ost, RI large, min irregs, RCA 30p, 20-30 ISR-->Med Rx; c. 2017 [facility]  ? CKD (chronic kidney disease), stage III ([place])   ? Cocaine abuse ([place])   ? COPD (chronic obstructive pulmonary disease) ([place])   ? Depression   ? Diabetes mellitus without complication ([place])   ?  Hyperlipidemia   ? Hypertension   ? Hypertensive urgency 02/11/2021  ? Noncompliance   ? ? ?Past Surgical History:  ?Procedure Laterality Date  ? CARDIAC CATHETERIZATION    ? CORONARY STENT PLACEMENT    ? LEFT HEART CATH AND CORONARY ANGIOGRAPHY N/A 08/22/2020  ? Procedure: LEFT HEART CATH AND CORONARY ANGIOGRAPHY;  Surgeon: Belva Crome, MD;  Location: [facility];  Service: Cardiovascular;  Laterality: N/A;  ? LEFT HEART CATH AND CORONARY ANGIOGRAPHY N/A 01/28/2022  ? Procedure: LEFT HEART CATH AND CORONARY ANGIOGRAPHY;  Surgeon: Troy Sine, MD;  Location: [facility];  Service: Cardiovascular;  Laterality: N/A;  ? LEFT HEART CATHETERIZATION WITH CORONARY ANGIOGRAM Bilateral 08/25/2012  ? Procedure: LEFT HEART CATHETERIZATION WITH CORONARY ANGIOGRAM;  Surgeon: Burnell Blanks, MD;  Location: [facility] C[facility] LAB;  Service: Cardiovascular;  Laterality: Bilateral;  ? ? ?Current Medications: ?No outpatient medications have been marked as taking for the 02/07/22 encounter (Appointment) with Ann Maki, Lanice Schwab, NP.  ?  ? ?Allergies:   Lisinopril-hydrochlorothiazide and Simvastatin  ? ?Social History  ? ?Socioeconomic History  ? Marital status: Married  ?  Spouse name: Not on file  ? Number of children: Not on file  ? Years of education: Not on file  ? Highest  education level: Not on file  ?Occupational History  ? Not on file  ?Tobacco Use  ? Smoking status: Former  ?  Types: Cigarettes  ?  Quit date: 11/25/2007  ?  Years since quitting: 14.2  ? Smokeless tobacco: Never  ?Vaping Use  ? Vaping Use: Never used  ?Substance and Sexual Activity  ? Alcohol use: Yes  ?  Comment: Used to drink heavily - says currently 2 beers/day.  ? Drug use: Yes  ?  Types: Marijuana  ?  Comment: reports not using cocaine  ? Sexual activity: Yes  ?  Birth control/protection: None  ?Other Topics Concern  ? Not on file  ?Social History Narrative  ? Lives in [place] by himself.  Does not work or routinely exercise.  ? ?Social Determinants  of Health  ? ?Financial Resource Strain: Not on file  ?Food Insecurity: Not on file  ?Transportation Needs: Not on file  ?Physical Activity: Not on file  ?Stress: Not on file  ?Social Connections: Not on file  ?  ? ?Family History: ?The patient's family history includes Heart disease in his mother. ? ?ROS:   ?Please see the history of present illness.    ?*** All other systems reviewed and are negative. ? ?Labs/Other Studies Reviewed:   ? ?The following studies were reviewed today: ?*** ? ?Recent Labs: ?01/27/2022: TSH 7.054 ?01/28/2022: B Natriuretic Peptide 45.2; Magnesium 1.5 ?01/29/2022: BUN 20; Creatinine, Ser 1.69; Hemoglobin 11.3; Platelets 163; Potassium 3.4; Sodium 131  ?Recent Lipid Panel ?   ?Component Value Date/Time  ? CHOL 148 12/14/2020 0849  ? TRIG 116 12/14/2020 0849  ? HDL 54 12/14/2020 0849  ? CHOLHDL 2.7 12/14/2020 0849  ? CHOLHDL 3.3 08/22/2020 0535  ? VLDL 9 08/22/2020 0535  ? [place] 73 12/14/2020 0849  ? ? ? ?Risk Assessment/Calculations:   ?{Does this patient have ATRIAL FIBRILLATION?[phone]} ? ?    ? ?Physical Exam:   ? ?VS:  There were no vitals taken for this visit.   ? ?Wt Readings from Last 3 Encounters:  ?01/29/22 167 lb 4.8 oz (75.9 kg)  ?03/12/21 184 lb (83.5 kg)  ?02/13/21 179 lb 11.2 oz (81.5 kg)  ?  ? ?GEN: *** Well nourished, well developed in no acute distress ?HEENT: Normal ?NECK: No JVD; No carotid bruits ?CARDIAC: ***RRR, no murmurs, rubs, gallops ?RESPIRATORY:  Clear to auscultation without rales, wheezing or rhonchi  ?ABDOMEN: Soft, non-tender, non-distended ?MUSCULOSKELETAL:  No edema; No deformity. *** pedal pulses, ***bilaterally ?SKIN: Warm and dry ?NEUROLOGIC:  Alert and oriented x 3 ?PSYCHIATRIC:  Normal affect  ? ?EKG:  EKG is *** ordered today.  The ekg ordered today demonstrates *** ? ?Diagnoses:   ? ?No diagnosis found. ?Assessment and Plan:   ? ? ?*** ? ?   ? ?   ? ?{Are you ordering a CV Procedure (e.g. stress test, cath, DCCV, TEE, etc)?   Press F2         :[id number]  ? ? ?Medication Adjustments/Labs and Tests Ordered: ?Current medicines are reviewed at length with the patient today.  Concerns regarding medicines are outlined above.  ?No orders of the defined types were placed in this encounter. ? ?No orders of the defined types were placed in this encounter. ? ? ?There are no Patient Instructions on file for this visit.  ? ?Signed, ?Swati Granberry, Lanice Schwab, NP  ?02/07/2022 9:44 AM    ?[place]  ? ?

## 2022-02-18 ENCOUNTER — Other Ambulatory Visit (HOSPITAL_COMMUNITY): Payer: Self-pay

## 2022-02-18 MED FILL — Medication: Qty: 1 | Status: AC

## 2022-02-19 ENCOUNTER — Other Ambulatory Visit (HOSPITAL_COMMUNITY): Payer: Self-pay

## 2022-02-20 ENCOUNTER — Other Ambulatory Visit (HOSPITAL_COMMUNITY): Payer: Self-pay

## 2022-04-22 ENCOUNTER — Emergency Department (HOSPITAL_COMMUNITY)
Admission: EM | Admit: 2022-04-22 | Discharge: 2022-04-22 | Disposition: A | Discharge status: Home / Self Care | Payer: Medicare HMO | Attending: Emergency Medicine | Admitting: Emergency Medicine

## 2022-04-22 ENCOUNTER — Emergency Department (HOSPITAL_COMMUNITY): Payer: Medicare HMO

## 2022-04-22 ENCOUNTER — Other Ambulatory Visit: Payer: Self-pay

## 2022-04-22 DIAGNOSIS — Z7902 Long term (current) use of antithrombotics/antiplatelets: Secondary | ICD-10-CM | POA: Diagnosis not present

## 2022-04-22 DIAGNOSIS — J449 Chronic obstructive pulmonary disease, unspecified: Secondary | ICD-10-CM | POA: Diagnosis not present

## 2022-04-22 DIAGNOSIS — R0601 Orthopnea: Secondary | ICD-10-CM | POA: Insufficient documentation

## 2022-04-22 DIAGNOSIS — N189 Chronic kidney disease, unspecified: Secondary | ICD-10-CM | POA: Diagnosis not present

## 2022-04-22 DIAGNOSIS — Z794 Long term (current) use of insulin: Secondary | ICD-10-CM | POA: Insufficient documentation

## 2022-04-22 DIAGNOSIS — E1122 Type 2 diabetes mellitus with diabetic chronic kidney disease: Secondary | ICD-10-CM | POA: Diagnosis not present

## 2022-04-22 DIAGNOSIS — R42 Dizziness and giddiness: Secondary | ICD-10-CM | POA: Insufficient documentation

## 2022-04-22 DIAGNOSIS — Z79899 Other long term (current) drug therapy: Secondary | ICD-10-CM | POA: Insufficient documentation

## 2022-04-22 DIAGNOSIS — R0602 Shortness of breath: Secondary | ICD-10-CM | POA: Diagnosis not present

## 2022-04-22 DIAGNOSIS — Z7951 Long term (current) use of inhaled steroids: Secondary | ICD-10-CM | POA: Diagnosis not present

## 2022-04-22 DIAGNOSIS — I129 Hypertensive chronic kidney disease with stage 1 through stage 4 chronic kidney disease, or unspecified chronic kidney disease: Secondary | ICD-10-CM | POA: Insufficient documentation

## 2022-04-22 DIAGNOSIS — Z20822 Contact with and (suspected) exposure to covid-19: Secondary | ICD-10-CM | POA: Insufficient documentation

## 2022-04-22 DIAGNOSIS — E1165 Type 2 diabetes mellitus with hyperglycemia: Secondary | ICD-10-CM | POA: Insufficient documentation

## 2022-04-22 DIAGNOSIS — Z7982 Long term (current) use of aspirin: Secondary | ICD-10-CM | POA: Insufficient documentation

## 2022-04-22 DIAGNOSIS — I2510 Atherosclerotic heart disease of native coronary artery without angina pectoris: Secondary | ICD-10-CM | POA: Diagnosis not present

## 2022-04-22 DIAGNOSIS — I10 Essential (primary) hypertension: Secondary | ICD-10-CM | POA: Diagnosis not present

## 2022-04-22 DIAGNOSIS — J441 Chronic obstructive pulmonary disease with (acute) exacerbation: Secondary | ICD-10-CM

## 2022-04-22 DIAGNOSIS — R778 Other specified abnormalities of plasma proteins: Secondary | ICD-10-CM | POA: Insufficient documentation

## 2022-04-22 LAB — RAPID URINE DRUG SCREEN, HOSP PERFORMED
Amphetamines: NOT DETECTED
Barbiturates: NOT DETECTED
Benzodiazepines: NOT DETECTED
Cocaine: NOT DETECTED
Opiates: NOT DETECTED
Tetrahydrocannabinol: POSITIVE — AB

## 2022-04-22 LAB — URINALYSIS, ROUTINE W REFLEX MICROSCOPIC
Bacteria, UA: NONE SEEN
Bilirubin Urine: NEGATIVE
Glucose, UA: 150 mg/dL — AB
Hgb urine dipstick: NEGATIVE
Ketones, ur: NEGATIVE mg/dL
Leukocytes,Ua: NEGATIVE
Nitrite: NEGATIVE
Protein, ur: 30 mg/dL — AB
Specific Gravity, Urine: 1.017 (ref 1.005–1.030)
pH: 5 (ref 5.0–8.0)

## 2022-04-22 LAB — CBC WITH DIFFERENTIAL/PLATELET
Abs Immature Granulocytes: 0.02 10*3/uL (ref 0.00–0.07)
Basophils Absolute: 0 10*3/uL (ref 0.0–0.1)
Basophils Relative: 0 %
Eosinophils Absolute: 0.4 10*3/uL (ref 0.0–0.5)
Eosinophils Relative: 6 %
HCT: 36.5 % — ABNORMAL LOW (ref 39.0–52.0)
Hemoglobin: 13.6 g/dL (ref 13.0–17.0)
Immature Granulocytes: 0 %
Lymphocytes Relative: 38 %
Lymphs Abs: 2.9 10*3/uL (ref 0.7–4.0)
MCH: 28.3 pg (ref 26.0–34.0)
MCHC: 37.3 g/dL — ABNORMAL HIGH (ref 30.0–36.0)
MCV: 75.9 fL — ABNORMAL LOW (ref 80.0–100.0)
Monocytes Absolute: 0.6 10*3/uL (ref 0.1–1.0)
Monocytes Relative: 8 %
Neutro Abs: 3.7 10*3/uL (ref 1.7–7.7)
Neutrophils Relative %: 48 %
Platelets: 266 10*3/uL (ref 150–400)
RBC: 4.81 MIL/uL (ref 4.22–5.81)
RDW: 13.3 % (ref 11.5–15.5)
WBC: 7.7 10*3/uL (ref 4.0–10.5)
nRBC: 0 % (ref 0.0–0.2)

## 2022-04-22 LAB — BASIC METABOLIC PANEL
Anion gap: 10 (ref 5–15)
BUN: 25 mg/dL — ABNORMAL HIGH (ref 8–23)
CO2: 22 mmol/L (ref 22–32)
Calcium: 10 mg/dL (ref 8.9–10.3)
Chloride: 99 mmol/L (ref 98–111)
Creatinine, Ser: 1.87 mg/dL — ABNORMAL HIGH (ref 0.61–1.24)
GFR, Estimated: 40 mL/min — ABNORMAL LOW (ref >60)
Glucose, Bld: 297 mg/dL — ABNORMAL HIGH (ref 70–99)
Potassium: 3.5 mmol/L (ref 3.5–5.1)
Sodium: 131 mmol/L — ABNORMAL LOW (ref 135–145)

## 2022-04-22 LAB — HEPATIC FUNCTION PANEL
ALT: 24 U/L (ref 0–44)
AST: 23 U/L (ref 15–41)
Albumin: 3.8 g/dL (ref 3.5–5.0)
Alkaline Phosphatase: 66 U/L (ref 38–126)
Bilirubin, Direct: 0.1 mg/dL (ref 0.0–0.2)
Indirect Bilirubin: 0.6 mg/dL (ref 0.3–0.9)
Total Bilirubin: 0.7 mg/dL (ref 0.3–1.2)
Total Protein: 6.5 g/dL (ref 6.5–8.1)

## 2022-04-22 LAB — BRAIN NATRIURETIC PEPTIDE: B Natriuretic Peptide: 48.9 pg/mL (ref 0.0–100.0)

## 2022-04-22 LAB — TROPONIN I (HIGH SENSITIVITY)
Troponin I (High Sensitivity): 33 ng/L — ABNORMAL HIGH (ref <18)
Troponin I (High Sensitivity): 26 ng/L — ABNORMAL HIGH (ref <18)

## 2022-04-22 LAB — RESP PANEL BY RT-PCR (FLU A&B, COVID) ARPGX2
Influenza A by PCR: NEGATIVE
Influenza B by PCR: NEGATIVE
SARS Coronavirus 2 by RT PCR: NEGATIVE

## 2022-04-22 LAB — MAGNESIUM: Magnesium: 1.2 mg/dL — ABNORMAL LOW (ref 1.7–2.4)

## 2022-04-22 MED ORDER — CARVEDILOL 3.125 MG PO TABS
6.2500 mg | ORAL_TABLET | Freq: Two times a day (BID) | ORAL | Status: DC
  Administered 2022-04-22: 6.25 mg via ORAL
  Filled 2022-04-22: qty 2

## 2022-04-22 MED ORDER — METHYLPREDNISOLONE SODIUM SUCC 125 MG IJ SOLR
125.0000 mg | Freq: Once | INTRAMUSCULAR | Status: AC
  Administered 2022-04-22: 125 mg via INTRAVENOUS
  Filled 2022-04-22: qty 2

## 2022-04-22 MED ORDER — MAGNESIUM SULFATE 2 GM/50ML IV SOLN
2.0000 g | Freq: Once | INTRAVENOUS | Status: AC
  Administered 2022-04-22: 2 g via INTRAVENOUS
  Filled 2022-04-22: qty 50

## 2022-04-22 MED ORDER — FUROSEMIDE 20 MG PO TABS
20.0000 mg | ORAL_TABLET | ORAL | Status: DC
  Administered 2022-04-22: 20 mg via ORAL
  Filled 2022-04-22: qty 1

## 2022-04-22 MED ORDER — IPRATROPIUM-ALBUTEROL 0.5-2.5 (3) MG/3ML IN SOLN
3.0000 mL | Freq: Once | RESPIRATORY_TRACT | Status: AC
  Administered 2022-04-22: 3 mL via RESPIRATORY_TRACT
  Filled 2022-04-22: qty 3

## 2022-04-22 MED ORDER — PREDNISONE 10 MG PO TABS: 40.0000 mg | ORAL_TABLET | Freq: Every day | ORAL | 0 refills | Status: AC

## 2022-04-22 MED ORDER — AMLODIPINE BESYLATE 5 MG PO TABS
10.0000 mg | ORAL_TABLET | Freq: Every day | ORAL | Status: DC
  Administered 2022-04-22: 10 mg via ORAL
  Filled 2022-04-22: qty 2

## 2022-04-22 NOTE — ED Notes (Signed)
Patient transported to X-ray 

## 2022-04-22 NOTE — Discharge Instructions (Signed)
A prescription for prednisone was sent to your pharmacy.  Take this as prescribed.  Continue utilizing your nebulized breathing treatments at home as needed.  Follow-up with your primary care doctor as soon as possible.  Return to the emergency department at any time if you have worsening symptoms.

## 2022-04-22 NOTE — ED Provider Notes (Signed)
[facility] EMERGENCY DEPARTMENT Provider Note   CSN: [id number] Arrival date & time: 04/22/22  4098     History  Chief Complaint  Patient presents with   Shortness of Breath   Dizziness    Jose [name] is a 61 y.o. male.   Shortness of Breath Dizziness Associated symptoms: shortness of breath   Patient presents for shortness of breath.  It has been persistent over the past 3 to 4 weeks.  He also endorses exertional dyspnea, lightheadedness with exertion, and orthopnea.  He has been utilizing breathing treatments at home. He has been seen at [facility] during this time.  He was most recently seen 5 days ago.  He was treated with doxycycline and prednisone taper.  He completed these medications 3 days ago.  Medical history includes HTN, polysubstance abuse, T2DM, CKD, CAD, HLD, depression, COPD.   He does not have a diagnosis of heart failure. He has history of stents. In early March, he was admitted for unstable angina.  He did undergo a cardiac cath at that time which showed multivessel disease.  Medical therapy was increased.  He states he has been taking Lasix every other day.    Home Medications Prior to Admission medications   Medication Sig Start Date End Date Taking? Authorizing Provider  albuterol (PROVENTIL HFA;VENTOLIN HFA) 108 (90 BASE) MCG/ACT inhaler Inhale 2 puffs into the lungs every 6 (six) hours as needed for wheezing or shortness of breath.    Yes [provider]  amLODipine (NORVASC) 10 MG tablet Take 1 tablet (10 mg total) by mouth daily. 03/12/21  Yes [name], Deloris Ping, MD  aspirin EC 81 MG tablet Take 81 mg by mouth daily. Swallow whole.   Yes [provider]  atorvastatin (LIPITOR) 80 MG tablet Take 1 tablet (80 mg total) by mouth daily. 01/30/22  Yes Marjie Skiff E, PA-C  brimonidine (ALPHAGAN) 0.2 % ophthalmic solution Place 1 drop into both eyes 2 (two) times daily.  04/11/15  Yes [provider]   budesonide-formoterol (SYMBICORT) 160-4.5 MCG/ACT inhaler Inhale 2 puffs into the lungs 2 (two) times daily.   Yes [provider]  carvedilol (COREG) 6.25 MG tablet Take 1 tablet (6.25 mg total) by mouth 2 (two) times daily with a meal. 01/29/22  Yes Marjie Skiff E, PA-C  clopidogrel (PLAVIX) 75 MG tablet Take 1 tablet (75 mg total) by mouth daily with breakfast. 01/30/22  Yes Irene Limbo, Callie E, PA-C  Cyanocobalamin (VITAMIN B-12 PO) Take 1 tablet by mouth daily.    Yes [provider]  doxazosin (CARDURA) 8 MG tablet Take 4 mg by mouth at bedtime. 04/15/20  Yes [provider]  furosemide (LASIX) 20 MG tablet Take 1 tablet by mouth every other day. 09/21/20  Yes [provider]  hydrALAZINE (APRESOLINE) 100 MG tablet Take 1 tablet (100 mg total) by mouth 3 (three) times daily. Patient taking differently: Take 100 mg by mouth 2 (two) times daily. 03/12/21  Yes [name], Deloris Ping, MD  HYDROcodone-acetaminophen (NORCO/VICODIN) 5-325 MG tablet Take 1 tablet by mouth every 4 (four) hours as needed. Patient taking differently: Take 1 tablet by mouth every 4 (four) hours as needed for severe pain. 05/28/21  Yes Jacalyn Lefevre, MD  hydrOXYzine (VISTARIL) 25 MG capsule Take 25 mg by mouth daily as needed for itching. 06/20/20  Yes [provider]  insulin glargine (LANTUS) 100 UNIT/ML injection Inject 0.3 mLs (30 Units total) into the skin daily before breakfast. Patient taking  differently: Inject 32 Units into the skin daily before breakfast. 08/30/17  Yes Zannie CoveJoseph, Preetha, MD  ipratropium-albuterol (DUONEB) 0.5-2.5 (3) MG/3ML SOLN Take 3 mLs by nebulization 4 (four) times daily as needed (shortness of breath/wheezing). 08/07/20  Yes [provider]  isosorbide mononitrate (IMDUR) 30 MG 24 hr tablet Take 1 tablet (30 mg total) by mouth daily. 01/30/22  Yes Irene LimboGoodrich, Callie E, PA-C  latanoprost (XALATAN) 0.005 % ophthalmic solution Place 1 drop into both eyes at  bedtime.  04/11/15  Yes [provider]  Magnesium Oxide 420 MG TABS Take 420 mg by mouth daily. 08/22/20  Yes [provider]  Multiple Vitamin (MULTIVITAMIN WITH MINERALS) TABS tablet Take 1 tablet by mouth daily.   Yes [provider]  nitroGLYCERIN (NITROSTAT) 0.4 MG SL tablet Place 1 tablet (0.4 mg total) under the tongue every 5 (five) minutes as needed for chest pain. 01/29/22  Yes Goodrich, Callie E, PA-C  Omega-3 1000 MG CAPS Take by mouth in the morning and at bedtime.   Yes [provider]  pantoprazole (PROTONIX) 40 MG tablet Take 1 tablet (40 mg total) by mouth daily. 01/29/22 04/29/22 Yes Goodrich, Callie E, PA-C  predniSONE (DELTASONE) 10 MG tablet Take 4 tablets (40 mg total) by mouth daily for 4 days. Starting tomorrow 04/22/22 04/26/22 Yes Gloris Manchesterixon, Danna Casella, MD  traZODone (DESYREL) 150 MG tablet Take 75 mg by mouth at bedtime.    Yes [provider]  glucose 4 GM chewable tablet Chew 4 g by mouth daily as needed for low blood sugar.  Patient not taking: Reported on 04/22/2022    [provider]  polyethylene glycol powder (GLYCOLAX/MIRALAX) 17 GM/SCOOP powder Take 17 g by mouth daily as needed for mild constipation. Patient not taking: Reported on 04/22/2022 12/18/20   [provider]  sildenafil (VIAGRA) 100 MG tablet Take 100 mg by mouth as needed for erectile dysfunction. Patient not taking: Reported on 04/22/2022    [provider]  tamsulosin (FLOMAX) 0.4 MG CAPS capsule Take 1 capsule by mouth at bedtime. Patient not taking: Reported on 04/22/2022 01/21/21   [provider]      Allergies    Lisinopril-hydrochlorothiazide and Simvastatin    Review of Systems   Review of Systems  Constitutional:  Positive for fatigue.  Respiratory:  Positive for shortness of breath.   Neurological:  Positive for dizziness and light-headedness.  All other systems reviewed and are negative.  Physical Exam Updated Vital  Signs BP (!) 191/97   Pulse 91   Temp (!) 97.5 F (36.4 C) (Oral)   Resp 17   Ht 5\' 11"  (1.803 m)   Wt 79.8 kg   SpO2 98%   BMI 24.55 kg/m  Physical Exam Vitals and nursing note reviewed.  Constitutional:      General: He is not in acute distress.    Appearance: He is well-developed and normal weight. He is not toxic-appearing or diaphoretic.  HENT:     Head: Normocephalic and atraumatic.     Mouth/Throat:     Mouth: Mucous membranes are moist.  Eyes:     Extraocular Movements: Extraocular movements intact.     Conjunctiva/sclera: Conjunctivae normal.  Neck:     Vascular: No JVD.  Cardiovascular:     Rate and Rhythm: Normal rate and regular rhythm.     Heart sounds: No murmur heard. Pulmonary:     Effort: No tachypnea, accessory muscle usage or respiratory distress.     Breath sounds: Wheezing  present. No decreased breath sounds or rales.     Comments: Mildly increased work of breathing.  SPO2 100% on room air. Chest:     Chest wall: No tenderness.  Abdominal:     Palpations: Abdomen is soft.     Tenderness: There is no abdominal tenderness.  Musculoskeletal:        General: No swelling. Normal range of motion.     Cervical back: Normal range of motion and neck supple.     Right lower leg: No edema.     Left lower leg: No edema.  Skin:    General: Skin is warm and dry.     Coloration: Skin is not cyanotic or pale.  Neurological:     General: No focal deficit present.     Mental Status: He is alert and oriented to person, place, and time.  Psychiatric:        Mood and Affect: Mood normal.        Behavior: Behavior normal.    ED Results / Procedures / Treatments   Labs (all labs ordered are listed, but only abnormal results are displayed) Labs Reviewed  CBC WITH DIFFERENTIAL/PLATELET - Abnormal; Notable for the following components:      Result Value   HCT 36.5 (*)    MCV 75.9 (*)    MCHC 37.3 (*)    All other components within normal limits  MAGNESIUM -  Abnormal; Notable for the following components:   Magnesium 1.2 (*)    All other components within normal limits  BASIC METABOLIC PANEL - Abnormal; Notable for the following components:   Sodium 131 (*)    Glucose, Bld 297 (*)    BUN 25 (*)    Creatinine, Ser 1.87 (*)    GFR, Estimated 40 (*)    All other components within normal limits  URINALYSIS, ROUTINE W REFLEX MICROSCOPIC - Abnormal; Notable for the following components:   Glucose, UA 150 (*)    Protein, ur 30 (*)    All other components within normal limits  RAPID URINE DRUG SCREEN, HOSP PERFORMED - Abnormal; Notable for the following components:   Tetrahydrocannabinol POSITIVE (*)    All other components within normal limits  TROPONIN I (HIGH SENSITIVITY) - Abnormal; Notable for the following components:   Troponin I (High Sensitivity) 33 (*)    All other components within normal limits  TROPONIN I (HIGH SENSITIVITY) - Abnormal; Notable for the following components:   Troponin I (High Sensitivity) 26 (*)    All other components within normal limits  RESP PANEL BY RT-PCR (FLU A&B, COVID) ARPGX2  BRAIN NATRIURETIC PEPTIDE  HEPATIC FUNCTION PANEL  D-DIMER, QUANTITATIVE (NOT AT [facility])    EKG EKG Interpretation  Date/Time:  Tuesday Apr 22 2022 08:23:36 EDT Ventricular Rate:  79 PR Interval:  178 QRS Duration: 98 QT Interval:  374 QTC Calculation: 428 R Axis:   71 Text Interpretation: Sinus rhythm with frequent Premature ventricular complexes Septal infarct , age undetermined Abnormal ECG Confirmed by Gloris Manchester [phone]) on 04/22/2022 8:43:50 AM  Radiology DG Chest 2 View  Result Date: 04/22/2022 CLINICAL DATA:  Shortness of breath for 3 weeks. Diabetes. Hypertension. COPD. Ex-smoker. EXAM: CHEST - 2 VIEW COMPARISON:  01/27/2022 FINDINGS: Numerous leads and wires project over the chest. Midline trachea. Normal heart size and mediastinal contours. No pleural effusion or pneumothorax. Clear lungs. IMPRESSION: No acute  cardiopulmonary disease. Electronically Signed   By: Jeronimo Greaves M.D.   On: 04/22/2022 09:09  Procedures Procedures    Medications Ordered in ED Medications  amLODipine (NORVASC) tablet 10 mg (has no administration in time range)  carvedilol (COREG) tablet 6.25 mg (has no administration in time range)  furosemide (LASIX) tablet 20 mg (has no administration in time range)  ipratropium-albuterol (DUONEB) 0.5-2.5 (3) MG/3ML nebulizer solution 3 mL (3 mLs Nebulization Given 04/22/22 0922)  methylPREDNISolone sodium succinate (SOLU-MEDROL) 125 mg/2 mL injection 125 mg (125 mg Intravenous Given 04/22/22 0924)  magnesium sulfate IVPB 2 g 50 mL (0 g Intravenous Stopped 04/22/22 1239)    ED Course/ Medical Decision Making/ A&P                           Medical Decision Making Amount and/or Complexity of Data Reviewed Labs: ordered. Radiology: ordered.  Risk Prescription drug management.   This patient presents to the ED for concern of shortness of breath, this involves an extensive number of treatment options, and is a complaint that carries with it a high risk of complications and morbidity.  The differential diagnosis includes CHF, pneumonia, COPD exacerbation, PE,   Co morbidities that complicate the patient evaluation  HTN, polysubstance abuse, T2DM, CKD, CAD, HLD, depression, COPD   Additional history obtained:  Additional history obtained from N/A External records from outside source obtained and reviewed including EMR   Lab Tests:  I Ordered, and personally interpreted labs.  The pertinent results include: Baseline mild elevation in troponins with downtrend on the delta; baseline CKD, moderate hyperglycemia without acidosis; normal hemoglobin, no leukocytosis, hypomagnesemia with otherwise normal electrolytes   Imaging Studies ordered:  I ordered imaging studies including chest x-ray I independently visualized and interpreted imaging which showed no acute findings I  agree with the radiologist interpretation   Cardiac Monitoring: / EKG:  The patient was maintained on a cardiac monitor.  I personally viewed and interpreted the cardiac monitored which showed an underlying rhythm of: Sinus rhythm   Problem List / ED Course / Critical interventions / Medication management  Patient is a 61 year old male who presents for acute on chronic shortness of breath.  He reports ongoing shortness of breath over the past month.  The symptoms seem to be worsened with exertion as well as laying flat.  He is followed by his primary care doctor at the [place].  He was admitted to the hospital here 3 months ago for unstable angina and underwent a heart cath and echocardiogram.  On arrival in the ED, vital signs are notable for hypertension.  SPO2 is normal on room air.  His breathing is mildly labored.  On lung auscultation, there is evidence of expiratory wheezing.  Patient was given DuoNeb and Solu-Medrol for treatment of COPD exacerbation.  He underwent diagnostic work-up which showed pretty significant hypomagnesemia.  IV magnesium was provided.  Other laboratory results are reassuring.  Patient has baseline mild elevation in troponin with downtrend on the repeat.  His sugar is mildly elevated without evidence of acidosis.  Patient has a normal hemoglobin and no leukocytosis to suggest infection.  On chest x-ray, there are no areas of opacities and there is no evidence of pulmonary edema.  BNP is also normal.  Following breathing treatment, patient reported significant improvement in symptoms.  Lungs are now clear to auscultation.  This improvement was sustained for 1 hour posttreatment I suspect that the patient has a COPD exacerbation.  Short course of prednisone was prescribed and patient was advised to continue nebulized breathing treatments  at home.  He was encouraged to return at any time if he does have any worsening of symptoms.  Patient was discharged in good condition. I ordered  medication including DuoNeb and Solu-Medrol for COPD exacerbation; magnesium sulfate for hypomagnesemia Reevaluation of the patient after these medicines showed that the patient resolved I have reviewed the patients home medicines and have made adjustments as needed   Social Determinants of Health:  Has access to outpatient care through the [facility]         Final Clinical Impression(s) / ED Diagnoses Final diagnoses:  COPD exacerbation (HCC)    Rx / DC Orders ED Discharge Orders          Ordered    predniSONE (DELTASONE) 10 MG tablet  Daily        04/22/22 1340              Gloris Manchester, MD 04/22/22 1347

## 2022-04-22 NOTE — ED Triage Notes (Signed)
Pt. Stated, Jose [name] not been able to breath good for 3 weeks. Ive gone to [facility] hospital and they gave me prednisone and an antibiotic and I still cant breath.

## 2022-06-09 IMAGING — CT CT ANGIO CHEST
2 of 6 series · 18 of 46 positions shown · IV contrast (APPLIED)
Comparison: None.

CLINICAL DATA: Pulmonary embolism, chest pain, dyspnea

EXAM:
CT ANGIOGRAPHY CHEST WITH CONTRAST
TECHNIQUE: Multidetector CT imaging of the chest was performed using the
standard protocol during bolus administration of intravenous
contrast. Multiplanar CT image reconstructions and MIPs were
obtained to evaluate the vascular anatomy.
CONTRAST:  100mL OMNIPAQUE IOHEXOL 350 MG/ML SOLN

[Series 6: thins · axial · 0.72mm/px · z∈[-309,-30]mm · 15 of 437 slices shown]
[im 19/437  lung]
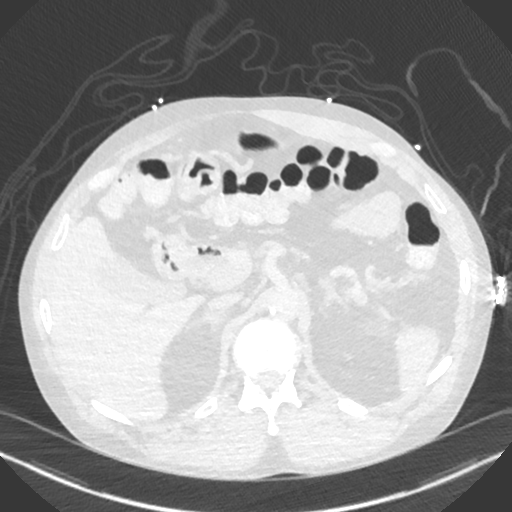
[im 57/437  soft-tissue]
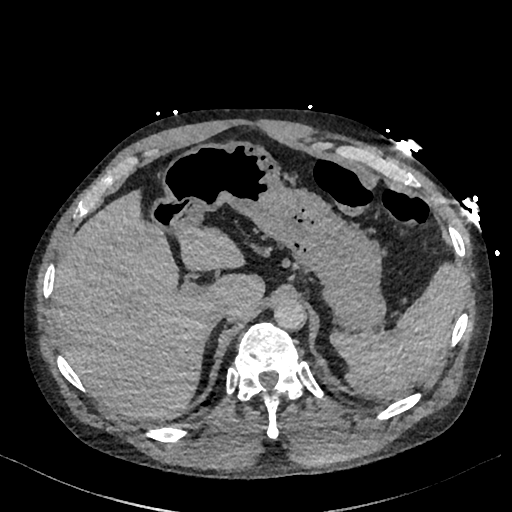
[im 76/437  lung]
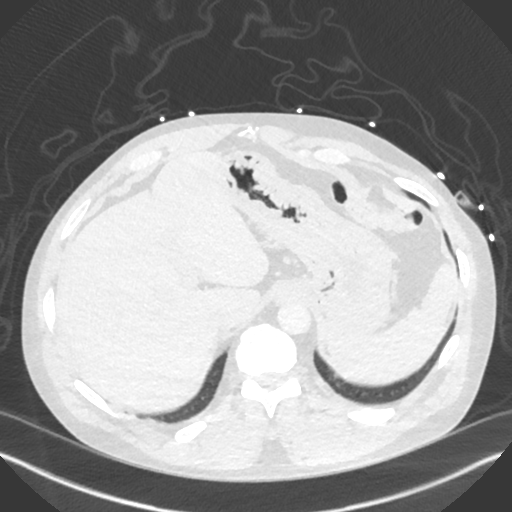
[im 114/437  soft-tissue]
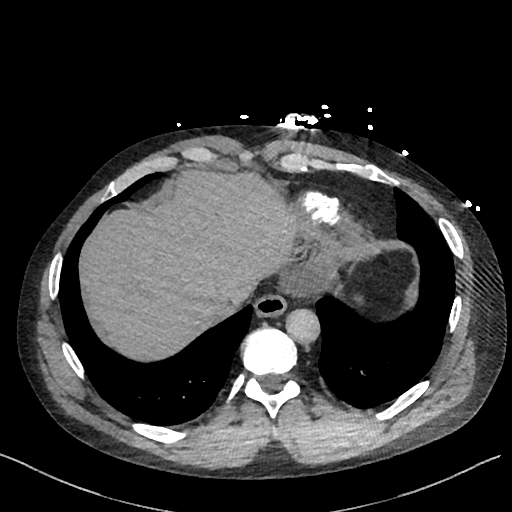
[im 133/437  lung]
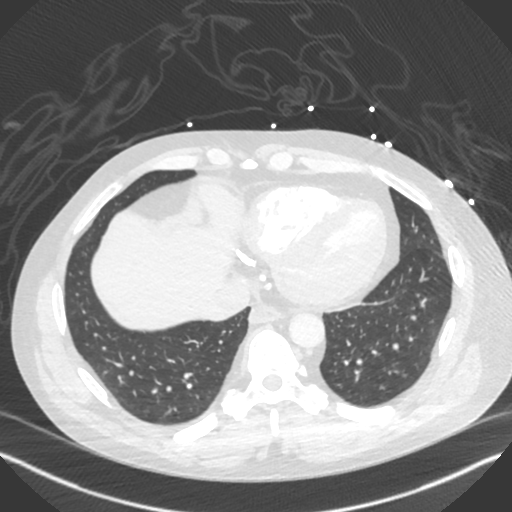
[im 171/437  soft-tissue]
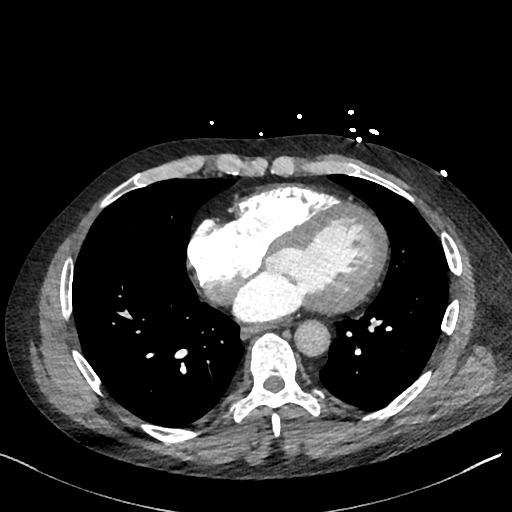
[im 190/437  lung]
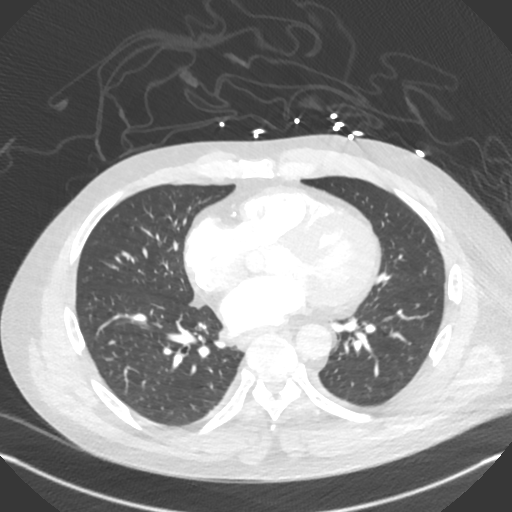
[im 228/437  soft-tissue]
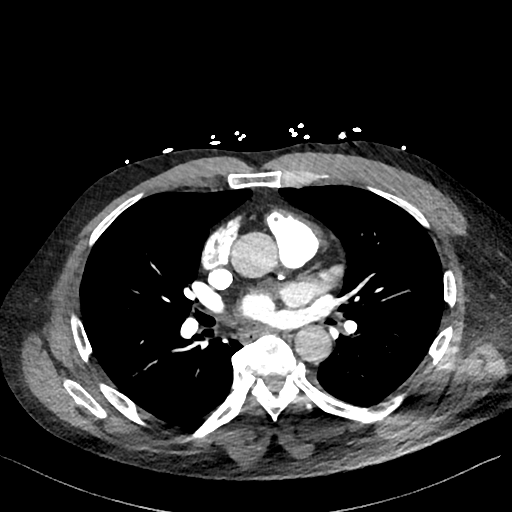
[im 247/437  lung]
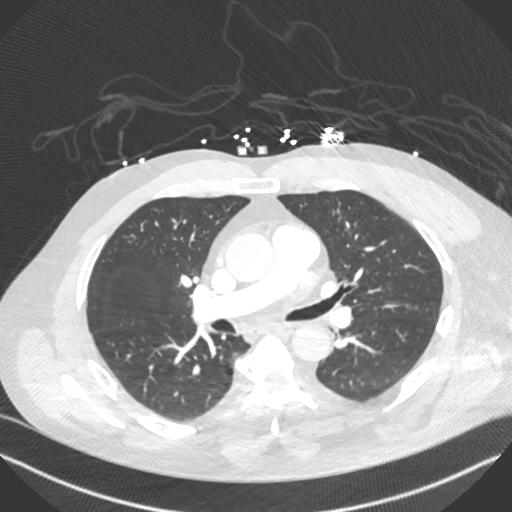
[im 266/437  soft-tissue]
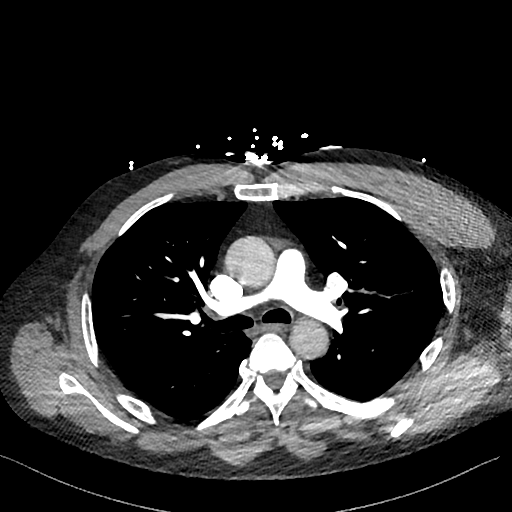
[im 304/437  lung]
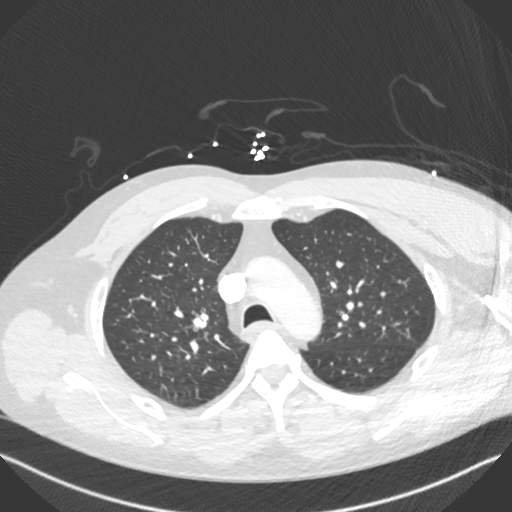
[im 323/437  soft-tissue]
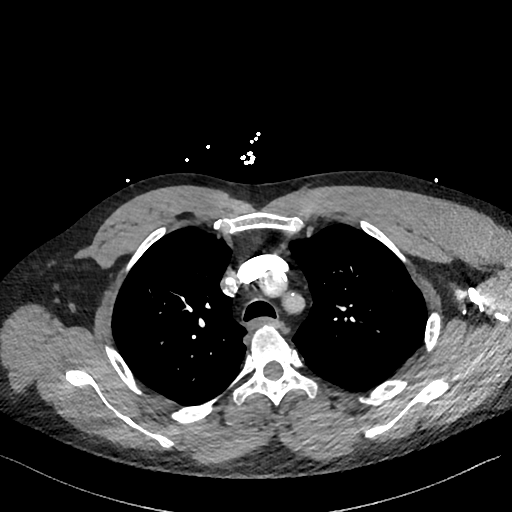
[im 361/437  lung]
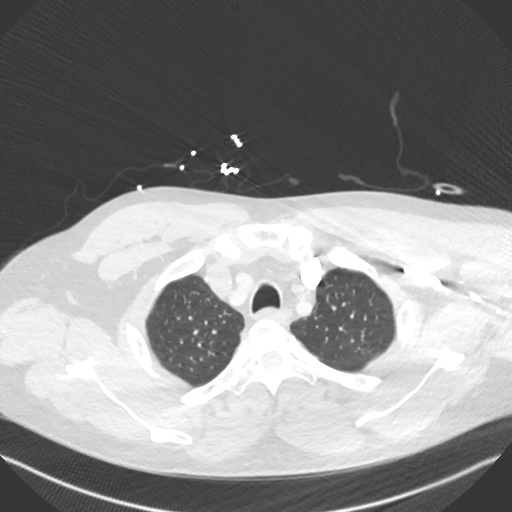
[im 380/437  soft-tissue]
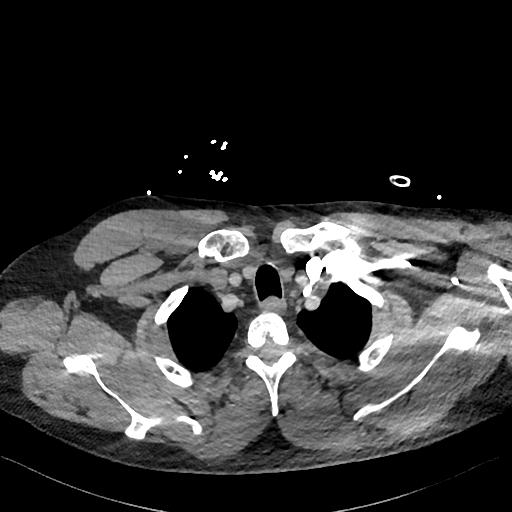
[im 418/437  lung]
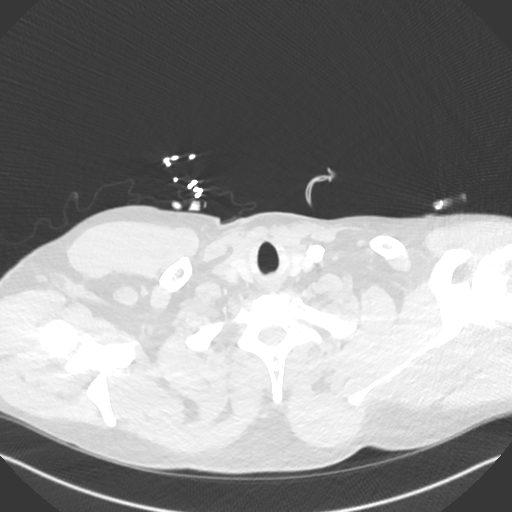

[Series 8: cor · coronal · 0.59mm/px · 3 of 139 slices shown]
[im 35/139  soft-tissue]
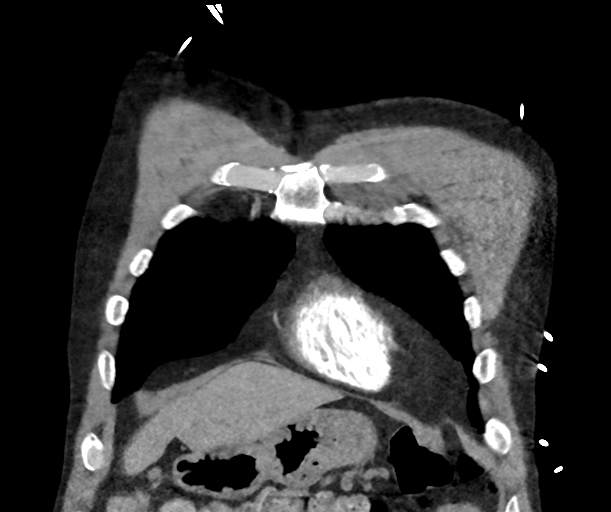
[im 70/139  soft-tissue]
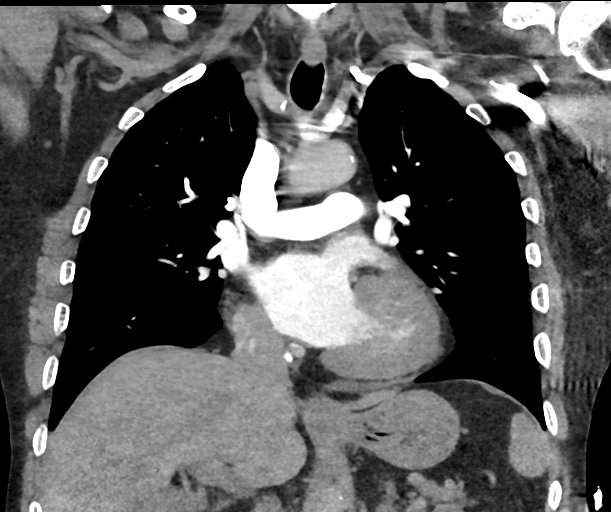
[im 104/139  soft-tissue]
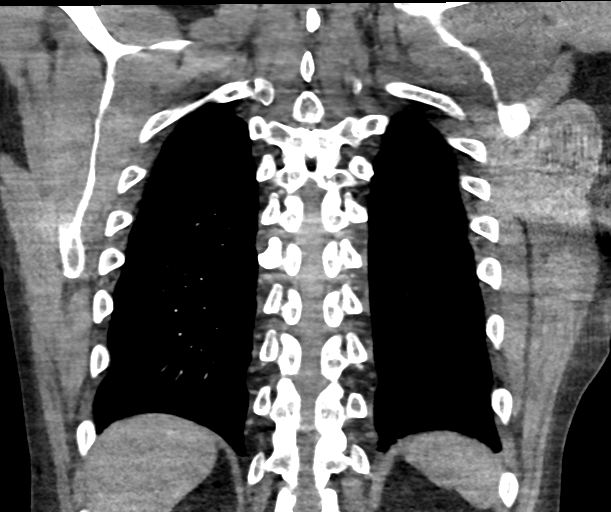

[18 of 46 positions shown; findings below may reference images not displayed]

FINDINGS: Cardiovascular: Satisfactory opacification of the pulmonary arteries
to the segmental level. There are several small webs identified
within the right lower lobar pulmonary artery which may reflect the
residua of prior pulmonary embolisms. There is no intraluminal
filling defect identified, however, to suggest acute pulmonary
embolization. The central pulmonary arteries are of normal caliber.
Extensive coronary artery calcification is noted. Probable stenting
of the of the left anterior descending and distal right coronary
arteries. Global cardiac size is within normal limits. No
pericardial effusion. Thoracic aorta is unremarkable.

Mediastinum/Nodes: No enlarged mediastinal, hilar, or axillary lymph
nodes. Thyroid gland, trachea, and esophagus demonstrate no
significant findings.

Lungs/Pleura: There is subtle bilateral upper lobe centrilobular
nodularity and mild bronchial wall thickening. In the acute setting,
this could be seen in atypical infection. If chronic, this can be
seen with smoking related disease (EDRION) or hypersensitivity
pneumonitis. No confluent pulmonary infiltrates are seen. No
pneumothorax or pleural effusion. No central obstructing lesion.

Upper Abdomen: Infiltrative soft tissue surrounding the distal
common and proximal proper hepatic arteries as well as incompletely
evaluated cystic lesion within the tail of the pancreas are
unchanged from prior CT examination of the abdomen of 04/27/2019 and
may represent the sequela of prior pancreatitis. This is not well
assessed on this examination.

Musculoskeletal: No acute bone abnormality

Review of the MIP images confirms the above findings.
IMPRESSION: No acute pulmonary embolism. Probable sequela of prior pulmonary
embolization.

Subtle centrilobular nodularity and airway inflammation. See
differential considerations above.

Changes within the upper abdomen possibly related to prior
pancreatitis. This is incompletely evaluated on this examination.

Extensive coronary artery calcification.

## 2022-06-09 IMAGING — CR DG CHEST 2V
2 series · 2 of 2 positions shown · non-contrast
Comparison: 05/13/2019

CLINICAL DATA: Chest pain

EXAM:
CHEST - 2 VIEW

[chest lat]
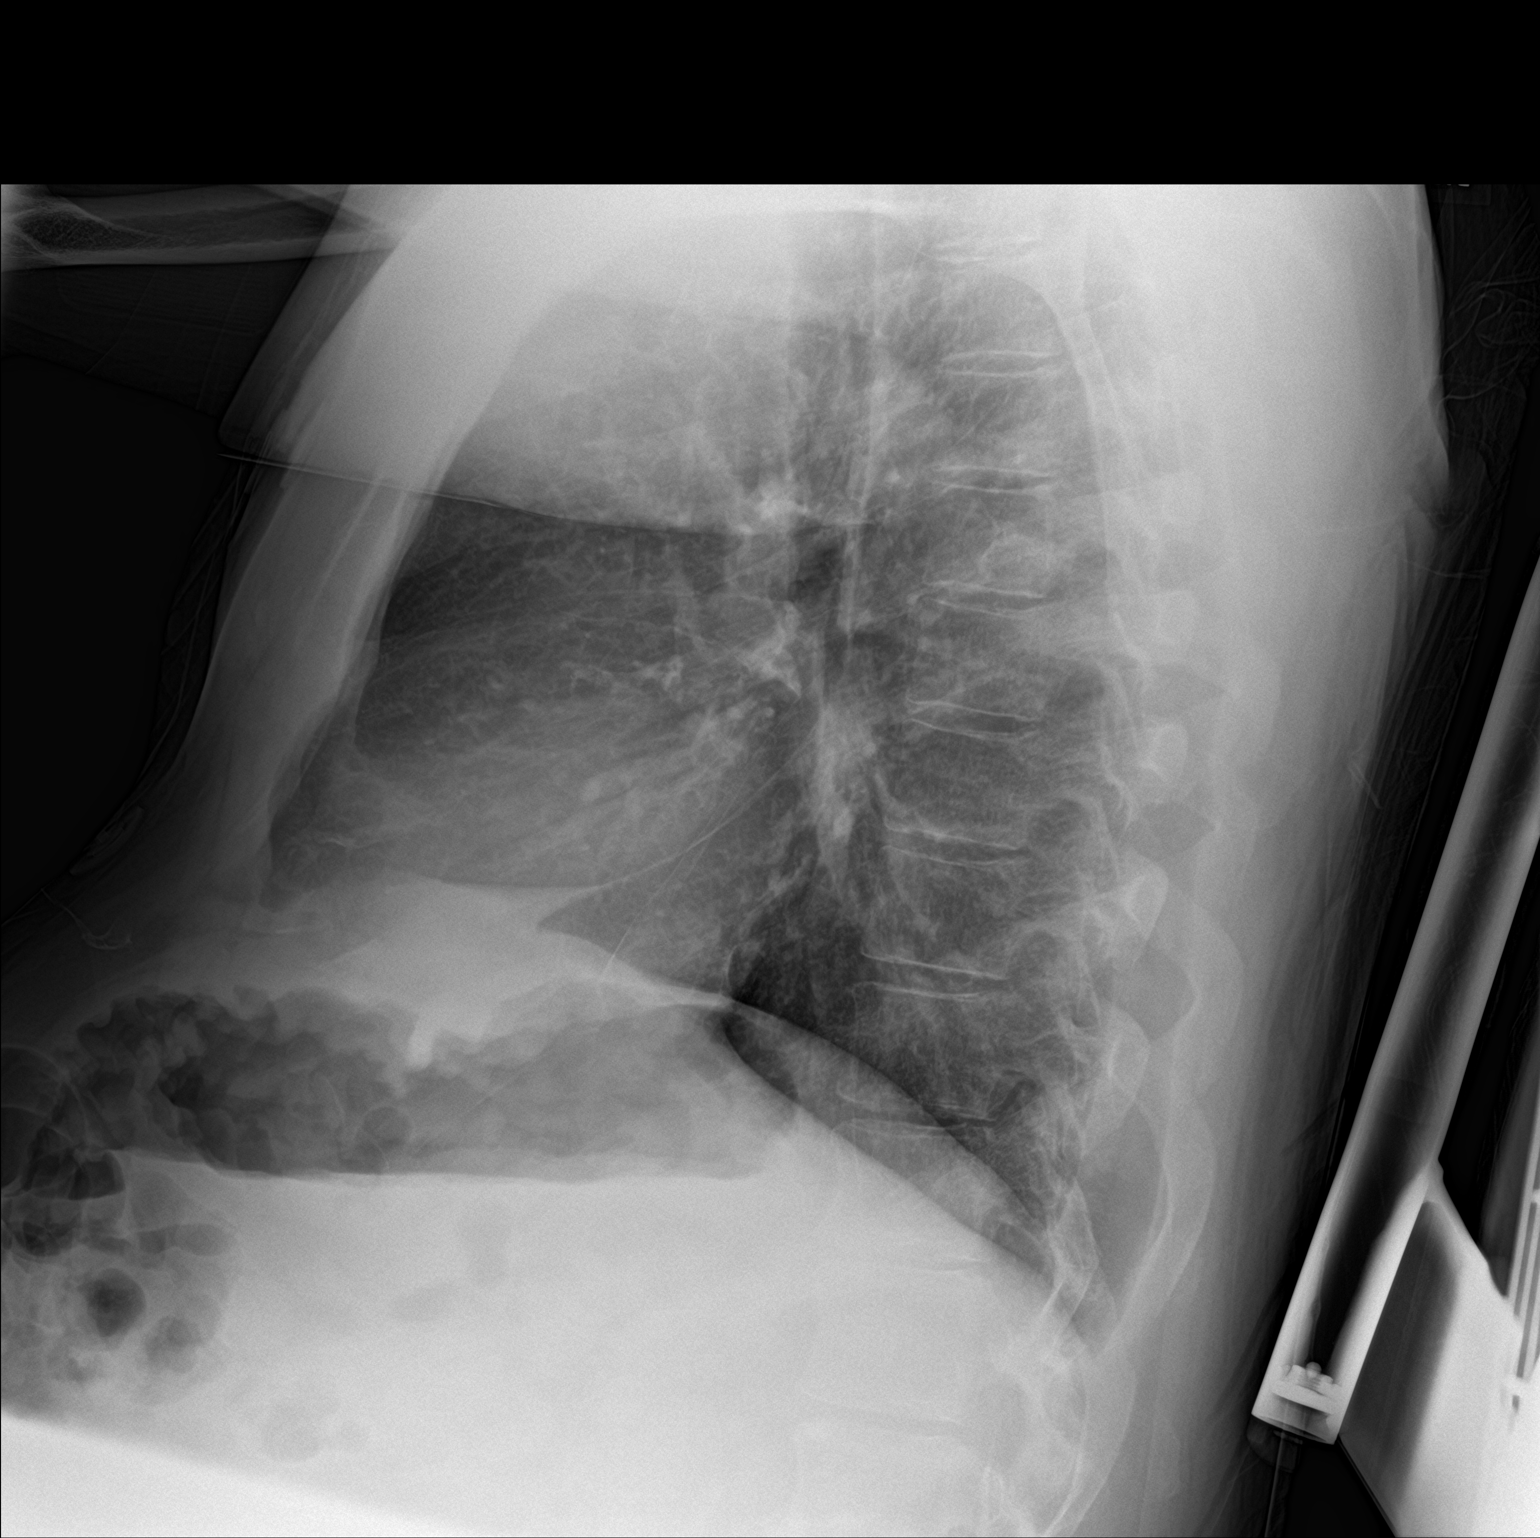

[chest ap]
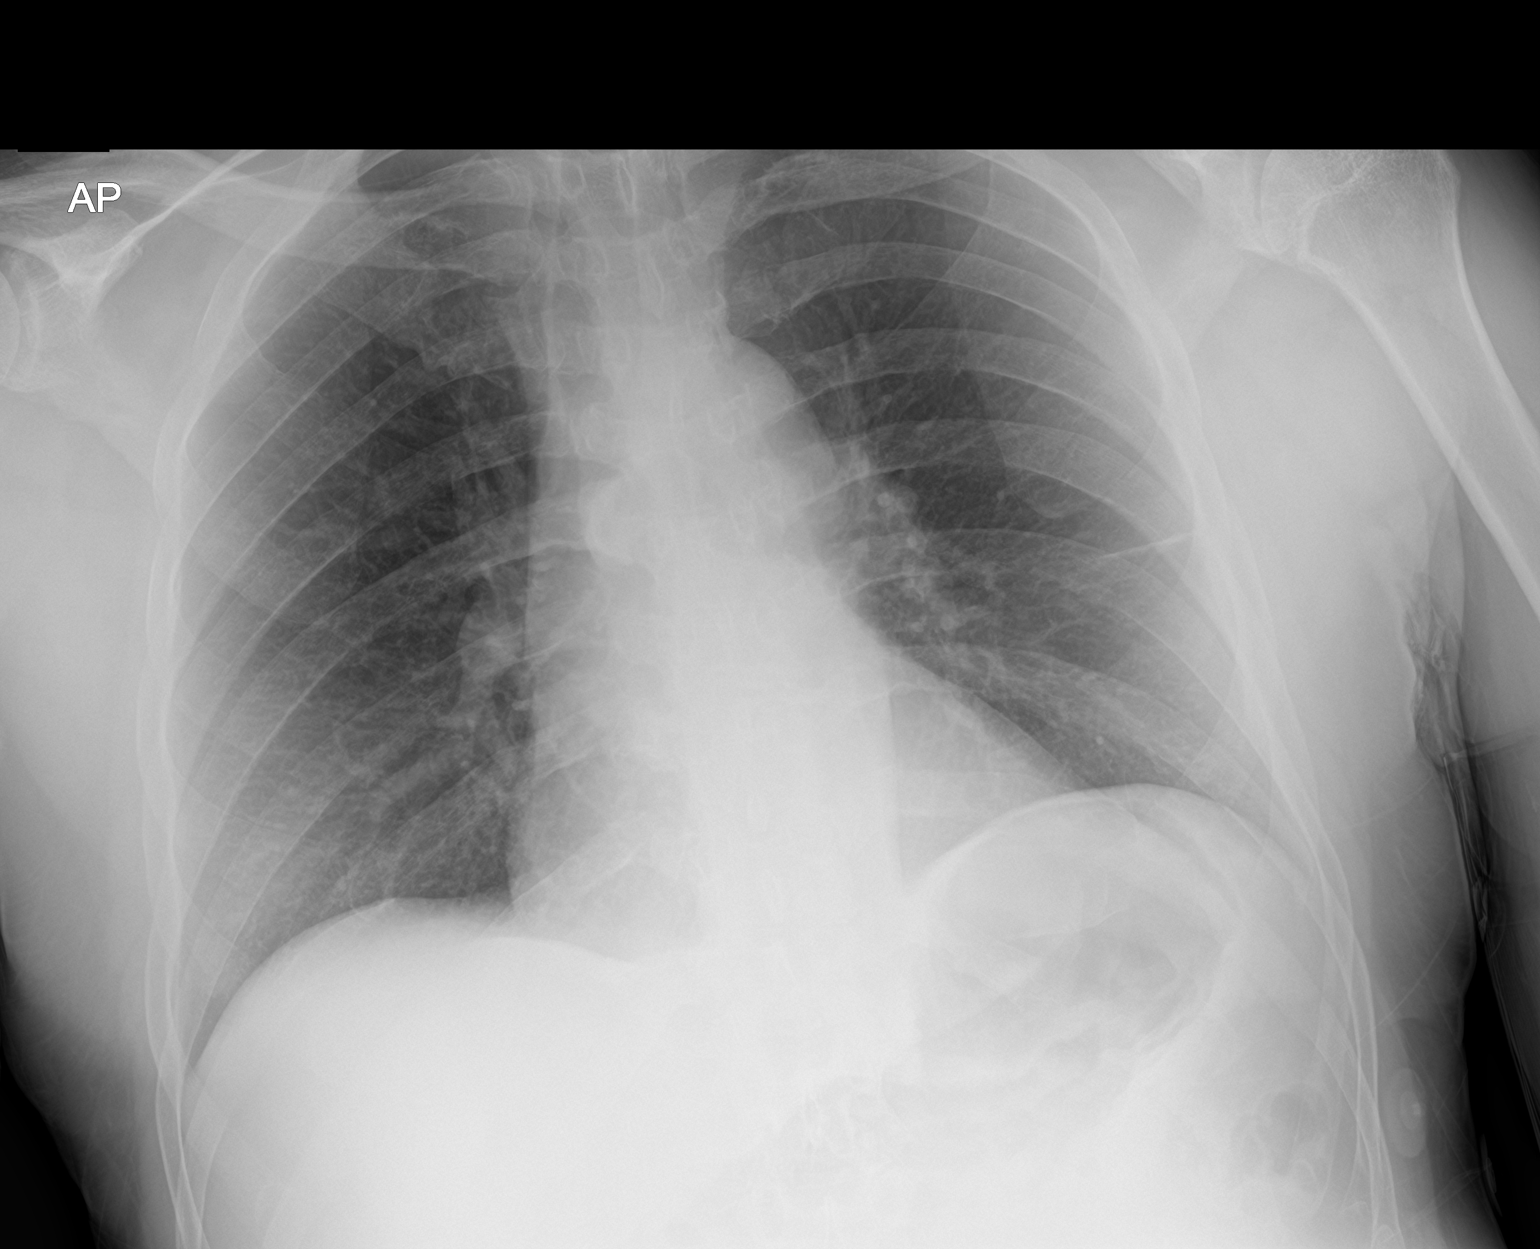

[2 of 2 positions shown; findings below may reference images not displayed]

FINDINGS: The heart size and mediastinal contours are within normal limits. No
acute airspace opacity. Unchanged, minimal bandlike scarring of the
left midlung. The visualized skeletal structures are unremarkable.
IMPRESSION: No acute abnormality of the lungs. Unchanged, minimal bandlike
scarring of the left midlung.

## 2022-06-23 ENCOUNTER — Ambulatory Visit: Payer: No Typology Code available for payment source | Admitting: Interventional Cardiology

## 2022-07-07 DIAGNOSIS — R6889 Other general symptoms and signs: Secondary | ICD-10-CM | POA: Diagnosis not present

## 2022-07-25 ENCOUNTER — Ambulatory Visit: Payer: Self-pay

## 2022-08-11 ENCOUNTER — Inpatient Hospital Stay
Admission: RE | Admit: 2022-08-11 | Payer: No Typology Code available for payment source | Source: Intra-hospital | Admitting: Psychiatry

## 2022-08-11 ENCOUNTER — Ambulatory Visit (HOSPITAL_COMMUNITY)
Admission: EM | Admit: 2022-08-11 | Discharge: 2022-08-11 | Disposition: A | Discharge status: Home / Self Care | Payer: No Typology Code available for payment source | Attending: Family Medicine | Admitting: Family Medicine

## 2022-08-11 ENCOUNTER — Encounter (HOSPITAL_COMMUNITY): Payer: Self-pay

## 2022-08-11 ENCOUNTER — Other Ambulatory Visit: Payer: Self-pay

## 2022-08-11 ENCOUNTER — Emergency Department (HOSPITAL_COMMUNITY)
Admission: EM | Admit: 2022-08-11 | Discharge: 2022-08-14 | Disposition: A | Discharge status: Home / Self Care | Payer: No Typology Code available for payment source | Attending: Emergency Medicine | Admitting: Emergency Medicine

## 2022-08-11 DIAGNOSIS — Z7982 Long term (current) use of aspirin: Secondary | ICD-10-CM | POA: Insufficient documentation

## 2022-08-11 DIAGNOSIS — E871 Hypo-osmolality and hyponatremia: Secondary | ICD-10-CM | POA: Diagnosis not present

## 2022-08-11 DIAGNOSIS — F4312 Post-traumatic stress disorder, chronic: Secondary | ICD-10-CM | POA: Insufficient documentation

## 2022-08-11 DIAGNOSIS — R45851 Suicidal ideations: Secondary | ICD-10-CM

## 2022-08-11 DIAGNOSIS — Z20822 Contact with and (suspected) exposure to covid-19: Secondary | ICD-10-CM | POA: Insufficient documentation

## 2022-08-11 DIAGNOSIS — I2510 Atherosclerotic heart disease of native coronary artery without angina pectoris: Secondary | ICD-10-CM | POA: Insufficient documentation

## 2022-08-11 DIAGNOSIS — F1024 Alcohol dependence with alcohol-induced mood disorder: Secondary | ICD-10-CM | POA: Insufficient documentation

## 2022-08-11 DIAGNOSIS — E1165 Type 2 diabetes mellitus with hyperglycemia: Secondary | ICD-10-CM | POA: Insufficient documentation

## 2022-08-11 DIAGNOSIS — F1094 Alcohol use, unspecified with alcohol-induced mood disorder: Secondary | ICD-10-CM | POA: Diagnosis not present

## 2022-08-11 DIAGNOSIS — F332 Major depressive disorder, recurrent severe without psychotic features: Secondary | ICD-10-CM | POA: Diagnosis present

## 2022-08-11 DIAGNOSIS — F333 Major depressive disorder, recurrent, severe with psychotic symptoms: Secondary | ICD-10-CM | POA: Diagnosis not present

## 2022-08-11 DIAGNOSIS — R7989 Other specified abnormal findings of blood chemistry: Secondary | ICD-10-CM | POA: Diagnosis not present

## 2022-08-11 DIAGNOSIS — Z7902 Long term (current) use of antithrombotics/antiplatelets: Secondary | ICD-10-CM | POA: Diagnosis not present

## 2022-08-11 DIAGNOSIS — F515 Nightmare disorder: Secondary | ICD-10-CM

## 2022-08-11 DIAGNOSIS — F1090 Alcohol use, unspecified, uncomplicated: Secondary | ICD-10-CM

## 2022-08-11 DIAGNOSIS — Z79899 Other long term (current) drug therapy: Secondary | ICD-10-CM | POA: Insufficient documentation

## 2022-08-11 DIAGNOSIS — Z794 Long term (current) use of insulin: Secondary | ICD-10-CM | POA: Diagnosis not present

## 2022-08-11 DIAGNOSIS — I10 Essential (primary) hypertension: Secondary | ICD-10-CM | POA: Insufficient documentation

## 2022-08-11 DIAGNOSIS — Z046 Encounter for general psychiatric examination, requested by authority: Secondary | ICD-10-CM | POA: Diagnosis present

## 2022-08-11 DIAGNOSIS — F4310 Post-traumatic stress disorder, unspecified: Secondary | ICD-10-CM

## 2022-08-11 DIAGNOSIS — R739 Hyperglycemia, unspecified: Secondary | ICD-10-CM

## 2022-08-11 LAB — CBC WITH DIFFERENTIAL/PLATELET
Abs Immature Granulocytes: 0.02 10*3/uL (ref 0.00–0.07)
Abs Immature Granulocytes: 0.01 10*3/uL (ref 0.00–0.07)
Basophils Absolute: 0 10*3/uL (ref 0.0–0.1)
Basophils Absolute: 0.1 10*3/uL (ref 0.0–0.1)
Basophils Relative: 1 %
Basophils Relative: 1 %
Eosinophils Absolute: 0.2 10*3/uL (ref 0.0–0.5)
Eosinophils Absolute: 0.3 10*3/uL (ref 0.0–0.5)
Eosinophils Relative: 4 %
Eosinophils Relative: 4 %
HCT: 52 % (ref 39.0–52.0)
HCT: 40 % (ref 39.0–52.0)
Hemoglobin: 19.2 g/dL — ABNORMAL HIGH (ref 13.0–17.0)
Hemoglobin: 15 g/dL (ref 13.0–17.0)
Immature Granulocytes: 0 %
Immature Granulocytes: 0 %
Lymphocytes Relative: 30 %
Lymphocytes Relative: 36 %
Lymphs Abs: 1.8 10*3/uL (ref 0.7–4.0)
Lymphs Abs: 2.6 10*3/uL (ref 0.7–4.0)
MCH: 28 pg (ref 26.0–34.0)
MCH: 28.4 pg (ref 26.0–34.0)
MCHC: 36.9 g/dL — ABNORMAL HIGH (ref 30.0–36.0)
MCHC: 37.5 g/dL — ABNORMAL HIGH (ref 30.0–36.0)
MCV: 75.8 fL — ABNORMAL LOW (ref 80.0–100.0)
MCV: 75.8 fL — ABNORMAL LOW (ref 80.0–100.0)
Monocytes Absolute: 0.3 10*3/uL (ref 0.1–1.0)
Monocytes Absolute: 0.6 10*3/uL (ref 0.1–1.0)
Monocytes Relative: 6 %
Monocytes Relative: 8 %
Neutro Abs: 3.5 10*3/uL (ref 1.7–7.7)
Neutro Abs: 3.7 10*3/uL (ref 1.7–7.7)
Neutrophils Relative %: 59 %
Neutrophils Relative %: 51 %
Platelets: 109 10*3/uL — ABNORMAL LOW (ref 150–400)
Platelets: 163 10*3/uL (ref 150–400)
RBC: 6.86 MIL/uL — ABNORMAL HIGH (ref 4.22–5.81)
RBC: 5.28 MIL/uL (ref 4.22–5.81)
RDW: 13.8 % (ref 11.5–15.5)
RDW: 13.3 % (ref 11.5–15.5)
WBC: 5.9 10*3/uL (ref 4.0–10.5)
WBC: 7.2 10*3/uL (ref 4.0–10.5)
nRBC: 0 % (ref 0.0–0.2)
nRBC: 0 % (ref 0.0–0.2)

## 2022-08-11 LAB — COMPREHENSIVE METABOLIC PANEL
ALT: 26 U/L (ref 0–44)
ALT: 25 U/L (ref 0–44)
AST: 26 U/L (ref 15–41)
AST: 25 U/L (ref 15–41)
Albumin: 4.5 g/dL (ref 3.5–5.0)
Albumin: 4.3 g/dL (ref 3.5–5.0)
Alkaline Phosphatase: 65 U/L (ref 38–126)
Alkaline Phosphatase: 60 U/L (ref 38–126)
Anion gap: 14 (ref 5–15)
Anion gap: 12 (ref 5–15)
BUN: 22 mg/dL (ref 8–23)
BUN: 24 mg/dL — ABNORMAL HIGH (ref 8–23)
CO2: 21 mmol/L — ABNORMAL LOW (ref 22–32)
CO2: 16 mmol/L — ABNORMAL LOW (ref 22–32)
Calcium: 10.2 mg/dL (ref 8.9–10.3)
Calcium: 9.8 mg/dL (ref 8.9–10.3)
Chloride: 91 mmol/L — ABNORMAL LOW (ref 98–111)
Chloride: 95 mmol/L — ABNORMAL LOW (ref 98–111)
Creatinine, Ser: 1.84 mg/dL — ABNORMAL HIGH (ref 0.61–1.24)
Creatinine, Ser: 1.72 mg/dL — ABNORMAL HIGH (ref 0.61–1.24)
GFR, Estimated: 41 mL/min — ABNORMAL LOW (ref >60)
GFR, Estimated: 44 mL/min — ABNORMAL LOW (ref >60)
Glucose, Bld: 366 mg/dL — ABNORMAL HIGH (ref 70–99)
Glucose, Bld: 399 mg/dL — ABNORMAL HIGH (ref 70–99)
Potassium: 4.1 mmol/L (ref 3.5–5.1)
Potassium: 4.1 mmol/L (ref 3.5–5.1)
Sodium: 126 mmol/L — ABNORMAL LOW (ref 135–145)
Sodium: 123 mmol/L — ABNORMAL LOW (ref 135–145)
Total Bilirubin: 0.7 mg/dL (ref 0.3–1.2)
Total Bilirubin: 0.5 mg/dL (ref 0.3–1.2)
Total Protein: 7.3 g/dL (ref 6.5–8.1)
Total Protein: 7.1 g/dL (ref 6.5–8.1)

## 2022-08-11 LAB — ETHANOL: Alcohol, Ethyl (B): <10 mg/dL (ref <10)

## 2022-08-11 LAB — MAGNESIUM: Magnesium: 1.3 mg/dL — ABNORMAL LOW (ref 1.7–2.4)

## 2022-08-11 LAB — POCT URINE DRUG SCREEN - MANUAL ENTRY (I-SCREEN)
POC Amphetamine UR: NOT DETECTED
POC Buprenorphine (BUP): NOT DETECTED
POC Cocaine UR: NOT DETECTED
POC Marijuana UR: POSITIVE — AB
POC Methadone UR: NOT DETECTED
POC Methamphetamine UR: NOT DETECTED
POC Morphine: NOT DETECTED
POC Oxazepam (BZO): NOT DETECTED
POC Oxycodone UR: NOT DETECTED
POC Secobarbital (BAR): NOT DETECTED

## 2022-08-11 LAB — LIPID PANEL
Cholesterol: 145 mg/dL (ref 0–200)
HDL: 61 mg/dL (ref >40)
LDL Cholesterol: 49 mg/dL (ref 0–99)
Total CHOL/HDL Ratio: 2.4 RATIO
Triglycerides: 174 mg/dL — ABNORMAL HIGH (ref <150)
VLDL: 35 mg/dL (ref 0–40)

## 2022-08-11 LAB — HEMOGLOBIN A1C
Hgb A1c MFr Bld: 7.6 % — ABNORMAL HIGH (ref 4.8–5.6)
Mean Plasma Glucose: 171.42 mg/dL

## 2022-08-11 LAB — RESP PANEL BY RT-PCR (FLU A&B, COVID) ARPGX2
Influenza A by PCR: NEGATIVE
Influenza B by PCR: NEGATIVE
SARS Coronavirus 2 by RT PCR: NEGATIVE

## 2022-08-11 LAB — TSH: TSH: 2.797 u[IU]/mL (ref 0.350–4.500)

## 2022-08-11 LAB — POC SARS CORONAVIRUS 2 AG: SARSCOV2ONAVIRUS 2 AG: NEGATIVE

## 2022-08-11 MED ORDER — SERTRALINE HCL 50 MG PO TABS
50.0000 mg | ORAL_TABLET | Freq: Every day | ORAL | Status: DC
  Administered 2022-08-11: 50 mg via ORAL
  Filled 2022-08-11: qty 1

## 2022-08-11 MED ORDER — HYDROXYZINE HCL 25 MG PO TABS: 25.0000 mg | ORAL_TABLET | Freq: Three times a day (TID) | ORAL | Status: DC | PRN

## 2022-08-11 MED ORDER — HYDROXYZINE HCL 25 MG PO TABS: 25.0000 mg | ORAL_TABLET | Freq: Three times a day (TID) | ORAL | 0 refills | Status: DC | PRN

## 2022-08-11 MED ORDER — TRAZODONE HCL 100 MG PO TABS: 100.0000 mg | ORAL_TABLET | Freq: Every day | ORAL | Status: DC

## 2022-08-11 MED ORDER — PANTOPRAZOLE SODIUM 40 MG PO TBEC: 40.0000 mg | DELAYED_RELEASE_TABLET | Freq: Every day | ORAL | Status: DC

## 2022-08-11 MED ORDER — DOXAZOSIN MESYLATE 1 MG PO TABS: 4.0000 mg | ORAL_TABLET | Freq: Every day | ORAL | Status: DC

## 2022-08-11 MED ORDER — INSULIN GLARGINE-YFGN 100 UNIT/ML ~~LOC~~ SOLN: 30.0000 [IU] | Freq: Every day | SUBCUTANEOUS | 11 refills | Status: DC

## 2022-08-11 MED ORDER — MAGNESIUM OXIDE 400 MG PO TABS
400.0000 mg | ORAL_TABLET | Freq: Every day | ORAL | Status: DC
  Filled 2022-08-11: qty 1

## 2022-08-11 MED ORDER — DIVALPROEX SODIUM ER 250 MG PO TB24
250.0000 mg | ORAL_TABLET | Freq: Once | ORAL | Status: AC
  Administered 2022-08-11: 250 mg via ORAL
  Filled 2022-08-11: qty 1

## 2022-08-11 MED ORDER — INSULIN GLARGINE-YFGN 100 UNIT/ML ~~LOC~~ SOLN: 30.0000 [IU] | Freq: Every day | SUBCUTANEOUS | Status: DC

## 2022-08-11 MED ORDER — MAGNESIUM OXIDE -MG SUPPLEMENT 400 (240 MG) MG PO TABS: 400.0000 mg | ORAL_TABLET | Freq: Every day | ORAL | Status: DC

## 2022-08-11 MED ORDER — MAGNESIUM OXIDE -MG SUPPLEMENT 400 (240 MG) MG PO TABS
400.0000 mg | ORAL_TABLET | Freq: Every day | ORAL | Status: DC
  Filled 2022-08-11: qty 1

## 2022-08-11 MED ORDER — GLUCOSE 4 G PO CHEW: 4.0000 g | CHEWABLE_TABLET | ORAL | 12 refills | Status: DC | PRN

## 2022-08-11 MED ORDER — MAGNESIUM HYDROXIDE 400 MG/5ML PO SUSP: 30.0000 mL | Freq: Every day | ORAL | Status: DC | PRN

## 2022-08-11 MED ORDER — ATORVASTATIN CALCIUM 40 MG PO TABS
80.0000 mg | ORAL_TABLET | Freq: Every day | ORAL | Status: DC
  Filled 2022-08-11: qty 2

## 2022-08-11 MED ORDER — GLUCOSE 4 G PO CHEW: 4.0000 g | CHEWABLE_TABLET | ORAL | Status: DC | PRN

## 2022-08-11 MED ORDER — AMLODIPINE BESYLATE 10 MG PO TABS: 10.0000 mg | ORAL_TABLET | Freq: Every day | ORAL | Status: DC

## 2022-08-11 MED ORDER — ALUM & MAG HYDROXIDE-SIMETH 200-200-20 MG/5ML PO SUSP: 30.0000 mL | ORAL | Status: DC | PRN

## 2022-08-11 MED ORDER — ALBUTEROL SULFATE HFA 108 (90 BASE) MCG/ACT IN AERS: 2.0000 | INHALATION_SPRAY | Freq: Four times a day (QID) | RESPIRATORY_TRACT | Status: DC | PRN

## 2022-08-11 MED ORDER — CARVEDILOL 3.125 MG PO TABS
6.2500 mg | ORAL_TABLET | Freq: Two times a day (BID) | ORAL | Status: DC
  Administered 2022-08-11: 6.25 mg via ORAL
  Filled 2022-08-11: qty 2

## 2022-08-11 MED ORDER — ASPIRIN 81 MG PO CHEW: 81.0000 mg | CHEWABLE_TABLET | Freq: Once | ORAL | Status: DC

## 2022-08-11 MED ORDER — SERTRALINE HCL 50 MG PO TABS: 50.0000 mg | ORAL_TABLET | Freq: Every day | ORAL | Status: DC

## 2022-08-11 MED ORDER — ACETAMINOPHEN 325 MG PO TABS: 650.0000 mg | ORAL_TABLET | Freq: Four times a day (QID) | ORAL | Status: DC | PRN

## 2022-08-11 NOTE — ED Provider Triage Note (Cosign Needed Addendum)
Emergency Medicine Provider Triage Evaluation Note  [facility] Jose [name] , a 61 y.o. male  was evaluated in triage.  Pt complains of abnormal,  Was seen for SI.  Noted to have abnormal labs with hyponatremia, however was also found to have hyperglycemia.  Apparently has a bed at [facility] however they need his labs to be normalized prior to excepting him.  Secondary apparently Psych spoke with EDP who agreed to accept patient to the [facility] ED for further evaluation of his labs.  Review of Systems  Positive: SI, abnormal labs Negative:   Physical Exam  Ht 5\' 6"  (1.676 m)   Wt 71.2 kg   BMI 25.34 kg/m  Gen:   Awake, no distress   Resp:  Normal effort  MSK:   Moves extremities without difficulty  Other:    Medical Decision Making  Medically screening exam initiated at 6:38 PM.  Appropriate orders placed.  Jose [name] was informed that the remainder of the evaluation will be completed by another provider, this initial triage assessment does not replace that evaluation, and the importance of remaining in the ED until their evaluation is complete.  Si, abnormal labs  Prior sodium corrects to 130-132 in setting of hyperglycemia>>> which is baseline compared to prior labs in [facility] originally obtained by Psch at [facility], [place], PA-C 08/11/22 1840

## 2022-08-11 NOTE — ED Notes (Signed)
2 belonging bags placed in locker 4

## 2022-08-11 NOTE — Progress Notes (Signed)
Patient discharged at this time and transported to ED by safe transport. Patient provided with all paperwork and belongings. Printed AVS reviewed and patient acknowledges all understanding of situation. Patient currently denies SI,HI, and A/V/H and is able to contract for safety. Patient asymptomatic and with no s/s of current distress.

## 2022-08-11 NOTE — ED Provider Notes (Signed)
Patient initially accepted to [facility] unit, labs were abnormal, low NA, glucose> 350, and creatinine elevated slightly higher than baseline. Spoke with EDP Dr. Conley Canal, who accepted patient to [facility]. [place] will hold bed for 24 hours to allow patient's labs to stabilize.  Molli Barrows, FNP

## 2022-08-11 NOTE — Progress Notes (Addendum)
Report called to charge nurse at [facility] ED. Safe transport also contacted at this time.

## 2022-08-11 NOTE — Progress Notes (Signed)
Pt was accepted to [facility] TODAY 08/11/22 PENDING LABS and COVID-19 results; Bed Assignment 32  Pt meets inpatient criteria per Molli Barrows, FNP  Attending Physician will be Caren Griffins, DO  Report can be called to: [phone]  Please send vol consent via secure email to [email or web address] due to issues currently with fax machine.  Pt can arrive: BED IS READY (please arrive by 2pm)  Care Team notified: Leonia Reader, RN, Molli Barrows, [place], Roanna Banning, LPN, Alethia Berthold, MD, Waylan Boga, NP, Caren Griffins, DO, Melinda Crutch, RN  Nadara Mode, LCSWA 08/11/2022 @ 12:16 PM

## 2022-08-11 NOTE — ED Provider Notes (Deleted)
[facility] Urgent Care Continuous Assessment Admission H&P  Date: 08/11/22 Patient Name: Jose [name] MRN: [id number] Chief Complaint:  Chief Complaint  Patient presents with   Post-Traumatic Stress Disorder   Suicidal      Diagnoses:  Final diagnoses:  Suicidal ideations  Alcohol use disorder  Severe episode of recurrent major depressive disorder, with psychotic features ([place])  Alcohol-induced mood disorder ([place])    Subjective   Jose [name] 61 y.o., male patient presented to [facility] as a walk in accompanied by GPD with complaints of feeling suicidal with a plan to overdose.  Jose [name], 61 y.o., male patient seen face to face by this provider, consulted with Dr. Dwyane Dee; and chart reviewed on 08/11/22.   HPI: Jose [name], 7 yr.old male, presents to [facility] today voluntarily via GPD, due to worsening suicidal ideations and nightmares in which a voice is telling him to kill himself. Per patient, "I woke up around 2 am after a bad nightmare and hearing a voice telling me to kill myself. I prayed to God and then called [phone] for help".  He reports his plan for suicide was to overdose by taking all of his prescribed medications.  Jose [name] has an active mental health history of PTSD, MDD, Alcohol Dependence with Alcohol induced mood disorder.  Patient reports several weeks of poor sleep and worsening depression. Last night he awakened after having a nightmare in which a voice was telling him to kill himself.  He reports being severely depressed for several weeks. Patient is established with the [facility] in [place] and reports that he has not received adequate mental health assistance.  However he was seen by a [facility] psychiatrist (uncertain of name) on [id number], started on sertraline and Depakote.  Per patient " The man I saw was foreign and he only spent 10 minutes with me on a screen". He further explains a large amount of his anger is at the [place] for their lack of help with his mental  health condition.  Jose [name] endorses that his primary care provider at the [facility] placed a mental health clinic referral for him, however he was told that it may take months. He endorses that he needs help now as he is afraid something bad is going to happen. " I can't take much more of these nightmares and feeling like this".   He is originally from the [place] area and endorses that he has family out of town but no social support locally. He states, "I prefer being alone due to my anger issues".  Patient endorses daily use of alcohol reports that he drinks on average 2-3 beers per day.  Last intake of alcohol was last night in which she drinks 12 ounces beer.  Denies liquor or wine use.  He denies any illicit drug use including marijuana.  He endorses that he has smoked marijuana in the past but he no longer engages in any illicit drugs.  He denies any prior inpatient mental health related admissions. When patient was asked does he have any homicidal ideation he endorses that he responded "yes". When asked who specifically, who he desires to kill, he did disclose a person and further clarified that the homicidal ideations are on and off , unable to disclose any specific person that he would like to do harm.  He denies any prior suicidal attempts but endorses a long history of depression and difficulty managing his anger.  He endorses that he has been in therapy previously however  only for short period of time. Reports the nightmares difficulties has been an intermittent problem for years, however nightmares have recently worsened. He states, " My nightmares involve me being in [place] in a swamp playing war games". Jaquill stops and is unable to provide more detail with the exception he reports that he awakens angry and afraid after experiencing these nightmares. He endorses that his overall poor physical health conditions also contribute to his depression. Patient has heart disease, diabetes, and COPD. He is unable to  tell me the medications his new psychiatrist prescribed. Per EMR, on 08/07/22, patient was newly prescribed Depakote and Sertraline. He has been taking Trazodone long term for insomnia. He occasionally takes melatonin to induce sleep. He reports previously being prescribed Seroquel, "it did not work either".   Patient is calm and cooperative. Patient has clear and coherent speech. Patient  is fully alert and oriented. Patient endorses SI with plan of overdosing and HI (without a plan and without identifying any specific target. Endorses auditory hallucinations only at nighttime without any commands. Patient denies visual hallucinations. Patient does not appear to be responding to any external stimuli. Patient is unable to contract for safety and poses a risk for safety. PHQ 2-9:  [place] ED from 08/11/2022 in [facility]  Thoughts that you would be better off dead, or of hurting yourself in some way Several days  PHQ-9 Total Score 11       [place] ED from 08/11/2022 in [facility] ED from 04/22/2022 in [place] ED to Hosp-Admission (Discharged) from 01/27/2022 in [facility] 4E CV SURGICAL PROGRESSIVE CARE  C-SSRS RISK CATEGORY Error: Q3, 4, or 5 should not be populated when Q2 is No No Risk No Risk        Total Time spent with patient: 45 minutes  Musculoskeletal  Strength & Muscle Tone: within normal limits Gait & Station: normal Patient leans: N/A  Psychiatric Specialty Exam  Presentation General Appearance: Appropriate for Environment Eye Contact:Fair Speech:Clear and Coherent Speech Volume:Normal Handedness:No data recorded  Mood and Affect  Mood:Depressed; Anxious Affect:Depressed  Thought Process  Thought Processes:Linear Descriptions of Associations:Intact  Orientation:Full (Time, Place and Person)  Thought Content:No data recorded Diagnosis of Schizophrenia or  Schizoaffective disorder in past: No   Hallucinations:Hallucinations: Auditory Description of Auditory Hallucinations: Hears voices at night that do not say anything or tell him anything specifically  Ideas of Reference:None  Suicidal Thoughts:Suicidal Thoughts: Yes, Passive SI Passive Intent and/or Plan: With Plan; With Means to Carry Out  Homicidal Thoughts:Homicidal Thoughts: Yes, Passive (Will provide the name of anyone he desires to kill) HI Passive Intent and/or Plan: Without Intent; Without Means to Carry Out   Sensorium  Memory:Immediate Good; Recent Good; Remote Good Judgment:Impaired Insight:Fair  Executive Functions  Concentration:Fair Attention Span:Fair Recall:Good Fund of Knowledge:Good Language:Good  Psychomotor Activity  Psychomotor Activity:Psychomotor Activity: Normal  Assets  Assets:Communication Skills; Desire for Improvement; Housing; Financial Resources/Insurance  Sleep  Sleep:Sleep: Poor Number of Hours of Sleep: 5  Nutritional Assessment (For OBS and FBC admissions only) Has the patient had a weight loss or gain of 10 pounds or more in the last 3 months?: No Has the patient had a decrease in food intake/or appetite?: Yes (endorses poor appetite related to depressed mood) Does the patient have dental problems?: No Does the patient have eating habits or behaviors that may be indicators of an eating disorder including binging or inducing vomiting?: No Has  the patient recently lost weight without trying?: 0 Has the patient been eating poorly because of a decreased appetite?: 1 (Reports poor appetite over the last week) Malnutrition Screening Tool Score: 1    Physical Exam Constitutional:      Appearance: Normal appearance. He is not toxic-appearing or diaphoretic.  Eyes:     Extraocular Movements: Extraocular movements intact.     Conjunctiva/sclera: Conjunctivae normal.     Pupils: Pupils are equal, round, and reactive to light.  Pulmonary:      Effort: Pulmonary effort is normal.  Musculoskeletal:     Cervical back: Normal range of motion.  Skin:    Capillary Refill: Capillary refill takes less than 2 seconds.  Neurological:     General: No focal deficit present.     Mental Status: He is alert and oriented to person, place, and time.  Psychiatric:        Mood and Affect: Mood is anxious and depressed.        Behavior: Behavior is cooperative.        Thought Content: Thought content includes homicidal and suicidal ideation. Thought content includes suicidal plan.        Judgment: Judgment is not impulsive or inappropriate.    Review of Systems  Constitutional:  Positive for malaise/fatigue.  HENT: Negative.    Eyes: Negative.   Respiratory: Negative.    Cardiovascular: Negative.   Gastrointestinal: Negative.   Genitourinary: Negative.   Musculoskeletal: Negative.   Skin: Negative.   Psychiatric/Behavioral:  Positive for depression, hallucinations, substance abuse and suicidal ideas. The patient is nervous/anxious and has insomnia.     Blood pressure 116/68, pulse 76, temperature (!) 97.5 F (36.4 C), temperature source Oral, resp. rate 16, height 5\' 6"  (1.676 m), weight 157 lb (71.2 kg), SpO2 99 %. Body mass index is 25.34 kg/m.  Past Psychiatric History:  Patient denies any mental health or substance use related hospital admission   Is the patient at risk to self? Yes  Has the patient been a risk to self in the past 6 months? Yes .    Has the patient been a risk to self within the distant past? No   Is the patient a risk to others?  Yes, verbalizes homicidal ideation without any specific target    Has the patient been a risk to others in the past 6 months? No   Has the patient been a risk to others within the distant past? No   Past Medical History:  Past Medical History:  Diagnosis Date   Alcohol abuse    CAD (coronary artery disease)    a. Reported MI in 2012 s/p 2 stents;  b. 08/2012 Cath: LM 20, LAD  20 diff ISR, jailed septal - 99%, LCX 30ost, RI large, min irregs, RCA 30p, 20-30 ISR-->Med Rx; c. 2017 [facility]   CKD (chronic kidney disease), stage III (HCC)    Cocaine abuse (HCC)    COPD (chronic obstructive pulmonary disease) ([place])    Depression    Diabetes mellitus without complication ([place])    Hyperlipidemia    Hypertension    Hypertensive urgency 02/11/2021   Noncompliance     Past Surgical History:  Procedure Laterality Date   CARDIAC CATHETERIZATION     CORONARY STENT PLACEMENT     LEFT HEART CATH AND CORONARY ANGIOGRAPHY N/A 08/22/2020   Procedure: LEFT HEART CATH AND CORONARY ANGIOGRAPHY;  Surgeon: Belva Crome, MD;  Location: [place] CV  LAB;  Service: Cardiovascular;  Laterality: N/A;   LEFT HEART CATH AND CORONARY ANGIOGRAPHY N/A 01/28/2022   Procedure: LEFT HEART CATH AND CORONARY ANGIOGRAPHY;  Surgeon: Troy Sine, MD;  Location: [place] CV LAB;  Service: Cardiovascular;  Laterality: N/A;   LEFT HEART CATHETERIZATION WITH CORONARY ANGIOGRAM Bilateral 08/25/2012   Procedure: LEFT HEART CATHETERIZATION WITH CORONARY ANGIOGRAM;  Surgeon: Burnell Blanks, MD;  Location: [facility] CATH LAB;  Service: Cardiovascular;  Laterality: Bilateral;    Family History:  Family History  Problem Relation Age of Onset   Heart disease Mother        MI 50s    Social History:  Social History   Socioeconomic History   Marital status: Married    Spouse name: Not on file   Number of children: Not on file   Years of education: Not on file   Highest education level: Not on file  Occupational History   Not on file  Tobacco Use   Smoking status: Former    Types: Cigarettes    Quit date: 11/25/2007    Years since quitting: 14.7   Smokeless tobacco: Never  Vaping Use   Vaping Use: Never used  Substance and Sexual Activity   Alcohol use: Yes    Comment: Used to drink heavily - says currently 2 beers/day.   Drug use: Yes    Types: Marijuana     Comment: reports not using cocaine   Sexual activity: Yes    Birth control/protection: None  Other Topics Concern   Not on file  Social History Narrative   Lives in [place] by himself.  Does not work or routinely exercise.   Social Determinants of Health   Financial Resource Strain: Not on file  Food Insecurity: No Food Insecurity (07/25/2022)   Hunger Vital Sign    Worried About Running Out of Food in the Last Year: Never true    Ran Out of Food in the Last Year: Never true  Transportation Needs: No Transportation Needs (07/25/2022)   PRAPARE - Hydrologist (Medical): No    Lack of Transportation (Non-Medical): No  Physical Activity: Not on file  Stress: Not on file  Social Connections: Not on file  Intimate Partner Violence: Not on file    SDOH:  [place]: No Food Insecurity (07/25/2022)  Transportation Needs: No Transportation Needs (07/25/2022)  Depression (PHQ2-9): High Risk (08/11/2022)  Tobacco Use: Medium Risk (01/29/2022)    Last Labs:  Admission on 08/11/2022  Component Date Value Ref Range Status   POC Amphetamine UR 08/11/2022 None Detected  NONE DETECTED (Cut Off Level 1000 ng/mL) Final   POC Secobarbital (BAR) 08/11/2022 None Detected  NONE DETECTED (Cut Off Level 300 ng/mL) Final   POC Buprenorphine (BUP) 08/11/2022 None Detected  NONE DETECTED (Cut Off Level 10 ng/mL) Final   POC Oxazepam (BZO) 08/11/2022 None Detected  NONE DETECTED (Cut Off Level 300 ng/mL) Final   POC Cocaine UR 08/11/2022 None Detected  NONE DETECTED (Cut Off Level 300 ng/mL) Final   POC Methamphetamine UR 08/11/2022 None Detected  NONE DETECTED (Cut Off Level 1000 ng/mL) Final   POC Morphine 08/11/2022 None Detected  NONE DETECTED (Cut Off Level 300 ng/mL) Final   POC Methadone UR 08/11/2022 None Detected  NONE DETECTED (Cut Off Level 300 ng/mL) Final   POC Oxycodone UR 08/11/2022 None Detected  NONE DETECTED (Cut Off Level 100 ng/mL) Final   POC  Marijuana UR 08/11/2022  Positive (A)  NONE DETECTED (Cut Off Level 50 ng/mL) Final   SARSCOV2ONAVIRUS 2 AG 08/11/2022 NEGATIVE  NEGATIVE Final   Comment: (NOTE) SARS-CoV-2 antigen NOT DETECTED.   Negative results are presumptive.  Negative results do not preclude SARS-CoV-2 infection and should not be used as the sole basis for treatment or other patient management decisions, including infection  control decisions, particularly in the presence of clinical signs and  symptoms consistent with COVID-19, or in those who have been in contact with the virus.  Negative results must be combined with clinical observations, patient history, and epidemiological information. The expected result is Negative.  Fact Sheet for Patients: HandmadeRecipes.com.cy  Fact Sheet for Healthcare Providers: FuneralLife.at  This test is not yet approved or cleared by the [place] FDA and  has been authorized for detection and/or diagnosis of SARS-CoV-2 by FDA under an Emergency Use Authorization (EUA).  This EUA will remain in effect (meaning this test can be used) for the duration of  the COV                          ID-19 declaration under Section 564(b)(1) of the Act, 21 U.S.C. section 360bbb-3(b)(1), unless the authorization is terminated or revoked sooner.    Admission on 04/22/2022, Discharged on 04/22/2022  Component Date Value Ref Range Status   SARS Coronavirus 2 by RT PCR 04/22/2022 NEGATIVE  NEGATIVE Final   Comment: (NOTE) SARS-CoV-2 target nucleic acids are NOT DETECTED.  The SARS-CoV-2 RNA is generally detectable in upper respiratory specimens during the acute phase of infection. The lowest concentration of SARS-CoV-2 viral copies this assay can detect is 138 copies/mL. A negative result does not preclude SARS-Cov-2 infection and should not be used as the sole basis for treatment or other patient management decisions. A negative result may  occur with  improper specimen collection/handling, submission of specimen other than nasopharyngeal swab, presence of viral mutation(s) within the areas targeted by this assay, and inadequate number of viral copies(<138 copies/mL). A negative result must be combined with clinical observations, patient history, and epidemiological information. The expected result is Negative.  Fact Sheet for Patients:  EntrepreneurPulse.com.au  Fact Sheet for Healthcare Providers:  IncredibleEmployment.be  This test is no                          t yet approved or cleared by the [place] FDA and  has been authorized for detection and/or diagnosis of SARS-CoV-2 by FDA under an Emergency Use Authorization (EUA). This EUA will remain  in effect (meaning this test can be used) for the duration of the COVID-19 declaration under Section 564(b)(1) of the Act, 21 U.S.C.section 360bbb-3(b)(1), unless the authorization is terminated  or revoked sooner.       Influenza A by PCR 04/22/2022 NEGATIVE  NEGATIVE Final   Influenza B by PCR 04/22/2022 NEGATIVE  NEGATIVE Final   Comment: (NOTE) The Xpert Xpress SARS-CoV-2/FLU/RSV plus assay is intended as an aid in the diagnosis of influenza from Nasopharyngeal swab specimens and should not be used as a sole basis for treatment. Nasal washings and aspirates are unacceptable for Xpert Xpress SARS-CoV-2/FLU/RSV testing.  Fact Sheet for Patients: EntrepreneurPulse.com.au  Fact Sheet for Healthcare Providers: IncredibleEmployment.be  This test is not yet approved or cleared by the [place] FDA and has been authorized for detection and/or diagnosis of SARS-CoV-2 by FDA under an Emergency Use Authorization (EUA). This EUA will  remain in effect (meaning this test can be used) for the duration of the COVID-19 declaration under Section 564(b)(1) of the Act, 21 U.S.C. section  360bbb-3(b)(1), unless the authorization is terminated or revoked.  Performed at [facility], [place] [address], [place], [place] [address]    WBC 04/22/2022 7.7  4.0 - 10.5 K/uL Final   RBC 04/22/2022 4.81  4.22 - 5.81 MIL/uL Final   Hemoglobin 04/22/2022 13.6  13.0 - 17.0 g/dL Final   HCT 04/22/2022 36.5 (L)  39.0 - 52.0 % Final   MCV 04/22/2022 75.9 (L)  80.0 - 100.0 fL Final   MCH 04/22/2022 28.3  26.0 - 34.0 pg Final   MCHC 04/22/2022 37.3 (H)  30.0 - 36.0 g/dL Final   RDW 04/22/2022 13.3  11.5 - 15.5 % Final   Platelets 04/22/2022 266  150 - 400 K/uL Final   nRBC 04/22/2022 0.0  0.0 - 0.2 % Final   Neutrophils Relative % 04/22/2022 48  % Final   Neutro Abs 04/22/2022 3.7  1.7 - 7.7 K/uL Final   Lymphocytes Relative 04/22/2022 38  % Final   Lymphs Abs 04/22/2022 2.9  0.7 - 4.0 K/uL Final   Monocytes Relative 04/22/2022 8  % Final   Monocytes Absolute 04/22/2022 0.6  0.1 - 1.0 K/uL Final   Eosinophils Relative 04/22/2022 6  % Final   Eosinophils Absolute 04/22/2022 0.4  0.0 - 0.5 K/uL Final   Basophils Relative 04/22/2022 0  % Final   Basophils Absolute 04/22/2022 0.0  0.0 - 0.1 K/uL Final   Immature Granulocytes 04/22/2022 0  % Final   Abs Immature Granulocytes 04/22/2022 0.02  0.00 - 0.07 K/uL Final   Performed at [facility], [place] [address] [place], [place] [address]   B Natriuretic Peptide 04/22/2022 48.9  0.0 - 100.0 pg/mL Final   Performed at [facility] [address] [place], [place] [address]   Magnesium 04/22/2022 1.2 (L)  1.7 - 2.4 mg/dL Final   Performed at [facility] [address], [place], [place] [address]   Troponin I (High Sensitivity) 04/22/2022 33 (H)  <18 ng/L Final   Comment: (NOTE) Elevated high sensitivity troponin I (hsTnI) values and significant  changes across serial measurements may suggest ACS but many other  chronic and acute conditions are known to elevate hsTnI results.  Refer to the "Links" section for chest pain  algorithms and additional  guidance. Performed at [facility], [place] [address], [place], [place] [address]    Total Protein 04/22/2022 6.5  6.5 - 8.1 g/dL Final   Albumin 04/22/2022 3.8  3.5 - 5.0 g/dL Final   AST 04/22/2022 23  15 - 41 U/L Final   ALT 04/22/2022 24  0 - 44 U/L Final   Alkaline Phosphatase 04/22/2022 66  38 - 126 U/L Final   Total Bilirubin 04/22/2022 0.7  0.3 - 1.2 mg/dL Final   Bilirubin, Direct 04/22/2022 0.1  0.0 - 0.2 mg/dL Final   Indirect Bilirubin 04/22/2022 0.6  0.3 - 0.9 mg/dL Final   Performed at [facility] [address], [place], [place] [address]   Sodium 04/22/2022 131 (L)  135 - 145 mmol/L Final   Potassium 04/22/2022 3.5  3.5 - 5.1 mmol/L Final   Chloride 04/22/2022 99  98 - 111 mmol/L Final   CO2 04/22/2022 22  22 - 32 mmol/L Final   Glucose, Bld 04/22/2022 297 (H)  70 -  99 mg/dL Final   Glucose reference range applies only to samples taken after fasting for at least 8 hours.   BUN 04/22/2022 25 (H)  8 - 23 mg/dL Final   Creatinine, Ser 04/22/2022 1.87 (H)  0.61 - 1.24 mg/dL Final   Calcium 04/22/2022 10.0  8.9 - 10.3 mg/dL Final   GFR, Estimated 04/22/2022 40 (L)  >60 mL/min Final   Comment: (NOTE) Calculated using the CKD-EPI Creatinine Equation (2021)    Anion gap 04/22/2022 10  5 - 15 Final   Performed at [facility], [place] [address] [place], [place] [address]   Color, Urine 04/22/2022 YELLOW  YELLOW Final   APPearance 04/22/2022 CLEAR  CLEAR Final   Specific Gravity, Urine 04/22/2022 1.017  1.005 - 1.030 Final   pH 04/22/2022 5.0  5.0 - 8.0 Final   Glucose, UA 04/22/2022 150 (A)  NEGATIVE mg/dL Final   Hgb urine dipstick 04/22/2022 NEGATIVE  NEGATIVE Final   Bilirubin Urine 04/22/2022 NEGATIVE  NEGATIVE Final   Ketones, ur 04/22/2022 NEGATIVE  NEGATIVE mg/dL Final   Protein, ur 04/22/2022 30 (A)  NEGATIVE mg/dL Final   Nitrite 04/22/2022 NEGATIVE  NEGATIVE Final   Leukocytes,Ua 04/22/2022 NEGATIVE  NEGATIVE Final    RBC / HPF 04/22/2022 0-5  0 - 5 RBC/hpf Final   WBC, UA 04/22/2022 0-5  0 - 5 WBC/hpf Final   Bacteria, UA 04/22/2022 NONE SEEN  NONE SEEN Final   Performed at [facility], [place] [address], [place], [place] [address]   Opiates 04/22/2022 NONE DETECTED  NONE DETECTED Final   Cocaine 04/22/2022 NONE DETECTED  NONE DETECTED Final   Benzodiazepines 04/22/2022 NONE DETECTED  NONE DETECTED Final   Amphetamines 04/22/2022 NONE DETECTED  NONE DETECTED Final   Tetrahydrocannabinol 04/22/2022 POSITIVE (A)  NONE DETECTED Final   Barbiturates 04/22/2022 NONE DETECTED  NONE DETECTED Final   Comment: (NOTE) DRUG SCREEN FOR MEDICAL PURPOSES ONLY.  IF CONFIRMATION IS NEEDED FOR ANY PURPOSE, NOTIFY LAB WITHIN 5 DAYS.  LOWEST DETECTABLE LIMITS FOR URINE DRUG SCREEN Drug Class                     Cutoff (ng/mL) Amphetamine and metabolites    1000 Barbiturate and metabolites    200 Benzodiazepine                 [id number] Tricyclics and metabolites     300 Opiates and metabolites        300 Cocaine and metabolites        300 THC                            50 Performed at [facility], [place] [address] [place], [place] [address]    Troponin I (High Sensitivity) 04/22/2022 26 (H)  <18 ng/L Final   Comment: (NOTE) Elevated high sensitivity troponin I (hsTnI) values and significant  changes across serial measurements may suggest ACS but many other  chronic and acute conditions are known to elevate hsTnI results.  Refer to the "Links" section for chest pain algorithms and additional  guidance. Performed at [facility], [place] [address], [place], [place] [address]     Allergies: Lisinopril-hydrochlorothiazide and Simvastatin  PTA Medications: (Not in a hospital admission)   Medical Decision Making  Caseton Halaby, 61 year old male presented voluntarily, today endorsing active suicidal ideations and a plan to overdose by taking  all of his prescribed medications. During interview, patient  further endorsed homicidal ideations without a plan or did not indicate who he desired to kill. Patient has a comorbid substance and alcohol use disorder. Patient is unable to contract for safety and meets criteria for inpatient admission due to risk for safety to himself and potentially to others. Patient is being admitted to continuous assessment unit, while awaiting inpatient admission acceptance.  Patient verbalized agreement with plan.   Clinical Course as of 08/11/22 1340  Mon Aug 11, 2022  1252 ECG Normal Sinus Rhythm, no ST changes. Rate 83, personally reviewed by me. [KH]    Clinical Course User Index [KH] Scot Jun, FNP    Recommendations  Based on my evaluation the patient does not appear to have an emergency medical condition, although patient meets criteria for inpatient admission. Patient has been accepted to [facility] for psychiatric stabilization and medical management.   Orders Placed During encounter: CBC, CMP, TSH, Ethanol, Magnesium, Lipid, Hemoglobin A1C, COVID/FLU  pending.  Rapid Covid is negative. EKG -NSR  without ST changes.  UDS positive for marijuana.    Molli Barrows, FNP 08/11/22  1:40 PM

## 2022-08-11 NOTE — ED Provider Notes (Cosign Needed Addendum)
[facility]al [facility]gent [facility] Medical Screening Exam  Patient Name: Jose [name] MRN: [id number] Date of Evaluation: 08/11/22 Chief Complaint:  Suicidal Ideations Diagnosis:  Final diagnoses:  Suicidal ideations  Alcohol use disorder  Severe episode of recurrent major depressive disorder, with psychotic features ([place])  Alcohol-induced mood disorder ([place])    History of Present illness: Jose [name] 61 y.o., male patient presented to [facility] as a walk in accompanied by [employer] with complaints of feeling suicidal with a plan to overdose.   Jose [name], 61 y.o., male patient seen face to face by this provider, consulted with Dr. Dwyane Dee; and chart reviewed on 08/11/22.   Jose [name], 61 yr.old male, presents to [facility] today voluntarily via [employer], due to worsening suicidal ideations and nightmares in which a voice is telling him to kill himself. Per patient, "I woke up around 2 am after a bad nightmare and hearing a voice telling me to kill myself. I prayed to God and then called [id number] for help".  He reports his plan for suicide was to overdose by taking all of his prescribed medications.   Jose [name] has an active mental health history of PTSD, MDD, Alcohol Dependence with Alcohol induced mood disorder.  Patient reports several weeks of poor sleep and worsening depression. Last night he awakened after having a nightmare in which a voice was telling him to kill himself.  He reports being severely depressed for several weeks. Patient is established with the [facility] in [place] and reports that he has not received adequate mental health assistance.  However he was seen by a [facility] psychiatrist (uncertain of name) on [id number], started on sertraline and Depakote.  Per patient " The man I saw was foreign and he only spent 10 minutes with me on a screen". He further explains a large amount of his anger is at the [place] for their lack of help with his mental health condition.  Jose [name] endorses that his primary  care provider at the [facility] placed a mental health clinic referral for him, however he was told that it may take months. He endorses that he needs help now as he is afraid something bad is going to happen. " I can't take much more of these nightmares and feeling like this".   He is originally from the [place] area and endorses that he has family out of town but no social support locally. He states, "I prefer being alone due to my anger issues".  Patient endorses daily use of alcohol reports that he drinks on average 2-3 beers per day.  Last intake of alcohol was last night in which she drinks 12 ounces beer.  Denies liquor or wine use.  He denies any illicit drug use including marijuana.  He endorses that he has smoked marijuana in the past but he no longer engages in any illicit drugs.  He denies any prior inpatient mental health related admissions. When patient was asked does he have any homicidal ideation he endorses that he responded "yes". When asked who specifically, who he desires to kill, he did disclose a person and further clarified that the homicidal ideations are on and off , unable to disclose any specific person that he would like to do harm.   He denies any prior suicidal attempts but endorses a long history of depression and difficulty managing his anger.  He endorses that he has been in therapy previously however only for short period of time. Reports the nightmares difficulties has been  an intermittent problem for years, however nightmares have recently worsened. He states, " My nightmares involve me being in [place] in a swamp playing war games". Jose [name] stops and is unable to provide more detail with the exception he reports that he awakens angry and afraid after experiencing these nightmares. He endorses that his overall poor physical health conditions also contribute to his depression. Patient has heart disease, diabetes, and COPD. He is unable to tell me the medications his new psychiatrist  prescribed. Per EMR, on 08/07/22, patient was newly prescribed Depakote and Sertraline. He has been taking Trazodone long term for insomnia. He occasionally takes melatonin to induce sleep. He reports previously being prescribed Seroquel, "it did not work either".    Patient is calm and cooperative. Patient has clear and coherent speech. Patient  is fully alert and oriented. Patient endorses SI with plan of overdosing and HI (without a plan and without identifying any specific target. Endorses auditory hallucinations only at nighttime without any commands. Patient denies visual hallucinations. Patient does not appear to be responding to any external stimuli. Patient is unable to contract for safety and poses a risk for safety.  Psychiatric Specialty Exam  Presentation  General Appearance:Appropriate for Environment  Eye Contact:Fair  Speech:Clear and Coherent  Speech Volume:Normal  Handedness:No data recorded  Mood and Affect  Mood:Depressed; Anxious  Affect:Depressed   Thought Process  Thought Processes:Linear  Descriptions of Associations:Intact  Orientation:Full (Time, Place and Person)  Thought Content:No data recorded Diagnosis of Schizophrenia or Schizoaffective disorder in past: No   Hallucinations:Auditory Hears voices at night that do not say anything or tell him anything specifically  Ideas of Reference:None  Suicidal Thoughts:Yes, Passive With Plan; With Means to [place]  Homicidal Thoughts:Yes, Passive (Will provide the name of anyone he desires to kill) Without Intent; Without Means to Carry Out   Sensorium  Memory:Immediate Good; Recent Good; Remote Good  Judgment:Impaired  Insight:Fair   Executive Functions  Concentration:Fair  Attention Span:Fair  [place]  Language:Good   Psychomotor Activity  Psychomotor Activity:Normal   Assets  Assets:Communication Skills; Desire for Improvement; Housing; Financial  Resources/Insurance   Sleep  Sleep:Poor  Number of hours: 5   Nutritional Assessment (For OBS and FBC admissions only) Has the patient had a weight loss or gain of 10 pounds or more in the last 3 months?: No Has the patient had a decrease in food intake/or appetite?: Yes (endorses poor appetite related to depressed mood) Does the patient have dental problems?: No Does the patient have eating habits or behaviors that may be indicators of an eating disorder including binging or inducing vomiting?: No Has the patient recently lost weight without trying?: 0 Has the patient been eating poorly because of a decreased appetite?: 1 (Reports poor appetite over the last week) Malnutrition Screening Tool Score: 1   Physical Exam Constitutional:      Appearance: Normal appearance. He is not toxic-appearing or diaphoretic.  Eyes:     Extraocular Movements: Extraocular movements intact.     Conjunctiva/sclera: Conjunctivae normal.     Pupils: Pupils are equal, round, and reactive to light.  Pulmonary:     Effort: Pulmonary effort is normal.  Musculoskeletal:     Cervical back: Normal range of motion.  Skin:    Capillary Refill: Capillary refill takes less than 2 seconds.  Neurological:     General: No focal deficit present.     Mental Status: He is alert and oriented to person, place, and  time.  Psychiatric:        Mood and Affect: Mood is anxious and depressed.        Behavior: Behavior is cooperative.        Thought Content: Thought content includes homicidal and suicidal ideation. Thought content includes suicidal plan.        Judgment: Judgment is not impulsive or inappropriate.      Review of Systems  Constitutional:  Positive for malaise/fatigue.  HENT: Negative.    Eyes: Negative.   Respiratory: Negative.    Cardiovascular: Negative.   Gastrointestinal: Negative.   Genitourinary: Negative.   Musculoskeletal: Negative.   Skin: Negative.   Psychiatric/Behavioral:  Positive  for depression, hallucinations, substance abuse and suicidal ideas. The patient is nervous/anxious and has insomnia.      Blood pressure (!) 154/90, pulse 85, temperature 97.8 F (36.6 C), resp. rate 18, height 5\' 6"  (1.676 m), weight 157 lb (71.2 kg), SpO2 97 %. Body mass index is 25.34 kg/m.  Results for orders placed or performed during the hospital encounter of 08/11/22 (from the past 24 hour(s))  POCT Urine Drug Screen - (I-Screen)     Status: Abnormal   Collection Time: 08/11/22 11:35 AM  Result Value Ref Range   POC Amphetamine UR None Detected NONE DETECTED (Cut Off Level 1000 ng/mL)   POC Secobarbital (BAR) None Detected NONE DETECTED (Cut Off Level 300 ng/mL)   POC Buprenorphine (BUP) None Detected NONE DETECTED (Cut Off Level 10 ng/mL)   POC Oxazepam (BZO) None Detected NONE DETECTED (Cut Off Level 300 ng/mL)   POC Cocaine UR None Detected NONE DETECTED (Cut Off Level 300 ng/mL)   POC Methamphetamine UR None Detected NONE DETECTED (Cut Off Level 1000 ng/mL)   POC Morphine None Detected NONE DETECTED (Cut Off Level 300 ng/mL)   POC Methadone UR None Detected NONE DETECTED (Cut Off Level 300 ng/mL)   POC Oxycodone UR None Detected NONE DETECTED (Cut Off Level 100 ng/mL)   POC Marijuana UR Positive (A) NONE DETECTED (Cut Off Level 50 ng/mL)  Resp Panel by RT-PCR (Flu A&B, Covid) Anterior Nasal Swab     Status: None   Collection Time: 08/11/22 11:36 AM   Specimen: Anterior Nasal Swab  Result Value Ref Range   SARS Coronavirus 2 by RT PCR NEGATIVE NEGATIVE   Influenza A by PCR NEGATIVE NEGATIVE   Influenza B by PCR NEGATIVE NEGATIVE  POC SARS Coronavirus 2 Ag     Status: None   Collection Time: 08/11/22 11:38 AM  Result Value Ref Range   SARSCOV2ONAVIRUS 2 AG NEGATIVE NEGATIVE  CBC with Differential/Platelet     Status: Abnormal   Collection Time: 08/11/22 12:13 PM  Result Value Ref Range   WBC 5.9 4.0 - 10.5 K/uL   RBC 6.86 (H) 4.22 - 5.81 MIL/uL   Hemoglobin 19.2 (H) 13.0  - 17.0 g/dL   HCT 52.0 39.0 - 52.0 %   MCV 75.8 (L) 80.0 - 100.0 fL   MCH 28.0 26.0 - 34.0 pg   MCHC 36.9 (H) 30.0 - 36.0 g/dL   RDW 13.8 11.5 - 15.5 %   Platelets 109 (L) 150 - 400 K/uL   nRBC 0.0 0.0 - 0.2 %   Neutrophils Relative % 59 %   Neutro Abs 3.5 1.7 - 7.7 K/uL   Lymphocytes Relative 30 %   Lymphs Abs 1.8 0.7 - 4.0 K/uL   Monocytes Relative 6 %   Monocytes Absolute 0.3 0.1 - 1.0 K/uL  Eosinophils Relative 4 %   Eosinophils Absolute 0.2 0.0 - 0.5 K/uL   Basophils Relative 1 %   Basophils Absolute 0.0 0.0 - 0.1 K/uL   Immature Granulocytes 0 %   Abs Immature Granulocytes 0.02 0.00 - 0.07 K/uL  Comprehensive metabolic panel     Status: Abnormal   Collection Time: 08/11/22 12:13 PM  Result Value Ref Range   Sodium 126 (L) 135 - 145 mmol/L   Potassium 4.1 3.5 - 5.1 mmol/L   Chloride 91 (L) 98 - 111 mmol/L   CO2 21 (L) 22 - 32 mmol/L   Glucose, Bld 366 (H) 70 - 99 mg/dL   BUN 22 8 - 23 mg/dL   Creatinine, Ser 1.84 (H) 0.61 - 1.24 mg/dL   Calcium 10.2 8.9 - 10.3 mg/dL   Total Protein 7.3 6.5 - 8.1 g/dL   Albumin 4.5 3.5 - 5.0 g/dL   AST 26 15 - 41 U/L   ALT 26 0 - 44 U/L   Alkaline Phosphatase 65 38 - 126 U/L   Total Bilirubin 0.7 0.3 - 1.2 mg/dL   GFR, Estimated 41 (L) >60 mL/min   Anion gap 14 5 - 15  Lipid panel     Status: Abnormal   Collection Time: 08/11/22 12:13 PM  Result Value Ref Range   Cholesterol 145 0 - 200 mg/dL   Triglycerides 174 (H) <150 mg/dL   HDL 61 >40 mg/dL   Total CHOL/HDL Ratio 2.4 RATIO   VLDL 35 0 - 40 mg/dL   LDL Cholesterol 49 0 - 99 mg/dL  Hemoglobin A1c     Status: Abnormal   Collection Time: 08/11/22 12:13 PM  Result Value Ref Range   Hgb A1c MFr Bld 7.6 (H) 4.8 - 5.6 %   Mean Plasma Glucose 171.42 mg/dL  Magnesium     Status: Abnormal   Collection Time: 08/11/22 12:13 PM  Result Value Ref Range   Magnesium 1.3 (L) 1.7 - 2.4 mg/dL  TSH     Status: None   Collection Time: 08/11/22 12:13 PM  Result Value Ref Range   TSH  2.797 0.350 - 4.500 uIU/mL  Ethanol     Status: None   Collection Time: 08/11/22 12:13 PM  Result Value Ref Range   Alcohol, Ethyl (B) <10 <10 mg/dL          Musculoskeletal: Strength & Muscle Tone: within normal limits Gait & Station: normal Patient leans: N/A   [facility] MSE Discharge Disposition for Follow up and Recommendations: Based on my evaluation I certify that psychiatric inpatient services furnished can reasonably be expected to improve the patient's condition which I recommend transfer to an appropriate accepting facility.      Molli Barrows, FNP 08/11/2022, 5:22 PM

## 2022-08-11 NOTE — ED Notes (Signed)
Patient A&Ox4. VS stable. Patient cooperative and calm on unit. Patient med compliant and denies any pain/discomfort at this moment. Patient states feeling depressed but denies SI,HI, and A/V/H at this moment. Patient stated having a plan to overdose on medication during triage but denies any intent now. Patient verbally contracts for safety on unit. Patient provided with meal and remains safe on unit at this time.

## 2022-08-11 NOTE — BH Assessment (Signed)
Comprehensive Clinical Assessment (CCA) Note  08/11/2022 [facility] Junior Puertas [id number]  Disposition: Per Molli Barrows, FNP patient is recommended for inpatient treatment.   [place] ED from 08/11/2022 in [facility] ED from 04/22/2022 in [place] ED to Hosp-Admission (Discharged) from 01/27/2022 in [facility] 4E CV SURGICAL PROGRESSIVE CARE  C-SSRS RISK CATEGORY Moderate Risk No Risk No Risk      The patient demonstrates the following risk factors for suicide: Chronic risk factors for suicide include: psychiatric disorder of depression . Acute risk factors for suicide include: social withdrawal/isolation. Protective factors for this patient include: religious beliefs against suicide. Considering these factors, the overall suicide risk at this point appears to be moderate. Patient is appropriate for outpatient follow up.  Chief Complaint:   Jose [name] is a 61 year old male presenting to [facility] with [employer] reporting suicidal ideations with plan to OD on medications. Patient is a veteran receiving psychiatric services at the [place]. 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 Last night patient reports he had a really bad nightmare and he woke up sweating and he thoughts about overdosing on his medications. Patient reports he started to pray and God told him to call mental health.  Patient reports he met with his psychiatrist virtually on Thursday and he told him the issues he was having and as a result patient reports he was given medications and signed up for anger management classes. Patient reports he has been through anger management and depression classes twice and it has not helped. Patient does not have individual therapy. Patient denies history of inpatient treatment and reports he went to drug and alcohol treatment in [place] in 2012.  Patient reports he drinks about two-three beers a day and he denies any drug use. Patient lives alone and divorced about two years ago. Patient is on disability and denies legal issues or access to a firearm.   Patient is oriented x4, engaged, alert and cooperative during assessment. Patient eye contact and speech is normal, affect is depressed with congruent mood. Patient denies SI, HI, AVH with this Probation officer.   Chief Complaint  Patient presents with   Post-Traumatic Stress Disorder   Suicidal   Visit Diagnosis:  Suicidal ideations  Alcohol use disorder  Severe episode of recurrent major depressive disorder, with psychotic features ([place])  Alcohol-induced mood disorder (HCC)      CCA Screening, Triage and Referral (STR)  Patient Reported Information How did you hear about [place]? Self  What Is the Reason for Your Visit/Call Today? Nightmares, suicidal ideations with plan to OD, depression.  How Long Has This Been Causing You Problems? > than 6 months  What Do You Feel Would Help You the Most Today? Treatment for Depression or other mood problem; Social Support; Engineer, water   Have You Recently Had Any Thoughts About [place]? Yes  Are You Planning to Commit Suicide/Harm Yourself At This time? Yes   Have you Recently Had Thoughts About Hurting Someone [place]? Yes  Are You Planning to Harm Someone at This Time? No  Explanation: No data recorded  Have You Used Any Alcohol or Drugs in the Past 24 Hours? Yes  How Long Ago Did You Use Drugs or Alcohol? No data recorded What Did You Use and How Much? two beers   Do You Currently Have a Therapist/Psychiatrist? Yes  Name of Therapist/Psychiatrist: Pt  receives services at the [place] Recently Discharged From Any Office Practice or Programs? No  Explanation of Discharge From Practice/Program: No data recorded    CCA Screening Triage Referral Assessment Type of Contact: Face-to-Face  Telemedicine  Service Delivery:   Is this Initial or Reassessment? No data recorded Date Telepsych consult ordered in CH[facility]:  No data recorded Time Telepsych consult ordered in CH[facility]:  No data recorded Location of Assessment: [facility] Services  Provider Location: [facility] Assessment Services   Collateral Involvement: none   Does Patient Have a [place]? No  Legal Guardian Contact Information: No data recorded Copy of Legal Guardianship Form: No data recorded Legal Guardian Notified of Arrival: No data recorded Legal Guardian Notified of Pending Discharge: No data recorded If Minor and Not Living with Parent(s), Who has Custody? No data recorded Is CPS involved or ever been involved? Never  Is APS involved or ever been involved? Never   Patient Determined To Be At Risk for Harm To Self or Others Based on Review of Patient Reported Information or Presenting Complaint? No  Method: No data recorded Availability of Means: No data recorded Intent: No data recorded Notification Required: No data recorded Additional Information for Danger to Others Potential: No data recorded Additional Comments for Danger to Others Potential: No data recorded Are There Guns or Other Weapons in Your Home? No data recorded Types of Guns/Weapons: No data recorded Are These Weapons Safely Secured?                            No data recorded Who Could Verify You Are Able To Have These Secured: No data recorded Do You Have any Outstanding Charges, Pending Court Dates, Parole/Probation? No data recorded Contacted To Inform of Risk of Harm To Self or Others: Other: Comment (None)    Does Patient Present under Involuntary Commitment? No  IVC Papers Initial File Date: No data recorded  [place] of Residence: [place]   Patient Currently Receiving the Following Services: Medication Management   Determination of Need: Urgent (48 hours)   Options For Referral: Outpatient Therapy; Medication  Management; Facility-Based Crisis; [facility]; Partial Hospitalization; Intensive Outpatient Therapy     CCA Biopsychosocial Patient Reported Schizophrenia/Schizoaffective Diagnosis in Past: No   Strengths: Spiritual   Mental Health Symptoms Depression:   Change in energy/activity; Sleep (too much or little); Tearfulness; Worthlessness; Irritability   Duration of Depressive symptoms:  Duration of Depressive Symptoms: Greater than two weeks   Mania:   None   Anxiety:    Worrying   Psychosis:   None   Duration of Psychotic symptoms:    Trauma:   Difficulty staying/falling asleep; Irritability/anger   Obsessions:   None   Compulsions:   None   Inattention:   None   Hyperactivity/Impulsivity:   None   Oppositional/Defiant Behaviors:   None   Emotional Irregularity:   None   Other Mood/Personality Symptoms:  No data recorded   Mental Status Exam Appearance and self-care  Stature:   Average   Weight:   Average weight   Clothing:   Neat/clean; Age-appropriate   Grooming:   Normal   Cosmetic use:   None   Posture/gait:   Normal   Motor activity:   Not Remarkable   Sensorium  Attention:   Normal   Concentration:   Normal   Orientation:   Object; Person; Place; Situation  Recall/memory:   Normal   Affect and Mood  Affect:   Depressed   Mood:   Depressed   Relating  Eye contact:   Normal   Facial expression:   Depressed   Attitude toward examiner:   Cooperative   Thought and Language  Speech flow:  Clear and Coherent   Thought content:   Appropriate to Mood and Circumstances   Preoccupation:   None   Hallucinations:   Auditory   Organization:  No data recorded  [employer] of Knowledge:   Fair   Intelligence:   Average   Abstraction:   Normal   Judgement:   Fair   Art therapist:   Adequate   Insight:   Fair   Decision Making:   Normal   Social Functioning  Social  Maturity:   Responsible   Social Judgement:   Normal   Stress  Stressors:   Relationship   Coping Ability:   Normal   Skill Deficits:   None   Supports:   Support needed; Friends/Service system     Religion: Religion/Spirituality Are You A Religious Person?: Yes 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: Leisure / Recreation Do You Have Hobbies?: No  Exercise/Diet: Exercise/Diet Do You Exercise?: Yes What Type of Exercise Do You Do?: Run/Walk How Many Times a Week Do You Exercise?: 1-3 times a week Have You Gained or Lost A Significant Amount of Weight in the Past Six Months?: No Do You Follow a Special Diet?: No Do You Have Any Trouble Sleeping?: Yes Explanation of Sleeping Difficulties: Due to bad dreams   CCA Employment/Education Employment/Work Situation: Employment / Work Situation Employment Situation: On disability Why is Patient on Disability: medical How Long has Patient Been on Disability: unknown Patient's Job has Been Impacted by Current Illness: No Has Patient ever Been in the [employer]?: Yes (Describe in comment) Did You Receive Any Psychiatric Treatment/Services While in the Military?: No  Education: Education Is Patient Currently Attending School?: No Did You Nutritional therapist?: No Did You Have An Individualized Education Program (IIEP): No Did You Have Any Difficulty At School?: No Patient's Education Has Been Impacted by Current Illness: No   CCA Family/Childhood History Family and Relationship History: Family history Marital status: Divorced Divorced, when?: 2 years ago What types of issues is patient dealing with in the relationship?: unknown Additional relationship information: none Does patient have children?: No  Childhood History:  Childhood History By whom was/is the patient raised?: Both parents Did patient suffer any verbal/emotional/physical/sexual abuse as a child?: No Did patient suffer from  severe childhood neglect?: No Has patient ever been sexually abused/assaulted/raped as an adolescent or adult?: No Was the patient ever a victim of a crime or a disaster?: No Witnessed domestic violence?: No Has patient been affected by domestic violence as an adult?: No  Child/Adolescent Assessment:     CCA Substance Use Alcohol/Drug Use: Alcohol / Drug Use Pain Medications: See MAR Prescriptions: See MAR Over the Counter: See MAR History of alcohol / drug use?: Yes Longest period of sobriety (when/how long): unknown Negative Consequences of Use: Personal relationships Withdrawal Symptoms: None Substance #1 Name of Substance 1: Alcohol 1 - Amount (size/oz): 2-3 12 ounce beers 1 - Frequency: daily 1 - Duration: "for years" 1 - Last Use / Amount: 08/10/22/ 2-3 beers                       ASAM's:  Six Dimensions of Multidimensional  Assessment  Dimension 1:  Acute Intoxication and/or Withdrawal Potential:   Dimension 1:  Description of individual's past and current experiences of substance use and withdrawal: currently reports 2-3 beers daily  Dimension 2:  Biomedical Conditions and Complications:   Dimension 2:  Description of patient's biomedical conditions and  complications: unknown medical issues  Dimension 3:  Emotional, Behavioral, or Cognitive Conditions and Complications:  Dimension 3:  Description of emotional, behavioral, or cognitive conditions and complications: no complications known  Dimension 4:  Readiness to Change:  Dimension 4:  Description of Readiness to Change criteria: Pt continues to drink daily  Dimension 5:  Relapse, Continued use, or Continued Problem Potential:  Dimension 5:  Relapse, continued use, or continued problem potential critiera description: pt does not appear to want help with alcohol use  Dimension 6:  Recovery/Living Environment:  Dimension 6:  Recovery/Iiving environment criteria description: Pt lives alone  ASAM Severity Score:  ASAM's Severity Rating Score: 8  ASAM Recommended Level of Treatment: ASAM Recommended Level of Treatment: Level I Outpatient Treatment   Substance use Disorder (SUD)    Recommendations for Services/Supports/Treatments: Recommendations for Services/Supports/Treatments Recommendations For Services/Supports/Treatments: Individual Therapy  Discharge Disposition:    DSM5 Diagnoses: Patient Active Problem List   Diagnosis Date Noted   Abnormality of aortic valve 01/29/2022   Bradycardia 01/29/2022   Hypokalemia 01/29/2022   Unstable angina ([place]) 01/27/2022   Hypertensive urgency 02/11/2021   Chest pain 02/11/2021   NSTEMI (non-ST elevated myocardial infarction) ([place]) 08/22/2020   Acute coronary syndrome ([place]) 08/21/2020   Hypertensive emergency 08/21/2020   Depression    Elevated troponin    Nausea and vomiting    Suspected COVID-19 virus infection    Diarrhea 04/27/2019   Hypertensive retinopathy of both eyes 02/01/2019   Chest pain with moderate risk for cardiac etiology 08/30/2017   Chest pain with high risk for cardiac etiology 08/28/2017   CKD (chronic kidney disease) stage 3, GFR 30-59 ml/min 08/28/2017   COPD (chronic obstructive pulmonary disease) ([place]) 08/28/2017   CAD (coronary artery disease) 04/10/2016   Hyperlipidemia 04/10/2016   Benign prostatic hyperplasia 04/10/2016   Diabetes ([place]) 04/10/2016   Hyperglycemia 10/17/2015   Type 2 diabetes mellitus with complication, with long-term current use of insulin ([place]) 10/17/2015   Acute renal failure superimposed on stage 3 chronic kidney disease ([place]) 10/17/2015   Hyperkalemia 10/17/2015   Dyslipidemia associated with type 2 diabetes mellitus ([place]) 10/17/2015   Alcohol abuse    Cocaine abuse ([place]) 06/24/2015   Hyponatremia 05/12/2015   Hypertension      Referrals to Alternative Service(s): Referred to Alternative Service(s):   Place:   Date:   Time:    Referred to Alternative Service(s):   Place:   Date:    Time:    Referred to Alternative Service(s):   Place:   Date:   Time:    Referred to Alternative Service(s):   Place:   Date:   Time:     Jose [name], [facility]

## 2022-08-11 NOTE — ED Triage Notes (Signed)
Sent from [facility] who has a bed at [facility] he is suicidal with no plan but sodium was low so was sent to ER.

## 2022-08-11 NOTE — Group Note (Deleted)
[facility] LCSW Group Therapy Note    Group Date: 08/11/2022 Start Time:  1:00 PM End Time:  2:00 PM  Type of Therapy and Topic:  Group Therapy:  Overcoming Obstacles  Participation Level:  {[facility] PARTICIPATION LEVEL:22264:::1}  Mood:  Description of Group:   In this group patients will be encouraged to explore what they see as obstacles to their own wellness and recovery. They will be guided to discuss their thoughts, feelings, and behaviors related to these obstacles. The group will process together ways to cope with barriers, with attention given to specific choices patients can make. Each patient will be challenged to identify changes they are motivated to make in order to overcome their obstacles. This group will be process-oriented, with patients participating in exploration of their own experiences as well as giving and receiving support and challenge from other group members.  Therapeutic Goals: 1. Patient will identify personal and current obstacles as they relate to admission. 2. Patient will identify barriers that currently interfere with their wellness or overcoming obstacles.  3. Patient will identify feelings, thought process and behaviors related to these barriers. 4. Patient will identify two changes they are willing to make to overcome these obstacles:    Summary of Patient Progress   ***   Therapeutic Modalities:   Cognitive Behavioral Therapy Solution Focused Therapy Motivational Interviewing Relapse Prevention Therapy   Kendan Cornforth J Rielyn Krupinski, LCSW 

## 2022-08-11 NOTE — ED Notes (Signed)
Discharged

## 2022-08-12 ENCOUNTER — Other Ambulatory Visit: Payer: Self-pay

## 2022-08-12 ENCOUNTER — Telehealth (HOSPITAL_COMMUNITY): Payer: Self-pay

## 2022-08-12 ENCOUNTER — Telehealth: Payer: Self-pay | Admitting: *Deleted

## 2022-08-12 LAB — BASIC METABOLIC PANEL
Anion gap: 12 (ref 5–15)
Anion gap: 12 (ref 5–15)
BUN: 23 mg/dL (ref 8–23)
BUN: 30 mg/dL — ABNORMAL HIGH (ref 8–23)
CO2: 21 mmol/L — ABNORMAL LOW (ref 22–32)
CO2: 17 mmol/L — ABNORMAL LOW (ref 22–32)
Calcium: 10.2 mg/dL (ref 8.9–10.3)
Calcium: 10.3 mg/dL (ref 8.9–10.3)
Chloride: 95 mmol/L — ABNORMAL LOW (ref 98–111)
Chloride: 103 mmol/L (ref 98–111)
Creatinine, Ser: 1.66 mg/dL — ABNORMAL HIGH (ref 0.61–1.24)
Creatinine, Ser: 2.09 mg/dL — ABNORMAL HIGH (ref 0.61–1.24)
GFR, Estimated: 46 mL/min — ABNORMAL LOW (ref >60)
GFR, Estimated: 35 mL/min — ABNORMAL LOW (ref >60)
Glucose, Bld: 385 mg/dL — ABNORMAL HIGH (ref 70–99)
Glucose, Bld: 172 mg/dL — ABNORMAL HIGH (ref 70–99)
Potassium: 4.1 mmol/L (ref 3.5–5.1)
Potassium: 3.7 mmol/L (ref 3.5–5.1)
Sodium: 128 mmol/L — ABNORMAL LOW (ref 135–145)
Sodium: 132 mmol/L — ABNORMAL LOW (ref 135–145)

## 2022-08-12 LAB — CBG MONITORING, ED
Glucose-Capillary: 431 mg/dL — ABNORMAL HIGH (ref 70–99)
Glucose-Capillary: 288 mg/dL — ABNORMAL HIGH (ref 70–99)
Glucose-Capillary: 341 mg/dL — ABNORMAL HIGH (ref 70–99)
Glucose-Capillary: 367 mg/dL — ABNORMAL HIGH (ref 70–99)
Glucose-Capillary: 285 mg/dL — ABNORMAL HIGH (ref 70–99)
Glucose-Capillary: 187 mg/dL — ABNORMAL HIGH (ref 70–99)

## 2022-08-12 LAB — RESP PANEL BY RT-PCR (FLU A&B, COVID) ARPGX2
Influenza A by PCR: NEGATIVE
Influenza B by PCR: NEGATIVE
SARS Coronavirus 2 by RT PCR: NEGATIVE

## 2022-08-12 MED ORDER — AMLODIPINE BESYLATE 5 MG PO TABS
5.0000 mg | ORAL_TABLET | Freq: Once | ORAL | Status: AC
  Administered 2022-08-12: 5 mg via ORAL
  Filled 2022-08-12: qty 1

## 2022-08-12 MED ORDER — ISOSORBIDE MONONITRATE ER 30 MG PO TB24
30.0000 mg | ORAL_TABLET | Freq: Once | ORAL | Status: AC
  Administered 2022-08-12: 30 mg via ORAL
  Filled 2022-08-12: qty 1

## 2022-08-12 MED ORDER — LACTATED RINGERS IV BOLUS
1000.0000 mL | Freq: Once | INTRAVENOUS | Status: AC
  Administered 2022-08-12: 1000 mL via INTRAVENOUS

## 2022-08-12 MED ORDER — BRIMONIDINE TARTRATE 0.15 % OP SOLN
1.0000 [drp] | Freq: Two times a day (BID) | OPHTHALMIC | Status: DC
  Administered 2022-08-12 – 2022-08-14 (×2): 1 [drp] via OPHTHALMIC
  Filled 2022-08-12 (×2): qty 5

## 2022-08-12 MED ORDER — ASPIRIN 81 MG PO CHEW
81.0000 mg | CHEWABLE_TABLET | Freq: Once | ORAL | Status: AC
  Administered 2022-08-12: 81 mg via ORAL
  Filled 2022-08-12: qty 1

## 2022-08-12 MED ORDER — INSULIN ASPART 100 UNIT/ML IJ SOLN: 3.0000 [IU] | Freq: Once | INTRAMUSCULAR | Status: DC

## 2022-08-12 MED ORDER — AMLODIPINE BESYLATE 5 MG PO TABS
10.0000 mg | ORAL_TABLET | Freq: Every day | ORAL | Status: DC
  Administered 2022-08-13 – 2022-08-14 (×2): 10 mg via ORAL
  Filled 2022-08-12 (×2): qty 2

## 2022-08-12 MED ORDER — ALBUTEROL SULFATE HFA 108 (90 BASE) MCG/ACT IN AERS: 2.0000 | INHALATION_SPRAY | Freq: Four times a day (QID) | RESPIRATORY_TRACT | Status: DC | PRN

## 2022-08-12 MED ORDER — CARVEDILOL 3.125 MG PO TABS
6.2500 mg | ORAL_TABLET | Freq: Two times a day (BID) | ORAL | Status: DC
  Administered 2022-08-12 – 2022-08-14 (×3): 6.25 mg via ORAL
  Filled 2022-08-12 (×4): qty 2

## 2022-08-12 MED ORDER — ASPIRIN 81 MG PO CHEW
81.0000 mg | CHEWABLE_TABLET | Freq: Every day | ORAL | Status: DC
  Administered 2022-08-13 – 2022-08-14 (×2): 81 mg via ORAL
  Filled 2022-08-12 (×2): qty 1

## 2022-08-12 MED ORDER — CARVEDILOL 3.125 MG PO TABS
6.2500 mg | ORAL_TABLET | Freq: Once | ORAL | Status: AC
  Administered 2022-08-12: 6.25 mg via ORAL
  Filled 2022-08-12: qty 2

## 2022-08-12 MED ORDER — ADULT MULTIVITAMIN W/MINERALS CH
1.0000 | ORAL_TABLET | Freq: Every day | ORAL | Status: DC
  Administered 2022-08-12 – 2022-08-14 (×3): 1 via ORAL
  Filled 2022-08-12 (×3): qty 1

## 2022-08-12 MED ORDER — LACTATED RINGERS IV BOLUS: 1000.0000 mL | Freq: Once | INTRAVENOUS | Status: DC

## 2022-08-12 MED ORDER — MAGNESIUM OXIDE -MG SUPPLEMENT 400 (240 MG) MG PO TABS
400.0000 mg | ORAL_TABLET | Freq: Every day | ORAL | Status: DC
  Administered 2022-08-12 – 2022-08-14 (×3): 400 mg via ORAL
  Filled 2022-08-12 (×3): qty 1

## 2022-08-12 MED ORDER — HYDRALAZINE HCL 25 MG PO TABS
100.0000 mg | ORAL_TABLET | Freq: Two times a day (BID) | ORAL | Status: DC
  Administered 2022-08-12 – 2022-08-14 (×4): 100 mg via ORAL
  Filled 2022-08-12 (×4): qty 4

## 2022-08-12 MED ORDER — LATANOPROST 0.005 % OP SOLN
1.0000 [drp] | Freq: Every day | OPHTHALMIC | Status: DC
  Filled 2022-08-12 (×2): qty 2.5

## 2022-08-12 MED ORDER — CLOPIDOGREL BISULFATE 75 MG PO TABS
75.0000 mg | ORAL_TABLET | Freq: Every day | ORAL | Status: DC
  Administered 2022-08-12 – 2022-08-14 (×3): 75 mg via ORAL
  Filled 2022-08-12 (×3): qty 1

## 2022-08-12 MED ORDER — HYDRALAZINE HCL 25 MG PO TABS
100.0000 mg | ORAL_TABLET | Freq: Once | ORAL | Status: AC
  Administered 2022-08-12: 100 mg via ORAL
  Filled 2022-08-12: qty 4

## 2022-08-12 MED ORDER — TRAZODONE HCL 50 MG PO TABS
75.0000 mg | ORAL_TABLET | Freq: Every day | ORAL | Status: DC
  Administered 2022-08-12 – 2022-08-13 (×2): 75 mg via ORAL
  Filled 2022-08-12 (×2): qty 2

## 2022-08-12 MED ORDER — FUROSEMIDE 20 MG PO TABS
20.0000 mg | ORAL_TABLET | Freq: Every day | ORAL | Status: DC
  Administered 2022-08-13 – 2022-08-14 (×2): 20 mg via ORAL
  Filled 2022-08-12 (×2): qty 1

## 2022-08-12 MED ORDER — ACETAMINOPHEN 325 MG PO TABS: 650.0000 mg | ORAL_TABLET | ORAL | Status: DC | PRN

## 2022-08-12 MED ORDER — INSULIN GLARGINE-YFGN 100 UNIT/ML ~~LOC~~ SOLN
30.0000 [IU] | Freq: Every day | SUBCUTANEOUS | Status: DC
  Administered 2022-08-13 – 2022-08-14 (×2): 30 [IU] via SUBCUTANEOUS
  Filled 2022-08-12 (×2): qty 0.3

## 2022-08-12 MED ORDER — PANTOPRAZOLE SODIUM 40 MG PO TBEC
40.0000 mg | DELAYED_RELEASE_TABLET | Freq: Every day | ORAL | Status: DC
  Administered 2022-08-12 – 2022-08-14 (×3): 40 mg via ORAL
  Filled 2022-08-12 (×3): qty 1

## 2022-08-12 MED ORDER — DIVALPROEX SODIUM 250 MG PO DR TAB
250.0000 mg | DELAYED_RELEASE_TABLET | Freq: Every day | ORAL | Status: DC
  Administered 2022-08-12: 250 mg via ORAL
  Filled 2022-08-12 (×3): qty 1

## 2022-08-12 MED ORDER — SERTRALINE HCL 100 MG PO TABS
100.0000 mg | ORAL_TABLET | Freq: Every day | ORAL | Status: DC
  Administered 2022-08-12 – 2022-08-14 (×3): 100 mg via ORAL
  Filled 2022-08-12 (×3): qty 1

## 2022-08-12 MED ORDER — INSULIN ASPART 100 UNIT/ML IJ SOLN
0.0000 [IU] | Freq: Three times a day (TID) | INTRAMUSCULAR | Status: DC
  Administered 2022-08-12: 9 [IU] via SUBCUTANEOUS
  Administered 2022-08-13: 5 [IU] via SUBCUTANEOUS
  Administered 2022-08-13: 7 [IU] via SUBCUTANEOUS
  Administered 2022-08-13: 5 [IU] via SUBCUTANEOUS
  Administered 2022-08-14: 7 [IU] via SUBCUTANEOUS
  Administered 2022-08-14: 9 [IU] via SUBCUTANEOUS

## 2022-08-12 MED ORDER — MOMETASONE FURO-FORMOTEROL FUM 200-5 MCG/ACT IN AERO
2.0000 | INHALATION_SPRAY | Freq: Two times a day (BID) | RESPIRATORY_TRACT | Status: DC
  Administered 2022-08-13 – 2022-08-14 (×2): 2 via RESPIRATORY_TRACT
  Filled 2022-08-12 (×2): qty 8.8

## 2022-08-12 MED ORDER — ATORVASTATIN CALCIUM 80 MG PO TABS
80.0000 mg | ORAL_TABLET | Freq: Every day | ORAL | Status: DC
  Administered 2022-08-12 – 2022-08-14 (×3): 80 mg via ORAL
  Filled 2022-08-12: qty 1
  Filled 2022-08-12 (×2): qty 2

## 2022-08-12 MED ORDER — ISOSORBIDE MONONITRATE ER 30 MG PO TB24
30.0000 mg | ORAL_TABLET | Freq: Every day | ORAL | Status: DC
  Administered 2022-08-13 – 2022-08-14 (×2): 30 mg via ORAL
  Filled 2022-08-12 (×2): qty 1

## 2022-08-12 MED ORDER — INSULIN ASPART 100 UNIT/ML IJ SOLN
5.0000 [IU] | Freq: Once | INTRAMUSCULAR | Status: AC
  Administered 2022-08-12: 5 [IU] via SUBCUTANEOUS

## 2022-08-12 MED ORDER — INSULIN ASPART 100 UNIT/ML IJ SOLN
0.0000 [IU] | Freq: Every day | INTRAMUSCULAR | Status: DC
  Administered 2022-08-12 – 2022-08-13 (×2): 3 [IU] via SUBCUTANEOUS

## 2022-08-12 MED ORDER — DOXAZOSIN MESYLATE 4 MG PO TABS
4.0000 mg | ORAL_TABLET | Freq: Every day | ORAL | Status: DC
  Administered 2022-08-12: 4 mg via ORAL
  Filled 2022-08-12 (×4): qty 1

## 2022-08-12 NOTE — Discharge Instructions (Addendum)
You were seen in the emergency department for your high blood sugar. Your work-up showed no complications of your blood sugar being high.  We gave you fluids and insulin with improvement of your blood sugar.  You should resume taking your insulin as prescribed.  You should follow-up with your primary doctor within the next few days to have your blood sugars rechecked to determine if you need any changes to your medication treatment plan.  You should return to the emergency department if you have abdominal pain, repetitive vomiting, you are confused, or if you have any other new or concerning symptoms.  Discharge recommendations from psychiatry.  Patient is to take medications as prescribed by the [facility]. No medication changes made during this encounter. Discuss starting prazosin at bedtime for nightmares with your outpatient provider.  Please see information for follow-up appointment with psychiatry and therapy.  Follow up with the [facility] for medication management and therapy.  [place], [place],  [address] Phone: [phone]  Hospital follow up appointment on Thursday  08/21/22 @ 10am. [facility]  Please follow up with your primary care provider for all medical related needs.   Therapy: We recommend that patient participate in individual therapy to address mental health concerns.  Medications: The Patient is to contact a medical professional and/or outpatient provider to address any new side effects that develop. The patient should update outpatient providers of any new medications and/or medication changes.   Safety:  The patient should abstain from use of illicit substances/drugs and abuse of any medications. If symptoms worsen or do not continue to improve or if the patient becomes actively suicidal or homicidal then it is recommended that the patient return to the closest hospital emergency department, the [facility], or call 911 for further evaluation and treatment. National Suicide Prevention Lifeline 1-800-SUICIDE or 870-231-4169.  About 988 988 offers 24/7 access to trained crisis counselors who can help people experiencing mental health-related distress. People can call or text 988 or chat 988lifeline.org for themselves or if they are worried about a loved one who may need crisis support.

## 2022-08-12 NOTE — Progress Notes (Signed)
Pt was accepted to [facility] 08/13/22: [place]  Pt meets inpatient criteria per Molli Barrows, [place]  Attending Physician will be Dr. Jonelle Sports  Report can be called to: - [phone]  Pt can arrive after 8:00am  Care Team Notified: Sheldon Silvan, RN  Nadara Mode, [place] 08/12/2022 @ 6:29 PM

## 2022-08-12 NOTE — ED Provider Notes (Signed)
11:25 PM Called to patient's bedside for loss of consciousness. He is here for medical clearance for psych admission. Noted to be hyponatremic and hyperglycemic, improving and awaiting inpatient psych bed. He reports he began to feel lightheaded and dizzy. He was diaphoretic. He tried to get up because he was confused and he lowered himself to the ground where he had brief loss of consciousness. He denies any chest pains. No injuries or falls. He was moved back to the stretcher and to a room with a monitor. He is more awake now and states he is feeling better. Glucose check was 180s, he had been in the 200-300s earlier. He was noted to be orthostatic. Repeat BMP, EKG and LR bolus ordered. Will reassess.   11:59 PM Repeat BMP shows improved sodium and glucose, but Cr has increased slightly.   3:33 AM Patient's BP has normalized. Will place back in Psych Hold.    Truddie Hidden, MD 08/13/22 [phone]

## 2022-08-12 NOTE — Progress Notes (Signed)
Inpatient Behavioral Health   Pt meets inpatient criteria per Molli Barrows, FNP. There are no available beds at [facility]. Referral was sent to the following facilities;   Destination Service Provider Address Phone [facility]  [address], [place] [address] [place]  [facility]  [address] [place] [address] [phone] [facility]  [address] [place] [address] [phone] [place]  [place], [place] [address] [phone]  [facility]  [address] [place] [address] [place]  [facility]  [address], [place] [address] [phone]  [place] [place] [address] [phone]  [facility]  [address] [place] [address] [phone]  [facility]  [address] [place] [address] [phone] [facility]  [address] [place] [address] [phone]  [facility]  [address] [place] [address] [phone] [facility]  [place], [place] [address] [phone]  [facility]  [address] [place] [address] [phone]    Situation ongoing,  CSW will follow up.   Benjaman Kindler, MSW, LCSWA 08/12/2022  @ 5:27 PM

## 2022-08-12 NOTE — Inpatient Diabetes Management (Signed)
Inpatient Diabetes Program Recommendations  AACE/ADA: New Consensus Statement on Inpatient Glycemic Control (2015)  Target Ranges:  Prepandial:   less than 140 mg/dL      Peak postprandial:   less than 180 mg/dL (1-2 hours)      Critically ill patients:  140 - 180 mg/dL   Lab Results  Component Value Date   GLUCAP 431 (H) 08/12/2022   HGBA1C 7.6 (H) 08/11/2022    Review of Glycemic Control  Diabetes history: DM 2 Outpatient Diabetes medications: Lantus 24 units Daily Current orders for Inpatient glycemic control:  None in ED awaiting placement  Inpatient Diabetes Program Recommendations:    Glucose 431. Novolog 5 units given.  Awaiting transfer A1c 7.6% on 9/18  Consider the following when transferred: -  Semglee 24 units -  CBGs achs -  Novolog 0-15 units tid   Thanks,  Tama Headings RN, MSN, BC-ADM Inpatient Diabetes Coordinator Team Pager [phone] (8a-5p)

## 2022-08-12 NOTE — ED Provider Notes (Signed)
[facility] EMERGENCY DEPARTMENT Provider Note   CSN: [id number] Arrival date & time: 08/11/22  1732     History  Chief Complaint  Patient presents with  . Suicidal  . Abnormal Lab    Jose [name] is a 61 y.o. male.  Patient is a 61 year old male with a past medical history of hypertension, diabetes, CAD, alcohol use disorder, major depressive disorder presenting to the emergency department from [facility] for abnormal labs.  Patient was evaluated by behavioral health for suicidal ideations and was accepted to an inpatient bed, however on his lab evaluation he was found to be hyperglycemic and hyponatremic and was brought to the emergency department for further evaluation.  The patient states that he has not taken any of his medications in the last 2 days.  He states he otherwise is feeling well and denies any nausea, vomiting, abdominal pain, headaches.  He denies any fevers or chills.  The history is provided by the patient and medical records.  Abnormal Lab      Home Medications Prior to Admission medications   Medication Sig Start Date End Date Taking? Authorizing Provider  albuterol (PROVENTIL HFA;VENTOLIN HFA) 108 (90 BASE) MCG/ACT inhaler Inhale 2 puffs into the lungs every 6 (six) hours as needed for wheezing or shortness of breath.     [provider]  amLODipine (NORVASC) 10 MG tablet Take 1 tablet (10 mg total) by mouth daily. 03/12/21   [name], Wonda Cheng, MD  aspirin EC 81 MG tablet Take 81 mg by mouth daily. Swallow whole.    [provider]  atorvastatin (LIPITOR) 80 MG tablet Take 1 tablet (80 mg total) by mouth daily. 01/30/22   Sande Rives E, PA-C  brimonidine (ALPHAGAN) 0.2 % ophthalmic solution Place 1 drop into both eyes 2 (two) times daily.  04/11/15   [provider]  carvedilol (COREG) 6.25 MG tablet Take 1 tablet (6.25 mg total) by mouth 2 (two) times daily with a meal. 01/29/22   Darreld Mclean, PA-C   clopidogrel (PLAVIX) 75 MG tablet Take 1 tablet (75 mg total) by mouth daily with breakfast. 01/30/22   Sande Rives E, PA-C  divalproex (DEPAKOTE ER) 250 MG 24 hr tablet Take 250 mg by mouth at bedtime. 08/07/22   [provider]  doxazosin (CARDURA) 8 MG tablet Take 4 mg by mouth at bedtime. 04/15/20   [provider]  fluticasone-salmeterol (ADVAIR) 500-50 MCG/ACT AEPB Inhale 1 puff into the lungs in the morning and at bedtime. 03/15/22   [provider]  furosemide (LASIX) 20 MG tablet Take 1 tablet by mouth every other day. 09/21/20   [provider]  glucose 4 GM chewable tablet Chew 4 g by mouth daily as needed for low blood sugar.    [provider]  glucose 4 GM chewable tablet Chew 1 tablet (4 g total) by mouth as needed for low blood sugar (Blood sugars less than 70). 08/11/22   Scot Jun, FNP  hydrALAZINE (APRESOLINE) 100 MG tablet Take 1 tablet (100 mg total) by mouth 3 (three) times daily. Patient taking differently: Take 100 mg by mouth 2 (two) times daily. 03/12/21   [name], Wonda Cheng, MD  hydrOXYzine (ATARAX) 25 MG tablet Take 1 tablet (25 mg total) by mouth 3 (three) times daily as needed for anxiety. 08/11/22   Scot Jun, FNP  hydrOXYzine (VISTARIL) 25 MG capsule Take 25 mg by mouth daily as needed for itching. 06/20/20   [provider]  insulin glargine (LANTUS) 100 UNIT/ML injection Inject 0.3 mLs (30 Units total) into the skin daily before breakfast. Patient taking differently: Inject 24 Units into the skin daily before breakfast. 08/30/17   Domenic Polite, MD  insulin glargine-yfgn (SEMGLEE) 100 UNIT/ML injection Inject 0.3 mLs (30 Units total) into the skin daily with breakfast. 08/12/22   Scot Jun, FNP  isosorbide mononitrate (IMDUR) 30 MG 24 hr tablet Take 1 tablet (30 mg total) by mouth daily. 01/30/22   Sande Rives E, PA-C  latanoprost (XALATAN) 0.005 % ophthalmic solution Place 1 drop into  both eyes at bedtime.  04/11/15   [provider]  magnesium oxide (MAG-OX) 400 (240 Mg) MG tablet Take 1 tablet (400 mg total) by mouth daily. 08/12/22   Scot Jun, FNP  Magnesium Oxide 420 MG TABS Take 420 mg by mouth daily. 08/22/20   [provider]  Multiple Vitamin (MULTIVITAMIN WITH MINERALS) TABS tablet Take 1 tablet by mouth daily.    [provider]  nitroGLYCERIN (NITROSTAT) 0.4 MG SL tablet Place 1 tablet (0.4 mg total) under the tongue every 5 (five) minutes as needed for chest pain. 01/29/22   Sande Rives E, PA-C  pantoprazole (PROTONIX) 40 MG tablet Take 1 tablet (40 mg total) by mouth daily. 01/29/22 08/11/22  Sande Rives E, PA-C  sertraline (ZOLOFT) 100 MG tablet Take 50 mg by mouth daily after breakfast. 08/07/22   [provider]  sertraline (ZOLOFT) 50 MG tablet Take 1 tablet (50 mg total) by mouth daily. 08/12/22   Scot Jun, FNP  traZODone (DESYREL) 100 MG tablet Take 1 tablet (100 mg total) by mouth at bedtime. 08/11/22   Scot Jun, FNP  traZODone (DESYREL) 150 MG tablet Take 75 mg by mouth at bedtime.     [provider]      Allergies    Lisinopril-hydrochlorothiazide and Simvastatin    Review of Systems   Review of Systems  Physical Exam Updated Vital Signs BP (!) 179/95   Pulse 75   Temp 98.4 F (36.9 C) (Oral)   Resp 16   Ht 5\' 6"  (1.676 m)   Wt 71.2 kg   SpO2 96%   BMI 25.34 kg/m  Physical Exam Vitals and nursing note reviewed.  Constitutional:      General: He is not in acute distress.    Appearance: Normal appearance.  HENT:     Head: Normocephalic and atraumatic.     Nose: Nose normal.     Mouth/Throat:     Mouth: Mucous membranes are moist.     Pharynx: Oropharynx is clear.  Eyes:     Extraocular Movements: Extraocular movements intact.     Conjunctiva/sclera: Conjunctivae normal.  Cardiovascular:     Rate and Rhythm: Normal rate and regular rhythm.     Pulses: Normal  pulses.     Heart sounds: Normal heart sounds.  Pulmonary:     Effort: Pulmonary effort is normal.     Breath sounds: Normal breath sounds.  Abdominal:     General: Abdomen is flat.     Palpations: Abdomen is soft.     Tenderness: There is no abdominal tenderness.  Musculoskeletal:        General: Normal range of motion.     Cervical back: Normal range of motion and neck supple.  Skin:    General: Skin is warm and dry.  Neurological:     General: No focal deficit present.  Mental Status: He is alert and oriented to person, place, and time.  Psychiatric:        Mood and Affect: Mood normal.        Behavior: Behavior normal.     ED Results / Procedures / Treatments   Labs (all labs ordered are listed, but only abnormal results are displayed) Labs Reviewed  CBC WITH DIFFERENTIAL/PLATELET - Abnormal; Notable for the following components:      Result Value   MCV 75.8 (*)    MCHC 37.5 (*)    All other components within normal limits  COMPREHENSIVE METABOLIC PANEL - Abnormal; Notable for the following components:   Sodium 123 (*)    Chloride 95 (*)    CO2 16 (*)    Glucose, Bld 399 (*)    BUN 24 (*)    Creatinine, Ser 1.72 (*)    GFR, Estimated 44 (*)    All other components within normal limits  CBG MONITORING, ED - Abnormal; Notable for the following components:   Glucose-Capillary 431 (*)    All other components within normal limits    EKG EKG Interpretation  Date/Time:  Monday August 11 2022 18:39:05 EDT Ventricular Rate:  86 PR Interval:  206 QRS Duration: 84 QT Interval:  346 QTC Calculation: 414 R Axis:   78 Text Interpretation: Sinus rhythm with frequent Premature ventricular complexes Otherwise normal ECG pvcs new since earlier in the day Confirmed by Sherwood Gambler [phone]) on 08/12/2022 7:40:56 AM  Radiology No results found.  Procedures Procedures    Medications Ordered in ED Medications  insulin aspart (novoLOG) injection 5 Units (has no  administration in time range)  lactated ringers bolus 1,000 mL (1,000 mLs Intravenous New Bag/Given 08/12/22 0821)  aspirin chewable tablet 81 mg (81 mg Oral Given 08/12/22 0821)  amLODipine (NORVASC) tablet 5 mg (5 mg Oral Given 08/12/22 0821)  carvedilol (COREG) tablet 6.25 mg (6.25 mg Oral Given 08/12/22 [id number])    ED Course/ Medical Decision Making/ A&P Clinical Course as of 08/12/22 [place] Aug 12, 2022  1056 Patient's labs show improving glucose, sodium and Cr. Corrected Na 133 for hyperglycemia. He has no signs of DKA. He is medically cleared for psych admission at [facility]. [VK]  1332 Report was attempted to be called for [facility] and his bed was not held. We will consult TOC to reinitiate psych work up and evaluation  [VK]    Clinical Course User Index [VK] Ottie Glazier, DO                           Medical Decision Making This patient presents to the ED with chief complaint(s) of abnormal labs with pertinent past medical history of diabetes, hypertension, CAD, MDD, alcohol use disorder which further complicates the presenting complaint. The complaint involves an extensive differential diagnosis and also carries with it a high risk of complications and morbidity.    The differential diagnosis includes labs reviewed and the patient was found to be hyperglycemic, hyponatremic and with a mild AKI.  Hyperglycemia is likely due to missed medication, he is no symptoms of any infectious etiology or acute stressors, no evidence of DKA on initial work-up and is no mental status change making HHS unlikely, hyponatremia likely secondary to hyperglycemia and alcohol use disorder  Additional history obtained: Additional history obtained from N/A Records reviewed behavioral health records  ED Course and Reassessment: Patient was initially evaluated by provider in triage and  had repeat labs performed here.  He was noted to be hyperglycemic and hyponatremic with a stable creatinine.  His corrected  sodium is 128 and is thought to likely be due to his alcohol use disorder.  He will be fluid resuscitated and have repeat labs performed.  No evidence of DKA or HHS on work-up or exam.  Independent labs interpretation:  The following labs were independently interpreted: Hyperglycemia and hyponatremia without evidence of DKA  Independent visualization of imaging: N/A  Consultation: - Consulted or discussed management/test interpretation w/ external professional: N/A  Consideration for admission or further workup: Patient does not require admission or further work-up for his hyperglycemia.  He is medically cleared for admission to inpatient psychiatry and has a bed held for him at [facility] behavioral health Social Determinants of health: Substance use    Amount and/or Complexity of Data Reviewed Labs: ordered.  Risk OTC drugs. Prescription drug management.          Final Clinical Impression(s) / ED Diagnoses Final diagnoses:  None    Rx / DC Orders ED Discharge Orders     None         Ottie Glazier, DO 08/12/22 1206

## 2022-08-12 NOTE — ED Notes (Addendum)
This RN attempted to call [facility] to give report per orders, as chart indicates that bed at [facility] was to be held for 24 hours, per a note from 9/18 at [place] "[facility] will hold bed for 24 hours to allow patient's labs to stabilize. -Molli Barrows, FNP".  Whitney RN at [facility] reports that no beds are available on the unit, and no bed was held for pt.

## 2022-08-12 NOTE — ED Notes (Signed)
Pt moved into room for EKG. Orthostatic blood pressure obtained per verbal order from Karle Starch MD, BP 69/54 upon sitting. Iv access obtained, 1L LR started and bmp sent down to the lab.

## 2022-08-12 NOTE — ED Notes (Signed)
RN alerted by pt's sitter that pt was trying to lay on the floor. Per Building services engineer - pt did not fall, he got down on the ground. Havilland MD at bedside. Pt diaphoretic and lethargic. Pt reports feeling like his sugar was low - cbg 187. Pt given orange juice and attempted to sit up and pt had LOC. Haviland MD laid pt back on the ground, pt placed on back board and lifted onto stretcher. Vitals assessed.

## 2022-08-12 NOTE — BH Assessment (Signed)
Care Management - Follow Up [facility] Discharges   Patient has been placed in an inpatient psychiatric hospital [facility]) on 08-11-2022.Marland Kitchen

## 2022-08-12 NOTE — Telephone Encounter (Signed)
Pharmacy called for medication verification

## 2022-08-13 DIAGNOSIS — R45851 Suicidal ideations: Secondary | ICD-10-CM | POA: Diagnosis not present

## 2022-08-13 DIAGNOSIS — R55 Syncope and collapse: Secondary | ICD-10-CM | POA: Diagnosis not present

## 2022-08-13 DIAGNOSIS — E1165 Type 2 diabetes mellitus with hyperglycemia: Secondary | ICD-10-CM | POA: Diagnosis not present

## 2022-08-13 LAB — CBG MONITORING, ED
Glucose-Capillary: 255 mg/dL — ABNORMAL HIGH (ref 70–99)
Glucose-Capillary: 257 mg/dL — ABNORMAL HIGH (ref 70–99)
Glucose-Capillary: 318 mg/dL — ABNORMAL HIGH (ref 70–99)
Glucose-Capillary: 265 mg/dL — ABNORMAL HIGH (ref 70–99)

## 2022-08-13 NOTE — ED Notes (Signed)
We are waiting  for a  room the pt is asking if he can just leave  he feels better   and he does not know why he does not have a bed

## 2022-08-13 NOTE — ED Notes (Signed)
Morning BH NP will reassess patient about ability to discharge.

## 2022-08-13 NOTE — Progress Notes (Signed)
CSW spoke the Intake RN Jonelle Sidle from [facility] regarding acceptance for this patient. Per Jonelle Sidle, the patient's bed is still available and can arrive tomorrow. CSW informed RN with updated information via secured chat.   Mariea Clonts, MSW, LCSW-A  8:56 PM 08/13/2022

## 2022-08-13 NOTE — ED Provider Notes (Signed)
Emergency Medicine Observation Re-evaluation Note  Jose [name] is a 61 y.o. male, seen on rounds today.  Pt initially presented to the ED for complaints of Suicidal and Abnormal Lab Currently, the patient is feeling improved since last evening after having lightheaded/syncopal episode which fortunately resolved.  Patient's glucose had lowered to 180s and what had been running 300s.  Patient improved with IV fluids.  Physical Exam  BP (!) 149/85 (BP Location: Right Arm)   Pulse 75   Temp 98.8 F (37.1 C) (Oral)   Resp 17   Ht 5\' 6"  (1.676 m)   Wt 71.2 kg   SpO2 98%   BMI 25.34 kg/m  Physical Exam General: Conversant, overall well-appearing Cardiac: Normal heart rate Lungs: Normal work of breathing Psych: No agitation or aggression, SI  ED Course / MDM  EKG:EKG Interpretation  Date/Time:  Monday August 11 2022 18:39:05 EDT Ventricular Rate:  86 PR Interval:  206 QRS Duration: 84 QT Interval:  346 QTC Calculation: 414 R Axis:   78 Text Interpretation: Sinus rhythm with frequent Premature ventricular complexes Otherwise normal ECG pvcs new since earlier in the day Confirmed by Sherwood Gambler [phone]) on 08/12/2022 7:40:56 AM  I have reviewed the labs performed to date as well as medications administered while in observation.  Recent changes in the last 24 hours include patient had syncopal/orthostatic episode that has resolved, creatinine elevated 2.0 patient has been running in the high ones, hemoglobin 15 on last blood work on the 18th.  Patient feels improved with IV and oral fluids.  Plan  Current plan is for continue to monitor until placement, patient was excepted for inpatient previously.Elnora Morrison, MD 08/13/22 1135

## 2022-08-13 NOTE — ED Notes (Signed)
Pt diet changed to Heart Healthy

## 2022-08-13 NOTE — ED Notes (Signed)
RN hasn't received call back from [facility]. LCSW notified

## 2022-08-13 NOTE — ED Notes (Signed)
Pt is voluntary with a possible tx to another facility

## 2022-08-13 NOTE — ED Notes (Signed)
Voluntary form attached to the clipboard in green zone. 

## 2022-08-13 NOTE — ED Notes (Signed)
Pt eating breakfast at bedside.

## 2022-08-13 NOTE — ED Notes (Signed)
Jeanette from [facility] called to get update on pt.

## 2022-08-13 NOTE — ED Notes (Signed)
Patient informed of [facility] bed placement and plan for them to accept patient tomorrow (9/21) after 0900. Patient states that he wants to speak with [facility] NP about this decision and would rather f/u outpatient and get resources. LCSW made aware of this situation and trying to figure out how to pass along this information to psychiatry team at this point.

## 2022-08-13 NOTE — ED Notes (Addendum)
RN attempted to give report via paging system to [facility]

## 2022-08-13 NOTE — ED Notes (Signed)
Pt voluntary form is now attached to the clipboard in purple zone

## 2022-08-14 DIAGNOSIS — F515 Nightmare disorder: Secondary | ICD-10-CM

## 2022-08-14 DIAGNOSIS — F4310 Post-traumatic stress disorder, unspecified: Secondary | ICD-10-CM

## 2022-08-14 DIAGNOSIS — F4312 Post-traumatic stress disorder, chronic: Secondary | ICD-10-CM

## 2022-08-14 LAB — CBG MONITORING, ED
Glucose-Capillary: 311 mg/dL — ABNORMAL HIGH (ref 70–99)
Glucose-Capillary: 359 mg/dL — ABNORMAL HIGH (ref 70–99)

## 2022-08-14 NOTE — Consult Note (Signed)
Telepsych Consultation   Reason for Consult:  Pysch consult  Referring Physician:  Ottie Glazier, DO Location of Patient: [place] Location of Provider: [facility]  Patient Identification: Jose [name] MRN:  [id number] Principal Diagnosis: Nightmares associated with chronic post-traumatic stress disorder Diagnosis:  Principal Problem:   Nightmares associated with chronic post-traumatic stress disorder Active Problems:   PTSD (post-traumatic stress disorder)   Total Time spent with patient: 30 minutes  Subjective:   Jose [name] is a 61 y.o. male patient admitted was admitted to the [facility] on 08/11/22 for medical clearance after he was transferred from the [facility] with c/o nightmares and SI with a plan to OD. Per chart review, he was initially accepted to [facility] unit, however labs were abnormal, low NA, glucose> 350, and creatinine elevated slightly higher than baseline.   HPI: Patient seen and reevaluated face-to-face by this provider, chart reviewed and case discussed with Dr. Lovette Cliche. On evaluation, patient is alert and oriented x 4. His thought process is logical and relevant. His speech is clear and coherent. His mood is euthymic and affect is congruent. He is calm and  cooperative.  Patient was scheduled to transfer to [facility] today for inpatient psychiatric treatment. Per nursing, patient requested to speak to the psychiatry team to discuss discharge with a plan to follow-up with outpatient services with the [place]. Today, patient denies suicidal ideations. He denies past suicide attempts. He tells me that the suicidal thoughts were triggered that day by him having a bad nightmare that he believes was triggered by him starting a new medication to help with his anger. He states that he was in the [employer] from 1978 until 1986 and that he has been experiencing nightmares every since then. He states that while he was in the [employer] he was [place]  and that the people were always trying to fight the soldiers. He states that he has not experienced anymore nightmares since he stopped taking the medication and has been in the hospital. He is unable to recall the name of the specific medication that he was recently started on by his psychiatrist that he saw remotely that is connected with the [place].  Patient denies homicidal ideations.  He denies auditory and visual hallucinations. There is no objective evidence that the patient is currently responding to internal or external stimuli.  He reports good sleep. He reports a fair appetite. He denies depressive symptoms. He currently denies medication side effects.  Patient lives alone. He states that he has supportive nieces that checks in on him daily. He denies access to weapons in the home including guns. He reports occasional alcohol use but states that he is working with his doctor to stop drinking alcohol. He states that he attends AA meetings 2 times a week. He has outpatient services with the [place] in [place].   [facility] [place] [facility]. He was advised to speak with his outpatient provider about starting prazosin for nightmares. I contacted to the [place] to schedule the patient a f/u appointment for mediation management. He has a follow-up appointment scheduled for Thursday  08/21/22 @ 10 am at the [facility]. He identifies praying, reading the Bible, and support from his nieces as ways to cope with the nightmares.  Patient gave verbal consent for this provider to speak with his niece Kahler Wehbe [phone], to safety plan.  However, she did not answer. This provider left Hippa  compliant voicemail.   I discussed with the patient that if his symptoms worsen to present to the [facility] behavioral health urgent care, or the nearest emergency department, or call 911 for an evaluation.  Patient verbalizes understanding.  Past  Psychiatric History: A documented history of PTSD, mood disorder, alcohol abuse and cocaine abuse.   Risk to Self:  denies  Risk to Others:  denies  Prior Inpatient Therapy:  no Prior Outpatient Therapy:  yes  Past Medical History:  Past Medical History:  Diagnosis Date   Alcohol abuse    CAD (coronary artery disease)    a. Reported MI in 2012 s/p 2 stents;  b. 08/2012 Cath: LM 20, LAD 20 diff ISR, jailed septal - 99%, LCX 30ost, RI large, min irregs, RCA 30p, 20-30 ISR-->Med Rx; c. 2017 [facility]   CKD (chronic kidney disease), stage III (HCC)    Cocaine abuse (HCC)    COPD (chronic obstructive pulmonary disease) (HCC)    Depression    Diabetes mellitus without complication (HCC)    Hyperlipidemia    Hypertension    Hypertensive urgency 02/11/2021   Noncompliance     Past Surgical History:  Procedure Laterality Date   CARDIAC CATHETERIZATION     CORONARY STENT PLACEMENT     LEFT HEART CATH AND CORONARY ANGIOGRAPHY N/A 08/22/2020   Procedure: LEFT HEART CATH AND CORONARY ANGIOGRAPHY;  Surgeon: Lyn Records, MD;  Location: [facility] INVASIVE CV LAB;  Service: Cardiovascular;  Laterality: N/A;   LEFT HEART CATH AND CORONARY ANGIOGRAPHY N/A 01/28/2022   Procedure: LEFT HEART CATH AND CORONARY ANGIOGRAPHY;  Surgeon: Lennette Bihari, MD;  Location: [facility] INVASIVE CV LAB;  Service: Cardiovascular;  Laterality: N/A;   LEFT HEART CATHETERIZATION WITH CORONARY ANGIOGRAM Bilateral 08/25/2012   Procedure: LEFT HEART CATHETERIZATION WITH CORONARY ANGIOGRAM;  Surgeon: Kathleene Hazel, MD;  Location: [facility] CATH LAB;  Service: Cardiovascular;  Laterality: Bilateral;   Family History:  Family History  Problem Relation Age of Onset   Heart disease Mother        MI 69s   Family Psychiatric  History: No history reported. Social History:  Social History   Substance and Sexual Activity  Alcohol Use Yes   Comment: Used to drink heavily - says currently 2 beers/day.     Social  History   Substance and Sexual Activity  Drug Use Yes   Types: Marijuana   Comment: reports not using cocaine    Social History   Socioeconomic History   Marital status: Married    Spouse name: Not on file   Number of children: Not on file   Years of education: Not on file   Highest education level: Not on file  Occupational History   Not on file  Tobacco Use   Smoking status: Former    Types: Cigarettes    Quit date: 11/25/2007    Years since quitting: 14.7   Smokeless tobacco: Never  Vaping Use   Vaping Use: Never used  Substance and Sexual Activity   Alcohol use: Yes    Comment: Used to drink heavily - says currently 2 beers/day.   Drug use: Yes    Types: Marijuana    Comment: reports not using cocaine   Sexual activity: Yes    Birth control/protection: None  Other Topics Concern   Not on file  Social History Narrative   Lives in [place] by himself.  Does not work or routinely exercise.  Social Determinants of Health   Financial Resource Strain: Not on file  Food Insecurity: No Food Insecurity (07/25/2022)   Hunger Vital Sign    Worried About Running Out of Food in the Last Year: Never true    Ran Out of Food in the Last Year: Never true  Transportation Needs: No Transportation Needs (07/25/2022)   PRAPARE - Hydrologist (Medical): No    Lack of Transportation (Non-Medical): No  Physical Activity: Not on file  Stress: Not on file  Social Connections: Not on file   Additional Social History:    Allergies:   Allergies  Allergen Reactions   Lisinopril-Hydrochlorothiazide Swelling    Causes swelling of lips   Simvastatin Other (See Comments)    ALT (SGPT) level raised, Aspartate aminotransferase serum level raised    Labs:  Results for orders placed or performed during the hospital encounter of 08/11/22 (from the past 48 hour(s))  Resp Panel by RT-PCR (Flu A&B, Covid) Anterior Nasal Swab     Status: None   Collection Time: 08/12/22   2:35 PM   Specimen: Anterior Nasal Swab  Result Value Ref Range   SARS Coronavirus 2 by RT PCR NEGATIVE NEGATIVE    Comment: (NOTE) SARS-CoV-2 target nucleic acids are NOT DETECTED.  The SARS-CoV-2 RNA is generally detectable in upper respiratory specimens during the acute phase of infection. The lowest concentration of SARS-CoV-2 viral copies this assay can detect is 138 copies/mL. A negative result does not preclude SARS-Cov-2 infection and should not be used as the sole basis for treatment or other patient management decisions. A negative result may occur with  improper specimen collection/handling, submission of specimen other than nasopharyngeal swab, presence of viral mutation(s) within the areas targeted by this assay, and inadequate number of viral copies(<138 copies/mL). A negative result must be combined with clinical observations, patient history, and epidemiological information. The expected result is Negative.  Fact Sheet for Patients:  EntrepreneurPulse.com.au  Fact Sheet for Healthcare Providers:  IncredibleEmployment.be  This test is no t yet approved or cleared by the [place] FDA and  has been authorized for detection and/or diagnosis of SARS-CoV-2 by FDA under an Emergency Use Authorization (EUA). This EUA will remain  in effect (meaning this test can be used) for the duration of the COVID-19 declaration under Section 564(b)(1) of the Act, 21 U.S.C.section 360bbb-3(b)(1), unless the authorization is terminated  or revoked sooner.       Influenza A by PCR NEGATIVE NEGATIVE   Influenza B by PCR NEGATIVE NEGATIVE    Comment: (NOTE) The Xpert Xpress SARS-CoV-2/FLU/RSV plus assay is intended as an aid in the diagnosis of influenza from Nasopharyngeal swab specimens and should not be used as a sole basis for treatment. Nasal washings and aspirates are unacceptable for Xpert Xpress SARS-CoV-2/FLU/RSV testing.  Fact  Sheet for Patients: EntrepreneurPulse.com.au  Fact Sheet for Healthcare Providers: IncredibleEmployment.be  This test is not yet approved or cleared by the [place] FDA and has been authorized for detection and/or diagnosis of SARS-CoV-2 by FDA under an Emergency Use Authorization (EUA). This EUA will remain in effect (meaning this test can be used) for the duration of the COVID-19 declaration under Section 564(b)(1) of the Act, 21 U.S.C. section 360bbb-3(b)(1), unless the authorization is terminated or revoked.  Performed at [facility], [place] [address] [place], [place] [address]   CBG monitoring, ED     Status: Abnormal   Collection Time: 08/12/22  5:19 PM  Result Value Ref Range   Glucose-Capillary 341 (H) 70 - 99 mg/dL    Comment: Glucose reference range applies only to samples taken after fasting for at least 8 hours.   Comment 1 Notify RN    Comment 2 Document in Chart   CBG monitoring, ED     Status: Abnormal   Collection Time: 08/12/22  6:45 PM  Result Value Ref Range   Glucose-Capillary 367 (H) 70 - 99 mg/dL    Comment: Glucose reference range applies only to samples taken after fasting for at least 8 hours.  CBG monitoring, ED     Status: Abnormal   Collection Time: 08/12/22  9:20 PM  Result Value Ref Range   Glucose-Capillary 285 (H) 70 - 99 mg/dL    Comment: Glucose reference range applies only to samples taken after fasting for at least 8 hours.  CBG monitoring, ED     Status: Abnormal   Collection Time: 08/12/22 11:05 PM  Result Value Ref Range   Glucose-Capillary 187 (H) 70 - 99 mg/dL    Comment: Glucose reference range applies only to samples taken after fasting for at least 8 hours.  Basic metabolic panel     Status: Abnormal   Collection Time: 08/12/22 11:20 PM  Result Value Ref Range   Sodium 132 (L) 135 - 145 mmol/L   Potassium 3.7 3.5 - 5.1 mmol/L   Chloride 103 98 - 111 mmol/L   CO2 17 (L) 22 - 32 mmol/L    Glucose, Bld 172 (H) 70 - 99 mg/dL    Comment: Glucose reference range applies only to samples taken after fasting for at least 8 hours.   BUN 30 (H) 8 - 23 mg/dL   Creatinine, Ser 2.09 (H) 0.61 - 1.24 mg/dL   Calcium 10.3 8.9 - 10.3 mg/dL   GFR, Estimated 35 (L) >60 mL/min    Comment: (NOTE) Calculated using the CKD-EPI Creatinine Equation (2021)    Anion gap 12 5 - 15    Comment: Performed at [facility] [address] [place], [place] [address]  CBG monitoring, ED     Status: Abnormal   Collection Time: 08/13/22  7:41 AM  Result Value Ref Range   Glucose-Capillary 255 (H) 70 - 99 mg/dL    Comment: Glucose reference range applies only to samples taken after fasting for at least 8 hours.  CBG monitoring, ED     Status: Abnormal   Collection Time: 08/13/22 12:27 PM  Result Value Ref Range   Glucose-Capillary 257 (H) 70 - 99 mg/dL    Comment: Glucose reference range applies only to samples taken after fasting for at least 8 hours.  CBG monitoring, ED     Status: Abnormal   Collection Time: 08/13/22  6:25 PM  Result Value Ref Range   Glucose-Capillary 318 (H) 70 - 99 mg/dL    Comment: Glucose reference range applies only to samples taken after fasting for at least 8 hours.  CBG monitoring, ED     Status: Abnormal   Collection Time: 08/13/22  9:45 PM  Result Value Ref Range   Glucose-Capillary 265 (H) 70 - 99 mg/dL    Comment: Glucose reference range applies only to samples taken after fasting for at least 8 hours.  CBG monitoring, ED     Status: Abnormal   Collection Time: 08/14/22  8:43 AM  Result Value Ref Range   Glucose-Capillary 311 (H) 70 - 99 mg/dL    Comment: Glucose reference range  applies only to samples taken after fasting for at least 8 hours.    Medications:  Current Facility-Administered Medications  Medication Dose Route Frequency Provider Last Rate Last Admin   acetaminophen (TYLENOL) tablet 650 mg  650 mg Oral Q4H PRN Kneller, Victoria K, DO        albuterol (VENTOLIN HFA) 108 (90 Base) MCG/ACT inhaler 2 puff  2 puff Inhalation Q6H PRN Kneller, Victoria K, DO       amLODipine (NORVASC) tablet 10 mg  10 mg Oral Daily Kneller, Victoria K, DO   10 mg at 08/14/22 [id number]   aspirin chewable tablet 81 mg  81 mg Oral Daily Kneller, Victoria K, DO   81 mg at 08/14/22 [id number]   atorvastatin (LIPITOR) tablet 80 mg  80 mg Oral Daily Kneller, Victoria K, DO   80 mg at 08/14/22 0925   brimonidine (ALPHAGAN) 0.15 % ophthalmic solution 1 drop  1 drop Both Eyes BID Kneller, Victoria K, DO   1 drop at 08/14/22 [id number]   carvedilol (COREG) tablet 6.25 mg  6.25 mg Oral BID WC Kneller, Victoria K, DO   6.25 mg at 08/14/22 0855   clopidogrel (PLAVIX) tablet 75 mg  75 mg Oral Daily Kneller, Victoria K, DO   75 mg at 08/14/22 [id number]   divalproex (DEPAKOTE) DR tablet 250 mg  250 mg Oral QHS Kneller, Victoria K, DO   250 mg at 08/12/22 2204   doxazosin (CARDURA) tablet 4 mg  4 mg Oral QHS Kneller, Victoria K, DO   4 mg at 08/12/22 2149   furosemide (LASIX) tablet 20 mg  20 mg Oral Daily Kneller, Victoria K, DO   20 mg at 08/14/22 [id number]   hydrALAZINE (APRESOLINE) tablet 100 mg  100 mg Oral BID Kneller, Victoria K, DO   100 mg at 08/14/22 0855   insulin aspart (novoLOG) injection 0-5 Units  0-5 Units Subcutaneous QHS Kneller, Jordan Hawks K, DO   3 Units at 08/13/22 2150   insulin aspart (novoLOG) injection 0-9 Units  0-9 Units Subcutaneous TID WC Kneller, Victoria K, DO   7 Units at 08/14/22 0856   insulin glargine-yfgn (SEMGLEE) injection 30 Units  30 Units Subcutaneous Daily Kneller, Jordan Hawks K, DO   30 Units at 08/13/22 [id number]   isosorbide mononitrate (IMDUR) 24 hr tablet 30 mg  30 mg Oral Daily Kneller, Victoria K, DO   30 mg at 08/14/22 0927   latanoprost (XALATAN) 0.005 % ophthalmic solution 1 drop  1 drop Both Eyes QHS Kneller, Victoria K, DO       magnesium oxide (MAG-OX) tablet 400 mg  400 mg Oral Daily Kneller, Victoria K, DO   400 mg at 08/14/22 [id number]   mometasone-formoterol  (DULERA) 200-5 MCG/ACT inhaler 2 puff  2 puff Inhalation BID Kneller, Victoria K, DO   2 puff at 08/14/22 0901   multivitamin with minerals tablet 1 tablet  1 tablet Oral Daily Kneller, Victoria K, DO   1 tablet at 08/14/22 0926   pantoprazole (PROTONIX) EC tablet 40 mg  40 mg Oral Daily Kneller, Victoria K, DO   40 mg at 08/14/22 0926   sertraline (ZOLOFT) tablet 100 mg  100 mg Oral Daily Kneller, Victoria K, DO   100 mg at 08/14/22 0926   traZODone (DESYREL) tablet 75 mg  75 mg Oral QHS Kneller, Jordan Hawks K, DO   75 mg at 08/13/22 2153   Current Outpatient Medications  Medication Sig Dispense Refill   albuterol (PROVENTIL HFA;VENTOLIN HFA) 108 (90 BASE)  MCG/ACT inhaler Inhale 2 puffs into the lungs every 6 (six) hours as needed for wheezing or shortness of breath.      amLODipine (NORVASC) 10 MG tablet Take 1 tablet (10 mg total) by mouth daily. 90 tablet 3   aspirin EC 81 MG tablet Take 81 mg by mouth daily. Swallow whole.     atorvastatin (LIPITOR) 80 MG tablet Take 1 tablet (80 mg total) by mouth daily. 30 tablet 2   brimonidine (ALPHAGAN) 0.2 % ophthalmic solution Place 1 drop into both eyes 2 (two) times daily.      carvedilol (COREG) 6.25 MG tablet Take 1 tablet (6.25 mg total) by mouth 2 (two) times daily with a meal. 60 tablet 2   clopidogrel (PLAVIX) 75 MG tablet Take 1 tablet (75 mg total) by mouth daily with breakfast. 30 tablet 5   divalproex (DEPAKOTE ER) 250 MG 24 hr tablet Take 250 mg by mouth at bedtime.     doxazosin (CARDURA) 8 MG tablet Take 4 mg by mouth at bedtime.     fluticasone-salmeterol (ADVAIR) 500-50 MCG/ACT AEPB Inhale 1 puff into the lungs in the morning and at bedtime.     furosemide (LASIX) 20 MG tablet Take 1 tablet by mouth every other day.     glucose 4 GM chewable tablet Chew 4 g by mouth daily as needed for low blood sugar.     glucose 4 GM chewable tablet Chew 1 tablet (4 g total) by mouth as needed for low blood sugar (Blood sugars less than 70). 50 tablet  12   hydrALAZINE (APRESOLINE) 100 MG tablet Take 1 tablet (100 mg total) by mouth 3 (three) times daily. (Patient taking differently: Take 100 mg by mouth 2 (two) times daily.) 90 tablet 11   hydrOXYzine (ATARAX) 25 MG tablet Take 1 tablet (25 mg total) by mouth 3 (three) times daily as needed for anxiety. 30 tablet 0   hydrOXYzine (VISTARIL) 25 MG capsule Take 25 mg by mouth daily as needed for itching.     insulin glargine (LANTUS) 100 UNIT/ML injection Inject 0.3 mLs (30 Units total) into the skin daily before breakfast. (Patient taking differently: Inject 22 Units into the skin daily before breakfast.)     isosorbide mononitrate (IMDUR) 30 MG 24 hr tablet Take 1 tablet (30 mg total) by mouth daily. 30 tablet 2   latanoprost (XALATAN) 0.005 % ophthalmic solution Place 1 drop into both eyes at bedtime.      Magnesium Oxide 420 MG TABS Take 420 mg by mouth daily.     Multiple Vitamin (MULTIVITAMIN WITH MINERALS) TABS tablet Take 1 tablet by mouth daily.     nitroGLYCERIN (NITROSTAT) 0.4 MG SL tablet Place 1 tablet (0.4 mg total) under the tongue every 5 (five) minutes as needed for chest pain. 25 tablet 2   sertraline (ZOLOFT) 100 MG tablet Take 50 mg by mouth daily after breakfast.     insulin glargine-yfgn (SEMGLEE) 100 UNIT/ML injection Inject 0.3 mLs (30 Units total) into the skin daily with breakfast. (Patient not taking: Reported on 08/12/2022) 10 mL 11   magnesium oxide (MAG-OX) 400 (240 Mg) MG tablet Take 1 tablet (400 mg total) by mouth daily. (Patient not taking: Reported on 08/12/2022)     pantoprazole (PROTONIX) 40 MG tablet Take 1 tablet (40 mg total) by mouth daily. (Patient not taking: Reported on 08/12/2022) 30 tablet 2   sertraline (ZOLOFT) 50 MG tablet Take 1 tablet (50 mg total) by mouth daily. (Patient not  taking: Reported on 08/12/2022)     traZODone (DESYREL) 100 MG tablet Take 1 tablet (100 mg total) by mouth at bedtime. (Patient not taking: Reported on 08/12/2022)     traZODone  (DESYREL) 150 MG tablet Take 75 mg by mouth at bedtime.  (Patient not taking: Reported on 08/12/2022)      Musculoskeletal: Strength & Muscle Tone: within normal limits Gait & Station: normal Patient leans: N/A    Psychiatric Specialty Exam:  Presentation  General Appearance: Appropriate for Environment  Eye Contact:Fair  Speech:Clear and Coherent  Speech Volume:Normal  Handedness:Right   Mood and Affect  Mood:Euthymic  Affect:Congruent   Thought Process  Thought Processes:Linear  Descriptions of Associations:Intact  Orientation:Full (Time, Place and Person)  Thought Content:Logical  History of Schizophrenia/Schizoaffective disorder:No  Duration of Psychotic Symptoms:No data recorded Hallucinations:Hallucinations: None  Ideas of Reference:None  Suicidal Thoughts:Suicidal Thoughts: No  Homicidal Thoughts:Homicidal Thoughts: No   Sensorium  Memory:Immediate Fair; Recent Fair; Remote Fair  Judgment:Fair  Insight:Fair   Executive Functions  Concentration:Fair  Attention Span:Fair  [place]   Psychomotor Activity  Psychomotor Activity:Psychomotor Activity: Normal   Assets  Assets:Communication Skills; Desire for Improvement; Financial Resources/Insurance; Housing; Leisure Time; Social Support; Physical Health   Sleep  Sleep:Sleep: Fair    Physical Exam: Physical Exam Cardiovascular:     Rate and Rhythm: Normal rate.     Comments: Hypertensive  Pulmonary:     Effort: Pulmonary effort is normal.  Neurological:     Mental Status: He is alert and oriented to person, place, and time.    Review of Systems  Constitutional: Negative.   HENT: Negative.    Eyes: Negative.   Respiratory: Negative.    Cardiovascular: Negative.   Gastrointestinal: Negative.   Genitourinary: Negative.   Musculoskeletal: Negative.   Skin: Negative.   Neurological: Negative.   Endo/Heme/Allergies: Negative.     Blood pressure (!) 163/78, pulse 82, temperature 98.6 F (37 C), temperature source Oral, resp. rate 18, height 5\' 6"  (1.676 m), weight 71.2 kg, SpO2 97 %. Body mass index is 25.34 kg/m.  Treatment Plan Summary: At this time the patient is stable for discharge and no longer meets criteria for inpatient psychiatric treatment. Patient denies nightmares, SI/HI/AVH. Patient to be discharged with outpatient recommendations for medication [facility]  Discharge recommendations from psychiatry.  Patient is to take medications as prescribed by the [facility]. No medication changes made during this encounter. Discuss starting prazosin for nightmares with your outpatient provider at the [facility].  Please see information for follow-up appointment with psychiatry and therapy.   Follow up with the [facility] for medication management and therapy.  [place], [place], [place] [address] Phone: [phone]  APPT: is on Thursday  08/21/22 @ 10am   Please follow up with your primary care provider for all medical related needs.   Therapy: We recommend that patient participate in individual therapy to address mental health concerns.  Medications: The Patient is to contact a medical professional and/or outpatient provider to address any new side effects that develop. The patient should update outpatient providers of any new medications and/or medication changes.   Safety:  The patient should abstain from use of illicit substances/drugs and abuse of any medications. If symptoms worsen or do not continue to improve or if the patient becomes actively suicidal or homicidal then it is recommended that the patient return to the closest hospital emergency department, the  [facility], or call 911 for further evaluation and treatment. National Suicide Prevention Lifeline 1-800-SUICIDE or (534) 461-2219.  About 988 988 offers  24/7 access to trained crisis counselors who can help people experiencing mental health-related distress. People can call or text 988 or chat 988lifeline.org for themselves or if they are worried about a loved one who may need crisis support.     Disposition: No evidence of imminent risk to self or others at present.   Patient does not meet criteria for psychiatric inpatient admission. Supportive therapy provided about ongoing stressors. Discussed crisis plan, support from social network, calling 911, coming to the Emergency Department, and calling Suicide Hotline.  This service was provided via telemedicine using a 2-way, interactive audio and video technology.  Names of all persons participating in this telemedicine service and their role in this encounter. Name: Octaviano Batty  Role: Patient   Name: Darrol [place]  Role: NP  Name: Role:   Name:  Role:     Marissa Calamity, NP 08/14/2022 12:40 PM

## 2022-08-14 NOTE — Inpatient Diabetes Management (Signed)
Inpatient Diabetes Program Recommendations  AACE/ADA: New Consensus Statement on Inpatient Glycemic Control (2015)  Target Ranges:  Prepandial:   less than 140 mg/dL      Peak postprandial:   less than 180 mg/dL (1-2 hours)      Critically ill patients:  140 - 180 mg/dL   Lab Results  Component Value Date   GLUCAP 311 (H) 08/14/2022   HGBA1C 7.6 (H) 08/11/2022    Review of Glycemic Control  Latest Reference Range & Units 08/13/22 07:41 08/13/22 12:27 08/13/22 18:25 08/13/22 21:45 08/14/22 08:43  Glucose-Capillary 70 - 99 mg/dL 255 (H) 257 (H) 318 (H) 265 (H) 311 (H)   Diabetes history: DM 2 Outpatient Diabetes medications: Lantus 24 units Daily Current orders for Inpatient glycemic control:  Semglee 30 units Novolog 0-9 units tid + hs  Inpatient Diabetes Program Recommendations:    Glucose 431.  A1c 7.6% on 9/18  Consider the following: -  Increase Semglee 35 units -  Increase Correction scale to Novolog 0-15 units tid   Thanks,  Tama Headings RN, MSN, BC-ADM Inpatient Diabetes Coordinator Team Pager [phone] (8a-5p)

## 2022-08-14 NOTE — ED Notes (Signed)
Eating, calling friend for ride, denies questions or needs

## 2022-08-14 NOTE — ED Notes (Signed)
Pt eating lunch. Beginning d/c process.

## 2022-08-20 ENCOUNTER — Telehealth: Payer: Self-pay

## 2022-08-20 NOTE — Patient Outreach (Signed)
  Care Coordination   08/20/2022 Name: Jose [name] MRN: [id number] DOB: 02/10/1961   Care Coordination Outreach Attempts:  An unsuccessful telephone outreach was attempted today to offer the patient information about available care coordination services as a benefit of their health plan.   Follow Up Plan:  Additional outreach attempts will be made to offer the patient care coordination information and services.   Encounter Outcome:  No Answer  Care Coordination Interventions Activated:  No   Care Coordination Interventions:  No, not indicated    [facility] [phone]

## 2022-09-23 ENCOUNTER — Emergency Department (HOSPITAL_COMMUNITY): Payer: Medicare HMO

## 2022-09-23 ENCOUNTER — Encounter (HOSPITAL_COMMUNITY): Payer: Self-pay

## 2022-09-23 ENCOUNTER — Emergency Department (HOSPITAL_COMMUNITY)
Admission: EM | Admit: 2022-09-23 | Discharge: 2022-09-24 | Discharge status: Against Medical Advice | Payer: Medicare HMO | Attending: Student | Admitting: Student

## 2022-09-23 DIAGNOSIS — I10 Essential (primary) hypertension: Secondary | ICD-10-CM | POA: Diagnosis not present

## 2022-09-23 DIAGNOSIS — R0602 Shortness of breath: Secondary | ICD-10-CM | POA: Diagnosis not present

## 2022-09-23 DIAGNOSIS — R079 Chest pain, unspecified: Secondary | ICD-10-CM | POA: Diagnosis not present

## 2022-09-23 DIAGNOSIS — Z5321 Procedure and treatment not carried out due to patient leaving prior to being seen by health care provider: Secondary | ICD-10-CM | POA: Diagnosis not present

## 2022-09-23 DIAGNOSIS — J449 Chronic obstructive pulmonary disease, unspecified: Secondary | ICD-10-CM | POA: Diagnosis not present

## 2022-09-23 DIAGNOSIS — R0789 Other chest pain: Secondary | ICD-10-CM | POA: Diagnosis not present

## 2022-09-23 DIAGNOSIS — I2510 Atherosclerotic heart disease of native coronary artery without angina pectoris: Secondary | ICD-10-CM | POA: Diagnosis not present

## 2022-09-23 LAB — CBC
HCT: 37.5 % — ABNORMAL LOW (ref 39.0–52.0)
Hemoglobin: 13.6 g/dL (ref 13.0–17.0)
MCH: 27.8 pg (ref 26.0–34.0)
MCHC: 36.3 g/dL — ABNORMAL HIGH (ref 30.0–36.0)
MCV: 76.5 fL — ABNORMAL LOW (ref 80.0–100.0)
Platelets: 204 10*3/uL (ref 150–400)
RBC: 4.9 MIL/uL (ref 4.22–5.81)
RDW: 13.7 % (ref 11.5–15.5)
WBC: 7.2 10*3/uL (ref 4.0–10.5)
nRBC: 0 % (ref 0.0–0.2)

## 2022-09-23 LAB — BASIC METABOLIC PANEL
Anion gap: 12 (ref 5–15)
BUN: 20 mg/dL (ref 8–23)
CO2: 18 mmol/L — ABNORMAL LOW (ref 22–32)
Calcium: 10 mg/dL (ref 8.9–10.3)
Chloride: 100 mmol/L (ref 98–111)
Creatinine, Ser: 1.86 mg/dL — ABNORMAL HIGH (ref 0.61–1.24)
GFR, Estimated: 40 mL/min — ABNORMAL LOW (ref >60)
Glucose, Bld: 199 mg/dL — ABNORMAL HIGH (ref 70–99)
Potassium: 4.3 mmol/L (ref 3.5–5.1)
Sodium: 130 mmol/L — ABNORMAL LOW (ref 135–145)

## 2022-09-23 LAB — TROPONIN I (HIGH SENSITIVITY): Troponin I (High Sensitivity): 21 ng/L — ABNORMAL HIGH (ref <18)

## 2022-09-23 MED ORDER — ASPIRIN 81 MG PO CHEW: 324.0000 mg | CHEWABLE_TABLET | Freq: Once | ORAL | Status: DC

## 2022-09-23 NOTE — ED Triage Notes (Signed)
Pt states left sided CP x 3 days that comes and goes. Pt describes as sharp. Pt states the pain causes SHOB.

## 2022-09-23 NOTE — ED Notes (Signed)
Registration informed this EMT that this patient asked for them to call him a cab and he left without being seen.

## 2022-09-23 NOTE — ED Notes (Signed)
Pt called for repeat trop, no response. Presumed left.

## 2022-09-23 NOTE — ED Provider Triage Note (Signed)
Emergency Medicine Provider Triage Evaluation Note  [facility] Jose [name] , Jose 61 y.o. [name]  was evaluated in triage.  Pt complains of CP x 3 days. Also SOB. Comes and goes, denies exertional component. .  Review of Systems  Per HPI  Physical Exam  BP (!) 152/79   Pulse 65   Temp 97.8 F (36.6 C) (Oral)   Resp 16   SpO2 99%  Gen:   Awake, no distress   Resp:  Normal effort  MSK:   Moves extremities without difficulty  Other:   Medical Decision Making  Medically screening exam initiated at 11:38 AM.  Appropriate orders placed.  Jose [name] was informed that the remainder of the evaluation will be completed by another provider, this initial triage assessment does not replace that evaluation, and the importance of remaining in the ED until their evaluation is complete.     Sherrill Raring, PA-C 09/23/22 1139

## 2022-10-03 ENCOUNTER — Telehealth: Payer: Self-pay | Admitting: *Deleted

## 2022-10-03 NOTE — Telephone Encounter (Signed)
     Patient  visit on 09/24/2022  at [facility]  was for chest pain   Have you been able to follow up with your primary care physician?Patient is feeling better , and is needing to reach out to cardiologist so provided contact information on [facility] where he was seen   The patient was or was not able to obtain any needed medicine or equipment.  Are there diet recommendations that you are having difficulty following?  Patient expresses understanding of discharge instructions and education provided has no other needs at this time.    Yehuda Mao Greenauer -[facility] [place], Population Health [phone] [address] [place] , [place] [address] Email : [email or web address]

## 2022-10-07 DIAGNOSIS — R6889 Other general symptoms and signs: Secondary | ICD-10-CM | POA: Diagnosis not present

## 2022-10-09 NOTE — Progress Notes (Signed)
Office Visit    Patient Name: Jose [name] Date of Encounter: 10/10/2022  PCP:  Clinic, [facility]  Cardiologist:  Kristeen Miss, MD  Advanced Practice Provider:  No care team member to display Electrophysiologist:  None   HPI    Jose [name] is a 61 y.o. male with a past medical history significant for alcohol abuse, CAD, CKD, cocaine abuse, hypertension, hyperlipidemia, diabetes mellitus presents today for overdue office visit.  He had been seen initially when he was admitted to the hospital for an episode of chest pain.  He had a very low troponin level.  Noted to be hypertensive.  UDS was positive for cocaine.  He cut his alcohol intake.  Was admitted with hyponatremia (he was drinking lots of beer prior to admission).  He was seen October 2020 for follow-up.  He had not been getting much exercise at that time.  Echocardiogram June 2020 showed normal LV function, grade 1 DD.  Blood pressure was elevated on exam.  He was trying to avoid salt for the most part.  He was then seen in January 2022 and by that point his chest pain was totally resolved and he had not had any further episodes.  Last follow-up appointment was April 2022 following a recent admission to [facility] for chest pain.  Ruled out for MI.  Started on Imdur which she did not tolerate.  He was taken off Imdur and restarted on amlodipine.  Today, he tells me that he has not had any further chest pain or shortness of breath.  He has been going to the gym without any issues.  He walks on the track and rides a stationary bike.  He is concerned about his Imdur medication.  He states that he has had issues with intimacy since starting this medication.  He was taking Cialis but he was told to stop this since he was recommended blood pressure is well controlled today and he has not had any further symptoms but I told him that I would discuss this with Dr. Elease Hashimoto.  He also asked  for prescription for a humidifier which I provided today.    Reports no shortness of breath nor dyspnea on exertion. Reports no chest pain, pressure, or tightness. No edema, orthopnea, PND. Reports no palpitations.   Past Medical History    Past Medical History:  Diagnosis Date   Alcohol abuse    CAD (coronary artery disease)    a. Reported MI in 2012 s/p 2 stents;  b. 08/2012 Cath: LM 20, LAD 20 diff ISR, jailed septal - 99%, LCX 30ost, RI large, min irregs, RCA 30p, 20-30 ISR-->Med Rx; c. 2017 Pt reports Neg stress test @ [facility].   CKD (chronic kidney disease), stage III (HCC)    Cocaine abuse (HCC)    COPD (chronic obstructive pulmonary disease) (HCC)    Depression    Diabetes mellitus without complication (HCC)    Hyperlipidemia    Hypertension    Hypertensive urgency 02/11/2021   Noncompliance    Past Surgical History:  Procedure Laterality Date   CARDIAC CATHETERIZATION     CORONARY STENT PLACEMENT     LEFT HEART CATH AND CORONARY ANGIOGRAPHY N/A 08/22/2020   Procedure: LEFT HEART CATH AND CORONARY ANGIOGRAPHY;  Surgeon: Lyn Records, MD;  Location: [facility] INVASIVE CV LAB;  Service: Cardiovascular;  Laterality: N/A;   LEFT HEART CATH AND CORONARY ANGIOGRAPHY N/A 01/28/2022   Procedure: LEFT HEART  CATH AND CORONARY ANGIOGRAPHY;  Surgeon: Lennette Bihari, MD;  Location: [facility] INVASIVE CV LAB;  Service: Cardiovascular;  Laterality: N/A;   LEFT HEART CATHETERIZATION WITH CORONARY ANGIOGRAM Bilateral 08/25/2012   Procedure: LEFT HEART CATHETERIZATION WITH CORONARY ANGIOGRAM;  Surgeon: Kathleene Hazel, MD;  Location: [facility] CATH LAB;  Service: Cardiovascular;  Laterality: Bilateral;    Allergies  Allergies  Allergen Reactions   Lisinopril-Hydrochlorothiazide Swelling    Causes swelling of lips   Simvastatin Other (See Comments)    ALT (SGPT) level raised, Aspartate aminotransferase serum level raised     EKGs/Labs/Other Studies Reviewed:   The following studies were reviewed  today:  Cardiac catheterization 01/28/2022   Left Main  Mid LM to Dist LM lesion is 50% stenosed.    Left Anterior Descending  Ost LAD to Prox LAD lesion is 50% stenosed.  Prox LAD to Mid LAD lesion is 30% stenosed. The lesion was previously treated .    First Diagonal Branch  Vessel is small in size.    Left Circumflex  Ost Cx lesion is 75% stenosed.    First Obtuse Marginal Branch  Vessel is small in size.    Third Obtuse Marginal Branch  Vessel is small in size.    Right Coronary Artery  Mid RCA lesion is 25% stenosed.  Dist RCA lesion is 90% stenosed. The lesion was previously treated .    Intervention   No interventions have been documented.   Left Heart  Left Ventricle LVEDP 13 mmHg   Coronary Diagrams  Diagnostic Dominance: Right  Intervention  EKG:  EKG is not ordered today.    Recent Labs: 04/22/2022: B Natriuretic Peptide 48.9 08/11/2022: ALT 25; Magnesium 1.3; TSH 2.797 09/23/2022: BUN 20; Creatinine, Ser 1.86; Hemoglobin 13.6; Platelets 204; Potassium 4.3; Sodium 130  Recent Lipid Panel    Component Value Date/Time   CHOL 145 08/11/2022 1213   CHOL 148 12/14/2020 0849   TRIG 174 (H) 08/11/2022 1213   HDL 61 08/11/2022 1213   HDL 54 12/14/2020 0849   CHOLHDL 2.4 08/11/2022 1213   VLDL 35 08/11/2022 1213   LDLCALC 49 08/11/2022 1213   LDLCALC 73 12/14/2020 0849    Home Medications   Current Meds  Medication Sig   albuterol (PROVENTIL HFA;VENTOLIN HFA) 108 (90 BASE) MCG/ACT inhaler Inhale 2 puffs into the lungs every 6 (six) hours as needed for wheezing or shortness of breath.    amLODipine (NORVASC) 10 MG tablet Take 1 tablet (10 mg total) by mouth daily.   aspirin EC 81 MG tablet Take 81 mg by mouth daily. Swallow whole.   atorvastatin (LIPITOR) 80 MG tablet Take 1 tablet (80 mg total) by mouth daily.   brimonidine (ALPHAGAN) 0.2 % ophthalmic solution Place 1 drop into both eyes 2 (two) times daily.    carvedilol (COREG) 6.25 MG tablet  Take 1 tablet (6.25 mg total) by mouth 2 (two) times daily with a meal.   clopidogrel (PLAVIX) 75 MG tablet Take 1 tablet (75 mg total) by mouth daily with breakfast.   doxazosin (CARDURA) 8 MG tablet Take 4 mg by mouth at bedtime.   fluticasone-salmeterol (ADVAIR) 500-50 MCG/ACT AEPB Inhale 1 puff into the lungs in the morning and at bedtime.   furosemide (LASIX) 20 MG tablet Take 1 tablet by mouth every other day.   glucose 4 GM chewable tablet Chew 1 tablet (4 g total) by mouth as needed for low blood sugar (Blood sugars less than 70).   hydrALAZINE (APRESOLINE)  100 MG tablet Take 1 tablet (100 mg total) by mouth 3 (three) times daily. (Patient taking differently: Take 100 mg by mouth 2 (two) times daily.)   hydrOXYzine (ATARAX) 25 MG tablet Take 1 tablet (25 mg total) by mouth 3 (three) times daily as needed for anxiety.   insulin glargine-yfgn (SEMGLEE) 100 UNIT/ML injection Inject 0.3 mLs (30 Units total) into the skin daily with breakfast.   isosorbide mononitrate (IMDUR) 30 MG 24 hr tablet Take 1 tablet (30 mg total) by mouth daily.   latanoprost (XALATAN) 0.005 % ophthalmic solution Place 1 drop into both eyes at bedtime.    magnesium oxide (MAG-OX) 400 (240 Mg) MG tablet Take 1 tablet (400 mg total) by mouth daily.   Multiple Vitamin (MULTIVITAMIN WITH MINERALS) TABS tablet Take 1 tablet by mouth daily.   nitroGLYCERIN (NITROSTAT) 0.4 MG SL tablet Place 1 tablet (0.4 mg total) under the tongue every 5 (five) minutes as needed for chest pain.   Omega-3 Fatty Acids (FISH OIL) 1000 MG CAPS Take 1,000 mg by mouth daily.   traZODone (DESYREL) 150 MG tablet Take 75 mg by mouth at bedtime.     Review of Systems      All other systems reviewed and are otherwise negative except as noted above.  Physical Exam    VS:  BP 130/82   Pulse 64   Ht 5\' 11"  (1.803 m)   Wt 185 lb (83.9 kg)   BMI 25.80 kg/m  , BMI Body mass index is 25.8 kg/m.  Wt Readings from Last 3 Encounters:  10/10/22 185  lb (83.9 kg)  09/23/22 183 lb (83 kg)  08/11/22 157 lb (71.2 kg)     GEN: Well nourished, well developed, in no acute distress. HEENT: normal. Neck: Supple, no JVD, carotid bruits, or masses. Cardiac: RRR, no murmurs, rubs, or gallops. No clubbing, cyanosis, edema.  Radials/PT 2+ and equal bilaterally.  Respiratory:  Respirations regular and unlabored, clear to auscultation bilaterally. GI: Soft, nontender, nondistended. MS: No deformity or atrophy. Skin: Warm and dry, no rash. Neuro:  Strength and sensation are intact. Psych: Normal affect.  Assessment & Plan    CAD status post prior PCI -no further chest pain or SOB -continue current medications including Norvasc 10 mg daily, aspirin 81 mg daily, Lipitor 80 mg daily, Coreg 6.25 mg twice daily, Plavix 70, Cardura 1 mg at bedtime, Lasix 20 mg daily, hydralazine 100 mg 3 times daily, mild Imdur 30 mg daily, nitro as needed sublingual tabs  Hypertension -blood pressure well controlled today -continue current medication regimen  History of cocaine abuse and alcohol abuse -two 12 ounce beers daily  -no more cocaine use  Hyperlipidemia -LDL 49  -Continue Lipitor 80 mg daily         Disposition: Follow up 6 months with 08/13/22, MD or APP.  Signed, Kristeen Miss, PA-C 10/10/2022, 10:31 AM [facility]

## 2022-10-10 ENCOUNTER — Emergency Department (HOSPITAL_COMMUNITY): Payer: Medicare HMO

## 2022-10-10 ENCOUNTER — Emergency Department (HOSPITAL_COMMUNITY)
Admission: EM | Admit: 2022-10-10 | Discharge: 2022-10-10 | Disposition: A | Discharge status: Home / Self Care | Payer: Medicare HMO | Attending: Emergency Medicine | Admitting: Emergency Medicine

## 2022-10-10 ENCOUNTER — Ambulatory Visit: Payer: Medicare HMO | Attending: Physician Assistant | Admitting: Physician Assistant

## 2022-10-10 ENCOUNTER — Encounter: Payer: Self-pay | Admitting: Physician Assistant

## 2022-10-10 VITALS — BP 130/82 | HR 64 | Ht 71.0 in | Wt 185.0 lb

## 2022-10-10 DIAGNOSIS — Z79899 Other long term (current) drug therapy: Secondary | ICD-10-CM | POA: Diagnosis not present

## 2022-10-10 DIAGNOSIS — I2510 Atherosclerotic heart disease of native coronary artery without angina pectoris: Secondary | ICD-10-CM | POA: Diagnosis not present

## 2022-10-10 DIAGNOSIS — Z20822 Contact with and (suspected) exposure to covid-19: Secondary | ICD-10-CM | POA: Insufficient documentation

## 2022-10-10 DIAGNOSIS — Z7982 Long term (current) use of aspirin: Secondary | ICD-10-CM | POA: Insufficient documentation

## 2022-10-10 DIAGNOSIS — R6889 Other general symptoms and signs: Secondary | ICD-10-CM | POA: Diagnosis not present

## 2022-10-10 DIAGNOSIS — I4430 Unspecified atrioventricular block: Secondary | ICD-10-CM | POA: Diagnosis not present

## 2022-10-10 DIAGNOSIS — F17210 Nicotine dependence, cigarettes, uncomplicated: Secondary | ICD-10-CM | POA: Diagnosis not present

## 2022-10-10 DIAGNOSIS — R1900 Intra-abdominal and pelvic swelling, mass and lump, unspecified site: Secondary | ICD-10-CM | POA: Diagnosis not present

## 2022-10-10 DIAGNOSIS — I129 Hypertensive chronic kidney disease with stage 1 through stage 4 chronic kidney disease, or unspecified chronic kidney disease: Secondary | ICD-10-CM | POA: Insufficient documentation

## 2022-10-10 DIAGNOSIS — R059 Cough, unspecified: Secondary | ICD-10-CM | POA: Insufficient documentation

## 2022-10-10 DIAGNOSIS — E119 Type 2 diabetes mellitus without complications: Secondary | ICD-10-CM | POA: Diagnosis not present

## 2022-10-10 DIAGNOSIS — F1010 Alcohol abuse, uncomplicated: Secondary | ICD-10-CM

## 2022-10-10 DIAGNOSIS — I959 Hypotension, unspecified: Secondary | ICD-10-CM | POA: Diagnosis not present

## 2022-10-10 DIAGNOSIS — N189 Chronic kidney disease, unspecified: Secondary | ICD-10-CM | POA: Insufficient documentation

## 2022-10-10 DIAGNOSIS — R000 Tachycardia, unspecified: Secondary | ICD-10-CM | POA: Diagnosis not present

## 2022-10-10 DIAGNOSIS — I10 Essential (primary) hypertension: Secondary | ICD-10-CM

## 2022-10-10 DIAGNOSIS — F1410 Cocaine abuse, uncomplicated: Secondary | ICD-10-CM | POA: Diagnosis not present

## 2022-10-10 DIAGNOSIS — R0602 Shortness of breath: Secondary | ICD-10-CM | POA: Diagnosis not present

## 2022-10-10 DIAGNOSIS — Z794 Long term (current) use of insulin: Secondary | ICD-10-CM | POA: Diagnosis not present

## 2022-10-10 LAB — COMPREHENSIVE METABOLIC PANEL
ALT: 22 U/L (ref 0–44)
AST: 25 U/L (ref 15–41)
Albumin: 3.6 g/dL (ref 3.5–5.0)
Alkaline Phosphatase: 70 U/L (ref 38–126)
Anion gap: 13 (ref 5–15)
BUN: 23 mg/dL (ref 8–23)
CO2: 20 mmol/L — ABNORMAL LOW (ref 22–32)
Calcium: 9.9 mg/dL (ref 8.9–10.3)
Chloride: 99 mmol/L (ref 98–111)
Creatinine, Ser: 1.85 mg/dL — ABNORMAL HIGH (ref 0.61–1.24)
GFR, Estimated: 40 mL/min — ABNORMAL LOW (ref >60)
Glucose, Bld: 226 mg/dL — ABNORMAL HIGH (ref 70–99)
Potassium: 3.5 mmol/L (ref 3.5–5.1)
Sodium: 132 mmol/L — ABNORMAL LOW (ref 135–145)
Total Bilirubin: 0.4 mg/dL (ref 0.3–1.2)
Total Protein: 6.3 g/dL — ABNORMAL LOW (ref 6.5–8.1)

## 2022-10-10 LAB — CBC WITH DIFFERENTIAL/PLATELET
Abs Immature Granulocytes: 0.01 10*3/uL (ref 0.00–0.07)
Basophils Absolute: 0.1 10*3/uL (ref 0.0–0.1)
Basophils Relative: 1 %
Eosinophils Absolute: 1.1 10*3/uL — ABNORMAL HIGH (ref 0.0–0.5)
Eosinophils Relative: 17 %
HCT: 36.2 % — ABNORMAL LOW (ref 39.0–52.0)
Hemoglobin: 13.1 g/dL (ref 13.0–17.0)
Immature Granulocytes: 0 %
Lymphocytes Relative: 34 %
Lymphs Abs: 2.3 10*3/uL (ref 0.7–4.0)
MCH: 27.8 pg (ref 26.0–34.0)
MCHC: 36.2 g/dL — ABNORMAL HIGH (ref 30.0–36.0)
MCV: 76.9 fL — ABNORMAL LOW (ref 80.0–100.0)
Monocytes Absolute: 0.7 10*3/uL (ref 0.1–1.0)
Monocytes Relative: 11 %
Neutro Abs: 2.5 10*3/uL (ref 1.7–7.7)
Neutrophils Relative %: 37 %
Platelets: 190 10*3/uL (ref 150–400)
RBC: 4.71 MIL/uL (ref 4.22–5.81)
RDW: 14 % (ref 11.5–15.5)
WBC: 6.7 10*3/uL (ref 4.0–10.5)
nRBC: 0 % (ref 0.0–0.2)

## 2022-10-10 LAB — TROPONIN I (HIGH SENSITIVITY): Troponin I (High Sensitivity): 20 ng/L — ABNORMAL HIGH (ref <18)

## 2022-10-10 LAB — RESP PANEL BY RT-PCR (FLU A&B, COVID) ARPGX2
Influenza A by PCR: NEGATIVE
Influenza B by PCR: NEGATIVE
SARS Coronavirus 2 by RT PCR: NEGATIVE

## 2022-10-10 LAB — BRAIN NATRIURETIC PEPTIDE: B Natriuretic Peptide: 117.7 pg/mL — ABNORMAL HIGH (ref 0.0–100.0)

## 2022-10-10 NOTE — ED Provider Notes (Signed)
[facility]ENC[facility] Provider Note   CSN: [id number] Arrival date & time: 10/10/22  2000     History  Chief Complaint  Patient presents with   Shortness of Breath    Jose [name] is a 61 year old male with a history of alcohol abuse, CAD, CKD, prior cocaine abuse, hypertension, hyperlipidemia, diabetes mellitus who presents to the emergency department for shortness of breath.  The patient states that his shortness of breath has been ongoing for the last 5 days.  He states that he has not been able to eat or drink well this whole week because of the shortness of breath.  He went to his cardiologist today, who told him he had an irregular heartbeat which is new for him.  He states that his shortness of breath got even worse at night this evening, he tried a breathing treatment at home which did not help, so he came here.  He also noted a cough that started tonight.  He denies any fevers, history of DVT/PE, hemoptysis, recent surgery, or leg swelling.  He has noted some swelling in his abdomen but no abdominal pain, does not believe he has gained weight recently.  He is not sure if maybe he got COVID or some other infection.  Patient endorses recent cigarette smoking and marijuana use, but denies recent cocaine use.  The history is provided by the patient and the EMS personnel.       Home Medications Prior to Admission medications   Medication Sig Start Date End Date Taking? Authorizing Provider  albuterol (PROVENTIL HFA;VENTOLIN HFA) 108 (90 BASE) MCG/ACT inhaler Inhale 2 puffs into the lungs every 6 (six) hours as needed for wheezing or shortness of breath.     [provider]  amLODipine (NORVASC) 10 MG tablet Take 1 tablet (10 mg total) by mouth daily. 03/12/21   [name], Deloris PingPhilip J, MD  aspirin EC 81 MG tablet Take 81 mg by mouth daily. Swallow whole.    [provider]  atorvastatin (LIPITOR) 80 MG tablet Take 1 tablet (80 mg total) by  mouth daily. 01/30/22   Marjie SkiffGoodrich, Callie E, PA-C  brimonidine (ALPHAGAN) 0.2 % ophthalmic solution Place 1 drop into both eyes 2 (two) times daily.  04/11/15   [provider]  carvedilol (COREG) 6.25 MG tablet Take 1 tablet (6.25 mg total) by mouth 2 (two) times daily with a meal. 01/29/22   Corrin ParkerGoodrich, Callie E, PA-C  clopidogrel (PLAVIX) 75 MG tablet Take 1 tablet (75 mg total) by mouth daily with breakfast. 01/30/22   Marjie SkiffGoodrich, Callie E, PA-C  divalproex (DEPAKOTE ER) 250 MG 24 hr tablet Take 250 mg by mouth at bedtime. Patient not taking: Reported on 10/10/2022 08/07/22   [provider]  doxazosin (CARDURA) 8 MG tablet Take 4 mg by mouth at bedtime. 04/15/20   [provider]  fluticasone-salmeterol (ADVAIR) 500-50 MCG/ACT AEPB Inhale 1 puff into the lungs in the morning and at bedtime. 03/15/22   [provider]  furosemide (LASIX) 20 MG tablet Take 1 tablet by mouth every other day. 09/21/20   [provider]  glucose 4 GM chewable tablet Chew 1 tablet (4 g total) by mouth as needed for low blood sugar (Blood sugars less than 70). 08/11/22   Bing NeighborsHarris, Kimberly S, FNP  hydrALAZINE (APRESOLINE) 100 MG tablet Take 1 tablet (100 mg total) by mouth 3 (three) times daily. Patient taking differently: Take 100 mg by mouth 2 (two) times daily. 03/12/21  [name], Deloris Ping, MD  hydrOXYzine (ATARAX) 25 MG tablet Take 1 tablet (25 mg total) by mouth 3 (three) times daily as needed for anxiety. 08/11/22   Bing Neighbors, FNP  insulin glargine-yfgn (SEMGLEE) 100 UNIT/ML injection Inject 0.3 mLs (30 Units total) into the skin daily with breakfast. 08/12/22   Bing Neighbors, FNP  isosorbide mononitrate (IMDUR) 30 MG 24 hr tablet Take 1 tablet (30 mg total) by mouth daily. 01/30/22   Marjie Skiff E, PA-C  latanoprost (XALATAN) 0.005 % ophthalmic solution Place 1 drop into both eyes at bedtime.  04/11/15   [provider]  magnesium oxide (MAG-OX) 400 (240 Mg) MG  tablet Take 1 tablet (400 mg total) by mouth daily. 08/12/22   Bing Neighbors, FNP  Multiple Vitamin (MULTIVITAMIN WITH MINERALS) TABS tablet Take 1 tablet by mouth daily.    [provider]  nitroGLYCERIN (NITROSTAT) 0.4 MG SL tablet Place 1 tablet (0.4 mg total) under the tongue every 5 (five) minutes as needed for chest pain. 01/29/22   Corrin Parker, PA-C  Omega-3 Fatty Acids (FISH OIL) 1000 MG CAPS Take 1,000 mg by mouth daily.    [provider]  traZODone (DESYREL) 150 MG tablet Take 75 mg by mouth at bedtime.    [provider]      Allergies    Lisinopril-hydrochlorothiazide and Simvastatin    Review of Systems   Review of Systems   See HPI.   Physical Exam Updated Vital Signs BP (!) 161/81   Pulse 74   Temp 97.9 F (36.6 C) (Oral)   Resp (!) 21   SpO2 98%   Physical Exam Vitals and nursing note reviewed.  Constitutional:      Appearance: He is not toxic-appearing or diaphoretic.  HENT:     Head: Normocephalic and atraumatic.  Eyes:     Extraocular Movements: Extraocular movements intact.  Cardiovascular:     Rate and Rhythm: Normal rate.  Pulmonary:     Effort: Tachypnea present.     Breath sounds: No decreased breath sounds, wheezing, rhonchi or rales.  Abdominal:     Palpations: Abdomen is soft.     Tenderness: There is no abdominal tenderness.  Musculoskeletal:     Cervical back: Normal range of motion.     Right lower leg: No tenderness. Edema present.     Left lower leg: No tenderness. Edema present.     Comments: 1+ bilateral lower extremity edema to the knee.  Skin:    General: Skin is warm and dry.  Neurological:     General: No focal deficit present.     Mental Status: He is alert and oriented to person, place, and time.     Motor: No weakness.     ED Results / Procedures / Treatments   Labs (all labs ordered are listed, but only abnormal results are displayed) Labs Reviewed  CBC WITH DIFFERENTIAL/PLATELET -  Abnormal; Notable for the following components:      Result Value   HCT 36.2 (*)    MCV 76.9 (*)    MCHC 36.2 (*)    Eosinophils Absolute 1.1 (*)    All other components within normal limits  COMPREHENSIVE METABOLIC PANEL - Abnormal; Notable for the following components:   Sodium 132 (*)    CO2 20 (*)    Glucose, Bld 226 (*)    Creatinine, Ser 1.85 (*)    Total Protein 6.3 (*)    GFR, Estimated 40 (*)  All other components within normal limits  BRAIN NATRIURETIC PEPTIDE - Abnormal; Notable for the following components:   B Natriuretic Peptide 117.7 (*)    All other components within normal limits  TROPONIN I (HIGH SENSITIVITY) - Abnormal; Notable for the following components:   Troponin I (High Sensitivity) 20 (*)    All other components within normal limits  RESP PANEL BY RT-PCR (FLU A&B, COVID) [id number]    EKG EKG Interpretation  Date/Time:  Friday October 10 2022 20:01:12 EST Ventricular Rate:  76 PR Interval:  203 QRS Duration: 96 QT Interval:  384 QTC Calculation: 432 R Axis:   74 Text Interpretation: Sinus rhythm Ventricular premature complex Anteroseptal infarct, old Confirmed by Alvester Chou [id number] on 10/10/2022 8:57:37 PM  Radiology DG Chest Portable 1 View  Result Date: 10/10/2022 CLINICAL DATA:  Shortness of breath EXAM: PORTABLE CHEST 1 VIEW COMPARISON:  Chest x-ray dated October 31st 2023 FINDINGS: The heart size and mediastinal contours are within normal limits. Both lungs are clear. The visualized skeletal structures are unremarkable. IMPRESSION: No active disease. Electronically Signed   By: Allegra Lai M.D.   On: 10/10/2022 20:33    Procedures Procedures    Medications Ordered in ED Medications - No data to display  ED Course/ Medical Decision Making/ A&P                           Medical Decision Making Problems Addressed: Shortness of breath: acute illness or injury that poses a threat to life or bodily functions  Amount and/or  Complexity of Data Reviewed External Data Reviewed: labs and notes. Labs: ordered. Decision-making details documented in ED Course. Radiology: ordered and independent interpretation performed. Decision-making details documented in ED Course. ECG/medicine tests: ordered and independent interpretation performed. Decision-making details documented in ED Course.   Jose [name] is a 61 year old male with a history of alcohol abuse, CAD, CKD, prior cocaine abuse, hypertension, hyperlipidemia, diabetes mellitus who presents to the emergency department for shortness of breath.  On initial exam, patient does appear mildly short of breath but overall well-appearing otherwise.  He is satting 97 to 98% on room air.  On chart review, patient was seen by cardiology PA in clinic today.  He denied any chest pain or shortness of breath during that visit.  There is no mention of an irregular heart rate.  Patient had cardiac catheterization on 01/28/2022, with 90% stenosis of distal RCA, 75% stenosis of ost circumflex, and 50% stenosis of ost LAD to proximal LAD, mid left main to distal left main.   Initial Differential Diagnoses: Initial concern highest for possible CHF or pneumonia. No wheezing or decreased air movement on exam to suggest COPD exacerbation. Patient does have history of CAD, will obtain EKG and troponin to evaluate for possible ACS.  Feel pulmonary embolism unlikely as patient has no history of malignancy, recent surgery, prior DVT/PE, hemoptysis, and no clinical signs of DVT on exam.  He also has no hypoxia or tachycardia to raise my suspicion for this. No swelling or wheezing to suggest anaphylaxis.   Workup: EKG on my independent review demonstrates a sinus rhythm, 76 bpm.  No ST elevations or depressions to suggest acute ischemia.  Single PVC noted.  No significant arrhythmia or high-grade conduction block, slightly prolonged PR interval.  Overall reassuring ECG.  Chest x-ray on my  independent review as well as review by radiology demonstrates no widened mediastinum, enlarged cardiac silhouette, pleural effusion, or  significant pulmonary edema.  Additionally, no focal consolidation to suggest pneumonia, no pneumothorax.  CBC unremarkable, no leukocytosis or anemia.  CMP with creatinine 1.85, which does appear around baseline and does not represent AKI today.  Troponin 20, however baseline is between 21 and 33 on priors, therefore do not feel this is representative of acute MI today.  Additionally, patient without chest pain at this time and EKG does not demonstrate signs of acute ischemia.  BNP mildly elevated at 117, which is about double from the patient's most recent prior.  On reassessment, patient remains hemodynamically stable and overall well-appearing.  He has had no episodes of hypoxia in the emergency department on room air.  Overall feel that symptoms may be secondary to small amount of fluid overload given mild lower extremity edema and elevation in BNP from baseline.  Patient states he is currently taking his Lasix every other day.  Patient instructed to start taking his Lasix once a day, and to follow-up with his cardiologist as soon as possible for repeat examination and further discussion of his medications.  Patient voiced understanding of and agreement with this plan, was discharged in stable condition with return precautions.         Final Clinical Impression(s) / ED Diagnoses Final diagnoses:  Shortness of breath    Rx / DC Orders ED Discharge Orders     None         Stephanie Coup, MD 10/10/22 2251    Terald Sleeper, MD 10/10/22 2325

## 2022-10-10 NOTE — ED Notes (Signed)
Radiology at bedside

## 2022-10-10 NOTE — ED Triage Notes (Signed)
BIB [facility] from home. C/o SOB x 5 days seen cardio today and didn't mention SOB. Was advised that he had an irregular heart rate and the medications currently on would treat it. Went home SOB became worse. EMS found on monitor bigeminy and trigeminy. Denies any CP or N/V. Lung sounds clear and equal bilat but still c/o SOB.

## 2022-10-10 NOTE — Patient Instructions (Signed)
Medication Instructions:  Your physician recommends that you continue on your current medications as directed. Please refer to the Current Medication list given to you today.  *If you need a refill on your cardiac medications before your next appointment, please call your pharmacy*   Lab Work: None If you have labs (blood work) drawn today and your tests are completely normal, you will receive your results only by: MyChart Message (if you have MyChart) OR A paper copy in the mail If you have any lab test that is abnormal or we need to change your treatment, we will call you to review the results.   Follow-Up: At [facility], you and your health needs are our priority.  As part of our continuing mission to provide you with exceptional heart care, we have created designated Provider Care Teams.  These Care Teams include your primary Cardiologist (physician) and Advanced Practice Providers (APPs -  Physician Assistants and Nurse Practitioners) who all work together to provide you with the care you need, when you need it.  We recommend signing up for the patient portal called "MyChart".  Sign up information is provided on this After Visit Summary.  MyChart is used to connect with patients for Virtual Visits (Telemedicine).  Patients are able to view lab/test results, encounter notes, upcoming appointments, etc.  Non-urgent messages can be sent to your provider as well.   To learn more about what you can do with MyChart, go to [email or web address].    Your next appointment:   6 month(s)  The format for your next appointment:   In Person  Provider:   Kristeen Miss, MD    Important Information About Sugar

## 2022-10-10 NOTE — Discharge Instructions (Addendum)
You were seen in the emergency department for shortness of breath.  While you are here, we performed a physical exam, checked an EKG and chest x-ray, and got labs.  These were overall reassuring, showing no emergency cause of your symptoms today.  Your work-up today did suggest that you may have a little bit of fluid buildup in your body from your heart not pumping fully effectively.   Please start taking your Lasix (the water pill) once every day instead of every other day.  Please make an appointment to see your primary doctor or cardiologist as soon as possible to discuss medication changes in your symptoms.  Please return to the emergency department if you have new or worsening chest pain, difficulty breathing, passing out, fevers along with shortness of breath, or any other reason to think you need emergency care.  We hope you feel better soon.

## 2022-10-13 NOTE — Progress Notes (Signed)
LMOM for patient to return call.

## 2022-10-21 ENCOUNTER — Other Ambulatory Visit: Payer: Self-pay | Admitting: *Deleted

## 2022-10-21 ENCOUNTER — Telehealth: Payer: Self-pay | Admitting: Cardiovascular Disease

## 2022-10-21 MED ORDER — TADALAFIL 10 MG PO TABS: 10.0000 mg | ORAL_TABLET | Freq: Every day | ORAL | 0 refills | Status: DC | PRN

## 2022-10-21 NOTE — Progress Notes (Signed)
Spoke with patient and made him aware to stop taking the imdur per Dr Billy Fischer. Also informed him that per Dr Billy Fischer he can try cialis 10 mg as needed for erectile dysfunction. He requested that this be sent to the [facility]. Rx sent as requested.

## 2022-10-21 NOTE — Telephone Encounter (Signed)
Pt states that at last visit he was given a prescription for a Humidifier. He says that [facility] that [facility]. Pt would like a callback regarding this matter. Please advise

## 2022-10-22 NOTE — Telephone Encounter (Signed)
Reached out to patient and got voicemail. Left detailed message informing him that we do not write for DME equipment, including humidifiers. He should call his PCP (which is listed as the [place]) and ask them to write it for him. If they will not at the [place] and he doesn't have another PCP, I suggest he go to urgent care.

## 2023-03-05 DIAGNOSIS — L03031 Cellulitis of right toe: Secondary | ICD-10-CM | POA: Diagnosis not present

## 2023-03-05 DIAGNOSIS — E1140 Type 2 diabetes mellitus with diabetic neuropathy, unspecified: Secondary | ICD-10-CM | POA: Diagnosis not present

## 2023-03-31 ENCOUNTER — Other Ambulatory Visit (HOSPITAL_COMMUNITY): Payer: Self-pay | Admitting: Internal Medicine

## 2023-03-31 DIAGNOSIS — D7389 Other diseases of spleen: Secondary | ICD-10-CM

## 2023-04-06 ENCOUNTER — Ambulatory Visit (HOSPITAL_COMMUNITY)
Admission: RE | Admit: 2023-04-06 | Discharge: 2023-04-06 | Disposition: A | Discharge status: Home / Self Care | Payer: No Typology Code available for payment source | Source: Ambulatory Visit | Attending: Internal Medicine | Admitting: Internal Medicine

## 2023-04-06 DIAGNOSIS — D7389 Other diseases of spleen: Secondary | ICD-10-CM

## 2023-04-07 ENCOUNTER — Ambulatory Visit (HOSPITAL_COMMUNITY)
Admission: RE | Admit: 2023-04-07 | Discharge: 2023-04-07 | Disposition: A | Discharge status: Home / Self Care | Payer: No Typology Code available for payment source | Source: Ambulatory Visit | Attending: Internal Medicine | Admitting: Internal Medicine

## 2023-04-07 DIAGNOSIS — D7389 Other diseases of spleen: Secondary | ICD-10-CM | POA: Diagnosis present

## 2023-04-09 ENCOUNTER — Ambulatory Visit: Payer: No Typology Code available for payment source | Admitting: Cardiovascular Disease

## 2023-04-24 ENCOUNTER — Emergency Department (HOSPITAL_COMMUNITY): Payer: No Typology Code available for payment source

## 2023-04-24 ENCOUNTER — Inpatient Hospital Stay (HOSPITAL_COMMUNITY)
Admission: EM | Admit: 2023-04-24 | Discharge: 2023-04-30 | DRG: 279 | Disposition: A | Discharge status: Home / Self Care | Payer: No Typology Code available for payment source | Attending: Internal Medicine | Admitting: Internal Medicine

## 2023-04-24 ENCOUNTER — Emergency Department (HOSPITAL_COMMUNITY)
Admit: 2023-04-24 | Discharge: 2023-04-24 | Disposition: A | Discharge status: Home / Self Care | Payer: No Typology Code available for payment source | Attending: Vascular Surgery

## 2023-04-24 ENCOUNTER — Other Ambulatory Visit: Payer: Self-pay

## 2023-04-24 ENCOUNTER — Encounter (HOSPITAL_COMMUNITY): Payer: Self-pay | Admitting: Internal Medicine

## 2023-04-24 DIAGNOSIS — I70221 Atherosclerosis of native arteries of extremities with rest pain, right leg: Principal | ICD-10-CM

## 2023-04-24 DIAGNOSIS — K5900 Constipation, unspecified: Secondary | ICD-10-CM | POA: Diagnosis not present

## 2023-04-24 DIAGNOSIS — E669 Obesity, unspecified: Secondary | ICD-10-CM | POA: Diagnosis present

## 2023-04-24 DIAGNOSIS — Z8616 Personal history of COVID-19: Secondary | ICD-10-CM | POA: Diagnosis not present

## 2023-04-24 DIAGNOSIS — N179 Acute kidney failure, unspecified: Secondary | ICD-10-CM | POA: Diagnosis not present

## 2023-04-24 DIAGNOSIS — S91301A Unspecified open wound, right foot, initial encounter: Secondary | ICD-10-CM | POA: Diagnosis not present

## 2023-04-24 DIAGNOSIS — E1165 Type 2 diabetes mellitus with hyperglycemia: Secondary | ICD-10-CM | POA: Diagnosis present

## 2023-04-24 DIAGNOSIS — Z7982 Long term (current) use of aspirin: Secondary | ICD-10-CM

## 2023-04-24 DIAGNOSIS — E8809 Other disorders of plasma-protein metabolism, not elsewhere classified: Secondary | ICD-10-CM | POA: Diagnosis present

## 2023-04-24 DIAGNOSIS — E11621 Type 2 diabetes mellitus with foot ulcer: Secondary | ICD-10-CM | POA: Diagnosis present

## 2023-04-24 DIAGNOSIS — F1290 Cannabis use, unspecified, uncomplicated: Secondary | ICD-10-CM | POA: Diagnosis present

## 2023-04-24 DIAGNOSIS — E1122 Type 2 diabetes mellitus with diabetic chronic kidney disease: Secondary | ICD-10-CM | POA: Diagnosis present

## 2023-04-24 DIAGNOSIS — I771 Stricture of artery: Secondary | ICD-10-CM | POA: Diagnosis not present

## 2023-04-24 DIAGNOSIS — E785 Hyperlipidemia, unspecified: Secondary | ICD-10-CM | POA: Diagnosis present

## 2023-04-24 DIAGNOSIS — Z955 Presence of coronary angioplasty implant and graft: Secondary | ICD-10-CM | POA: Diagnosis not present

## 2023-04-24 DIAGNOSIS — E1159 Type 2 diabetes mellitus with other circulatory complications: Secondary | ICD-10-CM | POA: Diagnosis present

## 2023-04-24 DIAGNOSIS — K869 Disease of pancreas, unspecified: Secondary | ICD-10-CM | POA: Diagnosis present

## 2023-04-24 DIAGNOSIS — Z794 Long term (current) use of insulin: Secondary | ICD-10-CM

## 2023-04-24 DIAGNOSIS — I2510 Atherosclerotic heart disease of native coronary artery without angina pectoris: Secondary | ICD-10-CM | POA: Diagnosis present

## 2023-04-24 DIAGNOSIS — I252 Old myocardial infarction: Secondary | ICD-10-CM | POA: Diagnosis not present

## 2023-04-24 DIAGNOSIS — H409 Unspecified glaucoma: Secondary | ICD-10-CM | POA: Diagnosis present

## 2023-04-24 DIAGNOSIS — Z87891 Personal history of nicotine dependence: Secondary | ICD-10-CM | POA: Diagnosis not present

## 2023-04-24 DIAGNOSIS — N183 Chronic kidney disease, stage 3 unspecified: Secondary | ICD-10-CM | POA: Diagnosis present

## 2023-04-24 DIAGNOSIS — I7092 Chronic total occlusion of artery of the extremities: Secondary | ICD-10-CM | POA: Diagnosis present

## 2023-04-24 DIAGNOSIS — I96 Gangrene, not elsewhere classified: Secondary | ICD-10-CM | POA: Diagnosis present

## 2023-04-24 DIAGNOSIS — E1152 Type 2 diabetes mellitus with diabetic peripheral angiopathy with gangrene: Secondary | ICD-10-CM | POA: Diagnosis present

## 2023-04-24 DIAGNOSIS — Z6825 Body mass index (BMI) 25.0-25.9, adult: Secondary | ICD-10-CM

## 2023-04-24 DIAGNOSIS — N1832 Chronic kidney disease, stage 3b: Secondary | ICD-10-CM | POA: Diagnosis present

## 2023-04-24 DIAGNOSIS — I129 Hypertensive chronic kidney disease with stage 1 through stage 4 chronic kidney disease, or unspecified chronic kidney disease: Secondary | ICD-10-CM | POA: Diagnosis present

## 2023-04-24 DIAGNOSIS — I10 Essential (primary) hypertension: Secondary | ICD-10-CM | POA: Diagnosis present

## 2023-04-24 DIAGNOSIS — E872 Acidosis, unspecified: Secondary | ICD-10-CM | POA: Diagnosis not present

## 2023-04-24 DIAGNOSIS — Z79899 Other long term (current) drug therapy: Secondary | ICD-10-CM

## 2023-04-24 DIAGNOSIS — F339 Major depressive disorder, recurrent, unspecified: Secondary | ICD-10-CM | POA: Diagnosis present

## 2023-04-24 DIAGNOSIS — N400 Enlarged prostate without lower urinary tract symptoms: Secondary | ICD-10-CM | POA: Diagnosis present

## 2023-04-24 DIAGNOSIS — Z8249 Family history of ischemic heart disease and other diseases of the circulatory system: Secondary | ICD-10-CM

## 2023-04-24 DIAGNOSIS — Z7951 Long term (current) use of inhaled steroids: Secondary | ICD-10-CM

## 2023-04-24 DIAGNOSIS — J449 Chronic obstructive pulmonary disease, unspecified: Secondary | ICD-10-CM | POA: Diagnosis not present

## 2023-04-24 DIAGNOSIS — I7090 Unspecified atherosclerosis: Secondary | ICD-10-CM

## 2023-04-24 DIAGNOSIS — E871 Hypo-osmolality and hyponatremia: Secondary | ICD-10-CM | POA: Diagnosis present

## 2023-04-24 DIAGNOSIS — I70261 Atherosclerosis of native arteries of extremities with gangrene, right leg: Secondary | ICD-10-CM | POA: Diagnosis present

## 2023-04-24 DIAGNOSIS — I70235 Atherosclerosis of native arteries of right leg with ulceration of other part of foot: Secondary | ICD-10-CM | POA: Diagnosis not present

## 2023-04-24 DIAGNOSIS — F4310 Post-traumatic stress disorder, unspecified: Secondary | ICD-10-CM | POA: Diagnosis present

## 2023-04-24 DIAGNOSIS — I25119 Atherosclerotic heart disease of native coronary artery with unspecified angina pectoris: Secondary | ICD-10-CM | POA: Diagnosis not present

## 2023-04-24 DIAGNOSIS — H35033 Hypertensive retinopathy, bilateral: Secondary | ICD-10-CM | POA: Diagnosis present

## 2023-04-24 DIAGNOSIS — E118 Type 2 diabetes mellitus with unspecified complications: Secondary | ICD-10-CM

## 2023-04-24 DIAGNOSIS — Z7902 Long term (current) use of antithrombotics/antiplatelets: Secondary | ICD-10-CM

## 2023-04-24 DIAGNOSIS — Z888 Allergy status to other drugs, medicaments and biological substances status: Secondary | ICD-10-CM

## 2023-04-24 HISTORY — DX: Gangrene, not elsewhere classified: I96

## 2023-04-24 LAB — CBC WITH DIFFERENTIAL/PLATELET
Abs Immature Granulocytes: 0.01 10*3/uL (ref 0.00–0.07)
Basophils Absolute: 0 10*3/uL (ref 0.0–0.1)
Basophils Relative: 1 %
Eosinophils Absolute: 0.5 10*3/uL (ref 0.0–0.5)
Eosinophils Relative: 8 %
HCT: 38.5 % — ABNORMAL LOW (ref 39.0–52.0)
Hemoglobin: 13.6 g/dL (ref 13.0–17.0)
Immature Granulocytes: 0 %
Lymphocytes Relative: 27 %
Lymphs Abs: 1.8 10*3/uL (ref 0.7–4.0)
MCH: 27.5 pg (ref 26.0–34.0)
MCHC: 35.3 g/dL (ref 30.0–36.0)
MCV: 77.9 fL — ABNORMAL LOW (ref 80.0–100.0)
Monocytes Absolute: 0.6 10*3/uL (ref 0.1–1.0)
Monocytes Relative: 9 %
Neutro Abs: 3.7 10*3/uL (ref 1.7–7.7)
Neutrophils Relative %: 55 %
Platelets: 178 10*3/uL (ref 150–400)
RBC: 4.94 MIL/uL (ref 4.22–5.81)
RDW: 13.6 % (ref 11.5–15.5)
WBC: 6.7 10*3/uL (ref 4.0–10.5)
nRBC: 0 % (ref 0.0–0.2)

## 2023-04-24 LAB — COMPREHENSIVE METABOLIC PANEL
ALT: 23 U/L (ref 0–44)
AST: 28 U/L (ref 15–41)
Albumin: 3.8 g/dL (ref 3.5–5.0)
Alkaline Phosphatase: 71 U/L (ref 38–126)
Anion gap: 11 (ref 5–15)
BUN: 28 mg/dL — ABNORMAL HIGH (ref 8–23)
CO2: 20 mmol/L — ABNORMAL LOW (ref 22–32)
Calcium: 9 mg/dL (ref 8.9–10.3)
Chloride: 97 mmol/L — ABNORMAL LOW (ref 98–111)
Creatinine, Ser: 1.63 mg/dL — ABNORMAL HIGH (ref 0.61–1.24)
GFR, Estimated: 47 mL/min — ABNORMAL LOW (ref >60)
Glucose, Bld: 215 mg/dL — ABNORMAL HIGH (ref 70–99)
Potassium: 4 mmol/L (ref 3.5–5.1)
Sodium: 128 mmol/L — ABNORMAL LOW (ref 135–145)
Total Bilirubin: 0.4 mg/dL (ref 0.3–1.2)
Total Protein: 6.3 g/dL — ABNORMAL LOW (ref 6.5–8.1)

## 2023-04-24 LAB — HIV ANTIBODY (ROUTINE TESTING W REFLEX): HIV Screen 4th Generation wRfx: NONREACTIVE

## 2023-04-24 LAB — LACTIC ACID, PLASMA
Lactic Acid, Venous: 1 mmol/L (ref 0.5–1.9)
Lactic Acid, Venous: 0.7 mmol/L (ref 0.5–1.9)

## 2023-04-24 LAB — CBG MONITORING, ED: Glucose-Capillary: 190 mg/dL — ABNORMAL HIGH (ref 70–99)

## 2023-04-24 LAB — GLUCOSE, CAPILLARY
Glucose-Capillary: 213 mg/dL — ABNORMAL HIGH (ref 70–99)
Glucose-Capillary: 220 mg/dL — ABNORMAL HIGH (ref 70–99)

## 2023-04-24 LAB — C-REACTIVE PROTEIN: CRP: 1.2 mg/dL — ABNORMAL HIGH (ref <1.0)

## 2023-04-24 LAB — SEDIMENTATION RATE: Sed Rate: 5 mm/hr (ref 0–16)

## 2023-04-24 MED ORDER — HYDRALAZINE HCL 50 MG PO TABS: 100.0000 mg | ORAL_TABLET | Freq: Two times a day (BID) | ORAL | Status: DC

## 2023-04-24 MED ORDER — AMLODIPINE BESYLATE 5 MG PO TABS: 10.0000 mg | ORAL_TABLET | Freq: Every day | ORAL | Status: DC

## 2023-04-24 MED ORDER — HYDRALAZINE HCL 50 MG PO TABS
100.0000 mg | ORAL_TABLET | Freq: Two times a day (BID) | ORAL | Status: DC
  Administered 2023-04-24 – 2023-04-30 (×11): 100 mg via ORAL
  Filled 2023-04-24 (×12): qty 2

## 2023-04-24 MED ORDER — HEPARIN SODIUM (PORCINE) 5000 UNIT/ML IJ SOLN
5000.0000 [IU] | Freq: Two times a day (BID) | INTRAMUSCULAR | Status: DC
  Administered 2023-04-24 – 2023-04-30 (×11): 5000 [IU] via SUBCUTANEOUS
  Filled 2023-04-24 (×11): qty 1

## 2023-04-24 MED ORDER — MAGNESIUM OXIDE -MG SUPPLEMENT 400 (240 MG) MG PO TABS
400.0000 mg | ORAL_TABLET | Freq: Every day | ORAL | Status: DC
  Administered 2023-04-25 – 2023-04-30 (×6): 400 mg via ORAL
  Filled 2023-04-24 (×6): qty 1

## 2023-04-24 MED ORDER — DOXAZOSIN MESYLATE 4 MG PO TABS: 4.0000 mg | ORAL_TABLET | Freq: Every day | ORAL | Status: DC

## 2023-04-24 MED ORDER — LINAGLIPTIN 5 MG PO TABS
5.0000 mg | ORAL_TABLET | Freq: Every day | ORAL | Status: DC
  Administered 2023-04-25 – 2023-04-30 (×6): 5 mg via ORAL
  Filled 2023-04-24 (×7): qty 1

## 2023-04-24 MED ORDER — HYDROXYZINE HCL 25 MG PO TABS
25.0000 mg | ORAL_TABLET | Freq: Three times a day (TID) | ORAL | Status: DC | PRN
  Administered 2023-04-26 – 2023-04-29 (×5): 25 mg via ORAL
  Filled 2023-04-24 (×5): qty 1

## 2023-04-24 MED ORDER — ACETAMINOPHEN 500 MG PO TABS
1000.0000 mg | ORAL_TABLET | Freq: Once | ORAL | Status: AC
  Administered 2023-04-24: 1000 mg via ORAL
  Filled 2023-04-24: qty 2

## 2023-04-24 MED ORDER — CLOPIDOGREL BISULFATE 75 MG PO TABS
75.0000 mg | ORAL_TABLET | Freq: Every day | ORAL | Status: DC
  Administered 2023-04-25 – 2023-04-30 (×6): 75 mg via ORAL
  Filled 2023-04-24 (×6): qty 1

## 2023-04-24 MED ORDER — AMLODIPINE BESYLATE 10 MG PO TABS
10.0000 mg | ORAL_TABLET | Freq: Every day | ORAL | Status: DC
  Administered 2023-04-24 – 2023-04-30 (×7): 10 mg via ORAL
  Filled 2023-04-24: qty 2
  Filled 2023-04-24: qty 1
  Filled 2023-04-24: qty 2
  Filled 2023-04-24: qty 1
  Filled 2023-04-24: qty 2
  Filled 2023-04-24: qty 1
  Filled 2023-04-24: qty 2

## 2023-04-24 MED ORDER — OXYCODONE HCL 5 MG PO TABS
5.0000 mg | ORAL_TABLET | Freq: Four times a day (QID) | ORAL | Status: DC | PRN
  Administered 2023-04-24 – 2023-04-25 (×3): 5 mg via ORAL
  Filled 2023-04-24 (×3): qty 1

## 2023-04-24 MED ORDER — HYDROMORPHONE HCL 1 MG/ML IJ SOLN
0.5000 mg | Freq: Once | INTRAMUSCULAR | Status: AC
  Administered 2023-04-24: 0.5 mg via INTRAVENOUS
  Filled 2023-04-24: qty 1

## 2023-04-24 MED ORDER — ASPIRIN 81 MG PO TBEC
81.0000 mg | DELAYED_RELEASE_TABLET | Freq: Every day | ORAL | Status: DC
  Administered 2023-04-25 – 2023-04-30 (×5): 81 mg via ORAL
  Filled 2023-04-24 (×5): qty 1

## 2023-04-24 MED ORDER — INSULIN ASPART 100 UNIT/ML IJ SOLN
0.0000 [IU] | Freq: Three times a day (TID) | INTRAMUSCULAR | Status: DC
  Administered 2023-04-24: 3 [IU] via SUBCUTANEOUS
  Administered 2023-04-25: 2 [IU] via SUBCUTANEOUS
  Administered 2023-04-25: 3 [IU] via SUBCUTANEOUS
  Administered 2023-04-26: 2 [IU] via SUBCUTANEOUS
  Administered 2023-04-26 – 2023-04-27 (×2): 1 [IU] via SUBCUTANEOUS
  Administered 2023-04-28 – 2023-04-30 (×6): 2 [IU] via SUBCUTANEOUS

## 2023-04-24 MED ORDER — CARVEDILOL 12.5 MG PO TABS
12.5000 mg | ORAL_TABLET | Freq: Two times a day (BID) | ORAL | Status: DC
  Administered 2023-04-24 – 2023-04-30 (×11): 12.5 mg via ORAL
  Filled 2023-04-24: qty 4
  Filled 2023-04-24: qty 1
  Filled 2023-04-24: qty 4
  Filled 2023-04-24 (×2): qty 1
  Filled 2023-04-24 (×4): qty 4
  Filled 2023-04-24 (×2): qty 1

## 2023-04-24 MED ORDER — HYDROMORPHONE HCL 1 MG/ML IJ SOLN
0.5000 mg | Freq: Once | INTRAMUSCULAR | Status: AC
  Administered 2023-04-24: 0.5 mg via INTRAVENOUS

## 2023-04-24 MED ORDER — METRONIDAZOLE 500 MG/100ML IV SOLN
500.0000 mg | Freq: Once | INTRAVENOUS | Status: AC
  Administered 2023-04-24: 500 mg via INTRAVENOUS
  Filled 2023-04-24: qty 100

## 2023-04-24 MED ORDER — SODIUM CHLORIDE 0.9 % IV BOLUS
1000.0000 mL | Freq: Once | INTRAVENOUS | Status: AC
  Administered 2023-04-24: 1000 mL via INTRAVENOUS

## 2023-04-24 MED ORDER — ALBUTEROL SULFATE (2.5 MG/3ML) 0.083% IN NEBU
3.0000 mL | INHALATION_SOLUTION | Freq: Four times a day (QID) | RESPIRATORY_TRACT | Status: DC | PRN
  Administered 2023-04-28: 3 mL via RESPIRATORY_TRACT
  Filled 2023-04-24: qty 3

## 2023-04-24 MED ORDER — ATORVASTATIN CALCIUM 80 MG PO TABS
80.0000 mg | ORAL_TABLET | Freq: Every day | ORAL | Status: DC
  Administered 2023-04-24 – 2023-04-30 (×7): 80 mg via ORAL
  Filled 2023-04-24 (×3): qty 2
  Filled 2023-04-24: qty 1
  Filled 2023-04-24: qty 2
  Filled 2023-04-24 (×2): qty 1

## 2023-04-24 MED ORDER — LATANOPROST 0.005 % OP SOLN
1.0000 [drp] | Freq: Every day | OPHTHALMIC | Status: DC
  Administered 2023-04-24 – 2023-04-29 (×5): 1 [drp] via OPHTHALMIC
  Filled 2023-04-24 (×2): qty 2.5

## 2023-04-24 MED ORDER — IOHEXOL 350 MG/ML SOLN
100.0000 mL | Freq: Once | INTRAVENOUS | Status: AC | PRN
  Administered 2023-04-24: 100 mL via INTRAVENOUS

## 2023-04-24 MED ORDER — INSULIN GLARGINE-YFGN 100 UNIT/ML ~~LOC~~ SOLN
25.0000 [IU] | Freq: Every day | SUBCUTANEOUS | Status: DC
  Administered 2023-04-25 – 2023-04-30 (×5): 25 [IU] via SUBCUTANEOUS
  Filled 2023-04-24 (×7): qty 0.25

## 2023-04-24 MED ORDER — ISOSORBIDE MONONITRATE ER 30 MG PO TB24
30.0000 mg | ORAL_TABLET | Freq: Every day | ORAL | Status: DC
  Administered 2023-04-24 – 2023-04-30 (×7): 30 mg via ORAL
  Filled 2023-04-24 (×7): qty 1

## 2023-04-24 MED ORDER — BRIMONIDINE TARTRATE 0.2 % OP SOLN
1.0000 [drp] | Freq: Three times a day (TID) | OPHTHALMIC | Status: DC
  Administered 2023-04-25 – 2023-04-30 (×12): 1 [drp] via OPHTHALMIC
  Filled 2023-04-24 (×2): qty 5

## 2023-04-24 MED ORDER — HYDROMORPHONE HCL 1 MG/ML IJ SOLN
0.5000 mg | INTRAMUSCULAR | Status: DC | PRN
  Administered 2023-04-24 – 2023-04-29 (×21): 0.5 mg via INTRAVENOUS
  Filled 2023-04-24: qty 0.5
  Filled 2023-04-24 (×2): qty 1
  Filled 2023-04-24 (×2): qty 0.5
  Filled 2023-04-24 (×2): qty 1
  Filled 2023-04-24 (×2): qty 0.5
  Filled 2023-04-24: qty 1
  Filled 2023-04-24: qty 0.5
  Filled 2023-04-24 (×2): qty 1
  Filled 2023-04-24 (×2): qty 0.5
  Filled 2023-04-24: qty 1
  Filled 2023-04-24 (×3): qty 0.5
  Filled 2023-04-24: qty 1
  Filled 2023-04-24: qty 0.5
  Filled 2023-04-24: qty 1
  Filled 2023-04-24: qty 0.5

## 2023-04-24 MED ORDER — MOMETASONE FURO-FORMOTEROL FUM 200-5 MCG/ACT IN AERO
2.0000 | INHALATION_SPRAY | Freq: Two times a day (BID) | RESPIRATORY_TRACT | Status: DC
  Administered 2023-04-24 – 2023-04-29 (×7): 2 via RESPIRATORY_TRACT
  Filled 2023-04-24 (×4): qty 8.8

## 2023-04-24 MED ORDER — BRIMONIDINE TARTRATE 0.2 % OP SOLN
1.0000 [drp] | Freq: Two times a day (BID) | OPHTHALMIC | Status: DC
  Filled 2023-04-24: qty 5

## 2023-04-24 MED ORDER — SODIUM CHLORIDE 0.9 % IV SOLN
2.0000 g | Freq: Once | INTRAVENOUS | Status: AC
  Administered 2023-04-24: 2 g via INTRAVENOUS
  Filled 2023-04-24: qty 20

## 2023-04-24 MED ORDER — TRAZODONE HCL 50 MG PO TABS
75.0000 mg | ORAL_TABLET | Freq: Every day | ORAL | Status: DC
  Administered 2023-04-24 – 2023-04-29 (×6): 75 mg via ORAL
  Filled 2023-04-24 (×6): qty 2

## 2023-04-24 NOTE — Consult Note (Addendum)
Hospital Consult    Reason for Consult:  Right foot wounds Referring Physician:  ED MRN #:  [id number]  History of Present Illness: This is a 62 y.o. male with hx CAD, CKD stage III, HTN, HLD, DM, COPD that vascular surgery has been consulted for right foot wounds.  Patient states he got some new shoes that rubbed against his foot and blistered his right foot about 2-3 weeks ago.  He then started putting Aspercreme on the foot.  He states his pain is 10 out of 10.  No previous vascular interventions.  States he was referred to vascular surgery from the [place] but has not seen anybody.  He is ambulatory.  Lives here in [place].  Past Medical History:  Diagnosis Date   Alcohol abuse    CAD (coronary artery disease)    a. Reported MI in 2012 s/p 2 stents;  b. 08/2012 Cath: LM 20, LAD 20 diff ISR, jailed septal - 99%, LCX 30ost, RI large, min irregs, RCA 30p, 20-30 ISR-->Med Rx; c. 2017 Pt reports Neg [facility] @ [facility].   CKD (chronic kidney disease), stage III (HCC)    Cocaine abuse (HCC)    COPD (chronic obstructive pulmonary disease) (HCC)    Depression    Diabetes mellitus without complication (HCC)    Hyperlipidemia    Hypertension    Hypertensive urgency 02/11/2021   Noncompliance     Past Surgical History:  Procedure Laterality Date   CARDIAC CATHETERIZATION     CORONARY STENT PLACEMENT     LEFT HEART CATH AND CORONARY ANGIOGRAPHY N/A 08/22/2020   Procedure: LEFT HEART CATH AND CORONARY ANGIOGRAPHY;  Surgeon: Lyn Records, MD;  Location: [facility];  Service: Cardiovascular;  Laterality: N/A;   LEFT HEART CATH AND CORONARY ANGIOGRAPHY N/A 01/28/2022   Procedure: LEFT HEART CATH AND CORONARY ANGIOGRAPHY;  Surgeon: Lennette Bihari, MD;  Location: [facility];  Service: Cardiovascular;  Laterality: N/A;   LEFT HEART CATHETERIZATION WITH CORONARY ANGIOGRAM Bilateral 08/25/2012   Procedure: LEFT HEART CATHETERIZATION WITH CORONARY ANGIOGRAM;  Surgeon: Kathleene Hazel, MD;  Location[facility] CATH LAB;  Service: Cardiovascular;  Laterality: Bilateral;    Allergies  Allergen Reactions   Lisinopril-Hydrochlorothiazide Swelling    Causes swelling of lips   Simvastatin Other (See Comments)    ALT (SGPT) level raised, Aspartate aminotransferase serum level raised    Prior to Admission medications   Medication Sig Start Date End Date Taking? Authorizing Provider  albuterol (PROVENTIL HFA;VENTOLIN HFA) 108 (90 BASE) MCG/ACT inhaler Inhale 2 puffs into the lungs every 6 (six) hours as needed for wheezing or shortness of breath.     [provider]  amLODipine (NORVASC) 10 MG tablet Take 1 tablet (10 mg total) by mouth daily. 03/12/21   [name], Deloris Ping, MD  aspirin EC 81 MG tablet Take 81 mg by mouth daily. Swallow whole.    [provider]  atorvastatin (LIPITOR) 80 MG tablet Take 1 tablet (80 mg total) by mouth daily. 01/30/22   Marjie Skiff E, PA-C  brimonidine (ALPHAGAN) 0.2 % ophthalmic solution Place 1 drop into both eyes 2 (two) times daily.  04/11/15   [provider]  carvedilol (COREG) 6.25 MG tablet Take 1 tablet (6.25 mg total) by mouth 2 (two) times daily with a meal. 01/29/22   Corrin Parker, PA-C  clopidogrel (PLAVIX) 75 MG tablet Take 1 tablet (75 mg total) by mouth daily with breakfast. 01/30/22   Marjie Skiff  E, PA-C  divalproex (DEPAKOTE ER) 250 MG 24 hr tablet Take 250 mg by mouth at bedtime. Patient not taking: Reported on 10/10/2022 08/07/22   [provider]  doxazosin (CARDURA) 8 MG tablet Take 4 mg by mouth at bedtime. 04/15/20   [provider]  fluticasone-salmeterol (ADVAIR) 500-50 MCG/ACT AEPB Inhale 1 puff into the lungs in the morning and at bedtime. 03/15/22   [provider]  furosemide (LASIX) 20 MG tablet Take 1 tablet by mouth every other day. 09/21/20   [provider]  glucose 4 GM chewable tablet Chew 1 tablet (4 g total) by mouth as needed for low blood  sugar (Blood sugars less than 70). 08/11/22   Bing Neighbors, NP  hydrALAZINE (APRESOLINE) 100 MG tablet Take 1 tablet (100 mg total) by mouth 3 (three) times daily. Patient taking differently: Take 100 mg by mouth 2 (two) times daily. 03/12/21   [name], Deloris Ping, MD  hydrOXYzine (ATARAX) 25 MG tablet Take 1 tablet (25 mg total) by mouth 3 (three) times daily as needed for anxiety. 08/11/22   Bing Neighbors, NP  insulin glargine-yfgn (SEMGLEE) 100 UNIT/ML injection Inject 0.3 mLs (30 Units total) into the skin daily with breakfast. 08/12/22   Bing Neighbors, NP  latanoprost (XALATAN) 0.005 % ophthalmic solution Place 1 drop into both eyes at bedtime.  04/11/15   [provider]  magnesium oxide (MAG-OX) 400 (240 Mg) MG tablet Take 1 tablet (400 mg total) by mouth daily. 08/12/22   Bing Neighbors, NP  Multiple Vitamin (MULTIVITAMIN WITH MINERALS) TABS tablet Take 1 tablet by mouth daily.    [provider]  nitroGLYCERIN (NITROSTAT) 0.4 MG SL tablet Place 1 tablet (0.4 mg total) under the tongue every 5 (five) minutes as needed for chest pain. 01/29/22   Corrin Parker, PA-C  Omega-3 Fatty Acids (FISH OIL) 1000 MG CAPS Take 1,000 mg by mouth daily.    [provider]  tadalafil (CIALIS) 10 MG tablet Take 1 tablet (10 mg total) by mouth daily as needed for erectile dysfunction. 10/21/22   Sharlene Dory, PA-C  traZODone (DESYREL) 150 MG tablet Take 75 mg by mouth at bedtime.    [provider]    Social History   Socioeconomic History   Marital status: Married    Spouse name: Not on file   Number of children: Not on file   Years of education: Not on file   Highest education level: Not on file  Occupational History   Not on file  Tobacco Use   Smoking status: Former    Types: Cigarettes    Quit date: 11/25/2007    Years since quitting: 15.4   Smokeless tobacco: Never  Vaping Use   Vaping Use: Never used  Substance and Sexual Activity    Alcohol use: Yes    Comment: Used to drink heavily - says currently 2 beers/day.   Drug use: Yes    Types: Marijuana    Comment: reports not using cocaine   Sexual activity: Yes    Birth control/protection: None  Other Topics Concern   Not on file  Social History Narrative   Lives in [place] by himself.  Does not work or routinely exercise.   Social Determinants of Health   Financial Resource Strain: Not on file  Food Insecurity: No Food Insecurity (07/25/2022)   Hunger Vital Sign    Worried About Running Out of Food in the Last Year: Never true  Ran Out of Food in the Last Year: Never true  Transportation Needs: No Transportation Needs (07/25/2022)   PRAPARE - Administrator, Civil Service (Medical): No    Lack of Transportation (Non-Medical): No  Physical Activity: Not on file  Stress: Not on file  Social Connections: Not on file  Intimate Partner Violence: Not on file     Family History  Problem Relation Age of Onset   Heart disease Mother        MI 71s    ROS: [x]  Positive   [ ]  Negative   [ ]  All sytems reviewed and are negative  Cardiovascular: []  chest pain/pressure []  palpitations []  SOB lying flat []  DOE []  pain in legs while walking []  pain in legs at rest []  pain in legs at night []  non-healing ulcers []  hx of DVT []  swelling in legs  Pulmonary: []  productive cough []  asthma/wheezing []  home O2  Neurologic: []  weakness in []  arms []  legs []  numbness in []  arms []  legs []  hx of CVA []  mini stroke [] difficulty speaking or slurred speech []  temporary loss of vision in one eye []  dizziness  Hematologic: []  hx of cancer []  bleeding problems []  problems with blood clotting easily  Endocrine:   []  diabetes []  thyroid disease  GI []  vomiting blood []  blood in stool  GU: []  CKD/renal failure []  HD--[]  M/W/F or []  T/T/S []  burning with urination []  blood in urine  Psychiatric: []  anxiety []  depression  Musculoskeletal: []   arthritis []  joint pain  Integumentary: []  rashes []  ulcers  Constitutional: []  fever []  chills   Physical Examination  Vitals:   04/24/23 1227 04/24/23 1315  BP:  (!) 149/80  Pulse:  (!) 59  Resp:  13  Temp: 98.2 F (36.8 C)   SpO2:  100%   Body mass index is 25.1 kg/m.  General:  WDWN in NAD Gait: Not observed HENT: WNL, normocephalic Pulmonary: normal non-labored breathing Cardiac: regular, without  Murmurs, rubs or gallops Abdomen:  soft, NT/ND, no masses Vascular Exam/Pulses: Palpable femoral pulses bilaterally Palpable popliteal pulses bilaterally Brisk right PT and peroneal signal Right 1st, 2nd 3rd toe ulcers as pictured Musculoskeletal: no muscle wasting or atrophy  Neurologic: A&O X 3; Appropriate Affect ; SENSATION: normal; MOTOR FUNCTION:  moving all extremities equally. Speech is fluent/normal        CBC    Component Value Date/Time   WBC 6.7 04/24/2023 0842   RBC 4.94 04/24/2023 0842   HGB 13.6 04/24/2023 0842   HCT 38.5 (L) 04/24/2023 0842   PLT 178 04/24/2023 0842   MCV 77.9 (L) 04/24/2023 0842   MCH 27.5 04/24/2023 0842   MCHC 35.3 04/24/2023 0842   RDW 13.6 04/24/2023 0842   LYMPHSABS 1.8 04/24/2023 0842   MONOABS 0.6 04/24/2023 0842   EOSABS 0.5 04/24/2023 0842   BASOSABS 0.0 04/24/2023 0842    BMET    Component Value Date/Time   NA 128 (L) 04/24/2023 0842   NA 131 (L) 12/14/2020 0849   K 4.0 04/24/2023 0842   CL 97 (L) 04/24/2023 0842   CO2 20 (L) 04/24/2023 0842   GLUCOSE 215 (H) 04/24/2023 0842   BUN 28 (H) 04/24/2023 0842   BUN 26 12/14/2020 0849   CREATININE 1.63 (H) 04/24/2023 0842   CALCIUM 9.0 04/24/2023 0842   GFRNONAA 47 (L) 04/24/2023 0842   GFRAA 53 (L) 12/14/2020 0849    COAGS: Lab Results  Component Value Date   INR  1.1 01/27/2022   INR 1.04 08/29/2017   INR 1.00 10/17/2015     Non-Invasive Vascular Imaging:    ABI's pending  ASSESSMENT/PLAN: This is a 62 y.o. male with hx CAD, CKD stage III,  HTN, HLD, DM, COPD that vascular surgery has been consulted for right foot wounds.  He is developing an eschar on his right first, second, and third toes where he rubbed a blister on the right foot with his shoe about 2-3 weeks ago.  On exam he actually has pretty brisk posterior tibial and peroneal Doppler signal.  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  He does have a palpable popliteal pulse on exam.  Discussed could plan angiogram next week.  He does not feel like he can tolerate his level of pain and I discussed this being done as an outpatient.  I have asked the ED to get the hospitalist involved for admission given his comorbidities.  Will put him on the angiogram scheduled for Monday in the Cath Lab.  I discussed details of surgery with the patient and that this is for limb salvage including risks and benefits.  I have recommended ABIs which I have ordered.  Continue aspirin statin Plavix.  Cephus Shelling, MD [facility] Office: [phone]  Cephus Shelling

## 2023-04-24 NOTE — ED Provider Notes (Signed)
[place] EMERGENCY DEPARTMENT AT [facility] Provider Note  CSN: [id number] Arrival date & time: 04/24/23 4098  Chief Complaint(s) Toe Pain and Foot Pain  HPI Jose [name] is a 62 y.o. male with history of CKD, coronary artery disease, hypertension, hyperlipidemia, diabetes presenting to the emergency department with color change..  Patient reports that he has had wounds to his right second and third toes for over a month.  He has been following at the [facility] for this and has tried course of antibiotics.  He was seen by podiatry and dermatology who recommended vascular surgery referral.  He states he has not yet seen a vascular surgeon as far as he knows.  No fevers or chills.  Reports worsening pain discomfort.  Reports that the skin has been peeling and there has been some oozing.  He was taking Norco at home without relief.   Past Medical History Past Medical History:  Diagnosis Date   Alcohol abuse    CAD (coronary artery disease)    a. Reported MI in 2012 s/p 2 stents;  b. 08/2012 Cath: LM 20, LAD 20 diff ISR, jailed septal - 99%, LCX 30ost, RI large, min irregs, RCA 30p, 20-30 ISR-->Med Rx; c. 2017 [facility]   CKD (chronic kidney disease), stage III (HCC)    Cocaine abuse (HCC)    COPD (chronic obstructive pulmonary disease) (HCC)    Depression    Diabetes mellitus without complication (HCC)    Hyperlipidemia    Hypertension    Hypertensive urgency 02/11/2021   Noncompliance    Patient Active Problem List   Diagnosis Date Noted   PTSD (post-traumatic stress disorder) 08/14/2022   Nightmares associated with chronic post-traumatic stress disorder 08/14/2022   Major depressive disorder, 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/11/2022   Suicidal ideation 08/11/2022   Abnormality of aortic valve 01/29/2022   Bradycardia 01/29/2022   Hypokalemia 01/29/2022   Unstable angina (HCC) 01/27/2022   Hypertensive urgency 02/11/2021    Chest pain 02/11/2021   NSTEMI (non-ST elevated myocardial infarction) (HCC) 08/22/2020   Acute coronary syndrome (HCC) 08/21/2020   Hypertensive emergency 08/21/2020   Depression    Elevated troponin    Nausea and vomiting    Suspected COVID-19 virus infection    Diarrhea 04/27/2019   Hypertensive retinopathy of both eyes 02/01/2019   Chest pain with moderate risk for cardiac etiology 08/30/2017   Chest pain with high risk for cardiac etiology 08/28/2017   CKD (chronic kidney disease) stage 3, GFR 30-59 ml/min 08/28/2017   COPD (chronic obstructive pulmonary disease) (HCC) 08/28/2017   CAD (coronary artery disease) 04/10/2016   Hyperlipidemia 04/10/2016   Benign prostatic hyperplasia 04/10/2016   Diabetes (HCC) 04/10/2016   Hyperglycemia 10/17/2015   Type 2 diabetes mellitus with complication, with long-term current use of insulin (HCC) 10/17/2015   Acute renal failure superimposed on stage 3 chronic kidney disease (HCC) 10/17/2015   Hyperkalemia 10/17/2015   Dyslipidemia associated with type 2 diabetes mellitus (HCC) 10/17/2015   Alcohol abuse    Cocaine abuse (HCC) 06/24/2015   Hyponatremia 05/12/2015   Hypertension    Home Medication(s) Prior to Admission medications   Medication Sig Start Date End Date Taking? Authorizing Provider  albuterol (PROVENTIL HFA;VENTOLIN HFA) 108 (90 BASE) MCG/ACT inhaler Inhale 2 puffs into the lungs every 6 (six) hours as needed for wheezing or shortness of breath.     [provider]  amLODipine (NORVASC) 10 MG tablet Take 1  tablet (10 mg total) by mouth daily. 03/12/21   [name], Deloris Ping, MD  aspirin EC 81 MG tablet Take 81 mg by mouth daily. Swallow whole.    [provider]  atorvastatin (LIPITOR) 80 MG tablet Take 1 tablet (80 mg total) by mouth daily. 01/30/22   Marjie Skiff E, PA-C  brimonidine (ALPHAGAN) 0.2 % ophthalmic solution Place 1 drop into both eyes 2 (two) times daily.  04/11/15   [provider]   carvedilol (COREG) 6.25 MG tablet Take 1 tablet (6.25 mg total) by mouth 2 (two) times daily with a meal. 01/29/22   Corrin Parker, PA-C  clopidogrel (PLAVIX) 75 MG tablet Take 1 tablet (75 mg total) by mouth daily with breakfast. 01/30/22   Marjie Skiff E, PA-C  divalproex (DEPAKOTE ER) 250 MG 24 hr tablet Take 250 mg by mouth at bedtime. Patient not taking: Reported on 10/10/2022 08/07/22   [provider]  doxazosin (CARDURA) 8 MG tablet Take 4 mg by mouth at bedtime. 04/15/20   [provider]  fluticasone-salmeterol (ADVAIR) 500-50 MCG/ACT AEPB Inhale 1 puff into the lungs in the morning and at bedtime. 03/15/22   [provider]  furosemide (LASIX) 20 MG tablet Take 1 tablet by mouth every other day. 09/21/20   [provider]  glucose 4 GM chewable tablet Chew 1 tablet (4 g total) by mouth as needed for low blood sugar (Blood sugars less than 70). 08/11/22   Bing Neighbors, NP  hydrALAZINE (APRESOLINE) 100 MG tablet Take 1 tablet (100 mg total) by mouth 3 (three) times daily. Patient taking differently: Take 100 mg by mouth 2 (two) times daily. 03/12/21   [name], Deloris Ping, MD  hydrOXYzine (ATARAX) 25 MG tablet Take 1 tablet (25 mg total) by mouth 3 (three) times daily as needed for anxiety. 08/11/22   Bing Neighbors, NP  insulin glargine-yfgn (SEMGLEE) 100 UNIT/ML injection Inject 0.3 mLs (30 Units total) into the skin daily with breakfast. 08/12/22   Bing Neighbors, NP  latanoprost (XALATAN) 0.005 % ophthalmic solution Place 1 drop into both eyes at bedtime.  04/11/15   [provider]  magnesium oxide (MAG-OX) 400 (240 Mg) MG tablet Take 1 tablet (400 mg total) by mouth daily. 08/12/22   Bing Neighbors, NP  Multiple Vitamin (MULTIVITAMIN WITH MINERALS) TABS tablet Take 1 tablet by mouth daily.    [provider]  nitroGLYCERIN (NITROSTAT) 0.4 MG SL tablet Place 1 tablet (0.4 mg total) under the tongue every 5 (five) minutes  as needed for chest pain. 01/29/22   Corrin Parker, PA-C  Omega-3 Fatty Acids (FISH OIL) 1000 MG CAPS Take 1,000 mg by mouth daily.    [provider]  tadalafil (CIALIS) 10 MG tablet Take 1 tablet (10 mg total) by mouth daily as needed for erectile dysfunction. 10/21/22   Sharlene Dory, PA-C  traZODone (DESYREL) 150 MG tablet Take 75 mg by mouth at bedtime.    [provider]  Past Surgical History Past Surgical History:  Procedure Laterality Date   CARDIAC CATHETERIZATION     CORONARY STENT PLACEMENT     LEFT HEART CATH AND CORONARY ANGIOGRAPHY N/A 08/22/2020   Procedure: LEFT HEART CATH AND CORONARY ANGIOGRAPHY;  Surgeon: Lyn Records, MD;  Location: [facility] INVASIVE CV LAB;  Service: Cardiovascular;  Laterality: N/A;   LEFT HEART CATH AND CORONARY ANGIOGRAPHY N/A 01/28/2022   Procedure: LEFT HEART CATH AND CORONARY ANGIOGRAPHY;  Surgeon: Lennette Bihari, MD;  Location: [facility] INVASIVE CV LAB;  Service: Cardiovascular;  Laterality: N/A;   LEFT HEART CATHETERIZATION WITH CORONARY ANGIOGRAM Bilateral 08/25/2012   Procedure: LEFT HEART CATHETERIZATION WITH CORONARY ANGIOGRAM;  Surgeon: Kathleene Hazel, MD;  Location: [facility] CATH LAB;  Service: Cardiovascular;  Laterality: Bilateral;   Family History Family History  Problem Relation Age of Onset   Heart disease Mother        MI 50s    Social History Social History   Tobacco Use   Smoking status: Former    Types: Cigarettes    Quit date: 11/25/2007    Years since quitting: 15.4   Smokeless tobacco: Never  Vaping Use   Vaping Use: Never used  Substance Use Topics   Alcohol use: Yes    Comment: Used to drink heavily - says currently 2 beers/day.   Drug use: Yes    Types: Marijuana    Comment: reports not using cocaine   Allergies Lisinopril-hydrochlorothiazide and Simvastatin  Review of  Systems Review of Systems  All other systems reviewed and are negative.   Physical Exam Vital Signs  I have reviewed the triage vital signs BP (!) 149/80   Pulse (!) 59   Temp 98.2 F (36.8 C) (Oral)   Resp 13   Ht 5\' 11"  (1.803 m)   Wt 81.6 kg   SpO2 100%   BMI 25.10 kg/m  Physical Exam Vitals and nursing note reviewed.  Constitutional:      General: He is not in acute distress.    Appearance: Normal appearance.  HENT:     Head: Normocephalic and atraumatic.     Mouth/Throat:     Mouth: Mucous membranes are moist.  Eyes:     Conjunctiva/sclera: Conjunctivae normal.  Cardiovascular:     Rate and Rhythm: Normal rate and regular rhythm.     Comments: Nonpalpable DP pulses bilaterally Pulmonary:     Effort: Pulmonary effort is normal. No respiratory distress.     Breath sounds: Normal breath sounds.  Abdominal:     General: Abdomen is flat.     Palpations: Abdomen is soft.     Tenderness: There is no abdominal tenderness.  Musculoskeletal:     Right lower leg: No edema.     Left lower leg: No edema.  Skin:    General: Skin is warm and dry.     Capillary Refill: Capillary refill takes 2 to 3 seconds.     Comments: Skin discoloration with eschar on dorsum of toes 2 and 3 of the right foot, some purulent discharge in the webspace, significant tenderness, no significant warmth  Neurological:     General: No focal deficit present.     Mental Status: He is alert and oriented to person, place, and time. Mental status is at baseline.  Psychiatric:        Mood and Affect: Mood normal.        Behavior: Behavior normal.     ED Results and Treatments Labs (all labs  ordered are listed, but only abnormal results are displayed) Labs Reviewed  COMPREHENSIVE METABOLIC PANEL - Abnormal; Notable for the following components:      Result Value   Sodium 128 (*)    Chloride 97 (*)    CO2 20 (*)    Glucose, Bld 215 (*)    BUN 28 (*)    Creatinine, Ser 1.63 (*)    Total  Protein 6.3 (*)    GFR, Estimated 47 (*)    All other components within normal limits  CBC WITH DIFFERENTIAL/PLATELET - Abnormal; Notable for the following components:   HCT 38.5 (*)    MCV 77.9 (*)    All other components within normal limits  C-REACTIVE PROTEIN - Abnormal; Notable for the following components:   CRP 1.2 (*)    All other components within normal limits  CBG MONITORING, ED - Abnormal; Notable for the following components:   Glucose-Capillary 190 (*)    All other components within normal limits  LACTIC ACID, PLASMA  SEDIMENTATION RATE  LACTIC ACID, PLASMA  CANCER ANTIGEN 19-9                                                                                                                          Radiology CT Angio Aortobifemoral W and/or Wo Contrast  Result Date: 04/24/2023 CLINICAL DATA:  Claudication or leg ischemia. Toes turned black after wearing new shoes. EXAM: CT ANGIOGRAPHY OF ABDOMINAL AORTA WITH ILIOFEMORAL RUNOFF TECHNIQUE: Multidetector CT imaging of the abdomen, pelvis and lower extremities was performed using the standard protocol during bolus administration of intravenous contrast. Multiplanar CT image reconstructions and MIPs were obtained to evaluate the vascular anatomy. RADIATION DOSE REDUCTION: This exam was performed according to the departmental dose-optimization program which includes automated exposure control, adjustment of the mA and/or kV according to patient size and/or use of iterative reconstruction technique. CONTRAST:  OMNIPAQUE IOHEXOL 350 MG/ML SOLN COMPARISON:  CT abdomen pelvis 04/27/2019 FINDINGS: VASCULAR Aorta: Mild calcified atheromatous plaque without aneurysm or flow-limiting stenosis. Celiac: Patent without evidence of aneurysm, dissection, vasculitis or significant stenosis. SMA: Patent without evidence of aneurysm, dissection, vasculitis or significant stenosis. Renals: Both renal arteries are patent without evidence of aneurysm,  dissection, vasculitis, fibromuscular dysplasia or significant stenosis. IMA: Patent without evidence of aneurysm, dissection, vasculitis or significant stenosis. RIGHT Lower Extremity Inflow: Common, internal and external iliac arteries are patent without evidence of aneurysm, dissection, vasculitis or significant stenosis. Outflow: Minimal calcified and noncalcified atheromatous plaque of the right common femoral artery without significant stenosis. Superficial femoral and profunda femoris arteries are diffusely calcified without significant narrowing. Moderate to severe focal stenosis of the right popliteal artery best seen on image 444 of series 5. Runoff: Lower leg arteries are diffusely calcified which limits the ability to determine if they are opacified. The peroneal artery appears patent. Anterior and posterior tibial arteries are likely occluded. LEFT Lower Extremity Inflow: Common, internal and external iliac arteries are patent without evidence of  aneurysm, dissection, vasculitis or significant stenosis. Outflow: No significant stenosis of the common femoral artery. Superficial femoral, profundus femoris, and popliteal arteries are diffusely calcified but patent without significant stenosis. Runoff: Lower leg arteries are diffusely calcified which limits evaluation for opacification. The peroneal artery is patent to the level of the ankle. Unable to definitively determine if anterior and posterior tibial arteries are patent are occluded. Veins: No obvious venous abnormality within the limitations of this arterial phase study. Review of the MIP images confirms the above findings. NON-VASCULAR Lower chest: No acute abnormality. Extensive coronary artery calcifications. Hepatobiliary: No focal liver abnormality is seen. No gallstones, gallbladder wall thickening, or biliary dilatation. Pancreas: 3.5 x 2.3 cm pancreatic tail mass (image 75, series 5) requires further evaluation with pancreatic mass protocol  CT or MRI. No pancreatic duct dilatation. Spleen: Normal in size without focal abnormality. Adrenals/Urinary Tract: Adrenal glands are normal. 3 mm nonobstructing calculus present in the lower pole of the right kidney. Stomach/Bowel: Stomach is within normal limits. Appendix appears normal. No evidence of bowel wall thickening, distention, or inflammatory changes. Lymphatic: No enlarged abdominal or pelvic lymph nodes. Reproductive: Prostate is unremarkable. Other: No abdominal wall hernia or abnormality. No abdominopelvic ascites. Musculoskeletal: Posttraumatic changes of the right mid tibia and fibula consistent with prior healed fractures. Right 10 mm loose body seen in the left knee joint. Tibial intramedullary rod is intact. Deformity of proximal left tibia and fibula consistent with remote healed fractures. IMPRESSION: 1. Extensive diffuse atherosclerotic calcification of the lower leg arteries. 2. 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 3. Lower leg arteries are diffusely calcified which limits evaluation for opacification. There is at least single-vessel runoff to both ankles through the peroneal arteries. Unable to definitively determine if anterior and posterior tibial arteries are opacified. 4. 3.5 x 2.3 cm pancreatic tail mass requires further evaluation with nonemergent pancreatic mass protocol CT or MRI. 5. 3 mm nonobstructing calculus in the lower pole of the right kidney. Electronically Signed   By: Acquanetta Belling M.D.   On: 04/24/2023 13:00   DG Foot Complete Right  Result Date: 04/24/2023 CLINICAL DATA:  Right foot pain for 1 month. EXAM: RIGHT FOOT COMPLETE - 3+ VIEW COMPARISON:  None Available. FINDINGS: There is no evidence of acute fracture or dislocation. There is no evidence of arthropathy. Vascular calcifications are noted. Status post intramedullary rod fixation of right tibia. IMPRESSION: No acute abnormality seen. Electronically Signed   By: Lupita Raider M.D.    On: 04/24/2023 09:20    Pertinent labs & imaging results that were available during my care of the patient were reviewed by me and considered in my medical decision making (see MDM for details).  Medications Ordered in ED Medications  HYDROmorphone (DILAUDID) injection 0.5 mg (has no administration in time range)  cefTRIAXone (ROCEPHIN) 2 g in sodium chloride 0.9 % 100 mL IVPB (has no administration in time range)  metroNIDAZOLE (FLAGYL) IVPB 500 mg (has no administration in time range)  sodium chloride 0.9 % bolus 1,000 mL (1,000 mLs Intravenous New Bag/Given 04/24/23 0921)  HYDROmorphone (DILAUDID) injection 0.5 mg (0.5 mg Intravenous Given 04/24/23 0921)  iohexol (OMNIPAQUE) 350 MG/ML injection 100 mL (100 mLs Intravenous Contrast Given 04/24/23 1111)  HYDROmorphone (DILAUDID) injection 0.5 mg (0.5 mg Intravenous Given 04/24/23 1242)  acetaminophen (TYLENOL) tablet 1,000 mg (1,000 mg Oral Given 04/24/23 1242)  Procedures Procedures  (including critical care time)  Medical Decision Making / ED Course   MDM:  62 year old male presenting with toe pain and discoloration.  On exam, patient has wounds to his second and third toes.  No clearly palpable DP pulses.  Capillary refill is diminished.  Foot not cold.  Bedside ultrasound with pulsatile flow to left DP, right PT pulse but no clear flow through the right DP.  Suspect likely vascular wound.  Doubt acute limb ischemia as this has been going on for over a month now.  He has been referred to vascular surgery as an outpatient.  Given worsening pain, will obtain CT angiography of the aorta and bifemoral vessels to evaluate for acute process.  Likely discuss with vascular surgery and will obtain lactic acid.  Will treat pain.  Clinical Course as of 04/24/23 1533  Fri Apr 24, 2023  1355 Discussed with Dr. Chestine Spore.  Vascular surgery will consult given suspicion for chronic limb ischemia [WS]  1416 Dr. Chestine Spore recommends admission for angiogram Monday, patient having intractable pain. Doesn't need heparin [WS]  1450 Discussed with Dr. Chipper Herb who will admit the patient [WS]    Clinical Course User Index [WS] Lonell Grandchild, MD     Additional history obtained:  -External records from outside source obtained and reviewed including: Chart review including previous notes, labs, imaging, [facility]    Lab Tests: -I ordered, reviewed, and interpreted labs.   The pertinent results include:   Labs Reviewed  COMPREHENSIVE METABOLIC PANEL - Abnormal; Notable for the following components:      Result Value   Sodium 128 (*)    Chloride 97 (*)    CO2 20 (*)    Glucose, Bld 215 (*)    BUN 28 (*)    Creatinine, Ser 1.63 (*)    Total Protein 6.3 (*)    GFR, Estimated 47 (*)    All other components within normal limits  CBC WITH DIFFERENTIAL/PLATELET - Abnormal; Notable for the following components:   HCT 38.5 (*)    MCV 77.9 (*)    All other components within normal limits  C-REACTIVE PROTEIN - Abnormal; Notable for the following components:   CRP 1.2 (*)    All other components within normal limits  CBG MONITORING, ED - Abnormal; Notable for the following components:   Glucose-Capillary 190 (*)    All other components within normal limits  LACTIC ACID, PLASMA  SEDIMENTATION RATE  LACTIC ACID, PLASMA  CANCER ANTIGEN 19-9    Notable for mild hyponatremia, CKD, normal ESR/CRP  EKG   EKG Interpretation  Date/Time:  Friday Apr 24 2023 08:49:07 EDT Ventricular Rate:  59 PR Interval:  233 QRS Duration: 87 QT Interval:  403 QTC Calculation: 400 R Axis:   73 Text Interpretation: Sinus rhythm Prolonged PR interval Probable anteroseptal infarct, old Confirmed by Alvino Blood ([id number]) on 04/24/2023 8:51:02 AM         Imaging Studies ordered: I ordered imaging  studies including CTA bifem + aorta  On my interpretation imaging demonstrates calcification, difficult to tell for sure any acute ischemic process given this I independently visualized and interpreted imaging. I agree with the radiologist interpretation   Medicines ordered and prescription drug management: Meds ordered this encounter  Medications   sodium chloride 0.9 % bolus 1,000 mL   HYDROmorphone (DILAUDID) injection 0.5 mg   iohexol (OMNIPAQUE) 350 MG/ML injection 100 mL   HYDROmorphone (DILAUDID) injection 0.5 mg  acetaminophen (TYLENOL) tablet 1,000 mg   HYDROmorphone (DILAUDID) injection 0.5 mg   cefTRIAXone (ROCEPHIN) 2 g in sodium chloride 0.9 % 100 mL IVPB    Order Specific Question:   Antibiotic Indication:    Answer:   Osteomyelitis   metroNIDAZOLE (FLAGYL) IVPB 500 mg    Order Specific Question:   Antibiotic Indication:    Answer:   Other Indication (list below)    -I have reviewed the patients home medicines and have made adjustments as needed   Consultations Obtained: I requested consultation with the vascular surgeon,  and discussed lab and imaging findings as well as pertinent plan - they recommend: admit to medicine, aortogram Monday, no need for heparin   Cardiac Monitoring: The patient was maintained on a cardiac monitor.  I personally viewed and interpreted the cardiac monitored which showed an underlying rhythm of: NSR  Social Determinants of Health:  Diagnosis or treatment significantly limited by social determinants of health: history of drug abuse   Reevaluation: After the interventions noted above, I reevaluated the patient and found that their symptoms have improved  Co morbidities that complicate the patient evaluation  Past Medical History:  Diagnosis Date   Alcohol abuse    CAD (coronary artery disease)    a. Reported MI in 2012 s/p 2 stents;  b. 08/2012 Cath: LM 20, LAD 20 diff ISR, jailed septal - 99%, LCX 30ost, RI large, min irregs,  RCA 30p, 20-30 ISR-->Med Rx; c. 2017 [facility]   CKD (chronic kidney disease), stage III (HCC)    Cocaine abuse (HCC)    COPD (chronic obstructive pulmonary disease) (HCC)    Depression    Diabetes mellitus without complication (HCC)    Hyperlipidemia    Hypertension    Hypertensive urgency 02/11/2021   Noncompliance       Dispostion: Disposition decision including need for hospitalization was considered, and patient admitted to the hospital.    Final Clinical Impression(s) / ED Diagnoses Final diagnoses:  Critical limb ischemia of right lower extremity (HCC)     This chart was dictated using voice recognition software.  Despite best efforts to proofread,  errors can occur which can change the documentation meaning.    Lonell Grandchild, MD 04/24/23 [phone]

## 2023-04-24 NOTE — ED Triage Notes (Signed)
Pt. Stated, I bought  a new  pair of shoes that rubbed the top of toes 2nd and 3rd . I was using a spray called Aspercreme. My toes have turned black. Theres no numbness.  Pt's toe feels like its scaly. Good pedal pulse.The 2nd and 3rd toe is black.

## 2023-04-24 NOTE — Progress Notes (Signed)
ABI study completed.   Please see CV Procedures for preliminary results.  Lorenna Lurry, RVT  3:29 PM 04/24/23

## 2023-04-24 NOTE — H&P (Signed)
History and Physical    Jose [name]:[id number] DOB: 29-Aug-1961 DOA: 04/24/2023  PCP: [name] (Confirm with patient/family/NH records and if not entered, this has to be entered at [facility] point of entry) Patient coming from: Home  I have personally briefly reviewed patient's old medical records in [facility]  Chief Complaint: Right foot pain  HPI: Mishon Junior [name] is a 62 y.o. male with medical history significant of CAD status post stenting x 2, CKD stage II, COPD, IDDM, HTN, came with worsening of right foot pain.  Patient denies any history of claudication.  His symptoms started 4-5 weeks ago, initially he said that his no tenderness to scratched and caused blisters on the top side of  right-sided second and third toe, gradually he started to see discoloration of the second and third toes along with severe pain.  He went to see his PCP at [facility], started him on oxycodone.  Despite taking oxycodone 3 times a day, patient has had unbearable pain and significant limited his ambulation.  The pain has been worsening in the evening and night, which significantly affected his sleeping.  Denies any fever or chills.  He also complained about feeling " discomfort on the epigastric area since last week" Curnes will be a did a abdominal ultrasound last week and patient was told no significant findings to explain his symptoms.  He does not smoke, he drinks only occasionally.  No family history of cancer  ED Course: Afebrile, nontachycardic nonhypotensive nonhypoxic.  CTA showed diffuse atherosclerotic calcification of arteries and lower extremities, moderate to severe focal stenosis of the right popliteal artery.  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  AST ALT bilirubin within normal limits.  Creatinine 1.6 about his baseline, glucose 215 sodium 128 compared to baseline 126-132.  Patient was given IV narcotics, and complained about frequent  breakthrough pains and unable to ambulate.  Vascular surgeon evaluated patient in ED and recommend angiogram on Monday.  ED also gave patient 1 time dose of cefepime and Flagyl  Review of Systems: As per HPI otherwise 14 point review of systems negative.    Past Medical History:  Diagnosis Date   Alcohol abuse    CAD (coronary artery disease)    a. Reported MI in 2012 s/p 2 stents;  b. 08/2012 Cath: LM 20, LAD 20 diff ISR, jailed septal - 99%, LCX 30ost, RI large, min irregs, RCA 30p, 20-30 ISR-->Med Rx; c. 2017 Pt reports Neg [facility] test @ [facility].   CKD (chronic kidney disease), stage III (HCC)    Cocaine abuse (HCC)    COPD (chronic obstructive pulmonary disease) (HCC)    Depression    Diabetes mellitus without complication (HCC)    Hyperlipidemia    Hypertension    Hypertensive urgency 02/11/2021   Noncompliance     Past Surgical History:  Procedure Laterality Date   CARDIAC CATHETERIZATION     CORONARY STENT PLACEMENT     LEFT HEART CATH AND CORONARY ANGIOGRAPHY N/A 08/22/2020   Procedure: LEFT HEART CATH AND CORONARY ANGIOGRAPHY;  Surgeon: Lyn Records, MD;  Location: [facility] INVASIVE CV LAB;  Service: Cardiovascular;  Laterality: N/A;   LEFT HEART CATH AND CORONARY ANGIOGRAPHY N/A 01/28/2022   Procedure: LEFT HEART CATH AND CORONARY ANGIOGRAPHY;  Surgeon: Lennette Bihari, MD;  Location: [facility] INVASIVE CV LAB;  Service: Cardiovascular;  Laterality: N/A;   LEFT HEART CATHETERIZATION WITH CORONARY ANGIOGRAM Bilateral 08/25/2012   Procedure: LEFT  HEART CATHETERIZATION WITH CORONARY ANGIOGRAM;  Surgeon: Kathleene Hazel, MD;  Location: [facility] CATH LAB;  Service: Cardiovascular;  Laterality: Bilateral;     reports that he quit smoking about 15 years ago. His smoking use included cigarettes. He has never used smokeless tobacco. He reports current alcohol use. He reports current drug use. Drug: Marijuana.  Allergies  Allergen Reactions   Lisinopril-Hydrochlorothiazide Swelling    Causes  swelling of lips   Simvastatin Other (See Comments)    ALT (SGPT) level raised, Aspartate aminotransferase serum level raised    Family History  Problem Relation Age of Onset   Heart disease Mother        MI 60s     Prior to Admission medications   Medication Sig Start Date End Date Taking? Authorizing Provider  carvedilol (COREG) 6.25 MG tablet Take 1 tablet (6.25 mg total) by mouth 2 (two) times daily with a meal. 01/29/22  Yes Marjie Skiff E, PA-C  clopidogrel (PLAVIX) 75 MG tablet Take 1 tablet (75 mg total) by mouth daily with breakfast. 01/30/22  Yes Marjie Skiff E, PA-C  albuterol (PROVENTIL HFA;VENTOLIN HFA) 108 (90 BASE) MCG/ACT inhaler Inhale 2 puffs into the lungs every 6 (six) hours as needed for wheezing or shortness of breath.     [provider]  amLODipine (NORVASC) 10 MG tablet Take 1 tablet (10 mg total) by mouth daily. 03/12/21   [name], Deloris , MD  aspirin EC 81 MG tablet Take 81 mg by mouth daily. Swallow whole.    [provider]  atorvastatin (LIPITOR) 80 MG tablet Take 1 tablet (80 mg total) by mouth daily. 01/30/22   Marjie Skiff E, PA-C  brimonidine (ALPHAGAN) 0.2 % ophthalmic solution Place 1 drop into both eyes 2 (two) times daily.  04/11/15   [provider]  divalproex (DEPAKOTE ER) 250 MG 24 hr tablet Take 250 mg by mouth at bedtime. Patient not taking: Reported on 10/10/2022 08/07/22   [provider]  doxazosin (CARDURA) 8 MG tablet Take 4 mg by mouth at bedtime. 04/15/20   [provider]  fluticasone-salmeterol (ADVAIR) 500-50 MCG/ACT AEPB Inhale 1 puff into the lungs in the morning and at bedtime. 03/15/22   [provider]  furosemide (LASIX) 20 MG tablet Take 1 tablet by mouth every other day. 09/21/20   [provider]  glucose 4 GM chewable tablet Chew 1 tablet (4 g total) by mouth as needed for low blood sugar (Blood sugars less than 70). 08/11/22   Bing Neighbors, NP  hydrALAZINE  (APRESOLINE) 100 MG tablet Take 1 tablet (100 mg total) by mouth 3 (three) times daily. Patient taking differently: Take 100 mg by mouth 2 (two) times daily. 03/12/21   [name], Deloris , MD  hydrOXYzine (ATARAX) 25 MG tablet Take 1 tablet (25 mg total) by mouth 3 (three) times daily as needed for anxiety. 08/11/22   Bing Neighbors, NP  insulin glargine-yfgn (SEMGLEE) 100 UNIT/ML injection Inject 0.3 mLs (30 Units total) into the skin daily with breakfast. 08/12/22   Bing Neighbors, NP  latanoprost (XALATAN) 0.005 % ophthalmic solution Place 1 drop into both eyes at bedtime.  04/11/15   [provider]  magnesium oxide (MAG-OX) 400 (240 Mg) MG tablet Take 1 tablet (400 mg total) by mouth daily. 08/12/22   Bing Neighbors, NP  Multiple Vitamin (MULTIVITAMIN WITH MINERALS) TABS tablet Take 1 tablet by mouth daily.    [provider]  nitroGLYCERIN (NITROSTAT) 0.4 MG  SL tablet Place 1 tablet (0.4 mg total) under the tongue every 5 (five) minutes as needed for chest pain. 01/29/22   Corrin Parker, PA-C  Omega-3 Fatty Acids (FISH OIL) 1000 MG CAPS Take 1,000 mg by mouth daily.    [provider]  tadalafil (CIALIS) 10 MG tablet Take 1 tablet (10 mg total) by mouth daily as needed for erectile dysfunction. 10/21/22   Sharlene Dory, PA-C  traZODone (DESYREL) 150 MG tablet Take 75 mg by mouth at bedtime.    [provider]    Physical Exam: Vitals:   04/24/23 1315 04/24/23 1330 04/24/23 1445 04/24/23 1530  BP: (!) 149/80 (!) 143/79 (!) 168/85 (!) 171/84  Pulse: (!) 59 (!) 58 60 63  Resp: 13 12 18 14   Temp:      TempSrc:      SpO2: 100% 99% 99% 98%  Weight:      Height:        Constitutional: NAD, calm, comfortable Vitals:   04/24/23 1315 04/24/23 1330 04/24/23 1445 04/24/23 1530  BP: (!) 149/80 (!) 143/79 (!) 168/85 (!) 171/84  Pulse: (!) 59 (!) 58 60 63  Resp: 13 12 18 14   Temp:      TempSrc:      SpO2: 100% 99% 99% 98%  Weight:      Height:        Eyes: PERRL, lids and conjunctivae normal ENMT: Mucous membranes are moist. Posterior pharynx clear of any exudate or lesions.Normal dentition.  Neck: normal, supple, no masses, no thyromegaly Respiratory: clear to auscultation bilaterally, no wheezing, no crackles. Normal respiratory effort. No accessory muscle use.  Cardiovascular: Regular rate and rhythm, no murmurs / rubs / gallops. No extremity edema. 2+ pedal pulses. No carotid bruits.  Abdomen: no tenderness, no masses palpated. No hepatosplenomegaly. Bowel sounds positive.  Musculoskeletal: no clubbing / cyanosis. No joint deformity upper and lower extremities. Good ROM, no contractures. Normal muscle tone.  Skin: Dry gangrene like changes on right-sided second and third toes Neurologic: CN 2-12 grossly intact. Sensation intact, DTR normal. Strength 5/5 in all 4.  Psychiatric: Normal judgment and insight. Alert and oriented x 3. Normal mood.       Labs on Admission: I have personally reviewed following labs and imaging studies  CBC: Recent Labs  Lab 04/24/23 0842  WBC 6.7  NEUTROABS 3.7  HGB 13.6  HCT 38.5*  MCV 77.9*  PLT 178   Basic Metabolic Panel: Recent Labs  Lab 04/24/23 0842  NA 128*  K 4.0  CL 97*  CO2 20*  GLUCOSE 215*  BUN 28*  CREATININE 1.63*  CALCIUM 9.0   GFR: Estimated Creatinine Clearance: 48.8 mL/min (A) (by C-G formula based on SCr of 1.63 mg/dL (H)). Liver Function Tests: Recent Labs  Lab 04/24/23 0842  AST 28  ALT 23  ALKPHOS 71  BILITOT 0.4  PROT 6.3*  ALBUMIN 3.8   No results for input(s): "LIPASE", "AMYLASE" in the last 168 hours. No results for input(s): "AMMONIA" in the last 168 hours. Coagulation Profile: No results for input(s): "INR", "PROTIME" in the last 168 hours. Cardiac Enzymes: No results for input(s): "CKTOTAL", "CKMB", "CKMBINDEX", "TROPONINI" in the last 168 hours. BNP (last 3 results) No results for input(s): "PROBNP" in the last 8760 hours. HbA1C: No  results for input(s): "HGBA1C" in the last 72 hours. CBG: Recent Labs  Lab 04/24/23 0823  GLUCAP 190*   Lipid Profile: No results for input(s): "CHOL", "HDL", "LDLCALC", "TRIG", "CHOLHDL", "  LDLDIRECT" in the last 72 hours. Thyroid Function Tests: No results for input(s): "TSH", "T4TOTAL", "FREET4", "T3FREE", "THYROIDAB" in the last 72 hours. Anemia Panel: No results for input(s): "VITAMINB12", "FOLATE", "FERRITIN", "TIBC", "IRON", "RETICCTPCT" in the last 72 hours. Urine analysis:    Component Value Date/Time   COLORURINE YELLOW 04/22/2022 1127   APPEARANCEUR CLEAR 04/22/2022 1127   LABSPEC 1.017 04/22/2022 1127   PHURINE 5.0 04/22/2022 1127   GLUCOSEU 150 (A) 04/22/2022 1127   HGBUR NEGATIVE 04/22/2022 1127   BILIRUBINUR NEGATIVE 04/22/2022 1127   KETONESUR NEGATIVE 04/22/2022 1127   PROTEINUR 30 (A) 04/22/2022 1127   UROBILINOGEN 1.0 07/23/2015 1403   NITRITE NEGATIVE 04/22/2022 1127   LEUKOCYTESUR NEGATIVE 04/22/2022 1127    Radiological Exams on Admission: CT Angio Aortobifemoral W and/or Wo Contrast  Result Date: 04/24/2023 CLINICAL DATA:  Claudication or leg ischemia. Toes turned black after wearing new shoes. EXAM: CT ANGIOGRAPHY OF ABDOMINAL AORTA WITH ILIOFEMORAL RUNOFF TECHNIQUE: Multidetector CT imaging of the abdomen, pelvis and lower extremities was performed using the standard protocol during bolus administration of intravenous contrast. Multiplanar CT image reconstructions and MIPs were obtained to evaluate the vascular anatomy. RADIATION DOSE REDUCTION: This exam was performed according to the departmental dose-optimization program which includes automated exposure control, adjustment of the mA and/or kV according to patient size and/or use of iterative reconstruction technique. CONTRAST:  OMNIPAQUE IOHEXOL 350 MG/ML SOLN COMPARISON:  CT abdomen pelvis 04/27/2019 FINDINGS: VASCULAR Aorta: Mild calcified atheromatous plaque without aneurysm or flow-limiting  stenosis. Celiac: Patent without evidence of aneurysm, dissection, vasculitis or significant stenosis. SMA: Patent without evidence of aneurysm, dissection, vasculitis or significant stenosis. Renals: Both renal arteries are patent without evidence of aneurysm, dissection, vasculitis, fibromuscular dysplasia or significant stenosis. IMA: Patent without evidence of aneurysm, dissection, vasculitis or significant stenosis. RIGHT Lower Extremity Inflow: Common, internal and external iliac arteries are patent without evidence of aneurysm, dissection, vasculitis or significant stenosis. Outflow: Minimal calcified and noncalcified atheromatous plaque of the right common femoral artery without significant stenosis. Superficial femoral and profunda femoris arteries are diffusely calcified without significant narrowing. Moderate to severe focal stenosis of the right popliteal artery best seen on image 444 of series 5. Runoff: Lower leg arteries are diffusely calcified which limits the ability to determine if they are opacified. The peroneal artery appears patent. Anterior and posterior tibial arteries are likely occluded. LEFT Lower Extremity Inflow: Common, internal and external iliac arteries are patent without evidence of aneurysm, dissection, vasculitis or significant stenosis. Outflow: No significant stenosis of the common femoral artery. Superficial femoral, profundus femoris, and popliteal arteries are diffusely calcified but patent without significant stenosis. Runoff: Lower leg arteries are diffusely calcified which limits evaluation for opacification. The peroneal artery is patent to the level of the ankle. Unable to definitively determine if anterior and posterior tibial arteries are patent are occluded. Veins: No obvious venous abnormality within the limitations of this arterial phase study. Review of the MIP images confirms the above findings. NON-VASCULAR Lower chest: No acute abnormality. Extensive coronary  artery calcifications. Hepatobiliary: No focal liver abnormality is seen. No gallstones, gallbladder wall thickening, or biliary dilatation. Pancreas: 3.5 x 2.3 cm pancreatic tail mass (image 75, series 5) requires further evaluation with pancreatic mass protocol CT or MRI. No pancreatic duct dilatation. Spleen: Normal in size without focal abnormality. Adrenals/Urinary Tract: Adrenal glands are normal. 3 mm nonobstructing calculus present in the lower pole of the right kidney. Stomach/Bowel: Stomach is within normal limits. Appendix appears normal. No evidence  of bowel wall thickening, distention, or inflammatory changes. Lymphatic: No enlarged abdominal or pelvic lymph nodes. Reproductive: Prostate is unremarkable. Other: No abdominal wall hernia or abnormality. No abdominopelvic ascites. Musculoskeletal: Posttraumatic changes of the right mid tibia and fibula consistent with prior healed fractures. Right 10 mm loose body seen in the left knee joint. Tibial intramedullary rod is intact. Deformity of proximal left tibia and fibula consistent with remote healed fractures. IMPRESSION: 1. Extensive diffuse atherosclerotic calcification of the lower leg arteries. 2. 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 3. Lower leg arteries are diffusely calcified which limits evaluation for opacification. There is at least single-vessel runoff to both ankles through the peroneal arteries. Unable to definitively determine if anterior and posterior tibial arteries are opacified. 4. 3.5 x 2.3 cm pancreatic tail mass requires further evaluation with nonemergent pancreatic mass protocol CT or MRI. 5. 3 mm nonobstructing calculus in the lower pole of the right kidney. Electronically Signed   By: Acquanetta Belling M.D.   On: 04/24/2023 13:00   DG Foot Complete Right  Result Date: 04/24/2023 CLINICAL DATA:  Right foot pain for 1 month. EXAM: RIGHT FOOT COMPLETE - 3+ VIEW COMPARISON:  None Available. FINDINGS: There  is no evidence of acute fracture or dislocation. There is no evidence of arthropathy. Vascular calcifications are noted. Status post intramedullary rod fixation of right tibia. IMPRESSION: No acute abnormality seen. Electronically Signed   By: Lupita Raider M.D.   On: 04/24/2023 09:20    EKG: Independently reviewed.  Sinus rhythm, first-degree AV block, no acute ST changes.  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 (For example, if patient is on BP meds at home and you resume or decide to hold them, it is a problem that needs to be her. Same for CAD, COPD, HLD and so on)  Dry gangrene of right sided 2nd and 3rd toe -Subacute, vascular surgeon consultation appreciated.  Agreed that no indication for systemic anticoagulation at this point.  Clinically and on physical exam, no symptoms signs of active infection right second and third toe, will hold off antibiotics. -Pain control, plan for alternative dilaudid and PO Oxycodone -Angiogram on Monday -Overall risk of losing the right 2rd and 3rd toes is high, patient made aware. -ABI pending, no significant PVD Hx and denies any history of claudication. -Continue aspirin Plavix and statin  Incidental finding of pancreatic tail mass -Suspect pancreatic cancer.  [id number] sent. -Discussed with patient regarding the incidental CT finding, patient agreed with outpatient follow-up with contrast VOA for outpatient MRCP.  His next clinic visit be June 11. -Consider Trouseau syndrome for his ischemic toes  CKD stage II -Euvolemic, creatinine level stable, may consider start IV fluid on Sunday for 24 hours for kidney  protection  Acute on chronic hyponatremia -Euvolemic, sodium level stable -Fluid restriction.  IDDM with hyperglycemia -Continue Lantus 30 units daily -Add sliding scale -Add Januvia  HTN, refractory -Continue amlodipine and Coreg, hydralazine -Euvolemic, will hold off Lasix  COPD -Stable, continue Ellipta  CAD -No acute concerns -ASA, Plavix and statin   DVT prophylaxis: Heparin subQ Code Status: Full code Family Communication: None at  bedside Disposition Plan: Patient is sick with subacute right 2 toes ischemia failed outpatient management, requiring inpatient vascular consultation and inpatient angiogram.  Expect more than 2 midnight hospital stay. Consults called: Vascular surgery Admission status: Medsurg admit   Emeline General MD [facility] Pager [phone]  04/24/2023, 4:07 PM

## 2023-04-24 NOTE — H&P (View-Only) (Signed)
Hospital Consult    Reason for Consult:  Right foot wounds Referring Physician:  ED MRN #:  [id number]  History of Present Illness: This is a Jose [name] with hx CAD, CKD stage III, HTN, HLD, DM, COPD that vascular surgery has been consulted for right foot wounds.  Patient states he got some new shoes that rubbed against his foot and blistered his right foot about 2-3 weeks ago.  He then started putting Aspercreme on the foot.  He states his pain is 10 out of 10.  No previous vascular interventions.  States [facility] but has not [facility] anybody.  He is ambulatory.  Lives here in [place].  Past Medical History:  Diagnosis Date   Alcohol abuse    CAD (coronary artery disease)    a. Reported MI in 2012 s/p 2 stents;  b. 08/2012 Cath: LM 20, LAD 20 diff ISR, jailed septal - 99%, LCX 30ost, RI large, min irregs, RCA 30p, 20-30 ISR-->Med Rx; c. 2017 [facility].   CKD (chronic kidney disease), stage III (HCC)    Cocaine abuse (HCC)    COPD (chronic obstructive pulmonary disease) (HCC)    Depression    Diabetes mellitus without complication (HCC)    Hyperlipidemia    Hypertension    Hypertensive urgency 02/11/2021   Noncompliance     Past Surgical History:  Procedure Laterality Date   CARDIAC CATHETERIZATION     CORONARY STENT PLACEMENT     LEFT HEART CATH AND CORONARY ANGIOGRAPHY N/A 08/22/2020   Procedure: LEFT HEART CATH AND CORONARY ANGIOGRAPHY;  Surgeon: Smith, Henry W, MD;  Location: [facility];  Service: Cardiovascular;  Laterality: N/A;   LEFT HEART CATH AND CORONARY ANGIOGRAPHY N/A 01/28/2022   Procedure: LEFT HEART CATH AND CORONARY ANGIOGRAPHY;  Surgeon: Kelly, Thomas A, MD;  Location: [facility];  Service: Cardiovascular;  Laterality: N/A;   LEFT HEART CATHETERIZATION WITH CORONARY ANGIOGRAM Bilateral 08/25/2012   Procedure: LEFT HEART CATHETERIZATION WITH CORONARY ANGIOGRAM;  Surgeon: Lannis Lichtenwalner D  McAlhany, MD;  Location: [facility];  Service: Cardiovascular;  Laterality: Bilateral;    Allergies  Allergen Reactions   Lisinopril-Hydrochlorothiazide Swelling    Causes swelling of lips   Simvastatin Other (See Comments)    ALT (SGPT) level raised, Aspartate aminotransferase serum level raised    Prior to Admission medications   Medication Sig Start Date End Date Taking? Authorizing Provider  albuterol (PROVENTIL HFA;VENTOLIN HFA) 108 (90 BASE) MCG/ACT inhaler Inhale 2 puffs into the lungs every 6 (six) hours as needed for wheezing or shortness of breath.     [provider]  amLODipine (NORVASC) 10 MG tablet Take 1 tablet (10 mg total) by mouth daily. 03/12/21   [name], Philip J, MD  aspirin EC 81 MG tablet Take 81 mg by mouth daily. Swallow whole.    [provider]  atorvastatin (LIPITOR) 80 MG tablet Take 1 tablet (80 mg total) by mouth daily. 01/30/22   Goodrich, Callie E, PA-C  brimonidine (ALPHAGAN) 0.2 % ophthalmic solution Place 1 drop into both eyes 2 (two) times daily.  04/11/15   [provider]  carvedilol (COREG) 6.25 MG tablet Take 1 tablet (6.25 mg total) by mouth 2 (two) times daily with a meal. 01/29/22   Goodrich, Callie E, PA-C  clopidogrel (PLAVIX) 75 MG tablet Take 1 tablet (75 mg total) by mouth daily with breakfast. 01/30/22   Goodrich, Callie   E, PA-C  divalproex (DEPAKOTE ER) 250 MG 24 hr tablet Take 250 mg by mouth at bedtime. Patient not taking: Reported on 10/10/2022 08/07/22   [provider]  doxazosin (CARDURA) 8 MG tablet Take 4 mg by mouth at bedtime. 04/15/20   [provider]  fluticasone-salmeterol (ADVAIR) 500-50 MCG/ACT AEPB Inhale 1 puff into the lungs in the morning and at bedtime. 03/15/22   [provider]  furosemide (LASIX) 20 MG tablet Take 1 tablet by mouth every other day. 09/21/20   [provider]  glucose 4 GM chewable tablet Chew 1 tablet (4 g total) by mouth as needed for low blood  sugar (Blood sugars less than 70). 08/11/22   Harris, Kimberly S, NP  hydrALAZINE (APRESOLINE) 100 MG tablet Take 1 tablet (100 mg total) by mouth 3 (three) times daily. Patient taking differently: Take 100 mg by mouth 2 (two) times daily. 03/12/21   [name], Philip J, MD  hydrOXYzine (ATARAX) 25 MG tablet Take 1 tablet (25 mg total) by mouth 3 (three) times daily as needed for anxiety. 08/11/22   Harris, Kimberly S, NP  insulin glargine-yfgn (SEMGLEE) 100 UNIT/ML injection Inject 0.3 mLs (30 Units total) into the skin daily with breakfast. 08/12/22   Harris, Kimberly S, NP  latanoprost (XALATAN) 0.005 % ophthalmic solution Place 1 drop into both eyes at bedtime.  04/11/15   [provider]  magnesium oxide (MAG-OX) 400 (240 Mg) MG tablet Take 1 tablet (400 mg total) by mouth daily. 08/12/22   Harris, Kimberly S, NP  Multiple Vitamin (MULTIVITAMIN WITH MINERALS) TABS tablet Take 1 tablet by mouth daily.    [provider]  nitroGLYCERIN (NITROSTAT) 0.4 MG SL tablet Place 1 tablet (0.4 mg total) under the tongue every 5 (five) minutes as needed for chest pain. 01/29/22   Goodrich, Callie E, PA-C  Omega-3 Fatty Acids (FISH OIL) 1000 MG CAPS Take 1,000 mg by mouth daily.    [provider]  tadalafil (CIALIS) 10 MG tablet Take 1 tablet (10 mg total) by mouth daily as needed for erectile dysfunction. 10/21/22   Conte, Tessa N, PA-C  traZODone (DESYREL) 150 MG tablet Take 75 mg by mouth at bedtime.    [provider]    Social History   Socioeconomic History   Marital status: Married    Spouse name: Not on file   Number of children: Not on file   Years of education: Not on file   Highest education level: Not on file  Occupational History   Not on file  Tobacco Use   Smoking status: Former    Types: Cigarettes    Quit date: 11/25/2007    Years since quitting: 15.4   Smokeless tobacco: Never  Vaping Use   Vaping Use: Never used  Substance and Sexual Activity    Alcohol use: Yes    Comment: Used to drink heavily - says currently 2 beers/day.   Drug use: Yes    Types: Marijuana    Comment: reports not using cocaine   Sexual activity: Yes    Birth control/protection: None  Other Topics Concern   Not on file  Social History Narrative   Lives in [place] by himself.  Does not work or routinely exercise.   Social Determinants of Health   Financial Resource Strain: Not on file  Food Insecurity: No Food Insecurity (07/25/2022)   Hunger Vital Sign    Worried About Running Out of Food in the Last Year: Never true      Ran Out of Food in the Last Year: Never true  Transportation Needs: No Transportation Needs (07/25/2022)   PRAPARE - Transportation    Lack of Transportation (Medical): No    Lack of Transportation (Non-Medical): No  Physical Activity: Not on file  Stress: Not on file  Social Connections: Not on file  Intimate Partner Violence: Not on file     Family History  Problem Relation Age of Onset   Heart disease Mother        MI 70s    ROS: [x] Positive   [ ] Negative   [ ] All sytems reviewed and are negative  Cardiovascular: [] chest pain/pressure [] palpitations [] SOB lying flat [] DOE [] pain in legs while walking [] pain in legs at rest [] pain in legs at night [] non-healing ulcers [] hx of DVT [] swelling in legs  Pulmonary: [] productive cough [] asthma/wheezing [] home O2  Neurologic: [] weakness in [] arms [] legs [] numbness in [] arms [] legs [] hx of CVA [] mini stroke []difficulty speaking or slurred speech [] temporary loss of vision in one eye [] dizziness  Hematologic: [] hx of cancer [] bleeding problems [] problems with blood clotting easily  Endocrine:   [] diabetes [] thyroid disease  GI [] vomiting blood [] blood in stool  GU: [] CKD/renal failure [] HD--[] M/W/F or [] T/T/S [] burning with urination [] blood in urine  Psychiatric: [] anxiety [] depression  Musculoskeletal: []  arthritis [] joint pain  Integumentary: [] rashes [] ulcers  Constitutional: [] fever [] chills   Physical Examination  Vitals:   04/24/23 1227 04/24/23 1315  BP:  (!) 149/80  Pulse:  (!) 59  Resp:  13  Temp: 98.2 F (36.8 C)   SpO2:  100%   Body mass index is 25.1 kg/m.  General:  WDWN in NAD Gait: Not observed HENT: WNL, normocephalic Pulmonary: normal non-labored breathing Cardiac: regular, without  Murmurs, rubs or gallops Abdomen:  soft, NT/ND, no masses Vascular Exam/Pulses: Palpable femoral pulses bilaterally Palpable popliteal pulses bilaterally Brisk right PT and peroneal signal Right 1st, 2nd 3rd toe ulcers as pictured Musculoskeletal: no muscle wasting or atrophy  Neurologic: A&O X 3; Appropriate Affect ; SENSATION: normal; MOTOR FUNCTION:  moving all extremities equally. Speech is fluent/normal        CBC    Component Value Date/Time   WBC 6.7 04/24/2023 0842   RBC 4.94 04/24/2023 0842   HGB 13.6 04/24/2023 0842   HCT 38.5 (L) 04/24/2023 0842   PLT 178 04/24/2023 0842   MCV 77.9 (L) 04/24/2023 0842   MCH 27.5 04/24/2023 0842   MCHC 35.3 04/24/2023 0842   RDW 13.6 04/24/2023 0842   LYMPHSABS 1.8 04/24/2023 0842   MONOABS 0.6 04/24/2023 0842   EOSABS 0.5 04/24/2023 0842   BASOSABS 0.0 04/24/2023 0842    BMET    Component Value Date/Time   NA 128 (L) 04/24/2023 0842   NA 131 (L) 12/14/2020 0849   K 4.0 04/24/2023 0842   CL 97 (L) 04/24/2023 0842   CO2 20 (L) 04/24/2023 0842   GLUCOSE 215 (H) 04/24/2023 0842   BUN 28 (H) 04/24/2023 0842   BUN 26 12/14/2020 0849   CREATININE 1.63 (H) 04/24/2023 0842   CALCIUM 9.0 04/24/2023 0842   GFRNONAA 47 (L) 04/24/2023 0842   GFRAA 53 (L) 12/14/2020 0849    COAGS: Lab Results  Component Value Date   INR   1.1 01/27/2022   INR 1.04 08/29/2017   INR 1.00 10/17/2015     Non-Invasive Vascular Imaging:    ABI's pending  ASSESSMENT/PLAN: This is a Jose [name] with hx CAD, CKD stage III,  HTN, HLD, DM, COPD that vascular surgery has been consulted for right foot wounds.  He is developing an eschar on his right first, second, and third toes where he rubbed a blister on the right foot with his shoe about 2-3 weeks ago.  On exam he actually has pretty brisk posterior tibial and peroneal Doppler signal.  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  He does have a palpable popliteal pulse on exam.  Discussed could plan angiogram next week.  He does not feel like he can tolerate his level of pain and I discussed this being done as an outpatient.  I have asked the ED to get the hospitalist involved for admission given his comorbidities.  Will put him on the angiogram scheduled for Monday in the Cath Lab.  I discussed details of surgery with the patient and that this is for limb salvage including risks and benefits.  I have recommended ABIs which I have ordered.  Continue aspirin statin Plavix.  Emoni Yang J. Everli Rother, MD [facility] Office: [phone]  Keylor Rands J Malon Siddall  

## 2023-04-24 NOTE — ED Notes (Signed)
3rd rn attempted IV, pt will require IV team. Order Placed

## 2023-04-24 NOTE — ED Notes (Signed)
IV infiltrated, pt will need a new Iv placed

## 2023-04-25 DIAGNOSIS — I2510 Atherosclerotic heart disease of native coronary artery without angina pectoris: Secondary | ICD-10-CM

## 2023-04-25 DIAGNOSIS — E118 Type 2 diabetes mellitus with unspecified complications: Secondary | ICD-10-CM | POA: Diagnosis not present

## 2023-04-25 DIAGNOSIS — N1832 Chronic kidney disease, stage 3b: Secondary | ICD-10-CM | POA: Diagnosis not present

## 2023-04-25 DIAGNOSIS — I771 Stricture of artery: Secondary | ICD-10-CM

## 2023-04-25 DIAGNOSIS — I10 Essential (primary) hypertension: Secondary | ICD-10-CM

## 2023-04-25 DIAGNOSIS — I96 Gangrene, not elsewhere classified: Secondary | ICD-10-CM | POA: Diagnosis not present

## 2023-04-25 DIAGNOSIS — Z794 Long term (current) use of insulin: Secondary | ICD-10-CM

## 2023-04-25 DIAGNOSIS — S91301A Unspecified open wound, right foot, initial encounter: Secondary | ICD-10-CM | POA: Diagnosis not present

## 2023-04-25 LAB — COMPREHENSIVE METABOLIC PANEL
ALT: 20 U/L (ref 0–44)
AST: 23 U/L (ref 15–41)
Albumin: 3.5 g/dL (ref 3.5–5.0)
Alkaline Phosphatase: 59 U/L (ref 38–126)
Anion gap: 7 (ref 5–15)
BUN: 31 mg/dL — ABNORMAL HIGH (ref 8–23)
CO2: 23 mmol/L (ref 22–32)
Calcium: 9.3 mg/dL (ref 8.9–10.3)
Chloride: 101 mmol/L (ref 98–111)
Creatinine, Ser: 2.06 mg/dL — ABNORMAL HIGH (ref 0.61–1.24)
GFR, Estimated: 35 mL/min — ABNORMAL LOW (ref >60)
Glucose, Bld: 198 mg/dL — ABNORMAL HIGH (ref 70–99)
Potassium: 3.8 mmol/L (ref 3.5–5.1)
Sodium: 131 mmol/L — ABNORMAL LOW (ref 135–145)
Total Bilirubin: 0.4 mg/dL (ref 0.3–1.2)
Total Protein: 5.8 g/dL — ABNORMAL LOW (ref 6.5–8.1)

## 2023-04-25 LAB — CBC WITH DIFFERENTIAL/PLATELET
Abs Immature Granulocytes: 0.04 10*3/uL (ref 0.00–0.07)
Basophils Absolute: 0 10*3/uL (ref 0.0–0.1)
Basophils Relative: 0 %
Eosinophils Absolute: 0.5 10*3/uL (ref 0.0–0.5)
Eosinophils Relative: 7 %
HCT: 36.9 % — ABNORMAL LOW (ref 39.0–52.0)
Hemoglobin: 13.4 g/dL (ref 13.0–17.0)
Immature Granulocytes: 1 %
Lymphocytes Relative: 32 %
Lymphs Abs: 2.3 10*3/uL (ref 0.7–4.0)
MCH: 27.9 pg (ref 26.0–34.0)
MCHC: 36.3 g/dL — ABNORMAL HIGH (ref 30.0–36.0)
MCV: 76.7 fL — ABNORMAL LOW (ref 80.0–100.0)
Monocytes Absolute: 0.7 10*3/uL (ref 0.1–1.0)
Monocytes Relative: 9 %
Neutro Abs: 3.7 10*3/uL (ref 1.7–7.7)
Neutrophils Relative %: 51 %
Platelets: 175 10*3/uL (ref 150–400)
RBC: 4.81 MIL/uL (ref 4.22–5.81)
RDW: 13.5 % (ref 11.5–15.5)
WBC: 7.2 10*3/uL (ref 4.0–10.5)
nRBC: 0 % (ref 0.0–0.2)

## 2023-04-25 LAB — MAGNESIUM: Magnesium: 1.5 mg/dL — ABNORMAL LOW (ref 1.7–2.4)

## 2023-04-25 LAB — PHOSPHORUS: Phosphorus: 3.1 mg/dL (ref 2.5–4.6)

## 2023-04-25 LAB — GLUCOSE, CAPILLARY
Glucose-Capillary: 246 mg/dL — ABNORMAL HIGH (ref 70–99)
Glucose-Capillary: 183 mg/dL — ABNORMAL HIGH (ref 70–99)
Glucose-Capillary: 98 mg/dL (ref 70–99)
Glucose-Capillary: 140 mg/dL — ABNORMAL HIGH (ref 70–99)

## 2023-04-25 LAB — VAS US ABI WITH/WO TBI

## 2023-04-25 MED ORDER — OXYCODONE HCL 5 MG PO TABS
5.0000 mg | ORAL_TABLET | Freq: Four times a day (QID) | ORAL | Status: DC | PRN
  Administered 2023-04-25: 10 mg via ORAL
  Filled 2023-04-25: qty 2

## 2023-04-25 MED ORDER — SENNOSIDES-DOCUSATE SODIUM 8.6-50 MG PO TABS
1.0000 | ORAL_TABLET | Freq: Two times a day (BID) | ORAL | Status: DC
  Administered 2023-04-25 – 2023-04-30 (×7): 1 via ORAL
  Filled 2023-04-25 (×10): qty 1

## 2023-04-25 MED ORDER — OXYCODONE HCL 5 MG PO TABS
5.0000 mg | ORAL_TABLET | ORAL | Status: DC | PRN
  Administered 2023-04-25 – 2023-04-26 (×6): 10 mg via ORAL
  Administered 2023-04-27: 5 mg via ORAL
  Administered 2023-04-27 – 2023-04-29 (×7): 10 mg via ORAL
  Filled 2023-04-25 (×13): qty 2

## 2023-04-25 MED ORDER — SODIUM CHLORIDE 0.9 % IV SOLN
INTRAVENOUS | Status: DC
  Administered 2023-04-26: 75 mL/h via INTRAVENOUS

## 2023-04-25 MED ORDER — POLYETHYLENE GLYCOL 3350 17 G PO PACK
17.0000 g | PACK | Freq: Two times a day (BID) | ORAL | Status: DC
  Administered 2023-04-25 – 2023-04-27 (×3): 17 g via ORAL
  Filled 2023-04-25 (×6): qty 1

## 2023-04-25 NOTE — Progress Notes (Signed)
PROGRESS NOTE    Jose [name]  ZOX:[id number] DOB: 1961/05/15 DOA: 04/24/2023 PCP: [name]   Brief Narrative:  HPI per Dr. Mikey College on 04/24/23 HPI: Jose [name] is a 62 y.o. male with medical history significant of CAD status post stenting x 2, CKD stage II, COPD, IDDM, HTN, came with worsening of right foot pain.   Patient denies any history of claudication.  His symptoms started 4-5 weeks ago, initially he said that his no tenderness to scratched and caused blisters on the top side of  right-sided second and third toe, gradually he started to see discoloration of the second and third toes along with severe pain.  He went to see his PCP at [facility], started him on oxycodone.  Despite taking oxycodone 3 times a day, patient has had unbearable pain and significant limited his ambulation.  The pain has been worsening in the evening and night, which significantly affected his sleeping.  Denies any fever or chills.  He also complained about feeling " discomfort on the epigastric area since last week" Curnes will be a did a abdominal ultrasound last week and patient was told no significant findings to explain his symptoms.  He does not smoke, he drinks only occasionally.  No family history of cancer   ED Course: Afebrile, nontachycardic nonhypotensive nonhypoxic.  CTA showed diffuse atherosclerotic calcification of arteries and lower extremities, moderate to severe focal stenosis of the right popliteal artery.  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  AST ALT bilirubin within normal limits.  Creatinine 1.6 about his baseline, glucose 215 sodium 128 compared to baseline 126-132.   Patient was given IV narcotics, and complained about frequent breakthrough pains and unable to ambulate.  Vascular surgeon evaluated patient in ED and recommend angiogram on Monday.  ED also gave patient 1 time dose of cefepime and Flagyl  **Interim History Pain was  uncontrolled so pain but just inside admitted.  Angiogram is being planned for Monday.  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  Agreed that no indication for systemic anticoagulation at this point.  Clinically and on physical exam, no symptoms signs of active infection right second and third toe, will hold off antibiotics. -Pain control, plan for alternative dilaudid and PO Oxycodone and adjustments made today for 5-10 mg q4hprn -Angiogram on Monday -Overall risk of losing the right 2rd and 3rd toes is high, patient made aware. -ESR was 5 and CRP was 1.2 -ABI Noncompressible, no significant PVD Hx and denies any history of claudication. -Continue aspirin Plavix and statin   Incidental finding of pancreatic tail mass -Suspect pancreatic cancer.   -CA19-9 sent. -Discussed with patient regarding the incidental CT finding, patient agreed with outpatient follow-up with contrast VOA for outpatient MRCP.   -His next clinic visit be June 11. -Consider Trouseau syndrome for his ischemic toes   AKI on CKD stage IIIb -Euvolemic, creatinine level now elevated -Baseline Cr appears to be around 1.5-1.7 -BUN/Cr Trend: Recent Labs  Lab 04/24/23 0842 04/25/23 1119  BUN 28* 31*  CREATININE 1.63* 2.06*  -Will start NS at 75 mL/hr -Avoid Nephrotoxic Medications, Contrast Dyes, Hypotension and Dehydration to Ensure Adequate Renal Perfusion and will need to Renally Adjust Meds -Continue to Monitor and Trend Renal Function carefully and repeat CMP in the AM   Hypomagnesemia -Patient's Mag  Level Trend: Recent Labs  Lab 04/25/23 1119  MG 1.5*  -Replete with IV Mag Sulfate 2 grams -Continue to Monitor and Replete as Necessary -Repeat Mag in the AM   Acute on chronic hyponatremia -Euvolemic, sodium level stable -Fluid restriction but will start NS given  worsened Renal Fxn -Na+ Trend: Recent Labs  Lab 04/24/23 0842 04/25/23 1119  NA 128* 131*    IDDM with Hyperglycemia -Continue Lantus 30 units daily -Add sliding scale -Last HbA1c was 7.6 in our system back in September 2023 -Add Januvia -CBG Trend:  Recent Labs  Lab 04/24/23 [phone] 04/24/23 1843 04/24/23 2001 04/25/23 0737 04/25/23 1154 04/25/23 1637  GLUCAP 190* 213* 220* 246* 183* 98    HTN, refractory -Continue amlodipine and Coreg, hydralazine -Euvolemic, will hold off Lasix -Continue to Monitor BP per Protocol   COPD -Stable, continue Ellipta SpO2: 98 % -Continue to Monitor Respiratory Status Carefully   CAD -No acute concerns -ASA, Plavix and statin  DVT prophylaxis: heparin injection 5,000 Units Start: 04/24/23 2200    Code Status: Full Code Family Communication: No family currently at bedside  Disposition Plan:  Level of care: Med-Surg Status is: Inpatient Remains inpatient appropriate because: His further clinical improvement and getting an angiogram on Monday   Consultants:  Vascular surgery  Procedures:  As delineated as above  Antimicrobials:  Anti-infectives (From admission, onward)    Start     Dose/Rate Route Frequency Ordered Stop   04/24/23 1500  cefTRIAXone (ROCEPHIN) 2 g in sodium chloride 0.9 % 100 mL IVPB        2 g 200 mL/hr over 30 Minutes Intravenous  Once 04/24/23 1450 04/24/23 1952   04/24/23 1500  metroNIDAZOLE (FLAGYL) IVPB 500 mg        500 mg 100 mL/hr over 60 Minutes Intravenous  Once 04/24/23 1450 04/25/23 0817       Subjective: Seen and examined at bedside and states that he is having excruciating pain in his feet and states that he tried lidocaine on his foot and then the blister popped.  He denies any nausea or vomiting.  Feels okay.  No other concerns or complaints at this time.  Objective: Vitals:   04/24/23 2015 04/25/23 0000 04/25/23 0400 04/25/23 0821  BP: 139/87 102/70 121/82 130/67  Pulse: 68   (!) 59   Resp: 20 20 19 18   Temp: 98.5 F (36.9 C) 97.8 F (36.6 C)  97.9 F (36.6 C)  TempSrc: Oral Oral  Oral  SpO2: 96% 97% 98% 98%  Weight:      Height:        Intake/Output Summary (Last 24 hours) at 04/25/2023 2000 Last data filed at 04/25/2023 1759 Gross per 24 hour  Intake 1080 ml  Output 100 ml  Net 980 ml   Filed Weights   04/24/23 0824  Weight: 81.6 kg   Examination: Physical Exam:  Constitutional: WN/WD overweight African-American male in no acute distress Respiratory: Diminished to auscultation bilaterally, no wheezing, rales, rhonchi or crackles. Normal respiratory effort and patient is not tachypenic. No accessory muscle use.  Unlabored breathing Cardiovascular: RRR, no murmurs / rubs / gallops. S1 and S2 auscultated. No extremity edema. Abdomen: Soft, non-tender, distended secondary to body habitus. Bowel sounds positive.  GU: Deferred. Musculoskeletal: Has gangrenous toes on his right foot Skin: No rashes noted on limited skin evaluation. No induration; Warm and dry.  Neurologic: CN 2-12 grossly intact with no focal deficits.  Romberg sign and cerebellar reflexes not assessed.  Psychiatric: Normal judgment and insight. Alert and oriented x 3. Normal mood and appropriate affect.   Data Reviewed: I have personally reviewed following labs and imaging studies  CBC: Recent Labs  Lab 04/24/23 0842 04/25/23 1119  WBC 6.7 7.2  NEUTROABS 3.7 3.7  HGB 13.6 13.4  HCT 38.5* 36.9*  MCV 77.9* 76.7*  PLT 178 175   Basic Metabolic Panel: Recent Labs  Lab 04/24/23 0842 04/25/23 1119  NA 128* 131*  K 4.0 3.8  CL 97* 101  CO2 20* 23  GLUCOSE 215* 198*  BUN 28* 31*  CREATININE 1.63* 2.06*  CALCIUM 9.0 9.3  MG  --  1.5*  PHOS  --  3.1   GFR: Estimated Creatinine Clearance: 38.6 mL/min (A) (by C-G formula based on SCr of 2.06 mg/dL (H)). Liver Function Tests: Recent Labs  Lab 04/24/23 0842 04/25/23 1119  AST 28 23  ALT 23 20  ALKPHOS 71 59  BILITOT 0.4 0.4   PROT 6.3* 5.8*  ALBUMIN 3.8 3.5   No results for input(s): "LIPASE", "AMYLASE" in the last 168 hours. No results for input(s): "AMMONIA" in the last 168 hours. Coagulation Profile: No results for input(s): "INR", "PROTIME" in the last 168 hours. Cardiac Enzymes: No results for input(s): "CKTOTAL", "CKMB", "CKMBINDEX", "TROPONINI" in the last 168 hours. BNP (last 3 results) No results for input(s): "PROBNP" in the last 8760 hours. HbA1C: No results for input(s): "HGBA1C" in the last 72 hours. CBG: Recent Labs  Lab 04/24/23 1843 04/24/23 2001 04/25/23 0737 04/25/23 1154 04/25/23 1637  GLUCAP 213* 220* 246* 183* 98   Lipid Profile: No results for input(s): "CHOL", "HDL", "LDLCALC", "TRIG", "CHOLHDL", "LDLDIRECT" in the last 72 hours. Thyroid Function Tests: No results for input(s): "TSH", "T4TOTAL", "FREET4", "T3FREE", "THYROIDAB" in the last 72 hours. Anemia Panel: No results for input(s): "VITAMINB12", "FOLATE", "FERRITIN", "TIBC", "IRON", "RETICCTPCT" in the last 72 hours. Sepsis Labs: Recent Labs  Lab 04/24/23 0840 04/24/23 2002  LATICACIDVEN 1.0 0.7    No results found for this or any previous visit (from the past 240 hour(s)).   Radiology Studies: VAS [place] ABI WITH/WO TBI  Result Date: 04/25/2023  LOWER EXTREMITY DOPPLER STUDY Patient Name:  Ahyan Junior [name]  Date of Exam:   04/24/2023 Medical Rec #: [id number]           Accession #:    [id number] Date of Birth: 1960-11-25           Patient Gender: M Patient Age:   42 years Exam Location:  [facility] Procedure:      VAS [place] ABI WITH/WO TBI Referring Phys: Sherald Hess --------------------------------------------------------------------------------  Indications: Peripheral artery disease. High Risk         Hypertension, hyperlipidemia, Diabetes, coronary artery Factors:          disease.  Comparison Study: No previous study. Performing Technologist: McKayla Maag RVT, [place]  Examination Guidelines: A complete  evaluation includes at minimum, Doppler waveform signals and systolic blood pressure reading at the level of bilateral brachial, anterior tibial, and posterior tibial arteries, when vessel segments are accessible. Bilateral testing is considered an integral part of a complete examination. Photoelectric Plethysmograph (PPG) waveforms and toe systolic pressure readings are included as required and additional duplex testing as needed. Limited examinations for reoccurring indications may be performed as noted.  ABI Findings: +---------+------------------+-----+------------------+------------------------+ Right    Rt Pressure (mmHg)IndexWaveform          Comment                  +---------+------------------+-----+------------------+------------------------+  Brachial 199                    biphasic                                   +---------+------------------+-----+------------------+------------------------+ PTA      255               1.28 monophasic        Non-compressible         +---------+------------------+-----+------------------+------------------------+ DP                              dampened          Obtained with 2D imaging                                 monophasic                                 +---------+------------------+-----+------------------+------------------------+ Great Toe                       Absent                                     +---------+------------------+-----+------------------+------------------------+ +---------+------------------+-----+----------+--------------------------------+ Left     Lt Pressure (mmHg)IndexWaveform  Comment                          +---------+------------------+-----+----------+--------------------------------+ Brachial                        biphasic  No pressure obtained due to IV                                             placement.                        +---------+------------------+-----+----------+--------------------------------+ PTA      255               1.28 monophasicNon-compressible                 +---------+------------------+-----+----------+--------------------------------+ DP       255               1.28 monophasicNon-compressible                 +---------+------------------+-----+----------+--------------------------------+ Great Toe73                0.37 Abnormal                                   +---------+------------------+-----+----------+--------------------------------+ +-------+----------------+-----------+------------+------------+ ABI/TBIToday's ABI     Today's TBIPrevious ABIPrevious TBI +-------+----------------+-----------+------------+------------+ Right  Non-compressibe Absent                              +-------+----------------+-----------+------------+------------+ Left   Non-compressible0.37                                +-------+----------------+-----------+------------+------------+  Summary: Right: Resting right ankle-brachial index indicates noncompressible right lower extremity arteries. The right toe-brachial index is abnormal. Left: Resting left ankle-brachial index indicates noncompressible left lower extremity arteries. The left toe-brachial index is abnormal. *See table(s) above for measurements and observations.  Electronically signed by Sherald Hess MD on 04/25/2023 at 9:37:19 AM.    Final    CT Angio Aortobifemoral W and/or Wo Contrast  Result Date: 04/24/2023 CLINICAL DATA:  Claudication or leg ischemia. Toes turned black after wearing new shoes. EXAM: CT ANGIOGRAPHY OF ABDOMINAL AORTA WITH ILIOFEMORAL RUNOFF TECHNIQUE: Multidetector CT imaging of the abdomen, pelvis and lower extremities was performed using the standard protocol during bolus administration of intravenous contrast. Multiplanar CT image reconstructions and MIPs were obtained to evaluate the vascular  anatomy. RADIATION DOSE REDUCTION: This exam was performed according to the departmental dose-optimization program which includes automated exposure control, adjustment of the mA and/or kV according to patient size and/or use of iterative reconstruction technique. CONTRAST:  OMNIPAQUE IOHEXOL 350 MG/ML SOLN COMPARISON:  CT abdomen pelvis 04/27/2019 FINDINGS: VASCULAR Aorta: Mild calcified atheromatous plaque without aneurysm or flow-limiting stenosis. Celiac: Patent without evidence of aneurysm, dissection, vasculitis or significant stenosis. SMA: Patent without evidence of aneurysm, dissection, vasculitis or significant stenosis. Renals: Both renal arteries are patent without evidence of aneurysm, dissection, vasculitis, fibromuscular dysplasia or significant stenosis. IMA: Patent without evidence of aneurysm, dissection, vasculitis or significant stenosis. RIGHT Lower Extremity Inflow: Common, internal and external iliac arteries are patent without evidence of aneurysm, dissection, vasculitis or significant stenosis. Outflow: Minimal calcified and noncalcified atheromatous plaque of the right common femoral artery without significant stenosis. Superficial femoral and profunda femoris arteries are diffusely calcified without significant narrowing. Moderate to severe focal stenosis of the right popliteal artery best seen on image 444 of series 5. Runoff: Lower leg arteries are diffusely calcified which limits the ability to determine if they are opacified. The peroneal artery appears patent. Anterior and posterior tibial arteries are likely occluded. LEFT Lower Extremity Inflow: Common, internal and external iliac arteries are patent without evidence of aneurysm, dissection, vasculitis or significant stenosis. Outflow: No significant stenosis of the common femoral artery. Superficial femoral, profundus femoris, and popliteal arteries are diffusely calcified but patent without significant stenosis. Runoff: Lower  leg arteries are diffusely calcified which limits evaluation for opacification. The peroneal artery is patent to the level of the ankle. Unable to definitively determine if anterior and posterior tibial arteries are patent are occluded. Veins: No obvious venous abnormality within the limitations of this arterial phase study. Review of the MIP images confirms the above findings. NON-VASCULAR Lower chest: No acute abnormality. Extensive coronary artery calcifications. Hepatobiliary: No focal liver abnormality is seen. No gallstones, gallbladder wall thickening, or biliary dilatation. Pancreas: 3.5 x 2.3 cm pancreatic tail mass (image 75, series 5) requires further evaluation with pancreatic mass protocol CT or MRI. No pancreatic duct dilatation. Spleen: Normal in size without focal abnormality. Adrenals/Urinary Tract: Adrenal glands are normal. 3 mm nonobstructing calculus present in the lower pole of the right kidney. Stomach/Bowel: Stomach is within normal limits. Appendix appears normal. No evidence of bowel wall thickening, distention, or inflammatory changes. Lymphatic: No enlarged abdominal or pelvic lymph nodes. Reproductive: Prostate is unremarkable. Other: No abdominal wall hernia or abnormality. No abdominopelvic ascites. Musculoskeletal: Posttraumatic changes of the right mid tibia and fibula consistent with prior healed fractures. Right 10 mm loose body seen in the left knee joint. Tibial intramedullary rod is intact. Deformity of proximal left tibia and fibula  consistent with remote healed fractures. IMPRESSION: 1. Extensive diffuse atherosclerotic calcification of the lower leg arteries. 2. 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 3. Lower leg arteries are diffusely calcified which limits evaluation for opacification. There is at least single-vessel runoff to both ankles through the peroneal arteries. Unable to definitively determine if anterior and posterior tibial arteries are  opacified. 4. 3.5 x 2.3 cm pancreatic tail mass requires further evaluation with nonemergent pancreatic mass protocol CT or MRI. 5. 3 mm nonobstructing calculus in the lower pole of the right kidney. Electronically Signed   By: Acquanetta Belling M.D.   On: 04/24/2023 13:00   DG Foot Complete Right  Result Date: 04/24/2023 CLINICAL DATA:  Right foot pain for 1 month. EXAM: RIGHT FOOT COMPLETE - 3+ VIEW COMPARISON:  None Available. FINDINGS: There is no evidence of acute fracture or dislocation. There is no evidence of arthropathy. Vascular calcifications are noted. Status post intramedullary rod fixation of right tibia. IMPRESSION: No acute abnormality seen. Electronically Signed   By: Lupita Raider M.D.   On: 04/24/2023 09:20    Scheduled Meds:  amLODipine  10 mg Oral Daily   aspirin EC  81 mg Oral Daily   atorvastatin  80 mg Oral Daily   brimonidine  1 drop Both Eyes TID   carvedilol  12.5 mg Oral BID WC   clopidogrel  75 mg Oral Q breakfast   heparin  5,000 Units Subcutaneous Q12H   hydrALAZINE  100 mg Oral BID   insulin aspart  0-9 Units Subcutaneous TID WC   insulin glargine-yfgn  25 Units Subcutaneous Q breakfast   isosorbide mononitrate  30 mg Oral Daily   latanoprost  1 drop Both Eyes QHS   linagliptin  5 mg Oral Daily   magnesium oxide  400 mg Oral Daily   mometasone-formoterol  2 puff Inhalation BID   polyethylene glycol  17 g Oral BID   senna-docusate  1 tablet Oral BID   traZODone  75 mg Oral QHS   Continuous Infusions:   LOS: 1 day   Marguerita Merles, DO [facility] Available via Epic secure chat 7am-7pm After these hours, please refer to coverage provider listed on [email or web address] 04/25/2023, 8:00 PM

## 2023-04-25 NOTE — Progress Notes (Signed)
[facility]  Subjective  - states his right foot still hurts   Objective 130/67 (!) 59 97.9 F (36.6 C) (Oral) 18 98%  Intake/Output Summary (Last 24 hours) at 04/25/2023 0933 Last data filed at 04/25/2023 2130 Gross per 24 hour  Intake 600 ml  Output 100 ml  Net 500 ml         Laboratory Lab Results: Recent Labs    04/24/23 0842  WBC 6.7  HGB 13.6  HCT 38.5*  PLT 178   BMET Recent Labs    04/24/23 0842  NA 128*  K 4.0  CL 97*  CO2 20*  GLUCOSE 215*  BUN 28*  CREATININE 1.63*  CALCIUM 9.0    COAG Lab Results  Component Value Date   INR 1.1 01/27/2022   INR 1.04 08/29/2017   INR 1.00 10/17/2015   No results found for: "PTT"  Assessment/Planning:  62 year old male with multiple comorbidities seen yesterday in the ED with tissue loss in the right lower extremity worsening over the past 3 weeks.  He has a palpable popliteal pulse but I cannot palpate any pedal pulses.  ABIs were noncompressible.  Suspect he has tibial disease after review of CTA obtained by the ED.  Plan angiogram with possible intervention with a focus on the right leg on Monday with Dr. Randie Heinz in the Cath Lab.  I discussed risk benefits and indications with the patient.  Appreciate hospitalist assistance for management of his comorbidities.  Cephus Shelling 04/25/2023 9:33 AM --

## 2023-04-26 DIAGNOSIS — E118 Type 2 diabetes mellitus with unspecified complications: Secondary | ICD-10-CM | POA: Diagnosis not present

## 2023-04-26 DIAGNOSIS — S91301A Unspecified open wound, right foot, initial encounter: Secondary | ICD-10-CM | POA: Diagnosis not present

## 2023-04-26 DIAGNOSIS — I96 Gangrene, not elsewhere classified: Secondary | ICD-10-CM | POA: Diagnosis not present

## 2023-04-26 DIAGNOSIS — N1832 Chronic kidney disease, stage 3b: Secondary | ICD-10-CM | POA: Diagnosis not present

## 2023-04-26 DIAGNOSIS — I2510 Atherosclerotic heart disease of native coronary artery without angina pectoris: Secondary | ICD-10-CM | POA: Diagnosis not present

## 2023-04-26 LAB — CBC WITH DIFFERENTIAL/PLATELET
Abs Immature Granulocytes: 0.02 10*3/uL (ref 0.00–0.07)
Basophils Absolute: 0 10*3/uL (ref 0.0–0.1)
Basophils Relative: 0 %
Eosinophils Absolute: 0.5 10*3/uL (ref 0.0–0.5)
Eosinophils Relative: 6 %
HCT: 34.9 % — ABNORMAL LOW (ref 39.0–52.0)
Hemoglobin: 12.6 g/dL — ABNORMAL LOW (ref 13.0–17.0)
Immature Granulocytes: 0 %
Lymphocytes Relative: 36 %
Lymphs Abs: 2.8 10*3/uL (ref 0.7–4.0)
MCH: 27.6 pg (ref 26.0–34.0)
MCHC: 36.1 g/dL — ABNORMAL HIGH (ref 30.0–36.0)
MCV: 76.4 fL — ABNORMAL LOW (ref 80.0–100.0)
Monocytes Absolute: 0.7 10*3/uL (ref 0.1–1.0)
Monocytes Relative: 9 %
Neutro Abs: 3.8 10*3/uL (ref 1.7–7.7)
Neutrophils Relative %: 49 %
Platelets: 169 10*3/uL (ref 150–400)
RBC: 4.57 MIL/uL (ref 4.22–5.81)
RDW: 13.4 % (ref 11.5–15.5)
WBC: 7.7 10*3/uL (ref 4.0–10.5)
nRBC: 0 % (ref 0.0–0.2)

## 2023-04-26 LAB — COMPREHENSIVE METABOLIC PANEL
ALT: 20 U/L (ref 0–44)
AST: 21 U/L (ref 15–41)
Albumin: 3.4 g/dL — ABNORMAL LOW (ref 3.5–5.0)
Alkaline Phosphatase: 57 U/L (ref 38–126)
Anion gap: 8 (ref 5–15)
BUN: 36 mg/dL — ABNORMAL HIGH (ref 8–23)
CO2: 22 mmol/L (ref 22–32)
Calcium: 9.2 mg/dL (ref 8.9–10.3)
Chloride: 100 mmol/L (ref 98–111)
Creatinine, Ser: 2 mg/dL — ABNORMAL HIGH (ref 0.61–1.24)
GFR, Estimated: 37 mL/min — ABNORMAL LOW (ref >60)
Glucose, Bld: 129 mg/dL — ABNORMAL HIGH (ref 70–99)
Potassium: 3.7 mmol/L (ref 3.5–5.1)
Sodium: 130 mmol/L — ABNORMAL LOW (ref 135–145)
Total Bilirubin: 0.5 mg/dL (ref 0.3–1.2)
Total Protein: 5.8 g/dL — ABNORMAL LOW (ref 6.5–8.1)

## 2023-04-26 LAB — GLUCOSE, CAPILLARY
Glucose-Capillary: 80 mg/dL (ref 70–99)
Glucose-Capillary: 162 mg/dL — ABNORMAL HIGH (ref 70–99)
Glucose-Capillary: 126 mg/dL — ABNORMAL HIGH (ref 70–99)
Glucose-Capillary: 166 mg/dL — ABNORMAL HIGH (ref 70–99)

## 2023-04-26 LAB — MAGNESIUM: Magnesium: 1.5 mg/dL — ABNORMAL LOW (ref 1.7–2.4)

## 2023-04-26 LAB — PHOSPHORUS: Phosphorus: 3.9 mg/dL (ref 2.5–4.6)

## 2023-04-26 MED ORDER — MAGNESIUM SULFATE 2 GM/50ML IV SOLN
2.0000 g | Freq: Once | INTRAVENOUS | Status: AC
  Administered 2023-04-26: 2 g via INTRAVENOUS
  Filled 2023-04-26: qty 50

## 2023-04-26 NOTE — Hospital Course (Signed)
HPI per Dr. Mikey College on 04/24/23 HPI: Jose [name] is a 62 y.o. male with medical history significant of CAD status post stenting x 2, CKD stage II, COPD, IDDM, HTN, came with worsening of right foot pain.   Patient denies any history of claudication.  His symptoms started 4-5 weeks ago, initially he said that his no tenderness to scratched and caused blisters on the top side of  right-sided second and third toe, gradually he started to see discoloration of the second and third toes along with severe pain.  He went to see his PCP at [facility], started him on oxycodone.  Despite taking oxycodone 3 times a day, patient has had unbearable pain and significant limited his ambulation.  The pain has been worsening in the evening and night, which significantly affected his sleeping.  Denies any fever or chills.  He also complained about feeling " discomfort on the epigastric area since last week" Curnes will be a did a abdominal ultrasound last week and patient was told no significant findings to explain his symptoms.  He does not smoke, he drinks only occasionally.  No family history of cancer   ED Course: Afebrile, nontachycardic nonhypotensive nonhypoxic.  CTA showed diffuse atherosclerotic calcification of arteries and lower extremities, moderate to severe focal stenosis of the right popliteal artery.  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  AST ALT bilirubin within normal limits.  Creatinine 1.6 about his baseline, glucose 215 sodium 128 compared to baseline 126-132.   Patient was given IV narcotics, and complained about frequent breakthrough pains and unable to ambulate.  Vascular surgeon evaluated patient in ED and recommend angiogram on Monday.  ED also gave patient 1 time dose of cefepime and Flagyl   **Interim History Pain was uncontrolled so pain but just inside admitted.  Angiogram was done Monday and he is status post a 6 x 6 mm shockwave lithotripsy of the  left popliteal artery and popliteal angioplasty and has peroneal runoff multiphasic is postoperative day 1.  Continued to have some pain so we will anticipate discharging in next 24 to 48 hours if pain is controlled and if he has had a bowel movement and evaluated by physical therapy and Occupational Therapy.   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  Agreed that no indication for systemic anticoagulation at this point.  Clinically and on physical exam, no symptoms signs of active infection right second and third toe, will hold off antibiotics. -Pain control, plan for alternative dilaudid and PO Oxycodone and adjustments made today for 5-10 mg q4hprn -Overall risk of losing the right 2rd and 3rd toes is high, patient made aware. -ESR was 5 and CRP was 1.2 -ABI Noncompressible, no significant PVD Hx and denies any history of claudication. -Continue aspirin Plavix and statin -Patient underwent angiogram and is status post a 6 x 6 mm shockwave lithotripsy of the left popliteal artery and popliteal angioplasty and has peroneal runoff multiphasic and postoperative day 1. -Vascular feels that his stable from their standpoint the patient will complain about pain still.  Felt weak so PT OT consulted and they are recommending no follow-up.  Anticipating discharging home in next 24 to 48 hours if pain is controlled and if doing well otherwise. -Lipid panel was done and he has a total cholesterol/HDL ratio 2.4, cholesterol 95, HDL 40, LDL 38,  triglycerides of 84, VLDL 17 -Pulm assist was consulted to optimize lipid-lowering therapy for secondary prevention of clinical ASCVD   Incidental finding of Pancreatic Tail Mass -Suspect pancreatic cancer.   -CA19-9 was 13. -Discussed with patient regarding the incidental CT finding, patient agreed with outpatient follow-up with contrast VOA  for outpatient MRCP.   -His next clinic visit be June 11. -Consider Trouseau syndrome for his ischemic toes   AKI on CKD stage IIIb Metabolic Acidosis -Euvolemic, creatinine level now elevated -Baseline Cr appears to be around 1.5-1.7 -BUN/Cr Trend: Recent Labs  Lab 04/24/23 0842 04/25/23 1119 04/26/23 0218 04/27/23 0940 04/28/23 0511  BUN 28* 31* 36* 23 27*  CREATININE 1.63* 2.06* 2.00* 1.59* 1.71*  -Will continue NS at 75 mL/hr but increased to 100 mL/hr by Vascular Surgery in plans for Angiogram and continued but now will stop today  -Has slight metabolic Acidosis with a due to of 19, anion gap of 11, chloride level of 99 -Avoid Nephrotoxic Medications, Contrast Dyes, Hypotension and Dehydration to Ensure Adequate Renal Perfusion and will need to Renally Adjust Meds -Continue to Monitor and Trend Renal Function carefully and repeat CMP in the AM    Hypomagnesemia -Patient's Mag Level Trend: Recent Labs  Lab 04/25/23 1119 04/26/23 0218 04/27/23 0940 04/28/23 0511  MG 1.5* 1.5* 1.8 2.3  -Replete with IV Mag Sulfate 2 grams again -Continue to Monitor and Replete as Necessary -Repeat Mag in the AM    Acute on Chronic Hyponatremia -Euvolemic, sodium level stable -Fluid restriction was initiated but Vascular started the patient on NS at 100 mL/hr which we will now stop -Na+ Trend: Recent Labs  Lab 04/24/23 0842 04/25/23 1119 04/26/23 0218 04/27/23 0940 04/28/23 0511  NA 128* 131* 130* 134* 129*   IDDM with Hyperglycemia -Continue Lantus 30 units daily -Add sliding scale -Last HbA1c was 7.6 in our system back in September 2023 and repeat was 7.7 -Add Januvia -CBG Trend:  Recent Labs  Lab 04/27/23 1143 04/27/23 1608 04/27/23 1737 04/27/23 2105 04/28/23 0743 04/28/23 1233 04/28/23 1644  GLUCAP 78 92 90 190* 119* 180* 182*    HTN, refractory -Continue amlodipine and Coreg, hydralazine -Euvolemic, will hold off Lasix -Continue to Monitor BP per  Protocol -Last BP reading was 144/76   COPD -Stable, continue Mometasone-Formoterol 200-5 mcg/ACT 2 puff IH BID and Albuterol 3 mL IH q6hprn Wheezing and SOB SpO2: 94 % O2 Flow Rate (L/min): 2 L/min -Continue to Monitor Respiratory Status Carefully  Constipation -Initiate Bowel Regimen continue senna docusate 1 tab p.o. twice daily, MiraLAX 17 g p.o. twice daily and bisacodyl 10 mg rectal suppository daily moderate constipation   CAD -No acute concerns -ASA 81 mg po Daily, Clopidogrel 75 mg po Daily and Atorvastatin 80 mg po Daily   Hypoalbuminemia -Patient's Albumin Trend: Recent Labs  Lab 04/24/23 0842 04/25/23 1119 04/26/23 0218 04/27/23 0940 04/28/23 0511  ALBUMIN 3.8 3.5 3.4* 3.4* 3.4*  -Continue to Monitor and Trend and repeat CMP in the AM  Glaucoma -C/w Brimonidine 1 drop Both Eyes TID and Latanoprost 1 drop Both Eyes qHS

## 2023-04-26 NOTE — Progress Notes (Signed)
[facility]  Subjective  - states his right foot still hurts   Objective 138/76 60 98.2 F (36.8 C) (Oral) 17 99%  Intake/Output Summary (Last 24 hours) at 04/26/2023 1122 Last data filed at 04/26/2023 1015 Gross per 24 hour  Intake 1137.79 ml  Output 975 ml  Net 162.79 ml         Laboratory Lab Results: Recent Labs    04/25/23 1119 04/26/23 0218  WBC 7.2 7.7  HGB 13.4 12.6*  HCT 36.9* 34.9*  PLT 175 169   BMET Recent Labs    04/25/23 1119 04/26/23 0218  NA 131* 130*  K 3.8 3.7  CL 101 100  CO2 23 22  GLUCOSE 198* 129*  BUN 31* 36*  CREATININE 2.06* 2.00*  CALCIUM 9.3 9.2    COAG Lab Results  Component Value Date   INR 1.1 01/27/2022   INR 1.04 08/29/2017   INR 1.00 10/17/2015   No results found for: "PTT"  Assessment/Planning:  62 year old male with multiple comorbidities seen Friday in the ED with tissue loss in the right lower extremity worsening over the past 3 weeks.  He has a palpable popliteal pulse but I cannot palpate any pedal pulses.  ABIs were noncompressible.  Suspect he has tibial disease after review of CTA obtained by the ED.  Plan angiogram with possible intervention with a focus on the right leg tomorrow with Dr. Randie Heinz in the Cath Lab.  I discussed risk benefits and indications with the patient.  Appreciate hospitalist assistance for management of his comorbidities.  Please keep NPO after midnight.  Consent order placed in chart.  Jose [name] 04/26/2023 11:22 AM --

## 2023-04-26 NOTE — Plan of Care (Signed)
  Problem: Coping: Goal: Ability to adjust to condition or change in health will improve Outcome: Progressing   Problem: Metabolic: Goal: Ability to maintain appropriate glucose levels will improve Outcome: Progressing   Problem: Nutritional: Goal: Maintenance of adequate nutrition will improve Outcome: Progressing   Problem: Skin Integrity: Goal: Risk for impaired skin integrity will decrease Outcome: Progressing   

## 2023-04-26 NOTE — Progress Notes (Signed)
PROGRESS NOTE    Jose [name]  ZOX:[id number] DOB: Aug 19, 1961 DOA: 04/24/2023 PCP: [name]   Brief Narrative:  HPI per Dr. Mikey College on 04/24/23 HPI: Jose [name] is a 62 y.o. male with medical history significant of CAD status post stenting x 2, CKD stage II, COPD, IDDM, HTN, came with worsening of right foot pain.   Patient denies any history of claudication.  His symptoms started 4-5 weeks ago, initially he said that his no tenderness to scratched and caused blisters on the top side of  right-sided second and third toe, gradually he started to see discoloration of the second and third toes along with severe pain.  He went to see his PCP at [facility], started him on oxycodone.  Despite taking oxycodone 3 times a day, patient has had unbearable pain and significant limited his ambulation.  The pain has been worsening in the evening and night, which significantly affected his sleeping.  Denies any fever or chills.  He also complained about feeling " discomfort on the epigastric area since last week" Curnes will be a did a abdominal ultrasound last week and patient was told no significant findings to explain his symptoms.  He does not smoke, he drinks only occasionally.  No family history of cancer   ED Course: Afebrile, nontachycardic nonhypotensive nonhypoxic.  CTA showed diffuse atherosclerotic calcification of arteries and lower extremities, moderate to severe focal stenosis of the right popliteal artery.  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  AST ALT bilirubin within normal limits.  Creatinine 1.6 about his baseline, glucose 215 sodium 128 compared to baseline 126-132.   Patient was given IV narcotics, and complained about frequent breakthrough pains and unable to ambulate.  Vascular surgeon evaluated patient in ED and recommend angiogram on Monday.  ED also gave patient 1 time dose of cefepime and Flagyl   **Interim History Pain was  uncontrolled so pain but just inside admitted.  Angiogram is being planned for Monday.  Will start IV fluids given slightly elevated renal function and continue current plan of care.    Assessment and Plan:   Dry gangrene and tissue loss of right sided 2nd and 3rd toe  -Subacute, vascular surgeon consultation appreciated.  Agreed that no indication for systemic anticoagulation at this point.  Clinically and on physical exam, no symptoms signs of active infection right second and third toe, will hold off antibiotics. -Pain control, plan for alternative dilaudid and PO Oxycodone and adjustments made today for 5-10 mg q4hprn -Overall risk of losing the right 2rd and 3rd toes is high, patient made aware. -ESR was 5 and CRP was 1.2 -ABI Noncompressible, no significant PVD Hx and denies any history of claudication. -Continue aspirin Plavix and statin -Plan is for angiogram with possible intervention with focus on the right lower extremity tomorrow with Dr. Randie Heinz in the Cath Lab and patient is to be n.p.o. after midnight   Incidental finding of pancreatic tail mass -Suspect pancreatic cancer.   -CA19-9 sent. -Discussed with patient regarding the incidental CT finding, patient agreed with outpatient follow-up with contrast VOA for outpatient MRCP.   -His next clinic visit be June 11. -Consider Trouseau syndrome for his ischemic toes   AKI on CKD stage IIIb -Euvolemic, creatinine level now elevated -Baseline Cr appears to be around 1.5-1.7 -BUN/Cr Trend: Recent Labs  Lab 04/24/23 0842 04/25/23 1119 04/26/23 0218  BUN 28* 31* 36*  CREATININE 1.63* 2.06* 2.00*  -Will  continue NS at 75 mL/hr -Avoid Nephrotoxic Medications, Contrast Dyes, Hypotension and Dehydration to Ensure Adequate Renal Perfusion and will need to Renally Adjust Meds -Continue to Monitor and Trend Renal Function carefully and repeat CMP in the AM    Hypomagnesemia -Patient's Mag Level Trend: Recent Labs  Lab  04/25/23 1119 04/26/23 0218  MG 1.5* 1.5*  -Replete with IV Mag Sulfate 2 grams again -Continue to Monitor and Replete as Necessary -Repeat Mag in the AM    Acute on chronic hyponatremia -Euvolemic, sodium level stable -Fluid restriction but will start NS given worsened Renal Fxn -Na+ Trend: Recent Labs  Lab 04/24/23 0842 04/25/23 1119 04/26/23 0218  NA 128* 131* 130*   IDDM with Hyperglycemia -Continue Lantus 30 units daily -Add sliding scale -Last HbA1c was 7.6 in our system back in September 2023 -Add Januvia -CBG Trend:  Recent Labs  Lab 04/25/23 0737 04/25/23 1154 04/25/23 1637 04/25/23 2015 04/26/23 0723 04/26/23 1130 04/26/23 1623  GLUCAP 246* 183* 98 140* 80 162* 126*    HTN, refractory -Continue amlodipine and Coreg, hydralazine -Euvolemic, will hold off Lasix -Continue to Monitor BP per Protocol -Last BP reading was 116/71   COPD -Stable, continue Ellipta SpO2: 98 % -Continue to Monitor Respiratory Status Carefully   CAD -No acute concerns -ASA 81 mg po Daily, Clopidogrel 75 mg po Daily and Atorvastatin 80 mg po Daily     DVT prophylaxis: heparin injection 5,000 Units Start: 04/24/23 2200    Code Status: Full Code Family Communication: No Family currently at bedside  Disposition Plan:  Level of care: Med-Surg Status is: Inpatient Remains inpatient appropriate because: Is getting a angiogram tomorrow   Consultants:  Vascular surgery  Procedures:  As delineated as above  Antimicrobials:  Anti-infectives (From admission, onward)    Start     Dose/Rate Route Frequency Ordered Stop   04/24/23 1500  cefTRIAXone (ROCEPHIN) 2 g in sodium chloride 0.9 % 100 mL IVPB        2 g 200 mL/hr over 30 Minutes Intravenous  Once 04/24/23 1450 04/24/23 1952   04/24/23 1500  metroNIDAZOLE (FLAGYL) IVPB 500 mg        500 mg 100 mL/hr over 60 Minutes Intravenous  Once 04/24/23 1450 04/25/23 0817       Subjective: Seen and examined at bedside and he  was doing okay.  States that the pain in his foot is not as bad.  No nausea or vomiting.  Denies any lightheadedness or dizziness.  Asking how long he will be hungry for.  No other concerns or complaints at this time.  Objective: Vitals:   04/25/23 0821 04/25/23 2011 04/26/23 0724 04/26/23 1436  BP: 130/67 110/70 138/76 116/71  Pulse: (!) 59 (!) 57 60 60  Resp: 18  17 18   Temp: 97.9 F (36.6 C) 98.1 F (36.7 C) 98.2 F (36.8 C) 98.2 F (36.8 C)  TempSrc: Oral Oral Oral Oral  SpO2: 98% 99% 99% 99%  Weight:      Height:        Intake/Output Summary (Last 24 hours) at 04/26/2023 1808 Last data filed at 04/26/2023 1719 Gross per 24 hour  Intake 657.79 ml  Output 1425 ml  Net -767.21 ml   Filed Weights   04/24/23 0824  Weight: 81.6 kg   Examination: Physical Exam:  Constitutional: WN/WD mildly obese African-American male in no acute distress Respiratory: Diminished to auscultation bilaterally, no wheezing, rales, rhonchi or crackles. Normal respiratory effort and patient is not  tachypenic. No accessory muscle use.  Unlabored breathing Cardiovascular: RRR, no murmurs / rubs / gallops. S1 and S2 auscultated. No extremity edema.  Abdomen: Soft, non-tender, mildly distended 2/2 body habitus. Bowel sounds positive.  GU: Deferred. Musculoskeletal: No clubbing / cyanosis of digits/nails. No joint deformity upper and lower extremities.  Skin: Has dry gangrene of 2nd and 3rd toes Neurologic: CN 2-12 grossly intact with no focal deficits. Sensation intact in all 4 Extremities, DTR normal. Strength 5/5 in all 4. Romberg sign cerebellar reflexes not assessed.  Psychiatric: Normal judgment and insight. Alert and oriented x 3. Normal mood and appropriate affect.   Data Reviewed: I have personally reviewed following labs and imaging studies  CBC: Recent Labs  Lab 04/24/23 0842 04/25/23 1119 04/26/23 0218  WBC 6.7 7.2 7.7  NEUTROABS 3.7 3.7 3.8  HGB 13.6 13.4 12.6*  HCT 38.5* 36.9* 34.9*   MCV 77.9* 76.7* 76.4*  PLT 178 175 169   Basic Metabolic Panel: Recent Labs  Lab 04/24/23 0842 04/25/23 1119 04/26/23 0218  NA 128* 131* 130*  K 4.0 3.8 3.7  CL 97* 101 100  CO2 20* 23 22  GLUCOSE 215* 198* 129*  BUN 28* 31* 36*  CREATININE 1.63* 2.06* 2.00*  CALCIUM 9.0 9.3 9.2  MG  --  1.5* 1.5*  PHOS  --  3.1 3.9   GFR: Estimated Creatinine Clearance: 39.7 mL/min (A) (by C-G formula based on SCr of 2 mg/dL (H)). Liver Function Tests: Recent Labs  Lab 04/24/23 0842 04/25/23 1119 04/26/23 0218  AST 28 23 21   ALT 23 20 20   ALKPHOS 71 59 57  BILITOT 0.4 0.4 0.5  PROT 6.3* 5.8* 5.8*  ALBUMIN 3.8 3.5 3.4*   No results for input(s): "LIPASE", "AMYLASE" in the last 168 hours. No results for input(s): "AMMONIA" in the last 168 hours. Coagulation Profile: No results for input(s): "INR", "PROTIME" in the last 168 hours. Cardiac Enzymes: No results for input(s): "CKTOTAL", "CKMB", "CKMBINDEX", "TROPONINI" in the last 168 hours. BNP (last 3 results) No results for input(s): "PROBNP" in the last 8760 hours. HbA1C: No results for input(s): "HGBA1C" in the last 72 hours. CBG: Recent Labs  Lab 04/25/23 1637 04/25/23 2015 04/26/23 0723 04/26/23 1130 04/26/23 1623  GLUCAP 98 140* 80 162* 126*   Lipid Profile: No results for input(s): "CHOL", "HDL", "LDLCALC", "TRIG", "CHOLHDL", "LDLDIRECT" in the last 72 hours. Thyroid Function Tests: No results for input(s): "TSH", "T4TOTAL", "FREET4", "T3FREE", "THYROIDAB" in the last 72 hours. Anemia Panel: No results for input(s): "VITAMINB12", "FOLATE", "FERRITIN", "TIBC", "IRON", "RETICCTPCT" in the last 72 hours. Sepsis Labs: Recent Labs  Lab 04/24/23 0840 04/24/23 2002  LATICACIDVEN 1.0 0.7    No results found for this or any previous visit (from the past 240 hour(s)).   Radiology Studies: No results found.  Scheduled Meds:  amLODipine  10 mg Oral Daily   aspirin EC  81 mg Oral Daily   atorvastatin  80 mg Oral  Daily   brimonidine  1 drop Both Eyes TID   carvedilol  12.5 mg Oral BID WC   clopidogrel  75 mg Oral Q breakfast   heparin  5,000 Units Subcutaneous Q12H   hydrALAZINE  100 mg Oral BID   insulin aspart  0-9 Units Subcutaneous TID WC   insulin glargine-yfgn  25 Units Subcutaneous Q breakfast   isosorbide mononitrate  30 mg Oral Daily   latanoprost  1 drop Both Eyes QHS   linagliptin  5 mg Oral Daily  magnesium oxide  400 mg Oral Daily   mometasone-formoterol  2 puff Inhalation BID   polyethylene glycol  17 g Oral BID   senna-docusate  1 tablet Oral BID   traZODone  75 mg Oral QHS   Continuous Infusions:  sodium chloride 75 mL/hr (04/26/23 1042)    LOS: 2 days   Marguerita Merles, DO [facility] Available via Epic secure chat 7am-7pm After these hours, please refer to coverage provider listed on [email or web address] 04/26/2023, 6:08 PM

## 2023-04-27 ENCOUNTER — Encounter (HOSPITAL_COMMUNITY)
Admission: EM | Disposition: A | Discharge status: Home / Self Care | Payer: Self-pay | Source: Home / Self Care | Attending: Internal Medicine

## 2023-04-27 DIAGNOSIS — I70235 Atherosclerosis of native arteries of right leg with ulceration of other part of foot: Secondary | ICD-10-CM | POA: Diagnosis not present

## 2023-04-27 DIAGNOSIS — N1832 Chronic kidney disease, stage 3b: Secondary | ICD-10-CM | POA: Diagnosis not present

## 2023-04-27 DIAGNOSIS — I96 Gangrene, not elsewhere classified: Secondary | ICD-10-CM | POA: Diagnosis not present

## 2023-04-27 DIAGNOSIS — I2510 Atherosclerotic heart disease of native coronary artery without angina pectoris: Secondary | ICD-10-CM | POA: Diagnosis not present

## 2023-04-27 DIAGNOSIS — E118 Type 2 diabetes mellitus with unspecified complications: Secondary | ICD-10-CM | POA: Diagnosis not present

## 2023-04-27 HISTORY — PX: ABDOMINAL AORTOGRAM W/LOWER EXTREMITY: CATH118223

## 2023-04-27 HISTORY — PX: PERIPHERAL VASCULAR BALLOON ANGIOPLASTY: CATH118281

## 2023-04-27 HISTORY — PX: PERIPHERAL INTRAVASCULAR LITHOTRIPSY: CATH118324

## 2023-04-27 LAB — COMPREHENSIVE METABOLIC PANEL
ALT: 23 U/L (ref 0–44)
AST: 25 U/L (ref 15–41)
Albumin: 3.4 g/dL — ABNORMAL LOW (ref 3.5–5.0)
Alkaline Phosphatase: 59 U/L (ref 38–126)
Anion gap: 11 (ref 5–15)
BUN: 23 mg/dL (ref 8–23)
CO2: 20 mmol/L — ABNORMAL LOW (ref 22–32)
Calcium: 9.3 mg/dL (ref 8.9–10.3)
Chloride: 103 mmol/L (ref 98–111)
Creatinine, Ser: 1.59 mg/dL — ABNORMAL HIGH (ref 0.61–1.24)
GFR, Estimated: 48 mL/min — ABNORMAL LOW (ref >60)
Glucose, Bld: 93 mg/dL (ref 70–99)
Potassium: 3.9 mmol/L (ref 3.5–5.1)
Sodium: 134 mmol/L — ABNORMAL LOW (ref 135–145)
Total Bilirubin: 0.6 mg/dL (ref 0.3–1.2)
Total Protein: 6.1 g/dL — ABNORMAL LOW (ref 6.5–8.1)

## 2023-04-27 LAB — CBC WITH DIFFERENTIAL/PLATELET
Abs Immature Granulocytes: 0.02 10*3/uL (ref 0.00–0.07)
Basophils Absolute: 0 10*3/uL (ref 0.0–0.1)
Basophils Relative: 1 %
Eosinophils Absolute: 0.4 10*3/uL (ref 0.0–0.5)
Eosinophils Relative: 5 %
HCT: 35 % — ABNORMAL LOW (ref 39.0–52.0)
Hemoglobin: 12.9 g/dL — ABNORMAL LOW (ref 13.0–17.0)
Immature Granulocytes: 0 %
Lymphocytes Relative: 29 %
Lymphs Abs: 1.9 10*3/uL (ref 0.7–4.0)
MCH: 28.1 pg (ref 26.0–34.0)
MCHC: 36.9 g/dL — ABNORMAL HIGH (ref 30.0–36.0)
MCV: 76.3 fL — ABNORMAL LOW (ref 80.0–100.0)
Monocytes Absolute: 0.6 10*3/uL (ref 0.1–1.0)
Monocytes Relative: 9 %
Neutro Abs: 3.6 10*3/uL (ref 1.7–7.7)
Neutrophils Relative %: 56 %
Platelets: 162 10*3/uL (ref 150–400)
RBC: 4.59 MIL/uL (ref 4.22–5.81)
RDW: 13.5 % (ref 11.5–15.5)
WBC: 6.5 10*3/uL (ref 4.0–10.5)
nRBC: 0 % (ref 0.0–0.2)

## 2023-04-27 LAB — GLUCOSE, CAPILLARY
Glucose-Capillary: 137 mg/dL — ABNORMAL HIGH (ref 70–99)
Glucose-Capillary: 78 mg/dL (ref 70–99)
Glucose-Capillary: 92 mg/dL (ref 70–99)
Glucose-Capillary: 90 mg/dL (ref 70–99)
Glucose-Capillary: 190 mg/dL — ABNORMAL HIGH (ref 70–99)

## 2023-04-27 LAB — HEMOGLOBIN A1C
Hgb A1c MFr Bld: 7.7 % — ABNORMAL HIGH (ref 4.8–5.6)
Mean Plasma Glucose: 174 mg/dL

## 2023-04-27 LAB — MAGNESIUM: Magnesium: 1.8 mg/dL (ref 1.7–2.4)

## 2023-04-27 LAB — PHOSPHORUS: Phosphorus: 3.3 mg/dL (ref 2.5–4.6)

## 2023-04-27 SURGERY — ABDOMINAL AORTOGRAM W/LOWER EXTREMITY
Anesthesia: LOCAL

## 2023-04-27 MED ORDER — ACETAMINOPHEN 325 MG PO TABS
650.0000 mg | ORAL_TABLET | ORAL | Status: DC | PRN
  Administered 2023-04-29 – 2023-04-30 (×4): 650 mg via ORAL
  Filled 2023-04-27 (×4): qty 2

## 2023-04-27 MED ORDER — MIDAZOLAM HCL 2 MG/2ML IJ SOLN
INTRAMUSCULAR | Status: AC
  Filled 2023-04-27: qty 2

## 2023-04-27 MED ORDER — MAGNESIUM SULFATE 2 GM/50ML IV SOLN
2.0000 g | Freq: Once | INTRAVENOUS | Status: AC
  Administered 2023-04-27: 2 g via INTRAVENOUS
  Filled 2023-04-27: qty 50

## 2023-04-27 MED ORDER — SODIUM CHLORIDE 0.9 % IV SOLN: INTRAVENOUS | Status: DC

## 2023-04-27 MED ORDER — HEPARIN (PORCINE) IN NACL 1000-0.9 UT/500ML-% IV SOLN
INTRAVENOUS | Status: DC | PRN
  Administered 2023-04-27: 1000 mL
  Administered 2023-04-27 (×2): 500 mL

## 2023-04-27 MED ORDER — OXYCODONE HCL 5 MG PO TABS
5.0000 mg | ORAL_TABLET | ORAL | Status: DC | PRN
  Administered 2023-04-27: 10 mg via ORAL
  Filled 2023-04-27 (×3): qty 2

## 2023-04-27 MED ORDER — SODIUM CHLORIDE 0.9% FLUSH: 3.0000 mL | INTRAVENOUS | Status: DC | PRN

## 2023-04-27 MED ORDER — LIDOCAINE HCL (PF) 1 % IJ SOLN
INTRAMUSCULAR | Status: DC | PRN
  Administered 2023-04-27: 15 mL via INTRADERMAL

## 2023-04-27 MED ORDER — SODIUM CHLORIDE 0.9% FLUSH
3.0000 mL | Freq: Two times a day (BID) | INTRAVENOUS | Status: DC
  Administered 2023-04-28 – 2023-04-30 (×5): 3 mL via INTRAVENOUS

## 2023-04-27 MED ORDER — FENTANYL CITRATE (PF) 100 MCG/2ML IJ SOLN
INTRAMUSCULAR | Status: AC
  Filled 2023-04-27: qty 2

## 2023-04-27 MED ORDER — ONDANSETRON HCL 4 MG/2ML IJ SOLN: 4.0000 mg | Freq: Four times a day (QID) | INTRAMUSCULAR | Status: DC | PRN

## 2023-04-27 MED ORDER — SODIUM CHLORIDE 0.9 % IV SOLN: 250.0000 mL | INTRAVENOUS | Status: DC | PRN

## 2023-04-27 MED ORDER — HEPARIN SODIUM (PORCINE) 1000 UNIT/ML IJ SOLN
INTRAMUSCULAR | Status: DC | PRN
  Administered 2023-04-27: 8000 [IU] via INTRAVENOUS

## 2023-04-27 MED ORDER — LIDOCAINE HCL (PF) 1 % IJ SOLN
INTRAMUSCULAR | Status: AC
  Filled 2023-04-27: qty 30

## 2023-04-27 MED ORDER — IODIXANOL 320 MG/ML IV SOLN
INTRAVENOUS | Status: DC | PRN
  Administered 2023-04-27: 105 mL via INTRA_ARTERIAL

## 2023-04-27 MED ORDER — MIDAZOLAM HCL 2 MG/2ML IJ SOLN
INTRAMUSCULAR | Status: DC | PRN
  Administered 2023-04-27: 1 mg via INTRAVENOUS

## 2023-04-27 MED ORDER — SODIUM CHLORIDE 0.9 % WEIGHT BASED INFUSION
1.0000 mL/kg/h | INTRAVENOUS | Status: AC
  Administered 2023-04-27: 1 mL/kg/h via INTRAVENOUS

## 2023-04-27 MED ORDER — LABETALOL HCL 5 MG/ML IV SOLN: 10.0000 mg | INTRAVENOUS | Status: DC | PRN

## 2023-04-27 MED ORDER — FENTANYL CITRATE (PF) 100 MCG/2ML IJ SOLN
INTRAMUSCULAR | Status: DC | PRN
  Administered 2023-04-27: 50 ug via INTRAVENOUS

## 2023-04-27 MED ORDER — HYDRALAZINE HCL 20 MG/ML IJ SOLN: 5.0000 mg | INTRAMUSCULAR | Status: DC | PRN

## 2023-04-27 MED ORDER — OXYCODONE HCL 5 MG PO TABS
ORAL_TABLET | ORAL | Status: AC
  Filled 2023-04-27: qty 2

## 2023-04-27 SURGICAL SUPPLY — 19 items
CATH OMNI FLUSH 5F 65CM (CATHETERS) IMPLANT
CATH QUICKCROSS .035X135CM (MICROCATHETER) IMPLANT
CLOSURE MYNX CONTROL 6F/7F (Vascular Products) IMPLANT
DCB RANGER 6.0X40 135 (BALLOONS) IMPLANT
GLIDEWIRE ADV .035X260CM (WIRE) IMPLANT
GUIDEWIRE ANGLED .035X150CM (WIRE) IMPLANT
KIT ENCORE 26 ADVANTAGE (KITS) IMPLANT
KIT MICROPUNCTURE NIT STIFF (SHEATH) IMPLANT
KIT PV (KITS) ×2 IMPLANT
RANGER DCB 6.0X40 135 (BALLOONS) ×2
SHEATH CATAPULT 6FR 60 (SHEATH) IMPLANT
SHEATH PINNACLE 5F 10CM (SHEATH) IMPLANT
SHEATH PINNACLE 6F 10CM (SHEATH) IMPLANT
SHEATH PROBE COVER 6X72 (BAG) IMPLANT
SYR MEDRAD MARK 7 150ML (SYRINGE) ×2 IMPLANT
TRANSDUCER W/STOPCOCK (MISCELLANEOUS) ×2 IMPLANT
TRAY PV CATH (CUSTOM PROCEDURE TRAY) ×2 IMPLANT
WIRE BENTSON .035X145CM (WIRE) IMPLANT
WIRE SPARTACORE .014X300CM (WIRE) IMPLANT

## 2023-04-27 NOTE — Progress Notes (Signed)
           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 Randie Heinz in the Cath Lab.    He agrees to proceed with angiogram today  Mosetta Pigeon PA-C

## 2023-04-27 NOTE — Progress Notes (Signed)
PROGRESS NOTE    Jose [name]  ZOX:[id number] DOB: [birth year] DOA: 04/24/2023 PCP: [name]   Brief Narrative:  HPI per Dr. Mikey College on 04/24/23 HPI: Jose [name] is a 62 y.o. male with medical history significant of CAD status post stenting x 2, CKD stage II, COPD, IDDM, HTN, came with worsening of right foot pain.   Patient denies any history of claudication.  His symptoms started 4-5 weeks ago, initially he said that his no tenderness to scratched and caused blisters on the top side of  right-sided second and third toe, gradually he started to see discoloration of the second and third toes along with severe pain.  He went to see his PCP at [facility], started him on oxycodone.  Despite taking oxycodone 3 times a day, patient has had unbearable pain and significant limited his ambulation.  The pain has been worsening in the evening and night, which significantly affected his sleeping.  Denies any fever or chills.  He also complained about feeling " discomfort on the epigastric area since last week" Curnes will be a did a abdominal ultrasound last week and patient was told no significant findings to explain his symptoms.  He does not smoke, he drinks only occasionally.  No family history of cancer   ED Course: Afebrile, nontachycardic nonhypotensive nonhypoxic.  CTA showed diffuse atherosclerotic calcification of arteries and lower extremities, moderate to severe focal stenosis of the right popliteal artery.  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  AST ALT bilirubin within normal limits.  Creatinine 1.6 about his baseline, glucose 215 sodium 128 compared to baseline 126-132.   Patient was given IV narcotics, and complained about frequent breakthrough pains and unable to ambulate.  Vascular surgeon evaluated patient in ED and recommend angiogram on Monday.  ED also gave patient 1 time dose of cefepime and Flagyl   **Interim History Pain was  uncontrolled so pain but just inside admitted.  Angiogram is being planned for Monday.  Will start IV fluids given slightly elevated renal function and continue current plan of care.   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  Agreed that no indication for systemic anticoagulation at this point.  Clinically and on physical exam, no symptoms signs of active infection right second and third toe, will hold off antibiotics. -Pain control, plan for alternative dilaudid and PO Oxycodone and adjustments made today for 5-10 mg q4hprn -Overall risk of losing the right 2rd and 3rd toes is high, patient made aware. -ESR was 5 and CRP was 1.2 -ABI Noncompressible, no significant PVD Hx and denies any history of claudication. -Continue aspirin Plavix and statin -Plan is for angiogram with possible intervention with focus on the right lower extremity today with Dr. Randie Heinz in the Cath Lab and patient is currently NPO   Incidental finding of Pancreatic Tail Mass -Suspect pancreatic cancer.   -CA19-9 sent. -Discussed with patient regarding the incidental CT finding, patient agreed with outpatient follow-up with contrast VOA for outpatient MRCP.   -His next clinic visit be June 11. -Consider Trouseau syndrome for his ischemic toes   AKI on CKD stage IIIb Metabolic Acidosis -Euvolemic, creatinine level now elevated -Baseline Cr appears to be around 1.5-1.7 -BUN/Cr Trend: Recent Labs  Lab 04/24/23 0842 04/25/23 1119 04/26/23 0218 04/27/23 0940  BUN 28* 31* 36* 23  CREATININE 1.63* 2.06* 2.00* 1.59*  -  Will continue NS at 75 mL/hr but increased to 100 mL/hr by Vascular Surgery in plans for Angiogram -Avoid Nephrotoxic Medications, Contrast Dyes, Hypotension and Dehydration to Ensure Adequate Renal Perfusion and will need to Renally Adjust Meds -Continue to Monitor and Trend Renal Function carefully and repeat  CMP in the AM    Hypomagnesemia -Patient's Mag Level Trend: Recent Labs  Lab 04/25/23 1119 04/26/23 0218 04/27/23 0940  MG 1.5* 1.5* 1.8  -Replete with IV Mag Sulfate 2 grams again -Continue to Monitor and Replete as Necessary -Repeat Mag in the AM    Acute on Chronic Hyponatremia -Euvolemic, sodium level stable -Fluid restriction but will start NS given worsened Renal Fxn and remains on IVF with NS at 100 mL/hr  -Na+ Trend: Recent Labs  Lab 04/24/23 0842 04/25/23 1119 04/26/23 0218 04/27/23 0940  NA 128* 131* 130* 134*   IDDM with Hyperglycemia -Continue Lantus 30 units daily -Add sliding scale -Last HbA1c was 7.6 in our system back in September 2023 and repeat was 7.7 -Add Januvia -CBG Trend:  Recent Labs  Lab 04/25/23 2015 04/26/23 0723 04/26/23 1130 04/26/23 1623 04/26/23 2107 04/27/23 0720 04/27/23 1143  GLUCAP 140* 80 162* 126* 166* 137* 78    HTN, refractory -Continue amlodipine and Coreg, hydralazine -Euvolemic, will hold off Lasix -Continue to Monitor BP per Protocol -Last BP reading was 166/73   COPD -Stable, continue Mometasone-Formoterol 200-5 mcg/ACT 2 puff IH BID and Albuterol 3 mL IH q6hprn Wheezing and SOB SpO2: 98 % -Continue to Monitor Respiratory Status Carefully   CAD -No acute concerns -ASA 81 mg po Daily, Clopidogrel 75 mg po Daily and Atorvastatin 80 mg po Daily   Hypoalbuminemia -Patient's Albumin Trend: Recent Labs  Lab 04/24/23 0842 04/25/23 1119 04/26/23 0218 04/27/23 0940  ALBUMIN 3.8 3.5 3.4* 3.4*  -Continue to Monitor and Trend and repeat CMP in the AM  Glaucoma -C/w Brimonidine 1 drop Both Eyes TID and Latanoprost 1 drop Both Eyes qHS    DVT prophylaxis: heparin injection 5,000 Units Start: 04/24/23 2200    Code Status: Full Code Family Communication: No family present at bedside  Disposition Plan:  Level of care: Med-Surg Status is: Inpatient Remains inpatient appropriate because: Needs further clinical  improvement and clearance by Vascular Surgery   Consultants:  Vascular Surgery  Procedures:  As delineated as above  Antimicrobials:  Anti-infectives (From admission, onward)    Start     Dose/Rate Route Frequency Ordered Stop   04/24/23 1500  cefTRIAXone (ROCEPHIN) 2 g in sodium chloride 0.9 % 100 mL IVPB        2 g 200 mL/hr over 30 Minutes Intravenous  Once 04/24/23 1450 04/24/23 1952   04/24/23 1500  metroNIDAZOLE (FLAGYL) IVPB 500 mg        500 mg 100 mL/hr over 60 Minutes Intravenous  Once 04/24/23 1450 04/25/23 0817       Subjective: Seen and examined at bedside and he is doing okay and resting and wanting to sleep.  States he continues to have pain.  No nausea or vomiting.  Denies lightheadedness or dizziness.  Awaiting angiogram later this afternoon and has no concerns or complaints at this time.  Objective: Vitals:   04/26/23 2002 04/26/23 2020 04/27/23 0512 04/27/23 0718  BP: (!) 151/73  (!) 153/71 (!) 166/73  Pulse: (!) 58  (!) 55 (!) 56  Resp: 17  18 18   Temp: 98.4 F (36.9 C)  98.1 F (36.7 C) 98.2 F (36.8 C)  TempSrc:  Oral   Oral  SpO2: 95% 95% 97% 98%  Weight:      Height:        Intake/Output Summary (Last 24 hours) at 04/27/2023 1322 Last data filed at 04/27/2023 0900 Gross per 24 hour  Intake --  Output 2225 ml  Net -2225 ml   Filed Weights   04/24/23 0824  Weight: 81.6 kg   Examination: Physical Exam:  Constitutional: WN/WD obese obese African-American male in no acute distress Respiratory: Diminished to auscultation bilaterally, no wheezing, rales, rhonchi or crackles. Normal respiratory effort and patient is not tachypenic. No accessory muscle use.  Unlabored breathing Cardiovascular: RRR, no murmurs / rubs / gallops. S1 and S2 auscultated. No extremity edema.  Abdomen: Soft, non-tender, Distended 2/2 body habitus. Bowel sounds positive.  GU: Deferred. Musculoskeletal: No clubbing / cyanosis of digits/nails. No joint deformity upper and  lower extremities but has dry gangrene of the 2nd and 3rd toes Skin: Dry gangrene of the 2nd and 3rd toes Neurologic: CN 2-12 grossly intact with no focal deficits. Romberg sign and cerebellar reflexes not assessed.  Psychiatric: Normal judgment and insight. Alert and oriented x 3. Normal mood and appropriate affect.   Data Reviewed: I have personally reviewed following labs and imaging studies  CBC: Recent Labs  Lab 04/24/23 0842 04/25/23 1119 04/26/23 0218 04/27/23 0940  WBC 6.7 7.2 7.7 6.5  NEUTROABS 3.7 3.7 3.8 3.6  HGB 13.6 13.4 12.6* 12.9*  HCT 38.5* 36.9* 34.9* 35.0*  MCV 77.9* 76.7* 76.4* 76.3*  PLT 178 175 169 162   Basic Metabolic Panel: Recent Labs  Lab 04/24/23 0842 04/25/23 1119 04/26/23 0218 04/27/23 0940  NA 128* 131* 130* 134*  K 4.0 3.8 3.7 3.9  CL 97* 101 100 103  CO2 20* 23 22 20*  GLUCOSE 215* 198* 129* 93  BUN 28* 31* 36* 23  CREATININE 1.63* 2.06* 2.00* 1.59*  CALCIUM 9.0 9.3 9.2 9.3  MG  --  1.5* 1.5* 1.8  PHOS  --  3.1 3.9 3.3   GFR: Estimated Creatinine Clearance: 50 mL/min (A) (by C-G formula based on SCr of 1.59 mg/dL (H)). Liver Function Tests: Recent Labs  Lab 04/24/23 0842 04/25/23 1119 04/26/23 0218 04/27/23 0940  AST 28 23 21 25   ALT 23 20 20 23   ALKPHOS 71 59 57 59  BILITOT 0.4 0.4 0.5 0.6  PROT 6.3* 5.8* 5.8* 6.1*  ALBUMIN 3.8 3.5 3.4* 3.4*   No results for input(s): "LIPASE", "AMYLASE" in the last 168 hours. No results for input(s): "AMMONIA" in the last 168 hours. Coagulation Profile: No results for input(s): "INR", "PROTIME" in the last 168 hours. Cardiac Enzymes: No results for input(s): "CKTOTAL", "CKMB", "CKMBINDEX", "TROPONINI" in the last 168 hours. BNP (last 3 results) No results for input(s): "PROBNP" in the last 8760 hours. HbA1C: Recent Labs    04/24/23 2002  HGBA1C 7.7*   CBG: Recent Labs  Lab 04/26/23 1130 04/26/23 1623 04/26/23 2107 04/27/23 0720 04/27/23 1143  GLUCAP 162* 126* 166* 137* 78    Lipid Profile: No results for input(s): "CHOL", "HDL", "LDLCALC", "TRIG", "CHOLHDL", "LDLDIRECT" in the last 72 hours. Thyroid Function Tests: No results for input(s): "TSH", "T4TOTAL", "FREET4", "T3FREE", "THYROIDAB" in the last 72 hours. Anemia Panel: No results for input(s): "VITAMINB12", "FOLATE", "FERRITIN", "TIBC", "IRON", "RETICCTPCT" in the last 72 hours. Sepsis Labs: Recent Labs  Lab 04/24/23 0840 04/24/23 2002  LATICACIDVEN 1.0 0.7    No results found for this or any previous visit (from the past  240 hour(s)).   Radiology Studies: No results found.  Scheduled Meds:  amLODipine  10 mg Oral Daily   aspirin EC  81 mg Oral Daily   atorvastatin  80 mg Oral Daily   brimonidine  1 drop Both Eyes TID   carvedilol  12.5 mg Oral BID WC   clopidogrel  75 mg Oral Q breakfast   heparin  5,000 Units Subcutaneous Q12H   hydrALAZINE  100 mg Oral BID   insulin aspart  0-9 Units Subcutaneous TID WC   insulin glargine-yfgn  25 Units Subcutaneous Q breakfast   isosorbide mononitrate  30 mg Oral Daily   latanoprost  1 drop Both Eyes QHS   linagliptin  5 mg Oral Daily   magnesium oxide  400 mg Oral Daily   mometasone-formoterol  2 puff Inhalation BID   polyethylene glycol  17 g Oral BID   senna-docusate  1 tablet Oral BID   traZODone  75 mg Oral QHS   Continuous Infusions:  sodium chloride 100 mL/hr at 04/27/23 0539   magnesium sulfate bolus IVPB      LOS: 3 days   Marguerita Merles, DO [facility] Available via Epic secure chat 7am-7pm After these hours, please refer to coverage provider listed on [email or web address] 04/27/2023, 1:22 PM

## 2023-04-27 NOTE — Op Note (Signed)
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 Randie Heinz, MD Procedure Performed: 1.  Ultrasound-guided cannulation and Mynx device closure left common femoral artery 2.  Catheter in aorta and bilateral lower extremity runoff 3.  Selection with catheter of right popliteal artery 4.  6 x 60 mm shockwave lithotripsy right popliteal artery 5.  Drug-coated balloon angioplasty 6 x 40 mm Ranger right popliteal artery 6.  Moderate sedation with fentanyl and Versed for 69 minutes  Indications: 62 year old male with chronic right lower extremity limb threatening ischemia with dampened monophasic ABIs and 0 toe pressure on the right with a palpable popliteal pulse and nothing palpable below.  He is now indicated for angiography with possible intervention.  Findings: His aorta and bilateral common external iliac arteries were patent as are the bilateral common femoral arteries.  He does have a steep aortic bifurcation.  The right SFA is calcified but there is no evidence of any stenosis.  Just below the knee joint there is heavy calcification of the popliteal artery it is subtotally occluded.  After shockwave lithotripsy and drug-coated balloon angioplasty this is reduced to 0% stenosis where previously was greater than 90% stenosis with heavy calcification.  The dominant runoff on the right side is via the peroneal artery which gives rise to the plantar vessels that are posterior tibial base.  There is posterior tibial artery runoff but this occludes above the ankle and the anterior tibial artery is occluded much higher in the leg.  At completion there is a multiphasic peroneal artery signal at the ankle.   Procedure:  The patient was identified in the holding area and taken to room 8.  The patient was then placed supine on the  table and prepped and draped in the usual sterile fashion.  A time out was called.  Ultrasound was used to evaluate the left common femoral artery which was noted to be patent and the area was anesthetized with 1% lidocaine and cannulated with micropuncture needle followed by wire and a sheath.  And images saved to the permanent record.  Concomitantly we administered fentanyl and Versed for moderate sedation and his vital signs were monitored throughout the case.  We placed a Bentson wire followed by 5 [place] sheath and Omni catheter to the level of L1 and aortogram was performed followed by limited bilateral lower extremity angiography to evaluate the bilateral common femoral arteries bilateral common and external iliac arteries.  We then crossed the bifurcation using a Glidewire and the Omni catheter and perform right lower extremity angiography and then used a Glidewire and quick cross catheter to the level of the knee to perform angiography below the knee and identify the tibial runoff.  With the above findings we placed a long 6 French sheath over a Glidewire advantage the patient was fully heparinized.  We then used a quick cross catheter and Glidewire advantage were able to cross the subtotally occluded popliteal artery and confirmed intraluminal access.  We placed an 014 wire and then performed lithotripsy with a shockwave 6 x 60 mm balloon for a total of 300 pulses.  Completion angiography demonstrated minimal residual stenosis and the calcium had significantly improved.  We then primarily performed drug-coated balloon angioplasty with a 6 x 40 mm Ranger at nominal pressure.  Completion demonstrated no residual stenosis no dissection's.  Runoff was via the peroneal  artery there is possibly a 30% stenosis at the takeoff of the tibioperoneal trunk this is not treated.  We exchanged for a short 6 French sheath over a Bentson wire and deployed a minx device.  Patient tolerated procedure without any  complication.  Contrast: 105cc   Cathy Crounse C. Randie Heinz, MD [facility] Office: [phone] Pager: [phone]

## 2023-04-27 NOTE — Interval H&P Note (Signed)
History and Physical Interval Note:  04/27/2023 1:14 PM  Jose [name]  has presented today for surgery, with the diagnosis of foot wound.  The various methods of treatment have been discussed with the patient and family. After consideration of risks, benefits and other options for treatment, the patient has consented to  Procedure(s): ABDOMINAL AORTOGRAM W/LOWER EXTREMITY (N/A) as a surgical intervention.  The patient's history has been reviewed, patient examined, no change in status, stable for surgery.  I have reviewed the patient's chart and labs.  Questions were answered to the patient's satisfaction.     Lemar Livings

## 2023-04-28 ENCOUNTER — Encounter (HOSPITAL_COMMUNITY): Payer: Self-pay | Admitting: Vascular Surgery

## 2023-04-28 DIAGNOSIS — E118 Type 2 diabetes mellitus with unspecified complications: Secondary | ICD-10-CM | POA: Diagnosis not present

## 2023-04-28 DIAGNOSIS — I96 Gangrene, not elsewhere classified: Secondary | ICD-10-CM | POA: Diagnosis not present

## 2023-04-28 DIAGNOSIS — I2510 Atherosclerotic heart disease of native coronary artery without angina pectoris: Secondary | ICD-10-CM | POA: Diagnosis not present

## 2023-04-28 DIAGNOSIS — N1832 Chronic kidney disease, stage 3b: Secondary | ICD-10-CM | POA: Diagnosis not present

## 2023-04-28 LAB — LIPID PANEL
Cholesterol: 95 mg/dL (ref 0–200)
HDL: 40 mg/dL — ABNORMAL LOW (ref >40)
LDL Cholesterol: 38 mg/dL (ref 0–99)
Total CHOL/HDL Ratio: 2.4 RATIO
Triglycerides: 84 mg/dL (ref <150)
VLDL: 17 mg/dL (ref 0–40)

## 2023-04-28 LAB — COMPREHENSIVE METABOLIC PANEL
ALT: 25 U/L (ref 0–44)
AST: 35 U/L (ref 15–41)
Albumin: 3.4 g/dL — ABNORMAL LOW (ref 3.5–5.0)
Alkaline Phosphatase: 60 U/L (ref 38–126)
Anion gap: 11 (ref 5–15)
BUN: 27 mg/dL — ABNORMAL HIGH (ref 8–23)
CO2: 19 mmol/L — ABNORMAL LOW (ref 22–32)
Calcium: 9.3 mg/dL (ref 8.9–10.3)
Chloride: 99 mmol/L (ref 98–111)
Creatinine, Ser: 1.71 mg/dL — ABNORMAL HIGH (ref 0.61–1.24)
GFR, Estimated: 44 mL/min — ABNORMAL LOW (ref >60)
Glucose, Bld: 153 mg/dL — ABNORMAL HIGH (ref 70–99)
Potassium: 4.7 mmol/L (ref 3.5–5.1)
Sodium: 129 mmol/L — ABNORMAL LOW (ref 135–145)
Total Bilirubin: 1 mg/dL (ref 0.3–1.2)
Total Protein: 5.9 g/dL — ABNORMAL LOW (ref 6.5–8.1)

## 2023-04-28 LAB — GLUCOSE, CAPILLARY
Glucose-Capillary: 119 mg/dL — ABNORMAL HIGH (ref 70–99)
Glucose-Capillary: 180 mg/dL — ABNORMAL HIGH (ref 70–99)
Glucose-Capillary: 182 mg/dL — ABNORMAL HIGH (ref 70–99)
Glucose-Capillary: 172 mg/dL — ABNORMAL HIGH (ref 70–99)

## 2023-04-28 LAB — MAGNESIUM: Magnesium: 2.3 mg/dL (ref 1.7–2.4)

## 2023-04-28 LAB — CBC WITH DIFFERENTIAL/PLATELET
Abs Immature Granulocytes: 0.02 10*3/uL (ref 0.00–0.07)
Basophils Absolute: 0 10*3/uL (ref 0.0–0.1)
Basophils Relative: 0 %
Eosinophils Absolute: 0.3 10*3/uL (ref 0.0–0.5)
Eosinophils Relative: 4 %
HCT: 34.9 % — ABNORMAL LOW (ref 39.0–52.0)
Hemoglobin: 12.7 g/dL — ABNORMAL LOW (ref 13.0–17.0)
Immature Granulocytes: 0 %
Lymphocytes Relative: 27 %
Lymphs Abs: 2.2 10*3/uL (ref 0.7–4.0)
MCH: 28.1 pg (ref 26.0–34.0)
MCHC: 36.4 g/dL — ABNORMAL HIGH (ref 30.0–36.0)
MCV: 77.2 fL — ABNORMAL LOW (ref 80.0–100.0)
Monocytes Absolute: 0.8 10*3/uL (ref 0.1–1.0)
Monocytes Relative: 10 %
Neutro Abs: 4.8 10*3/uL (ref 1.7–7.7)
Neutrophils Relative %: 59 %
Platelets: 185 10*3/uL (ref 150–400)
RBC: 4.52 MIL/uL (ref 4.22–5.81)
RDW: 13.6 % (ref 11.5–15.5)
WBC: 8.1 10*3/uL (ref 4.0–10.5)
nRBC: 0 % (ref 0.0–0.2)

## 2023-04-28 LAB — PHOSPHORUS: Phosphorus: 3.6 mg/dL (ref 2.5–4.6)

## 2023-04-28 LAB — CANCER ANTIGEN 19-9: CA 19-9: 13 U/mL (ref 0–35)

## 2023-04-28 MED ORDER — POLYETHYLENE GLYCOL 3350 17 G PO PACK
17.0000 g | PACK | Freq: Two times a day (BID) | ORAL | Status: DC
  Administered 2023-04-28 – 2023-04-30 (×4): 17 g via ORAL
  Filled 2023-04-28 (×5): qty 1

## 2023-04-28 MED ORDER — OXYCODONE HCL 5 MG PO TABS: 5.0000 mg | ORAL_TABLET | Freq: Four times a day (QID) | ORAL | 0 refills | Status: DC | PRN

## 2023-04-28 MED ORDER — BISACODYL 10 MG RE SUPP: 10.0000 mg | Freq: Every day | RECTAL | Status: DC | PRN

## 2023-04-28 NOTE — Progress Notes (Signed)
PHARMACIST LIPID MONITORING   Jose [name] is a 62 y.o. male admitted on 04/24/2023 with PVD.  Pharmacy has been consulted to optimize lipid-lowering therapy with the indication of secondary prevention for clinical ASCVD.  Recent Labs:  Lipid Panel (last 6 months):   Lab Results  Component Value Date   CHOL 95 04/28/2023   TRIG 84 04/28/2023   HDL 40 (L) 04/28/2023   CHOLHDL 2.4 04/28/2023   VLDL 17 04/28/2023   LDLCALC 38 04/28/2023    Hepatic function panel (last 6 months):   Lab Results  Component Value Date   AST 35 04/28/2023   ALT 25 04/28/2023   ALKPHOS 60 04/28/2023   BILITOT 1.0 04/28/2023    SCr (since admission):   Serum creatinine: 1.71 mg/dL (H) 16/10/96 0454 Estimated creatinine clearance: 46.5 mL/min (A)  Current therapy and lipid therapy tolerance Current lipid-lowering therapy: atorvastatin 80mg /day Documented or reported allergies or intolerances to lipid-lowering therapies (if applicable): simvastatin   Plan:    1.Statin intensity (high intensity recommended for all patients regardless of the LDL):  No statin changes. The patient is already on a high intensity statin.  2.Add ezetimibe (if any one of the following):   Not indicated at this time.  3.Refer to lipid clinic:   No  4.Follow-up with:  Primary care provider - [facility], [facility]  5.Follow-up labs after discharge:  No changes in lipid therapy, repeat a lipid panel in one year.      Harland German, PharmD Clinical Pharmacist **Pharmacist phone directory can now be found on [email or web address] ([email or web address]).  Listed under [facility].

## 2023-04-28 NOTE — Progress Notes (Addendum)
PROGRESS NOTE    Adelfo Palinkas  ZOX:[id number] DOB: 07-25-1961 DOA: 04/24/2023 PCP: [name]   Brief Narrative:  HPI per Dr. Mikey College on 04/24/23 HPI: Macklyn Junior Guanzon is a 62 y.o. male with medical history significant of CAD status post stenting x 2, CKD stage II, COPD, IDDM, HTN, came with worsening of right foot pain.   Patient denies any history of claudication.  His symptoms started 4-5 weeks ago, initially he said that his no tenderness to scratched and caused blisters on the top side of  right-sided second and third toe, gradually he started to see discoloration of the second and third toes along with severe pain.  He went to see his PCP at [facility], started him on oxycodone.  Despite taking oxycodone 3 times a day, patient has had unbearable pain and significant limited his ambulation.  The pain has been worsening in the evening and night, which significantly affected his sleeping.  Denies any fever or chills.  He also complained about feeling " discomfort on the epigastric area since last week" Curnes will be a did a abdominal ultrasound last week and patient was told no significant findings to explain his symptoms.  He does not smoke, he drinks only occasionally.  No family history of cancer   ED Course: Afebrile, nontachycardic nonhypotensive nonhypoxic.  CTA showed diffuse atherosclerotic calcification of arteries and lower extremities, moderate to severe focal stenosis of the right popliteal artery.  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  AST ALT bilirubin within normal limits.  Creatinine 1.6 about his baseline, glucose 215 sodium 128 compared to baseline 126-132.   Patient was given IV narcotics, and complained about frequent breakthrough pains and unable to ambulate.  Vascular surgeon evaluated patient in ED and recommend angiogram on Monday.  ED also gave patient 1 time dose of cefepime and Flagyl   **Interim History Pain was  uncontrolled so pain but just inside admitted.  Angiogram was done Monday and he is status post a 6 x 6 mm shockwave lithotripsy of the left popliteal artery and popliteal angioplasty and has peroneal runoff multiphasic is postoperative day 1.  Continued to have some pain so we will anticipate discharging in next 24 to 48 hours if pain is controlled and if he has had a bowel movement and evaluated by physical therapy and Occupational Therapy.   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  Agreed that no indication for systemic anticoagulation at this point.  Clinically and on physical exam, no symptoms signs of active infection right second and third toe, will hold off antibiotics. -Pain control, plan for alternative dilaudid and PO Oxycodone and adjustments made today for 5-10 mg q4hprn -Overall risk of losing the right 2rd and 3rd toes is high, patient made aware. -ESR was 5 and CRP was 1.2 -ABI Noncompressible, no significant PVD Hx and denies any history of claudication. -Continue aspirin Plavix and statin -Patient underwent angiogram and is status post a 6 x 6 mm shockwave lithotripsy of the left popliteal artery and popliteal angioplasty and has peroneal runoff multiphasic and postoperative day 1. -Vascular feels that his stable from their standpoint the patient will complain about pain still.  Felt weak so PT OT consulted and they are recommending no follow-up.  Anticipating discharging home in next 24 to 48 hours if pain is controlled  and if doing well otherwise. -Lipid panel was done and he has a total cholesterol/HDL ratio 2.4, cholesterol 95, HDL 40, LDL 38, triglycerides of 84, VLDL 17 -Pulm assist was consulted to optimize lipid-lowering therapy for secondary prevention of clinical ASCVD   Incidental finding of Pancreatic Tail Mass -Suspect pancreatic cancer.    -CA19-9 was 13. -Discussed with patient regarding the incidental CT finding, patient agreed with outpatient follow-up with contrast VOA for outpatient MRCP.   -His next clinic visit be June 11. -Consider Trouseau syndrome for his ischemic toes   AKI on CKD stage IIIb Metabolic Acidosis -Euvolemic, creatinine level now elevated -Baseline Cr appears to be around 1.5-1.7 -BUN/Cr Trend: Recent Labs  Lab 04/24/23 0842 04/25/23 1119 04/26/23 0218 04/27/23 0940 04/28/23 0511  BUN 28* 31* 36* 23 27*  CREATININE 1.63* 2.06* 2.00* 1.59* 1.71*  -Will continue NS at 75 mL/hr but increased to 100 mL/hr by Vascular Surgery in plans for Angiogram and continued but now will stop today  -Has slight metabolic Acidosis with a due to of 19, anion gap of 11, chloride level of 99 -Avoid Nephrotoxic Medications, Contrast Dyes, Hypotension and Dehydration to Ensure Adequate Renal Perfusion and will need to Renally Adjust Meds -Continue to Monitor and Trend Renal Function carefully and repeat CMP in the AM    Hypomagnesemia -Patient's Mag Level Trend: Recent Labs  Lab 04/25/23 1119 04/26/23 0218 04/27/23 0940 04/28/23 0511  MG 1.5* 1.5* 1.8 2.3  -Replete with IV Mag Sulfate 2 grams again -Continue to Monitor and Replete as Necessary -Repeat Mag in the AM    Acute on Chronic Hyponatremia -Euvolemic, sodium level stable -Fluid restriction was initiated but Vascular started the patient on NS at 100 mL/hr which we will now stop -Na+ Trend: Recent Labs  Lab 04/24/23 0842 04/25/23 1119 04/26/23 0218 04/27/23 0940 04/28/23 0511  NA 128* 131* 130* 134* 129*   IDDM with Hyperglycemia -Continue Lantus 30 units daily -Add sliding scale -Last HbA1c was 7.6 in our system back in September 2023 and repeat was 7.7 -Add Januvia -CBG Trend:  Recent Labs  Lab 04/27/23 1143 04/27/23 1608 04/27/23 1737 04/27/23 2105 04/28/23 0743 04/28/23 1233 04/28/23 1644  GLUCAP 78 92 90 190* 119* 180* 182*     HTN, refractory -Continue amlodipine and Coreg, hydralazine -Euvolemic, will hold off Lasix -Continue to Monitor BP per Protocol -Last BP reading was 144/76   COPD -Stable, continue Mometasone-Formoterol 200-5 mcg/ACT 2 puff IH BID and Albuterol 3 mL IH q6hprn Wheezing and SOB SpO2: 94 % O2 Flow Rate (L/min): 2 L/min -Continue to Monitor Respiratory Status Carefully  Constipation -Initiate Bowel Regimen continue senna docusate 1 tab p.o. twice daily, MiraLAX 17 g p.o. twice daily and bisacodyl 10 mg rectal suppository daily moderate constipation   CAD -No acute concerns -ASA 81 mg po Daily, Clopidogrel 75 mg po Daily and Atorvastatin 80 mg po Daily   Hypoalbuminemia -Patient's Albumin Trend: Recent Labs  Lab 04/24/23 0842 04/25/23 1119 04/26/23 0218 04/27/23 0940 04/28/23 0511  ALBUMIN 3.8 3.5 3.4* 3.4* 3.4*  -Continue to Monitor and Trend and repeat CMP in the AM  Glaucoma -C/w Brimonidine 1 drop Both Eyes TID and Latanoprost 1 drop Both Eyes qHS    DVT prophylaxis: heparin injection 5,000 Units Start: 04/24/23 2200    Code Status: Full Code Family Communication: No Family currently at bedside  Disposition Plan:  Level of care: Progressive Cardiac Status is: Inpatient Remains inpatient appropriate because: He is improving but still had  pain in his foot.  Will anticipate discharge in the next 24 to 48 hours if improved   Consultants:  Vascular surgery  Procedures:  Procedure Performed by Dr. Lemar Livings: 04/27/23 1.  Ultrasound-guided cannulation and Mynx device closure left common femoral artery 2.  Catheter in aorta and bilateral lower extremity runoff 3.  Selection with catheter of right popliteal artery 4.  6 x 60 mm shockwave lithotripsy right popliteal artery 5.  Drug-coated balloon angioplasty 6 x 40 mm Ranger right popliteal artery 6.  Moderate sedation with fentanyl and Versed for 69 minutes  Antimicrobials:  Anti-infectives (From admission,  onward)    Start     Dose/Rate Route Frequency Ordered Stop   04/24/23 1500  cefTRIAXone (ROCEPHIN) 2 g in sodium chloride 0.9 % 100 mL IVPB        2 g 200 mL/hr over 30 Minutes Intravenous  Once 04/24/23 1450 04/24/23 1952   04/24/23 1500  metroNIDAZOLE (FLAGYL) IVPB 500 mg        500 mg 100 mL/hr over 60 Minutes Intravenous  Once 04/24/23 1450 04/25/23 0817       Subjective: Seen and examined at bedside and was having quite a bit of pain in his foot still.  Also states that he has not had a bowel movement in over a week.  No nausea or vomiting.  Also was complaining that he felt weak and wanted to try and ambulate more.  No other concerns or complaints at this time.  Objective: Vitals:   04/28/23 0738 04/28/23 0839 04/28/23 1641 04/28/23 1650  BP: (!) 184/72 (!) 181/106 (!) 144/76   Pulse: 62  60   Resp:   18   Temp: 98 F (36.7 C)  98.6 F (37 C)   TempSrc: Oral  Oral   SpO2: 97%  94% 94%  Weight:      Height:        Intake/Output Summary (Last 24 hours) at 04/28/2023 1835 Last data filed at 04/28/2023 1700 Gross per 24 hour  Intake 2661.41 ml  Output 1050 ml  Net 1611.41 ml   Filed Weights   04/24/23 0824  Weight: 81.6 kg   Examination: Physical Exam:  Constitutional: WN/WD overweight African-American male who appears a little uncomfortable complaining of right foot pain Respiratory: Diminished to auscultation bilaterally, no wheezing, rales, rhonchi or crackles. Normal respiratory effort and patient is not tachypenic. No accessory muscle use.  Unlabored breathing Cardiovascular: RRR, no murmurs / rubs / gallops. S1 and S2 auscultated. No extremity edema. Abdomen: Soft, non-tender, very mildly distended secondary to body habitus. Bowel sounds positive.  GU: Deferred. Musculoskeletal: No clubbing / cyanosis of digits/nails. No joint deformity upper and lower extremities but has dry gangrene of the second and third toes Skin: Dry again during the second and third toes  with no appreciable rashes or other lesions noted Neurologic: CN 2-12 grossly intact with no focal deficits. Romberg sign and cerebellar reflexes not assessed.  Psychiatric: Normal judgment and insight. Alert and oriented x 3. Normal mood and appropriate affect.   Data Reviewed: I have personally reviewed following labs and imaging studies  CBC: Recent Labs  Lab 04/24/23 0842 04/25/23 1119 04/26/23 0218 04/27/23 0940 04/28/23 0511  WBC 6.7 7.2 7.7 6.5 8.1  NEUTROABS 3.7 3.7 3.8 3.6 4.8  HGB 13.6 13.4 12.6* 12.9* 12.7*  HCT 38.5* 36.9* 34.9* 35.0* 34.9*  MCV 77.9* 76.7* 76.4* 76.3* 77.2*  PLT 178 175 169 162 185   Basic Metabolic Panel:  Recent Labs  Lab 04/24/23 0842 04/25/23 1119 04/26/23 0218 04/27/23 0940 04/28/23 0511  NA 128* 131* 130* 134* 129*  K 4.0 3.8 3.7 3.9 4.7  CL 97* 101 100 103 99  CO2 20* 23 22 20* 19*  GLUCOSE 215* 198* 129* 93 153*  BUN 28* 31* 36* 23 27*  CREATININE 1.63* 2.06* 2.00* 1.59* 1.71*  CALCIUM 9.0 9.3 9.2 9.3 9.3  MG  --  1.5* 1.5* 1.8 2.3  PHOS  --  3.1 3.9 3.3 3.6   GFR: Estimated Creatinine Clearance: 46.5 mL/min (A) (by C-G formula based on SCr of 1.71 mg/dL (H)). Liver Function Tests: Recent Labs  Lab 04/24/23 0842 04/25/23 1119 04/26/23 0218 04/27/23 0940 04/28/23 0511  AST 28 23 21 25  35  ALT 23 20 20 23 25   ALKPHOS 71 59 57 59 60  BILITOT 0.4 0.4 0.5 0.6 1.0  PROT 6.3* 5.8* 5.8* 6.1* 5.9*  ALBUMIN 3.8 3.5 3.4* 3.4* 3.4*   No results for input(s): "LIPASE", "AMYLASE" in the last 168 hours. No results for input(s): "AMMONIA" in the last 168 hours. Coagulation Profile: No results for input(s): "INR", "PROTIME" in the last 168 hours. Cardiac Enzymes: No results for input(s): "CKTOTAL", "CKMB", "CKMBINDEX", "TROPONINI" in the last 168 hours. BNP (last 3 results) No results for input(s): "PROBNP" in the last 8760 hours. HbA1C: No results for input(s): "HGBA1C" in the last 72 hours. CBG: Recent Labs  Lab 04/27/23 1737  04/27/23 2105 04/28/23 0743 04/28/23 1233 04/28/23 1644  GLUCAP 90 190* 119* 180* 182*   Lipid Profile: Recent Labs    04/28/23 0511  CHOL 95  HDL 40*  LDLCALC 38  TRIG 84  CHOLHDL 2.4   Thyroid Function Tests: No results for input(s): "TSH", "T4TOTAL", "FREET4", "T3FREE", "THYROIDAB" in the last 72 hours. Anemia Panel: No results for input(s): "VITAMINB12", "FOLATE", "FERRITIN", "TIBC", "IRON", "RETICCTPCT" in the last 72 hours. Sepsis Labs: Recent Labs  Lab 04/24/23 0840 04/24/23 2002  LATICACIDVEN 1.0 0.7   No results found for this or any previous visit (from the past 240 hour(s)).   Radiology Studies: PERIPHERAL VASCULAR CATHETERIZATION  Result Date: 04/27/2023 Images from the original result were not included. Patient name: Tyriek Sedano MRN: [id number] DOB: February 17, 1961 Sex: male 04/27/2023 Pre-operative Diagnosis: Chronic right lower extremity limb threatening with toe ulceration Post-operative diagnosis:  Same Surgeon:  Apolinar Junes C. Randie Heinz, MD Procedure Performed: 1.  Ultrasound-guided cannulation and Mynx device closure left common femoral artery 2.  Catheter in aorta and bilateral lower extremity runoff 3.  Selection with catheter of right popliteal artery 4.  6 x 60 mm shockwave lithotripsy right popliteal artery 5.  Drug-coated balloon angioplasty 6 x 40 mm Ranger right popliteal artery 6.  Moderate sedation with fentanyl and Versed for 69 minutes Indications: 62 year old male with chronic right lower extremity limb threatening ischemia with dampened monophasic ABIs and 0 toe pressure on the right with a palpable popliteal pulse and nothing palpable below.  He is now indicated for angiography with possible intervention. Findings: His aorta and bilateral common external iliac arteries were patent as are the bilateral common femoral arteries.  He does have a steep aortic bifurcation.  The right SFA is calcified but there is no evidence of any stenosis.  Just below the knee joint  there is heavy calcification of the popliteal artery it is subtotally occluded.  After shockwave lithotripsy and drug-coated balloon angioplasty this is reduced to 0% stenosis where previously was greater than 90% stenosis with heavy  calcification.  The dominant runoff on the right side is via the peroneal artery which gives rise to the plantar vessels that are posterior tibial base.  There is posterior tibial artery runoff but this occludes above the ankle and the anterior tibial artery is occluded much higher in the leg.  At completion there is a multiphasic peroneal artery signal at the ankle.  Procedure:  The patient was identified in the holding area and taken to room 8.  The patient was then placed supine on the table and prepped and draped in the usual sterile fashion.  A time out was called.  Ultrasound was used to evaluate the left common femoral artery which was noted to be patent and the area was anesthetized with 1% lidocaine and cannulated with micropuncture needle followed by wire and a sheath.  And images saved to the permanent record.  Concomitantly we administered fentanyl and Versed for moderate sedation and his vital signs were monitored throughout the case.  We placed a Bentson wire followed by 5 [place] sheath and Omni catheter to the level of L1 and aortogram was performed followed by limited bilateral lower extremity angiography to evaluate the bilateral common femoral arteries bilateral common and external iliac arteries.  We then crossed the bifurcation using a Glidewire and the Omni catheter and perform right lower extremity angiography and then used a Glidewire and quick cross catheter to the level of the knee to perform angiography below the knee and identify the tibial runoff.  With the above findings we placed a long 6 French sheath over a Glidewire advantage the patient was fully heparinized.  We then used a quick cross catheter and Glidewire advantage were able to cross the subtotally  occluded popliteal artery and confirmed intraluminal access.  We placed an 014 wire and then performed lithotripsy with a shockwave 6 x 60 mm balloon for a total of 300 pulses.  Completion angiography demonstrated minimal residual stenosis and the calcium had significantly improved.  We then primarily performed drug-coated balloon angioplasty with a 6 x 40 mm Ranger at nominal pressure.  Completion demonstrated no residual stenosis no dissection's.  Runoff was via the peroneal artery there is possibly a 30% stenosis at the takeoff of the tibioperoneal trunk this is not treated.  We exchanged for a short 6 French sheath over a Bentson wire and deployed a minx device.  Patient tolerated procedure without any complication. Contrast: 105cc Brandon C. Randie Heinz, MD [facility] Office: [phone] Pager: [phone]   Scheduled Meds:  amLODipine  10 mg Oral Daily   aspirin EC  81 mg Oral Daily   atorvastatin  80 mg Oral Daily   brimonidine  1 drop Both Eyes TID   carvedilol  12.5 mg Oral BID WC   clopidogrel  75 mg Oral Q breakfast   heparin  5,000 Units Subcutaneous Q12H   hydrALAZINE  100 mg Oral BID   insulin aspart  0-9 Units Subcutaneous TID WC   insulin glargine-yfgn  25 Units Subcutaneous Q breakfast   isosorbide mononitrate  30 mg Oral Daily   latanoprost  1 drop Both Eyes QHS   linagliptin  5 mg Oral Daily   magnesium oxide  400 mg Oral Daily   mometasone-formoterol  2 puff Inhalation BID   polyethylene glycol  17 g Oral BID   senna-docusate  1 tablet Oral BID   sodium chloride flush  3 mL Intravenous Q12H   traZODone  75 mg Oral QHS  Continuous Infusions:  sodium chloride      LOS: 4 days   Marguerita Merles, DO [facility] Available via Epic secure chat 7am-7pm After these hours, please refer to coverage provider listed on [email or web address] 04/28/2023, 6:35 PM

## 2023-04-28 NOTE — Evaluation (Signed)
Physical Therapy Evaluation Patient Details Name: Jose [name] MRN: [id number] DOB: September 09, 1961 Today's Date: 04/28/2023  History of Present Illness  Pt is 62 year old presented to [facility] on  04/24/23 for rt foot pain. Pt with dry gangrene of rt 2nd and 3rd toes. Underwent angiogram with shockwave lithotripsy rt popliteal artery and popliteal angioplasty. PMH - CAD, CKD, HTN, DM, Copd  Clinical Impression  Pt doing well with mobility and no further PT needed.  Ready for dc from PT standpoint.        Recommendations for follow up therapy are one component of a multi-disciplinary discharge planning process, led by the attending physician.  Recommendations may be updated based on patient status, additional functional criteria and insurance authorization.  Follow Up Recommendations       Assistance Recommended at Discharge PRN  Patient can return home with the following       Equipment Recommendations  ([facility])  Recommendations for Other Services       Functional Status Assessment Patient has not had a recent decline in their functional status     Precautions / Restrictions Precautions Precautions: None      Mobility  Bed Mobility Overal bed mobility: Independent                  Transfers Overall transfer level: Modified independent Equipment used: Rolling walker (2 wheels)                    Ambulation/Gait Ambulation/Gait assistance: Modified independent (Device/Increase time) Gait Distance (Feet): 220 Feet Assistive device: Rolling walker (2 wheels), None Gait Pattern/deviations: Decreased stance time - right, Step-through pattern, Decreased stride length Gait velocity: decr Gait velocity interpretation: 1.31 - 2.62 ft/sec, indicative of limited community ambulator   General Gait Details: Steady gait with and without walker. Used walker to decr pain  Stairs            Wheelchair Mobility    Modified  Rankin (Stroke Patients Only)       Balance Overall balance assessment: No apparent balance deficits (not formally assessed)                                           Pertinent Vitals/Pain Pain Assessment Pain Assessment: 0-10 Pain Score: 8  Pain Location: rt foot Pain Descriptors / Indicators: Aching Pain Intervention(s): Limited activity within patient's tolerance, Patient requesting pain meds-RN notified    Home Living Family/patient expects to be discharged to:: Private residence Living Arrangements: Alone Available Help at Discharge: Friend(s);Available PRN/intermittently Type of Home: Apartment Home Access: Stairs to enter Entrance Stairs-Rails: Right Entrance Stairs-Number of Steps: 2-3   Home Layout: One level Home Equipment: None      Prior Function Prior Level of Function : Independent/Modified Independent             Mobility Comments: No assistive device       Hand Dominance   Dominant Hand: Right    Extremity/Trunk Assessment   Upper Extremity Assessment Upper Extremity Assessment: Overall WFL for tasks assessed    Lower Extremity Assessment Lower Extremity Assessment: Overall WFL for tasks assessed       Communication   Communication: No difficulties  Cognition Arousal/Alertness: Awake/alert Behavior During Therapy: WFL for tasks assessed/performed Overall Cognitive Status: Within Functional Limits for tasks assessed  General Comments      Exercises     Assessment/Plan    PT Assessment Patient does not need any further PT services  PT Problem List         PT Treatment Interventions      PT Goals (Current goals can be found in the Care Plan section)  Acute Rehab PT Goals PT Goal Formulation: All assessment and education complete, DC therapy    Frequency       Co-evaluation               AM-PAC PT "6 Clicks" Mobility  Outcome Measure  Help needed turning from your back to your side while in a flat bed without using bedrails?: None Help needed moving from lying on your back to sitting on the side of a flat bed without using bedrails?: None Help needed moving to and from a bed to a chair (including a wheelchair)?: None Help needed standing up from a chair using your arms (e.g., wheelchair or bedside chair)?: None Help needed to walk in hospital room?: None Help needed climbing 3-5 steps with a railing? : None 6 Click Score: 24    End of Session   Activity Tolerance: Patient tolerated treatment well Patient left: in bed;with call bell/phone within reach Nurse Communication: Patient requests pain meds PT Visit Diagnosis: Pain Pain - Right/Left: Right Pain - part of body: Ankle and joints of foot    Time: 4010-2725 PT Time Calculation (min) (ACUTE ONLY): 16 min   Charges:   PT Evaluation $PT Eval Low Complexity: 1 Low          [facility] PT Acute Rehabilitation Services Office [phone]   Angelina Ok [facility] 04/28/2023, 3:43 PM

## 2023-04-28 NOTE — Progress Notes (Addendum)
[facility] [place]  Subjective  - No new complaints   Objective (!) 152/79 62 98.7 F (37.1 C) (Oral) 16 98%  Intake/Output Summary (Last 24 hours) at 04/28/2023 0656 Last data filed at 04/28/2023 0400 Gross per 24 hour  Intake 1738.64 ml  Output 1125 ml  Net 613.64 ml    Feet warm without frank ischemic skin changes Left groin soft Peroneal doppler signal intact right LE  Assessment/Planning: POD # 1 s/p angiogram with 6 x 60 mm shockwave lithotripsy right popliteal artery and popliteal angioplasty.  He has peroneal runoff multiphasic.  He is stable from a vascular pont of view.   F/U arranged with [facility]/Dr. Chestine Spore 4 weeks with duplex and ABI    Mosetta Pigeon 04/28/2023 6:56 AM --  Laboratory Lab Results: Recent Labs    04/27/23 0940 04/28/23 0511  WBC 6.5 8.1  HGB 12.9* 12.7*  HCT 35.0* 34.9*  PLT 162 185   BMET Recent Labs    04/27/23 0940 04/28/23 0511  NA 134* 129*  K 3.9 4.7  CL 103 99  CO2 20* 19*  GLUCOSE 93 153*  BUN 23 27*  CREATININE 1.59* 1.71*  CALCIUM 9.3 9.3    COAG Lab Results  Component Value Date   INR 1.1 01/27/2022   INR 1.04 08/29/2017   INR 1.00 10/17/2015   No results found for: "PTT"   I have seen and evaluated the patient. I agree with the PA note as documented above.  Postprocedure day 1 status post shockwave lithotripsy and balloon angioplasty of right popliteal artery for high-grade stenosis.  Left groin access site looks great.  Brisk peroneal signal at the right ankle.  States his foot feels a lot better.  I think his toe tissue loss will likely heal with time.  Will allow time to demarcate.  Follow-up in 1 month in the office with noninvasive imaging.  Aspirin statin Plavix from our standpoint okay for discharge.  Cephus Shelling, MD [facility] [place] Office: [phone]

## 2023-04-28 NOTE — Discharge Instructions (Signed)
° °  [facility] ° °Discharge Instructions ° °Lower Extremity Angiogram; Angioplasty/Stenting ° °Please refer to the following instructions for your post-procedure care. Your surgeon or physician assistant will discuss any changes with you. ° °Activity ° °Avoid lifting more than 8 pounds (1 gallons of milk) for 72 hours (3 days) after your procedure. You may walk as much as you can tolerate. It's OK to drive after 72 hours. ° °Bathing/Showering ° °You may shower the day after your procedure. If you have a bandage, you may remove it at 24- 48 hours. Clean your incision site with mild soap and water. Pat the area dry with a clean towel. ° °Diet ° °Resume your pre-procedure diet. There are no special food restrictions following this procedure. All patients with peripheral vascular disease should follow a low fat/low cholesterol diet. In order to heal from your surgery, it is CRITICAL to get adequate nutrition. Your body requires vitamins, minerals, and protein. Vegetables are the best source of vitamins and minerals. Vegetables also provide the perfect balance of protein. Processed food has little nutritional value, so try to avoid this. ° °Medications ° °Resume taking all of your medications unless your doctor tells you not to. If your incision is causing pain, you may take over-the-counter pain relievers such as acetaminophen (Tylenol) ° °Follow Up ° °Follow up will be arranged at the time of your procedure. You may have an office visit scheduled or may be scheduled for surgery. Ask your surgeon if you have any questions. ° °Please call us immediately for any of the following conditions: °•Severe or worsening pain your legs or feet at rest or with walking. °•Increased pain, redness, drainage at your groin puncture site. °•Fever of 101 degrees or higher. °•If you have any mild or slow bleeding from your puncture site: lie down, apply firm constant pressure over the area with a piece of  gauze or a clean wash cloth for 30 minutes- no peeking!, call 911 right away if you are still bleeding after 30 minutes, or if the bleeding is heavy and unmanageable. ° °Reduce your risk factors of vascular disease: ° °Stop smoking. If you would like help call [facility] at 1-800-QUIT-NOW (1-800-784-8669) or [place] at 336-586-4000. °Manage your cholesterol °Maintain a desired weight °Control your diabetes °Keep your blood pressure down ° °If you have any questions, please call the office at [phone] ° °

## 2023-04-28 NOTE — Progress Notes (Signed)
OT Cancellation Note and Discharge  Patient Details Name: Berthold Walther MRN: [id number] DOB: [birth year]   Cancelled Treatment:    Reason Eval/Treat Not Completed: OT screened, no needs identified, will sign off. Received secure chat from evaluating PT [facility]) that there were no skilled OT needs identified during her evaluation with patient.  Lindon Romp OT Acute Rehabilitation Services Office [phone]    Evette Georges 04/28/2023, 4:18 PM

## 2023-04-29 DIAGNOSIS — I96 Gangrene, not elsewhere classified: Secondary | ICD-10-CM | POA: Diagnosis not present

## 2023-04-29 LAB — GLUCOSE, CAPILLARY
Glucose-Capillary: 182 mg/dL — ABNORMAL HIGH (ref 70–99)
Glucose-Capillary: 184 mg/dL — ABNORMAL HIGH (ref 70–99)
Glucose-Capillary: 190 mg/dL — ABNORMAL HIGH (ref 70–99)
Glucose-Capillary: 118 mg/dL — ABNORMAL HIGH (ref 70–99)

## 2023-04-29 LAB — COMPREHENSIVE METABOLIC PANEL
ALT: 26 U/L (ref 0–44)
AST: 24 U/L (ref 15–41)
Albumin: 3.4 g/dL — ABNORMAL LOW (ref 3.5–5.0)
Alkaline Phosphatase: 62 U/L (ref 38–126)
Anion gap: 9 (ref 5–15)
BUN: 29 mg/dL — ABNORMAL HIGH (ref 8–23)
CO2: 21 mmol/L — ABNORMAL LOW (ref 22–32)
Calcium: 9.3 mg/dL (ref 8.9–10.3)
Chloride: 101 mmol/L (ref 98–111)
Creatinine, Ser: 1.82 mg/dL — ABNORMAL HIGH (ref 0.61–1.24)
GFR, Estimated: 41 mL/min — ABNORMAL LOW (ref >60)
Glucose, Bld: 184 mg/dL — ABNORMAL HIGH (ref 70–99)
Potassium: 4.5 mmol/L (ref 3.5–5.1)
Sodium: 131 mmol/L — ABNORMAL LOW (ref 135–145)
Total Bilirubin: 0.6 mg/dL (ref 0.3–1.2)
Total Protein: 6.2 g/dL — ABNORMAL LOW (ref 6.5–8.1)

## 2023-04-29 LAB — CBC WITH DIFFERENTIAL/PLATELET
Abs Immature Granulocytes: 0.02 10*3/uL (ref 0.00–0.07)
Basophils Absolute: 0 10*3/uL (ref 0.0–0.1)
Basophils Relative: 0 %
Eosinophils Absolute: 0.3 10*3/uL (ref 0.0–0.5)
Eosinophils Relative: 3 %
HCT: 35 % — ABNORMAL LOW (ref 39.0–52.0)
Hemoglobin: 12.5 g/dL — ABNORMAL LOW (ref 13.0–17.0)
Immature Granulocytes: 0 %
Lymphocytes Relative: 29 %
Lymphs Abs: 2.4 10*3/uL (ref 0.7–4.0)
MCH: 27.7 pg (ref 26.0–34.0)
MCHC: 35.7 g/dL (ref 30.0–36.0)
MCV: 77.4 fL — ABNORMAL LOW (ref 80.0–100.0)
Monocytes Absolute: 0.7 10*3/uL (ref 0.1–1.0)
Monocytes Relative: 9 %
Neutro Abs: 5 10*3/uL (ref 1.7–7.7)
Neutrophils Relative %: 59 %
Platelets: 170 10*3/uL (ref 150–400)
RBC: 4.52 MIL/uL (ref 4.22–5.81)
RDW: 13.4 % (ref 11.5–15.5)
WBC: 8.5 10*3/uL (ref 4.0–10.5)
nRBC: 0 % (ref 0.0–0.2)

## 2023-04-29 LAB — MAGNESIUM: Magnesium: 2.1 mg/dL (ref 1.7–2.4)

## 2023-04-29 LAB — PHOSPHORUS: Phosphorus: 3.7 mg/dL (ref 2.5–4.6)

## 2023-04-29 MED ORDER — PANTOPRAZOLE SODIUM 40 MG PO TBEC
40.0000 mg | DELAYED_RELEASE_TABLET | Freq: Two times a day (BID) | ORAL | Status: DC
  Administered 2023-04-29 – 2023-04-30 (×2): 40 mg via ORAL
  Filled 2023-04-29 (×2): qty 1

## 2023-04-29 MED ORDER — VITAMIN B-12 100 MCG PO TABS
500.0000 ug | ORAL_TABLET | Freq: Every day | ORAL | Status: DC
  Administered 2023-04-30: 500 ug via ORAL
  Filled 2023-04-29 (×2): qty 5

## 2023-04-29 MED ORDER — GABAPENTIN 300 MG PO CAPS
300.0000 mg | ORAL_CAPSULE | Freq: Every evening | ORAL | Status: DC
  Administered 2023-04-29: 300 mg via ORAL
  Filled 2023-04-29: qty 1

## 2023-04-29 MED ORDER — OXYCODONE HCL 5 MG PO TABS
5.0000 mg | ORAL_TABLET | ORAL | Status: DC | PRN
  Administered 2023-04-29 – 2023-04-30 (×3): 5 mg via ORAL
  Filled 2023-04-29 (×4): qty 1

## 2023-04-29 NOTE — Progress Notes (Signed)
PROGRESS NOTE Jose [name]  WJX:[id number] DOB: 1961-08-14 DOA: 04/24/2023 [facility]CP: [name]  Brief Narrative/Hospital Course: 62 y.o.m w/ CAD status post stenting x 2, CKD stage II, COPD, IDDM, HTN, came with worsening of right foot pain on 5/31,  w/ symptoms started 4-5 weeks ago, also some epigastric area discomfort since past week.  Seen by PCP recently and placed on oxycodone In OZ:HYQMVHQI, nontachycardic nonhypotensive nonhypoxic.CTA showed diffuse atherosclerotic calcification of arteries and lower extremities, moderate to severe focal stenosis of the right popliteal artery.  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  AST ALT bilirubin within normal limits.  Creatinine 1.6 about his baseline, glucose 215 sodium 128 compared to baseline 126-132. Patient was placed on multiple pain regimen, vascular surgery consulted and admitted on IV antibiotics  ABI abnormal, status post a 6 x 6 mm shockwave lithotripsy of the left popliteal artery and popliteal angioplasty> managed on Aspirin Plavix statin.  LDL showed 38 cholesterol HDL 40. Patient also had AKI CKD stage IIIb with metabolic acidosis, creatinine remained stable  in peaked to 2.0 1/CHF acute on chronic hyponatremia, diabetes with hyperglycemia hypertension CAD COPD, patient continue home regimen       Subjective: Patient seen and examined this morning. Still complains of ongoing pain, would like to work with PT more before discharge.   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  LDL showed 38 cholesterol HDL 40. Overall risk of losing the right 2rd and 3rd toes is high, patient made aware.  Follow-up outpatient vascular surgery in a month with noninvasive studies currently allowing the area to demarcate.   Incidental finding of Pancreatic Tail Mass:suspect pancreatic cancer.CA19-9 was 13.Discussed with patient regarding the incidental CT finding, patient agreed with outpatient follow-up with contrast VOA for outpatient MRCP.   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  Weaned off IV fluids.  Monitor renal function closely follow-up outpatient Recent Labs    08/12/22 1007 08/12/22 2320 09/23/22 1138 10/10/22 2034 04/24/23 0842 04/25/23 1119 04/26/23 0218 04/27/23 0940 04/28/23 0511 04/29/23 0240  BUN 23 30* 20 23 28* 31* 36* 23 27* 29*  CREATININE 1.66* 2.09* 1.86* 1.85* 1.63* 2.06* 2.00* 1.59* 1.71* 1.82*  CO2 21* 17* 18* 20* 20* 23 22 20* 19* 21*    Hypomagnesemia: Resolved.    Acute on Chronic Hyponatremia: Sodium overall stable in 130 low side, off IV fluids encourage p.o. follow-up with PCP  IDDM with Hyperglycemia: Managed with long-acting insulin SSI last HbA1c 7.7 NOW. FU pcp  HTN, refractory:Continue amlodipine and Coreg, hydralazine.  BP currently well-controlled   COPD:Stable, continue Mometasone-Formoterol 200-5 mcg/ACT 2 puff IH BID and Albuterol 3 mL IH q6hprn Wheezing and SOB SpO2: 95 % O2 Flow Rate (L/min): 2 L/min  Constipation:Initiate Bowel Regimen continue  senna docusate 1 tab p.o. twice daily, MiraLAX 17 g p.o. twice daily and bisacodyl 10 mg rectal suppository daily moderate constipation.  No bowel meant for several days, add enema x 1   CAD:No  acute concerns. Cont ASA 81 mg po Daily, Clopidogrel 75 mg po Daily and Atorvastatin 80 mg po Daily   Hypoalbuminemia-augment diet  Glaucoma:C/w Brimonidine 1 drop Both Eyes TID and Latanoprost 1 drop Both Eyes qHS   DVT prophylaxis: heparin injection 5,000 Units Start: 04/24/23 2200 Code Status:   Code Status: Full Code Family Communication: plan of care discussed with patient at bedside. Patient status is:  inpatient  because of uncontrolled pain Level of care: Progressive Cardiac   Dispo: The patient is from: home            Anticipated disposition: home tomorrow Objective: Vitals last 24 hrs: Vitals:   04/28/23 2206 04/29/23 0440 04/29/23 0817 04/29/23 1141  BP: 134/83 (!) 148/79 139/70 134/72  Pulse:    61  Resp:  17  16  Temp:    98.7 F (37.1 C)  TempSrc:    Oral  SpO2:    98%  Weight:      Height:       Weight change:   Physical Examination: General exam: alert awake, older than stated age HEENT:Oral mucosa moist, Ear/Nose WNL grossly Respiratory system: bilaterally clear BS, no use of accessory muscle Cardiovascular system: S1 & S2 +, No JVD. Gastrointestinal system: Abdomen soft,NT,ND, BS+ Nervous System:Alert, awake, moving extremities. Extremities: LE edema neg, right foot with area of blackish discoloration in the toes see picture-unchanged from admission Skin: No rashes,no icterus. MSK: Normal muscle bulk,tone, power     Medications reviewed:  Scheduled Meds:  amLODipine  10 mg Oral Daily   aspirin EC  81 mg Oral Daily   atorvastatin  80 mg Oral Daily   brimonidine  1 drop Both Eyes TID   carvedilol  12.5 mg Oral BID WC   clopidogrel  75 mg Oral Q breakfast   heparin  5,000 Units Subcutaneous Q12H   hydrALAZINE  100 mg Oral BID   insulin aspart  0-9 Units Subcutaneous TID WC   insulin glargine-yfgn  25 Units Subcutaneous Q breakfast   isosorbide mononitrate  30 mg Oral Daily   latanoprost  1 drop Both Eyes QHS   linagliptin  5 mg Oral Daily    magnesium oxide  400 mg Oral Daily   mometasone-formoterol  2 puff Inhalation BID   polyethylene glycol  17 g Oral BID   senna-docusate  1 tablet Oral BID   sodium chloride flush  3 mL Intravenous Q12H   traZODone  75 mg Oral QHS   Continuous Infusions:  sodium chloride        Diet Order             Diet regular Room service appropriate? Yes; Fluid consistency: Thin  Diet effective now                   Intake/Output Summary (Last 24 hours) at 04/29/2023 1300 Last data filed at 04/28/2023 2209 Gross per 24 hour  Intake 123 ml  Output 675 ml  Net -552 ml   Net IO Since Admission: -622.8 mL [04/29/23 1300]  Wt Readings from Last 3 Encounters:  04/24/23 81.6 kg  10/10/22 83.9 kg  09/23/22 83 kg     Unresulted Labs (From admission, onward)     Start     Ordered  04/28/23 0500  CBC  Tomorrow morning,   R        04/27/23 1734           Data Reviewed: I have personally reviewed following labs and imaging studies CBC: Recent Labs  Lab 04/25/23 1119 04/26/23 0218 04/27/23 0940 04/28/23 0511 04/29/23 0240  WBC 7.2 7.7 6.5 8.1 8.5  NEUTROABS 3.7 3.8 3.6 4.8 5.0  HGB 13.4 12.6* 12.9* 12.7* 12.5*  HCT 36.9* 34.9* 35.0* 34.9* 35.0*  MCV 76.7* 76.4* 76.3* 77.2* 77.4*  PLT 175 169 162 185 170   Basic Metabolic Panel: Recent Labs  Lab 04/25/23 1119 04/26/23 0218 04/27/23 0940 04/28/23 0511 04/29/23 0240  NA 131* 130* 134* 129* 131*  K 3.8 3.7 3.9 4.7 4.5  CL 101 100 103 99 101  CO2 23 22 20* 19* 21*  GLUCOSE 198* 129* 93 153* 184*  BUN 31* 36* 23 27* 29*  CREATININE 2.06* 2.00* 1.59* 1.71* 1.82*  CALCIUM 9.3 9.2 9.3 9.3 9.3  MG 1.5* 1.5* 1.8 2.3 2.1  PHOS 3.1 3.9 3.3 3.6 3.7   GFR: Estimated Creatinine Clearance: 43.7 mL/min (A) (by C-G formula based on SCr of 1.82 mg/dL (H)). Liver Function Tests: Recent Labs  Lab 04/25/23 1119 04/26/23 0218 04/27/23 0940 04/28/23 0511 04/29/23 0240  AST 23 21 25  35 24  ALT 20 20 23 25 26   ALKPHOS 59 57 59  60 62  BILITOT 0.4 0.5 0.6 1.0 0.6  PROT 5.8* 5.8* 6.1* 5.9* 6.2*  ALBUMIN 3.5 3.4* 3.4* 3.4* 3.4*   No results for input(s): "LIPASE", "AMYLASE" in the last 168 hours. No results for input(s): "AMMONIA" in the last 168 hours. Coagulation Profile: No results for input(s): "INR", "PROTIME" in the last 168 hours. BNP (last 3 results) No results for input(s): "PROBNP" in the last 8760 hours. HbA1C: No results for input(s): "HGBA1C" in the last 72 hours. CBG: Recent Labs  Lab 04/28/23 1233 04/28/23 1644 04/28/23 2147 04/29/23 0736 04/29/23 1139  GLUCAP 180* 182* 172* 182* 184*   Lipid Profile: Recent Labs    04/28/23 0511  CHOL 95  HDL 40*  LDLCALC 38  TRIG 84  CHOLHDL 2.4   Thyroid Function Tests: No results for input(s): "TSH", "T4TOTAL", "FREET4", "T3FREE", "THYROIDAB" in the last 72 hours. Sepsis Labs: Recent Labs  Lab 04/24/23 0840 04/24/23 2002  LATICACIDVEN 1.0 0.7    No results found for this or any previous visit (from the past 240 hour(s)).  Antimicrobials: Anti-infectives (From admission, onward)    Start     Dose/Rate Route Frequency Ordered Stop   04/24/23 1500  cefTRIAXone (ROCEPHIN) 2 g in sodium chloride 0.9 % 100 mL IVPB        2 g 200 mL/hr over 30 Minutes Intravenous  Once 04/24/23 1450 04/24/23 1952   04/24/23 1500  metroNIDAZOLE (FLAGYL) IVPB 500 mg        500 mg 100 mL/hr over 60 Minutes Intravenous  Once 04/24/23 1450 04/25/23 0817      Culture/Microbiology    Component Value Date/Time   SDES URINE, RANDOM 07/29/2009 0135   SPECREQUEST IMMUNE:NORM UT SYMPT:NEG 07/29/2009 0135   CULT NO GROWTH 07/29/2009 0135   REPTSTATUS 07/30/2009 FINAL 07/29/2009 0135  Other culture-see note  Radiology Studies: PERIPHERAL VASCULAR CATHETERIZATION  Result Date: 04/27/2023 Images from the original result were not included. Patient name: Jose [name] MRN: [id number] DOB: June 28, 1961 Sex: male 04/27/2023 Pre-operative Diagnosis: Chronic right lower  extremity limb threatening with toe ulceration Post-operative  diagnosis:  Same Surgeon:  Luanna Salk. Randie Heinz, MD Procedure Performed: 1.  Ultrasound-guided cannulation and Mynx device closure left common femoral artery 2.  Catheter in aorta and bilateral lower extremity runoff 3.  Selection with catheter of right popliteal artery 4.  6 x 60 mm shockwave lithotripsy right popliteal artery 5.  Drug-coated balloon angioplasty 6 x 40 mm Ranger right popliteal artery 6.  Moderate sedation with fentanyl and Versed for 69 minutes Indications: 62 year old male with chronic right lower extremity limb threatening ischemia with dampened monophasic ABIs and 0 toe pressure on the right with a palpable popliteal pulse and nothing palpable below.  He is now indicated for angiography with possible intervention. Findings: His aorta and bilateral common external iliac arteries were patent as are the bilateral common femoral arteries.  He does have a steep aortic bifurcation.  The right SFA is calcified but there is no evidence of any stenosis.  Just below the knee joint there is heavy calcification of the popliteal artery it is subtotally occluded.  After shockwave lithotripsy and drug-coated balloon angioplasty this is reduced to 0% stenosis where previously was greater than 90% stenosis with heavy calcification.  The dominant runoff on the right side is via the peroneal artery which gives rise to the plantar vessels that are posterior tibial base.  There is posterior tibial artery runoff but this occludes above the ankle and the anterior tibial artery is occluded much higher in the leg.  At completion there is a multiphasic peroneal artery signal at the ankle.  Procedure:  The patient was identified in the holding area and taken to room 8.  The patient was then placed supine on the table and prepped and draped in the usual sterile fashion.  A time out was called.  Ultrasound was used to evaluate the left common femoral artery which was  noted to be patent and the area was anesthetized with 1% lidocaine and cannulated with micropuncture needle followed by wire and a sheath.  And images saved to the permanent record.  Concomitantly we administered fentanyl and Versed for moderate sedation and his vital signs were monitored throughout the case.  We placed a Bentson wire followed by 5 [place] sheath and Omni catheter to the level of L1 and aortogram was performed followed by limited bilateral lower extremity angiography to evaluate the bilateral common femoral arteries bilateral common and external iliac arteries.  We then crossed the bifurcation using a Glidewire and the Omni catheter and perform right lower extremity angiography and then used a Glidewire and quick cross catheter to the level of the knee to perform angiography below the knee and identify the tibial runoff.  With the above findings we placed a long 6 French sheath over a Glidewire advantage the patient was fully heparinized.  We then used a quick cross catheter and Glidewire advantage were able to cross the subtotally occluded popliteal artery and confirmed intraluminal access.  We placed an 014 wire and then performed lithotripsy with a shockwave 6 x 60 mm balloon for a total of 300 pulses.  Completion angiography demonstrated minimal residual stenosis and the calcium had significantly improved.  We then primarily performed drug-coated balloon angioplasty with a 6 x 40 mm Ranger at nominal pressure.  Completion demonstrated no residual stenosis no dissection's.  Runoff was via the peroneal artery there is possibly a 30% stenosis at the takeoff of the tibioperoneal trunk this is not treated.  We exchanged for a short 6 [place] sheath over a Bentson  wire and deployed a minx device.  Patient tolerated procedure without any complication. Contrast: 105cc Brandon C. Randie Heinz, MD [facility] Office: [phone] Pager: [phone]    LOS: 5 days   Lanae Boast,  MD [facility]  04/29/2023, 1:00 PM

## 2023-04-29 NOTE — TOC Initial Note (Signed)
Transition of Care [facility]) - Initial/Assessment Note    Patient Details  Name: Jose [name] MRN: [id number] Date of Birth: 11-23-1961  Transition of [facility]) CM/SW Contact:    Gala Lewandowsky, RN Phone Number: 04/29/2023, 1:12 PM  Clinical Narrative:  Patient presented for right foot pain-dry gangrene on right second and third toes-post angiogram. PTA patient was from home alone. Patient states his PCP is with the [facility]. Patient has transportation to appointments via the [facility]. Patient states if he needs additional transportation he calls [employer]. Patient is in need of a rolling walker- he states cane has been ordered via CSW at the [place]. Case Manager submitted information to the [facility] for rolling walker. DME should be shipped to the patients apartment. No further home needs identified.                 Expected Discharge Plan: Home/Self Care Barriers to Discharge: No Barriers Identified   Patient Goals and CMS Choice Patient states their goals for this hospitalization and ongoing recovery are:: to return home.   Choice offered to / list presented to : Parent, NA      Expected Discharge Plan and Services In-house Referral: NA Discharge Planning Services: CM Consult Post Acute Care Choice: NA Living arrangements for the past 2 months: Apartment                 DME Arranged: Walker rolling DME Agency: [facility], [place] Date DME Agency Contacted: 04/29/23 Time DME Agency Contacted: 1311 Representative spoke with at DME Agency: [facility] Arranged: NA    Prior Living Arrangements/Services Living arrangements for the past 2 months: Apartment Lives with:: Self Patient language and need for interpreter reviewed:: Yes        Need for Family Participation in Patient Care: Yes (Comment) Care giver support system in place?: Yes (comment)   Criminal Activity/Legal Involvement Pertinent to Current  Situation/Hospitalization: No - Comment as needed  Activities of Daily Living Home Assistive Devices/Equipment: None ADL Screening (condition at time of admission) Patient's cognitive ability adequate to safely complete daily activities?: Yes Is the patient deaf or have difficulty hearing?: No Does the patient have difficulty seeing, even when wearing glasses/contacts?: No Does the patient have difficulty concentrating, remembering, or making decisions?: No Patient able to express need for assistance with ADLs?: Yes Does the patient have difficulty dressing or bathing?: No Independently performs ADLs?: No Communication: Independent Dressing (OT): Independent Grooming: Independent Feeding: Independent Bathing: Independent Toileting: Independent In/Out Bed: Independent Walks in Home: Independent Does the patient have difficulty walking or climbing stairs?: Yes Weakness of Legs: Right Weakness of Arms/Hands: None  Permission Sought/Granted Permission sought to share information with : Case Manager, Family Supports       [facility] to [facility]        Emotional Assessment Appearance:: Appears stated age Attitude/Demeanor/Rapport: Engaged Affect (typically observed): Appropriate Orientation: : Oriented to Self, Oriented to Place, Oriented to  Time, Oriented to Situation Alcohol / Substance Use: Not Applicable Psych Involvement: No (comment)  Admission diagnosis:  Gangrene of right foot (HCC) [I96] Critical limb ischemia of right lower extremity (HCC) [I70.221] Patient Active Problem List   Diagnosis Date Noted   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) 04/24/2023   Gangrene of right foot (HCC) 04/24/2023   PTSD (post-traumatic stress disorder) 08/14/2022   Nightmares associated with chronic post-traumatic stress disorder 08/14/2022   Major depressive disorder,  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/11/2022   Suicidal ideation  08/11/2022   Abnormality of aortic valve 01/29/2022   Bradycardia 01/29/2022   Hypokalemia 01/29/2022   Unstable angina (HCC) 01/27/2022   Hypertensive urgency 02/11/2021   Chest pain 02/11/2021   NSTEMI (non-ST elevated myocardial infarction) (HCC) 08/22/2020   Acute coronary syndrome (HCC) 08/21/2020   Hypertensive emergency 08/21/2020   Depression    Elevated troponin    Nausea and vomiting    Suspected COVID-19 virus infection    Diarrhea 04/27/2019   Hypertensive retinopathy of both eyes 02/01/2019   Chest pain with moderate risk for cardiac etiology 08/30/2017   Chest pain with high risk for cardiac etiology 08/28/2017   CKD (chronic kidney disease) stage 3, GFR 30-59 ml/min 08/28/2017   COPD (chronic obstructive pulmonary disease) (HCC) 08/28/2017   CAD (coronary artery disease) 04/10/2016   Hyperlipidemia 04/10/2016   Benign prostatic hyperplasia 04/10/2016   Diabetes (HCC) 04/10/2016   Hyperglycemia 10/17/2015   Type 2 diabetes mellitus with complication, with long-term current use of insulin (HCC) 10/17/2015   Acute renal failure superimposed on stage 3 chronic kidney disease (HCC) 10/17/2015   Hyperkalemia 10/17/2015   Dyslipidemia associated with type 2 diabetes mellitus (HCC) 10/17/2015   Alcohol abuse    Cocaine abuse (HCC) 06/24/2015   Hyponatremia 05/12/2015   Hypertension    PCP:  [name] Pharmacy:   [facility] - [place], [place] - [address] [place] [address] Phone: [phone] Fax: [phone]  [facility] [phone] - [place], [place] - [address] AT [facility] [address] [place] [address] Phone: [phone] Fax: [phone]  Social Determinants of Health (SDOH) Social History: SDOH Screenings   Food Insecurity: No Food Insecurity (07/25/2022)  Transportation Needs: No Transportation Needs  (07/25/2022)  Depression (PHQ2-9): High Risk (08/11/2022)  Tobacco Use: Medium Risk (04/28/2023)  Readmission Risk Interventions    04/29/2023    1:09 PM 01/29/2022    2:46 PM 08/24/2020    2:21 PM  Readmission Risk Prevention Plan  Transportation Screening Complete Complete Complete  PCP or Specialist Appt within 3-5 Days  Complete Complete  HRI or Home Care Consult  Complete Complete  Social Work Consult for Recovery Care Planning/Counseling  Complete Complete  Palliative Care Screening  Not Applicable Not Applicable  Medication Review Oceanographer) Complete Complete Complete  HRI or Home Care Consult Complete    SW Recovery Care/Counseling Consult Complete    Palliative Care Screening Not Applicable    Skilled Nursing Facility Not Applicable

## 2023-04-29 NOTE — Progress Notes (Signed)
Mobility Specialist Progress Note:   04/29/23 1506  Mobility  Activity Ambulated independently in hallway  Level of Assistance Modified independent, requires aide device or extra time  Assistive Device Front wheel walker  Distance Ambulated (ft) 400 ft  Activity Response Tolerated well  Mobility Referral Yes  $Mobility charge 1 Mobility  Mobility Specialist Start Time (ACUTE ONLY) 1506  Mobility Specialist Stop Time (ACUTE ONLY) 1523  Mobility Specialist Time Calculation (min) (ACUTE ONLY) 17 min   Pt eager for mobility session. Required no physical assistance throughout. C/o 6/10 R foot pain throughout session. Back in bed with all needs met.   Addison Lank Mobility Specialist Please contact via SecureChat or  Rehab office at [phone]

## 2023-04-29 NOTE — Progress Notes (Signed)
Notified provider of need for better pain control.

## 2023-04-29 NOTE — Discharge Summary (Deleted)
PROGRESS NOTE Jose [name]  NWG:[id number] DOB: 01-May-1961 DOA: 04/24/2023 PCP: [name]  Brief Narrative/Hospital Course: 62 y.o.m w/ CAD status post stenting x 2, CKD stage II, COPD, IDDM, HTN, came with worsening of right foot pain on 5/31,  w/ symptoms started 4-5 weeks ago, also some epigastric area discomfort since past week.  Seen by PCP recently and placed on oxycodone In VH:QIONGEXB, nontachycardic nonhypotensive nonhypoxic.CTA showed diffuse atherosclerotic calcification of arteries and lower extremities, moderate to severe focal stenosis of the right popliteal artery.  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  AST ALT bilirubin within normal limits.  Creatinine 1.6 about his baseline, glucose 215 sodium 128 compared to baseline 126-132. Patient was placed on multiple pain regimen, vascular surgery consulted and admitted on IV antibiotics  ABI abnormal, status post a 6 x 6 mm shockwave lithotripsy of the left popliteal artery and popliteal angioplasty> managed on Aspirin Plavix statin.  LDL showed 38 cholesterol HDL 40. Patient also had AKI CKD stage IIIb with metabolic acidosis, creatinine remained stable  in peaked to 2.0 1/CHF acute on chronic hyponatremia, diabetes with hyperglycemia hypertension CAD COPD, patient continue home regimen       Subjective: Patient seen and examined this morning. Still complains of ongoing pain, would like to work with PT more before discharge.   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  LDL showed 38 cholesterol HDL 40. Overall risk of losing the right 2rd and 3rd toes is high, patient made aware.  Follow-up outpatient vascular surgery in a month with noninvasive studies currently allowing the area to demarcate.   Incidental finding of Pancreatic Tail Mass:suspect pancreatic cancer.CA19-9 was 13.Discussed with patient regarding the incidental CT finding, patient agreed with outpatient follow-up with contrast VOA for outpatient MRCP.   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  Weaned off IV fluids.  Monitor renal function closely follow-up outpatient Recent Labs    08/12/22 1007 08/12/22 2320 09/23/22 1138 10/10/22 2034 04/24/23 0842 04/25/23 1119 04/26/23 0218 04/27/23 0940 04/28/23 0511 04/29/23 0240  BUN 23 30* 20 23 28* 31* 36* 23 27* 29*  CREATININE 1.66* 2.09* 1.86* 1.85* 1.63* 2.06* 2.00* 1.59* 1.71* 1.82*  CO2 21* 17* 18* 20* 20* 23 22 20* 19* 21*    Hypomagnesemia: Resolved.    Acute on Chronic Hyponatremia: Sodium overall stable in 130 low side, off IV fluids encourage p.o. follow-up with PCP  IDDM with Hyperglycemia: Managed with long-acting insulin SSI last HbA1c 7.7 NOW. FU pcp  HTN, refractory:Continue amlodipine and Coreg, hydralazine.  BP currently well-controlled   COPD:Stable, continue Mometasone-Formoterol 200-5 mcg/ACT 2 puff IH BID and Albuterol 3 mL IH q6hprn Wheezing and SOB SpO2: 95 % O2 Flow Rate (L/min): 2 L/min  Constipation:Initiate Bowel Regimen continue  senna docusate 1 tab p.o. twice daily, MiraLAX 17 g p.o. twice daily and bisacodyl 10 mg rectal suppository daily moderate constipation.  No bowel meant for several days, add enema x 1   CAD:No  acute concerns. Cont ASA 81 mg po Daily, Clopidogrel 75 mg po Daily and Atorvastatin 80 mg po Daily   Hypoalbuminemia-augment diet  Glaucoma:C/w Brimonidine 1 drop Both Eyes TID and Latanoprost 1 drop Both Eyes qHS   DVT prophylaxis: heparin injection 5,000 Units Start: 04/24/23 2200 Code Status:   Code Status: Full Code Family Communication: plan of care discussed with patient at bedside. Patient status is:  inpatient  because of uncontrolled pain Level of care: Progressive Cardiac   Dispo: The patient is from: home            Anticipated disposition: home tomorrow Objective: Vitals last 24 hrs: Vitals:   04/28/23 2059 04/28/23 2206 04/29/23 0440 04/29/23 0817  BP: 116/72 134/83 (!) 148/79 139/70  Pulse: 66     Resp: 19  17   Temp: 98.6 F (37 C)     TempSrc: Oral     SpO2:      Weight:      Height:       Weight change:   Physical Examination: General exam: alert awake, older than stated age HEENT:Oral mucosa moist, Ear/Nose WNL grossly Respiratory system: bilaterally clear BS, no use of accessory muscle Cardiovascular system: S1 & S2 +, No JVD. Gastrointestinal system: Abdomen soft,NT,ND, BS+ Nervous System:Alert, awake, moving extremities. Extremities: LE edema neg, right foot with area of blackish discoloration in the toes see picture-unchanged from admission Skin: No rashes,no icterus. MSK: Normal muscle bulk,tone, power     Medications reviewed:  Scheduled Meds:  amLODipine  10 mg Oral Daily   aspirin EC  81 mg Oral Daily   atorvastatin  80 mg Oral Daily   brimonidine  1 drop Both Eyes TID   carvedilol  12.5 mg Oral BID WC   clopidogrel  75 mg Oral Q breakfast   heparin  5,000 Units Subcutaneous Q12H   hydrALAZINE  100 mg Oral BID   insulin aspart  0-9 Units Subcutaneous TID WC   insulin glargine-yfgn  25 Units Subcutaneous Q breakfast   isosorbide mononitrate  30 mg Oral Daily   latanoprost  1 drop Both Eyes QHS   linagliptin  5 mg Oral Daily    magnesium oxide  400 mg Oral Daily   mometasone-formoterol  2 puff Inhalation BID   polyethylene glycol  17 g Oral BID   senna-docusate  1 tablet Oral BID   sodium chloride flush  3 mL Intravenous Q12H   traZODone  75 mg Oral QHS   Continuous Infusions:  sodium chloride        Diet Order             Diet regular Room service appropriate? Yes; Fluid consistency: Thin  Diet effective now                   Intake/Output Summary (Last 24 hours) at 04/29/2023 1130 Last data filed at 04/28/2023 2209 Gross per 24 hour  Intake 685.77 ml  Output 675 ml  Net 10.77 ml   Net IO Since Admission: -622.8 mL [04/29/23 1130]  Wt Readings from Last 3 Encounters:  04/24/23 81.6 kg  10/10/22 83.9 kg  09/23/22 83 kg     Unresulted Labs (From admission, onward)     Start     Ordered  04/28/23 0500  CBC  Tomorrow morning,   R        04/27/23 1734           Data Reviewed: I have personally reviewed following labs and imaging studies CBC: Recent Labs  Lab 04/25/23 1119 04/26/23 0218 04/27/23 0940 04/28/23 0511 04/29/23 0240  WBC 7.2 7.7 6.5 8.1 8.5  NEUTROABS 3.7 3.8 3.6 4.8 5.0  HGB 13.4 12.6* 12.9* 12.7* 12.5*  HCT 36.9* 34.9* 35.0* 34.9* 35.0*  MCV 76.7* 76.4* 76.3* 77.2* 77.4*  PLT 175 169 162 185 170   Basic Metabolic Panel: Recent Labs  Lab 04/25/23 1119 04/26/23 0218 04/27/23 0940 04/28/23 0511 04/29/23 0240  NA 131* 130* 134* 129* 131*  K 3.8 3.7 3.9 4.7 4.5  CL 101 100 103 99 101  CO2 23 22 20* 19* 21*  GLUCOSE 198* 129* 93 153* 184*  BUN 31* 36* 23 27* 29*  CREATININE 2.06* 2.00* 1.59* 1.71* 1.82*  CALCIUM 9.3 9.2 9.3 9.3 9.3  MG 1.5* 1.5* 1.8 2.3 2.1  PHOS 3.1 3.9 3.3 3.6 3.7   GFR: Estimated Creatinine Clearance: 43.7 mL/min (A) (by C-G formula based on SCr of 1.82 mg/dL (H)). Liver Function Tests: Recent Labs  Lab 04/25/23 1119 04/26/23 0218 04/27/23 0940 04/28/23 0511 04/29/23 0240  AST 23 21 25  35 24  ALT 20 20 23 25 26   ALKPHOS 59 57  59 60 62  BILITOT 0.4 0.5 0.6 1.0 0.6  PROT 5.8* 5.8* 6.1* 5.9* 6.2*  ALBUMIN 3.5 3.4* 3.4* 3.4* 3.4*   No results for input(s): "LIPASE", "AMYLASE" in the last 168 hours. No results for input(s): "AMMONIA" in the last 168 hours. Coagulation Profile: No results for input(s): "INR", "PROTIME" in the last 168 hours. BNP (last 3 results) No results for input(s): "PROBNP" in the last 8760 hours. HbA1C: No results for input(s): "HGBA1C" in the last 72 hours. CBG: Recent Labs  Lab 04/28/23 0743 04/28/23 1233 04/28/23 1644 04/28/23 2147 04/29/23 0736  GLUCAP 119* 180* 182* 172* 182*   Lipid Profile: Recent Labs    04/28/23 0511  CHOL 95  HDL 40*  LDLCALC 38  TRIG 84  CHOLHDL 2.4   Thyroid Function Tests: No results for input(s): "TSH", "T4TOTAL", "FREET4", "T3FREE", "THYROIDAB" in the last 72 hours. Sepsis Labs: Recent Labs  Lab 04/24/23 0840 04/24/23 2002  LATICACIDVEN 1.0 0.7    No results found for this or any previous visit (from the past 240 hour(s)).  Antimicrobials: Anti-infectives (From admission, onward)    Start     Dose/Rate Route Frequency Ordered Stop   04/24/23 1500  cefTRIAXone (ROCEPHIN) 2 g in sodium chloride 0.9 % 100 mL IVPB        2 g 200 mL/hr over 30 Minutes Intravenous  Once 04/24/23 1450 04/24/23 1952   04/24/23 1500  metroNIDAZOLE (FLAGYL) IVPB 500 mg        500 mg 100 mL/hr over 60 Minutes Intravenous  Once 04/24/23 1450 04/25/23 0817      Culture/Microbiology    Component Value Date/Time   SDES URINE, RANDOM 07/29/2009 0135   SPECREQUEST IMMUNE:NORM UT SYMPT:NEG 07/29/2009 0135   CULT NO GROWTH 07/29/2009 0135   REPTSTATUS 07/30/2009 FINAL 07/29/2009 0135  Other culture-see note  Radiology Studies: PERIPHERAL VASCULAR CATHETERIZATION  Result Date: 04/27/2023 Images from the original result were not included. Patient name: Jose [name] MRN: [id number] DOB: 06/27/1961 Sex: male 04/27/2023 Pre-operative Diagnosis: Chronic right  lower extremity limb threatening with toe ulceration Post-operative  diagnosis:  Same Surgeon:  Luanna Salk. Randie Heinz, MD Procedure Performed: 1.  Ultrasound-guided cannulation and Mynx device closure left common femoral artery 2.  Catheter in aorta and bilateral lower extremity runoff 3.  Selection with catheter of right popliteal artery 4.  6 x 60 mm shockwave lithotripsy right popliteal artery 5.  Drug-coated balloon angioplasty 6 x 40 mm Ranger right popliteal artery 6.  Moderate sedation with fentanyl and Versed for 69 minutes Indications: 62 year old male with chronic right lower extremity limb threatening ischemia with dampened monophasic ABIs and 0 toe pressure on the right with a palpable popliteal pulse and nothing palpable below.  He is now indicated for angiography with possible intervention. Findings: His aorta and bilateral common external iliac arteries were patent as are the bilateral common femoral arteries.  He does have a steep aortic bifurcation.  The right SFA is calcified but there is no evidence of any stenosis.  Just below the knee joint there is heavy calcification of the popliteal artery it is subtotally occluded.  After shockwave lithotripsy and drug-coated balloon angioplasty this is reduced to 0% stenosis where previously was greater than 90% stenosis with heavy calcification.  The dominant runoff on the right side is via the peroneal artery which gives rise to the plantar vessels that are posterior tibial base.  There is posterior tibial artery runoff but this occludes above the ankle and the anterior tibial artery is occluded much higher in the leg.  At completion there is a multiphasic peroneal artery signal at the ankle.  Procedure:  The patient was identified in the holding area and taken to room 8.  The patient was then placed supine on the table and prepped and draped in the usual sterile fashion.  A time out was called.  Ultrasound was used to evaluate the left common femoral artery  which was noted to be patent and the area was anesthetized with 1% lidocaine and cannulated with micropuncture needle followed by wire and a sheath.  And images saved to the permanent record.  Concomitantly we administered fentanyl and Versed for moderate sedation and his vital signs were monitored throughout the case.  We placed a Bentson wire followed by 5 [place] sheath and Omni catheter to the level of L1 and aortogram was performed followed by limited bilateral lower extremity angiography to evaluate the bilateral common femoral arteries bilateral common and external iliac arteries.  We then crossed the bifurcation using a Glidewire and the Omni catheter and perform right lower extremity angiography and then used a Glidewire and quick cross catheter to the level of the knee to perform angiography below the knee and identify the tibial runoff.  With the above findings we placed a long 6 French sheath over a Glidewire advantage the patient was fully heparinized.  We then used a quick cross catheter and Glidewire advantage were able to cross the subtotally occluded popliteal artery and confirmed intraluminal access.  We placed an 014 wire and then performed lithotripsy with a shockwave 6 x 60 mm balloon for a total of 300 pulses.  Completion angiography demonstrated minimal residual stenosis and the calcium had significantly improved.  We then primarily performed drug-coated balloon angioplasty with a 6 x 40 mm Ranger at nominal pressure.  Completion demonstrated no residual stenosis no dissection's.  Runoff was via the peroneal artery there is possibly a 30% stenosis at the takeoff of the tibioperoneal trunk this is not treated.  We exchanged for a short 6 [place] sheath over a Bentson  wire and deployed a minx device.  Patient tolerated procedure without any complication. Contrast: 105cc Brandon C. Randie Heinz, MD [facility] Office: [phone] Pager: [phone]    LOS: 5 days    Lanae Boast, MD [facility]  04/29/2023, 11:30 AM

## 2023-04-30 DIAGNOSIS — I96 Gangrene, not elsewhere classified: Secondary | ICD-10-CM | POA: Diagnosis not present

## 2023-04-30 LAB — GLUCOSE, CAPILLARY: Glucose-Capillary: 157 mg/dL — ABNORMAL HIGH (ref 70–99)

## 2023-04-30 MED ORDER — POLYETHYLENE GLYCOL 3350 17 G PO PACK: 17.0000 g | PACK | Freq: Two times a day (BID) | ORAL | 0 refills | Status: DC

## 2023-04-30 MED ORDER — HYDROMORPHONE HCL 1 MG/ML IJ SOLN
1.0000 mg | Freq: Once | INTRAMUSCULAR | Status: AC | PRN
  Administered 2023-04-30: 1 mg via INTRAVENOUS
  Filled 2023-04-30: qty 1

## 2023-04-30 MED ORDER — ACETAMINOPHEN 500 MG PO TABS: 1000.0000 mg | ORAL_TABLET | Freq: Three times a day (TID) | ORAL | Status: DC

## 2023-04-30 MED ORDER — METHOCARBAMOL 500 MG PO TABS
750.0000 mg | ORAL_TABLET | Freq: Four times a day (QID) | ORAL | Status: DC | PRN
  Administered 2023-04-30: 750 mg via ORAL
  Filled 2023-04-30: qty 2

## 2023-04-30 MED ORDER — SENNOSIDES-DOCUSATE SODIUM 8.6-50 MG PO TABS: 1.0000 | ORAL_TABLET | Freq: Two times a day (BID) | ORAL | 0 refills | Status: DC

## 2023-04-30 MED ORDER — FLUTICASONE-SALMETEROL 500-50 MCG/ACT IN AEPB: 1.0000 | INHALATION_SPRAY | Freq: Two times a day (BID) | RESPIRATORY_TRACT | Status: DC

## 2023-04-30 MED ORDER — OXYCODONE HCL 5 MG PO TABS
5.0000 mg | ORAL_TABLET | ORAL | Status: DC | PRN
  Administered 2023-04-30: 10 mg via ORAL
  Filled 2023-04-30: qty 1

## 2023-04-30 NOTE — Discharge Summary (Signed)
Physician Discharge Summary  Jose [name]:[id number] DOB: 1961-07-16 DOA: 04/24/2023  PCP: [name]  Admit date: 04/24/2023 Discharge date: 04/30/2023 Recommendations for Outpatient Follow-up:  Follow up with PCP in 1 weeks-call for appointment Incidental finding of Pancreatic Tail Mass:suspect pancreatic cancer.CA19-9 was 13. [facility]. Your next clinic visit be June 11. Please obtain BMP/CBC in one week  Discharge Dispo: Home Discharge Condition: Stable Code Status:   Code Status: Full Code Diet recommendation:  Diet Order             Diet regular Room service appropriate? Yes; Fluid consistency: Thin  Diet effective now                    Brief/Interim Summary: 62 y.o.m w/ CAD status post stenting x 2, CKD stage II, COPD, IDDM, HTN, came with worsening of right foot pain on 5/31,  w/ symptoms started 4-5 weeks ago, also some epigastric area discomfort since past week.  Seen by PCP recently and placed on oxycodone In AO:[id number], nontachycardic nonhypotensive nonhypoxic.CTA showed diffuse atherosclerotic calcification of arteries and lower extremities, moderate to severe focal stenosis of the right popliteal artery.  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  AST ALT bilirubin within normal limits.  Creatinine 1.6 about his baseline, glucose 215 sodium 128 compared to baseline 126-132. Patient was placed on multiple pain regimen, vascular surgery consulted and admitted on IV antibiotics  ABI abnormal, status post a 6 x 6 mm shockwave lithotripsy of the left popliteal artery and popliteal angioplasty> managed on Aspirin Plavix statin.  LDL showed 38 cholesterol HDL 40. Patient also had AKI CKD stage IIIb with metabolic acidosis, creatinine remained stable  in peaked to 2.0 1/CHF acute on chronic hyponatremia, diabetes with hyperglycemia hypertension CAD COPD, patient continue home regimen He ws  monitored for better pain control, he has been ambulating well. 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 Overall risk of losing the right 2rd and 3rd toes is high, patient made aware.  Follow-up outpatient vascular surgery in a month with noninvasive studies currently allowing the area to demarcate. [facility] Chestine Spore patient was instructed to go to his pharmacy in [place]    Incidental finding of Pancreatic Tail Mass:suspect pancreatic cancer.CA19-9 was 13.Discussed with patient regarding the incidental CT finding, patient agreed with [facility].   -His next clinic visit be June 11.Consider Trouseau syndrome for his ischemic toes Again  I discussed with the patient  abr MRP- he prefers [facility] and get MRI done as outpatient I again reinforced that we are suspecting pancreatic cancer and he understands the importance of getting follow-up as soon as possible  AKI on CKD stage IIIb Metabolic Acidosis: creatinine remained stable  in peaked to 2.0.  Weaned off IV fluids.  Creatinine stable follow-up outpatient  Recent Labs    08/12/22 1007 08/12/22 2320 09/23/22 1138 10/10/22 2034 04/24/23 0842 04/25/23 1119 04/26/23 0218 04/27/23 0940  04/28/23 0511 04/29/23 0240  BUN 23 30* 20 23 28* 31* 36* 23 27* 29*  CREATININE 1.66* 2.09* 1.86* 1.85* 1.63* 2.06* 2.00* 1.59* 1.71* 1.82*  CO2 21* 17* 18* 20* 20* 23 22 20* 19* 21*    Hypomagnesemia: Resolved.    Acute on Chronic Hyponatremia: Sodium overall stable in 130 low side, off IV fluids encourage p.o. follow-up with PCP  IDDM with Hyperglycemia:Managed with long-acting insulin SSI last HbA1c 7.7 NOW. FU pcp  HTN, refractory:Continue amlodipine and Coreg, hydralazine.  BP currently well-controlled   COPD:Stable, continue Mometasone-Formoterol 200-5 mcg/ACT 2 puff IH BID and Albuterol 3 mL IH q6hprn Wheezing and SOB SpO2: 95 % O2 Flow Rate (L/min): 2 L/min  Constipation:Initiate Bowel Regimen continue senna docusate 1 tab p.o. twice daily, MiraLAX 17 g p.o. twice daily and bisacodyl 10 mg rectal suppository daily moderate constipation. Cont bowel regimen   CAD:No acute concerns. Cont ASA 81 mg po Daily, Clopidogrel 75 mg po Daily and Atorvastatin 80 mg po Daily   Hypoalbuminemia-augment diet  Glaucoma:C/w Brimonidine 1 drop Both Eyes TID and Latanoprost 1 drop Both Eyes qHS   Consults: Vascular surgery Subjective: Alert awake oriented resting comfortably  Discharge Exam: Vitals:   04/30/23 0614 04/30/23 0814  BP: 128/67 129/77  Pulse: (!) 59 (!) 56  Resp: 16 18  Temp: 98.1 F (36.7 C)   SpO2:     General: Pt is alert, awake, not in acute distress Cardiovascular: RRR, S1/S2 +, no rubs, no gallops Respiratory: CTA bilaterally, no wheezing, no rhonchi Abdominal: Soft, NT, ND, bowel sounds + Extremities: no edema, no cyanosis  Discharge Instructions  Discharge Instructions     Discharge instructions   Complete by: As directed    Incidental finding of Pancreatic Tail Mass:suspect pancreatic cancer.CA19-9 was 13. [facility]. Your next clinic visit be June 11.  Please call call MD or return to ER for similar or worsening  recurring problem that brought you to hospital or if any fever,nausea/vomiting,abdominal pain, uncontrolled pain, chest pain,  shortness of breath or any other alarming symptoms.  Please follow-up your doctor as instructed in a week time and call the office for appointment.  Please avoid alcohol, smoking, or any other illicit substance and maintain healthy habits including taking your regular medications as prescribed.  You were cared for by a hospitalist during your hospital stay. If you have any questions about your discharge medications or the care you received while you were in the hospital after you are discharged, you can call the unit and ask to speak with the hospitalist on call if the hospitalist that took care of you is not available.  Once you are discharged, your primary care physician will handle any further medical issues. Please note that NO REFILLS for any discharge medications will be authorized once you are discharged, as it is imperative that you return to your primary care physician (or establish a relationship with a primary care physician if you do not have one) for your aftercare needs so that they can reassess your need for medications and monitor your lab values   Increase activity slowly   Complete by: As directed    No wound  care   Complete by: As directed       Allergies as of 04/30/2023       Reactions   Lisinopril-hydrochlorothiazide Swelling   Causes swelling of lips   Simvastatin Other (See Comments)   ALT (SGPT) level raised, Aspartate aminotransferase serum level raised        Medication List     STOP taking these medications    predniSONE 20 MG tablet Commonly known as: DELTASONE       TAKE these medications    acetaminophen 325 MG tablet Commonly known as: TYLENOL Take 650 mg by mouth every 4 (four) hours as needed for mild pain, moderate pain or headache.   albuterol 108 (90 Base) MCG/ACT inhaler Commonly known as: VENTOLIN HFA Inhale 2  puffs into the lungs every 6 (six) hours as needed for wheezing or shortness of breath.   Alogliptin Benzoate 25 MG Tabs Take 0.5 tablets by mouth daily. For diabetes   amLODipine 10 MG tablet Commonly known as: NORVASC Take 1 tablet (10 mg total) by mouth daily.   aspirin EC 81 MG tablet Take 81 mg by mouth daily. Swallow whole.   atorvastatin 80 MG tablet Commonly known as: LIPITOR Take 1 tablet (80 mg total) by mouth daily.   Brinzolamide-Brimonidine 1-0.2 % Susp Apply 1 drop to eye 3 (three) times daily.   carvedilol 12.5 MG tablet Commonly known as: COREG Take 12.5 mg by mouth 2 (two) times daily with a meal.   clopidogrel 75 MG tablet Commonly known as: PLAVIX Take 1 tablet (75 mg total) by mouth daily with breakfast.   cyanocobalamin 500 MCG tablet Commonly known as: VITAMIN B12 Take 500 mcg by mouth daily.   Dextrose-Fructose-Sod Citrate 4.35-4.17-0.921 GM/15ML Liqd Take 15 cm by mouth as needed (15cm (1 tube) by mouth as needed for hypoglycemia for  glucose less than 70, repeat every 15 minutes if blood sugar less than 70.).   Fish Oil 1000 MG Caps Take 1,000 mg by mouth 2 (two) times daily.   fluticasone-salmeterol 500-50 MCG/ACT Aepb Commonly known as: ADVAIR Inhale 1 puff into the lungs in the morning and at bedtime.   furosemide 20 MG tablet Commonly known as: LASIX Take 1 tablet by mouth every other day.   gabapentin 300 MG capsule Commonly known as: NEURONTIN Take 300 mg by mouth every evening.   hydrALAZINE 100 MG tablet Commonly known as: APRESOLINE Take 1 tablet (100 mg total) by mouth 3 (three) times daily. What changed: when to take this   hydrOXYzine 25 MG capsule Commonly known as: VISTARIL Take 25 mg by mouth daily. Itching.   insulin glargine 100 UNIT/ML injection Commonly known as: LANTUS Inject 25 Units into the skin at bedtime.   ipratropium-albuterol 0.5-2.5 (3) MG/3ML Soln Commonly known as: DUONEB Take 3 mLs by nebulization  every 6 (six) hours as needed (Use 1 vial in nebulizer four times daily).   latanoprost 0.005 % ophthalmic solution Commonly known as: XALATAN Place 1 drop into both eyes at bedtime.   magnesium oxide 400 (240 Mg) MG tablet Commonly known as: MAG-OX Take 1 tablet (400 mg total) by mouth daily.   multivitamin with minerals Tabs tablet Take 1 tablet by mouth daily.   naloxone 4 MG/0.1ML Liqd nasal spray kit Commonly known as: NARCAN Place 1 spray into the nose once. Instill 1 spray into one nostril as directed for opioid overdose, call 911 immediately, administer dose, then turn person on side if no response in 2-3 minutes  or person responds but relapses. Repeat using a new spray device and into the other nostril.   nitroGLYCERIN 0.4 MG SL tablet Commonly known as: NITROSTAT Place 1 tablet (0.4 mg total) under the tongue every 5 (five) minutes as needed for chest pain.   omeprazole 40 MG capsule Commonly known as: PRILOSEC Take 40 mg by mouth 2 (two) times daily. Take one an empty stomach 30 minutes prior to a meal.   oxyCODONE 5 MG immediate release tablet Commonly known as: Oxy IR/ROXICODONE Take 1-2 tablets (5-10 mg total) by mouth every 6 (six) hours as needed for moderate pain. What changed:  how much to take when to take this reasons to take this   polyethylene glycol 17 g packet Commonly known as: MIRALAX / GLYCOLAX Take 17 g by mouth 2 (two) times daily.   senna-docusate 8.6-50 MG tablet Commonly known as: Senokot-S Take 1 tablet by mouth 2 (two) times daily for 14 days.   tadalafil 10 MG tablet Commonly known as: Cialis Take 1 tablet (10 mg total) by mouth daily as needed for erectile dysfunction.   traZODone 150 MG tablet Commonly known as: DESYREL Take 75 mg by mouth at bedtime.        Follow-up Information     Cephus Shelling, MD Follow up in 4 week(s).   Specialty: Vascular Surgery Why: Office will call you to arrange your appt (sent) Contact  information: [address] [place] [address] [phone]         [facility] up in 1 week(s).   Contact information: [address] [facility] [place] [address] [phone]                Allergies  Allergen Reactions   Lisinopril-Hydrochlorothiazide Swelling    Causes swelling of lips   Simvastatin Other (See Comments)    ALT (SGPT) level raised, Aspartate aminotransferase serum level raised    The results of significant diagnostics from this hospitalization (including imaging, microbiology, ancillary and laboratory) are listed below for reference.    Microbiology: No results found for this or any previous visit (from the past 240 hour(s)).  Procedures/Studies: PERIPHERAL VASCULAR CATHETERIZATION  Result Date: 04/27/2023 Images from the original result were not included. 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 Randie Heinz, MD Procedure Performed: 1.  Ultrasound-guided cannulation and Mynx device closure left common femoral artery 2.  Catheter in aorta and bilateral lower extremity runoff 3.  Selection with catheter of right popliteal artery 4.  6 x 60 mm shockwave lithotripsy right popliteal artery 5.  Drug-coated balloon angioplasty 6 x 40 mm Ranger right popliteal artery 6.  Moderate sedation with fentanyl and Versed for 69 minutes Indications: 62 year old male with chronic right lower extremity limb threatening ischemia with dampened monophasic ABIs and 0 toe pressure on the right with a palpable popliteal pulse and nothing palpable below.  He is now indicated for angiography with possible intervention. Findings: His aorta and bilateral common external iliac arteries were patent as are the bilateral common femoral arteries.  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  The right SFA is calcified but there is no evidence of any stenosis.  Just below the knee joint there is heavy calcification of the popliteal artery it is subtotally occluded.  After shockwave lithotripsy and drug-coated balloon angioplasty this is reduced to 0% stenosis  where previously was greater than 90% stenosis with heavy calcification.  The dominant runoff on the right side is via the peroneal artery which gives rise to the plantar vessels that are posterior tibial base.  There is posterior tibial artery runoff but this occludes above the ankle and the anterior tibial artery is occluded much higher in the leg.  At completion there is a multiphasic peroneal artery signal at the ankle.  Procedure:  The patient was identified in the holding area and taken to room 8.  The patient was then placed supine on the table and prepped and draped in the usual sterile fashion.  A time out was called.  Ultrasound was used to evaluate the left common femoral artery which was noted to be patent and the area was anesthetized with 1% lidocaine and cannulated with micropuncture needle followed by wire and a sheath.  And images saved to the permanent record.  Concomitantly we administered fentanyl and Versed for moderate sedation and his vital signs were monitored throughout the case.  We placed a Bentson wire followed by 5 [place] sheath and Omni catheter to the level of L1 and aortogram was performed followed by limited bilateral lower extremity angiography to evaluate the bilateral common femoral arteries bilateral common and external iliac arteries.  We then crossed the bifurcation using a Glidewire and the Omni catheter and perform right lower extremity angiography and then used a Glidewire and quick cross catheter to the level of the knee to perform angiography below the knee and identify the tibial runoff.  With the above findings we placed a long 6 French sheath over a Glidewire advantage the patient was  fully heparinized.  We then used a quick cross catheter and Glidewire advantage were able to cross the subtotally occluded popliteal artery and confirmed intraluminal access.  We placed an 014 wire and then performed lithotripsy with a shockwave 6 x 60 mm balloon for a total of 300 pulses.  Completion angiography demonstrated minimal residual stenosis and the calcium had significantly improved.  We then primarily performed drug-coated balloon angioplasty with a 6 x 40 mm Ranger at nominal pressure.  Completion demonstrated no residual stenosis no dissection's.  Runoff was via the peroneal artery there is possibly a 30% stenosis at the takeoff of the tibioperoneal trunk this is not treated.  We exchanged for a short 6 French sheath over a Bentson wire and deployed a minx device.  Patient tolerated procedure without any complication. Contrast: 105cc Brandon C. Randie Heinz, MD [facility] Office: [phone] Pager: [phone]  VAS [place] ABI WITH/WO TBI  Result Date: 04/25/2023  LOWER EXTREMITY DOPPLER STUDY Patient Name:  Jari Favre Junior [name]  Date of Exam:   04/24/2023 Medical Rec #: [id number]           Accession #:    [id number] Date of Birth: 09-Sep-1961           Patient Gender: M Patient Age:   20 years Exam Location:  [facility] Procedure:      VAS [place] ABI WITH/WO TBI Referring Phys: Sherald Hess --------------------------------------------------------------------------------  Indications: Peripheral artery disease. High Risk         Hypertension, hyperlipidemia, Diabetes, coronary artery Factors:          disease.  Comparison Study: No previous study. Performing Technologist: McKayla Maag RVT, [place]  Examination Guidelines: A complete evaluation includes at minimum, Doppler waveform signals and systolic blood pressure reading at the level of bilateral brachial, anterior tibial,  and posterior tibial arteries, when vessel segments are accessible. Bilateral testing is considered an  integral part of a complete examination. Photoelectric Plethysmograph (PPG) waveforms and toe systolic pressure readings are included as required and additional duplex testing as needed. Limited examinations for reoccurring indications may be performed as noted.  ABI Findings: +---------+------------------+-----+------------------+------------------------+ Right    Rt Pressure (mmHg)IndexWaveform          Comment                  +---------+------------------+-----+------------------+------------------------+ Brachial 199                    biphasic                                   +---------+------------------+-----+------------------+------------------------+ PTA      255               1.28 monophasic        Non-compressible         +---------+------------------+-----+------------------+------------------------+ DP                              dampened          Obtained with 2D imaging                                 monophasic                                 +---------+------------------+-----+------------------+------------------------+ Great Toe                       Absent                                     +---------+------------------+-----+------------------+------------------------+ +---------+------------------+-----+----------+--------------------------------+ Left     Lt Pressure (mmHg)IndexWaveform  Comment                          +---------+------------------+-----+----------+--------------------------------+ Brachial                        biphasic  No pressure obtained due to IV                                             placement.                       +---------+------------------+-----+----------+--------------------------------+ PTA      255               1.28 monophasicNon-compressible                 +---------+------------------+-----+----------+--------------------------------+ DP       255               1.28  monophasicNon-compressible                 +---------+------------------+-----+----------+--------------------------------+ Great Toe73                0.37 Abnormal                                   +---------+------------------+-----+----------+--------------------------------+ +-------+----------------+-----------+------------+------------+  ABI/TBIToday's ABI     Today's TBIPrevious ABIPrevious TBI +-------+----------------+-----------+------------+------------+ Right  Non-compressibe Absent                              +-------+----------------+-----------+------------+------------+ Left   Non-compressible0.37                                +-------+----------------+-----------+------------+------------+   Summary: Right: Resting right ankle-brachial index indicates noncompressible right lower extremity arteries. The right toe-brachial index is abnormal. Left: Resting left ankle-brachial index indicates noncompressible left lower extremity arteries. The left toe-brachial index is abnormal. *See table(s) above for measurements and observations.  Electronically signed by Sherald Hess MD on 04/25/2023 at 9:37:19 AM.    Final    CT Angio Aortobifemoral W and/or Wo Contrast  Result Date: 04/24/2023 CLINICAL DATA:  Claudication or leg ischemia. Toes turned black after wearing new shoes. EXAM: CT ANGIOGRAPHY OF ABDOMINAL AORTA WITH ILIOFEMORAL RUNOFF TECHNIQUE: Multidetector CT imaging of the abdomen, pelvis and lower extremities was performed using the standard protocol during bolus administration of intravenous contrast. Multiplanar CT image reconstructions and MIPs were obtained to evaluate the vascular anatomy. RADIATION DOSE REDUCTION: This exam was performed according to the departmental dose-optimization program which includes automated exposure control, adjustment of the mA and/or kV according to patient size and/or use of iterative reconstruction technique. CONTRAST:   OMNIPAQUE IOHEXOL 350 MG/ML SOLN COMPARISON:  CT abdomen pelvis 04/27/2019 FINDINGS: VASCULAR Aorta: Mild calcified atheromatous plaque without aneurysm or flow-limiting stenosis. Celiac: Patent without evidence of aneurysm, dissection, vasculitis or significant stenosis. SMA: Patent without evidence of aneurysm, dissection, vasculitis or significant stenosis. Renals: Both renal arteries are patent without evidence of aneurysm, dissection, vasculitis, fibromuscular dysplasia or significant stenosis. IMA: Patent without evidence of aneurysm, dissection, vasculitis or significant stenosis. RIGHT Lower Extremity Inflow: Common, internal and external iliac arteries are patent without evidence of aneurysm, dissection, vasculitis or significant stenosis. Outflow: Minimal calcified and noncalcified atheromatous plaque of the right common femoral artery without significant stenosis. Superficial femoral and profunda femoris arteries are diffusely calcified without significant narrowing. Moderate to severe focal stenosis of the right popliteal artery best seen on image 444 of series 5. Runoff: Lower leg arteries are diffusely calcified which limits the ability to determine if they are opacified. The peroneal artery appears patent. Anterior and posterior tibial arteries are likely occluded. LEFT Lower Extremity Inflow: Common, internal and external iliac arteries are patent without evidence of aneurysm, dissection, vasculitis or significant stenosis. Outflow: No significant stenosis of the common femoral artery. Superficial femoral, profundus femoris, and popliteal arteries are diffusely calcified but patent without significant stenosis. Runoff: Lower leg arteries are diffusely calcified which limits evaluation for opacification. The peroneal artery is patent to the level of the ankle. Unable to definitively determine if anterior and posterior tibial arteries are patent are occluded. Veins: No obvious venous abnormality within  the limitations of this arterial phase study. Review of the MIP images confirms the above findings. NON-VASCULAR Lower chest: No acute abnormality. Extensive coronary artery calcifications. Hepatobiliary: No focal liver abnormality is seen. No gallstones, gallbladder wall thickening, or biliary dilatation. Pancreas: 3.5 x 2.3 cm pancreatic tail mass (image 75, series 5) requires further evaluation with pancreatic mass protocol CT or MRI. No pancreatic duct dilatation. Spleen: Normal in size without focal abnormality. Adrenals/Urinary Tract: Adrenal glands are normal. 3 mm nonobstructing calculus present in the lower pole  of the right kidney. Stomach/Bowel: Stomach is within normal limits. Appendix appears normal. No evidence of bowel wall thickening, distention, or inflammatory changes. Lymphatic: No enlarged abdominal or pelvic lymph nodes. Reproductive: Prostate is unremarkable. Other: No abdominal wall hernia or abnormality. No abdominopelvic ascites. Musculoskeletal: Posttraumatic changes of the right mid tibia and fibula consistent with prior healed fractures. Right 10 mm loose body seen in the left knee joint. Tibial intramedullary rod is intact. Deformity of proximal left tibia and fibula consistent with remote healed fractures. IMPRESSION: 1. Extensive diffuse atherosclerotic calcification of the lower leg arteries. 2. 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 3. Lower leg arteries are diffusely calcified which limits evaluation for opacification. There is at least single-vessel runoff to both ankles through the peroneal arteries. Unable to definitively determine if anterior and posterior tibial arteries are opacified. 4. 3.5 x 2.3 cm pancreatic tail mass requires further evaluation with nonemergent pancreatic mass protocol CT or MRI. 5. 3 mm nonobstructing calculus in the lower pole of the right kidney. Electronically Signed   By: Acquanetta Belling M.D.   On: 04/24/2023 13:00   DG Foot  Complete Right  Result Date: 04/24/2023 CLINICAL DATA:  Right foot pain for 1 month. EXAM: RIGHT FOOT COMPLETE - 3+ VIEW COMPARISON:  None Available. FINDINGS: There is no evidence of acute fracture or dislocation. There is no evidence of arthropathy. Vascular calcifications are noted. Status post intramedullary rod fixation of right tibia. IMPRESSION: No acute abnormality seen. Electronically Signed   By: Lupita Raider M.D.   On: 04/24/2023 09:20   US Abdomen Complete  Result Date: 04/10/2023 CLINICAL DATA:  SPLEEN ABNORMALITY EXAM: ABDOMEN ULTRASOUND COMPLETE COMPARISON:  None Available. FINDINGS: Gallbladder: No gallstones or wall thickening visualized. No pericholecystic fluid. Common bile duct: Diameter: 0.2cm. Liver: No focal lesion identified. Normal homogeneous echogenicity. IVC: No abnormality visualized. Pancreas: Visualized portion unremarkable. Spleen: Size and appearance within normal limits. Right Kidney: Length: 10.4cm. Echogenicity within normal limits. No mass or hydronephrosis visualized. Left Kidney: Length: 9.3cm. Echogenicity within normal limits. No mass or hydronephrosis visualized. Abdominal aorta: No aneurysm visualized. IMPRESSION: Unremarkable examination of the abdomen. Electronically Signed   By: Layla Maw M.D.   On: 04/10/2023 19:44    Labs: BNP (last 3 results) Recent Labs    10/10/22 2034  BNP 117.7*   Basic Metabolic Panel: Recent Labs  Lab 04/25/23 1119 04/26/23 0218 04/27/23 0940 04/28/23 0511 04/29/23 0240  NA 131* 130* 134* 129* 131*  K 3.8 3.7 3.9 4.7 4.5  CL 101 100 103 99 101  CO2 23 22 20* 19* 21*  GLUCOSE 198* 129* 93 153* 184*  BUN 31* 36* 23 27* 29*  CREATININE 2.06* 2.00* 1.59* 1.71* 1.82*  CALCIUM 9.3 9.2 9.3 9.3 9.3  MG 1.5* 1.5* 1.8 2.3 2.1  PHOS 3.1 3.9 3.3 3.6 3.7   Liver Function Tests: Recent Labs  Lab 04/25/23 1119 04/26/23 0218 04/27/23 0940 04/28/23 0511 04/29/23 0240  AST 23 21 25  35 24  ALT 20 20 23 25 26    ALKPHOS 59 57 59 60 62  BILITOT 0.4 0.5 0.6 1.0 0.6  PROT 5.8* 5.8* 6.1* 5.9* 6.2*  ALBUMIN 3.5 3.4* 3.4* 3.4* 3.4*   No results for input(s): "LIPASE", "AMYLASE" in the last 168 hours. No results for input(s): "AMMONIA" in the last 168 hours. CBC: Recent Labs  Lab 04/25/23 1119 04/26/23 0218 04/27/23 0940 04/28/23 0511 04/29/23 0240  WBC 7.2 7.7 6.5 8.1 8.5  NEUTROABS  3.7 3.8 3.6 4.8 5.0  HGB 13.4 12.6* 12.9* 12.7* 12.5*  HCT 36.9* 34.9* 35.0* 34.9* 35.0*  MCV 76.7* 76.4* 76.3* 77.2* 77.4*  PLT 175 169 162 185 170   Recent Labs  Lab 04/29/23 0736 04/29/23 1139 04/29/23 1607 04/29/23 2151 04/30/23 0811  GLUCAP 182* 184* 190* 118* 157*   Recent Labs    04/28/23 0511  CHOL 95  HDL 40*  LDLCALC 38  TRIG 84  CHOLHDL 2.4      Component Value Date/Time   COLORURINE YELLOW 04/22/2022 1127   APPEARANCEUR CLEAR 04/22/2022 1127   LABSPEC 1.017 04/22/2022 1127   PHURINE 5.0 04/22/2022 1127   GLUCOSEU 150 (A) 04/22/2022 1127   HGBUR NEGATIVE 04/22/2022 1127   BILIRUBINUR NEGATIVE 04/22/2022 1127   KETONESUR NEGATIVE 04/22/2022 1127   PROTEINUR 30 (A) 04/22/2022 1127   UROBILINOGEN 1.0 07/23/2015 1403   NITRITE NEGATIVE 04/22/2022 1127   LEUKOCYTESUR NEGATIVE 04/22/2022 1127   Sepsis Labs Recent Labs  Lab 04/26/23 0218 04/27/23 0940 04/28/23 0511 04/29/23 0240  WBC 7.7 6.5 8.1 8.5   Microbiology No results found for this or any previous visit (from the past 240 hour(s)).  Time coordinating discharge: 25 minutes  SIGNED: Lanae Boast, MD  [facility] 04/30/2023, 10:40 AM  If 7PM-7AM, please contact night-coverage [email or web address]

## 2023-04-30 NOTE — Progress Notes (Signed)
Discharge instructions reviewed with pt. Pt verbalized understanding.handout in discharge packet for after procedure care. Dressing removed from left groin site, area CDI, no bruising, no bleeding.  Copy of instructions  given to pt. Scripts sent to pt's pharmacy for pick up.  Pt d/c'd via wheelchair with belongings. Pt called his friend and will be coming to main entrance within 10-12 minutes.             Escorted by hospital volunteer.   Matia Zelada,RN SWOT

## 2023-05-01 ENCOUNTER — Telehealth: Payer: Self-pay | Admitting: Physician Assistant

## 2023-05-01 NOTE — Telephone Encounter (Signed)
-----   Message from Lars Mage, [place] sent at 04/28/2023  7:29 AM EDT ----- 4 weeks f/u with right LE duplex and ABI's on Clark's schedule or PA on his day

## 2023-05-06 NOTE — Telephone Encounter (Signed)
Appt has been scheduled.

## 2023-05-09 ENCOUNTER — Inpatient Hospital Stay (HOSPITAL_COMMUNITY): Payer: No Typology Code available for payment source

## 2023-05-09 ENCOUNTER — Encounter (HOSPITAL_COMMUNITY): Payer: Self-pay

## 2023-05-09 ENCOUNTER — Inpatient Hospital Stay (HOSPITAL_COMMUNITY)
Admission: EM | Admit: 2023-05-09 | Discharge: 2023-05-18 | DRG: 240 | Disposition: A | Discharge status: Home Health | Payer: No Typology Code available for payment source | Attending: Internal Medicine | Admitting: Internal Medicine

## 2023-05-09 ENCOUNTER — Other Ambulatory Visit: Payer: Self-pay

## 2023-05-09 DIAGNOSIS — K5900 Constipation, unspecified: Secondary | ICD-10-CM | POA: Diagnosis present

## 2023-05-09 DIAGNOSIS — I10 Essential (primary) hypertension: Secondary | ICD-10-CM | POA: Diagnosis present

## 2023-05-09 DIAGNOSIS — Z7982 Long term (current) use of aspirin: Secondary | ICD-10-CM

## 2023-05-09 DIAGNOSIS — I2510 Atherosclerotic heart disease of native coronary artery without angina pectoris: Secondary | ICD-10-CM | POA: Diagnosis present

## 2023-05-09 DIAGNOSIS — E11621 Type 2 diabetes mellitus with foot ulcer: Secondary | ICD-10-CM | POA: Diagnosis present

## 2023-05-09 DIAGNOSIS — E1140 Type 2 diabetes mellitus with diabetic neuropathy, unspecified: Secondary | ICD-10-CM | POA: Diagnosis present

## 2023-05-09 DIAGNOSIS — G4700 Insomnia, unspecified: Secondary | ICD-10-CM | POA: Diagnosis present

## 2023-05-09 DIAGNOSIS — Z7902 Long term (current) use of antithrombotics/antiplatelets: Secondary | ICD-10-CM

## 2023-05-09 DIAGNOSIS — J449 Chronic obstructive pulmonary disease, unspecified: Secondary | ICD-10-CM | POA: Diagnosis present

## 2023-05-09 DIAGNOSIS — N1832 Chronic kidney disease, stage 3b: Secondary | ICD-10-CM | POA: Diagnosis present

## 2023-05-09 DIAGNOSIS — E1122 Type 2 diabetes mellitus with diabetic chronic kidney disease: Secondary | ICD-10-CM | POA: Diagnosis present

## 2023-05-09 DIAGNOSIS — E1169 Type 2 diabetes mellitus with other specified complication: Secondary | ICD-10-CM | POA: Diagnosis present

## 2023-05-09 DIAGNOSIS — I739 Peripheral vascular disease, unspecified: Secondary | ICD-10-CM | POA: Diagnosis not present

## 2023-05-09 DIAGNOSIS — Z888 Allergy status to other drugs, medicaments and biological substances status: Secondary | ICD-10-CM

## 2023-05-09 DIAGNOSIS — I70261 Atherosclerosis of native arteries of extremities with gangrene, right leg: Secondary | ICD-10-CM | POA: Diagnosis present

## 2023-05-09 DIAGNOSIS — H409 Unspecified glaucoma: Secondary | ICD-10-CM | POA: Diagnosis present

## 2023-05-09 DIAGNOSIS — K8689 Other specified diseases of pancreas: Secondary | ICD-10-CM | POA: Diagnosis present

## 2023-05-09 DIAGNOSIS — I129 Hypertensive chronic kidney disease with stage 1 through stage 4 chronic kidney disease, or unspecified chronic kidney disease: Secondary | ICD-10-CM | POA: Diagnosis present

## 2023-05-09 DIAGNOSIS — K869 Disease of pancreas, unspecified: Secondary | ICD-10-CM | POA: Diagnosis present

## 2023-05-09 DIAGNOSIS — E1152 Type 2 diabetes mellitus with diabetic peripheral angiopathy with gangrene: Principal | ICD-10-CM | POA: Diagnosis present

## 2023-05-09 DIAGNOSIS — L0390 Cellulitis, unspecified: Secondary | ICD-10-CM

## 2023-05-09 DIAGNOSIS — Z794 Long term (current) use of insulin: Secondary | ICD-10-CM

## 2023-05-09 DIAGNOSIS — R2681 Unsteadiness on feet: Secondary | ICD-10-CM | POA: Diagnosis not present

## 2023-05-09 DIAGNOSIS — E875 Hyperkalemia: Secondary | ICD-10-CM | POA: Diagnosis present

## 2023-05-09 DIAGNOSIS — E118 Type 2 diabetes mellitus with unspecified complications: Secondary | ICD-10-CM

## 2023-05-09 DIAGNOSIS — Z79899 Other long term (current) drug therapy: Secondary | ICD-10-CM | POA: Diagnosis not present

## 2023-05-09 DIAGNOSIS — I252 Old myocardial infarction: Secondary | ICD-10-CM

## 2023-05-09 DIAGNOSIS — I96 Gangrene, not elsewhere classified: Secondary | ICD-10-CM | POA: Diagnosis not present

## 2023-05-09 DIAGNOSIS — L97519 Non-pressure chronic ulcer of other part of right foot with unspecified severity: Secondary | ICD-10-CM | POA: Diagnosis present

## 2023-05-09 DIAGNOSIS — E1165 Type 2 diabetes mellitus with hyperglycemia: Secondary | ICD-10-CM | POA: Diagnosis present

## 2023-05-09 DIAGNOSIS — E1159 Type 2 diabetes mellitus with other circulatory complications: Secondary | ICD-10-CM | POA: Diagnosis present

## 2023-05-09 DIAGNOSIS — I25119 Atherosclerotic heart disease of native coronary artery with unspecified angina pectoris: Secondary | ICD-10-CM | POA: Diagnosis not present

## 2023-05-09 DIAGNOSIS — E871 Hypo-osmolality and hyponatremia: Secondary | ICD-10-CM | POA: Diagnosis present

## 2023-05-09 DIAGNOSIS — Z955 Presence of coronary angioplasty implant and graft: Secondary | ICD-10-CM | POA: Diagnosis not present

## 2023-05-09 DIAGNOSIS — Z87891 Personal history of nicotine dependence: Secondary | ICD-10-CM | POA: Diagnosis not present

## 2023-05-09 DIAGNOSIS — E872 Acidosis, unspecified: Secondary | ICD-10-CM | POA: Diagnosis present

## 2023-05-09 DIAGNOSIS — E785 Hyperlipidemia, unspecified: Secondary | ICD-10-CM | POA: Diagnosis present

## 2023-05-09 DIAGNOSIS — Z7951 Long term (current) use of inhaled steroids: Secondary | ICD-10-CM

## 2023-05-09 DIAGNOSIS — J410 Simple chronic bronchitis: Secondary | ICD-10-CM | POA: Diagnosis not present

## 2023-05-09 DIAGNOSIS — Z8249 Family history of ischemic heart disease and other diseases of the circulatory system: Secondary | ICD-10-CM

## 2023-05-09 DIAGNOSIS — N183 Chronic kidney disease, stage 3 unspecified: Secondary | ICD-10-CM | POA: Diagnosis present

## 2023-05-09 LAB — CBC WITH DIFFERENTIAL/PLATELET
Abs Immature Granulocytes: 0.04 10*3/uL (ref 0.00–0.07)
Basophils Absolute: 0 10*3/uL (ref 0.0–0.1)
Basophils Relative: 0 %
Eosinophils Absolute: 0.2 10*3/uL (ref 0.0–0.5)
Eosinophils Relative: 2 %
HCT: 38.7 % — ABNORMAL LOW (ref 39.0–52.0)
Hemoglobin: 14.2 g/dL (ref 13.0–17.0)
Immature Granulocytes: 0 %
Lymphocytes Relative: 15 %
Lymphs Abs: 1.4 10*3/uL (ref 0.7–4.0)
MCH: 28.2 pg (ref 26.0–34.0)
MCHC: 36.7 g/dL — ABNORMAL HIGH (ref 30.0–36.0)
MCV: 76.9 fL — ABNORMAL LOW (ref 80.0–100.0)
Monocytes Absolute: 0.2 10*3/uL (ref 0.1–1.0)
Monocytes Relative: 2 %
Neutro Abs: 7.7 10*3/uL (ref 1.7–7.7)
Neutrophils Relative %: 81 %
Platelets: 276 10*3/uL (ref 150–400)
RBC: 5.03 MIL/uL (ref 4.22–5.81)
RDW: 13.1 % (ref 11.5–15.5)
WBC: 9.6 10*3/uL (ref 4.0–10.5)
nRBC: 0 % (ref 0.0–0.2)

## 2023-05-09 LAB — COMPREHENSIVE METABOLIC PANEL
ALT: 22 U/L (ref 0–44)
AST: 28 U/L (ref 15–41)
Albumin: 4.1 g/dL (ref 3.5–5.0)
Alkaline Phosphatase: 89 U/L (ref 38–126)
Anion gap: 8 (ref 5–15)
BUN: 26 mg/dL — ABNORMAL HIGH (ref 8–23)
CO2: 22 mmol/L (ref 22–32)
Calcium: 9.6 mg/dL (ref 8.9–10.3)
Chloride: 91 mmol/L — ABNORMAL LOW (ref 98–111)
Creatinine, Ser: 1.73 mg/dL — ABNORMAL HIGH (ref 0.61–1.24)
GFR, Estimated: 44 mL/min — ABNORMAL LOW (ref >60)
Glucose, Bld: 385 mg/dL — ABNORMAL HIGH (ref 70–99)
Potassium: 4.9 mmol/L (ref 3.5–5.1)
Sodium: 121 mmol/L — ABNORMAL LOW (ref 135–145)
Total Bilirubin: 0.7 mg/dL (ref 0.3–1.2)
Total Protein: 7.1 g/dL (ref 6.5–8.1)

## 2023-05-09 LAB — CBG MONITORING, ED: Glucose-Capillary: 281 mg/dL — ABNORMAL HIGH (ref 70–99)

## 2023-05-09 LAB — VAS US ABI WITH/WO TBI: Right ABI: 1.52

## 2023-05-09 LAB — PREALBUMIN: Prealbumin: 23 mg/dL (ref 18–38)

## 2023-05-09 LAB — C-REACTIVE PROTEIN: CRP: 1 mg/dL — ABNORMAL HIGH (ref <1.0)

## 2023-05-09 LAB — LACTIC ACID, PLASMA: Lactic Acid, Venous: 1.2 mmol/L (ref 0.5–1.9)

## 2023-05-09 LAB — SEDIMENTATION RATE: Sed Rate: 8 mm/hr (ref 0–16)

## 2023-05-09 MED ORDER — BRIMONIDINE TARTRATE 0.2 % OP SOLN
1.0000 [drp] | Freq: Three times a day (TID) | OPHTHALMIC | Status: DC
  Administered 2023-05-10 – 2023-05-18 (×20): 1 [drp] via OPHTHALMIC
  Filled 2023-05-09 (×2): qty 5

## 2023-05-09 MED ORDER — SODIUM CHLORIDE 0.9 % IV SOLN: INTRAVENOUS | Status: DC

## 2023-05-09 MED ORDER — ONDANSETRON HCL 4 MG/2ML IJ SOLN: 4.0000 mg | Freq: Four times a day (QID) | INTRAMUSCULAR | Status: DC | PRN

## 2023-05-09 MED ORDER — FOLIC ACID 1 MG PO TABS
1.0000 mg | ORAL_TABLET | Freq: Every day | ORAL | Status: DC
  Administered 2023-05-09 – 2023-05-18 (×10): 1 mg via ORAL
  Filled 2023-05-09 (×10): qty 1

## 2023-05-09 MED ORDER — VANCOMYCIN HCL 1250 MG/250ML IV SOLN
1250.0000 mg | INTRAVENOUS | Status: DC
  Administered 2023-05-10 – 2023-05-12 (×3): 1250 mg via INTRAVENOUS
  Filled 2023-05-09 (×3): qty 250

## 2023-05-09 MED ORDER — HYDROXYZINE PAMOATE 25 MG PO CAPS: 25.0000 mg | ORAL_CAPSULE | Freq: Every day | ORAL | Status: DC

## 2023-05-09 MED ORDER — INSULIN ASPART 100 UNIT/ML IJ SOLN
0.0000 [IU] | Freq: Three times a day (TID) | INTRAMUSCULAR | Status: DC
  Administered 2023-05-09: 5 [IU] via SUBCUTANEOUS
  Administered 2023-05-10: 2 [IU] via SUBCUTANEOUS
  Administered 2023-05-10: 1 [IU] via SUBCUTANEOUS
  Administered 2023-05-11: 3 [IU] via SUBCUTANEOUS
  Administered 2023-05-11 – 2023-05-12 (×2): 1 [IU] via SUBCUTANEOUS
  Administered 2023-05-12: 2 [IU] via SUBCUTANEOUS
  Administered 2023-05-13: 5 [IU] via SUBCUTANEOUS
  Administered 2023-05-13: 7 [IU] via SUBCUTANEOUS
  Administered 2023-05-13 – 2023-05-15 (×4): 3 [IU] via SUBCUTANEOUS
  Administered 2023-05-15 (×2): 2 [IU] via SUBCUTANEOUS
  Administered 2023-05-16: 7 [IU] via SUBCUTANEOUS
  Administered 2023-05-16: 1 [IU] via SUBCUTANEOUS
  Administered 2023-05-17 (×2): 2 [IU] via SUBCUTANEOUS
  Administered 2023-05-18: 1 [IU] via SUBCUTANEOUS

## 2023-05-09 MED ORDER — ENOXAPARIN SODIUM 40 MG/0.4ML IJ SOSY
40.0000 mg | PREFILLED_SYRINGE | INTRAMUSCULAR | Status: DC
  Administered 2023-05-09 – 2023-05-17 (×9): 40 mg via SUBCUTANEOUS
  Filled 2023-05-09 (×9): qty 0.4

## 2023-05-09 MED ORDER — INSULIN ASPART 100 UNIT/ML IJ SOLN
0.0000 [IU] | Freq: Every day | INTRAMUSCULAR | Status: DC
  Administered 2023-05-09 – 2023-05-14 (×4): 3 [IU] via SUBCUTANEOUS

## 2023-05-09 MED ORDER — METRONIDAZOLE 500 MG/100ML IV SOLN
500.0000 mg | Freq: Three times a day (TID) | INTRAVENOUS | Status: DC
  Administered 2023-05-09 – 2023-05-13 (×12): 500 mg via INTRAVENOUS
  Filled 2023-05-09 (×12): qty 100

## 2023-05-09 MED ORDER — MAGNESIUM OXIDE -MG SUPPLEMENT 400 (240 MG) MG PO TABS
400.0000 mg | ORAL_TABLET | Freq: Every day | ORAL | Status: DC
  Administered 2023-05-09 – 2023-05-11 (×3): 400 mg via ORAL
  Filled 2023-05-09 (×3): qty 1

## 2023-05-09 MED ORDER — ALBUTEROL SULFATE HFA 108 (90 BASE) MCG/ACT IN AERS: 2.0000 | INHALATION_SPRAY | Freq: Four times a day (QID) | RESPIRATORY_TRACT | Status: DC | PRN

## 2023-05-09 MED ORDER — OXYCODONE HCL 5 MG PO TABS
5.0000 mg | ORAL_TABLET | ORAL | Status: DC | PRN
  Administered 2023-05-09 – 2023-05-13 (×7): 5 mg via ORAL
  Filled 2023-05-09 (×7): qty 1

## 2023-05-09 MED ORDER — POLYETHYLENE GLYCOL 3350 17 G PO PACK
17.0000 g | PACK | Freq: Two times a day (BID) | ORAL | Status: DC
  Administered 2023-05-09 – 2023-05-17 (×13): 17 g via ORAL
  Filled 2023-05-09 (×16): qty 1

## 2023-05-09 MED ORDER — LORAZEPAM 1 MG PO TABS: 1.0000 mg | ORAL_TABLET | ORAL | Status: DC | PRN

## 2023-05-09 MED ORDER — PANTOPRAZOLE SODIUM 40 MG PO TBEC
40.0000 mg | DELAYED_RELEASE_TABLET | Freq: Two times a day (BID) | ORAL | Status: DC
  Administered 2023-05-09 – 2023-05-18 (×18): 40 mg via ORAL
  Filled 2023-05-09 (×18): qty 1

## 2023-05-09 MED ORDER — HYDROMORPHONE HCL 1 MG/ML IJ SOLN
1.0000 mg | Freq: Once | INTRAMUSCULAR | Status: AC
  Administered 2023-05-09: 1 mg via INTRAVENOUS
  Filled 2023-05-09: qty 1

## 2023-05-09 MED ORDER — HYDRALAZINE HCL 50 MG PO TABS
100.0000 mg | ORAL_TABLET | Freq: Two times a day (BID) | ORAL | Status: DC
  Administered 2023-05-09 – 2023-05-13 (×9): 100 mg via ORAL
  Filled 2023-05-09 (×10): qty 2

## 2023-05-09 MED ORDER — ADULT MULTIVITAMIN W/MINERALS CH
1.0000 | ORAL_TABLET | Freq: Every day | ORAL | Status: DC
  Administered 2023-05-09 – 2023-05-18 (×10): 1 via ORAL
  Filled 2023-05-09 (×10): qty 1

## 2023-05-09 MED ORDER — ASPIRIN 81 MG PO TBEC
81.0000 mg | DELAYED_RELEASE_TABLET | Freq: Every day | ORAL | Status: DC
  Administered 2023-05-10 – 2023-05-18 (×9): 81 mg via ORAL
  Filled 2023-05-09 (×10): qty 1

## 2023-05-09 MED ORDER — THIAMINE HCL 100 MG/ML IJ SOLN: 100.0000 mg | Freq: Every day | INTRAMUSCULAR | Status: DC

## 2023-05-09 MED ORDER — BRINZOLAMIDE 1 % OP SUSP
1.0000 [drp] | Freq: Three times a day (TID) | OPHTHALMIC | Status: DC
  Administered 2023-05-10 – 2023-05-18 (×20): 1 [drp] via OPHTHALMIC
  Filled 2023-05-09 (×2): qty 10

## 2023-05-09 MED ORDER — VANCOMYCIN HCL 1750 MG/350ML IV SOLN
1750.0000 mg | Freq: Once | INTRAVENOUS | Status: AC
  Administered 2023-05-09: 1750 mg via INTRAVENOUS
  Filled 2023-05-09: qty 350

## 2023-05-09 MED ORDER — HYDROXYZINE HCL 25 MG PO TABS
25.0000 mg | ORAL_TABLET | Freq: Every day | ORAL | Status: DC
  Administered 2023-05-10 – 2023-05-18 (×9): 25 mg via ORAL
  Filled 2023-05-09 (×10): qty 1

## 2023-05-09 MED ORDER — HYDRALAZINE HCL 20 MG/ML IJ SOLN: 5.0000 mg | INTRAMUSCULAR | Status: DC | PRN

## 2023-05-09 MED ORDER — ONDANSETRON HCL 4 MG PO TABS: 4.0000 mg | ORAL_TABLET | Freq: Four times a day (QID) | ORAL | Status: DC | PRN

## 2023-05-09 MED ORDER — ACETAMINOPHEN 650 MG RE SUPP: 650.0000 mg | Freq: Four times a day (QID) | RECTAL | Status: DC | PRN

## 2023-05-09 MED ORDER — TRAZODONE HCL 50 MG PO TABS
75.0000 mg | ORAL_TABLET | Freq: Every day | ORAL | Status: DC
  Administered 2023-05-09 – 2023-05-17 (×9): 75 mg via ORAL
  Filled 2023-05-09 (×9): qty 2

## 2023-05-09 MED ORDER — MORPHINE SULFATE (PF) 2 MG/ML IV SOLN
2.0000 mg | INTRAVENOUS | Status: DC | PRN
  Administered 2023-05-09 – 2023-05-10 (×5): 2 mg via INTRAVENOUS
  Filled 2023-05-09 (×5): qty 1

## 2023-05-09 MED ORDER — ONDANSETRON HCL 4 MG/2ML IJ SOLN
4.0000 mg | Freq: Once | INTRAMUSCULAR | Status: AC
  Administered 2023-05-09: 4 mg via INTRAVENOUS
  Filled 2023-05-09: qty 2

## 2023-05-09 MED ORDER — ALBUTEROL SULFATE (2.5 MG/3ML) 0.083% IN NEBU: 2.5000 mg | INHALATION_SOLUTION | Freq: Four times a day (QID) | RESPIRATORY_TRACT | Status: DC | PRN

## 2023-05-09 MED ORDER — SODIUM CHLORIDE 0.9 % IV BOLUS
500.0000 mL | Freq: Once | INTRAVENOUS | Status: AC
  Administered 2023-05-09: 500 mL via INTRAVENOUS

## 2023-05-09 MED ORDER — BISACODYL 5 MG PO TBEC: 5.0000 mg | DELAYED_RELEASE_TABLET | Freq: Every day | ORAL | Status: DC | PRN

## 2023-05-09 MED ORDER — ACETAMINOPHEN 325 MG PO TABS
650.0000 mg | ORAL_TABLET | Freq: Four times a day (QID) | ORAL | Status: DC | PRN
  Administered 2023-05-10 – 2023-05-18 (×10): 650 mg via ORAL
  Filled 2023-05-09 (×10): qty 2

## 2023-05-09 MED ORDER — CARVEDILOL 12.5 MG PO TABS
12.5000 mg | ORAL_TABLET | Freq: Two times a day (BID) | ORAL | Status: DC
  Administered 2023-05-09 – 2023-05-18 (×17): 12.5 mg via ORAL
  Filled 2023-05-09 (×17): qty 1

## 2023-05-09 MED ORDER — MOMETASONE FURO-FORMOTEROL FUM 200-5 MCG/ACT IN AERO
2.0000 | INHALATION_SPRAY | Freq: Two times a day (BID) | RESPIRATORY_TRACT | Status: DC
  Administered 2023-05-10 – 2023-05-18 (×15): 2 via RESPIRATORY_TRACT
  Filled 2023-05-09 (×2): qty 8.8

## 2023-05-09 MED ORDER — DOCUSATE SODIUM 100 MG PO CAPS
100.0000 mg | ORAL_CAPSULE | Freq: Two times a day (BID) | ORAL | Status: DC
  Administered 2023-05-09 – 2023-05-18 (×17): 100 mg via ORAL
  Filled 2023-05-09 (×18): qty 1

## 2023-05-09 MED ORDER — INSULIN GLARGINE-YFGN 100 UNIT/ML ~~LOC~~ SOLN
25.0000 [IU] | Freq: Every day | SUBCUTANEOUS | Status: DC
  Filled 2023-05-09: qty 0.25

## 2023-05-09 MED ORDER — POLYETHYLENE GLYCOL 3350 17 G PO PACK: 17.0000 g | PACK | Freq: Every day | ORAL | Status: DC | PRN

## 2023-05-09 MED ORDER — LATANOPROST 0.005 % OP SOLN
1.0000 [drp] | Freq: Every day | OPHTHALMIC | Status: DC
  Administered 2023-05-09 – 2023-05-17 (×9): 1 [drp] via OPHTHALMIC
  Filled 2023-05-09: qty 2.5

## 2023-05-09 MED ORDER — LORAZEPAM 2 MG/ML IJ SOLN: 1.0000 mg | INTRAMUSCULAR | Status: DC | PRN

## 2023-05-09 MED ORDER — AMLODIPINE BESYLATE 10 MG PO TABS
10.0000 mg | ORAL_TABLET | Freq: Every day | ORAL | Status: DC
  Administered 2023-05-11 – 2023-05-18 (×8): 10 mg via ORAL
  Filled 2023-05-09 (×10): qty 1

## 2023-05-09 MED ORDER — SODIUM CHLORIDE 0.9 % IV SOLN
2.0000 g | INTRAVENOUS | Status: AC
  Administered 2023-05-09 – 2023-05-14 (×6): 2 g via INTRAVENOUS
  Filled 2023-05-09 (×6): qty 20

## 2023-05-09 MED ORDER — THIAMINE MONONITRATE 100 MG PO TABS
100.0000 mg | ORAL_TABLET | Freq: Every day | ORAL | Status: DC
  Administered 2023-05-09 – 2023-05-18 (×10): 100 mg via ORAL
  Filled 2023-05-09 (×10): qty 1

## 2023-05-09 MED ORDER — GABAPENTIN 300 MG PO CAPS
300.0000 mg | ORAL_CAPSULE | Freq: Every evening | ORAL | Status: DC
  Administered 2023-05-09 – 2023-05-17 (×9): 300 mg via ORAL
  Filled 2023-05-09 (×9): qty 1

## 2023-05-09 MED ORDER — ATORVASTATIN CALCIUM 80 MG PO TABS
80.0000 mg | ORAL_TABLET | Freq: Every day | ORAL | Status: DC
  Administered 2023-05-10 – 2023-05-18 (×9): 80 mg via ORAL
  Filled 2023-05-09 (×10): qty 1

## 2023-05-09 MED ORDER — SENNOSIDES-DOCUSATE SODIUM 8.6-50 MG PO TABS
1.0000 | ORAL_TABLET | Freq: Two times a day (BID) | ORAL | Status: DC
  Administered 2023-05-09 – 2023-05-18 (×16): 1 via ORAL
  Filled 2023-05-09 (×17): qty 1

## 2023-05-09 NOTE — Progress Notes (Signed)
Pharmacy Antibiotic Note  Fabiola Junior Sia is a 62 y.o. male admitted on 05/09/2023 with  wound infection .  Pharmacy has been consulted for vancomycin dosing.  Plan: Give IV Vancomycin 1750mg  x 1 for loading dose, followed by IV Vancomycin 1250mg  every 24 hours for eAUC500  Follow culture data for de-escalation.  Monitor renal function for dose adjustments as indicated.   Weight: 81.6 kg (179 lb 14.3 oz)  Temp (24hrs), Avg:97.8 F (36.6 C), Min:97.8 F (36.6 C), Max:97.8 F (36.6 C)  Recent Labs  Lab 05/09/23 1014  WBC 9.6  CREATININE 1.73*    Estimated Creatinine Clearance: 45.9 mL/min (A) (by C-G formula based on SCr of 1.73 mg/dL (H)).    Allergies  Allergen Reactions   Lisinopril-Hydrochlorothiazide Swelling    Causes swelling of lips   Simvastatin Other (See Comments)    ALT (SGPT) level raised, Aspartate aminotransferase serum level raised   Thank you for allowing pharmacy to be a part of this patient's care.  Estill Batten, PharmD, BCCCP  05/09/2023 12:47 PM

## 2023-05-09 NOTE — H&P (Signed)
History and Physical    Patient: Jose [name] ZOX:[id number] DOB: Dec 25, 1960 DOA: 05/09/2023 DOS: the patient was seen and examined on 05/09/2023 PCP: [name]  Patient coming from: Home - lives alone; NOK: Niece, [name], [phone]   Chief Complaint: Necrotic foot  HPI: Jose [name] is a 62 y.o. male with medical history significant of polysubstance abuse (ETOH, cocaine), PVD, CAD s/p stents, stage 3 CKD, COPD, DM, HTN, and HLD presenting with a necrotic foot. He was recently admitted from 5/31-6/6 for dry gangrene and underwent lithotripsy of the L popliteal artery and angioplasty, plan to follow up with vascular surgery in a month to allow necrotic foot to demarcate.  During that visit he was also noted to have an incidental pancreatic tail mass that was suspicion for pancreatic CA and was recommended to have outpatient MRCP.  Pain and discoloration were worsening so he returned to the ER.  He reports that it only involved the 2/3 toes of the R foot when he was here previously but has extended onto his lateral great toe now with surrounding erythema of the forefoot.  It is very painful, keeping him up at night.  He has continued to drink 2-3 beers nightly since dc and smoke marijuana periodically.  He tries to eat well, has fruit in his oatmeal and thinks this made him sugar go up today.      ER Course:  Needs amputation, toes looking worse from prior admission.  Pain is not well controlled.  No fevers, doesn't look infected.  Underwent recent vascular procedure, seems to be worsening.  Dr. Lenell Antu will consult.       Review of Systems: As mentioned in the history of present illness. All other systems reviewed and are negative. Past Medical History:  Diagnosis Date   Alcohol abuse    CAD (coronary artery disease)    a. Reported MI in 2012 s/p 2 stents;  b. 08/2012 Cath: LM 20, LAD 20 diff ISR, jailed septal - 99%, LCX 30ost, RI large, min irregs, RCA  30p, 20-30 ISR-->Med Rx; c. 2017 [facility].   CKD (chronic kidney disease), stage III (HCC)    Cocaine abuse (HCC)    COPD (chronic obstructive pulmonary disease) (HCC)    Depression    Diabetes mellitus without complication (HCC)    Hyperlipidemia    Hypertension    Hypertensive urgency 02/11/2021   Noncompliance    Past Surgical History:  Procedure Laterality Date   ABDOMINAL AORTOGRAM W/LOWER EXTREMITY N/A 04/27/2023   Procedure: ABDOMINAL AORTOGRAM W/LOWER EXTREMITY;  Surgeon: Maeola Harman, MD;  Location: [facility] INVASIVE CV LAB;  Service: Cardiovascular;  Laterality: N/A;   CARDIAC CATHETERIZATION     CORONARY STENT PLACEMENT     LEFT HEART CATH AND CORONARY ANGIOGRAPHY N/A 08/22/2020   Procedure: LEFT HEART CATH AND CORONARY ANGIOGRAPHY;  Surgeon: Lyn Records, MD;  Location: [facility] INVASIVE CV LAB;  Service: Cardiovascular;  Laterality: N/A;   LEFT HEART CATH AND CORONARY ANGIOGRAPHY N/A 01/28/2022   Procedure: LEFT HEART CATH AND CORONARY ANGIOGRAPHY;  Surgeon: Lennette Bihari, MD;  Location: [facility] INVASIVE CV LAB;  Service: Cardiovascular;  Laterality: N/A;   LEFT HEART CATHETERIZATION WITH CORONARY ANGIOGRAM Bilateral 08/25/2012   Procedure: LEFT HEART CATHETERIZATION WITH CORONARY ANGIOGRAM;  Surgeon: Kathleene Hazel, MD;  Location: [facility] CATH LAB;  Service: Cardiovascular;  Laterality: Bilateral;   PERIPHERAL INTRAVASCULAR LITHOTRIPSY  04/27/2023   Procedure: PERIPHERAL INTRAVASCULAR LITHOTRIPSY;  Surgeon:  Maeola Harman, MD;  Location: [facility] INVASIVE CV LAB;  Service: Cardiovascular;;   PERIPHERAL VASCULAR BALLOON ANGIOPLASTY  04/27/2023   Procedure: PERIPHERAL VASCULAR BALLOON ANGIOPLASTY;  Surgeon: Maeola Harman, MD;  Location: [facility] INVASIVE CV LAB;  Service: Cardiovascular;;   Social History:  reports that he quit smoking about 15 years ago. His smoking use included cigarettes. He has never used smokeless tobacco. He reports current  alcohol use. He reports current drug use. Drug: Marijuana.  Allergies  Allergen Reactions   Lisinopril-Hydrochlorothiazide Swelling and Other (See Comments)    Causes swelling of lips   Simvastatin Other (See Comments)    ALT (SGPT) level raised, Aspartate aminotransferase serum level raised    Family History  Problem Relation Age of Onset   Heart disease Mother        MI 50s    Prior to Admission medications   Medication Sig Start Date End Date Taking? Authorizing Provider  acetaminophen (TYLENOL) 325 MG tablet Take 650 mg by mouth every 4 (four) hours as needed for mild pain, moderate pain or headache.    [provider]  albuterol (PROVENTIL HFA;VENTOLIN HFA) 108 (90 BASE) MCG/ACT inhaler Inhale 2 puffs into the lungs every 6 (six) hours as needed for wheezing or shortness of breath.     [provider]  Alogliptin Benzoate 25 MG TABS Take 0.5 tablets by mouth daily. For diabetes    [provider]  amLODipine (NORVASC) 10 MG tablet Take 1 tablet (10 mg total) by mouth daily. 03/12/21   [name], Deloris Ping, MD  aspirin EC 81 MG tablet Take 81 mg by mouth daily. Swallow whole.    [provider]  atorvastatin (LIPITOR) 80 MG tablet Take 1 tablet (80 mg total) by mouth daily. 01/30/22   Marjie Skiff E, PA-C  Brinzolamide-Brimonidine 1-0.2 % SUSP Apply 1 drop to eye 3 (three) times daily.    [provider]  carvedilol (COREG) 12.5 MG tablet Take 12.5 mg by mouth 2 (two) times daily with a meal.    [provider]  clopidogrel (PLAVIX) 75 MG tablet Take 1 tablet (75 mg total) by mouth daily with breakfast. 01/30/22   Marjie Skiff E, PA-C  cyanocobalamin (VITAMIN B12) 500 MCG tablet Take 500 mcg by mouth daily.    [provider]  Dextrose-Fructose-Sod Citrate 4.35-4.17-0.921 GM/15ML LIQD Take 15 cm by mouth as needed (15cm (1 tube) by mouth as needed for hypoglycemia for  glucose less than 70, repeat every 15 minutes if blood  sugar less than 70.).    [provider]  fluticasone-salmeterol (ADVAIR) 500-50 MCG/ACT AEPB Inhale 1 puff into the lungs in the morning and at bedtime. 04/30/23   Lanae Boast, MD  furosemide (LASIX) 20 MG tablet Take 1 tablet by mouth every other day. 09/21/20   [provider]  gabapentin (NEURONTIN) 300 MG capsule Take 300 mg by mouth every evening.    [provider]  hydrALAZINE (APRESOLINE) 100 MG tablet Take 1 tablet (100 mg total) by mouth 3 (three) times daily. Patient taking differently: Take 100 mg by mouth 2 (two) times daily. 03/12/21   [name], Deloris Ping, MD  hydrOXYzine (VISTARIL) 25 MG capsule Take 25 mg by mouth daily. Itching.    [provider]  insulin glargine (LANTUS) 100 UNIT/ML injection Inject 25 Units into the skin at bedtime.    [provider]  ipratropium-albuterol (DUONEB) 0.5-2.5 (3) MG/3ML SOLN Take 3 mLs by nebulization every 6 (six) hours as  needed (Use 1 vial in nebulizer four times daily).    [provider]  latanoprost (XALATAN) 0.005 % ophthalmic solution Place 1 drop into both eyes at bedtime.  04/11/15   [provider]  magnesium oxide (MAG-OX) 400 (240 Mg) MG tablet Take 1 tablet (400 mg total) by mouth daily. 08/12/22   Bing Neighbors, NP  Multiple Vitamin (MULTIVITAMIN WITH MINERALS) TABS tablet Take 1 tablet by mouth daily.    [provider]  naloxone [facility]) nasal spray 4 mg/0.1 mL Place 1 spray into the nose once. Instill 1 spray into one nostril as directed for opioid overdose, call 911 immediately, administer dose, then turn person on side if no response in 2-3 minutes or person responds but relapses. Repeat using a new spray device and into the other nostril.    [provider]  nitroGLYCERIN (NITROSTAT) 0.4 MG SL tablet Place 1 tablet (0.4 mg total) under the tongue every 5 (five) minutes as needed for chest pain. 01/29/22   Corrin Parker, PA-C  Omega-3 Fatty Acids  (FISH OIL) 1000 MG CAPS Take 1,000 mg by mouth 2 (two) times daily.    [provider]  omeprazole (PRILOSEC) 40 MG capsule Take 40 mg by mouth 2 (two) times daily. Take one an empty stomach 30 minutes prior to a meal.    [provider]  oxyCODONE (OXY IR/ROXICODONE) 5 MG immediate release tablet Take 1-2 tablets (5-10 mg total) by mouth every 6 (six) hours as needed for moderate pain. 04/28/23   Lars Mage, PA-C  polyethylene glycol (MIRALAX / GLYCOLAX) 17 g packet Take 17 g by mouth 2 (two) times daily. 04/30/23   Lanae Boast, MD  senna-docusate (SENOKOT-S) 8.6-50 MG tablet Take 1 tablet by mouth 2 (two) times daily for 14 days. 04/30/23 05/14/23  Lanae Boast, MD  tadalafil (CIALIS) 10 MG tablet Take 1 tablet (10 mg total) by mouth daily as needed for erectile dysfunction. 10/21/22   Sharlene Dory, PA-C  traZODone (DESYREL) 150 MG tablet Take 75 mg by mouth at bedtime.    [provider]    Physical Exam: Vitals:   05/09/23 1030 05/09/23 1100 05/09/23 1130 05/09/23 1200  BP: (!) 150/99 138/82 (!) 143/78 (!) 175/75  Pulse: 69 73 69 73  Resp: 17     Temp:      TempSrc:      SpO2: 97% 100% 98% 99%  Weight:    81.6 kg   General:  Appears calm and comfortable and is in NAD Eyes:   EOMI, normal lids, iris ENT:  grossly normal hearing, lips & tongue, mmm Neck:  no LAD, masses or thyromegaly Cardiovascular:  RRR, no m/r/g. No LE edema.  Respiratory:   CTA bilaterally with no wheezes/rales/rhonchi.  Normal respiratory effort. Abdomen:  soft, NT, ND Skin:  Necrosis of R 2nd toe completely, most of 3rd tie, lateral 1st toe with surrounding forefoot erythema      Musculoskeletal:  grossly normal tone BUE/BLE, good ROM, no bony abnormality other than as above Lower extremity:  No LE edema.  Foot exam as above.  2+ distal pulses. Psychiatric:  grossly normal mood and affect, speech fluent and appropriate, AOx3 Neurologic:  CN 2-12 grossly intact, moves all extremities  in coordinated fashion   Radiological Exams on Admission: Independently reviewed - see discussion in A/P where applicable  No results found.  EKG: pending   Labs on Admission: I have personally reviewed the available labs and imaging  studies at the time of the admission.  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) Active Problems:   Type 2 diabetes mellitus with complication, with long-term current use of insulin (HCC)   Dyslipidemia associated with type 2 diabetes mellitus (HCC)   Hypertension   Hyponatremia   CKD (chronic kidney disease) stage 3, GFR 30-59 ml/min   COPD (chronic obstructive pulmonary disease) (HCC)   CAD (coronary artery disease)   Pancreatic mass   Constipation   Glaucoma    Assessment and Plan:   Gangrene of R foot/PVD -Recent hospitalization for the same, now with worsening symptoms -During his last hospitalization he underwent successful revascularization -Now with progressive necrosis, surrounding erythema concerning for cellulitis -He appears to need amputation, likely TMA but will defer to vascular -Check lactate, CRP, ESR, prealbumin, blood cultures -Will treat with IV antibiotics (Rocephin/Flagyl/Vanc as per the lower extremity wound algorithm) -Vascular surgery will consult -LE wound order set utilized including consults (diabetes coordinator; peripheral vascular navigator; TOC team; and nutrition)  -Continue ASA, will hold Plavix for now unless vascular says it is ok to continue -Continue gabapentin   Incidental finding of Pancreatic Tail Mass -Noted during last hospitalization, concerning for pancreatic cancer. -CA19-9 was 13 -[facility].   -He had a telephone visit on 6/11 and appears to have been referred for MRCP,  although this does not appear to have been done yet -Consider Trouseau syndrome for his ischemic toes (migratory thrombophlebitis associated with undiagnosed malignancy), but appears to be more likely related to chronic uncontrolled DM and HTN -Consider inpatient MRCP since depending on his clinical course it may be some time until he is able to effectively follow up as an outpatient for further treatment   Stage 3b CKD -Appears to be stable at this time -Attempt to avoid nephrotoxic medications -Recheck BMP in AM   Acute on Chronic Hyponatremia -Baseline appears to be 128-130, currently slightly lower than baseline -Suspect beer potomania with probable volume deficiency in the setting of necrotic foot -Will hydrate and follow   DM -Recent A1c was 7.7 -hold alogliptin -Continue Lantus -Cover with sensitive-scale SSI    HTN, refractory -Continue amlodipine, carvedilol, hydralazine  HLD -Continue atorvastatin -Hold fish oil due to limited inpatient utility  COPD -Appears to be compensated -Continue Advair (Dulera per formulary), Albuterol -Hold Duonebs (uses prn)   Constipation -Continue Miralax and Senokot S BID   CAD -No acute concerns -Continue ASA 81 and hold Plavix for now  Glaucoma -Continue Brimonidine, brinzolamide, and Latanoprost     Advance Care Planning:   Code Status: Full Code - Code status was discussed with the patient and/or family at the time of admission.  The patient would want to receive full resuscitative measures at this time.   Consults: Vascular surgery; diabetes coordinator; peripheral vascular navigator; TOC team; and nutrition  DVT Prophylaxis: Lovenox  Family Communication: None present; he preferred to call his niece himself at the time of admission  Severity of Illness: The appropriate patient status for this patient is INPATIENT. Inpatient status is judged to be reasonable and necessary in order to provide the required intensity of  service to ensure the patient's safety. The patient's presenting symptoms, physical exam findings, and initial radiographic and laboratory data in the context of their chronic comorbidities  is felt to place them at high risk for further clinical deterioration. Furthermore, it is not anticipated that the patient will be medically stable for discharge from the hospital within 2 midnights of admission.   * I certify that at the point of admission it is my clinical judgment that the patient will require inpatient hospital care spanning beyond 2 midnights from the point of admission due to high intensity of service, high risk for further deterioration and high frequency of surveillance required.*   Author: Jonah Blue, MD 05/09/2023 1:03 PM  For on call review [email or web address].

## 2023-05-09 NOTE — ED Notes (Signed)
ED TO INPATIENT HANDOFF REPORT  ED Nurse Name and Phone #: Lew Dawes RN [phone]  S Name/Age/Gender Jose [name] 62 y.o. male Room/Bed: TRABC/TRABC  Code Status   Code Status: Full Code  Home/SNF/Other Home Patient oriented to: self, place, time, and situation Is this baseline? 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 Does report he had surgery to remove a clot in his Rt leg last week. A/Ox4, rates pain 10/10, uses cane to ambulate.    Allergies Allergies  Allergen Reactions   Lisinopril-Hydrochlorothiazide Swelling and Other (See Comments)    Causes swelling of lips   Simvastatin Other (See Comments)    ALT (SGPT) level raised, Aspartate aminotransferase serum level raised    Level of Care/Admitting Diagnosis ED Disposition     ED Disposition  Admit   Condition  --   Comment  Hospital Area: [facility] [[id number]]  Level of Care: Med-Surg [[id number]]  May admit patient to [facility] or [facility] if equivalent level of care is available:: No  Covid Evaluation: Asymptomatic - no recent exposure (last 10 days) testing not required  Diagnosis: 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 Reported MI in 2012 s/p 2 stents;  b. 08/2012 Cath: LM 20, LAD 20 diff ISR, jailed septal - 99%, LCX 30ost, RI large, min irregs, RCA 30p, 20-30 ISR-->Med Rx; c. 2017 Pt reports [facility].   CKD (chronic kidney disease), stage III (HCC)    Cocaine abuse (HCC)    COPD (chronic obstructive pulmonary disease) (HCC)    Depression    Diabetes mellitus without complication (HCC)    Hyperlipidemia    Hypertension    Hypertensive urgency 02/11/2021   Noncompliance    Past Surgical History:  Procedure Laterality Date   ABDOMINAL AORTOGRAM W/LOWER EXTREMITY N/A 04/27/2023   Procedure: ABDOMINAL AORTOGRAM W/LOWER EXTREMITY;  Surgeon: Maeola Harman, MD;  Location: [facility] INVASIVE CV LAB;  Service: Cardiovascular;  Laterality: N/A;   CARDIAC CATHETERIZATION     CORONARY STENT PLACEMENT     LEFT HEART CATH AND CORONARY ANGIOGRAPHY N/A 08/22/2020   Procedure: LEFT HEART CATH AND CORONARY ANGIOGRAPHY;  Surgeon: Lyn Records, MD;  Location: [facility] INVASIVE CV LAB;  Service: Cardiovascular;  Laterality: N/A;   LEFT HEART CATH AND CORONARY ANGIOGRAPHY N/A 01/28/2022   Procedure: LEFT HEART CATH AND CORONARY ANGIOGRAPHY;  Surgeon: Lennette Bihari, MD;  Location: [facility] INVASIVE CV LAB;  Service: Cardiovascular;  Laterality: N/A;   LEFT HEART CATHETERIZATION WITH CORONARY ANGIOGRAM Bilateral 08/25/2012   Procedure: LEFT HEART CATHETERIZATION WITH CORONARY ANGIOGRAM;  Surgeon: Kathleene Hazel, MD;  Location: [facility] CATH LAB;  Service: Cardiovascular;  Laterality: Bilateral;   PERIPHERAL INTRAVASCULAR LITHOTRIPSY  04/27/2023   Procedure: PERIPHERAL INTRAVASCULAR LITHOTRIPSY;  Surgeon: Maeola Harman, MD;  Location: [facility] INVASIVE CV LAB;  Service: Cardiovascular;;  PERIPHERAL VASCULAR BALLOON ANGIOPLASTY  04/27/2023   Procedure: PERIPHERAL VASCULAR BALLOON ANGIOPLASTY;  Surgeon: Maeola Harman, MD;  Location: [facility] INVASIVE CV LAB;  Service: Cardiovascular;;     A IV Location/Drains/Wounds Patient Lines/Drains/Airways Status     Active Line/Drains/Airways     Name Placement date Placement time Site Days   Peripheral IV 05/09/23 20 G Anterior;Left Forearm 05/09/23  1045   Forearm  less than 1   Wound / Incision (Open or Dehisced) 04/24/23 Other (Comment) Toe (Comment  which one) Anterior;Right gangrene right 2cnd 3rd toes 04/24/23  1900  Toe (Comment  which one)  15            Intake/Output Last 24 hours  Intake/Output Summary (Last 24 hours) at 05/09/2023 1431 Last data filed at 05/09/2023 1315 Gross per 24 hour  Intake --  Output 600 ml  Net -600 ml    Labs/Imaging Results for orders placed or performed during the hospital encounter of 05/09/23 (from the past 48 hour(s))  Comprehensive metabolic panel     Status: Abnormal   Collection Time: 05/09/23 10:14 AM  Result Value Ref Range   Sodium 121 (L) 135 - 145 mmol/L   Potassium 4.9 3.5 - 5.1 mmol/L   Chloride 91 (L) 98 - 111 mmol/L   CO2 22 22 - 32 mmol/L   Glucose, Bld 385 (H) 70 - 99 mg/dL    Comment: Glucose reference range applies only to samples taken after fasting for at least 8 hours.   BUN 26 (H) 8 - 23 mg/dL   Creatinine, Ser 7.56 (H) 0.61 - 1.24 mg/dL   Calcium 9.6 8.9 - 43.3 mg/dL   Total Protein 7.1 6.5 - 8.1 g/dL   Albumin 4.1 3.5 - 5.0 g/dL   AST 28 15 - 41 U/L   ALT 22 0 - 44 U/L   Alkaline Phosphatase 89 38 - 126 U/L   Total Bilirubin 0.7 0.3 - 1.2 mg/dL   GFR, Estimated 44 (L) >60 mL/min    Comment: (NOTE) Calculated using the CKD-EPI Creatinine Equation (2021)    Anion gap 8 5 - 15    Comment: Performed at [facility], [address] [place], [place] [address]  CBC with Differential     Status: Abnormal   Collection Time: 05/09/23 10:14 AM  Result Value Ref Range   WBC 9.6 4.0 - 10.5 K/uL   RBC 5.03 4.22 - 5.81 MIL/uL   Hemoglobin 14.2 13.0 - 17.0 g/dL   HCT 84.1 (L) 66.0 - 63.0 %   MCV 76.9 (L) 80.0 - 100.0 fL   MCH 28.2 26.0 - 34.0 pg   MCHC 36.7 (H) 30.0 - 36.0 g/dL   RDW 16.0 10.9 - 32.3 %   Platelets 276 150 - 400 K/uL   nRBC 0.0 0.0 - 0.2 %   Neutrophils Relative % 81 %   Neutro Abs 7.7 1.7 - 7.7 K/uL   Lymphocytes Relative 15 %   Lymphs Abs  1.4 0.7 - 4.0 K/uL   Monocytes Relative 2 %   Monocytes Absolute 0.2 0.1 - 1.0 K/uL   Eosinophils Relative 2 %   Eosinophils Absolute 0.2 0.0 - 0.5 K/uL   Basophils Relative 0 %   Basophils Absolute 0.0 0.0 - 0.1 K/uL   Immature Granulocytes 0 %   Abs Immature Granulocytes 0.04 0.00 - 0.07 K/uL    Comment: Performed at [facility], [address] [place], [place] [address]   No  results found.  Pending Labs Unresulted Labs (From admission, onward)     Start     Ordered   05/10/23 0500  Basic metabolic panel  Tomorrow morning,   R        05/09/23 1245   05/10/23 0500  CBC  Tomorrow morning,   R        05/09/23 1245   05/09/23 1244  Blood Cultures x 2 sites  (COPD / Pneumonia / Cellulitis / Lower Extremity Wound)  BLOOD CULTURE X 2,   R      05/09/23 1245   05/09/23 1244  Lactic acid  (COPD / Pneumonia / Cellulitis / Lower Extremity Wound)  Once,   R        05/09/23 1245   05/09/23 1243  Sedimentation rate  (COPD / Pneumonia / Cellulitis / Lower Extremity Wound)  Once,   R        05/09/23 1245   05/09/23 1243  C-reactive protein  (COPD / Pneumonia / Cellulitis / Lower Extremity Wound)  Once,   R        05/09/23 1245   05/09/23 1243  Prealbumin  (COPD / Pneumonia / Cellulitis / Lower Extremity Wound)  Once,   R        05/09/23 1245            Vitals/Pain Today's Vitals   05/09/23 1130 05/09/23 1200 05/09/23 1427 05/09/23 1429  BP: (!) 143/78 (!) 175/75 124/78   Pulse: 69 73 84   Resp:   18   Temp:   98.1 F (36.7 C)   TempSrc:   Oral   SpO2: 98% 99% 100%   Weight:  81.6 kg    PainSc:   10-Worst pain ever 10-Worst pain ever    Isolation Precautions No active isolations  Medications Medications  aspirin EC tablet 81 mg (has no administration in time range)  amLODipine (NORVASC) tablet 10 mg (has no administration in time range)  atorvastatin (LIPITOR) tablet 80 mg (has no administration in time range)  carvedilol (COREG) tablet 12.5 mg (has no  administration in time range)  hydrALAZINE (APRESOLINE) tablet 100 mg (100 mg Oral Given 05/09/23 1357)  traZODone (DESYREL) tablet 75 mg (has no administration in time range)  insulin glargine-yfgn (SEMGLEE) injection 25 Units (has no administration in time range)  pantoprazole (PROTONIX) EC tablet 40 mg (has no administration in time range)  polyethylene glycol (MIRALAX / GLYCOLAX) packet 17 g (17 g Oral Not Given 05/09/23 1339)  senna-docusate (Senokot-S) tablet 1 tablet (has no administration in time range)  gabapentin (NEURONTIN) capsule 300 mg (has no administration in time range)  magnesium oxide (MAG-OX) tablet 400 mg (400 mg Oral Given 05/09/23 1357)  multivitamin with minerals tablet 1 tablet (1 tablet Oral Given 05/09/23 1357)  mometasone-formoterol (DULERA) 200-5 MCG/ACT inhaler 2 puff (has no administration in time range)  brinzolamide (AZOPT) 1 % ophthalmic suspension 1 drop (has no administration in time range)    And  brimonidine (ALPHAGAN) 0.2 % ophthalmic solution 1 drop (has no administration in time range)  latanoprost (XALATAN) 0.005 % ophthalmic solution 1 drop (has no administration in time range)  cefTRIAXone (ROCEPHIN) 2 g in sodium chloride 0.9 % 100 mL IVPB (0 g Intravenous Stopped 05/09/23 1423)  metroNIDAZOLE (FLAGYL) IVPB 500 mg (500 mg Intravenous New Bag/Given 05/09/23 1430)  insulin aspart (novoLOG) injection 0-5 Units (has no administration in time range)  enoxaparin (LOVENOX) injection 40 mg (has no administration in time range)  0.9 %  sodium chloride infusion ( Intravenous New Bag/Given 05/09/23 1327)  acetaminophen (TYLENOL) tablet 650 mg (has no administration in time range)    Or  acetaminophen (TYLENOL) suppository 650 mg (has no administration in time range)  morphine (PF) 2 MG/ML injection 2 mg (has no administration in time range)  docusate sodium (COLACE) capsule 100 mg (has no administration in time range)  polyethylene glycol (MIRALAX / GLYCOLAX)  packet 17 g (has no administration in time range)  bisacodyl (DULCOLAX) EC tablet 5 mg (has no administration in time range)  ondansetron (ZOFRAN) tablet 4 mg (has no administration in time range)    Or  ondansetron (ZOFRAN) injection 4 mg (has no administration in time range)  hydrALAZINE (APRESOLINE) injection 5 mg (has no administration in time range)  LORazepam (ATIVAN) tablet 1-4 mg (has no administration in time range)    Or  LORazepam (ATIVAN) injection 1-4 mg (has no administration in time range)  thiamine (VITAMIN B1) tablet 100 mg (100 mg Oral Given 05/09/23 1343)    Or  thiamine (VITAMIN B1) injection 100 mg ( Intravenous See Alternative 05/09/23 1343)  folic acid (FOLVITE) tablet 1 mg (1 mg Oral Given 05/09/23 1331)  insulin aspart (novoLOG) injection 0-9 Units (has no administration in time range)  oxyCODONE (Oxy IR/ROXICODONE) immediate release tablet 5 mg (has no administration in time range)  vancomycin (VANCOREADY) IVPB 1750 mg/350 mL (has no administration in time range)  vancomycin (VANCOREADY) IVPB 1250 mg/250 mL (has no administration in time range)  albuterol (PROVENTIL) (2.5 MG/3ML) 0.083% nebulizer solution 2.5 mg (has no administration in time range)  hydrOXYzine (ATARAX) tablet 25 mg (has no administration in time range)  sodium chloride 0.9 % bolus 500 mL (0 mLs Intravenous Stopped 05/09/23 1315)  HYDROmorphone (DILAUDID) injection 1 mg (1 mg Intravenous Given 05/09/23 1053)  ondansetron (ZOFRAN) injection 4 mg (4 mg Intravenous Given 05/09/23 1053)    Mobility walks with device     Focused Assessments Gangrene green right 2nd and 3rd toes   R Recommendations: See Admitting Provider Note  Report given to:   Additional Notes Patient  is alert oriented  uses urinal hasa been walking with a coane the past 2 weeks

## 2023-05-09 NOTE — Progress Notes (Signed)
VASCULAR LAB    Right lower extremity arterial duplex has been performed.  See CV proc for preliminary results.   Stevi Hollinshead, RVT 05/09/2023, 6:18 PM

## 2023-05-09 NOTE — ED Triage Notes (Signed)
Pt came in via POV d/t 3 toes on his Rt foot being necrotic & black, redness around the area & pt states he noticed it 3 days ago. Does report he had surgery to remove a clot in his Rt leg last week. A/Ox4, rates pain 10/10, uses cane to ambulate.

## 2023-05-09 NOTE — Consult Note (Signed)
[facility] [place]  ASSESSMENT / PLAN: Jose [name] is a 62 y.o. 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  Recommend the following which can slow the progression of atherosclerosis and reduce the risk of major adverse cardiac / limb events:  Complete cessation from all tobacco products. Blood glucose control with goal A1c < 7%. Blood pressure control with goal blood pressure < 140/90 mmHg. Lipid reduction therapy with goal LDL-C <100 mg/dL (<16 if symptomatic from PAD).  Aspirin 81mg  PO QD.  Clopidogrel 75mg  PO QD. Atorvastatin 40-80mg  PO QD (or other "high intensity" statin therapy).  Good technical result at angiogram 04/27/23, but foot is deteriorating. I will check non-invasive studies. He may need repeat angiogram early next week.  CHIEF COMPLAINT: worsening right foot  HISTORY OF PRESENT ILLNESS: Jose [name] is a 62 y.o. male admitted to the internal medicine service for worsening ulceration and gangrene of the right forefoot.  The patient underwent endovascular intervention on a critical popliteal lesion on 04/27/2023.  Good technical result was achieved.  Unfortunately, the foot has continued to deteriorate.  The foot is very painful to him. Past Medical History:  Diagnosis Date   Alcohol abuse    CAD (coronary artery disease)    a. Reported MI in 2012 s/p 2 stents;  b. 08/2012 Cath: LM 20, LAD 20 diff ISR, jailed septal - 99%, LCX 30ost, RI large, min irregs, RCA 30p, 20-30 ISR-->Med Rx; c. 2017 [facility]   CKD (chronic kidney disease), stage III (HCC)    Cocaine abuse (HCC)    COPD (chronic obstructive pulmonary disease) (HCC)    Depression    Diabetes mellitus without complication (HCC)    Hyperlipidemia    Hypertension    Hypertensive urgency 02/11/2021   Noncompliance     Past Surgical History:  Procedure Laterality Date   ABDOMINAL AORTOGRAM W/LOWER EXTREMITY N/A  04/27/2023   Procedure: ABDOMINAL AORTOGRAM W/LOWER EXTREMITY;  Surgeon: Maeola Harman, MD;  Location: [facility] INVASIVE CV LAB;  Service: Cardiovascular;  Laterality: N/A;   CARDIAC CATHETERIZATION     CORONARY STENT PLACEMENT     LEFT HEART CATH AND CORONARY ANGIOGRAPHY N/A 08/22/2020   Procedure: LEFT HEART CATH AND CORONARY ANGIOGRAPHY;  Surgeon: Lyn Records, MD;  Location: [facility] INVASIVE CV LAB;  Service: Cardiovascular;  Laterality: N/A;   LEFT HEART CATH AND CORONARY ANGIOGRAPHY N/A 01/28/2022   Procedure: LEFT HEART CATH AND CORONARY ANGIOGRAPHY;  Surgeon: Lennette Bihari, MD;  Location: [facility] INVASIVE CV LAB;  Service: Cardiovascular;  Laterality: N/A;   LEFT HEART CATHETERIZATION WITH CORONARY ANGIOGRAM Bilateral 08/25/2012   Procedure: LEFT HEART CATHETERIZATION WITH CORONARY ANGIOGRAM;  Surgeon: Kathleene Hazel, MD;  Location: [facility] CATH LAB;  Service: Cardiovascular;  Laterality: Bilateral;   PERIPHERAL INTRAVASCULAR LITHOTRIPSY  04/27/2023   Procedure: PERIPHERAL INTRAVASCULAR LITHOTRIPSY;  Surgeon: Maeola Harman, MD;  Location: [facility] INVASIVE CV LAB;  Service: Cardiovascular;;   PERIPHERAL VASCULAR BALLOON ANGIOPLASTY  04/27/2023   Procedure: PERIPHERAL VASCULAR BALLOON ANGIOPLASTY;  Surgeon: Maeola Harman, MD;  Location: [facility] INVASIVE CV LAB;  Service: Cardiovascular;;    Family History  Problem Relation Age of Onset   Heart disease Mother        MI 65s    Social History   Socioeconomic History   Marital status: Married    Spouse name: Not on file   Number of children: Not on file  Years of education: Not on file   Highest education level: Not on file  Occupational History   Not on file  Tobacco Use   Smoking status: Former    Types: Cigarettes    Quit date: 11/25/2007    Years since quitting: 15.4   Smokeless tobacco: Never  Vaping Use   Vaping Use: Never used  Substance and Sexual Activity   Alcohol use: Yes    Comment: Used to drink heavily  - says currently 2 beers/day.   Drug use: Yes    Types: Marijuana    Comment: reports not using cocaine   Sexual activity: Yes    Birth control/protection: None  Other Topics Concern   Not on file  Social History Narrative   Lives in [place] by himself.  Does not work or routinely exercise.   Social Determinants of Health   Financial Resource Strain: Not on file  Food Insecurity: No Food Insecurity (05/09/2023)   Hunger Vital Sign    Worried About Running Out of Food in the Last Year: Never true    Ran Out of Food in the Last Year: Never true  Transportation Needs: No Transportation Needs (05/09/2023)   PRAPARE - Administrator, Civil Service (Medical): No    Lack of Transportation (Non-Medical): No  Physical Activity: Not on file  Stress: Not on file  Social Connections: Not on file  Intimate Partner Violence: Not At Risk (05/09/2023)   Humiliation, Afraid, Rape, and Kick questionnaire    Fear of Current or Ex-Partner: No    Emotionally Abused: No    Physically Abused: No    Sexually Abused: No    Allergies  Allergen Reactions   Lisinopril-Hydrochlorothiazide Swelling and Other (See Comments)    Causes swelling of lips   Simvastatin Other (See Comments)    ALT (SGPT) level raised, Aspartate aminotransferase serum level raised    Current Facility-Administered Medications  Medication Dose Route Frequency Provider Last Rate Last Admin   0.9 %  sodium chloride infusion   Intravenous Continuous Jonah Blue, MD 75 mL/hr at 05/09/23 1327 New Bag at 05/09/23 1327   acetaminophen (TYLENOL) tablet 650 mg  650 mg Oral Q6H PRN Jonah Blue, MD       Or   acetaminophen (TYLENOL) suppository 650 mg  650 mg Rectal Q6H PRN Jonah Blue, MD       albuterol (PROVENTIL) (2.5 MG/3ML) 0.083% nebulizer solution 2.5 mg  2.5 mg Nebulization Q6H PRN Francena Hanly, RPH       [START ON 05/10/2023] amLODipine (NORVASC) tablet 10 mg  10 mg Oral Daily Jonah Blue, MD        [START ON 05/10/2023] aspirin EC tablet 81 mg  81 mg Oral Daily Jonah Blue, MD       atorvastatin (LIPITOR) tablet 80 mg  80 mg Oral Daily Jonah Blue, MD       bisacodyl (DULCOLAX) EC tablet 5 mg  5 mg Oral Daily PRN Jonah Blue, MD       brinzolamide (AZOPT) 1 % ophthalmic suspension 1 drop  1 drop Both Eyes TID Jonah Blue, MD       And   brimonidine (ALPHAGAN) 0.2 % ophthalmic solution 1 drop  1 drop Both Eyes TID Jonah Blue, MD       carvedilol (COREG) tablet 12.5 mg  12.5 mg Oral BID WC Jonah Blue, MD   12.5 mg at 05/09/23 1640   cefTRIAXone (ROCEPHIN) 2 g in sodium chloride 0.9 %  100 mL IVPB  2 g Intravenous Q24H Jonah Blue, MD   Stopped at 05/09/23 1423   docusate sodium (COLACE) capsule 100 mg  100 mg Oral BID Jonah Blue, MD       enoxaparin (LOVENOX) injection 40 mg  40 mg Subcutaneous Q24H Jonah Blue, MD       folic acid (FOLVITE) tablet 1 mg  1 mg Oral Daily Jonah Blue, MD   1 mg at 05/09/23 1331   gabapentin (NEURONTIN) capsule 300 mg  300 mg Oral QPM Jonah Blue, MD   300 mg at 05/09/23 1645   hydrALAZINE (APRESOLINE) injection 5 mg  5 mg Intravenous Q4H PRN Jonah Blue, MD       hydrALAZINE (APRESOLINE) tablet 100 mg  100 mg Oral BID Jonah Blue, MD   100 mg at 05/09/23 1357   [START ON 05/10/2023] hydrOXYzine (ATARAX) tablet 25 mg  25 mg Oral [id number] Francena Hanly, RPH       insulin aspart (novoLOG) injection 0-5 Units  0-5 Units Subcutaneous QHS Jonah Blue, MD       insulin aspart (novoLOG) injection 0-9 Units  0-9 Units Subcutaneous TID WC Jonah Blue, MD   5 Units at 05/09/23 1636   insulin glargine-yfgn (SEMGLEE) injection 25 Units  25 Units Subcutaneous QHS Jonah Blue, MD       latanoprost (XALATAN) 0.005 % ophthalmic solution 1 drop  1 drop Both Eyes QHS Jonah Blue, MD       LORazepam (ATIVAN) tablet 1-4 mg  1-4 mg Oral Q1H PRN Jonah Blue, MD       Or   LORazepam (ATIVAN) injection 1-4 mg   1-4 mg Intravenous Q1H PRN Jonah Blue, MD       magnesium oxide (MAG-OX) tablet 400 mg  400 mg Oral Daily Jonah Blue, MD   400 mg at 05/09/23 1357   metroNIDAZOLE (FLAGYL) IVPB 500 mg  500 mg Intravenous Bobette Mo, MD   Stopped at 05/09/23 1553   mometasone-formoterol (DULERA) 200-5 MCG/ACT inhaler 2 puff  2 puff Inhalation BID Jonah Blue, MD       morphine (PF) 2 MG/ML injection 2 mg  2 mg Intravenous Q2H PRN Jonah Blue, MD   2 mg at 05/09/23 1436   multivitamin with minerals tablet 1 tablet  1 tablet Oral Daily Jonah Blue, MD   1 tablet at 05/09/23 1357   ondansetron (ZOFRAN) tablet 4 mg  4 mg Oral Q6H PRN Jonah Blue, MD       Or   ondansetron [facility]) injection 4 mg  4 mg Intravenous Q6H PRN Jonah Blue, MD       oxyCODONE (Oxy IR/ROXICODONE) immediate release tablet 5 mg  5 mg Oral Q4H PRN Jonah Blue, MD   5 mg at 05/09/23 1641   pantoprazole (PROTONIX) EC tablet 40 mg  40 mg Oral BID Magda Paganini, MD   40 mg at 05/09/23 1640   polyethylene glycol (MIRALAX / GLYCOLAX) packet 17 g  17 g Oral BID Jonah Blue, MD       polyethylene glycol (MIRALAX / GLYCOLAX) packet 17 g  17 g Oral Daily PRN Jonah Blue, MD       senna-docusate (Senokot-S) tablet 1 tablet  1 tablet Oral BID Jonah Blue, MD       thiamine (VITAMIN B1) tablet 100 mg  100 mg Oral Daily Jonah Blue, MD   100 mg at 05/09/23 1343   Or   thiamine (VITAMIN B1) injection 100  mg  100 mg Intravenous Daily Jonah Blue, MD       traZODone (DESYREL) tablet 75 mg  75 mg Oral Noemi Chapel, MD       [START ON 05/10/2023] vancomycin (VANCOREADY) IVPB 1250 mg/250 mL  1,250 mg Intravenous Q24H Francena Hanly, RPH       vancomycin [facility]) IVPB 1750 mg/350 mL  1,750 mg Intravenous Once Francena Hanly, RPH 175 mL/hr at 05/09/23 1600 1,750 mg at 05/09/23 1600    PHYSICAL EXAM Vitals:   05/09/23 1130 05/09/23 1200 05/09/23 1427 05/09/23 1627  BP: (!) 143/78  (!) 175/75 124/78 (!) 149/82  Pulse: 69 73 84 91  Resp:   18 20  Temp:   98.1 F (36.7 C) 97.9 F (36.6 C)  TempSrc:   Oral Oral  SpO2: 98% 99% 100% 98%  Weight:  81.6 kg     Well-appearing man in no acute distress Regular rate and rhythm Unlabored breathing No palpable right popliteal pulse No palpable right pedal pulses Gangrene of the right second and third toe   PERTINENT LABORATORY AND RADIOLOGIC DATA  Most recent CBC    Latest Ref Rng & Units 05/09/2023   10:14 AM 04/29/2023    2:40 AM 04/28/2023    5:11 AM  CBC  WBC 4.0 - 10.5 K/uL 9.6  8.5  8.1   Hemoglobin 13.0 - 17.0 g/dL 56.2  13.0  86.5   Hematocrit 39.0 - 52.0 % 38.7  35.0  34.9   Platelets 150 - 400 K/uL 276  170  185      Most recent CMP    Latest Ref Rng & Units 05/09/2023   10:14 AM 04/29/2023    2:40 AM 04/28/2023    5:11 AM  CMP  Glucose 70 - 99 mg/dL 784  696  295   BUN 8 - 23 mg/dL 26  29  27    Creatinine 0.61 - 1.24 mg/dL 2.84  1.32  4.40   Sodium 135 - 145 mmol/L 121  131  129   Potassium 3.5 - 5.1 mmol/L 4.9  4.5  4.7   Chloride 98 - 111 mmol/L 91  101  99   CO2 22 - 32 mmol/L 22  21  19    Calcium 8.9 - 10.3 mg/dL 9.6  9.3  9.3   Total Protein 6.5 - 8.1 g/dL 7.1  6.2  5.9   Total Bilirubin 0.3 - 1.2 mg/dL 0.7  0.6  1.0   Alkaline Phos 38 - 126 U/L 89  62  60   AST 15 - 41 U/L 28  24  35   ALT 0 - 44 U/L 22  26  25      Renal function Estimated Creatinine Clearance: 45.9 mL/min (A) (by C-G formula based on SCr of 1.73 mg/dL (H)).  Hgb A1c MFr Bld (%)  Date Value  04/24/2023 7.7 (H)    LDL Chol Calc (NIH)  Date Value Ref Range Status  12/14/2020 73 0 - 99 mg/dL Final   LDL Cholesterol  Date Value Ref Range Status  04/28/2023 38 0 - 99 mg/dL Final    Comment:           Total Cholesterol/HDL:CHD Risk Coronary Heart Disease Risk Table                     Men   Women  1/2 Average Risk   3.4   3.3  Average Risk  5.0   4.4  2 X Average Risk   9.6   7.1  3 X Average Risk  23.4    11.0        Use the calculated Patient Ratio above and the CHD Risk Table to determine the patient's CHD Risk.        ATP III CLASSIFICATION (LDL):  <100     mg/dL   Optimal  409-811  mg/dL   Near or Above                    Optimal  130-159  mg/dL   Borderline  914-782  mg/dL   High  >956     mg/dL   Very High Performed at [facility], [address] [place], [place] [address]     Rande Brunt. Lenell Antu, MD FACS [facility] Phone Number: [phone] 05/09/2023 4:51 PM   Total time spent on preparing this encounter including chart review, data review, collecting history, examining the patient, coordinating care for this established patient, 40 minutes  Portions of this report may have been transcribed using voice recognition software.  Every effort has been made to ensure accuracy; however, inadvertent computerized transcription errors may still be present.

## 2023-05-09 NOTE — ED Notes (Signed)
5n called was told no nurse was assigned yet would call me back  22 minutes since assignment

## 2023-05-09 NOTE — Progress Notes (Signed)
VASCULAR LAB    ABI has been performed.  See CV proc for preliminary results.   Avalon Coppinger, RVT 05/09/2023, 6:18 PM

## 2023-05-09 NOTE — ED Provider Notes (Addendum)
Emergency Department Provider Note   I have reviewed the triage vital signs and the nursing notes.   HISTORY  Chief Complaint Necrotic Toes Rt Foot   HPI Jose [name] is a 62 y.o. male with PMH of CAD, CKD, COPD, HTN, HLD presents to the ED with worsening pain to the toes in the right foot. Denies fever. Has been taking medication as prescribed but pain symptoms are severe. He feels like the discoloration is now much worse. Denies injury.    Past Medical History:  Diagnosis Date   Alcohol abuse    CAD (coronary artery disease)    a. Reported MI in 2012 s/p 2 stents;  b. 08/2012 Cath: LM 20, LAD 20 diff ISR, jailed septal - 99%, LCX 30ost, RI large, min irregs, RCA 30p, 20-30 ISR-->Med Rx; c. 2017 [facility]   CKD (chronic kidney disease), stage III (HCC)    Cocaine abuse (HCC)    COPD (chronic obstructive pulmonary disease) (HCC)    Depression    Diabetes mellitus without complication (HCC)    Hyperlipidemia    Hypertension    Hypertensive urgency 02/11/2021   Noncompliance     Review of Systems  Constitutional: No fever/chills Cardiovascular: Denies chest pain. Respiratory: Denies shortness of breath. Gastrointestinal: No abdominal pain.  Musculoskeletal: Right foot pain.  Skin: Negative for rash. Neurological: Negative for headaches.  ____________________________________________   PHYSICAL EXAM:  VITAL SIGNS: ED Triage Vitals  Enc Vitals Group     BP 05/09/23 0951 (!) 144/79     Pulse Rate 05/09/23 0951 73     Resp 05/09/23 0951 18     Temp 05/09/23 0951 97.8 F (36.6 C)     Temp Source 05/09/23 0951 Oral     SpO2 05/09/23 0951 99 %   Constitutional: Alert and oriented. Well appearing and in no acute distress. Eyes: Conjunctivae are normal.  Head: Atraumatic. Nose: No congestion/rhinnorhea. Mouth/Throat: Mucous membranes are moist.  Neck: No stridor.   Cardiovascular: Normal rate, regular rhythm. Palpable DP/PT pulse on  the right. Grossly normal heart sounds.   Respiratory: Normal respiratory effort.  No retractions. Lungs CTAB. Gastrointestinal: Soft and nontender. No distention.  Musculoskeletal: No lower extremity tenderness nor edema. No gross deformities of extremities. Neurologic:  Normal speech and language. No gross focal neurologic deficits are appreciated.  Skin:  Skin is warm and dry. Worsening discoloration to toes 1-3. No foot cellulitis or purulent drainage.      ____________________________________________   LABS (all labs ordered are listed, but only abnormal results are displayed)  Labs Reviewed  COMPREHENSIVE METABOLIC PANEL - Abnormal; Notable for the following components:      Result Value   Sodium 121 (*)    Chloride 91 (*)    Glucose, Bld 385 (*)    BUN 26 (*)    Creatinine, Ser 1.73 (*)    GFR, Estimated 44 (*)    All other components within normal limits  CBC WITH DIFFERENTIAL/PLATELET - Abnormal; Notable for the following components:   HCT 38.7 (*)    MCV 76.9 (*)    MCHC 36.7 (*)    All other components within normal limits  CULTURE, BLOOD (ROUTINE X 2)  CULTURE, BLOOD (ROUTINE X 2)  SEDIMENTATION RATE  C-REACTIVE PROTEIN  PREALBUMIN  LACTIC ACID, PLASMA    ____________________________________________   PROCEDURES  Procedure(s) performed:   Procedures  None  ____________________________________________   INITIAL IMPRESSION / ASSESSMENT AND PLAN / ED  COURSE  Pertinent labs & imaging results that were available during my care of the patient were reviewed by me and considered in my medical decision making (see chart for details).   This patient is Presenting for Evaluation of foot pain, which does require a range of treatment options, and is a complaint that involves a high risk of morbidity and mortality.  The Differential Diagnoses include dry gangrene, ischemic extremity, sepsis, etc.  Critical Interventions-    Medications  aspirin EC tablet 81  mg (has no administration in time range)  amLODipine (NORVASC) tablet 10 mg (has no administration in time range)  atorvastatin (LIPITOR) tablet 80 mg (has no administration in time range)  carvedilol (COREG) tablet 12.5 mg (has no administration in time range)  hydrALAZINE (APRESOLINE) tablet 100 mg (has no administration in time range)  hydrOXYzine (VISTARIL) capsule 25 mg (has no administration in time range)  traZODone (DESYREL) tablet 75 mg (has no administration in time range)  insulin glargine-yfgn (SEMGLEE) injection 25 Units (has no administration in time range)  pantoprazole (PROTONIX) EC tablet 40 mg (has no administration in time range)  polyethylene glycol (MIRALAX / GLYCOLAX) packet 17 g (has no administration in time range)  senna-docusate (Senokot-S) tablet 1 tablet (has no administration in time range)  gabapentin (NEURONTIN) capsule 300 mg (has no administration in time range)  magnesium oxide (MAG-OX) tablet 400 mg (has no administration in time range)  multivitamin with minerals tablet 1 tablet (has no administration in time range)  albuterol (VENTOLIN HFA) 108 (90 Base) MCG/ACT inhaler 2 puff (has no administration in time range)  mometasone-formoterol (DULERA) 200-5 MCG/ACT inhaler 2 puff (has no administration in time range)  brinzolamide (AZOPT) 1 % ophthalmic suspension 1 drop (has no administration in time range)    And  brimonidine (ALPHAGAN) 0.2 % ophthalmic solution 1 drop (has no administration in time range)  latanoprost (XALATAN) 0.005 % ophthalmic solution 1 drop (has no administration in time range)  cefTRIAXone (ROCEPHIN) 2 g in sodium chloride 0.9 % 100 mL IVPB (has no administration in time range)  metroNIDAZOLE (FLAGYL) IVPB 500 mg (has no administration in time range)  insulin aspart (novoLOG) injection 0-5 Units (has no administration in time range)  enoxaparin (LOVENOX) injection 40 mg (has no administration in time range)  0.9 %  sodium chloride  infusion (has no administration in time range)  acetaminophen (TYLENOL) tablet 650 mg (has no administration in time range)    Or  acetaminophen (TYLENOL) suppository 650 mg (has no administration in time range)  morphine (PF) 2 MG/ML injection 2 mg (has no administration in time range)  docusate sodium (COLACE) capsule 100 mg (has no administration in time range)  polyethylene glycol (MIRALAX / GLYCOLAX) packet 17 g (has no administration in time range)  bisacodyl (DULCOLAX) EC tablet 5 mg (has no administration in time range)  ondansetron (ZOFRAN) tablet 4 mg (has no administration in time range)    Or  ondansetron (ZOFRAN) injection 4 mg (has no administration in time range)  hydrALAZINE (APRESOLINE) injection 5 mg (has no administration in time range)  LORazepam (ATIVAN) tablet 1-4 mg (has no administration in time range)    Or  LORazepam (ATIVAN) injection 1-4 mg (has no administration in time range)  thiamine (VITAMIN B1) tablet 100 mg (has no administration in time range)    Or  thiamine (VITAMIN B1) injection 100 mg (has no administration in time range)  folic acid (FOLVITE) tablet 1 mg (has no administration in time  range)  insulin aspart (novoLOG) injection 0-9 Units (has no administration in time range)  oxyCODONE (Oxy IR/ROXICODONE) immediate release tablet 5 mg (has no administration in time range)  sodium chloride 0.9 % bolus 500 mL (500 mLs Intravenous New Bag/Given 05/09/23 1051)  HYDROmorphone (DILAUDID) injection 1 mg (1 mg Intravenous Given 05/09/23 1053)  ondansetron (ZOFRAN) injection 4 mg (4 mg Intravenous Given 05/09/23 1053)    Reassessment after intervention:  pain improved.   I decided to review pertinent External Data, and in summary patient discharged from the hospital on 6/5 with dry gangrene.  Had vascular intervention for improved blood flow during that hospitalization with Dr. Randie Heinz.   Clinical Laboratory Tests Ordered, included Na or 121 but corrected for  hyperglycemia is 128. CKD similar to prior. No leukocytosis.   Cardiac Monitor Tracing which shows NSR.    Social Determinants of Health Risk patient is not an active smoker.   Consult complete with Dr. Lenell Antu with vascular surgery. He will consult.   TRH, Dr. Ophelia Charter. Plan for admit.   Medical Decision Making: Summary:  Presents emergency department with worsening pain and discoloration. Will discuss with vascular surgery but no evidence of acute limb ischemia. Question if he will need amputation given progression of symptoms.   Reevaluation with update and discussion with patient. Pain control better. Plan for admit. Patient in agreement.   Patient's presentation is most consistent with acute presentation with potential threat to life or bodily function.   Disposition: admit  ____________________________________________  FINAL CLINICAL IMPRESSION(S) / ED DIAGNOSES  Final diagnoses:  Dry gangrene (HCC)    Note:  This document was prepared using Dragon voice recognition software and may include unintentional dictation errors.  Alona Bene, MD, FACEP Emergency Medicine    [place]ington Devine, Arlyss Repress, MD 05/09/23 1249    Maia Plan, MD 05/09/23 1250

## 2023-05-09 NOTE — H&P (View-Only) (Signed)
[facility] [place]  ASSESSMENT / PLAN: Jose [name] is a 62 y.o. 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  Recommend the following which can slow the progression of atherosclerosis and reduce the risk of major adverse cardiac / limb events:  Complete cessation from all tobacco products. Blood glucose control with goal A1c < 7%. Blood pressure control with goal blood pressure < 140/90 mmHg. Lipid reduction therapy with goal LDL-C <100 mg/dL (<70 if symptomatic from PAD).  Aspirin 81mg PO QD.  Clopidogrel 75mg PO QD. Atorvastatin 40-80mg PO QD (or other "high intensity" statin therapy).  Good technical result at angiogram 04/27/23, but foot is deteriorating. I will check non-invasive studies. He may need repeat angiogram early next week.  CHIEF COMPLAINT: worsening right foot  HISTORY OF PRESENT ILLNESS: Jose [name] is a 62 y.o. male admitted to the internal medicine service for worsening ulceration and gangrene of the right forefoot.  The patient underwent endovascular intervention on a critical popliteal lesion on 04/27/2023.  Good technical result was achieved.  Unfortunately, the foot has continued to deteriorate.  The foot is very painful to him. Past Medical History:  Diagnosis Date   Alcohol abuse    CAD (coronary artery disease)    a. Reported MI in 2012 s/p 2 stents;  b. 08/2012 Cath: LM 20, LAD 20 diff ISR, jailed septal - 99%, LCX 30ost, RI large, min irregs, RCA 30p, 20-30 ISR-->Med Rx; c. 2017 [facility].   CKD (chronic kidney disease), stage III (HCC)    Cocaine abuse (HCC)    COPD (chronic obstructive pulmonary disease) (HCC)    Depression    Diabetes mellitus without complication (HCC)    Hyperlipidemia    Hypertension    Hypertensive urgency 02/11/2021   Noncompliance     Past Surgical History:  Procedure Laterality Date   ABDOMINAL AORTOGRAM W/LOWER EXTREMITY N/A  04/27/2023   Procedure: ABDOMINAL AORTOGRAM W/LOWER EXTREMITY;  Surgeon: Cain, Brandon Christopher, MD;  Location: [facility];  Service: Cardiovascular;  Laterality: N/A;   CARDIAC CATHETERIZATION     CORONARY STENT PLACEMENT     LEFT HEART CATH AND CORONARY ANGIOGRAPHY N/A 08/22/2020   Procedure: LEFT HEART CATH AND CORONARY ANGIOGRAPHY;  Surgeon: Smith, Henry W, MD;  Location: [facility];  Service: Cardiovascular;  Laterality: N/A;   LEFT HEART CATH AND CORONARY ANGIOGRAPHY N/A 01/28/2022   Procedure: LEFT HEART CATH AND CORONARY ANGIOGRAPHY;  Surgeon: Kelly, Marijean Montanye A, MD;  Location: [facility];  Service: Cardiovascular;  Laterality: N/A;   LEFT HEART CATHETERIZATION WITH CORONARY ANGIOGRAM Bilateral 08/25/2012   Procedure: LEFT HEART CATHETERIZATION WITH CORONARY ANGIOGRAM;  Surgeon: Christopher D McAlhany, MD;  Location: [facility];  Service: Cardiovascular;  Laterality: Bilateral;   PERIPHERAL INTRAVASCULAR LITHOTRIPSY  04/27/2023   Procedure: PERIPHERAL INTRAVASCULAR LITHOTRIPSY;  Surgeon: Cain, Brandon Christopher, MD;  Location: [facility];  Service: Cardiovascular;;   PERIPHERAL VASCULAR BALLOON ANGIOPLASTY  04/27/2023   Procedure: PERIPHERAL VASCULAR BALLOON ANGIOPLASTY;  Surgeon: Cain, Brandon Christopher, MD;  Location: [facility];  Service: Cardiovascular;;    Family History  Problem Relation Age of Onset   Heart disease Mother        MI 70s    Social History   Socioeconomic History   Marital status: Married    Spouse name: Not on file   Number of children: Not on file     Years of education: Not on file   Highest education level: Not on file  Occupational History   Not on file  Tobacco Use   Smoking status: Former    Types: Cigarettes    Quit date: 11/25/2007    Years since quitting: 15.4   Smokeless tobacco: Never  Vaping Use   Vaping Use: Never used  Substance and Sexual Activity   Alcohol use: Yes    Comment: Used to drink heavily  - says currently 2 beers/day.   Drug use: Yes    Types: Marijuana    Comment: reports not using cocaine   Sexual activity: Yes    Birth control/protection: None  Other Topics Concern   Not on file  Social History Narrative   Lives in [place] by himself.  Does not work or routinely exercise.   Social Determinants of Health   Financial Resource Strain: Not on file  Food Insecurity: No Food Insecurity (05/09/2023)   Hunger Vital Sign    Worried About Running Out of Food in the Last Year: Never true    Ran Out of Food in the Last Year: Never true  Transportation Needs: No Transportation Needs (05/09/2023)   PRAPARE - Transportation    Lack of Transportation (Medical): No    Lack of Transportation (Non-Medical): No  Physical Activity: Not on file  Stress: Not on file  Social Connections: Not on file  Intimate Partner Violence: Not At Risk (05/09/2023)   Humiliation, Afraid, Rape, and Kick questionnaire    Fear of Current or Ex-Partner: No    Emotionally Abused: No    Physically Abused: No    Sexually Abused: No    Allergies  Allergen Reactions   Lisinopril-Hydrochlorothiazide Swelling and Other (See Comments)    Causes swelling of lips   Simvastatin Other (See Comments)    ALT (SGPT) level raised, Aspartate aminotransferase serum level raised    Current Facility-Administered Medications  Medication Dose Route Frequency Provider Last Rate Last Admin   0.9 %  sodium chloride infusion   Intravenous Continuous Yates, Jennifer, MD 75 mL/hr at 05/09/23 1327 New Bag at 05/09/23 1327   acetaminophen (TYLENOL) tablet 650 mg  650 mg Oral Q6H PRN Yates, Jennifer, MD       Or   acetaminophen (TYLENOL) suppository 650 mg  650 mg Rectal Q6H PRN Yates, Jennifer, MD       albuterol (PROVENTIL) (2.5 MG/3ML) 0.083% nebulizer solution 2.5 mg  2.5 mg Nebulization Q6H PRN Wilson, Heather W, RPH       [START ON 05/10/2023] amLODipine (NORVASC) tablet 10 mg  10 mg Oral Daily Yates, Jennifer, MD        [START ON 05/10/2023] aspirin EC tablet 81 mg  81 mg Oral Daily Yates, Jennifer, MD       atorvastatin (LIPITOR) tablet 80 mg  80 mg Oral Daily Yates, Jennifer, MD       bisacodyl (DULCOLAX) EC tablet 5 mg  5 mg Oral Daily PRN Yates, Jennifer, MD       brinzolamide (AZOPT) 1 % ophthalmic suspension 1 drop  1 drop Both Eyes TID Yates, Jennifer, MD       And   brimonidine (ALPHAGAN) 0.2 % ophthalmic solution 1 drop  1 drop Both Eyes TID Yates, Jennifer, MD       carvedilol (COREG) tablet 12.5 mg  12.5 mg Oral BID WC Yates, Jennifer, MD   12.5 mg at 05/09/23 1640   cefTRIAXone (ROCEPHIN) 2 g in sodium chloride 0.9 %   100 mL IVPB  2 g Intravenous Q24H Yates, Jennifer, MD   Stopped at 05/09/23 1423   docusate sodium (COLACE) capsule 100 mg  100 mg Oral BID Yates, Jennifer, MD       enoxaparin (LOVENOX) injection 40 mg  40 mg Subcutaneous Q24H Yates, Jennifer, MD       folic acid (FOLVITE) tablet 1 mg  1 mg Oral Daily Yates, Jennifer, MD   1 mg at 05/09/23 1331   gabapentin (NEURONTIN) capsule 300 mg  300 mg Oral QPM Yates, Jennifer, MD   300 mg at 05/09/23 1645   hydrALAZINE (APRESOLINE) injection 5 mg  5 mg Intravenous Q4H PRN Yates, Jennifer, MD       hydrALAZINE (APRESOLINE) tablet 100 mg  100 mg Oral BID Yates, Jennifer, MD   100 mg at 05/09/23 1357   [START ON 05/10/2023] hydrOXYzine (ATARAX) tablet 25 mg  25 mg Oral Q0600 Wilson, Heather W, RPH       insulin aspart (novoLOG) injection 0-5 Units  0-5 Units Subcutaneous QHS Yates, Jennifer, MD       insulin aspart (novoLOG) injection 0-9 Units  0-9 Units Subcutaneous TID WC Yates, Jennifer, MD   5 Units at 05/09/23 1636   insulin glargine-yfgn (SEMGLEE) injection 25 Units  25 Units Subcutaneous QHS Yates, Jennifer, MD       latanoprost (XALATAN) 0.005 % ophthalmic solution 1 drop  1 drop Both Eyes QHS Yates, Jennifer, MD       LORazepam (ATIVAN) tablet 1-4 mg  1-4 mg Oral Q1H PRN Yates, Jennifer, MD       Or   LORazepam (ATIVAN) injection 1-4 mg   1-4 mg Intravenous Q1H PRN Yates, Jennifer, MD       magnesium oxide (MAG-OX) tablet 400 mg  400 mg Oral Daily Yates, Jennifer, MD   400 mg at 05/09/23 1357   metroNIDAZOLE (FLAGYL) IVPB 500 mg  500 mg Intravenous Q8H Yates, Jennifer, MD   Stopped at 05/09/23 1553   mometasone-formoterol (DULERA) 200-5 MCG/ACT inhaler 2 puff  2 puff Inhalation BID Yates, Jennifer, MD       morphine (PF) 2 MG/ML injection 2 mg  2 mg Intravenous Q2H PRN Yates, Jennifer, MD   2 mg at 05/09/23 1436   multivitamin with minerals tablet 1 tablet  1 tablet Oral Daily Yates, Jennifer, MD   1 tablet at 05/09/23 1357   ondansetron (ZOFRAN) tablet 4 mg  4 mg Oral Q6H PRN Yates, Jennifer, MD       Or   ondansetron (ZOFRAN) injection 4 mg  4 mg Intravenous Q6H PRN Yates, Jennifer, MD       oxyCODONE (Oxy IR/ROXICODONE) immediate release tablet 5 mg  5 mg Oral Q4H PRN Yates, Jennifer, MD   5 mg at 05/09/23 1641   pantoprazole (PROTONIX) EC tablet 40 mg  40 mg Oral BID AC Yates, Jennifer, MD   40 mg at 05/09/23 1640   polyethylene glycol (MIRALAX / GLYCOLAX) packet 17 g  17 g Oral BID Yates, Jennifer, MD       polyethylene glycol (MIRALAX / GLYCOLAX) packet 17 g  17 g Oral Daily PRN Yates, Jennifer, MD       senna-docusate (Senokot-S) tablet 1 tablet  1 tablet Oral BID Yates, Jennifer, MD       thiamine (VITAMIN B1) tablet 100 mg  100 mg Oral Daily Yates, Jennifer, MD   100 mg at 05/09/23 1343   Or   thiamine (VITAMIN B1) injection 100   mg  100 mg Intravenous Daily Yates, Jennifer, MD       traZODone (DESYREL) tablet 75 mg  75 mg Oral QHS Yates, Jennifer, MD       [START ON 05/10/2023] vancomycin (VANCOREADY) IVPB 1250 mg/250 mL  1,250 mg Intravenous Q24H Wilson, Heather W, RPH       vancomycin (VANCOREADY) IVPB 1750 mg/350 mL  1,750 mg Intravenous Once Wilson, Heather W, RPH 175 mL/hr at 05/09/23 1600 1,750 mg at 05/09/23 1600    PHYSICAL EXAM Vitals:   05/09/23 1130 05/09/23 1200 05/09/23 1427 05/09/23 1627  BP: (!) 143/78  (!) 175/75 124/78 (!) 149/82  Pulse: 69 73 84 91  Resp:   18 20  Temp:   98.1 F (36.7 C) 97.9 F (36.6 C)  TempSrc:   Oral Oral  SpO2: 98% 99% 100% 98%  Weight:  81.6 kg     Well-appearing man in no acute distress Regular rate and rhythm Unlabored breathing No palpable right popliteal pulse No palpable right pedal pulses Gangrene of the right second and third toe   PERTINENT LABORATORY AND RADIOLOGIC DATA  Most recent CBC    Latest Ref Rng & Units 05/09/2023   10:14 AM 04/29/2023    2:40 AM 04/28/2023    5:11 AM  CBC  WBC 4.0 - 10.5 K/uL 9.6  8.5  8.1   Hemoglobin 13.0 - 17.0 g/dL 14.2  12.5  12.7   Hematocrit 39.0 - 52.0 % 38.7  35.0  34.9   Platelets 150 - 400 K/uL 276  170  185      Most recent CMP    Latest Ref Rng & Units 05/09/2023   10:14 AM 04/29/2023    2:40 AM 04/28/2023    5:11 AM  CMP  Glucose 70 - 99 mg/dL 385  184  153   BUN 8 - 23 mg/dL 26  29  27   Creatinine 0.61 - 1.24 mg/dL 1.73  1.82  1.71   Sodium 135 - 145 mmol/L 121  131  129   Potassium 3.5 - 5.1 mmol/L 4.9  4.5  4.7   Chloride 98 - 111 mmol/L 91  101  99   CO2 22 - 32 mmol/L 22  21  19   Calcium 8.9 - 10.3 mg/dL 9.6  9.3  9.3   Total Protein 6.5 - 8.1 g/dL 7.1  6.2  5.9   Total Bilirubin 0.3 - 1.2 mg/dL 0.7  0.6  1.0   Alkaline Phos 38 - 126 U/L 89  62  60   AST 15 - 41 U/L 28  24  35   ALT 0 - 44 U/L 22  26  25     Renal function Estimated Creatinine Clearance: 45.9 mL/min (A) (by C-G formula based on SCr of 1.73 mg/dL (H)).  Hgb A1c MFr Bld (%)  Date Value  04/24/2023 7.7 (H)    LDL Chol Calc (NIH)  Date Value Ref Range Status  12/14/2020 73 0 - 99 mg/dL Final   LDL Cholesterol  Date Value Ref Range Status  04/28/2023 38 0 - 99 mg/dL Final    Comment:           Total Cholesterol/HDL:CHD Risk Coronary Heart Disease Risk Table                     Men   Women  1/2 Average Risk   3.4   3.3  Average Risk         5.0   4.4  2 X Average Risk   9.6   7.1  3 X Average Risk  23.4    11.0        Use the calculated Patient Ratio above and the CHD Risk Table to determine the patient's CHD Risk.        ATP III CLASSIFICATION (LDL):  <100     mg/dL   Optimal  100-129  mg/dL   Near or Above                    Optimal  130-159  mg/dL   Borderline  160-189  mg/dL   High  >190     mg/dL   Very High Performed at [facility], [address] [place], [place] [address]     Marah Park N. Anjana Cheek, MD FACS [facility] [place] Office Phone Number: [phone] 05/09/2023 4:51 PM   Total time spent on preparing this encounter including chart review, data review, collecting history, examining the patient, coordinating care for this established patient, 40 minutes  Portions of this report may have been transcribed using voice recognition software.  Every effort has been made to ensure accuracy; however, inadvertent computerized transcription errors may still be present.    

## 2023-05-10 DIAGNOSIS — I739 Peripheral vascular disease, unspecified: Secondary | ICD-10-CM | POA: Diagnosis not present

## 2023-05-10 LAB — GLUCOSE, CAPILLARY
Glucose-Capillary: 149 mg/dL — ABNORMAL HIGH (ref 70–99)
Glucose-Capillary: 178 mg/dL — ABNORMAL HIGH (ref 70–99)
Glucose-Capillary: 69 mg/dL — ABNORMAL LOW (ref 70–99)

## 2023-05-10 LAB — BASIC METABOLIC PANEL
Anion gap: 9 (ref 5–15)
BUN: 23 mg/dL (ref 8–23)
CO2: 18 mmol/L — ABNORMAL LOW (ref 22–32)
Calcium: 8.8 mg/dL — ABNORMAL LOW (ref 8.9–10.3)
Chloride: 99 mmol/L (ref 98–111)
Creatinine, Ser: 1.6 mg/dL — ABNORMAL HIGH (ref 0.61–1.24)
GFR, Estimated: 48 mL/min — ABNORMAL LOW (ref >60)
Glucose, Bld: 217 mg/dL — ABNORMAL HIGH (ref 70–99)
Potassium: 3.9 mmol/L (ref 3.5–5.1)
Sodium: 126 mmol/L — ABNORMAL LOW (ref 135–145)

## 2023-05-10 LAB — CBC
HCT: 31.1 % — ABNORMAL LOW (ref 39.0–52.0)
Hemoglobin: 11.4 g/dL — ABNORMAL LOW (ref 13.0–17.0)
MCH: 27.6 pg (ref 26.0–34.0)
MCHC: 36.7 g/dL — ABNORMAL HIGH (ref 30.0–36.0)
MCV: 75.3 fL — ABNORMAL LOW (ref 80.0–100.0)
Platelets: 225 10*3/uL (ref 150–400)
RBC: 4.13 MIL/uL — ABNORMAL LOW (ref 4.22–5.81)
RDW: 13 % (ref 11.5–15.5)
WBC: 7.9 10*3/uL (ref 4.0–10.5)
nRBC: 0 % (ref 0.0–0.2)

## 2023-05-10 LAB — CULTURE, BLOOD (ROUTINE X 2): Special Requests: ADEQUATE

## 2023-05-10 MED ORDER — OXYCODONE-ACETAMINOPHEN 5-325 MG PO TABS
1.0000 | ORAL_TABLET | ORAL | Status: DC | PRN
  Administered 2023-05-10: 2 via ORAL
  Administered 2023-05-10: 1 via ORAL
  Administered 2023-05-10 – 2023-05-13 (×8): 2 via ORAL
  Filled 2023-05-10 (×8): qty 2
  Filled 2023-05-10: qty 1
  Filled 2023-05-10: qty 2

## 2023-05-10 MED ORDER — INSULIN GLARGINE-YFGN 100 UNIT/ML ~~LOC~~ SOLN
25.0000 [IU] | Freq: Every day | SUBCUTANEOUS | Status: DC
  Administered 2023-05-10 – 2023-05-13 (×3): 25 [IU] via SUBCUTANEOUS
  Filled 2023-05-10 (×4): qty 0.25

## 2023-05-10 MED ORDER — HYDROMORPHONE HCL 1 MG/ML IJ SOLN
1.0000 mg | INTRAMUSCULAR | Status: DC | PRN
  Administered 2023-05-10 – 2023-05-14 (×15): 1 mg via INTRAVENOUS
  Filled 2023-05-10 (×15): qty 1

## 2023-05-10 MED ORDER — HYDROMORPHONE HCL 1 MG/ML IJ SOLN
0.5000 mg | INTRAMUSCULAR | Status: DC | PRN
  Administered 2023-05-10: 0.5 mg via INTRAVENOUS
  Filled 2023-05-10: qty 0.5

## 2023-05-10 NOTE — Progress Notes (Addendum)
Initial Nutrition Assessment  DOCUMENTATION CODES:   Not applicable  INTERVENTION:  - Add Glucerna Shake po BID, each supplement provides 220 kcal and 10 grams of protein  NUTRITION DIAGNOSIS:   Increased nutrient needs related to post-op healing as evidenced by estimated needs.  GOAL:   Patient will meet greater than or equal to 90% of their needs  MONITOR:   PO intake  REASON FOR ASSESSMENT:   Consult Wound healing  ASSESSMENT:   62 y.o. male admits related to necrotic foot. PMH includes: polysubstance abuse, PVD, CAD s/p stents, stage 3 CKD, COPD, DM, HTN, HLD. Pt is currently receiving medical management related to gangrene due to PVD.  Meds reviewed: lipitor, colace, folic acid, sliding scale insulin, semglee, mag-ox, MVI, miralax, senokot, thiamine. Labs reviewed:  Na low, creatinine elevated.   The pt reports that he has a great appetite and has been eating well since admission. No wt loss per record. Pt is meeting his needs at this time. Needs increased related to surgical intervention planned for tomorrow. RD will add Glucerna BID. Will continue to monitor PO intakes.   NUTRITION - FOCUSED PHYSICAL EXAM:  WDL - no wasting noted.   Diet Order:   Diet Order             Diet NPO time specified  Diet effective midnight           Diet heart healthy/carb modified Room service appropriate? Yes; Fluid consistency: Thin  Diet effective now                   EDUCATION NEEDS:   Not appropriate for education at this time  Skin:  Skin Assessment: Reviewed RN Assessment  Last BM:  05/08/23  Height:   Ht Readings from Last 1 Encounters:  04/24/23 5\' 11"  (1.803 m)    Weight:   Wt Readings from Last 1 Encounters:  05/09/23 81.6 kg    Ideal Body Weight:     BMI:  Body mass index is 25.09 kg/m.  Estimated Nutritional Needs:   Kcal:  2040-2500 kcals  Protein:  100-125 gm  Fluid:  >/= 2 L  Bethann Humble, RD, LDN, CNSC.

## 2023-05-10 NOTE — Plan of Care (Signed)
  Problem: Activity: Goal: Ability to return to baseline activity level will improve Outcome: Progressing   Problem: Health Behavior/Discharge Planning: Goal: Ability to manage health-related needs will improve Outcome: Progressing   Problem: Pain Managment: Goal: General experience of comfort will improve Outcome: Progressing

## 2023-05-10 NOTE — Hospital Course (Signed)
62yo male with h/o remote polysubstance abuse (ETOH, cocaine), PVD, CAD s/p stents, stage 3 CKD, COPD, DM, HTN, and HLD who presented on 6/15 for necrotic foot.  He was recently admitted from 5/31-6/6 for dry gangrene and underwent lithotripsy of the R popliteal artery and angioplasty, plan to follow up with vascular surgery in a month to allow necrotic foot to demarcate. During that visit he was also noted to have an incidental pancreatic tail mass that was suspicion for pancreatic CA and was recommended to have outpatient MRCP. Pain and discoloration were worsening so he returned to the ER. Evaluation appears to show progressive necrosis despite apparently intact circulation.  Vascular surgery is consulting and ordered ABIs (B noncompressible LE arteries with abnormal B toe-brachial indices) and RLE arterial duplex (calcified but patent vessels throughout with flow noted).  R TMA planned for 6/18.

## 2023-05-10 NOTE — Progress Notes (Signed)
Progress Note   Patient: Jose [name] ZOX:[id number] DOB: 08/21/1961 DOA: 05/09/2023     1 DOS: the patient was seen and examined on 05/10/2023   Brief hospital course: 62yo male with h/o remote polysubstance abuse (ETOH, cocaine), PVD, CAD s/p stents, stage 3 CKD, COPD, DM, HTN, and HLD who presented on 6/15 for necrotic foot.  He was recently admitted from 5/31-6/6 for dry gangrene and underwent lithotripsy of the L popliteal artery and angioplasty, plan to follow up with vascular surgery in a month to allow necrotic foot to demarcate. During that visit he was also noted to have an incidental pancreatic tail mass that was suspicion for pancreatic CA and was recommended to have outpatient MRCP. Pain and discoloration were worsening so he returned to the ER. Evaluation appears to show progressive necrosis despite apparently intact circulation.  Vascular surgery is consulting and ordered ABIs (B noncompressible LE arteries with abnormal B toe-brachial indices) and RLE arterial duplex (calcified but patent vessels throughout with flow noted).  Assessment and Plan:  Gangrene of R foot/PVD -Recent hospitalization for the same, now with worsening symptoms -During his last hospitalization he underwent successful revascularization -Now with progressive necrosis, surrounding erythema with possible cellulitis -He appears to need amputation, likely TMA - vascular is arranging but he is at risk of poor healing and is aware -Normal lactate, CRP, ESR, prealbumin; blood cultures negative x 24 hours -Will treat with IV antibiotics (Rocephin/Flagyl/Vanc as per the lower extremity wound algorithm) -Vascular surgery will consult -LE wound order set utilized including consults (diabetes coordinator; peripheral vascular navigator; TOC team; and nutrition)  -Continue ASA, will hold Plavix for now unless vascular says it is ok to continue -Continue gabapentin -Continue oxy, change morphine to Dilaudid for  improved pain control   Incidental finding of Pancreatic Tail Mass -Noted during last hospitalization, concerning for pancreatic cancer. -CA19-9 was 13 -[facility].   -He had a telephone visit on 6/11 and appears to have been referred for MRCP, although this does not appear to have been done yet -Consider Trouseau syndrome for his ischemic toes (migratory thrombophlebitis associated with undiagnosed malignancy), but appears to be more likely related to chronic uncontrolled DM and HTN -Consider inpatient MRCP since depending on his clinical course it may be some time until he is able to effectively follow up as an outpatient for further treatment   Stage 3b CKD -Appears to be stable at this time -Attempt to avoid nephrotoxic medications -Recheck BMP in AM    Acute on Chronic Hyponatremia -Baseline appears to be 128-130, improving -Suspect beer potomania with probable volume deficiency in the setting of necrotic foot -Will continue to hydrate and follow   DM -Recent A1c was 7.7 -hold alogliptin -Continue Lantus -Cover with sensitive-scale SSI    HTN, refractory -Continue amlodipine, carvedilol, hydralazine   HLD -Continue atorvastatin -Hold fish oil due to limited inpatient utility   COPD -Appears to be compensated -Continue Advair (Dulera per formulary), Albuterol -Hold Duonebs (uses prn)   Constipation -Continue Miralax and Senokot S BID   CAD -No acute concerns -Continue ASA 81 and hold Plavix for now   Glaucoma -Continue Brimonidine, brinzolamide, and Latanoprost     Subjective: Pain remains uncontrolled.  Patient reports plan for TMA in the next day or two.  Physical Exam: Vitals:   05/09/23 1627 05/09/23 1938 05/10/23 0001 05/10/23 0352  BP: (!) 149/82 130/75 117/61 125/72  Pulse: 91 81 61 60  Resp:  20 18 16 17   Temp: 97.9 F (36.6 C) 98.4 F (36.9 C) 98.3 F (36.8 C)   TempSrc: Oral Oral Oral    SpO2: 98% 98% 97% 96%  Weight:       General:  Appears calm and comfortable and is in NAD Eyes:  EOMI, normal lids, iris ENT:  grossly normal hearing, lips & tongue, mmm Neck:  no LAD, masses or thyromegaly Cardiovascular:  RRR, no m/r/g. No LE edema.  Respiratory:   CTA bilaterally with no wheezes/rales/rhonchi.  Normal respiratory effort. Abdomen:  soft, NT, ND Back:   normal alignment, no CVAT Skin:  erythema surrounding necrotic toes may be mildly improved, otherwise generally unchanged Musculoskeletal:  grossly normal tone BUE/BLE, good ROM, no bony abnormality Psychiatric:  blunted mood and affect, speech fluent and appropriate, AOx3 Neurologic:  CN 2-12 grossly intact, moves all extremities in coordinated fashion   Radiological Exams on Admission: Independently reviewed - see discussion in A/P where applicable  VAS [place] LOWER EXTREMITY ARTERIAL DUPLEX  Result Date: 05/09/2023 LOWER EXTREMITY ARTERIAL DUPLEX STUDY Patient Name:  Jose [name] Jose [name]  Date of Exam:   05/09/2023 Medical Rec #: [id number]           Accession #:    [id number] Date of Birth: 1961-03-27           Patient Gender: M Patient Age:   62 years Exam Location:  [facility] Procedure:      VAS [place] LOWER EXTREMITY ARTERIAL DUPLEX Referring Phys: Heath Lark --------------------------------------------------------------------------------  Indications: Ulceration, and gangrene. High Risk Factors: Hypertension, hyperlipidemia, Diabetes.  Vascular Interventions: Angioplasty to right popliteal done 04/27/2023. Current ABI:            non compressible bilaterally with absent toe waveforms Comparison Study: No prior study Performing Technologist: Sherren Kerns RVS  Examination Guidelines: A complete evaluation includes B-mode imaging, spectral Doppler, color Doppler, and power Doppler as needed of all accessible portions of each vessel. Bilateral testing is considered an integral part of a complete examination. Limited  examinations for reoccurring indications may be performed as noted.  +----------+--------+-----+--------+-----------+----------------+ RIGHT     PSV cm/sRatioStenosisWaveform   Comments         +----------+--------+-----+--------+-----------+----------------+ CFA Prox  143                  multiphasic                 +----------+--------+-----+--------+-----------+----------------+ DFA       71                   multiphasic                 +----------+--------+-----+--------+-----------+----------------+ SFA Prox  82                   multiphasic                 +----------+--------+-----+--------+-----------+----------------+ SFA Mid   80                   multiphasic                 +----------+--------+-----+--------+-----------+----------------+ SFA Distal85                   multiphasic                 +----------+--------+-----+--------+-----------+----------------+ POP Prox  45  multiphasic                 +----------+--------+-----+--------+-----------+----------------+ POP Distal61                   multiphasic                 +----------+--------+-----+--------+-----------+----------------+ ATA Prox  26                   monophasic                  +----------+--------+-----+--------+-----------+----------------+ ATA Mid   73                   monophasic                  +----------+--------+-----+--------+-----------+----------------+ ATA Distal28                   monophasic                  +----------+--------+-----+--------+-----------+----------------+ PTA Prox  65                   monophasic                  +----------+--------+-----+--------+-----------+----------------+ PTA Mid   56                   monophasic                  +----------+--------+-----+--------+-----------+----------------+ PTA Distal31                   monophasic collateral noted  +----------+--------+-----+--------+-----------+----------------+   Summary: Right: Calcified but patent vessels noted throughout with multiphasic waveforms in the CFA, DFA, SFA, and Popliteal arteries and monophasic flow in the posterior tibial and anterior tibial arteries.  See table(s) above for measurements and observations.    Preliminary    VAS [place] ABI WITH/WO TBI  Result Date: 05/09/2023  LOWER EXTREMITY DOPPLER STUDY Patient Name:  Jose [name] [name]  Date of Exam:   05/09/2023 Medical Rec #: [id number]           Accession #:    [id number] Date of Birth: [birth year]           Patient Gender: M Patient Age:   19 years Exam Location:  [facility] Procedure:      VAS [place] ABI WITH/WO TBI Referring Phys: Heath Lark --------------------------------------------------------------------------------  Indications: Ulceration, and gangrene. High Risk Factors: Hypertension, hyperlipidemia, Diabetes.  Vascular Interventions: Status post angioplasty 04/27/23 to right popliteal                         artery. Comparison Study: Prior ABI done 04/24/2023 Performing Technologist: Sherren Kerns RVS  Examination Guidelines: A complete evaluation includes at minimum, Doppler waveform signals and systolic blood pressure reading at the level of bilateral brachial, anterior tibial, and posterior tibial arteries, when vessel segments are accessible. Bilateral testing is considered an integral part of a complete examination. Photoelectric Plethysmograph (PPG) waveforms and toe systolic pressure readings are included as required and additional duplex testing as needed. Limited examinations for reoccurring indications may be performed as noted.  ABI Findings: +---------+------------------+-----+-----------+--------+ Right    Rt Pressure (mmHg)IndexWaveform   Comment  +---------+------------------+-----+-----------+--------+ Brachial 156                    multiphasic          +---------+------------------+-----+-----------+--------+  ATA      254               1.52 multiphasic         +---------+------------------+-----+-----------+--------+ PTA      254               1.52 multiphasic         +---------+------------------+-----+-----------+--------+ Great Toe0                 0.00                     +---------+------------------+-----+-----------+--------+ +---------+------------------+-----+-----------+-------+ Left     Lt Pressure (mmHg)IndexWaveform   Comment +---------+------------------+-----+-----------+-------+ Brachial 167                    triphasic          +---------+------------------+-----+-----------+-------+ ATA      254               1.52 multiphasic        +---------+------------------+-----+-----------+-------+ PTA      254               1.52 multiphasic        +---------+------------------+-----+-----------+-------+ Great Toe0                 0.00                    +---------+------------------+-----+-----------+-------+ +-------+-----------+-----------+------------+------------+ ABI/TBIToday's ABIToday's TBIPrevious ABIPrevious TBI +-------+-----------+-----------+------------+------------+ Right  1.52       absent     1.28        absent       +-------+-----------+-----------+------------+------------+ Left   1.52       absent     1.28        0.37         +-------+-----------+-----------+------------+------------+  Arterial wall calcification precludes accurate ankle pressures and ABIs. Right and Bilateral ABIs appear increased compared to prior study on 04/24/2023. Right TBIs appear unchanged since prior study done 04/24/2023. Left TBIs appear decreased compared to prior study on 04/24/2023.  Summary: Right: Resting right ankle-brachial index indicates noncompressible right lower extremity arteries. The right toe-brachial index is abnormal. Left: Resting left ankle-brachial index indicates  noncompressible left lower extremity arteries. The left toe-brachial index is abnormal. *See table(s) above for measurements and observations.     Preliminary      Pertinent Labs:  Na++ 126, up from 121 CO2 18 Glucose 217 BUN 23/Creatinine 1.6/GFR 48 - stable Lactate 1.2 CRP 1 ESR 8 Prealbumin 23 WBC 7.9 Hgb 11.4; 14.2 on 6/15 Blood cultures negative x 24 hours   Family Communication: None present; I attempted to call his niece today without success  Disposition: Status is: Inpatient Remains inpatient appropriate because: necrotic foot  Planned Discharge Destination:  to be determined    Time spent: 35 minutes  Author: Jonah Blue, MD 05/10/2023 8:44 AM  For on call review [email or web address].

## 2023-05-10 NOTE — Progress Notes (Signed)
[facility] [place] PROGRESS NOTE  ASSESSMENT / PLAN: Jose [name] is a 62 y.o. 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   Recommend the following which can slow the progression of atherosclerosis and reduce the risk of major adverse cardiac / limb events:  Complete cessation from all tobacco products. Blood glucose control with goal A1c < 7%. Blood pressure control with goal blood pressure < 140/90 mmHg. Lipid reduction therapy with goal LDL-C <100 mg/dL (<16 if symptomatic from PAD).  Aspirin 81mg  PO QD.  Clopidogrel 75mg  PO QD. Atorvastatin 40-80mg  PO QD (or other "high intensity" statin therapy).   Good technical result at angiogram 04/27/23. Non-invasive studies show intervention remains patent. Suspect severe pedal disease is causing deterioration. Offered patient TMA. Will look for time to do this in the coming days as the OR schedule allows.  SUBJECTIVE: No complaints. Reviewed non-invasive studies with patient in detail. Explained options: TMA vs. BKA vs. Watchful waiting. He prefers TMA. Counseled that he has risk of poor healing. He wants to try to save as much of his foot and ankle as possible. I think this is reasonable.  OBJECTIVE: BP 120/67 (BP Location: Right Arm)   Pulse 61   Temp 98.4 F (36.9 C) (Oral)   Resp 16   Wt 81.6 kg   SpO2 99%   BMI 25.09 kg/m   Intake/Output Summary (Last 24 hours) at 05/10/2023 1136 Last data filed at 05/10/2023 1100 Gross per 24 hour  Intake 1121.06 ml  Output 1855 ml  Net -733.94 ml    Well-appearing man in no acute distress Regular rate and rhythm Unlabored breathing No palpable right popliteal pulse No palpable right pedal pulses     Latest Ref Rng & Units 05/10/2023    1:40 AM 05/09/2023   10:14 AM 04/29/2023    2:40 AM  CBC  WBC 4.0 - 10.5 K/uL 7.9  9.6  8.5   Hemoglobin 13.0 - 17.0 g/dL 10.9  60.4  54.0   Hematocrit 39.0 - 52.0 % 31.1  38.7   35.0   Platelets 150 - 400 K/uL 225  276  170         Latest Ref Rng & Units 05/10/2023    1:40 AM 05/09/2023   10:14 AM 04/29/2023    2:40 AM  CMP  Glucose 70 - 99 mg/dL 981  191  478   BUN 8 - 23 mg/dL 23  26  29    Creatinine 0.61 - 1.24 mg/dL 2.95  6.21  3.08   Sodium 135 - 145 mmol/L 126  121  131   Potassium 3.5 - 5.1 mmol/L 3.9  4.9  4.5   Chloride 98 - 111 mmol/L 99  91  101   CO2 22 - 32 mmol/L 18  22  21    Calcium 8.9 - 10.3 mg/dL 8.8  9.6  9.3   Total Protein 6.5 - 8.1 g/dL  7.1  6.2   Total Bilirubin 0.3 - 1.2 mg/dL  0.7  0.6   Alkaline Phos 38 - 126 U/L  89  62   AST 15 - 41 U/L  28  24   ALT 0 - 44 U/L  22  26     Estimated Creatinine Clearance: 49.7 mL/min (A) (by C-G formula based on SCr of 1.6 mg/dL (H)).  Rande Brunt. Lenell Antu, MD [facility] Phone Number: [phone] 05/10/2023 11:36 AM

## 2023-05-11 DIAGNOSIS — E872 Acidosis, unspecified: Secondary | ICD-10-CM | POA: Diagnosis not present

## 2023-05-11 DIAGNOSIS — I739 Peripheral vascular disease, unspecified: Secondary | ICD-10-CM | POA: Diagnosis not present

## 2023-05-11 HISTORY — DX: Acidosis, unspecified: E87.20

## 2023-05-11 LAB — BASIC METABOLIC PANEL
Anion gap: 15 (ref 5–15)
BUN: 24 mg/dL — ABNORMAL HIGH (ref 8–23)
CO2: 14 mmol/L — ABNORMAL LOW (ref 22–32)
Calcium: 9.1 mg/dL (ref 8.9–10.3)
Chloride: 105 mmol/L (ref 98–111)
Creatinine, Ser: 1.64 mg/dL — ABNORMAL HIGH (ref 0.61–1.24)
GFR, Estimated: 46 mL/min — ABNORMAL LOW (ref >60)
Glucose, Bld: 182 mg/dL — ABNORMAL HIGH (ref 70–99)
Potassium: 5.5 mmol/L — ABNORMAL HIGH (ref 3.5–5.1)
Sodium: 134 mmol/L — ABNORMAL LOW (ref 135–145)

## 2023-05-11 LAB — CBC WITH DIFFERENTIAL/PLATELET
Abs Immature Granulocytes: 0.15 10*3/uL — ABNORMAL HIGH (ref 0.00–0.07)
Basophils Absolute: 0.1 10*3/uL (ref 0.0–0.1)
Basophils Relative: 1 %
Eosinophils Absolute: 0.4 10*3/uL (ref 0.0–0.5)
Eosinophils Relative: 5 %
HCT: 32.1 % — ABNORMAL LOW (ref 39.0–52.0)
Hemoglobin: 11.9 g/dL — ABNORMAL LOW (ref 13.0–17.0)
Immature Granulocytes: 2 %
Lymphocytes Relative: 31 %
Lymphs Abs: 2.4 10*3/uL (ref 0.7–4.0)
MCH: 28.8 pg (ref 26.0–34.0)
MCHC: 37.1 g/dL — ABNORMAL HIGH (ref 30.0–36.0)
MCV: 77.7 fL — ABNORMAL LOW (ref 80.0–100.0)
Monocytes Absolute: 0.7 10*3/uL (ref 0.1–1.0)
Monocytes Relative: 9 %
Neutro Abs: 4.2 10*3/uL (ref 1.7–7.7)
Neutrophils Relative %: 52 %
Platelets: UNDETERMINED 10*3/uL (ref 150–400)
RBC: 4.13 MIL/uL — ABNORMAL LOW (ref 4.22–5.81)
RDW: 13.2 % (ref 11.5–15.5)
WBC: 7.9 10*3/uL (ref 4.0–10.5)
nRBC: 0 % (ref 0.0–0.2)

## 2023-05-11 LAB — CULTURE, BLOOD (ROUTINE X 2): Culture: NO GROWTH

## 2023-05-11 LAB — GLUCOSE, CAPILLARY
Glucose-Capillary: 204 mg/dL — ABNORMAL HIGH (ref 70–99)
Glucose-Capillary: 135 mg/dL — ABNORMAL HIGH (ref 70–99)
Glucose-Capillary: 99 mg/dL (ref 70–99)
Glucose-Capillary: 157 mg/dL — ABNORMAL HIGH (ref 70–99)

## 2023-05-11 LAB — MRSA NEXT GEN BY PCR, NASAL: MRSA by PCR Next Gen: NOT DETECTED

## 2023-05-11 LAB — VAS US ABI WITH/WO TBI: Left ABI: 1.52

## 2023-05-11 MED ORDER — GLUCERNA SHAKE PO LIQD
237.0000 mL | Freq: Two times a day (BID) | ORAL | Status: DC
  Administered 2023-05-13 – 2023-05-17 (×9): 237 mL via ORAL

## 2023-05-11 MED ORDER — AMITRIPTYLINE HCL 25 MG PO TABS
25.0000 mg | ORAL_TABLET | Freq: Every day | ORAL | Status: DC
  Administered 2023-05-11 – 2023-05-17 (×7): 25 mg via ORAL
  Filled 2023-05-11 (×7): qty 1

## 2023-05-11 NOTE — Inpatient Diabetes Management (Signed)
Inpatient Diabetes Program Recommendations  AACE/ADA: New Consensus Statement on Inpatient Glycemic Control (2015)  Target Ranges:  Prepandial:   less than 140 mg/dL      Peak postprandial:   less than 180 mg/dL (1-2 hours)      Critically ill patients:  140 - 180 mg/dL   Lab Results  Component Value Date   GLUCAP 204 (H) 05/11/2023   HGBA1C 7.7 (H) 04/24/2023    Review of Glycemic Control  Latest Reference Range & Units 05/10/23 07:17 05/10/23 11:35 05/10/23 16:21 05/11/23 08:27  Glucose-Capillary 70 - 99 mg/dL 811 (H) 914 (H) 69 (L) 204 (H)  (H): Data is abnormally high (L): Data is abnormally low Diabetes history: Type 2 DM Outpatient Diabetes medications: Lantus 25 units QD Current orders for Inpatient glycemic control: Semglee 25 units every day, Novolog 0-9 units TID & HS  Inpatient Diabetes Program Recommendations:    Noted mild hypoglycemia of 69 mg/dL following Novolog 2 units correction.   Consider: -Decreasing Semglee to 22 units every day -Decreasing correction to Novolog 0-6 units TID   Thanks, Lujean Rave, MSN, RNC-OB Diabetes Coordinator [phone] (8a-5p)

## 2023-05-11 NOTE — Progress Notes (Signed)
Progress Note   Patient: Jose [name] ZOX:[id number] DOB: 1961-05-10 DOA: 05/09/2023     2 DOS: the patient was seen and examined on 05/11/2023   Brief hospital course: 62yo male with h/o remote polysubstance abuse (ETOH, cocaine), PVD, CAD s/p stents, stage 3 CKD, COPD, DM, HTN, and HLD who presented on 6/15 for necrotic foot.  He was recently admitted from 5/31-6/6 for dry gangrene and underwent lithotripsy of the L popliteal artery and angioplasty, plan to follow up with vascular surgery in a month to allow necrotic foot to demarcate. During that visit he was also noted to have an incidental pancreatic tail mass that was suspicion for pancreatic CA and was recommended to have outpatient MRCP. Pain and discoloration were worsening so he returned to the ER. Evaluation appears to show progressive necrosis despite apparently intact circulation.  Vascular surgery is consulting and ordered ABIs (B noncompressible LE arteries with abnormal B toe-brachial indices) and RLE arterial duplex (calcified but patent vessels throughout with flow noted).  Assessment and Plan:  Gangrene of R foot/PVD -Recent hospitalization for the same, now with worsening symptoms -During his last hospitalization he underwent successful revascularization -Now with progressive necrosis, surrounding erythema with possible cellulitis -He appears to need amputation, likely TMA - vascular is arranging but he is at risk of poor healing and is aware -Normal lactate, CRP, ESR, prealbumin; blood cultures negative x 2 days -Will treat with IV antibiotics (Rocephin/Flagyl/Vanc as per the lower extremity wound algorithm) -Vascular surgery consulting -LE wound order set utilized including consults (diabetes coordinator; peripheral vascular navigator; TOC team; and nutrition)  -Continue ASA, will hold Plavix for now unless vascular says it is ok to continue -Continue gabapentin -Continue oxy, changed morphine to Dilaudid for improved  pain control - this is significantly improved now -Planned for TMA on 6/18   Incidental finding of Pancreatic Tail Mass -Noted during last hospitalization, concerning for pancreatic cancer. -CA19-9 was 13 -[facility].   -He had a telephone visit on 6/11 and appears to have been referred for MRCP, although this does not appear to have been done yet -Consider Trouseau syndrome for his ischemic toes (migratory thrombophlebitis associated with undiagnosed malignancy), but appears to be more likely related to chronic uncontrolled DM and HTN -Consider inpatient MRCP since depending on his clinical course it may be some time until he is able to effectively follow up as an outpatient for further treatment   Stage 3b CKD -Appears to be stable at this time -Attempt to avoid nephrotoxic medications -Recheck BMP in AM    Acute on Chronic Hyponatremia -Baseline appears to be 128-130, improving -Suspect beer potomania with probable volume deficiency in the setting of necrotic foot -Will continue to hydrate and follow   DM -Recent A1c was 7.7 -hold alogliptin -Continue Lantus -Cover with sensitive-scale SSI   Hyperkalemia/Metabolic acidosis -Not receiving iatrogenic K+ -Worsening acidosis in the setting of foot necrosis, no anion gap currently -Will hold Mag++ supplementation for now -Repeat BMP in AM with Mag++   HTN, refractory -Continue amlodipine, carvedilol, hydralazine   HLD -Continue atorvastatin -Hold fish oil due to limited inpatient utility   COPD -Appears to be compensated -Continue Advair (Dulera per formulary), Albuterol -Hold Duonebs (uses prn)   Constipation -Continue Miralax and Senokot S BID   CAD -No acute concerns -Continue ASA 81 and hold Plavix for now   Glaucoma -Continue Brimonidine, brinzolamide, and Latanoprost   Insomnia -Reports that he finally slept  for the first time last night since his pain  was controlled -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  Pain is better controlled.  He does think that the swelling is moving to his posterior foot.  Ready for surgery when able.    Physical Exam: Vitals:   05/10/23 2014 05/11/23 0415 05/11/23 0816 05/11/23 0826  BP: (!) 116/58 (!) 147/72  126/66  Pulse: 61 (!) 54  62  Resp: 17 17    Temp: 98.1 F (36.7 C)   98.3 F (36.8 C)  TempSrc: Oral   Oral  SpO2: 98% 98% 97% 100%  Weight:       General:  Appears calm and comfortable and is in NAD Eyes:  EOMI, normal lids, iris ENT:  grossly normal hearing, lips & tongue, mmm Neck:  no LAD, masses or thyromegaly Cardiovascular:  RRR, no m/r/g. No LE edema.  Respiratory:   CTA bilaterally with no wheezes/rales/rhonchi.  Normal respiratory effort. Abdomen:  soft, NT, ND Back:   normal alignment, no CVAT Skin:  erythema surrounding necrotic toes may be mildly improved, otherwise generally unchanged Musculoskeletal:  grossly normal tone BUE/BLE, good ROM, no bony abnormality Psychiatric:  blunted mood and affect, speech fluent and appropriate, AOx3 Neurologic:  CN 2-12 grossly intact, moves all extremities in coordinated fashion   Radiological Exams on Admission: Independently reviewed - see discussion in A/P where applicable  VAS [place] LOWER EXTREMITY ARTERIAL DUPLEX  Result Date: 05/09/2023 LOWER EXTREMITY ARTERIAL DUPLEX STUDY Patient Name:  Jose [name] Aydt  Date of Exam:   05/09/2023 Medical Rec #: [id number]           Accession #:    [id number] Date of Birth: November 08, 1961           Patient Gender: M Patient Age:   62 years Exam Location:  [facility] Procedure:      VAS [place] LOWER EXTREMITY ARTERIAL DUPLEX Referring Phys: Heath Lark  --------------------------------------------------------------------------------  Indications: Ulceration, and gangrene. High Risk Factors: Hypertension, hyperlipidemia, Diabetes.  Vascular Interventions: Angioplasty to right popliteal done 04/27/2023. Current ABI:            non compressible bilaterally with absent toe waveforms Comparison Study: No prior study Performing Technologist: Sherren Kerns RVS  Examination Guidelines: A complete evaluation includes B-mode imaging, spectral Doppler, color Doppler, and power Doppler as needed of all accessible portions of each vessel. Bilateral testing is considered an integral part of a complete examination. Limited examinations for reoccurring indications may be performed as noted.  +----------+--------+-----+--------+-----------+----------------+ RIGHT     PSV cm/sRatioStenosisWaveform   Comments         +----------+--------+-----+--------+-----------+----------------+ CFA Prox  143                  multiphasic                 +----------+--------+-----+--------+-----------+----------------+ DFA       71                   multiphasic                 +----------+--------+-----+--------+-----------+----------------+ SFA Prox  82                   multiphasic                 +----------+--------+-----+--------+-----------+----------------+  SFA Mid   80                   multiphasic                 +----------+--------+-----+--------+-----------+----------------+ SFA Distal85                   multiphasic                 +----------+--------+-----+--------+-----------+----------------+ POP Prox  45                   multiphasic                 +----------+--------+-----+--------+-----------+----------------+ POP Distal61                   multiphasic                 +----------+--------+-----+--------+-----------+----------------+ ATA Prox  26                   monophasic                   +----------+--------+-----+--------+-----------+----------------+ ATA Mid   73                   monophasic                  +----------+--------+-----+--------+-----------+----------------+ ATA Distal28                   monophasic                  +----------+--------+-----+--------+-----------+----------------+ PTA Prox  65                   monophasic                  +----------+--------+-----+--------+-----------+----------------+ PTA Mid   56                   monophasic                  +----------+--------+-----+--------+-----------+----------------+ PTA Distal31                   monophasic collateral noted +----------+--------+-----+--------+-----------+----------------+   Summary: Right: Calcified but patent vessels noted throughout with multiphasic waveforms in the CFA, DFA, SFA, and Popliteal arteries and monophasic flow in the posterior tibial and anterior tibial arteries.  See table(s) above for measurements and observations.    Preliminary    VAS [place] ABI WITH/WO TBI  Result Date: 05/09/2023  LOWER EXTREMITY DOPPLER STUDY Patient Name:  Jari Favre Junior [name]  Date of Exam:   05/09/2023 Medical Rec #: [id number]           Accession #:    [id number] Date of Birth: 1961/05/24           Patient Gender: M Patient Age:   52 years Exam Location:  [facility] Procedure:      VAS [place] ABI WITH/WO TBI Referring Phys: Heath Lark --------------------------------------------------------------------------------  Indications: Ulceration, and gangrene. High Risk Factors: Hypertension, hyperlipidemia, Diabetes.  Vascular Interventions: Status post angioplasty 04/27/23 to right popliteal                         artery. Comparison Study: Prior ABI done 04/24/2023 Performing Technologist: Sherren Kerns RVS  Examination Guidelines: A complete evaluation includes at minimum, Doppler waveform signals and systolic blood pressure reading  at the level of bilateral brachial, anterior  tibial, and posterior tibial arteries, when vessel segments are accessible. Bilateral testing is considered an integral part of a complete examination. Photoelectric Plethysmograph (PPG) waveforms and toe systolic pressure readings are included as required and additional duplex testing as needed. Limited examinations for reoccurring indications may be performed as noted.  ABI Findings: +---------+------------------+-----+-----------+--------+ Right    Rt Pressure (mmHg)IndexWaveform   Comment  +---------+------------------+-----+-----------+--------+ Brachial 156                    multiphasic         +---------+------------------+-----+-----------+--------+ ATA      254               1.52 multiphasic         +---------+------------------+-----+-----------+--------+ PTA      254               1.52 multiphasic         +---------+------------------+-----+-----------+--------+ Great Toe0                 0.00                     +---------+------------------+-----+-----------+--------+ +---------+------------------+-----+-----------+-------+ Left     Lt Pressure (mmHg)IndexWaveform   Comment +---------+------------------+-----+-----------+-------+ Brachial 167                    triphasic          +---------+------------------+-----+-----------+-------+ ATA      254               1.52 multiphasic        +---------+------------------+-----+-----------+-------+ PTA      254               1.52 multiphasic        +---------+------------------+-----+-----------+-------+ Great Toe0                 0.00                    +---------+------------------+-----+-----------+-------+ +-------+-----------+-----------+------------+------------+ ABI/TBIToday's ABIToday's TBIPrevious ABIPrevious TBI +-------+-----------+-----------+------------+------------+ Right  1.52       absent     1.28        absent        +-------+-----------+-----------+------------+------------+ Left   1.52       absent     1.28        0.37         +-------+-----------+-----------+------------+------------+  Arterial wall calcification precludes accurate ankle pressures and ABIs. Right and Bilateral ABIs appear increased compared to prior study on 04/24/2023. Right TBIs appear unchanged since prior study done 04/24/2023. Left TBIs appear decreased compared to prior study on 04/24/2023.  Summary: Right: Resting right ankle-brachial index indicates noncompressible right lower extremity arteries. The right toe-brachial index is abnormal. Left: Resting left ankle-brachial index indicates noncompressible left lower extremity arteries. The left toe-brachial index is abnormal. *See table(s) above for measurements and observations.     Preliminary      Pertinent labs:    Na++ 134 K+ 5.5 CO2 14 Glucose 182 BUN 24/Creatinine 1.64/GFR 46 - stable WBC 7.9 Hgb 11.9 - stable   Family Communication: None present  Disposition: Status is: Inpatient Remains inpatient appropriate because: planned TMA  Planned Discharge Destination:  to be determined    Time spent: 35 minutes  Author: Jonah Blue, MD 05/11/2023 8:39 AM  For on call review [email or web address].

## 2023-05-11 NOTE — Plan of Care (Signed)
  Problem: Activity: Goal: Risk for activity intolerance will decrease Outcome: Progressing   Problem: Nutrition: Goal: Adequate nutrition will be maintained Outcome: Progressing   Problem: Coping: Goal: Level of anxiety will decrease Outcome: Progressing   

## 2023-05-11 NOTE — Progress Notes (Signed)
[facility] [place] PROGRESS NOTE  ASSESSMENT / PLAN: Jose [name] is a 62 y.o. 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   Recommend the following which can slow the progression of atherosclerosis and reduce the risk of major adverse cardiac / limb events:  Complete cessation from all tobacco products. Blood glucose control with goal A1c < 7%. Blood pressure control with goal blood pressure < 140/90 mmHg. Lipid reduction therapy with goal LDL-C <100 mg/dL (<16 if symptomatic from PAD).  Aspirin 81mg  PO QD.  Clopidogrel 75mg  PO QD. Atorvastatin 40-80mg  PO QD (or other "high intensity" statin therapy).   Plan TMA tomorrow with Dr. Edilia Bo. NPO after midnight. Orders for consent written.  SUBJECTIVE: Reviewed plan for OR tomorrow. No questions.  OBJECTIVE: BP 126/66   Pulse 62   Temp 98.3 F (36.8 C) (Oral)   Resp 17   Wt 81.6 kg   SpO2 100%   BMI 25.09 kg/m   Intake/Output Summary (Last 24 hours) at 05/11/2023 1320 Last data filed at 05/10/2023 1608 Gross per 24 hour  Intake 720 ml  Output 300 ml  Net 420 ml     Well-appearing man in no acute distress Regular rate and rhythm Unlabored breathing No palpable right popliteal pulse No palpable right pedal pulses     Latest Ref Rng & Units 05/11/2023   10:39 AM 05/10/2023    1:40 AM 05/09/2023   10:14 AM  CBC  WBC 4.0 - 10.5 K/uL 7.9  7.9  9.6   Hemoglobin 13.0 - 17.0 g/dL 10.9  60.4  54.0   Hematocrit 39.0 - 52.0 % 32.1  31.1  38.7   Platelets 150 - 400 K/uL PLATELET CLUMPS NOTED ON SMEAR, UNABLE TO ESTIMATE  225  276         Latest Ref Rng & Units 05/11/2023   10:39 AM 05/10/2023    1:40 AM 05/09/2023   10:14 AM  CMP  Glucose 70 - 99 mg/dL 981  191  478   BUN 8 - 23 mg/dL 24  23  26    Creatinine 0.61 - 1.24 mg/dL 2.95  6.21  3.08   Sodium 135 - 145 mmol/L 134  126  121   Potassium 3.5 - 5.1 mmol/L 5.5  3.9  4.9   Chloride 98 - 111  mmol/L 105  99  91   CO2 22 - 32 mmol/L 14  18  22    Calcium 8.9 - 10.3 mg/dL 9.1  8.8  9.6   Total Protein 6.5 - 8.1 g/dL   7.1   Total Bilirubin 0.3 - 1.2 mg/dL   0.7   Alkaline Phos 38 - 126 U/L   89   AST 15 - 41 U/L   28   ALT 0 - 44 U/L   22     Estimated Creatinine Clearance: 48.5 mL/min (A) (by C-G formula based on SCr of 1.64 mg/dL (H)).  Rande Brunt. Lenell Antu, MD [facility] Phone Number: [phone] 05/11/2023 1:20 PM

## 2023-05-12 ENCOUNTER — Inpatient Hospital Stay (HOSPITAL_COMMUNITY): Payer: No Typology Code available for payment source | Admitting: Anesthesiology

## 2023-05-12 ENCOUNTER — Other Ambulatory Visit: Payer: Self-pay

## 2023-05-12 ENCOUNTER — Encounter (HOSPITAL_COMMUNITY): Payer: Self-pay | Admitting: Internal Medicine

## 2023-05-12 ENCOUNTER — Encounter (HOSPITAL_COMMUNITY)
Admission: EM | Disposition: A | Discharge status: Home Health | Payer: Self-pay | Source: Home / Self Care | Attending: Internal Medicine

## 2023-05-12 DIAGNOSIS — Z794 Long term (current) use of insulin: Secondary | ICD-10-CM

## 2023-05-12 DIAGNOSIS — I10 Essential (primary) hypertension: Secondary | ICD-10-CM

## 2023-05-12 DIAGNOSIS — I739 Peripheral vascular disease, unspecified: Secondary | ICD-10-CM | POA: Diagnosis not present

## 2023-05-12 DIAGNOSIS — I96 Gangrene, not elsewhere classified: Secondary | ICD-10-CM | POA: Diagnosis not present

## 2023-05-12 DIAGNOSIS — I252 Old myocardial infarction: Secondary | ICD-10-CM

## 2023-05-12 DIAGNOSIS — Z955 Presence of coronary angioplasty implant and graft: Secondary | ICD-10-CM

## 2023-05-12 DIAGNOSIS — E1152 Type 2 diabetes mellitus with diabetic peripheral angiopathy with gangrene: Secondary | ICD-10-CM

## 2023-05-12 DIAGNOSIS — Z87891 Personal history of nicotine dependence: Secondary | ICD-10-CM

## 2023-05-12 DIAGNOSIS — I25119 Atherosclerotic heart disease of native coronary artery with unspecified angina pectoris: Secondary | ICD-10-CM

## 2023-05-12 DIAGNOSIS — J449 Chronic obstructive pulmonary disease, unspecified: Secondary | ICD-10-CM

## 2023-05-12 HISTORY — PX: TRANSMETATARSAL AMPUTATION: SHX6197

## 2023-05-12 LAB — BASIC METABOLIC PANEL
Anion gap: 8 (ref 5–15)
BUN: 21 mg/dL (ref 8–23)
CO2: 21 mmol/L — ABNORMAL LOW (ref 22–32)
Calcium: 8.9 mg/dL (ref 8.9–10.3)
Chloride: 103 mmol/L (ref 98–111)
Creatinine, Ser: 1.63 mg/dL — ABNORMAL HIGH (ref 0.61–1.24)
GFR, Estimated: 47 mL/min — ABNORMAL LOW (ref >60)
Glucose, Bld: 170 mg/dL — ABNORMAL HIGH (ref 70–99)
Potassium: 4.1 mmol/L (ref 3.5–5.1)
Sodium: 132 mmol/L — ABNORMAL LOW (ref 135–145)

## 2023-05-12 LAB — CBC WITH DIFFERENTIAL/PLATELET
Abs Immature Granulocytes: 0.01 10*3/uL (ref 0.00–0.07)
Basophils Absolute: 0 10*3/uL (ref 0.0–0.1)
Basophils Relative: 1 %
Eosinophils Absolute: 0.4 10*3/uL (ref 0.0–0.5)
Eosinophils Relative: 5 %
HCT: 32.3 % — ABNORMAL LOW (ref 39.0–52.0)
Hemoglobin: 11.5 g/dL — ABNORMAL LOW (ref 13.0–17.0)
Immature Granulocytes: 0 %
Lymphocytes Relative: 30 %
Lymphs Abs: 2.2 10*3/uL (ref 0.7–4.0)
MCH: 27.4 pg (ref 26.0–34.0)
MCHC: 35.6 g/dL (ref 30.0–36.0)
MCV: 76.9 fL — ABNORMAL LOW (ref 80.0–100.0)
Monocytes Absolute: 0.7 10*3/uL (ref 0.1–1.0)
Monocytes Relative: 9 %
Neutro Abs: 4.1 10*3/uL (ref 1.7–7.7)
Neutrophils Relative %: 55 %
Platelets: 224 10*3/uL (ref 150–400)
RBC: 4.2 MIL/uL — ABNORMAL LOW (ref 4.22–5.81)
RDW: 13.2 % (ref 11.5–15.5)
WBC: 7.4 10*3/uL (ref 4.0–10.5)
nRBC: 0 % (ref 0.0–0.2)

## 2023-05-12 LAB — GLUCOSE, CAPILLARY
Glucose-Capillary: 147 mg/dL — ABNORMAL HIGH (ref 70–99)
Glucose-Capillary: 135 mg/dL — ABNORMAL HIGH (ref 70–99)
Glucose-Capillary: 153 mg/dL — ABNORMAL HIGH (ref 70–99)
Glucose-Capillary: 182 mg/dL — ABNORMAL HIGH (ref 70–99)
Glucose-Capillary: 285 mg/dL — ABNORMAL HIGH (ref 70–99)

## 2023-05-12 LAB — CULTURE, BLOOD (ROUTINE X 2): Special Requests: ADEQUATE

## 2023-05-12 LAB — MAGNESIUM: Magnesium: 1.7 mg/dL (ref 1.7–2.4)

## 2023-05-12 SURGERY — AMPUTATION, FOOT, TRANSMETATARSAL
Anesthesia: Monitor Anesthesia Care | Site: Foot | Laterality: Right

## 2023-05-12 MED ORDER — PROPOFOL 10 MG/ML IV BOLUS
INTRAVENOUS | Status: DC | PRN
  Administered 2023-05-12: 20 mg via INTRAVENOUS
  Administered 2023-05-12: 75 ug/kg/min via INTRAVENOUS

## 2023-05-12 MED ORDER — FENTANYL CITRATE (PF) 100 MCG/2ML IJ SOLN
INTRAMUSCULAR | Status: AC
  Administered 2023-05-12: 50 ug via INTRAVENOUS
  Filled 2023-05-12: qty 2

## 2023-05-12 MED ORDER — FENTANYL CITRATE (PF) 100 MCG/2ML IJ SOLN: 50.0000 ug | Freq: Once | INTRAMUSCULAR | Status: AC

## 2023-05-12 MED ORDER — INSULIN ASPART 100 UNIT/ML IJ SOLN: 0.0000 [IU] | INTRAMUSCULAR | Status: DC | PRN

## 2023-05-12 MED ORDER — PROPOFOL 10 MG/ML IV BOLUS
INTRAVENOUS | Status: AC
  Filled 2023-05-12: qty 20

## 2023-05-12 MED ORDER — ROPIVACAINE HCL 5 MG/ML IJ SOLN
INTRAMUSCULAR | Status: DC | PRN
  Administered 2023-05-12: 15 mL via PERINEURAL
  Administered 2023-05-12: 30 mL via PERINEURAL

## 2023-05-12 MED ORDER — LACTATED RINGERS IV SOLN: INTRAVENOUS | Status: DC

## 2023-05-12 MED ORDER — ONDANSETRON HCL 4 MG/2ML IJ SOLN
INTRAMUSCULAR | Status: AC
  Filled 2023-05-12: qty 2

## 2023-05-12 MED ORDER — 0.9 % SODIUM CHLORIDE (POUR BTL) OPTIME
TOPICAL | Status: DC | PRN
  Administered 2023-05-12: 1000 mL

## 2023-05-12 MED ORDER — BACITRACIN ZINC 500 UNIT/GM EX OINT
TOPICAL_OINTMENT | CUTANEOUS | Status: DC | PRN
  Administered 2023-05-12: 1 via TOPICAL

## 2023-05-12 MED ORDER — CHLORHEXIDINE GLUCONATE 0.12 % MT SOLN
15.0000 mL | Freq: Once | OROMUCOSAL | Status: AC
  Administered 2023-05-12: 15 mL via OROMUCOSAL
  Filled 2023-05-12: qty 15

## 2023-05-12 MED ORDER — ONDANSETRON HCL 4 MG/2ML IJ SOLN
INTRAMUSCULAR | Status: DC | PRN
  Administered 2023-05-12: 4 mg via INTRAVENOUS

## 2023-05-12 MED ORDER — MIDAZOLAM HCL 2 MG/2ML IJ SOLN
INTRAMUSCULAR | Status: AC
  Filled 2023-05-12: qty 2

## 2023-05-12 MED ORDER — FENTANYL CITRATE (PF) 250 MCG/5ML IJ SOLN
INTRAMUSCULAR | Status: AC
  Filled 2023-05-12: qty 5

## 2023-05-12 MED ORDER — BACITRACIN ZINC 500 UNIT/GM EX OINT
TOPICAL_OINTMENT | CUTANEOUS | Status: AC
  Filled 2023-05-12: qty 28.35

## 2023-05-12 MED ORDER — EPHEDRINE SULFATE-NACL 50-0.9 MG/10ML-% IV SOSY
PREFILLED_SYRINGE | INTRAVENOUS | Status: DC | PRN
  Administered 2023-05-12: 5 mg via INTRAVENOUS

## 2023-05-12 MED ORDER — DEXAMETHASONE SODIUM PHOSPHATE 10 MG/ML IJ SOLN
INTRAMUSCULAR | Status: DC | PRN
  Administered 2023-05-12 (×2): 5 mg

## 2023-05-12 MED ORDER — ORAL CARE MOUTH RINSE: 15.0000 mL | Freq: Once | OROMUCOSAL | Status: AC

## 2023-05-12 MED ORDER — HYDROXYZINE HCL 10 MG PO TABS
10.0000 mg | ORAL_TABLET | Freq: Once | ORAL | Status: AC
  Administered 2023-05-13: 10 mg via ORAL
  Filled 2023-05-12: qty 1

## 2023-05-12 MED ORDER — PHENYLEPHRINE HCL-NACL 20-0.9 MG/250ML-% IV SOLN
INTRAVENOUS | Status: DC | PRN
  Administered 2023-05-12: 30 ug/min via INTRAVENOUS

## 2023-05-12 SURGICAL SUPPLY — 35 items
BAG COUNTER SPONGE SURGICOUNT (BAG) ×1 IMPLANT
BAG SPNG CNTER NS LX DISP (BAG) ×1
BLADE AVERAGE 25X9 (BLADE) IMPLANT
BNDG ELASTIC 4X5.8 VLCR STR LF (GAUZE/BANDAGES/DRESSINGS) ×1 IMPLANT
BNDG GAUZE DERMACEA FLUFF 4 (GAUZE/BANDAGES/DRESSINGS) ×1 IMPLANT
BNDG GZE 12X3 1 PLY HI ABS (GAUZE/BANDAGES/DRESSINGS) ×1
BNDG GZE DERMACEA 4 6PLY (GAUZE/BANDAGES/DRESSINGS) ×1
BNDG STRETCH GAUZE 3IN X12FT (GAUZE/BANDAGES/DRESSINGS) ×1 IMPLANT
CANISTER SUCT 3000ML PPV (MISCELLANEOUS) ×1 IMPLANT
CLIP TI MEDIUM 6 (CLIP) IMPLANT
CLIP TI WIDE RED SMALL 6 (CLIP) IMPLANT
COVER SURGICAL LIGHT HANDLE (MISCELLANEOUS) ×1 IMPLANT
DRAPE HALF SHEET 40X57 (DRAPES) ×1 IMPLANT
DRAPE ORTHO SPLIT 77X108 STRL (DRAPES) ×2
DRAPE SURG ORHT 6 SPLT 77X108 (DRAPES) ×2 IMPLANT
ELECT REM PT RETURN 9FT ADLT (ELECTROSURGICAL) ×1
ELECTRODE REM PT RTRN 9FT ADLT (ELECTROSURGICAL) ×1 IMPLANT
GAUZE SPONGE 4X4 12PLY STRL (GAUZE/BANDAGES/DRESSINGS) ×1 IMPLANT
GLOVE BIO SURGEON STRL SZ7.5 (GLOVE) ×1 IMPLANT
GLOVE BIOGEL PI IND STRL 8 (GLOVE) ×1 IMPLANT
GLOVE SURG POLY ORTHO LF SZ7.5 (GLOVE) IMPLANT
GLOVE SURG UNDER LTX SZ8 (GLOVE) ×1 IMPLANT
GOWN STRL REUS W/ TWL LRG LVL3 (GOWN DISPOSABLE) ×3 IMPLANT
GOWN STRL REUS W/TWL LRG LVL3 (GOWN DISPOSABLE) ×3
KIT BASIN OR (CUSTOM PROCEDURE TRAY) ×1 IMPLANT
KIT TURNOVER KIT B (KITS) ×1 IMPLANT
NS IRRIG 1000ML POUR BTL (IV SOLUTION) ×1 IMPLANT
PACK GENERAL/GYN (CUSTOM PROCEDURE TRAY) ×1 IMPLANT
PAD ARMBOARD 7.5X6 YLW CONV (MISCELLANEOUS) ×2 IMPLANT
STAPLER VISISTAT 35W (STAPLE) IMPLANT
SUT ETHILON 3 0 PS 1 (SUTURE) ×1 IMPLANT
TOWEL GREEN STERILE (TOWEL DISPOSABLE) ×2 IMPLANT
TOWEL GREEN STERILE FF (TOWEL DISPOSABLE) ×1 IMPLANT
UNDERPAD 30X36 HEAVY ABSORB (UNDERPADS AND DIAPERS) ×1 IMPLANT
WATER STERILE IRR 1000ML POUR (IV SOLUTION) ×1 IMPLANT

## 2023-05-12 NOTE — Anesthesia Procedure Notes (Signed)
Anesthesia Regional Block: Adductor canal block   Pre-Anesthetic Checklist: , timeout performed,  Correct Patient, Correct Site, Correct Laterality,  Correct Procedure, Correct Position, site marked,  Risks and benefits discussed,  Pre-op evaluation,  At surgeon's request and post-op pain management  Laterality: Right  Prep: Maximum Sterile Barrier Precautions used, chloraprep       Needles:  Injection technique: Single-shot  Needle Type: Echogenic Stimulator Needle     Needle Length: 9cm  Needle Gauge: 21     Additional Needles:   Procedures:,,,, ultrasound used (permanent image in chart),,    Narrative:  Start time: 05/12/2023 11:46 AM End time: 05/12/2023 11:49 AM Injection made incrementally with aspirations every 5 mL. Anesthesiologist: Elmer Picker, MD

## 2023-05-12 NOTE — Progress Notes (Signed)
Progress Note   Patient: Jose [name] ZOX:[id number] DOB: 30-Jul-1961 DOA: 05/09/2023     3 DOS: the patient was seen and examined on 05/12/2023   Brief hospital course: 62yo male with h/o remote polysubstance abuse (ETOH, cocaine), PVD, CAD s/p stents, stage 3 CKD, COPD, DM, HTN, and HLD who presented on 6/15 for necrotic foot.  He was recently admitted from 5/31-6/6 for dry gangrene and underwent lithotripsy of the R popliteal artery and angioplasty, plan to follow up with vascular surgery in a month to allow necrotic foot to demarcate. During that visit he was also noted to have an incidental pancreatic tail mass that was suspicion for pancreatic CA and was recommended to have outpatient MRCP. Pain and discoloration were worsening so he returned to the ER. Evaluation appears to show progressive necrosis despite apparently intact circulation.  Vascular surgery is consulting and ordered ABIs (B noncompressible LE arteries with abnormal B toe-brachial indices) and RLE arterial duplex (calcified but patent vessels throughout with flow noted).  R TMA planned for 6/18.  Assessment and Plan:  Gangrene of R foot/PVD -Recent hospitalization for the same, now with worsening symptoms -During his last hospitalization he underwent successful revascularization -Now with progressive necrosis, surrounding erythema with possible cellulitis -He appears to need amputation, likely TMA - vascular is arranging but he is at risk of poor healing and is aware -Normal lactate, CRP, ESR, prealbumin; blood cultures negative x 2 days -Will treat with IV antibiotics (Rocephin/Flagyl/Vanc as per the lower extremity wound algorithm) -Vascular surgery consulting -LE wound order set utilized including consults (diabetes coordinator; peripheral vascular navigator; TOC team; and nutrition)  -Continue ASA, will hold Plavix for now unless vascular says it is ok to continue -Continue gabapentin -Continue oxy, changed morphine  to Dilaudid for improved pain control - this is significantly improved now -Planned for TMA on 6/18   Incidental finding of Pancreatic Tail Mass -Noted during last hospitalization, concerning for pancreatic cancer. -CA19-9 was 13 -[facility].   -He had a telephone visit on 6/11 and appears to have been referred for MRCP, although this does not appear to have been done yet -Consider Trouseau syndrome for his ischemic toes (migratory thrombophlebitis associated with undiagnosed malignancy), but appears to be more likely related to chronic uncontrolled DM and HTN -Consider inpatient MRCP since depending on his clinical course it may be some time until he is able to effectively follow up as an outpatient for further treatment   Stage 3b CKD -Appears to be stable at this time -Attempt to avoid nephrotoxic medications -Recheck BMP in AM    Acute on Chronic Hyponatremia -Baseline appears to be 128-130, stable -Suspect beer potomania with probable volume deficiency in the setting of necrotic foot on admission -Will continue to hydrate and follow   DM -Recent A1c was 7.7 -hold alogliptin -Continue Lantus -Cover with sensitive-scale SSI    Hyperkalemia/Metabolic acidosis -Not receiving iatrogenic K+ -Worsening acidosis in the setting of foot necrosis, no anion gap currently -Will hold Mag++ supplementation for now -Resolved on 6/18 -Recheck BMP with Mag++ in AM   HTN, refractory -Continue amlodipine, carvedilol, hydralazine   HLD -Continue atorvastatin -Hold fish oil due to limited inpatient utility   COPD -Appears to be compensated -Continue Advair (Dulera per formulary), Albuterol -Hold Duonebs (uses prn)   Constipation -Continue Miralax and Senokot S BID   CAD -No acute concerns -Continue ASA 81 and hold Plavix for now   Glaucoma -Continue  Brimonidine, brinzolamide, and Latanoprost    Insomnia -Reports that he  finally slept for the first time 6/17 since his pain was controlled -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  Physical Exam: Vitals:   05/12/23 0425 05/12/23 0840 05/12/23 0853 05/12/23 1103  BP: (!) 145/70  (!) 173/77 (!) 141/54  Pulse: 80 82 63 (!) 56  Resp: 19 18 18 17   Temp: 97.9 F (36.6 C)  98.3 F (36.8 C) 98.1 F (36.7 C)  TempSrc: Oral  Oral   SpO2: 98% 98% 100% 98%  Weight:       General:  Appears calm and comfortable and is in NAD Eyes:  EOMI, normal lids, iris ENT:  grossly normal hearing, lips & tongue, mmm Neck:  no LAD, masses or thyromegaly Cardiovascular:  RRR, no m/r/g. No LE edema.  Respiratory:   CTA bilaterally with no wheezes/rales/rhonchi.  Normal respiratory effort. Abdomen:  soft, NT, ND Skin:  mildly worsening necrosis of R great toe, otherwise unchanged Musculoskeletal:  grossly normal tone BUE/BLE, good ROM Psychiatric:  somnolent mood and affect, speech fluent and appropriate, AOx3 Neurologic:  CN 2-12 grossly intact, moves all extremities in coordinated fashion    Pertinent labs:    Na++ 132 CO2 21 Glucose 170 BUN 21/Creatinine 1.63/GFR 47 - stable WBC 7.4 Hgb 11.5 - stable  Family Communication: None present; he reports that he spoke with his niece last night and did not ask me to call today.  Disposition: Status is: Inpatient Remains inpatient appropriate because: operative procedure today  Planned Discharge Destination:  to be determined    Time spent: 35 minutes  Author: Jonah Blue, MD 05/12/2023 11:07 AM  For on call review [email or web address].

## 2023-05-12 NOTE — Plan of Care (Signed)

## 2023-05-12 NOTE — Transfer of Care (Signed)
Immediate Anesthesia Transfer of Care Note  Patient: Jose [name]  Procedure(s) Performed: TRANSMETATARSAL AMPUTATION (Right: Foot)  Patient Location: PACU  Anesthesia Type:MAC and Regional  Level of Consciousness: awake  Airway & Oxygen Therapy: Patient Spontanous Breathing  Post-op Assessment: Report given to RN and Post -op Vital signs reviewed and stable  Post vital signs: Reviewed and stable  Last Vitals:  Vitals Value Taken Time  BP 130/77 05/12/23 1337  Temp    Pulse 76 05/12/23 1339  Resp 13 05/12/23 1339  SpO2 97 % 05/12/23 1339  Vitals shown include unvalidated device data.  Last Pain:  Vitals:   05/12/23 1126  TempSrc:   PainSc: 10-Worst pain ever      Patients Stated Pain Goal: 2 (05/11/23 1956)  Complications: No notable events documented.

## 2023-05-12 NOTE — Op Note (Signed)
    NAME: Jose [name]    MRN: [id number] DOB: 1961/09/30    DATE OF OPERATION: 05/12/2023  PREOP DIAGNOSIS:    Gangrene right foot  POSTOP DIAGNOSIS:    Same  PROCEDURE:    Right transmetatarsal amputation  SURGEON: Di Kindle. Edilia Bo, MD  ASSIST: None  ANESTHESIA: Block  EBL: Minimal  INDICATIONS:    [name] is a 62 y.o. male who was admitted with a worsening ulceration of the right foot.  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  Patient had a good technical result.  Unfortunately the foot continued to deteriorate.  He was set up for transmetatarsal amputation with significant risk of nonhealing given his small vessel disease.  FINDINGS:   Marginal bleeding at the amputation site.  TECHNIQUE:   The patient was taken to the operating room after a block was placed by anesthesia.  The right foot was prepped and draped in usual sterile fashion.  A fishmouth incision was marked.  Incision was made across the dorsum of the foot and the dissection carried down to the metatarsals which were dissected free circumferentially using electrocautery.  The bone was divided using the CD4 saw.  The incision on the plantar aspect of the foot was made and then the dissection continued with Bovie electrocautery.  Hemostasis was obtained in the wound.  There was marginal bleeding.  The wound was irrigated and then a deep layer was closed with running 3-0 Vicryl.  The skin was closed with staples.  A sterile dressing was applied.  The patient tolerated the procedure well was transferred to the recovery room in stable condition.  All needle and sponge counts were correct.  Waverly Ferrari, MD, FACS [facility]  DATE OF DICTATION:   05/12/2023

## 2023-05-12 NOTE — Anesthesia Procedure Notes (Signed)
Anesthesia Regional Block: Popliteal block   Pre-Anesthetic Checklist: , timeout performed,  Correct Patient, Correct Site, Correct Laterality,  Correct Procedure, Correct Position, site marked,  Risks and benefits discussed,  Pre-op evaluation,  At surgeon's request and post-op pain management  Laterality: Right  Prep: Maximum Sterile Barrier Precautions used, chloraprep       Needles:  Injection technique: Single-shot  Needle Type: Echogenic Stimulator Needle     Needle Length: 9cm  Needle Gauge: 21     Additional Needles:   Procedures:,,,, ultrasound used (permanent image in chart),,    Narrative:  Start time: 05/12/2023 11:43 AM End time: 05/12/2023 11:46 AM Injection made incrementally with aspirations every 5 mL. Anesthesiologist: Elmer Picker, MD

## 2023-05-12 NOTE — Plan of Care (Signed)

## 2023-05-12 NOTE — Anesthesia Preprocedure Evaluation (Addendum)
Anesthesia Evaluation  Patient identified by MRN, date of birth, ID band Patient awake    Reviewed: Allergy & Precautions, NPO status , Patient's Chart, lab work & pertinent test results, reviewed documented beta blocker date and time   Airway Mallampati: III  TM Distance: >3 FB Neck ROM: Full    Dental no notable dental hx. (+) Teeth Intact, Dental Advisory Given   Pulmonary COPD,  COPD inhaler, former smoker   Pulmonary exam normal breath sounds clear to auscultation       Cardiovascular hypertension, Pt. on medications and Pt. on home beta blockers + angina  + CAD, + Past MI, + Cardiac Stents and + Peripheral Vascular Disease  Normal cardiovascular exam Rhythm:Regular Rate:Normal  TTE 2023  1. Left ventricular ejection fraction, by estimation, is 65 to 70%. The  left ventricle has normal function. The left ventricle has no regional  wall motion abnormalities. There is moderate left ventricular hypertrophy.  Left ventricular diastolic  parameters are consistent with Grade I diastolic dysfunction (impaired  relaxation).   2. Right ventricular systolic function is normal. The right ventricular  size is normal.   3. The mitral valve is grossly normal. Trivial mitral valve  regurgitation.   4. The non-coronary cusp of the aortic valve is thickened and echogenic  with a small <5 mm mobile density noted on the non-coronary cusp -  ?thrombus or unlikely vegetation, possible artfiact - no AI noted. Aortic  valve regurgitation is not visualized.   Cath 2023 Multivessel CAD with previously noted smooth 50% distal left main stenosis; the LAD has 50% ostial stenosis and is a calcified vessel with a patent proximal stent with 25 to 30% intimal hyperplasia; patent large bifurcating ramus intermediate vessel; 70 to 80% ostial groove circumflex stenosis; enlarged tortuous dominant RCA which appears to have 2 overlapping distal stents.  There is  90% in-stent restenosis at the site of a very difficult intervention in September 2021 in which PTCA was performed but the balloon could not fully expand and residual stenosis was felt to be 50%    Neuro/Psych  PSYCHIATRIC DISORDERS Anxiety Depression    negative neurological ROS     GI/Hepatic negative GI ROS,,,(+)     substance abuse  alcohol use and cocaine use  Endo/Other  diabetes, Type 2, Insulin Dependent    Renal/GU Renal InsufficiencyRenal diseaseLab Results      Component                Value               Date                      CREATININE               1.63 (H)            05/12/2023                BUN                      21                  05/12/2023                NA                       132 (L)  05/12/2023                K                        4.1                 05/12/2023                CL                       103                 05/12/2023                CO2                      21 (L)              05/12/2023             negative genitourinary   Musculoskeletal negative musculoskeletal ROS (+)  narcotic dependent  Abdominal   Peds  Hematology  (+) Blood dyscrasia (plavix)   Anesthesia Other Findings   Reproductive/Obstetrics                             Anesthesia Physical Anesthesia Plan  ASA: 3  Anesthesia Plan: MAC and Regional   Post-op Pain Management: Regional block* and Tylenol PO (pre-op)*   Induction: Intravenous  PONV Risk Score and Plan: 1 and Propofol infusion, Treatment may vary due to age or medical condition, Ondansetron and Midazolam  Airway Management Planned: Natural Airway  Additional Equipment:   Intra-op Plan:   Post-operative Plan:   Informed Consent: I have reviewed the patients History and Physical, chart, labs and discussed the procedure including the risks, benefits and alternatives for the proposed anesthesia with the patient or authorized representative who has indicated  his/her understanding and acceptance.     Dental advisory given  Plan Discussed with: CRNA  Anesthesia Plan Comments:        Anesthesia Quick Evaluation

## 2023-05-12 NOTE — Progress Notes (Signed)
Orthopedic Tech Progress Note Patient Details:  Jose [name] [birth year] [id number]  Ortho Devices Type of Ortho Device: Darco shoe Ortho Device/Splint Location: RLE Ortho Device/Splint Interventions: Ordered, Other (comment)   Post Interventions Patient Tolerated: Well Instructions Provided: Care of device  Donald Pore 05/12/2023, 3:19 PM

## 2023-05-12 NOTE — Anesthesia Postprocedure Evaluation (Signed)
Anesthesia Post Note  Patient: Jose [name]  Procedure(s) Performed: TRANSMETATARSAL AMPUTATION (Right: Foot)     Patient location during evaluation: PACU Anesthesia Type: Regional and MAC Level of consciousness: awake and alert Pain management: pain level controlled Vital Signs Assessment: post-procedure vital signs reviewed and stable Respiratory status: spontaneous breathing, nonlabored ventilation, respiratory function stable and patient connected to nasal cannula oxygen Cardiovascular status: stable and blood pressure returned to baseline Postop Assessment: no apparent nausea or vomiting Anesthetic complications: no  No notable events documented.  Last Vitals:  Vitals:   05/12/23 1355 05/12/23 1400  BP: 126/67 125/71  Pulse: 72 69  Resp: 11 13  Temp: 36.7 C 36.8 C  SpO2: 94% 97%    Last Pain:  Vitals:   05/12/23 1400  TempSrc: Oral  PainSc:                  Hines Kloss L Secundino Ellithorpe

## 2023-05-12 NOTE — Interval H&P Note (Signed)
History and Physical Interval Note:  05/12/2023 11:40 AM  Jose [name]  has presented today for surgery, with the diagnosis of Dry gangrene.  The various methods of treatment have been discussed with the patient and family. After consideration of risks, benefits and other options for treatment, the patient has consented to  Procedure(s): TRANSMETATARSAL AMPUTATION (Right) as a surgical intervention.  The patient's history has been reviewed, patient examined, no change in status, stable for surgery.  I have reviewed the patient's chart and labs.  Questions were answered to the patient's satisfaction.     Waverly Ferrari

## 2023-05-13 ENCOUNTER — Inpatient Hospital Stay (HOSPITAL_COMMUNITY): Payer: No Typology Code available for payment source

## 2023-05-13 ENCOUNTER — Encounter (HOSPITAL_COMMUNITY): Payer: Self-pay | Admitting: Vascular Surgery

## 2023-05-13 DIAGNOSIS — I10 Essential (primary) hypertension: Secondary | ICD-10-CM

## 2023-05-13 DIAGNOSIS — J410 Simple chronic bronchitis: Secondary | ICD-10-CM

## 2023-05-13 DIAGNOSIS — N1832 Chronic kidney disease, stage 3b: Secondary | ICD-10-CM

## 2023-05-13 DIAGNOSIS — E872 Acidosis, unspecified: Secondary | ICD-10-CM

## 2023-05-13 DIAGNOSIS — I2510 Atherosclerotic heart disease of native coronary artery without angina pectoris: Secondary | ICD-10-CM

## 2023-05-13 DIAGNOSIS — E118 Type 2 diabetes mellitus with unspecified complications: Secondary | ICD-10-CM

## 2023-05-13 DIAGNOSIS — K8689 Other specified diseases of pancreas: Secondary | ICD-10-CM | POA: Diagnosis not present

## 2023-05-13 DIAGNOSIS — Z794 Long term (current) use of insulin: Secondary | ICD-10-CM

## 2023-05-13 DIAGNOSIS — E875 Hyperkalemia: Secondary | ICD-10-CM

## 2023-05-13 DIAGNOSIS — K5900 Constipation, unspecified: Secondary | ICD-10-CM

## 2023-05-13 DIAGNOSIS — H409 Unspecified glaucoma: Secondary | ICD-10-CM | POA: Diagnosis not present

## 2023-05-13 DIAGNOSIS — E785 Hyperlipidemia, unspecified: Secondary | ICD-10-CM

## 2023-05-13 DIAGNOSIS — E871 Hypo-osmolality and hyponatremia: Secondary | ICD-10-CM

## 2023-05-13 DIAGNOSIS — E1169 Type 2 diabetes mellitus with other specified complication: Secondary | ICD-10-CM

## 2023-05-13 DIAGNOSIS — I739 Peripheral vascular disease, unspecified: Secondary | ICD-10-CM | POA: Diagnosis not present

## 2023-05-13 LAB — CBC WITH DIFFERENTIAL/PLATELET
Abs Immature Granulocytes: 0.01 10*3/uL (ref 0.00–0.07)
Basophils Absolute: 0 10*3/uL (ref 0.0–0.1)
Basophils Relative: 0 %
Eosinophils Absolute: 0 10*3/uL (ref 0.0–0.5)
Eosinophils Relative: 0 %
HCT: 32.3 % — ABNORMAL LOW (ref 39.0–52.0)
Hemoglobin: 11.8 g/dL — ABNORMAL LOW (ref 13.0–17.0)
Immature Granulocytes: 0 %
Lymphocytes Relative: 21 %
Lymphs Abs: 1.7 10*3/uL (ref 0.7–4.0)
MCH: 28.2 pg (ref 26.0–34.0)
MCHC: 36.5 g/dL — ABNORMAL HIGH (ref 30.0–36.0)
MCV: 77.3 fL — ABNORMAL LOW (ref 80.0–100.0)
Monocytes Absolute: 0.1 10*3/uL (ref 0.1–1.0)
Monocytes Relative: 1 %
Neutro Abs: 6.3 10*3/uL (ref 1.7–7.7)
Neutrophils Relative %: 78 %
Platelets: 209 10*3/uL (ref 150–400)
RBC: 4.18 MIL/uL — ABNORMAL LOW (ref 4.22–5.81)
RDW: 13.3 % (ref 11.5–15.5)
WBC: 8 10*3/uL (ref 4.0–10.5)
nRBC: 0 % (ref 0.0–0.2)

## 2023-05-13 LAB — BASIC METABOLIC PANEL
Anion gap: 11 (ref 5–15)
BUN: 30 mg/dL — ABNORMAL HIGH (ref 8–23)
CO2: 17 mmol/L — ABNORMAL LOW (ref 22–32)
Calcium: 8.6 mg/dL — ABNORMAL LOW (ref 8.9–10.3)
Chloride: 101 mmol/L (ref 98–111)
Creatinine, Ser: 1.79 mg/dL — ABNORMAL HIGH (ref 0.61–1.24)
GFR, Estimated: 42 mL/min — ABNORMAL LOW (ref >60)
Glucose, Bld: 305 mg/dL — ABNORMAL HIGH (ref 70–99)
Potassium: 4.2 mmol/L (ref 3.5–5.1)
Sodium: 129 mmol/L — ABNORMAL LOW (ref 135–145)

## 2023-05-13 LAB — GLUCOSE, CAPILLARY
Glucose-Capillary: 251 mg/dL — ABNORMAL HIGH (ref 70–99)
Glucose-Capillary: 318 mg/dL — ABNORMAL HIGH (ref 70–99)
Glucose-Capillary: 222 mg/dL — ABNORMAL HIGH (ref 70–99)
Glucose-Capillary: 252 mg/dL — ABNORMAL HIGH (ref 70–99)

## 2023-05-13 LAB — MAGNESIUM: Magnesium: 1.6 mg/dL — ABNORMAL LOW (ref 1.7–2.4)

## 2023-05-13 MED ORDER — INSULIN GLARGINE-YFGN 100 UNIT/ML ~~LOC~~ SOLN
5.0000 [IU] | Freq: Once | SUBCUTANEOUS | Status: AC
  Administered 2023-05-13: 5 [IU] via SUBCUTANEOUS
  Filled 2023-05-13: qty 0.05

## 2023-05-13 MED ORDER — INSULIN GLARGINE-YFGN 100 UNIT/ML ~~LOC~~ SOLN
30.0000 [IU] | Freq: Every day | SUBCUTANEOUS | Status: DC
  Administered 2023-05-14: 30 [IU] via SUBCUTANEOUS
  Filled 2023-05-13: qty 0.3

## 2023-05-13 MED ORDER — HYDROXYZINE HCL 25 MG PO TABS
25.0000 mg | ORAL_TABLET | Freq: Three times a day (TID) | ORAL | Status: DC | PRN
  Administered 2023-05-13 – 2023-05-18 (×6): 25 mg via ORAL
  Filled 2023-05-13 (×5): qty 1

## 2023-05-13 MED ORDER — HYDROMORPHONE HCL 1 MG/ML IJ SOLN
0.5000 mg | Freq: Once | INTRAMUSCULAR | Status: AC
  Administered 2023-05-13: 0.5 mg via INTRAVENOUS
  Filled 2023-05-13: qty 0.5

## 2023-05-13 MED ORDER — MAGNESIUM SULFATE 4 GM/100ML IV SOLN
4.0000 g | Freq: Once | INTRAVENOUS | Status: AC
  Administered 2023-05-13: 4 g via INTRAVENOUS
  Filled 2023-05-13: qty 100

## 2023-05-13 NOTE — Progress Notes (Signed)
Orthopedic Tech Progress Note Patient Details:  Jose [name] [birth year] [id number]  Dropped off [facility] SHOE yesterday   Patient ID: Jose [name] Jose [name], male   DOB: 09-21-1961, 62 y.o.   MRN: [id number]  Jose [name] 05/13/2023, 7:59 AM

## 2023-05-13 NOTE — Evaluation (Addendum)
Physical Therapy Evaluation Patient Details Name: Jose [name] MRN: [id number] DOB: 01/10/1961 Today's Date: 05/13/2023  History of Present Illness  62 y.o. male admitted 6/15 with ulceration of the right foot. Underwent TRANSMETATARSAL AMPUTATION 6/18. 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 medical history significant of CAD status post stenting x 2, CKD stage II, COPD, IDDM, HTN.  Clinical Impression  Patient is s/p above surgery resulting in functional limitations due to the deficits listed below (see PT Problem List). Previously independent with use of SPC. Currently at supervision level with transfer and gait. Distance limited by Rt foot pain when LE in dependent position. Pt ambulate safely with PWB through Rt heel only (mostly at NWB level today.) No additional drainage noted after evaluation; there was some dried blood present in bed prior to session. Plans to stay with family at d/c. Patient will benefit from acute skilled PT to increase their independence and safety with mobility to facilitate discharge.        Recommendations for follow up therapy are one component of a multi-disciplinary discharge planning process, led by the attending physician.  Recommendations may be updated based on patient status, additional functional criteria and insurance authorization.     Assistance Recommended at Discharge Set up Supervision/Assistance  Patient can return home with the following  A little help with walking and/or transfers;Assist for transportation;Help with stairs or ramp for entrance    Equipment Recommendations Rolling walker (2 wheels) (r)     Functional Status Assessment Patient has had a recent decline in their functional status and demonstrates the ability to make significant improvements in function in a reasonable and predictable amount of time.     Precautions / Restrictions Precautions Precautions: Fall Required  Braces or Orthoses: Other Brace Other Brace: Darco shoe Restrictions Weight Bearing Restrictions: Yes RLE Weight Bearing: Partial weight bearing RLE Partial Weight Bearing Percentage or Pounds: Through heel only with Darco shoe Other Position/Activity Restrictions: PWB through heel only on Rt      Mobility  Bed Mobility Overal bed mobility: Modified Independent             General bed mobility comments: extra time    Transfers Overall transfer level: Needs assistance Equipment used: Rolling walker (2 wheels) Transfers: Sit to/from Stand Sit to Stand: Supervision           General transfer comment: Supervision for safety, cues for hand placement and technique to rise. Performed from bed x2.    Ambulation/Gait Ambulation/Gait assistance: Supervision Gait Distance (Feet): 25 Feet Assistive device: Rolling walker (2 wheels) Gait Pattern/deviations: Step-to pattern, Decreased dorsiflexion - right Gait velocity: decr Gait velocity interpretation: <1.31 ft/sec, indicative of household ambulator   General Gait Details: Educated on safe AD use and sequencing for PWB on Rt heel touch only with darco shoe. Distance limited by increasing pain in upright position. Safely navigated with RW for support. Cues for ankle DF as able  Stairs            Wheelchair Mobility    Modified Rankin (Stroke Patients Only)       Balance Overall balance assessment: Needs assistance Sitting-balance support: Feet supported Sitting balance-Leahy Scale: Normal     Standing balance support: Bilateral upper extremity supported, During functional activity Standing balance-Leahy Scale: Poor                               Pertinent  Vitals/Pain Pain Assessment Pain Assessment: Faces Faces Pain Scale: Hurts even more Pain Location: rt foot Pain Descriptors / Indicators: Aching, Burning (itching) Pain Intervention(s): Monitored during session, Repositioned    Home  Living Family/patient expects to be discharged to:: Private residence Living Arrangements: Alone (Going to stay with family for a while.) Available Help at Discharge: Friend(s);Available PRN/intermittently Type of Home: House Home Access: Level entry       Home Layout: One level Home Equipment: Cane - single point      Prior Function Prior Level of Function : Independent/Modified Independent             Mobility Comments: SPC to ambulate ADLs Comments: ind with SPC     Hand Dominance   Dominant Hand: Right    Extremity/Trunk Assessment   Upper Extremity Assessment Upper Extremity Assessment: Defer to OT evaluation    Lower Extremity Assessment Lower Extremity Assessment: RLE deficits/detail RLE Deficits / Details: Rt foot bandaged RLE: Unable to fully assess due to pain;Unable to fully assess due to immobilization       Communication   Communication: No difficulties  Cognition Arousal/Alertness: Awake/alert Behavior During Therapy: WFL for tasks assessed/performed Overall Cognitive Status: Within Functional Limits for tasks assessed                                          General Comments General comments (skin integrity, edema, etc.): Some bloody drainage from lateral plantar surface present in bed but dry. No additional drainage noted following ambulatory bout. Kept RLE in elevated position end of session. RN notified need for pain medication.    Exercises     Assessment/Plan    PT Assessment Patient needs continued PT services  PT Problem List Decreased strength;Decreased range of motion;Decreased activity tolerance;Decreased balance;Decreased mobility;Decreased knowledge of use of DME;Decreased knowledge of precautions;Impaired sensation;Pain       PT Treatment Interventions DME instruction;Gait training;Functional mobility training;Stair training;Therapeutic activities;Therapeutic exercise;Balance training;Neuromuscular  re-education;Patient/family education;Modalities    PT Goals (Current goals can be found in the Care Plan section)  Acute Rehab PT Goals Patient Stated Goal: Get well PT Goal Formulation: With patient Time For Goal Achievement: 05/20/23 Potential to Achieve Goals: Good    Frequency Min 3X/week     Co-evaluation               AM-PAC PT "6 Clicks" Mobility  Outcome Measure Help needed turning from your back to your side while in a flat bed without using bedrails?: None Help needed moving from lying on your back to sitting on the side of a flat bed without using bedrails?: None Help needed moving to and from a bed to a chair (including a wheelchair)?: A Little Help needed standing up from a chair using your arms (e.g., wheelchair or bedside chair)?: A Little Help needed to walk in hospital room?: A Little Help needed climbing 3-5 steps with a railing? : A Little 6 Click Score: 20    End of Session Equipment Utilized During Treatment: Gait belt;Other (comment) (Rt Darco shoe) Activity Tolerance: Patient limited by pain Patient left: with call bell/phone within reach;in chair;with chair alarm set Nurse Communication: Patient requests pain meds PT Visit Diagnosis: Pain Pain - Right/Left: Right Pain - part of body: Ankle and joints of foot    Time: 1716-1736 PT Time Calculation (min) (ACUTE ONLY): 20 min   Charges:  PT Evaluation $PT Eval Low Complexity: 1 Low          Kathlyn Sacramento, PT, DPT [facility]  Rehabilitation Services Physical Therapist Office: [phone] Website: [email or web address]   Berton Mount 05/13/2023, 6:25 PM

## 2023-05-13 NOTE — Progress Notes (Signed)
  VASCULAR SURGERY ASSESSMENT & PLAN:   POD 1 RIGHT TMA: Dressing dry. Begin dressing changes tomorrow.   PTx PWB right foot heel only.   SUBJECTIVE:   Pain well controlled  PHYSICAL EXAM:   Vitals:   05/12/23 1627 05/12/23 2045 05/13/23 0000 05/13/23 0347  BP: 134/78 134/81 106/63 (!) 147/82  Pulse: 77 82 72 83  Resp: 14 17 17 17   Temp: 98.2 F (36.8 C) 98.5 F (36.9 C) 98 F (36.7 C) 98.8 F (37.1 C)  TempSrc: Axillary Oral Oral   SpO2: 98% 98% 98% 97%  Weight:       Dressing dry.   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) Active Problems:   Hypertension   Hyponatremia   Type 2 diabetes mellitus with complication, with long-term current use of insulin (HCC)   Hyperkalemia   Dyslipidemia associated with type 2 diabetes mellitus (HCC)   CKD (chronic kidney disease) stage 3, GFR 30-59 ml/min   COPD (chronic obstructive pulmonary disease) (HCC)   CAD (coronary artery disease)   Pancreatic mass   Constipation   Glaucoma   Metabolic acidosis   CURRENT MEDS:    amitriptyline  25 mg Oral QHS   amLODipine  10 mg Oral Daily   aspirin EC  81 mg Oral Daily   atorvastatin  80 mg Oral Daily   brinzolamide  1 drop Both Eyes TID   And   brimonidine  1 drop Both Eyes TID   carvedilol  12.5 mg Oral BID WC   docusate sodium  100 mg Oral BID   enoxaparin (LOVENOX) injection  40 mg Subcutaneous Q24H   feeding supplement (GLUCERNA SHAKE)  237 mL Oral BID BM   folic acid  1 mg Oral Daily   gabapentin  300 mg Oral QPM   hydrALAZINE  100 mg Oral BID   hydrOXYzine  25 mg Oral Q0600    insulin aspart  0-5 Units Subcutaneous QHS   insulin aspart  0-9 Units Subcutaneous TID WC   insulin glargine-yfgn  25 Units Subcutaneous Daily   latanoprost  1 drop Both Eyes QHS   mometasone-formoterol  2 puff Inhalation BID   multivitamin with minerals  1 tablet Oral Daily   pantoprazole  40 mg Oral BID AC   polyethylene glycol  17 g Oral BID   senna-docusate  1 tablet Oral BID   thiamine  100 mg Oral Daily   Or   thiamine  100 mg Intravenous Daily   traZODone  75 mg Oral QHS    Waverly Ferrari Office: [phone] 05/13/2023

## 2023-05-13 NOTE — Progress Notes (Signed)
Pharmacy Antibiotic Note  Jose [name] is a 62 y.o. male admitted on 05/09/2023 with  wound infection .  Pharmacy has been consulted for vancomycin dosing.S/p TMA 6/18.  Plan: Continue ceftriaxone for another 24h per [facility], ok to stop vanc/flagyl  Weight: 81.6 kg (179 lb 14.3 oz)  Temp (24hrs), Avg:98.2 F (36.8 C), Min:98 F (36.7 C), Max:98.8 F (37.1 C)  Recent Labs  Lab 05/09/23 1014 05/09/23 1652 05/10/23 0140 05/11/23 1039 05/12/23 0140 05/13/23 0159  WBC 9.6  --  7.9 7.9 7.4 8.0  CREATININE 1.73*  --  1.60* 1.64* 1.63* 1.79*  LATICACIDVEN  --  1.2  --   --   --   --      Estimated Creatinine Clearance: 44.4 mL/min (A) (by C-G formula based on SCr of 1.79 mg/dL (H)).    Allergies  Allergen Reactions   Lisinopril-Hydrochlorothiazide Swelling and Other (See Comments)    Causes swelling of lips   Simvastatin Other (See Comments)    ALT (SGPT) level raised, Aspartate aminotransferase serum level raised   Thank you for allowing pharmacy to be a part of this patient's care.  Rexford Maus, PharmD, BCPS 05/13/2023 10:40 AM

## 2023-05-13 NOTE — Progress Notes (Signed)
PROGRESS NOTE    Jose [name]  WUJ:[id number] DOB: 1961-01-01 DOA: 05/09/2023 PCP: [name]    Chief Complaint  Patient presents with   Necrotic Toes Rt Foot    Brief Narrative:  62yo male with h/o remote polysubstance abuse (ETOH, cocaine), PVD, CAD s/p stents, stage 3 CKD, COPD, DM, HTN, and HLD who presented on 6/15 for necrotic foot.  He was recently admitted from 5/31-6/6 for dry gangrene and underwent lithotripsy of the R popliteal artery and angioplasty, plan to follow up with vascular surgery in a month to allow necrotic foot to demarcate. During that visit he was also noted to have an incidental pancreatic tail mass that was suspicion for pancreatic CA and was recommended to have outpatient MRCP. Pain and discoloration were worsening so he returned to the ER. Evaluation appears to show progressive necrosis despite apparently intact circulation.  Vascular surgery is consulting and ordered ABIs (B noncompressible LE arteries with abnormal B toe-brachial indices) and RLE arterial duplex (calcified but patent vessels throughout with flow noted).  Status post R TMA planned 6/18.     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 -During last hospitalization patient underwent successful revascularization. -Patient presented with progressive necrosis, surrounding  erythema possible cellulitis. -Patient seen by vascular surgery, noted to be at risk for poor healing and vascular surgery recommended TMA which patient underwent 05/12/2023. -Patient with normal lactate, CRP, sed rate, prealbumin. -Blood cultures with no growth x 4 days. -Plavix on hold. -IV Rocephin. -Continue aspirin, gabapentin, oxycodone, Dilaudid for pain control. -Per vascular surgery dressing changes to start tomorrow, partial weightbearing right foot and heel only. -Per vascular surgery.  2.  Incidental finding of pancreatic tail mass -Noted during last hospitalization concerning for pancreatic cancer. -CA 19-9 was 13. -Patient is scheduled for outpatient follow-up with [facility] and for outpatient MRCP. -It is noted that patient had a telephone visit 611 appears to have been referred for MRCP although this does not appear to have been done yet. -Per Dr. Ophelia Charter may need to consider Trousseau syndrome for his ischemic toes (migratory thrombophlebitis associated undiagnosed malignancy), however ischemic toes likely related to chronic uncontrolled diabetes and hypertension. -MRCP during this hospitalization for further evaluation and subsequently outpatient follow-up.  3.  CKD stage IIIb -Stable.  4.  Acute on chronic hyponatremia -Baseline sodium 128-1 30. -Continue hydration.  5.  Hypomagnesemia -Magnesium sulfate 4 g IV x 1. -Repeat labs in the AM.  6.  Diabetes mellitus type 2 -Hemoglobin A1c 7.7 (04/24/2023) -CBG 251 this morning. -Increase Lantus to 30 units daily. -SSI.  7.  Hypertension -Continue Norvasc 10 mg daily, Coreg 12.5 mg twice daily, hydralazine 100 mg twice daily, may increase to 3 times daily if needed for better blood pressure control.  -IV hydralazine as needed.  8.  Hyperkalemia/metabolic acidosis -Not receiving iatrogenic potassium. -Patient noted with worsening acidosis in  the setting of foot necrosis, no anion gap currently. -Hyperkalemia  resolved. -Bicarb trending up however with CKD May need oral bicarb supplementation will follow for now.  9.  Hyperlipidemia -Statin.  10.  COPD -Stable. -Continue Dulera, albuterol.  11.  Constipation -Continue current bowel regimen with MiraLAX and Senokot-S.  12.  CAD -Stable. -Continue aspirin. -Plavix on hold, will defer resumption of Plavix to vascular surgery.  13.  Glaucoma -Continue brimonidine, brinzolamide, latanoprost.  14.  Insomnia -Patient noted to have reportedly finally slept for the first time 617 as pain was better controlled. 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 -Noted not to like trazodone effects. -Patient started on amitriptyline at bedtime on 617 to hopefully also help with his neuropathy.   DVT prophylaxis: Lovenox Code Status: Full Family Communication: Updated patient.  No family at bedside. Disposition: TBD  Status is: Inpatient Remains inpatient appropriate because: Severity of illness.   Consultants:  Vascular surgery :Dr Lenell Antu 05/09/2023  Procedures:  ABI 05/09/2023 Right Transmetatarsal amputation 05/12/2023 Per vascular surgery: Dr Edilia Bo 05/12/2023  Antimicrobials:  Anti-infectives (From admission, onward)    Start     Dose/Rate Route Frequency Ordered Stop   05/10/23 1300  vancomycin (VANCOREADY) IVPB 1250 mg/250 mL  Status:  Discontinued        1,250 mg 166.7 mL/hr over 90 Minutes Intravenous Every 24 hours 05/09/23 1250 05/13/23 1013   05/09/23 1300  cefTRIAXone (ROCEPHIN) 2 g in sodium chloride 0.9 % 100 mL IVPB        2 g 200 mL/hr over 30 Minutes Intravenous Every 24 hours 05/09/23 1245 05/14/23 2359   05/09/23 1300  metroNIDAZOLE (FLAGYL) IVPB 500 mg  Status:  Discontinued        500 mg 100 mL/hr over 60 Minutes Intravenous Every 8 hours 05/09/23 1245 05/13/23 1013   05/09/23 1300  vancomycin (VANCOREADY) IVPB 1750 mg/350 mL        1,750 mg 175 mL/hr over 120 Minutes Intravenous  Once  05/09/23 1250 05/09/23 2044         Subjective: Laying in bed.  States he feels the best he has since amputation.  Denies any chest pain.  No shortness of breath.  No abdominal pain.  Very grateful for the care he is receiving.  Objective: Vitals:   05/13/23 0000 05/13/23 0347 05/13/23 0730 05/13/23 0832  BP: 106/63 (!) 147/82 135/74   Pulse: 72 83 78   Resp: 17 17 14    Temp: 98 F (36.7 C) 98.8 F (37.1 C) 98.2 F (36.8 C)   TempSrc: Oral  Oral   SpO2: 98% 97% 100% 98%  Weight:        Intake/Output Summary (Last 24 hours) at 05/13/2023 1239 Last data filed at 05/13/2023 0715 Gross per 24 hour  Intake 1090 ml  Output 1550 ml  Net -460 ml   Filed Weights   05/09/23 1200  Weight: 81.6 kg    Examination:  General exam: Appears calm and comfortable  Respiratory system: Clear to auscultation. Respiratory effort normal. Cardiovascular system: S1 & S2 heard, RRR. No JVD, murmurs, rubs, gallops or clicks. No pedal edema. Gastrointestinal system: Abdomen is nondistended, soft and nontender. No organomegaly or masses felt. Normal bowel sounds heard. Central nervous system: Alert and oriented. No focal neurological deficits. Extremities: Patient.  Status post right foot TMA in postop bandage.  Skin: No rashes, lesions or ulcers Psychiatry: Judgement and insight appear normal. Mood & affect appropriate.  Data Reviewed: I have personally reviewed following labs and imaging studies  CBC: Recent Labs  Lab 05/09/23 1014 05/10/23 0140 05/11/23 1039 05/12/23 0140 05/13/23 0159  WBC 9.6 7.9 7.9 7.4 8.0  NEUTROABS 7.7  --  4.2 4.1 6.3  HGB 14.2 11.4* 11.9* 11.5* 11.8*  HCT 38.7* 31.1* 32.1* 32.3* 32.3*  MCV 76.9* 75.3* 77.7* 76.9* 77.3*  PLT 276 225 PLATELET CLUMPS NOTED ON SMEAR, UNABLE TO ESTIMATE 224 209    Basic Metabolic Panel: Recent Labs  Lab 05/09/23 1014 05/10/23 0140 05/11/23 1039 05/12/23 0140 05/13/23 0159  NA 121* 126* 134* 132* 129*  K 4.9 3.9  5.5* 4.1 4.2  CL 91* 99 105 103 101  CO2 22 18* 14* 21* 17*  GLUCOSE 385* 217* 182* 170* 305*  BUN 26* 23 24* 21 30*  CREATININE 1.73* 1.60* 1.64* 1.63* 1.79*  CALCIUM 9.6 8.8* 9.1 8.9 8.6*  MG  --   --   --  1.7 1.6*    GFR: Estimated Creatinine Clearance: 44.4 mL/min (A) (by C-G formula based on SCr of 1.79 mg/dL (H)).  Liver Function Tests: Recent Labs  Lab 05/09/23 1014  AST 28  ALT 22  ALKPHOS 89  BILITOT 0.7  PROT 7.1  ALBUMIN 4.1    CBG: Recent Labs  Lab 05/12/23 1338 05/12/23 1630 05/12/23 2034 05/13/23 0746 05/13/23 1128  GLUCAP 153* 182* 285* 251* 318*     Recent Results (from the past 240 hour(s))  Blood Cultures x 2 sites     Status: None (Preliminary result)   Collection Time: 05/09/23  4:52 PM   Specimen: BLOOD  Result Value Ref Range Status   Specimen Description BLOOD BLOOD RIGHT HAND  Final   Special Requests   Final    BOTTLES DRAWN AEROBIC AND ANAEROBIC Blood Culture adequate volume   Culture   Final    NO GROWTH 4 DAYS Performed at [facility], [address] [place], [place] [address]    Report Status PENDING  Incomplete  Blood Cultures x 2 sites     Status: None (Preliminary result)   Collection Time: 05/09/23  4:52 PM   Specimen: BLOOD  Result Value Ref Range Status   Specimen Description BLOOD BLOOD LEFT ARM  Final   Special Requests   Final    BOTTLES DRAWN AEROBIC AND ANAEROBIC Blood Culture adequate volume   Culture   Final    NO GROWTH 4 DAYS Performed at [facility], [address], [place], [place] [address]    Report Status PENDING  Incomplete  MRSA Next Gen by PCR, Nasal     Status: None   Collection Time: 05/11/23  6:21 PM   Specimen: Nasal Mucosa; Nasal Swab  Result Value Ref Range Status   MRSA by PCR Next Gen NOT DETECTED NOT DETECTED Final    Comment: (NOTE) The GeneXpert MRSA Assay (FDA approved for NASAL specimens only), is one component of a comprehensive MRSA colonization  surveillance program. It is not intended to diagnose MRSA infection nor to guide or monitor treatment for MRSA infections. Test performance is not FDA approved in patients less than 72 years old. Performed at [facility], [address], [place], [place] [address]          Radiology Studies: No results found.      Scheduled Meds:  amitriptyline  25 mg Oral QHS   amLODipine  10 mg Oral Daily   aspirin EC  81 mg  Oral Daily   atorvastatin  80 mg Oral Daily   brinzolamide  1 drop Both Eyes TID   And   brimonidine  1 drop Both Eyes TID   carvedilol  12.5 mg Oral BID WC   docusate sodium  100 mg Oral BID   enoxaparin (LOVENOX) injection  40 mg Subcutaneous Q24H   feeding supplement (GLUCERNA SHAKE)  237 mL Oral BID BM   folic acid  1 mg Oral Daily   gabapentin  300 mg Oral QPM   hydrALAZINE  100 mg Oral BID   hydrOXYzine  25 mg Oral Q0600   insulin aspart  0-5 Units Subcutaneous QHS   insulin aspart  0-9 Units Subcutaneous TID WC   insulin glargine-yfgn  25 Units Subcutaneous Daily   latanoprost  1 drop Both Eyes QHS   mometasone-formoterol  2 puff Inhalation BID   multivitamin with minerals  1 tablet Oral Daily   pantoprazole  40 mg Oral BID AC   polyethylene glycol  17 g Oral BID   senna-docusate  1 tablet Oral BID   thiamine  100 mg Oral Daily   Or   thiamine  100 mg Intravenous Daily   traZODone  75 mg Oral QHS   Continuous Infusions:  sodium chloride 100 mL/hr at 05/13/23 1013   cefTRIAXone (ROCEPHIN)  IV 2 g (05/13/23 1219)     LOS: 4 days    Time spent: 40 mins    Ramiro Harvest, MD [facility]   To contact the attending provider between 7A-7P or the covering provider during after hours 7P-7A, please log into the web site [email or web address] and access using universal [place] password for that web site. If you do not have the password, please call the hospital operator.  05/13/2023, 12:39 PM

## 2023-05-13 NOTE — Plan of Care (Signed)

## 2023-05-14 ENCOUNTER — Inpatient Hospital Stay (HOSPITAL_COMMUNITY): Payer: No Typology Code available for payment source

## 2023-05-14 DIAGNOSIS — H409 Unspecified glaucoma: Secondary | ICD-10-CM | POA: Diagnosis not present

## 2023-05-14 DIAGNOSIS — I2510 Atherosclerotic heart disease of native coronary artery without angina pectoris: Secondary | ICD-10-CM | POA: Diagnosis not present

## 2023-05-14 DIAGNOSIS — K8689 Other specified diseases of pancreas: Secondary | ICD-10-CM | POA: Diagnosis not present

## 2023-05-14 DIAGNOSIS — I739 Peripheral vascular disease, unspecified: Secondary | ICD-10-CM | POA: Diagnosis not present

## 2023-05-14 LAB — CBC WITH DIFFERENTIAL/PLATELET
Abs Immature Granulocytes: 0.05 10*3/uL (ref 0.00–0.07)
Basophils Absolute: 0 10*3/uL (ref 0.0–0.1)
Basophils Relative: 0 %
Eosinophils Absolute: 0 10*3/uL (ref 0.0–0.5)
Eosinophils Relative: 0 %
HCT: 37.2 % — ABNORMAL LOW (ref 39.0–52.0)
Hemoglobin: 13.5 g/dL (ref 13.0–17.0)
Immature Granulocytes: 0 %
Lymphocytes Relative: 13 %
Lymphs Abs: 1.8 10*3/uL (ref 0.7–4.0)
MCH: 27.5 pg (ref 26.0–34.0)
MCHC: 36.3 g/dL — ABNORMAL HIGH (ref 30.0–36.0)
MCV: 75.8 fL — ABNORMAL LOW (ref 80.0–100.0)
Monocytes Absolute: 1.2 10*3/uL — ABNORMAL HIGH (ref 0.1–1.0)
Monocytes Relative: 9 %
Neutro Abs: 10.2 10*3/uL — ABNORMAL HIGH (ref 1.7–7.7)
Neutrophils Relative %: 78 %
Platelets: 259 10*3/uL (ref 150–400)
RBC: 4.91 MIL/uL (ref 4.22–5.81)
RDW: 13.2 % (ref 11.5–15.5)
WBC: 13.3 10*3/uL — ABNORMAL HIGH (ref 4.0–10.5)
nRBC: 0 % (ref 0.0–0.2)

## 2023-05-14 LAB — GLUCOSE, CAPILLARY
Glucose-Capillary: 225 mg/dL — ABNORMAL HIGH (ref 70–99)
Glucose-Capillary: 236 mg/dL — ABNORMAL HIGH (ref 70–99)
Glucose-Capillary: 113 mg/dL — ABNORMAL HIGH (ref 70–99)
Glucose-Capillary: 269 mg/dL — ABNORMAL HIGH (ref 70–99)

## 2023-05-14 LAB — BASIC METABOLIC PANEL
Anion gap: 11 (ref 5–15)
BUN: 25 mg/dL — ABNORMAL HIGH (ref 8–23)
CO2: 19 mmol/L — ABNORMAL LOW (ref 22–32)
Calcium: 9.1 mg/dL (ref 8.9–10.3)
Chloride: 100 mmol/L (ref 98–111)
Creatinine, Ser: 1.39 mg/dL — ABNORMAL HIGH (ref 0.61–1.24)
GFR, Estimated: 57 mL/min — ABNORMAL LOW (ref >60)
Glucose, Bld: 219 mg/dL — ABNORMAL HIGH (ref 70–99)
Potassium: 3.7 mmol/L (ref 3.5–5.1)
Sodium: 130 mmol/L — ABNORMAL LOW (ref 135–145)

## 2023-05-14 LAB — CULTURE, BLOOD (ROUTINE X 2): Culture: NO GROWTH

## 2023-05-14 LAB — MAGNESIUM: Magnesium: 2.3 mg/dL (ref 1.7–2.4)

## 2023-05-14 MED ORDER — ONDANSETRON HCL 4 MG/2ML IJ SOLN: 4.0000 mg | Freq: Four times a day (QID) | INTRAMUSCULAR | Status: DC | PRN

## 2023-05-14 MED ORDER — ONDANSETRON HCL 4 MG/2ML IJ SOLN
4.0000 mg | Freq: Four times a day (QID) | INTRAMUSCULAR | Status: DC | PRN
Start: 2023-05-14 — End: 2023-05-14

## 2023-05-14 MED ORDER — OXYCODONE HCL 5 MG PO TABS
10.0000 mg | ORAL_TABLET | ORAL | Status: DC | PRN
  Administered 2023-05-15 – 2023-05-18 (×16): 10 mg via ORAL
  Filled 2023-05-14 (×17): qty 2

## 2023-05-14 MED ORDER — DIPHENHYDRAMINE HCL 50 MG/ML IJ SOLN: 12.5000 mg | Freq: Four times a day (QID) | INTRAMUSCULAR | Status: DC | PRN

## 2023-05-14 MED ORDER — HYDROMORPHONE HCL 1 MG/ML IJ SOLN
1.0000 mg | Freq: Once | INTRAMUSCULAR | Status: AC
  Administered 2023-05-14: 1 mg via INTRAVENOUS
  Filled 2023-05-14: qty 1

## 2023-05-14 MED ORDER — HYDRALAZINE HCL 50 MG PO TABS
100.0000 mg | ORAL_TABLET | Freq: Three times a day (TID) | ORAL | Status: DC
  Administered 2023-05-14 – 2023-05-18 (×13): 100 mg via ORAL
  Filled 2023-05-14 (×13): qty 2

## 2023-05-14 MED ORDER — METHOCARBAMOL 500 MG PO TABS
500.0000 mg | ORAL_TABLET | Freq: Four times a day (QID) | ORAL | Status: DC | PRN
  Administered 2023-05-14 – 2023-05-18 (×7): 500 mg via ORAL
  Filled 2023-05-14 (×7): qty 1

## 2023-05-14 MED ORDER — FENTANYL CITRATE PF 50 MCG/ML IJ SOSY
50.0000 ug | PREFILLED_SYRINGE | Freq: Once | INTRAMUSCULAR | Status: AC
  Administered 2023-05-14: 50 ug via INTRAVENOUS
  Filled 2023-05-14: qty 1

## 2023-05-14 MED ORDER — HYDROMORPHONE 1 MG/ML IV SOLN
INTRAVENOUS | Status: DC
  Administered 2023-05-14 (×2): 30 mg via INTRAVENOUS
  Filled 2023-05-14: qty 30

## 2023-05-14 MED ORDER — INSULIN GLARGINE-YFGN 100 UNIT/ML ~~LOC~~ SOLN: 5.0000 [IU] | Freq: Once | SUBCUTANEOUS | Status: DC

## 2023-05-14 MED ORDER — INSULIN GLARGINE-YFGN 100 UNIT/ML ~~LOC~~ SOLN
35.0000 [IU] | Freq: Every day | SUBCUTANEOUS | Status: DC
  Administered 2023-05-15 – 2023-05-18 (×4): 35 [IU] via SUBCUTANEOUS
  Filled 2023-05-14 (×4): qty 0.35

## 2023-05-14 MED ORDER — HYDROMORPHONE HCL 1 MG/ML IJ SOLN
1.0000 mg | INTRAMUSCULAR | Status: DC | PRN
  Administered 2023-05-15 – 2023-05-18 (×7): 1 mg via INTRAVENOUS
  Filled 2023-05-14 (×7): qty 1

## 2023-05-14 MED ORDER — SODIUM CHLORIDE 0.9% FLUSH: 9.0000 mL | INTRAVENOUS | Status: DC | PRN

## 2023-05-14 MED ORDER — DIPHENHYDRAMINE HCL 12.5 MG/5ML PO ELIX: 12.5000 mg | ORAL_SOLUTION | Freq: Four times a day (QID) | ORAL | Status: DC | PRN

## 2023-05-14 MED ORDER — NALOXONE HCL 0.4 MG/ML IJ SOLN: 0.4000 mg | INTRAMUSCULAR | Status: DC | PRN

## 2023-05-14 MED ORDER — OXYCODONE HCL 5 MG PO TABS
5.0000 mg | ORAL_TABLET | ORAL | Status: DC | PRN
  Administered 2023-05-14: 10 mg via ORAL
  Filled 2023-05-14: qty 2

## 2023-05-14 MED ORDER — ONDANSETRON HCL 4 MG PO TABS: 4.0000 mg | ORAL_TABLET | Freq: Four times a day (QID) | ORAL | Status: DC | PRN

## 2023-05-14 NOTE — Progress Notes (Signed)
NCM informed Luvenia Redden transfer coordinator April of pt's current hospital stay. Pt is active with [facility] clinic. Dr. Dennard Schaumann, [place] [phone], ext. [phone]. Gae Gallop RN, [place] [phone]

## 2023-05-14 NOTE — Progress Notes (Signed)
  VASCULAR SURGERY ASSESSMENT & PLAN:   POD 2 RIGHT TMA: I change the dressing.  So far the incision looks good.  There is no bleeding today.  Begin daily dressing changes.  His pain control has been an issue so I have started a Dilaudid PCA.  SUBJECTIVE:   Having significant incisional pain.  PHYSICAL EXAM:   Vitals:   05/13/23 1628 05/13/23 1928 05/14/23 0435 05/14/23 0756  BP: 127/68 (!) 198/89 (!) 166/92 (!) 176/93  Pulse: 72 75 69 71  Resp: 16 20 20    Temp: 98.8 F (37.1 C) 97.8 F (36.6 C) 98.1 F (36.7 C) 98.7 F (37.1 C)  TempSrc: Oral Oral Oral Oral  SpO2: 100% 100% 98% 97%  Weight:       Change his dressing and his right TMA looks good.   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) Active Problems:   Hypertension   Hyponatremia   Type 2 diabetes mellitus with complication, with long-term current use of insulin (HCC)   Hyperkalemia   Dyslipidemia associated with type 2 diabetes mellitus (HCC)   CKD (chronic kidney disease) stage 3, GFR 30-59 ml/min   COPD (chronic obstructive pulmonary disease) (HCC)   CAD (coronary artery disease)   Pancreatic mass   Constipation   Glaucoma   Metabolic acidosis   CURRENT MEDS:    amitriptyline  25 mg Oral QHS   amLODipine  10 mg Oral Daily   aspirin EC  81 mg Oral Daily   atorvastatin  80 mg Oral Daily   brinzolamide  1 drop Both Eyes TID   And   brimonidine  1 drop Both Eyes TID   carvedilol  12.5 mg Oral BID WC   docusate sodium  100 mg Oral BID   enoxaparin  (LOVENOX) injection  40 mg Subcutaneous Q24H   feeding supplement (GLUCERNA SHAKE)  237 mL Oral BID BM   folic acid  1 mg Oral Daily   gabapentin  300 mg Oral QPM   hydrALAZINE  100 mg Oral BID   hydrOXYzine  25 mg Oral Q0600   insulin aspart  0-5 Units Subcutaneous QHS   insulin aspart  0-9 Units Subcutaneous TID WC   insulin glargine-yfgn  30 Units Subcutaneous Daily   latanoprost  1 drop Both Eyes QHS   mometasone-formoterol  2 puff Inhalation BID   multivitamin with minerals  1 tablet Oral Daily   pantoprazole  40 mg Oral BID AC   polyethylene glycol  17 g Oral BID   senna-docusate  1 tablet Oral BID   thiamine  100 mg Oral Daily   Or   thiamine  100 mg Intravenous Daily   traZODone  75 mg Oral QHS    Waverly Ferrari Office: [phone] 05/14/2023

## 2023-05-14 NOTE — Progress Notes (Signed)
Handoff of PCA verified with Cala Bradford, RN

## 2023-05-14 NOTE — Progress Notes (Signed)
Patient declines PCA medication at this time , requesting to resume oral prn medication for pain management.

## 2023-05-14 NOTE — Progress Notes (Signed)
PT Cancellation Note  Patient Details Name: Jose [name] MRN: [id number] DOB: December 26, 1960   Cancelled Treatment:    Reason Eval/Treat Not Completed: Patient declined. States he won't work with therapy today. Had a bad morning. Reports pain not controlled. Was given Fentanyl prior to my arrival but declines to participate this date. Discussed importance of mobility to prevent secondary complications of immobility.   Will continue to follow and progress as tolerated/willing.  Kathlyn Sacramento, PT, DPT [facility]  Rehabilitation Services Physical Therapist Office: [phone] Website: [email or web address]  Berton Mount 05/14/2023, 12:53 PM

## 2023-05-14 NOTE — Evaluation (Signed)
Occupational Therapy Evaluation Patient Details Name: Jose [name] MRN: [id number] DOB: 10-Aug-1961 Today's Date: 05/14/2023   History of Present Illness 62 y.o. male admitted 6/15 with ulceration of the right foot. Underwent TRANSMETATARSAL AMPUTATION 6/18. 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 medical history significant of CAD status post stenting x 2, CKD stage II, COPD, IDDM, HTN.   Clinical Impression   Pt admitted for above dx, PTA patient lived alone but may be able to stay with family upon DC, he reports being ind in ambulation and ADLs PTA. Pt currently presenting with LLE pain, he needed max encouragement to get OOB to recliner today, he demonstrates a good carryover of LLE precautions, he seems to prefer being NWB when ambulating. Pt is completing OOB mobility with supervision and bADLs with supervision to Mod I. He may have a tub shower upon DC and would benefit from some trials with tub transfers, planned for next session when patient is receptive to more mobility. Pt would benefit from continued acute skilled OT services to address above deficits and help transition to next level of care. No follow-up OT recommended at this time.      Recommendations for follow up therapy are one component of a multi-disciplinary discharge planning process, led by the attending physician.  Recommendations may be updated based on patient status, additional functional criteria and insurance authorization.   Assistance Recommended at Discharge Set up Supervision/Assistance  Patient can return home with the following A little help with bathing/dressing/bathroom;Assistance with cooking/housework    Functional Status Assessment  Patient has had a recent decline in their functional status and demonstrates the ability to make significant improvements in function in a reasonable and predictable amount of time.  Equipment Recommendations  Tub/shower  bench;Tub/shower seat (Plan to review tub transfers with patient next OT session to assess DME need/preference upon DC)    Recommendations for Other Services       Precautions / Restrictions Precautions Precautions: Fall Required Braces or Orthoses: Other Brace Other Brace: Darco shoe Restrictions Weight Bearing Restrictions: No RLE Weight Bearing: Partial weight bearing RLE Partial Weight Bearing Percentage or Pounds: Through heel only with Darco shoe      Mobility Bed Mobility Overal bed mobility: Modified Independent                  Transfers Overall transfer level: Needs assistance Equipment used: Rolling walker (2 wheels) Transfers: Sit to/from Stand Sit to Stand: Supervision                  Balance Overall balance assessment: Needs assistance Sitting-balance support: Feet supported Sitting balance-Leahy Scale: Normal     Standing balance support: Bilateral upper extremity supported, During functional activity Standing balance-Leahy Scale: Poor                             ADL either performed or assessed with clinical judgement   ADL Overall ADL's : Needs assistance/impaired Eating/Feeding: Independent;Sitting   Grooming: Sitting;Set up   Upper Body Bathing: Sitting;Independent   Lower Body Bathing: Sitting/lateral leans;Supervison/ safety;Set up   Upper Body Dressing : Sitting;Independent   Lower Body Dressing: Sitting/lateral leans;Supervision/safety Lower Body Dressing Details (indicate cue type and reason): pt donned LLE drace shoe with 1 verbal cue for strap accuracy, donned sock with supervision Toilet Transfer: Ambulation;Supervision/safety;Rolling walker (2 wheels)   Toileting- Clothing Manipulation and Hygiene: Supervision/safety;Sit to/from stand  Tub/Shower Transfer Details (indicate cue type and reason): plan to attempt tub transfers next OT sessions Functional mobility during ADLs: Supervision/safety;Rolling  walker (2 wheels)       Vision         Perception     Praxis      Pertinent Vitals/Pain Pain Assessment Pain Assessment: Faces Faces Pain Scale: Hurts even more Pain Location: rt foot Pain Descriptors / Indicators: Aching, Burning Pain Intervention(s): Monitored during session, Repositioned, Patient requesting pain meds-RN notified     Hand Dominance Right   Extremity/Trunk Assessment Upper Extremity Assessment Upper Extremity Assessment: Overall WFL for tasks assessed   Lower Extremity Assessment RLE Deficits / Details: Rt foot bandaged RLE: Unable to fully assess due to pain;Unable to fully assess due to immobilization       Communication Communication Communication: No difficulties   Cognition Arousal/Alertness: Awake/alert Behavior During Therapy: WFL for tasks assessed/performed Overall Cognitive Status: Within Functional Limits for tasks assessed                                 General Comments: Pt initally refusing therapeutic intervention but receptive to getting into bedside recliner     General Comments  Pt just waking up upon OT entry, noted to be groggy. Initially resistive to therapy. RN in room setting up IV medications    Exercises     Shoulder Instructions      Home Living Family/patient expects to be discharged to:: Private residence Living Arrangements: Alone (Going to stay with family for a while.) Available Help at Discharge: Friend(s);Available PRN/intermittently Type of Home: House Home Access: Level entry     Home Layout: One level     Bathroom Shower/Tub: Chief Strategy Officer: Standard     Home Equipment: Cane - single point          Prior Functioning/Environment Prior Level of Function : Independent/Modified Independent             Mobility Comments: SPC to ambulate ADLs Comments: ind        OT Problem List: Impaired balance (sitting and/or standing);Decreased activity  tolerance;Pain      OT Treatment/Interventions: Self-care/ADL training;Balance training;Therapeutic activities;Patient/family education    OT Goals(Current goals can be found in the care plan section) Acute Rehab OT Goals Patient Stated Goal: To go home OT Goal Formulation: With patient Time For Goal Achievement: 05/28/23 Potential to Achieve Goals: Good ADL Goals Pt Will Perform Grooming: with supervision;standing Pt Will Perform Lower Body Bathing: with modified independence;sitting/lateral leans Pt Will Transfer to Toilet: with modified independence;ambulating Pt Will Perform Tub/Shower Transfer: with supervision;tub bench;shower seat;ambulating  OT Frequency: Min 2X/week    Co-evaluation              AM-PAC OT "6 Clicks" Daily Activity     Outcome Measure Help from another person eating meals?: None Help from another person taking care of personal grooming?: A Little Help from another person toileting, which includes using toliet, bedpan, or urinal?: A Little Help from another person bathing (including washing, rinsing, drying)?: A Little Help from another person to put on and taking off regular upper body clothing?: None Help from another person to put on and taking off regular lower body clothing?: A Little 6 Click Score: 20   End of Session Equipment Utilized During Treatment: Gait belt;Rolling walker (2 wheels) Nurse Communication: Mobility status  Activity Tolerance: Patient tolerated treatment well  Patient left: in chair;with call bell/phone within reach;with chair alarm set;with nursing/sitter in room  OT Visit Diagnosis: Unsteadiness on feet (R26.81);Pain Pain - Right/Left: Left Pain - part of body: Ankle and joints of foot                Time: 1610-9604 OT Time Calculation (min): 17 min Charges:  OT General Charges $OT Visit: 1 Visit OT Evaluation $OT Eval Moderate Complexity: 1 Mod  05/14/2023  AB, OTR/L  Acute Rehabilitation Services  Office:  [phone]   [name] 05/14/2023, 3:47 PM

## 2023-05-14 NOTE — Progress Notes (Signed)
PROGRESS NOTE    Jose [name]  GUY:[id number] DOB: 1961-08-16 DOA: 05/09/2023 PCP: [name]    Chief Complaint  Patient presents with   Necrotic Toes Rt Foot    Brief Narrative:  62yo male with h/o remote polysubstance abuse (ETOH, cocaine), PVD, CAD s/p stents, stage 3 CKD, COPD, DM, HTN, and HLD who presented on 6/15 for necrotic foot.  He was recently admitted from 5/31-6/6 for dry gangrene and underwent lithotripsy of the R popliteal artery and angioplasty, plan to follow up with vascular surgery in a month to allow necrotic foot to demarcate. During that visit he was also noted to have an incidental pancreatic tail mass that was suspicion for pancreatic CA and was recommended to have outpatient MRCP. Pain and discoloration were worsening so he returned to the ER. Evaluation appears to show progressive necrosis despite apparently intact circulation.  Vascular surgery is consulting and ordered ABIs (B noncompressible LE arteries with abnormal B toe-brachial indices) and RLE arterial duplex (calcified but patent vessels throughout with flow noted).  Status post R TMA planned 6/18.     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 -During last hospitalization patient underwent successful revascularization. -Patient presented with progressive necrosis, surrounding  erythema possible cellulitis. -Patient seen by vascular surgery, noted to be at risk for poor healing and vascular surgery recommended TMA which patient underwent 05/12/2023. -Patient with normal lactate, CRP, sed rate, prealbumin. -Blood cultures with no growth x 5 days. -Plavix on hold. -IV Rocephin to be completed today. -Continue aspirin, gabapentin, oxycodone, Dilaudid for pain control. -Patient noted to have uncontrolled pain overnight and this morning, assessed by vascular surgery and decision made to place patient on Dilaudid PCA pump per vascular surgery. -Patient underwent dressing changes today per vascular surgery and is partial weightbearing to the right foot and heel only. -Per vascular surgery.  2.  Incidental finding of pancreatic tail lesion/mass -Noted during last hospitalization concerning for pancreatic cancer. -CA 19-9 was 13. -Patient is scheduled for outpatient follow-up with [facility] and for outpatient MRCP. -It is noted that patient had a telephone visit 611 appears to have been referred for MRCP although this does not appear to have been done yet. -Per Dr. Ophelia Charter may need to consider Trousseau syndrome for his ischemic toes (migratory thrombophlebitis associated undiagnosed malignancy), however ischemic toes likely related to chronic uncontrolled diabetes and hypertension. -MRCP ordered which showed multiseptated complex cystic lesion of the tip of the pancreatic tail measuring 3.4 x 2.1 cm unchanged in comparison to prior exams dating back to 04/27/2019.  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  No pancreatic ductal dilatation or surrounding inflammatory changes noted.  Consider additional follow-up in 1 to 2 years or alternatively EUS/FNA for definitive tissue diagnosis.  -Outpatient follow-up.   3.  CKD stage IIIb -Stable.  4.  Acute on chronic hyponatremia -Baseline sodium  128-130. -Saline lock IV fluids.  5.  Hypomagnesemia -Repleted.    -Magnesium at 2.3.  6.  Diabetes mellitus type 2 -Hemoglobin A1c 7.7 (04/24/2023) -CBG 225 this morning.   -Increase Semglee to 35 units daily.  -SSI.  7.  Hypertension -Continue Norvasc 10 mg daily, Coreg 12.5 mg daily.  Increase hydralazine to 100 mg 3 times daily for better blood pressure control.  IV hydralazine as needed.   - Component of increased blood pressure could be secondary to uncontrolled pain.   8.  Hyperkalemia/metabolic acidosis -Not receiving iatrogenic potassium. -Patient noted with worsening acidosis in the setting of foot necrosis, no anion gap currently. -Hyperkalemia resolved.  -Metabolic acidosis slowly improving, however with CKD may need oral bicarb supplementation however will monitor for now.   9.  Hyperlipidemia -Continue statin.   10.  COPD -Stable. -Continue Dulera, albuterol.  11.  Constipation -Continue current regimen of MiraLAX and Senokot-S.   12.  CAD -Stable. -Continue aspirin.   -Plavix on hold, vascular surgery to advise when Plavix may be resumed.  13.  Glaucoma -Continue brimonidine, brinzolamide, latanoprost.  14.  Insomnia -Patient noted to have reportedly finally slept for the first time 6/17 as pain was better controlled. 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 -Noted not to like trazodone effects. -Patient started on amitriptyline at bedtime on 6/17 to hopefully also help with his neuropathy.   DVT prophylaxis: Lovenox Code Status: Full Family Communication: Updated patient.  No family at bedside. Disposition: TBD  Status is: Inpatient Remains inpatient appropriate because: Severity of illness.   Consultants:  Vascular surgery :Dr Lenell Antu 05/09/2023  Procedures:  ABI 05/09/2023 Right Transmetatarsal amputation 05/12/2023 Per vascular surgery: Dr Edilia Bo 05/12/2023 MRI abdomen 05/14/2023  Antimicrobials:  Anti-infectives (From admission, onward)    Start     Dose/Rate Route Frequency  Ordered Stop   05/10/23 1300  vancomycin (VANCOREADY) IVPB 1250 mg/250 mL  Status:  Discontinued        1,250 mg 166.7 mL/hr over 90 Minutes Intravenous Every 24 hours 05/09/23 1250 05/13/23 1013   05/09/23 1300  cefTRIAXone (ROCEPHIN) 2 g in sodium chloride 0.9 % 100 mL IVPB        2 g 200 mL/hr over 30 Minutes Intravenous Every 24 hours 05/09/23 1245 05/14/23 2359   05/09/23 1300  metroNIDAZOLE (FLAGYL) IVPB 500 mg  Status:  Discontinued        500 mg 100 mL/hr over 60 Minutes Intravenous Every 8 hours 05/09/23 1245 05/13/23 1013   05/09/23 1300  vancomycin (VANCOREADY) IVPB 1750 mg/350 mL        1,750 mg 175 mL/hr over 120 Minutes Intravenous  Once 05/09/23 1250 05/09/23 2044         Subjective: Sitting up in bed.  Complains of significant pain in his right foot yesterday after trying to work with therapy.  Had significant pain overnight and this morning.  States able to work with therapy if pain is better controlled.   .  Objective: Vitals:   05/13/23 1628 05/13/23 1928 05/14/23 0435 05/14/23 0756  BP: 127/68 (!) 198/89 (!) 166/92 (!) 176/93  Pulse: 72 75 69 71  Resp: 16 20 20    Temp: 98.8 F (37.1 C) 97.8 F (36.6 C) 98.1 F (36.7 C) 98.7 F (37.1 C)  TempSrc: Oral Oral Oral Oral  SpO2: 100% 100% 98% 97%  Weight:        Intake/Output Summary (Last 24 hours) at 05/14/2023 1257 Last data  filed at 05/14/2023 0435 Gross per 24 hour  Intake --  Output 1350 ml  Net -1350 ml    Filed Weights   05/09/23 1200  Weight: 81.6 kg    Examination:  General exam: NAD. Respiratory system: CTAB.  No wheezes, no crackles, no rhonchi.  Fair air movement.  Speaking in full sentences.  Cardiovascular system: Regular rate and rhythm no murmurs rubs or gallops.  No JVD.  No lower extremity edema.  Gastrointestinal system: Abdomen is soft, nontender, nondistended, positive bowel sounds.  No rebound.  No guarding.   Central nervous system: Alert and oriented. No focal neurological  deficits. Extremities: Patient.  Status post right foot TMA in postop bandage.  Skin: No rashes, lesions or ulcers Psychiatry: Judgement and insight appear normal. Mood & affect appropriate.     Data Reviewed: I have personally reviewed following labs and imaging studies  CBC: Recent Labs  Lab 05/09/23 1014 05/10/23 0140 05/11/23 1039 05/12/23 0140 05/13/23 0159 05/14/23 0120  WBC 9.6 7.9 7.9 7.4 8.0 13.3*  NEUTROABS 7.7  --  4.2 4.1 6.3 10.2*  HGB 14.2 11.4* 11.9* 11.5* 11.8* 13.5  HCT 38.7* 31.1* 32.1* 32.3* 32.3* 37.2*  MCV 76.9* 75.3* 77.7* 76.9* 77.3* 75.8*  PLT 276 225 PLATELET CLUMPS NOTED ON SMEAR, UNABLE TO ESTIMATE 224 209 259     Basic Metabolic Panel: Recent Labs  Lab 05/10/23 0140 05/11/23 1039 05/12/23 0140 05/13/23 0159 05/14/23 0120  NA 126* 134* 132* 129* 130*  K 3.9 5.5* 4.1 4.2 3.7  CL 99 105 103 101 100  CO2 18* 14* 21* 17* 19*  GLUCOSE 217* 182* 170* 305* 219*  BUN 23 24* 21 30* 25*  CREATININE 1.60* 1.64* 1.63* 1.79* 1.39*  CALCIUM 8.8* 9.1 8.9 8.6* 9.1  MG  --   --  1.7 1.6* 2.3     GFR: Estimated Creatinine Clearance: 57.2 mL/min (A) (by C-G formula based on SCr of 1.39 mg/dL (H)).  Liver Function Tests: Recent Labs  Lab 05/09/23 1014  AST 28  ALT 22  ALKPHOS 89  BILITOT 0.7  PROT 7.1  ALBUMIN 4.1     CBG: Recent Labs  Lab 05/13/23 1128 05/13/23 1627 05/13/23 2127 05/14/23 0757 05/14/23 1132  GLUCAP 318* 222* 252* 225* 236*      Recent Results (from the past 240 hour(s))  Blood Cultures x 2 sites     Status: None   Collection Time: 05/09/23  4:52 PM   Specimen: BLOOD  Result Value Ref Range Status   Specimen Description BLOOD BLOOD RIGHT HAND  Final   Special Requests   Final    BOTTLES DRAWN AEROBIC AND ANAEROBIC Blood Culture adequate volume   Culture   Final    NO GROWTH 5 DAYS Performed at [facility], [address], [place], [place] [address]    Report Status 05/14/2023 FINAL  Final  Blood  Cultures x 2 sites     Status: None   Collection Time: 05/09/23  4:52 PM   Specimen: BLOOD  Result Value Ref Range Status   Specimen Description BLOOD BLOOD LEFT ARM  Final   Special Requests   Final    BOTTLES DRAWN AEROBIC AND ANAEROBIC Blood Culture adequate volume   Culture   Final    NO GROWTH 5 DAYS Performed at [facility], [address] [place], [place] [address]    Report Status 05/14/2023 FINAL  Final  MRSA Next Gen by PCR, Nasal  Status: None   Collection Time: 05/11/23  6:21 PM   Specimen: Nasal Mucosa; Nasal Swab  Result Value Ref Range Status   MRSA by PCR Next Gen NOT DETECTED NOT DETECTED Final    Comment: (NOTE) The GeneXpert MRSA Assay (FDA approved for NASAL specimens only), is one component of a comprehensive MRSA colonization surveillance program. It is not intended to diagnose MRSA infection nor to guide or monitor treatment for MRSA infections. Test performance is not FDA approved in patients less than 24 years old. Performed at [facility], [address] [place], [place] [address]          Radiology Studies: MR ABDOMEN WO CONTRAST  Result Date: 05/14/2023 CLINICAL DATA:  Pancreatic tail mass suspected EXAM: MRI ABDOMEN WITHOUT CONTRAST TECHNIQUE: Multiplanar multisequence MR imaging was performed without the administration of intravenous contrast. COMPARISON:  CT angiogram runoff, 04/24/2023 CT abdomen, 04/27/2019 FINDINGS: Examination is limited by lack of intravenous contrast. Lower chest: No acute abnormality.  Cardiomegaly. Hepatobiliary: No obvious solid liver abnormality is seen. No gallstones, gallbladder wall thickening, or biliary dilatation. Pancreas: Multiseptated, complex cystic lesion of the tip of the pancreatic tail measuring 3.4 x 2.1 cm. This is unchanged in comparison to prior examinations dating back to 04/27/2019 (series 3, image 27). No pancreatic ductal dilatation or surrounding inflammatory changes. Spleen: Normal  in size without significant abnormality. Adrenals/Urinary Tract: Adrenal glands are unremarkable. Kidneys are normal, without renal calculi, obvious solid lesion, or hydronephrosis. Stomach/Bowel: Stomach is within normal limits. No evidence of bowel wall thickening, distention, or inflammatory changes. Vascular/Lymphatic: No significant vascular findings are present. No enlarged abdominal lymph nodes. Other: No abdominal wall hernia or abnormality. No ascites. Musculoskeletal: No acute or significant osseous findings. IMPRESSION: 1. Multiseptated, complex cystic lesion of the tip of the pancreatic tail measuring 3.4 x 2.1 cm. This is unchanged in comparison to prior examinations dating back to 04/27/2019. 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 No pancreatic ductal dilatation or surrounding inflammatory changes. Although well-established stability is very reassuring for benign etiology, given size and complexity, consider additional follow-up in 1-2 years, or alternately EUS/FNA for definitive tissue diagnosis. 2. Cardiomegaly. Electronically Signed   By: Jearld Lesch M.D.   On: 05/14/2023 10:11        Scheduled Meds:  amitriptyline  25 mg Oral QHS   amLODipine  10 mg Oral Daily   aspirin EC  81 mg Oral Daily   atorvastatin  80 mg Oral Daily   brinzolamide  1 drop Both Eyes TID   And   brimonidine  1 drop Both Eyes TID   carvedilol  12.5 mg Oral BID WC   docusate sodium  100 mg Oral BID   enoxaparin (LOVENOX) injection  40 mg Subcutaneous Q24H   feeding supplement (GLUCERNA SHAKE)  237 mL Oral BID BM   folic acid  1 mg Oral Daily   gabapentin  300 mg Oral QPM   hydrALAZINE  100 mg Oral TID    HYDROmorphone (DILAUDID) injection  1 mg Intravenous Once   hydrOXYzine  25 mg Oral Q0600   insulin aspart  0-5 Units Subcutaneous QHS   insulin aspart  0-9 Units Subcutaneous TID WC   insulin glargine-yfgn  30 Units Subcutaneous Daily   latanoprost  1 drop  Both Eyes QHS   mometasone-formoterol  2 puff Inhalation BID   multivitamin with minerals  1 tablet Oral Daily   pantoprazole  40 mg Oral  BID AC   polyethylene glycol  17 g Oral BID   senna-docusate  1 tablet Oral BID   thiamine  100 mg Oral Daily   Or   thiamine  100 mg Intravenous Daily   traZODone  75 mg Oral QHS   Continuous Infusions:  cefTRIAXone (ROCEPHIN)  IV 2 g (05/13/23 1219)     LOS: 5 days    Time spent: 40 mins    Ramiro Harvest, MD [facility]   To contact the attending provider between 7A-7P or the covering provider during after hours 7P-7A, please log into the web site [email or web address] and access using universal [place] password for that web site. If you do not have the password, please call the hospital operator.  05/14/2023, 12:57 PM

## 2023-05-14 NOTE — Progress Notes (Signed)
Pt wound had moderate bleeding. Changed dressing. Pt had severe pain at the surgical site throughout the entire night. Dr Anthoney Harada NP was informed and provided new orders. Orders was administered but mentioned the pain still persist. I have consulted Vascular surgical via phone. Will continue to monitor pt.

## 2023-05-14 NOTE — TOC Initial Note (Signed)
Transition of Care [facility]) - Initial/Assessment Note    Patient Details  Name: Jose [name] MRN: [id number] Date of Birth: 04-15-1961  Transition of Care [facility]) CM/SW Contact:    Epifanio Lesches, RN Phone Number: 05/14/2023, 4:14 PM  Clinical Narrative:                    - s/p  R transmetatarsal amputation, 6/18   From home alone. States has friend support.  NCM  with [employer] benefits. NCM received call from [place] [facility], [place]. Jasmine informed NCM they are working on Firefighter services for pt.  Orders noted for [facility] services. Pt without transportation and wouldn't have anyone to get him to an outpt center. Pt agreeable to home health services. Referral made with [facility] and accepted.  DME ( RW/TOC) will be delivered to bedside by [facility] prior to d/c.  [facility] pharmacy is filling RX meds for pt , nurse will need to pick up medications from pharmacy prior to d/c.  Pt states will have a friend pick him up for transportation to home once d/c.  [facility] pharmacy will continue to monitor and assist with needs....  Expected Discharge Plan: Home w Home Health Services Barriers to Discharge: Continued Medical Work up   Patient Goals and CMS Choice     Choice offered to / list presented to : Patient      Expected Discharge Plan and Services   Discharge Planning Services: CM Consult   Living arrangements for the past 2 months: Apartment                           HH Arranged: PT, OT HH Agency: [facility] Date [facility] Agency Contacted: 05/14/23 Time HH Agency Contacted: 1605 Representative spoke with at [facility] Agency: Tresa Endo  Prior Living Arrangements/Services Living arrangements for the past 2 months: Apartment Lives with:: Self Patient language and need for interpreter reviewed:: Yes        Need for Family Participation in Patient Care: Yes (Comment) Care giver support system in place?: Yes (comment)   Criminal Activity/Legal Involvement Pertinent to  Current Situation/Hospitalization: No - Comment as needed  Activities of Daily Living Home Assistive Devices/Equipment: Cane (specify quad or straight), CBG Meter, Blood pressure cuff, Eyeglasses, Hearing aid (straight) ADL Screening (condition at time of admission) Patient's cognitive ability adequate to safely complete daily activities?: Yes Is the patient deaf or have difficulty hearing?: No Does the patient have difficulty seeing, even when wearing glasses/contacts?: No Does the patient have difficulty concentrating, remembering, or making decisions?: No Patient able to express need for assistance with ADLs?: No Does the patient have difficulty dressing or bathing?: No Independently performs ADLs?: Yes (appropriate for developmental age) Communication: Independent Dressing (OT): Independent Grooming: Independent Feeding: Independent Bathing: Independent Toileting: Independent In/Out Bed: Independent Walks in Home: Independent Does the patient have difficulty walking or climbing stairs?: Yes Weakness of Legs: Right Weakness of Arms/Hands: None  Permission Sought/Granted                  Emotional Assessment Appearance:: Appears stated age Attitude/Demeanor/Rapport: Engaged Affect (typically observed): Appropriate Orientation: : Oriented to Situation, Oriented to  Time, Oriented to Place, Oriented to Self Alcohol / Substance Use: Not Applicable Psych Involvement: No (comment)  Admission diagnosis:  Dry gangrene (HCC) [I96] 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 [facility]) [I73.9] Patient Active Problem List   Diagnosis Date Noted   Metabolic acidosis  05/11/2023   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) 05/09/2023   Pancreatic mass 05/09/2023   Constipation 05/09/2023   Glaucoma 05/09/2023   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) 04/24/2023   Gangrene of right foot (HCC) 04/24/2023   PTSD (post-traumatic stress disorder) 08/14/2022   Nightmares  associated with chronic post-traumatic stress disorder 08/14/2022   Major depressive disorder, 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/11/2022   Suicidal ideation 08/11/2022   Abnormality of aortic valve 01/29/2022   Bradycardia 01/29/2022   Hypokalemia 01/29/2022   Unstable angina (HCC) 01/27/2022   Hypertensive urgency 02/11/2021   Chest pain 02/11/2021   NSTEMI (non-ST elevated myocardial infarction) (HCC) 08/22/2020   Acute coronary syndrome (HCC) 08/21/2020   Hypertensive emergency 08/21/2020   Depression    Elevated troponin    Nausea and vomiting    Suspected COVID-19 virus infection    Diarrhea 04/27/2019   Hypertensive retinopathy of both eyes 02/01/2019   Chest pain with moderate risk for cardiac etiology 08/30/2017   Chest pain with high risk for cardiac etiology 08/28/2017   CKD (chronic kidney disease) stage 3, GFR 30-59 ml/min 08/28/2017   COPD (chronic obstructive pulmonary disease) (HCC) 08/28/2017   CAD (coronary artery disease) 04/10/2016   Hyperlipidemia 04/10/2016   Benign prostatic hyperplasia 04/10/2016   Diabetes (HCC) 04/10/2016   Hyperglycemia 10/17/2015   Type 2 diabetes mellitus with complication, with long-term current use of insulin (HCC) 10/17/2015   Acute renal failure superimposed on stage 3 chronic kidney disease (HCC) 10/17/2015   Hyperkalemia 10/17/2015   Dyslipidemia associated with type 2 diabetes mellitus (HCC) 10/17/2015   Alcohol abuse    Cocaine abuse (HCC) 06/24/2015   Hyponatremia 05/12/2015   Hypertension    PCP:  [name] Pharmacy:   [facility] - [place], [place] - [address] [place] [address] Phone: [phone] Fax: [phone]  [facility] [phone] - [place], [place] - [address] AT [facility] [address] [place] [address] Phone: [phone] Fax:  [phone]     Social Determinants of Health (SDOH) Social History: SDOH Screenings   Food Insecurity: No Food Insecurity (05/09/2023)  Housing: Low Risk  (05/09/2023)  Transportation Needs: No Transportation Needs (05/09/2023)  Utilities: Not At Risk (05/09/2023)  Depression (PHQ2-9): High Risk (08/11/2022)  Tobacco Use: Medium Risk (05/13/2023)   SDOH Interventions:     Readmission Risk Interventions    04/29/2023    1:09 PM 01/29/2022    2:46 PM 08/24/2020    2:21 PM  Readmission Risk Prevention Plan  Transportation Screening Complete Complete Complete  PCP or Specialist Appt within 3-5 Days  Complete Complete  HRI or Home Care Consult  Complete Complete  Social Work Consult for Recovery Care Planning/Counseling  Complete Complete  Palliative Care Screening  Not Applicable Not Applicable  Medication Review Oceanographer) Complete Complete Complete  HRI or Home Care Consult Complete    SW Recovery Care/Counseling Consult Complete    Palliative Care Screening Not Applicable    Skilled Nursing Facility Not Applicable

## 2023-05-15 ENCOUNTER — Other Ambulatory Visit (HOSPITAL_COMMUNITY): Payer: Self-pay

## 2023-05-15 ENCOUNTER — Other Ambulatory Visit: Payer: Self-pay | Admitting: *Deleted

## 2023-05-15 DIAGNOSIS — K8689 Other specified diseases of pancreas: Secondary | ICD-10-CM | POA: Diagnosis not present

## 2023-05-15 DIAGNOSIS — I2510 Atherosclerotic heart disease of native coronary artery without angina pectoris: Secondary | ICD-10-CM | POA: Diagnosis not present

## 2023-05-15 DIAGNOSIS — I739 Peripheral vascular disease, unspecified: Secondary | ICD-10-CM | POA: Diagnosis not present

## 2023-05-15 DIAGNOSIS — M79604 Pain in right leg: Secondary | ICD-10-CM

## 2023-05-15 DIAGNOSIS — R2681 Unsteadiness on feet: Secondary | ICD-10-CM | POA: Diagnosis not present

## 2023-05-15 DIAGNOSIS — H409 Unspecified glaucoma: Secondary | ICD-10-CM | POA: Diagnosis not present

## 2023-05-15 LAB — CBC WITH DIFFERENTIAL/PLATELET
Abs Immature Granulocytes: 0.03 10*3/uL (ref 0.00–0.07)
Basophils Absolute: 0 10*3/uL (ref 0.0–0.1)
Basophils Relative: 0 %
Eosinophils Absolute: 0.1 10*3/uL (ref 0.0–0.5)
Eosinophils Relative: 1 %
HCT: 34.2 % — ABNORMAL LOW (ref 39.0–52.0)
Hemoglobin: 12.2 g/dL — ABNORMAL LOW (ref 13.0–17.0)
Immature Granulocytes: 0 %
Lymphocytes Relative: 23 %
Lymphs Abs: 2.5 10*3/uL (ref 0.7–4.0)
MCH: 27 pg (ref 26.0–34.0)
MCHC: 35.7 g/dL (ref 30.0–36.0)
MCV: 75.7 fL — ABNORMAL LOW (ref 80.0–100.0)
Monocytes Absolute: 1.1 10*3/uL — ABNORMAL HIGH (ref 0.1–1.0)
Monocytes Relative: 10 %
Neutro Abs: 7.4 10*3/uL (ref 1.7–7.7)
Neutrophils Relative %: 66 %
Platelets: 205 10*3/uL (ref 150–400)
RBC: 4.52 MIL/uL (ref 4.22–5.81)
RDW: 13.3 % (ref 11.5–15.5)
WBC: 11.1 10*3/uL — ABNORMAL HIGH (ref 4.0–10.5)
nRBC: 0 % (ref 0.0–0.2)

## 2023-05-15 LAB — BASIC METABOLIC PANEL
Anion gap: 13 (ref 5–15)
BUN: 29 mg/dL — ABNORMAL HIGH (ref 8–23)
CO2: 19 mmol/L — ABNORMAL LOW (ref 22–32)
Calcium: 9.1 mg/dL (ref 8.9–10.3)
Chloride: 98 mmol/L (ref 98–111)
Creatinine, Ser: 1.69 mg/dL — ABNORMAL HIGH (ref 0.61–1.24)
GFR, Estimated: 45 mL/min — ABNORMAL LOW (ref >60)
Glucose, Bld: 184 mg/dL — ABNORMAL HIGH (ref 70–99)
Potassium: 4 mmol/L (ref 3.5–5.1)
Sodium: 130 mmol/L — ABNORMAL LOW (ref 135–145)

## 2023-05-15 LAB — GLUCOSE, CAPILLARY
Glucose-Capillary: 187 mg/dL — ABNORMAL HIGH (ref 70–99)
Glucose-Capillary: 216 mg/dL — ABNORMAL HIGH (ref 70–99)
Glucose-Capillary: 152 mg/dL — ABNORMAL HIGH (ref 70–99)
Glucose-Capillary: 174 mg/dL — ABNORMAL HIGH (ref 70–99)

## 2023-05-15 MED ORDER — FOLIC ACID 1 MG PO TABS: 1.0000 mg | ORAL_TABLET | Freq: Every day | ORAL | Status: AC

## 2023-05-15 MED ORDER — METHOCARBAMOL 500 MG PO TABS
500.0000 mg | ORAL_TABLET | Freq: Four times a day (QID) | ORAL | 0 refills | Status: DC | PRN
  Filled 2023-05-15: qty 20, 5d supply, fill #0

## 2023-05-15 MED ORDER — CLOPIDOGREL BISULFATE 75 MG PO TABS
75.0000 mg | ORAL_TABLET | Freq: Every day | ORAL | Status: DC
  Administered 2023-05-15 – 2023-05-18 (×4): 75 mg via ORAL
  Filled 2023-05-15 (×4): qty 1

## 2023-05-15 MED ORDER — SENNOSIDES-DOCUSATE SODIUM 8.6-50 MG PO TABS
1.0000 | ORAL_TABLET | Freq: Two times a day (BID) | ORAL | 0 refills | Status: DC
  Filled 2023-05-15: qty 28, 14d supply, fill #0

## 2023-05-15 MED ORDER — ONDANSETRON HCL 4 MG PO TABS
4.0000 mg | ORAL_TABLET | Freq: Four times a day (QID) | ORAL | 0 refills | Status: DC | PRN
  Filled 2023-05-15: qty 20, 5d supply, fill #0

## 2023-05-15 MED ORDER — AMOXICILLIN-POT CLAVULANATE 875-125 MG PO TABS
1.0000 | ORAL_TABLET | Freq: Two times a day (BID) | ORAL | Status: DC
  Administered 2023-05-15 – 2023-05-18 (×6): 1 via ORAL
  Filled 2023-05-15 (×6): qty 1

## 2023-05-15 MED ORDER — AMITRIPTYLINE HCL 25 MG PO TABS
25.0000 mg | ORAL_TABLET | Freq: Every day | ORAL | 1 refills | Status: DC
  Filled 2023-05-15: qty 30, 30d supply, fill #0

## 2023-05-15 MED ORDER — HYDROXYZINE HCL 25 MG PO TABS
25.0000 mg | ORAL_TABLET | Freq: Three times a day (TID) | ORAL | 0 refills | Status: DC | PRN
  Filled 2023-05-15: qty 20, 7d supply, fill #0

## 2023-05-15 MED ORDER — "INSULIN SYRINGE-NEEDLE U-100 30G X 1/2"" 0.5 ML MISC"
0 refills | Status: DC
  Filled 2023-05-15: qty 30, 30d supply, fill #0

## 2023-05-15 MED ORDER — INSULIN GLARGINE 100 UNIT/ML ~~LOC~~ SOLN
30.0000 [IU] | Freq: Every day | SUBCUTANEOUS | 0 refills | Status: DC
  Filled 2023-05-15: qty 10, 28d supply, fill #0

## 2023-05-15 MED ORDER — VITAMIN B-1 100 MG PO TABS: 100.0000 mg | ORAL_TABLET | Freq: Every day | ORAL | Status: DC

## 2023-05-15 MED ORDER — OXYCODONE HCL 10 MG PO TABS
10.0000 mg | ORAL_TABLET | ORAL | 0 refills | Status: DC | PRN
  Filled 2023-05-15: qty 20, 4d supply, fill #0

## 2023-05-15 NOTE — Progress Notes (Addendum)
PROGRESS NOTE    Jose [name]  WUJ:[id number] DOB: [birth year] DOA: 05/09/2023 PCP: [name]    Chief Complaint  Patient presents with   Necrotic Toes Rt Foot    Brief Narrative:  62yo male with h/o remote polysubstance abuse (ETOH, cocaine), PVD, CAD s/p stents, stage 3 CKD, COPD, DM, HTN, and HLD who presented on 6/15 for necrotic foot.  He was recently admitted from 5/31-6/6 for dry gangrene and underwent lithotripsy of the R popliteal artery and angioplasty, plan to follow up with vascular surgery in a month to allow necrotic foot to demarcate. During that visit he was also noted to have an incidental pancreatic tail mass that was suspicion for pancreatic CA and was recommended to have outpatient MRCP. Pain and discoloration were worsening so he returned to the ER. Evaluation appears to show progressive necrosis despite apparently intact circulation.  Vascular surgery is consulting and ordered ABIs (B noncompressible LE arteries with abnormal B toe-brachial indices) and RLE arterial duplex (calcified but patent vessels throughout with flow noted).  Status post R TMA planned 6/18.     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 -During last hospitalization patient underwent successful revascularization. -Patient presented with progressive necrosis, surrounding  erythema possible cellulitis. -Patient seen by vascular surgery, noted to be at risk for poor healing and vascular surgery recommended TMA which patient underwent 05/12/2023. -Patient with normal lactate, CRP, sed rate, prealbumin. -Blood cultures with no growth x 5 days. -Plavix on admission and will be resumed today. -Status post 5 days IV Rocephin. -Continue aspirin, gabapentin, oxycodone, Dilaudid for pain control. -Patient noted to have uncontrolled pain the evening of 05/12/2019 4 in the morning of 05/13/2023.   -Patient seen by vascular surgery on Dilaudid PCA pump ordered 05/14/2023, however overnight patient felt uncomfortable with Dilaudid PCA pump and aspirin to be discontinued and placed back on oral pain medications.   -Currently on oxycodone 10 mg every 4 hours as needed which we will continue in addition to IV Dilaudid as needed.   -Patient underwent dressing changes per vascular surgery on 05/14/2023.   -Patient partial weightbearing to the right foot and heel only.   -??  Need for oral antibiotics on discharge. -Will start patient on Augmentin pending ID consultation. -Will consult with ID for their input and recommendations. -Per vascular surgery.    2.  Incidental finding of pancreatic tail lesion/mass -Noted during last hospitalization concerning for pancreatic cancer. -CA 19-9 was 13. -Patient is scheduled for outpatient follow-up with [facility] and for outpatient MRCP. -It is noted that patient had a telephone visit 611 appears to have been referred for MRCP although this does not appear to have been done yet. -Per Dr. Ophelia Charter may need to consider Trousseau syndrome for his ischemic toes (migratory thrombophlebitis associated undiagnosed malignancy), however ischemic toes likely related to chronic uncontrolled diabetes and hypertension. -MRCP ordered  which showed multiseptated complex cystic lesion of the tip of the pancreatic tail measuring 3.4 x 2.1 cm unchanged in comparison to  prior exams dating back to 04/27/2019.  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  No pancreatic ductal dilatation or surrounding inflammatory changes noted.  Consider additional follow-up in 1 to 2 years or alternatively EUS/FNA for definitive tissue diagnosis.  -Outpatient follow-up.   3.  CKD stage IIIb -Stable.  4.  Acute on chronic hyponatremia -Baseline sodium 128-130. -At baseline. -Saline lock IV fluids.  5.  Hypomagnesemia -Repleted.   -Magnesium at 2.3.  6.  Diabetes mellitus type 2 -Hemoglobin A1c 7.7 (04/24/2023) -CBG 187 this morning.   - Semglee increased to 35 units daily.  -SSI.  7.  Hypertension -BP improved with pain management and on current regimen.   -Continue Norvasc 10 mg daily, Coreg 12.5 mg daily, hydralazine 100 mg 3 times daily.  IV hydralazine as needed.   - Component of increased blood pressure could be secondary to uncontrolled pain.   8.  Hyperkalemia/metabolic acidosis -Not receiving iatrogenic potassium. -Patient noted with worsening acidosis in the setting of foot necrosis, no anion gap currently. -Hyperkalemia resolved.  -Metabolic acidosis slowly improving, however with CKD may need oral bicarb supplementation however will monitor for now.  -Outpatient follow-up.  9.  Hyperlipidemia -Statin.  10.  COPD -Stable. -Continue Dulera, albuterol.   11.  Constipation -Continue current regimen of MiraLAX and Senokot-S.   12.  CAD -Stable. -Continue aspirin.   -Resume Plavix today per vascular surgery.   13.  Glaucoma -Continue brimonidine, brinzolamide, latanoprost.  14.  Insomnia -Patient noted to have reportedly finally slept for the first time 6/17 as pain was better controlled. 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 -Noted not to like trazodone effects. -Patient started on amitriptyline at bedtime on 6/17 to hopefully also help with his neuropathy. -Will continue  amitriptyline on discharge, outpatient follow-up with PCP.   DVT prophylaxis: Lovenox Code Status: Full Family Communication: Updated patient.  No family at bedside. Disposition: Home with home health hopefully in the next 24 hours if pain is adequately managed.  Status is: Inpatient Remains inpatient appropriate because: Severity of illness.   Consultants:  Vascular surgery :Dr Lenell Antu 05/09/2023  Procedures:  ABI 05/09/2023 Right Transmetatarsal amputation 05/12/2023 Per vascular surgery: Dr Edilia Bo 05/12/2023 MRI abdomen 05/14/2023  Antimicrobials:  Anti-infectives (From admission, onward)    Start     Dose/Rate Route Frequency Ordered Stop   05/10/23 1300  vancomycin (VANCOREADY) IVPB 1250 mg/250 mL  Status:  Discontinued        1,250 mg 166.7 mL/hr over 90 Minutes Intravenous Every 24 hours 05/09/23 1250 05/13/23 1013   05/09/23 1300  cefTRIAXone (ROCEPHIN) 2 g in sodium chloride 0.9 % 100 mL IVPB        2 g 200 mL/hr over 30 Minutes Intravenous Every 24 hours 05/09/23 1245 05/14/23 1421   05/09/23 1300  metroNIDAZOLE (FLAGYL) IVPB 500 mg  Status:  Discontinued        500 mg 100 mL/hr over 60 Minutes Intravenous Every 8 hours 05/09/23 1245 05/13/23 1013   05/09/23 1300  vancomycin (VANCOREADY) IVPB 1750 mg/350 mL        1,750 mg 175 mL/hr over 120 Minutes Intravenous  Once 05/09/23 1250 05/09/23 2044         Subjective: Sitting up in bed.  States was not really able to tolerate Dilaudid PCA overnight and asked  for oral pain medications to be placed back on.  Pain currently controlled on current oral regiment.  No chest pain.  No shortness of breath.  No abdominal pain.  States has decreased appetite.   .  Objective: Vitals:   05/14/23 2055 05/15/23 0514 05/15/23 0747 05/15/23 0834  BP:  132/77 139/78   Pulse:  76 73   Resp: 20 18 17    Temp:  99 F (37.2 C) 98.8 F (37.1 C)   TempSrc:  Oral Oral   SpO2: 96% 96% 97% 98%  Weight:        Intake/Output Summary  (Last 24 hours) at 05/15/2023 1218 Last data filed at 05/15/2023 1100 Gross per 24 hour  Intake 180 ml  Output 1400 ml  Net -1220 ml    Filed Weights   05/09/23 1200  Weight: 81.6 kg    Examination:  General exam: NAD. Respiratory system: Lungs clear to auscultation bilaterally.  No wheezes, no crackles, no rhonchi.  Fair air movement.  Speaking in full sentences.  Cardiovascular system: RRR no murmurs rubs or gallops.  No JVD.  No lower extremity edema.  Gastrointestinal system: Abdomen is soft, nontender, nondistended, positive bowel sounds.  No rebound.  No guarding.  Central nervous system: Alert and oriented. No focal neurological deficits. Extremities: Patient.  Status post right foot TMA in postop bandage.  Skin: No rashes, lesions or ulcers Psychiatry: Judgement and insight appear normal. Mood & affect appropriate.     Data Reviewed: I have personally reviewed following labs and imaging studies  CBC: Recent Labs  Lab 05/11/23 1039 05/12/23 0140 05/13/23 0159 05/14/23 0120 05/15/23 0743  WBC 7.9 7.4 8.0 13.3* 11.1*  NEUTROABS 4.2 4.1 6.3 10.2* 7.4  HGB 11.9* 11.5* 11.8* 13.5 12.2*  HCT 32.1* 32.3* 32.3* 37.2* 34.2*  MCV 77.7* 76.9* 77.3* 75.8* 75.7*  PLT PLATELET CLUMPS NOTED ON SMEAR, UNABLE TO ESTIMATE 224 209 259 205     Basic Metabolic Panel: Recent Labs  Lab 05/11/23 1039 05/12/23 0140 05/13/23 0159 05/14/23 0120 05/15/23 0630  NA 134* 132* 129* 130* 130*  K 5.5* 4.1 4.2 3.7 4.0  CL 105 103 101 100 98  CO2 14* 21* 17* 19* 19*  GLUCOSE 182* 170* 305* 219* 184*  BUN 24* 21 30* 25* 29*  CREATININE 1.64* 1.63* 1.79* 1.39* 1.69*  CALCIUM 9.1 8.9 8.6* 9.1 9.1  MG  --  1.7 1.6* 2.3  --      GFR: Estimated Creatinine Clearance: 47 mL/min (A) (by C-G formula based on SCr of 1.69 mg/dL (H)).  Liver Function Tests: Recent Labs  Lab 05/09/23 1014  AST 28  ALT 22  ALKPHOS 89  BILITOT 0.7  PROT 7.1  ALBUMIN 4.1     CBG: Recent Labs  Lab  05/14/23 1132 05/14/23 1619 05/14/23 2127 05/15/23 0726 05/15/23 1207  GLUCAP 236* 113* 269* 187* 216*      Recent Results (from the past 240 hour(s))  Blood Cultures x 2 sites     Status: None   Collection Time: 05/09/23  4:52 PM   Specimen: BLOOD  Result Value Ref Range Status   Specimen Description BLOOD BLOOD RIGHT HAND  Final   Special Requests   Final    BOTTLES DRAWN AEROBIC AND ANAEROBIC Blood Culture adequate volume   Culture   Final    NO GROWTH 5 DAYS Performed at [facility], [address], [place], [place] [address]    Report Status 05/14/2023 FINAL  Final  Blood Cultures x 2 sites     Status: None   Collection Time: 05/09/23  4:52 PM   Specimen: BLOOD  Result Value Ref Range Status   Specimen Description BLOOD BLOOD LEFT ARM  Final   Special Requests   Final    BOTTLES DRAWN AEROBIC AND ANAEROBIC Blood Culture adequate volume   Culture   Final    NO GROWTH 5 DAYS Performed at [facility], [address], [place], [place] [address]    Report Status 05/14/2023 FINAL  Final  MRSA Next Gen by PCR, Nasal     Status: None   Collection Time: 05/11/23  6:21 PM   Specimen: Nasal Mucosa; Nasal Swab  Result Value Ref Range Status   MRSA by PCR Next Gen NOT DETECTED NOT DETECTED Final    Comment: (NOTE) The GeneXpert MRSA Assay (FDA approved for NASAL specimens only), is one component of a comprehensive MRSA colonization surveillance program. It is not intended to diagnose MRSA infection nor to guide or monitor treatment for MRSA infections. Test performance is not FDA approved in patients less than 4 years old. Performed at [facility], [address] [place], [place] [address]          Radiology Studies: MR ABDOMEN WO CONTRAST  Result Date: 05/14/2023 CLINICAL DATA:  Pancreatic tail mass suspected EXAM: MRI ABDOMEN WITHOUT CONTRAST TECHNIQUE: Multiplanar multisequence MR imaging was performed without the administration of  intravenous contrast. COMPARISON:  CT angiogram runoff, 04/24/2023 CT abdomen, 04/27/2019 FINDINGS: Examination is limited by lack of intravenous contrast. Lower chest: No acute abnormality.  Cardiomegaly. Hepatobiliary: No obvious solid liver abnormality is seen. No gallstones, gallbladder wall thickening, or biliary dilatation. Pancreas: Multiseptated, complex cystic lesion of the tip of the pancreatic tail measuring 3.4 x 2.1 cm. This is unchanged in comparison to prior examinations dating back to 04/27/2019 (series 3, image 27). No pancreatic ductal dilatation or surrounding inflammatory changes. Spleen: Normal in size without significant abnormality. Adrenals/Urinary Tract: Adrenal glands are unremarkable. Kidneys are normal, without renal calculi, obvious solid lesion, or hydronephrosis. Stomach/Bowel: Stomach is within normal limits. No evidence of bowel wall thickening, distention, or inflammatory changes. Vascular/Lymphatic: No significant vascular findings are present. No enlarged abdominal lymph nodes. Other: No abdominal wall hernia or abnormality. No ascites. Musculoskeletal: No acute or significant osseous findings. IMPRESSION: 1. Multiseptated, complex cystic lesion of the tip of the pancreatic tail measuring 3.4 x 2.1 cm. This is unchanged in comparison to prior examinations dating back to 04/27/2019. 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 No pancreatic ductal dilatation or surrounding inflammatory changes. Although well-established stability is very reassuring for benign etiology, given size and complexity, consider additional follow-up in 1-2 years, or alternately EUS/FNA for definitive tissue diagnosis. 2. Cardiomegaly. Electronically Signed   By: Jearld Lesch M.D.   On: 05/14/2023 10:11        Scheduled Meds:  amitriptyline  25 mg Oral QHS   amLODipine  10 mg Oral Daily   aspirin EC  81 mg Oral Daily   atorvastatin  80 mg Oral Daily    brinzolamide  1 drop Both Eyes TID   And   brimonidine  1 drop Both Eyes TID   carvedilol  12.5 mg Oral BID WC   docusate sodium  100 mg Oral BID   enoxaparin (LOVENOX) injection  40 mg Subcutaneous Q24H   feeding supplement (GLUCERNA SHAKE)  237 mL Oral BID BM  folic acid  1 mg Oral Daily   gabapentin  300 mg Oral QPM   hydrALAZINE  100 mg Oral TID   hydrOXYzine  25 mg Oral Q0600   insulin aspart  0-5 Units Subcutaneous QHS   insulin aspart  0-9 Units Subcutaneous TID WC   insulin glargine-yfgn  35 Units Subcutaneous Daily   latanoprost  1 drop Both Eyes QHS   mometasone-formoterol  2 puff Inhalation BID   multivitamin with minerals  1 tablet Oral Daily   pantoprazole  40 mg Oral BID AC   polyethylene glycol  17 g Oral BID   senna-docusate  1 tablet Oral BID   thiamine  100 mg Oral Daily   Or   thiamine  100 mg Intravenous Daily   traZODone  75 mg Oral QHS   Continuous Infusions:     LOS: 6 days    Time spent: 35 mins    Ramiro Harvest, MD [facility]   To contact the attending provider between 7A-7P or the covering provider during after hours 7P-7A, please log into the web site [email or web address] and access using universal [place] password for that web site. If you do not have the password, please call the hospital operator.  05/15/2023, 12:18 PM

## 2023-05-15 NOTE — Progress Notes (Signed)
Physical Therapy Treatment Patient Details Name: Jose [name] MRN: [id number] DOB: Jul 10, 1961 Today's Date: 05/15/2023   History of Present Illness 62 y.o. male admitted 6/15 with ulceration of the right foot. Underwent TRANSMETATARSAL AMPUTATION 6/18. 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 medical history significant of CAD status post stenting x 2, CKD stage II, COPD, IDDM, HTN.    PT Comments    Safely completed stair training today, min guard for walker block and states he will have assist to get in and out of his home. Declines further training on stairs. Ambulating short distances with RW, supervision level, no loss of balance with RW for support, and safely maintains WB precautions with darco shoe in place. All questions answered. Adequate for d/c from PT standpoint when medically stable and safe with OT ADL tasks. Will continue to follow and progress as tolerated.    Recommendations for follow up therapy are one component of a multi-disciplinary discharge planning process, led by the attending physician.  Recommendations may be updated based on patient status, additional functional criteria and insurance authorization.  Follow Up Recommendations       Assistance Recommended at Discharge Set up Supervision/Assistance  Patient can return home with the following A little help with walking and/or transfers;Assist for transportation;Help with stairs or ramp for entrance   Equipment Recommendations  Rolling walker (2 wheels) (r)    Recommendations for Other Services       Precautions / Restrictions Precautions Precautions: Fall Required Braces or Orthoses: Other Brace Other Brace: Darco shoe Restrictions Weight Bearing Restrictions: No RLE Weight Bearing: Partial weight bearing RLE Partial Weight Bearing Percentage or Pounds: Through heel only with Darco shoe Other Position/Activity Restrictions: PWB through heel only on Rt      Mobility  Bed Mobility Overal bed mobility: Modified Independent             General bed mobility comments: No assist needed, extra time    Transfers Overall transfer level: Needs assistance Equipment used: Rolling walker (2 wheels) Transfers: Sit to/from Stand, Bed to chair/wheelchair/BSC Sit to Stand: Supervision Stand pivot transfers: Supervision         General transfer comment: Supervision for safety, goot UE and LLE strength for boost to stand, minimal WB on heel only, mostly NWB.    Ambulation/Gait Ambulation/Gait assistance: Supervision Gait Distance (Feet): 25 Feet Assistive device: Rolling walker (2 wheels) Gait Pattern/deviations: Step-to pattern, Decreased dorsiflexion - right Gait velocity: decr Gait velocity interpretation: <1.31 ft/sec, indicative of household ambulator   General Gait Details: Reviewed RW use and sequencing. Safely maintains PWB on Rt through heel only, mostly NWB with gait. Stable without evidence of buckling. Declines further distance at this time.   Stairs Stairs: Yes Stairs assistance: Min guard Stair Management: No rails, Step to pattern, Backwards, With walker Number of Stairs: 3 General stair comments: Performed with min guard for walker block. Demonstrated then had pt teach back sequencing and technique. Able to perform safely and feels confident with task. Declines additional training.   Wheelchair Mobility    Modified Rankin (Stroke Patients Only)       Balance Overall balance assessment: Needs assistance Sitting-balance support: Feet supported Sitting balance-Leahy Scale: Normal     Standing balance support: Bilateral upper extremity supported, During functional activity Standing balance-Leahy Scale: Poor Standing balance comment: RW  Cognition Arousal/Alertness: Awake/alert Behavior During Therapy: WFL for tasks assessed/performed Overall Cognitive Status: Within  Functional Limits for tasks assessed                                          Exercises General Exercises - Lower Extremity Ankle Circles/Pumps: AROM, Both, 10 reps, Supine (Rt weakness) Quad Sets: Strengthening, Both, 10 reps, Seated Gluteal Sets: Strengthening, Both, 10 reps, Seated Straight Leg Raises: Strengthening, Both, 5 reps, Supine    General Comments General comments (skin integrity, edema, etc.): Reviewed darco use      Pertinent Vitals/Pain Pain Assessment Pain Assessment: Faces Faces Pain Scale: Hurts even more Pain Location: left foot Pain Descriptors / Indicators: Aching, Burning Pain Intervention(s): Monitored during session, Repositioned, Limited activity within patient's tolerance, Premedicated before session    Home Living                          Prior Function            PT Goals (current goals can now be found in the care plan section) Acute Rehab PT Goals Patient Stated Goal: Get well PT Goal Formulation: With patient Time For Goal Achievement: 05/20/23 Potential to Achieve Goals: Good Progress towards PT goals: Progressing toward goals    Frequency    Min 3X/week      PT Plan Current plan remains appropriate    Co-evaluation              AM-PAC PT "6 Clicks" Mobility   Outcome Measure  Help needed turning from your back to your side while in a flat bed without using bedrails?: None Help needed moving from lying on your back to sitting on the side of a flat bed without using bedrails?: None Help needed moving to and from a bed to a chair (including a wheelchair)?: A Little Help needed standing up from a chair using your arms (e.g., wheelchair or bedside chair)?: A Little Help needed to walk in hospital room?: A Little Help needed climbing 3-5 steps with a railing? : A Little 6 Click Score: 20    End of Session Equipment Utilized During Treatment: Gait belt;Other (comment) (Rt Darco shoe) Activity  Tolerance: Patient tolerated treatment well Patient left: in chair;Other (comment) (Handoff to OT) Nurse Communication: Mobility status PT Visit Diagnosis: Pain Pain - Right/Left: Right Pain - part of body: Ankle and joints of foot     Time: 1500-1520 PT Time Calculation (min) (ACUTE ONLY): 20 min  Charges:  $Gait Training: 8-22 mins                     Kathlyn Sacramento, PT, DPT [facility]  Rehabilitation Services Physical Therapist Office: [phone] Website: [email or web address]    Berton Mount 05/15/2023, 4:20 PM

## 2023-05-15 NOTE — Progress Notes (Signed)
  Progress Note    05/15/2023 7:38 AM 3 Days Post-Op  Subjective:  Dressing changed earlier this morning.  Patient believes he tolerated it well and that the incision looks better   Vitals:   05/14/23 2055 05/15/23 0514  BP:  132/77  Pulse:  76  Resp: 20 18  Temp:  99 F (37.2 C)  SpO2: 96% 96%   Physical Exam: Lungs:  non labored Incisions:  dressing left in place R foot Abdomen:  soft Neurologic: A&O  CBC    Component Value Date/Time   WBC 13.3 (H) 05/14/2023 0120   RBC 4.91 05/14/2023 0120   HGB 13.5 05/14/2023 0120   HCT 37.2 (L) 05/14/2023 0120   PLT 259 05/14/2023 0120   MCV 75.8 (L) 05/14/2023 0120   MCH 27.5 05/14/2023 0120   MCHC 36.3 (H) 05/14/2023 0120   RDW 13.2 05/14/2023 0120   LYMPHSABS 1.8 05/14/2023 0120   MONOABS 1.2 (H) 05/14/2023 0120   EOSABS 0.0 05/14/2023 0120   BASOSABS 0.0 05/14/2023 0120    BMET    Component Value Date/Time   NA 130 (L) 05/15/2023 0630   NA 131 (L) 12/14/2020 0849   K 4.0 05/15/2023 0630   CL 98 05/15/2023 0630   CO2 19 (L) 05/15/2023 0630   GLUCOSE 184 (H) 05/15/2023 0630   BUN 29 (H) 05/15/2023 0630   BUN 26 12/14/2020 0849   CREATININE 1.69 (H) 05/15/2023 0630   CALCIUM 9.1 05/15/2023 0630   GFRNONAA 45 (L) 05/15/2023 0630   GFRAA 53 (L) 12/14/2020 0849    INR    Component Value Date/Time   INR 1.1 01/27/2022 1856     Intake/Output Summary (Last 24 hours) at 05/15/2023 4098 Last data filed at 05/15/2023 0500 Gross per 24 hour  Intake 180 ml  Output 1150 ml  Net -970 ml     Assessment/Plan:  62 y.o. male is s/p R TMA 3 Days Post-Op   Continue daily dressing changes OOB with darco shoe; heel weightbearing only Encouraged nutrition to aid healing process Office will arrange incision check in 2-3 weeks  Emilie Rutter, PA-C [facility] [phone] 05/15/2023 7:38 AM

## 2023-05-15 NOTE — Progress Notes (Signed)
    Durable Medical Equipment  (From admission, onward)           Start     Ordered   05/15/23 1412  For home use only DME Bedside commode  Once       Comments: Confined to one room  Question:  Patient needs a bedside commode to treat with the following condition  Answer:  S/P transmetatarsal amputation of foot (HCC)   05/15/23 1412   05/15/23 1408  For home use only DME Walker rolling  Once       Question Answer Comment  Walker: With 5 Inch Wheels   Patient needs a walker to treat with the following condition S/P transmetatarsal amputation of foot (HCC)      05/15/23 1409   05/15/23 1226  For home use only DME Tub bench  Once        05/15/23 1225   05/15/23 1226  For home use only DME 3 n 1  Once        05/15/23 1225

## 2023-05-15 NOTE — Progress Notes (Signed)
OT Cancellation Note  Patient Details Name: Jose [name] MRN: [id number] DOB: 1961/07/06   Cancelled Treatment:    Reason Eval/Treat Not Completed: Patient declined, no reason specified (Attempted to engage pt in tx session but he reports being too fatigued from medications. Requesting OT f/u later, OT will follow-up with patient as able.) 05/15/2023  AB, OTR/L  Acute Rehabilitation Services  Office: [phone]  [name] 05/15/2023, 1:51 PM

## 2023-05-15 NOTE — Progress Notes (Signed)
PT Cancellation Note  Patient Details Name: Jose [name] MRN: [id number] DOB: [birth year]   Cancelled Treatment:    Reason Eval/Treat Not Completed: Patient declined, no reason specified  States he has pain and bleeding from foot every time he gets up despite following WB precautions. Declines PT this morning. States he will try to work with [place] this afternoon.  Will attempt follow-up this afternoon.  Kathlyn Sacramento, PT, DPT [facility]  Rehabilitation Services Physical Therapist Office: [phone] Website: [email or web address]    Berton Mount 05/15/2023, 11:44 AM

## 2023-05-15 NOTE — Progress Notes (Signed)
Occupational Therapy Treatment Patient Details Name: Jose [name] MRN: [id number] DOB: May 26, 1961 Today's Date: 05/15/2023   History of present illness 62 y.o. male admitted 6/15 with ulceration of the right foot. Underwent TRANSMETATARSAL AMPUTATION 6/18. 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 medical history significant of CAD status post stenting x 2, CKD stage II, COPD, IDDM, HTN.   OT comments  Pt continuign to progress in OT sessions, Pt continuing to maintain NWB status on RLE due to preference and pain with any weight on RLE. Pt ambulating and completing bADLs with supervisio, educated pt on tub transfer with tub bench which he successfully completing with supervision. Pt expressing gratitude for tub bench demonstration as he had concern of getting in/out of tub. OT to continue to progress pt as able. DC plans remain appropriate for no follow-up OT post acute.    Recommendations for follow up therapy are one component of a multi-disciplinary discharge planning process, led by the attending physician.  Recommendations may be updated based on patient status, additional functional criteria and insurance authorization.    Assistance Recommended at Discharge Set up Supervision/Assistance  Patient can return home with the following  A little help with bathing/dressing/bathroom;Assistance with cooking/housework   Equipment Recommendations  Tub/shower bench (tub bench)    Recommendations for Other Services      Precautions / Restrictions Precautions Precautions: Fall Required Braces or Orthoses: Other Brace Other Brace: Darco shoe Restrictions Weight Bearing Restrictions: No RLE Weight Bearing: Partial weight bearing RLE Partial Weight Bearing Percentage or Pounds: Through heel only with Darco shoe       Mobility Bed Mobility               General bed mobility comments: Pt received sitting in recliner from PT     Transfers Overall transfer level: Needs assistance Equipment used: Rolling walker (2 wheels) Transfers: Sit to/from Stand, Bed to chair/wheelchair/BSC Sit to Stand: Independent Stand pivot transfers: Supervision         General transfer comment: Supevision no AD for stand pivot to nearby bed     Balance Overall balance assessment: Needs assistance Sitting-balance support: Feet supported Sitting balance-Leahy Scale: Normal     Standing balance support: Bilateral upper extremity supported, During functional activity Standing balance-Leahy Scale: Poor Standing balance comment: RW                           ADL either performed or assessed with clinical judgement   ADL Overall ADL's : Needs assistance/impaired                     Lower Body Dressing: Sitting/lateral leans;Modified independent           Tub/ Shower Transfer: Supervision/safety;Tub bench;Ambulation;Rolling walker (2 wheels) Tub/Shower Transfer Details (indicate cue type and reason): Educated pt on the use and technique of tub transfer with bench, pt return demonstrated understanding successfully Functional mobility during ADLs: Supervision/safety;Rolling walker (2 wheels)      Extremity/Trunk Assessment              Vision       Perception     Praxis      Cognition Arousal/Alertness: Awake/alert Behavior During Therapy: WFL for tasks assessed/performed Overall Cognitive Status: Within Functional Limits for tasks assessed  Exercises      Shoulder Instructions       General Comments Asked pt if he was willing to attempt PWB but he preferred to continue with NWB due to pain in L foot    Pertinent Vitals/ Pain       Pain Assessment Pain Assessment: Faces Faces Pain Scale: Hurts little more Pain Location: left foot Pain Descriptors / Indicators: Aching, Burning Pain Intervention(s): Monitored during session,  Repositioned  Home Living                                          Prior Functioning/Environment              Frequency  Min 2X/week        Progress Toward Goals  OT Goals(current goals can now be found in the care plan section)  Progress towards OT goals: Progressing toward goals  Acute Rehab OT Goals Patient Stated Goal: to go home OT Goal Formulation: With patient Time For Goal Achievement: 05/28/23 Potential to Achieve Goals: Good  Plan Discharge plan remains appropriate;Frequency remains appropriate    Co-evaluation                 AM-PAC OT "6 Clicks" Daily Activity     Outcome Measure   Help from another person eating meals?: None Help from another person taking care of personal grooming?: A Little Help from another person toileting, which includes using toliet, bedpan, or urinal?: A Little Help from another person bathing (including washing, rinsing, drying)?: A Little Help from another person to put on and taking off regular upper body clothing?: None Help from another person to put on and taking off regular lower body clothing?: A Little 6 Click Score: 20    End of Session Equipment Utilized During Treatment: Gait belt;Rolling walker (2 wheels)  OT Visit Diagnosis: Unsteadiness on feet (R26.81);Pain Pain - Right/Left: Left Pain - part of body: Ankle and joints of foot   Activity Tolerance Patient tolerated treatment well   Patient Left with call bell/phone within reach;in bed   Nurse Communication Mobility status        Time: 4782-9562 OT Time Calculation (min): 20 min  Charges: OT General Charges $OT Visit: 1 Visit OT Treatments $Therapeutic Activity: 8-22 mins  05/15/2023  AB, OTR/L  Acute Rehabilitation Services  Office: [phone]   [name] 05/15/2023, 4:03 PM

## 2023-05-15 NOTE — Progress Notes (Signed)
PCA pump d/c'd; 28 mL Hydromorphone (30mg /7mL) wasted at this time with Truett Mainland., RN

## 2023-05-16 ENCOUNTER — Other Ambulatory Visit (HOSPITAL_COMMUNITY): Payer: Self-pay

## 2023-05-16 DIAGNOSIS — E1169 Type 2 diabetes mellitus with other specified complication: Secondary | ICD-10-CM | POA: Diagnosis not present

## 2023-05-16 DIAGNOSIS — I2510 Atherosclerotic heart disease of native coronary artery without angina pectoris: Secondary | ICD-10-CM | POA: Diagnosis not present

## 2023-05-16 DIAGNOSIS — I739 Peripheral vascular disease, unspecified: Secondary | ICD-10-CM | POA: Diagnosis not present

## 2023-05-16 DIAGNOSIS — E118 Type 2 diabetes mellitus with unspecified complications: Secondary | ICD-10-CM | POA: Diagnosis not present

## 2023-05-16 LAB — GLUCOSE, CAPILLARY
Glucose-Capillary: 119 mg/dL — ABNORMAL HIGH (ref 70–99)
Glucose-Capillary: 339 mg/dL — ABNORMAL HIGH (ref 70–99)
Glucose-Capillary: 150 mg/dL — ABNORMAL HIGH (ref 70–99)
Glucose-Capillary: 140 mg/dL — ABNORMAL HIGH (ref 70–99)

## 2023-05-16 LAB — BASIC METABOLIC PANEL
Anion gap: 10 (ref 5–15)
BUN: 32 mg/dL — ABNORMAL HIGH (ref 8–23)
CO2: 20 mmol/L — ABNORMAL LOW (ref 22–32)
Calcium: 9 mg/dL (ref 8.9–10.3)
Chloride: 101 mmol/L (ref 98–111)
Creatinine, Ser: 1.76 mg/dL — ABNORMAL HIGH (ref 0.61–1.24)
GFR, Estimated: 43 mL/min — ABNORMAL LOW (ref >60)
Glucose, Bld: 140 mg/dL — ABNORMAL HIGH (ref 70–99)
Potassium: 3.7 mmol/L (ref 3.5–5.1)
Sodium: 131 mmol/L — ABNORMAL LOW (ref 135–145)

## 2023-05-16 LAB — CBC
HCT: 31.7 % — ABNORMAL LOW (ref 39.0–52.0)
Hemoglobin: 11.3 g/dL — ABNORMAL LOW (ref 13.0–17.0)
MCH: 27.2 pg (ref 26.0–34.0)
MCHC: 35.6 g/dL (ref 30.0–36.0)
MCV: 76.2 fL — ABNORMAL LOW (ref 80.0–100.0)
Platelets: 199 10*3/uL (ref 150–400)
RBC: 4.16 MIL/uL — ABNORMAL LOW (ref 4.22–5.81)
RDW: 13.5 % (ref 11.5–15.5)
WBC: 9.9 10*3/uL (ref 4.0–10.5)
nRBC: 0 % (ref 0.0–0.2)

## 2023-05-16 MED ORDER — AMOXICILLIN-POT CLAVULANATE 875-125 MG PO TABS
1.0000 | ORAL_TABLET | Freq: Two times a day (BID) | ORAL | 0 refills | Status: AC
  Filled 2023-05-16: qty 10, 5d supply, fill #0

## 2023-05-16 MED ORDER — DOXYCYCLINE HYCLATE 100 MG PO TABS
100.0000 mg | ORAL_TABLET | Freq: Two times a day (BID) | ORAL | 0 refills | Status: AC
  Filled 2023-05-16: qty 10, 5d supply, fill #0

## 2023-05-16 MED ORDER — DOXYCYCLINE HYCLATE 100 MG PO TABS: 100.0000 mg | ORAL_TABLET | Freq: Two times a day (BID) | ORAL | Status: DC

## 2023-05-16 MED ORDER — DOXYCYCLINE HYCLATE 100 MG PO TABS
100.0000 mg | ORAL_TABLET | Freq: Two times a day (BID) | ORAL | Status: DC
  Administered 2023-05-16 – 2023-05-18 (×4): 100 mg via ORAL
  Filled 2023-05-16 (×4): qty 1

## 2023-05-16 NOTE — Consult Note (Signed)
[facility]                                                                                        Patient Identification: Patient Name: Jose [name] MRN: [id number] Admit Date: 05/09/2023  9:41 AM Today's Date: 05/16/2023 Reason for consult: Antibiotic recommendations Requesting provider: Dr. Janee Morn  Principal Problem:   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 [facility]) Active Problems:   Hypertension   Hyponatremia   Type 2 diabetes mellitus with complication, with long-term current use of insulin (HCC)   Hyperkalemia   Dyslipidemia associated with type 2 diabetes mellitus (HCC)   CKD (chronic kidney disease) stage 3, GFR 30-59 ml/min   COPD (chronic obstructive pulmonary disease) (HCC)   CAD (coronary artery disease)   Pancreatic mass   Constipation   Glaucoma   Metabolic acidosis   Antibiotics:  Vancomycin 6/16-6/18 Ceftriaxone 6/16-6/20, Augmentin 6/21- Metronidazole 6/16-6/18  Lines/Hardware:  Assessment 62 year old male with prior history of hypertension, HLD, DM2, COPD, CKD, CAD, alcohol/cocaine abuse, CAD, PAD with recent vascular intervention on 6/3 with shockwave lithotripsy/balloon angioplasty of rt popliteal artery who initially presented to the ED on 6/15 with necrotic toes in his right foot with surrounding redness for 3 days.  # Right foot gangrene 2/2 ischemia  6/18 status post right TMA.  Or findings-marginal bleeding at the amputation site.  No mention of remaining infection post OR.  Recommendations  Continue augmentin, add doxycycline today. EOT for both abtx 6/28 Post op care per Vascular  Pain control per primary/Vascular ID wil so, please recall if needed   Rest of the management as per the primary team. Please call with questions or concerns.  Thank you for the  consult  __________________________________________________________________________________________________________ HPI and Hospital Course: 62 year old male with prior history of hypertension, HLD, DM2, COPD, CKD, CAD, alcohol/cocaine abuse, CAD, PAD with recent vascular intervention on 6/3 with shockwave lithotripsy/balloon angioplasty of rt popliteal artery who initially presented to the ED on 6/15 with necrotic toes in his right foot with surrounding redness for 3 days.  Seen by vascular and status post right TMA 6/18.  Or findings-marginal bleeding at the amputation site.  No mention of remaining infection post or.  No cultures or path sent.  ID consulted for antibiotic recommendation.   Patient has been afebrile throughout hospitalization.  Leukocytosis noted on 6/20 which has since resolved.  Patient also had MRCP done for evaluation of incidental pancreatic tail lesion/mass with findings as below  ROS: General- Denies fever, chills, loss of appetite and loss of weight HEENT - Denies headache, blurry vision, neck pain, sinus pain Chest - Denies any chest pain, SOB or cough CVS- Denies any dizziness/lightheadedness, syncopal attacks, palpitations Abdomen- Denies any nausea, vomiting, abdominal pain, hematochezia and diarrhea Neuro - Denies any weakness, numbness, tingling sensation Psych - Denies any changes in mood irritability or depressive symptoms GU- Denies any burning, dysuria, hematuria or increased frequency of urination Skin - denies any rashes/lesions MSK - pain at the rt foot    Past Medical History:  Diagnosis Date   Alcohol abuse    CAD (coronary artery disease)  a. Reported MI in 2012 s/p 2 stents;  b. 08/2012 Cath: LM 20, LAD 20 diff ISR, jailed septal - 99%, LCX 30ost, RI large, min irregs, RCA 30p, 20-30 ISR-->Med Rx; c. 2017 [facility]   CKD (chronic kidney disease), stage III (HCC)    Cocaine abuse (HCC)    COPD (chronic obstructive  pulmonary disease) (HCC)    Depression    Diabetes mellitus without complication (HCC)    Hyperlipidemia    Hypertension    Hypertensive urgency 02/11/2021   Noncompliance    Past Surgical History:  Procedure Laterality Date   ABDOMINAL AORTOGRAM W/LOWER EXTREMITY N/A 04/27/2023   Procedure: ABDOMINAL AORTOGRAM W/LOWER EXTREMITY;  Surgeon: Maeola Harman, MD;  Location: [facility] INVASIVE CV LAB;  Service: Cardiovascular;  Laterality: N/A;   CARDIAC CATHETERIZATION     CORONARY STENT PLACEMENT     LEFT HEART CATH AND CORONARY ANGIOGRAPHY N/A 08/22/2020   Procedure: LEFT HEART CATH AND CORONARY ANGIOGRAPHY;  Surgeon: Lyn Records, MD;  Location: [facility] INVASIVE CV LAB;  Service: Cardiovascular;  Laterality: N/A;   LEFT HEART CATH AND CORONARY ANGIOGRAPHY N/A 01/28/2022   Procedure: LEFT HEART CATH AND CORONARY ANGIOGRAPHY;  Surgeon: Lennette Bihari, MD;  Location: [facility] INVASIVE CV LAB;  Service: Cardiovascular;  Laterality: N/A;   LEFT HEART CATHETERIZATION WITH CORONARY ANGIOGRAM Bilateral 08/25/2012   Procedure: LEFT HEART CATHETERIZATION WITH CORONARY ANGIOGRAM;  Surgeon: Kathleene Hazel, MD;  Location: [facility] CATH LAB;  Service: Cardiovascular;  Laterality: Bilateral;   PERIPHERAL INTRAVASCULAR LITHOTRIPSY  04/27/2023   Procedure: PERIPHERAL INTRAVASCULAR LITHOTRIPSY;  Surgeon: Maeola Harman, MD;  Location: [facility] INVASIVE CV LAB;  Service: Cardiovascular;;   PERIPHERAL VASCULAR BALLOON ANGIOPLASTY  04/27/2023   Procedure: PERIPHERAL VASCULAR BALLOON ANGIOPLASTY;  Surgeon: Maeola Harman, MD;  Location: [facility] INVASIVE CV LAB;  Service: Cardiovascular;;   TRANSMETATARSAL AMPUTATION Right 05/12/2023   Procedure: TRANSMETATARSAL AMPUTATION;  Surgeon: Chuck Hint, MD;  Location: [facility] OR;  Service: Vascular;  Laterality: Right;    Scheduled Meds:  amitriptyline  25 mg Oral QHS   amLODipine  10 mg Oral Daily   amoxicillin-clavulanate  1 tablet Oral Q12H   aspirin EC  81  mg Oral Daily   atorvastatin  80 mg Oral Daily   brinzolamide  1 drop Both Eyes TID   And   brimonidine  1 drop Both Eyes TID   carvedilol  12.5 mg Oral BID WC   clopidogrel  75 mg Oral Q breakfast   docusate sodium  100 mg Oral BID   enoxaparin (LOVENOX) injection  40 mg Subcutaneous Q24H   feeding supplement (GLUCERNA SHAKE)  237 mL Oral BID BM   folic acid  1 mg Oral Daily   gabapentin  300 mg Oral QPM   hydrALAZINE  100 mg Oral TID   hydrOXYzine  25 mg Oral Q0600   insulin aspart  0-5 Units Subcutaneous QHS   insulin aspart  0-9 Units Subcutaneous TID WC   insulin glargine-yfgn  35 Units Subcutaneous Daily   latanoprost  1 drop Both Eyes QHS   mometasone-formoterol  2 puff Inhalation BID   multivitamin with minerals  1 tablet Oral Daily   pantoprazole  40 mg Oral BID AC   polyethylene glycol  17 g Oral BID   senna-docusate  1 tablet Oral BID   thiamine  100 mg Oral Daily   Or   thiamine  100 mg Intravenous Daily   traZODone  75 mg Oral QHS   Continuous Infusions: PRN Meds:.acetaminophen **OR** acetaminophen, albuterol, bisacodyl, hydrALAZINE, HYDROmorphone (DILAUDID) injection, hydrOXYzine, methocarbamol, ondansetron **OR** ondansetron (ZOFRAN) IV, oxyCODONE, polyethylene glycol   Allergies  Allergen Reactions   Lisinopril-Hydrochlorothiazide Swelling and Other (See Comments)    Causes swelling of lips   Simvastatin Other (See Comments)    ALT (SGPT) level raised, Aspartate aminotransferase serum level raised   Social History   Socioeconomic History   Marital status: Married    Spouse name: Not on file   Number of children: Not on file   Years of education: Not on file   Highest education level: Not on file  Occupational History   Not on file  Tobacco Use   Smoking status: Former    Types: Cigarettes    Quit date: 11/25/2007    Years since quitting: 15.4   Smokeless tobacco: Never  Vaping Use   Vaping Use: Never used  Substance and Sexual Activity   Alcohol  use: Yes    Comment: Used to drink heavily - says currently 2 beers/day.   Drug use: Yes    Types: Marijuana    Comment: reports not using cocaine   Sexual activity: Yes    Birth control/protection: None  Other Topics Concern   Not on file  Social History Narrative   Lives in [place] by himself.  Does not work or routinely exercise.   Social Determinants of Health   Financial Resource Strain: Not on file  Food Insecurity: No Food Insecurity (05/09/2023)   Hunger Vital Sign    Worried About Running Out of Food in the Last Year: Never true    Ran Out of Food in the Last Year: Never true  Transportation Needs: No Transportation Needs (05/09/2023)   PRAPARE - Administrator, Civil Service (Medical): No    Lack of Transportation (Non-Medical): No  Physical Activity: Not on file  Stress: Not on file  Social Connections: Not on file  Intimate Partner Violence: Not At Risk (05/09/2023)   Humiliation, Afraid, Rape, and Kick questionnaire    Fear of Current or Ex-Partner: No    Emotionally Abused: No    Physically Abused: No    Sexually Abused: No   Family History  Problem Relation Age of Onset   Heart disease Mother        MI 19s    Vitals BP 111/72 (BP Location: Right Arm)   Pulse 80   Temp 98.5 F (36.9 C) (Oral)   Resp 16   Wt 81.6 kg   SpO2 97%   BMI 25.09 kg/m   Physical Exam Constitutional: Adult male sitting in the bed, moderate to severe pain    Comments:   Cardiovascular:     Rate and Rhythm: Normal rate and regular rhythm.     Heart sounds: s1s2   Pulmonary:     Effort: Pulmonary effort is normal.     Comments: Normal breath sounds  Abdominal:     Palpations: Abdomen is soft.     Tenderness: Nondistended and nontender  Musculoskeletal:        General: rt foot is wrapped in a bandage C/D/I.  No swelling tenderness or erythema in other peripheral joints  Skin:    Comments: no rashes   Neurological:     General: Awake, alert and oriented,  following commands  Psychiatric:        Mood and Affect: Mood normal.    Pertinent Microbiology Results for orders placed or  performed during the hospital encounter of 05/09/23  Blood Cultures x 2 sites     Status: None   Collection Time: 05/09/23  4:52 PM   Specimen: BLOOD  Result Value Ref Range Status   Specimen Description BLOOD BLOOD RIGHT HAND  Final   Special Requests   Final    BOTTLES DRAWN AEROBIC AND ANAEROBIC Blood Culture adequate volume   Culture   Final    NO GROWTH 5 DAYS Performed at [facility], [address], [place], [place] [address]    Report Status 05/14/2023 FINAL  Final  Blood Cultures x 2 sites     Status: None   Collection Time: 05/09/23  4:52 PM   Specimen: BLOOD  Result Value Ref Range Status   Specimen Description BLOOD BLOOD LEFT ARM  Final   Special Requests   Final    BOTTLES DRAWN AEROBIC AND ANAEROBIC Blood Culture adequate volume   Culture   Final    NO GROWTH 5 DAYS Performed at [facility], [address], [place], [place] [address]    Report Status 05/14/2023 FINAL  Final  MRSA Next Gen by PCR, Nasal     Status: None   Collection Time: 05/11/23  6:21 PM   Specimen: Nasal Mucosa; Nasal Swab  Result Value Ref Range Status   MRSA by PCR Next Gen NOT DETECTED NOT DETECTED Final    Comment: (NOTE) The GeneXpert MRSA Assay (FDA approved for NASAL specimens only), is one component of a comprehensive MRSA colonization surveillance program. It is not intended to diagnose MRSA infection nor to guide or monitor treatment for MRSA infections. Test performance is not FDA approved in patients less than 72 years old. Performed at [facility], [address] [place], [place] [address]     Pertinent Lab seen by me:    Latest Ref Rng & Units 05/16/2023    3:33 AM 05/15/2023    7:43 AM 05/14/2023    1:20 AM  CBC  WBC 4.0 - 10.5 K/uL 9.9  11.1  13.3   Hemoglobin 13.0 - 17.0 g/dL 91.4  78.2  95.6   Hematocrit 39.0 - 52.0 %  31.7  34.2  37.2   Platelets 150 - 400 K/uL 199  205  259       Latest Ref Rng & Units 05/16/2023    3:33 AM 05/15/2023    6:30 AM 05/14/2023    1:20 AM  CMP  Glucose 70 - 99 mg/dL 213  086  578   BUN 8 - 23 mg/dL 32  29  25   Creatinine 0.61 - 1.24 mg/dL 4.69  6.29  5.28   Sodium 135 - 145 mmol/L 131  130  130   Potassium 3.5 - 5.1 mmol/L 3.7  4.0  3.7   Chloride 98 - 111 mmol/L 101  98  100   CO2 22 - 32 mmol/L 20  19  19    Calcium 8.9 - 10.3 mg/dL 9.0  9.1  9.1      Pertinent Imagings/Other Imagings Plain films and CT images have been personally visualized and interpreted; radiology reports have been reviewed. Decision making incorporated into the Impression / Recommendations.  MR ABDOMEN WO CONTRAST  Result Date: 05/14/2023 CLINICAL DATA:  Pancreatic tail mass suspected EXAM: MRI ABDOMEN WITHOUT CONTRAST TECHNIQUE: Multiplanar multisequence MR imaging was performed without the administration of intravenous contrast. COMPARISON:  CT angiogram runoff, 04/24/2023 CT abdomen, 04/27/2019 FINDINGS: Examination is limited by  lack of intravenous contrast. Lower chest: No acute abnormality.  Cardiomegaly. Hepatobiliary: No obvious solid liver abnormality is seen. No gallstones, gallbladder wall thickening, or biliary dilatation. Pancreas: Multiseptated, complex cystic lesion of the tip of the pancreatic tail measuring 3.4 x 2.1 cm. This is unchanged in comparison to prior examinations dating back to 04/27/2019 (series 3, image 27). No pancreatic ductal dilatation or surrounding inflammatory changes. Spleen: Normal in size without significant abnormality. Adrenals/Urinary Tract: Adrenal glands are unremarkable. Kidneys are normal, without renal calculi, obvious solid lesion, or hydronephrosis. Stomach/Bowel: Stomach is within normal limits. No evidence of bowel wall thickening, distention, or inflammatory changes. Vascular/Lymphatic: No significant vascular findings are present. No enlarged abdominal  lymph nodes. Other: No abdominal wall hernia or abnormality. No ascites. Musculoskeletal: No acute or significant osseous findings. IMPRESSION: 1. Multiseptated, complex cystic lesion of the tip of the pancreatic tail measuring 3.4 x 2.1 cm. This is unchanged in comparison to prior examinations dating back to 04/27/2019. 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 No pancreatic ductal dilatation or surrounding inflammatory changes. Although well-established stability is very reassuring for benign etiology, given size and complexity, consider additional follow-up in 1-2 years, or alternately EUS/FNA for definitive tissue diagnosis. 2. Cardiomegaly. Electronically Signed   By: Jearld Lesch M.D.   On: 05/14/2023 10:11   VAS [place] ABI WITH/WO TBI  Result Date: 05/11/2023  LOWER EXTREMITY DOPPLER STUDY Patient Name:  Jari Favre Junior [name]  Date of Exam:   05/09/2023 Medical Rec #: [id number]           Accession #:    [id number] Date of Birth: 1961/06/03           Patient Gender: M Patient Age:   62 years Exam Location:  [facility] Procedure:      VAS [place] ABI WITH/WO TBI Referring Phys: Heath Lark --------------------------------------------------------------------------------  Indications: Ulceration, and gangrene. High Risk Factors: Hypertension, hyperlipidemia, Diabetes.  Vascular Interventions: Status post angioplasty 04/27/23 to right popliteal                         artery. Comparison Study: Prior ABI done 04/24/2023 Performing Technologist: Sherren Kerns RVS  Examination Guidelines: A complete evaluation includes at minimum, Doppler waveform signals and systolic blood pressure reading at the level of bilateral brachial, anterior tibial, and posterior tibial arteries, when vessel segments are accessible. Bilateral testing is considered an integral part of a complete examination. Photoelectric Plethysmograph (PPG) waveforms and toe systolic pressure readings are included  as required and additional duplex testing as needed. Limited examinations for reoccurring indications may be performed as noted.  ABI Findings: +---------+------------------+-----+-----------+--------+ Right    Rt Pressure (mmHg)IndexWaveform   Comment  +---------+------------------+-----+-----------+--------+ Brachial 156                    multiphasic         +---------+------------------+-----+-----------+--------+ ATA      254               1.52 multiphasic         +---------+------------------+-----+-----------+--------+ PTA      254               1.52 multiphasic         +---------+------------------+-----+-----------+--------+ Great Toe0                 0.00                     +---------+------------------+-----+-----------+--------+ +---------+------------------+-----+-----------+-------+  Left     Lt Pressure (mmHg)IndexWaveform   Comment +---------+------------------+-----+-----------+-------+ Brachial 167                    triphasic          +---------+------------------+-----+-----------+-------+ ATA      254               1.52 multiphasic        +---------+------------------+-----+-----------+-------+ PTA      254               1.52 multiphasic        +---------+------------------+-----+-----------+-------+ Great Toe0                 0.00                    +---------+------------------+-----+-----------+-------+ +-------+-----------+-----------+------------+------------+ ABI/TBIToday's ABIToday's TBIPrevious ABIPrevious TBI +-------+-----------+-----------+------------+------------+ Right  1.52       absent     1.28        absent       +-------+-----------+-----------+------------+------------+ Left   1.52       absent     1.28        0.37         +-------+-----------+-----------+------------+------------+  Arterial wall calcification precludes accurate ankle pressures and ABIs. Right and Bilateral ABIs appear increased  compared to prior study on 04/24/2023. Right TBIs appear unchanged since prior study done 04/24/2023. Left TBIs appear decreased compared to prior study on 04/24/2023.  Summary: Right: Resting right ankle-brachial index indicates noncompressible right lower extremity arteries. The right toe-brachial index is abnormal. Left: Resting left ankle-brachial index indicates noncompressible left lower extremity arteries. The left toe-brachial index is abnormal. *See table(s) above for measurements and observations.  Electronically signed by Sherald Hess MD on 05/11/2023 at 9:22:13 AM.    Final    VAS [place] LOWER EXTREMITY ARTERIAL DUPLEX  Result Date: 05/11/2023 LOWER EXTREMITY ARTERIAL DUPLEX STUDY Patient Name:  LEMOND GRIFFEE Yurick  Date of Exam:   05/09/2023 Medical Rec #: [id number]           Accession #:    [id number] Date of Birth: [birth year]           Patient Gender: M Patient Age:   61 years Exam Location:  [facility] Procedure:      VAS [place] LOWER EXTREMITY ARTERIAL DUPLEX Referring Phys: Heath Lark --------------------------------------------------------------------------------  Indications: Ulceration, and gangrene. High Risk Factors: Hypertension, hyperlipidemia, Diabetes.  Vascular Interventions: Angioplasty to right popliteal done 04/27/2023. Current ABI:            non compressible bilaterally with absent toe waveforms Comparison Study: No prior study Performing Technologist: Sherren Kerns RVS  Examination Guidelines: A complete evaluation includes B-mode imaging, spectral Doppler, color Doppler, and power Doppler as needed of all accessible portions of each vessel. Bilateral testing is considered an integral part of a complete examination. Limited examinations for reoccurring indications may be performed as noted.  +----------+--------+-----+--------+-----------+----------------+ RIGHT     PSV cm/sRatioStenosisWaveform   Comments          +----------+--------+-----+--------+-----------+----------------+ CFA Prox  143                  multiphasic                 +----------+--------+-----+--------+-----------+----------------+ DFA       71                   multiphasic                 +----------+--------+-----+--------+-----------+----------------+  SFA Prox  82                   multiphasic                 +----------+--------+-----+--------+-----------+----------------+ SFA Mid   80                   multiphasic                 +----------+--------+-----+--------+-----------+----------------+ SFA Distal85                   multiphasic                 +----------+--------+-----+--------+-----------+----------------+ POP Prox  45                   multiphasic                 +----------+--------+-----+--------+-----------+----------------+ POP Distal61                   multiphasic                 +----------+--------+-----+--------+-----------+----------------+ ATA Prox  26                   monophasic                  +----------+--------+-----+--------+-----------+----------------+ ATA Mid   73                   monophasic                  +----------+--------+-----+--------+-----------+----------------+ ATA Distal28                   monophasic                  +----------+--------+-----+--------+-----------+----------------+ PTA Prox  65                   monophasic                  +----------+--------+-----+--------+-----------+----------------+ PTA Mid   56                   monophasic                  +----------+--------+-----+--------+-----------+----------------+ PTA Distal31                   monophasic collateral noted +----------+--------+-----+--------+-----------+----------------+   Summary: Right: Calcified but patent vessels noted throughout with multiphasic waveforms in the CFA, DFA, SFA, and Popliteal arteries and monophasic flow in the  posterior tibial and anterior tibial arteries.  See table(s) above for measurements and observations. Electronically signed by Sherald Hess MD on 05/11/2023 at 9:22:00 AM.    Final    PERIPHERAL VASCULAR CATHETERIZATION  Result Date: 04/27/2023 Images from the original result were not included. 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 Randie Heinz, MD Procedure Performed: 1.  Ultrasound-guided cannulation and Mynx device closure left common femoral artery 2.  Catheter in aorta and bilateral lower extremity runoff 3.  Selection with catheter of right popliteal artery 4.  6 x 60 mm shockwave lithotripsy right popliteal artery 5.  Drug-coated balloon angioplasty 6 x 40 mm Ranger right popliteal artery 6.  Moderate sedation with fentanyl and Versed for 69  minutes Indications: 62 year old male with chronic right lower extremity limb threatening ischemia with dampened monophasic ABIs and 0 toe pressure on the right with a palpable popliteal pulse and nothing palpable below.  He is now indicated for angiography with possible intervention. Findings: His aorta and bilateral common external iliac arteries were patent as are the bilateral common femoral arteries.  He does have a steep aortic bifurcation.  The right SFA is calcified but there is no evidence of any stenosis.  Just below the knee joint there is heavy calcification of the popliteal artery it is subtotally occluded.  After shockwave lithotripsy and drug-coated balloon angioplasty this is reduced to 0% stenosis where previously was greater than 90% stenosis with heavy calcification.  The dominant runoff on the right side is via the peroneal artery which gives rise to the plantar vessels that are posterior tibial base.  There is posterior tibial artery runoff but this occludes above  the ankle and the anterior tibial artery is occluded much higher in the leg.  At completion there is a multiphasic peroneal artery signal at the ankle.  Procedure:  The patient was identified in the holding area and taken to room 8.  The patient was then placed supine on the table and prepped and draped in the usual sterile fashion.  A time out was called.  Ultrasound was used to evaluate the left common femoral artery which was noted to be patent and the area was anesthetized with 1% lidocaine and cannulated with micropuncture needle followed by wire and a sheath.  And images saved to the permanent record.  Concomitantly we administered fentanyl and Versed for moderate sedation and his vital signs were monitored throughout the case.  We placed a Bentson wire followed by 5 [place] sheath and Omni catheter to the level of L1 and aortogram was performed followed by limited bilateral lower extremity angiography to evaluate the bilateral common femoral arteries bilateral common and external iliac arteries.  We then crossed the bifurcation using a Glidewire and the Omni catheter and perform right lower extremity angiography and then used a Glidewire and quick cross catheter to the level of the knee to perform angiography below the knee and identify the tibial runoff.  With the above findings we placed a long 6 French sheath over a Glidewire advantage the patient was fully heparinized.  We then used a quick cross catheter and Glidewire advantage were able to cross the subtotally occluded popliteal artery and confirmed intraluminal access.  We placed an 014 wire and then performed lithotripsy with a shockwave 6 x 60 mm balloon for a total of 300 pulses.  Completion angiography demonstrated minimal residual stenosis and the calcium had significantly improved.  We then primarily performed drug-coated balloon angioplasty with a 6 x 40 mm Ranger at nominal pressure.  Completion demonstrated no residual stenosis no dissection's.   Runoff was via the peroneal artery there is possibly a 30% stenosis at the takeoff of the tibioperoneal trunk this is not treated.  We exchanged for a short 6 French sheath over a Bentson wire and deployed a minx device.  Patient tolerated procedure without any complication. Contrast: 105cc Brandon C. Randie Heinz, MD [facility] Office: [phone] Pager: [phone]  VAS [place] ABI WITH/WO TBI  Result Date: 04/25/2023  LOWER EXTREMITY DOPPLER STUDY Patient Name:  Jari Favre Junior [name]  Date of Exam:   04/24/2023 Medical Rec #: [id number]           Accession #:  [id number] Date of Birth: [birth year]           Patient Gender: M Patient Age:   27 years Exam Location:  [facility] Procedure:      VAS [place] ABI WITH/WO TBI Referring Phys: Sherald Hess --------------------------------------------------------------------------------  Indications: Peripheral artery disease. High Risk         Hypertension, hyperlipidemia, Diabetes, coronary artery Factors:          disease.  Comparison Study: No previous study. Performing Technologist: McKayla Maag RVT, [place]  Examination Guidelines: A complete evaluation includes at minimum, Doppler waveform signals and systolic blood pressure reading at the level of bilateral brachial, anterior tibial, and posterior tibial arteries, when vessel segments are accessible. Bilateral testing is considered an integral part of a complete examination. Photoelectric Plethysmograph (PPG) waveforms and toe systolic pressure readings are included as required and additional duplex testing as needed. Limited examinations for reoccurring indications may be performed as noted.  ABI Findings: +---------+------------------+-----+------------------+------------------------+ Right    Rt Pressure (mmHg)IndexWaveform          Comment                  +---------+------------------+-----+------------------+------------------------+ Brachial 199                    biphasic                                    +---------+------------------+-----+------------------+------------------------+ PTA      255               1.28 monophasic        Non-compressible         +---------+------------------+-----+------------------+------------------------+ DP                              dampened          Obtained with 2D imaging                                 monophasic                                 +---------+------------------+-----+------------------+------------------------+ Great Toe                       Absent                                     +---------+------------------+-----+------------------+------------------------+ +---------+------------------+-----+----------+--------------------------------+ Left     Lt Pressure (mmHg)IndexWaveform  Comment                          +---------+------------------+-----+----------+--------------------------------+ Brachial                        biphasic  No pressure obtained due to IV                                             placement.                       +---------+------------------+-----+----------+--------------------------------+  PTA      255               1.28 monophasicNon-compressible                 +---------+------------------+-----+----------+--------------------------------+ DP       255               1.28 monophasicNon-compressible                 +---------+------------------+-----+----------+--------------------------------+ Great Toe73                0.37 Abnormal                                   +---------+------------------+-----+----------+--------------------------------+ +-------+----------------+-----------+------------+------------+ ABI/TBIToday's ABI     Today's TBIPrevious ABIPrevious TBI +-------+----------------+-----------+------------+------------+ Right  Non-compressibe Absent                               +-------+----------------+-----------+------------+------------+ Left   Non-compressible0.37                                +-------+----------------+-----------+------------+------------+   Summary: Right: Resting right ankle-brachial index indicates noncompressible right lower extremity arteries. The right toe-brachial index is abnormal. Left: Resting left ankle-brachial index indicates noncompressible left lower extremity arteries. The left toe-brachial index is abnormal. *See table(s) above for measurements and observations.  Electronically signed by Sherald Hess MD on 04/25/2023 at 9:37:19 AM.    Final    CT Angio Aortobifemoral W and/or Wo Contrast  Result Date: 04/24/2023 CLINICAL DATA:  Claudication or leg ischemia. Toes turned black after wearing new shoes. EXAM: CT ANGIOGRAPHY OF ABDOMINAL AORTA WITH ILIOFEMORAL RUNOFF TECHNIQUE: Multidetector CT imaging of the abdomen, pelvis and lower extremities was performed using the standard protocol during bolus administration of intravenous contrast. Multiplanar CT image reconstructions and MIPs were obtained to evaluate the vascular anatomy. RADIATION DOSE REDUCTION: This exam was performed according to the departmental dose-optimization program which includes automated exposure control, adjustment of the mA and/or kV according to patient size and/or use of iterative reconstruction technique. CONTRAST:  OMNIPAQUE IOHEXOL 350 MG/ML SOLN COMPARISON:  CT abdomen pelvis 04/27/2019 FINDINGS: VASCULAR Aorta: Mild calcified atheromatous plaque without aneurysm or flow-limiting stenosis. Celiac: Patent without evidence of aneurysm, dissection, vasculitis or significant stenosis. SMA: Patent without evidence of aneurysm, dissection, vasculitis or significant stenosis. Renals: Both renal arteries are patent without evidence of aneurysm, dissection, vasculitis, fibromuscular dysplasia or significant stenosis. IMA: Patent without evidence of aneurysm,  dissection, vasculitis or significant stenosis. RIGHT Lower Extremity Inflow: Common, internal and external iliac arteries are patent without evidence of aneurysm, dissection, vasculitis or significant stenosis. Outflow: Minimal calcified and noncalcified atheromatous plaque of the right common femoral artery without significant stenosis. Superficial femoral and profunda femoris arteries are diffusely calcified without significant narrowing. Moderate to severe focal stenosis of the right popliteal artery best seen on image 444 of series 5. Runoff: Lower leg arteries are diffusely calcified which limits the ability to determine if they are opacified. The peroneal artery appears patent. Anterior and posterior tibial arteries are likely occluded. LEFT Lower Extremity Inflow: Common, internal and external iliac arteries are patent without evidence of aneurysm, dissection, vasculitis or significant stenosis. Outflow: No significant stenosis of the common femoral artery. Superficial femoral, profundus femoris, and popliteal arteries are diffusely calcified but  patent without significant stenosis. Runoff: Lower leg arteries are diffusely calcified which limits evaluation for opacification. The peroneal artery is patent to the level of the ankle. Unable to definitively determine if anterior and posterior tibial arteries are patent are occluded. Veins: No obvious venous abnormality within the limitations of this arterial phase study. Review of the MIP images confirms the above findings. NON-VASCULAR Lower chest: No acute abnormality. Extensive coronary artery calcifications. Hepatobiliary: No focal liver abnormality is seen. No gallstones, gallbladder wall thickening, or biliary dilatation. Pancreas: 3.5 x 2.3 cm pancreatic tail mass (image 75, series 5) requires further evaluation with pancreatic mass protocol CT or MRI. No pancreatic duct dilatation. Spleen: Normal in size without focal abnormality. Adrenals/Urinary Tract:  Adrenal glands are normal. 3 mm nonobstructing calculus present in the lower pole of the right kidney. Stomach/Bowel: Stomach is within normal limits. Appendix appears normal. No evidence of bowel wall thickening, distention, or inflammatory changes. Lymphatic: No enlarged abdominal or pelvic lymph nodes. Reproductive: Prostate is unremarkable. Other: No abdominal wall hernia or abnormality. No abdominopelvic ascites. Musculoskeletal: Posttraumatic changes of the right mid tibia and fibula consistent with prior healed fractures. Right 10 mm loose body seen in the left knee joint. Tibial intramedullary rod is intact. Deformity of proximal left tibia and fibula consistent with remote healed fractures. IMPRESSION: 1. Extensive diffuse atherosclerotic calcification of the lower leg arteries. 2. 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 3. Lower leg arteries are diffusely calcified which limits evaluation for opacification. There is at least single-vessel runoff to both ankles through the peroneal arteries. Unable to definitively determine if anterior and posterior tibial arteries are opacified. 4. 3.5 x 2.3 cm pancreatic tail mass requires further evaluation with nonemergent pancreatic mass protocol CT or MRI. 5. 3 mm nonobstructing calculus in the lower pole of the right kidney. Electronically Signed   By: Acquanetta Belling M.D.   On: 04/24/2023 13:00   DG Foot Complete Right  Result Date: 04/24/2023 CLINICAL DATA:  Right foot pain for 1 month. EXAM: RIGHT FOOT COMPLETE - 3+ VIEW COMPARISON:  None Available. FINDINGS: There is no evidence of acute fracture or dislocation. There is no evidence of arthropathy. Vascular calcifications are noted. Status post intramedullary rod fixation of right tibia. IMPRESSION: No acute abnormality seen. Electronically Signed   By: Lupita Raider M.D.   On: 04/24/2023 09:20    I have personally spent 81  minutes involved in face-to-face and non-face-to-face  activities for this patient on the day of the visit. Professional time spent includes the following activities: Preparing to see the patient (review of tests), Obtaining and/or reviewing separately obtained history (admission/discharge record), Performing a medically appropriate examination and/or evaluation , Ordering medications/tests/procedures, referring and communicating with other health care professionals, Documenting clinical information in the EMR, Independently interpreting results (not separately reported), Communicating results to the patient/family/caregiver, Counseling and educating the patient/family/caregiver and Care coordination (not separately reported).  Electronically signed by:   Plan d/w requesting provider as well as ID pharm D  Note: This document was prepared using dragon voice recognition software and may include unintentional dictation errors.   Odette Fraction, MD Infectious Disease Physician [facility] Pager: [phone]

## 2023-05-16 NOTE — Progress Notes (Signed)
PROGRESS NOTE    Jose [name]  WUJ:[id number] DOB: 07-29-1961 DOA: 05/09/2023 PCP: [name]    Chief Complaint  Patient presents with   Necrotic Toes Rt Foot    Brief Narrative:  62yo male with h/o remote polysubstance abuse (ETOH, cocaine), PVD, CAD s/p stents, stage 3 CKD, COPD, DM, HTN, and HLD who presented on 6/15 for necrotic foot.  He was recently admitted from 5/31-6/6 for dry gangrene and underwent lithotripsy of the R popliteal artery and angioplasty, plan to follow up with vascular surgery in a month to allow necrotic foot to demarcate. During that visit he was also noted to have an incidental pancreatic tail mass that was suspicion for pancreatic CA and was recommended to have outpatient MRCP. Pain and discoloration were worsening so he returned to the ER. Evaluation appears to show progressive necrosis despite apparently intact circulation.  Vascular surgery is consulting and ordered ABIs (B noncompressible LE arteries with abnormal B toe-brachial indices) and RLE arterial duplex (calcified but patent vessels throughout with flow noted).  Status post R TMA planned 6/18.     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 -During last hospitalization patient underwent successful revascularization. -Patient presented with progressive necrosis, surrounding  erythema possible cellulitis. -Patient seen by vascular surgery, noted to be at risk for poor healing and vascular surgery recommended TMA which patient underwent 05/12/2023. -Patient with normal lactate, CRP, sed rate, prealbumin. -Blood cultures with no growth x 5 days. -Plavix was held on admission and has been resumed as of 05/15/2023.  -Status post 5 days IV Rocephin. -Continue aspirin, gabapentin, oxycodone, Dilaudid for pain control. -Patient noted to have uncontrolled pain the evening of 05/12/2023 and the morning of 05/13/2023.   -Patient seen by vascular surgery on Dilaudid PCA pump ordered 05/14/2023, however overnight patient felt uncomfortable with Dilaudid PCA pump and aspirin to be discontinued and placed back on oral pain medications.   -Currently on oxycodone 10 mg every 4 hours as needed, IV Dilaudid as needed. -Patient underwent dressing changes per vascular surgery on 05/14/2023.   -Patient partial weightbearing to the right foot and heel only.  -It is noted per vascular surgery that patient has some necrotic edges laterally which will be followed up in the outpatient setting. -Patient placed back on Augmentin on 05/15/2023 while awaiting ID consultation. -Patient seen in consultation by ID who are recommending addition of doxycycline 100 mg twice daily with end date of treatment for antibiotics 05/22/2023. -Patient also noted to be complaining of significant pain when dressing changes are done to his foot as well as with working with physical therapy. -Will have pain medications given prior to dressing changes as well as 30 minutes to an hour prior to physical therapy. -ID following and appreciate their input and recommendations. -Per vascular surgery.  2.  Incidental finding of pancreatic tail lesion/mass -Noted during last hospitalization  concerning for pancreatic cancer. -CA 19-9 was 13. -Patient is scheduled for outpatient follow-up with [facility] and for outpatient MRCP. -It is  noted that patient had a telephone visit 611 appears to have been referred for MRCP although this does not appear to have been done yet. -Per Dr. Ophelia Charter may need to consider Trousseau syndrome for his ischemic toes (migratory thrombophlebitis associated undiagnosed malignancy), however ischemic toes likely related to chronic uncontrolled diabetes and hypertension. -MRCP ordered which showed multiseptated complex cystic lesion of the tip of the pancreatic tail measuring 3.4 x 2.1 cm unchanged in comparison to prior exams dating back to 04/27/2019.  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  No pancreatic ductal dilatation or surrounding inflammatory changes noted.  Consider additional follow-up in 1 to 2 years or alternatively EUS/FNA for definitive tissue diagnosis.  -Outpatient follow-up.   3.  CKD stage IIIb -Stable.  4.  Acute on chronic hyponatremia -Baseline sodium 128-130. -At baseline. -Saline lock IV fluids.  5.  Hypomagnesemia -Repleted.   -Magnesium at 2.3.  6.  Diabetes mellitus type 2 -Hemoglobin A1c 7.7 (04/24/2023) -CBG 119 this morning.   -Continue Semglee 35 units daily.   -SSI.   -May need meal coverage NovoLog however will monitor for now.   7.  Hypertension -BP improved with pain management and on current antihypertensive regimen.   -Continue Norvasc 10 mg daily, Coreg 12.5 mg daily, hydralazine 100 mg 3 times daily.  IV hydralazine as needed.     8.  Hyperkalemia/metabolic acidosis -Not receiving iatrogenic potassium. -Patient noted with worsening acidosis in the setting of foot necrosis, no anion gap currently. -Hyperkalemia resolved.  -Metabolic acidosis improving daily.   -Outpatient follow-up.   9.  Hyperlipidemia -Statin.  10.  COPD -Stable. -Continue Dulera, albuterol.   11.  Constipation -Continue current bowel regimen of MiraLAX and Senokot-S.    12.  CAD -Stable.   -Continue aspirin, Plavix.    13.   Glaucoma -Continue brimonidine, brinzolamide, latanoprost.  14.  Insomnia -Patient noted to have reportedly finally slept for the first time 6/17 as pain was better controlled. 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 -Noted not to like trazodone effects. -Patient started on amitriptyline at bedtime on 6/17 to hopefully also help with his neuropathy. -Continue amitriptyline on discharge, outpatient follow-up with PCP.    DVT prophylaxis: Lovenox Code Status: Full Family Communication: Updated patient.  No family at bedside. Disposition: Home with home health hopefully in the next 24-48 hours if pain is adequately managed.  Status is: Inpatient Remains inpatient appropriate because: Severity of illness.   Consultants:  Vascular surgery :Dr Lenell Antu 05/09/2023 ID: Dr.[email or web address] 05/16/2023  Procedures:  ABI 05/09/2023 Right Transmetatarsal amputation 05/12/2023 Per vascular surgery: Dr Edilia Bo 05/12/2023 MRI abdomen 05/14/2023  Antimicrobials:  Anti-infectives (From admission, onward)    Start     Dose/Rate Route Frequency Ordered Stop   05/22/23 1000  doxycycline (VIBRA-TABS) tablet 100 mg        100 mg Oral Every 12 hours 05/16/23 0850 05/29/23 0959   05/15/23 1615  amoxicillin-clavulanate (AUGMENTIN) 875-125 MG per tablet 1 tablet        1 tablet Oral Every 12 hours 05/15/23 1528 05/25/23 2159   05/10/23 1300  vancomycin (VANCOREADY) IVPB 1250 mg/250 mL  Status:  Discontinued        1,250 mg 166.7 mL/hr over 90 Minutes Intravenous Every 24 hours 05/09/23 1250 05/13/23 1013   05/09/23 1300  cefTRIAXone (ROCEPHIN)  2 g in sodium chloride 0.9 % 100 mL IVPB        2 g 200 mL/hr over 30 Minutes Intravenous Every 24 hours 05/09/23 1245 05/14/23 1421   05/09/23 1300  metroNIDAZOLE (FLAGYL) IVPB 500 mg  Status:  Discontinued        500 mg 100 mL/hr over 60 Minutes Intravenous Every 8 hours 05/09/23 1245 05/13/23 1013   05/09/23 1300  vancomycin (VANCOREADY) IVPB  1750 mg/350 mL        1,750 mg 175 mL/hr over 120 Minutes Intravenous  Once 05/09/23 1250 05/09/23 2044         Subjective: Sitting up in bed.  Complain of significant pain in his right foot.  States has significant pain when dressing is changed on his foot, and when he has to move or work with therapy.  Currently tolerating current pain regimen.  No chest pain.  No shortness of breath.  No abdominal pain.  Objective: Vitals:   05/15/23 2038 05/15/23 2050 05/16/23 0442 05/16/23 0800  BP: (!) 140/68  111/72 (!) 152/75  Pulse: 72  80 70  Resp: 15  16 20   Temp: 99.4 F (37.4 C)  98.5 F (36.9 C)   TempSrc:   Oral   SpO2: 100% 98% 97%   Weight:        Intake/Output Summary (Last 24 hours) at 05/16/2023 1211 Last data filed at 05/16/2023 0900 Gross per 24 hour  Intake 480 ml  Output 1050 ml  Net -570 ml    Filed Weights   05/09/23 1200  Weight: 81.6 kg    Examination:  General exam: NAD Respiratory system: CTAB.  No wheezes, no crackles, no rhonchi.  Fair air movement.  Speaking in full sentences.  Cardiovascular system: Regular rate and rhythm no murmurs rubs or gallops.  No JVD.  No lower extremity edema.  Gastrointestinal system: Abdomen is soft, nontender, nondistended, positive bowel sounds.  No rebound.  No guarding.  Central nervous system: Alert and oriented. No focal neurological deficits. Extremities: Patient.  Status post right foot TMA in postop bandage.  Skin: No rashes, lesions or ulcers Psychiatry: Judgement and insight appear normal. Mood & affect appropriate.          Data Reviewed: I have personally reviewed following labs and imaging studies  CBC: Recent Labs  Lab 05/11/23 1039 05/12/23 0140 05/13/23 0159 05/14/23 0120 05/15/23 0743 05/16/23 0333  WBC 7.9 7.4 8.0 13.3* 11.1* 9.9  NEUTROABS 4.2 4.1 6.3 10.2* 7.4  --   HGB 11.9* 11.5* 11.8* 13.5 12.2* 11.3*  HCT 32.1* 32.3* 32.3* 37.2* 34.2* 31.7*  MCV 77.7* 76.9* 77.3* 75.8* 75.7*  76.2*  PLT PLATELET CLUMPS NOTED ON SMEAR, UNABLE TO ESTIMATE 224 209 259 205 199     Basic Metabolic Panel: Recent Labs  Lab 05/12/23 0140 05/13/23 0159 05/14/23 0120 05/15/23 0630 05/16/23 0333  NA 132* 129* 130* 130* 131*  K 4.1 4.2 3.7 4.0 3.7  CL 103 101 100 98 101  CO2 21* 17* 19* 19* 20*  GLUCOSE 170* 305* 219* 184* 140*  BUN 21 30* 25* 29* 32*  CREATININE 1.63* 1.79* 1.39* 1.69* 1.76*  CALCIUM 8.9 8.6* 9.1 9.1 9.0  MG 1.7 1.6* 2.3  --   --      GFR: Estimated Creatinine Clearance: 45.2 mL/min (A) (by C-G formula based on SCr of 1.76 mg/dL (H)).  Liver Function Tests: No results for input(s): "AST", "ALT", "ALKPHOS", "BILITOT", "PROT", "ALBUMIN" in the last 168 hours.  CBG: Recent Labs  Lab 05/15/23 0726 05/15/23 1207 05/15/23 1651 05/15/23 2140 05/16/23 0748  GLUCAP 187* 216* 152* 174* 119*      Recent Results (from the past 240 hour(s))  Blood Cultures x 2 sites     Status: None   Collection Time: 05/09/23  4:52 PM   Specimen: BLOOD  Result Value Ref Range Status   Specimen Description BLOOD BLOOD RIGHT HAND  Final   Special Requests   Final    BOTTLES DRAWN AEROBIC AND ANAEROBIC Blood Culture adequate volume   Culture   Final    NO GROWTH 5 DAYS Performed at [facility], [address] [place], [place] [address]    Report Status 05/14/2023 FINAL  Final  Blood Cultures x 2 sites     Status: None   Collection Time: 05/09/23  4:52 PM   Specimen: BLOOD  Result Value Ref Range Status   Specimen Description BLOOD BLOOD LEFT ARM  Final   Special Requests   Final    BOTTLES DRAWN AEROBIC AND ANAEROBIC Blood Culture adequate volume   Culture   Final    NO GROWTH 5 DAYS Performed at [facility], [address], [place], [place] [address]    Report Status 05/14/2023 FINAL  Final  MRSA Next Gen by PCR, Nasal     Status: None   Collection Time: 05/11/23  6:21 PM   Specimen: Nasal Mucosa; Nasal Swab  Result Value Ref Range Status    MRSA by PCR Next Gen NOT DETECTED NOT DETECTED Final    Comment: (NOTE) The GeneXpert MRSA Assay (FDA approved for NASAL specimens only), is one component of a comprehensive MRSA colonization surveillance program. It is not intended to diagnose MRSA infection nor to guide or monitor treatment for MRSA infections. Test performance is not FDA approved in patients less than 73 years old. Performed at [facility], [address] [place], [place] [address]          Radiology Studies: No results found.      Scheduled Meds:  amitriptyline  25 mg Oral QHS   amLODipine  10 mg Oral Daily   amoxicillin-clavulanate  1 tablet Oral Q12H   aspirin EC  81 mg Oral Daily   atorvastatin  80 mg Oral Daily   brinzolamide  1 drop Both Eyes TID   And   brimonidine  1 drop Both Eyes TID   carvedilol  12.5 mg Oral BID WC   clopidogrel  75 mg Oral Q breakfast   docusate sodium  100 mg Oral BID   [START ON 05/22/2023] doxycycline  100 mg Oral Q12H   enoxaparin (LOVENOX) injection  40 mg Subcutaneous Q24H   feeding supplement (GLUCERNA SHAKE)  237 mL Oral BID BM   folic acid  1 mg Oral Daily   gabapentin  300 mg Oral QPM   hydrALAZINE  100 mg Oral TID   hydrOXYzine  25 mg Oral Q0600   insulin aspart  0-5 Units Subcutaneous QHS   insulin aspart  0-9 Units Subcutaneous TID WC   insulin glargine-yfgn  35 Units Subcutaneous Daily   latanoprost  1 drop Both Eyes QHS   mometasone-formoterol  2 puff Inhalation BID   multivitamin with minerals  1 tablet Oral Daily   pantoprazole  40 mg Oral BID AC   polyethylene glycol  17 g Oral BID   senna-docusate  1 tablet Oral BID   thiamine  100 mg Oral  Daily   Or   thiamine  100 mg Intravenous Daily   traZODone  75 mg Oral QHS   Continuous Infusions:     LOS: 7 days    Time spent: 40 mins    Ramiro Harvest, MD [facility]   To contact the attending provider between 7A-7P or the covering provider during after hours 7P-7A,  please log into the web site [email or web address] and access using universal [place] password for that web site. If you do not have the password, please call the hospital operator.  05/16/2023, 12:11 PM

## 2023-05-16 NOTE — Progress Notes (Signed)
VASCULAR SURGERY ASSESSMENT & PLAN:   POD 4 RIGHT TMA: So far, his transmetatarsal site is healing adequately.  He he has some necrotic edges laterally which I can follow as an outpatient.  He has no pedal disease.  I have stressed with him the importance of not putting weight on the foot and using his Darco shoe so that he is only putting weight on the heel.  I will arrange follow-up in the office if he is discharged.  SUBJECTIVE:   No specific complaints this morning.  PHYSICAL EXAM:   Vitals:   05/15/23 1400 05/15/23 2038 05/15/23 2050 05/16/23 0442  BP: 111/78 (!) 140/68  111/72  Pulse: 80 72  80  Resp: 17 15  16   Temp: 98.8 F (37.1 C) 99.4 F (37.4 C)  98.5 F (36.9 C)  TempSrc: Oral   Oral  SpO2: 99% 100% 98% 97%  Weight:       His transmetatarsal amputation site is healing adequately.  He does have some necrotic edges laterally which we will follow as an outpatient.      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) Active Problems:   Hypertension   Hyponatremia   Type 2 diabetes mellitus with complication, with long-term current use of insulin (HCC)   Hyperkalemia   Dyslipidemia associated with type 2 diabetes mellitus (HCC)   CKD (chronic kidney disease) stage 3, GFR 30-59 ml/min   COPD (chronic obstructive pulmonary disease) (HCC)   CAD (coronary artery disease)   Pancreatic mass   Constipation   Glaucoma   Metabolic acidosis   CURRENT MEDS:    amitriptyline  25 mg Oral QHS   amLODipine  10 mg Oral Daily    amoxicillin-clavulanate  1 tablet Oral Q12H   aspirin EC  81 mg Oral Daily   atorvastatin  80 mg Oral Daily   brinzolamide  1 drop Both Eyes TID   And   brimonidine  1 drop Both Eyes TID   carvedilol  12.5 mg Oral BID WC   clopidogrel  75 mg Oral Q breakfast   docusate sodium  100 mg Oral BID   enoxaparin (LOVENOX) injection  40 mg Subcutaneous Q24H   feeding supplement (GLUCERNA SHAKE)  237 mL Oral BID BM   folic acid  1 mg Oral Daily   gabapentin  300 mg Oral QPM   hydrALAZINE  100 mg Oral TID   hydrOXYzine  25 mg Oral Q0600   insulin aspart  0-5 Units Subcutaneous QHS   insulin aspart  0-9 Units Subcutaneous TID WC   insulin glargine-yfgn  35 Units Subcutaneous Daily   latanoprost  1 drop Both Eyes QHS   mometasone-formoterol  2 puff Inhalation BID   multivitamin with minerals  1 tablet Oral Daily   pantoprazole  40 mg Oral BID AC   polyethylene glycol  17 g Oral BID   senna-docusate  1 tablet Oral BID  thiamine  100 mg Oral Daily   Or   thiamine  100 mg Intravenous Daily   traZODone  75 mg Oral QHS    Waverly Ferrari Office: [phone] 05/16/2023

## 2023-05-17 DIAGNOSIS — H409 Unspecified glaucoma: Secondary | ICD-10-CM | POA: Diagnosis not present

## 2023-05-17 DIAGNOSIS — I2510 Atherosclerotic heart disease of native coronary artery without angina pectoris: Secondary | ICD-10-CM | POA: Diagnosis not present

## 2023-05-17 DIAGNOSIS — K8689 Other specified diseases of pancreas: Secondary | ICD-10-CM | POA: Diagnosis not present

## 2023-05-17 DIAGNOSIS — I739 Peripheral vascular disease, unspecified: Secondary | ICD-10-CM | POA: Diagnosis not present

## 2023-05-17 LAB — CBC WITH DIFFERENTIAL/PLATELET
Abs Immature Granulocytes: 0.03 10*3/uL (ref 0.00–0.07)
Basophils Absolute: 0 10*3/uL (ref 0.0–0.1)
Basophils Relative: 0 %
Eosinophils Absolute: 0.2 10*3/uL (ref 0.0–0.5)
Eosinophils Relative: 2 %
HCT: 31 % — ABNORMAL LOW (ref 39.0–52.0)
Hemoglobin: 11.4 g/dL — ABNORMAL LOW (ref 13.0–17.0)
Immature Granulocytes: 0 %
Lymphocytes Relative: 24 %
Lymphs Abs: 2.1 10*3/uL (ref 0.7–4.0)
MCH: 28.1 pg (ref 26.0–34.0)
MCHC: 36.8 g/dL — ABNORMAL HIGH (ref 30.0–36.0)
MCV: 76.4 fL — ABNORMAL LOW (ref 80.0–100.0)
Monocytes Absolute: 0.8 10*3/uL (ref 0.1–1.0)
Monocytes Relative: 9 %
Neutro Abs: 5.8 10*3/uL (ref 1.7–7.7)
Neutrophils Relative %: 65 %
Platelets: 198 10*3/uL (ref 150–400)
RBC: 4.06 MIL/uL — ABNORMAL LOW (ref 4.22–5.81)
RDW: 13.4 % (ref 11.5–15.5)
WBC: 8.8 10*3/uL (ref 4.0–10.5)
nRBC: 0 % (ref 0.0–0.2)

## 2023-05-17 LAB — BASIC METABOLIC PANEL
Anion gap: 8 (ref 5–15)
BUN: 36 mg/dL — ABNORMAL HIGH (ref 8–23)
CO2: 21 mmol/L — ABNORMAL LOW (ref 22–32)
Calcium: 9.2 mg/dL (ref 8.9–10.3)
Chloride: 101 mmol/L (ref 98–111)
Creatinine, Ser: 1.73 mg/dL — ABNORMAL HIGH (ref 0.61–1.24)
GFR, Estimated: 44 mL/min — ABNORMAL LOW (ref >60)
Glucose, Bld: 167 mg/dL — ABNORMAL HIGH (ref 70–99)
Potassium: 3.9 mmol/L (ref 3.5–5.1)
Sodium: 130 mmol/L — ABNORMAL LOW (ref 135–145)

## 2023-05-17 LAB — GLUCOSE, CAPILLARY
Glucose-Capillary: 106 mg/dL — ABNORMAL HIGH (ref 70–99)
Glucose-Capillary: 197 mg/dL — ABNORMAL HIGH (ref 70–99)
Glucose-Capillary: 167 mg/dL — ABNORMAL HIGH (ref 70–99)
Glucose-Capillary: 143 mg/dL — ABNORMAL HIGH (ref 70–99)

## 2023-05-17 LAB — MAGNESIUM: Magnesium: 2.2 mg/dL (ref 1.7–2.4)

## 2023-05-17 NOTE — Progress Notes (Signed)
VASCULAR SURGERY ASSESSMENT & PLAN:   POD 4 RIGHT TMA: I looked at his TMA again today.  Some of the dark area on the lateral aspect of the TMA was dried blood.  I think the TMA actually looks better in this area.  I think he has a good chance of healing this but will need to really pamper this.  He should be partial weightbearing only on the heel using his Darco shoe.  Okay for discharge from my standpoint.  I have arranged follow-up in my office for staple removal.  SUBJECTIVE:   No complaints this morning.  PHYSICAL EXAM:   Vitals:   05/16/23 0800 05/16/23 1412 05/16/23 2103 05/17/23 0439  BP: (!) 152/75 116/71 105/65 109/67  Pulse: 70 67 69 63  Resp: 20 16 18 16   Temp:  98.4 F (36.9 C) 97.9 F (36.6 C) 98.7 F (37.1 C)  TempSrc:   Oral   SpO2:  99% 98% 97%  Weight:       His right TMA looks good and was redressed.  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) Active Problems:   Hypertension   Hyponatremia   Type 2 diabetes mellitus with complication, with long-term current use of insulin (HCC)   Hyperkalemia   Dyslipidemia associated with type 2 diabetes mellitus (HCC)   CKD (chronic kidney disease) stage 3, GFR 30-59 ml/min   COPD (chronic obstructive pulmonary disease) (HCC)   CAD (coronary artery disease)   Pancreatic mass   Constipation   Glaucoma   Metabolic acidosis   CURRENT MEDS:    amitriptyline  25 mg Oral QHS   amLODipine  10 mg Oral Daily   amoxicillin-clavulanate  1 tablet Oral Q12H   aspirin EC   81 mg Oral Daily   atorvastatin  80 mg Oral Daily   brinzolamide  1 drop Both Eyes TID   And   brimonidine  1 drop Both Eyes TID   carvedilol  12.5 mg Oral BID WC   clopidogrel  75 mg Oral Q breakfast   docusate sodium  100 mg Oral BID   doxycycline  100 mg Oral Q12H   enoxaparin (LOVENOX) injection  40 mg Subcutaneous Q24H   feeding supplement (GLUCERNA SHAKE)  237 mL Oral BID BM   folic acid  1 mg Oral Daily   gabapentin  300 mg Oral QPM   hydrALAZINE  100 mg Oral TID   hydrOXYzine  25 mg Oral Q0600   insulin aspart  0-5 Units Subcutaneous QHS   insulin aspart  0-9 Units Subcutaneous TID WC   insulin glargine-yfgn  35 Units Subcutaneous Daily   latanoprost  1 drop Both Eyes QHS   mometasone-formoterol  2 puff Inhalation BID   multivitamin with minerals  1 tablet Oral Daily   pantoprazole  40 mg Oral BID AC   polyethylene glycol  17 g Oral BID   senna-docusate  1 tablet Oral  BID   thiamine  100 mg Oral Daily   Or   thiamine  100 mg Intravenous Daily   traZODone  75 mg Oral QHS    Waverly Ferrari Office: [phone] 05/17/2023

## 2023-05-17 NOTE — Progress Notes (Signed)
PROGRESS NOTE    Jose [name]  WGN:[id number] DOB: Sep 05, 1961 DOA: 05/09/2023 PCP: [name]    Chief Complaint  Patient presents with   Necrotic Toes Rt Foot    Brief Narrative:  62yo male with h/o remote polysubstance abuse (ETOH, cocaine), PVD, CAD s/p stents, stage 3 CKD, COPD, DM, HTN, and HLD who presented on 6/15 for necrotic foot.  He was recently admitted from 5/31-6/6 for dry gangrene and underwent lithotripsy of the R popliteal artery and angioplasty, plan to follow up with vascular surgery in a month to allow necrotic foot to demarcate. During that visit he was also noted to have an incidental pancreatic tail mass that was suspicion for pancreatic CA and was recommended to have outpatient MRCP. Pain and discoloration were worsening so he returned to the ER. Evaluation appears to show progressive necrosis despite apparently intact circulation.  Vascular surgery is consulting and ordered ABIs (B noncompressible LE arteries with abnormal B toe-brachial indices) and RLE arterial duplex (calcified but patent vessels throughout with flow noted).  Status post R TMA planned 6/18.     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 -During last hospitalization patient underwent successful revascularization. -Patient presented with progressive necrosis, surrounding  erythema possible cellulitis. -Patient seen by vascular surgery, noted to be at risk for poor healing and vascular surgery recommended TMA which patient underwent 05/12/2023. -Patient with normal lactate, CRP, sed rate, prealbumin. -Blood cultures with no growth x 5 days. -Plavix was held on admission and has been resumed as of 05/15/2023.  -Status post 5 days IV Rocephin. -Continue aspirin, gabapentin, oxycodone, Dilaudid for pain control. -Patient noted to have uncontrolled pain the evening of 05/12/2023 and the morning of 05/13/2023.   -Patient seen by vascular surgery on Dilaudid PCA pump ordered 05/14/2023, however overnight patient felt uncomfortable with Dilaudid PCA pump and aspirin to be discontinued and placed back on oral pain medications.   -Currently on oxycodone 10 mg every 4 hours as needed, IV Dilaudid as needed. -Patient underwent dressing changes per vascular surgery on 05/14/2023.   -Patient partial weightbearing to the right foot and heel only.  -It is noted per vascular surgery that patient has some necrotic edges laterally which will be followed up in the outpatient setting. -Patient placed back on Augmentin on 05/15/2023 while awaiting ID consultation. -Patient seen in consultation by ID who are recommending addition of doxycycline 100 mg twice daily with end date of treatment for antibiotics 05/22/2023. -Patient also noted to be complaining of significant pain when dressing changes are done to his foot as well as with working with physical therapy. -Pain medications to be given prior to dressing changes as well as 30 minutes to an hour prior to physical therapy. -ID following and appreciate their input and recommendations. -Per vascular surgery.  2.  Incidental finding of pancreatic tail lesion/mass -Noted during last hospitalization  concerning for pancreatic cancer. -CA 19-9 was 13. -Patient is scheduled for outpatient follow-up with [facility] and for outpatient MRCP. -It is noted  that patient had a telephone visit 611 appears to have been referred for MRCP although this does not appear to have been done yet. -Per Dr. Ophelia Charter may need to consider Trousseau syndrome for his ischemic toes (migratory thrombophlebitis associated undiagnosed malignancy), however ischemic toes likely related to chronic uncontrolled diabetes and hypertension. -MRCP ordered which showed multiseptated complex cystic lesion of the tip of the pancreatic tail measuring 3.4 x 2.1 cm unchanged in comparison to prior exams dating back to 04/27/2019.  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  No pancreatic ductal dilatation or surrounding inflammatory changes noted.  Consider additional follow-up in 1 to 2 years or alternatively EUS/FNA for definitive tissue diagnosis.  -Outpatient follow-up.   3.  CKD stage IIIb -Stable.  4.  Acute on chronic hyponatremia -Baseline sodium 128-130. -At baseline. -Saline lock IV fluids.  5.  Hypomagnesemia -Repleted.   -Magnesium at 2.2.  6.  Diabetes mellitus type 2 -Hemoglobin A1c 7.7 (04/24/2023) -CBG 106 this morning.  -Continue Semglee 35 units daily.   -SSI.    7.  Hypertension -BP improved with pain management and on current antihypertensive regimen.   -Continue Norvasc 10 mg daily, Coreg 12.5 mg twice daily, hydralazine 100 mg 3 times daily.  IV hydralazine as needed.    8.  Hyperkalemia/metabolic acidosis -Not receiving iatrogenic potassium. -Patient noted with worsening acidosis in the setting of foot necrosis, no anion gap currently. -Hyperkalemia resolved.  -Metabolic acidosis improving daily.   -Outpatient follow-up.   9.  Hyperlipidemia -Statin.  10.  COPD -Stable. -Continue albuterol, Dulera.    11.  Constipation -Continue MiraLAX and Senokot-S.    12.  CAD -Stable.   -Aspirin, Plavix.    13.  Glaucoma -Continue brimonidine, brinzolamide, latanoprost.  14.  Insomnia -Patient noted to have reportedly  finally slept for the first time 6/17 as pain was better controlled. 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 -Noted not to like trazodone effects. -Patient started on amitriptyline at bedtime on 6/17 to hopefully also help with his neuropathy. -Continue amitriptyline on discharge, outpatient follow-up with PCP.    DVT prophylaxis: Lovenox Code Status: Full Family Communication: Updated patient.  No family at bedside. Disposition: Home with home health hopefully in the next 24-48 hours if pain is adequately managed.  Status is: Inpatient Remains inpatient appropriate because: Severity of illness.   Consultants:  Vascular surgery :Dr Lenell Antu 05/09/2023 ID: Dr.[email or web address] 05/16/2023  Procedures:  ABI 05/09/2023 Right Transmetatarsal amputation 05/12/2023 Per vascular surgery: Dr Edilia Bo 05/12/2023 MRI abdomen 05/14/2023  Antimicrobials:  Anti-infectives (From admission, onward)    Start     Dose/Rate Route Frequency Ordered Stop   05/22/23 1000  doxycycline (VIBRA-TABS) tablet 100 mg  Status:  Discontinued        100 mg Oral Every 12 hours 05/16/23 0850 05/16/23 1523   05/17/23 0000  amoxicillin-clavulanate (AUGMENTIN) 875-125 MG tablet        1 tablet Oral Every 12 hours 05/16/23 1224 05/22/23 2359   05/17/23 0000  doxycycline (VIBRA-TABS) 100 MG tablet        100 mg Oral Every 12 hours 05/16/23 1224 05/22/23 2359   05/16/23 1530  doxycycline (VIBRA-TABS) tablet 100 mg        100 mg Oral Every 12 hours 05/16/23 1523 05/23/23 1959   05/15/23 1615  amoxicillin-clavulanate (AUGMENTIN) 875-125 MG per tablet 1 tablet        1 tablet Oral Every 12 hours 05/15/23 1528 05/25/23 2159   05/10/23 1300  vancomycin (VANCOREADY) IVPB 1250 mg/250 mL  Status:  Discontinued        1,250 mg 166.7 mL/hr over 90 Minutes Intravenous Every 24 hours 05/09/23 1250 05/13/23 1013   05/09/23 1300  cefTRIAXone (ROCEPHIN) 2 g in sodium chloride 0.9 % 100 mL IVPB        2 g 200 mL/hr  over 30 Minutes Intravenous Every 24 hours 05/09/23 1245 05/14/23 1421   05/09/23 1300  metroNIDAZOLE (FLAGYL) IVPB 500 mg  Status:  Discontinued        500 mg 100 mL/hr over 60 Minutes Intravenous Every 8 hours 05/09/23 1245 05/13/23 1013   05/09/23 1300  vancomycin (VANCOREADY) IVPB 1750 mg/350 mL        1,750 mg 175 mL/hr over 120 Minutes Intravenous  Once 05/09/23 1250 05/09/23 2044         Subjective: Patient laying in bed.  Still with complaints of pain in his foot with dressing changes.  No chest pain.  No shortness of breath.  No abdominal pain.  States he saw vascular surgery this morning and they had mentioned concerns of still ongoing infection.    Objective: Vitals:   05/16/23 2103 05/17/23 0439 05/17/23 0842 05/17/23 1329  BP: 105/65 109/67 118/76 122/63  Pulse: 69 63 77 75  Resp: 18 16 18 17   Temp: 97.9 F (36.6 C) 98.7 F (37.1 C) 98.6 F (37 C) 99.1 F (37.3 C)  TempSrc: Oral  Oral Oral  SpO2: 98% 97% 98% 97%  Weight:        Intake/Output Summary (Last 24 hours) at 05/17/2023 1434 Last data filed at 05/17/2023 1100 Gross per 24 hour  Intake 240 ml  Output 300 ml  Net -60 ml    Filed Weights   05/09/23 1200  Weight: 81.6 kg    Examination:  General exam: NAD Respiratory system: Lungs clear to auscultation bilaterally.  No wheezes, no crackles, no rhonchi.  Fair air movement.  Speaking full sentences.  Cardiovascular system: RRR no murmurs rubs or gallops.  No JVD.  No lower extremity edema.   Gastrointestinal system: Abdomen is soft, nontender, nondistended, positive bowel sounds.  No rebound.  No guarding.   Central nervous system: Alert and oriented. No focal neurological deficits. Extremities: Patient.  Status post right foot TMA in postop bandage.  Skin: No rashes, lesions or ulcers Psychiatry: Judgement and insight appear normal. Mood & affect appropriate.          Data Reviewed: I have personally reviewed following labs and imaging  studies  CBC: Recent Labs  Lab 05/12/23 0140 05/13/23 0159 05/14/23 0120 05/15/23 0743 05/16/23 0333 05/17/23 0129  WBC 7.4 8.0 13.3* 11.1* 9.9 8.8  NEUTROABS 4.1 6.3 10.2* 7.4  --  5.8  HGB 11.5* 11.8* 13.5 12.2* 11.3* 11.4*  HCT 32.3* 32.3* 37.2* 34.2* 31.7* 31.0*  MCV 76.9* 77.3* 75.8* 75.7* 76.2* 76.4*  PLT 224 209 259 205 199 198     Basic Metabolic Panel: Recent Labs  Lab 05/12/23 0140 05/13/23 0159 05/14/23 0120 05/15/23 0630 05/16/23 0333 05/17/23 0129  NA 132* 129* 130* 130* 131* 130*  K 4.1 4.2 3.7 4.0 3.7 3.9  CL 103 101 100 98 101 101  CO2 21* 17* 19* 19* 20* 21*  GLUCOSE 170* 305* 219* 184* 140* 167*  BUN 21  30* 25* 29* 32* 36*  CREATININE 1.63* 1.79* 1.39* 1.69* 1.76* 1.73*  CALCIUM 8.9 8.6* 9.1 9.1 9.0 9.2  MG 1.7 1.6* 2.3  --   --  2.2     GFR: Estimated Creatinine Clearance: 45.9 mL/min (A) (by C-G formula based on SCr of 1.73 mg/dL (H)).  Liver Function Tests: No results for input(s): "AST", "ALT", "ALKPHOS", "BILITOT", "PROT", "ALBUMIN" in the last 168 hours.   CBG: Recent Labs  Lab 05/16/23 1212 05/16/23 1738 05/16/23 2102 05/17/23 0729 05/17/23 1203  GLUCAP 339* 150* 140* 106* 197*      Recent Results (from the past 240 hour(s))  Blood Cultures x 2 sites     Status: None   Collection Time: 05/09/23  4:52 PM   Specimen: BLOOD  Result Value Ref Range Status   Specimen Description BLOOD BLOOD RIGHT HAND  Final   Special Requests   Final    BOTTLES DRAWN AEROBIC AND ANAEROBIC Blood Culture adequate volume   Culture   Final    NO GROWTH 5 DAYS Performed at [facility], [address], [place], [place] [address]    Report Status 05/14/2023 FINAL  Final  Blood Cultures x 2 sites     Status: None   Collection Time: 05/09/23  4:52 PM   Specimen: BLOOD  Result Value Ref Range Status   Specimen Description BLOOD BLOOD LEFT ARM  Final   Special Requests   Final    BOTTLES DRAWN AEROBIC AND ANAEROBIC Blood Culture adequate  volume   Culture   Final    NO GROWTH 5 DAYS Performed at [facility], [address] [place], [place] [address]    Report Status 05/14/2023 FINAL  Final  MRSA Next Gen by PCR, Nasal     Status: None   Collection Time: 05/11/23  6:21 PM   Specimen: Nasal Mucosa; Nasal Swab  Result Value Ref Range Status   MRSA by PCR Next Gen NOT DETECTED NOT DETECTED Final    Comment: (NOTE) The GeneXpert MRSA Assay (FDA approved for NASAL specimens only), is one component of a comprehensive MRSA colonization surveillance program. It is not intended to diagnose MRSA infection nor to guide or monitor treatment for MRSA infections. Test performance is not FDA approved in patients less than 84 years old. Performed at [facility], [address] [place], [place] [address]          Radiology Studies: No results found.      Scheduled Meds:  amitriptyline  25 mg Oral QHS   amLODipine  10 mg Oral Daily   amoxicillin-clavulanate  1 tablet Oral Q12H   aspirin EC  81 mg Oral Daily   atorvastatin  80 mg Oral Daily   brinzolamide  1 drop Both Eyes TID   And   brimonidine  1 drop Both Eyes TID   carvedilol  12.5 mg Oral BID WC   clopidogrel  75 mg Oral Q breakfast   docusate sodium  100 mg Oral BID   doxycycline  100 mg Oral Q12H   enoxaparin (LOVENOX) injection  40 mg Subcutaneous Q24H   feeding supplement (GLUCERNA SHAKE)  237 mL Oral BID BM   folic acid  1 mg Oral Daily   gabapentin  300 mg Oral QPM   hydrALAZINE  100 mg Oral TID   hydrOXYzine  25 mg Oral Q0600   insulin aspart  0-5 Units Subcutaneous QHS   insulin aspart  0-9 Units  Subcutaneous TID WC   insulin glargine-yfgn  35 Units Subcutaneous Daily   latanoprost  1 drop Both Eyes QHS   mometasone-formoterol  2 puff Inhalation BID   multivitamin with minerals  1 tablet Oral Daily   pantoprazole  40 mg Oral BID AC   polyethylene glycol  17 g Oral BID   senna-docusate  1 tablet Oral BID   thiamine  100 mg Oral  Daily   Or   thiamine  100 mg Intravenous Daily   traZODone  75 mg Oral QHS   Continuous Infusions:     LOS: 8 days    Time spent: 35 mins    Ramiro Harvest, MD [facility]   To contact the attending provider between 7A-7P or the covering provider during after hours 7P-7A, please log into the web site [email or web address] and access using universal [place] password for that web site. If you do not have the password, please call the hospital operator.  05/17/2023, 2:34 PM

## 2023-05-17 NOTE — Plan of Care (Signed)

## 2023-05-18 ENCOUNTER — Other Ambulatory Visit (HOSPITAL_COMMUNITY): Payer: Self-pay

## 2023-05-18 DIAGNOSIS — I739 Peripheral vascular disease, unspecified: Secondary | ICD-10-CM | POA: Diagnosis not present

## 2023-05-18 DIAGNOSIS — K8689 Other specified diseases of pancreas: Secondary | ICD-10-CM | POA: Diagnosis not present

## 2023-05-18 DIAGNOSIS — H409 Unspecified glaucoma: Secondary | ICD-10-CM | POA: Diagnosis not present

## 2023-05-18 DIAGNOSIS — I2510 Atherosclerotic heart disease of native coronary artery without angina pectoris: Secondary | ICD-10-CM | POA: Diagnosis not present

## 2023-05-18 LAB — GLUCOSE, CAPILLARY
Glucose-Capillary: 84 mg/dL (ref 70–99)
Glucose-Capillary: 150 mg/dL — ABNORMAL HIGH (ref 70–99)

## 2023-05-18 LAB — BASIC METABOLIC PANEL
Anion gap: 9 (ref 5–15)
BUN: 31 mg/dL — ABNORMAL HIGH (ref 8–23)
CO2: 20 mmol/L — ABNORMAL LOW (ref 22–32)
Calcium: 9.5 mg/dL (ref 8.9–10.3)
Chloride: 102 mmol/L (ref 98–111)
Creatinine, Ser: 1.63 mg/dL — ABNORMAL HIGH (ref 0.61–1.24)
GFR, Estimated: 47 mL/min — ABNORMAL LOW (ref >60)
Glucose, Bld: 97 mg/dL (ref 70–99)
Potassium: 3.8 mmol/L (ref 3.5–5.1)
Sodium: 131 mmol/L — ABNORMAL LOW (ref 135–145)

## 2023-05-18 NOTE — TOC Transition Note (Addendum)
Transition of Care [facility]) - CM/SW Discharge Note   Patient Details  Name: Stephanos Fan MRN: [id number] Date of Birth: 04-Aug-1961  Transition of Care [facility]) CM/SW Contact:  Epifanio Lesches, RN Phone Number: 05/18/2023, 9:23 AM   Clinical Narrative:    Patient will DC to: home  Anticipated DC date: 05/18/2023 Transport by: car         - S/P R TMA  6/18 Per MD patient ready for DC today . RN,,patient and [facility] notified of DC. [facility] hoping to see pt on tomorrow. Pt states has friend support to assist with care if needed once d/c. DME ordered by provider @ bedside to go home with pt. Pt states  no RX med concerns. TOC pharmacy to deliver RX meds to bedside prior to d/c. Post hospital follow ups noted on AVS.  States friend to provide transportation to home.  RNCM will sign off for now as intervention is no longer needed. Please consult [place] again if new needs arise.   Final next level of care: Home w Home Health Services Barriers to Discharge: No Barriers Identified   Patient Goals and CMS Choice   Choice offered to / list presented to : Patient  Discharge Placement                         Discharge Plan and Services Additional resources added to the After Visit Summary for     Discharge Planning Services: CM Consult            DME Arranged: Walker rolling, Tub bench, Bedside commode DME Agency: [employer] Date DME Agency Contacted: 05/15/23 Time DME Agency Contacted: 1414 Representative spoke with at DME Agency: [facility] Arranged: PT, OT HH Agency: [facility] Date [facility] Agency Contacted: 05/14/23 Time HH Agency Contacted: 1605 Representative spoke with at [facility] Agency: Tresa Endo  Social Determinants of Health (SDOH) Interventions SDOH Screenings   Food Insecurity: No Food Insecurity (05/09/2023)  Housing: Low Risk  (05/09/2023)  Transportation Needs: No Transportation Needs (05/09/2023)  Utilities: Not At Risk (05/09/2023)   Depression (PHQ2-9): High Risk (08/11/2022)  Tobacco Use: Medium Risk (05/13/2023)     Readmission Risk Interventions    04/29/2023    1:09 PM 01/29/2022    2:46 PM 08/24/2020    2:21 PM  Readmission Risk Prevention Plan  Transportation Screening Complete Complete Complete  PCP or Specialist Appt within 3-5 Days  Complete Complete  HRI or Home Care Consult  Complete Complete  Social Work Consult for Recovery Care Planning/Counseling  Complete Complete  Palliative Care Screening  Not Applicable Not Applicable  Medication Review Oceanographer) Complete Complete Complete  HRI or Home Care Consult Complete    SW Recovery Care/Counseling Consult Complete    Palliative Care Screening Not Applicable    Skilled Nursing Facility Not Applicable

## 2023-05-18 NOTE — Discharge Summary (Signed)
Physician Discharge Summary  Jose [name] NFA:[id number] DOB: 1961-06-26 DOA: 05/09/2023  PCP: [name]  Admit date: 05/09/2023 Discharge date: 05/18/2023  Time spent: 60 minutes  Recommendations for Outpatient Follow-up:  Follow-up with [facility] in 2 weeks.  On follow-up patient will need a basic metabolic profile done to follow-up on electrolytes and renal function.  Patient's diabetes and blood pressure need to be reassessed on follow-up. Follow-up with vascular vein specialist 05/29/2023 at 10 AM.   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  Diet recommendation: Heart healthy  Filed Weights   05/09/23 1200  Weight: 81.6 kg    History of present illness:  HPI per Dr Fonda Kinder Junior Jose [name] is a 62 y.o. male with medical history significant of polysubstance abuse (ETOH, cocaine), PVD, CAD s/p stents, stage 3 CKD, COPD, DM, HTN, and HLD presenting with a necrotic foot. He was recently admitted from 5/31-6/6 for dry gangrene and underwent lithotripsy of the L popliteal artery and angioplasty, plan to follow up with vascular surgery in a month to allow necrotic foot to demarcate.  During that visit he was also noted to have an incidental pancreatic tail mass that was suspicion for pancreatic CA and was recommended to have outpatient MRCP.  Pain and discoloration were worsening so he returned to the ER.  He reports that it only involved  the 2/3 toes of the R foot when he was here previously but has extended onto his lateral great toe now with surrounding erythema of the forefoot.  It is very painful, keeping him up at night.  He has continued to drink 2-3 beers nightly since dc and smoke marijuana periodically.  He tries to eat well, has fruit in his oatmeal and thinks this made him sugar go up today.         ER Course:  Needs amputation, toes looking worse from prior admission.  Pain is not well controlled.  No fevers, doesn't look infected.  Underwent recent vascular procedure, seems to be worsening.  Dr. Lenell Antu will consult.     Hospital Course:  #1 gangrene of the right foot/PVD -Patient noted to have been hospitalized for the same issue presented back with worsening symptoms. -During last hospitalization patient underwent successful revascularization. -Patient presented with progressive necrosis, surrounding erythema possible cellulitis. -Patient seen by vascular surgery, noted to be at risk for poor healing and vascular surgery recommended TMA which patient underwent 05/12/2023. -Patient with normal lactate, CRP, sed rate, prealbumin. -Blood cultures with no growth x 5 days. -Plavix was held on admission and resumed as of 05/15/2023.  -Status post 5 days IV Rocephin. -Patient maintained on aspirin, gabapentin, oxycodone, Dilaudid for pain control. -Patient noted to have uncontrolled pain the evening of 05/12/2023 and the morning of 05/13/2023.   -Patient seen by vascular surgery, placed on Dilaudid PCA pump ordered 05/14/2023, however overnight patient felt uncomfortable with Dilaudid PCA pump and asked for it to be discontinued and placed back on  oral pain medications.   -Patient's pain subsequently controlled on oxycodone 10 mg every 4 hours as needed, IV Dilaudid as needed. -Patient underwent dressing changes per vascular surgery on 05/14/2023.   -Patient partial weightbearing to the right foot and heel only.  -It is noted  per vascular surgery that patient has some necrotic edges laterally which will be followed up in the outpatient setting. -Patient placed back on Augmentin on 05/15/2023 while awaiting ID consultation. -Patient seen in consultation by ID who recommended addition of doxycycline 100 mg twice daily with end date of treatment for antibiotics 05/22/2023. -Patient also noted to be complaining of significant pain when dressing changes are done to his foot as well as with working with physical therapy. -Pain medications were recommended to be given prior to dressing changes as well as 30 minutes to an hour prior to physical therapy. -Patient improved clinically, pain was managed and patient will be discharged in stable and improved condition. -Outpatient follow-up with vascular surgery and PCP.   2.  Incidental finding of pancreatic tail lesion/mass -Noted during last hospitalization concerning for pancreatic cancer. -CA 19-9 was 13. -Patient is scheduled for outpatient follow-up with [facility] and for outpatient MRCP. -It is noted that patient had a telephone visit 611 appears to have been referred for MRCP although this does not appear to have been done yet. -Per Dr. Ophelia Charter may need to consider Trousseau syndrome for his ischemic toes (migratory thrombophlebitis associated undiagnosed malignancy), however ischemic toes likely related to chronic uncontrolled diabetes and hypertension. -MRCP ordered which showed multiseptated complex cystic lesion of the tip of the pancreatic tail measuring 3.4 x 2.1 cm unchanged in comparison to prior exams dating back to 04/27/2019.  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  No pancreatic ductal dilatation or surrounding inflammatory changes noted.  Consider additional follow-up in 1 to 2 years or alternatively EUS/FNA for definitive tissue diagnosis.  -Outpatient follow-up.    3.  CKD stage IIIb -Stable.   4.  Acute on chronic hyponatremia -Baseline  sodium 128-130. -At baseline.   5.  Hypomagnesemia -Repleted.   -Magnesium at 2.2.   6.  Diabetes mellitus type 2 -Hemoglobin A1c 7.7 (04/24/2023) -Patient placed on long-acting Semglee 35 units daily during the hospitalization as well as SSI. -Outpatient follow-up with PCP.   7.  Hypertension -BP improved with pain management and on current antihypertensive regimen.   -Patient maintained on home regimen of Norvasc 10 mg daily, Coreg 12.5 mg twice daily, hydralazine 100 mg 3 times daily.  IV hydralazine as needed.     8.  Hyperkalemia/metabolic acidosis -Not receiving iatrogenic potassium. -Patient noted with worsening acidosis in the setting of foot necrosis, no anion gap currently. -Hyperkalemia resolved.  -Metabolic acidosis improved.   -Outpatient follow-up.    9.  Hyperlipidemia -Statin.   10.  COPD -Stable. -Patient maintained on Dulera and albuterol.   11.  Constipation -Patient maintained on a bowel regimen of MiraLAX and Senokot-S.   12.  CAD -Patient was maintained on home regimen of Plavix, aspirin.   13.  Glaucoma -Patient maintained on home regimen brimonidine, brinzolamide, latanoprost.   14.  Insomnia -Patient noted to have reportedly finally slept for the first time 6/17 as pain was better controlled. 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 -Noted not to like trazodone effects. -Patient started on amitriptyline at bedtime on 6/17 to hopefully also help with his neuropathy. -Patient was discharged on amitriptyline, outpatient follow-up with  PCP.       Procedures: ABI 05/09/2023 Right Transmetatarsal amputation 05/12/2023 Per vascular surgery: Dr Edilia Bo 05/12/2023 MRI abdomen 05/14/2023  Consultations: Vascular surgery :Dr Lenell Antu 05/09/2023 ID: Dr.Manandhar 05/16/2023  Discharge Exam: Vitals:   05/18/23 0722 05/18/23 0742  BP: 129/69   Pulse: 72   Resp:    Temp: 98.4 F (36.9 C)   SpO2: 97% 99%    General:  NAD Cardiovascular: RRR no murmurs rubs or gallops.  JVD.  No lower extremity edema. Respiratory: Clear to auscultation bilaterally.  No wheezes, no crackles, no rhonchi.  Fair air movement.  Speaking in full sentences.  Discharge Instructions   Discharge Instructions     Diet - low sodium heart healthy   Complete by: As directed    Increase activity slowly   Complete by: As directed    Increase activity slowly   Complete by: As directed    No wound care   Complete by: As directed    No wound care   Complete by: As directed       Allergies as of 05/18/2023       Reactions   Lisinopril-hydrochlorothiazide Swelling, Other (See Comments)   Causes swelling of lips   Simvastatin Other (See Comments)   ALT (SGPT) level raised, Aspartate aminotransferase serum level raised        Medication List     STOP taking these medications    HYDROmorphone 2 MG tablet Commonly known as: DILAUDID   traZODone 150 MG tablet Commonly known as: DESYREL       TAKE these medications    acetaminophen 325 MG tablet Commonly known as: TYLENOL Take 650 mg by mouth every 4 (four) hours as needed for mild pain, moderate pain or headache.   albuterol 108 (90 Base) MCG/ACT inhaler Commonly known as: VENTOLIN HFA Inhale 2 puffs into the lungs every 6 (six) hours as needed for wheezing or shortness of breath.   Alogliptin Benzoate 25 MG Tabs Take 12.5 mg by mouth daily. For diabetes   amitriptyline 25 MG tablet Commonly known as: ELAVIL Take 1 tablet (25 mg total) by mouth at bedtime.   amLODipine 10 MG tablet Commonly known as: NORVASC Take 1 tablet (10 mg total) by mouth daily.   amoxicillin-clavulanate 875-125 MG tablet Commonly known as: AUGMENTIN Take 1 tablet by mouth every 12 (twelve) hours for 5 days.   aspirin EC 81 MG tablet Take 81 mg by mouth daily. Swallow whole.   atorvastatin 80 MG tablet Commonly known as: LIPITOR Take 1 tablet (80 mg total) by mouth  daily. What changed: when to take this   Brinzolamide-Brimonidine 1-0.2 % Susp Place 1 drop into both eyes 3 (three) times daily.   carvedilol 12.5 MG tablet Commonly known as: COREG Take 12.5 mg by mouth 2 (two) times daily with a meal.   clopidogrel 75 MG tablet Commonly known as: PLAVIX Take 1 tablet (75 mg total) by mouth daily with breakfast.   cyanocobalamin 500 MCG tablet Commonly known as: VITAMIN B12 Take 500 mcg by mouth daily.   Dextrose-Fructose-Sod Citrate 4.35-4.17-0.921 GM/15ML Liqd Take 15 cm by mouth as needed (15cm (1 tube) by mouth as needed for hypoglycemia for  glucose less than 70, repeat every 15 minutes if blood sugar less than 70.).   doxycycline 100 MG tablet Commonly known as: VIBRA-TABS Take 1 tablet (100 mg total) by mouth every 12 (twelve) hours for 5 days.   Fish Oil 1000 MG Caps Take 1,000 mg by  mouth 2 (two) times daily.   fluticasone-salmeterol 500-50 MCG/ACT Aepb Commonly known as: ADVAIR Inhale 1 puff into the lungs in the morning and at bedtime.   folic acid 1 MG tablet Commonly known as: FOLVITE Take 1 tablet (1 mg total) by mouth daily.   furosemide 20 MG tablet Commonly known as: LASIX Take 1 tablet by mouth every other day.   gabapentin 300 MG capsule Commonly known as: NEURONTIN Take 300 mg by mouth every evening.   hydrALAZINE 100 MG tablet Commonly known as: APRESOLINE Take 1 tablet (100 mg total) by mouth 3 (three) times daily. What changed: when to take this   hydrOXYzine 25 MG capsule Commonly known as: VISTARIL Take 25 mg by mouth daily. Itching.   hydrOXYzine 25 MG tablet Commonly known as: ATARAX Take 1 tablet (25 mg total) by mouth 3 (three) times daily as needed for itching or anxiety.   ipratropium-albuterol 0.5-2.5 (3) MG/3ML Soln Commonly known as: DUONEB Take 3 mLs by nebulization every 6 (six) hours as needed (Use 1 vial in nebulizer four times daily).   Lantus 100 UNIT/ML injection Generic drug:  insulin glargine Inject 0.3 mLs (30 Units total) into the skin daily. Expires 28 days after first use What changed: how much to take   latanoprost 0.005 % ophthalmic solution Commonly known as: XALATAN Place 1 drop into both eyes at bedtime.   magnesium oxide 400 (240 Mg) MG tablet Commonly known as: MAG-OX Take 1 tablet (400 mg total) by mouth daily.   methocarbamol 500 MG tablet Commonly known as: ROBAXIN Take 1 tablet (500 mg total) by mouth every 6 (six) hours as needed for muscle spasms.   multivitamin with minerals Tabs tablet Take 1 tablet by mouth daily.   naloxone 4 MG/0.1ML Liqd nasal spray kit Commonly known as: NARCAN Place 1 spray into the nose once. Instill 1 spray into one nostril as directed for opioid overdose, call 911 immediately, administer dose, then turn person on side if no response in 2-3 minutes or person responds but relapses. Repeat using a new spray device and into the other nostril.   nitroGLYCERIN 0.4 MG SL tablet Commonly known as: NITROSTAT Place 1 tablet (0.4 mg total) under the tongue every 5 (five) minutes as needed for chest pain.   omeprazole 40 MG capsule Commonly known as: PRILOSEC Take 40 mg by mouth 2 (two) times daily. Take one an empty stomach 30 minutes prior to a meal.   ondansetron 4 MG tablet Commonly known as: ZOFRAN Take 1 tablet (4 mg total) by mouth every 6 (six) hours as needed for nausea.   Oxycodone HCl 10 MG Tabs Take 1 tablet (10 mg total) by mouth every 4 (four) hours as needed for severe pain. What changed:  medication strength how much to take when to take this reasons to take this   polyethylene glycol 17 g packet Commonly known as: MIRALAX / GLYCOLAX Take 17 g by mouth 2 (two) times daily.   Senexon-S 8.6-50 MG tablet Generic drug: senna-docusate Take 1 tablet by mouth 2 (two) times daily for 14 days.   tadalafil 10 MG tablet Commonly known as: Cialis Take 1 tablet (10 mg total) by mouth daily as needed  for erectile dysfunction.   thiamine 100 MG tablet Commonly known as: Vitamin B-1 Take 1 tablet (100 mg total) by mouth daily.   UltiCare Insulin Syringe 30G X 1/2" 0.5 ML Misc Generic drug: Insulin Syringe-Needle U-100 Use with Lantus  Durable Medical Equipment  (From admission, onward)           Start     Ordered   05/15/23 1412  For home use only DME Bedside commode  Once       Comments: Confined to one room  Question:  Patient needs a bedside commode to treat with the following condition  Answer:  S/P transmetatarsal amputation of foot (HCC)   05/15/23 1412   05/15/23 1408  For home use only DME Walker rolling  Once       Question Answer Comment  Walker: With 5 Inch Wheels   Patient needs a walker to treat with the following condition S/P transmetatarsal amputation of foot (HCC)      05/15/23 1409   05/15/23 1226  For home use only DME Tub bench  Once        05/15/23 1225   05/15/23 1226  For home use only DME 3 n 1  Once        05/15/23 1225           Allergies  Allergen Reactions   Lisinopril-Hydrochlorothiazide Swelling and Other (See Comments)    Causes swelling of lips   Simvastatin Other (See Comments)    ALT (SGPT) level raised, Aspartate aminotransferase serum level raised    Follow-up Information     [facility] Schedule an appointment as soon as possible for a visit in 2 week(s).   Contact information: [address] [facility] [place] [address] [phone]         [facility] Follow up on 05/29/2023.   Why: Hospital followup scheduled for 7/5/204 at 10:00am. Removal of staples arranged @ appointment time Contact information: [address] [place] [address] [phone]                 The results of significant diagnostics from this hospitalization (including imaging, microbiology, ancillary and laboratory) are listed below for reference.     Significant Diagnostic Studies: MR ABDOMEN WO CONTRAST  Result Date: 05/14/2023 CLINICAL DATA:  Pancreatic tail mass suspected EXAM: MRI ABDOMEN WITHOUT CONTRAST TECHNIQUE: Multiplanar multisequence MR imaging was performed without the administration of intravenous contrast. COMPARISON:  CT angiogram runoff, 04/24/2023 CT abdomen, 04/27/2019 FINDINGS: Examination is limited by lack of intravenous contrast. Lower chest: No acute abnormality.  Cardiomegaly. Hepatobiliary: No obvious solid liver abnormality is seen. No gallstones, gallbladder wall thickening, or biliary dilatation. Pancreas: Multiseptated, complex cystic lesion of the tip of the pancreatic tail measuring 3.4 x 2.1 cm. This is unchanged in comparison to prior examinations dating back to 04/27/2019 (series 3, image 27). No pancreatic ductal dilatation or surrounding inflammatory changes. Spleen: Normal in size without significant abnormality. Adrenals/Urinary Tract: Adrenal glands are unremarkable. Kidneys are normal, without renal calculi, obvious solid lesion, or hydronephrosis. Stomach/Bowel: Stomach is within normal limits. No evidence of bowel wall thickening, distention, or inflammatory changes. Vascular/Lymphatic: No significant vascular findings are present. No enlarged abdominal lymph nodes. Other: No abdominal wall hernia or abnormality. No ascites. Musculoskeletal: No acute or significant osseous findings. IMPRESSION: 1. Multiseptated, complex cystic lesion of the tip of the pancreatic tail measuring 3.4 x 2.1 cm. This is unchanged in comparison to prior examinations dating back to 04/27/2019. 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 No pancreatic ductal dilatation or surrounding inflammatory changes. Although well-established stability is very reassuring for benign etiology, given size and complexity, consider additional follow-up in 1-2 years, or alternately  EUS/FNA for definitive tissue  diagnosis. 2. Cardiomegaly. Electronically Signed   By: Jearld Lesch M.D.   On: 05/14/2023 10:11   VAS [place] ABI WITH/WO TBI  Result Date: 05/11/2023  LOWER EXTREMITY DOPPLER STUDY Patient Name:  Jari Favre Junior [name]  Date of Exam:   05/09/2023 Medical Rec #: [id number]           Accession #:    [id number] Date of Birth: Apr 24, 1961           Patient Gender: M Patient Age:   59 years Exam Location:  [facility] Procedure:      VAS [place] ABI WITH/WO TBI Referring Phys: Heath Lark --------------------------------------------------------------------------------  Indications: Ulceration, and gangrene. High Risk Factors: Hypertension, hyperlipidemia, Diabetes.  Vascular Interventions: Status post angioplasty 04/27/23 to right popliteal                         artery. Comparison Study: Prior ABI done 04/24/2023 Performing Technologist: Sherren Kerns RVS  Examination Guidelines: A complete evaluation includes at minimum, Doppler waveform signals and systolic blood pressure reading at the level of bilateral brachial, anterior tibial, and posterior tibial arteries, when vessel segments are accessible. Bilateral testing is considered an integral part of a complete examination. Photoelectric Plethysmograph (PPG) waveforms and toe systolic pressure readings are included as required and additional duplex testing as needed. Limited examinations for reoccurring indications may be performed as noted.  ABI Findings: +---------+------------------+-----+-----------+--------+ Right    Rt Pressure (mmHg)IndexWaveform   Comment  +---------+------------------+-----+-----------+--------+ Brachial 156                    multiphasic         +---------+------------------+-----+-----------+--------+ ATA      254               1.52 multiphasic         +---------+------------------+-----+-----------+--------+ PTA      254               1.52 multiphasic         +---------+------------------+-----+-----------+--------+  Great Toe0                 0.00                     +---------+------------------+-----+-----------+--------+ +---------+------------------+-----+-----------+-------+ Left     Lt Pressure (mmHg)IndexWaveform   Comment +---------+------------------+-----+-----------+-------+ Brachial 167                    triphasic          +---------+------------------+-----+-----------+-------+ ATA      254               1.52 multiphasic        +---------+------------------+-----+-----------+-------+ PTA      254               1.52 multiphasic        +---------+------------------+-----+-----------+-------+ Great Toe0                 0.00                    +---------+------------------+-----+-----------+-------+ +-------+-----------+-----------+------------+------------+ ABI/TBIToday's ABIToday's TBIPrevious ABIPrevious TBI +-------+-----------+-----------+------------+------------+ Right  1.52       absent     1.28        absent       +-------+-----------+-----------+------------+------------+ Left   1.52       absent     1.28  0.37         +-------+-----------+-----------+------------+------------+  Arterial wall calcification precludes accurate ankle pressures and ABIs. Right and Bilateral ABIs appear increased compared to prior study on 04/24/2023. Right TBIs appear unchanged since prior study done 04/24/2023. Left TBIs appear decreased compared to prior study on 04/24/2023.  Summary: Right: Resting right ankle-brachial index indicates noncompressible right lower extremity arteries. The right toe-brachial index is abnormal. Left: Resting left ankle-brachial index indicates noncompressible left lower extremity arteries. The left toe-brachial index is abnormal. *See table(s) above for measurements and observations.  Electronically signed by Sherald Hess MD on 05/11/2023 at 9:22:13 AM.    Final    VAS [place] LOWER EXTREMITY ARTERIAL DUPLEX  Result Date: 05/11/2023 LOWER  EXTREMITY ARTERIAL DUPLEX STUDY Patient Name:  NESTOR WIENEKE Faul  Date of Exam:   05/09/2023 Medical Rec #: [id number]           Accession #:    [id number] Date of Birth: [birth year]           Patient Gender: M Patient Age:   36 years Exam Location:  [facility] Procedure:      VAS [place] LOWER EXTREMITY ARTERIAL DUPLEX Referring Phys: Heath Lark --------------------------------------------------------------------------------  Indications: Ulceration, and gangrene. High Risk Factors: Hypertension, hyperlipidemia, Diabetes.  Vascular Interventions: Angioplasty to right popliteal done 04/27/2023. Current ABI:            non compressible bilaterally with absent toe waveforms Comparison Study: No prior study Performing Technologist: Sherren Kerns RVS  Examination Guidelines: A complete evaluation includes B-mode imaging, spectral Doppler, color Doppler, and power Doppler as needed of all accessible portions of each vessel. Bilateral testing is considered an integral part of a complete examination. Limited examinations for reoccurring indications may be performed as noted.  +----------+--------+-----+--------+-----------+----------------+ RIGHT     PSV cm/sRatioStenosisWaveform   Comments         +----------+--------+-----+--------+-----------+----------------+ CFA Prox  143                  multiphasic                 +----------+--------+-----+--------+-----------+----------------+ DFA       71                   multiphasic                 +----------+--------+-----+--------+-----------+----------------+ SFA Prox  82                   multiphasic                 +----------+--------+-----+--------+-----------+----------------+ SFA Mid   80                   multiphasic                 +----------+--------+-----+--------+-----------+----------------+ SFA Distal85                   multiphasic                 +----------+--------+-----+--------+-----------+----------------+  POP Prox  45                   multiphasic                 +----------+--------+-----+--------+-----------+----------------+ POP Distal61                   multiphasic                 +----------+--------+-----+--------+-----------+----------------+  ATA Prox  26                   monophasic                  +----------+--------+-----+--------+-----------+----------------+ ATA Mid   73                   monophasic                  +----------+--------+-----+--------+-----------+----------------+ ATA Distal28                   monophasic                  +----------+--------+-----+--------+-----------+----------------+ PTA Prox  65                   monophasic                  +----------+--------+-----+--------+-----------+----------------+ PTA Mid   56                   monophasic                  +----------+--------+-----+--------+-----------+----------------+ PTA Distal31                   monophasic collateral noted +----------+--------+-----+--------+-----------+----------------+   Summary: Right: Calcified but patent vessels noted throughout with multiphasic waveforms in the CFA, DFA, SFA, and Popliteal arteries and monophasic flow in the posterior tibial and anterior tibial arteries.  See table(s) above for measurements and observations. Electronically signed by Sherald Hess MD on 05/11/2023 at 9:22:00 AM.    Final    PERIPHERAL VASCULAR CATHETERIZATION  Result Date: 04/27/2023 Images from the original result were not included. 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 Randie Heinz, MD Procedure Performed: 1.  Ultrasound-guided cannulation and Mynx device closure left common femoral artery 2.  Catheter in aorta and bilateral lower extremity runoff 3.  Selection with  catheter of right popliteal artery 4.  6 x 60 mm shockwave lithotripsy right popliteal artery 5.  Drug-coated balloon angioplasty 6 x 40 mm Ranger right popliteal artery 6.  Moderate sedation with fentanyl and Versed for 69 minutes Indications: 62 year old male with chronic right lower extremity limb threatening ischemia with dampened monophasic ABIs and 0 toe pressure on the right with a palpable popliteal pulse and nothing palpable below.  He is now indicated for angiography with possible intervention. Findings: His aorta and bilateral common external iliac arteries were patent as are the bilateral common femoral arteries.  He does have a steep aortic bifurcation.  The right SFA is calcified but there is no evidence of any stenosis.  Just below the knee joint there is heavy calcification of the popliteal artery it is subtotally occluded.  After shockwave lithotripsy and drug-coated balloon angioplasty this is reduced to 0% stenosis where previously was greater than 90% stenosis with heavy calcification.  The dominant runoff on the right side is via the peroneal artery which gives rise to the plantar vessels that are posterior tibial base.  There is posterior tibial artery runoff but this occludes above the ankle and the anterior tibial artery is occluded much higher in the leg.  At completion there is a multiphasic peroneal artery signal at the ankle.  Procedure:  The patient was identified in the holding  area and taken to room 8.  The patient was then placed supine on the table and prepped and draped in the usual sterile fashion.  A time out was called.  Ultrasound was used to evaluate the left common femoral artery which was noted to be patent and the area was anesthetized with 1% lidocaine and cannulated with micropuncture needle followed by wire and a sheath.  And images saved to the permanent record.  Concomitantly we administered fentanyl and Versed for moderate sedation and his vital signs were monitored  throughout the case.  We placed a Bentson wire followed by 5 [place] sheath and Omni catheter to the level of L1 and aortogram was performed followed by limited bilateral lower extremity angiography to evaluate the bilateral common femoral arteries bilateral common and external iliac arteries.  We then crossed the bifurcation using a Glidewire and the Omni catheter and perform right lower extremity angiography and then used a Glidewire and quick cross catheter to the level of the knee to perform angiography below the knee and identify the tibial runoff.  With the above findings we placed a long 6 French sheath over a Glidewire advantage the patient was fully heparinized.  We then used a quick cross catheter and Glidewire advantage were able to cross the subtotally occluded popliteal artery and confirmed intraluminal access.  We placed an 014 wire and then performed lithotripsy with a shockwave 6 x 60 mm balloon for a total of 300 pulses.  Completion angiography demonstrated minimal residual stenosis and the calcium had significantly improved.  We then primarily performed drug-coated balloon angioplasty with a 6 x 40 mm Ranger at nominal pressure.  Completion demonstrated no residual stenosis no dissection's.  Runoff was via the peroneal artery there is possibly a 30% stenosis at the takeoff of the tibioperoneal trunk this is not treated.  We exchanged for a short 6 French sheath over a Bentson wire and deployed a minx device.  Patient tolerated procedure without any complication. Contrast: 105cc Brandon C. Randie Heinz, MD [facility] Office: [phone] Pager: [phone]  VAS [place] ABI WITH/WO TBI  Result Date: 04/25/2023  LOWER EXTREMITY DOPPLER STUDY Patient Name:  Jari Favre Junior [name]  Date of Exam:   04/24/2023 Medical Rec #: [id number]           Accession #:    [id number] Date of Birth: Mar 27, 1961           Patient Gender: M Patient Age:   3 years Exam Location:  [facility]  Procedure:      VAS [place] ABI WITH/WO TBI Referring Phys: Sherald Hess --------------------------------------------------------------------------------  Indications: Peripheral artery disease. High Risk         Hypertension, hyperlipidemia, Diabetes, coronary artery Factors:          disease.  Comparison Study: No previous study. Performing Technologist: McKayla Maag RVT, [place]  Examination Guidelines: A complete evaluation includes at minimum, Doppler waveform signals and systolic blood pressure reading at the level of bilateral brachial, anterior tibial, and posterior tibial arteries, when vessel segments are accessible. Bilateral testing is considered an integral part of a complete examination. Photoelectric Plethysmograph (PPG) waveforms and toe systolic pressure readings are included as required and additional duplex testing as needed. Limited examinations for reoccurring indications may be performed as noted.  ABI Findings: +---------+------------------+-----+------------------+------------------------+ Right    Rt Pressure (mmHg)IndexWaveform          Comment                  +---------+------------------+-----+------------------+------------------------+  Brachial 199                    biphasic                                   +---------+------------------+-----+------------------+------------------------+ PTA      255               1.28 monophasic        Non-compressible         +---------+------------------+-----+------------------+------------------------+ DP                              dampened          Obtained with 2D imaging                                 monophasic                                 +---------+------------------+-----+------------------+------------------------+ Great Toe                       Absent                                     +---------+------------------+-----+------------------+------------------------+  +---------+------------------+-----+----------+--------------------------------+ Left     Lt Pressure (mmHg)IndexWaveform  Comment                          +---------+------------------+-----+----------+--------------------------------+ Brachial                        biphasic  No pressure obtained due to IV                                             placement.                       +---------+------------------+-----+----------+--------------------------------+ PTA      255               1.28 monophasicNon-compressible                 +---------+------------------+-----+----------+--------------------------------+ DP       255               1.28 monophasicNon-compressible                 +---------+------------------+-----+----------+--------------------------------+ Great Toe73                0.37 Abnormal                                   +---------+------------------+-----+----------+--------------------------------+ +-------+----------------+-----------+------------+------------+ ABI/TBIToday's ABI     Today's TBIPrevious ABIPrevious TBI +-------+----------------+-----------+------------+------------+ Right  Non-compressibe Absent                              +-------+----------------+-----------+------------+------------+ Left   Non-compressible0.37                                +-------+----------------+-----------+------------+------------+  Summary: Right: Resting right ankle-brachial index indicates noncompressible right lower extremity arteries. The right toe-brachial index is abnormal. Left: Resting left ankle-brachial index indicates noncompressible left lower extremity arteries. The left toe-brachial index is abnormal. *See table(s) above for measurements and observations.  Electronically signed by Sherald Hess MD on 04/25/2023 at 9:37:19 AM.    Final    CT Angio Aortobifemoral W and/or Wo Contrast  Result Date: 04/24/2023 CLINICAL DATA:   Claudication or leg ischemia. Toes turned black after wearing new shoes. EXAM: CT ANGIOGRAPHY OF ABDOMINAL AORTA WITH ILIOFEMORAL RUNOFF TECHNIQUE: Multidetector CT imaging of the abdomen, pelvis and lower extremities was performed using the standard protocol during bolus administration of intravenous contrast. Multiplanar CT image reconstructions and MIPs were obtained to evaluate the vascular anatomy. RADIATION DOSE REDUCTION: This exam was performed according to the departmental dose-optimization program which includes automated exposure control, adjustment of the mA and/or kV according to patient size and/or use of iterative reconstruction technique. CONTRAST:  OMNIPAQUE IOHEXOL 350 MG/ML SOLN COMPARISON:  CT abdomen pelvis 04/27/2019 FINDINGS: VASCULAR Aorta: Mild calcified atheromatous plaque without aneurysm or flow-limiting stenosis. Celiac: Patent without evidence of aneurysm, dissection, vasculitis or significant stenosis. SMA: Patent without evidence of aneurysm, dissection, vasculitis or significant stenosis. Renals: Both renal arteries are patent without evidence of aneurysm, dissection, vasculitis, fibromuscular dysplasia or significant stenosis. IMA: Patent without evidence of aneurysm, dissection, vasculitis or significant stenosis. RIGHT Lower Extremity Inflow: Common, internal and external iliac arteries are patent without evidence of aneurysm, dissection, vasculitis or significant stenosis. Outflow: Minimal calcified and noncalcified atheromatous plaque of the right common femoral artery without significant stenosis. Superficial femoral and profunda femoris arteries are diffusely calcified without significant narrowing. Moderate to severe focal stenosis of the right popliteal artery best seen on image 444 of series 5. Runoff: Lower leg arteries are diffusely calcified which limits the ability to determine if they are opacified. The peroneal artery appears patent. Anterior and posterior tibial  arteries are likely occluded. LEFT Lower Extremity Inflow: Common, internal and external iliac arteries are patent without evidence of aneurysm, dissection, vasculitis or significant stenosis. Outflow: No significant stenosis of the common femoral artery. Superficial femoral, profundus femoris, and popliteal arteries are diffusely calcified but patent without significant stenosis. Runoff: Lower leg arteries are diffusely calcified which limits evaluation for opacification. The peroneal artery is patent to the level of the ankle. Unable to definitively determine if anterior and posterior tibial arteries are patent are occluded. Veins: No obvious venous abnormality within the limitations of this arterial phase study. Review of the MIP images confirms the above findings. NON-VASCULAR Lower chest: No acute abnormality. Extensive coronary artery calcifications. Hepatobiliary: No focal liver abnormality is seen. No gallstones, gallbladder wall thickening, or biliary dilatation. Pancreas: 3.5 x 2.3 cm pancreatic tail mass (image 75, series 5) requires further evaluation with pancreatic mass protocol CT or MRI. No pancreatic duct dilatation. Spleen: Normal in size without focal abnormality. Adrenals/Urinary Tract: Adrenal glands are normal. 3 mm nonobstructing calculus present in the lower pole of the right kidney. Stomach/Bowel: Stomach is within normal limits. Appendix appears normal. No evidence of bowel wall thickening, distention, or inflammatory changes. Lymphatic: No enlarged abdominal or pelvic lymph nodes. Reproductive: Prostate is unremarkable. Other: No abdominal wall hernia or abnormality. No abdominopelvic ascites. Musculoskeletal: Posttraumatic changes of the right mid tibia and fibula consistent with prior healed fractures. Right 10 mm loose body seen in the left knee joint. Tibial intramedullary rod is intact. Deformity of proximal left tibia and fibula  consistent with remote healed fractures. IMPRESSION: 1.  Extensive diffuse atherosclerotic calcification of the lower leg arteries. 2. 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 3. Lower leg arteries are diffusely calcified which limits evaluation for opacification. There is at least single-vessel runoff to both ankles through the peroneal arteries. Unable to definitively determine if anterior and posterior tibial arteries are opacified. 4. 3.5 x 2.3 cm pancreatic tail mass requires further evaluation with nonemergent pancreatic mass protocol CT or MRI. 5. 3 mm nonobstructing calculus in the lower pole of the right kidney. Electronically Signed   By: Acquanetta Belling M.D.   On: 04/24/2023 13:00   DG Foot Complete Right  Result Date: 04/24/2023 CLINICAL DATA:  Right foot pain for 1 month. EXAM: RIGHT FOOT COMPLETE - 3+ VIEW COMPARISON:  None Available. FINDINGS: There is no evidence of acute fracture or dislocation. There is no evidence of arthropathy. Vascular calcifications are noted. Status post intramedullary rod fixation of right tibia. IMPRESSION: No acute abnormality seen. Electronically Signed   By: Lupita Raider M.D.   On: 04/24/2023 09:20    Microbiology: Recent Results (from the past 240 hour(s))  Blood Cultures x 2 sites     Status: None   Collection Time: 05/09/23  4:52 PM   Specimen: BLOOD  Result Value Ref Range Status   Specimen Description BLOOD BLOOD RIGHT HAND  Final   Special Requests   Final    BOTTLES DRAWN AEROBIC AND ANAEROBIC Blood Culture adequate volume   Culture   Final    NO GROWTH 5 DAYS Performed at [facility], [address], [place], [place] [address]    Report Status 05/14/2023 FINAL  Final  Blood Cultures x 2 sites     Status: None   Collection Time: 05/09/23  4:52 PM   Specimen: BLOOD  Result Value Ref Range Status   Specimen Description BLOOD BLOOD LEFT ARM  Final   Special Requests   Final    BOTTLES DRAWN AEROBIC AND ANAEROBIC Blood Culture adequate volume   Culture   Final    NO  GROWTH 5 DAYS Performed at [facility], [address], [place], [place] [address]    Report Status 05/14/2023 FINAL  Final  MRSA Next Gen by PCR, Nasal     Status: None   Collection Time: 05/11/23  6:21 PM   Specimen: Nasal Mucosa; Nasal Swab  Result Value Ref Range Status   MRSA by PCR Next Gen NOT DETECTED NOT DETECTED Final    Comment: (NOTE) The GeneXpert MRSA Assay (FDA approved for NASAL specimens only), is one component of a comprehensive MRSA colonization surveillance program. It is not intended to diagnose MRSA infection nor to guide or monitor treatment for MRSA infections. Test performance is not FDA approved in patients less than 7 years old. Performed at [facility], [address], [place], [place] [address]      Labs: Basic Metabolic Panel: Recent Labs  Lab 05/12/23 0140 05/13/23 0159 05/14/23 0120 05/15/23 0630 05/16/23 0333 05/17/23 0129 05/18/23 0442  NA 132* 129* 130* 130* 131* 130* 131*  K 4.1 4.2 3.7 4.0 3.7 3.9 3.8  CL 103 101 100 98 101 101 102  CO2 21* 17* 19* 19* 20* 21* 20*  GLUCOSE 170* 305* 219* 184* 140* 167* 97  BUN 21 30* 25* 29* 32* 36* 31*  CREATININE 1.63* 1.79* 1.39* 1.69* 1.76* 1.73* 1.63*  CALCIUM 8.9 8.6* 9.1 9.1 9.0  9.2 9.5  MG 1.7 1.6* 2.3  --   --  2.2  --    Liver Function Tests: No results for input(s): "AST", "ALT", "ALKPHOS", "BILITOT", "PROT", "ALBUMIN" in the last 168 hours. No results for input(s): "LIPASE", "AMYLASE" in the last 168 hours. No results for input(s): "AMMONIA" in the last 168 hours. CBC: Recent Labs  Lab 05/12/23 0140 05/13/23 0159 05/14/23 0120 05/15/23 0743 05/16/23 0333 05/17/23 0129  WBC 7.4 8.0 13.3* 11.1* 9.9 8.8  NEUTROABS 4.1 6.3 10.2* 7.4  --  5.8  HGB 11.5* 11.8* 13.5 12.2* 11.3* 11.4*  HCT 32.3* 32.3* 37.2* 34.2* 31.7* 31.0*  MCV 76.9* 77.3* 75.8* 75.7* 76.2* 76.4*  PLT 224 209 259 205 199 198   Cardiac Enzymes: No results for input(s): "CKTOTAL", "CKMB",  "CKMBINDEX", "TROPONINI" in the last 168 hours. BNP: BNP (last 3 results) Recent Labs    10/10/22 2034  BNP 117.7*    ProBNP (last 3 results) No results for input(s): "PROBNP" in the last 8760 hours.  CBG: Recent Labs  Lab 05/17/23 0729 05/17/23 1203 05/17/23 1639 05/17/23 2254 05/18/23 0723  GLUCAP 106* 197* 167* 143* 84       Signed:  Ramiro Harvest MD.  [facility] 05/18/2023, 10:13 AM

## 2023-05-18 NOTE — Progress Notes (Signed)
Patient states: "I did not come to the hospital with any medications"

## 2023-05-18 NOTE — Discharge Planning (Signed)
Patient alert and oriented. IV access removed. Discharge teaching given. Patient's home health set up per case manager, Marylene Land. DME delivered to bedside. TOC medications given to patient. Patient transported to lobby for private transportation home via relative.

## 2023-05-18 NOTE — Progress Notes (Signed)
Physical Therapy Treatment Patient Details Name: Jose [name] MRN: [id number] DOB: June 27, 1961 Today's Date: 05/18/2023   History of Present Illness 62 y.o. male admitted 6/15 with ulceration of the right foot. Underwent TRANSMETATARSAL AMPUTATION 6/18. 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 medical history significant of CAD status post stenting x 2, CKD stage II, COPD, IDDM, HTN.    PT Comments    Focused on short bout of gt in room.  Once sitting PTA educated patient on how to manipulate and set up bedside commode for home use.  He was able to teach back method and returned to supine.  Pt resting comfortably at end of session.     Recommendations for follow up therapy are one component of a multi-disciplinary discharge planning process, led by the attending physician.  Recommendations may be updated based on patient status, additional functional criteria and insurance authorization.  Follow Up Recommendations       Assistance Recommended at Discharge Set up Supervision/Assistance  Patient can return home with the following A little help with walking and/or transfers;Assist for transportation;Help with stairs or ramp for entrance   Equipment Recommendations  Rolling walker (2 wheels);BSC/3in1    Recommendations for Other Services       Precautions / Restrictions Precautions Precautions: Fall Required Braces or Orthoses: Other Brace Other Brace: Darco shoe Restrictions Weight Bearing Restrictions: Yes RLE Weight Bearing: Partial weight bearing RLE Partial Weight Bearing Percentage or Pounds: Through heel only with Darco shoe Other Position/Activity Restrictions: PWB through heel only on Rt     Mobility  Bed Mobility Overal bed mobility: Modified Independent             General bed mobility comments: No assist needed, extra time    Transfers   Equipment used: Rolling walker (2 wheels) Transfers: Sit to/from  Stand Sit to Stand: Supervision           General transfer comment: Cues for hand placement, poor eccentric load this session.    Ambulation/Gait Ambulation/Gait assistance: Supervision Gait Distance (Feet): 16 Feet Assistive device: Rolling walker (2 wheels) Gait Pattern/deviations: Step-to pattern, Decreased dorsiflexion - right Gait velocity: decr     General Gait Details: Reviewed RW use and sequencing. Safely maintains PWB on Rt through heel only, mostly NWB with gait. Stable without evidence of buckling. Declines further distance at this time d/t drowsiness from pain meds.   Stairs         General stair comments: declined stair training this am, reports he feels confident to enter his home.   Wheelchair Mobility    Modified Rankin (Stroke Patients Only)       Balance Overall balance assessment: Needs assistance Sitting-balance support: Feet supported Sitting balance-Leahy Scale: Normal       Standing balance-Leahy Scale: Poor Standing balance comment: RW                            Cognition Arousal/Alertness: Awake/alert Behavior During Therapy: WFL for tasks assessed/performed Overall Cognitive Status: Within Functional Limits for tasks assessed                                          Exercises      General Comments        Pertinent Vitals/Pain Pain Assessment Pain Score: 7  Pain Location:  left foot Pain Descriptors / Indicators: Aching, Burning Pain Intervention(s): Monitored during session, Repositioned    Home Living                          Prior Function            PT Goals (current goals can now be found in the care plan section) Acute Rehab PT Goals Patient Stated Goal: Get well Potential to Achieve Goals: Good Progress towards PT goals: Progressing toward goals    Frequency    Min 3X/week      PT Plan Current plan remains appropriate    Co-evaluation               AM-PAC PT "6 Clicks" Mobility   Outcome Measure  Help needed turning from your back to your side while in a flat bed without using bedrails?: None Help needed moving from lying on your back to sitting on the side of a flat bed without using bedrails?: None Help needed moving to and from a bed to a chair (including a wheelchair)?: A Little Help needed standing up from a chair using your arms (e.g., wheelchair or bedside chair)?: A Little Help needed to walk in hospital room?: A Little Help needed climbing 3-5 steps with a railing? : A Little 6 Click Score: 20    End of Session Equipment Utilized During Treatment: Gait belt;Other (comment) (R darco shoe)   Patient left: in bed;with call bell/phone within reach Nurse Communication: Mobility status PT Visit Diagnosis: Pain Pain - Right/Left: Right Pain - part of body: Ankle and joints of foot     Time: 2440-1027 PT Time Calculation (min) (ACUTE ONLY): 13 min  Charges:  $Gait Training: 8-22 mins                     Bonney Leitz , PTA Acute Rehabilitation Services Office [phone]    Florestine Avers 05/18/2023, 9:48 AM

## 2023-05-19 ENCOUNTER — Telehealth: Payer: Self-pay | Admitting: Physician Assistant

## 2023-05-19 NOTE — Telephone Encounter (Signed)
-----   Message from Emilie Rutter, PA-C sent at 05/15/2023  7:40 AM EDT -----  Incision check with PA on [place] day in 2-3 weeks.  PO R TMA Thanks

## 2023-05-23 ENCOUNTER — Encounter (HOSPITAL_COMMUNITY): Payer: Self-pay

## 2023-05-23 ENCOUNTER — Other Ambulatory Visit: Payer: Self-pay

## 2023-05-23 ENCOUNTER — Emergency Department (HOSPITAL_COMMUNITY): Payer: No Typology Code available for payment source

## 2023-05-23 ENCOUNTER — Emergency Department (HOSPITAL_COMMUNITY)
Admission: EM | Admit: 2023-05-23 | Discharge: 2023-05-23 | Disposition: A | Discharge status: Home / Self Care | Payer: Medicare HMO | Attending: Emergency Medicine | Admitting: Emergency Medicine

## 2023-05-23 DIAGNOSIS — I2510 Atherosclerotic heart disease of native coronary artery without angina pectoris: Secondary | ICD-10-CM | POA: Diagnosis not present

## 2023-05-23 DIAGNOSIS — Z79899 Other long term (current) drug therapy: Secondary | ICD-10-CM | POA: Insufficient documentation

## 2023-05-23 DIAGNOSIS — I129 Hypertensive chronic kidney disease with stage 1 through stage 4 chronic kidney disease, or unspecified chronic kidney disease: Secondary | ICD-10-CM | POA: Diagnosis not present

## 2023-05-23 DIAGNOSIS — Z7982 Long term (current) use of aspirin: Secondary | ICD-10-CM | POA: Insufficient documentation

## 2023-05-23 DIAGNOSIS — R531 Weakness: Secondary | ICD-10-CM | POA: Insufficient documentation

## 2023-05-23 DIAGNOSIS — Z794 Long term (current) use of insulin: Secondary | ICD-10-CM | POA: Insufficient documentation

## 2023-05-23 DIAGNOSIS — E119 Type 2 diabetes mellitus without complications: Secondary | ICD-10-CM | POA: Diagnosis not present

## 2023-05-23 DIAGNOSIS — Z7901 Long term (current) use of anticoagulants: Secondary | ICD-10-CM | POA: Insufficient documentation

## 2023-05-23 DIAGNOSIS — N183 Chronic kidney disease, stage 3 unspecified: Secondary | ICD-10-CM | POA: Insufficient documentation

## 2023-05-23 DIAGNOSIS — J449 Chronic obstructive pulmonary disease, unspecified: Secondary | ICD-10-CM | POA: Insufficient documentation

## 2023-05-23 LAB — COMPREHENSIVE METABOLIC PANEL
ALT: 37 U/L (ref 0–44)
AST: 26 U/L (ref 15–41)
Albumin: 3.5 g/dL (ref 3.5–5.0)
Alkaline Phosphatase: 79 U/L (ref 38–126)
Anion gap: 19 — ABNORMAL HIGH (ref 5–15)
BUN: 27 mg/dL — ABNORMAL HIGH (ref 8–23)
CO2: 18 mmol/L — ABNORMAL LOW (ref 22–32)
Calcium: 10.2 mg/dL (ref 8.9–10.3)
Chloride: 97 mmol/L — ABNORMAL LOW (ref 98–111)
Creatinine, Ser: 1.88 mg/dL — ABNORMAL HIGH (ref 0.61–1.24)
GFR, Estimated: 39 mL/min — ABNORMAL LOW (ref >60)
Glucose, Bld: 226 mg/dL — ABNORMAL HIGH (ref 70–99)
Potassium: 4.1 mmol/L (ref 3.5–5.1)
Sodium: 134 mmol/L — ABNORMAL LOW (ref 135–145)
Total Bilirubin: 0.5 mg/dL (ref 0.3–1.2)
Total Protein: 6.8 g/dL (ref 6.5–8.1)

## 2023-05-23 LAB — TROPONIN I (HIGH SENSITIVITY)
Troponin I (High Sensitivity): 17 ng/L (ref <18)
Troponin I (High Sensitivity): 18 ng/L — ABNORMAL HIGH (ref <18)

## 2023-05-23 LAB — CBG MONITORING, ED: Glucose-Capillary: 167 mg/dL — ABNORMAL HIGH (ref 70–99)

## 2023-05-23 LAB — CBC WITH DIFFERENTIAL/PLATELET
Abs Immature Granulocytes: 0.05 10*3/uL (ref 0.00–0.07)
Basophils Absolute: 0 10*3/uL (ref 0.0–0.1)
Basophils Relative: 1 %
Eosinophils Absolute: 0.1 10*3/uL (ref 0.0–0.5)
Eosinophils Relative: 1 %
HCT: 35.8 % — ABNORMAL LOW (ref 39.0–52.0)
Hemoglobin: 12.7 g/dL — ABNORMAL LOW (ref 13.0–17.0)
Immature Granulocytes: 1 %
Lymphocytes Relative: 25 %
Lymphs Abs: 2.1 10*3/uL (ref 0.7–4.0)
MCH: 27.2 pg (ref 26.0–34.0)
MCHC: 35.5 g/dL (ref 30.0–36.0)
MCV: 76.7 fL — ABNORMAL LOW (ref 80.0–100.0)
Monocytes Absolute: 0.6 10*3/uL (ref 0.1–1.0)
Monocytes Relative: 7 %
Neutro Abs: 5.5 10*3/uL (ref 1.7–7.7)
Neutrophils Relative %: 65 %
Platelets: 265 10*3/uL (ref 150–400)
RBC: 4.67 MIL/uL (ref 4.22–5.81)
RDW: 13.1 % (ref 11.5–15.5)
WBC: 8.4 10*3/uL (ref 4.0–10.5)
nRBC: 0 % (ref 0.0–0.2)

## 2023-05-23 MED ORDER — OXYCODONE-ACETAMINOPHEN 5-325 MG PO TABS
1.0000 | ORAL_TABLET | Freq: Once | ORAL | Status: AC
  Administered 2023-05-23: 1 via ORAL
  Filled 2023-05-23: qty 1

## 2023-05-23 MED ORDER — SODIUM CHLORIDE 0.9 % IV BOLUS
1000.0000 mL | Freq: Once | INTRAVENOUS | Status: AC
  Administered 2023-05-23: 1000 mL via INTRAVENOUS

## 2023-05-23 MED ORDER — OXYCODONE-ACETAMINOPHEN 5-325 MG PO TABS: 1.0000 | ORAL_TABLET | Freq: Four times a day (QID) | ORAL | 0 refills | Status: DC | PRN

## 2023-05-23 NOTE — ED Notes (Signed)
Please call niece  [phone] quinetta before discharging  she needs to speak Child psychotherapist

## 2023-05-23 NOTE — ED Notes (Signed)
Pt was able to call for transportation and stated his niece will meet him at his house.

## 2023-05-23 NOTE — ED Provider Notes (Signed)
[facility]MENT AT [facility] Provider Note   CSN: [id number] Arrival date & time: 05/23/23  1043     History  Chief Complaint  Patient presents with   Near Syncope    Jose [name] is a 62 y.o. male with a past medical history of CAD, CKD, diabetes, hypertension, diabetic foot ulcer status post right toe amputation presents today for evaluation of weakness.  Patient was discharged on 6/25 after having a right toe amputation.  States that since then he has not been able to walk.  He states he has not eaten anything in 2 days.  He states he has access to food at home but cannot fix or go get the food himself.  States he would like to talk to a Child psychotherapist to help with his situation.  He is living at home by himself and does not have family in town.  He denies any fever, chest pain, abdominal pain, bowel change, urinary symptoms, blood in his stool or urine.   Near Syncope    Past Medical History:  Diagnosis Date   Alcohol abuse    CAD (coronary artery disease)    a. Reported MI in 2012 s/p 2 stents;  b. 08/2012 Cath: LM 20, LAD 20 diff ISR, jailed septal - 99%, LCX 30ost, RI large, min irregs, RCA 30p, 20-30 ISR-->Med Rx; c. 2017 [facility].   CKD (chronic kidney disease), stage III (HCC)    Cocaine abuse (HCC)    COPD (chronic obstructive pulmonary disease) (HCC)    Depression    Diabetes mellitus without complication (HCC)    Hyperlipidemia    Hypertension    Hypertensive urgency 02/11/2021   Noncompliance    Past Surgical History:  Procedure Laterality Date   ABDOMINAL AORTOGRAM W/LOWER EXTREMITY N/A 04/27/2023   Procedure: ABDOMINAL AORTOGRAM W/LOWER EXTREMITY;  Surgeon: Maeola Harman, MD;  Location: [facility] INVASIVE CV LAB;  Service: Cardiovascular;  Laterality: N/A;   CARDIAC CATHETERIZATION     CORONARY STENT PLACEMENT     LEFT HEART CATH AND CORONARY ANGIOGRAPHY N/A 08/22/2020   Procedure: LEFT HEART CATH AND  CORONARY ANGIOGRAPHY;  Surgeon: Lyn Records, MD;  Location: [facility] INVASIVE CV LAB;  Service: Cardiovascular;  Laterality: N/A;   LEFT HEART CATH AND CORONARY ANGIOGRAPHY N/A 01/28/2022   Procedure: LEFT HEART CATH AND CORONARY ANGIOGRAPHY;  Surgeon: Lennette Bihari, MD;  Location: [facility] INVASIVE CV LAB;  Service: Cardiovascular;  Laterality: N/A;   LEFT HEART CATHETERIZATION WITH CORONARY ANGIOGRAM Bilateral 08/25/2012   Procedure: LEFT HEART CATHETERIZATION WITH CORONARY ANGIOGRAM;  Surgeon: Kathleene Hazel, MD;  Location: [facility] CATH LAB;  Service: Cardiovascular;  Laterality: Bilateral;   PERIPHERAL INTRAVASCULAR LITHOTRIPSY  04/27/2023   Procedure: PERIPHERAL INTRAVASCULAR LITHOTRIPSY;  Surgeon: Maeola Harman, MD;  Location: [facility] INVASIVE CV LAB;  Service: Cardiovascular;;   PERIPHERAL VASCULAR BALLOON ANGIOPLASTY  04/27/2023   Procedure: PERIPHERAL VASCULAR BALLOON ANGIOPLASTY;  Surgeon: Maeola Harman, MD;  Location: [facility] INVASIVE CV LAB;  Service: Cardiovascular;;   TRANSMETATARSAL AMPUTATION Right 05/12/2023   Procedure: TRANSMETATARSAL AMPUTATION;  Surgeon: Chuck Hint, MD;  Location: [facility] OR;  Service: Vascular;  Laterality: Right;     Home Medications Prior to Admission medications   Medication Sig Start Date End Date Taking? Authorizing Provider  acetaminophen (TYLENOL) 325 MG tablet Take 650 mg by mouth every 4 (four) hours as needed for mild pain, moderate pain or headache.    [provider]  albuterol (PROVENTIL HFA;VENTOLIN HFA) 108 (90 BASE) MCG/ACT inhaler Inhale 2 puffs into the lungs every 6 (six) hours as needed for wheezing or shortness of breath.     [provider]  Alogliptin Benzoate 25 MG TABS Take 12.5 mg by mouth daily. For diabetes    [provider]  amitriptyline (ELAVIL) 25 MG tablet Take 1 tablet (25 mg total) by mouth at bedtime. 05/15/23   Rodolph Bong, MD  amLODipine (NORVASC) 10 MG tablet Take 1 tablet (10  mg total) by mouth daily. 03/12/21   [name], Deloris Ping, MD  aspirin EC 81 MG tablet Take 81 mg by mouth daily. Swallow whole.    [provider]  atorvastatin (LIPITOR) 80 MG tablet Take 1 tablet (80 mg total) by mouth daily. Patient taking differently: Take 80 mg by mouth every evening. 01/30/22   Marjie Skiff E, PA-C  Brinzolamide-Brimonidine 1-0.2 % SUSP Place 1 drop into both eyes 3 (three) times daily.    [provider]  carvedilol (COREG) 12.5 MG tablet Take 12.5 mg by mouth 2 (two) times daily with a meal.    [provider]  clopidogrel (PLAVIX) 75 MG tablet Take 1 tablet (75 mg total) by mouth daily with breakfast. 01/30/22   Marjie Skiff E, PA-C  cyanocobalamin (VITAMIN B12) 500 MCG tablet Take 500 mcg by mouth daily.    [provider]  Dextrose-Fructose-Sod Citrate 4.35-4.17-0.921 GM/15ML LIQD Take 15 cm by mouth as needed (15cm (1 tube) by mouth as needed for hypoglycemia for  glucose less than 70, repeat every 15 minutes if blood sugar less than 70.).    [provider]  fluticasone-salmeterol (ADVAIR) 500-50 MCG/ACT AEPB Inhale 1 puff into the lungs in the morning and at bedtime. 04/30/23   Lanae Boast, MD  folic acid (FOLVITE) 1 MG tablet Take 1 tablet (1 mg total) by mouth daily. 05/16/23   Rodolph Bong, MD  furosemide (LASIX) 20 MG tablet Take 1 tablet by mouth every other day. 09/21/20   [provider]  gabapentin (NEURONTIN) 300 MG capsule Take 300 mg by mouth every evening.    [provider]  hydrALAZINE (APRESOLINE) 100 MG tablet Take 1 tablet (100 mg total) by mouth 3 (three) times daily. Patient taking differently: Take 100 mg by mouth 2 (two) times daily. 03/12/21   [name], Deloris Ping, MD  hydrOXYzine (ATARAX) 25 MG tablet Take 1 tablet (25 mg total) by mouth 3 (three) times daily as needed for itching or anxiety. 05/15/23   Rodolph Bong, MD  hydrOXYzine (VISTARIL) 25 MG capsule Take 25 mg by mouth daily.  Itching.    [provider]  insulin glargine (LANTUS) 100 UNIT/ML injection Inject 0.3 mLs (30 Units total) into the skin daily. Expires 28 days after first use 05/15/23   Rodolph Bong, MD  Insulin Syringe-Needle U-100 30G X 1/2" 0.5 ML MISC Use with Lantus 05/15/23   Rodolph Bong, MD  ipratropium-albuterol (DUONEB) 0.5-2.5 (3) MG/3ML SOLN Take 3 mLs by nebulization every 6 (six) hours as needed (Use 1 vial in nebulizer four times daily).    [provider]  latanoprost (XALATAN) 0.005 % ophthalmic solution Place 1 drop into both eyes at bedtime.  04/11/15   [provider]  magnesium oxide (MAG-OX) 400 (240 Mg) MG tablet Take 1 tablet (400 mg total) by mouth daily. 08/12/22   Bing Neighbors, NP  methocarbamol (ROBAXIN) 500 MG tablet Take 1 tablet (500 mg  total) by mouth every 6 (six) hours as needed for muscle spasms. 05/15/23   Rodolph Bong, MD  Multiple Vitamin (MULTIVITAMIN WITH MINERALS) TABS tablet Take 1 tablet by mouth daily.    [provider]  naloxone [facility]) nasal spray 4 mg/0.1 mL Place 1 spray into the nose once. Instill 1 spray into one nostril as directed for opioid overdose, call 911 immediately, administer dose, then turn person on side if no response in 2-3 minutes or person responds but relapses. Repeat using a new spray device and into the other nostril.    [provider]  nitroGLYCERIN (NITROSTAT) 0.4 MG SL tablet Place 1 tablet (0.4 mg total) under the tongue every 5 (five) minutes as needed for chest pain. 01/29/22   Corrin Parker, PA-C  Omega-3 Fatty Acids (FISH OIL) 1000 MG CAPS Take 1,000 mg by mouth 2 (two) times daily.    [provider]  omeprazole (PRILOSEC) 40 MG capsule Take 40 mg by mouth 2 (two) times daily. Take one an empty stomach 30 minutes prior to a meal.    [provider]  ondansetron (ZOFRAN) 4 MG tablet Take 1 tablet (4 mg total) by mouth every 6 (six) hours as needed for  nausea. 05/15/23   Rodolph Bong, MD  Oxycodone HCl 10 MG TABS Take 1 tablet (10 mg total) by mouth every 4 (four) hours as needed for severe pain. 05/15/23   Rodolph Bong, MD  polyethylene glycol (MIRALAX / GLYCOLAX) 17 g packet Take 17 g by mouth 2 (two) times daily. 04/30/23   Lanae Boast, MD  senna-docusate (SENOKOT-S) 8.6-50 MG tablet Take 1 tablet by mouth 2 (two) times daily for 14 days. 05/15/23 05/30/23  Rodolph Bong, MD  tadalafil (CIALIS) 10 MG tablet Take 1 tablet (10 mg total) by mouth daily as needed for erectile dysfunction. 10/21/22   Sharlene Dory, PA-C  thiamine (VITAMIN B-1) 100 MG tablet Take 1 tablet (100 mg total) by mouth daily. 05/16/23   Rodolph Bong, MD      Allergies    Lisinopril-hydrochlorothiazide and Simvastatin    Review of Systems   Review of Systems  Cardiovascular:  Positive for near-syncope.    Physical Exam Updated Vital Signs BP (!) 166/96 (BP Location: Right Arm)   Pulse 90   Temp 97.7 F (36.5 C) (Oral)   Resp (!) 25   Ht 5\' 11"  (1.803 m)   Wt 81.6 kg   SpO2 100%   BMI 25.10 kg/m  Physical Exam Vitals and nursing note reviewed.  Constitutional:      Appearance: Normal appearance.  HENT:     Head: Normocephalic and atraumatic.     Mouth/Throat:     Mouth: Mucous membranes are moist.  Eyes:     General: No scleral icterus. Cardiovascular:     Rate and Rhythm: Normal rate and regular rhythm.     Pulses: Normal pulses.     Heart sounds: Normal heart sounds.  Pulmonary:     Effort: Pulmonary effort is normal.     Breath sounds: Normal breath sounds.  Abdominal:     General: Abdomen is flat.     Palpations: Abdomen is soft.     Tenderness: There is no abdominal tenderness.  Musculoskeletal:        General: No deformity.     Comments: R toe amputation wrapped up post surgery.  Skin:    General: Skin is warm.     Findings: No  rash.  Neurological:     General: No focal deficit present.     Mental Status: He is  alert.     Comments: Cranial nerves II through XII intact. Intact sensation to light touch in all 4 extremities. 5/5 strength in all 4 extremities. Intact finger-to-nose and heel-to-shin of all 4 extremities. No visual field cuts. No neglect noted. No aphasia noted.   Psychiatric:        Mood and Affect: Mood normal.     ED Results / Procedures / Treatments   Labs (all labs ordered are listed, but only abnormal results are displayed) Labs Reviewed - No data to display  EKG None  Radiology No results found.  Procedures Procedures    Medications Ordered in ED Medications - No data to display  ED Course/ Medical Decision Making/ A&P                             Medical Decision Making Amount and/or Complexity of Data Reviewed Labs: ordered. Radiology: ordered.  Risk Prescription drug management.   This patient presents to the ED for weakness, this involves an extensive number of treatment options, and is a complaint that carries with a high risk of complications and morbidity.  The differential diagnosis includes acs/mi, electrolyte abnormality, dehydration, low PO intake, anemia, infectious etiology.  This is not an exhaustive list.  Lab tests: I ordered and personally interpreted labs.  The pertinent results include: WBC unremarkable. Hbg unremarkable. Platelets unremarkable. Electrolytes unremarkable. BUN 27, creatinine 1.88 around baseline. Troponin elevated, flat, around baseline.  Imaging studies: I ordered imaging studies. I personally reviewed, interpreted imaging and agree with the radiologist's interpretations. The results include: Chest DG showed no acute cardiopulmonary abnormalities.   Problem list/ ED course/ Critical interventions/ Medical management: HPI: See above Vital signs within normal range and stable throughout visit. Laboratory/imaging studies significant for: See above. On physical examination, patient is afebrile and appears in no acute  distress.  Neuroexam with no focal neurological deficit.  Patient presents with a chief complaint of generalized weakness.  States he has not been able to eat in the last 48 hours due to being unable to fix his meal.  Patient does have home health set up 3 times a week and has been maxed out.  States he is concerned because there will be no one coming to help him over the weekend.  TOC consulted.  They states that patient has been maxed out on home health.  He was given a walker and home nurse 3-4 times a week.  At the end patient disclosed that he has a niece in town and will call for help with his home needs.  Workup including CBC with no leukocytosis or anemia.  CMP with no significant electrolyte abnormalities.  BUN and creatinine elevated but around his baseline.  Unlikely ACS/MI, patient troponins elevated but flat and around his baseline, EKG with no acute ischemic changes.  Social worker has spoken to the patient and his needs on the phone and explained to them about the situation.  There are no additional services available for TOC to offer.  Patient has already had all available services in place  Patient does not meet criteria for admission.  His generalized weakness is likely secondary to low p.o. intake in the last 2 days.  He was advised to ask family member to help with home needs, follow-up with podiatry and PCP for reevaluation.  Strict  turn precaution discussed. I have reviewed the patient home medicines and have made adjustments as needed.  Cardiac monitoring/EKG: The patient was maintained on a cardiac monitor.  I personally reviewed and interpreted the cardiac monitor which showed an underlying rhythm of: sinus rhythm.  Additional history obtained: External records from outside source obtained and reviewed including: Chart review including previous notes, labs, imaging.  Consultations obtained:  Disposition Continued outpatient therapy. Follow-up with PCP and podiatry recommended for  reevaluation of symptoms. Treatment plan discussed with patient.  Pt acknowledged understanding was agreeable to the plan. Worrisome signs and symptoms were discussed with patient, and patient acknowledged understanding to return to the ED if they noticed these signs and symptoms. Patient was stable upon discharge.   This chart was dictated using voice recognition software.  Despite best efforts to proofread,  errors can occur which can change the documentation meaning.          Final Clinical Impression(s) / ED Diagnoses Final diagnoses:  Generalized weakness    Rx / DC Orders ED Discharge Orders     None         Jeanelle Malling, [place] 05/23/23 2352    Benjiman Core, MD 05/24/23 [phone]

## 2023-05-23 NOTE — Progress Notes (Addendum)
CSW received consult for patient due to difficulty ambulating post toe amputation. Patient was set up with HH through [facility] and with DME (walker, 3 in 1, tub bench, and bedside commode).   This is the maximum amount of services available. There are no additional services available for TOC to offer.  CSW spoke with MD and PA to inform them of information.  CSW spoke with patient's niece June Leap to discuss patient. CSW answered all questions and informed her that patient has all available services in place. June Leap requested to speak with a provider before discharge.  CSW spoke with Tresa Endo of [facility] who confirms staff from the agency has seen the patient this week. Tresa Endo will ensure patient is seen ASAP to avoid ED visits.  Edwin Dada, MSW, LCSW Transitions of Care  Clinical Social Worker II [phone]

## 2023-05-23 NOTE — Discharge Instructions (Addendum)
Please take your medications as prescribed. Take tylenol/ibuprofen every 6 hours or Percocet as needed for pain. I recommend close follow-up with PCP for reevaluation.  Please do not hesitate to return to emergency department if worrisome signs symptoms we discussed become apparent.  

## 2023-05-23 NOTE — ED Triage Notes (Signed)
Pt bib ems from home c/o dizziness. Pt d/c 05/19/23 post-op right toe amputation. Pt states he haven't eating in 24-48 d/t no help at home. He states its hard for him to maneuver around the home.    BP 136/86 HR 106 RR 20 RA 98% CBG 224

## 2023-05-27 ENCOUNTER — Encounter (HOSPITAL_COMMUNITY): Payer: Self-pay | Admitting: Emergency Medicine

## 2023-05-27 ENCOUNTER — Ambulatory Visit (INDEPENDENT_AMBULATORY_CARE_PROVIDER_SITE_OTHER): Payer: No Typology Code available for payment source | Admitting: Physician Assistant

## 2023-05-27 ENCOUNTER — Telehealth: Payer: Self-pay

## 2023-05-27 ENCOUNTER — Inpatient Hospital Stay (HOSPITAL_COMMUNITY)
Admission: EM | Admit: 2023-05-27 | Discharge: 2023-06-12 | DRG: 907 | Disposition: A | Discharge status: Rehabilitation Facility | Payer: No Typology Code available for payment source | Attending: Internal Medicine | Admitting: Internal Medicine

## 2023-05-27 ENCOUNTER — Emergency Department (HOSPITAL_COMMUNITY): Payer: No Typology Code available for payment source

## 2023-05-27 ENCOUNTER — Other Ambulatory Visit: Payer: Self-pay

## 2023-05-27 VITALS — BP 96/66 | HR 108 | Temp 98.2°F

## 2023-05-27 DIAGNOSIS — E1122 Type 2 diabetes mellitus with diabetic chronic kidney disease: Secondary | ICD-10-CM | POA: Diagnosis present

## 2023-05-27 DIAGNOSIS — I739 Peripheral vascular disease, unspecified: Secondary | ICD-10-CM | POA: Diagnosis not present

## 2023-05-27 DIAGNOSIS — E785 Hyperlipidemia, unspecified: Secondary | ICD-10-CM | POA: Diagnosis present

## 2023-05-27 DIAGNOSIS — E8722 Chronic metabolic acidosis: Secondary | ICD-10-CM | POA: Diagnosis present

## 2023-05-27 DIAGNOSIS — Z7951 Long term (current) use of inhaled steroids: Secondary | ICD-10-CM

## 2023-05-27 DIAGNOSIS — M79661 Pain in right lower leg: Secondary | ICD-10-CM | POA: Diagnosis not present

## 2023-05-27 DIAGNOSIS — E1165 Type 2 diabetes mellitus with hyperglycemia: Secondary | ICD-10-CM | POA: Diagnosis present

## 2023-05-27 DIAGNOSIS — I788 Other diseases of capillaries: Secondary | ICD-10-CM | POA: Diagnosis present

## 2023-05-27 DIAGNOSIS — E43 Unspecified severe protein-calorie malnutrition: Secondary | ICD-10-CM | POA: Diagnosis present

## 2023-05-27 DIAGNOSIS — Z955 Presence of coronary angioplasty implant and graft: Secondary | ICD-10-CM | POA: Diagnosis not present

## 2023-05-27 DIAGNOSIS — Z79899 Other long term (current) drug therapy: Secondary | ICD-10-CM | POA: Diagnosis not present

## 2023-05-27 DIAGNOSIS — L03115 Cellulitis of right lower limb: Secondary | ICD-10-CM | POA: Diagnosis present

## 2023-05-27 DIAGNOSIS — Z89511 Acquired absence of right leg below knee: Secondary | ICD-10-CM

## 2023-05-27 DIAGNOSIS — J42 Unspecified chronic bronchitis: Secondary | ICD-10-CM | POA: Diagnosis not present

## 2023-05-27 DIAGNOSIS — E1169 Type 2 diabetes mellitus with other specified complication: Secondary | ICD-10-CM | POA: Diagnosis present

## 2023-05-27 DIAGNOSIS — Z7984 Long term (current) use of oral hypoglycemic drugs: Secondary | ICD-10-CM

## 2023-05-27 DIAGNOSIS — Z794 Long term (current) use of insulin: Secondary | ICD-10-CM

## 2023-05-27 DIAGNOSIS — I25110 Atherosclerotic heart disease of native coronary artery with unstable angina pectoris: Secondary | ICD-10-CM | POA: Diagnosis not present

## 2023-05-27 DIAGNOSIS — R630 Anorexia: Secondary | ICD-10-CM | POA: Diagnosis not present

## 2023-05-27 DIAGNOSIS — I129 Hypertensive chronic kidney disease with stage 1 through stage 4 chronic kidney disease, or unspecified chronic kidney disease: Secondary | ICD-10-CM | POA: Diagnosis not present

## 2023-05-27 DIAGNOSIS — Z87891 Personal history of nicotine dependence: Secondary | ICD-10-CM | POA: Diagnosis not present

## 2023-05-27 DIAGNOSIS — T8789 Other complications of amputation stump: Secondary | ICD-10-CM | POA: Diagnosis not present

## 2023-05-27 DIAGNOSIS — E1159 Type 2 diabetes mellitus with other circulatory complications: Secondary | ICD-10-CM

## 2023-05-27 DIAGNOSIS — L0390 Cellulitis, unspecified: Secondary | ICD-10-CM

## 2023-05-27 DIAGNOSIS — F32A Depression, unspecified: Secondary | ICD-10-CM | POA: Diagnosis present

## 2023-05-27 DIAGNOSIS — N1832 Chronic kidney disease, stage 3b: Secondary | ICD-10-CM | POA: Diagnosis not present

## 2023-05-27 DIAGNOSIS — E1152 Type 2 diabetes mellitus with diabetic peripheral angiopathy with gangrene: Secondary | ICD-10-CM | POA: Diagnosis present

## 2023-05-27 DIAGNOSIS — J449 Chronic obstructive pulmonary disease, unspecified: Secondary | ICD-10-CM | POA: Diagnosis present

## 2023-05-27 DIAGNOSIS — Z8249 Family history of ischemic heart disease and other diseases of the circulatory system: Secondary | ICD-10-CM | POA: Diagnosis not present

## 2023-05-27 DIAGNOSIS — R000 Tachycardia, unspecified: Secondary | ICD-10-CM | POA: Diagnosis present

## 2023-05-27 DIAGNOSIS — E872 Acidosis, unspecified: Secondary | ICD-10-CM | POA: Diagnosis present

## 2023-05-27 DIAGNOSIS — Z6821 Body mass index (BMI) 21.0-21.9, adult: Secondary | ICD-10-CM

## 2023-05-27 DIAGNOSIS — E118 Type 2 diabetes mellitus with unspecified complications: Secondary | ICD-10-CM

## 2023-05-27 DIAGNOSIS — E871 Hypo-osmolality and hyponatremia: Secondary | ICD-10-CM | POA: Diagnosis present

## 2023-05-27 DIAGNOSIS — I152 Hypertension secondary to endocrine disorders: Secondary | ICD-10-CM | POA: Diagnosis present

## 2023-05-27 DIAGNOSIS — L03818 Cellulitis of other sites: Principal | ICD-10-CM

## 2023-05-27 DIAGNOSIS — Z888 Allergy status to other drugs, medicaments and biological substances status: Secondary | ICD-10-CM

## 2023-05-27 DIAGNOSIS — Y835 Amputation of limb(s) as the cause of abnormal reaction of the patient, or of later complication, without mention of misadventure at the time of the procedure: Secondary | ICD-10-CM | POA: Diagnosis present

## 2023-05-27 DIAGNOSIS — T8781 Dehiscence of amputation stump: Secondary | ICD-10-CM | POA: Diagnosis not present

## 2023-05-27 DIAGNOSIS — I2510 Atherosclerotic heart disease of native coronary artery without angina pectoris: Secondary | ICD-10-CM | POA: Diagnosis present

## 2023-05-27 DIAGNOSIS — T8189XA Other complications of procedures, not elsewhere classified, initial encounter: Principal | ICD-10-CM | POA: Diagnosis present

## 2023-05-27 DIAGNOSIS — Z7902 Long term (current) use of antithrombotics/antiplatelets: Secondary | ICD-10-CM

## 2023-05-27 DIAGNOSIS — I252 Old myocardial infarction: Secondary | ICD-10-CM | POA: Diagnosis not present

## 2023-05-27 DIAGNOSIS — Z7982 Long term (current) use of aspirin: Secondary | ICD-10-CM

## 2023-05-27 LAB — COMPREHENSIVE METABOLIC PANEL
ALT: 31 U/L (ref 0–44)
AST: 24 U/L (ref 15–41)
Albumin: 3.3 g/dL — ABNORMAL LOW (ref 3.5–5.0)
Alkaline Phosphatase: 72 U/L (ref 38–126)
Anion gap: 15 (ref 5–15)
BUN: 25 mg/dL — ABNORMAL HIGH (ref 8–23)
CO2: 15 mmol/L — ABNORMAL LOW (ref 22–32)
Calcium: 9.8 mg/dL (ref 8.9–10.3)
Chloride: 102 mmol/L (ref 98–111)
Creatinine, Ser: 1.73 mg/dL — ABNORMAL HIGH (ref 0.61–1.24)
GFR, Estimated: 44 mL/min — ABNORMAL LOW (ref >60)
Glucose, Bld: 174 mg/dL — ABNORMAL HIGH (ref 70–99)
Potassium: 3.4 mmol/L — ABNORMAL LOW (ref 3.5–5.1)
Sodium: 132 mmol/L — ABNORMAL LOW (ref 135–145)
Total Bilirubin: 0.7 mg/dL (ref 0.3–1.2)
Total Protein: 6.7 g/dL (ref 6.5–8.1)

## 2023-05-27 LAB — CBC WITH DIFFERENTIAL/PLATELET
Abs Immature Granulocytes: 0.02 10*3/uL (ref 0.00–0.07)
Basophils Absolute: 0 10*3/uL (ref 0.0–0.1)
Basophils Relative: 0 %
Eosinophils Absolute: 0.2 10*3/uL (ref 0.0–0.5)
Eosinophils Relative: 2 %
HCT: 33.5 % — ABNORMAL LOW (ref 39.0–52.0)
Hemoglobin: 12.1 g/dL — ABNORMAL LOW (ref 13.0–17.0)
Immature Granulocytes: 0 %
Lymphocytes Relative: 28 %
Lymphs Abs: 2.2 10*3/uL (ref 0.7–4.0)
MCH: 27.3 pg (ref 26.0–34.0)
MCHC: 36.1 g/dL — ABNORMAL HIGH (ref 30.0–36.0)
MCV: 75.5 fL — ABNORMAL LOW (ref 80.0–100.0)
Monocytes Absolute: 0.5 10*3/uL (ref 0.1–1.0)
Monocytes Relative: 7 %
Neutro Abs: 5.1 10*3/uL (ref 1.7–7.7)
Neutrophils Relative %: 63 %
Platelets: 268 10*3/uL (ref 150–400)
RBC: 4.44 MIL/uL (ref 4.22–5.81)
RDW: 12.9 % (ref 11.5–15.5)
WBC: 8 10*3/uL (ref 4.0–10.5)
nRBC: 0 % (ref 0.0–0.2)

## 2023-05-27 LAB — LACTIC ACID, PLASMA: Lactic Acid, Venous: 1.8 mmol/L (ref 0.5–1.9)

## 2023-05-27 MED ORDER — POTASSIUM CHLORIDE 20 MEQ PO PACK
40.0000 meq | PACK | Freq: Once | ORAL | Status: AC
  Administered 2023-05-27: 40 meq via ORAL
  Filled 2023-05-27: qty 2

## 2023-05-27 MED ORDER — POLYETHYLENE GLYCOL 3350 17 G PO PACK: 17.0000 g | PACK | Freq: Every day | ORAL | Status: DC | PRN

## 2023-05-27 MED ORDER — SENNA 8.6 MG PO TABS
1.0000 | ORAL_TABLET | Freq: Two times a day (BID) | ORAL | Status: DC
  Administered 2023-05-27 – 2023-06-11 (×29): 8.6 mg via ORAL
  Filled 2023-05-27 (×31): qty 1

## 2023-05-27 MED ORDER — ONDANSETRON HCL 4 MG PO TABS: 4.0000 mg | ORAL_TABLET | Freq: Four times a day (QID) | ORAL | Status: DC | PRN

## 2023-05-27 MED ORDER — OXYCODONE HCL ER 10 MG PO T12A
10.0000 mg | EXTENDED_RELEASE_TABLET | Freq: Two times a day (BID) | ORAL | Status: DC
  Administered 2023-05-27 – 2023-06-03 (×14): 10 mg via ORAL
  Filled 2023-05-27 (×14): qty 1

## 2023-05-27 MED ORDER — METRONIDAZOLE 500 MG/100ML IV SOLN
500.0000 mg | Freq: Two times a day (BID) | INTRAVENOUS | Status: AC
  Administered 2023-05-27 – 2023-05-31 (×8): 500 mg via INTRAVENOUS
  Filled 2023-05-27 (×7): qty 100

## 2023-05-27 MED ORDER — INSULIN ASPART 100 UNIT/ML IJ SOLN
0.0000 [IU] | Freq: Three times a day (TID) | INTRAMUSCULAR | Status: DC
  Administered 2023-05-28: 1 [IU] via SUBCUTANEOUS
  Administered 2023-05-28 – 2023-05-29 (×4): 2 [IU] via SUBCUTANEOUS
  Administered 2023-05-30: 3 [IU] via SUBCUTANEOUS
  Administered 2023-05-30: 5 [IU] via SUBCUTANEOUS
  Administered 2023-05-31 (×3): 3 [IU] via SUBCUTANEOUS
  Administered 2023-06-01: 5 [IU] via SUBCUTANEOUS
  Administered 2023-06-01: 1 [IU] via SUBCUTANEOUS
  Administered 2023-06-01 – 2023-06-02 (×2): 5 [IU] via SUBCUTANEOUS
  Administered 2023-06-02 (×2): 2 [IU] via SUBCUTANEOUS
  Administered 2023-06-03: 5 [IU] via SUBCUTANEOUS
  Administered 2023-06-03: 7 [IU] via SUBCUTANEOUS
  Administered 2023-06-03: 3 [IU] via SUBCUTANEOUS
  Administered 2023-06-04: 1 [IU] via SUBCUTANEOUS
  Administered 2023-06-04: 5 [IU] via SUBCUTANEOUS
  Administered 2023-06-04: 7 [IU] via SUBCUTANEOUS
  Administered 2023-06-05: 2 [IU] via SUBCUTANEOUS
  Administered 2023-06-05: 3 [IU] via SUBCUTANEOUS
  Administered 2023-06-05 – 2023-06-06 (×2): 2 [IU] via SUBCUTANEOUS
  Administered 2023-06-06: 7 [IU] via SUBCUTANEOUS
  Administered 2023-06-06: 5 [IU] via SUBCUTANEOUS
  Administered 2023-06-07: 3 [IU] via SUBCUTANEOUS
  Administered 2023-06-07: 7 [IU] via SUBCUTANEOUS
  Administered 2023-06-07: 9 [IU] via SUBCUTANEOUS
  Administered 2023-06-08 (×2): 3 [IU] via SUBCUTANEOUS
  Administered 2023-06-08 – 2023-06-09 (×2): 9 [IU] via SUBCUTANEOUS
  Administered 2023-06-09: 2 [IU] via SUBCUTANEOUS
  Administered 2023-06-09: 9 [IU] via SUBCUTANEOUS
  Administered 2023-06-10: 7 [IU] via SUBCUTANEOUS
  Administered 2023-06-10: 1 [IU] via SUBCUTANEOUS
  Administered 2023-06-10: 3 [IU] via SUBCUTANEOUS
  Administered 2023-06-11 (×3): 1 [IU] via SUBCUTANEOUS

## 2023-05-27 MED ORDER — ACETAMINOPHEN 650 MG RE SUPP: 650.0000 mg | Freq: Four times a day (QID) | RECTAL | Status: DC | PRN

## 2023-05-27 MED ORDER — SODIUM CHLORIDE 0.9 % IV SOLN
2.0000 g | INTRAVENOUS | Status: AC
  Administered 2023-05-27 – 2023-05-30 (×4): 2 g via INTRAVENOUS
  Filled 2023-05-27 (×4): qty 20

## 2023-05-27 MED ORDER — ONDANSETRON HCL 4 MG/2ML IJ SOLN
4.0000 mg | Freq: Four times a day (QID) | INTRAMUSCULAR | Status: DC | PRN
  Administered 2023-06-07: 4 mg via INTRAVENOUS
  Filled 2023-05-27: qty 2

## 2023-05-27 MED ORDER — MORPHINE SULFATE (PF) 4 MG/ML IV SOLN
4.0000 mg | Freq: Once | INTRAVENOUS | Status: AC
  Administered 2023-05-27: 4 mg via INTRAVENOUS
  Filled 2023-05-27: qty 1

## 2023-05-27 MED ORDER — BISACODYL 5 MG PO TBEC: 5.0000 mg | DELAYED_RELEASE_TABLET | Freq: Every day | ORAL | Status: DC | PRN

## 2023-05-27 MED ORDER — HYDROMORPHONE HCL 1 MG/ML IJ SOLN
0.5000 mg | INTRAMUSCULAR | Status: DC | PRN
  Administered 2023-05-28 – 2023-05-31 (×23): 1 mg via INTRAVENOUS
  Filled 2023-05-27 (×23): qty 1

## 2023-05-27 MED ORDER — HEPARIN SODIUM (PORCINE) 5000 UNIT/ML IJ SOLN
5000.0000 [IU] | Freq: Three times a day (TID) | INTRAMUSCULAR | Status: DC
  Administered 2023-05-27 – 2023-05-29 (×7): 5000 [IU] via SUBCUTANEOUS
  Filled 2023-05-27 (×7): qty 1

## 2023-05-27 MED ORDER — INSULIN ASPART 100 UNIT/ML IJ SOLN
0.0000 [IU] | Freq: Every day | INTRAMUSCULAR | Status: DC
  Administered 2023-05-29: 3 [IU] via SUBCUTANEOUS
  Administered 2023-05-30: 4 [IU] via SUBCUTANEOUS
  Administered 2023-05-31: 2 [IU] via SUBCUTANEOUS
  Administered 2023-06-06 – 2023-06-08 (×3): 3 [IU] via SUBCUTANEOUS

## 2023-05-27 MED ORDER — INSULIN GLARGINE-YFGN 100 UNIT/ML ~~LOC~~ SOLN
20.0000 [IU] | Freq: Every day | SUBCUTANEOUS | Status: DC
  Filled 2023-05-27: qty 0.2

## 2023-05-27 MED ORDER — ACETAMINOPHEN 325 MG PO TABS
650.0000 mg | ORAL_TABLET | Freq: Four times a day (QID) | ORAL | Status: DC | PRN
  Administered 2023-06-01 – 2023-06-02 (×2): 650 mg via ORAL
  Filled 2023-05-27 (×3): qty 2

## 2023-05-27 NOTE — Consult Note (Signed)
[facility] NOTE   MRN : [id number]  Reason for Consult: ischemic TMA with tissue loss Referring Physician: Seen in office at [facility] today sent to [facility] for admission with IM medical management and possible revision verse BKA.  History of Present Illness: HPI: Sofian Junior [place] is a 62 y.o. (02-Apr-1961) male who presents to clinic as an urgent add-on due to increasing pain and tissue changes to the right transmetatarsal amputation.  He underwent right lower extremity arteriogram with right popliteal artery shockwave lithotripsy and drug-coated balloon angioplasty by Dr. Randie Heinz on 04/27/2023.  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  He went to the emergency department with generalized weakness on 05/23/2023 however was not admitted.  Since that time he has had poor oral intake and possibly feverish in addition to worsening symptoms of his right foot.  He continues to take his aspirin, Plavix, statin daily.  Past medical history also significant for CAD, CKD, insulin-dependent diabetes mellitus, hypertension.  He lives alone however was brought to the office by his friend.      No current facility-administered medications for this encounter.   Current Outpatient Medications  Medication Sig Dispense Refill   acetaminophen (TYLENOL) 325 MG tablet Take 650 mg by mouth every 4 (four) hours as needed for mild pain, moderate pain or headache.     albuterol (PROVENTIL HFA;VENTOLIN HFA) 108 (90 BASE) MCG/ACT inhaler Inhale 2 puffs into the lungs every 6 (six) hours as needed for wheezing or shortness of breath.      Alogliptin Benzoate 25 MG TABS Take 12.5 mg by mouth daily. For diabetes     amitriptyline (ELAVIL) 25 MG tablet Take 1 tablet (25 mg total) by mouth at bedtime. 30 tablet 1   amLODipine (NORVASC) 10 MG tablet Take 1 tablet (10 mg total) by mouth daily. 90 tablet 3   aspirin EC 81 MG  tablet Take 81 mg by mouth daily. Swallow whole.     atorvastatin (LIPITOR) 80 MG tablet Take 1 tablet (80 mg total) by mouth daily. (Patient taking differently: Take 80 mg by mouth every evening.) 30 tablet 2   Brinzolamide-Brimonidine 1-0.2 % SUSP Place 1 drop into both eyes 3 (three) times daily.     carvedilol (COREG) 12.5 MG tablet Take 12.5 mg by mouth 2 (two) times daily with a meal.     clopidogrel (PLAVIX) 75 MG tablet Take 1 tablet (75 mg total) by mouth daily with breakfast. 30 tablet 5   cyanocobalamin (VITAMIN B12) 500 MCG tablet Take 500 mcg by mouth daily.     Dextrose-Fructose-Sod Citrate 4.35-4.17-0.921 GM/15ML LIQD Take 15 cm by mouth as needed (15cm (1 tube) by mouth as needed for hypoglycemia for  glucose less than 70, repeat every 15 minutes if blood sugar less than 70.).     fluticasone-salmeterol (ADVAIR) 500-50 MCG/ACT AEPB Inhale 1 puff into the lungs in the morning and at bedtime.     folic acid (FOLVITE) 1 MG tablet Take 1 tablet (1 mg total) by mouth daily.     furosemide (LASIX) 20 MG tablet Take 1 tablet by mouth every other day.     gabapentin (NEURONTIN) 300 MG capsule Take 300 mg by mouth every evening.     hydrALAZINE (APRESOLINE) 100 MG tablet Take 1 tablet (100 mg total) by mouth 3 (three) times daily. (Patient taking differently: Take 100 mg by mouth 2 (two) times daily.) 90 tablet 11  hydrOXYzine (ATARAX) 25 MG tablet Take 1 tablet (25 mg total) by mouth 3 (three) times daily as needed for itching or anxiety. 20 tablet 0   hydrOXYzine (VISTARIL) 25 MG capsule Take 25 mg by mouth daily. Itching.     insulin glargine (LANTUS) 100 UNIT/ML injection Inject 0.3 mLs (30 Units total) into the skin daily. Expires 28 days after first use 10 mL 0   Insulin Syringe-Needle U-100 30G X 1/2" 0.5 ML MISC Use with Lantus 30 each 0   ipratropium-albuterol (DUONEB) 0.5-2.5 (3) MG/3ML SOLN Take 3 mLs by nebulization every 6 (six) hours as needed (Use 1 vial in nebulizer four times  daily).     latanoprost (XALATAN) 0.005 % ophthalmic solution Place 1 drop into both eyes at bedtime.      magnesium oxide (MAG-OX) 400 (240 Mg) MG tablet Take 1 tablet (400 mg total) by mouth daily.     methocarbamol (ROBAXIN) 500 MG tablet Take 1 tablet (500 mg total) by mouth every 6 (six) hours as needed for muscle spasms. 20 tablet 0   Multiple Vitamin (MULTIVITAMIN WITH MINERALS) TABS tablet Take 1 tablet by mouth daily.     naloxone (NARCAN) nasal spray 4 mg/0.1 mL Place 1 spray into the nose once. Instill 1 spray into one nostril as directed for opioid overdose, call 911 immediately, administer dose, then turn person on side if no response in 2-3 minutes or person responds but relapses. Repeat using a new spray device and into the other nostril.     nitroGLYCERIN (NITROSTAT) 0.4 MG SL tablet Place 1 tablet (0.4 mg total) under the tongue every 5 (five) minutes as needed for chest pain. 25 tablet 2   Omega-3 Fatty Acids (FISH OIL) 1000 MG CAPS Take 1,000 mg by mouth 2 (two) times daily.     omeprazole (PRILOSEC) 40 MG capsule Take 40 mg by mouth 2 (two) times daily. Take one an empty stomach 30 minutes prior to a meal.     ondansetron (ZOFRAN) 4 MG tablet Take 1 tablet (4 mg total) by mouth every 6 (six) hours as needed for nausea. 20 tablet 0   Oxycodone HCl 10 MG TABS Take 1 tablet (10 mg total) by mouth every 4 (four) hours as needed for severe pain. 20 tablet 0   oxyCODONE-acetaminophen (PERCOCET/ROXICET) 5-325 MG tablet Take 1 tablet by mouth every 6 (six) hours as needed for severe pain. 15 tablet 0   polyethylene glycol (MIRALAX / GLYCOLAX) 17 g packet Take 17 g by mouth 2 (two) times daily. 14 each 0   senna-docusate (SENOKOT-S) 8.6-50 MG tablet Take 1 tablet by mouth 2 (two) times daily for 14 days. 28 tablet 0   tadalafil (CIALIS) 10 MG tablet Take 1 tablet (10 mg total) by mouth daily as needed for erectile dysfunction. 10 tablet 0   thiamine (VITAMIN B-1) 100 MG tablet Take 1 tablet  (100 mg total) by mouth daily.      Pt meds include: Statin :Yes Betablocker: Yes ASA: Yes Other anticoagulants/antiplatelets: none  Past Medical History:  Diagnosis Date   Alcohol abuse    CAD (coronary artery disease)    a. Reported MI in 2012 s/p 2 stents;  b. 08/2012 Cath: LM 20, LAD 20 diff ISR, jailed septal - 99%, LCX 30ost, RI large, min irregs, RCA 30p, 20-30 ISR-->Med Rx; c. 2017 [facility].   CKD (chronic kidney disease), stage III (HCC)    Cocaine abuse (HCC)  COPD (chronic obstructive pulmonary disease) (HCC)    Depression    Diabetes mellitus without complication (HCC)    Hyperlipidemia    Hypertension    Hypertensive urgency 02/11/2021   Noncompliance     Past Surgical History:  Procedure Laterality Date   ABDOMINAL AORTOGRAM W/LOWER EXTREMITY N/A 04/27/2023   Procedure: ABDOMINAL AORTOGRAM W/LOWER EXTREMITY;  Surgeon: Maeola Harman, MD;  Location: [facility] INVASIVE CV LAB;  Service: Cardiovascular;  Laterality: N/A;   CARDIAC CATHETERIZATION     CORONARY STENT PLACEMENT     LEFT HEART CATH AND CORONARY ANGIOGRAPHY N/A 08/22/2020   Procedure: LEFT HEART CATH AND CORONARY ANGIOGRAPHY;  Surgeon: Lyn Records, MD;  Location: [facility] INVASIVE CV LAB;  Service: Cardiovascular;  Laterality: N/A;   LEFT HEART CATH AND CORONARY ANGIOGRAPHY N/A 01/28/2022   Procedure: LEFT HEART CATH AND CORONARY ANGIOGRAPHY;  Surgeon: Lennette Bihari, MD;  Location: [facility] INVASIVE CV LAB;  Service: Cardiovascular;  Laterality: N/A;   LEFT HEART CATHETERIZATION WITH CORONARY ANGIOGRAM Bilateral 08/25/2012   Procedure: LEFT HEART CATHETERIZATION WITH CORONARY ANGIOGRAM;  Surgeon: Kathleene Hazel, MD;  Location: [facility] CATH LAB;  Service: Cardiovascular;  Laterality: Bilateral;   PERIPHERAL INTRAVASCULAR LITHOTRIPSY  04/27/2023   Procedure: PERIPHERAL INTRAVASCULAR LITHOTRIPSY;  Surgeon: Maeola Harman, MD;  Location: [facility] INVASIVE CV LAB;  Service: Cardiovascular;;    PERIPHERAL VASCULAR BALLOON ANGIOPLASTY  04/27/2023   Procedure: PERIPHERAL VASCULAR BALLOON ANGIOPLASTY;  Surgeon: Maeola Harman, MD;  Location: [facility] INVASIVE CV LAB;  Service: Cardiovascular;;   TRANSMETATARSAL AMPUTATION Right 05/12/2023   Procedure: TRANSMETATARSAL AMPUTATION;  Surgeon: Chuck Hint, MD;  Location: [facility] OR;  Service: Vascular;  Laterality: Right;    Social History Social History   Tobacco Use   Smoking status: Former    Types: Cigarettes    Quit date: 11/25/2007    Years since quitting: 15.5   Smokeless tobacco: Never  Vaping Use   Vaping Use: Never used  Substance Use Topics   Alcohol use: Yes    Comment: Used to drink heavily - says currently 2 beers/day.   Drug use: Yes    Types: Marijuana    Comment: reports not using cocaine    Family History Family History  Problem Relation Age of Onset   Heart disease Mother        MI 36s    Allergies  Allergen Reactions   Lisinopril-Hydrochlorothiazide Swelling and Other (See Comments)    Causes swelling of lips   Simvastatin Other (See Comments)    ALT (SGPT) level raised, Aspartate aminotransferase serum level raised     REVIEW OF SYSTEMS  General: [ ]  Weight loss, [ ]  Fever, [ ]  chills Neurologic: [ ]  Dizziness, [ ]  Blackouts, [ ]  Seizure [ ]  Stroke, [ ]  "Mini stroke", [ ]  Slurred speech, [ ]  Temporary blindness; [ ]  weakness in arms or legs, [ ]  Hoarseness [ ]  Dysphagia Cardiac: [ ]  Chest pain/pressure, [ ]  Shortness of breath at rest [ ]  Shortness of breath with exertion, [ ]  Atrial fibrillation or irregular heartbeat  Vascular: [ ]  Pain in legs with walking, [ x] Pain in legs at rest, [ x] Pain in legs at night,  [x ] Non-healing ulcer, [ ]  Blood clot in vein/DVT,   Pulmonary: [ ]  Home oxygen, [ ]  Productive cough, [ ]  Coughing up blood, [ ]  Asthma,  [ ]  Wheezing [ ]  COPD Musculoskeletal:  [ ]  Arthritis, [ ]  Low back pain, [ ]  Joint pain  Hematologic: [ ]  Easy Bruising, [ ]  Anemia;  [ ]  Hepatitis Gastrointestinal: [ ]  Blood in stool, [ ]  Gastroesophageal Reflux/heartburn, Urinary: [x ] chronic Kidney disease, [ ]  on HD - [ ]  MWF or [ ]  TTHS, [ ]  Burning with urination, [ ]  Difficulty urinating Skin: [ ]  Rashes, [ ]  Wounds Psychological: [ ]  Anxiety, [ ]  Depression  Physical Examination Vitals:   05/27/23 1503 05/27/23 1505  BP: (!) 145/93   Pulse: (!) 102   Resp: 17   Temp: 97.7 F (36.5 C)   SpO2: 100%   Weight:  81.6 kg  Height:  5\' 11"  (1.803 m)   Body mass index is 25.1 kg/m. General:  WDWN in NAD; vital signs documented above Gait: Not observed HENT: WNL, normocephalic Pulmonary: normal non-labored breathing , without Rales, rhonchi,  wheezing Cardiac: regular HR Abdomen: soft, NT, no masses Skin: without rashes Vascular Exam/Pulses: Monophasic right DP, PT, peroneal Extremities: Necrotic TMA, warm to touch, severe pain with light touch Musculoskeletal: no muscle wasting or atrophy       Neurologic: A&O X 3 Psychiatric:  The pt has Normal affect.           Significant Diagnostic Studies: CBC Lab Results  Component Value Date   WBC 8.4 05/23/2023   HGB 12.7 (L) 05/23/2023   HCT 35.8 (L) 05/23/2023   MCV 76.7 (L) 05/23/2023   PLT 265 05/23/2023    BMET    Component Value Date/Time   NA 134 (L) 05/23/2023 1133   NA 131 (L) 12/14/2020 0849   K 4.1 05/23/2023 1133   CL 97 (L) 05/23/2023 1133   CO2 18 (L) 05/23/2023 1133   GLUCOSE 226 (H) 05/23/2023 1133   BUN 27 (H) 05/23/2023 1133   BUN 26 12/14/2020 0849   CREATININE 1.88 (H) 05/23/2023 1133   CALCIUM 10.2 05/23/2023 1133   GFRNONAA 39 (L) 05/23/2023 1133   GFRAA 53 (L) 12/14/2020 0849   Estimated Creatinine Clearance: 42.3 mL/min (A) (by C-G formula based on SCr of 1.88 mg/dL (H)).  COAG Lab Results  Component Value Date   INR 1.1 01/27/2022   INR 1.04 08/29/2017   INR 1.00 10/17/2015    ASSESSMENT/PLAN:: 62 y.o. male who presents to clinic as an urgent add-on due to  increasing pain and tissue loss of the right TMA site   -Mr. Richmond Bounds is a 62 year old male who underwent shockwave lithotripsy and balloon angioplasty of the right popliteal artery about a month ago due to gangrenous toes.  2 weeks ago he underwent right TMA due to worsening tissue loss.  He presented to the emergency department on 05/23/2023 with generalized weakness and poor oral intake.  Today he comes in with severe pain in his right foot and worsening tissue loss pictured above.  He continues to have poor oral intake.  Vital signs show tachycardia with soft blood pressure.  He appears ill on exam today.   There are concerns that  he has developed a systemic infection.  It was  recommended he present to the emergency department for potential admission.  We also discussed the high likelihood of requiring a right below the knee amputation during hospitalization.  He is agreeable to present to the [facility] ER this afternoon.  Vascular will follow.   Mosetta Pigeon 05/27/2023 3:58 PM

## 2023-05-27 NOTE — Hospital Course (Addendum)
Jose [name] is a 62 y.o. male with medical history significant for CAD s/p PCI, COPD, CKD stage IIIb, T2DM, HTN, HLD, PAD s/p right TMA 05/12/2023 who is admitted with increasing pain and tissue loss of the right TMA site.  Seen by vascular surgery who recommended medical admission and consideration of right BKA.  HPI: Jose [name] is a 62 y.o. male with medical history significant for CAD s/p PCI, COPD, CKD stage IIIb, T2DM, HTN, HLD, PAD s/p right TMA 05/12/2023 who presented to the ED for evaluation of increasing pain and tissue loss at the right TMA site.   Patient previously underwent shockwave lithotripsy and balloon angioplasty of the right popliteal artery on 04/27/2023 by vascular surgery Dr. Randie Heinz due to gangrenous toes.  He was readmitted 6/15-6/24 due to progressive dry gangrene.  He underwent right sided transmetatarsal amputation by Dr. Edilia Bo on 05/12/2023.  There was concern for superimposed infection and patient was treated with IV Rocephin in hospital and discharged on 1 week of Augmentin and doxycycline.   Patient states that he has been having uncontrolled pain at the surgical wound site.  He ran out of his pain medications 3 days ago and return to the vascular surgery clinic today for further evaluation and for medication refill.   Patient was noted to have worsening tissue loss at the surgical wound site as well as soft BP 96/66 and mild tachycardia 108.  Patient was advised to present to the ED for evaluation in anticipation of admission.  They discussed potential need for right below the knee amputation.   Patient states that pain seems to be easing after receiving IV narcotics while in the ED.  He denies any fevers, diaphoresis, chest pain, dyspnea.  Has had occasional chills.  He has not seen any significant discharge from his surgical wound site.  He reports seeing blood on surgical dressing when it was unwrapped in the clinic today.  He is tearful due to concern for  needing further surgery.   ED Course  Labs/Imaging on admission: I have personally reviewed following labs and imaging studies.   Initial vitals showed BP 145/93, pulse 102, RR 17, temp 97.7 F, SpO2 100% on room air.   Labs showed WBC 8.0, hemoglobin 12.1, platelets 268,000, sodium 132, potassium 3.4, bicarb 15, BUN 25, creatinine 1.73, serum glucose 174, LFTs within normal limits.  Blood cultures ordered and pending collection.  Lactic acid 1.8.   Right foot x-ray shows interval transmetatarsal amputation of all 5 digits with overlying soft tissue swelling but no other acute finding.  No osseous erosion or destruction.   Patient was given IV ceftriaxone and Flagyl, IV morphine 4 mg.  Vascular surgery consulted and recommended medical admission.  The hospitalist service was consulted to admit for further evaluation and management.  Significant Events: Admitted 05-27-2023 Coreg increased to 18.75 mg bid on 06-07-2023 for better BP control 06-09-2023. Started remeron 15 mg for appetite. Started scheduled tylenol 1000 mg q6h. Pt turned down by [facility]. Other SNF bed search started.  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 Randie Heinz

## 2023-05-27 NOTE — Progress Notes (Signed)
Office Note     CC:  follow up Requesting Provider:  Clinic, [name]  HPI: Jose [name] is a 62 y.o. (16-Mar-1961) male who presents to clinic as an urgent add-on due to increasing pain and tissue changes to the right transmetatarsal amputation.  He underwent right lower extremity arteriogram with right popliteal artery shockwave lithotripsy and drug-coated balloon angioplasty by Dr. Randie Heinz on 04/27/2023.  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  He went to the emergency department with generalized weakness on 05/23/2023 however was not admitted.  Since that time he has had poor oral intake and possibly feverish in addition to worsening symptoms of his right foot.  He continues to take his aspirin, Plavix, statin daily.  Past medical history also significant for CAD, CKD, insulin-dependent diabetes mellitus, hypertension.  He lives alone however was brought to the office by his friend.   Past Medical History:  Diagnosis Date   Alcohol abuse    CAD (coronary artery disease)    a. Reported MI in 2012 s/p 2 stents;  b. 08/2012 Cath: LM 20, LAD 20 diff ISR, jailed septal - 99%, LCX 30ost, RI large, min irregs, RCA 30p, 20-30 ISR-->Med Rx; c. 2017 Pt [facility].   CKD (chronic kidney disease), stage III (HCC)    Cocaine abuse (HCC)    COPD (chronic obstructive pulmonary disease) (HCC)    Depression    Diabetes mellitus without complication (HCC)    Hyperlipidemia    Hypertension    Hypertensive urgency 02/11/2021   Noncompliance     Past Surgical History:  Procedure Laterality Date   ABDOMINAL AORTOGRAM W/LOWER EXTREMITY N/A 04/27/2023   Procedure: ABDOMINAL AORTOGRAM W/LOWER EXTREMITY;  Surgeon: Maeola Harman, MD;  Location: [facility] INVASIVE CV LAB;  Service: Cardiovascular;  Laterality: N/A;   CARDIAC CATHETERIZATION     CORONARY STENT PLACEMENT     LEFT HEART CATH AND  CORONARY ANGIOGRAPHY N/A 08/22/2020   Procedure: LEFT HEART CATH AND CORONARY ANGIOGRAPHY;  Surgeon: Lyn Records, MD;  Location: [facility] INVASIVE CV LAB;  Service: Cardiovascular;  Laterality: N/A;   LEFT HEART CATH AND CORONARY ANGIOGRAPHY N/A 01/28/2022   Procedure: LEFT HEART CATH AND CORONARY ANGIOGRAPHY;  Surgeon: Lennette Bihari, MD;  Location: [facility] INVASIVE CV LAB;  Service: Cardiovascular;  Laterality: N/A;   LEFT HEART CATHETERIZATION WITH CORONARY ANGIOGRAM Bilateral 08/25/2012   Procedure: LEFT HEART CATHETERIZATION WITH CORONARY ANGIOGRAM;  Surgeon: Kathleene Hazel, MD;  Location: [facility] CATH LAB;  Service: Cardiovascular;  Laterality: Bilateral;   PERIPHERAL INTRAVASCULAR LITHOTRIPSY  04/27/2023   Procedure: PERIPHERAL INTRAVASCULAR LITHOTRIPSY;  Surgeon: Maeola Harman, MD;  Location: [facility] INVASIVE CV LAB;  Service: Cardiovascular;;   PERIPHERAL VASCULAR BALLOON ANGIOPLASTY  04/27/2023   Procedure: PERIPHERAL VASCULAR BALLOON ANGIOPLASTY;  Surgeon: Maeola Harman, MD;  Location: [facility] INVASIVE CV LAB;  Service: Cardiovascular;;   TRANSMETATARSAL AMPUTATION Right 05/12/2023   Procedure: TRANSMETATARSAL AMPUTATION;  Surgeon: Chuck Hint, MD;  Location: [facility] OR;  Service: Vascular;  Laterality: Right;    Social History   Socioeconomic History   Marital status: Married    Spouse name: Not on file   Number of children: Not on file   Years of education: Not on file   Highest education level: Not on file  Occupational History   Not on file  Tobacco Use   Smoking status: Former    Types: Cigarettes    Quit date:  11/25/2007    Years since quitting: 15.5   Smokeless tobacco: Never  Vaping Use   Vaping Use: Never used  Substance and Sexual Activity   Alcohol use: Yes    Comment: Used to drink heavily - says currently 2 beers/day.   Drug use: Yes    Types: Marijuana    Comment: reports not using cocaine   Sexual activity: Yes    Birth control/protection: None   Other Topics Concern   Not on file  Social History Narrative   Lives in [place] by himself.  Does not work or routinely exercise.   Social Determinants of Health   Financial Resource Strain: Not on file  Food Insecurity: No Food Insecurity (05/09/2023)   Hunger Vital Sign    Worried About Running Out of Food in the Last Year: Never true    Ran Out of Food in the Last Year: Never true  Transportation Needs: No Transportation Needs (05/09/2023)   PRAPARE - Administrator, Civil Service (Medical): No    Lack of Transportation (Non-Medical): No  Physical Activity: Not on file  Stress: Not on file  Social Connections: Not on file  Intimate Partner Violence: Not At Risk (05/09/2023)   Humiliation, Afraid, Rape, and Kick questionnaire    Fear of Current or Ex-Partner: No    Emotionally Abused: No    Physically Abused: No    Sexually Abused: No    Family History  Problem Relation Age of Onset   Heart disease Mother        MI 37s    Current Outpatient Medications  Medication Sig Dispense Refill   acetaminophen (TYLENOL) 325 MG tablet Take 650 mg by mouth every 4 (four) hours as needed for mild pain, moderate pain or headache.     albuterol (PROVENTIL HFA;VENTOLIN HFA) 108 (90 BASE) MCG/ACT inhaler Inhale 2 puffs into the lungs every 6 (six) hours as needed for wheezing or shortness of breath.      Alogliptin Benzoate 25 MG TABS Take 12.5 mg by mouth daily. For diabetes     amitriptyline (ELAVIL) 25 MG tablet Take 1 tablet (25 mg total) by mouth at bedtime. 30 tablet 1   amLODipine (NORVASC) 10 MG tablet Take 1 tablet (10 mg total) by mouth daily. 90 tablet 3   aspirin EC 81 MG tablet Take 81 mg by mouth daily. Swallow whole.     atorvastatin (LIPITOR) 80 MG tablet Take 1 tablet (80 mg total) by mouth daily. (Patient taking differently: Take 80 mg by mouth every evening.) 30 tablet 2   Brinzolamide-Brimonidine 1-0.2 % SUSP Place 1 drop into both eyes 3 (three) times daily.      carvedilol (COREG) 12.5 MG tablet Take 12.5 mg by mouth 2 (two) times daily with a meal.     clopidogrel (PLAVIX) 75 MG tablet Take 1 tablet (75 mg total) by mouth daily with breakfast. 30 tablet 5   cyanocobalamin (VITAMIN B12) 500 MCG tablet Take 500 mcg by mouth daily.     Dextrose-Fructose-Sod Citrate 4.35-4.17-0.921 GM/15ML LIQD Take 15 cm by mouth as needed (15cm (1 tube) by mouth as needed for hypoglycemia for  glucose less than 70, repeat every 15 minutes if blood sugar less than 70.).     fluticasone-salmeterol (ADVAIR) 500-50 MCG/ACT AEPB Inhale 1 puff into the lungs in the morning and at bedtime.     folic acid (FOLVITE) 1 MG tablet Take 1 tablet (1 mg total) by mouth daily.  furosemide (LASIX) 20 MG tablet Take 1 tablet by mouth every other day.     gabapentin (NEURONTIN) 300 MG capsule Take 300 mg by mouth every evening.     hydrALAZINE (APRESOLINE) 100 MG tablet Take 1 tablet (100 mg total) by mouth 3 (three) times daily. (Patient taking differently: Take 100 mg by mouth 2 (two) times daily.) 90 tablet 11   hydrOXYzine (ATARAX) 25 MG tablet Take 1 tablet (25 mg total) by mouth 3 (three) times daily as needed for itching or anxiety. 20 tablet 0   hydrOXYzine (VISTARIL) 25 MG capsule Take 25 mg by mouth daily. Itching.     insulin glargine (LANTUS) 100 UNIT/ML injection Inject 0.3 mLs (30 Units total) into the skin daily. Expires 28 days after first use 10 mL 0   Insulin Syringe-Needle U-100 30G X 1/2" 0.5 ML MISC Use with Lantus 30 each 0   ipratropium-albuterol (DUONEB) 0.5-2.5 (3) MG/3ML SOLN Take 3 mLs by nebulization every 6 (six) hours as needed (Use 1 vial in nebulizer four times daily).     latanoprost (XALATAN) 0.005 % ophthalmic solution Place 1 drop into both eyes at bedtime.      magnesium oxide (MAG-OX) 400 (240 Mg) MG tablet Take 1 tablet (400 mg total) by mouth daily.     methocarbamol (ROBAXIN) 500 MG tablet Take 1 tablet (500 mg total) by mouth every 6 (six) hours as  needed for muscle spasms. 20 tablet 0   Multiple Vitamin (MULTIVITAMIN WITH MINERALS) TABS tablet Take 1 tablet by mouth daily.     naloxone (NARCAN) nasal spray 4 mg/0.1 mL Place 1 spray into the nose once. Instill 1 spray into one nostril as directed for opioid overdose, call 911 immediately, administer dose, then turn person on side if no response in 2-3 minutes or person responds but relapses. Repeat using a new spray device and into the other nostril.     nitroGLYCERIN (NITROSTAT) 0.4 MG SL tablet Place 1 tablet (0.4 mg total) under the tongue every 5 (five) minutes as needed for chest pain. 25 tablet 2   Omega-3 Fatty Acids (FISH OIL) 1000 MG CAPS Take 1,000 mg by mouth 2 (two) times daily.     omeprazole (PRILOSEC) 40 MG capsule Take 40 mg by mouth 2 (two) times daily. Take one an empty stomach 30 minutes prior to a meal.     ondansetron (ZOFRAN) 4 MG tablet Take 1 tablet (4 mg total) by mouth every 6 (six) hours as needed for nausea. 20 tablet 0   Oxycodone HCl 10 MG TABS Take 1 tablet (10 mg total) by mouth every 4 (four) hours as needed for severe pain. 20 tablet 0   oxyCODONE-acetaminophen (PERCOCET/ROXICET) 5-325 MG tablet Take 1 tablet by mouth every 6 (six) hours as needed for severe pain. 15 tablet 0   polyethylene glycol (MIRALAX / GLYCOLAX) 17 g packet Take 17 g by mouth 2 (two) times daily. 14 each 0   senna-docusate (SENOKOT-S) 8.6-50 MG tablet Take 1 tablet by mouth 2 (two) times daily for 14 days. 28 tablet 0   tadalafil (CIALIS) 10 MG tablet Take 1 tablet (10 mg total) by mouth daily as needed for erectile dysfunction. 10 tablet 0   thiamine (VITAMIN B-1) 100 MG tablet Take 1 tablet (100 mg total) by mouth daily.     No current facility-administered medications for this visit.    Allergies  Allergen Reactions   Lisinopril-Hydrochlorothiazide Swelling and Other (See Comments)    Causes swelling  of lips   Simvastatin Other (See Comments)    ALT (SGPT) level raised, Aspartate  aminotransferase serum level raised     REVIEW OF SYSTEMS:   [X]  denotes positive finding, [ ]  denotes negative finding Cardiac  Comments:  Chest pain or chest pressure:    Shortness of breath upon exertion:    Short of breath when lying flat:    Irregular heart rhythm:        Vascular    Pain in calf, thigh, or hip brought on by ambulation:    Pain in feet at night that wakes you up from your sleep:     Blood clot in your veins:    Leg swelling:         Pulmonary    Oxygen at home:    Productive cough:     Wheezing:         Neurologic    Sudden weakness in arms or legs:     Sudden numbness in arms or legs:     Sudden onset of difficulty speaking or slurred speech:    Temporary loss of vision in one eye:     Problems with dizziness:         Gastrointestinal    Blood in stool:     Vomited blood:         Genitourinary    Burning when urinating:     Blood in urine:        Psychiatric    Major depression:         Hematologic    Bleeding problems:    Problems with blood clotting too easily:        Skin    Rashes or ulcers:        Constitutional    Fever or chills:      PHYSICAL EXAMINATION:  Vitals:   05/27/23 1408  BP: 96/66  Pulse: (!) 108  Temp: 98.2 F (36.8 C)  TempSrc: Temporal  SpO2: 98%    General:  WDWN in NAD; vital signs documented above Gait: Not observed HENT: WNL, normocephalic Pulmonary: normal non-labored breathing , without Rales, rhonchi,  wheezing Cardiac: regular HR Abdomen: soft, NT, no masses Skin: without rashes Vascular Exam/Pulses: Monophasic right DP, PT, peroneal Extremities: Necrotic TMA, warm to touch, severe pain with light touch Musculoskeletal: no muscle wasting or atrophy  Neurologic: A&O X 3 Psychiatric:  The pt has Normal affect.     ASSESSMENT/PLAN:: 62 y.o. male who presents to clinic as an urgent add-on due to increasing pain and tissue loss of the right TMA site  -Mr. Maicol Bobick is a 62 year old male  who underwent shockwave lithotripsy and balloon angioplasty of the right popliteal artery about a month ago due to gangrenous toes.  2 weeks ago he underwent right TMA due to worsening tissue loss.  He presented to the emergency department on 05/23/2023 with generalized weakness and poor oral intake.  Today he comes in with severe pain in his right foot and worsening tissue loss pictured above.  He continues to have poor oral intake.  Vital signs show tachycardia with soft blood pressure.  He appears ill on exam today.  I am worried he has developed a systemic infection.  I recommended he present to the emergency department for potential admission.  We also discussed the high likelihood of requiring a right below the knee amputation during hospitalization.  He is agreeable to present to the [facility] ER this afternoon.   Emilie Rutter,  PA-C [facility] [phone]  Clinic MD:  Edilia Bo

## 2023-05-27 NOTE — ED Notes (Signed)
Multiple attempts to obtain IV acces unsuccessful, IV team order placed.

## 2023-05-27 NOTE — ED Notes (Signed)
Shift report received from Josh RN  

## 2023-05-27 NOTE — Telephone Encounter (Signed)
Pt called stating that he went to urgent care and they wouldn't do anything for him. He stated that he was on the way to this office.  Spoke to the PA's in office to inform them and they confirmed that since Dr. Edilia Bo wanted the pt to be seen, he could be added to the schedule.  Called pt, pt stated he was just outside office. Told him to come in and check in. He could be seen today.

## 2023-05-27 NOTE — ED Notes (Signed)
RN able to collect 1 set blood cultures. Unable to collect second set of blood cultures, Lab tech notified of need for 2nd set of blood cultures.

## 2023-05-27 NOTE — ED Provider Notes (Signed)
[place] EMERGENCY DEPARTMENT AT [facility] Provider Note   CSN: [id number] Arrival date & time: 05/27/23  1458     History  Chief Complaint  Patient presents with   Foot Pain    Infection     Jari Favre Junior Mansker is a 62 y.o. 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  Reports he has also been very dizzy at home and having low blood pressures.  Normally has high blood pressures.  Also endorses chills at home.  Was seen by vascular surgery earlier today in clinic and they recommended he come to the ER for IV antibiotics and admission given high likelihood of requiring a BKA and concern for systemic infection.   Foot Pain       Home Medications Prior to Admission medications   Medication Sig Start Date End Date Taking? Authorizing Provider  acetaminophen (TYLENOL) 325 MG tablet Take 650 mg by mouth every 4 (four) hours as needed for mild pain, moderate pain or headache.    [provider]  albuterol (PROVENTIL HFA;VENTOLIN HFA) 108 (90 BASE) MCG/ACT inhaler Inhale 2 puffs into the lungs every 6 (six) hours as needed for wheezing or shortness of breath.     [provider]  Alogliptin Benzoate 25 MG TABS Take 12.5 mg by mouth daily. For diabetes    [provider]  amitriptyline (ELAVIL) 25 MG tablet Take 1 tablet (25 mg total) by mouth at bedtime. 05/15/23   Rodolph Bong, MD  amLODipine (NORVASC) 10 MG tablet Take 1 tablet (10 mg total) by mouth daily. 03/12/21   [name], Deloris Ping, MD  aspirin EC 81 MG tablet Take 81 mg by mouth daily. Swallow whole.    [provider]  atorvastatin (LIPITOR) 80 MG tablet Take 1 tablet (80 mg total) by mouth daily. Patient taking differently: Take 80 mg by mouth every evening. 01/30/22   Marjie Skiff E, PA-C  Brinzolamide-Brimonidine 1-0.2 % SUSP Place 1 drop into both  eyes 3 (three) times daily.    [provider]  carvedilol (COREG) 12.5 MG tablet Take 12.5 mg by mouth 2 (two) times daily with a meal.    [provider]  clopidogrel (PLAVIX) 75 MG tablet Take 1 tablet (75 mg total) by mouth daily with breakfast. 01/30/22   Marjie Skiff E, PA-C  cyanocobalamin (VITAMIN B12) 500 MCG tablet Take 500 mcg by mouth daily.    [provider]  Dextrose-Fructose-Sod Citrate 4.35-4.17-0.921 GM/15ML LIQD Take 15 cm by mouth as needed (15cm (1 tube) by mouth as needed for hypoglycemia for  glucose less than 70, repeat every 15 minutes if blood sugar less than 70.).    [provider]  fluticasone-salmeterol (ADVAIR) 500-50 MCG/ACT AEPB Inhale 1 puff into the lungs in the morning and at bedtime. 04/30/23   Lanae Boast, MD  folic acid (FOLVITE) 1 MG tablet Take 1 tablet (1 mg total) by mouth daily. 05/16/23   Rodolph Bong, MD  furosemide (LASIX) 20 MG tablet Take 1 tablet by mouth every other day. 09/21/20   [provider]  gabapentin (NEURONTIN) 300 MG capsule Take 300 mg by mouth every evening.    [provider]  hydrALAZINE (APRESOLINE) 100 MG tablet Take 1 tablet (100 mg total) by mouth 3 (three) times daily. Patient taking differently: Take 100 mg by mouth 2 (two) times daily. 03/12/21  [name], Deloris Ping, MD  hydrOXYzine (ATARAX) 25 MG tablet Take 1 tablet (25 mg total) by mouth 3 (three) times daily as needed for itching or anxiety. 05/15/23   Rodolph Bong, MD  hydrOXYzine (VISTARIL) 25 MG capsule Take 25 mg by mouth daily. Itching.    [provider]  insulin glargine (LANTUS) 100 UNIT/ML injection Inject 0.3 mLs (30 Units total) into the skin daily. Expires 28 days after first use 05/15/23   Rodolph Bong, MD  Insulin Syringe-Needle U-100 30G X 1/2" 0.5 ML MISC Use with Lantus 05/15/23   Rodolph Bong, MD  ipratropium-albuterol (DUONEB) 0.5-2.5 (3) MG/3ML SOLN Take 3 mLs by nebulization  every 6 (six) hours as needed (Use 1 vial in nebulizer four times daily).    [provider]  latanoprost (XALATAN) 0.005 % ophthalmic solution Place 1 drop into both eyes at bedtime.  04/11/15   [provider]  magnesium oxide (MAG-OX) 400 (240 Mg) MG tablet Take 1 tablet (400 mg total) by mouth daily. 08/12/22   Bing Neighbors, NP  methocarbamol (ROBAXIN) 500 MG tablet Take 1 tablet (500 mg total) by mouth every 6 (six) hours as needed for muscle spasms. 05/15/23   Rodolph Bong, MD  Multiple Vitamin (MULTIVITAMIN WITH MINERALS) TABS tablet Take 1 tablet by mouth daily.    [provider]  naloxone [facility]) nasal spray 4 mg/0.1 mL Place 1 spray into the nose once. Instill 1 spray into one nostril as directed for opioid overdose, call 911 immediately, administer dose, then turn person on side if no response in 2-3 minutes or person responds but relapses. Repeat using a new spray device and into the other nostril.    [provider]  nitroGLYCERIN (NITROSTAT) 0.4 MG SL tablet Place 1 tablet (0.4 mg total) under the tongue every 5 (five) minutes as needed for chest pain. 01/29/22   Corrin Parker, PA-C  Omega-3 Fatty Acids (FISH OIL) 1000 MG CAPS Take 1,000 mg by mouth 2 (two) times daily.    [provider]  omeprazole (PRILOSEC) 40 MG capsule Take 40 mg by mouth 2 (two) times daily. Take one an empty stomach 30 minutes prior to a meal.    [provider]  ondansetron (ZOFRAN) 4 MG tablet Take 1 tablet (4 mg total) by mouth every 6 (six) hours as needed for nausea. 05/15/23   Rodolph Bong, MD  Oxycodone HCl 10 MG TABS Take 1 tablet (10 mg total) by mouth every 4 (four) hours as needed for severe pain. 05/15/23   Rodolph Bong, MD  oxyCODONE-acetaminophen (PERCOCET/ROXICET) 5-325 MG tablet Take 1 tablet by mouth every 6 (six) hours as needed for severe pain. 05/23/23   Jeanelle Malling, PA  polyethylene glycol (MIRALAX / GLYCOLAX) 17 g packet  Take 17 g by mouth 2 (two) times daily. 04/30/23   Lanae Boast, MD  senna-docusate (SENOKOT-S) 8.6-50 MG tablet Take 1 tablet by mouth 2 (two) times daily for 14 days. 05/15/23 05/30/23  Rodolph Bong, MD  tadalafil (CIALIS) 10 MG tablet Take 1 tablet (10 mg total) by mouth daily as needed for erectile dysfunction. 10/21/22   Sharlene Dory, PA-C  thiamine (VITAMIN B-1) 100 MG tablet Take 1 tablet (100 mg total) by mouth daily. 05/16/23   Rodolph Bong, MD      Allergies    Lisinopril-hydrochlorothiazide and Simvastatin    Review of Systems   Review of Systems  Constitutional:  Positive for chills.  Negative for fever.  Musculoskeletal:        Right foot pain    Physical Exam Updated Vital Signs BP (!) 154/87 (BP Location: Left Arm)   Pulse 87   Temp 97.7 F (36.5 C) (Oral)   Resp 18   Ht 5\' 11"  (1.803 m)   Wt 81.6 kg   SpO2 100%   BMI 25.10 kg/m  Physical Exam Constitutional:      General: He is not in acute distress.    Comments: Appears to be in pain  Cardiovascular:     Rate and Rhythm: Normal rate and regular rhythm.     Heart sounds: No murmur heard.    Comments: Dorsalis pedis 2+ Pulmonary:     Effort: Pulmonary effort is normal.  Musculoskeletal:     Comments: Right foot with toe amputation incision closed with staples with surrounding edema and erythema.  Some crusted blood around the staples Right distal foot tender to palpation  Neurological:     Mental Status: He is alert.     Comments: Right foot sensation intact            ED Results / Procedures / Treatments   Labs (all labs ordered are listed, but only abnormal results are displayed) Labs Reviewed  COMPREHENSIVE METABOLIC PANEL - Abnormal; Notable for the following components:      Result Value   Sodium 132 (*)    Potassium 3.4 (*)    CO2 15 (*)    Glucose, Bld 174 (*)    BUN 25 (*)    Creatinine, Ser 1.73 (*)    Albumin 3.3 (*)    GFR, Estimated 44 (*)    All other components  within normal limits  CBC WITH DIFFERENTIAL/PLATELET - Abnormal; Notable for the following components:   Hemoglobin 12.1 (*)    HCT 33.5 (*)    MCV 75.5 (*)    MCHC 36.1 (*)    All other components within normal limits  CULTURE, BLOOD (ROUTINE X 2)  CULTURE, BLOOD (ROUTINE X 2)  LACTIC ACID, PLASMA    EKG None  Radiology DG Foot Complete Right  Result Date: 05/27/2023 CLINICAL DATA:  Recent toe amputation, pain.  Possible infection. EXAM: RIGHT FOOT COMPLETE - 3+ VIEW COMPARISON:  Foot radiographs 04/24/2023. FINDINGS: There has been interval transmetatarsal amputation of all 5 digits. The amputation margins are smooth. There is no cortical irregularity or osseous erosion/destruction. There is soft tissue swelling overlying the amputation site. Extensive vascular calcifications are noted. Postsurgical changes are partially imaged in the tibia. There is mild inferior calcaneal spurring and Achilles enthesopathy. IMPRESSION: Interval transmetatarsal amputation of all 5 digits with overlying soft tissue swelling but no other acute finding. No osseous erosion or destruction. Electronically Signed   By: Lesia Hausen M.D.   On: 05/27/2023 15:48    Procedures Procedures    Medications Ordered in ED Medications  cefTRIAXone (ROCEPHIN) 2 g in sodium chloride 0.9 % 100 mL IVPB (has no administration in time range)    And  metroNIDAZOLE (FLAGYL) IVPB 500 mg (has no administration in time range)  morphine (PF) 4 MG/ML injection 4 mg (4 mg Intravenous Given 05/27/23 2139)    ED Course/ Medical Decision Making/ A&P                             Medical Decision Making Amount and/or Complexity of Data Reviewed Labs: ordered. Radiology: ordered.   62  y.o. 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  He notes feeling dizzy and having chills at home.  He was seen by vascular surgery earlier earlier today and they have concern for systemic infection and possible need for BKA.  He was instructed to come to the ER for IV antibiotics and admission.  X-ray right foot shows soft tissue swelling but no other acute findings.  Less concern for osteomyelitis.  He has no leukocytosis, blood pressures have been elevated here, less concern for sepsis at this time.  No fever. He was given IV morphine for pain and started on metronidazole and ceftriaxone.   Impression: Cellulitis of the right foot  Disposition:  Admitted. The case was discussed with the admitting physician Dr. Allena Katz   Lab Tests: I Ordered, and personally interpreted labs.  The pertinent results include:   CBC with no leukocytosis CBC with CO2 of 15, potassium mildly low at 3.4, otherwise patient values at baseline Normal lactic acid  Imaging Studies ordered: I ordered imaging studies including x-ray right foot I independently visualized the imaging with scope of interpretation limited to determining acute life threatening conditions related to emergency care. Imaging showed soft tissue swelling with no obvious bony changes I agree with the radiologist interpretation   Cardiac Monitoring: / EKG: Not indicated   Consultations Obtained: I requested consultation with the hospitalist Dr. Allena Katz,  and discussed lab and imaging findings as well as pertinent plan - they recommend: admission    External records from outside source obtained and reviewed including EMR records from vascular surgery visits    Co morbidities that complicate the patient evaluation  Diabetes on insulin, chronic kidney disease, hypertension, COPD  Social Determinants of  Health:  unknown              Final Clinical Impression(s) / ED Diagnoses Final diagnoses:  Cellulitis of other specified site    Rx / DC Orders ED Discharge Orders     None         Arabella Merles, Cordelia Poche 05/27/23 2158    Linwood Dibbles, MD 05/28/23 1501

## 2023-05-27 NOTE — H&P (Signed)
History and Physical    Jose [name] WUJ:[id number] DOB: 11-13-1961 DOA: 05/27/2023  PCP: [name]  Patient coming from: Home  I have personally briefly reviewed patient's old medical records in [facility]  Chief Complaint: Pain at right transmetatarsal amputation site  HPI: Jose [name] is a 62 y.o. male with medical history significant for CAD s/p PCI, COPD, CKD stage IIIb, T2DM, HTN, HLD, PAD s/p right TMA 05/12/2023 who presented to the ED for evaluation of increasing pain and tissue loss at the right TMA site.  Patient previously underwent shockwave lithotripsy and balloon angioplasty of the right popliteal artery on 04/27/2023 by vascular surgery Dr. Randie Heinz due to gangrenous toes.  He was readmitted 6/15-6/24 due to progressive dry gangrene.  He underwent right sided transmetatarsal amputation by Dr. Edilia Bo on 05/12/2023.  There was concern for superimposed infection and patient was treated with IV Rocephin in hospital and discharged on 1 week of Augmentin and doxycycline.  Patient states that he has been having uncontrolled pain at the surgical wound site.  He ran out of his pain medications 3 days ago and return to the vascular surgery clinic today for further evaluation and for medication refill.  Patient was noted to have worsening tissue loss at the surgical wound site as well as soft BP 96/66 and mild tachycardia 108.  Patient was advised to present to the ED for evaluation in anticipation of admission.  They discussed potential need for right below the knee amputation.  Patient states that pain seems to be easing after receiving IV narcotics while in the ED.  He denies any fevers, diaphoresis, chest pain, dyspnea.  Has had occasional chills.  He has not seen any significant discharge from his surgical wound site.  He reports seeing blood on surgical dressing when it was unwrapped in the clinic today.  He is tearful due to concern for needing further  surgery.  ED Course  Labs/Imaging on admission: I have personally reviewed following labs and imaging studies.  Initial vitals showed BP 145/93, pulse 102, RR 17, temp 97.7 F, SpO2 100% on room air.  Labs showed WBC 8.0, hemoglobin 12.1, platelets 268,000, sodium 132, potassium 3.4, bicarb 15, BUN 25, creatinine 1.73, serum glucose 174, LFTs within normal limits.  Blood cultures ordered and pending collection.  Lactic acid 1.8.  Right foot x-ray shows interval transmetatarsal amputation of all 5 digits with overlying soft tissue swelling but no other acute finding.  No osseous erosion or destruction.  Patient was given IV ceftriaxone and Flagyl, IV morphine 4 mg.  Vascular surgery consulted and recommended medical admission.  The hospitalist service was consulted to admit for further evaluation and management.  Review of Systems: All systems reviewed and are negative except as documented in history of present illness above.   Past Medical History:  Diagnosis Date   Alcohol abuse    CAD (coronary artery disease)    a. Reported MI in 2012 s/p 2 stents;  b. 08/2012 Cath: LM 20, LAD 20 diff ISR, jailed septal - 99%, LCX 30ost, RI large, min irregs, RCA 30p, 20-30 ISR-->Med Rx; c. 2017 Pt [facility].   CKD (chronic kidney disease), stage III (HCC)    Cocaine abuse (HCC)    COPD (chronic obstructive pulmonary disease) (HCC)    Depression    Diabetes mellitus without complication (HCC)    Hyperlipidemia    Hypertension    Hypertensive urgency 02/11/2021   Noncompliance  Past Surgical History:  Procedure Laterality Date   ABDOMINAL AORTOGRAM W/LOWER EXTREMITY N/A 04/27/2023   Procedure: ABDOMINAL AORTOGRAM W/LOWER EXTREMITY;  Surgeon: Maeola Harman, MD;  Location: [facility] INVASIVE CV LAB;  Service: Cardiovascular;  Laterality: N/A;   CARDIAC CATHETERIZATION     CORONARY STENT PLACEMENT     LEFT HEART CATH AND CORONARY ANGIOGRAPHY N/A 08/22/2020   Procedure:  LEFT HEART CATH AND CORONARY ANGIOGRAPHY;  Surgeon: Lyn Records, MD;  Location: [facility] INVASIVE CV LAB;  Service: Cardiovascular;  Laterality: N/A;   LEFT HEART CATH AND CORONARY ANGIOGRAPHY N/A 01/28/2022   Procedure: LEFT HEART CATH AND CORONARY ANGIOGRAPHY;  Surgeon: Lennette Bihari, MD;  Location: [facility] INVASIVE CV LAB;  Service: Cardiovascular;  Laterality: N/A;   LEFT HEART CATHETERIZATION WITH CORONARY ANGIOGRAM Bilateral 08/25/2012   Procedure: LEFT HEART CATHETERIZATION WITH CORONARY ANGIOGRAM;  Surgeon: Kathleene Hazel, MD;  Location: [facility] CATH LAB;  Service: Cardiovascular;  Laterality: Bilateral;   PERIPHERAL INTRAVASCULAR LITHOTRIPSY  04/27/2023   Procedure: PERIPHERAL INTRAVASCULAR LITHOTRIPSY;  Surgeon: Maeola Harman, MD;  Location: [facility] INVASIVE CV LAB;  Service: Cardiovascular;;   PERIPHERAL VASCULAR BALLOON ANGIOPLASTY  04/27/2023   Procedure: PERIPHERAL VASCULAR BALLOON ANGIOPLASTY;  Surgeon: Maeola Harman, MD;  Location: [facility] INVASIVE CV LAB;  Service: Cardiovascular;;   TRANSMETATARSAL AMPUTATION Right 05/12/2023   Procedure: TRANSMETATARSAL AMPUTATION;  Surgeon: Chuck Hint, MD;  Location: [facility] OR;  Service: Vascular;  Laterality: Right;    Social History:  reports that he quit smoking about 15 years ago. His smoking use included cigarettes. He has never used smokeless tobacco. He reports current alcohol use. He reports current drug use. Drug: Marijuana.  Allergies  Allergen Reactions   Lisinopril-Hydrochlorothiazide Swelling and Other (See Comments)    Causes swelling of lips   Simvastatin Other (See Comments)    ALT (SGPT) level raised, Aspartate aminotransferase serum level raised    Family History  Problem Relation Age of Onset   Heart disease Mother        MI 69s     Prior to Admission medications   Medication Sig Start Date End Date Taking? Authorizing Provider  acetaminophen (TYLENOL) 325 MG tablet Take 650 mg by mouth every 4 (four)  hours as needed for mild pain, moderate pain or headache.    [provider]  albuterol (PROVENTIL HFA;VENTOLIN HFA) 108 (90 BASE) MCG/ACT inhaler Inhale 2 puffs into the lungs every 6 (six) hours as needed for wheezing or shortness of breath.     [provider]  Alogliptin Benzoate 25 MG TABS Take 12.5 mg by mouth daily. For diabetes    [provider]  amitriptyline (ELAVIL) 25 MG tablet Take 1 tablet (25 mg total) by mouth at bedtime. 05/15/23   Rodolph Bong, MD  amLODipine (NORVASC) 10 MG tablet Take 1 tablet (10 mg total) by mouth daily. 03/12/21   [name], Deloris Ping, MD  aspirin EC 81 MG tablet Take 81 mg by mouth daily. Swallow whole.    [provider]  atorvastatin (LIPITOR) 80 MG tablet Take 1 tablet (80 mg total) by mouth daily. Patient taking differently: Take 80 mg by mouth every evening. 01/30/22   Marjie Skiff E, PA-C  Brinzolamide-Brimonidine 1-0.2 % SUSP Place 1 drop into both eyes 3 (three) times daily.    [provider]  carvedilol (COREG) 12.5 MG tablet Take 12.5 mg by mouth 2 (two) times daily with a meal.    [provider]  clopidogrel (PLAVIX)  75 MG tablet Take 1 tablet (75 mg total) by mouth daily with breakfast. 01/30/22   Marjie Skiff E, PA-C  cyanocobalamin (VITAMIN B12) 500 MCG tablet Take 500 mcg by mouth daily.    [provider]  Dextrose-Fructose-Sod Citrate 4.35-4.17-0.921 GM/15ML LIQD Take 15 cm by mouth as needed (15cm (1 tube) by mouth as needed for hypoglycemia for  glucose less than 70, repeat every 15 minutes if blood sugar less than 70.).    [provider]  fluticasone-salmeterol (ADVAIR) 500-50 MCG/ACT AEPB Inhale 1 puff into the lungs in the morning and at bedtime. 04/30/23   Lanae Boast, MD  folic acid (FOLVITE) 1 MG tablet Take 1 tablet (1 mg total) by mouth daily. 05/16/23   Rodolph Bong, MD  furosemide (LASIX) 20 MG tablet Take 1 tablet by mouth every other day. 09/21/20    [provider]  gabapentin (NEURONTIN) 300 MG capsule Take 300 mg by mouth every evening.    [provider]  hydrALAZINE (APRESOLINE) 100 MG tablet Take 1 tablet (100 mg total) by mouth 3 (three) times daily. Patient taking differently: Take 100 mg by mouth 2 (two) times daily. 03/12/21   [name], Deloris Ping, MD  hydrOXYzine (ATARAX) 25 MG tablet Take 1 tablet (25 mg total) by mouth 3 (three) times daily as needed for itching or anxiety. 05/15/23   Rodolph Bong, MD  hydrOXYzine (VISTARIL) 25 MG capsule Take 25 mg by mouth daily. Itching.    [provider]  insulin glargine (LANTUS) 100 UNIT/ML injection Inject 0.3 mLs (30 Units total) into the skin daily. Expires 28 days after first use 05/15/23   Rodolph Bong, MD  Insulin Syringe-Needle U-100 30G X 1/2" 0.5 ML MISC Use with Lantus 05/15/23   Rodolph Bong, MD  ipratropium-albuterol (DUONEB) 0.5-2.5 (3) MG/3ML SOLN Take 3 mLs by nebulization every 6 (six) hours as needed (Use 1 vial in nebulizer four times daily).    [provider]  latanoprost (XALATAN) 0.005 % ophthalmic solution Place 1 drop into both eyes at bedtime.  04/11/15   [provider]  magnesium oxide (MAG-OX) 400 (240 Mg) MG tablet Take 1 tablet (400 mg total) by mouth daily. 08/12/22   Bing Neighbors, NP  methocarbamol (ROBAXIN) 500 MG tablet Take 1 tablet (500 mg total) by mouth every 6 (six) hours as needed for muscle spasms. 05/15/23   Rodolph Bong, MD  Multiple Vitamin (MULTIVITAMIN WITH MINERALS) TABS tablet Take 1 tablet by mouth daily.    [provider]  naloxone [facility]) nasal spray 4 mg/0.1 mL Place 1 spray into the nose once. Instill 1 spray into one nostril as directed for opioid overdose, call 911 immediately, administer dose, then turn person on side if no response in 2-3 minutes or person responds but relapses. Repeat using a new spray device and into the other nostril.    [provider]   nitroGLYCERIN (NITROSTAT) 0.4 MG SL tablet Place 1 tablet (0.4 mg total) under the tongue every 5 (five) minutes as needed for chest pain. 01/29/22   Corrin Parker, PA-C  Omega-3 Fatty Acids (FISH OIL) 1000 MG CAPS Take 1,000 mg by mouth 2 (two) times daily.    [provider]  omeprazole (PRILOSEC) 40 MG capsule Take 40 mg by mouth 2 (two) times daily. Take one an empty stomach 30 minutes prior to a meal.    [provider]  ondansetron (ZOFRAN) 4 MG tablet Take 1 tablet (4  mg total) by mouth every 6 (six) hours as needed for nausea. 05/15/23   Rodolph Bong, MD  Oxycodone HCl 10 MG TABS Take 1 tablet (10 mg total) by mouth every 4 (four) hours as needed for severe pain. 05/15/23   Rodolph Bong, MD  oxyCODONE-acetaminophen (PERCOCET/ROXICET) 5-325 MG tablet Take 1 tablet by mouth every 6 (six) hours as needed for severe pain. 05/23/23   Jeanelle Malling, PA  polyethylene glycol (MIRALAX / GLYCOLAX) 17 g packet Take 17 g by mouth 2 (two) times daily. 04/30/23   Lanae Boast, MD  senna-docusate (SENOKOT-S) 8.6-50 MG tablet Take 1 tablet by mouth 2 (two) times daily for 14 days. 05/15/23 05/30/23  Rodolph Bong, MD  tadalafil (CIALIS) 10 MG tablet Take 1 tablet (10 mg total) by mouth daily as needed for erectile dysfunction. 10/21/22   Sharlene Dory, PA-C  thiamine (VITAMIN B-1) 100 MG tablet Take 1 tablet (100 mg total) by mouth daily. 05/16/23   Rodolph Bong, MD    Physical Exam: Vitals:   05/27/23 1505 05/27/23 2106 05/27/23 2317 05/27/23 2345  BP:  (!) 154/87 (!) 149/77 (!) 154/92  Pulse:  87 86 93  Resp:  18 18 16   Temp:  97.7 F (36.5 C) 97.7 F (36.5 C) 98.3 F (36.8 C)  TempSrc:  Oral Oral   SpO2:  100% 100% 100%  Weight: 81.6 kg     Height: 5\' 11"  (1.803 m)      Constitutional: Resting in bed, NAD, tearful Eyes: EOMI, lids and conjunctivae normal ENMT: Mucous membranes are moist. Posterior pharynx clear of any exudate or lesions.Normal dentition.  Neck:  normal, supple, no masses. Respiratory: clear to auscultation bilaterally, no wheezing, no crackles. Normal respiratory effort. No accessory muscle use.  Cardiovascular: Regular rate and rhythm, no murmurs / rubs / gallops. No extremity edema. Abdomen: no tenderness, no masses palpated.   Musculoskeletal: S/p right TMA, surgical wound as pictured below Skin: Right TMA surgical wound site with some necrotic tissue, no active discharge, see picture below Neurologic: Sensation intact. Strength 5/5 in all 4.  Psychiatric:  Alert and oriented x 3.        EKG: Not performed.  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 male with medical history significant for CAD s/p PCI, COPD, CKD stage IIIb, T2DM, HTN, HLD, PAD s/p right TMA 05/12/2023 who is admitted with increasing pain and tissue loss of the right TMA site.  Seen by vascular surgery who recommended medical admission and consideration of right BKA.  Assessment and Plan: PAD and dry gangrene of right foot s/p TMA 05/12/2023: Patient sent from vascular surgery clinic due to concern for nonhealing wound and possible superimposed infection.  They have discussed potential need for right BKA during hospitalization with patient.  Patient has been placed on empiric ceftriaxone and Flagyl.  Otherwise hemodynamically stable and afebrile. -Continue antibiotics with ceftriaxone and Flagyl for now -Follow blood cultures -Vascular surgery following -Continue analgesics as ordered and adjust  as needed  CKD stage IIIb: Renal  function stable.  Continue to monitor.  Type 2 diabetes: Placed on reduced basal insulin plus SSI.  Hypertension: Continue amlodipine and Coreg.  Coronary artery disease s/p PCI: Stable, continue aspirin, atorvastatin, Plavix, Coreg.  COPD: Stable, continue Advair and albuterol prn.  Hyperlipidemia: Continue atorvastatin.   DVT prophylaxis: heparin injection 5,000 Units Start: 05/27/23 2300 Code Status: Full code, confirmed with patient on admission Family Communication: Discussed with patient, he has discussed with his close friend Disposition Plan: From home, dispo pending clinical progress Consults called: Vascular surgery Severity of Illness: The appropriate patient status for this patient is INPATIENT. Inpatient status is judged to be reasonable and necessary in order to provide the required intensity of service to ensure the patient's safety. The patient's presenting symptoms, physical exam findings, and initial radiographic and laboratory data in the context of their chronic comorbidities is felt to place them at high risk for further clinical deterioration. Furthermore, it is not anticipated that the patient will be medically stable for discharge from the hospital within 2 midnights of admission.   * I certify that at the point of admission it is my clinical judgment that the patient will require inpatient hospital care spanning beyond 2 midnights from the point of admission due to high intensity of service, high risk for further deterioration and high frequency of surveillance required.Darreld Mclean MD [facility]  If 7PM-7AM, please contact night-coverage [email or web address]  05/28/2023, 12:05 AM

## 2023-05-27 NOTE — ED Triage Notes (Signed)
Patient arrives by POV states he was sent by the vascular doctor for right foot infection. Patient states last week he had toes amputated and having worsening pain.

## 2023-05-27 NOTE — Telephone Encounter (Signed)
Pt called requesting pain and itching medication to be sent to [facility] on [address]. He stated that he's been "crying himself to sleep every night".  Reviewed pt's chart, spoke to Dr. Edilia Bo who advised that pt be seen before prescribing any additional pain meds, returned call for clarification, no answer, lf vm.  Ron Parker, OT with [facility] called stating that he was at the pt's home and the pt was having severe pain and requested assistance.  Reviewed pt's chart, returned call for clarification, two identifiers used. Rosanne Ashing was still at pt's home and he was informed that the pt had been called, but no answer, so a vm was left on pt's phone. Instructed him to have pt go to nearest urgent care to be evaluated d/t severe pain. Pt confirmed understanding and called a friend to transport him.

## 2023-05-27 NOTE — ED Notes (Signed)
ED TO INPATIENT HANDOFF REPORT  ED Nurse Name and Phone #: Pearletha Forge RN   S Name/Age/Gender Jose [name] [name] 62 y.o. male Room/Bed: H011C/H011C  Code Status   Code Status: Prior  Home/SNF/Other Home Patient oriented to: self, place, time, and situation Is this baseline? Yes   Triage Complete: Triage complete  Chief Complaint Nonhealing surgical wound, initial encounter [T81.89XA]  Triage Note Patient arrives by POV states he was sent by the vascular doctor for right foot infection. Patient states last week he had toes amputated and having worsening pain.    Allergies Allergies  Allergen Reactions   Lisinopril-Hydrochlorothiazide Swelling and Other (See Comments)    Causes swelling of lips   Simvastatin Other (See Comments)    ALT (SGPT) level raised, Aspartate aminotransferase serum level raised    Level of Care/Admitting Diagnosis ED Disposition     ED Disposition  Admit   Condition  --   Comment  Hospital Area: [facility] [[id number]]  Level of Care: Med-Surg [16]  May admit patient to [facility] or [facility] if equivalent level of care is available:: No  Covid Evaluation: Asymptomatic - no recent exposure (last 10 days) testing not required  Diagnosis: Nonhealing surgical wound, initial encounter [161096]  Admitting Physician: Charlsie Quest [[id number]]  Attending Physician: Charlsie Quest [[id number]]  Certification:: I certify this patient will need inpatient services for at least 2 midnights  Estimated Length of Stay: 2          B Medical/Surgery History Past Medical History:  Diagnosis Date   Alcohol abuse    CAD (coronary artery disease)    a. Reported MI in 2012 s/p 2 stents;  b. 08/2012 Cath: LM 20, LAD 20 diff ISR, jailed septal - 99%, LCX 30ost, RI large, min irregs, RCA 30p, 20-30 ISR-->Med Rx; c. 2017 [facility]   CKD (chronic kidney disease), stage III (HCC)    Cocaine abuse (HCC)    COPD (chronic  obstructive pulmonary disease) (HCC)    Depression    Diabetes mellitus without complication (HCC)    Hyperlipidemia    Hypertension    Hypertensive urgency 02/11/2021   Noncompliance    Past Surgical History:  Procedure Laterality Date   ABDOMINAL AORTOGRAM W/LOWER EXTREMITY N/A 04/27/2023   Procedure: ABDOMINAL AORTOGRAM W/LOWER EXTREMITY;  Surgeon: Maeola Harman, MD;  Location: [facility] INVASIVE CV LAB;  Service: Cardiovascular;  Laterality: N/A;   CARDIAC CATHETERIZATION     CORONARY STENT PLACEMENT     LEFT HEART CATH AND CORONARY ANGIOGRAPHY N/A 08/22/2020   Procedure: LEFT HEART CATH AND CORONARY ANGIOGRAPHY;  Surgeon: Lyn Records, MD;  Location: [facility] INVASIVE CV LAB;  Service: Cardiovascular;  Laterality: N/A;   LEFT HEART CATH AND CORONARY ANGIOGRAPHY N/A 01/28/2022   Procedure: LEFT HEART CATH AND CORONARY ANGIOGRAPHY;  Surgeon: Lennette Bihari, MD;  Location: [facility] INVASIVE CV LAB;  Service: Cardiovascular;  Laterality: N/A;   LEFT HEART CATHETERIZATION WITH CORONARY ANGIOGRAM Bilateral 08/25/2012   Procedure: LEFT HEART CATHETERIZATION WITH CORONARY ANGIOGRAM;  Surgeon: Kathleene Hazel, MD;  Location: [facility] CATH LAB;  Service: Cardiovascular;  Laterality: Bilateral;   PERIPHERAL INTRAVASCULAR LITHOTRIPSY  04/27/2023   Procedure: PERIPHERAL INTRAVASCULAR LITHOTRIPSY;  Surgeon: Maeola Harman, MD;  Location: [facility] INVASIVE CV LAB;  Service: Cardiovascular;;   PERIPHERAL VASCULAR BALLOON ANGIOPLASTY  04/27/2023   Procedure: PERIPHERAL VASCULAR BALLOON ANGIOPLASTY;  Surgeon: Maeola Harman, MD;  Location: [facility] INVASIVE CV LAB;  Service: Cardiovascular;;   TRANSMETATARSAL AMPUTATION Right 05/12/2023   Procedure: TRANSMETATARSAL AMPUTATION;  Surgeon: Chuck Hint, MD;  Location: [facility] OR;  Service: Vascular;  Laterality: Right;     A IV Location/Drains/Wounds Patient Lines/Drains/Airways Status     Active Line/Drains/Airways     Name Placement date  Placement time Site Days   Peripheral IV 05/27/23 22 G 2.5" Anterior;Right Forearm 05/27/23  2123  Forearm  less than 1            Intake/Output Last 24 hours No intake or output data in the 24 hours ending 05/27/23 2214  Labs/Imaging Results for orders placed or performed during the hospital encounter of 05/27/23 (from the past 48 hour(s))  Lactic acid, plasma     Status: None   Collection Time: 05/27/23  3:18 PM  Result Value Ref Range   Lactic Acid, Venous 1.8 0.5 - 1.9 mmol/L    Comment: Performed at [facility], [address], [place], [place] [address]  Comprehensive metabolic panel     Status: Abnormal   Collection Time: 05/27/23  3:18 PM  Result Value Ref Range   Sodium 132 (L) 135 - 145 mmol/L   Potassium 3.4 (L) 3.5 - 5.1 mmol/L   Chloride 102 98 - 111 mmol/L   CO2 15 (L) 22 - 32 mmol/L   Glucose, Bld 174 (H) 70 - 99 mg/dL    Comment: Glucose reference range applies only to samples taken after fasting for at least 8 hours.   BUN 25 (H) 8 - 23 mg/dL   Creatinine, Ser 1.19 (H) 0.61 - 1.24 mg/dL   Calcium 9.8 8.9 - 14.7 mg/dL   Total Protein 6.7 6.5 - 8.1 g/dL   Albumin 3.3 (L) 3.5 - 5.0 g/dL   AST 24 15 - 41 U/L   ALT 31 0 - 44 U/L   Alkaline Phosphatase 72 38 - 126 U/L   Total Bilirubin 0.7 0.3 - 1.2 mg/dL   GFR, Estimated 44 (L) >60 mL/min    Comment: (NOTE) Calculated using the CKD-EPI Creatinine Equation (2021)    Anion gap 15 5 - 15    Comment: Performed at [facility], [address] [place], [place] [address]  CBC with Differential     Status: Abnormal   Collection Time: 05/27/23  3:18 PM  Result Value Ref Range   WBC 8.0 4.0 - 10.5 K/uL   RBC 4.44 4.22 - 5.81 MIL/uL   Hemoglobin 12.1 (L) 13.0 - 17.0 g/dL   HCT 21.3 (L) 08.6 - 57.8 %   MCV 75.5 (L) 80.0 - 100.0 fL   MCH 27.3 26.0 - 34.0 pg   MCHC 36.1 (H) 30.0 - 36.0 g/dL   RDW 46.9 62.9 - 52.8 %   Platelets 268 150 - 400 K/uL   nRBC 0.0 0.0 - 0.2 %   Neutrophils Relative % 63 %    Neutro Abs 5.1 1.7 - 7.7 K/uL   Lymphocytes Relative 28 %   Lymphs Abs 2.2 0.7 - 4.0 K/uL   Monocytes Relative 7 %   Monocytes Absolute 0.5 0.1 - 1.0 K/uL   Eosinophils Relative 2 %   Eosinophils Absolute 0.2 0.0 - 0.5 K/uL   Basophils Relative 0 %   Basophils Absolute 0.0 0.0 - 0.1 K/uL   Immature Granulocytes 0 %   Abs Immature Granulocytes 0.02 0.00 - 0.07 K/uL    Comment: Performed at [facility], [address] [place], [place]  [address]   DG Foot Complete Right  Result Date: 05/27/2023 CLINICAL DATA:  Recent toe amputation, pain.  Possible infection. EXAM: RIGHT FOOT COMPLETE - 3+ VIEW COMPARISON:  Foot radiographs 04/24/2023. FINDINGS: There has been interval transmetatarsal amputation of all 5 digits. The amputation margins are smooth. There is no cortical irregularity or osseous erosion/destruction. There is soft tissue swelling overlying the amputation site. Extensive vascular calcifications are noted. Postsurgical changes are partially imaged in the tibia. There is mild inferior calcaneal spurring and Achilles enthesopathy. IMPRESSION: Interval transmetatarsal amputation of all 5 digits with overlying soft tissue swelling but no other acute finding. No osseous erosion or destruction. Electronically Signed   By: Lesia Hausen M.D.   On: 05/27/2023 15:48    Pending Labs Unresulted Labs (From admission, onward)     Start     Ordered   05/27/23 2016  Blood Cultures x 2 sites  BLOOD CULTURE X 2,   STAT      05/27/23 2029            Vitals/Pain Today's Vitals   05/27/23 1503 05/27/23 1505 05/27/23 2106  BP: (!) 145/93  (!) 154/87  Pulse: (!) 102  87  Resp: 17  18  Temp: 97.7 F (36.5 C)  97.7 F (36.5 C)  TempSrc:   Oral  SpO2: 100%  100%  Weight:  81.6 kg   Height:  5\' 11"  (1.803 m)   PainSc:   9     Isolation Precautions No active isolations  Medications Medications  cefTRIAXone (ROCEPHIN) 2 g in sodium chloride 0.9 % 100 mL IVPB (has no  administration in time range)    And  metroNIDAZOLE (FLAGYL) IVPB 500 mg (has no administration in time range)  morphine (PF) 4 MG/ML injection 4 mg (4 mg Intravenous Given 05/27/23 2139)    Mobility walks with device     Focused Assessments Cardiac Assessment Handoff:    Lab Results  Component Value Date   CKTOTAL 541 (H) 08/25/2012   CKMB 8.1 (HH) 08/25/2012   TROPONINI 0.04 (HH) 05/14/2019   Lab Results  Component Value Date   DDIMER 0.74 (H) 06/16/2020   Does the Patient currently have chest pain? No    R Recommendations: See Admitting Provider Note  Report given to:   Additional Notes:

## 2023-05-28 ENCOUNTER — Encounter (HOSPITAL_COMMUNITY): Payer: Self-pay | Admitting: Internal Medicine

## 2023-05-28 DIAGNOSIS — T8189XA Other complications of procedures, not elsewhere classified, initial encounter: Secondary | ICD-10-CM | POA: Diagnosis not present

## 2023-05-28 LAB — BASIC METABOLIC PANEL
Anion gap: 13 (ref 5–15)
BUN: 23 mg/dL (ref 8–23)
CO2: 16 mmol/L — ABNORMAL LOW (ref 22–32)
Calcium: 9.8 mg/dL (ref 8.9–10.3)
Chloride: 106 mmol/L (ref 98–111)
Creatinine, Ser: 1.68 mg/dL — ABNORMAL HIGH (ref 0.61–1.24)
GFR, Estimated: 45 mL/min — ABNORMAL LOW (ref >60)
Glucose, Bld: 198 mg/dL — ABNORMAL HIGH (ref 70–99)
Potassium: 3.5 mmol/L (ref 3.5–5.1)
Sodium: 135 mmol/L (ref 135–145)

## 2023-05-28 LAB — CBC
HCT: 34.2 % — ABNORMAL LOW (ref 39.0–52.0)
Hemoglobin: 12.4 g/dL — ABNORMAL LOW (ref 13.0–17.0)
MCH: 28 pg (ref 26.0–34.0)
MCHC: 36.3 g/dL — ABNORMAL HIGH (ref 30.0–36.0)
MCV: 77.2 fL — ABNORMAL LOW (ref 80.0–100.0)
Platelets: 260 10*3/uL (ref 150–400)
RBC: 4.43 MIL/uL (ref 4.22–5.81)
RDW: 13 % (ref 11.5–15.5)
WBC: 9.3 10*3/uL (ref 4.0–10.5)
nRBC: 0 % (ref 0.0–0.2)

## 2023-05-28 LAB — GLUCOSE, CAPILLARY
Glucose-Capillary: 169 mg/dL — ABNORMAL HIGH (ref 70–99)
Glucose-Capillary: 187 mg/dL — ABNORMAL HIGH (ref 70–99)
Glucose-Capillary: 142 mg/dL — ABNORMAL HIGH (ref 70–99)
Glucose-Capillary: 152 mg/dL — ABNORMAL HIGH (ref 70–99)

## 2023-05-28 LAB — CULTURE, BLOOD (ROUTINE X 2)

## 2023-05-28 MED ORDER — MOMETASONE FURO-FORMOTEROL FUM 200-5 MCG/ACT IN AERO
2.0000 | INHALATION_SPRAY | Freq: Two times a day (BID) | RESPIRATORY_TRACT | Status: DC
  Administered 2023-05-28 – 2023-06-06 (×19): 2 via RESPIRATORY_TRACT
  Filled 2023-05-28: qty 8.8

## 2023-05-28 MED ORDER — HYDROXYZINE HCL 25 MG PO TABS
25.0000 mg | ORAL_TABLET | Freq: Three times a day (TID) | ORAL | Status: DC | PRN
  Administered 2023-05-28 – 2023-06-06 (×14): 25 mg via ORAL
  Filled 2023-05-28 (×14): qty 1

## 2023-05-28 MED ORDER — AMLODIPINE BESYLATE 10 MG PO TABS
10.0000 mg | ORAL_TABLET | Freq: Every day | ORAL | Status: DC
  Administered 2023-05-28 – 2023-06-11 (×14): 10 mg via ORAL
  Filled 2023-05-28 (×3): qty 1
  Filled 2023-05-28: qty 2
  Filled 2023-05-28 (×3): qty 1
  Filled 2023-05-28: qty 2
  Filled 2023-05-28 (×7): qty 1

## 2023-05-28 MED ORDER — ASPIRIN 81 MG PO TBEC
81.0000 mg | DELAYED_RELEASE_TABLET | Freq: Every day | ORAL | Status: DC
  Administered 2023-05-28 – 2023-06-11 (×14): 81 mg via ORAL
  Filled 2023-05-28 (×15): qty 1

## 2023-05-28 MED ORDER — CARVEDILOL 12.5 MG PO TABS
12.5000 mg | ORAL_TABLET | Freq: Two times a day (BID) | ORAL | Status: DC
  Administered 2023-05-28 – 2023-06-06 (×20): 12.5 mg via ORAL
  Filled 2023-05-28 (×21): qty 1

## 2023-05-28 MED ORDER — ATORVASTATIN CALCIUM 80 MG PO TABS
80.0000 mg | ORAL_TABLET | Freq: Every evening | ORAL | Status: DC
  Administered 2023-05-28 – 2023-06-11 (×14): 80 mg via ORAL
  Filled 2023-05-28 (×8): qty 1
  Filled 2023-05-28: qty 2
  Filled 2023-05-28 (×4): qty 1
  Filled 2023-05-28: qty 2

## 2023-05-28 MED ORDER — INSULIN GLARGINE-YFGN 100 UNIT/ML ~~LOC~~ SOLN
20.0000 [IU] | Freq: Every day | SUBCUTANEOUS | Status: DC
  Administered 2023-05-29 – 2023-05-31 (×2): 20 [IU] via SUBCUTANEOUS
  Filled 2023-05-28 (×4): qty 0.2

## 2023-05-28 MED ORDER — IPRATROPIUM-ALBUTEROL 0.5-2.5 (3) MG/3ML IN SOLN
3.0000 mL | Freq: Four times a day (QID) | RESPIRATORY_TRACT | Status: DC | PRN
  Administered 2023-06-06 – 2023-06-07 (×3): 3 mL via RESPIRATORY_TRACT
  Filled 2023-05-28 (×2): qty 3

## 2023-05-28 MED ORDER — AMITRIPTYLINE HCL 50 MG PO TABS
25.0000 mg | ORAL_TABLET | Freq: Every day | ORAL | Status: DC
  Administered 2023-05-28 – 2023-06-11 (×15): 25 mg via ORAL
  Filled 2023-05-28 (×17): qty 1

## 2023-05-28 MED ORDER — ALBUTEROL SULFATE (2.5 MG/3ML) 0.083% IN NEBU: 3.0000 mL | INHALATION_SOLUTION | Freq: Four times a day (QID) | RESPIRATORY_TRACT | Status: DC | PRN

## 2023-05-28 MED ORDER — CLOPIDOGREL BISULFATE 75 MG PO TABS
75.0000 mg | ORAL_TABLET | Freq: Every day | ORAL | Status: DC
  Administered 2023-05-28 – 2023-05-29 (×2): 75 mg via ORAL
  Filled 2023-05-28 (×2): qty 1

## 2023-05-28 NOTE — Plan of Care (Signed)

## 2023-05-28 NOTE — Progress Notes (Signed)
Pt received to 2W room 29 from ED.  Pt oriented to room and call bell.  See assessment, admit data base and vs's.  Rt foot cleans3ed w/ NS, covered with xeroform and wrapped in kerlix, secured with tape.  Pt denies pain and stated he recently had pain med in ED.

## 2023-05-28 NOTE — Progress Notes (Signed)
  Progress Note    05/28/2023 11:29 AM * No surgery found *  Subjective: Feeling better since arrival to hospital  Vitals:   05/28/23 0544 05/28/23 0757  BP: 127/79 113/76  Pulse: 75 79  Resp: 16   Temp: 98.3 F (36.8 C) (!) 97.5 F (36.4 C)  SpO2: 99% 98%    Physical Exam: Awake alert and oriented Right foot dressing clean dry intact Palpable right popliteal pulse  CBC    Component Value Date/Time   WBC 9.3 05/28/2023 0108   RBC 4.43 05/28/2023 0108   HGB 12.4 (L) 05/28/2023 0108   HCT 34.2 (L) 05/28/2023 0108   PLT 260 05/28/2023 0108   MCV 77.2 (L) 05/28/2023 0108   MCH 28.0 05/28/2023 0108   MCHC 36.3 (H) 05/28/2023 0108   RDW 13.0 05/28/2023 0108   LYMPHSABS 2.2 05/27/2023 1518   MONOABS 0.5 05/27/2023 1518   EOSABS 0.2 05/27/2023 1518   BASOSABS 0.0 05/27/2023 1518    BMET    Component Value Date/Time   NA 135 05/28/2023 0108   NA 131 (L) 12/14/2020 0849   K 3.5 05/28/2023 0108   CL 106 05/28/2023 0108   CO2 16 (L) 05/28/2023 0108   GLUCOSE 198 (H) 05/28/2023 0108   BUN 23 05/28/2023 0108   BUN 26 12/14/2020 0849   CREATININE 1.68 (H) 05/28/2023 0108   CALCIUM 9.8 05/28/2023 0108   GFRNONAA 45 (L) 05/28/2023 0108   GFRAA 53 (L) 12/14/2020 0849    INR    Component Value Date/Time   INR 1.1 01/27/2022 1856    No intake or output data in the 24 hours ending 05/28/23 1129   Assessment:  62 y.o. male is here with nonhealing right transmetatarsal amputation.  Patient does have a tibial rod.  Plan: Discussed with patient plan for right below the knee amputation and have offered him tomorrow but he is currently wishing for more time.  Surgery could be performed this weekend if necessary I will revisit with him tomorrow.   Dynasia Kercheval C. Randie Heinz, MD [facility] Office: [phone] Pager: [phone]  05/28/2023 11:29 AM

## 2023-05-28 NOTE — Progress Notes (Signed)
PROGRESS NOTE    Jose [name]  ZOX:[id number] DOB: February 19, 1961 DOA: 05/27/2023 PCP: [name]    Brief Narrative:  62 year old with history of coronary artery disease status post PCI, COPD, CKD stage IIIb, type 2 diabetes, hypertension hyperlipidemia, significant peripheral artery disease status post transmetatarsal amputation on 05/12/2023 presented with increasing pain and tissue loss at the TMA site.  Seen at vascular office and sent to admit for more proximal amputation.   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 Continue antibiotics with Rocephin and Flagyl.  Blood cultures negative so far.  Adequate pain medications.  Vascular surgery following.  Anticipate more proximal amputation to the healthy margin.  Chronic medical issues including CKD stage IIIb, renal function stable at baseline. Type 2 diabetes, well-controlled on insulin regimen. Essential hypertension, on amlodipine and Coreg. Coronary artery disease, stable on aspirin, Plavix, atorvastatin and Coreg. COPD, without exacerbation.  Stable. Hyperlipidemia, on atorvastatin.  Stable.   DVT prophylaxis: heparin injection 5,000 Units Start: 05/27/23 2300   Code Status: Full code Family Communication: None at the bedside Disposition Plan: Status is: Inpatient Remains inpatient appropriate because: Inpatient surgery planned     Consultants:  Vascular  Procedures:  None  Antimicrobials:  Anti-infectives (From admission, onward)    Start     Dose/Rate Route Frequency Ordered Stop   05/27/23 2030  cefTRIAXone (ROCEPHIN) 2 g in sodium chloride 0.9 % 100 mL IVPB       See Hyperspace for full Linked Orders Report.   2 g 200 mL/hr over 30 Minutes Intravenous Every 24 hours 05/27/23 2029 06/03/23 2029   05/27/23 2030  metroNIDAZOLE (FLAGYL) IVPB 500 mg       See Hyperspace for full Linked Orders Report.   500 mg 100 mL/hr over 60 Minutes Intravenous Every 12 hours 05/27/23 2029  06/03/23 2029         Subjective: Patient seen in the morning rounds.  He complains of having pain at the right foot.  He was very anxious about the further procedures.  Discussed with surgical team, likely surgery tomorrow.  Will allow to eat.  Objective: Vitals:   05/27/23 2317 05/27/23 2345 05/28/23 0544 05/28/23 0757  BP: (!) 149/77 (!) 154/92 127/79 113/76  Pulse: 86 93 75 79  Resp: 18 16 16    Temp: 97.7 F (36.5 C) 98.3 F (36.8 C) 98.3 F (36.8 C) (!) 97.5 F (36.4 C)  TempSrc: Oral   Axillary  SpO2: 100% 100% 99% 98%  Weight:      Height:       No intake or output data in the 24 hours ending 05/28/23 1054 Filed Weights   05/27/23 1505  Weight: 81.6 kg    Examination:  General: Looks fairly comfortable but anxious. Cardiovascular: S1-S2 normal.  Regular rate rhythm. Respiratory: Bilateral clear.  No added sounds. Gastrointestinal: Soft.  Nontender.  Bowel sound present. Ext: Right TMA site with necrotic tissue, swollen and edematous, picture in the chart. Neuro: Alert awake and oriented.  No neurological deficits.     Data Reviewed: I have personally reviewed following labs and imaging studies  CBC: Recent Labs  Lab 05/23/23 1133 05/27/23 1518 05/28/23 0108  WBC 8.4 8.0 9.3  NEUTROABS 5.5 5.1  --   HGB 12.7* 12.1* 12.4*  HCT 35.8* 33.5* 34.2*  MCV 76.7* 75.5* 77.2*  PLT 265 268 260   Basic Metabolic Panel: Recent Labs  Lab 05/23/23 1133 05/27/23 1518 05/28/23 0108  NA 134*  132* 135  K 4.1 3.4* 3.5  CL 97* 102 106  CO2 18* 15* 16*  GLUCOSE 226* 174* 198*  BUN 27* 25* 23  CREATININE 1.88* 1.73* 1.68*  CALCIUM 10.2 9.8 9.8   GFR: Estimated Creatinine Clearance: 47.3 mL/min (A) (by C-G formula based on SCr of 1.68 mg/dL (H)). Liver Function Tests: Recent Labs  Lab 05/23/23 1133 05/27/23 1518  AST 26 24  ALT 37 31  ALKPHOS 79 72  BILITOT 0.5 0.7  PROT 6.8 6.7  ALBUMIN 3.5 3.3*   No results for input(s): "LIPASE", "AMYLASE" in  the last 168 hours. No results for input(s): "AMMONIA" in the last 168 hours. Coagulation Profile: No results for input(s): "INR", "PROTIME" in the last 168 hours. Cardiac Enzymes: No results for input(s): "CKTOTAL", "CKMB", "CKMBINDEX", "TROPONINI" in the last 168 hours. BNP (last 3 results) No results for input(s): "PROBNP" in the last 8760 hours. HbA1C: No results for input(s): "HGBA1C" in the last 72 hours. CBG: Recent Labs  Lab 05/23/23 1402 05/28/23 0812  GLUCAP 167* 169*   Lipid Profile: No results for input(s): "CHOL", "HDL", "LDLCALC", "TRIG", "CHOLHDL", "LDLDIRECT" in the last 72 hours. Thyroid Function Tests: No results for input(s): "TSH", "T4TOTAL", "FREET4", "T3FREE", "THYROIDAB" in the last 72 hours. Anemia Panel: No results for input(s): "VITAMINB12", "FOLATE", "FERRITIN", "TIBC", "IRON", "RETICCTPCT" in the last 72 hours. Sepsis Labs: Recent Labs  Lab 05/27/23 1518  LATICACIDVEN 1.8    No results found for this or any previous visit (from the past 240 hour(s)).       Radiology Studies: DG Foot Complete Right  Result Date: 05/27/2023 CLINICAL DATA:  Recent toe amputation, pain.  Possible infection. EXAM: RIGHT FOOT COMPLETE - 3+ VIEW COMPARISON:  Foot radiographs 04/24/2023. FINDINGS: There has been interval transmetatarsal amputation of all 5 digits. The amputation margins are smooth. There is no cortical irregularity or osseous erosion/destruction. There is soft tissue swelling overlying the amputation site. Extensive vascular calcifications are noted. Postsurgical changes are partially imaged in the tibia. There is mild inferior calcaneal spurring and Achilles enthesopathy. IMPRESSION: Interval transmetatarsal amputation of all 5 digits with overlying soft tissue swelling but no other acute finding. No osseous erosion or destruction. Electronically Signed   By: Lesia Hausen M.D.   On: 05/27/2023 15:48        Scheduled Meds:  amitriptyline  25 mg Oral  QHS   amLODipine  10 mg Oral Daily   aspirin EC  81 mg Oral Daily   atorvastatin  80 mg Oral QPM   carvedilol  12.5 mg Oral BID WC   clopidogrel  75 mg Oral Q breakfast   heparin  5,000 Units Subcutaneous Q8H   insulin aspart  0-5 Units Subcutaneous QHS   insulin aspart  0-9 Units Subcutaneous TID WC   insulin glargine-yfgn  20 Units Subcutaneous QHS   mometasone-formoterol  2 puff Inhalation BID   oxyCODONE  10 mg Oral Q12H   senna  1 tablet Oral BID   Continuous Infusions:  cefTRIAXone (ROCEPHIN)  IV Stopped (05/27/23 2259)   And   metronidazole 500 mg (05/28/23 0805)     LOS: 1 day    Time spent: 35 minutes    Dorcas Carrow, MD [facility]

## 2023-05-29 ENCOUNTER — Ambulatory Visit (HOSPITAL_COMMUNITY): Payer: Medicare HMO

## 2023-05-29 DIAGNOSIS — T8189XA Other complications of procedures, not elsewhere classified, initial encounter: Secondary | ICD-10-CM | POA: Diagnosis not present

## 2023-05-29 LAB — GLUCOSE, CAPILLARY
Glucose-Capillary: 155 mg/dL — ABNORMAL HIGH (ref 70–99)
Glucose-Capillary: 67 mg/dL — ABNORMAL LOW (ref 70–99)
Glucose-Capillary: 199 mg/dL — ABNORMAL HIGH (ref 70–99)
Glucose-Capillary: 53 mg/dL — ABNORMAL LOW (ref 70–99)
Glucose-Capillary: 136 mg/dL — ABNORMAL HIGH (ref 70–99)
Glucose-Capillary: 246 mg/dL — ABNORMAL HIGH (ref 70–99)

## 2023-05-29 LAB — CULTURE, BLOOD (ROUTINE X 2)
Culture: NO GROWTH
Special Requests: ADEQUATE

## 2023-05-29 MED ORDER — SIMETHICONE 40 MG/0.6ML PO SUSP
40.0000 mg | Freq: Four times a day (QID) | ORAL | Status: DC | PRN
  Filled 2023-05-29: qty 0.6

## 2023-05-29 NOTE — TOC Initial Note (Addendum)
Transition of Care [facility]) - Initial/Assessment Note    Patient Details  Name: Jose [name] MRN: [id number] Date of Birth: 1961-07-26  Transition of Care [facility]) CM/SW Contact:    Janae Bridgeman, RN Phone Number: 05/29/2023, 2:24 PM  Clinical Narrative:                 CM met with the patient and family at the bedside to discuss TOC needs.  The patient admitted to the hospital with Right TMA infection and plans to have Right BKA tomorrow in the OR around 8:30 am. The patient lives at home alone and hopes to admit to Inpatient REhabilitation versus SNF after surgery, once medically stable since the patient lives alone and does not have a care provider.  The patient states that he did not notify the [place] upon admission to the hospital.  The patient gave permission for me to call.  I called April, CM with [place], [place] and left detailed message of patient's admission to the hospital.  DME at the home includes RW, 3:1 and tub bench.  The patient does not drive and usually has [facility] friend drive him or he coordinates rides to [place] appointments through [place] [facility] service.  PCP - [facility] [place] [facility]  CM and MSW will continue to follow the patient for CIR versus SNF placement post surgery - pending PT/OT evaluation after surgery.  Right foot at this time is wrapped with Kerlix dressing.  April, CM with [facility] called and patient's PCP is Dr. Louis Meckel, MSW with [facility] [phone] x [phone]  Expected Discharge Plan: IP Rehab Facility Barriers to Discharge: Continued Medical Work up   Patient Goals and CMS Choice Patient states their goals for this hospitalization and ongoing recovery are:: To get better and go to Rehab after surgery CMS Medicare.gov Compare Post Acute Care list provided to:: Patient Choice offered to / list presented to : Patient [place] ownership interest in [facility].provided to:: Patient    Expected Discharge Plan and Services   Discharge  Planning Services: CM Consult Post Acute Care Choice: IP Rehab Living arrangements for the past 2 months: Apartment                                      Prior Living Arrangements/Services Living arrangements for the past 2 months: Apartment Lives with:: Self Patient language and need for interpreter reviewed:: Yes Do you feel safe going back to the place where you live?: Yes (Patient plans to go to Rehabilitation first before returning home)      Need for Family Participation in Patient Care: Yes (Comment) Care giver support system in place?: Yes (comment) Current home services: DME (RW, 3:1, Shower seat) Criminal Activity/Legal Involvement Pertinent to Current Situation/Hospitalization: No - Comment as needed  Activities of Daily Living Home Assistive Devices/Equipment: Environmental consultant (specify type) ADL Screening (condition at time of admission) Patient's cognitive ability adequate to safely complete daily activities?: Yes Is the patient deaf or have difficulty hearing?: Yes Does the patient have difficulty seeing, even when wearing glasses/contacts?: No Does the patient have difficulty concentrating, remembering, or making decisions?: No Patient able to express need for assistance with ADLs?: Yes Does the patient have difficulty dressing or bathing?: No Independently performs ADLs?: Yes (appropriate for developmental age) Communication: Independent Dressing (OT): Needs assistance Grooming: Independent Feeding: Independent Bathing: Needs assistance Is this a change from baseline?:  Pre-admission baseline Toileting: Needs assistance Is this a change from baseline?: Pre-admission baseline In/Out Bed: Needs assistance Is this a change from baseline?: Pre-admission baseline Walks in Home: Independent with device (comment) Does the patient have difficulty walking or climbing stairs?: Yes Weakness of Legs: Right Weakness of Arms/Hands: None  Permission Sought/Granted Permission  sought to share information with : Case Manager, Family Supports, Photographer granted to share info w AGENCY: Rehabilitation facility        Emotional Assessment Appearance:: Appears stated age Attitude/Demeanor/Rapport: Gracious Affect (typically observed): Accepting Orientation: : Oriented to Self, Oriented to Place, Oriented to  Time, Oriented to Situation Alcohol / Substance Use: Not Applicable Psych Involvement: No (comment)  Admission diagnosis:  Nonhealing surgical wound, initial encounter [T81.89XA] Cellulitis of other specified site [L03.818] Patient Active Problem List   Diagnosis Date Noted   Nonhealing surgical wound, initial encounter 05/27/2023   Metabolic acidosis 05/11/2023   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) 05/09/2023   Pancreatic mass 05/09/2023   Constipation 05/09/2023   Glaucoma 05/09/2023   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) 04/24/2023   Gangrene of right foot (HCC) 04/24/2023   PTSD (post-traumatic stress disorder) 08/14/2022   Nightmares associated with chronic post-traumatic stress disorder 08/14/2022   Major depressive disorder, 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/11/2022   Suicidal ideation 08/11/2022   Abnormality of aortic valve 01/29/2022   Bradycardia 01/29/2022   Hypokalemia 01/29/2022   Unstable angina (HCC) 01/27/2022   Hypertensive urgency 02/11/2021   Chest pain 02/11/2021   NSTEMI (non-ST elevated myocardial infarction) (HCC) 08/22/2020   Acute coronary syndrome (HCC) 08/21/2020   Hypertensive emergency 08/21/2020   Depression    Elevated troponin    Nausea and vomiting    Suspected COVID-19 virus infection    Diarrhea 04/27/2019   Hypertensive retinopathy of both eyes 02/01/2019   Chest pain with moderate risk for cardiac etiology 08/30/2017   Chest pain with high risk for cardiac etiology 08/28/2017   Chronic kidney disease, stage 3b (HCC) 08/28/2017    COPD (chronic obstructive pulmonary disease) (HCC) 08/28/2017   CAD (coronary artery disease) 04/10/2016   Hyperlipidemia 04/10/2016   Benign prostatic hyperplasia 04/10/2016   Diabetes (HCC) 04/10/2016   Hyperglycemia 10/17/2015   Type 2 diabetes mellitus with complication, with long-term current use of insulin (HCC) 10/17/2015   Hyperkalemia 10/17/2015   Dyslipidemia associated with type 2 diabetes mellitus (HCC) 10/17/2015   Alcohol abuse    Cocaine abuse (HCC) 06/24/2015   Hyponatremia 05/12/2015   Hypertension associated with diabetes [facility])    PCP:  [facility]:   [facility] - [place], [place] - [address] [place] [address] Phone: [phone] Fax: [phone]  [facility] [phone] - [place],  - [address] AT [facility] [address] [place] [address] Phone: [phone] Fax: [phone]  [id number] - [place], [place] - [address] AT [facility] OF [address] [place] [address] Phone: [phone] Fax: [phone]     Social Determinants of Health (SDOH) Social History: SDOH Screenings   Food Insecurity: No Food Insecurity (05/28/2023)  Housing: Low Risk  (05/28/2023)  Transportation Needs: No Transportation Needs (05/28/2023)  Utilities: Not At Risk (05/28/2023)  Depression (PHQ2-9): High Risk (08/11/2022)  Tobacco Use: Medium Risk (05/28/2023)   SDOH Interventions:     Readmission Risk Interventions  05/29/2023    2:21 PM 04/29/2023    1:09 PM 01/29/2022    2:46 PM  Readmission Risk Prevention Plan  Transportation Screening Complete Complete Complete  PCP or Specialist Appt within 3-5 Days   Complete  HRI or Home Care Consult   Complete  Social Work Consult for Recovery Care Planning/Counseling   Complete  Palliative Care Screening   Not Applicable  Medication  Review Oceanographer) Complete Complete Complete  PCP or Specialist appointment within 3-5 days of discharge Complete    HRI or Home Care Consult Complete Complete   SW Recovery Care/Counseling Consult Complete Complete   Palliative Care Screening Not Applicable Not Applicable   Skilled Nursing Facility Complete Not Applicable

## 2023-05-29 NOTE — Progress Notes (Signed)
PROGRESS NOTE    Jose [name]  ZOX:[id number] DOB: 02-13-1961 DOA: 05/27/2023 PCP: [name]    Brief Narrative:  62 year old with history of coronary artery disease status post PCI, COPD, CKD stage IIIb, type 2 diabetes, hypertension hyperlipidemia, significant peripheral artery disease status post transmetatarsal amputation on 05/12/2023 presented with increasing pain and tissue loss at the TMA site.  Seen at vascular office and sent to admit for more proximal amputation.   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 Continue antibiotics with Rocephin and Flagyl.  Blood cultures negative so far.  Adequate pain medications.  Vascular surgery following.  Anticipate more proximal amputation to the healthy margin.  Scheduled for BKA tomorrow.  Chronic medical issues including CKD stage IIIb, renal function stable at baseline. Type 2 diabetes, well-controlled on insulin regimen. Essential hypertension, on amlodipine and Coreg. Coronary artery disease, stable on aspirin, Plavix, atorvastatin and Coreg. COPD, without exacerbation.  Stable. Hyperlipidemia, on atorvastatin.  Stable.   DVT prophylaxis: heparin injection 5,000 Units Start: 05/27/23 2300   Code Status: Full code Family Communication: None at the bedside Disposition Plan: Status is: Inpatient Remains inpatient appropriate because: Inpatient surgery planned     Consultants:  Vascular  Procedures:  None  Antimicrobials:  Anti-infectives (From admission, onward)    Start     Dose/Rate Route Frequency Ordered Stop   05/27/23 2030  cefTRIAXone (ROCEPHIN) 2 g in sodium chloride 0.9 % 100 mL IVPB       See Hyperspace for full Linked Orders Report.   2 g 200 mL/hr over 30 Minutes Intravenous Every 24 hours 05/27/23 2029 06/03/23 2029   05/27/23 2030  metroNIDAZOLE (FLAGYL) IVPB 500 mg       See Hyperspace for full Linked Orders Report.   500 mg 100 mL/hr over 60 Minutes Intravenous Every  12 hours 05/27/23 2029 06/03/23 2029         Subjective:  No new events.  It hurts.  He was indecisive but now is agreeable for right BKA. Objective: Vitals:   05/29/23 0004 05/29/23 0425 05/29/23 0741 05/29/23 0941  BP: (!) 148/98 134/70  136/71  Pulse: 84 76 74 72  Resp:  18 18 18   Temp:  98.2 F (36.8 C)  98.3 F (36.8 C)  TempSrc:      SpO2:  97% 98% 100%  Weight:      Height:        Intake/Output Summary (Last 24 hours) at 05/29/2023 0945 Last data filed at 05/29/2023 0545 Gross per 24 hour  Intake 1218.37 ml  Output 800 ml  Net 418.37 ml   Filed Weights   05/27/23 1505  Weight: 81.6 kg    Examination:  General: Looks fairly comfortable.  Appropriately anxious. Cardiovascular: S1-S2 normal.  Regular rate rhythm. Respiratory: Bilateral clear.  No added sounds. Gastrointestinal: Soft.  Nontender.  Bowel sound present. Ext: Right TMA site with necrotic tissue, swollen and edematous, picture in the chart. Neuro: Alert awake and oriented.  No neurological deficits.     Data Reviewed: I have personally reviewed following labs and imaging studies  CBC: Recent Labs  Lab 05/23/23 1133 05/27/23 1518 05/28/23 0108  WBC 8.4 8.0 9.3  NEUTROABS 5.5 5.1  --   HGB 12.7* 12.1* 12.4*  HCT 35.8* 33.5* 34.2*  MCV 76.7* 75.5* 77.2*  PLT 265 268 260    Basic Metabolic Panel: Recent Labs  Lab 05/23/23 1133 05/27/23 1518 05/28/23 0108  NA 134* 132*  135  K 4.1 3.4* 3.5  CL 97* 102 106  CO2 18* 15* 16*  GLUCOSE 226* 174* 198*  BUN 27* 25* 23  CREATININE 1.88* 1.73* 1.68*  CALCIUM 10.2 9.8 9.8    GFR: Estimated Creatinine Clearance: 47.3 mL/min (A) (by C-G formula based on SCr of 1.68 mg/dL (H)). Liver Function Tests: Recent Labs  Lab 05/23/23 1133 05/27/23 1518  AST 26 24  ALT 37 31  ALKPHOS 79 72  BILITOT 0.5 0.7  PROT 6.8 6.7  ALBUMIN 3.5 3.3*    No results for input(s): "LIPASE", "AMYLASE" in the last 168 hours. No results for input(s):  "AMMONIA" in the last 168 hours. Coagulation Profile: No results for input(s): "INR", "PROTIME" in the last 168 hours. Cardiac Enzymes: No results for input(s): "CKTOTAL", "CKMB", "CKMBINDEX", "TROPONINI" in the last 168 hours. BNP (last 3 results) No results for input(s): "PROBNP" in the last 8760 hours. HbA1C: No results for input(s): "HGBA1C" in the last 72 hours. CBG: Recent Labs  Lab 05/28/23 0812 05/28/23 1209 05/28/23 1755 05/28/23 2004 05/29/23 0809  GLUCAP 169* 187* 142* 152* 155*    Lipid Profile: No results for input(s): "CHOL", "HDL", "LDLCALC", "TRIG", "CHOLHDL", "LDLDIRECT" in the last 72 hours. Thyroid Function Tests: No results for input(s): "TSH", "T4TOTAL", "FREET4", "T3FREE", "THYROIDAB" in the last 72 hours. Anemia Panel: No results for input(s): "VITAMINB12", "FOLATE", "FERRITIN", "TIBC", "IRON", "RETICCTPCT" in the last 72 hours. Sepsis Labs: Recent Labs  Lab 05/27/23 1518  LATICACIDVEN 1.8     Recent Results (from the past 240 hour(s))  Blood Cultures x 2 sites     Status: None (Preliminary result)   Collection Time: 05/27/23 10:25 PM   Specimen: BLOOD  Result Value Ref Range Status   Specimen Description BLOOD SITE NOT SPECIFIED  Final   Special Requests   Final    BOTTLES DRAWN AEROBIC AND ANAEROBIC Blood Culture adequate volume   Culture   Final    NO GROWTH 2 DAYS Performed at [facility], [address] [place], [place] [address]    Report Status PENDING  Incomplete  Blood Cultures x 2 sites     Status: None (Preliminary result)   Collection Time: 05/28/23  1:08 AM   Specimen: BLOOD LEFT HAND  Result Value Ref Range Status   Specimen Description BLOOD LEFT HAND  Final   Special Requests   Final    BOTTLES DRAWN AEROBIC AND ANAEROBIC Blood Culture adequate volume   Culture   Final    NO GROWTH 1 DAY Performed at [facility], [address] [place], [place] [address]    Report Status PENDING  Incomplete          Radiology Studies: DG Foot Complete Right  Result Date: 05/27/2023 CLINICAL DATA:  Recent toe amputation, pain.  Possible infection. EXAM: RIGHT FOOT COMPLETE - 3+ VIEW COMPARISON:  Foot radiographs 04/24/2023. FINDINGS: There has been interval transmetatarsal amputation of all 5 digits. The amputation margins are smooth. There is no cortical irregularity or osseous erosion/destruction. There is soft tissue swelling overlying the amputation site. Extensive vascular calcifications are noted. Postsurgical changes are partially imaged in the tibia. There is mild inferior calcaneal spurring and Achilles enthesopathy. IMPRESSION: Interval transmetatarsal amputation of all 5 digits with overlying soft tissue swelling but no other acute finding. No osseous erosion or destruction. Electronically Signed   By: Lesia Hausen M.D.   On: 05/27/2023 15:48        Scheduled Meds:  amitriptyline  25 mg Oral QHS   amLODipine  10 mg Oral Daily   aspirin EC  81 mg Oral Daily   atorvastatin  80 mg Oral QPM   carvedilol  12.5 mg Oral BID WC   clopidogrel  75 mg Oral Q breakfast   heparin  5,000 Units Subcutaneous Q8H   insulin aspart  0-5 Units Subcutaneous QHS   insulin aspart  0-9 Units Subcutaneous TID WC   insulin glargine-yfgn  20 Units Subcutaneous Daily   mometasone-formoterol  2 puff Inhalation BID   oxyCODONE  10 mg Oral Q12H   senna  1 tablet Oral BID   Continuous Infusions:  cefTRIAXone (ROCEPHIN)  IV 2 g (05/28/23 2157)   And   metronidazole 500 mg (05/28/23 2017)     LOS: 2 days    Time spent: 35 minutes    Dorcas Carrow, MD [facility]

## 2023-05-29 NOTE — Progress Notes (Addendum)
  Progress Note    05/29/2023 7:50 AM * No surgery found *  Subjective:  no complaints. He is fine with getting surgery tomorrow    Vitals:   05/29/23 0425 05/29/23 0741  BP: 134/70   Pulse: 76 74  Resp: 18 18  Temp: 98.2 F (36.8 C)   SpO2: 97% 98%    Physical Exam: General:  no acute distress Cardiac:  regular Extremities:  right foot dressed and dry. Palpable right popliteal pulse   CBC    Component Value Date/Time   WBC 9.3 05/28/2023 0108   RBC 4.43 05/28/2023 0108   HGB 12.4 (L) 05/28/2023 0108   HCT 34.2 (L) 05/28/2023 0108   PLT 260 05/28/2023 0108   MCV 77.2 (L) 05/28/2023 0108   MCH 28.0 05/28/2023 0108   MCHC 36.3 (H) 05/28/2023 0108   RDW 13.0 05/28/2023 0108   LYMPHSABS 2.2 05/27/2023 1518   MONOABS 0.5 05/27/2023 1518   EOSABS 0.2 05/27/2023 1518   BASOSABS 0.0 05/27/2023 1518    BMET    Component Value Date/Time   NA 135 05/28/2023 0108   NA 131 (L) 12/14/2020 0849   K 3.5 05/28/2023 0108   CL 106 05/28/2023 0108   CO2 16 (L) 05/28/2023 0108   GLUCOSE 198 (H) 05/28/2023 0108   BUN 23 05/28/2023 0108   BUN 26 12/14/2020 0849   CREATININE 1.68 (H) 05/28/2023 0108   CALCIUM 9.8 05/28/2023 0108   GFRNONAA 45 (L) 05/28/2023 0108   GFRAA 53 (L) 12/14/2020 0849    INR    Component Value Date/Time   INR 1.1 01/27/2022 1856     Intake/Output Summary (Last 24 hours) at 05/29/2023 0750 Last data filed at 05/29/2023 0545 Gross per 24 hour  Intake 1218.37 ml  Output 800 ml  Net 418.37 ml      Assessment/Plan:  62 y.o. male with nonhealing right TMA   -Revisited surgical plans with patient. He does have nonhealing right TMA and he will require right BKA -He is agreeable for right BKA tomorrow -I will make NPO past midnight and place consent orders   Loel Dubonnet, PA-C [facility] [phone] 05/29/2023 7:50 AM  I have independently interviewed and examined patient and agree with PA assessment and plan above.   Plan for right BKA tomorrow.  We again discussed the risk benefits and alternatives as well as postprocedural plan he demonstrates good understanding.  Elya Diloreto C. Randie Heinz, MD [facility] [place] Office: [phone] Pager: [phone]

## 2023-05-30 ENCOUNTER — Inpatient Hospital Stay (HOSPITAL_COMMUNITY): Payer: No Typology Code available for payment source | Admitting: Anesthesiology

## 2023-05-30 ENCOUNTER — Encounter (HOSPITAL_COMMUNITY): Payer: Self-pay | Admitting: Internal Medicine

## 2023-05-30 ENCOUNTER — Encounter (HOSPITAL_COMMUNITY)
Admission: EM | Disposition: A | Discharge status: Rehabilitation Facility | Payer: Self-pay | Source: Home / Self Care | Attending: Internal Medicine

## 2023-05-30 ENCOUNTER — Other Ambulatory Visit: Payer: Self-pay

## 2023-05-30 DIAGNOSIS — I25110 Atherosclerotic heart disease of native coronary artery with unstable angina pectoris: Secondary | ICD-10-CM | POA: Diagnosis not present

## 2023-05-30 DIAGNOSIS — T8789 Other complications of amputation stump: Secondary | ICD-10-CM | POA: Diagnosis not present

## 2023-05-30 DIAGNOSIS — N1832 Chronic kidney disease, stage 3b: Secondary | ICD-10-CM

## 2023-05-30 DIAGNOSIS — I129 Hypertensive chronic kidney disease with stage 1 through stage 4 chronic kidney disease, or unspecified chronic kidney disease: Secondary | ICD-10-CM

## 2023-05-30 DIAGNOSIS — Z87891 Personal history of nicotine dependence: Secondary | ICD-10-CM

## 2023-05-30 DIAGNOSIS — T8189XA Other complications of procedures, not elsewhere classified, initial encounter: Secondary | ICD-10-CM | POA: Diagnosis not present

## 2023-05-30 DIAGNOSIS — T8781 Dehiscence of amputation stump: Secondary | ICD-10-CM

## 2023-05-30 HISTORY — PX: AMPUTATION: SHX166

## 2023-05-30 LAB — GLUCOSE, CAPILLARY
Glucose-Capillary: 155 mg/dL — ABNORMAL HIGH (ref 70–99)
Glucose-Capillary: 145 mg/dL — ABNORMAL HIGH (ref 70–99)
Glucose-Capillary: 238 mg/dL — ABNORMAL HIGH (ref 70–99)
Glucose-Capillary: 260 mg/dL — ABNORMAL HIGH (ref 70–99)
Glucose-Capillary: 312 mg/dL — ABNORMAL HIGH (ref 70–99)

## 2023-05-30 SURGERY — AMPUTATION BELOW KNEE
Anesthesia: Regional | Site: Knee | Laterality: Right

## 2023-05-30 MED ORDER — ONDANSETRON HCL 4 MG/2ML IJ SOLN
INTRAMUSCULAR | Status: DC | PRN
  Administered 2023-05-30: 4 mg via INTRAVENOUS

## 2023-05-30 MED ORDER — PROMETHAZINE HCL 25 MG/ML IJ SOLN: 6.2500 mg | INTRAMUSCULAR | Status: DC | PRN

## 2023-05-30 MED ORDER — FENTANYL CITRATE (PF) 250 MCG/5ML IJ SOLN
INTRAMUSCULAR | Status: AC
  Filled 2023-05-30: qty 5

## 2023-05-30 MED ORDER — LIDOCAINE HCL (CARDIAC) PF 100 MG/5ML IV SOSY
PREFILLED_SYRINGE | INTRAVENOUS | Status: DC | PRN
  Administered 2023-05-30: 40 mg via INTRATRACHEAL

## 2023-05-30 MED ORDER — PROPOFOL 10 MG/ML IV BOLUS
INTRAVENOUS | Status: AC
  Filled 2023-05-30: qty 20

## 2023-05-30 MED ORDER — CHLORHEXIDINE GLUCONATE 0.12 % MT SOLN
OROMUCOSAL | Status: AC
  Administered 2023-05-30: 15 mL
  Filled 2023-05-30: qty 15

## 2023-05-30 MED ORDER — ONDANSETRON HCL 4 MG/2ML IJ SOLN
INTRAMUSCULAR | Status: AC
  Filled 2023-05-30: qty 2

## 2023-05-30 MED ORDER — LACTATED RINGERS IV SOLN: INTRAVENOUS | Status: DC | PRN

## 2023-05-30 MED ORDER — CHLORHEXIDINE GLUCONATE CLOTH 2 % EX PADS
6.0000 | MEDICATED_PAD | Freq: Every day | CUTANEOUS | Status: DC
  Administered 2023-05-30 – 2023-06-12 (×8): 6 via TOPICAL

## 2023-05-30 MED ORDER — AMISULPRIDE (ANTIEMETIC) 5 MG/2ML IV SOLN: 10.0000 mg | Freq: Once | INTRAVENOUS | Status: DC | PRN

## 2023-05-30 MED ORDER — INSULIN ASPART 100 UNIT/ML IJ SOLN
0.0000 [IU] | INTRAMUSCULAR | Status: DC | PRN
  Administered 2023-05-30: 2 [IU] via SUBCUTANEOUS

## 2023-05-30 MED ORDER — BUPIVACAINE HCL (PF) 0.25 % IJ SOLN
INTRAMUSCULAR | Status: DC | PRN
  Administered 2023-05-30: 20 mL via PERINEURAL

## 2023-05-30 MED ORDER — FENTANYL CITRATE (PF) 100 MCG/2ML IJ SOLN: 25.0000 ug | INTRAMUSCULAR | Status: DC | PRN

## 2023-05-30 MED ORDER — OXYCODONE HCL 5 MG PO TABS: 5.0000 mg | ORAL_TABLET | Freq: Once | ORAL | Status: DC | PRN

## 2023-05-30 MED ORDER — MIDAZOLAM HCL 2 MG/2ML IJ SOLN
INTRAMUSCULAR | Status: AC
  Administered 2023-05-30: 2 mg
  Filled 2023-05-30: qty 2

## 2023-05-30 MED ORDER — PHENYLEPHRINE 80 MCG/ML (10ML) SYRINGE FOR IV PUSH (FOR BLOOD PRESSURE SUPPORT)
PREFILLED_SYRINGE | INTRAVENOUS | Status: AC
  Filled 2023-05-30: qty 10

## 2023-05-30 MED ORDER — PROPOFOL 10 MG/ML IV BOLUS
INTRAVENOUS | Status: DC | PRN
  Administered 2023-05-30: 150 mg via INTRAVENOUS

## 2023-05-30 MED ORDER — 0.9 % SODIUM CHLORIDE (POUR BTL) OPTIME
TOPICAL | Status: DC | PRN
  Administered 2023-05-30: 1000 mL

## 2023-05-30 MED ORDER — PHENYLEPHRINE 80 MCG/ML (10ML) SYRINGE FOR IV PUSH (FOR BLOOD PRESSURE SUPPORT)
PREFILLED_SYRINGE | INTRAVENOUS | Status: DC | PRN
  Administered 2023-05-30: 160 ug via INTRAVENOUS
  Administered 2023-05-30 (×3): 120 ug via INTRAVENOUS

## 2023-05-30 MED ORDER — ACETAMINOPHEN 10 MG/ML IV SOLN: 1000.0000 mg | Freq: Once | INTRAVENOUS | Status: DC | PRN

## 2023-05-30 MED ORDER — MIDAZOLAM HCL 2 MG/2ML IJ SOLN
INTRAMUSCULAR | Status: AC
  Filled 2023-05-30: qty 2

## 2023-05-30 MED ORDER — HEPARIN SODIUM (PORCINE) 5000 UNIT/ML IJ SOLN
5000.0000 [IU] | Freq: Three times a day (TID) | INTRAMUSCULAR | Status: DC
  Administered 2023-05-31 – 2023-06-12 (×37): 5000 [IU] via SUBCUTANEOUS
  Filled 2023-05-30 (×37): qty 1

## 2023-05-30 MED ORDER — PHENYLEPHRINE HCL-NACL 20-0.9 MG/250ML-% IV SOLN
INTRAVENOUS | Status: DC | PRN
  Administered 2023-05-30: 25 ug/min via INTRAVENOUS

## 2023-05-30 MED ORDER — OXYCODONE HCL 5 MG/5ML PO SOLN: 5.0000 mg | Freq: Once | ORAL | Status: DC | PRN

## 2023-05-30 MED ORDER — DEXAMETHASONE SODIUM PHOSPHATE 10 MG/ML IJ SOLN
INTRAMUSCULAR | Status: AC
  Filled 2023-05-30: qty 1

## 2023-05-30 MED ORDER — BUPIVACAINE HCL (PF) 0.5 % IJ SOLN
INTRAMUSCULAR | Status: DC | PRN
  Administered 2023-05-30: 30 mL via PERINEURAL

## 2023-05-30 MED ORDER — DEXAMETHASONE SODIUM PHOSPHATE 10 MG/ML IJ SOLN
INTRAMUSCULAR | Status: DC | PRN
  Administered 2023-05-30: 4 mg via INTRAVENOUS

## 2023-05-30 MED ORDER — FENTANYL CITRATE (PF) 100 MCG/2ML IJ SOLN
INTRAMUSCULAR | Status: AC
  Filled 2023-05-30: qty 2

## 2023-05-30 SURGICAL SUPPLY — 66 items
AMPUSHIELD SMALL PROSTHESIS SHRINKER IMPLANT
BAG COUNTER SPONGE SURGICOUNT (BAG) ×1 IMPLANT
BAG SPNG CNTER NS LX DISP (BAG) ×1
BANDAGE ESMARK 6X9 LF (GAUZE/BANDAGES/DRESSINGS) IMPLANT
BLADE LONG MED 31X9 (MISCELLANEOUS) IMPLANT
BLADE SAGITTAL (BLADE) ×1
BLADE SAW GIGLI 510 (BLADE) ×1 IMPLANT
BLADE SAW THK.89X75X18XSGTL (BLADE) IMPLANT
BNDG CMPR 5X6 CHSV STRCH STRL (GAUZE/BANDAGES/DRESSINGS) ×1
BNDG CMPR 5X62 HK CLSR LF (GAUZE/BANDAGES/DRESSINGS) ×1
BNDG CMPR 6 X 5 YARDS HK CLSR (GAUZE/BANDAGES/DRESSINGS) ×1
BNDG CMPR 6"X 5 YARDS HK CLSR (GAUZE/BANDAGES/DRESSINGS) ×1
BNDG CMPR 9X6 STRL LF SNTH (GAUZE/BANDAGES/DRESSINGS) ×1
BNDG COHESIVE 6X5 TAN ST LF (GAUZE/BANDAGES/DRESSINGS) ×1 IMPLANT
BNDG ELASTIC 4X5.8 VLCR STR LF (GAUZE/BANDAGES/DRESSINGS) IMPLANT
BNDG ELASTIC 6INX 5YD STR LF (GAUZE/BANDAGES/DRESSINGS) IMPLANT
BNDG ELASTIC 6X5.8 VLCR STR LF (GAUZE/BANDAGES/DRESSINGS) IMPLANT
BNDG ESMARK 6X9 LF (GAUZE/BANDAGES/DRESSINGS) ×1
BNDG GAUZE DERMACEA FLUFF 4 (GAUZE/BANDAGES/DRESSINGS) IMPLANT
BNDG GZE DERMACEA 4 6PLY (GAUZE/BANDAGES/DRESSINGS) ×1
BUR DISC 0.8X25 (BURR) IMPLANT
CANISTER SUCT 3000ML PPV (MISCELLANEOUS) ×1 IMPLANT
CLIP LIGATING EXTRA MED SLVR (CLIP) ×1 IMPLANT
CLIP LIGATING EXTRA SM BLUE (MISCELLANEOUS) ×1 IMPLANT
COVER SURGICAL LIGHT HANDLE (MISCELLANEOUS) ×1 IMPLANT
CUFF TOURN SGL QUICK 24 (TOURNIQUET CUFF) ×1
CUFF TOURN SGL QUICK 34 (TOURNIQUET CUFF)
CUFF TOURN SGL QUICK 42 (TOURNIQUET CUFF) IMPLANT
CUFF TRNQT CYL 24X4X16.5-23 (TOURNIQUET CUFF) IMPLANT
CUFF TRNQT CYL 34X4.125X (TOURNIQUET CUFF) IMPLANT
DRAIN CHANNEL 19F RND (DRAIN) IMPLANT
DRAPE HALF SHEET 40X57 (DRAPES) ×1 IMPLANT
DRAPE ORTHO SPLIT 77X108 STRL (DRAPES) ×2
DRAPE SURG ORHT 6 SPLT 77X108 (DRAPES) ×2 IMPLANT
DRSG ADAPTIC 3X8 NADH LF (GAUZE/BANDAGES/DRESSINGS) ×1 IMPLANT
ELECT REM PT RETURN 9FT ADLT (ELECTROSURGICAL) ×1
ELECTRODE REM PT RTRN 9FT ADLT (ELECTROSURGICAL) ×1 IMPLANT
EVACUATOR SILICONE 100CC (DRAIN) IMPLANT
GAUZE SPONGE 4X4 12PLY STRL (GAUZE/BANDAGES/DRESSINGS) ×1 IMPLANT
GLOVE BIO SURGEON STRL SZ7 (GLOVE) IMPLANT
GLOVE BIO SURGEON STRL SZ7.5 (GLOVE) ×1 IMPLANT
GOWN STRL REUS W/ TWL LRG LVL3 (GOWN DISPOSABLE) ×2 IMPLANT
GOWN STRL REUS W/ TWL XL LVL3 (GOWN DISPOSABLE) ×1 IMPLANT
GOWN STRL REUS W/TWL LRG LVL3 (GOWN DISPOSABLE) ×2
GOWN STRL REUS W/TWL XL LVL3 (GOWN DISPOSABLE) ×1
KIT BASIN OR (CUSTOM PROCEDURE TRAY) ×1 IMPLANT
KIT TURNOVER KIT B (KITS) ×1 IMPLANT
NS IRRIG 1000ML POUR BTL (IV SOLUTION) ×1 IMPLANT
PACK GENERAL/GYN (CUSTOM PROCEDURE TRAY) ×1 IMPLANT
PAD ARMBOARD 7.5X6 YLW CONV (MISCELLANEOUS) ×2 IMPLANT
POWDER SURGICEL 3.0 GRAM (HEMOSTASIS) IMPLANT
RASP HELIOCORDIAL MED (MISCELLANEOUS) IMPLANT
STAPLER VISISTAT 35W (STAPLE) ×1 IMPLANT
STOCKINETTE IMPERVIOUS LG (DRAPES) ×1 IMPLANT
SUT BONE WAX W31G (SUTURE) IMPLANT
SUT ETHILON 3 0 PS 1 (SUTURE) IMPLANT
SUT SILK 0 TIES 10X30 (SUTURE) ×1 IMPLANT
SUT SILK 2 0 (SUTURE) ×1
SUT SILK 2 0 SH CR/8 (SUTURE) ×1 IMPLANT
SUT SILK 2-0 18XBRD TIE 12 (SUTURE) ×1 IMPLANT
SUT SILK 3 0 (SUTURE)
SUT SILK 3-0 18XBRD TIE 12 (SUTURE) IMPLANT
SUT VIC AB 2-0 CT1 18 (SUTURE) ×2 IMPLANT
TOWEL GREEN STERILE (TOWEL DISPOSABLE) ×2 IMPLANT
UNDERPAD 30X36 HEAVY ABSORB (UNDERPADS AND DIAPERS) ×1 IMPLANT
WATER STERILE IRR 1000ML POUR (IV SOLUTION) ×1 IMPLANT

## 2023-05-30 NOTE — Progress Notes (Signed)
  Progress Note    05/30/2023 7:22 AM Day of Surgery  Subjective: No overnight issues has been n.p.o. since midnight.  Vitals:   05/30/23 0514 05/30/23 0650  BP: (!) 150/75 137/85  Pulse: 72 73  Resp: 18 18  Temp: 98 F (36.7 C) 98 F (36.7 C)  SpO2: 98%     Physical Exam: Awake alert oriented Right leg appears well-perfused right transmetatarsal amputation is soupy on the lateral aspect and painful to even light touch  CBC    Component Value Date/Time   WBC 9.3 05/28/2023 0108   RBC 4.43 05/28/2023 0108   HGB 12.4 (L) 05/28/2023 0108   HCT 34.2 (L) 05/28/2023 0108   PLT 260 05/28/2023 0108   MCV 77.2 (L) 05/28/2023 0108   MCH 28.0 05/28/2023 0108   MCHC 36.3 (H) 05/28/2023 0108   RDW 13.0 05/28/2023 0108   LYMPHSABS 2.2 05/27/2023 1518   MONOABS 0.5 05/27/2023 1518   EOSABS 0.2 05/27/2023 1518   BASOSABS 0.0 05/27/2023 1518    BMET    Component Value Date/Time   NA 135 05/28/2023 0108   NA 131 (L) 12/14/2020 0849   K 3.5 05/28/2023 0108   CL 106 05/28/2023 0108   CO2 16 (L) 05/28/2023 0108   GLUCOSE 198 (H) 05/28/2023 0108   BUN 23 05/28/2023 0108   BUN 26 12/14/2020 0849   CREATININE 1.68 (H) 05/28/2023 0108   CALCIUM 9.8 05/28/2023 0108   GFRNONAA 45 (L) 05/28/2023 0108   GFRAA 53 (L) 12/14/2020 0849    INR    Component Value Date/Time   INR 1.1 01/27/2022 1856     Intake/Output Summary (Last 24 hours) at 05/30/2023 0722 Last data filed at 05/30/2023 0455 Gross per 24 hour  Intake 300 ml  Output 600 ml  Net -300 ml     Assessment:  62 y.o. male is here with nonhealing right transmetatarsal amputation.  Plan: OR today for right below-knee amputation.  Discussed the risk benefits and alternatives as well as expected healing time as he demonstrates good understanding.  I will contact his niece after surgery for updates.   Christne Platts C. Randie Heinz, MD [facility] [place] Office: [phone] Pager:  [phone]  05/30/2023 7:22 AM

## 2023-05-30 NOTE — Anesthesia Preprocedure Evaluation (Addendum)
Anesthesia Evaluation  Patient identified by MRN, date of birth, ID band Patient awake    Reviewed: Allergy & Precautions, NPO status , Patient's Chart, lab work & pertinent test results  Airway Mallampati: II  TM Distance: >3 FB Neck ROM: Full    Dental no notable dental hx.    Pulmonary COPD,  COPD inhaler, former smoker   Pulmonary exam normal        Cardiovascular hypertension, Pt. on medications and Pt. on home beta blockers + CAD, + Past MI, + Cardiac Stents and + Peripheral Vascular Disease  Normal cardiovascular exam     Neuro/Psych  PSYCHIATRIC DISORDERS Anxiety Depression    negative neurological ROS     GI/Hepatic negative GI ROS,,,(+)     substance abuse    Endo/Other  diabetes, Insulin Dependent    Renal/GU Renal disease     Musculoskeletal negative musculoskeletal ROS (+)    Abdominal   Peds  Hematology  (+) Blood dyscrasia (Plavix), anemia   Anesthesia Other Findings CELLULITIS  Reproductive/Obstetrics                             Anesthesia Physical Anesthesia Plan  ASA: 3  Anesthesia Plan: General and Regional   Post-op Pain Management:    Induction: Intravenous  PONV Risk Score and Plan: 2 and Ondansetron, Dexamethasone, Midazolam and Treatment may vary due to age or medical condition  Airway Management Planned: LMA  Additional Equipment:   Intra-op Plan:   Post-operative Plan: Extubation in OR  Informed Consent: I have reviewed the patients History and Physical, chart, labs and discussed the procedure including the risks, benefits and alternatives for the proposed anesthesia with the patient or authorized representative who has indicated his/her understanding and acceptance.     Dental advisory given  Plan Discussed with: CRNA and Surgeon  Anesthesia Plan Comments:        Anesthesia Quick Evaluation

## 2023-05-30 NOTE — Op Note (Signed)
    Patient name: Jose [name] MRN: [id number] DOB: 1961-02-14 Sex: male  05/30/2023 Pre-operative Diagnosis: Nonhealing right transmetatarsal amputation Post-operative diagnosis:  Same Surgeon:  Apolinar Junes C. Randie Heinz, MD Assistant: Loel Dubonnet, PA Procedure Performed: Right below-knee amputation  Indications: 62 year old male underwent right lower extremity revascularization followed by transmetatarsal amputation which has now failed to heal.  He was evaluated.  Septic was admitted to the hospital and is now hemodynamically stable feeling much better with a nonhealing right transmetatarsal amputation.  We have discussed the options he is elected for right below-knee amputation.  An experienced assistant was necessary to facilitate exposure of the important underlying bony and neurovascular structures as well as for dividing the tibia metal rod in place.  Findings: Underlying structures were viable there was adequate capillary bleeding in the wound bed.  All tibial vessels were heavily calcified and.  There was a metal rod in the tibia which was divided using a rasp the surgical bed.  At completion all tissue reapproximated without tension.   Procedure:  The patient was identified in the holding area and taken to the operating room where is placed supine operative when general anesthesia was induced.  He was sterilely prepped draped in the right lower extremity in usual fashion, antibiotics were administered and a timeout was called.  We began working area two thirds and 1/3 type incision approximate 12 cm below the tibial tuberosity.  Tourniquet was placed above the knee the leg was exsanguinated and tourniquet was inflated to 250 mmHg.  The incision was then created with a 10 blade we dissected down through the subcutaneous tissue and muscle with cautery and exposed the tibia and elevated the periosteum several centimeters off of the tibia.  The bone around the tibia was then divided with salt and I  did have to wedge piece of tibia out for approximately 1 cm to expose the underlying rod.  This was then divided using a rasp bit.  We then divided the fibula with solving and created a posterior flap with amputation knife.  All vessels were then clamped and the tourniquet was allowed down the vessels were oversewn with 2-0 Vicryl suture in figure-of-eight configuration.  The wound was then irrigated and hemostasis was obtained.  Fascial layers were reapproximated using interrupted 2-0 Vicryl suture and the skin was closed with staples and a sterile dressing was placed as well as AV shield.  Patient was then awakened from anesthesia having tolerated the procedure without immediate complication.  All counts were correct at completion.  EBL: 200 cc    Batoul Limes C. Randie Heinz, MD [facility] Office: [phone] Pager: [phone]

## 2023-05-30 NOTE — Anesthesia Postprocedure Evaluation (Signed)
Anesthesia Post Note  Patient: Jose [name]  Procedure(s) Performed: AMPUTATION BELOW KNEE (Right: Knee)     Patient location during evaluation: PACU Anesthesia Type: Regional and General Level of consciousness: awake Pain management: pain level controlled Vital Signs Assessment: post-procedure vital signs reviewed and stable Respiratory status: spontaneous breathing, nonlabored ventilation and respiratory function stable Cardiovascular status: blood pressure returned to baseline and stable Postop Assessment: no apparent nausea or vomiting Anesthetic complications: no   No notable events documented.  Last Vitals:  Vitals:   05/30/23 0930 05/30/23 0957  BP: (!) 164/84 131/73  Pulse: 70 70  Resp: 12 18  Temp: 36.4 C 36.8 C  SpO2: 97% 100%    Last Pain:  Vitals:   05/30/23 0957  TempSrc: Oral  PainSc:                  Catheryn Bacon Kristan Brummitt

## 2023-05-30 NOTE — Anesthesia Procedure Notes (Signed)
Anesthesia Regional Block: Adductor canal block   Pre-Anesthetic Checklist: , timeout performed,  Correct Patient, Correct Site, Correct Laterality,  Correct Procedure,, site marked,  Risks and benefits discussed,  Surgical consent,  Pre-op evaluation,  At surgeon's request and post-op pain management  Laterality: Right  Prep: chloraprep       Needles:  Injection technique: Single-shot  Needle Type: Echogenic Stimulator Needle     Needle Length: 10cm  Needle Gauge: 20     Additional Needles:   Procedures:,,,, ultrasound used (permanent image in chart),,    Narrative:  Start time: 05/30/2023 7:25 AM End time: 05/30/2023 7:35 AM Injection made incrementally with aspirations every 5 mL.  Performed by: Personally  Anesthesiologist: Leonides Grills, MD  Additional Notes: Functioning IV was confirmed and monitors were applied. A time-out was performed. Hand hygiene and sterile gloves were used. The thigh was placed in a frog-leg position and prepped in a sterile fashion. A 20ga Bbraun echogenic stimulator needle was placed using ultrasound guidance.  Negative aspiration and negative test dose prior to incremental administration of local anesthetic. The patient tolerated the procedure well.

## 2023-05-30 NOTE — Progress Notes (Signed)
Patient in Surgery.

## 2023-05-30 NOTE — Transfer of Care (Signed)
Immediate Anesthesia Transfer of Care Note  Patient: Jose [name]  Procedure(s) Performed: AMPUTATION BELOW KNEE (Right: Knee)  Patient Location: PACU  Anesthesia Type:GA combined with regional for post-op pain  Level of Consciousness: awake, alert , and oriented  Airway & Oxygen Therapy: Patient Spontanous Breathing and Patient connected to nasal cannula oxygen  Post-op Assessment: Report given to RN and Post -op Vital signs reviewed and stable  Post vital signs: Reviewed and stable  Last Vitals:  Vitals Value Taken Time  BP 148/87   Temp    Pulse 82 05/30/23 0906  Resp 9   SpO2 99 % 05/30/23 0906  Vitals shown include unvalidated device data.  Last Pain:  Vitals:   05/30/23 0650  TempSrc: Oral  PainSc:          Complications: No notable events documented.

## 2023-05-30 NOTE — Anesthesia Procedure Notes (Signed)
Procedure Name: LMA Insertion Date/Time: 05/30/2023 7:48 AM  Performed by: Randon Goldsmith, CRNAPre-anesthesia Checklist: Patient identified, Emergency Drugs available, Suction available and Patient being monitored Patient Re-evaluated:Patient Re-evaluated prior to induction Oxygen Delivery Method: Circle system utilized Preoxygenation: Pre-oxygenation with 100% oxygen Induction Type: IV induction Ventilation: Mask ventilation without difficulty LMA: LMA inserted LMA Size: 4.0 Number of attempts: 1 Airway Equipment and Method: Bite block Placement Confirmation: positive ETCO2 and breath sounds checked- equal and bilateral Tube secured with: Tape Dental Injury: Teeth and Oropharynx as per pre-operative assessment

## 2023-05-30 NOTE — Progress Notes (Signed)
PROGRESS NOTE    Ramar Grass  ZOX:[id number] DOB: 1961/06/23 DOA: 05/27/2023 PCP: [name]    Brief Narrative:  62 year old with history of coronary artery disease status post PCI, COPD, CKD stage IIIb, type 2 diabetes, hypertension hyperlipidemia, significant peripheral artery disease status post transmetatarsal amputation on 05/12/2023 presented with increasing pain and tissue loss at the TMA site.  Seen at vascular office and sent to admit for more proximal amputation.   Assessment & Plan:   PAD and dry gangrene of TMA site: Treated with antibiotics, underwent below-knee amputation today.  Blood cultures negative. Will continue antibiotics for next 24 hours, patient has achieved amputation to clean margin. Postop pain management, mobility, wound care as per surgery.  Patient will need rehab.  Chronic medical issues including CKD stage IIIb, renal function stable at baseline. Type 2 diabetes, well-controlled on insulin regimen. Essential hypertension, on amlodipine and Coreg. Coronary artery disease, stable on aspirin, Plavix, atorvastatin and Coreg. COPD, without exacerbation.  Stable. Hyperlipidemia, on atorvastatin.  Stable.   DVT prophylaxis: heparin injection 5,000 Units Start: 05/31/23 0600   Code Status: Full code Family Communication:  Disposition Plan: Status is: Inpatient Remains inpatient appropriate because: Inpatient surgery planned     Consultants:  Vascular  Procedures:  Right below-knee amputation 7/6  Antimicrobials:  Anti-infectives (From admission, onward)    Start     Dose/Rate Route Frequency Ordered Stop   05/27/23 2030  cefTRIAXone (ROCEPHIN) 2 g in sodium chloride 0.9 % 100 mL IVPB       See Hyperspace for full Linked Orders Report.   2 g 200 mL/hr over 30 Minutes Intravenous Every 24 hours 05/27/23 2029 06/03/23 2029   05/27/23 2030  metroNIDAZOLE (FLAGYL) IVPB 500 mg       See Hyperspace for full Linked Orders Report.    500 mg 100 mL/hr over 60 Minutes Intravenous Every 12 hours 05/27/23 2029 06/03/23 2029         Subjective:  Came back from surgery. Feels little hot but no other problems  Objective: Vitals:   05/30/23 0908 05/30/23 0915 05/30/23 0930 05/30/23 0957  BP: (!) 148/87 (!) 153/107 (!) 164/84 131/73  Pulse: 74 72 70 70  Resp: 16 12 12 18   Temp: 97.8 F (36.6 C)  97.6 F (36.4 C) 98.3 F (36.8 C)  TempSrc:    Oral  SpO2: 99% 100% 97% 100%  Weight:      Height:        Intake/Output Summary (Last 24 hours) at 05/30/2023 1033 Last data filed at 05/30/2023 0854 Gross per 24 hour  Intake 1200 ml  Output 800 ml  Net 400 ml    Filed Weights   05/27/23 1505 05/30/23 0500  Weight: 81.6 kg 69.6 kg    Examination:  Physical Exam Eyes:     Pupils: Pupils are equal, round, and reactive to light.  Cardiovascular:     Rate and Rhythm: Normal rate and regular rhythm.     Pulses: Normal pulses.  Pulmonary:     Effort: Pulmonary effort is normal.     Breath sounds: Normal breath sounds.  Abdominal:     General: Abdomen is flat. Bowel sounds are normal.     Palpations: Abdomen is soft.  Musculoskeletal:     Cervical back: Normal range of motion.     Comments: Right leg immediate post op dressing and knee immobilizer   Neurological:     Mental Status: He is alert.  Data Reviewed: I have personally reviewed following labs and imaging studies  CBC: Recent Labs  Lab 05/23/23 1133 05/27/23 1518 05/28/23 0108  WBC 8.4 8.0 9.3  NEUTROABS 5.5 5.1  --   HGB 12.7* 12.1* 12.4*  HCT 35.8* 33.5* 34.2*  MCV 76.7* 75.5* 77.2*  PLT 265 268 260    Basic Metabolic Panel: Recent Labs  Lab 05/23/23 1133 05/27/23 1518 05/28/23 0108  NA 134* 132* 135  K 4.1 3.4* 3.5  CL 97* 102 106  CO2 18* 15* 16*  GLUCOSE 226* 174* 198*  BUN 27* 25* 23  CREATININE 1.88* 1.73* 1.68*  CALCIUM 10.2 9.8 9.8    GFR: Estimated Creatinine Clearance: 43.7 mL/min (A) (by C-G formula  based on SCr of 1.68 mg/dL (H)). Liver Function Tests: Recent Labs  Lab 05/23/23 1133 05/27/23 1518  AST 26 24  ALT 37 31  ALKPHOS 79 72  BILITOT 0.5 0.7  PROT 6.8 6.7  ALBUMIN 3.5 3.3*    No results for input(s): "LIPASE", "AMYLASE" in the last 168 hours. No results for input(s): "AMMONIA" in the last 168 hours. Coagulation Profile: No results for input(s): "INR", "PROTIME" in the last 168 hours. Cardiac Enzymes: No results for input(s): "CKTOTAL", "CKMB", "CKMBINDEX", "TROPONINI" in the last 168 hours. BNP (last 3 results) No results for input(s): "PROBNP" in the last 8760 hours. HbA1C: No results for input(s): "HGBA1C" in the last 72 hours. CBG: Recent Labs  Lab 05/29/23 1600 05/29/23 1700 05/29/23 2002 05/30/23 0502 05/30/23 0908  GLUCAP 53* 136* 246* 155* 145*    Lipid Profile: No results for input(s): "CHOL", "HDL", "LDLCALC", "TRIG", "CHOLHDL", "LDLDIRECT" in the last 72 hours. Thyroid Function Tests: No results for input(s): "TSH", "T4TOTAL", "FREET4", "T3FREE", "THYROIDAB" in the last 72 hours. Anemia Panel: No results for input(s): "VITAMINB12", "FOLATE", "FERRITIN", "TIBC", "IRON", "RETICCTPCT" in the last 72 hours. Sepsis Labs: Recent Labs  Lab 05/27/23 1518  LATICACIDVEN 1.8     Recent Results (from the past 240 hour(s))  Blood Cultures x 2 sites     Status: None (Preliminary result)   Collection Time: 05/27/23 10:25 PM   Specimen: BLOOD  Result Value Ref Range Status   Specimen Description BLOOD SITE NOT SPECIFIED  Final   Special Requests   Final    BOTTLES DRAWN AEROBIC AND ANAEROBIC Blood Culture adequate volume   Culture   Final    NO GROWTH 3 DAYS Performed at [facility], [address], [place], [place] [address]    Report Status PENDING  Incomplete  Blood Cultures x 2 sites     Status: None (Preliminary result)   Collection Time: 05/28/23  1:08 AM   Specimen: BLOOD LEFT HAND  Result Value Ref Range Status   Specimen  Description BLOOD LEFT HAND  Final   Special Requests   Final    BOTTLES DRAWN AEROBIC AND ANAEROBIC Blood Culture adequate volume   Culture   Final    NO GROWTH 2 DAYS Performed at [facility], [address], [place], [place] [address]    Report Status PENDING  Incomplete         Radiology Studies: No results found.      Scheduled Meds:  amitriptyline  25 mg Oral QHS   amLODipine  10 mg Oral Daily   aspirin EC  81 mg Oral Daily   atorvastatin  80 mg Oral QPM   carvedilol  12.5 mg Oral BID WC   Chlorhexidine Gluconate Cloth  6 each Topical Q0600   fentaNYL       [START ON 05/31/2023] heparin  5,000 Units Subcutaneous Q8H   insulin aspart  0-5 Units Subcutaneous QHS   insulin aspart  0-9 Units Subcutaneous TID WC   insulin glargine-yfgn  20 Units Subcutaneous Daily   mometasone-formoterol  2 puff Inhalation BID   oxyCODONE  10 mg Oral Q12H   senna  1 tablet Oral BID   Continuous Infusions:  cefTRIAXone (ROCEPHIN)  IV 2 g (05/29/23 2159)   And   metronidazole 500 mg (05/29/23 2024)     LOS: 3 days    Time spent: 35 minutes    Dorcas Carrow, MD [facility]

## 2023-05-30 NOTE — Anesthesia Procedure Notes (Signed)
Anesthesia Regional Block: Popliteal block   Pre-Anesthetic Checklist: , timeout performed,  Correct Patient, Correct Site, Correct Laterality,  Correct Procedure, Correct Position, site marked,  Risks and benefits discussed,  Surgical consent,  Pre-op evaluation,  At surgeon's request and post-op pain management  Laterality: Right  Prep: chloraprep       Needles:  Injection technique: Single-shot  Needle Type: Echogenic Stimulator Needle     Needle Length: 10cm  Needle Gauge: 20     Additional Needles:   Procedures:,,,, ultrasound used (permanent image in chart),,    Narrative:  Start time: 05/30/2023 7:20 AM End time: 05/30/2023 7:30 AM Injection made incrementally with aspirations every 5 mL.  Performed by: Personally  Anesthesiologist: Leonides Grills, MD  Additional Notes: Functioning IV was confirmed and monitors were applied.  A timeout was performed. Sterile prep, hand hygiene and sterile gloves were used. A 20ga Bbraun echogenic stimulator needle was used. Negative aspiration and negative test dose prior to incremental administration of local anesthetic. The patient tolerated the procedure well.  Ultrasound guidance: relevent anatomy identified, needle position confirmed, local anesthetic spread visualized around nerve(s), vascular puncture avoided.  Image printed for medical record.

## 2023-05-30 NOTE — Plan of Care (Signed)

## 2023-05-31 DIAGNOSIS — T8189XA Other complications of procedures, not elsewhere classified, initial encounter: Secondary | ICD-10-CM | POA: Diagnosis not present

## 2023-05-31 LAB — CBC WITH DIFFERENTIAL/PLATELET
Abs Immature Granulocytes: 0.02 10*3/uL (ref 0.00–0.07)
Basophils Absolute: 0 10*3/uL (ref 0.0–0.1)
Basophils Relative: 0 %
Eosinophils Absolute: 0 10*3/uL (ref 0.0–0.5)
Eosinophils Relative: 0 %
HCT: 27.1 % — ABNORMAL LOW (ref 39.0–52.0)
Hemoglobin: 9.8 g/dL — ABNORMAL LOW (ref 13.0–17.0)
Immature Granulocytes: 0 %
Lymphocytes Relative: 20 %
Lymphs Abs: 1.8 10*3/uL (ref 0.7–4.0)
MCH: 27.3 pg (ref 26.0–34.0)
MCHC: 36.2 g/dL — ABNORMAL HIGH (ref 30.0–36.0)
MCV: 75.5 fL — ABNORMAL LOW (ref 80.0–100.0)
Monocytes Absolute: 0.6 10*3/uL (ref 0.1–1.0)
Monocytes Relative: 6 %
Neutro Abs: 6.7 10*3/uL (ref 1.7–7.7)
Neutrophils Relative %: 74 %
Platelets: 240 10*3/uL (ref 150–400)
RBC: 3.59 MIL/uL — ABNORMAL LOW (ref 4.22–5.81)
RDW: 13.2 % (ref 11.5–15.5)
WBC: 9.1 10*3/uL (ref 4.0–10.5)
nRBC: 0 % (ref 0.0–0.2)

## 2023-05-31 LAB — BASIC METABOLIC PANEL
Anion gap: 9 (ref 5–15)
BUN: 26 mg/dL — ABNORMAL HIGH (ref 8–23)
CO2: 18 mmol/L — ABNORMAL LOW (ref 22–32)
Calcium: 9 mg/dL (ref 8.9–10.3)
Chloride: 105 mmol/L (ref 98–111)
Creatinine, Ser: 1.4 mg/dL — ABNORMAL HIGH (ref 0.61–1.24)
GFR, Estimated: 56 mL/min — ABNORMAL LOW (ref >60)
Glucose, Bld: 212 mg/dL — ABNORMAL HIGH (ref 70–99)
Potassium: 4 mmol/L (ref 3.5–5.1)
Sodium: 132 mmol/L — ABNORMAL LOW (ref 135–145)

## 2023-05-31 LAB — GLUCOSE, CAPILLARY
Glucose-Capillary: 218 mg/dL — ABNORMAL HIGH (ref 70–99)
Glucose-Capillary: 224 mg/dL — ABNORMAL HIGH (ref 70–99)
Glucose-Capillary: 205 mg/dL — ABNORMAL HIGH (ref 70–99)
Glucose-Capillary: 241 mg/dL — ABNORMAL HIGH (ref 70–99)
Glucose-Capillary: 238 mg/dL — ABNORMAL HIGH (ref 70–99)

## 2023-05-31 LAB — MAGNESIUM: Magnesium: 1.5 mg/dL — ABNORMAL LOW (ref 1.7–2.4)

## 2023-05-31 LAB — CULTURE, BLOOD (ROUTINE X 2): Special Requests: ADEQUATE

## 2023-05-31 MED ORDER — MAGNESIUM OXIDE -MG SUPPLEMENT 400 (240 MG) MG PO TABS
400.0000 mg | ORAL_TABLET | Freq: Two times a day (BID) | ORAL | Status: DC
  Administered 2023-05-31 – 2023-06-11 (×23): 400 mg via ORAL
  Filled 2023-05-31 (×24): qty 1

## 2023-05-31 MED ORDER — MAGNESIUM SULFATE 2 GM/50ML IV SOLN
2.0000 g | Freq: Once | INTRAVENOUS | Status: AC
  Administered 2023-05-31: 2 g via INTRAVENOUS
  Filled 2023-05-31: qty 50

## 2023-05-31 MED ORDER — HYDROMORPHONE HCL 1 MG/ML IJ SOLN
1.0000 mg | INTRAMUSCULAR | Status: DC | PRN
  Administered 2023-05-31 – 2023-06-02 (×20): 1 mg via INTRAVENOUS
  Filled 2023-05-31 (×20): qty 1

## 2023-05-31 NOTE — Progress Notes (Signed)
Inpatient Rehab Admissions Coordinator:  ? ?Per therapy recommendations,  patient was screened for CIR candidacy by Na Waldrip, MS, CCC-SLP. At this time, Pt. Appears to be a a potential candidate for CIR. I will place   order for rehab consult per protocol for full assessment. Please contact me any with questions. ? ?Legacy Carrender, MS, CCC-SLP ?Rehab Admissions Coordinator  ?[phone] (celll) ?[phone] (office) ? ?

## 2023-05-31 NOTE — Progress Notes (Signed)
  Progress Note    05/31/2023 9:54 AM 1 Day Post-Op  Subjective: Pain is tolerable, no overnight issues  Vitals:   05/31/23 0521 05/31/23 0740  BP: 128/75 (!) 165/81  Pulse: 69 74  Resp: 18 16  Temp: 98.3 F (36.8 C) 98.1 F (36.7 C)  SpO2: 98% 99%    Physical Exam: Awake alert and oriented Right below-knee amputation dressing clean dry intact  CBC    Component Value Date/Time   WBC 9.1 05/31/2023 0535   RBC 3.59 (L) 05/31/2023 0535   HGB 9.8 (L) 05/31/2023 0535   HCT 27.1 (L) 05/31/2023 0535   PLT 240 05/31/2023 0535   MCV 75.5 (L) 05/31/2023 0535   MCH 27.3 05/31/2023 0535   MCHC 36.2 (H) 05/31/2023 0535   RDW 13.2 05/31/2023 0535   LYMPHSABS 1.8 05/31/2023 0535   MONOABS 0.6 05/31/2023 0535   EOSABS 0.0 05/31/2023 0535   BASOSABS 0.0 05/31/2023 0535    BMET    Component Value Date/Time   NA 132 (L) 05/31/2023 0535   NA 131 (L) 12/14/2020 0849   K 4.0 05/31/2023 0535   CL 105 05/31/2023 0535   CO2 18 (L) 05/31/2023 0535   GLUCOSE 212 (H) 05/31/2023 0535   BUN 26 (H) 05/31/2023 0535   BUN 26 12/14/2020 0849   CREATININE 1.40 (H) 05/31/2023 0535   CALCIUM 9.0 05/31/2023 0535   GFRNONAA 56 (L) 05/31/2023 0535   GFRAA 53 (L) 12/14/2020 0849    INR    Component Value Date/Time   INR 1.1 01/27/2022 1856     Intake/Output Summary (Last 24 hours) at 05/31/2023 0954 Last data filed at 05/31/2023 0852 Gross per 24 hour  Intake 240 ml  Output 700 ml  Net -460 ml     Assessment:  61 y.o. male is s/p amputation for nonhealing transmetatarsal amputation.  Plan: Dressing down tomorrow, encouraged him to perform knee mobility exercises today and to replace the MB shield prior to out of bed.  Work with PT and will likely require inpatient rehab.   Natavia Sublette C. Randie Heinz, MD [facility] Office: [phone] Pager: [phone]  05/31/2023 9:54 AM

## 2023-05-31 NOTE — Evaluation (Signed)
Physical Therapy Evaluation Patient Details Name: Jose [name] MRN: [id number] DOB: [birth year] Today's Date: 05/31/2023  History of Present Illness  Pt is 62 y.o. 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 PMH - CAD, CKD, HTN, DM, COPD   Clinical Impression  Pt presents with condition above and deficits mentioned below, see PT Problem List. PTA, he was mod I utilizing a SPC prior to his TMA and a RW after his TMA. Pt is currently requiring min-modA for stand/squat pivot transfers, needing cues for hand placement to improve ease and safety with transfers. He demonstrates deficits in balance, activity tolerance, and R knee flexion AROM. He is limited by pain in his R leg, but motivated to improve and go to rehab to maximize his independence. Pt could likely get to a mod I level for transfers at least. Pt would be a great candidate for intensive inpatient rehab, >/= 3 hours/day. Will continue to follow acutely.        Assistance Recommended at Discharge Intermittent Supervision/Assistance  If plan is discharge home, recommend the following:  Can travel by private vehicle  A lot of help with walking and/or transfers;A little help with bathing/dressing/bathroom;Assistance with cooking/housework;Assist for transportation;Help with stairs or ramp for entrance        Equipment Recommendations Wheelchair (measurements PT);Wheelchair cushion (measurements PT)  Recommendations for Other Services  Rehab consult    Functional Status Assessment Patient has had a recent decline in their functional status and demonstrates the ability to make significant improvements in function in a reasonable and predictable amount of time.     Precautions / Restrictions Precautions Precautions: Fall Required Braces or Orthoses: Other Brace Other Brace: limb protector Restrictions Weight Bearing Restrictions: Yes RLE Weight Bearing:  Non weight bearing      Mobility  Bed Mobility Overal bed mobility: Needs Assistance Bed Mobility: Supine to Sit, Sit to Supine     Supine to sit: Supervision, HOB elevated Sit to supine: Supervision, HOB elevated   General bed mobility comments: Supervision for safety, HOB elevated and use of bed rails noted.    Transfers Overall transfer level: Needs assistance Equipment used: 1 person hand held assist Transfers: Sit to/from Stand, Bed to chair/wheelchair/BSC Sit to Stand: Min assist Stand pivot transfers: Mod assist   Squat pivot transfers: Min assist     General transfer comment: Pt cued to stand and pivot to L for increased ease, but pt reaching his R UE across to the distal bedside commode rail on his L, resulting in his body turning the opposite direction and him having to turn even further to correct it and stand pivot to L with LOB, modA to recover and safely get pt to bedside commode. Squat pivot to R with better hand placement noted after pt education, minA.    Ambulation/Gait               General Gait Details: Pt hopping slightly during transfers  Stairs            Wheelchair Mobility     Tilt Bed    Modified Rankin (Stroke Patients Only)       Balance Overall balance assessment: Needs assistance Sitting-balance support: No upper extremity supported, Feet supported Sitting balance-Leahy Scale: Normal Sitting balance - Comments: Reaches off BOS to wash self and perform pericare sitting on bedside commode and EOB without LOB   Standing balance support: Single extremity  supported, Bilateral upper extremity supported Standing balance-Leahy Scale: Poor Standing balance comment: Reliant on UE support and up to modA to recover during LOB bouts.                             Pertinent Vitals/Pain Pain Assessment Pain Assessment: Faces Faces Pain Scale: Hurts little more Pain Location: R residual limb Pain Descriptors / Indicators:  Discomfort, Grimacing, Guarding, Operative site guarding Pain Intervention(s): Limited activity within patient's tolerance, Monitored during session, Repositioned, Premedicated before session    Home Living Family/patient expects to be discharged to:: Private residence Living Arrangements: Alone (May stay with family for a while) Available Help at Discharge: Friend(s);Available PRN/intermittently Type of Home: House Home Access: Level entry       Home Layout: One level Home Equipment: Cane - single Librarian, academic (2 wheels);Tub bench;BSC/3in1 Additional Comments: info carried over from prior admission as it was recent    Prior Function Prior Level of Function : Independent/Modified Independent             Mobility Comments: SPC to ambulate prior to TMA, RW after TMA ADLs Comments: ind     Hand Dominance   Dominant Hand: Right    Extremity/Trunk Assessment   Upper Extremity Assessment Upper Extremity Assessment: Defer to OT evaluation    Lower Extremity Assessment Lower Extremity Assessment: RLE deficits/detail RLE Deficits / Details: S/p BKA 7/6; reports he can feel light touch distally on residual limb; limited knee flexion AROM to ~110' due to pain but knee extension AROM WFL    Cervical / Trunk Assessment Cervical / Trunk Assessment: Normal  Communication   Communication: No difficulties  Cognition Arousal/Alertness: Awake/alert Behavior During Therapy: WFL for tasks assessed/performed Overall Cognitive Status: Within Functional Limits for tasks assessed                                          General Comments General comments (skin integrity, edema, etc.): educated pt on positioning R leg in knee extension, use of limb protector, and to perform knee flexion AROM as HEP - he verbalized understanding    Exercises     Assessment/Plan    PT Assessment Patient needs continued PT services  PT Problem List Decreased range of  motion;Decreased strength;Decreased activity tolerance;Decreased balance;Decreased mobility;Decreased knowledge of precautions;Decreased skin integrity;Pain       PT Treatment Interventions DME instruction;Gait training;Functional mobility training;Stair training;Therapeutic activities;Therapeutic exercise;Balance training;Neuromuscular re-education;Patient/family education;Wheelchair mobility training    PT Goals (Current goals can be found in the Care Plan section)  Acute Rehab PT Goals Patient Stated Goal: to improve and go to rehab PT Goal Formulation: With patient Time For Goal Achievement: 06/14/23 Potential to Achieve Goals: Good    Frequency Min 3X/week     Co-evaluation               AM-PAC PT "6 Clicks" Mobility  Outcome Measure Help needed turning from your back to your side while in a flat bed without using bedrails?: A Little Help needed moving from lying on your back to sitting on the side of a flat bed without using bedrails?: A Little Help needed moving to and from a bed to a chair (including a wheelchair)?: A Lot Help needed standing up from a chair using your arms (e.g., wheelchair or bedside chair)?: A Little Help needed to  walk in hospital room?: Total Help needed climbing 3-5 steps with a railing? : Total 6 Click Score: 13    End of Session   Activity Tolerance: Patient tolerated treatment well Patient left: in bed;with call bell/phone within reach;with bed alarm set Nurse Communication: Mobility status PT Visit Diagnosis: Unsteadiness on feet (R26.81);Other abnormalities of gait and mobility (R26.89);Muscle weakness (generalized) (M62.81);Difficulty in walking, not elsewhere classified (R26.2);Pain Pain - Right/Left: Right Pain - part of body: Leg    Time: 6644-0347 PT Time Calculation (min) (ACUTE ONLY): 27 min   Charges:   PT Evaluation $PT Eval Moderate Complexity: 1 Mod PT Treatments $Therapeutic Activity: 8-22 mins PT General Charges $$  ACUTE PT VISIT: 1 Visit         Raymond Gurney, PT, DPT Acute Rehabilitation Services  Office: [phone]   Jewel Baize 05/31/2023, 11:23 AM

## 2023-05-31 NOTE — Progress Notes (Signed)
PROGRESS NOTE    Jose [name]  ZOX:[id number] DOB: [birth year] DOA: 05/27/2023 PCP: [name]    Brief Narrative:  62 year old with history of coronary artery disease status post PCI, COPD, CKD stage IIIb, type 2 diabetes, hypertension hyperlipidemia, significant peripheral artery disease status post transmetatarsal amputation on 05/12/2023 presented with increasing pain and tissue loss at the TMA site.  Seen at vascular office and sent to admit for more proximal amputation.   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   Discontinue antibiotics.   Postop pain management, start mobilizing with PT OT.  May need rehab.    Hypomagnesemia: Will replace aggressively.  Chronic medical issues including CKD stage IIIb, renal function stable at baseline. Type 2 diabetes, well-controlled on insulin regimen. Essential hypertension, on amlodipine and Coreg. Coronary artery disease, stable on aspirin, Plavix, atorvastatin and Coreg. COPD, without exacerbation.  Stable. Hyperlipidemia, on atorvastatin.  Stable.   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   2 g 200 mL/hr over 30 Minutes Intravenous Every 24 hours 05/27/23 2029 05/30/23 2052   05/27/23 2030  metroNIDAZOLE (FLAGYL) IVPB 500 mg       See Hyperspace for full Linked Orders Report.   500 mg 100 mL/hr over 60 Minutes  Intravenous Every 12 hours 05/27/23 2029 05/31/23 0858         Subjective: Patient seen and examined.  Pain controlled.  No new events.  Vascular surgery remove the brace and plan to examine dressing tomorrow.  He will likely need rehab.  Objective: Vitals:   05/30/23 2024 05/30/23 2100 05/31/23 0521 05/31/23 0740  BP: 124/78  128/75 (!) 165/81  Pulse: 72  69 74  Resp: 16  18 16   Temp: 98.1 F (36.7 C)  98.3 F (36.8 C) 98.1 F (36.7 C)  TempSrc: Oral   Oral  SpO2: 99% 99% 98% 99%  Weight:   66.9 kg   Height:        Intake/Output Summary (Last 24 hours) at 05/31/2023 1047 Last data filed at 05/31/2023 0852 Gross per 24 hour  Intake 240 ml  Output 700 ml  Net -460 ml    Filed Weights   05/27/23 1505 05/30/23 0500 05/31/23 0521  Weight: 81.6 kg 69.6 kg 66.9 kg    Examination:  Physical Exam Eyes:     Pupils: Pupils are equal, round, and reactive to light.  Cardiovascular:     Rate and Rhythm: Normal rate and regular rhythm.     Pulses: Normal pulses.  Pulmonary:     Effort: Pulmonary effort is normal.     Breath sounds: Normal breath sounds.  Abdominal:     General: Abdomen is flat. Bowel sounds are normal.     Palpations: Abdomen is soft.  Musculoskeletal:     Cervical back: Normal range of motion.     Comments: Right leg post op dressing and knee immobilizer  Neurological:     Mental Status: He is alert.        Data Reviewed: I have personally reviewed following labs and imaging studies  CBC: Recent Labs  Lab 05/27/23 1518 05/28/23 0108 05/31/23 0535  WBC 8.0 9.3 9.1  NEUTROABS 5.1  --  6.7  HGB 12.1* 12.4* 9.8*  HCT 33.5* 34.2* 27.1*  MCV 75.5* 77.2* 75.5*  PLT 268 260 240    Basic Metabolic Panel: Recent Labs  Lab 05/27/23 1518 05/28/23 0108 05/31/23 0535  NA 132* 135 132*  K 3.4* 3.5 4.0  CL 102 106 105  CO2 15* 16* 18*  GLUCOSE 174* 198* 212*  BUN 25* 23 26*  CREATININE 1.73* 1.68* 1.40*  CALCIUM 9.8 9.8 9.0  MG  --   --   1.5*    GFR: Estimated Creatinine Clearance: 50.4 mL/min (A) (by C-G formula based on SCr of 1.4 mg/dL (H)). Liver Function Tests: Recent Labs  Lab 05/27/23 1518  AST 24  ALT 31  ALKPHOS 72  BILITOT 0.7  PROT 6.7  ALBUMIN 3.3*    No results for input(s): "LIPASE", "AMYLASE" in the last 168 hours. No results for input(s): "AMMONIA" in the last 168 hours. Coagulation Profile: No results for input(s): "INR", "PROTIME" in the last 168 hours. Cardiac Enzymes: No results for input(s): "CKTOTAL", "CKMB", "CKMBINDEX", "TROPONINI" in the last 168 hours. BNP (last 3 results) No results for input(s): "PROBNP" in the last 8760 hours. HbA1C: No results for input(s): "HGBA1C" in the last 72 hours. CBG: Recent Labs  Lab 05/30/23 0908 05/30/23 1231 05/30/23 1540 05/30/23 2030 05/31/23 0739  GLUCAP 145* 238* 260* 312* 218*    Lipid Profile: No results for input(s): "CHOL", "HDL", "LDLCALC", "TRIG", "CHOLHDL", "LDLDIRECT" in the last 72 hours. Thyroid Function Tests: No results for input(s): "TSH", "T4TOTAL", "FREET4", "T3FREE", "THYROIDAB" in the last 72 hours. Anemia Panel: No results for input(s): "VITAMINB12", "FOLATE", "FERRITIN", "TIBC", "IRON", "RETICCTPCT" in the last 72 hours. Sepsis Labs: Recent Labs  Lab 05/27/23 1518  LATICACIDVEN 1.8     Recent Results (from the past 240 hour(s))  Blood Cultures x 2 sites     Status: None (Preliminary result)   Collection Time: 05/27/23 10:25 PM   Specimen: BLOOD  Result Value Ref Range Status   Specimen Description BLOOD SITE NOT SPECIFIED  Final   Special Requests   Final    BOTTLES DRAWN AEROBIC AND ANAEROBIC Blood Culture adequate volume   Culture   Final    NO GROWTH 4 DAYS Performed at [facility], [address], [place], [place] [address]    Report Status PENDING  Incomplete  Blood Cultures x 2 sites     Status: None (Preliminary result)   Collection Time: 05/28/23  1:08 AM   Specimen: BLOOD LEFT HAND   Result Value Ref Range Status   Specimen Description BLOOD LEFT HAND  Final   Special Requests   Final    BOTTLES DRAWN AEROBIC AND ANAEROBIC Blood Culture adequate volume   Culture   Final    NO GROWTH 3 DAYS Performed at [facility], [address] [place], [place] [address]    Report Status PENDING  Incomplete         Radiology Studies: No results found.      Scheduled Meds:  amitriptyline  25 mg Oral QHS   amLODipine  10 mg Oral Daily   aspirin EC  81 mg Oral Daily   atorvastatin  80  mg Oral QPM   carvedilol  12.5 mg Oral BID WC   Chlorhexidine Gluconate Cloth  6 each Topical Q0600   heparin  5,000 Units Subcutaneous Q8H   insulin aspart  0-5 Units Subcutaneous QHS   insulin aspart  0-9 Units Subcutaneous TID WC   insulin glargine-yfgn  20 Units Subcutaneous Daily   magnesium oxide  400 mg Oral BID   mometasone-formoterol  2 puff Inhalation BID   oxyCODONE  10 mg Oral Q12H   senna  1 tablet Oral BID   Continuous Infusions:  magnesium sulfate bolus IVPB       LOS: 4 days    Time spent: 35 minutes    Dorcas Carrow, MD [facility]

## 2023-05-31 NOTE — Plan of Care (Signed)

## 2023-06-01 ENCOUNTER — Encounter (HOSPITAL_COMMUNITY): Payer: Self-pay | Admitting: Vascular Surgery

## 2023-06-01 DIAGNOSIS — T8189XA Other complications of procedures, not elsewhere classified, initial encounter: Secondary | ICD-10-CM | POA: Diagnosis not present

## 2023-06-01 LAB — GLUCOSE, CAPILLARY
Glucose-Capillary: 172 mg/dL — ABNORMAL HIGH (ref 70–99)
Glucose-Capillary: 253 mg/dL — ABNORMAL HIGH (ref 70–99)
Glucose-Capillary: 287 mg/dL — ABNORMAL HIGH (ref 70–99)
Glucose-Capillary: 141 mg/dL — ABNORMAL HIGH (ref 70–99)
Glucose-Capillary: 93 mg/dL (ref 70–99)

## 2023-06-01 MED ORDER — GABAPENTIN 100 MG PO CAPS
100.0000 mg | ORAL_CAPSULE | Freq: Three times a day (TID) | ORAL | Status: DC
  Administered 2023-06-01 – 2023-06-02 (×6): 100 mg via ORAL
  Filled 2023-06-01 (×6): qty 1

## 2023-06-01 MED ORDER — OXYCODONE-ACETAMINOPHEN 5-325 MG PO TABS
1.0000 | ORAL_TABLET | ORAL | Status: DC | PRN
  Administered 2023-06-02 – 2023-06-03 (×2): 1 via ORAL
  Filled 2023-06-01 (×2): qty 1

## 2023-06-01 MED ORDER — INSULIN GLARGINE-YFGN 100 UNIT/ML ~~LOC~~ SOLN
30.0000 [IU] | Freq: Every day | SUBCUTANEOUS | Status: DC
  Administered 2023-06-01 – 2023-06-07 (×7): 30 [IU] via SUBCUTANEOUS
  Filled 2023-06-01 (×7): qty 0.3

## 2023-06-01 NOTE — Progress Notes (Addendum)
  Progress Note    06/01/2023 7:12 AM 2 Days Post-Op  Subjective:  c/o pain in the right stump overnight.    Afebrile  Vitals:   05/31/23 1931 06/01/23 0333  BP: 114/68 (!) 170/89  Pulse: 68 79  Resp: 17 16  Temp: 98.4 F (36.9 C) 98 F (36.7 C)  SpO2: 97% 99%    Physical Exam: Incisions:  dressing removed and stump looks good with staples in tact.     CBC    Component Value Date/Time   WBC 9.1 05/31/2023 0535   RBC 3.59 (L) 05/31/2023 0535   HGB 9.8 (L) 05/31/2023 0535   HCT 27.1 (L) 05/31/2023 0535   PLT 240 05/31/2023 0535   MCV 75.5 (L) 05/31/2023 0535   MCH 27.3 05/31/2023 0535   MCHC 36.2 (H) 05/31/2023 0535   RDW 13.2 05/31/2023 0535   LYMPHSABS 1.8 05/31/2023 0535   MONOABS 0.6 05/31/2023 0535   EOSABS 0.0 05/31/2023 0535   BASOSABS 0.0 05/31/2023 0535    BMET    Component Value Date/Time   NA 132 (L) 05/31/2023 0535   NA 131 (L) 12/14/2020 0849   K 4.0 05/31/2023 0535   CL 105 05/31/2023 0535   CO2 18 (L) 05/31/2023 0535   GLUCOSE 212 (H) 05/31/2023 0535   BUN 26 (H) 05/31/2023 0535   BUN 26 12/14/2020 0849   CREATININE 1.40 (H) 05/31/2023 0535   CALCIUM 9.0 05/31/2023 0535   GFRNONAA 56 (L) 05/31/2023 0535   GFRAA 53 (L) 12/14/2020 0849    INR    Component Value Date/Time   INR 1.1 01/27/2022 1856     Intake/Output Summary (Last 24 hours) at 06/01/2023 1610 Last data filed at 05/31/2023 1112 Gross per 24 hour  Intake 140 ml  Output --  Net 140 ml     Assessment/Plan:  62 y.o. male is s/p right below knee amputation   2 Days Post-Op   -dressing taken down and incision looks good with staples in tact.  -continue dry dressing changes daily with 4x4, kerlix and ace.  -f/u in our office in 4-5 weeks for staple removal.   -ok to cleanse incision with soap and water, pat dry and apply dressing.  -hopeful pt can be admitted to [facility] for further therapy   Doreatha Massed, PA-C [facility] [phone] 06/01/2023 7:12 AM   I have independently interviewed and examined patient and agree with PA assessment and plan above.   Cecilio Ohlrich C. Randie Heinz, MD [facility] [place] Office: [phone] Pager: [phone]

## 2023-06-01 NOTE — Consult Note (Signed)
   [facility] Inpatient Consult   06/01/2023  Jose [name] 07/01/1961 [id number]  [facility] [[facility]]  Accountable Care Organization [ACO] Patient:  Jose [name]  Primary Care Provider:  [name]   Patient screened for less than 30 days readmission hospitalization with noted extreme high risk score for unplanned readmission risk and to assess for potential [facility]  [[facility]] Care Management service needs for post hospital transition for care coordination.  Review of patient's electronic medical record reveals patient is for possible CIR, lived alone prior to admission, poss family  Review of patient's medical record for past medical history and membership affiliate roster reveals this patient is in a [employer] [Special Needs Program] member and will be followed with the [facility] Medicare assigned team member in that program for care coordination.  For questions contact:   Charlesetta Shanks, RN BSN CCM [facility]  [phone] business mobile phone Toll free office [phone]  *Concierge Line  [phone] Fax number: [phone] [email or web address]

## 2023-06-01 NOTE — Evaluation (Signed)
Occupational Therapy Evaluation Patient Details Name: Jose [name] MRN: [id number] DOB: 14-Nov-1961 Today's Date: 06/01/2023   History of Present Illness Pt is 62 y.o. 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 PMH - CAD, CKD, HTN, DM, COPD   Clinical Impression   Pt evaluated s/p above admission list. Pt reports modified independence with ADL/IADLs and functional mobility with use of RW at baseline. Pt presents this session with R residual limb pain, decreased strength and balance. Pt refused OOB activity secondary to pain this session however, agreeable to EOB activity with encouragement. Pt educated on pain management strategies for phantom limb pain with pt verbalizing/demonstrating understanding. Pt with R limb protector doffed upon arrival. Pt donned limb protector during session and educated on wear schedule. Pt currently requires setup A for seated UB ADLs and min A for LB ADLs. Pt completed supine<>EOB sit transfer with HOB raised and supervision. Pt would benefit from continued acute OT services to maximize functional independence and facilitate transition to intensive inpatient follow up therapy, >3 hours/day after discharge.       Recommendations for follow up therapy are one component of a multi-disciplinary discharge planning process, led by the attending physician.  Recommendations may be updated based on patient status, additional functional criteria and insurance authorization.   Assistance Recommended at Discharge Frequent or constant Supervision/Assistance  Patient can return home with the following A little help with walking and/or transfers;A little help with bathing/dressing/bathroom;Assistance with cooking/housework;Assist for transportation;Help with stairs or ramp for entrance    Functional Status Assessment  Patient has had a recent decline in their functional status and demonstrates the  ability to make significant improvements in function in a reasonable and predictable amount of time.  Equipment Recommendations  Other (comment) (defer)    Recommendations for Other Services Rehab consult     Precautions / Restrictions Precautions Precautions: Fall Required Braces or Orthoses: Other Brace Other Brace: limb protector Restrictions Weight Bearing Restrictions: Yes RLE Weight Bearing: Non weight bearing      Mobility Bed Mobility Overal bed mobility: Needs Assistance Bed Mobility: Supine to Sit, Sit to Supine     Supine to sit: Supervision, HOB elevated Sit to supine: Supervision, HOB elevated   General bed mobility comments: use of bed rails    Transfers                   General transfer comment: Not assessed, pt refused OOB activity secondary to pain.      Balance Overall balance assessment: Needs assistance Sitting-balance support: No upper extremity supported, Feet supported Sitting balance-Leahy Scale: Good Sitting balance - Comments: sitting EOB                                   ADL either performed or assessed with clinical judgement   ADL Overall ADL's : Needs assistance/impaired Eating/Feeding: Modified independent;Sitting   Grooming: Set up;Sitting   Upper Body Bathing: Set up;Sitting   Lower Body Bathing: Min guard;Sitting/lateral leans   Upper Body Dressing : Set up;Sitting   Lower Body Dressing: Sitting/lateral leans;Bed level;Minimal assistance Lower Body Dressing Details (indicate cue type and reason): Pt donned L sock at bed level with setup A. Pt donned R limb protector with min A at bed level. Toilet Transfer: Set up (bed level for urinal use;) Toilet Transfer Details (indicate cue  type and reason): pt refused OOB this session, anticipate mod A for stand pivot transfer to [facility] Toileting- Clothing Manipulation and Hygiene: Minimal assistance;Bed level       Functional mobility during ADLs:  Supervision/safety (For bed mobility) General ADL Comments: Pt declined OOB activity, anticipate mod A for stand pivot transfers     Vision Baseline Vision/History: 0 No visual deficits Ability to See in Adequate Light: 0 Adequate Vision Assessment?: No apparent visual deficits     Perception Perception Perception Tested?: No   Praxis Praxis Praxis tested?: Not tested    Pertinent Vitals/Pain Pain Assessment Pain Assessment: Faces Faces Pain Scale: Hurts whole lot Pain Location: R residual limb Pain Descriptors / Indicators: Discomfort, Grimacing, Guarding, Operative site guarding Pain Intervention(s): Limited activity within patient's tolerance, Monitored during session     Hand Dominance Right   Extremity/Trunk Assessment Upper Extremity Assessment Upper Extremity Assessment: Overall WFL for tasks assessed   Lower Extremity Assessment Lower Extremity Assessment: Defer to PT evaluation   Cervical / Trunk Assessment Cervical / Trunk Assessment: Normal   Communication Communication Communication: No difficulties   Cognition Arousal/Alertness: Awake/alert Behavior During Therapy: WFL for tasks assessed/performed Overall Cognitive Status: Within Functional Limits for tasks assessed                                 General Comments: Pt refusing OOB activity secondary to pain, agreeable to EOB transfer     General Comments  VSS on RA, R limb protector donned during session    Exercises     Shoulder Instructions      Home Living Family/patient expects to be discharged to:: Private residence Living Arrangements: Alone (may stay with family for a while) Available Help at Discharge: Friend(s);Available PRN/intermittently Type of Home: House Home Access: Level entry     Home Layout: One level     Bathroom Shower/Tub: Chief Strategy Officer: Standard     Home Equipment: Cane - single Librarian, academic (2 wheels);Tub  bench;BSC/3in1   Additional Comments: info carried over from prior admission as it was recent      Prior Functioning/Environment Prior Level of Function : Independent/Modified Independent             Mobility Comments: SPC to ambulate prior to TMA, RW after TMA ADLs Comments: Indep with ADL/IADLs        OT Problem List: Decreased strength;Decreased range of motion;Decreased activity tolerance;Impaired balance (sitting and/or standing);Pain      OT Treatment/Interventions: Self-care/ADL training;Therapeutic exercise;Energy conservation;DME and/or AE instruction;Therapeutic activities;Patient/family education;Balance training    OT Goals(Current goals can be found in the care plan section) Acute Rehab OT Goals Patient Stated Goal: to manage pain OT Goal Formulation: With patient Time For Goal Achievement: 06/15/23 Potential to Achieve Goals: Good ADL Goals Pt Will Perform Upper Body Dressing: with modified independence;sitting Pt Will Perform Lower Body Dressing: with modified independence;sitting/lateral leans Pt Will Transfer to Toilet: stand pivot transfer;with modified independence;bedside commode Pt Will Perform Toileting - Clothing Manipulation and hygiene: with modified independence;sitting/lateral leans  OT Frequency: Min 2X/week    Co-evaluation              AM-PAC OT "6 Clicks" Daily Activity     Outcome Measure Help from another person eating meals?: None Help from another person taking care of personal grooming?: A Little Help from another person toileting, which includes using toliet, bedpan, or urinal?: A Little  Help from another person bathing (including washing, rinsing, drying)?: A Little Help from another person to put on and taking off regular upper body clothing?: A Little Help from another person to put on and taking off regular lower body clothing?: A Little 6 Click Score: 19   End of Session Equipment Utilized During Treatment: Other (comment)  (none) Nurse Communication: Mobility status  Activity Tolerance: Patient limited by pain Patient left: in bed;with call bell/phone within reach  OT Visit Diagnosis: Unsteadiness on feet (R26.81);Pain;Muscle weakness (generalized) (M62.81)                Time: 1610-9604 OT Time Calculation (min): 12 min Charges:  OT General Charges $OT Visit: 1 Visit OT Evaluation $OT Eval Moderate Complexity: 1 Mod  Sherley Bounds, OTS Acute Rehabilitation Services Office [phone] Secure Chat Communication Preferred   Sherley Bounds 06/01/2023, 12:10 PM

## 2023-06-01 NOTE — Progress Notes (Signed)
PT Cancellation Note  Patient Details Name: Jose [name] MRN: [id number] DOB: June 12, 1961   Cancelled Treatment:    Reason Eval/Treat Not Completed: (P) Pain limiting ability to participate, pt reporting increased pain this date s/p mobility with OT this morning. Will check back as schedule allows to continue with PT POC.  Lenora Boys. PTA Acute Rehabilitation Services Office: [phone]    Catalina Antigua 06/01/2023, 2:16 PM

## 2023-06-01 NOTE — Progress Notes (Signed)
Inpatient Rehab Admissions:  Inpatient Rehab Consult received.  I met with patient at the bedside for rehabilitation assessment and to discuss goals and expectations of an inpatient rehab admission.  Discussed average length of stay, insurance authorization requirement, discharge home after completion of CIR. Pt acknowledged understanding. Pt interested in pursuing CIR. Pt gave permission to contact niece [place]. Called niece and no one answered. Left a message; awaiting return call. Will continue to follow.    Signed: Wolfgang Phoenix, MS, CCC-SLP Admissions Coordinator [phone]

## 2023-06-01 NOTE — Plan of Care (Signed)

## 2023-06-01 NOTE — Progress Notes (Signed)
PROGRESS NOTE    Jose [name]  ZOX:[id number] DOB: 1961/09/15 DOA: 05/27/2023 PCP: [name]    Brief Narrative:  61 year old with history of coronary artery disease status post PCI, COPD, CKD stage IIIb, type 2 diabetes, hypertension hyperlipidemia, significant peripheral artery disease status post transmetatarsal amputation on 05/12/2023 presented with increasing pain and tissue loss at the TMA site.  Seen at vascular office and sent to admit for more proximal amputation. Underwent right BKA, significant phantom leg pain and surgical site pain.   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 Discontinue antibiotics.   No significant surgical pain today along with phantom limb. Dilaudid 1 mg IV every 1 hour as needed OxyContin twice daily Add Percocet 5/325 every 4 hours as needed He is on amitriptyline at night, will add gabapentin 100 mg 3 times daily today. Mobilize with PT OT.  Anticipate CIR admission once pain is relieved.  Hypomagnesemia: Replaced with improvement.  Type 2 diabetes with hyperglycemia: Now with oral intake.  Increased long-acting insulin to 30 units that he takes at home.  Chronic medical issues including CKD stage IIIb, renal function stable at baseline. Essential hypertension, on amlodipine and Coreg. Coronary artery disease, stable on aspirin, Plavix, atorvastatin and Coreg. COPD, without exacerbation.  Stable. Hyperlipidemia, on atorvastatin.  Stable.   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   2 g 200 mL/hr over 30 Minutes Intravenous Every 24 hours 05/27/23 2029 05/30/23 2052   05/27/23 2030  metroNIDAZOLE (FLAGYL) IVPB 500 mg       See Hyperspace for full Linked Orders Report.   500 mg 100 mL/hr over 60 Minutes Intravenous Every 12 hours 05/27/23 2029 05/31/23 0858         Subjective:  Patient seen and examined.  Overnight events noted.  He had severe unrelenting pain itching and discomfort at the amputation site.  Received multiple pain medications.  He worked with physical therapy and had more pain.  Wants to get some good sleep at night.  Objective: Vitals:   05/31/23 1437 05/31/23 1931 06/01/23 0333 06/01/23 0735  BP: 125/66 114/68 (!) 170/89 (!) 185/94  Pulse: 67 68 79 87  Resp: 16 17 16 17   Temp: 97.9 F (36.6 C) 98.4 F (36.9 C) 98 F (36.7 C) (!) 97.5 F (36.4 C)  TempSrc: Oral Oral Oral Oral  SpO2: 98% 97% 99% 100%  Weight:      Height:        Intake/Output Summary (Last 24 hours) at 06/01/2023 1041 Last data filed at 06/01/2023 0852 Gross per 24 hour  Intake 140 ml  Output 400 ml  Net -260 ml    Filed Weights   05/27/23 1505 05/30/23 0500 05/31/23 0521  Weight: 81.6 kg 69.6 kg 66.9 kg    Examination:  Physical Exam Constitutional:      Comments: Appropriately anxious today.  Eyes:     Pupils:  Pupils are equal, round, and reactive to light.  Cardiovascular:     Rate and Rhythm: Normal rate and regular rhythm.     Pulses: Normal pulses.  Pulmonary:     Effort: Pulmonary effort is normal.     Breath sounds: Normal breath sounds.  Abdominal:     General: Abdomen is flat. Bowel sounds are normal.     Palpations: Abdomen is soft.  Musculoskeletal:     Cervical back: Normal range of motion.     Comments: Right leg post op dressing and knee immobilizer   Neurological:     Mental  Status: He is alert.     Anxious.  Mildly distressed with pain.   Data Reviewed: I have personally reviewed following labs and imaging studies  CBC: Recent Labs  Lab 05/27/23 1518 05/28/23 0108 05/31/23 0535  WBC 8.0 9.3 9.1  NEUTROABS 5.1  --  6.7  HGB 12.1* 12.4* 9.8*  HCT 33.5* 34.2* 27.1*  MCV 75.5* 77.2* 75.5*  PLT 268 260 240    Basic Metabolic Panel: Recent Labs  Lab 05/27/23 1518 05/28/23 0108 05/31/23 0535  NA 132* 135 132*  K 3.4* 3.5 4.0  CL 102 106 105  CO2 15* 16* 18*  GLUCOSE 174* 198* 212*  BUN 25* 23 26*  CREATININE 1.73* 1.68* 1.40*  CALCIUM 9.8 9.8 9.0  MG  --   --  1.5*    GFR: Estimated Creatinine Clearance: 50.4 mL/min (A) (by C-G formula based on SCr of 1.4 mg/dL (H)). Liver Function Tests: Recent Labs  Lab 05/27/23 1518  AST 24  ALT 31  ALKPHOS 72  BILITOT 0.7  PROT 6.7  ALBUMIN 3.3*    No results for input(s): "LIPASE", "AMYLASE" in the last 168 hours. No results for input(s): "AMMONIA" in the last 168 hours. Coagulation Profile: No results for input(s): "INR", "PROTIME" in the last 168 hours. Cardiac Enzymes: No results for input(s): "CKTOTAL", "CKMB", "CKMBINDEX", "TROPONINI" in the last 168 hours. BNP (last 3 results) No results for input(s): "PROBNP" in the last 8760 hours. HbA1C: No results for input(s): "HGBA1C" in the last 72 hours. CBG: Recent Labs  Lab 05/31/23 1118 05/31/23 1559 05/31/23 1932 05/31/23 2135 06/01/23 0737  GLUCAP 224* 205* 241* 238* 253*    Lipid Profile: No results for input(s): "CHOL", "HDL", "LDLCALC", "TRIG", "CHOLHDL", "LDLDIRECT" in the last 72 hours. Thyroid Function Tests: No results for input(s): "TSH", "T4TOTAL", "FREET4", "T3FREE", "THYROIDAB" in the last 72 hours. Anemia Panel: No results for input(s): "VITAMINB12", "FOLATE", "FERRITIN", "TIBC", "IRON", "RETICCTPCT" in the last 72 hours. Sepsis Labs: Recent Labs  Lab 05/27/23 1518  LATICACIDVEN 1.8     Recent Results  (from the past 240 hour(s))  Blood Cultures x 2 sites     Status: None   Collection Time: 05/27/23 10:25 PM   Specimen: BLOOD  Result Value Ref Range Status   Specimen Description BLOOD SITE NOT SPECIFIED  Final   Special Requests   Final    BOTTLES DRAWN AEROBIC AND ANAEROBIC Blood Culture adequate volume   Culture   Final    NO GROWTH 5 DAYS Performed at [facility], [address], [place], [place] [address]    Report Status 06/01/2023 FINAL  Final  Blood Cultures x 2 sites     Status: None (Preliminary result)   Collection Time: 05/28/23  1:08 AM   Specimen: BLOOD LEFT HAND  Result Value Ref Range Status   Specimen Description BLOOD LEFT HAND  Final  Special Requests   Final    BOTTLES DRAWN AEROBIC AND ANAEROBIC Blood Culture adequate volume   Culture   Final    NO GROWTH 4 DAYS Performed at [facility], [address] [place], [place] [address]    Report Status PENDING  Incomplete         Radiology Studies: No results found.      Scheduled Meds:  amitriptyline  25 mg Oral QHS   amLODipine  10 mg Oral Daily   aspirin EC  81 mg Oral Daily   atorvastatin  80 mg Oral QPM   carvedilol  12.5 mg Oral BID WC   Chlorhexidine Gluconate Cloth  6 each Topical Q0600   gabapentin  100 mg Oral TID   heparin  5,000 Units Subcutaneous Q8H   insulin aspart  0-5 Units Subcutaneous QHS   insulin aspart  0-9 Units Subcutaneous TID WC   insulin glargine-yfgn  30 Units Subcutaneous Daily   magnesium oxide  400 mg Oral BID   mometasone-formoterol  2 puff Inhalation BID   oxyCODONE  10 mg Oral Q12H   senna  1 tablet Oral BID   Continuous Infusions:     LOS: 5 days    Time spent: 35 minutes    Dorcas Carrow, MD [facility]

## 2023-06-01 NOTE — Plan of Care (Signed)
  Problem: Activity: Goal: Ability to return to baseline activity level will improve Outcome: Progressing   Problem: Health Behavior/Discharge Planning: Goal: Ability to manage health-related needs will improve Outcome: Progressing   Problem: Activity: Goal: Risk for activity intolerance will decrease Outcome: Progressing   Problem: Coping: Goal: Level of anxiety will decrease Outcome: Progressing   Problem: Elimination: Goal: Will not experience complications related to bowel motility Outcome: Progressing   Problem: Pain Managment: Goal: General experience of comfort will improve Outcome: Progressing

## 2023-06-01 NOTE — Progress Notes (Addendum)
On call [facility] contacted overnight re: unrelenting pain w/ no relief, see eMAR for changes. Pt states pain increase began w/ mobility exercises today.   On call vascular contacted re: pain, no new orders  Pt originally declined to let this RN remove wrap on amputation site. Pt finally allowed this RN to remove wrap after on call vascular contacted re: pain and no new orders given.  Pt experienced relief w/ adjusted pain regiment and repositioning/readjustment of surgical site

## 2023-06-02 DIAGNOSIS — T8189XA Other complications of procedures, not elsewhere classified, initial encounter: Secondary | ICD-10-CM | POA: Diagnosis not present

## 2023-06-02 LAB — CULTURE, BLOOD (ROUTINE X 2): Culture: NO GROWTH

## 2023-06-02 LAB — GLUCOSE, CAPILLARY
Glucose-Capillary: 175 mg/dL — ABNORMAL HIGH (ref 70–99)
Glucose-Capillary: 258 mg/dL — ABNORMAL HIGH (ref 70–99)
Glucose-Capillary: 195 mg/dL — ABNORMAL HIGH (ref 70–99)
Glucose-Capillary: 102 mg/dL — ABNORMAL HIGH (ref 70–99)

## 2023-06-02 LAB — SURGICAL PATHOLOGY

## 2023-06-02 MED ORDER — GLUCERNA SHAKE PO LIQD
237.0000 mL | Freq: Three times a day (TID) | ORAL | Status: DC
  Administered 2023-06-02 – 2023-06-11 (×18): 237 mL via ORAL
  Filled 2023-06-02 (×2): qty 237

## 2023-06-02 MED ORDER — HYDROMORPHONE HCL 1 MG/ML IJ SOLN
1.0000 mg | INTRAMUSCULAR | Status: DC | PRN
  Administered 2023-06-02 – 2023-06-04 (×14): 1 mg via INTRAVENOUS
  Filled 2023-06-02 (×14): qty 1

## 2023-06-02 NOTE — Progress Notes (Signed)
PROGRESS NOTE    Jose [name]  ZOX:[id number] DOB: 02-03-1961 DOA: 05/27/2023 PCP: [name]    Brief Narrative:  62 year old with history of coronary artery disease status post PCI, COPD, CKD stage IIIb, type 2 diabetes, hypertension hyperlipidemia, significant peripheral artery disease status post transmetatarsal amputation on 05/12/2023 presented with increasing pain and tissue loss at the TMA site.  Seen at vascular office and sent to admit for more proximal amputation. Underwent right BKA, significant phantom leg pain and surgical site pain.   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  Has significant surgical pain today along with phantom limb. Continue OxyContin and Dilaudid.  Percocet added. He is on amitriptyline at night, will add gabapentin 100 mg 3 times daily today. Mobilize with PT OT.  Anticipate CIR admission once pain is relieved.  Hypomagnesemia: Replaced with improvement.  Type 2 diabetes with hyperglycemia: Now with oral intake.  Increased long-acting insulin to 30 units that he takes at home.  Chronic medical issues including CKD stage IIIb, renal function stable at baseline. Essential hypertension, on amlodipine and Coreg. Coronary artery disease, stable on aspirin, Plavix, atorvastatin and Coreg. COPD, without exacerbation.  Stable. Hyperlipidemia, on atorvastatin.  Stable.   DVT prophylaxis: heparin injection 5,000 Units Start: 05/31/23 0600   Code Status: Full code Family Communication: None at bedside Disposition Plan: Status is: Inpatient Remains inpatient appropriate because: Pain control postop.   Consultants:  Vascular  Procedures:  Right below-knee amputation 7/6  Antimicrobials:  Anti-infectives (From admission, onward)    Start     Dose/Rate Route Frequency Ordered Stop   05/27/23 2030  cefTRIAXone (ROCEPHIN) 2 g in sodium  chloride 0.9 % 100 mL IVPB       See Hyperspace for full Linked Orders Report.   2 g 200 mL/hr over 30 Minutes Intravenous Every 24 hours 05/27/23 2029 05/30/23 2052   05/27/23 2030  metroNIDAZOLE (FLAGYL) IVPB 500 mg       See Hyperspace for full Linked Orders Report.   500 mg 100 mL/hr over 60 Minutes Intravenous Every 12 hours 05/27/23 2029 05/31/23 0858         Subjective:  Patient seen and examined.  Continues to have pain on his right stump however slightly better today.  He is very apprehensive about moving.  Encouraged movements.  Encouraged oral pain medicine intake.  Objective: Vitals:   06/02/23 0000 06/02/23 0128 06/02/23 0755 06/02/23 0814  BP:  (!) 165/86 (!) 157/84   Pulse:  93 93   Resp:   18   Temp:  (!) 97.5 F (36.4 C) 99.1 F (37.3 C)   TempSrc:  Oral    SpO2:  100% 98% 97%  Weight: 70.6 kg     Height:        Intake/Output Summary (Last 24 hours) at 06/02/2023 1313 Last data filed at 06/02/2023 1252 Gross per 24 hour  Intake 120 ml  Output 550 ml  Net -430 ml    Filed Weights   05/30/23 0500 05/31/23 0521 06/02/23 0000  Weight: 69.6 kg 66.9 kg 70.6 kg    Examination:  Physical Exam Constitutional:      Comments: Slightly anxious and apprehensive to mobilize.  Eyes:     Pupils: Pupils are equal, round, and reactive to light.  Cardiovascular:     Rate and Rhythm: Normal rate and regular rhythm.     Pulses: Normal pulses.  Pulmonary:     Effort: Pulmonary effort is normal.     Breath sounds: Normal breath sounds.  Abdominal:     General: Abdomen is flat. Bowel sounds are normal.     Palpations: Abdomen is soft.  Musculoskeletal:     Cervical back: Normal range of motion.     Comments: Right below-knee amputation, stump is clean.  Staples intact.  Neurological:     Mental Status: He is alert.     Anxious.  Mildly distressed with pain.   Data Reviewed: I have personally reviewed following labs and imaging studies  CBC: Recent Labs   Lab 05/27/23 1518 05/28/23 0108 05/31/23 0535  WBC 8.0 9.3 9.1  NEUTROABS 5.1  --  6.7  HGB 12.1* 12.4* 9.8*  HCT 33.5* 34.2* 27.1*  MCV 75.5* 77.2* 75.5*  PLT 268 260 240    Basic Metabolic Panel: Recent Labs  Lab 05/27/23 1518 05/28/23 0108 05/31/23 0535  NA 132* 135 132*  K 3.4* 3.5 4.0  CL 102 106 105  CO2 15* 16* 18*  GLUCOSE 174* 198* 212*  BUN 25* 23 26*  CREATININE 1.73* 1.68* 1.40*  CALCIUM 9.8 9.8 9.0  MG  --   --  1.5*    GFR: Estimated Creatinine Clearance: 53.2 mL/min (A) (by C-G formula based on SCr of 1.4 mg/dL (H)). Liver Function Tests: Recent Labs  Lab 05/27/23 1518  AST 24  ALT 31  ALKPHOS 72  BILITOT 0.7  PROT 6.7  ALBUMIN 3.3*    No results for input(s): "LIPASE", "AMYLASE" in the last 168 hours. No results for input(s): "AMMONIA" in the last 168 hours. Coagulation Profile: No results for input(s): "INR", "PROTIME" in the last 168 hours. Cardiac Enzymes: No results for input(s): "CKTOTAL", "CKMB", "CKMBINDEX", "TROPONINI" in the last 168 hours. BNP (last 3 results) No results for input(s): "PROBNP" in the last 8760 hours. HbA1C: No results for input(s): "HGBA1C" in the last 72 hours. CBG: Recent Labs  Lab 06/01/23 1132 06/01/23 1551 06/01/23 2051 06/02/23 0755 06/02/23 1249  GLUCAP 287* 141* 93 175* 258*    Lipid Profile: No results for input(s): "CHOL", "HDL", "LDLCALC", "TRIG", "CHOLHDL", "LDLDIRECT" in the last 72 hours. Thyroid Function Tests: No results for input(s): "TSH", "T4TOTAL", "FREET4", "T3FREE", "THYROIDAB" in the last 72 hours. Anemia Panel: No results for input(s): "VITAMINB12", "FOLATE", "FERRITIN", "TIBC", "IRON", "RETICCTPCT" in the last 72 hours. Sepsis Labs: Recent Labs  Lab 05/27/23 1518  LATICACIDVEN 1.8     Recent Results (from the past 240 hour(s))  Blood Cultures x 2 sites     Status: None   Collection Time: 05/27/23 10:25 PM   Specimen: BLOOD  Result Value Ref Range Status   Specimen  Description BLOOD SITE NOT SPECIFIED  Final   Special Requests   Final    BOTTLES DRAWN AEROBIC AND ANAEROBIC Blood Culture adequate volume   Culture   Final    NO GROWTH 5 DAYS Performed at [facility], [address], [place], [place] [address]    Report Status 06/01/2023 FINAL  Final  Blood Cultures x 2 sites     Status: None   Collection Time: 05/28/23  1:08 AM   Specimen: BLOOD LEFT HAND  Result Value Ref Range Status   Specimen Description BLOOD LEFT HAND  Final   Special Requests   Final    BOTTLES DRAWN AEROBIC AND ANAEROBIC Blood Culture adequate volume   Culture   Final    NO GROWTH 5 DAYS  Performed at [facility], [address] [place], [place] [address]    Report Status 06/02/2023 FINAL  Final         Radiology Studies: No results found.      Scheduled Meds:  amitriptyline  25 mg Oral QHS   amLODipine  10 mg Oral Daily   aspirin EC  81 mg Oral Daily   atorvastatin  80 mg Oral QPM   carvedilol  12.5 mg Oral BID WC   Chlorhexidine Gluconate Cloth  6 each Topical Q0600   gabapentin  100 mg Oral TID   heparin  5,000 Units Subcutaneous Q8H   insulin aspart  0-5 Units Subcutaneous QHS   insulin aspart  0-9 Units Subcutaneous TID WC   insulin glargine-yfgn  30 Units Subcutaneous Daily   magnesium oxide  400 mg Oral BID   mometasone-formoterol  2 puff Inhalation BID   oxyCODONE  10 mg Oral Q12H   senna  1 tablet Oral BID   Continuous Infusions:     LOS: 6 days    Time spent: 35 minutes    Dorcas Carrow, MD [facility]

## 2023-06-02 NOTE — Progress Notes (Signed)
Physical Therapy Treatment Patient Details Name: Jose [name] MRN: [id number] DOB: [birth year] Today's Date: 06/02/2023   History of Present Illness Pt is 62 y.o. 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 PMH - CAD, CKD, HTN, DM, COPD    PT Comments  Pt greeted resting in bed and agreeable to session with encouragement. Pt continues to be limited by residual limb and phantom limb pain, limiting pt activity tolerance. Pt able to come to sitting EOB at supervision level and transfer to stand with RW for support and min A to steady on rise. Pt with fair balance in standing and able to maintain ~30 seconds before needing to sit. Educated pt on importance of continued mobility and time up OOB with pt agreeable to time sitting up EOB at end of session and reinforced pain management strategies with pt verbalizing understanding. Current plan remains appropriate to address deficits and maximize functional independence and decrease caregiver burden. Pt continues to benefit from skilled PT services to progress toward functional mobility goals.      Assistance Recommended at Discharge Intermittent Supervision/Assistance  If plan is discharge home, recommend the following:  Can travel by private vehicle    A lot of help with walking and/or transfers;A little help with bathing/dressing/bathroom;Assistance with cooking/housework;Assist for transportation;Help with stairs or ramp for entrance      Equipment Recommendations  Wheelchair (measurements PT);Wheelchair cushion (measurements PT)    Recommendations for Other Services       Precautions / Restrictions Precautions Precautions: Fall Required Braces or Orthoses: Other Brace Other Brace: limb protector Restrictions Weight Bearing Restrictions: Yes RLE Weight Bearing: Non weight bearing     Mobility  Bed Mobility Overal bed mobility: Needs Assistance Bed  Mobility: Supine to Sit, Sit to Supine     Supine to sit: Supervision, HOB elevated     General bed mobility comments: use of bed rails    Transfers Overall transfer level: Needs assistance Equipment used: 1 person hand held assist Transfers: Sit to/from Stand, Bed to chair/wheelchair/BSC Sit to Stand: Min assist           General transfer comment: min A to steady on rise, good hand placement    Ambulation/Gait                   Stairs             Wheelchair Mobility     Tilt Bed    Modified Rankin (Stroke Patients Only)       Balance Overall balance assessment: Needs assistance Sitting-balance support: No upper extremity supported, Feet supported Sitting balance-Leahy Scale: Good Sitting balance - Comments: sitting EOB   Standing balance support: Single extremity supported, Bilateral upper extremity supported Standing balance-Leahy Scale: Poor Standing balance comment: Reliant on UE support and up to modA to recover during LOB bouts.                            Cognition Arousal/Alertness: Awake/alert Behavior During Therapy: WFL for tasks assessed/performed Overall Cognitive Status: Within Functional Limits for tasks assessed                                 General Comments: Pt refusing OOB activity secondary to pain, agreeable to EOB transfer  Exercises General Exercises - Lower Extremity Quad Sets: Strengthening, 10 reps, Right, Supine Straight Leg Raises: Strengthening, 5 reps, Supine, Right    General Comments General comments (skin integrity, edema, etc.): VSS on RA, R limb protector donned during session      Pertinent Vitals/Pain Pain Assessment Pain Assessment: Faces Faces Pain Scale: Hurts whole lot Pain Location: R residual limb Pain Descriptors / Indicators: Discomfort, Grimacing, Guarding, Operative site guarding Pain Intervention(s): Patient requesting pain meds-RN notified, Monitored  during session, Limited activity within patient's tolerance, Repositioned    Home Living                          Prior Function            PT Goals (current goals can now be found in the care plan section) Acute Rehab PT Goals Patient Stated Goal: to improve and go to rehab PT Goal Formulation: With patient Time For Goal Achievement: 06/14/23 Progress towards PT goals: Progressing toward goals    Frequency    Min 3X/week      PT Plan Current plan remains appropriate    Co-evaluation              AM-PAC PT "6 Clicks" Mobility   Outcome Measure  Help needed turning from your back to your side while in a flat bed without using bedrails?: A Little Help needed moving from lying on your back to sitting on the side of a flat bed without using bedrails?: A Little Help needed moving to and from a bed to a chair (including a wheelchair)?: A Lot Help needed standing up from a chair using your arms (e.g., wheelchair or bedside chair)?: A Little Help needed to walk in hospital room?: Total Help needed climbing 3-5 steps with a railing? : Total 6 Click Score: 13    End of Session Equipment Utilized During Treatment: Other (comment) (limb protector) Activity Tolerance: Patient tolerated treatment well Patient left: in bed;with call bell/phone within reach (seated EOB) Nurse Communication: Mobility status;Patient requests pain meds PT Visit Diagnosis: Unsteadiness on feet (R26.81);Other abnormalities of gait and mobility (R26.89);Muscle weakness (generalized) (M62.81);Difficulty in walking, not elsewhere classified (R26.2);Pain Pain - Right/Left: Right Pain - part of body: Leg     Time: 1227-1239 PT Time Calculation (min) (ACUTE ONLY): 12 min  Charges:    $Therapeutic Activity: 8-22 mins PT General Charges $$ ACUTE PT VISIT: 1 Visit                     Kandance Yano R. PTA Acute Rehabilitation Services Office: [phone]   Catalina Antigua 06/02/2023, 1:55 PM

## 2023-06-02 NOTE — Progress Notes (Addendum)
  Progress Note    06/02/2023 6:47 AM 3 Days Post-Op  Subjective:  says he had cramping in the stump yesterday and it is better today.    Afebrile  Vitals:   06/01/23 2018 06/02/23 0128  BP: (!) 158/84 (!) 165/86  Pulse: 81 93  Resp: 16   Temp: 98.3 F (36.8 C) (!) 97.5 F (36.4 C)  SpO2: 100% 100%    Physical Exam: Incisions:  bandage clean and in tact.   CBC    Component Value Date/Time   WBC 9.1 05/31/2023 0535   RBC 3.59 (L) 05/31/2023 0535   HGB 9.8 (L) 05/31/2023 0535   HCT 27.1 (L) 05/31/2023 0535   PLT 240 05/31/2023 0535   MCV 75.5 (L) 05/31/2023 0535   MCH 27.3 05/31/2023 0535   MCHC 36.2 (H) 05/31/2023 0535   RDW 13.2 05/31/2023 0535   LYMPHSABS 1.8 05/31/2023 0535   MONOABS 0.6 05/31/2023 0535   EOSABS 0.0 05/31/2023 0535   BASOSABS 0.0 05/31/2023 0535    BMET    Component Value Date/Time   NA 132 (L) 05/31/2023 0535   NA 131 (L) 12/14/2020 0849   K 4.0 05/31/2023 0535   CL 105 05/31/2023 0535   CO2 18 (L) 05/31/2023 0535   GLUCOSE 212 (H) 05/31/2023 0535   BUN 26 (H) 05/31/2023 0535   BUN 26 12/14/2020 0849   CREATININE 1.40 (H) 05/31/2023 0535   CALCIUM 9.0 05/31/2023 0535   GFRNONAA 56 (L) 05/31/2023 0535   GFRAA 53 (L) 12/14/2020 0849    INR    Component Value Date/Time   INR 1.1 01/27/2022 1856     Intake/Output Summary (Last 24 hours) at 06/02/2023 0647 Last data filed at 06/02/2023 0100 Gross per 24 hour  Intake 240 ml  Output 1200 ml  Net -960 ml     Assessment/Plan:  62 y.o. male is s/p right below knee amputation   3 Days Post-Op   -bandage is clean.  Will take it down tomorrow as pt states it was changed a couple of times yesterday. -pain a little bit better today.  Discussed with pt that pain should start to get better with every day.  Discussed that he could not go to rehab while receiving IV pain medication.  Discussed that po meds last longer.  I will change the duration of the dilaudid to q3h instead of  q1h. -continue PT/OT   Doreatha Massed, PA-C [facility] [phone] 06/02/2023 6:47 AM   I have independently interviewed and examined patient and agree with PA assessment and plan above.  Soft appears viable with some oozing at the incision site.  I encouraged PT and hopeful for rehab.  He is okay for discharge from vascular standpoint.  Lyfe Reihl C. Randie Heinz, MD [facility] [place] Office: [phone] Pager: [phone]

## 2023-06-02 NOTE — TOC Progression Note (Signed)
Transition of Care [facility]) - Progression Note    Patient Details  Name: Jose [name] MRN: [id number] Date of Birth: July 11, 1961  Transition of Care [facility]) CM/SW Contact  Flo Berroa A [place], [place] Phone Number: 06/02/2023, 4:29 PM  Clinical Narrative:    CSW met with pt at bedside. Pt's receommendation changed to SNF at discharge. CSW informed pt of SNF process regarding obtaining SNF approval through the [place] and attending SNF at a [place] approved facility. Pt stated he understood and would be in communication with his niece who is to assist pt with process and possibly coming in person this week while pt is admitted.   TOC will continue to follow.    Expected Discharge Plan: IP Rehab Facility Barriers to Discharge: Continued Medical Work up  Expected Discharge Plan and Services   Discharge Planning Services: CM Consult Post Acute Care Choice: IP Rehab Living arrangements for the past 2 months: Apartment                                       Social Determinants of Health (SDOH) Interventions SDOH Screenings   Food Insecurity: No Food Insecurity (05/28/2023)  Housing: Low Risk  (05/28/2023)  Transportation Needs: No Transportation Needs (05/28/2023)  Utilities: Not At Risk (05/28/2023)  Depression (PHQ2-9): High Risk (08/11/2022)  Tobacco Use: Medium Risk (06/01/2023)    Readmission Risk Interventions    05/29/2023    2:21 PM 04/29/2023    1:09 PM 01/29/2022    2:46 PM  Readmission Risk Prevention Plan  Transportation Screening Complete Complete Complete  PCP or Specialist Appt within 3-5 Days   Complete  HRI or Home Care Consult   Complete  Social Work Consult for Recovery Care Planning/Counseling   Complete  Palliative Care Screening   Not Applicable  Medication Review Oceanographer) Complete Complete Complete  PCP or Specialist appointment within 3-5 days of discharge Complete    HRI or Home Care Consult Complete Complete   SW Recovery Care/Counseling Consult Complete  Complete   Palliative Care Screening Not Applicable Not Applicable   Skilled Nursing Facility Complete Not Applicable

## 2023-06-02 NOTE — Progress Notes (Signed)
Inpatient Rehab Admissions Coordinator:   Called niece again and left message for return call. Continuing to follow for potential CIR admission.   Rehab Admissons Coordinator [place], [place], [place] [phone]

## 2023-06-02 NOTE — Progress Notes (Signed)
Inpatient Rehab Admissions Coordinator:   Spoke with patient at bedside and niece, Tilman Neat over speaker phone. Niece states she lives near [place] and will not be able to provide 24/7 support to patient. Patient and niece are agreeable to look for SNF placement for patient then home. Will sign off at this time and turn over to [facility].   Rehab Admissons Coordinator [place], [place], [place] [phone]

## 2023-06-02 NOTE — Progress Notes (Incomplete)
Initial Nutrition Assessment  DOCUMENTATION CODES:   Severe malnutrition in context of chronic illness  INTERVENTION:  - Add Glucerna Shake po TID, each supplement provides 220 kcal and 10 grams of protein  - Add Renal MVI.   - Add -1 packet Juven BID, each packet provides 95 calories, 2.5 grams of protein (collagen), and 9.8 grams of carbohydrate (3 grams sugar); also contains 7 grams of L-arginine and L-glutamine, 300 mg vitamin C, 15 mg vitamin E, 1.2 mcg vitamin B-12, 9.5 mg zinc, 200 mg calcium, and 1.5 g  Calcium Beta-hydroxy-Beta-methylbutyrate to support wound healing  NUTRITION DIAGNOSIS:   Severe Malnutrition related to acute illness as evidenced by energy intake < or equal to 50% for > or equal to 5 days, percent weight loss.  GOAL:   Patient will meet greater than or equal to 90% of their needs  MONITOR:   PO intake  REASON FOR ASSESSMENT:   Malnutrition Screening Tool    ASSESSMENT:   62 y.o. male admits related to pain at right transmetatarsal amputation site. PMH includes: polysubstance abuse, PVD, CAD s/p stents, stage 3 CKD, COPD, DM, HTN, HLD. Pt is currently receiving medical management related to nonhealing surgical wound.  Meds reviewed: lipitor, sliding scale insulin, semglee, mag-ox, senokot. Labs reviewed: Na low, BUN/Creatinine elevated, mag low.   Pt rpeorts that he has not had a great appetite for the past 3 weeks. He denies any nausea and reports just a loss of appetite. Pt states that he would be open to trying Glucerna shakes with meals. Pt has experienced a 14% wt loss in the past month, which is significant.   RD will closely monitor PO intakes and supplement intakes over the next 48 hrs. If no improvement, Pt may benefit from Cortrak tube. RD will inquire with MD about appetite stimulant.   NUTRITION - FOCUSED PHYSICAL EXAM:  WDL - no wasting noted.  Diet Order:   Diet Order             Diet regular Room service appropriate? Yes; Fluid  consistency: Thin  Diet effective now                   EDUCATION NEEDS:   Not appropriate for education at this time  Skin:  Skin Integrity Issues:: Incisions Incisions: R leg  Last BM:  7/9  Height:   Ht Readings from Last 1 Encounters:  05/27/23 5\' 11"  (1.803 m)    Weight:   Wt Readings from Last 1 Encounters:  06/03/23 69.4 kg    Ideal Body Weight:     BMI:  Body mass index is 21.34 kg/m.  Estimated Nutritional Needs:   Kcal:  2100-2500 kcals  Protein:  105-125 gm  Fluid:  2.1-2.5 L  Bethann Humble, RD, LDN, CNSC.

## 2023-06-03 DIAGNOSIS — E43 Unspecified severe protein-calorie malnutrition: Secondary | ICD-10-CM

## 2023-06-03 DIAGNOSIS — T8189XA Other complications of procedures, not elsewhere classified, initial encounter: Secondary | ICD-10-CM | POA: Diagnosis not present

## 2023-06-03 LAB — GLUCOSE, CAPILLARY
Glucose-Capillary: 206 mg/dL — ABNORMAL HIGH (ref 70–99)
Glucose-Capillary: 284 mg/dL — ABNORMAL HIGH (ref 70–99)
Glucose-Capillary: 307 mg/dL — ABNORMAL HIGH (ref 70–99)
Glucose-Capillary: 159 mg/dL — ABNORMAL HIGH (ref 70–99)

## 2023-06-03 MED ORDER — OXYCODONE-ACETAMINOPHEN 5-325 MG PO TABS
2.0000 | ORAL_TABLET | ORAL | Status: DC | PRN
  Administered 2023-06-03 (×2): 2 via ORAL
  Filled 2023-06-03 (×2): qty 2

## 2023-06-03 MED ORDER — RENA-VITE PO TABS
1.0000 | ORAL_TABLET | Freq: Every day | ORAL | Status: DC
  Administered 2023-06-03 – 2023-06-11 (×9): 1 via ORAL
  Filled 2023-06-03 (×9): qty 1

## 2023-06-03 MED ORDER — JUVEN PO PACK
1.0000 | PACK | Freq: Two times a day (BID) | ORAL | Status: DC
  Administered 2023-06-03 – 2023-06-11 (×10): 1 via ORAL
  Filled 2023-06-03 (×12): qty 1

## 2023-06-03 MED ORDER — GABAPENTIN 100 MG PO CAPS
200.0000 mg | ORAL_CAPSULE | Freq: Three times a day (TID) | ORAL | Status: DC
  Administered 2023-06-03 – 2023-06-04 (×4): 200 mg via ORAL
  Filled 2023-06-03 (×4): qty 2

## 2023-06-03 NOTE — Progress Notes (Addendum)
  Progress Note    06/03/2023 8:35 AM 4 Days Post-Op  Subjective:  says he feels better since the doctor came by and changed the dressing.  Says he will work with therapy after he has had pain medication.  Tm 100.3  Vitals:   06/02/23 2050 06/03/23 0527  BP:  (!) 140/85  Pulse:  93  Resp:  18  Temp:  100.3 F (37.9 C)  SpO2: 100% 99%    Physical Exam: Incisions:  dressing changed this morning by MD - upon my arrival, dressing is clean and in tact.    CBC    Component Value Date/Time   WBC 9.1 05/31/2023 0535   RBC 3.59 (L) 05/31/2023 0535   HGB 9.8 (L) 05/31/2023 0535   HCT 27.1 (L) 05/31/2023 0535   PLT 240 05/31/2023 0535   MCV 75.5 (L) 05/31/2023 0535   MCH 27.3 05/31/2023 0535   MCHC 36.2 (H) 05/31/2023 0535   RDW 13.2 05/31/2023 0535   LYMPHSABS 1.8 05/31/2023 0535   MONOABS 0.6 05/31/2023 0535   EOSABS 0.0 05/31/2023 0535   BASOSABS 0.0 05/31/2023 0535    BMET    Component Value Date/Time   NA 132 (L) 05/31/2023 0535   NA 131 (L) 12/14/2020 0849   K 4.0 05/31/2023 0535   CL 105 05/31/2023 0535   CO2 18 (L) 05/31/2023 0535   GLUCOSE 212 (H) 05/31/2023 0535   BUN 26 (H) 05/31/2023 0535   BUN 26 12/14/2020 0849   CREATININE 1.40 (H) 05/31/2023 0535   CALCIUM 9.0 05/31/2023 0535   GFRNONAA 56 (L) 05/31/2023 0535   GFRAA 53 (L) 12/14/2020 0849    INR    Component Value Date/Time   INR 1.1 01/27/2022 1856     Intake/Output Summary (Last 24 hours) at 06/03/2023 0835 Last data filed at 06/02/2023 2208 Gross per 24 hour  Intake --  Output 550 ml  Net -550 ml     Assessment/Plan:  62 y.o. male is s/p right below knee amputation   4 Days Post-Op   -pt dressing is clean and in tact and was changed earlier this am -continue PT/OT.   -pt to follow up in Vascular office in 4 weeks for staple removal.   -ok for soap and water to incision daily and pat dry.    Doreatha Massed, PA-C [facility] [phone] 06/03/2023 8:35  AM   I have independently interviewed and patient and agree with PA assessment and plan above.  Okay for discharge from vascular standpoint.  Daniyla Pfahler C. Randie Heinz, MD [facility] [place] Office: [phone] Pager: [phone]

## 2023-06-03 NOTE — Progress Notes (Signed)
PROGRESS NOTE    Jose [name]  UXL:[id number] DOB: [birth year] DOA: 05/27/2023 PCP: [name]    Brief Narrative:  62 year old with history of coronary artery disease status post PCI, COPD, CKD stage IIIb, type 2 diabetes, hypertension hyperlipidemia, significant peripheral artery disease status post transmetatarsal amputation on 05/12/2023 presented with increasing pain and tissue loss at the TMA site.  Seen at vascular office and sent to admit for more proximal amputation. Underwent right BKA, significant phantom leg pain and surgical site pain. Waiting for pain relief and transfer to SNF.   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  Has significant surgical pain today along with phantom limb. Pain control was challenging but now improving. Percocet 10 mg every 4 hours as needed, increasing dose today. Gabapentin 200 mg 3 times daily Amitriptyline 25 mg daily. Use IV opiates for severe pain, will try to manage pain with oral pain medications.  Hypomagnesemia: Replaced with improvement.  Type 2 diabetes with hyperglycemia: Appropriately controlled on current regimen.  Chronic medical issues including CKD stage IIIb, renal function stable at baseline. Essential hypertension, on amlodipine and Coreg. Coronary artery disease, stable on aspirin, Plavix, atorvastatin and Coreg. COPD, without exacerbation.  Stable. Hyperlipidemia, on atorvastatin.  Stable.  Nutrition Status: Nutrition Problem: Severe Malnutrition Etiology: acute illness Signs/Symptoms: energy intake < or equal to 50% for > or equal to 5 days, percent weight loss Percent weight loss: 14 % (in 1 month) Interventions: MVI, Glucerna shake     DVT prophylaxis: heparin injection 5,000 Units Start: 05/31/23 0600   Code Status: Full code Family Communication: None at bedside Disposition Plan: Status is:  Inpatient Remains inpatient appropriate because: Pain control postop.   Consultants:  Vascular  Procedures:  Right below-knee amputation 7/6  Antimicrobials:  Anti-infectives (From admission, onward)    Start     Dose/Rate Route Frequency Ordered Stop   05/27/23 2030  cefTRIAXone (ROCEPHIN) 2 g in sodium chloride 0.9 % 100 mL IVPB       See Hyperspace for full Linked Orders Report.   2 g 200 mL/hr over 30 Minutes Intravenous Every 24 hours 05/27/23 2029 05/30/23 2052   05/27/23 2030  metroNIDAZOLE (FLAGYL) IVPB 500 mg       See Hyperspace for full Linked Orders Report.   500 mg 100 mL/hr over 60 Minutes Intravenous Every 12 hours 05/27/23 2029 05/31/23 0858         Subjective:  Patient seen and examined.  Very apprehensive and appropriately anxious about mobility and pain management.  He is agreeable to try higher dose of pain medications by mouth today to avoid injectables.  He is looking forward to go to SNF if his pain is relieved.  Bowel movements are regulated.  Objective: Vitals:   06/02/23 2004 06/02/23 2050 06/03/23 0527 06/03/23 0850  BP: 120/73  (!) 140/85 136/82  Pulse: 76  93 (!) 106  Resp: 15  18 16   Temp: 99 F (37.2 C)  100.3 F (37.9 C) 98.1 F (36.7 C)  TempSrc:    Oral  SpO2: 100% 100% 99% 98%  Weight:   69.4 kg   Height:        Intake/Output Summary (Last 24 hours) at 06/03/2023 1133 Last data filed at 06/03/2023 0932 Gross per 24 hour  Intake 300 ml  Output 750 ml  Net -450 ml    Filed Weights   05/31/23 0521  06/02/23 0000 06/03/23 0527  Weight: 66.9 kg 70.6 kg 69.4 kg    Examination:  Physical Exam Constitutional:      Appearance: Normal appearance.     Comments: Alert awake and oriented.  Mild distress due to pain and anxiety.  Looks comfortable otherwise.  Eyes:     Extraocular Movements: Extraocular movements intact.     Pupils: Pupils are equal, round, and reactive to light.  Cardiovascular:     Rate and Rhythm: Normal rate  and regular rhythm.     Pulses: Normal pulses.  Pulmonary:     Effort: Pulmonary effort is normal.     Breath sounds: Normal breath sounds.  Abdominal:     General: Bowel sounds are normal.     Palpations: Abdomen is soft.  Musculoskeletal:     Comments: Right below-knee amputation the stump clean and dry.  Staples intact.     Anxious.  Mildly distressed with pain.   Data Reviewed: I have personally reviewed following labs and imaging studies  CBC: Recent Labs  Lab 05/27/23 1518 05/28/23 0108 05/31/23 0535  WBC 8.0 9.3 9.1  NEUTROABS 5.1  --  6.7  HGB 12.1* 12.4* 9.8*  HCT 33.5* 34.2* 27.1*  MCV 75.5* 77.2* 75.5*  PLT 268 260 240    Basic Metabolic Panel: Recent Labs  Lab 05/27/23 1518 05/28/23 0108 05/31/23 0535  NA 132* 135 132*  K 3.4* 3.5 4.0  CL 102 106 105  CO2 15* 16* 18*  GLUCOSE 174* 198* 212*  BUN 25* 23 26*  CREATININE 1.73* 1.68* 1.40*  CALCIUM 9.8 9.8 9.0  MG  --   --  1.5*    GFR: Estimated Creatinine Clearance: 52.3 mL/min (A) (by C-G formula based on SCr of 1.4 mg/dL (H)). Liver Function Tests: Recent Labs  Lab 05/27/23 1518  AST 24  ALT 31  ALKPHOS 72  BILITOT 0.7  PROT 6.7  ALBUMIN 3.3*    No results for input(s): "LIPASE", "AMYLASE" in the last 168 hours. No results for input(s): "AMMONIA" in the last 168 hours. Coagulation Profile: No results for input(s): "INR", "PROTIME" in the last 168 hours. Cardiac Enzymes: No results for input(s): "CKTOTAL", "CKMB", "CKMBINDEX", "TROPONINI" in the last 168 hours. BNP (last 3 results) No results for input(s): "PROBNP" in the last 8760 hours. HbA1C: No results for input(s): "HGBA1C" in the last 72 hours. CBG: Recent Labs  Lab 06/02/23 0755 06/02/23 1249 06/02/23 1601 06/02/23 2205 06/03/23 0727  GLUCAP 175* 258* 195* 102* 206*    Lipid Profile: No results for input(s): "CHOL", "HDL", "LDLCALC", "TRIG", "CHOLHDL", "LDLDIRECT" in the last 72 hours. Thyroid Function Tests: No  results for input(s): "TSH", "T4TOTAL", "FREET4", "T3FREE", "THYROIDAB" in the last 72 hours. Anemia Panel: No results for input(s): "VITAMINB12", "FOLATE", "FERRITIN", "TIBC", "IRON", "RETICCTPCT" in the last 72 hours. Sepsis Labs: Recent Labs  Lab 05/27/23 1518  LATICACIDVEN 1.8     Recent Results (from the past 240 hour(s))  Blood Cultures x 2 sites     Status: None   Collection Time: 05/27/23 10:25 PM   Specimen: BLOOD  Result Value Ref Range Status   Specimen Description BLOOD SITE NOT SPECIFIED  Final   Special Requests   Final    BOTTLES DRAWN AEROBIC AND ANAEROBIC Blood Culture adequate volume   Culture   Final    NO GROWTH 5 DAYS Performed at [facility], [address] [place], [place] [address]    Report Status 06/01/2023 FINAL  Final  Blood Cultures x 2 sites     Status: None   Collection Time: 05/28/23  1:08 AM   Specimen: BLOOD LEFT HAND  Result Value Ref Range Status   Specimen Description BLOOD LEFT HAND  Final   Special Requests   Final    BOTTLES DRAWN AEROBIC AND ANAEROBIC Blood Culture adequate volume   Culture   Final    NO GROWTH 5 DAYS Performed at [facility], [address], [place], [place] [address]    Report Status 06/02/2023 FINAL  Final         Radiology Studies: No results found.      Scheduled Meds:  amitriptyline  25 mg Oral QHS   amLODipine  10 mg Oral Daily   aspirin EC  81 mg Oral Daily   atorvastatin  80 mg Oral QPM   carvedilol  12.5 mg Oral BID WC   Chlorhexidine Gluconate Cloth  6 each Topical Q0600   feeding supplement (GLUCERNA SHAKE)  237 mL Oral TID BM   gabapentin  200 mg Oral TID   heparin  5,000 Units Subcutaneous Q8H   insulin aspart  0-5 Units Subcutaneous QHS   insulin aspart  0-9 Units Subcutaneous TID WC   insulin glargine-yfgn  30 Units Subcutaneous Daily   magnesium oxide  400 mg Oral BID   mometasone-formoterol  2 puff Inhalation BID   multivitamin  1 tablet Oral QHS   nutrition  supplement (JUVEN)  1 packet Oral BID BM   senna  1 tablet Oral BID   Continuous Infusions:     LOS: 7 days    Time spent: 35 minutes    Dorcas Carrow, MD [facility]

## 2023-06-03 NOTE — TOC Progression Note (Signed)
Transition of Care [facility]) - Progression Note    Patient Details  Name: Jose [name] MRN: [id number] Date of Birth: 08/02/1961  Transition of Care [facility]) CM/SW Contact  Jaydrien Wassenaar A [place], [place] Phone Number: 06/03/2023, 3:18 PM  Clinical Narrative:     CSW made attempt to complete PASSR to facilitate workup with pt. [employer] website was down and could not be accessed. CSW will make attempt at another time to complete and fax out pt for SNF.  TOC will continue to follow.  Expected Discharge Plan: IP Rehab Facility Barriers to Discharge: Continued Medical Work up  Expected Discharge Plan and Services   Discharge Planning Services: CM Consult Post Acute Care Choice: IP Rehab Living arrangements for the past 2 months: Apartment                                       Social Determinants of Health (SDOH) Interventions SDOH Screenings   Food Insecurity: No Food Insecurity (05/28/2023)  Housing: Low Risk  (05/28/2023)  Transportation Needs: No Transportation Needs (05/28/2023)  Utilities: Not At Risk (05/28/2023)  Depression (PHQ2-9): High Risk (08/11/2022)  Tobacco Use: Medium Risk (06/01/2023)    Readmission Risk Interventions    05/29/2023    2:21 PM 04/29/2023    1:09 PM 01/29/2022    2:46 PM  Readmission Risk Prevention Plan  Transportation Screening Complete Complete Complete  PCP or Specialist Appt within 3-5 Days   Complete  HRI or Home Care Consult   Complete  Social Work Consult for Recovery Care Planning/Counseling   Complete  Palliative Care Screening   Not Applicable  Medication Review Oceanographer) Complete Complete Complete  PCP or Specialist appointment within 3-5 days of discharge Complete    HRI or Home Care Consult Complete Complete   SW Recovery Care/Counseling Consult Complete Complete   Palliative Care Screening Not Applicable Not Applicable   Skilled Nursing Facility Complete Not Applicable

## 2023-06-03 NOTE — Progress Notes (Signed)
Name: Jose [name] DOB: 1961/04/22  Please be advised that the above-named patient will require a short-term nursing home stay -- anticipated 30 days or less for rehabilitation and strengthening. The plan is for return home.

## 2023-06-03 NOTE — Progress Notes (Signed)
Occupational Therapy Treatment Patient Details Name: Jose [name] MRN: [id number] DOB: 06/28/1961 Today's Date: 06/03/2023   History of present illness Pt is 62 y.o. 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 PMH - CAD, CKD, HTN, DM, COPD   OT comments  Pt demonstrating fair progress towards therapy goals. Pt hesitant to EOB/OOB activity upon arrival, agreeable with encouragement and education on importance of mobility. Pt presents this session with generalized weakness and right residual limb pain. Pt re-educated on desensitization techniques for phantom limb pain with pt verbalizing/demonstrating understanding. Pt with limb protector off upon arrival, education on wear schedule provided. Pt able to don limb protector with min cues this session. Pt progressed with OOB transfers this session and completed x2 squat pivot transfers from EOB<>drop arm recliner. Pt would benefit from continued acute OT services to maximize functional independence and facilitate transition to skilled inpatient follow up therapy, <3 hours/day.    Recommendations for follow up therapy are one component of a multi-disciplinary discharge planning process, led by the attending physician.  Recommendations may be updated based on patient status, additional functional criteria and insurance authorization.    Assistance Recommended at Discharge Frequent or constant Supervision/Assistance  Patient can return home with the following  A little help with walking and/or transfers;A little help with bathing/dressing/bathroom;Assistance with cooking/housework;Assist for transportation;Help with stairs or ramp for entrance   Equipment Recommendations  Other (comment) (defer)    Recommendations for Other Services      Precautions / Restrictions Precautions Precautions: Fall Precaution Comments: R BKA Required Braces or Orthoses: Other Brace Other  Brace: limb protector Restrictions Weight Bearing Restrictions: Yes RLE Weight Bearing: Non weight bearing       Mobility Bed Mobility Overal bed mobility: Needs Assistance Bed Mobility: Supine to Sit, Sit to Supine     Supine to sit: Supervision, HOB elevated Sit to supine: Supervision, HOB elevated   General bed mobility comments: use of bed rails    Transfers Overall transfer level: Needs assistance Equipment used: 1 person hand held assist Transfers: Bed to chair/wheelchair/BSC     Squat pivot transfers: Min guard       General transfer comment: squat pivot transfer from EOB<>drop arm recliner with min guard A     Balance Overall balance assessment: Needs assistance Sitting-balance support: No upper extremity supported, Feet supported Sitting balance-Leahy Scale: Good Sitting balance - Comments: sitting EOB                                   ADL either performed or assessed with clinical judgement   ADL Overall ADL's : Needs assistance/impaired                     Lower Body Dressing: Set up;Bed level Lower Body Dressing Details (indicate cue type and reason): donned limb protector while long sitting in bed, min cues for technique Toilet Transfer: Min Garment/textile technologist Details (indicate cue type and reason): simulated         Functional mobility during ADLs: Min guard (squat pivot) General ADL Comments: Limited secondary to R residual limb pain    Extremity/Trunk Assessment Upper Extremity Assessment Upper Extremity Assessment: Overall WFL for tasks assessed   Lower Extremity Assessment Lower Extremity Assessment: Defer to PT evaluation        Vision  Vision Assessment?: No apparent visual deficits   Perception Perception Perception: Not tested   Praxis Praxis Praxis: Not tested    Cognition Arousal/Alertness: Awake/alert Behavior During Therapy: WFL for tasks assessed/performed Overall  Cognitive Status: Within Functional Limits for tasks assessed                                 General Comments: Initially refusing OOB activity, agreeable with education and encouragement        Exercises      Shoulder Instructions       General Comments VSS on RA, limb protector donned during session    Pertinent Vitals/ Pain       Pain Assessment Pain Assessment: Faces Faces Pain Scale: Hurts little more Pain Location: R residual limb Pain Descriptors / Indicators: Discomfort, Grimacing, Guarding, Operative site guarding Pain Intervention(s): Limited activity within patient's tolerance, Monitored during session  Home Living                                          Prior Functioning/Environment              Frequency  Min 2X/week        Progress Toward Goals  OT Goals(current goals can now be found in the care plan section)  Progress towards OT goals: Progressing toward goals  Acute Rehab OT Goals Patient Stated Goal: to get back to bed OT Goal Formulation: With patient Time For Goal Achievement: 06/15/23 Potential to Achieve Goals: Good ADL Goals Pt Will Perform Grooming: with supervision;standing Pt Will Perform Lower Body Bathing: with modified independence;sitting/lateral leans Pt Will Perform Upper Body Dressing: with modified independence;sitting Pt Will Perform Lower Body Dressing: with modified independence;sitting/lateral leans Pt Will Transfer to Toilet: stand pivot transfer;with modified independence;bedside commode Pt Will Perform Toileting - Clothing Manipulation and hygiene: with modified independence;sitting/lateral leans Pt Will Perform Tub/Shower Transfer: with supervision;tub bench;shower seat;ambulating  Plan Frequency remains appropriate;Discharge plan needs to be updated    Co-evaluation                 AM-PAC OT "6 Clicks" Daily Activity     Outcome Measure   Help from another person eating  meals?: None Help from another person taking care of personal grooming?: A Little Help from another person toileting, which includes using toliet, bedpan, or urinal?: A Little Help from another person bathing (including washing, rinsing, drying)?: A Little Help from another person to put on and taking off regular upper body clothing?: A Little Help from another person to put on and taking off regular lower body clothing?: A Little 6 Click Score: 19    End of Session Equipment Utilized During Treatment: Other (comment) (none)  OT Visit Diagnosis: Unsteadiness on feet (R26.81);Pain;Muscle weakness (generalized) (M62.81)   Activity Tolerance No increased pain   Patient Left in bed;with call bell/phone within reach   Nurse Communication Mobility status        Time: 1520-1540 OT Time Calculation (min): 20 min  Charges: OT General Charges $OT Visit: 1 Visit OT Treatments $Therapeutic Activity: 8-22 mins  Sherley Bounds, OTS Acute Rehabilitation Services Office [phone] Secure Chat Communication Preferred   Sherley Bounds 06/03/2023, 4:26 PM

## 2023-06-03 NOTE — TOC Progression Note (Signed)
Transition of Care [facility]) - Progression Note    Patient Details  Name: Jose [name] MRN: [id number] Date of Birth: [birth year]  Transition of Care [facility]) CM/SW Contact  Librado Guandique A [place], [place] Phone Number: 06/03/2023, 3:02 PM  Clinical Narrative:     CSW received contact from pt's niece, Ella, [place] , [phone] regarding SNF recommendation and process.  She stated pt was very confused and that she would be the point of contact to help assist pt with process.   CSW was informed that pt had [employer] Medicare from nursing case management and informed family that CSW would attempt to do SNF workup through pt's medicare so that could expedite discharge to a facility.   She stated that pt would be interested in facilities closer to pt's family, in the [place], [place], [place] and [place] area. CSW was given preference for [facility] facility in [place].   CSW provided contact information so as question arise, pt's family can reach out to CSW.   TOC will continue to follow.   Expected Discharge Plan: IP Rehab Facility Barriers to Discharge: Continued Medical Work up  Expected Discharge Plan and Services   Discharge Planning Services: CM Consult Post Acute Care Choice: IP Rehab Living arrangements for the past 2 months: Apartment                                       Social Determinants of Health (SDOH) Interventions SDOH Screenings   Food Insecurity: No Food Insecurity (05/28/2023)  Housing: Low Risk  (05/28/2023)  Transportation Needs: No Transportation Needs (05/28/2023)  Utilities: Not At Risk (05/28/2023)  Depression (PHQ2-9): High Risk (08/11/2022)  Tobacco Use: Medium Risk (06/01/2023)    Readmission Risk Interventions    05/29/2023    2:21 PM 04/29/2023    1:09 PM 01/29/2022    2:46 PM  Readmission Risk Prevention Plan  Transportation Screening Complete Complete Complete  PCP or Specialist Appt within 3-5 Days   Complete  HRI or Home Care Consult    Complete  Social Work Consult for Recovery Care Planning/Counseling   Complete  Palliative Care Screening   Not Applicable  Medication Review Oceanographer) Complete Complete Complete  PCP or Specialist appointment within 3-5 days of discharge Complete    HRI or Home Care Consult Complete Complete   SW Recovery Care/Counseling Consult Complete Complete   Palliative Care Screening Not Applicable Not Applicable   Skilled Nursing Facility Complete Not Applicable

## 2023-06-03 NOTE — [place] FL2 (Addendum)
[place] MEDICAID FL2 LEVEL OF CARE FORM     IDENTIFICATION  Patient Name: Jose [name] Birthdate: January 26, 1961 Sex: male Admission Date (Current Location): 05/27/2023  [place] and [place] Number:  Haynes Bast [id number] [facility] Facility and Address:  [facility] [place], [address], [place], [place] [address]      Provider Number: [id number]  Attending Physician Name and Address:  Dorcas Carrow, MD  Relative Name and Phone Number:  Abishai, [place] (Niece) [phone]    Current Level of Care: Hospital Recommended Level of Care: Skilled Nursing Facility Prior Approval Number:    Date Approved/Denied: 06/04/2023 07/04/2023 (expiration date) PASRR Number:  [id number]  Discharge Plan: SNF    Current Diagnoses: Patient Active Problem List   Diagnosis Date Noted   Protein-calorie malnutrition, severe 06/03/2023   Nonhealing surgical wound, initial encounter 05/27/2023   Metabolic acidosis 05/11/2023   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) 05/09/2023   Pancreatic mass 05/09/2023   Constipation 05/09/2023   Glaucoma 05/09/2023   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) 04/24/2023   Gangrene of right foot (HCC) 04/24/2023   PTSD (post-traumatic stress disorder) 08/14/2022   Nightmares associated with chronic post-traumatic stress disorder 08/14/2022   Major depressive disorder, 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/11/2022   Suicidal ideation 08/11/2022   Abnormality of aortic valve 01/29/2022   Bradycardia 01/29/2022   Hypokalemia 01/29/2022   Unstable angina (HCC) 01/27/2022   Hypertensive urgency 02/11/2021   Chest pain 02/11/2021   NSTEMI (non-ST elevated myocardial infarction) (HCC) 08/22/2020   Acute coronary syndrome (HCC) 08/21/2020   Hypertensive emergency 08/21/2020   Depression    Elevated troponin    Nausea and vomiting    Suspected COVID-19 virus infection    Diarrhea 04/27/2019   Hypertensive  retinopathy of both eyes 02/01/2019   Chest pain with moderate risk for cardiac etiology 08/30/2017   Chest pain with high risk for cardiac etiology 08/28/2017   Chronic kidney disease, stage 3b (HCC) 08/28/2017   COPD (chronic obstructive pulmonary disease) (HCC) 08/28/2017   CAD (coronary artery disease) 04/10/2016   Hyperlipidemia 04/10/2016   Benign prostatic hyperplasia 04/10/2016   Diabetes (HCC) 04/10/2016   Hyperglycemia 10/17/2015   Type 2 diabetes mellitus with complication, with long-term current use of insulin (HCC) 10/17/2015   Hyperkalemia 10/17/2015   Dyslipidemia associated with type 2 diabetes mellitus (HCC) 10/17/2015   Alcohol abuse    Cocaine abuse (HCC) 06/24/2015   Hyponatremia 05/12/2015   Hypertension associated with diabetes (HCC)     Orientation RESPIRATION BLADDER Height & Weight     Self, Time, Situation, Place  Normal Continent Weight: 153 lb (69.4 kg) Height:  5\' 11"  (180.3 cm)  BEHAVIORAL SYMPTOMS/MOOD NEUROLOGICAL BOWEL NUTRITION STATUS      Continent Diet (see discharge summary)  AMBULATORY STATUS COMMUNICATION OF NEEDS Skin   Extensive Assist Verbally Surgical wounds (closed incision, right leg)                       Personal Care Assistance Level of Assistance  Bathing, Feeding, Dressing Bathing Assistance: Limited assistance Feeding assistance: Independent Dressing Assistance: Limited assistance     Functional Limitations Info  Hearing, Sight, Speech Sight Info: Impaired (wears glasses) Hearing Info: Impaired Speech Info: Adequate    SPECIAL CARE FACTORS FREQUENCY  PT (By licensed PT), OT (By licensed OT)     PT Frequency: 5x/week OT Frequency: 5x/week            Contractures Contractures  Info: Not present    Additional Factors Info  Code Status, Allergies, Insulin Sliding Scale Code Status Info: FULL Allergies Info: Lisinopril-hydrochlorothiazide  Simvastatin   Insulin Sliding Scale Info: see discharge summary        Current Medications (06/03/2023):  This is the current hospital active medication list Current Facility-Administered Medications  Medication Dose Route Frequency Provider Last Rate Last Admin   acetaminophen (TYLENOL) tablet 650 mg  650 mg Oral Q6H PRN Schuh, McKenzi P, PA-C   650 mg at 06/02/23 0932   Or   acetaminophen (TYLENOL) suppository 650 mg  650 mg Rectal Q6H PRN Schuh, McKenzi P, PA-C       albuterol (PROVENTIL) (2.5 MG/3ML) 0.083% nebulizer solution 3 mL  3 mL Inhalation Q6H PRN Schuh, McKenzi P, PA-C       amitriptyline (ELAVIL) tablet 25 mg  25 mg Oral QHS Schuh, McKenzi P, PA-C   25 mg at 06/02/23 2150   amLODipine (NORVASC) tablet 10 mg  10 mg Oral Daily Schuh, McKenzi P, PA-C   10 mg at 06/03/23 0855   aspirin EC tablet 81 mg  81 mg Oral Daily Schuh, McKenzi P, PA-C   81 mg at 06/03/23 0856   atorvastatin (LIPITOR) tablet 80 mg  80 mg Oral QPM Schuh, McKenzi P, PA-C   80 mg at 06/02/23 1749   bisacodyl (DULCOLAX) EC tablet 5 mg  5 mg Oral Daily PRN Schuh, McKenzi P, PA-C       carvedilol (COREG) tablet 12.5 mg  12.5 mg Oral BID WC Schuh, McKenzi P, PA-C   12.5 mg at 06/03/23 0856   Chlorhexidine Gluconate Cloth 2 % PADS 6 each  6 each Topical Q0600 Schuh, McKenzi P, PA-C   6 each at 05/30/23 0600   feeding supplement (GLUCERNA SHAKE) (GLUCERNA SHAKE) liquid 237 mL  237 mL Oral TID BM Dorcas Carrow, MD   237 mL at 06/03/23 0857   gabapentin (NEURONTIN) capsule 200 mg  200 mg Oral TID Dorcas Carrow, MD   200 mg at 06/03/23 0853   heparin injection 5,000 Units  5,000 Units Subcutaneous Q8H Schuh, McKenzi P, PA-C   5,000 Units at 06/03/23 0522   HYDROmorphone (DILAUDID) injection 1 mg  1 mg Intravenous Q2H PRN Rhyne, Samantha J, PA-C   1 mg at 06/03/23 1117   hydrOXYzine (ATARAX) tablet 25 mg  25 mg Oral TID PRN [place]da [place], McKenzi P, PA-C   25 mg at 06/02/23 0932   insulin aspart (novoLOG) injection 0-5 Units  0-5 Units Subcutaneous QHS Schuh, McKenzi P, PA-C   2 Units at 05/31/23  2149   insulin aspart (novoLOG) injection 0-9 Units  0-9 Units Subcutaneous TID WC Schuh, McKenzi P, PA-C   3 Units at 06/03/23 0858   insulin glargine-yfgn (SEMGLEE) injection 30 Units  30 Units Subcutaneous Daily Dorcas Carrow, MD   30 Units at 06/03/23 0857   ipratropium-albuterol (DUONEB) 0.5-2.5 (3) MG/3ML nebulizer solution 3 mL  3 mL Nebulization Q6H PRN Schuh, McKenzi P, PA-C       magnesium oxide (MAG-OX) tablet 400 mg  400 mg Oral BID Dorcas Carrow, MD   400 mg at 06/03/23 0854   mometasone-formoterol (DULERA) 200-5 MCG/ACT inhaler 2 puff  2 puff Inhalation BID Schuh, McKenzi P, PA-C   2 puff at 06/03/23 5621   multivitamin (RENA-VIT) tablet 1 tablet  1 tablet Oral QHS Dorcas Carrow, MD       nutrition supplement (JUVEN) (JUVEN) powder packet 1 packet  1 packet  Oral BID BM Dorcas Carrow, MD       ondansetron (ZOFRAN) tablet 4 mg  4 mg Oral Q6H PRN Schuh, McKenzi P, PA-C       Or   ondansetron (ZOFRAN) injection 4 mg  4 mg Intravenous Q6H PRN Schuh, McKenzi P, PA-C       oxyCODONE-acetaminophen (PERCOCET/ROXICET) 5-325 MG per tablet 2 tablet  2 tablet Oral Q4H PRN Dorcas Carrow, MD       polyethylene glycol (MIRALAX / GLYCOLAX) packet 17 g  17 g Oral Daily PRN Schuh, McKenzi P, PA-C       senna (SENOKOT) tablet 8.6 mg  1 tablet Oral BID Schuh, McKenzi P, PA-C   8.6 mg at 06/03/23 0855   simethicone (MYLICON) 40 MG/0.6ML suspension 40 mg  40 mg Oral Q6H PRN Schuh, McKenzi P, PA-C         Discharge Medications: Please see discharge summary for a list of discharge medications.  Relevant Imaging Results:  Relevant Lab Results:   Additional Information [id number]  Theodus Ran A [place], [place]

## 2023-06-04 DIAGNOSIS — T8189XA Other complications of procedures, not elsewhere classified, initial encounter: Secondary | ICD-10-CM | POA: Diagnosis not present

## 2023-06-04 LAB — GLUCOSE, CAPILLARY
Glucose-Capillary: 269 mg/dL — ABNORMAL HIGH (ref 70–99)
Glucose-Capillary: 343 mg/dL — ABNORMAL HIGH (ref 70–99)
Glucose-Capillary: 145 mg/dL — ABNORMAL HIGH (ref 70–99)

## 2023-06-04 MED ORDER — OXYCODONE-ACETAMINOPHEN 5-325 MG PO TABS
1.0000 | ORAL_TABLET | ORAL | Status: DC | PRN
  Administered 2023-06-04 – 2023-06-06 (×9): 2 via ORAL
  Administered 2023-06-07: 1 via ORAL
  Administered 2023-06-08 (×2): 2 via ORAL
  Administered 2023-06-08: 1 via ORAL
  Filled 2023-06-04 (×2): qty 2
  Filled 2023-06-04: qty 1
  Filled 2023-06-04: qty 2
  Filled 2023-06-04: qty 1
  Filled 2023-06-04 (×8): qty 2

## 2023-06-04 MED ORDER — IBUPROFEN 200 MG PO TABS
600.0000 mg | ORAL_TABLET | Freq: Once | ORAL | Status: AC
  Administered 2023-06-04: 600 mg via ORAL
  Filled 2023-06-04: qty 3

## 2023-06-04 MED ORDER — HYDROMORPHONE HCL 1 MG/ML IJ SOLN
0.5000 mg | Freq: Four times a day (QID) | INTRAMUSCULAR | Status: DC | PRN
  Administered 2023-06-04 – 2023-06-06 (×5): 0.5 mg via INTRAVENOUS
  Filled 2023-06-04 (×8): qty 0.5

## 2023-06-04 MED ORDER — GABAPENTIN 100 MG PO CAPS
100.0000 mg | ORAL_CAPSULE | Freq: Three times a day (TID) | ORAL | Status: DC
  Administered 2023-06-04 – 2023-06-11 (×21): 100 mg via ORAL
  Filled 2023-06-04 (×22): qty 1

## 2023-06-04 NOTE — TOC Progression Note (Signed)
Transition of Care [facility]) - Progression Note    Patient Details  Name: Jose [name] MRN: [id number] Date of Birth: 25-Dec-1960  Transition of Care [facility]) CM/SW Contact  Jomes Giraldo A [place], [place] Phone Number: 06/04/2023, 12:25 PM  Clinical Narrative:     CSW contacted Alex at [facility] in [place], [place]. She said that the niece had contacted her and she was aware of pt. She provided fax and contact number,  [phone], fax- [phone]. CSW to send referral information to facility and get updates regarding pending acceptance.   TOC will continue to follow.    Expected Discharge Plan: IP Rehab Facility Barriers to Discharge: Continued Medical Work up  Expected Discharge Plan and Services   Discharge Planning Services: CM Consult Post Acute Care Choice: IP Rehab Living arrangements for the past 2 months: Apartment                                       Social Determinants of Health (SDOH) Interventions SDOH Screenings   Food Insecurity: No Food Insecurity (05/28/2023)  Housing: Low Risk  (05/28/2023)  Transportation Needs: No Transportation Needs (05/28/2023)  Utilities: Not At Risk (05/28/2023)  Depression (PHQ2-9): High Risk (08/11/2022)  Social Connections: Unknown (03/05/2023)   Received from [facility]  Tobacco Use: Medium Risk (06/01/2023)    Readmission Risk Interventions    05/29/2023    2:21 PM 04/29/2023    1:09 PM 01/29/2022    2:46 PM  Readmission Risk Prevention Plan  Transportation Screening Complete Complete Complete  PCP or Specialist Appt within 3-5 Days   Complete  HRI or Home Care Consult   Complete  Social Work Consult for Recovery Care Planning/Counseling   Complete  Palliative Care Screening   Not Applicable  Medication Review Oceanographer) Complete Complete Complete  PCP or Specialist appointment within 3-5 days of discharge Complete    HRI or Home Care Consult Complete Complete   SW Recovery Care/Counseling Consult Complete Complete    Palliative Care Screening Not Applicable Not Applicable   Skilled Nursing Facility Complete Not Applicable

## 2023-06-04 NOTE — TOC Progression Note (Signed)
Transition of Care [facility]) - Progression Note    Patient Details  Name: Jose [name] MRN: [id number] Date of Birth: 1961-01-16  Transition of Care [facility]) CM/SW Contact  Isaiha Asare A [place], [place] Phone Number: 06/04/2023, 3:56 PM  Clinical Narrative:     CSW faxed out referral to [facility]. CSW contacted their facility 2x to confirm fax. There was no answer. CSW was unable to leave voicemail. CSW will follow up at another opportune time.   TOC will continue to follow.    Expected Discharge Plan: IP Rehab Facility Barriers to Discharge: Continued Medical Work up  Expected Discharge Plan and Services   Discharge Planning Services: CM Consult Post Acute Care Choice: IP Rehab Living arrangements for the past 2 months: Apartment                                       Social Determinants of Health (SDOH) Interventions SDOH Screenings   Food Insecurity: No Food Insecurity (05/28/2023)  Housing: Low Risk  (05/28/2023)  Transportation Needs: No Transportation Needs (05/28/2023)  Utilities: Not At Risk (05/28/2023)  Depression (PHQ2-9): High Risk (08/11/2022)  Social Connections: Unknown (03/05/2023)   Received from [facility]  Tobacco Use: Medium Risk (06/01/2023)    Readmission Risk Interventions    05/29/2023    2:21 PM 04/29/2023    1:09 PM 01/29/2022    2:46 PM  Readmission Risk Prevention Plan  Transportation Screening Complete Complete Complete  PCP or Specialist Appt within 3-5 Days   Complete  HRI or Home Care Consult   Complete  Social Work Consult for Recovery Care Planning/Counseling   Complete  Palliative Care Screening   Not Applicable  Medication Review Oceanographer) Complete Complete Complete  PCP or Specialist appointment within 3-5 days of discharge Complete    HRI or Home Care Consult Complete Complete   SW Recovery Care/Counseling Consult Complete Complete   Palliative Care Screening Not Applicable Not Applicable   Skilled Nursing Facility  Complete Not Applicable

## 2023-06-04 NOTE — TOC Progression Note (Signed)
Transition of Care [facility]) - Progression Note    Patient Details  Name: Mildred Bollard MRN: [id number] Date of Birth: 1961-10-26  Transition of Care [facility]) CM/SW Contact  Milarose Savich A [place], [place] Phone Number: 06/04/2023, 12:08 PM  Clinical Narrative:     CSW contacted pt's niece over the phone while at bedside with pt to have disposition to have family conference call regarding plan. CSW clarified that pt was declined at [facility] due to not having 24/7 support from family members at home. He said he was disappointed and felt like that did not make sense but understood that that option for rehab was no longer available.   He said that he was now interested in the facility that his niece found, [facility], that was close to her home in [place] in an area he was familiar with. He said that he would be ok with CSW faxing referral to facility. CSW was given information for Director, [place], [phone], fax- [phone].   Both pt and nurse mentioned that pt, from a previous admission was to get a rolling walker weith seat and that it was not provided to his room at discharge and was never delivered to his home.   CSW was given contact information to Nurse Annice Pih, at the [place] who assists pt with equipment to inquire about equipment and getting to facility or possibly still home.   TOC will continue to follow.   Expected Discharge Plan: IP Rehab Facility Barriers to Discharge: Continued Medical Work up  Expected Discharge Plan and Services   Discharge Planning Services: CM Consult Post Acute Care Choice: IP Rehab Living arrangements for the past 2 months: Apartment                                       Social Determinants of Health (SDOH) Interventions SDOH Screenings   Food Insecurity: No Food Insecurity (05/28/2023)  Housing: Low Risk  (05/28/2023)  Transportation Needs: No Transportation Needs (05/28/2023)  Utilities: Not At Risk (05/28/2023)  Depression (PHQ2-9): High Risk  (08/11/2022)  Social Connections: Unknown (03/05/2023)   Received from [facility]  Tobacco Use: Medium Risk (06/01/2023)    Readmission Risk Interventions    05/29/2023    2:21 PM 04/29/2023    1:09 PM 01/29/2022    2:46 PM  Readmission Risk Prevention Plan  Transportation Screening Complete Complete Complete  PCP or Specialist Appt within 3-5 Days   Complete  HRI or Home Care Consult   Complete  Social Work Consult for Recovery Care Planning/Counseling   Complete  Palliative Care Screening   Not Applicable  Medication Review Oceanographer) Complete Complete Complete  PCP or Specialist appointment within 3-5 days of discharge Complete    HRI or Home Care Consult Complete Complete   SW Recovery Care/Counseling Consult Complete Complete   Palliative Care Screening Not Applicable Not Applicable   Skilled Nursing Facility Complete Not Applicable

## 2023-06-04 NOTE — Plan of Care (Signed)
Patient is resting in bed with normal vitals and respirations. All meds have been given. Pain has been addressed and currently managing. Plan of care is ongoing.  Problem: Education: Goal: Understanding of CV disease, CV risk reduction, and recovery process will improve Outcome: Progressing Goal: Individualized Educational Video(s) Outcome: Progressing   Problem: Activity: Goal: Ability to return to baseline activity level will improve Outcome: Progressing   Problem: Cardiovascular: Goal: Ability to achieve and maintain adequate cardiovascular perfusion will improve Outcome: Progressing Goal: Vascular access site(s) Level 0-1 will be maintained Outcome: Progressing   Problem: Health Behavior/Discharge Planning: Goal: Ability to safely manage health-related needs after discharge will improve Outcome: Progressing   Problem: Education: Goal: Ability to describe self-care measures that may prevent or decrease complications (Diabetes Survival Skills Education) will improve Outcome: Progressing Goal: Individualized Educational Video(s) Outcome: Progressing   Problem: Coping: Goal: Ability to adjust to condition or change in health will improve Outcome: Progressing   Problem: Fluid Volume: Goal: Ability to maintain a balanced intake and output will improve Outcome: Progressing   Problem: Health Behavior/Discharge Planning: Goal: Ability to identify and utilize available resources and services will improve Outcome: Progressing Goal: Ability to manage health-related needs will improve Outcome: Progressing   Problem: Metabolic: Goal: Ability to maintain appropriate glucose levels will improve Outcome: Progressing   Problem: Nutritional: Goal: Maintenance of adequate nutrition will improve Outcome: Progressing Goal: Progress toward achieving an optimal weight will improve Outcome: Progressing   Problem: Skin Integrity: Goal: Risk for impaired skin integrity will  decrease Outcome: Progressing   Problem: Tissue Perfusion: Goal: Adequacy of tissue perfusion will improve Outcome: Progressing   Problem: Education: Goal: Knowledge of General Education information will improve Description: Including pain rating scale, medication(s)/side effects and non-pharmacologic comfort measures Outcome: Progressing   Problem: Health Behavior/Discharge Planning: Goal: Ability to manage health-related needs will improve Outcome: Progressing   Problem: Clinical Measurements: Goal: Ability to maintain clinical measurements within normal limits will improve Outcome: Progressing Goal: Will remain free from infection Outcome: Progressing Goal: Diagnostic test results will improve Outcome: Progressing Goal: Respiratory complications will improve Outcome: Progressing Goal: Cardiovascular complication will be avoided Outcome: Progressing   Problem: Activity: Goal: Risk for activity intolerance will decrease Outcome: Progressing   Problem: Nutrition: Goal: Adequate nutrition will be maintained Outcome: Progressing   Problem: Coping: Goal: Level of anxiety will decrease Outcome: Progressing   Problem: Elimination: Goal: Will not experience complications related to bowel motility Outcome: Progressing Goal: Will not experience complications related to urinary retention Outcome: Progressing   Problem: Pain Managment: Goal: General experience of comfort will improve Outcome: Progressing   Problem: Safety: Goal: Ability to remain free from injury will improve Outcome: Progressing   Problem: Skin Integrity: Goal: Risk for impaired skin integrity will decrease Outcome: Progressing

## 2023-06-04 NOTE — Progress Notes (Signed)
PROGRESS NOTE    Jose [name]  ZOX:[id number] DOB: 1961-03-26 DOA: 05/27/2023 PCP: [name]    Brief Narrative:  62 year old with history of coronary artery disease status post PCI, COPD, CKD stage IIIb, type 2 diabetes, hypertension hyperlipidemia, significant peripheral artery disease status post transmetatarsal amputation on 05/12/2023 presented with increasing pain and tissue loss at the TMA site.  Seen at vascular office and sent to admit for more proximal amputation. Underwent right BKA, significant phantom leg pain and surgical site pain. Waiting for transfer to SNF.   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  Has significant surgical pain today along with phantom limb. Pain control was challenging but now improving. Patient is getting slightly impulsive, likely due to too much narcotics. Discontinue injectable Dilaudid. Decrease dose of Percocet 5 mg every 4 hours. Decrease dose of gabapentin to 100 mg 3 times daily. Continue amitriptyline 25 mg daily.  Hypomagnesemia: Replaced with improvement.  Type 2 diabetes with hyperglycemia: Appropriately controlled on current regimen.  Chronic medical issues including CKD stage IIIb, renal function stable at baseline. Essential hypertension, on amlodipine and Coreg. Coronary artery disease, stable on aspirin, Plavix, atorvastatin and Coreg. COPD, without exacerbation.  Stable. Hyperlipidemia, on atorvastatin.  Stable.  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  He was repeatedly asking same questions again again today.  He had some confusion regarding rehab in a skilled nursing facility.  Patient is slightly apprehensive about moving around, however he has been using both oral medications and injectable Dilaudid for pain control.  He is insisting on [place] suggesting [facility]. We discussed about a skilled nursing facility rehab for longer duration.  Objective: Vitals:   06/04/23 0439 06/04/23 0724 06/04/23 0745 06/04/23 0922  BP: 138/74 117/72  130/78  Pulse: 78 75  75  Resp: 18   16  Temp: 98.6 F (37 C)     TempSrc:      SpO2: 98% 97% 98%   Weight:      Height:        Intake/Output Summary (Last 24 hours) at 06/04/2023 1116 Last data filed at 06/03/2023 1537 Gross per 24 hour  Intake 250 ml  Output --  Net 250 ml   Ceasar Mons  Weights   05/31/23 0521 06/02/23 0000 06/03/23 0527  Weight: 66.9 kg 70.6 kg 69.4 kg    Examination:    General: Patient looks comfortable but slightly anxious.  He is alert awake and oriented but impulsive and repeatedly asking questions. Cardiovascular: S1-S2 normal.  Regular rate rhythm. Respiratory: Bilateral clear.  No added sounds. Gastrointestinal: Soft.  Nontender.  Bowel sound  present. Ext: No swelling or edema.  Right below-knee amputation the stump clean and dry.  Staples intact.    Data Reviewed: I have personally reviewed following labs and imaging studies  CBC: Recent Labs  Lab 05/31/23 0535  WBC 9.1  NEUTROABS 6.7  HGB 9.8*  HCT 27.1*  MCV 75.5*  PLT 240   Basic Metabolic Panel: Recent Labs  Lab 05/31/23 0535  NA 132*  K 4.0  CL 105  CO2 18*  GLUCOSE 212*  BUN 26*  CREATININE 1.40*  CALCIUM 9.0  MG 1.5*   GFR: Estimated Creatinine Clearance: 52.3 mL/min (A) (by C-G formula based on SCr of 1.4 mg/dL (H)). Liver Function Tests: No results for input(s): "AST", "ALT", "ALKPHOS", "BILITOT", "PROT", "ALBUMIN" in the last 168 hours. No results for input(s): "LIPASE", "AMYLASE" in the last 168 hours. No results for input(s): "AMMONIA" in the last 168 hours. Coagulation Profile: No results for input(s): "INR", "PROTIME" in the last 168 hours. Cardiac Enzymes: No results for input(s): "CKTOTAL", "CKMB", "CKMBINDEX", "TROPONINI" in the last 168 hours. BNP (last 3 results) No results for input(s): "PROBNP" in the last 8760 hours. HbA1C: No results for input(s): "HGBA1C" in the last 72 hours. CBG: Recent Labs  Lab 06/03/23 0727 06/03/23 1243 06/03/23 1524 06/03/23 1951 06/04/23 0811  GLUCAP 206* 284* 307* 159* 269*   Lipid Profile: No results for input(s): "CHOL", "HDL", "LDLCALC", "TRIG", "CHOLHDL", "LDLDIRECT" in the last 72 hours. Thyroid Function Tests: No results for input(s): "TSH", "T4TOTAL", "FREET4", "T3FREE", "THYROIDAB" in the last 72 hours. Anemia Panel: No results for input(s): "VITAMINB12", "FOLATE", "FERRITIN", "TIBC", "IRON", "RETICCTPCT" in the last 72 hours. Sepsis Labs: No results for input(s): "PROCALCITON", "LATICACIDVEN" in the last 168 hours.  Recent Results (from the past 240 hour(s))  Blood Cultures x 2 sites     Status: None   Collection Time: 05/27/23 10:25 PM   Specimen: BLOOD  Result Value Ref Range  Status   Specimen Description BLOOD SITE NOT SPECIFIED  Final   Special Requests   Final    BOTTLES DRAWN AEROBIC AND ANAEROBIC Blood Culture adequate volume   Culture   Final    NO GROWTH 5 DAYS Performed at [facility], [address], [place], [place] [address]    Report Status 06/01/2023 FINAL  Final  Blood Cultures x 2 sites     Status: None   Collection Time: 05/28/23  1:08 AM   Specimen: BLOOD LEFT HAND  Result Value Ref Range Status   Specimen Description BLOOD LEFT HAND  Final   Special Requests   Final    BOTTLES DRAWN AEROBIC AND ANAEROBIC Blood Culture adequate volume   Culture   Final    NO GROWTH 5 DAYS Performed at [facility], [address], [place], [place] [address]    Report Status 06/02/2023 FINAL  Final         Radiology Studies: No results found.      Scheduled Meds:  amitriptyline  25 mg Oral QHS   amLODipine  10 mg Oral Daily   aspirin EC  81 mg  Oral Daily   atorvastatin  80 mg Oral QPM   carvedilol  12.5 mg Oral BID WC   Chlorhexidine Gluconate Cloth  6 each Topical Q0600   feeding supplement (GLUCERNA SHAKE)  237 mL Oral TID BM   gabapentin  100 mg Oral TID   heparin  5,000 Units Subcutaneous Q8H   insulin aspart  0-5 Units Subcutaneous QHS   insulin aspart  0-9 Units Subcutaneous TID WC   insulin glargine-yfgn  30 Units Subcutaneous Daily   magnesium oxide  400 mg Oral BID   mometasone-formoterol  2 puff Inhalation BID   multivitamin  1 tablet Oral QHS   nutrition supplement (JUVEN)  1 packet Oral BID BM   senna  1 tablet Oral BID   Continuous Infusions:     LOS: 8 days    Time spent: 35 minutes    Dorcas Carrow, MD [facility]

## 2023-06-04 NOTE — Progress Notes (Addendum)
PT Cancellation Note  Patient Details Name: Jose [name] MRN: [id number] DOB: 1961/08/26   Cancelled Treatment:    Reason Eval/Treat Not Completed: (P) Pain limiting ability to participate;Other (comment), pt declining all mobility stating he is cold and needs to rest and in too much pain, despite pre-medication per RN. Will check back as schedule allows to continue with PT POC.  3:24 pm - Pt declining on seconds attempt due to continued pain. Will follow up tomorrow.    Lenora Boys. PTA Acute Rehabilitation Services Office: [phone]     Catalina Antigua 06/04/2023, 9:52 AM

## 2023-06-05 DIAGNOSIS — T8189XA Other complications of procedures, not elsewhere classified, initial encounter: Secondary | ICD-10-CM | POA: Diagnosis not present

## 2023-06-05 LAB — GLUCOSE, CAPILLARY
Glucose-Capillary: 165 mg/dL — ABNORMAL HIGH (ref 70–99)
Glucose-Capillary: 265 mg/dL — ABNORMAL HIGH (ref 70–99)
Glucose-Capillary: 193 mg/dL — ABNORMAL HIGH (ref 70–99)
Glucose-Capillary: 136 mg/dL — ABNORMAL HIGH (ref 70–99)

## 2023-06-05 MED ORDER — SODIUM CHLORIDE 0.9% FLUSH: 10.0000 mL | INTRAVENOUS | Status: DC | PRN

## 2023-06-05 MED ORDER — SODIUM CHLORIDE 0.9% FLUSH
10.0000 mL | Freq: Two times a day (BID) | INTRAVENOUS | Status: DC
  Administered 2023-06-05 – 2023-06-11 (×14): 10 mL

## 2023-06-05 MED ORDER — BRIMONIDINE TARTRATE 0.2 % OP SOLN
1.0000 [drp] | Freq: Three times a day (TID) | OPHTHALMIC | Status: DC
  Administered 2023-06-05 – 2023-06-11 (×15): 1 [drp] via OPHTHALMIC
  Filled 2023-06-05: qty 5

## 2023-06-05 MED ORDER — BRINZOLAMIDE 1 % OP SUSP
1.0000 [drp] | Freq: Three times a day (TID) | OPHTHALMIC | Status: DC
  Administered 2023-06-05 – 2023-06-11 (×15): 1 [drp] via OPHTHALMIC
  Filled 2023-06-05: qty 10

## 2023-06-05 MED ORDER — LATANOPROST 0.005 % OP SOLN
1.0000 [drp] | Freq: Every day | OPHTHALMIC | Status: DC
  Administered 2023-06-06 – 2023-06-10 (×4): 1 [drp] via OPHTHALMIC
  Filled 2023-06-05: qty 2.5

## 2023-06-05 NOTE — TOC Progression Note (Signed)
Transition of Care [facility]) - Progression Note    Patient Details  Name: Jose [name] MRN: [id number] Date of Birth: 11-03-1961  Transition of Care [facility]) CM/SW Contact  Desarai Barrack A [place], [place] Phone Number: 06/05/2023, 3:16 PM  Clinical Narrative:     CSW contacted [facility] regarding referral. CSW was unable to reach Alex at the provided number, [phone]. CSW reached out to facility, [phone]  and left contact information with staff who stated they would leave a message for Trinna Post with CSW provided contact information, [phone].   TOC will continue to follow.   Expected Discharge Plan: IP Rehab Facility Barriers to Discharge: Continued Medical Work up  Expected Discharge Plan and Services   Discharge Planning Services: CM Consult Post Acute Care Choice: IP Rehab Living arrangements for the past 2 months: Apartment                                       Social Determinants of Health (SDOH) Interventions SDOH Screenings   Food Insecurity: No Food Insecurity (05/28/2023)  Housing: Low Risk  (05/28/2023)  Transportation Needs: No Transportation Needs (05/28/2023)  Utilities: Not At Risk (05/28/2023)  Depression (PHQ2-9): High Risk (08/11/2022)  Social Connections: Unknown (03/05/2023)   Received from [facility]  Tobacco Use: Medium Risk (05/30/2023)    Readmission Risk Interventions    05/29/2023    2:21 PM 04/29/2023    1:09 PM 01/29/2022    2:46 PM  Readmission Risk Prevention Plan  Transportation Screening Complete Complete Complete  PCP or Specialist Appt within 3-5 Days   Complete  HRI or Home Care Consult   Complete  Social Work Consult for Recovery Care Planning/Counseling   Complete  Palliative Care Screening   Not Applicable  Medication Review [employer]) Complete Complete Complete  PCP or Specialist appointment within 3-5 days of discharge Complete    HRI or Home Care Consult Complete Complete   SW Recovery Care/Counseling Consult Complete  Complete   Palliative Care Screening Not Applicable Not Applicable   Skilled Nursing Facility Complete Not Applicable

## 2023-06-05 NOTE — Progress Notes (Signed)
Physical Therapy Treatment Patient Details Name: Jose [name] MRN: [id number] DOB: 04-Jul-1961 Today's Date: 06/05/2023   History of Present Illness Pt is 62 y.o. 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 PMH - CAD, CKD, HTN, DM, COPD    PT Comments  Pt with good progress towards goals this session, pleasant and participatory despite continued pain increasing with RLE in dependent position.  Pt requiring min A to steady on rise to RW and min guard for safety to hop-pivot to<>from BSC. Pt needing cues throughout transfers for optimal hand placement and RW management. Pt able to hop along EOB toward HOB x3 with good LE clearance each step. Continued education on importance of time up OOB, positioning to facilitate R knee extension and HEP exercises with pt able to demonstrate and verbalize understanding. Pt continues to benefit from skilled PT services to progress toward functional mobility goals.      Assistance Recommended at Discharge Intermittent Supervision/Assistance  If plan is discharge home, recommend the following:  Can travel by private vehicle    A lot of help with walking and/or transfers;A little help with bathing/dressing/bathroom;Assistance with cooking/housework;Assist for transportation;Help with stairs or ramp for entrance      Equipment Recommendations  Wheelchair (measurements PT);Wheelchair cushion (measurements PT)    Recommendations for Other Services       Precautions / Restrictions Precautions Precautions: Fall Precaution Comments: R BKA Required Braces or Orthoses: Other Brace Other Brace: limb protector Restrictions Weight Bearing Restrictions: Yes RLE Weight Bearing: Non weight bearing     Mobility  Bed Mobility Overal bed mobility: Needs Assistance Bed Mobility: Supine to Sit, Sit to Supine     Supine to sit: Supervision, HOB elevated Sit to supine: Supervision, HOB  elevated   General bed mobility comments: use of bed rails    Transfers Overall transfer level: Needs assistance Equipment used: Rolling walker (2 wheels) Transfers: Bed to chair/wheelchair/BSC, Sit to/from Stand Sit to Stand: Min assist     Squat pivot transfers: Min guard     General transfer comment: pt able to power up without assist from EOB, min A needed to steady when moving hands from sitting surface to RW, educated pt on optimal hand placement for trasnfer, able to hop to pivot EOB<>BSC and along EOB to HOB x3 with min guard for safety    Ambulation/Gait               General Gait Details: pt declining further ambulation trials due to increased pain in dependent position   Stairs             Wheelchair Mobility     Tilt Bed    Modified Rankin (Stroke Patients Only)       Balance Overall balance assessment: Needs assistance Sitting-balance support: No upper extremity supported, Feet supported Sitting balance-Leahy Scale: Good Sitting balance - Comments: sitting EOB   Standing balance support: Single extremity supported, Bilateral upper extremity supported Standing balance-Leahy Scale: Poor Standing balance comment: Reliant on UE support                            Cognition Arousal/Alertness: Awake/alert Behavior During Therapy: WFL for tasks assessed/performed Overall Cognitive Status: Within Functional Limits for tasks assessed  General Comments: pt pleasant and particapatory        Exercises      General Comments General comments (skin integrity, edema, etc.): VSS on RA, limb protector donned during session      Pertinent Vitals/Pain Pain Assessment Pain Assessment: Faces Faces Pain Scale: Hurts even more Pain Location: R residual limb Pain Descriptors / Indicators: Discomfort, Grimacing, Guarding, Operative site guarding Pain Intervention(s): Monitored during session,  Limited activity within patient's tolerance, Patient requesting pain meds-RN notified    Home Living                          Prior Function            PT Goals (current goals can now be found in the care plan section) Acute Rehab PT Goals Patient Stated Goal: to manage pain PT Goal Formulation: With patient Time For Goal Achievement: 06/14/23 Progress towards PT goals: Progressing toward goals    Frequency    Min 3X/week      PT Plan      Co-evaluation              AM-PAC PT "6 Clicks" Mobility   Outcome Measure  Help needed turning from your back to your side while in a flat bed without using bedrails?: A Little Help needed moving from lying on your back to sitting on the side of a flat bed without using bedrails?: A Little Help needed moving to and from a bed to a chair (including a wheelchair)?: A Lot Help needed standing up from a chair using your arms (e.g., wheelchair or bedside chair)?: A Little Help needed to walk in hospital room?: A Lot Help needed climbing 3-5 steps with a railing? : Total 6 Click Score: 14    End of Session Equipment Utilized During Treatment: Other (comment) (limb protector) Activity Tolerance: Patient tolerated treatment well Patient left: in bed;with call bell/phone within reach Nurse Communication: Mobility status;Patient requests pain meds PT Visit Diagnosis: Unsteadiness on feet (R26.81);Other abnormalities of gait and mobility (R26.89);Muscle weakness (generalized) (M62.81);Difficulty in walking, not elsewhere classified (R26.2);Pain Pain - Right/Left: Right Pain - part of body: Leg     Time: 7829-5621 PT Time Calculation (min) (ACUTE ONLY): 12 min  Charges:    $Therapeutic Activity: 8-22 mins PT General Charges $$ ACUTE PT VISIT: 1 Visit                     Richards Pherigo R. PTA Acute Rehabilitation Services Office: [phone]   Catalina Antigua 06/05/2023, 1:01 PM

## 2023-06-05 NOTE — Progress Notes (Signed)
  Progress Note    06/05/2023 3:18 PM 6 Days Post-Op  Subjective: Still having pain in the right lower knee amputation site  Vitals:   06/05/23 0723 06/05/23 1032  BP: 117/69 (!) 151/71  Pulse: 77 74  Resp: 17   Temp: 98.5 F (36.9 C)   SpO2: 99%     Physical Exam: Awake alert and oriented Nonlabored respirations Ambushield was in place but was removed to evaluate the incision which is intact with staples  CBC    Component Value Date/Time   WBC 9.1 05/31/2023 0535   RBC 3.59 (L) 05/31/2023 0535   HGB 9.8 (L) 05/31/2023 0535   HCT 27.1 (L) 05/31/2023 0535   PLT 240 05/31/2023 0535   MCV 75.5 (L) 05/31/2023 0535   MCH 27.3 05/31/2023 0535   MCHC 36.2 (H) 05/31/2023 0535   RDW 13.2 05/31/2023 0535   LYMPHSABS 1.8 05/31/2023 0535   MONOABS 0.6 05/31/2023 0535   EOSABS 0.0 05/31/2023 0535   BASOSABS 0.0 05/31/2023 0535    BMET    Component Value Date/Time   NA 132 (L) 05/31/2023 0535   NA 131 (L) 12/14/2020 0849   K 4.0 05/31/2023 0535   CL 105 05/31/2023 0535   CO2 18 (L) 05/31/2023 0535   GLUCOSE 212 (H) 05/31/2023 0535   BUN 26 (H) 05/31/2023 0535   BUN 26 12/14/2020 0849   CREATININE 1.40 (H) 05/31/2023 0535   CALCIUM 9.0 05/31/2023 0535   GFRNONAA 56 (L) 05/31/2023 0535   GFRAA 53 (L) 12/14/2020 0849    INR    Component Value Date/Time   INR 1.1 01/27/2022 1856     Intake/Output Summary (Last 24 hours) at 06/05/2023 1518 Last data filed at 06/05/2023 0856 Gross per 24 hour  Intake 236 ml  Output 700 ml  Net -464 ml     Assessment:  62 y.o. male is s/p right below-knee amputation now healing well.  Plan: Okay for discharge from vascular standpoint with plans for follow-up in 4 weeks for recheck and staple removal.  At that time we will make referral for prosthetic consideration.   Nina Hoar C. Randie Heinz, MD [facility] Office: [phone] Pager: [phone]  06/05/2023 3:18 PM

## 2023-06-05 NOTE — Progress Notes (Signed)

## 2023-06-05 NOTE — Plan of Care (Signed)
Patient is resting in bed with no complaints or distress noted or verbalized. Pain has been addressed and currently managed. All meds have been given and tolerated well. Plan of care is ongoing.  Problem: Education: Goal: Understanding of CV disease, CV risk reduction, and recovery process will improve Outcome: Progressing Goal: Individualized Educational Video(s) Outcome: Progressing   Problem: Activity: Goal: Ability to return to baseline activity level will improve Outcome: Progressing   Problem: Cardiovascular: Goal: Ability to achieve and maintain adequate cardiovascular perfusion will improve Outcome: Progressing Goal: Vascular access site(s) Level 0-1 will be maintained Outcome: Progressing   Problem: Health Behavior/Discharge Planning: Goal: Ability to safely manage health-related needs after discharge will improve Outcome: Progressing   Problem: Education: Goal: Ability to describe self-care measures that may prevent or decrease complications (Diabetes Survival Skills Education) will improve Outcome: Progressing Goal: Individualized Educational Video(s) Outcome: Progressing   Problem: Coping: Goal: Ability to adjust to condition or change in health will improve Outcome: Progressing   Problem: Fluid Volume: Goal: Ability to maintain a balanced intake and output will improve Outcome: Progressing   Problem: Health Behavior/Discharge Planning: Goal: Ability to identify and utilize available resources and services will improve Outcome: Progressing Goal: Ability to manage health-related needs will improve Outcome: Progressing   Problem: Metabolic: Goal: Ability to maintain appropriate glucose levels will improve Outcome: Progressing   Problem: Nutritional: Goal: Maintenance of adequate nutrition will improve Outcome: Progressing Goal: Progress toward achieving an optimal weight will improve Outcome: Progressing   Problem: Skin Integrity: Goal: Risk for impaired  skin integrity will decrease Outcome: Progressing   Problem: Tissue Perfusion: Goal: Adequacy of tissue perfusion will improve Outcome: Progressing   Problem: Education: Goal: Knowledge of General Education information will improve Description: Including pain rating scale, medication(s)/side effects and non-pharmacologic comfort measures Outcome: Progressing   Problem: Health Behavior/Discharge Planning: Goal: Ability to manage health-related needs will improve Outcome: Progressing   Problem: Clinical Measurements: Goal: Ability to maintain clinical measurements within normal limits will improve Outcome: Progressing Goal: Will remain free from infection Outcome: Progressing Goal: Diagnostic test results will improve Outcome: Progressing Goal: Respiratory complications will improve Outcome: Progressing Goal: Cardiovascular complication will be avoided Outcome: Progressing   Problem: Activity: Goal: Risk for activity intolerance will decrease Outcome: Progressing   Problem: Nutrition: Goal: Adequate nutrition will be maintained Outcome: Progressing   Problem: Coping: Goal: Level of anxiety will decrease Outcome: Progressing   Problem: Elimination: Goal: Will not experience complications related to bowel motility Outcome: Progressing Goal: Will not experience complications related to urinary retention Outcome: Progressing   Problem: Pain Managment: Goal: General experience of comfort will improve Outcome: Progressing   Problem: Safety: Goal: Ability to remain free from injury will improve Outcome: Progressing   Problem: Skin Integrity: Goal: Risk for impaired skin integrity will decrease Outcome: Progressing

## 2023-06-05 NOTE — Progress Notes (Signed)
PROGRESS NOTE    Jose [name]  ZOX:[id number] DOB: 1961/05/16 DOA: 05/27/2023 [facility]CP: [name]    Brief Narrative:  62 year old with history of coronary artery disease status post PCI, COPD, CKD stage IIIb, type 2 diabetes, hypertension hyperlipidemia, significant peripheral artery disease status post transmetatarsal amputation on 05/12/2023 presented with increasing pain and tissue loss at the TMA site.  Seen at vascular office and sent to admit for more proximal amputation. Underwent right BKA, significant phantom leg pain and surgical site pain. Waiting for transfer to SNF.   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  Has significant surgical pain today along with phantom limb. Pain control was challenging but now improving. Percocet 5-10 mg every 4 hours. Decrease dose of gabapentin to 100 mg 3 times daily. Continue amitriptyline 25 mg daily. Dilaudid 0.5 mg every 6 hours only for severe pain.  Hypomagnesemia: Replaced with improvement.  Type 2 diabetes with hyperglycemia: Appropriately controlled on current regimen.  Chronic medical issues including CKD stage IIIb, renal function stable at baseline. Essential hypertension, on amlodipine and Coreg. Coronary artery disease, stable on aspirin, Plavix, atorvastatin and Coreg. COPD, without exacerbation.  Stable. Hyperlipidemia, on atorvastatin.  Stable.  Nutrition Status: Nutrition Problem: Severe Malnutrition Etiology: acute illness Signs/Symptoms: energy intake < or equal to 50% for > or equal to 5 days, percent weight loss Percent weight loss: 14 % (in 1 month) Interventions: MVI, Glucerna shake   Clinically improving.  Is stable to transfer to SNF when bed available.  DVT prophylaxis: heparin injection 5,000 Units Start: 05/31/23 0600   Code Status: Full code Family Communication: None at  bedside Disposition Plan: Status is: Inpatient Remains inpatient appropriate because: Medically stable to transfer to SNF when bed is available.   Consultants:  Vascular  Procedures:  Right below-knee amputation 7/6  Antimicrobials:  Anti-infectives (From admission, onward)    Start     Dose/Rate Route Frequency Ordered Stop   05/27/23 2030  cefTRIAXone (ROCEPHIN) 2 g in sodium chloride 0.9 % 100 mL IVPB       Placed in "And" Linked Group   2 g 200 mL/hr over 30 Minutes Intravenous Every 24 hours 05/27/23 2029 05/30/23 2052   05/27/23 2030  metroNIDAZOLE (FLAGYL) IVPB 500 mg       Placed in "And" Linked Group   500 mg 100 mL/hr over 60 Minutes Intravenous Every 12 hours 05/27/23 2029 05/31/23 0858         Subjective:  Seen and examined.  He had better night.  He only used 1 dose of injectable last night but remained on Percocet.  He is more coherent and interactive today.  Looks forward to going to rehab at  [facility].  Objective: Vitals:   06/04/23 2111 06/05/23 0436 06/05/23 0723 06/05/23 1032  BP:  131/73 117/69 (!) 151/71  Pulse:  72 77 74  Resp:  18 17   Temp:  98.5 F (36.9 C) 98.5 F (36.9 C)   TempSrc:      SpO2: 98% 97% 99%   Weight:      Height:        Intake/Output Summary (Last 24 hours) at 06/05/2023 1045 Last data filed at 06/05/2023 0856 Gross per 24 hour  Intake 236 ml  Output 700 ml  Net -464 ml   Filed Weights   05/31/23 0521 06/02/23 0000 06/03/23 0527  Weight: 66.9 kg 70.6 kg 69.4  kg    Examination:    General: Looks comfortable.  Interactive. Cardiovascular: S1-S2 normal.  Regular rate rhythm. Respiratory: Bilateral clear.  No added sounds. Gastrointestinal: Soft.  Nontender.  Bowel sound present. Ext: No swelling or edema.  Right below-knee amputation the stump clean and dry.  Staples intact.    Data Reviewed: I have personally reviewed following labs and imaging studies  CBC: Recent Labs  Lab 05/31/23 0535  WBC 9.1  NEUTROABS  6.7  HGB 9.8*  HCT 27.1*  MCV 75.5*  PLT 240   Basic Metabolic Panel: Recent Labs  Lab 05/31/23 0535  NA 132*  K 4.0  CL 105  CO2 18*  GLUCOSE 212*  BUN 26*  CREATININE 1.40*  CALCIUM 9.0  MG 1.5*   GFR: Estimated Creatinine Clearance: 52.3 mL/min (A) (by C-G formula based on SCr of 1.4 mg/dL (H)). Liver Function Tests: No results for input(s): "AST", "ALT", "ALKPHOS", "BILITOT", "PROT", "ALBUMIN" in the last 168 hours. No results for input(s): "LIPASE", "AMYLASE" in the last 168 hours. No results for input(s): "AMMONIA" in the last 168 hours. Coagulation Profile: No results for input(s): "INR", "PROTIME" in the last 168 hours. Cardiac Enzymes: No results for input(s): "CKTOTAL", "CKMB", "CKMBINDEX", "TROPONINI" in the last 168 hours. BNP (last 3 results) No results for input(s): "PROBNP" in the last 8760 hours. HbA1C: No results for input(s): "HGBA1C" in the last 72 hours. CBG: Recent Labs  Lab 06/03/23 1951 06/04/23 0811 06/04/23 1349 06/04/23 1650 06/05/23 0724  GLUCAP 159* 269* 343* 145* 165*   Lipid Profile: No results for input(s): "CHOL", "HDL", "LDLCALC", "TRIG", "CHOLHDL", "LDLDIRECT" in the last 72 hours. Thyroid Function Tests: No results for input(s): "TSH", "T4TOTAL", "FREET4", "T3FREE", "THYROIDAB" in the last 72 hours. Anemia Panel: No results for input(s): "VITAMINB12", "FOLATE", "FERRITIN", "TIBC", "IRON", "RETICCTPCT" in the last 72 hours. Sepsis Labs: No results for input(s): "PROCALCITON", "LATICACIDVEN" in the last 168 hours.  Recent Results (from the past 240 hour(s))  Blood Cultures x 2 sites     Status: None   Collection Time: 05/27/23 10:25 PM   Specimen: BLOOD  Result Value Ref Range Status   Specimen Description BLOOD SITE NOT SPECIFIED  Final   Special Requests   Final    BOTTLES DRAWN AEROBIC AND ANAEROBIC Blood Culture adequate volume   Culture   Final    NO GROWTH 5 DAYS Performed at [facility], [address] [place], [place] [address]    Report Status 06/01/2023 FINAL  Final  Blood Cultures x 2 sites     Status: None   Collection Time: 05/28/23  1:08 AM   Specimen: BLOOD LEFT HAND  Result Value Ref Range Status   Specimen Description BLOOD LEFT HAND  Final   Special Requests   Final    BOTTLES DRAWN AEROBIC AND ANAEROBIC Blood Culture adequate volume   Culture   Final    NO GROWTH 5 DAYS Performed at [facility], [address], [place], [place] [address]    Report Status 06/02/2023 FINAL  Final         Radiology Studies: No results found.      Scheduled Meds:  amitriptyline  25 mg Oral QHS   amLODipine  10 mg Oral Daily   aspirin EC  81 mg Oral Daily   atorvastatin  80 mg Oral QPM   brinzolamide  1 drop Both Eyes TID   And   brimonidine  1 drop Both Eyes TID  carvedilol  12.5 mg Oral BID WC   Chlorhexidine Gluconate Cloth  6 each Topical Q0600   feeding supplement (GLUCERNA SHAKE)  237 mL Oral TID BM   gabapentin  100 mg Oral TID   heparin  5,000 Units Subcutaneous Q8H   insulin aspart  0-5 Units Subcutaneous QHS   insulin aspart  0-9 Units Subcutaneous TID WC   insulin glargine-yfgn  30 Units Subcutaneous Daily   latanoprost  1 drop Both Eyes QHS   magnesium oxide  400 mg Oral BID   mometasone-formoterol  2 puff Inhalation BID   multivitamin  1 tablet Oral QHS   nutrition supplement (JUVEN)  1 packet Oral BID BM   senna  1 tablet Oral BID   Continuous Infusions:     LOS: 9 days    Time spent: 35 minutes    Dorcas Carrow, MD [facility]

## 2023-06-06 DIAGNOSIS — T8189XA Other complications of procedures, not elsewhere classified, initial encounter: Secondary | ICD-10-CM | POA: Diagnosis not present

## 2023-06-06 LAB — GLUCOSE, CAPILLARY
Glucose-Capillary: 196 mg/dL — ABNORMAL HIGH (ref 70–99)
Glucose-Capillary: 334 mg/dL — ABNORMAL HIGH (ref 70–99)
Glucose-Capillary: 285 mg/dL — ABNORMAL HIGH (ref 70–99)
Glucose-Capillary: 254 mg/dL — ABNORMAL HIGH (ref 70–99)

## 2023-06-06 MED ORDER — POLYETHYLENE GLYCOL 3350 17 G PO PACK
17.0000 g | PACK | Freq: Every day | ORAL | Status: DC
  Administered 2023-06-06 – 2023-06-11 (×5): 17 g via ORAL
  Filled 2023-06-06 (×6): qty 1

## 2023-06-06 NOTE — Plan of Care (Signed)

## 2023-06-06 NOTE — Plan of Care (Signed)
Patient is resting in bed with stable vitals and respirations. Patient has not made a bowel movement in 3 days. MD notified. Patient given miralax. Patient has been educated about how pain meds can cause constipation. Patient reports understanding. Plan of care is ongoing. Problem: Education: Goal: Understanding of CV disease, CV risk reduction, and recovery process will improve Outcome: Progressing Goal: Individualized Educational Video(s) Outcome: Progressing   Problem: Activity: Goal: Ability to return to baseline activity level will improve Outcome: Progressing   Problem: Cardiovascular: Goal: Ability to achieve and maintain adequate cardiovascular perfusion will improve Outcome: Progressing Goal: Vascular access site(s) Level 0-1 will be maintained Outcome: Progressing   Problem: Health Behavior/Discharge Planning: Goal: Ability to safely manage health-related needs after discharge will improve Outcome: Progressing   Problem: Education: Goal: Ability to describe self-care measures that may prevent or decrease complications (Diabetes Survival Skills Education) will improve Outcome: Progressing Goal: Individualized Educational Video(s) Outcome: Progressing   Problem: Coping: Goal: Ability to adjust to condition or change in health will improve Outcome: Progressing   Problem: Fluid Volume: Goal: Ability to maintain a balanced intake and output will improve Outcome: Progressing   Problem: Health Behavior/Discharge Planning: Goal: Ability to identify and utilize available resources and services will improve Outcome: Progressing Goal: Ability to manage health-related needs will improve Outcome: Progressing   Problem: Metabolic: Goal: Ability to maintain appropriate glucose levels will improve Outcome: Progressing   Problem: Nutritional: Goal: Maintenance of adequate nutrition will improve Outcome: Progressing Goal: Progress toward achieving an optimal weight will  improve Outcome: Progressing   Problem: Skin Integrity: Goal: Risk for impaired skin integrity will decrease Outcome: Progressing   Problem: Tissue Perfusion: Goal: Adequacy of tissue perfusion will improve Outcome: Progressing   Problem: Education: Goal: Knowledge of General Education information will improve Description: Including pain rating scale, medication(s)/side effects and non-pharmacologic comfort measures Outcome: Progressing   Problem: Health Behavior/Discharge Planning: Goal: Ability to manage health-related needs will improve Outcome: Progressing   Problem: Clinical Measurements: Goal: Ability to maintain clinical measurements within normal limits will improve Outcome: Progressing Goal: Will remain free from infection Outcome: Progressing Goal: Diagnostic test results will improve Outcome: Progressing Goal: Respiratory complications will improve Outcome: Progressing Goal: Cardiovascular complication will be avoided Outcome: Progressing   Problem: Activity: Goal: Risk for activity intolerance will decrease Outcome: Progressing   Problem: Nutrition: Goal: Adequate nutrition will be maintained Outcome: Progressing   Problem: Coping: Goal: Level of anxiety will decrease Outcome: Progressing   Problem: Elimination: Goal: Will not experience complications related to bowel motility Outcome: Progressing Goal: Will not experience complications related to urinary retention Outcome: Progressing   Problem: Pain Managment: Goal: General experience of comfort will improve Outcome: Progressing   Problem: Safety: Goal: Ability to remain free from injury will improve Outcome: Progressing   Problem: Skin Integrity: Goal: Risk for impaired skin integrity will decrease Outcome: Progressing

## 2023-06-06 NOTE — Progress Notes (Signed)
PROGRESS NOTE    Jose [name]  ZOX:[id number] DOB: 08-08-1961 DOA: 05/27/2023 [facility]: [name]    Brief Narrative:  62 year old with history of coronary artery disease status post PCI, COPD, CKD stage IIIb, type 2 diabetes, hypertension hyperlipidemia, significant peripheral artery disease status post transmetatarsal amputation on 05/12/2023 presented with increasing pain and tissue loss at the TMA site.  Seen at vascular office and sent to admit for more proximal amputation. Underwent right BKA, significant phantom leg pain and surgical site pain. Waiting for transfer to SNF.   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  Has significant surgical pain today along with phantom limb. Pain control was challenging but now improving. Percocet 5-10 mg every 4 hours. Decrease dose of gabapentin to 100 mg 3 times daily. Continue amitriptyline 25 mg daily. Discontinue further injectable Dilaudid.  Hypomagnesemia: Replaced with improvement.  Type 2 diabetes with hyperglycemia: Appropriately controlled on current regimen.  Chronic medical issues including CKD stage IIIb, renal function stable at baseline. Essential hypertension, on amlodipine and Coreg. Coronary artery disease, stable on aspirin, Plavix, atorvastatin and Coreg. COPD, without exacerbation.  Stable. Hyperlipidemia, on atorvastatin.  Stable.  Nutrition Status: Nutrition Problem: Severe Malnutrition Etiology: acute illness Signs/Symptoms: energy intake < or equal to 50% for > or equal to 5 days, percent weight loss Percent weight loss: 14 % (in 1 month) Interventions: MVI, Glucerna shake   Clinically improving.  Is stable to transfer to SNF when bed available.  DVT prophylaxis: heparin injection 5,000 Units Start: 05/31/23 0600   Code Status: Full code Family Communication: None at bedside Disposition Plan:  Status is: Inpatient Remains inpatient appropriate because: Medically stable to transfer to SNF when bed is available.   Consultants:  Vascular  Procedures:  Right below-knee amputation 7/6  Antimicrobials:  Anti-infectives (From admission, onward)    Start     Dose/Rate Route Frequency Ordered Stop   05/27/23 2030  cefTRIAXone (ROCEPHIN) 2 g in sodium chloride 0.9 % 100 mL IVPB       Placed in "And" Linked Group   2 g 200 mL/hr over 30 Minutes Intravenous Every 24 hours 05/27/23 2029 05/30/23 2052   05/27/23 2030  metroNIDAZOLE (FLAGYL) IVPB 500 mg       Placed in "And" Linked Group   500 mg 100 mL/hr over 60 Minutes Intravenous Every 12 hours 05/27/23 2029 05/31/23 0858         Subjective:  Patient seen and examined.  No overnight events.  Still gets episodic pain on the stump however he is agreeable to stop using injectable medication and managed with oral pain medication.  Waiting to hear from insurance company to go to a SNF.  Objective: Vitals:   06/06/23 0450 06/06/23 0726 06/06/23 0817 06/06/23 1106  BP: 132/75 (!) 150/79  132/68  Pulse: 77 89  79  Resp: 17 17    Temp: 98.2 F (36.8 C) 97.8 F (36.6 C)    TempSrc: Oral Oral    SpO2: 96% 99% 96%   Weight:      Height:        Intake/Output Summary (Last 24 hours) at 06/06/2023 1143 Last data filed at 06/06/2023 0900 Gross per 24 hour  Intake 426 ml  Output 200 ml  Net 226 ml   Filed Weights   05/31/23 0521 06/02/23 0000 06/03/23 0527  Weight: 66.9 kg 70.6 kg 69.4 kg  Examination:    General: Looks comfortable.   Cardiovascular: S1-S2 normal.  Regular rate rhythm. Respiratory: Bilateral clear.  No added sounds.  On room air. Gastrointestinal: Soft.  Nontender.  Bowel sound present. Ext: No swelling or edema.  Right below-knee amputation the stump clean and dry.  Staples intact.  On immobilizer.    Data Reviewed: I have personally reviewed following labs and imaging studies  CBC: Recent Labs   Lab 05/31/23 0535  WBC 9.1  NEUTROABS 6.7  HGB 9.8*  HCT 27.1*  MCV 75.5*  PLT 240   Basic Metabolic Panel: Recent Labs  Lab 05/31/23 0535  NA 132*  K 4.0  CL 105  CO2 18*  GLUCOSE 212*  BUN 26*  CREATININE 1.40*  CALCIUM 9.0  MG 1.5*   GFR: Estimated Creatinine Clearance: 52.3 mL/min (A) (by C-G formula based on SCr of 1.4 mg/dL (H)). Liver Function Tests: No results for input(s): "AST", "ALT", "ALKPHOS", "BILITOT", "PROT", "ALBUMIN" in the last 168 hours. No results for input(s): "LIPASE", "AMYLASE" in the last 168 hours. No results for input(s): "AMMONIA" in the last 168 hours. Coagulation Profile: No results for input(s): "INR", "PROTIME" in the last 168 hours. Cardiac Enzymes: No results for input(s): "CKTOTAL", "CKMB", "CKMBINDEX", "TROPONINI" in the last 168 hours. BNP (last 3 results) No results for input(s): "PROBNP" in the last 8760 hours. HbA1C: No results for input(s): "HGBA1C" in the last 72 hours. CBG: Recent Labs  Lab 06/05/23 0724 06/05/23 1140 06/05/23 1634 06/05/23 2112 06/06/23 0720  GLUCAP 165* 265* 193* 136* 196*   Lipid Profile: No results for input(s): "CHOL", "HDL", "LDLCALC", "TRIG", "CHOLHDL", "LDLDIRECT" in the last 72 hours. Thyroid Function Tests: No results for input(s): "TSH", "T4TOTAL", "FREET4", "T3FREE", "THYROIDAB" in the last 72 hours. Anemia Panel: No results for input(s): "VITAMINB12", "FOLATE", "FERRITIN", "TIBC", "IRON", "RETICCTPCT" in the last 72 hours. Sepsis Labs: No results for input(s): "PROCALCITON", "LATICACIDVEN" in the last 168 hours.  Recent Results (from the past 240 hour(s))  Blood Cultures x 2 sites     Status: None   Collection Time: 05/27/23 10:25 PM   Specimen: BLOOD  Result Value Ref Range Status   Specimen Description BLOOD SITE NOT SPECIFIED  Final   Special Requests   Final    BOTTLES DRAWN AEROBIC AND ANAEROBIC Blood Culture adequate volume   Culture   Final    NO GROWTH 5 DAYS Performed  at [facility], [address], [place], [place] [address]    Report Status 06/01/2023 FINAL  Final  Blood Cultures x 2 sites     Status: None   Collection Time: 05/28/23  1:08 AM   Specimen: BLOOD LEFT HAND  Result Value Ref Range Status   Specimen Description BLOOD LEFT HAND  Final   Special Requests   Final    BOTTLES DRAWN AEROBIC AND ANAEROBIC Blood Culture adequate volume   Culture   Final    NO GROWTH 5 DAYS Performed at [facility], [address] [place], [place] [address]    Report Status 06/02/2023 FINAL  Final         Radiology Studies: No results found.      Scheduled Meds:  amitriptyline  25 mg Oral QHS   amLODipine  10 mg Oral Daily   aspirin EC  81 mg Oral Daily   atorvastatin  80 mg Oral QPM   brinzolamide  1 drop Both Eyes TID   And   brimonidine  1 drop  Both Eyes TID   carvedilol  12.5 mg Oral BID WC   Chlorhexidine Gluconate Cloth  6 each Topical Q0600   feeding supplement (GLUCERNA SHAKE)  237 mL Oral TID BM   gabapentin  100 mg Oral TID   heparin  5,000 Units Subcutaneous Q8H   insulin aspart  0-5 Units Subcutaneous QHS   insulin aspart  0-9 Units Subcutaneous TID WC   insulin glargine-yfgn  30 Units Subcutaneous Daily   latanoprost  1 drop Both Eyes QHS   magnesium oxide  400 mg Oral BID   mometasone-formoterol  2 puff Inhalation BID   multivitamin  1 tablet Oral QHS   nutrition supplement (JUVEN)  1 packet Oral BID BM   polyethylene glycol  17 g Oral Daily   senna  1 tablet Oral BID   sodium chloride flush  10-40 mL Intracatheter Q12H   Continuous Infusions:     LOS: 10 days    Time spent: 35 minutes    Dorcas Carrow, MD [facility]

## 2023-06-07 ENCOUNTER — Encounter (HOSPITAL_COMMUNITY): Payer: Self-pay | Admitting: Internal Medicine

## 2023-06-07 DIAGNOSIS — J42 Unspecified chronic bronchitis: Secondary | ICD-10-CM | POA: Diagnosis not present

## 2023-06-07 DIAGNOSIS — I152 Hypertension secondary to endocrine disorders: Secondary | ICD-10-CM

## 2023-06-07 DIAGNOSIS — E1159 Type 2 diabetes mellitus with other circulatory complications: Secondary | ICD-10-CM

## 2023-06-07 DIAGNOSIS — Z89511 Acquired absence of right leg below knee: Secondary | ICD-10-CM | POA: Diagnosis not present

## 2023-06-07 DIAGNOSIS — I2510 Atherosclerotic heart disease of native coronary artery without angina pectoris: Secondary | ICD-10-CM

## 2023-06-07 DIAGNOSIS — Z794 Long term (current) use of insulin: Secondary | ICD-10-CM

## 2023-06-07 DIAGNOSIS — T8189XA Other complications of procedures, not elsewhere classified, initial encounter: Secondary | ICD-10-CM | POA: Diagnosis not present

## 2023-06-07 DIAGNOSIS — I739 Peripheral vascular disease, unspecified: Secondary | ICD-10-CM

## 2023-06-07 DIAGNOSIS — E118 Type 2 diabetes mellitus with unspecified complications: Secondary | ICD-10-CM

## 2023-06-07 LAB — CBC WITH DIFFERENTIAL/PLATELET
Abs Immature Granulocytes: 0.02 10*3/uL (ref 0.00–0.07)
Basophils Absolute: 0 10*3/uL (ref 0.0–0.1)
Basophils Relative: 0 %
Eosinophils Absolute: 0.1 10*3/uL (ref 0.0–0.5)
Eosinophils Relative: 1 %
HCT: 31.7 % — ABNORMAL LOW (ref 39.0–52.0)
Hemoglobin: 11.3 g/dL — ABNORMAL LOW (ref 13.0–17.0)
Immature Granulocytes: 0 %
Lymphocytes Relative: 18 %
Lymphs Abs: 1.6 10*3/uL (ref 0.7–4.0)
MCH: 27.3 pg (ref 26.0–34.0)
MCHC: 35.6 g/dL (ref 30.0–36.0)
MCV: 76.6 fL — ABNORMAL LOW (ref 80.0–100.0)
Monocytes Absolute: 0.7 10*3/uL (ref 0.1–1.0)
Monocytes Relative: 8 %
Neutro Abs: 6.4 10*3/uL (ref 1.7–7.7)
Neutrophils Relative %: 73 %
Platelets: 337 10*3/uL (ref 150–400)
RBC: 4.14 MIL/uL — ABNORMAL LOW (ref 4.22–5.81)
RDW: 13.2 % (ref 11.5–15.5)
WBC: 8.9 10*3/uL (ref 4.0–10.5)
nRBC: 0 % (ref 0.0–0.2)

## 2023-06-07 LAB — COMPREHENSIVE METABOLIC PANEL
ALT: 78 U/L — ABNORMAL HIGH (ref 0–44)
AST: 71 U/L — ABNORMAL HIGH (ref 15–41)
Albumin: 2.8 g/dL — ABNORMAL LOW (ref 3.5–5.0)
Alkaline Phosphatase: 78 U/L (ref 38–126)
Anion gap: 14 (ref 5–15)
BUN: 26 mg/dL — ABNORMAL HIGH (ref 8–23)
CO2: 21 mmol/L — ABNORMAL LOW (ref 22–32)
Calcium: 9.6 mg/dL (ref 8.9–10.3)
Chloride: 95 mmol/L — ABNORMAL LOW (ref 98–111)
Creatinine, Ser: 1.29 mg/dL — ABNORMAL HIGH (ref 0.61–1.24)
GFR, Estimated: >60 mL/min (ref >60)
Glucose, Bld: 254 mg/dL — ABNORMAL HIGH (ref 70–99)
Potassium: 5.1 mmol/L (ref 3.5–5.1)
Sodium: 130 mmol/L — ABNORMAL LOW (ref 135–145)
Total Bilirubin: 0.2 mg/dL — ABNORMAL LOW (ref 0.3–1.2)
Total Protein: 6.6 g/dL (ref 6.5–8.1)

## 2023-06-07 LAB — GLUCOSE, CAPILLARY
Glucose-Capillary: 302 mg/dL — ABNORMAL HIGH (ref 70–99)
Glucose-Capillary: 351 mg/dL — ABNORMAL HIGH (ref 70–99)
Glucose-Capillary: 219 mg/dL — ABNORMAL HIGH (ref 70–99)
Glucose-Capillary: 264 mg/dL — ABNORMAL HIGH (ref 70–99)

## 2023-06-07 LAB — MAGNESIUM: Magnesium: 2 mg/dL (ref 1.7–2.4)

## 2023-06-07 MED ORDER — CARVEDILOL 6.25 MG PO TABS
18.7500 mg | ORAL_TABLET | Freq: Two times a day (BID) | ORAL | Status: DC
  Administered 2023-06-08 – 2023-06-12 (×9): 18.75 mg via ORAL
  Filled 2023-06-07 (×9): qty 1

## 2023-06-07 MED ORDER — NALOXONE HCL 0.4 MG/ML IJ SOLN: 0.4000 mg | INTRAMUSCULAR | Status: DC | PRN

## 2023-06-07 MED ORDER — FAMOTIDINE 20 MG PO TABS
20.0000 mg | ORAL_TABLET | Freq: Every day | ORAL | Status: DC
  Administered 2023-06-07 – 2023-06-11 (×5): 20 mg via ORAL
  Filled 2023-06-07 (×5): qty 1

## 2023-06-07 MED ORDER — INSULIN GLARGINE-YFGN 100 UNIT/ML ~~LOC~~ SOLN
5.0000 [IU] | Freq: Once | SUBCUTANEOUS | Status: AC
  Administered 2023-06-07: 5 [IU] via SUBCUTANEOUS
  Filled 2023-06-07: qty 0.05

## 2023-06-07 MED ORDER — HYDROMORPHONE HCL 1 MG/ML IJ SOLN
0.5000 mg | Freq: Once | INTRAMUSCULAR | Status: AC | PRN
  Administered 2023-06-07: 0.5 mg via INTRAVENOUS
  Filled 2023-06-07: qty 0.5

## 2023-06-07 MED ORDER — LACTATED RINGERS IV SOLN: INTRAVENOUS | Status: AC

## 2023-06-07 MED ORDER — INSULIN GLARGINE-YFGN 100 UNIT/ML ~~LOC~~ SOLN
35.0000 [IU] | Freq: Every day | SUBCUTANEOUS | Status: DC
  Administered 2023-06-08 – 2023-06-09 (×2): 35 [IU] via SUBCUTANEOUS
  Filled 2023-06-07 (×2): qty 0.35

## 2023-06-07 MED ORDER — CALCIUM CARBONATE ANTACID 500 MG PO CHEW
2.0000 | CHEWABLE_TABLET | Freq: Three times a day (TID) | ORAL | Status: DC | PRN
  Administered 2023-06-07 – 2023-06-08 (×5): 400 mg via ORAL
  Filled 2023-06-07 (×6): qty 2

## 2023-06-07 NOTE — Assessment & Plan Note (Signed)
Stable. Remain with BKA protector to stump. F/u with vascular surgery.

## 2023-06-07 NOTE — Assessment & Plan Note (Signed)
On asa 81 mg and lipitor 80 mg.

## 2023-06-07 NOTE — Progress Notes (Signed)
Unable to scan medications for this patient as the scanner is not working

## 2023-06-07 NOTE — Progress Notes (Signed)
  Called and spoke with pt's niece June Leap @ [phone]. Gave her update on pt's condition.   Carollee Herter, DO [facility]

## 2023-06-07 NOTE — Progress Notes (Signed)
PROGRESS NOTE    Jose [name]  ZOX:[id number] DOB: Apr 11, 1961 DOA: 05/27/2023 PCP: [name]  Subjective: Pt c/o of nausea this AM after taking oxycodone. Still waiting insurance authorization and SNF bed offer. Right BKA pain well controlled. Had BM today. Afebrile.   Hospital Course: Jose [name] is a 62 y.o. male with medical history significant for CAD s/p PCI, COPD, CKD stage IIIb, T2DM, HTN, HLD, PAD s/p right TMA 05/12/2023 who is admitted with increasing pain and tissue loss of the right TMA site.  Seen by vascular surgery who recommended medical admission and consideration of right BKA.  HPI: Jose [name] is a 62 y.o. male with medical history significant for CAD s/p PCI, COPD, CKD stage IIIb, T2DM, HTN, HLD, PAD s/p right TMA 05/12/2023 who presented to the ED for evaluation of increasing pain and tissue loss at the right TMA site.   Patient previously underwent shockwave lithotripsy and balloon angioplasty of the right popliteal artery on 04/27/2023 by vascular surgery Dr. Randie Heinz due to gangrenous toes.  He was readmitted 6/15-6/24 due to progressive dry gangrene.  He underwent right sided transmetatarsal amputation by Dr. Edilia Bo on 05/12/2023.  There was concern for superimposed infection and patient was treated with IV Rocephin in hospital and discharged on 1 week of Augmentin and doxycycline.   Patient states that he has been having uncontrolled pain at the surgical wound site.  He ran out of his pain medications 3 days ago and return to the vascular surgery clinic today for further evaluation and for medication refill.   Patient was noted to have worsening tissue loss at the surgical wound site as well as soft BP 96/66 and mild tachycardia 108.  Patient was advised to present to the ED for evaluation in anticipation of admission.  They discussed potential need for right below the knee amputation.   Patient states that pain seems to be easing after  receiving IV narcotics while in the ED.  He denies any fevers, diaphoresis, chest pain, dyspnea.  Has had occasional chills.  He has not seen any significant discharge from his surgical wound site.  He reports seeing blood on surgical dressing when it was unwrapped in the clinic today.  He is tearful due to concern for needing further surgery.   ED Course  Labs/Imaging on admission: I have personally reviewed following labs and imaging studies.   Initial vitals showed BP 145/93, pulse 102, RR 17, temp 97.7 F, SpO2 100% on room air.   Labs showed WBC 8.0, hemoglobin 12.1, platelets 268,000, sodium 132, potassium 3.4, bicarb 15, BUN 25, creatinine 1.73, serum glucose 174, LFTs within normal limits.  Blood cultures ordered and pending collection.  Lactic acid 1.8.   Right foot x-ray shows interval transmetatarsal amputation of all 5 digits with overlying soft tissue swelling but no other acute finding.  No osseous erosion or destruction.   Patient was given IV ceftriaxone and Flagyl, IV morphine 4 mg.  Vascular surgery consulted and recommended medical admission.  The hospitalist service was consulted to admit for further evaluation and management.  Significant Events: Admitted 05-27-2023  Significant Labs:   Significant Imaging Studies:   Antibiotic Therapy: Rocephin 2 g every day for 24 hours(05-29-2023 thru 05-30-2023) Flagyl 500 mg IV q12h for 24 hours(05-29-2023 thru 05-30-2023)  Procedures: Right BKA 05-30-2023  Consultants: Vascular surgery - Dr. Randie Heinz     Assessment and Plan: * Nonhealing surgical wound, initial encounter Had gangrene on admission to right foot. Now  s/p right BKA on 05-30-2023 by vascular surgery.  S/P BKA (below knee amputation) unilateral, right (HCC) - on 05-30-2023 Stable. Remain with BKA protector to stump. F/u with vascular surgery.  Protein-calorie malnutrition, severe Stable.  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) Resolved now after right  BKA.  CAD (coronary artery disease) On asa 81 mg and lipitor 80 mg.  COPD (chronic obstructive pulmonary disease) (HCC) Stable.  Chronic kidney disease, stage 3b (HCC) Stable. Scr 1.4 repeat CBC and BMP in AM.  Dyslipidemia associated with type 2 diabetes mellitus (HCC) Continue with lipitor 80 mg daily.  Type 2 diabetes mellitus with complication, with long-term current use of insulin (HCC) Stable. Continue with lantus and SSI. Increase lantus to 35 units.(From 30 units)  Hypertension associated with diabetes (HCC) Stable. Increase coreg 18.75 mg bid(was on 12.5 mg bid) for better BP control. No ACEI/ARB due to CKD stage 3b   DVT prophylaxis: SQ Heparin   Code Status: Full Code Family Communication: no family at bedside Disposition Plan: SNF Reason for continuing need for hospitalization: awaiting insurance authorization/SNF bed offer.  Objective: Vitals:   06/07/23 0454 06/07/23 0500 06/07/23 0740 06/07/23 0838  BP: (!) 140/78  134/76   Pulse: 81  92 65  Resp: 15  16 16   Temp: 98.4 F (36.9 C)  97.9 F (36.6 C)   TempSrc: Oral  Oral   SpO2: 99%  99% 95%  Weight:  69.8 kg    Height:        Intake/Output Summary (Last 24 hours) at 06/07/2023 1201 Last data filed at 06/07/2023 1002 Gross per 24 hour  Intake 472 ml  Output 300 ml  Net 172 ml   Filed Weights   06/02/23 0000 06/03/23 0527 06/07/23 0500  Weight: 70.6 kg 69.4 kg 69.8 kg    Examination:  Physical Exam Vitals and nursing note reviewed.  Constitutional:      General: He is not in acute distress. HENT:     Head: Normocephalic and atraumatic.     Nose: Nose normal.  Cardiovascular:     Rate and Rhythm: Normal rate and regular rhythm.  Pulmonary:     Effort: Pulmonary effort is normal.     Breath sounds: Normal breath sounds.  Abdominal:     General: Bowel sounds are normal. There is no distension.  Musculoskeletal:     Comments: Right BKA  Skin:    General: Skin is warm and dry.      Capillary Refill: Capillary refill takes less than 2 seconds.  Neurological:     Mental Status: He is alert and oriented to person, place, and time.     Data Reviewed: I have personally reviewed following labs and imaging studies  CBG: Recent Labs  Lab 06/06/23 1200 06/06/23 1802 06/06/23 2034 06/07/23 0743 06/07/23 1145  GLUCAP 334* 285* 254* 302* 351*    No results found for this or any previous visit (from the past 240 hour(s)).   Radiology Studies: No results found.   Scheduled Meds:  amitriptyline  25 mg Oral QHS   amLODipine  10 mg Oral Daily   aspirin EC  81 mg Oral Daily   atorvastatin  80 mg Oral QPM   brinzolamide  1 drop Both Eyes TID   And   brimonidine  1 drop Both Eyes TID   carvedilol  18.75 mg Oral BID WC   Chlorhexidine Gluconate Cloth  6 each Topical Q0600   famotidine  20 mg Oral Daily  feeding supplement (GLUCERNA SHAKE)  237 mL Oral TID BM   gabapentin  100 mg Oral TID   heparin  5,000 Units Subcutaneous Q8H   insulin aspart  0-5 Units Subcutaneous QHS   insulin aspart  0-9 Units Subcutaneous TID WC   [START ON 06/08/2023] insulin glargine-yfgn  35 Units Subcutaneous Daily   insulin glargine-yfgn  5 Units Subcutaneous Once   latanoprost  1 drop Both Eyes QHS   magnesium oxide  400 mg Oral BID   mometasone-formoterol  2 puff Inhalation BID   multivitamin  1 tablet Oral QHS   nutrition supplement (JUVEN)  1 packet Oral BID BM   polyethylene glycol  17 g Oral Daily   senna  1 tablet Oral BID   sodium chloride flush  10-40 mL Intracatheter Q12H   Continuous Infusions:   LOS: 11 days    Time spent: 40 minutes    Carollee Herter, DO  [facility]  06/07/2023, 12:01 PM

## 2023-06-07 NOTE — Progress Notes (Signed)
TRH night cross cover note:   I was notified by RN that this patient is experiencing some discomfort at the site of his right BKA.  He had similar discomfort this morning, which was relieved with dose of existing prn Percocet, but notes that he experienced some nausea as a side effect of this medication.  As result, the patient is specifically requesting a one-time dose of IV Dilaudid for the above discomfort.  I subsequently placed order for Dilaudid 0.5 mg IV x 1 dose prn for pain, and confirmed existing dose for prn IV Zofran for nausea.    Newton Pigg, DO Hospitalist

## 2023-06-07 NOTE — Assessment & Plan Note (Addendum)
Stable. Continue with lantus and SSI. AM CBG 369.  Increase lantus to 40 units.(From 35 units)

## 2023-06-07 NOTE — Assessment & Plan Note (Signed)
Had gangrene on admission to right foot. Now s/p right BKA on 05-30-2023 by vascular surgery.

## 2023-06-07 NOTE — Subjective & Objective (Addendum)
Stable overnight. Required 1 dose of IV dilaudid to pain. Advised pt to take tylenol for mild pain in order to prevent any further nausea. Talked with pt's niece [place] yesterday. Still awaiting SNF bed offers and insurance authorization. Pt had been medically stable for discharge for several days.

## 2023-06-07 NOTE — Assessment & Plan Note (Addendum)
Stable. Scr 1.4 repeat CBC and BMP in AM.

## 2023-06-07 NOTE — Progress Notes (Signed)
Patient continues to report "burning" feeling in stomach and not feeling well.  MD contacted again and order obtained for IV fluids.  PRN zofran given.

## 2023-06-07 NOTE — Assessment & Plan Note (Signed)
Stable

## 2023-06-07 NOTE — Assessment & Plan Note (Addendum)
Stable. BP improved on increased dose of coreg 18.75 mg bid. No ACEI/ARB due to CKD stage 3b

## 2023-06-07 NOTE — Progress Notes (Signed)
Patient continues to report not  feeling well and "burning" feeling in his stomach has continued despite pepcid and zofran.  IV fluid infusing per order.  MD aware and ordering labs.

## 2023-06-07 NOTE — Assessment & Plan Note (Signed)
Resolved now after right BKA.

## 2023-06-07 NOTE — Assessment & Plan Note (Signed)
Continue with lipitor 80 mg daily. 

## 2023-06-07 NOTE — Progress Notes (Signed)
At this time patient states his stomach is "burning" and he "just doesn't feel good".  MD contacted and order obtained for pepcid.

## 2023-06-08 DIAGNOSIS — E1169 Type 2 diabetes mellitus with other specified complication: Secondary | ICD-10-CM | POA: Diagnosis not present

## 2023-06-08 DIAGNOSIS — N1832 Chronic kidney disease, stage 3b: Secondary | ICD-10-CM | POA: Diagnosis not present

## 2023-06-08 DIAGNOSIS — Z89511 Acquired absence of right leg below knee: Secondary | ICD-10-CM | POA: Diagnosis not present

## 2023-06-08 DIAGNOSIS — E785 Hyperlipidemia, unspecified: Secondary | ICD-10-CM

## 2023-06-08 DIAGNOSIS — T8189XA Other complications of procedures, not elsewhere classified, initial encounter: Secondary | ICD-10-CM | POA: Diagnosis not present

## 2023-06-08 DIAGNOSIS — E871 Hypo-osmolality and hyponatremia: Secondary | ICD-10-CM

## 2023-06-08 LAB — COMPREHENSIVE METABOLIC PANEL
ALT: 78 U/L — ABNORMAL HIGH (ref 0–44)
AST: 61 U/L — ABNORMAL HIGH (ref 15–41)
Albumin: 2.3 g/dL — ABNORMAL LOW (ref 3.5–5.0)
Alkaline Phosphatase: 68 U/L (ref 38–126)
Anion gap: 10 (ref 5–15)
BUN: 25 mg/dL — ABNORMAL HIGH (ref 8–23)
CO2: 20 mmol/L — ABNORMAL LOW (ref 22–32)
Calcium: 9 mg/dL (ref 8.9–10.3)
Chloride: 95 mmol/L — ABNORMAL LOW (ref 98–111)
Creatinine, Ser: 1.32 mg/dL — ABNORMAL HIGH (ref 0.61–1.24)
GFR, Estimated: >60 mL/min (ref >60)
Glucose, Bld: 397 mg/dL — ABNORMAL HIGH (ref 70–99)
Potassium: 4.3 mmol/L (ref 3.5–5.1)
Sodium: 125 mmol/L — ABNORMAL LOW (ref 135–145)
Total Bilirubin: 0.5 mg/dL (ref 0.3–1.2)
Total Protein: 5.8 g/dL — ABNORMAL LOW (ref 6.5–8.1)

## 2023-06-08 LAB — GLUCOSE, CAPILLARY
Glucose-Capillary: 252 mg/dL — ABNORMAL HIGH (ref 70–99)
Glucose-Capillary: 336 mg/dL — ABNORMAL HIGH (ref 70–99)
Glucose-Capillary: 367 mg/dL — ABNORMAL HIGH (ref 70–99)
Glucose-Capillary: 271 mg/dL — ABNORMAL HIGH (ref 70–99)
Glucose-Capillary: 259 mg/dL — ABNORMAL HIGH (ref 70–99)

## 2023-06-08 LAB — CBC WITH DIFFERENTIAL/PLATELET
Abs Immature Granulocytes: 0.03 10*3/uL (ref 0.00–0.07)
Basophils Absolute: 0 10*3/uL (ref 0.0–0.1)
Basophils Relative: 0 %
Eosinophils Absolute: 0.1 10*3/uL (ref 0.0–0.5)
Eosinophils Relative: 2 %
HCT: 27.5 % — ABNORMAL LOW (ref 39.0–52.0)
Hemoglobin: 9.8 g/dL — ABNORMAL LOW (ref 13.0–17.0)
Immature Granulocytes: 0 %
Lymphocytes Relative: 19 %
Lymphs Abs: 1.6 10*3/uL (ref 0.7–4.0)
MCH: 26.9 pg (ref 26.0–34.0)
MCHC: 35.6 g/dL (ref 30.0–36.0)
MCV: 75.5 fL — ABNORMAL LOW (ref 80.0–100.0)
Monocytes Absolute: 0.8 10*3/uL (ref 0.1–1.0)
Monocytes Relative: 9 %
Neutro Abs: 6 10*3/uL (ref 1.7–7.7)
Neutrophils Relative %: 70 %
Platelets: 335 10*3/uL (ref 150–400)
RBC: 3.64 MIL/uL — ABNORMAL LOW (ref 4.22–5.81)
RDW: 13.2 % (ref 11.5–15.5)
WBC: 8.5 10*3/uL (ref 4.0–10.5)
nRBC: 0 % (ref 0.0–0.2)

## 2023-06-08 LAB — MAGNESIUM: Magnesium: 1.6 mg/dL — ABNORMAL LOW (ref 1.7–2.4)

## 2023-06-08 MED ORDER — SODIUM BICARBONATE 650 MG PO TABS
650.0000 mg | ORAL_TABLET | Freq: Two times a day (BID) | ORAL | Status: DC
  Administered 2023-06-08 – 2023-06-11 (×7): 650 mg via ORAL
  Filled 2023-06-08 (×9): qty 1

## 2023-06-08 NOTE — TOC Progression Note (Addendum)
Transition of Care [facility]) - Progression Note    Patient Details  Name: Jose [name] MRN: [id number] Date of Birth: Apr 19, 1961  Transition of Care [facility]) CM/SW Contact  Meaghen Vecchiarelli A [place], [place] Phone Number: 06/08/2023, 11:56 AM  Clinical Narrative:     Update 1232 CSW was contacted back by Trinna Post at [facility] who stated that facility does not have bed available and no discharges scheduled for this week. CSW will reach out to family regarding bed offers in the [place] area and follow up with decision for SNF.   TOC will continue to follow.   CSW contacted [facility] and left message with staff with CSW contact information. CSW was informed that the Admissions director Trinna Post was in a meeting, and was told would call back CSW when available.   TOC will continue to follow.   Expected Discharge Plan: IP Rehab Facility Barriers to Discharge: Continued Medical Work up  Expected Discharge Plan and Services   Discharge Planning Services: CM Consult Post Acute Care Choice: IP Rehab Living arrangements for the past 2 months: Apartment                                       Social Determinants of Health (SDOH) Interventions SDOH Screenings   Food Insecurity: No Food Insecurity (05/28/2023)  Housing: Low Risk  (05/28/2023)  Transportation Needs: No Transportation Needs (05/28/2023)  Utilities: Not At Risk (05/28/2023)  Depression (PHQ2-9): High Risk (08/11/2022)  Social Connections: Unknown (03/05/2023)   [facility]  Tobacco Use: Medium Risk (05/30/2023)    Readmission Risk Interventions    05/29/2023    2:21 PM 04/29/2023    1:09 PM 01/29/2022    2:46 PM  Readmission Risk Prevention Plan  Transportation Screening Complete Complete Complete  PCP or Specialist Appt within 3-5 Days   Complete  HRI or Home Care Consult   Complete  Social Work Consult for Recovery Care Planning/Counseling   Complete  Palliative Care Screening   Not  Applicable  Medication Review Oceanographer) Complete Complete Complete  PCP or Specialist appointment within 3-5 days of discharge Complete    HRI or Home Care Consult Complete Complete   SW Recovery Care/Counseling Consult Complete Complete   Palliative Care Screening Not Applicable Not Applicable   Skilled Nursing Facility Complete Not Applicable

## 2023-06-08 NOTE — Inpatient Diabetes Management (Signed)
Inpatient Diabetes Program Recommendations  AACE/ADA: New Consensus Statement on Inpatient Glycemic Control (2015)  Target Ranges:  Prepandial:   less than 140 mg/dL      Peak postprandial:   less than 180 mg/dL (1-2 hours)      Critically ill patients:  140 - 180 mg/dL   Lab Results  Component Value Date   GLUCAP 252 (H) 06/08/2023   HGBA1C 7.7 (H) 04/24/2023    Review of Glycemic Control  Latest Reference Range & Units 06/06/23 20:34 06/07/23 07:43 06/07/23 11:45 06/07/23 16:15 06/07/23 20:03 06/08/23 07:18  Glucose-Capillary 70 - 99 mg/dL 132 (H) 440 (H) 102 (H) 219 (H) 264 (H) 252 (H)   Diabetes history: DM  Outpatient Diabetes medications:  Lantus 30 units daily Current orders for Inpatient glycemic control:  Novolog 0-9 units tid with meals and HS Semglee 35 units daily  Inpatient Diabetes Program Recommendations:    Consider adding Novolog 4 units tid with meals (MEAL coverage- hold if patient eats less than 50% or NPO).   Thanks,  Beryl Meager, RN, BC-ADM Inpatient Diabetes Coordinator Pager [phone]  (8a-5p)

## 2023-06-08 NOTE — Plan of Care (Signed)
  Problem: Activity: Goal: Ability to return to baseline activity level will improve Outcome: Progressing   Problem: Skin Integrity: Goal: Risk for impaired skin integrity will decrease Outcome: Progressing   Problem: Activity: Goal: Risk for activity intolerance will decrease Outcome: Progressing   Problem: Nutrition: Goal: Adequate nutrition will be maintained Outcome: Progressing   Problem: Elimination: Goal: Will not experience complications related to bowel motility Outcome: Progressing   Problem: Pain Managment: Goal: General experience of comfort will improve Outcome: Progressing   Problem: Safety: Goal: Ability to remain free from injury will improve Outcome: Progressing

## 2023-06-08 NOTE — TOC Progression Note (Signed)
Transition of Care [facility]) - Progression Note    Patient Details  Name: Jose [name] MRN: [id number] Date of Birth: 08-27-1961  [facility]) CM/SW Contact  Renessa Wellnitz A [place], [place] Phone Number: 06/08/2023, 4:17 PM  Clinical Narrative:     CSW met with pt at bedside and informed him that pt was declined at [facility] due to bed availability.  He stated he wanted CSW to reach out to [facility] and find out if they have openings for Rehab. CSW asked what other facilities pt would want in case that was not an option and he did not have an answer. CSW will follow up with pt at another opportune time and update once [facility].   TOC will continue to follow.   Expected Discharge Plan: IP Rehab Facility Barriers to Discharge: Continued Medical Work up  Expected Discharge Plan and Services   Discharge Planning Services: CM Consult Post Acute Care Choice: IP Rehab Living arrangements for the past 2 months: Apartment                                       Social Determinants of Health (SDOH) Interventions SDOH Screenings   Food Insecurity: No Food Insecurity (05/28/2023)  Housing: Low Risk  (05/28/2023)  Transportation Needs: No Transportation Needs (05/28/2023)  Utilities: Not At Risk (05/28/2023)  Depression (PHQ2-9): High Risk (08/11/2022)  Social Connections: Unknown (03/05/2023)   Received from [facility]  Tobacco Use: Medium Risk (05/30/2023)    Readmission Risk Interventions    05/29/2023    2:21 PM 04/29/2023    1:09 PM 01/29/2022    2:46 PM  Readmission Risk Prevention Plan  Transportation Screening Complete Complete Complete  PCP or Specialist Appt within 3-5 Days   Complete  HRI or Home Care Consult   Complete  Social Work Consult for Recovery Care Planning/Counseling   Complete  Palliative Care Screening   Not Applicable  Medication Review Oceanographer) Complete Complete Complete  PCP or Specialist appointment within 3-5  days of discharge Complete    HRI or Home Care Consult Complete Complete   SW Recovery Care/Counseling Consult Complete Complete   Palliative Care Screening Not Applicable Not Applicable   Skilled Nursing Facility Complete Not Applicable

## 2023-06-08 NOTE — Progress Notes (Signed)
PROGRESS NOTE    Jose [name]  ZOX:[id number] DOB: August 26, 1961 DOA: 05/27/2023 PCP: [name]  Subjective: Stable overnight. Required 1 dose of IV dilaudid to pain. Advised pt to take tylenol for mild pain in order to prevent any further nausea. Talked with pt's niece [place] yesterday. Still awaiting SNF bed offers and insurance authorization. Pt had been medically stable for discharge for several days.   Hospital Course: Jose [name] is a 62 y.o. male with medical history significant for CAD s/p PCI, COPD, CKD stage IIIb, T2DM, HTN, HLD, PAD s/p right TMA 05/12/2023 who is admitted with increasing pain and tissue loss of the right TMA site.  Seen by vascular surgery who recommended medical admission and consideration of right BKA.  HPI: Jose [name] is a 62 y.o. male with medical history significant for CAD s/p PCI, COPD, CKD stage IIIb, T2DM, HTN, HLD, PAD s/p right TMA 05/12/2023 who presented to the ED for evaluation of increasing pain and tissue loss at the right TMA site.   Patient previously underwent shockwave lithotripsy and balloon angioplasty of the right popliteal artery on 04/27/2023 by vascular surgery Dr. Randie Heinz due to gangrenous toes.  He was readmitted 6/15-6/24 due to progressive dry gangrene.  He underwent right sided transmetatarsal amputation by Dr. Edilia Bo on 05/12/2023.  There was concern for superimposed infection and patient was treated with IV Rocephin in hospital and discharged on 1 week of Augmentin and doxycycline.   Patient states that he has been having uncontrolled pain at the surgical wound site.  He ran out of his pain medications 3 days ago and return to the vascular surgery clinic today for further evaluation and for medication refill.   Patient was noted to have worsening tissue loss at the surgical wound site as well as soft BP 96/66 and mild tachycardia 108.  Patient was advised to present to the ED for evaluation in anticipation of  admission.  They discussed potential need for right below the knee amputation.   Patient states that pain seems to be easing after receiving IV narcotics while in the ED.  He denies any fevers, diaphoresis, chest pain, dyspnea.  Has had occasional chills.  He has not seen any significant discharge from his surgical wound site.  He reports seeing blood on surgical dressing when it was unwrapped in the clinic today.  He is tearful due to concern for needing further surgery.   ED Course  Labs/Imaging on admission: I have personally reviewed following labs and imaging studies.   Initial vitals showed BP 145/93, pulse 102, RR 17, temp 97.7 F, SpO2 100% on room air.   Labs showed WBC 8.0, hemoglobin 12.1, platelets 268,000, sodium 132, potassium 3.4, bicarb 15, BUN 25, creatinine 1.73, serum glucose 174, LFTs within normal limits.  Blood cultures ordered and pending collection.  Lactic acid 1.8.   Right foot x-ray shows interval transmetatarsal amputation of all 5 digits with overlying soft tissue swelling but no other acute finding.  No osseous erosion or destruction.   Patient was given IV ceftriaxone and Flagyl, IV morphine 4 mg.  Vascular surgery consulted and recommended medical admission.  The hospitalist service was consulted to admit for further evaluation and management.  Significant Events: Admitted 05-27-2023 Coreg increased to 18.75 mg bid on 06-07-2023 for better BP control  Significant Labs:   Significant Imaging Studies:   Antibiotic Therapy: Rocephin 2 g every day for 24 hours(05-29-2023 thru 05-30-2023) Flagyl 500 mg IV q12h for 24 hours(05-29-2023  thru 05-30-2023)  Procedures: Right BKA 05-30-2023  Consultants: Vascular surgery - Dr. Randie Heinz     Assessment and Plan: * Nonhealing surgical wound, initial encounter Had gangrene on admission to right foot. Now s/p right BKA on 05-30-2023 by vascular surgery.  S/P BKA (below knee amputation) unilateral, right (HCC) - on  05-30-2023 Stable. Remain with BKA protector to stump. F/u with vascular surgery.  Protein-calorie malnutrition, severe Stable.  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  CAD (coronary artery disease) On asa 81 mg and lipitor 80 mg.  COPD (chronic obstructive pulmonary disease) (HCC) Stable.  Chronic kidney disease, stage 3b (HCC) Stable.  Dyslipidemia associated with type 2 diabetes mellitus (HCC) Continue with lipitor 80 mg daily.  Type 2 diabetes mellitus with complication, with long-term current use of insulin (HCC) Stable. Continue with lantus and SSI. Increase lantus to 35 units.(From 30 units)  Hypertension associated with diabetes (HCC) Stable. BP improved on increased dose of coreg 18.75 mg bid. No ACEI/ARB due to CKD stage 3b  Chronic hyponatremia Chronic. Sodium ranges 125-134 on a regular basis.   DVT prophylaxis: SQ Heparin   Code Status: Full Code Family Communication: spoke with [place](niece) yesterday and gave her update. Disposition Plan: SNF Reason for continuing need for hospitalization: medically stable for discharge. Awaiting SNF bed offers and insurance authorization.  Objective: Vitals:   06/07/23 2018 06/08/23 0500 06/08/23 0648 06/08/23 0824  BP:   138/87 139/81  Pulse:   94 91  Resp:   16 16  Temp:   98.6 F (37 C) 98.5 F (36.9 C)  TempSrc:    Oral  SpO2: 99%  100% 100%  Weight:  70 kg    Height:        Intake/Output Summary (Last 24 hours) at 06/08/2023 1215 Last data filed at 06/08/2023 0500 Gross per 24 hour  Intake 1295 ml  Output 300 ml  Net 995 ml   Filed Weights   06/03/23 0527 06/07/23 0500 06/08/23 0500  Weight: 69.4 kg 69.8 kg 70 kg    Examination:  Physical Exam Vitals and nursing note reviewed.  Constitutional:      General: He is not in acute distress. HENT:     Head: Normocephalic and atraumatic.     Nose: Nose normal.  Cardiovascular:     Rate and Rhythm: Normal  rate and regular rhythm.  Pulmonary:     Effort: Pulmonary effort is normal.     Breath sounds: Normal breath sounds.  Abdominal:     General: Bowel sounds are normal. There is no distension.  Musculoskeletal:     Comments: Right BKA  Skin:    General: Skin is warm and dry.     Capillary Refill: Capillary refill takes less than 2 seconds.  Neurological:     Mental Status: He is alert and oriented to person, place, and time.     Data Reviewed: I have personally reviewed following labs and imaging studies  CBC: Recent Labs  Lab 06/07/23 1521 06/08/23 0802  WBC 8.9 8.5  NEUTROABS 6.4 6.0  HGB 11.3* 9.8*  HCT 31.7* 27.5*  MCV 76.6* 75.5*  PLT 337 335   Basic Metabolic Panel: Recent Labs  Lab 06/07/23 1521 06/08/23 0802  NA 130* 125*  K 5.1 4.3  CL 95* 95*  CO2 21* 20*  GLUCOSE 254* 397*  BUN 26* 25*  CREATININE 1.29* 1.32*  CALCIUM 9.6 9.0  MG 2.0 1.6*   GFR: Estimated Creatinine  Clearance: 56 mL/min (A) (by C-G formula based on SCr of 1.32 mg/dL (H)). Liver Function Tests: Recent Labs  Lab 06/07/23 1521 06/08/23 0802  AST 71* 61*  ALT 78* 78*  ALKPHOS 78 68  BILITOT 0.2* 0.5  PROT 6.6 5.8*  ALBUMIN 2.8* 2.3*   CBG: Recent Labs  Lab 06/07/23 1145 06/07/23 1615 06/07/23 2003 06/08/23 0718 06/08/23 1146  GLUCAP 351* 219* 264* 252* 336*    No results found for this or any previous visit (from the past 240 hour(s)).   Radiology Studies: No results found.   Scheduled Meds:  amitriptyline  25 mg Oral QHS   amLODipine  10 mg Oral Daily   aspirin EC  81 mg Oral Daily   atorvastatin  80 mg Oral QPM   brinzolamide  1 drop Both Eyes TID   And   brimonidine  1 drop Both Eyes TID   carvedilol  18.75 mg Oral BID WC   Chlorhexidine Gluconate Cloth  6 each Topical Q0600   famotidine  20 mg Oral Daily   feeding supplement (GLUCERNA SHAKE)  237 mL Oral TID BM   gabapentin  100 mg Oral TID   heparin  5,000 Units Subcutaneous Q8H   insulin aspart  0-5  Units Subcutaneous QHS   insulin aspart  0-9 Units Subcutaneous TID WC   insulin glargine-yfgn  35 Units Subcutaneous Daily   latanoprost  1 drop Both Eyes QHS   magnesium oxide  400 mg Oral BID   mometasone-formoterol  2 puff Inhalation BID   multivitamin  1 tablet Oral QHS   nutrition supplement (JUVEN)  1 packet Oral BID BM   polyethylene glycol  17 g Oral Daily   senna  1 tablet Oral BID   sodium bicarbonate  650 mg Oral BID   sodium chloride flush  10-40 mL Intracatheter Q12H   Continuous Infusions:   LOS: 12 days    Time spent: 31 minutes    Carollee Herter, DO  [facility]  06/08/2023, 12:15 PM

## 2023-06-08 NOTE — Progress Notes (Addendum)
PT Cancellation Note  Patient Details Name: Jose [name] MRN: [id number] DOB: [birth year]   Cancelled Treatment:    Reason Eval/Treat Not Completed: Other (comment) Patient reports stomach issues and declines all mobility at this time. Will re-attempt later if time allows.   Addendum: re-attempted at 14:30 patient continued to decline despite encouragement. Will follow back at later date.   Kinlie Janice 06/08/2023, 10:58 AM

## 2023-06-08 NOTE — Assessment & Plan Note (Signed)
Chronic. Sodium ranges 125-134 on a regular basis.

## 2023-06-09 DIAGNOSIS — E43 Unspecified severe protein-calorie malnutrition: Secondary | ICD-10-CM

## 2023-06-09 DIAGNOSIS — Z89511 Acquired absence of right leg below knee: Secondary | ICD-10-CM | POA: Diagnosis not present

## 2023-06-09 DIAGNOSIS — T8189XA Other complications of procedures, not elsewhere classified, initial encounter: Secondary | ICD-10-CM | POA: Diagnosis not present

## 2023-06-09 DIAGNOSIS — R630 Anorexia: Secondary | ICD-10-CM | POA: Diagnosis not present

## 2023-06-09 DIAGNOSIS — E118 Type 2 diabetes mellitus with unspecified complications: Secondary | ICD-10-CM | POA: Diagnosis not present

## 2023-06-09 LAB — GLUCOSE, CAPILLARY
Glucose-Capillary: 369 mg/dL — ABNORMAL HIGH (ref 70–99)
Glucose-Capillary: 388 mg/dL — ABNORMAL HIGH (ref 70–99)
Glucose-Capillary: 167 mg/dL — ABNORMAL HIGH (ref 70–99)
Glucose-Capillary: 184 mg/dL — ABNORMAL HIGH (ref 70–99)

## 2023-06-09 MED ORDER — OXYCODONE HCL 5 MG PO TABS
5.0000 mg | ORAL_TABLET | ORAL | Status: DC | PRN
  Administered 2023-06-09: 5 mg via ORAL
  Administered 2023-06-10 (×5): 10 mg via ORAL
  Administered 2023-06-11 (×4): 5 mg via ORAL
  Administered 2023-06-11: 10 mg via ORAL
  Administered 2023-06-12: 5 mg via ORAL
  Filled 2023-06-09 (×3): qty 2
  Filled 2023-06-09: qty 1
  Filled 2023-06-09: qty 2
  Filled 2023-06-09 (×2): qty 1
  Filled 2023-06-09: qty 2
  Filled 2023-06-09: qty 1
  Filled 2023-06-09: qty 2
  Filled 2023-06-09: qty 1
  Filled 2023-06-09: qty 2
  Filled 2023-06-09: qty 1

## 2023-06-09 MED ORDER — ACETAMINOPHEN 500 MG PO TABS
1000.0000 mg | ORAL_TABLET | Freq: Four times a day (QID) | ORAL | Status: DC
  Administered 2023-06-09 – 2023-06-11 (×10): 1000 mg via ORAL
  Filled 2023-06-09 (×12): qty 2

## 2023-06-09 MED ORDER — INSULIN GLARGINE-YFGN 100 UNIT/ML ~~LOC~~ SOLN
5.0000 [IU] | Freq: Once | SUBCUTANEOUS | Status: AC
  Administered 2023-06-09: 5 [IU] via SUBCUTANEOUS
  Filled 2023-06-09: qty 0.05

## 2023-06-09 MED ORDER — INSULIN GLARGINE-YFGN 100 UNIT/ML ~~LOC~~ SOLN
40.0000 [IU] | Freq: Every day | SUBCUTANEOUS | Status: DC
  Administered 2023-06-10: 40 [IU] via SUBCUTANEOUS
  Filled 2023-06-09: qty 0.4

## 2023-06-09 MED ORDER — MIRTAZAPINE 15 MG PO TABS
15.0000 mg | ORAL_TABLET | Freq: Every day | ORAL | Status: DC
  Administered 2023-06-09 – 2023-06-11 (×3): 15 mg via ORAL
  Filled 2023-06-09 (×3): qty 1

## 2023-06-09 NOTE — Plan of Care (Signed)

## 2023-06-09 NOTE — Progress Notes (Signed)
PROGRESS NOTE    Jose [name]  UJW:[id number] DOB: August 22, 1961 DOA: 05/27/2023 [facility]: [name]  Subjective: Continues to c/o poor appetite. Pt on regular diet. Start remeron today. [facility] is full. No bed offer made.   Hospital Course: Jose [name] is a 62 y.o. male with medical history significant for CAD s/p PCI, COPD, CKD stage IIIb, T2DM, HTN, HLD, PAD s/p right TMA 05/12/2023 who is admitted with increasing pain and tissue loss of the right TMA site.  Seen by vascular surgery who recommended medical admission and consideration of right BKA.  HPI: Jose [name] is a 62 y.o. male with medical history significant for CAD s/p PCI, COPD, CKD stage IIIb, T2DM, HTN, HLD, PAD s/p right TMA 05/12/2023 who presented to the ED for evaluation of increasing pain and tissue loss at the right TMA site.   Patient previously underwent shockwave lithotripsy and balloon angioplasty of the right popliteal artery on 04/27/2023 by vascular surgery Dr. Randie Heinz due to gangrenous toes.  He was readmitted 6/15-6/24 due to progressive dry gangrene.  He underwent right sided transmetatarsal amputation by Dr. Edilia Bo on 05/12/2023.  There was concern for superimposed infection and patient was treated with IV Rocephin in hospital and discharged on 1 week of Augmentin and doxycycline.   Patient states that he has been having uncontrolled pain at the surgical wound site.  He ran out of his pain medications 3 days ago and return to the vascular surgery clinic today for further evaluation and for medication refill.   Patient was noted to have worsening tissue loss at the surgical wound site as well as soft BP 96/66 and mild tachycardia 108.  Patient was advised to present to the ED for evaluation in anticipation of admission.  They discussed potential need for right below the knee amputation.   Patient states that pain seems to be easing after receiving IV narcotics while in the ED.  He denies  any fevers, diaphoresis, chest pain, dyspnea.  Has had occasional chills.  He has not seen any significant discharge from his surgical wound site.  He reports seeing blood on surgical dressing when it was unwrapped in the clinic today.  He is tearful due to concern for needing further surgery.   ED Course  Labs/Imaging on admission: I have personally reviewed following labs and imaging studies.   Initial vitals showed BP 145/93, pulse 102, RR 17, temp 97.7 F, SpO2 100% on room air.   Labs showed WBC 8.0, hemoglobin 12.1, platelets 268,000, sodium 132, potassium 3.4, bicarb 15, BUN 25, creatinine 1.73, serum glucose 174, LFTs within normal limits.  Blood cultures ordered and pending collection.  Lactic acid 1.8.   Right foot x-ray shows interval transmetatarsal amputation of all 5 digits with overlying soft tissue swelling but no other acute finding.  No osseous erosion or destruction.   Patient was given IV ceftriaxone and Flagyl, IV morphine 4 mg.  Vascular surgery consulted and recommended medical admission.  The hospitalist service was consulted to admit for further evaluation and management.  Significant Events: Admitted 05-27-2023 Coreg increased to 18.75 mg bid on 06-07-2023 for better BP control 06-09-2023. Started remeron 15 mg for appetite. Started scheduled tylenol 1000 mg q6h. Pt turned down by [facility]. Other SNF bed search started.  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 Randie Heinz     Assessment and Plan: * Nonhealing surgical wound, initial encounter Had gangrene on admission to right foot. Now s/p right BKA on 05-30-2023 by vascular surgery.  S/P BKA (below knee amputation) unilateral, right (HCC) - on 05-30-2023 Stable. Remain with BKA protector to  stump. F/u with vascular surgery.  Protein-calorie malnutrition, severe Stable.  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  CAD (coronary artery disease) On asa 81 mg and lipitor 80 mg.  COPD (chronic obstructive pulmonary disease) (HCC) Stable.  Chronic kidney disease, stage 3b (HCC) Stable.  Dyslipidemia associated with type 2 diabetes mellitus (HCC) Continue with lipitor 80 mg daily.  Type 2 diabetes mellitus with complication, with long-term current use of insulin (HCC) Stable. Continue with lantus and SSI. AM CBG 369.  Increase lantus to 40 units.(From 35 units)  Hypertension associated with diabetes (HCC) Stable. BP improved on increased dose of coreg 18.75 mg bid. No ACEI/ARB due to CKD stage 3b  No appetite Start remeron 15 mg at bedtime. Start calorie count.  Chronic hyponatremia Chronic. Sodium ranges 125-134 on a regular basis.   DVT prophylaxis: heparin injection 5,000 Units Start: 05/31/23 0600   Code Status: Full Code Family Communication: no family at bedside. Attempted to call pt's niece Ms. Winferd Humphrey. No answer Disposition Plan: SNF Reason for continuing need for hospitalization: medically stable for discharge.  Objective: Vitals:   06/08/23 1619 06/08/23 2010 06/09/23 0441 06/09/23 0758  BP: 139/76 119/80 138/81 139/80  Pulse: 83 75 81 83  Resp: 16   17  Temp: 98.6 F (37 C) 98.5 F (36.9 C) 98.7 F (37.1 C) 98.5 F (36.9 C)  TempSrc: Oral Oral Oral   SpO2: 100% 100% 97% 96%  Weight:      Height:        Intake/Output Summary (Last 24 hours) at 06/09/2023 0852 Last data filed at 06/09/2023 0444 Gross per 24 hour  Intake 237 ml  Output 400 ml  Net -163 ml   Filed Weights   06/03/23 0527 06/07/23 0500 06/08/23 0500  Weight: 69.4 kg 69.8 kg 70 kg    Examination:  Physical Exam Vitals and nursing note reviewed.  Constitutional:      General: He is not in acute distress. HENT:     Head:  Normocephalic and atraumatic.     Nose: Nose normal.  Cardiovascular:     Rate and Rhythm: Normal rate and regular rhythm.  Pulmonary:     Effort: Pulmonary effort is normal.     Breath sounds: Normal breath sounds.  Abdominal:     General: Bowel sounds are normal. There is no distension.  Musculoskeletal:     Comments: Right BKA  Skin:    General: Skin is warm and dry.     Capillary Refill: Capillary refill takes less than 2 seconds.  Neurological:     Mental Status: He is alert and oriented to person, place, and time.     Data Reviewed: I have personally reviewed following labs and imaging studies  CBC: Recent Labs  Lab 06/07/23 1521 06/08/23 0802  WBC 8.9 8.5  NEUTROABS 6.4 6.0  HGB 11.3* 9.8*  HCT 31.7* 27.5*  MCV 76.6* 75.5*  PLT 337 335   Basic Metabolic Panel: Recent Labs  Lab 06/07/23 1521 06/08/23 0802  NA 130* 125*  K 5.1 4.3  CL 95* 95*  CO2 21* 20*  GLUCOSE 254*  397*  BUN 26* 25*  CREATININE 1.29* 1.32*  CALCIUM 9.6 9.0  MG 2.0 1.6*   GFR: Estimated Creatinine Clearance: 56 mL/min (A) (by C-G formula based on SCr of 1.32 mg/dL (H)). Liver Function Tests: Recent Labs  Lab 06/07/23 1521 06/08/23 0802  AST 71* 61*  ALT 78* 78*  ALKPHOS 78 68  BILITOT 0.2* 0.5  PROT 6.6 5.8*  ALBUMIN 2.8* 2.3*   CBG: Recent Labs  Lab 06/08/23 1146 06/08/23 1239 06/08/23 1638 06/08/23 2106 06/09/23 0800  GLUCAP 336* 367* 271* 259* 369*    No results found for this or any previous visit (from the past 240 hour(s)).   Radiology Studies: No results found.   Scheduled Meds:  acetaminophen  1,000 mg Oral Q6H   amitriptyline  25 mg Oral QHS   amLODipine  10 mg Oral Daily   aspirin EC  81 mg Oral Daily   atorvastatin  80 mg Oral QPM   brinzolamide  1 drop Both Eyes TID   And   brimonidine  1 drop Both Eyes TID   carvedilol  18.75 mg Oral BID WC   Chlorhexidine Gluconate Cloth  6 each Topical Q0600   famotidine  20 mg Oral Daily   feeding  supplement (GLUCERNA SHAKE)  237 mL Oral TID BM   gabapentin  100 mg Oral TID   heparin  5,000 Units Subcutaneous Q8H   insulin aspart  0-5 Units Subcutaneous QHS   insulin aspart  0-9 Units Subcutaneous TID WC   [START ON 06/10/2023] insulin glargine-yfgn  40 Units Subcutaneous Daily   insulin glargine-yfgn  5 Units Subcutaneous Once   latanoprost  1 drop Both Eyes QHS   magnesium oxide  400 mg Oral BID   mirtazapine  15 mg Oral QHS   mometasone-formoterol  2 puff Inhalation BID   multivitamin  1 tablet Oral QHS   nutrition supplement (JUVEN)  1 packet Oral BID BM   polyethylene glycol  17 g Oral Daily   senna  1 tablet Oral BID   sodium bicarbonate  650 mg Oral BID   sodium chloride flush  10-40 mL Intracatheter Q12H   Continuous Infusions:   LOS: 13 days    Time spent: 35 minutes   Carollee Herter, DO  [facility]  06/09/2023, 8:52 AM

## 2023-06-09 NOTE — Progress Notes (Signed)
Occupational Therapy Treatment Patient Details Name: Jose [name] MRN: [id number] DOB: 02/02/1961 Today's Date: 06/09/2023   History of present illness Pt is 62 y.o. 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 PMH - CAD, CKD, HTN, DM, COPD   OT comments  Pt demonstrating fair progress toward therapy goals. Pt agreeable to EOB/OOB activity with max encouragement this session. Pt presents with continued c/o R residual limb pain, fatigue and decreased balance. Session focused on functional transfers and ADLs. Limb protector off upon arrival, pt able to don at bed level with setup A. Pt completed grooming tasks while seated EOB with good tolerance. Pt performed STS transfer and lateral hops to HOB using RW with min guard A. Pt would benefit from continued acute OT services to maximize functional independence and facilitate transition to skilled inpatient follow up therapy, <3 hours/day.    Recommendations for follow up therapy are one component of a multi-disciplinary discharge planning process, led by the attending physician.  Recommendations may be updated based on patient status, additional functional criteria and insurance authorization.    Assistance Recommended at Discharge Frequent or constant Supervision/Assistance  Patient can return home with the following  A little help with walking and/or transfers;A little help with bathing/dressing/bathroom;Assistance with cooking/housework;Assist for transportation;Help with stairs or ramp for entrance   Equipment Recommendations  Other (comment) (defer)    Recommendations for Other Services      Precautions / Restrictions Precautions Precautions: Fall Precaution Comments: R BKA Required Braces or Orthoses: Other Brace Other Brace: limb protector Restrictions Weight Bearing Restrictions: Yes RLE Weight Bearing: Non weight bearing       Mobility Bed  Mobility Overal bed mobility: Needs Assistance Bed Mobility: Supine to Sit, Sit to Supine     Supine to sit: HOB elevated, Supervision Sit to supine: Supervision, HOB elevated   General bed mobility comments: use of bed rails    Transfers Overall transfer level: Needs assistance Equipment used: Rolling walker (2 wheels) Transfers: Sit to/from Stand Sit to Stand: Min guard           General transfer comment: STS transfer from EOB using RW with min guard A. Side hops toward HOB using RW with min guard A.     Balance Overall balance assessment: Needs assistance Sitting-balance support: No upper extremity supported, Feet supported Sitting balance-Leahy Scale: Good Sitting balance - Comments: sitting EOB   Standing balance support: Bilateral upper extremity supported, During functional activity, Reliant on assistive device for balance Standing balance-Leahy Scale: Poor Standing balance comment: Reliant on UE support                           ADL either performed or assessed with clinical judgement   ADL Overall ADL's : Needs assistance/impaired     Grooming: Oral care;Set up;Sitting Grooming Details (indicate cue type and reason): seated EOB             Lower Body Dressing: Set up;Bed level Lower Body Dressing Details (indicate cue type and reason): Pt donned R limb protector while long sitting in bed with setup A Toilet Transfer: Min guard;Stand-pivot;BSC/3in1;Rolling walker (2 wheels) Toilet Transfer Details (indicate cue type and reason): simulated         Functional mobility during ADLs: Min guard;Rolling walker (2 wheels) (stand pivot) General ADL Comments: limited secondary to pain and motivation    Extremity/Trunk Assessment  Upper Extremity Assessment Upper Extremity Assessment: Overall WFL for tasks assessed   Lower Extremity Assessment Lower Extremity Assessment: Defer to PT evaluation        Vision   Vision Assessment?: No apparent  visual deficits   Perception Perception Perception: Not tested   Praxis Praxis Praxis: Not tested    Cognition Arousal/Alertness: Awake/alert Behavior During Therapy: WFL for tasks assessed/performed Overall Cognitive Status: Within Functional Limits for tasks assessed                                 General Comments: Participatory with encouragement        Exercises      Shoulder Instructions       General Comments VSS on RA    Pertinent Vitals/ Pain       Pain Assessment Pain Assessment: Faces Faces Pain Scale: Hurts little more Pain Location: R residual limb Pain Descriptors / Indicators: Discomfort, Grimacing, Guarding, Operative site guarding Pain Intervention(s): Limited activity within patient's tolerance, Monitored during session  Home Living                                          Prior Functioning/Environment              Frequency  Min 2X/week        Progress Toward Goals  OT Goals(current goals can now be found in the care plan section)  Progress towards OT goals: Progressing toward goals  Acute Rehab OT Goals Patient Stated Goal: to brush teeth OT Goal Formulation: With patient Time For Goal Achievement: 06/15/23 Potential to Achieve Goals: Good ADL Goals Pt Will Perform Grooming: with supervision;standing Pt Will Perform Lower Body Bathing: with modified independence;sitting/lateral leans Pt Will Perform Upper Body Dressing: with modified independence;sitting Pt Will Perform Lower Body Dressing: with modified independence;sitting/lateral leans Pt Will Transfer to Toilet: stand pivot transfer;with modified independence;bedside commode Pt Will Perform Toileting - Clothing Manipulation and hygiene: with modified independence;sitting/lateral leans  Plan Discharge plan remains appropriate;Frequency remains appropriate    Co-evaluation                 AM-PAC OT "6 Clicks" Daily Activity      Outcome Measure   Help from another person eating meals?: None Help from another person taking care of personal grooming?: A Little Help from another person toileting, which includes using toliet, bedpan, or urinal?: A Little Help from another person bathing (including washing, rinsing, drying)?: A Little Help from another person to put on and taking off regular upper body clothing?: A Little Help from another person to put on and taking off regular lower body clothing?: A Little 6 Click Score: 19    End of Session Equipment Utilized During Treatment: Rolling walker (2 wheels)  OT Visit Diagnosis: Unsteadiness on feet (R26.81);Pain;Muscle weakness (generalized) (M62.81)   Activity Tolerance Patient tolerated treatment well   Patient Left in bed;with call bell/phone within reach   Nurse Communication Mobility status        Time: 6578-4696 OT Time Calculation (min): 14 min  Charges: OT General Charges $OT Visit: 1 Visit OT Treatments $Therapeutic Activity: 8-22 mins  Sherley Bounds, OTS Acute Rehabilitation Services Office [phone] Secure Chat Communication Preferred   Sherley Bounds 06/09/2023, 5:12 PM

## 2023-06-09 NOTE — Assessment & Plan Note (Addendum)
Start remeron 15 mg at bedtime. Start calorie count.

## 2023-06-09 NOTE — Progress Notes (Signed)
Nutrition Follow-up  DOCUMENTATION CODES:   Severe malnutrition in context of chronic illness  INTERVENTION:  - Continue Glucerna Shake po TID, each supplement provides 220 kcal and 10 grams of protein  - Continue -1 packet Juven BID, each packet provides 95 calories, 2.5 grams of protein (collagen), and 9.8 grams of carbohydrate (3 grams sugar); also contains 7 grams of L-arginine and L-glutamine, 300 mg vitamin C, 15 mg vitamin E, 1.2 mcg vitamin B-12, 9.5 mg zinc, 200 mg calcium, and 1.5 g  Calcium Beta-hydroxy-Beta-methylbutyrate to support wound healing  - Continue Rena-vit.   - Only meeting 40% of energy requirements with PO intakes, discussed need for potential nutrition support with MD. If it aligns with pt's goals of care.   NUTRITION DIAGNOSIS:   Severe Malnutrition related to acute illness as evidenced by energy intake < or equal to 50% for > or equal to 5 days, percent weight loss.  GOAL:   Patient will meet greater than or equal to 90% of their needs  MONITOR:   PO intake  REASON FOR ASSESSMENT:   Malnutrition Screening Tool    ASSESSMENT:   62 y.o. male admits related to pain at right transmetatarsal amputation site. PMH includes: polysubstance abuse, PVD, CAD s/p stents, stage 3 CKD, COPD, DM, HTN, HLD. Pt is currently receiving medical management related to nonhealing surgical wound.  Meds reviewed: lipitor, pepcid, sliding scale insulin, semglee, mag-ox, remeron, rena-vit, Juven, miralax, senokot, sodium bicarbonate. Labs reviewed: Na low, chloride low, BUN/Creatinine high. FS BG 186-336 mg/dL.   Pt reports that his appetite is improving. He does report that he still only wants bites of food. Pt reports that he is drinking his Glucerna shakes. There was only one ticket from yesterday with amounts documented. Pt is not meeting his needs at this time. Will message MD about plan for nutrition moving forward. Pt has remeron ordered. Pt meeting roughly 40% of needs  with PO intakes. RD will continue to monitor POC. Will discuss need for nutrition support with MD, if this aligns with the patient's GOC.   Pt is awaiting discharge to SNF.   Calorie Count Note  48 hour calorie count ordered.  Breakfast: 140 kcals, 5 gm protein Lunch: 0% Dinner: 207 kcals, 19 gm protein  Supplements: 440 kcals, 20 gm protein   Total intake: 787 kcal (37% of minimum estimated needs)  44 protein (41% of minimum estimated needs)  Diet Order:   Diet Order             Diet regular Room service appropriate? Yes; Fluid consistency: Thin  Diet effective now                   EDUCATION NEEDS:   Not appropriate for education at this time  Skin:  Skin Integrity Issues:: Incisions Incisions: R leg  Last BM:  7/16  Height:   Ht Readings from Last 1 Encounters:  05/27/23 5\' 11"  (1.803 m)    Weight:   Wt Readings from Last 1 Encounters:  06/11/23 70.9 kg    Ideal Body Weight:     BMI:  Body mass index is 21.8 kg/m.  Estimated Nutritional Needs:   Kcal:  2100-2500 kcals  Protein:  105-125 gm  Fluid:  2.1-2.5 L  Bethann Humble, RD, LDN, CNSC.

## 2023-06-10 DIAGNOSIS — L03818 Cellulitis of other sites: Secondary | ICD-10-CM | POA: Diagnosis not present

## 2023-06-10 DIAGNOSIS — I2510 Atherosclerotic heart disease of native coronary artery without angina pectoris: Secondary | ICD-10-CM | POA: Diagnosis not present

## 2023-06-10 DIAGNOSIS — T8189XA Other complications of procedures, not elsewhere classified, initial encounter: Secondary | ICD-10-CM | POA: Diagnosis not present

## 2023-06-10 DIAGNOSIS — Z89511 Acquired absence of right leg below knee: Secondary | ICD-10-CM | POA: Diagnosis not present

## 2023-06-10 LAB — COMPREHENSIVE METABOLIC PANEL
ALT: 79 U/L — ABNORMAL HIGH (ref 0–44)
AST: 42 U/L — ABNORMAL HIGH (ref 15–41)
Albumin: 2.6 g/dL — ABNORMAL LOW (ref 3.5–5.0)
Alkaline Phosphatase: 76 U/L (ref 38–126)
Anion gap: 11 (ref 5–15)
BUN: 36 mg/dL — ABNORMAL HIGH (ref 8–23)
CO2: 20 mmol/L — ABNORMAL LOW (ref 22–32)
Calcium: 9.5 mg/dL (ref 8.9–10.3)
Chloride: 96 mmol/L — ABNORMAL LOW (ref 98–111)
Creatinine, Ser: 1.41 mg/dL — ABNORMAL HIGH (ref 0.61–1.24)
GFR, Estimated: 56 mL/min — ABNORMAL LOW (ref >60)
Glucose, Bld: 233 mg/dL — ABNORMAL HIGH (ref 70–99)
Potassium: 5 mmol/L (ref 3.5–5.1)
Sodium: 127 mmol/L — ABNORMAL LOW (ref 135–145)
Total Bilirubin: 0.3 mg/dL (ref 0.3–1.2)
Total Protein: 6 g/dL — ABNORMAL LOW (ref 6.5–8.1)

## 2023-06-10 LAB — GLUCOSE, CAPILLARY
Glucose-Capillary: 231 mg/dL — ABNORMAL HIGH (ref 70–99)
Glucose-Capillary: 336 mg/dL — ABNORMAL HIGH (ref 70–99)
Glucose-Capillary: 150 mg/dL — ABNORMAL HIGH (ref 70–99)
Glucose-Capillary: 186 mg/dL — ABNORMAL HIGH (ref 70–99)

## 2023-06-10 LAB — PREALBUMIN: Prealbumin: 19 mg/dL (ref 18–38)

## 2023-06-10 MED ORDER — INSULIN GLARGINE-YFGN 100 UNIT/ML ~~LOC~~ SOLN
45.0000 [IU] | Freq: Every day | SUBCUTANEOUS | Status: DC
  Administered 2023-06-11: 45 [IU] via SUBCUTANEOUS
  Filled 2023-06-10 (×2): qty 0.45

## 2023-06-10 MED ORDER — INSULIN GLARGINE-YFGN 100 UNIT/ML ~~LOC~~ SOLN
5.0000 [IU] | Freq: Once | SUBCUTANEOUS | Status: AC
  Administered 2023-06-10: 5 [IU] via SUBCUTANEOUS
  Filled 2023-06-10: qty 0.05

## 2023-06-10 MED ORDER — INSULIN ASPART 100 UNIT/ML IJ SOLN
4.0000 [IU] | Freq: Three times a day (TID) | INTRAMUSCULAR | Status: DC
  Administered 2023-06-10 – 2023-06-12 (×5): 4 [IU] via SUBCUTANEOUS

## 2023-06-10 NOTE — Inpatient Diabetes Management (Signed)
Inpatient Diabetes Program Recommendations  AACE/ADA: New Consensus Statement on Inpatient Glycemic Control (2015)  Target Ranges:  Prepandial:   less than 140 mg/dL      Peak postprandial:   less than 180 mg/dL (1-2 hours)      Critically ill patients:  140 - 180 mg/dL   Lab Results  Component Value Date   GLUCAP 231 (H) 06/10/2023   HGBA1C 7.7 (H) 04/24/2023    Review of Glycemic Control  Latest Reference Range & Units 06/08/23 21:06 06/09/23 08:00 06/09/23 11:45 06/09/23 17:01 06/09/23 19:58 06/10/23 08:02  Glucose-Capillary 70 - 99 mg/dL 130 (H) 865 (H) 784 (H) 167 (H) 184 (H) 231 (H)   Diabetes history: DM  Outpatient Diabetes medications:  Lantus 30 units daily Current orders for Inpatient glycemic control:  Novolog 0-9 units tid with meals and HS Semglee 40 units daily  Inpatient Diabetes Program Recommendations:    May consider adding Novolog meal coverage 3 units tid with meals (Hold if patient eats less than 50% or NPO).   Thanks,  Beryl Meager, RN, BC-ADM Inpatient Diabetes Coordinator Pager [phone]  (8a-5p)

## 2023-06-10 NOTE — Plan of Care (Signed)

## 2023-06-10 NOTE — Progress Notes (Signed)
Physical Therapy Treatment Patient Details Name: Jose [name] MRN: [id number] DOB: [birth year] Today's Date: 06/10/2023   History of Present Illness Pt is 62 y.o. 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 PMH - CAD, CKD, HTN, DM, COPD    PT Comments  Pt greeted resting in bed and agreeable to bed level exercises this session to maintain strength, ROM, and prevent contracture.  Pt with fair tolerance for RLE exercises with cues for technique and hands on assist for increased ROM. Pt declining transfers OOB or EOB this session. Encouraged pt to be up OOB as tolerated and educated pt on benefits with pt verbalizing understanding. Pt with good carryover of education on importance of maintaining R knee extension at rest. Pt continues to benefit from skilled PT services to progress toward functional mobility goals.      Assistance Recommended at Discharge Intermittent Supervision/Assistance  If plan is discharge home, recommend the following:  Can travel by private vehicle    A lot of help with walking and/or transfers;A little help with bathing/dressing/bathroom;Assistance with cooking/housework;Assist for transportation;Help with stairs or ramp for entrance      Equipment Recommendations  Wheelchair (measurements PT);Wheelchair cushion (measurements PT)    Recommendations for Other Services       Precautions / Restrictions Precautions Precautions: Fall Precaution Comments: R BKA Required Braces or Orthoses: Other Brace Other Brace: limb protector Restrictions RLE Weight Bearing: Non weight bearing     Mobility  Bed Mobility Overal bed mobility: Needs Assistance Bed Mobility: Supine to Sit           General bed mobility comments: able to come to long sitting in bed without assist    Transfers                   General transfer comment: declining OOB this session     Ambulation/Gait                   Stairs             Wheelchair Mobility     Tilt Bed    Modified Rankin (Stroke Patients Only)       Balance Overall balance assessment: Needs assistance Sitting-balance support: No upper extremity supported, Feet supported Sitting balance-Leahy Scale: Good Sitting balance - Comments: sitting EOB   Standing balance support: Bilateral upper extremity supported, During functional activity, Reliant on assistive device for balance Standing balance-Leahy Scale: Poor Standing balance comment: Reliant on UE support                            Cognition Arousal/Alertness: Awake/alert Behavior During Therapy: WFL for tasks assessed/performed Overall Cognitive Status: Within Functional Limits for tasks assessed                                 General Comments: Participatory with encouragement        Exercises Amputee Exercises Quad Sets: AROM, Left, 10 reps, Supine (with 2 seconds hold) Hip ABduction/ADduction: AROM, Right, 10 reps Straight Leg Raises: AROM, Right, 10 reps Chair Push Up: Strengthening, 10 reps    General Comments General comments (skin integrity, edema, etc.): VSS on RA      Pertinent Vitals/Pain Pain Assessment Pain Assessment: Faces Faces Pain Scale: Hurts a little bit Pain Location: R  residual limb Pain Descriptors / Indicators: Discomfort, Grimacing, Guarding, Operative site guarding Pain Intervention(s): Premedicated before session, Monitored during session, Limited activity within patient's tolerance    Home Living                          Prior Function            PT Goals (current goals can now be found in the care plan section) Acute Rehab PT Goals Patient Stated Goal: to manage pain PT Goal Formulation: With patient Time For Goal Achievement: 06/14/23 Progress towards PT goals: Progressing toward goals    Frequency    Min 3X/week      PT  Plan      Co-evaluation              AM-PAC PT "6 Clicks" Mobility   Outcome Measure  Help needed turning from your back to your side while in a flat bed without using bedrails?: A Little Help needed moving from lying on your back to sitting on the side of a flat bed without using bedrails?: A Little Help needed moving to and from a bed to a chair (including a wheelchair)?: A Lot Help needed standing up from a chair using your arms (e.g., wheelchair or bedside chair)?: A Little Help needed to walk in hospital room?: A Lot Help needed climbing 3-5 steps with a railing? : Total 6 Click Score: 14    End of Session   Activity Tolerance: Patient tolerated treatment well Patient left: in bed;with call bell/phone within reach Nurse Communication: Mobility status PT Visit Diagnosis: Unsteadiness on feet (R26.81);Other abnormalities of gait and mobility (R26.89);Muscle weakness (generalized) (M62.81);Difficulty in walking, not elsewhere classified (R26.2);Pain Pain - Right/Left: Right Pain - part of body: Leg     Time: 4098-1191 PT Time Calculation (min) (ACUTE ONLY): 12 min  Charges:    $Therapeutic Exercise: 8-22 mins PT General Charges $$ ACUTE PT VISIT: 1 Visit                     Elantra Caprara R. PTA Acute Rehabilitation Services Office: [phone]   Catalina Antigua 06/10/2023, 4:16 PM

## 2023-06-10 NOTE — TOC Progression Note (Signed)
Transition of Care [facility]) - Progression Note    Patient Details  Name: Oshea Percival MRN: [id number] Date of Birth: May 11, 1961  [facility] CM/SW Contact  Kaylib Furness A [place], [place] Phone Number: 06/10/2023, 12:26 PM  Clinical Narrative:     CSW faxed [facility] Screening Checklist and fax to ti [place] at [phone] . CSW to hear back regarding recommendations for SNF with [facility] covered facilities.  CSW to present list to pt and pt's niece and make selection from bed offers for SNF.   TOC will continue to follow.   Expected Discharge Plan: IP Rehab Facility Barriers to Discharge: Continued Medical Work up  Expected Discharge Plan and Services   Discharge Planning Services: CM Consult Post Acute Care Choice: IP Rehab Living arrangements for the past 2 months: Apartment                                       Social Determinants of Health (SDOH) Interventions SDOH Screenings   Food Insecurity: No Food Insecurity (05/28/2023)  Housing: Low Risk  (05/28/2023)  Transportation Needs: No Transportation Needs (05/28/2023)  Utilities: Not At Risk (05/28/2023)  Depression (PHQ2-9): High Risk (08/11/2022)  Social Connections: Unknown (03/05/2023)   Received from [facility]  Tobacco Use: Medium Risk (05/30/2023)    Readmission Risk Interventions    05/29/2023    2:21 PM 04/29/2023    1:09 PM 01/29/2022    2:46 PM  Readmission Risk Prevention Plan  Transportation Screening Complete Complete Complete  PCP or Specialist Appt within 3-5 Days   Complete  HRI or Home Care Consult   Complete  Social Work Consult for Recovery Care Planning/Counseling   Complete  Palliative Care Screening   Not Applicable  Medication Review Oceanographer) Complete Complete Complete  PCP or Specialist appointment within 3-5 days of discharge Complete    HRI or Home Care Consult Complete Complete   SW Recovery Care/Counseling Consult Complete Complete   Palliative Care  Screening Not Applicable Not Applicable   Skilled Nursing Facility Complete Not Applicable

## 2023-06-10 NOTE — Progress Notes (Signed)
[facility]                                                                              Jose [name], is a 62 y.o. male, DOB - 02-11-1961, ZOX:[id number] Admit date - 05/27/2023    Outpatient Primary MD for the patient is [name]  LOS - 14  days  Chief Complaint  Patient presents with   Foot Pain    Infection        Brief summary   Patient is a 62 year old male with CAD s/p PCI, COPD, CKD stage IIIb, T2DM, HTN, HLD, PAD s/p right TMA 05/12/2023 who is admitted with increasing pain and tissue loss of the right TMA site. Seen by vascular surgery who recommended medical admission and consideration of right BKA.  Patient underwent right BKA on 05/30/2023, awaiting SNF  Assessment & Plan    Principal Problem:   Nonhealing surgical wound, initial encounter -Vascular surgery was consulted. -Patient had gangrene on admission to the right foot, status post right BKA on 05/30/2023   Active Problems:   S/P BKA (below knee amputation) unilateral, right (HCC) - on 05-30-2023 -Stable. -Continue with BKA protector to stump. F/u with vascular surgery.    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   CAD (coronary artery disease) -Continue aspirin, statin   COPD (chronic obstructive pulmonary disease) (HCC) -No wheezing, stable    Chronic kidney disease, stage 3b (HCC) -Baseline creatinine 1.4-1.7 -Creatinine stable   Dyslipidemia associated with type 2 diabetes mellitus (HCC) Continue with lipitor 80 mg daily.   Type 2 diabetes mellitus with complication, with long-term current use of insulin (HCC) -Hemoglobin A1c 7.7 on 04/24/2023 CBG (last 3)  Recent Labs    06/09/23 1958 06/10/23 0802 06/10/23 1152  GLUCAP 184* 231* 336*   -CBGs uncontrolled, increase Semglee to 45 units daily -Add meal coverage NovoLog 4 units 3 times daily AC, continue SSI     Hypertension associated with diabetes  (HCC) Stable. -Continue Coreg - No ACEI/ARB due to CKD stage 3b   No appetite Start remeron 15 mg at bedtime. Start calorie count.   Chronic hyponatremia Chronic.  -Sodium ranges 125-134 on a regular basis.   Severe protein calorie malnutrition -Etiology: acute illness Signs/Symptoms: energy intake < or equal to 50% for > or equal to 5 days, percent weight loss Percent weight loss: 14 % (in 1 month) Interventions: MVI, Glucerna shake Estimated body mass index is 21.55 kg/m as calculated from the following:   Height as of this encounter: 5\' 11"  (1.803 m).   Weight as of this encounter: 70.1 kg.  Code Status: Full code DVT Prophylaxis:  heparin injection 5,000 Units Start: 05/31/23 0600   Level of Care: Level of care: Med-Surg Family Communication: Updated patient Disposition Plan:      Remains inpatient appropriate:      Procedures:   right BKA on 05/30/2023  Consultants:   Vascular surgery  Antimicrobials:   Anti-infectives (From admission, onward)    Start     Dose/Rate Route Frequency Ordered Stop   05/27/23 2030  cefTRIAXone (ROCEPHIN) 2 g in  sodium chloride 0.9 % 100 mL IVPB       Placed in "And" Linked Group   2 g 200 mL/hr over 30 Minutes Intravenous Every 24 hours 05/27/23 2029 05/30/23 2052   05/27/23 2030  metroNIDAZOLE (FLAGYL) IVPB 500 mg       Placed in "And" Linked Group   500 mg 100 mL/hr over 60 Minutes Intravenous Every 12 hours 05/27/23 2029 05/31/23 0858          Medications  acetaminophen  1,000 mg Oral Q6H   amitriptyline  25 mg Oral QHS   amLODipine  10 mg Oral Daily   aspirin EC  81 mg Oral Daily   atorvastatin  80 mg Oral QPM   brinzolamide  1 drop Both Eyes TID   And   brimonidine  1 drop Both Eyes TID   carvedilol  18.75 mg Oral BID WC   Chlorhexidine Gluconate Cloth  6 each Topical Q0600   famotidine  20 mg Oral Daily   feeding supplement (GLUCERNA SHAKE)  237 mL Oral TID BM   gabapentin  100 mg Oral TID   heparin  5,000  Units Subcutaneous Q8H   insulin aspart  0-5 Units Subcutaneous QHS   insulin aspart  0-9 Units Subcutaneous TID WC   insulin glargine-yfgn  40 Units Subcutaneous Daily   latanoprost  1 drop Both Eyes QHS   magnesium oxide  400 mg Oral BID   mirtazapine  15 mg Oral QHS   mometasone-formoterol  2 puff Inhalation BID   multivitamin  1 tablet Oral QHS   nutrition supplement (JUVEN)  1 packet Oral BID BM   polyethylene glycol  17 g Oral Daily   senna  1 tablet Oral BID   sodium bicarbonate  650 mg Oral BID   sodium chloride flush  10-40 mL Intracatheter Q12H      Subjective:   Jose [name] was seen and examined today.  No acute complaints.  Tolerating diet.  Awaiting SNF.  Patient denies dizziness, chest pain, shortness of breath, abdominal pain, N/V. Objective:   Vitals:   06/09/23 1956 06/10/23 0350 06/10/23 0400 06/10/23 0759  BP: 124/76 139/76  120/66  Pulse: 76 76  83  Resp: 16 16  16   Temp: 98.1 F (36.7 C) 98 F (36.7 C)  98.9 F (37.2 C)  TempSrc: Oral Oral    SpO2: 100% 99%  98%  Weight:   70.1 kg   Height:        Intake/Output Summary (Last 24 hours) at 06/10/2023 1326 Last data filed at 06/10/2023 1203 Gross per 24 hour  Intake 594 ml  Output 1300 ml  Net -706 ml     Wt Readings from Last 3 Encounters:  06/10/23 70.1 kg  05/23/23 81.6 kg  05/09/23 81.6 kg     Exam General: Alert and oriented x 3, NAD Cardiovascular: S1 S2 auscultated,  RRR Respiratory: Clear to auscultation bilaterally, no wheezing, rales or rhonchi Gastrointestinal: Soft, nontender, nondistended, + bowel sounds Ext: right BKA, with stump protector Neuro: no new FND's Psych: Normal affect     Data Reviewed:  I have personally reviewed following labs    CBC Lab Results  Component Value Date   WBC 8.5 06/08/2023   RBC 3.64 (L) 06/08/2023   HGB 9.8 (L) 06/08/2023   HCT 27.5 (L) 06/08/2023   MCV 75.5 (L) 06/08/2023   MCH 26.9 06/08/2023   PLT 335 06/08/2023   MCHC 35.6  06/08/2023   RDW  13.2 06/08/2023   LYMPHSABS 1.6 06/08/2023   MONOABS 0.8 06/08/2023   EOSABS 0.1 06/08/2023   BASOSABS 0.0 06/08/2023     Last metabolic panel Lab Results  Component Value Date   NA 127 (L) 06/10/2023   K 5.0 06/10/2023   CL 96 (L) 06/10/2023   CO2 20 (L) 06/10/2023   BUN 36 (H) 06/10/2023   CREATININE 1.41 (H) 06/10/2023   GLUCOSE 233 (H) 06/10/2023   GFRNONAA 56 (L) 06/10/2023   GFRAA 53 (L) 12/14/2020   CALCIUM 9.5 06/10/2023   PHOS 3.7 04/29/2023   PROT 6.0 (L) 06/10/2023   ALBUMIN 2.6 (L) 06/10/2023   BILITOT 0.3 06/10/2023   ALKPHOS 76 06/10/2023   AST 42 (H) 06/10/2023   ALT 79 (H) 06/10/2023   ANIONGAP 11 06/10/2023    CBG (last 3)  Recent Labs    06/09/23 1958 06/10/23 0802 06/10/23 1152  GLUCAP 184* 231* 336*      Coagulation Profile: No results for input(s): "INR", "PROTIME" in the last 168 hours.   Radiology Studies: I have personally reviewed the imaging studies  No results found.     Thad Ranger M.D. [facility] 06/10/2023, 1:26 PM  Available via Epic secure chat 7am-7pm After 7 pm, please refer to night coverage provider listed on amion.

## 2023-06-10 NOTE — TOC Progression Note (Signed)
Transition of Care [facility]) - Progression Note    Patient Details  Name: Jose [name] MRN: [id number] Date of Birth: 03-24-1961  [facility] of [facility]) CM/SW Contact  Kenroy Timberman A [place], [place] Phone Number: 06/10/2023, 4:42 PM  Clinical Narrative:     CSW was contacted by pt's niece, [name].  CSW updated her regarding pt's denial to [facility]. CSW provided bed offer update on facilities that have selected pt, [facility] and that [facility] was considering and planned to update CSW.   She said she would look at facilities and get back to CSW regarding decision in the morning and requested that if she could send two additional places for referral. CSW informed her that decision would need to be made tomorrow and if facilities update CSW in a timely manner, it could  be a possibility. She stated that was ok and would keep CSW updated with decision.   TOC will continue to follow.    Expected Discharge Plan: IP Rehab Facility Barriers to Discharge: Continued Medical Work up  Expected Discharge Plan and Services   Discharge Planning Services: CM Consult Post Acute Care Choice: IP Rehab Living arrangements for the past 2 months: Apartment                                       Social Determinants of Health (SDOH) Interventions SDOH Screenings   Food Insecurity: No Food Insecurity (05/28/2023)  Housing: Low Risk  (05/28/2023)  Transportation Needs: No Transportation Needs (05/28/2023)  Utilities: Not At Risk (05/28/2023)  Depression (PHQ2-9): High Risk (08/11/2022)  Social Connections: Unknown (03/05/2023)   Received from [facility], [facility]  Tobacco Use: Medium Risk (05/30/2023)    Readmission Risk Interventions    05/29/2023    2:21 PM 04/29/2023    1:09 PM 01/29/2022    2:46 PM  Readmission Risk Prevention Plan  Transportation Screening Complete Complete Complete  PCP or Specialist Appt within 3-5 Days   Complete  HRI or Home Care Consult   Complete  Social Work  Consult for Recovery Care Planning/Counseling   Complete  Palliative Care Screening   Not Applicable  Medication Review Oceanographer) Complete Complete Complete  PCP or Specialist appointment within 3-5 days of discharge Complete    HRI or Home Care Consult Complete Complete   SW Recovery Care/Counseling Consult Complete Complete   Palliative Care Screening Not Applicable Not Applicable   Skilled Nursing Facility Complete Not Applicable

## 2023-06-10 NOTE — TOC Progression Note (Signed)
Transition of Care [facility]) - Progression Note    Patient Details  Name: Jose [name] MRN: [id number] Date of Birth: Jan 22, 1961  Transition of Care [facility]) CM/SW Contact  Chanee Henrickson A [place], [place] Phone Number: 06/10/2023, 4:19 PM  Clinical Narrative:     CSW met with pt at bedside to discuss SNF decision.   CSW stated that they had not received any information from [place] regarding the option for [facility].   CSW informed him that pt was faxed out to other facilities that are in the [place] area that would be somewhat close to pt's family.   He said that he would be ok with that and just wants to get to a facility and be able to discharge.   He said that his niece is the main one helping pt make decisions and wants CSW to reach out regarding update.   CSW proceeded to follow up with pt's niece, [name] by phone. There was no answer. CSW left voicemail with contact information to reach back out to CSW. CSW plan to update on current bed offers and make decision for bed tomorrow, insurance authorization and DC as pt is medically stable.   TOC will continue to follow.   Expected Discharge Plan: IP Rehab Facility Barriers to Discharge: Continued Medical Work up  Expected Discharge Plan and Services   Discharge Planning Services: CM Consult Post Acute Care Choice: IP Rehab Living arrangements for the past 2 months: Apartment                                       Social Determinants of Health (SDOH) Interventions SDOH Screenings   Food Insecurity: No Food Insecurity (05/28/2023)  Housing: Low Risk  (05/28/2023)  Transportation Needs: No Transportation Needs (05/28/2023)  Utilities: Not At Risk (05/28/2023)  Depression (PHQ2-9): High Risk (08/11/2022)  Social Connections: Unknown (03/05/2023)   [facility], [facility]  Tobacco Use: Medium Risk (05/30/2023)    Readmission Risk Interventions    05/29/2023    2:21 PM 04/29/2023    1:09 PM 01/29/2022     2:46 PM  Readmission Risk Prevention Plan  Transportation Screening Complete Complete Complete  PCP or Specialist Appt within 3-5 Days   Complete  HRI or Home Care Consult   Complete  Social Work Consult for Recovery Care Planning/Counseling   Complete  Palliative Care Screening   Not Applicable  Medication Review Oceanographer) Complete Complete Complete  PCP or Specialist appointment within 3-5 days of discharge Complete    HRI or Home Care Consult Complete Complete   SW Recovery Care/Counseling Consult Complete Complete   Palliative Care Screening Not Applicable Not Applicable   Skilled Nursing Facility Complete Not Applicable

## 2023-06-11 DIAGNOSIS — I2510 Atherosclerotic heart disease of native coronary artery without angina pectoris: Secondary | ICD-10-CM | POA: Diagnosis not present

## 2023-06-11 DIAGNOSIS — Z89511 Acquired absence of right leg below knee: Secondary | ICD-10-CM | POA: Diagnosis not present

## 2023-06-11 DIAGNOSIS — L03818 Cellulitis of other sites: Secondary | ICD-10-CM | POA: Diagnosis not present

## 2023-06-11 DIAGNOSIS — T8189XA Other complications of procedures, not elsewhere classified, initial encounter: Secondary | ICD-10-CM | POA: Diagnosis not present

## 2023-06-11 LAB — BASIC METABOLIC PANEL
Anion gap: 8 (ref 5–15)
BUN: 33 mg/dL — ABNORMAL HIGH (ref 8–23)
CO2: 22 mmol/L (ref 22–32)
Calcium: 9.1 mg/dL (ref 8.9–10.3)
Chloride: 99 mmol/L (ref 98–111)
Creatinine, Ser: 1.47 mg/dL — ABNORMAL HIGH (ref 0.61–1.24)
GFR, Estimated: 53 mL/min — ABNORMAL LOW (ref >60)
Glucose, Bld: 95 mg/dL (ref 70–99)
Potassium: 4.7 mmol/L (ref 3.5–5.1)
Sodium: 129 mmol/L — ABNORMAL LOW (ref 135–145)

## 2023-06-11 LAB — CBC
HCT: 26 % — ABNORMAL LOW (ref 39.0–52.0)
Hemoglobin: 9.1 g/dL — ABNORMAL LOW (ref 13.0–17.0)
MCH: 26.8 pg (ref 26.0–34.0)
MCHC: 35 g/dL (ref 30.0–36.0)
MCV: 76.5 fL — ABNORMAL LOW (ref 80.0–100.0)
Platelets: 349 10*3/uL (ref 150–400)
RBC: 3.4 MIL/uL — ABNORMAL LOW (ref 4.22–5.81)
RDW: 13.3 % (ref 11.5–15.5)
WBC: 8 10*3/uL (ref 4.0–10.5)
nRBC: 0 % (ref 0.0–0.2)

## 2023-06-11 LAB — GLUCOSE, CAPILLARY
Glucose-Capillary: 137 mg/dL — ABNORMAL HIGH (ref 70–99)
Glucose-Capillary: 123 mg/dL — ABNORMAL HIGH (ref 70–99)
Glucose-Capillary: 138 mg/dL — ABNORMAL HIGH (ref 70–99)
Glucose-Capillary: 136 mg/dL — ABNORMAL HIGH (ref 70–99)

## 2023-06-11 MED ORDER — MIRTAZAPINE 15 MG PO TABS: 15.0000 mg | ORAL_TABLET | Freq: Every day | ORAL | 0 refills | Status: AC

## 2023-06-11 MED ORDER — SODIUM BICARBONATE 650 MG PO TABS: 650.0000 mg | ORAL_TABLET | Freq: Two times a day (BID) | ORAL | Status: DC

## 2023-06-11 MED ORDER — OXYCODONE-ACETAMINOPHEN 5-325 MG PO TABS: 1.0000 | ORAL_TABLET | ORAL | 0 refills | Status: DC | PRN

## 2023-06-11 MED ORDER — MIRTAZAPINE 15 MG PO TABS: 15.0000 mg | ORAL_TABLET | Freq: Every day | ORAL | 0 refills | Status: DC

## 2023-06-11 MED ORDER — SIMETHICONE 40 MG/0.6ML PO SUSP: 40.0000 mg | Freq: Four times a day (QID) | ORAL | Status: DC | PRN

## 2023-06-11 MED ORDER — SENNOSIDES-DOCUSATE SODIUM 8.6-50 MG PO TABS: 1.0000 | ORAL_TABLET | Freq: Two times a day (BID) | ORAL | Status: AC

## 2023-06-11 MED ORDER — CARVEDILOL 12.5 MG PO TABS: 18.7500 mg | ORAL_TABLET | Freq: Two times a day (BID) | ORAL | Status: DC

## 2023-06-11 MED ORDER — INSULIN ASPART 100 UNIT/ML IJ SOLN: 4.0000 [IU] | Freq: Three times a day (TID) | INTRAMUSCULAR | Status: DC

## 2023-06-11 MED ORDER — FAMOTIDINE 20 MG PO TABS: 20.0000 mg | ORAL_TABLET | Freq: Every day | ORAL | Status: DC

## 2023-06-11 MED ORDER — INSULIN ASPART 100 UNIT/ML IJ SOLN: 0.0000 [IU] | Freq: Three times a day (TID) | INTRAMUSCULAR | Status: DC

## 2023-06-11 MED ORDER — BISACODYL 5 MG PO TBEC: 5.0000 mg | DELAYED_RELEASE_TABLET | Freq: Every day | ORAL | Status: DC | PRN

## 2023-06-11 MED ORDER — LATANOPROST 0.005 % OP SOLN: 1.0000 [drp] | Freq: Every day | OPHTHALMIC | Status: AC

## 2023-06-11 MED ORDER — GABAPENTIN 100 MG PO CAPS: 100.0000 mg | ORAL_CAPSULE | Freq: Three times a day (TID) | ORAL | Status: DC

## 2023-06-11 MED ORDER — INSULIN GLARGINE 100 UNIT/ML ~~LOC~~ SOLN: 45.0000 [IU] | Freq: Every day | SUBCUTANEOUS | Status: DC

## 2023-06-11 NOTE — TOC Progression Note (Addendum)
Transition of Care [facility]) - Progression Note    Patient Details  Name: Jose [name] MRN: [id number] Date of Birth: Oct 30, 1961  Transition of Care [facility]) CM/SW Contact  Francella Barnett A [place], [place] Phone Number: 06/11/2023, 12:34 PM  Clinical Narrative:     CSW scheduled Safe Transport for pt to [facility] for 9:00am at [facility] Friday 06/11/23.   TOC will continue to follow.   Update 1325 CSW received call back from [place] at [place] stating that pt has the offered bed. Pt to arrive to the facility by 10am Friday morning.   Number for report: [phone] [id number].   Address to facility: [address], [place], [address].  [address], [employer] [facility] in the [employer].   CSW then followed up with pt's niece to update her regarding discharge and pt's placement. There was no answer. CSW left voicemail with contact information to reach back out to CSW.   Provider notified of early DC Friday morning by 9am. CSW to schedule PTAR at 9am for transportation.   TOC will continue to follow.   CSW received voice mail from [place], [place], [phone] ext [phone]  regarding bed availability for pt at [facility] bed. Pt has to arrive by 10am Friday 7/19.   CSW met with pt at bedside to discuss bed offer. He stated that he would want to take it as that was his first choice.   CSW then contacted Lurena Joiner at the Clinic, to update them regarding acceptance of bed offer. There was no answer. CSW left HIPAA compliant voice message informing them that pt accepted bed offer.  CSW will follow up at another time today regarding acceptance and possible dc date.   TOC will continue to follow.    Expected Discharge Plan: IP Rehab Facility Barriers to Discharge: Continued Medical Work up  Expected Discharge Plan and Services   Discharge Planning Services: CM Consult Post Acute Care Choice: IP Rehab Living arrangements for the past 2 months:  Apartment                                       Social Determinants of Health (SDOH) Interventions SDOH Screenings   Food Insecurity: No Food Insecurity (05/28/2023)  Housing: Low Risk  (05/28/2023)  Transportation Needs: No Transportation Needs (05/28/2023)  Utilities: Not At Risk (05/28/2023)  Depression (PHQ2-9): High Risk (08/11/2022)  Social Connections: Unknown (03/05/2023)   Received from [facility], [facility]  Tobacco Use: Medium Risk (05/30/2023)    Readmission Risk Interventions    05/29/2023    2:21 PM 04/29/2023    1:09 PM 01/29/2022    2:46 PM  Readmission Risk Prevention Plan  Transportation Screening Complete Complete Complete  PCP or Specialist Appt within 3-5 Days   Complete  HRI or Home Care Consult   Complete  Social Work Consult for Recovery Care Planning/Counseling   Complete  Palliative Care Screening   Not Applicable  Medication Review Oceanographer) Complete Complete Complete  PCP or Specialist appointment within 3-5 days of discharge Complete    HRI or Home Care Consult Complete Complete   SW Recovery Care/Counseling Consult Complete Complete   Palliative Care Screening Not Applicable Not Applicable   Skilled Nursing Facility Complete Not Applicable

## 2023-06-11 NOTE — Plan of Care (Signed)
  Problem: Activity: Goal: Ability to return to baseline activity level will improve Outcome: Progressing   Problem: Education: Goal: Ability to describe self-care measures that may prevent or decrease complications (Diabetes Survival Skills Education) will improve Outcome: Progressing   Problem: Nutritional: Goal: Maintenance of adequate nutrition will improve Outcome: Progressing   Problem: Skin Integrity: Goal: Risk for impaired skin integrity will decrease Outcome: Progressing   Problem: Activity: Goal: Risk for activity intolerance will decrease Outcome: Progressing   Problem: Safety: Goal: Ability to remain free from injury will improve Outcome: Progressing   Problem: Pain Managment: Goal: General experience of comfort will improve Outcome: Progressing

## 2023-06-11 NOTE — Discharge Summary (Addendum)
Physician Discharge Summary   Patient: Jose [name] MRN: [id number] DOB: December 24, 1960  Admit date:     05/27/2023  Discharge date: 06/12/23  Discharge Physician: Thad Ranger, MD    PCP: [name]   Recommendations at discharge:    Outpatient follow-up with vascular surgery in 4 weeks for wound check, suture removal and prosthetic evaluation   Discharge Diagnoses:    Nonhealing surgical wound, initial encounter   S/P BKA (below knee amputation) unilateral, right (HCC) - on 05-30-2023   Hypertension associated with diabetes (HCC)   Type 2 diabetes mellitus with complication, with long-term current use of insulin (HCC)   Dyslipidemia associated with type 2 diabetes mellitus (HCC)   Chronic kidney disease, stage 3b (HCC)   COPD (chronic obstructive pulmonary disease) (HCC)   CAD (coronary artery disease)   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 Seen by vascular surgery who recommended medical admission and consideration of right BKA.  Patient underwent right BKA on 05/30/2023, awaiting SNF   Assessment and Plan: * Nonhealing surgical wound, initial encounter Had gangrene on admission to right foot. Now s/p right BKA on 05-30-2023 by vascular surgery.  S/P BKA (below knee amputation) unilateral, right (HCC) - on 05-30-2023 Stable. Remain with BKA protector to stump. F/u with vascular surgery.  Protein-calorie malnutrition, severe Stable.  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  CAD (coronary artery disease) On asa 81 mg and lipitor 80 mg.  COPD (chronic obstructive pulmonary disease) (HCC) Stable.  Chronic kidney disease, stage 3b  (HCC) Stable.  Dyslipidemia associated with type 2 diabetes mellitus (HCC) Continue with lipitor 80 mg daily.  Type 2 diabetes mellitus with complication, with long-term current use of insulin (HCC) Stable. Continue with lantus and SSI. AM CBG 369.  Increase lantus to 40 units.(From 35 units)  Hypertension associated with diabetes (HCC) Stable. BP improved on increased dose of coreg 18.75 mg bid. No ACEI/ARB due to CKD stage 3b  No appetite Start remeron 15 mg at bedtime. Start calorie count.  Chronic hyponatremia Chronic. Sodium ranges 125-134 on a regular basis.   Nonhealing surgical wound, initial encounter -Vascular surgery was consulted. -Patient had gangrene on admission to the right foot, status post right BKA on 05/30/2023 - BKA stump looks healing well, need outpatient follow-up with vascular surgery in 4 weeks       S/P BKA (below knee amputation) unilateral, right (HCC) - on 05-30-2023 -Stable. -Continue with BKA protector to stump.   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   CAD (coronary artery disease) -Continue aspirin, statin   COPD (chronic obstructive pulmonary disease) (HCC) -No wheezing, stable    Chronic kidney disease, stage 3b (HCC) -Baseline creatinine 1.4-1.7 -Creatinine stable   Dyslipidemia associated with type 2 diabetes mellitus (HCC) Continue with lipitor 80 mg daily.   Type 2 diabetes mellitus with complication, with long-term current use of insulin (HCC) -Hemoglobin A1c 7.7 on 04/24/2023 -CBG now better controlled, continue current regimen with Semglee 45 units daily, NovoLog meal coverage 4 units 3 times daily AC, SSI  Hypertension associated with diabetes (HCC) Stable. -Continue Coreg - No ACEI/ARB due to CKD stage 3b   No appetite Started remeron 15 mg at bedtime.    Chronic hyponatremia Chronic.  -Sodium ranges 125-134 on a regular  basis.     Severe protein calorie malnutrition -Etiology: acute illness Signs/Symptoms: energy intake < or equal to 50% for > or equal to 5 days, percent weight loss Percent weight loss: 14 % (in 1 month) Interventions: MVI, Glucerna shake Estimated body mass index is 21.8 kg/m as calculated from the following:   Height as of this encounter: 5\' 11"  (1.803 m).   Weight as of this encounter: 70.9 kg.      Pain control - [employer] Controlled Substance Reporting System database was reviewed. and patient was instructed, not to drive, operate heavy machinery, perform activities at heights, swimming or participation in water activities or provide baby-sitting services while on Pain, Sleep and Anxiety Medications; until their outpatient Physician has advised to do so again. Also recommended to not to take more than prescribed Pain, Sleep and Anxiety Medications.  Consultants: vascular surgery  Procedures performed: right BKA   Disposition: Skilled nursing facility Diet recommendation: carb modified diet   DISCHARGE MEDICATION: Allergies as of 06/12/2023       Reactions   Lisinopril-hydrochlorothiazide Swelling, Other (See Comments)   Causes swelling of lips   Simvastatin Other (See Comments)   ALT (SGPT) level raised, Aspartate aminotransferase serum level raised        Medication List     STOP taking these medications    Alogliptin Benzoate 25 MG Tabs   furosemide 20 MG tablet Commonly known as: LASIX   hydrALAZINE 100 MG tablet Commonly known as: APRESOLINE   methocarbamol 500 MG tablet Commonly known as: ROBAXIN       TAKE these medications    acetaminophen 325 MG tablet Commonly known as: TYLENOL Take 650 mg by mouth every 4 (four) hours as needed for mild pain, moderate pain or headache.   albuterol 108 (90 Base) MCG/ACT inhaler Commonly known as: VENTOLIN HFA Inhale 2 puffs into the lungs every 6 (six) hours as needed for wheezing or shortness of  breath.   amitriptyline 25 MG tablet Commonly known as: ELAVIL Take 1 tablet (25 mg total) by mouth at bedtime.   amLODipine 10 MG tablet Commonly known as: NORVASC Take 1 tablet (10 mg total) by mouth daily.   aspirin EC 81 MG tablet Take 81 mg by mouth daily. Swallow whole.   atorvastatin 80 MG tablet Commonly known as: LIPITOR Take 1 tablet (80 mg total) by mouth daily. What changed: when to take this   bisacodyl 5 MG EC tablet Commonly known as: DULCOLAX Take 1 tablet (5 mg total) by mouth daily as needed for moderate constipation.   Brinzolamide-Brimonidine 1-0.2 % Susp Place 1 drop into both eyes 3 (three) times daily.   carvedilol 12.5 MG tablet Commonly known as: COREG Take 1.5 tablets (18.75 mg total) by mouth 2 (two) times daily with a meal. What changed: how much to take   clopidogrel 75 MG tablet Commonly known as: PLAVIX Take 1 tablet (75 mg total) by mouth daily with breakfast.   cyanocobalamin 500 MCG tablet Commonly known as: VITAMIN B12 Take 500 mcg by mouth daily.   Dextrose-Fructose-Sod Citrate 4.35-4.17-0.921 GM/15ML Liqd Take 15 cm by mouth as needed (15cm (1 tube) by mouth as needed for hypoglycemia for  glucose less than 70, repeat every 15 minutes if blood  sugar less than 70.).   famotidine 20 MG tablet Commonly known as: PEPCID Take 1 tablet (20 mg total) by mouth daily.   Fish Oil 1000 MG Caps Take 1,000 mg by mouth 2 (two) times daily.   fluticasone-salmeterol 500-50 MCG/ACT Aepb Commonly known as: ADVAIR Inhale 1 puff into the lungs in the morning and at bedtime.   folic acid 1 MG tablet Commonly known as: FOLVITE Take 1 tablet (1 mg total) by mouth daily.   gabapentin 100 MG capsule Commonly known as: NEURONTIN Take 1 capsule (100 mg total) by mouth 3 (three) times daily.   hydrOXYzine 25 MG tablet Commonly known as: ATARAX Take 1 tablet (25 mg total) by mouth 3 (three) times daily as needed for itching or anxiety.   insulin  aspart 100 UNIT/ML injection Commonly known as: novoLOG Inject 4 Units into the skin 3 (three) times daily with meals.   insulin aspart 100 UNIT/ML injection Commonly known as: novoLOG Inject 0-9 Units into the skin 3 (three) times daily with meals. Sliding scale CBG 70 - 120: 0 units CBG 121 - 150: 1 unit,  CBG 151 - 200: 2 units,  CBG 201 - 250: 3 units,  CBG 251 - 300: 5 units,  CBG 301 - 350: 7 units,  CBG 351 - 400: 9 units   CBG > 400: 9 units and notify your MD   insulin glargine 100 UNIT/ML injection Commonly known as: LANTUS Inject 0.45 mLs (45 Units total) into the skin daily. What changed: how much to take   ipratropium-albuterol 0.5-2.5 (3) MG/3ML Soln Commonly known as: DUONEB Take 3 mLs by nebulization every 6 (six) hours as needed (for shortness of breath and wheezing).   latanoprost 0.005 % ophthalmic solution Commonly known as: XALATAN Place 1 drop into both eyes at bedtime.   Magnesium Oxide 420 MG Tabs Take 420 mg by mouth daily.   mirtazapine 15 MG tablet Commonly known as: REMERON Take 1 tablet (15 mg total) by mouth at bedtime.   multivitamin with minerals Tabs tablet Take 1 tablet by mouth daily.   naloxone 4 MG/0.1ML Liqd nasal spray kit Commonly known as: NARCAN Place 1 spray into the nose once. Instill 1 spray into one nostril as directed for opioid overdose, call 911 immediately, administer dose, then turn person on side if no response in 2-3 minutes or person responds but relapses. Repeat using a new spray device and into the other nostril.   nitroGLYCERIN 0.4 MG SL tablet Commonly known as: NITROSTAT Place 1 tablet (0.4 mg total) under the tongue every 5 (five) minutes as needed for chest pain.   oxyCODONE-acetaminophen 5-325 MG tablet Commonly known as: PERCOCET/ROXICET Take 1 tablet by mouth every 4 (four) hours as needed for severe pain. What changed: when to take this   polyethylene glycol 17 g packet Commonly known as: MIRALAX /  GLYCOLAX Take 17 g by mouth 2 (two) times daily. What changed:  when to take this reasons to take this   senna-docusate 8.6-50 MG tablet Commonly known as: Senokot-S Take 1 tablet by mouth 2 (two) times daily for 14 days.   simethicone 40 MG/0.6ML drops Commonly known as: MYLICON Take 0.6 mLs (40 mg total) by mouth every 6 (six) hours as needed for flatulence.   sodium bicarbonate 650 MG tablet Take 1 tablet (650 mg total) by mouth 2 (two) times daily.   tadalafil 10 MG tablet Commonly known as: Cialis Take 1 tablet (10 mg total) by mouth daily  as needed for erectile dysfunction.   thiamine 100 MG tablet Commonly known as: Vitamin B-1 Take 1 tablet (100 mg total) by mouth daily.   UltiCare Insulin Syringe 30G X 1/2" 0.5 ML Misc Generic drug: Insulin Syringe-Needle U-100 Use with Lantus        Follow-up Information     [facility] Follow up in 4 week(s).   Specialty: Vascular Surgery Why: Office will call you to arrange your appt (sent). Need appt for wound check and staples removal, prsothetic. Contact information: [address] [place] [address] [phone]        [facility] Schedule an appointment as soon as possible for a visit in 2 week(s).   Why: for hospital follow-up Contact information: [address] [facility] [place] [address] [phone]                Discharge Exam: Filed Weights   06/10/23 0400 06/11/23 0500 06/12/23 0506  Weight: 70.1 kg 70.9 kg 70 kg   S: doing well, no complaints. Looking forward to discharge today.     BP 121/75 (BP Location: Left Arm)   Pulse 77   Temp 98.5 F (36.9 C) (Oral)   Resp 15   Ht 5\' 11"  (1.803 m)   Wt 70 kg   SpO2 98%   BMI 21.52 kg/m    Physical Exam General: Alert and oriented x 3, NAD Cardiovascular: S1 S2 clear, RRR.  Respiratory: CTAB, no wheezing, rales or rhonchi Gastrointestinal: Soft,  nontender, nondistended, NBS Ext: right BKA  Neuro: no new deficits Psych: Normal affect    Condition at discharge: fair  The results of significant diagnostics from this hospitalization (including imaging, microbiology, ancillary and laboratory) are listed below for reference.   Imaging Studies: DG Foot Complete Right  Result Date: 05/27/2023 CLINICAL DATA:  Recent toe amputation, pain.  Possible infection. EXAM: RIGHT FOOT COMPLETE - 3+ VIEW COMPARISON:  Foot radiographs 04/24/2023. FINDINGS: There has been interval transmetatarsal amputation of all 5 digits. The amputation margins are smooth. There is no cortical irregularity or osseous erosion/destruction. There is soft tissue swelling overlying the amputation site. Extensive vascular calcifications are noted. Postsurgical changes are partially imaged in the tibia. There is mild inferior calcaneal spurring and Achilles enthesopathy. IMPRESSION: Interval transmetatarsal amputation of all 5 digits with overlying soft tissue swelling but no other acute finding. No osseous erosion or destruction. Electronically Signed   By: Lesia Hausen M.D.   On: 05/27/2023 15:48   DG Chest 2 View  Result Date: 05/23/2023 CLINICAL DATA:  Shortness of breath.  Dizziness. EXAM: CHEST - 2 VIEW COMPARISON:  10/10/2022 FINDINGS: The heart size and mediastinal contours are within normal limits. Both lungs are clear. The visualized skeletal structures are unremarkable. IMPRESSION: No active cardiopulmonary disease. Electronically Signed   By: Danae Orleans M.D.   On: 05/23/2023 17:21   MR ABDOMEN WO CONTRAST  Result Date: 05/14/2023 CLINICAL DATA:  Pancreatic tail mass suspected EXAM: MRI ABDOMEN WITHOUT CONTRAST TECHNIQUE: Multiplanar multisequence MR imaging was performed without the administration of intravenous contrast. COMPARISON:  CT angiogram runoff, 04/24/2023 CT abdomen, 04/27/2019 FINDINGS: Examination is limited by lack of intravenous contrast. Lower chest:  No acute abnormality.  Cardiomegaly. Hepatobiliary: No obvious solid liver abnormality is seen. No gallstones, gallbladder wall thickening, or biliary dilatation. Pancreas: Multiseptated, complex cystic lesion of the tip of the pancreatic tail measuring 3.4 x 2.1 cm. This is unchanged in comparison to prior examinations  dating back to 04/27/2019 (series 3, image 27). No pancreatic ductal dilatation or surrounding inflammatory changes. Spleen: Normal in size without significant abnormality. Adrenals/Urinary Tract: Adrenal glands are unremarkable. Kidneys are normal, without renal calculi, obvious solid lesion, or hydronephrosis. Stomach/Bowel: Stomach is within normal limits. No evidence of bowel wall thickening, distention, or inflammatory changes. Vascular/Lymphatic: No significant vascular findings are present. No enlarged abdominal lymph nodes. Other: No abdominal wall hernia or abnormality. No ascites. Musculoskeletal: No acute or significant osseous findings. IMPRESSION: 1. Multiseptated, complex cystic lesion of the tip of the pancreatic tail measuring 3.4 x 2.1 cm. This is unchanged in comparison to prior examinations dating back to 04/27/2019. 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 No pancreatic ductal dilatation or surrounding inflammatory changes. Although well-established stability is very reassuring for benign etiology, given size and complexity, consider additional follow-up in 1-2 years, or alternately EUS/FNA for definitive tissue diagnosis. 2. Cardiomegaly. Electronically Signed   By: Jearld Lesch M.D.   On: 05/14/2023 10:11    Microbiology: Results for orders placed or performed during the hospital encounter of 05/27/23  Blood Cultures x 2 sites     Status: None   Collection Time: 05/27/23 10:25 PM   Specimen: BLOOD  Result Value Ref Range Status   Specimen Description BLOOD SITE NOT SPECIFIED  Final   Special Requests   Final    BOTTLES DRAWN  AEROBIC AND ANAEROBIC Blood Culture adequate volume   Culture   Final    NO GROWTH 5 DAYS Performed at [facility], [address], [place], [place] [address]    Report Status 06/01/2023 FINAL  Final  Blood Cultures x 2 sites     Status: None   Collection Time: 05/28/23  1:08 AM   Specimen: BLOOD LEFT HAND  Result Value Ref Range Status   Specimen Description BLOOD LEFT HAND  Final   Special Requests   Final    BOTTLES DRAWN AEROBIC AND ANAEROBIC Blood Culture adequate volume   Culture   Final    NO GROWTH 5 DAYS Performed at [facility], [address] [place], [place] [address]    Report Status 06/02/2023 FINAL  Final    Labs: CBC: Recent Labs  Lab 06/07/23 1521 06/08/23 0802 06/11/23 1519  WBC 8.9 8.5 8.0  NEUTROABS 6.4 6.0  --   HGB 11.3* 9.8* 9.1*  HCT 31.7* 27.5* 26.0*  MCV 76.6* 75.5* 76.5*  PLT 337 335 349   Basic Metabolic Panel: Recent Labs  Lab 06/07/23 1521 06/08/23 0802 06/10/23 0039 06/11/23 1519  NA 130* 125* 127* 129*  K 5.1 4.3 5.0 4.7  CL 95* 95* 96* 99  CO2 21* 20* 20* 22  GLUCOSE 254* 397* 233* 95  BUN 26* 25* 36* 33*  CREATININE 1.29* 1.32* 1.41* 1.47*  CALCIUM 9.6 9.0 9.5 9.1  MG 2.0 1.6*  --   --    Liver Function Tests: Recent Labs  Lab 06/07/23 1521 06/08/23 0802 06/10/23 0039  AST 71* 61* 42*  ALT 78* 78* 79*  ALKPHOS 78 68 76  BILITOT 0.2* 0.5 0.3  PROT 6.6 5.8* 6.0*  ALBUMIN 2.8* 2.3* 2.6*   CBG: Recent Labs  Lab 06/10/23 2029 06/11/23 0751 06/11/23 1137 06/11/23 1651 06/11/23 2059  GLUCAP 186* 137* 123* 138* 136*    Discharge time spent: greater than 30 minutes.  Signed: Thad Ranger, MD [facility] 06/12/2023

## 2023-06-11 NOTE — Progress Notes (Signed)
[facility]                                                                              Jose [name], is a 62 y.o. male, DOB - Oct 02, 1961, WGN:[id number] Admit date - 05/27/2023    Outpatient Primary MD [facility] the patient is [name]  LOS - 15  days  Chief Complaint  Patient presents with   Foot Pain    Infection        Brief summary   Patient is a 62 year old male with CAD s/p PCI, COPD, CKD stage IIIb, T2DM, HTN, HLD, PAD s/p right TMA 05/12/2023 who is admitted with increasing pain and tissue loss of the right TMA site. Seen by vascular surgery who recommended medical admission and consideration of right BKA.  Patient underwent right BKA on 05/30/2023, awaiting SNF  Assessment & Plan    Principal Problem:   Nonhealing surgical wound, initial encounter -Vascular surgery was consulted. -Patient had gangrene on admission to the right foot, status post right BKA on 05/30/2023 -Examined, BKA stump looks healing well   Active Problems:   S/P BKA (below knee amputation) unilateral, right (HCC) - on 05-30-2023 -Stable. -Continue with BKA protector to stump.   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   CAD (coronary artery disease) -Continue aspirin, statin   COPD (chronic obstructive pulmonary disease) (HCC) -No wheezing, stable    Chronic kidney disease, stage 3b (HCC) -Baseline creatinine 1.4-1.7 -Creatinine stable   Dyslipidemia associated with type 2 diabetes mellitus (HCC) Continue with lipitor 80 mg daily.   Type 2 diabetes mellitus with complication, with long-term current use of insulin (HCC) -Hemoglobin A1c 7.7 on 04/24/2023 CBG (last 3)  Recent Labs    06/10/23 2029 06/11/23 0751 06/11/23 1137  GLUCAP 186* 137* 123*   -CBG now better controlled, continue current regimen with Semglee 45 units daily, NovoLog meal coverage 4 units 3 times daily  AC, SSI      Hypertension associated with diabetes (HCC) Stable. -Continue Coreg - No ACEI/ARB due to CKD stage 3b   No appetite Start remeron 15 mg at bedtime. Start calorie count.   Chronic hyponatremia Chronic.  -Sodium ranges 125-134 on a regular basis.   Severe protein calorie malnutrition -Etiology: acute illness Signs/Symptoms: energy intake < or equal to 50% for > or equal to 5 days, percent weight loss Percent weight loss: 14 % (in 1 month) Interventions: MVI, Glucerna shake Estimated body mass index is 21.8 kg/m as calculated from the following:   Height as of this encounter: 5\' 11"  (1.803 m).   Weight as of this encounter: 70.9 kg.  Code Status: Full code DVT Prophylaxis:  heparin injection 5,000 Units Start: 05/31/23 0600   Level of Care: Level of care: Med-Surg Family Communication: Updated patient Disposition Plan:      Remains inpatient appropriate:   Plan to DC to SNF in a.m.   Procedures:   right BKA on 05/30/2023  Consultants:   Vascular surgery  Antimicrobials:   Anti-infectives (From admission, onward)    Start  Dose/Rate Route Frequency Ordered Stop   05/27/23 2030  cefTRIAXone (ROCEPHIN) 2 g in sodium chloride 0.9 % 100 mL IVPB       Placed in "And" Linked Group   2 g 200 mL/hr over 30 Minutes Intravenous Every 24 hours 05/27/23 2029 05/30/23 2052   05/27/23 2030  metroNIDAZOLE (FLAGYL) IVPB 500 mg       Placed in "And" Linked Group   500 mg 100 mL/hr over 60 Minutes Intravenous Every 12 hours 05/27/23 2029 05/31/23 0858          Medications  acetaminophen  1,000 mg Oral Q6H   amitriptyline  25 mg Oral QHS   amLODipine  10 mg Oral Daily   aspirin EC  81 mg Oral Daily   atorvastatin  80 mg Oral QPM   brinzolamide  1 drop Both Eyes TID   And   brimonidine  1 drop Both Eyes TID   carvedilol  18.75 mg Oral BID WC   Chlorhexidine Gluconate Cloth  6 each Topical Q0600   famotidine  20 mg Oral Daily   feeding supplement  (GLUCERNA SHAKE)  237 mL Oral TID BM   gabapentin  100 mg Oral TID   heparin  5,000 Units Subcutaneous Q8H   insulin aspart  0-5 Units Subcutaneous QHS   insulin aspart  0-9 Units Subcutaneous TID WC   insulin aspart  4 Units Subcutaneous TID WC   insulin glargine-yfgn  45 Units Subcutaneous Daily   latanoprost  1 drop Both Eyes QHS   magnesium oxide  400 mg Oral BID   mirtazapine  15 mg Oral QHS   mometasone-formoterol  2 puff Inhalation BID   multivitamin  1 tablet Oral QHS   nutrition supplement (JUVEN)  1 packet Oral BID BM   polyethylene glycol  17 g Oral Daily   senna  1 tablet Oral BID   sodium bicarbonate  650 mg Oral BID   sodium chloride flush  10-40 mL Intracatheter Q12H      Subjective:   Jose [name] was seen and examined today.  No complaints, tolerating diet.  No fever, chest pain, shortness of breath, nausea vomiting.  Awaiting SNF.  Objective:   Vitals:   06/10/23 1935 06/11/23 0411 06/11/23 0500 06/11/23 0749  BP: 117/67 127/81  115/71  Pulse: 72 74  78  Resp: 16 16  15   Temp: 98.1 F (36.7 C) 98.2 F (36.8 C)  98.5 F (36.9 C)  TempSrc: Oral Oral    SpO2: 100% 99%  98%  Weight:   70.9 kg   Height:        Intake/Output Summary (Last 24 hours) at 06/11/2023 1440 Last data filed at 06/11/2023 0512 Gross per 24 hour  Intake --  Output 300 ml  Net -300 ml     Wt Readings from Last 3 Encounters:  06/11/23 70.9 kg  05/23/23 81.6 kg  05/09/23 81.6 kg   Physical Exam General: Alert and oriented x 3, NAD Cardiovascular: S1 S2 clear, RRR.  Respiratory: CTAB, no wheezing Gastrointestinal: Soft, nontender, nondistended, NBS Ext: right BKA, stump is healing, staples intact Neuro: no new deficits Psych: Normal affect    Data Reviewed:  I have personally reviewed following labs    CBC Lab Results  Component Value Date   WBC 8.5 06/08/2023   RBC 3.64 (L) 06/08/2023   HGB 9.8 (L) 06/08/2023   HCT 27.5 (L) 06/08/2023   MCV 75.5 (L) 06/08/2023    MCH 26.9 06/08/2023  PLT 335 06/08/2023   MCHC 35.6 06/08/2023   RDW 13.2 06/08/2023   LYMPHSABS 1.6 06/08/2023   MONOABS 0.8 06/08/2023   EOSABS 0.1 06/08/2023   BASOSABS 0.0 06/08/2023     Last metabolic panel Lab Results  Component Value Date   NA 127 (L) 06/10/2023   K 5.0 06/10/2023   CL 96 (L) 06/10/2023   CO2 20 (L) 06/10/2023   BUN 36 (H) 06/10/2023   CREATININE 1.41 (H) 06/10/2023   GLUCOSE 233 (H) 06/10/2023   GFRNONAA 56 (L) 06/10/2023   GFRAA 53 (L) 12/14/2020   CALCIUM 9.5 06/10/2023   PHOS 3.7 04/29/2023   PROT 6.0 (L) 06/10/2023   ALBUMIN 2.6 (L) 06/10/2023   BILITOT 0.3 06/10/2023   ALKPHOS 76 06/10/2023   AST 42 (H) 06/10/2023   ALT 79 (H) 06/10/2023   ANIONGAP 11 06/10/2023    CBG (last 3)  Recent Labs    06/10/23 2029 06/11/23 0751 06/11/23 1137  GLUCAP 186* 137* 123*      Coagulation Profile: No results for input(s): "INR", "PROTIME" in the last 168 hours.   Radiology Studies: I have personally reviewed the imaging studies  No results found.     Thad Ranger M.D. [facility] 06/11/2023, 2:40 PM  Available via Epic secure chat 7am-7pm After 7 pm, please refer to night coverage provider listed on amion.

## 2023-06-11 NOTE — Progress Notes (Signed)
Occupational Therapy Treatment Patient Details Name: Jose [name] MRN: [id number] DOB: [birth year] Today's Date: 06/11/2023   History of present illness Pt is 62 y.o. 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 PMH - CAD, CKD, HTN, DM, COPD   OT comments  Pt demonstrating fair progress towards therapy goals. Pt refused EOB/OOB activity this session secondary to recent EOB activity. Pt agreeable to bed level ADLs with encouragement. Pt continues to present with decreased balance, decreased activity tolerance and right residual limb pain. Pt progressing with dressing tasks performing full body dressing with setup A at bed level. Pt would benefit from continued acute OT services to maximize functional independence and facilitate transition to skilled inpatient follow up therapy, <3 hours/day.    Recommendations for follow up therapy are one component of a multi-disciplinary discharge planning process, led by the attending physician.  Recommendations may be updated based on patient status, additional functional criteria and insurance authorization.    Assistance Recommended at Discharge Frequent or constant Supervision/Assistance  Patient can return home with the following  A little help with walking and/or transfers;A little help with bathing/dressing/bathroom;Assistance with cooking/housework;Assist for transportation;Help with stairs or ramp for entrance   Equipment Recommendations  Other (comment) (defer)    Recommendations for Other Services      Precautions / Restrictions Precautions Precautions: Fall Precaution Comments: R BKA Required Braces or Orthoses: Other Brace Other Brace: limb protector Restrictions Weight Bearing Restrictions: Yes RLE Weight Bearing: Non weight bearing       Mobility Bed Mobility Overal bed mobility: Needs Assistance Bed Mobility: Rolling Rolling: Modified independent  (Device/Increase time)         General bed mobility comments: rolling to L<>R sides in bed using bed rail with mod I    Transfers                   General transfer comment: Pt declined transfers this session     Balance                                           ADL either performed or assessed with clinical judgement   ADL Overall ADL's : Needs assistance/impaired                 Upper Body Dressing : Set up;Bed level Upper Body Dressing Details (indicate cue type and reason): pt doffed/donned hospital gown at bed level with setup A Lower Body Dressing: Set up;Bed level Lower Body Dressing Details (indicate cue type and reason): Pt doffed/donned R limb protector and shorts at bed level with setup A               General ADL Comments: Pt declined EOB/OOB activity this session secondary to recent PT session. Agreeable to bed level ADLs    Extremity/Trunk Assessment Upper Extremity Assessment Upper Extremity Assessment: Overall WFL for tasks assessed   Lower Extremity Assessment Lower Extremity Assessment: Defer to PT evaluation        Vision   Vision Assessment?: No apparent visual deficits   Perception Perception Perception: Not tested   Praxis Praxis Praxis: Not tested    Cognition Arousal/Alertness: Awake/alert Behavior During Therapy: WFL for tasks assessed/performed Overall Cognitive Status: Within Functional Limits for tasks assessed  General Comments: Participatory with encouragement        Exercises      Shoulder Instructions       General Comments VSS on RA    Pertinent Vitals/ Pain       Pain Assessment Pain Assessment: Faces Faces Pain Scale: Hurts a little bit Pain Location: R residual limb Pain Descriptors / Indicators: Discomfort, Grimacing, Guarding, Operative site guarding Pain Intervention(s): Limited activity within patient's tolerance, Monitored  during session  Home Living                                          Prior Functioning/Environment              Frequency  Min 2X/week        Progress Toward Goals  OT Goals(current goals can now be found in the care plan section)  Progress towards OT goals: Progressing toward goals  Acute Rehab OT Goals Patient Stated Goal: to stay in bed OT Goal Formulation: With patient Time For Goal Achievement: 06/15/23 Potential to Achieve Goals: Good ADL Goals Pt Will Perform Grooming: with supervision;standing Pt Will Perform Lower Body Bathing: with modified independence;sitting/lateral leans Pt Will Perform Upper Body Dressing: with modified independence;sitting Pt Will Perform Lower Body Dressing: with modified independence;sitting/lateral leans Pt Will Transfer to Toilet: stand pivot transfer;with modified independence;bedside commode Pt Will Perform Toileting - Clothing Manipulation and hygiene: with modified independence;sitting/lateral leans  Plan Discharge plan remains appropriate;Frequency remains appropriate    Co-evaluation                 AM-PAC OT "6 Clicks" Daily Activity     Outcome Measure   Help from another person eating meals?: None Help from another person taking care of personal grooming?: A Little Help from another person toileting, which includes using toliet, bedpan, or urinal?: A Little Help from another person bathing (including washing, rinsing, drying)?: A Little Help from another person to put on and taking off regular upper body clothing?: A Little Help from another person to put on and taking off regular lower body clothing?: A Little 6 Click Score: 19    End of Session Equipment Utilized During Treatment: Other (comment) (none)  OT Visit Diagnosis: Unsteadiness on feet (R26.81);Pain;Muscle weakness (generalized) (M62.81)   Activity Tolerance Patient tolerated treatment well   Patient Left in bed;with call  bell/phone within reach   Nurse Communication Mobility status        Time: 9518-8416 OT Time Calculation (min): 12 min  Charges: OT General Charges $OT Visit: 1 Visit OT Treatments $Self Care/Home Management : 8-22 mins  Sherley Bounds, OTS Acute Rehabilitation Services Office [phone] Secure Chat Communication Preferred   Sherley Bounds 06/11/2023, 2:10 PM

## 2023-06-12 DIAGNOSIS — Z89511 Acquired absence of right leg below knee: Secondary | ICD-10-CM | POA: Diagnosis not present

## 2023-06-12 DIAGNOSIS — L03818 Cellulitis of other sites: Secondary | ICD-10-CM | POA: Diagnosis not present

## 2023-06-12 DIAGNOSIS — I2510 Atherosclerotic heart disease of native coronary artery without angina pectoris: Secondary | ICD-10-CM | POA: Diagnosis not present

## 2023-06-12 DIAGNOSIS — L0390 Cellulitis, unspecified: Secondary | ICD-10-CM

## 2023-06-12 DIAGNOSIS — T8189XA Other complications of procedures, not elsewhere classified, initial encounter: Secondary | ICD-10-CM | POA: Diagnosis not present

## 2023-06-12 LAB — GLUCOSE, CAPILLARY: Glucose-Capillary: 242 mg/dL — ABNORMAL HIGH (ref 70–99)

## 2023-06-12 NOTE — TOC Transition Note (Signed)
Transition of Care [facility]) - CM/SW Discharge Note   Patient Details  Name: Jonovan Boedecker MRN: [id number] Date of Birth: 1961/03/08  Transition of Care [facility]) CM/SW Contact:  Halli Equihua A [place], Theresia Majors Phone Number: 06/12/2023, 11:22 AM   Clinical Narrative:     Patient will DC to: [facility]  Anticipated DC date: 06/12/23  Family notified: Winferd Humphrey  Transport by: [facility]      Per MD patient ready for DC to [facility] . RN, patient, patient's family, and facility notified of DC. Discharge Summary and FL2 sent to facility. RN to call report prior to discharge [phone] ext:[phone] ). DC packet on chart. Ambulance transport requested for patient.     CSW will sign off for now as social work intervention is no longer needed. Please consult [place] again if new needs arise.    Final next level of care: Skilled Nursing Facility Barriers to Discharge: Barriers Resolved   Patient Goals and CMS Choice CMS Medicare.gov Compare Post Acute Care list provided to:: Patient Choice offered to / list presented to : Patient  Discharge Placement                Patient chooses bed at:  [facility]) Patient to be transferred to facility by: [facility] Name of family member notified: Algenis Ballin Patient and family notified of of transfer: 06/12/23  Discharge Plan and Services Additional resources added to the After Visit Summary for     Discharge Planning Services: CM Consult Post Acute Care Choice: IP Rehab                               Social Determinants of Health (SDOH) Interventions SDOH Screenings   Food Insecurity: No Food Insecurity (05/28/2023)  Housing: Low Risk  (05/28/2023)  Transportation Needs: No Transportation Needs (05/28/2023)  Utilities: Not At Risk (05/28/2023)  Depression (PHQ2-9): High Risk (08/11/2022)   Social Connections: Unknown (03/05/2023)   Received from [facility]  Tobacco Use: Medium Risk (05/30/2023)     Readmission Risk Interventions    05/29/2023    2:21 PM 04/29/2023    1:09 PM 01/29/2022    2:46 PM  Readmission Risk Prevention Plan  Transportation Screening Complete Complete Complete  PCP or Specialist Appt within 3-5 Days   Complete  HRI or Home Care Consult   Complete  Social Work Consult for Recovery Care Planning/Counseling   Complete  Palliative Care Screening   Not Applicable  Medication Review Oceanographer) Complete Complete Complete  PCP or Specialist appointment within 3-5 days of discharge Complete    HRI or Home Care Consult Complete Complete   SW Recovery Care/Counseling Consult Complete Complete   Palliative Care Screening Not Applicable Not Applicable   Skilled Nursing Facility Complete Not Applicable

## 2023-06-12 NOTE — Plan of Care (Signed)
  Problem: Activity: Goal: Ability to return to baseline activity level will improve Outcome: Progressing   Problem: Skin Integrity: Goal: Risk for impaired skin integrity will decrease Outcome: Progressing   Problem: Tissue Perfusion: Goal: Adequacy of tissue perfusion will improve Outcome: Progressing   Problem: Pain Managment: Goal: General experience of comfort will improve Outcome: Progressing   Problem: Safety: Goal: Ability to remain free from injury will improve Outcome: Progressing

## 2023-06-24 ENCOUNTER — Telehealth: Payer: Self-pay

## 2023-06-24 NOTE — Telephone Encounter (Signed)
Caller: [facility] Maple Hudson [facility]  Concern: serosanguinous, non-odorous drainage at stump, redness, mild discomfort on palpation, mild bogginess  Location: right leg at lateral aspect  Description:  noticed yesterday, 7/30  Aggravating Factors: touching  Treatments:  elevation, provider at facility assessed, but has concerns for deterioration  Procedure: Amputation R BKA  Resolution: Appointment scheduled for PA f/u on Dr. Edilia Bo clinic day  Next Appt: Appointment scheduled for 06/25/23 @ 1400

## 2023-06-25 ENCOUNTER — Inpatient Hospital Stay (HOSPITAL_COMMUNITY)
Admission: EM | Admit: 2023-06-25 | Discharge: 2023-07-09 | DRG: 475 | Disposition: A | Discharge status: Skilled Nursing Facility | Payer: No Typology Code available for payment source | Source: Skilled Nursing Facility | Attending: Internal Medicine | Admitting: Internal Medicine

## 2023-06-25 ENCOUNTER — Other Ambulatory Visit: Payer: Self-pay

## 2023-06-25 ENCOUNTER — Emergency Department (HOSPITAL_COMMUNITY): Payer: No Typology Code available for payment source

## 2023-06-25 ENCOUNTER — Ambulatory Visit (INDEPENDENT_AMBULATORY_CARE_PROVIDER_SITE_OTHER): Payer: No Typology Code available for payment source

## 2023-06-25 VITALS — BP 131/83 | HR 78 | Temp 98.0°F | Resp 16 | Ht 71.0 in | Wt 148.0 lb

## 2023-06-25 DIAGNOSIS — E876 Hypokalemia: Secondary | ICD-10-CM | POA: Diagnosis present

## 2023-06-25 DIAGNOSIS — E785 Hyperlipidemia, unspecified: Secondary | ICD-10-CM | POA: Diagnosis present

## 2023-06-25 DIAGNOSIS — J42 Unspecified chronic bronchitis: Secondary | ICD-10-CM | POA: Diagnosis not present

## 2023-06-25 DIAGNOSIS — T8743 Infection of amputation stump, right lower extremity: Principal | ICD-10-CM | POA: Diagnosis present

## 2023-06-25 DIAGNOSIS — E1159 Type 2 diabetes mellitus with other circulatory complications: Secondary | ICD-10-CM | POA: Diagnosis not present

## 2023-06-25 DIAGNOSIS — I252 Old myocardial infarction: Secondary | ICD-10-CM

## 2023-06-25 DIAGNOSIS — N401 Enlarged prostate with lower urinary tract symptoms: Secondary | ICD-10-CM

## 2023-06-25 DIAGNOSIS — T402X5A Adverse effect of other opioids, initial encounter: Secondary | ICD-10-CM | POA: Diagnosis present

## 2023-06-25 DIAGNOSIS — Z89611 Acquired absence of right leg above knee: Secondary | ICD-10-CM | POA: Diagnosis not present

## 2023-06-25 DIAGNOSIS — Z89511 Acquired absence of right leg below knee: Secondary | ICD-10-CM

## 2023-06-25 DIAGNOSIS — N400 Enlarged prostate without lower urinary tract symptoms: Secondary | ICD-10-CM | POA: Diagnosis present

## 2023-06-25 DIAGNOSIS — I10 Essential (primary) hypertension: Secondary | ICD-10-CM | POA: Diagnosis not present

## 2023-06-25 DIAGNOSIS — Z955 Presence of coronary angioplasty implant and graft: Secondary | ICD-10-CM | POA: Diagnosis not present

## 2023-06-25 DIAGNOSIS — I2510 Atherosclerotic heart disease of native coronary artery without angina pectoris: Secondary | ICD-10-CM | POA: Diagnosis not present

## 2023-06-25 DIAGNOSIS — Z794 Long term (current) use of insulin: Secondary | ICD-10-CM | POA: Diagnosis not present

## 2023-06-25 DIAGNOSIS — I152 Hypertension secondary to endocrine disorders: Secondary | ICD-10-CM | POA: Diagnosis present

## 2023-06-25 DIAGNOSIS — Y835 Amputation of limb(s) as the cause of abnormal reaction of the patient, or of later complication, without mention of misadventure at the time of the procedure: Secondary | ICD-10-CM | POA: Diagnosis present

## 2023-06-25 DIAGNOSIS — Z7982 Long term (current) use of aspirin: Secondary | ICD-10-CM | POA: Diagnosis not present

## 2023-06-25 DIAGNOSIS — T8781 Dehiscence of amputation stump: Secondary | ICD-10-CM | POA: Diagnosis not present

## 2023-06-25 DIAGNOSIS — D509 Iron deficiency anemia, unspecified: Secondary | ICD-10-CM | POA: Diagnosis present

## 2023-06-25 DIAGNOSIS — Z681 Body mass index (BMI) 19 or less, adult: Secondary | ICD-10-CM | POA: Diagnosis not present

## 2023-06-25 DIAGNOSIS — Z87891 Personal history of nicotine dependence: Secondary | ICD-10-CM | POA: Diagnosis not present

## 2023-06-25 DIAGNOSIS — E1122 Type 2 diabetes mellitus with diabetic chronic kidney disease: Secondary | ICD-10-CM | POA: Diagnosis present

## 2023-06-25 DIAGNOSIS — L089 Local infection of the skin and subcutaneous tissue, unspecified: Secondary | ICD-10-CM | POA: Diagnosis not present

## 2023-06-25 DIAGNOSIS — E871 Hypo-osmolality and hyponatremia: Secondary | ICD-10-CM | POA: Diagnosis not present

## 2023-06-25 DIAGNOSIS — E1151 Type 2 diabetes mellitus with diabetic peripheral angiopathy without gangrene: Secondary | ICD-10-CM | POA: Diagnosis present

## 2023-06-25 DIAGNOSIS — K5903 Drug induced constipation: Secondary | ICD-10-CM | POA: Diagnosis present

## 2023-06-25 DIAGNOSIS — E118 Type 2 diabetes mellitus with unspecified complications: Secondary | ICD-10-CM

## 2023-06-25 DIAGNOSIS — E11649 Type 2 diabetes mellitus with hypoglycemia without coma: Secondary | ICD-10-CM | POA: Diagnosis not present

## 2023-06-25 DIAGNOSIS — F32A Depression, unspecified: Secondary | ICD-10-CM | POA: Diagnosis not present

## 2023-06-25 DIAGNOSIS — J449 Chronic obstructive pulmonary disease, unspecified: Secondary | ICD-10-CM | POA: Diagnosis present

## 2023-06-25 DIAGNOSIS — Z7902 Long term (current) use of antithrombotics/antiplatelets: Secondary | ICD-10-CM

## 2023-06-25 DIAGNOSIS — Z8249 Family history of ischemic heart disease and other diseases of the circulatory system: Secondary | ICD-10-CM | POA: Diagnosis not present

## 2023-06-25 DIAGNOSIS — I129 Hypertensive chronic kidney disease with stage 1 through stage 4 chronic kidney disease, or unspecified chronic kidney disease: Secondary | ICD-10-CM | POA: Diagnosis present

## 2023-06-25 DIAGNOSIS — T8189XA Other complications of procedures, not elsewhere classified, initial encounter: Secondary | ICD-10-CM

## 2023-06-25 DIAGNOSIS — T8789 Other complications of amputation stump: Secondary | ICD-10-CM | POA: Diagnosis not present

## 2023-06-25 DIAGNOSIS — N1832 Chronic kidney disease, stage 3b: Secondary | ICD-10-CM | POA: Diagnosis present

## 2023-06-25 DIAGNOSIS — T148XXA Other injury of unspecified body region, initial encounter: Secondary | ICD-10-CM | POA: Diagnosis not present

## 2023-06-25 DIAGNOSIS — E1169 Type 2 diabetes mellitus with other specified complication: Secondary | ICD-10-CM | POA: Diagnosis present

## 2023-06-25 DIAGNOSIS — Z7951 Long term (current) use of inhaled steroids: Secondary | ICD-10-CM

## 2023-06-25 DIAGNOSIS — Z751 Person awaiting admission to adequate facility elsewhere: Secondary | ICD-10-CM

## 2023-06-25 DIAGNOSIS — Z79899 Other long term (current) drug therapy: Secondary | ICD-10-CM

## 2023-06-25 LAB — COMPREHENSIVE METABOLIC PANEL
ALT: 25 U/L (ref 0–44)
AST: 24 U/L (ref 15–41)
Albumin: 2.9 g/dL — ABNORMAL LOW (ref 3.5–5.0)
Alkaline Phosphatase: 76 U/L (ref 38–126)
Anion gap: 16 — ABNORMAL HIGH (ref 5–15)
BUN: 29 mg/dL — ABNORMAL HIGH (ref 8–23)
CO2: 17 mmol/L — ABNORMAL LOW (ref 22–32)
Calcium: 8.8 mg/dL — ABNORMAL LOW (ref 8.9–10.3)
Chloride: 95 mmol/L — ABNORMAL LOW (ref 98–111)
Creatinine, Ser: 1.6 mg/dL — ABNORMAL HIGH (ref 0.61–1.24)
GFR, Estimated: 48 mL/min — ABNORMAL LOW (ref >60)
Glucose, Bld: 157 mg/dL — ABNORMAL HIGH (ref 70–99)
Potassium: 4.2 mmol/L (ref 3.5–5.1)
Sodium: 128 mmol/L — ABNORMAL LOW (ref 135–145)
Total Bilirubin: 0.7 mg/dL (ref 0.3–1.2)
Total Protein: 6.5 g/dL (ref 6.5–8.1)

## 2023-06-25 LAB — URINALYSIS, W/ REFLEX TO CULTURE (INFECTION SUSPECTED)
Bilirubin Urine: NEGATIVE
Glucose, UA: NEGATIVE mg/dL
Hgb urine dipstick: NEGATIVE
Ketones, ur: NEGATIVE mg/dL
Leukocytes,Ua: NEGATIVE
Nitrite: NEGATIVE
Protein, ur: 100 mg/dL — AB
Specific Gravity, Urine: 1.02 (ref 1.005–1.030)
pH: 5 (ref 5.0–8.0)

## 2023-06-25 LAB — CBC WITH DIFFERENTIAL/PLATELET
Abs Immature Granulocytes: 0.03 10*3/uL (ref 0.00–0.07)
Basophils Absolute: 0 10*3/uL (ref 0.0–0.1)
Basophils Relative: 0 %
Eosinophils Absolute: 0.2 10*3/uL (ref 0.0–0.5)
Eosinophils Relative: 2 %
HCT: 22.3 % — ABNORMAL LOW (ref 39.0–52.0)
Hemoglobin: 7.8 g/dL — ABNORMAL LOW (ref 13.0–17.0)
Immature Granulocytes: 0 %
Lymphocytes Relative: 21 %
Lymphs Abs: 1.9 10*3/uL (ref 0.7–4.0)
MCH: 27.1 pg (ref 26.0–34.0)
MCHC: 35 g/dL (ref 30.0–36.0)
MCV: 77.4 fL — ABNORMAL LOW (ref 80.0–100.0)
Monocytes Absolute: 0.9 10*3/uL (ref 0.1–1.0)
Monocytes Relative: 9 %
Neutro Abs: 6.3 10*3/uL (ref 1.7–7.7)
Neutrophils Relative %: 68 %
Platelets: 196 10*3/uL (ref 150–400)
RBC: 2.88 MIL/uL — ABNORMAL LOW (ref 4.22–5.81)
RDW: 14.1 % (ref 11.5–15.5)
WBC: 9.3 10*3/uL (ref 4.0–10.5)
nRBC: 0 % (ref 0.0–0.2)

## 2023-06-25 LAB — PROTIME-INR
INR: 1.1 (ref 0.8–1.2)
Prothrombin Time: 14.6 seconds (ref 11.4–15.2)

## 2023-06-25 LAB — APTT: aPTT: 38 seconds — ABNORMAL HIGH (ref 24–36)

## 2023-06-25 LAB — I-STAT CG4 LACTIC ACID, ED
Lactic Acid, Venous: 2 mmol/L (ref 0.5–1.9)
Lactic Acid, Venous: 1.1 mmol/L (ref 0.5–1.9)

## 2023-06-25 MED ORDER — PIPERACILLIN-TAZOBACTAM 3.375 G IVPB
3.3750 g | Freq: Three times a day (TID) | INTRAVENOUS | Status: DC
  Administered 2023-06-26 – 2023-07-02 (×19): 3.375 g via INTRAVENOUS
  Filled 2023-06-25 (×18): qty 50

## 2023-06-25 MED ORDER — HYDROMORPHONE HCL 1 MG/ML IJ SOLN
0.5000 mg | Freq: Once | INTRAMUSCULAR | Status: DC
  Administered 2023-06-25: 0.5 mg via INTRAVENOUS
  Filled 2023-06-25: qty 1

## 2023-06-25 MED ORDER — SODIUM CHLORIDE 0.9 % IV SOLN
2.0000 g | Freq: Once | INTRAVENOUS | Status: AC
  Administered 2023-06-25: 2 g via INTRAVENOUS
  Filled 2023-06-25: qty 12.5

## 2023-06-25 MED ORDER — HYDROMORPHONE HCL 1 MG/ML IJ SOLN
INTRAMUSCULAR | Status: AC
  Administered 2023-06-25: 1 mg
  Filled 2023-06-25: qty 1

## 2023-06-25 MED ORDER — HYDROMORPHONE HCL 1 MG/ML IJ SOLN
0.5000 mg | Freq: Once | INTRAMUSCULAR | Status: AC
  Administered 2023-06-25: 0.5 mg via INTRAVENOUS
  Filled 2023-06-25: qty 1

## 2023-06-25 MED ORDER — VANCOMYCIN HCL IN DEXTROSE 1-5 GM/200ML-% IV SOLN: 1000.0000 mg | Freq: Once | INTRAVENOUS | Status: DC

## 2023-06-25 MED ORDER — HYDROMORPHONE HCL 1 MG/ML IJ SOLN
0.5000 mg | Freq: Once | INTRAMUSCULAR | Status: DC
  Filled 2023-06-25: qty 1

## 2023-06-25 MED ORDER — LACTATED RINGERS IV BOLUS (SEPSIS)
1000.0000 mL | Freq: Once | INTRAVENOUS | Status: AC
  Administered 2023-06-25: 1000 mL via INTRAVENOUS

## 2023-06-25 MED ORDER — METRONIDAZOLE 500 MG/100ML IV SOLN
500.0000 mg | Freq: Once | INTRAVENOUS | Status: AC
  Administered 2023-06-25: 500 mg via INTRAVENOUS
  Filled 2023-06-25: qty 100

## 2023-06-25 MED ORDER — VANCOMYCIN HCL 1250 MG/250ML IV SOLN
1250.0000 mg | Freq: Once | INTRAVENOUS | Status: AC
  Administered 2023-06-25: 1250 mg via INTRAVENOUS
  Filled 2023-06-25: qty 250

## 2023-06-25 MED ORDER — VANCOMYCIN HCL IN DEXTROSE 1-5 GM/200ML-% IV SOLN
1000.0000 mg | INTRAVENOUS | Status: DC
  Administered 2023-06-26 – 2023-07-01 (×6): 1000 mg via INTRAVENOUS
  Filled 2023-06-25 (×7): qty 200

## 2023-06-25 MED ORDER — METHOCARBAMOL 500 MG PO TABS
500.0000 mg | ORAL_TABLET | Freq: Once | ORAL | Status: AC
  Administered 2023-06-25: 500 mg via ORAL
  Filled 2023-06-25: qty 1

## 2023-06-25 MED ORDER — LACTATED RINGERS IV BOLUS
1000.0000 mL | Freq: Once | INTRAVENOUS | Status: AC
  Administered 2023-06-25: 1000 mL via INTRAVENOUS

## 2023-06-25 NOTE — Assessment & Plan Note (Signed)
Chronic. 

## 2023-06-25 NOTE — Assessment & Plan Note (Signed)
Continue with ASA.

## 2023-06-25 NOTE — Assessment & Plan Note (Addendum)
Continue with coreg 18.75 bid and norvasc 10 mg

## 2023-06-25 NOTE — Assessment & Plan Note (Signed)
Stable. Continue with inhalers.

## 2023-06-25 NOTE — Progress Notes (Signed)
POST OPERATIVE OFFICE NOTE    CC:  F/u for surgery  HPI:  Jose [name] is a 62 y.o. male who is s/p right below-knee amputation by Dr. Randie Heinz on 05/30/2023.  This was done for a poorly healing right TMA performed on 05/12/2023.  He also has a prior history of shockwave lithotripsy and drug-coated balloon angioplasty of the right popliteal artery on 04/27/2023 by Dr. Randie Heinz.  He returns today as a triage visit.  After his below-knee amputation, [facility] [place]  His provider was concerned about the viability of his right below-knee amputation site and called our office yesterday.  Today in clinic the patient states his right below-knee amputation hurts more than it ever has.  He has dealt with phantom pain in the stump since his surgery, but this has greatly worsened over the past week.  His stump is very tender to touch.  He endorses some drainage from the incision line, but is unsure the color of it.  He endorses feeling feverish over the past 3 days.  I have spoken with his provider from the [place] on the phone today.  He states that the patient's incision started breaking down over the past 3 to 4 days and has been developing worsening erythema, tenderness, and gangrenous changes.  This morning the patient was running a fever of 100.21F and his provider gave him a shot of Rocephin prior to his appointment our office.   Allergies  Allergen Reactions   Lisinopril-Hydrochlorothiazide Swelling and Other (See Comments)    Causes swelling of lips   Simvastatin Other (See Comments)    ALT (SGPT) level raised, Aspartate aminotransferase serum level raised    Current Outpatient Medications  Medication Sig Dispense Refill   acetaminophen (TYLENOL) 325 MG tablet Take 650 mg by mouth every 4 (four) hours as needed for mild pain, moderate pain or headache.     albuterol (PROVENTIL HFA;VENTOLIN HFA) 108 (90 BASE) MCG/ACT inhaler Inhale 2 puffs into the lungs every 6 (six) hours  as needed for wheezing or shortness of breath.      amitriptyline (ELAVIL) 25 MG tablet Take 1 tablet (25 mg total) by mouth at bedtime. 30 tablet 1   amLODipine (NORVASC) 10 MG tablet Take 1 tablet (10 mg total) by mouth daily. 90 tablet 3   aspirin EC 81 MG tablet Take 81 mg by mouth daily. Swallow whole.     atorvastatin (LIPITOR) 80 MG tablet Take 1 tablet (80 mg total) by mouth daily. (Patient taking differently: Take 80 mg by mouth every evening.) 30 tablet 2   bisacodyl (DULCOLAX) 5 MG EC tablet Take 1 tablet (5 mg total) by mouth daily as needed for moderate constipation.     Brinzolamide-Brimonidine 1-0.2 % SUSP Place 1 drop into both eyes 3 (three) times daily. (Patient not taking: Reported on 05/27/2023)     carvedilol (COREG) 12.5 MG tablet Take 1.5 tablets (18.75 mg total) by mouth 2 (two) times daily with a meal.     clopidogrel (PLAVIX) 75 MG tablet Take 1 tablet (75 mg total) by mouth daily with breakfast. 30 tablet 5   cyanocobalamin (VITAMIN B12) 500 MCG tablet Take 500 mcg by mouth daily.     Dextrose-Fructose-Sod Citrate 4.35-4.17-0.921 GM/15ML LIQD Take 15 cm by mouth as needed (15cm (1 tube) by mouth as needed for hypoglycemia for  glucose less than 70, repeat every 15 minutes if blood sugar less than 70.).     famotidine (  PEPCID) 20 MG tablet Take 1 tablet (20 mg total) by mouth daily.     fluticasone-salmeterol (ADVAIR) 500-50 MCG/ACT AEPB Inhale 1 puff into the lungs in the morning and at bedtime.     folic acid (FOLVITE) 1 MG tablet Take 1 tablet (1 mg total) by mouth daily.     gabapentin (NEURONTIN) 100 MG capsule Take 1 capsule (100 mg total) by mouth 3 (three) times daily.     hydrOXYzine (ATARAX) 25 MG tablet Take 1 tablet (25 mg total) by mouth 3 (three) times daily as needed for itching or anxiety. 20 tablet 0   insulin aspart (NOVOLOG) 100 UNIT/ML injection Inject 4 Units into the skin 3 (three) times daily with meals.     insulin aspart (NOVOLOG) 100 UNIT/ML  injection Inject 0-9 Units into the skin 3 (three) times daily with meals. Sliding scale CBG 70 - 120: 0 units CBG 121 - 150: 1 unit,  CBG 151 - 200: 2 units,  CBG 201 - 250: 3 units,  CBG 251 - 300: 5 units,  CBG 301 - 350: 7 units,  CBG 351 - 400: 9 units   CBG > 400: 9 units and notify your MD     insulin glargine (LANTUS) 100 UNIT/ML injection Inject 0.45 mLs (45 Units total) into the skin daily.     Insulin Syringe-Needle U-100 30G X 1/2" 0.5 ML MISC Use with Lantus 30 each 0   ipratropium-albuterol (DUONEB) 0.5-2.5 (3) MG/3ML SOLN Take 3 mLs by nebulization every 6 (six) hours as needed (for shortness of breath and wheezing).     latanoprost (XALATAN) 0.005 % ophthalmic solution Place 1 drop into both eyes at bedtime.     Magnesium Oxide 420 MG TABS Take 420 mg by mouth daily.     mirtazapine (REMERON) 15 MG tablet Take 1 tablet (15 mg total) by mouth at bedtime. 20 tablet 0   Multiple Vitamin (MULTIVITAMIN WITH MINERALS) TABS tablet Take 1 tablet by mouth daily.     naloxone (NARCAN) nasal spray 4 mg/0.1 mL Place 1 spray into the nose once. Instill 1 spray into one nostril as directed for opioid overdose, call 911 immediately, administer dose, then turn person on side if no response in 2-3 minutes or person responds but relapses. Repeat using a new spray device and into the other nostril. (Patient not taking: Reported on 05/27/2023)     nitroGLYCERIN (NITROSTAT) 0.4 MG SL tablet Place 1 tablet (0.4 mg total) under the tongue every 5 (five) minutes as needed for chest pain. 25 tablet 2   Omega-3 Fatty Acids (FISH OIL) 1000 MG CAPS Take 1,000 mg by mouth 2 (two) times daily.     oxyCODONE-acetaminophen (PERCOCET/ROXICET) 5-325 MG tablet Take 1 tablet by mouth every 4 (four) hours as needed for severe pain. 20 tablet 0   polyethylene glycol (MIRALAX / GLYCOLAX) 17 g packet Take 17 g by mouth 2 (two) times daily. (Patient taking differently: Take 17 g by mouth daily as needed for mild constipation.) 14  each 0   senna-docusate (SENOKOT-S) 8.6-50 MG tablet Take 1 tablet by mouth 2 (two) times daily for 14 days.     simethicone (MYLICON) 40 MG/0.6ML drops Take 0.6 mLs (40 mg total) by mouth every 6 (six) hours as needed for flatulence.     sodium bicarbonate 650 MG tablet Take 1 tablet (650 mg total) by mouth 2 (two) times daily.     tadalafil (CIALIS) 10 MG tablet Take 1 tablet (10 mg  total) by mouth daily as needed for erectile dysfunction. 10 tablet 0   thiamine (VITAMIN B-1) 100 MG tablet Take 1 tablet (100 mg total) by mouth daily.     No current facility-administered medications for this visit.     ROS:  See HPI  Physical Exam:  General: alert, anxious Incision: Right below-knee amputation site with erythema surrounding the incision and bottom of the stump.  Gangrenous skin changes to the incision line both medially and laterally.  Dehiscence of the staple line laterally with an underlying wound and bogginess.  There is serosanguineous drainage coming from the lateral portion of the stump.  The stump is very warm and exquisitely tender to touch.     Assessment/Plan:  This is a 62 y.o. male who is s/p: Right below-knee amputation on 05/30/2023  -The patient recently underwent right below-knee amputation due to an infected and poorly healing TMA.  He was sent to our office today as a triage visit.  He has been at a [place] rehab facility for the past 2 weeks. -The patient has had worsening stump pain for the past week, along with erythema surrounding his incision line and exquisite tenderness to the stump.  He also had a fever this morning and was given a shot of Rocephin. -On exam the right BKA stump has erythema surrounding the incision line and bottom of the stump.  It is warm to touch.  There are gangrenous changes to the incision line with an area of bogginess and underlying wound to the right lateral portion of the stump.  The underlying wound has serosanguineous drainage. -Given that the  patient ran a fever this morning and has increasing erythema and pain, I believe that his amputation site is infected.  The patient is currently resistant to surgical revision.  I have recommended he at least be admitted to the hospital under the medicine service for IV antibiotics and cultures.  During his admission we will evaluate his stump for viability and the possible need for surgical revision. -He is being transported via ambulance to [facility] emergency room  Loel Dubonnet, [facility] [phone]   Clinic MD:  Edilia Bo

## 2023-06-25 NOTE — Assessment & Plan Note (Signed)
Admit to med/surg. Zosyn and vanco. EDP to contact vascular surgery. Pt is not opposed to having stump debrided but wants to talk to surgeon if he needs further amputation.

## 2023-06-25 NOTE — Assessment & Plan Note (Signed)
Stable

## 2023-06-25 NOTE — Subjective & Objective (Signed)
CC: right BKA stump infection HPI: 62 year old African-American male history of peripheral disease status post right BKA on July 6 thousand 24, history of type 2 diabetes, hypertension, CKD stage IIIb, coronary disease, hyperlipidemia presents to the ER from the vascular surgery office.  Patient states that he has noticed increasing pain and drainage from the right BKA stump.  Patient seen and vascular surgery clinic.  He had evidence of infection at the stump.  Patient sent to the hospital for IV antibiotics and admission.  Patient denies any fever or chills.   [facility] hospitalist contacted for admission.

## 2023-06-25 NOTE — H&P (Signed)
History and Physical    Jose [name] WUJ:[id number] DOB: [birth year] DOA: 06/25/2023  DOS: the patient was seen and examined on 06/25/2023  PCP: [name]   [facility] [place], [place]  I have personally briefly reviewed patient's old medical records in [facility]  CC: right BKA stump infection HPI: 62 year old African-American male history of peripheral disease status post right BKA on July 6 thousand 24, history of type 2 diabetes, hypertension, CKD stage IIIb, coronary disease, hyperlipidemia presents to the ER from the vascular surgery office.  Patient states that he has noticed increasing pain and drainage from the right BKA stump.  Patient seen and vascular surgery clinic.  He had evidence of infection at the stump.  Patient sent to the hospital for IV antibiotics and admission.  Patient denies any fever or chills.   [facility] contacted for admission.   ED Course: WBC 9.3, lactic acid of 2.0  Review of Systems:  Review of Systems  Constitutional:  Negative for fever.  HENT: Negative.    Eyes: Negative.   Respiratory: Negative.    Cardiovascular: Negative.   Gastrointestinal: Negative.   Genitourinary: Negative.   Musculoskeletal:        Increased pain and drainage from right BKA stump  Skin: Negative.   Neurological: Negative.   Endo/Heme/Allergies: Negative.   Psychiatric/Behavioral: Negative.    All other systems reviewed and are negative.   Past Medical History:  Diagnosis Date   Alcohol abuse    CAD (coronary artery disease)    a. Reported MI in 2012 s/p 2 stents;  b. 08/2012 Cath: LM 20, LAD 20 diff ISR, jailed septal - 99%, LCX 30ost, RI large, min irregs, RCA 30p, 20-30 ISR-->Med Rx; c. 2017 [facility]   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) 04/24/2023   Gangrene of right foot (HCC) 04/24/2023   Hyperlipidemia    Hypertension    Hypertensive urgency 02/11/2021   Metabolic acidosis 05/11/2023   Noncompliance     Past Surgical History:  Procedure Laterality Date   ABDOMINAL AORTOGRAM W/LOWER EXTREMITY N/A 04/27/2023   Procedure: ABDOMINAL AORTOGRAM W/LOWER EXTREMITY;  Surgeon: Maeola Harman, MD;  Location: [facility] INVASIVE CV LAB;  Service: Cardiovascular;  Laterality: N/A;   AMPUTATION Right 05/30/2023   Procedure: AMPUTATION BELOW KNEE;  Surgeon: Maeola Harman, MD;  Location: [facility] OR;  Service: Vascular;  Laterality: Right;   CARDIAC CATHETERIZATION     CORONARY STENT PLACEMENT     FRACTURE SURGERY Right    hardware from right leg fracture   LEFT HEART CATH AND CORONARY ANGIOGRAPHY N/A 08/22/2020   Procedure: LEFT HEART CATH AND CORONARY ANGIOGRAPHY;  Surgeon: Lyn Records, MD;  Location: [facility] INVASIVE CV LAB;  Service: Cardiovascular;  Laterality: N/A;   LEFT HEART CATH AND CORONARY ANGIOGRAPHY N/A 01/28/2022   Procedure: LEFT HEART CATH AND CORONARY ANGIOGRAPHY;  Surgeon: Lennette Bihari, MD;  Location: [facility] INVASIVE CV LAB;  Service: Cardiovascular;  Laterality: N/A;   LEFT HEART CATHETERIZATION WITH CORONARY ANGIOGRAM Bilateral 08/25/2012   Procedure: LEFT HEART CATHETERIZATION WITH CORONARY ANGIOGRAM;  Surgeon: Kathleene Hazel, MD;  Location: [facility] CATH LAB;  Service: Cardiovascular;  Laterality: Bilateral;   PERIPHERAL INTRAVASCULAR LITHOTRIPSY  04/27/2023  Procedure: PERIPHERAL INTRAVASCULAR LITHOTRIPSY;  Surgeon: Maeola Harman, MD;  Location: [facility] INVASIVE CV LAB;  Service: Cardiovascular;;   PERIPHERAL VASCULAR BALLOON ANGIOPLASTY  04/27/2023   Procedure: PERIPHERAL VASCULAR BALLOON ANGIOPLASTY;  Surgeon: Maeola Harman, MD;  Location: [facility] INVASIVE CV LAB;  Service: Cardiovascular;;   TRANSMETATARSAL AMPUTATION Right 05/12/2023    Procedure: TRANSMETATARSAL AMPUTATION;  Surgeon: Chuck Hint, MD;  Location: [facility] OR;  Service: Vascular;  Laterality: Right;     reports that he quit smoking about 15 years ago. His smoking use included cigarettes. He has never used smokeless tobacco. He reports current alcohol use. He reports current drug use. Drug: Marijuana.  Allergies  Allergen Reactions   Lisinopril-Hydrochlorothiazide Swelling and Other (See Comments)    Causes swelling of lips   Simvastatin Other (See Comments)    ALT (SGPT) level raised, Aspartate aminotransferase serum level raised    Family History  Problem Relation Age of Onset   Heart disease Mother        MI 56s    Prior to Admission medications   Medication Sig Start Date End Date Taking? Authorizing Provider  albuterol (PROVENTIL HFA;VENTOLIN HFA) 108 (90 BASE) MCG/ACT inhaler Inhale 2 puffs into the lungs every 6 (six) hours as needed for wheezing or shortness of breath.    Yes [provider]  amitriptyline (ELAVIL) 25 MG tablet Take 1 tablet (25 mg total) by mouth at bedtime. 05/15/23  Yes Rodolph Bong, MD  amLODipine (NORVASC) 10 MG tablet Take 1 tablet (10 mg total) by mouth daily. 03/12/21  Yes [name], Deloris Ping, MD  aspirin EC 81 MG tablet Take 81 mg by mouth daily. Swallow whole.   Yes [provider]  atorvastatin (LIPITOR) 80 MG tablet Take 1 tablet (80 mg total) by mouth daily. Patient taking differently: Take 80 mg by mouth every evening. 01/30/22  Yes Marjie Skiff E, PA-C  bisacodyl (DULCOLAX) 5 MG EC tablet Take 1 tablet (5 mg total) by mouth daily as needed for moderate constipation. 06/11/23  Yes [name], Ripudeep K, MD  Brinzolamide-Brimonidine 1-0.2 % SUSP Place 1 drop into both eyes 3 (three) times daily.   Yes [provider]  carvedilol (COREG) 12.5 MG tablet Take 1.5 tablets (18.75 mg total) by mouth 2 (two) times daily with a meal. 06/11/23  Yes [name], Ripudeep K, MD  clopidogrel (PLAVIX) 75 MG  tablet Take 1 tablet (75 mg total) by mouth daily with breakfast. 01/30/22  Yes Marjie Skiff E, PA-C  cyanocobalamin (VITAMIN B12) 500 MCG tablet Take 500 mcg by mouth daily.   Yes [provider]  famotidine (PEPCID) 20 MG tablet Take 1 tablet (20 mg total) by mouth daily. 06/12/23  Yes [name], Ripudeep K, MD  folic acid (FOLVITE) 1 MG tablet Take 1 tablet (1 mg total) by mouth daily. 05/16/23  Yes Rodolph Bong, MD  gabapentin (NEURONTIN) 100 MG capsule Take 1 capsule (100 mg total) by mouth 3 (three) times daily. 06/11/23  Yes [name], Ripudeep K, MD  insulin aspart (NOVOLOG) 100 UNIT/ML injection Inject 4 Units into the skin 3 (three) times daily with meals. 06/11/23  Yes [name], Ripudeep K, MD  insulin aspart (NOVOLOG) 100 UNIT/ML injection Inject 0-9 Units into the skin 3 (three) times daily with meals. Sliding scale CBG 70 - 120: 0 units CBG 121 - 150: 1 unit,  CBG 151 - 200: 2 units,  CBG 201 - 250: 3 units,  CBG 251 - 300: 5 units,  CBG 301 -  350: 7 units,  CBG 351 - 400: 9 units   CBG > 400: 9 units and notify your MD 06/11/23  Yes [name], Ripudeep K, MD  insulin glargine (LANTUS) 100 UNIT/ML injection Inject 0.45 mLs (45 Units total) into the skin daily. Patient taking differently: Inject 30 Units into the skin daily. 06/11/23  Yes [name], Ripudeep K, MD  ipratropium-albuterol (DUONEB) 0.5-2.5 (3) MG/3ML SOLN Take 3 mLs by nebulization every 6 (six) hours as needed (for shortness of breath and wheezing).   Yes [provider]  latanoprost (XALATAN) 0.005 % ophthalmic solution Place 1 drop into both eyes at bedtime. 06/11/23  Yes [name], Ripudeep K, MD  Multiple Vitamin (MULTIVITAMIN WITH MINERALS) TABS tablet Take 1 tablet by mouth daily.   Yes [provider]  oxyCODONE-acetaminophen (PERCOCET/ROXICET) 5-325 MG tablet Take 1 tablet by mouth every 4 (four) hours as needed for severe pain. 06/11/23  Yes [name], Ripudeep K, MD  polyethylene glycol (MIRALAX / GLYCOLAX) 17 g packet Take 17 g  by mouth 2 (two) times daily. Patient taking differently: Take 17 g by mouth daily as needed for mild constipation. 04/30/23  Yes Kc, Dayna Barker, MD  senna-docusate (SENOKOT-S) 8.6-50 MG tablet Take 1 tablet by mouth 2 (two) times daily for 14 days. 06/11/23 06/25/23 Yes [name], Ripudeep K, MD  sodium bicarbonate 650 MG tablet Take 1 tablet (650 mg total) by mouth 2 (two) times daily. 06/11/23  Yes [name], Ripudeep K, MD  acetaminophen (TYLENOL) 325 MG tablet Take 650 mg by mouth every 4 (four) hours as needed for mild pain, moderate pain or headache.    [provider]  Dextrose-Fructose-Sod Citrate 4.35-4.17-0.921 GM/15ML LIQD Take 15 cm by mouth as needed (15cm (1 tube) by mouth as needed for hypoglycemia for  glucose less than 70, repeat every 15 minutes if blood sugar less than 70.).    [provider]  fluticasone-salmeterol (ADVAIR) 500-50 MCG/ACT AEPB Inhale 1 puff into the lungs in the morning and at bedtime. Patient not taking: Reported on 06/25/2023 04/30/23   Lanae Boast, MD  hydrOXYzine (ATARAX) 25 MG tablet Take 1 tablet (25 mg total) by mouth 3 (three) times daily as needed for itching or anxiety. 05/15/23   Rodolph Bong, MD  Insulin Syringe-Needle U-100 30G X 1/2" 0.5 ML MISC Use with Lantus 05/15/23   Rodolph Bong, MD  Magnesium Oxide 420 MG TABS Take 420 mg by mouth daily.    [provider]  mirtazapine (REMERON) 15 MG tablet Take 1 tablet (15 mg total) by mouth at bedtime. 06/11/23   [name], Delene Ruffini, MD  naloxone [facility]) nasal spray 4 mg/0.1 mL Place 1 spray into the nose once. Instill 1 spray into one nostril as directed for opioid overdose, call 911 immediately, administer dose, then turn person on side if no response in 2-3 minutes or person responds but relapses. Repeat using a new spray device and into the other nostril. Patient not taking: Reported on 05/27/2023    [provider]  nitroGLYCERIN (NITROSTAT) 0.4 MG SL tablet Place 1 tablet (0.4 mg total)  under the tongue every 5 (five) minutes as needed for chest pain. Patient not taking: Reported on 06/25/2023 01/29/22   Corrin Parker, PA-C  Omega-3 Fatty Acids (FISH OIL) 1000 MG CAPS Take 1,000 mg by mouth 2 (two) times daily.    [provider]  simethicone (MYLICON) 40 MG/0.6ML drops Take 0.6 mLs (40 mg total) by mouth every 6 (six) hours as needed for  flatulence. 06/11/23   [name], Delene Ruffini, MD  tadalafil (CIALIS) 10 MG tablet Take 1 tablet (10 mg total) by mouth daily as needed for erectile dysfunction. 10/21/22   Sharlene Dory, PA-C  thiamine (VITAMIN B-1) 100 MG tablet Take 1 tablet (100 mg total) by mouth daily. 05/16/23   Rodolph Bong, MD    Physical Exam: Vitals:   06/25/23 1620 06/25/23 1623 06/25/23 2015  BP:  129/65 119/63  Pulse:  82 81  Resp:  15 16  Temp:  99.1 F (37.3 C) 98.6 F (37 C)  TempSrc:  Oral Oral  SpO2: 96% 100% 96%    Physical Exam Vitals and nursing note reviewed.  Constitutional:      General: He is not in acute distress.    Appearance: He is not toxic-appearing.  HENT:     Head: Normocephalic and atraumatic.     Nose: Nose normal.  Cardiovascular:     Rate and Rhythm: Normal rate and regular rhythm.  Pulmonary:     Effort: Pulmonary effort is normal.  Abdominal:     General: Abdomen is flat. Bowel sounds are normal.     Palpations: Abdomen is soft.  Skin:    General: Skin is warm and dry.     Capillary Refill: Capillary refill takes less than 2 seconds.     Comments: Right BKA stump. Right lateral aspect of staple line with erythema and serosanguinous drainage.   Neurological:     General: No focal deficit present.     Mental Status: He is alert and oriented to person, place, and time.      Labs on Admission: I have personally reviewed following labs and imaging studies  CBC: Recent Labs  Lab 06/25/23 2001  WBC 9.3  NEUTROABS 6.3  HGB 7.8*  HCT 22.3*  MCV 77.4*  PLT 196   Basic Metabolic Panel: Recent Labs   Lab 06/25/23 1758  NA 128*  K 4.2  CL 95*  CO2 17*  GLUCOSE 157*  BUN 29*  CREATININE 1.60*  CALCIUM 8.8*   GFR: Estimated Creatinine Clearance: 44.3 mL/min (A) (by C-G formula based on SCr of 1.6 mg/dL (H)). Liver Function Tests: Recent Labs  Lab 06/25/23 1758  AST 24  ALT 25  ALKPHOS 76  BILITOT 0.7  PROT 6.5  ALBUMIN 2.9*   Coagulation Profile: Recent Labs  Lab 06/25/23 1757  INR 1.1   BNP (last 3 results) Recent Labs    10/10/22 2034  BNP 117.7*   Urine analysis:    Component Value Date/Time   COLORURINE YELLOW 06/25/2023 2137   APPEARANCEUR CLOUDY (A) 06/25/2023 2137   LABSPEC 1.020 06/25/2023 2137   PHURINE 5.0 06/25/2023 2137   GLUCOSEU NEGATIVE 06/25/2023 2137   HGBUR NEGATIVE 06/25/2023 2137   BILIRUBINUR NEGATIVE 06/25/2023 2137   KETONESUR NEGATIVE 06/25/2023 2137   PROTEINUR 100 (A) 06/25/2023 2137   UROBILINOGEN 1.0 07/23/2015 1403   NITRITE NEGATIVE 06/25/2023 2137   LEUKOCYTESUR NEGATIVE 06/25/2023 2137    Radiological Exams on Admission: I have personally reviewed images DG Knee 2 Views Right  Result Date: 06/25/2023 CLINICAL DATA:  Infection EXAM: RIGHT KNEE - 1-2 VIEW COMPARISON:  None Available. FINDINGS: Patient is status post below the knee amputation, partially visualized intramedullary rod within the tibia. Extensive vascular calcifications. Cut margins appear smooth. Soft tissue swelling at the distal stump without soft tissue emphysema. No osseous destructive change IMPRESSION: Status post below the knee amputation. Soft tissue swelling at the distal stump  without soft tissue emphysema or acute osseous abnormality. Electronically Signed   By: Jasmine Pang M.D.   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 Zosyn and vanco. EDP to contact vascular surgery. Pt is not opposed to having stump debrided but wants to talk to surgeon if he needs further amputation.  S/P BKA (below knee amputation) unilateral, right (HCC) - on 05-30-2023 Currently infected.  Benign prostatic hyperplasia Stable.  CAD (coronary artery disease) Continue with ASA.  COPD (chronic obstructive pulmonary disease) (HCC) Stable. Continue with inhalers.  Chronic kidney disease, stage 3b (HCC) Stable.  Dyslipidemia associated with type 2 diabetes mellitus (HCC) Continue with lipitor 80 mg.  Type 2 diabetes mellitus with complication, with long-term current use of insulin (HCC) Continue with lantus and SSI.  Chronic hyponatremia - baseline 125-134 Chronic.  Hypertension associated with diabetes (HCC) Continue with coreg 18.75 bid and norvasc 10 mg   DVT prophylaxis: SQ Heparin Code Status: Full Code Family Communication: no family at bedside  Disposition Plan: return to SNF  Consults called: EDP will consult vascular surgery  Admission status: Inpatient, Med-Surg   Carollee Herter, DO [facility] 06/25/2023, 10:08 PM

## 2023-06-25 NOTE — Assessment & Plan Note (Signed)
Continue with lipitor 80 mg.

## 2023-06-25 NOTE — ED Notes (Signed)
Patient has delay in antibiotic and fluid resuscitation d/t lack of IV placement. Multiple RNs attempting lines at this time and provider made aware.

## 2023-06-25 NOTE — Assessment & Plan Note (Signed)
Continue with lantus and SSI.

## 2023-06-25 NOTE — Progress Notes (Signed)
ED Pharmacy Antibiotic Sign Off An antibiotic consult was received from an ED provider for Vancomycin and Cefepime per pharmacy dosing for Sepsis of unknown source. A chart review was completed to assess appropriateness.   The following one time order(s) were placed:  Vancomycin 1250mg  IV once Cefepime 2g IV once  Further antibiotic and/or antibiotic pharmacy consults should be ordered by the admitting provider if indicated.   Thank you for allowing pharmacy to be a part of this patient's care.   Eldridge Scot, PharmD Clinical Pharmacist 06/25/2023, 4:57 PM

## 2023-06-25 NOTE — ED Provider Notes (Signed)
[place] EMERGENCY DEPARTMENT AT [facility] Provider Note   CSN: [id number] Arrival date & time: 06/25/23  1616     History  Chief Complaint  Patient presents with   Wound Infection     HPI    Patient is 62 year old male with past medical history of CAD s/p PCI, COPD, CKD stage IIIb, T2DM, HTN, HLD, PAD s/p right BKA on 05/30/23, presenting today due to pain, drainage, and concern for infection to his right BKA.  He states that he presented for a follow-up appointment at vascular surgery this morning.  Tenderness he was having drainage and skin changes to the area of his incision site.  He notes he has been having worsening pain over the last 3 to 4 days.  He also notes increased drainage from the lateral aspect.  His pain is increased over the most inferior aspect of his leg.  He has felt feverish but not taken his temperature.  He denies any nausea or vomiting.  He has no chest pain or shortness of breath.  He is not currently on any antibiotics.  Upon chart review, at the vascular surgery office this morning they noted concern for dehiscence of the lateral aspect of the incision as well as possible infection with gangrenous changes over the skin adjacent to his incision site.  He was sent here for further evaluation.  They have discussed possible need for an above-knee amputation.  Home Medications Prior to Admission medications   Medication Sig Start Date End Date Taking? Authorizing Provider  albuterol (PROVENTIL HFA;VENTOLIN HFA) 108 (90 BASE) MCG/ACT inhaler Inhale 2 puffs into the lungs every 6 (six) hours as needed for wheezing or shortness of breath.    Yes [provider]  amitriptyline (ELAVIL) 25 MG tablet Take 1 tablet (25 mg total) by mouth at bedtime. 05/15/23  Yes Rodolph Bong, MD  amLODipine (NORVASC) 10 MG tablet Take 1 tablet (10 mg total) by mouth daily. 03/12/21  Yes [name], Deloris Ping, MD  aspirin EC 81 MG tablet Take 81 mg by mouth daily.  Swallow whole.   Yes [provider]  atorvastatin (LIPITOR) 80 MG tablet Take 1 tablet (80 mg total) by mouth daily. Patient taking differently: Take 80 mg by mouth every evening. 01/30/22  Yes Marjie Skiff E, PA-C  bisacodyl (DULCOLAX) 5 MG EC tablet Take 1 tablet (5 mg total) by mouth daily as needed for moderate constipation. 06/11/23  Yes [name], Ripudeep K, MD  Brinzolamide-Brimonidine 1-0.2 % SUSP Place 1 drop into both eyes 3 (three) times daily.   Yes [provider]  carvedilol (COREG) 12.5 MG tablet Take 1.5 tablets (18.75 mg total) by mouth 2 (two) times daily with a meal. 06/11/23  Yes [name], Ripudeep K, MD  clopidogrel (PLAVIX) 75 MG tablet Take 1 tablet (75 mg total) by mouth daily with breakfast. 01/30/22  Yes Marjie Skiff E, PA-C  cyanocobalamin (VITAMIN B12) 500 MCG tablet Take 500 mcg by mouth daily.   Yes [provider]  famotidine (PEPCID) 20 MG tablet Take 1 tablet (20 mg total) by mouth daily. 06/12/23  Yes [name], Ripudeep K, MD  folic acid (FOLVITE) 1 MG tablet Take 1 tablet (1 mg total) by mouth daily. 05/16/23  Yes Rodolph Bong, MD  gabapentin (NEURONTIN) 100 MG capsule Take 1 capsule (100 mg total) by mouth 3 (three) times daily. 06/11/23  Yes [name], Ripudeep K, MD  insulin aspart (NOVOLOG) 100 UNIT/ML injection Inject 4 Units into the skin  3 (three) times daily with meals. 06/11/23  Yes [name], Ripudeep K, MD  insulin aspart (NOVOLOG) 100 UNIT/ML injection Inject 0-9 Units into the skin 3 (three) times daily with meals. Sliding scale CBG 70 - 120: 0 units CBG 121 - 150: 1 unit,  CBG 151 - 200: 2 units,  CBG 201 - 250: 3 units,  CBG 251 - 300: 5 units,  CBG 301 - 350: 7 units,  CBG 351 - 400: 9 units   CBG > 400: 9 units and notify your MD 06/11/23  Yes [name], Ripudeep K, MD  insulin glargine (LANTUS) 100 UNIT/ML injection Inject 0.45 mLs (45 Units total) into the skin daily. Patient taking differently: Inject 30 Units into the skin daily. 06/11/23  Yes [name],  Ripudeep K, MD  ipratropium-albuterol (DUONEB) 0.5-2.5 (3) MG/3ML SOLN Take 3 mLs by nebulization every 6 (six) hours as needed (for shortness of breath and wheezing).   Yes [provider]  latanoprost (XALATAN) 0.005 % ophthalmic solution Place 1 drop into both eyes at bedtime. 06/11/23  Yes [name], Ripudeep K, MD  Multiple Vitamin (MULTIVITAMIN WITH MINERALS) TABS tablet Take 1 tablet by mouth daily.   Yes [provider]  oxyCODONE-acetaminophen (PERCOCET/ROXICET) 5-325 MG tablet Take 1 tablet by mouth every 4 (four) hours as needed for severe pain. 06/11/23  Yes [name], Ripudeep K, MD  polyethylene glycol (MIRALAX / GLYCOLAX) 17 g packet Take 17 g by mouth 2 (two) times daily. Patient taking differently: Take 17 g by mouth daily as needed for mild constipation. 04/30/23  Yes Kc, Dayna Barker, MD  senna-docusate (SENOKOT-S) 8.6-50 MG tablet Take 1 tablet by mouth 2 (two) times daily for 14 days. 06/11/23 06/25/23 Yes [name], Ripudeep K, MD  sodium bicarbonate 650 MG tablet Take 1 tablet (650 mg total) by mouth 2 (two) times daily. 06/11/23  Yes [name], Ripudeep K, MD  acetaminophen (TYLENOL) 325 MG tablet Take 650 mg by mouth every 4 (four) hours as needed for mild pain, moderate pain or headache.    [provider]  Dextrose-Fructose-Sod Citrate 4.35-4.17-0.921 GM/15ML LIQD Take 15 cm by mouth as needed (15cm (1 tube) by mouth as needed for hypoglycemia for  glucose less than 70, repeat every 15 minutes if blood sugar less than 70.).    [provider]  fluticasone-salmeterol (ADVAIR) 500-50 MCG/ACT AEPB Inhale 1 puff into the lungs in the morning and at bedtime. Patient not taking: Reported on 06/25/2023 04/30/23   Lanae Boast, MD  hydrOXYzine (ATARAX) 25 MG tablet Take 1 tablet (25 mg total) by mouth 3 (three) times daily as needed for itching or anxiety. 05/15/23   Rodolph Bong, MD  Insulin Syringe-Needle U-100 30G X 1/2" 0.5 ML MISC Use with Lantus 05/15/23   Rodolph Bong, MD   Magnesium Oxide 420 MG TABS Take 420 mg by mouth daily.    [provider]  mirtazapine (REMERON) 15 MG tablet Take 1 tablet (15 mg total) by mouth at bedtime. 06/11/23   [name], Delene Ruffini, MD  naloxone [facility]) nasal spray 4 mg/0.1 mL Place 1 spray into the nose once. Instill 1 spray into one nostril as directed for opioid overdose, call 911 immediately, administer dose, then turn person on side if no response in 2-3 minutes or person responds but relapses. Repeat using a new spray device and into the other nostril. Patient not taking: Reported on 05/27/2023    [provider]  nitroGLYCERIN (NITROSTAT) 0.4 MG SL tablet Place 1 tablet (0.4 mg  total) under the tongue every 5 (five) minutes as needed for chest pain. Patient not taking: Reported on 06/25/2023 01/29/22   Corrin Parker, PA-C  Omega-3 Fatty Acids (FISH OIL) 1000 MG CAPS Take 1,000 mg by mouth 2 (two) times daily.    [provider]  simethicone (MYLICON) 40 MG/0.6ML drops Take 0.6 mLs (40 mg total) by mouth every 6 (six) hours as needed for flatulence. 06/11/23   [name], Delene Ruffini, MD  tadalafil (CIALIS) 10 MG tablet Take 1 tablet (10 mg total) by mouth daily as needed for erectile dysfunction. 10/21/22   Sharlene Dory, PA-C  thiamine (VITAMIN B-1) 100 MG tablet Take 1 tablet (100 mg total) by mouth daily. 05/16/23   Rodolph Bong, MD      Allergies    Lisinopril-hydrochlorothiazide and Simvastatin    Review of Systems   Negative for as noted above in HPI  Physical Exam Updated Vital Signs BP 126/63 (BP Location: Right Arm)   Pulse 80   Temp 99.1 F (37.3 C) (Oral)   Resp 14   Ht 5\' 11"  (1.803 m)   Wt 65.8 kg   SpO2 98%   BMI 20.22 kg/m  Physical Exam Vitals and nursing note reviewed.  Constitutional:      General: He is not in acute distress.    Appearance: He is well-developed.  HENT:     Head: Normocephalic and atraumatic.  Eyes:     Conjunctiva/sclera: Conjunctivae normal.   Cardiovascular:     Rate and Rhythm: Normal rate and regular rhythm.     Heart sounds: No murmur heard. Pulmonary:     Effort: Pulmonary effort is normal. No respiratory distress.     Breath sounds: Normal breath sounds.  Abdominal:     Palpations: Abdomen is soft.     Tenderness: There is no abdominal tenderness.  Musculoskeletal:     Cervical back: Neck supple.     Comments: Right-sided BKA with staples in the incision line along the inferior anterior aspect aspect in the chart from vascular surgery visit earlier today.  He does have bloody and purulent drainage from the lateral aspect.  He has exquisite tenderness over the most inferior aspect of the amputation site.  Skin:    General: Skin is warm and dry.     Capillary Refill: Capillary refill takes less than 2 seconds.     Comments: Hyperpigmentation of the skin at the incision site with erythema noted to the inferior aspect of the stump.  Neurological:     Mental Status: He is alert.  Psychiatric:        Mood and Affect: Mood normal.     ED Results / Procedures / Treatments   Labs (all labs ordered are listed, but only abnormal results are displayed) Labs Reviewed  COMPREHENSIVE METABOLIC PANEL - Abnormal; Notable for the following components:      Result Value   Sodium 128 (*)    Chloride 95 (*)    CO2 17 (*)    Glucose, Bld 157 (*)    BUN 29 (*)    Creatinine, Ser 1.60 (*)    Calcium 8.8 (*)    Albumin 2.9 (*)    GFR, Estimated 48 (*)    Anion gap 16 (*)    All other components within normal limits  URINALYSIS, W/ REFLEX TO CULTURE (INFECTION SUSPECTED) - Abnormal; Notable for the following components:   APPearance CLOUDY (*)    Protein, ur 100 (*)  Bacteria, UA RARE (*)    All other components within normal limits  APTT - Abnormal; Notable for the following components:   aPTT 38 (*)    All other components within normal limits  CBC WITH DIFFERENTIAL/PLATELET - Abnormal; Notable for the following  components:   RBC 2.88 (*)    Hemoglobin 7.8 (*)    HCT 22.3 (*)    MCV 77.4 (*)    All other components within normal limits  I-STAT CG4 LACTIC ACID, ED - Abnormal; Notable for the following components:   Lactic Acid, Venous 2.0 (*)    All other components within normal limits  CULTURE, BLOOD (ROUTINE X 2)  CULTURE, BLOOD (ROUTINE X 2)  PROTIME-INR  CBC WITH DIFFERENTIAL/PLATELET  I-STAT CG4 LACTIC ACID, ED    EKG None  Radiology DG Knee 2 Views Right  Result Date: 06/25/2023 CLINICAL DATA:  Infection EXAM: RIGHT KNEE - 1-2 VIEW COMPARISON:  None Available. FINDINGS: Patient is status post below the knee amputation, partially visualized intramedullary rod within the tibia. Extensive vascular calcifications. Cut margins appear smooth. Soft tissue swelling at the distal stump without soft tissue emphysema. No osseous destructive change IMPRESSION: Status post below the knee amputation. Soft tissue swelling at the distal stump without soft tissue emphysema or acute osseous abnormality. Electronically Signed   By: Jasmine Pang M.D.   On: 06/25/2023 17:30      Medications Ordered in ED Medications  HYDROmorphone (DILAUDID) injection 0.5 mg (0.5 mg Intramuscular Not Given 06/25/23 1747)  piperacillin-tazobactam (ZOSYN) IVPB 3.375 g (has no administration in time range)  vancomycin (VANCOCIN) IVPB 1000 mg/200 mL premix (has no administration in time range)  methocarbamol (ROBAXIN) tablet 500 mg (has no administration in time range)  lactated ringers bolus 1,000 mL (0 mLs Intravenous Stopped 06/25/23 1953)  ceFEPIme (MAXIPIME) 2 g in sodium chloride 0.9 % 100 mL IVPB (0 g Intravenous Stopped 06/25/23 1944)  metroNIDAZOLE (FLAGYL) IVPB 500 mg (0 mg Intravenous Stopped 06/25/23 1923)  vancomycin (VANCOREADY) IVPB 1250 mg/250 mL (0 mg Intravenous Stopped 06/25/23 1952)  HYDROmorphone (DILAUDID) injection 0.5 mg (0.5 mg Intravenous Given 06/25/23 2018)  lactated ringers bolus 1,000 mL (1,000 mLs  Intravenous New Bag/Given 06/25/23 2305)  HYDROmorphone (DILAUDID) injection 0.5 mg (0.5 mg Intravenous Given 06/25/23 2308)  HYDROmorphone (DILAUDID) 1 MG/ML injection (1 mg  Given 06/25/23 2307)    ED Course/ Medical Decision Making/ A&P Clinical Course as of 06/25/23 2348  Thu Jun 25, 2023  2159 Spoke with Dr. Chestine Spore with vascular surgery who is aware of the patient, and team will evaluate him tomorrow [CD]    Clinical Course User Index [CD] Rhys Martini, DO                                Medical Decision Making Amount and/or Complexity of Data Reviewed Labs: ordered. Radiology: ordered. ECG/medicine tests: ordered.  Risk Prescription drug management. Decision regarding hospitalization.    Patient is 62 year old male with past medical history of CAD s/p PCI, COPD, CKD stage IIIb, T2DM, HTN, HLD, PAD s/p right BKA on 05/30/23, presenting today due to pain, drainage, and concern for infection to his right BKA.  On exam, he is in regular rate and rhythm.  Lungs clear to auscultation.  He does have skin changes with drainage from the lateral aspect of his incision site as well as exquisite tenderness is further described above.  Presentation concerning for surgical site  infection, abscess formation, osteomyelitis, electrolyte derangement, DKA, cellulitis.  No evidence of septic shock at this time.  Sepsis protocol is initiated due to concern for reported fever at home with source of infection at his right BKA site.  Blood cultures were obtained and patient started on antibiotics (vancomycin, Flagyl, and cefepime per protocol).  X-ray of the right knee reveals soft tissue swelling at the distal stump without soft tissue emphysema or acute osseous abnormality.  EKG shows sinus rhythm at 86 bpm without any acute ST or T wave abnormalities.  Lab work reveals mildly decreased bicarb to 17.  Creatinine is increased to 1.6.  Hemoglobin 7.8.  Patient is hyponatremic at 128.  Initial lactic  acid is 2.  Patient is given 1 L IV fluids with improvement to 1.1.  Given concern for possible sepsis secondary to wound infection of his right BKA, patient will be admitted.  Discussed case with hospitalist team who is agreeable with plan.  Spoke with Dr. Chestine Spore with vascular surgery who is aware the patient will plan to have the team evaluate him tomorrow.  He is hemodynamically stable and appropriate for transfer to the floor.   Final Clinical Impression(s) / ED Diagnoses Final diagnoses:  Wound infection    Rx / DC Orders ED Discharge Orders     None         Rhys Martini, DO 06/25/23 2348    Gerhard Munch, MD 06/28/23 1506

## 2023-06-25 NOTE — Progress Notes (Signed)
Pharmacy Antibiotic Note  Jose [name] is a 62 y.o. male admitted on 06/25/2023 with wound infection.  Pharmacy has been consulted for vancomycin and zosyn dosing.  Plan: Vancomycin 1250mg  IV once then 1000mg  Q24H (eAUC 454, Scr 1.6, Vd 0.72) Zosyn 3.375g Q8H     Temp (24hrs), Avg:98.6 F (37 C), Min:98 F (36.7 C), Max:99.1 F (37.3 C)  Recent Labs  Lab 06/25/23 1746 06/25/23 1758 06/25/23 2001 06/25/23 2017  WBC  --   --  9.3  --   CREATININE  --  1.60*  --   --   LATICACIDVEN 2.0*  --   --  1.1    Estimated Creatinine Clearance: 44.3 mL/min (A) (by C-G formula based on SCr of 1.6 mg/dL (H)).    Allergies  Allergen Reactions   Lisinopril-Hydrochlorothiazide Swelling and Other (See Comments)    Causes swelling of lips   Simvastatin Other (See Comments)    ALT (SGPT) level raised, Aspartate aminotransferase serum level raised    Thank you for allowing pharmacy to be a part of this patient's care.  Eldridge Scot, PharmD Clinical Pharmacist 06/25/2023, 10:22 PM

## 2023-06-25 NOTE — Assessment & Plan Note (Signed)
Currently infected.

## 2023-06-25 NOTE — ED Triage Notes (Signed)
Patient BIB [facility] from vascular office for wound infection after recent amputation. Patient A&Ox4, 7/10 pain to right leg. HR 86 BP 140/78, EMS reports patient did not feel febrile.

## 2023-06-26 DIAGNOSIS — T8743 Infection of amputation stump, right lower extremity: Secondary | ICD-10-CM | POA: Diagnosis not present

## 2023-06-26 DIAGNOSIS — D509 Iron deficiency anemia, unspecified: Secondary | ICD-10-CM | POA: Diagnosis not present

## 2023-06-26 DIAGNOSIS — E11649 Type 2 diabetes mellitus with hypoglycemia without coma: Secondary | ICD-10-CM

## 2023-06-26 DIAGNOSIS — I10 Essential (primary) hypertension: Secondary | ICD-10-CM | POA: Diagnosis not present

## 2023-06-26 LAB — COMPREHENSIVE METABOLIC PANEL
ALT: 24 U/L (ref 0–44)
AST: 18 U/L (ref 15–41)
Albumin: 2.3 g/dL — ABNORMAL LOW (ref 3.5–5.0)
Alkaline Phosphatase: 67 U/L (ref 38–126)
Anion gap: 15 (ref 5–15)
BUN: 25 mg/dL — ABNORMAL HIGH (ref 8–23)
CO2: 19 mmol/L — ABNORMAL LOW (ref 22–32)
Calcium: 8.6 mg/dL — ABNORMAL LOW (ref 8.9–10.3)
Chloride: 97 mmol/L — ABNORMAL LOW (ref 98–111)
Creatinine, Ser: 1.45 mg/dL — ABNORMAL HIGH (ref 0.61–1.24)
GFR, Estimated: 54 mL/min — ABNORMAL LOW (ref >60)
Glucose, Bld: 63 mg/dL — ABNORMAL LOW (ref 70–99)
Potassium: 3.6 mmol/L (ref 3.5–5.1)
Sodium: 131 mmol/L — ABNORMAL LOW (ref 135–145)
Total Bilirubin: 0.5 mg/dL (ref 0.3–1.2)
Total Protein: 5.3 g/dL — ABNORMAL LOW (ref 6.5–8.1)

## 2023-06-26 LAB — CBC WITH DIFFERENTIAL/PLATELET
Abs Immature Granulocytes: 0.05 10*3/uL (ref 0.00–0.07)
Basophils Absolute: 0 10*3/uL (ref 0.0–0.1)
Basophils Relative: 0 %
Eosinophils Absolute: 0.2 10*3/uL (ref 0.0–0.5)
Eosinophils Relative: 2 %
HCT: 22.4 % — ABNORMAL LOW (ref 39.0–52.0)
Hemoglobin: 7.7 g/dL — ABNORMAL LOW (ref 13.0–17.0)
Immature Granulocytes: 1 %
Lymphocytes Relative: 21 %
Lymphs Abs: 1.8 10*3/uL (ref 0.7–4.0)
MCH: 25.9 pg — ABNORMAL LOW (ref 26.0–34.0)
MCHC: 34.4 g/dL (ref 30.0–36.0)
MCV: 75.4 fL — ABNORMAL LOW (ref 80.0–100.0)
Monocytes Absolute: 0.9 10*3/uL (ref 0.1–1.0)
Monocytes Relative: 10 %
Neutro Abs: 6 10*3/uL (ref 1.7–7.7)
Neutrophils Relative %: 66 %
Platelets: 195 10*3/uL (ref 150–400)
RBC: 2.97 MIL/uL — ABNORMAL LOW (ref 4.22–5.81)
RDW: 14.1 % (ref 11.5–15.5)
WBC: 9 10*3/uL (ref 4.0–10.5)
nRBC: 0 % (ref 0.0–0.2)

## 2023-06-26 LAB — GLUCOSE, CAPILLARY
Glucose-Capillary: 152 mg/dL — ABNORMAL HIGH (ref 70–99)
Glucose-Capillary: 181 mg/dL — ABNORMAL HIGH (ref 70–99)

## 2023-06-26 LAB — PROCALCITONIN: Procalcitonin: 0.92 ng/mL

## 2023-06-26 LAB — CBG MONITORING, ED
Glucose-Capillary: 52 mg/dL — ABNORMAL LOW (ref 70–99)
Glucose-Capillary: 142 mg/dL — ABNORMAL HIGH (ref 70–99)

## 2023-06-26 LAB — MAGNESIUM: Magnesium: 1.2 mg/dL — ABNORMAL LOW (ref 1.7–2.4)

## 2023-06-26 MED ORDER — CARVEDILOL 6.25 MG PO TABS
18.7500 mg | ORAL_TABLET | Freq: Two times a day (BID) | ORAL | Status: DC
  Administered 2023-06-26 – 2023-06-27 (×3): 18.75 mg via ORAL
  Filled 2023-06-26: qty 2
  Filled 2023-06-26 (×2): qty 1

## 2023-06-26 MED ORDER — ATORVASTATIN CALCIUM 80 MG PO TABS
80.0000 mg | ORAL_TABLET | Freq: Every evening | ORAL | Status: DC
  Administered 2023-06-26 – 2023-07-08 (×13): 80 mg via ORAL
  Filled 2023-06-26 (×12): qty 1

## 2023-06-26 MED ORDER — SENNOSIDES-DOCUSATE SODIUM 8.6-50 MG PO TABS
2.0000 | ORAL_TABLET | Freq: Two times a day (BID) | ORAL | Status: DC
  Administered 2023-06-26 – 2023-07-09 (×25): 2 via ORAL
  Filled 2023-06-26 (×25): qty 2

## 2023-06-26 MED ORDER — CYCLOBENZAPRINE HCL 5 MG PO TABS
5.0000 mg | ORAL_TABLET | Freq: Three times a day (TID) | ORAL | Status: DC | PRN
  Administered 2023-06-26 – 2023-07-08 (×9): 5 mg via ORAL
  Filled 2023-06-26 (×9): qty 1

## 2023-06-26 MED ORDER — OXYCODONE-ACETAMINOPHEN 5-325 MG PO TABS
1.0000 | ORAL_TABLET | ORAL | Status: DC | PRN
  Administered 2023-06-26 – 2023-06-29 (×7): 1 via ORAL
  Filled 2023-06-26 (×7): qty 1

## 2023-06-26 MED ORDER — MIRTAZAPINE 15 MG PO TABS
15.0000 mg | ORAL_TABLET | Freq: Every day | ORAL | Status: DC
  Administered 2023-06-26 – 2023-06-28 (×4): 15 mg via ORAL
  Filled 2023-06-26 (×4): qty 1

## 2023-06-26 MED ORDER — ACETAMINOPHEN 650 MG RE SUPP: 650.0000 mg | Freq: Four times a day (QID) | RECTAL | Status: DC | PRN

## 2023-06-26 MED ORDER — POLYETHYLENE GLYCOL 3350 17 G PO PACK
17.0000 g | PACK | Freq: Two times a day (BID) | ORAL | Status: DC
  Administered 2023-06-26 – 2023-07-09 (×18): 17 g via ORAL
  Filled 2023-06-26 (×23): qty 1

## 2023-06-26 MED ORDER — PANTOPRAZOLE SODIUM 40 MG PO TBEC
40.0000 mg | DELAYED_RELEASE_TABLET | Freq: Every day | ORAL | Status: DC
  Administered 2023-06-26 – 2023-07-09 (×14): 40 mg via ORAL
  Filled 2023-06-26 (×14): qty 1

## 2023-06-26 MED ORDER — INSULIN GLARGINE-YFGN 100 UNIT/ML ~~LOC~~ SOLN
30.0000 [IU] | Freq: Every day | SUBCUTANEOUS | Status: DC
  Filled 2023-06-26 (×2): qty 0.3

## 2023-06-26 MED ORDER — OXYCODONE-ACETAMINOPHEN 5-325 MG PO TABS
1.0000 | ORAL_TABLET | ORAL | Status: DC | PRN
  Administered 2023-06-26 (×2): 1 via ORAL
  Filled 2023-06-26 (×3): qty 1

## 2023-06-26 MED ORDER — ONDANSETRON HCL 4 MG PO TABS: 4.0000 mg | ORAL_TABLET | Freq: Four times a day (QID) | ORAL | Status: DC | PRN

## 2023-06-26 MED ORDER — HEPARIN SODIUM (PORCINE) 5000 UNIT/ML IJ SOLN
5000.0000 [IU] | Freq: Three times a day (TID) | INTRAMUSCULAR | Status: DC
  Administered 2023-06-26 – 2023-07-09 (×40): 5000 [IU] via SUBCUTANEOUS
  Filled 2023-06-26 (×41): qty 1

## 2023-06-26 MED ORDER — CLOPIDOGREL BISULFATE 75 MG PO TABS
75.0000 mg | ORAL_TABLET | Freq: Every day | ORAL | Status: DC
  Administered 2023-06-26 – 2023-07-09 (×13): 75 mg via ORAL
  Filled 2023-06-26 (×13): qty 1

## 2023-06-26 MED ORDER — INSULIN ASPART 100 UNIT/ML IJ SOLN: 4.0000 [IU] | Freq: Three times a day (TID) | INTRAMUSCULAR | Status: DC

## 2023-06-26 MED ORDER — SODIUM BICARBONATE 650 MG PO TABS
650.0000 mg | ORAL_TABLET | Freq: Two times a day (BID) | ORAL | Status: DC
  Administered 2023-06-26 – 2023-07-09 (×28): 650 mg via ORAL
  Filled 2023-06-26 (×30): qty 1

## 2023-06-26 MED ORDER — DEXTROSE 50 % IV SOLN
25.0000 mL | Freq: Once | INTRAVENOUS | Status: AC
  Administered 2023-06-26: 25 mL via INTRAVENOUS
  Filled 2023-06-26: qty 50

## 2023-06-26 MED ORDER — AMLODIPINE BESYLATE 10 MG PO TABS
10.0000 mg | ORAL_TABLET | Freq: Every day | ORAL | Status: DC
  Administered 2023-06-26: 10 mg via ORAL
  Filled 2023-06-26: qty 2

## 2023-06-26 MED ORDER — ASPIRIN 81 MG PO TBEC
81.0000 mg | DELAYED_RELEASE_TABLET | Freq: Every day | ORAL | Status: DC
  Administered 2023-06-26 – 2023-07-09 (×13): 81 mg via ORAL
  Filled 2023-06-26 (×13): qty 1

## 2023-06-26 MED ORDER — IPRATROPIUM-ALBUTEROL 0.5-2.5 (3) MG/3ML IN SOLN: 3.0000 mL | Freq: Four times a day (QID) | RESPIRATORY_TRACT | Status: DC | PRN

## 2023-06-26 MED ORDER — INSULIN ASPART 100 UNIT/ML IJ SOLN
0.0000 [IU] | Freq: Three times a day (TID) | INTRAMUSCULAR | Status: DC
  Administered 2023-06-26 (×2): 2 [IU] via SUBCUTANEOUS
  Administered 2023-06-27: 3 [IU] via SUBCUTANEOUS
  Administered 2023-06-27: 2 [IU] via SUBCUTANEOUS
  Administered 2023-06-27: 3 [IU] via SUBCUTANEOUS
  Administered 2023-06-28: 5 [IU] via SUBCUTANEOUS
  Administered 2023-06-28 (×2): 3 [IU] via SUBCUTANEOUS
  Administered 2023-06-29: 5 [IU] via SUBCUTANEOUS
  Administered 2023-06-29: 2 [IU] via SUBCUTANEOUS
  Administered 2023-06-29: 5 [IU] via SUBCUTANEOUS
  Administered 2023-06-30 (×3): 3 [IU] via SUBCUTANEOUS
  Administered 2023-07-01: 5 [IU] via SUBCUTANEOUS
  Administered 2023-07-01: 1 [IU] via SUBCUTANEOUS
  Administered 2023-07-02: 5 [IU] via SUBCUTANEOUS
  Administered 2023-07-02: 3 [IU] via SUBCUTANEOUS
  Administered 2023-07-02: 7 [IU] via SUBCUTANEOUS
  Administered 2023-07-03: 5 [IU] via SUBCUTANEOUS
  Administered 2023-07-03 – 2023-07-04 (×2): 2 [IU] via SUBCUTANEOUS
  Administered 2023-07-04: 3 [IU] via SUBCUTANEOUS
  Administered 2023-07-05: 2 [IU] via SUBCUTANEOUS
  Administered 2023-07-06: 1 [IU] via SUBCUTANEOUS
  Administered 2023-07-07 – 2023-07-08 (×2): 2 [IU] via SUBCUTANEOUS
  Administered 2023-07-08 – 2023-07-09 (×2): 1 [IU] via SUBCUTANEOUS

## 2023-06-26 MED ORDER — ONDANSETRON HCL 4 MG/2ML IJ SOLN
4.0000 mg | Freq: Four times a day (QID) | INTRAMUSCULAR | Status: DC | PRN
  Filled 2023-06-26: qty 2

## 2023-06-26 MED ORDER — ACETAMINOPHEN 325 MG PO TABS
650.0000 mg | ORAL_TABLET | Freq: Four times a day (QID) | ORAL | Status: DC | PRN
  Administered 2023-06-26 – 2023-07-07 (×3): 650 mg via ORAL
  Filled 2023-06-26 (×3): qty 2

## 2023-06-26 MED ORDER — MAGNESIUM SULFATE 2 GM/50ML IV SOLN
2.0000 g | Freq: Once | INTRAVENOUS | Status: AC
  Administered 2023-06-26: 2 g via INTRAVENOUS
  Filled 2023-06-26: qty 50

## 2023-06-26 MED ORDER — FLEET ENEMA 7-19 GM/118ML RE ENEM
1.0000 | ENEMA | Freq: Once | RECTAL | Status: DC
  Filled 2023-06-26: qty 1

## 2023-06-26 MED ORDER — GABAPENTIN 100 MG PO CAPS
100.0000 mg | ORAL_CAPSULE | Freq: Three times a day (TID) | ORAL | Status: DC
  Administered 2023-06-26 – 2023-07-09 (×40): 100 mg via ORAL
  Filled 2023-06-26 (×40): qty 1

## 2023-06-26 MED ORDER — HYDROXYZINE HCL 25 MG PO TABS
25.0000 mg | ORAL_TABLET | Freq: Three times a day (TID) | ORAL | Status: DC | PRN
  Administered 2023-06-26 – 2023-07-09 (×5): 25 mg via ORAL
  Filled 2023-06-26 (×5): qty 1

## 2023-06-26 MED ORDER — AMITRIPTYLINE HCL 50 MG PO TABS
25.0000 mg | ORAL_TABLET | Freq: Every day | ORAL | Status: DC
  Administered 2023-06-26 – 2023-07-08 (×14): 25 mg via ORAL
  Filled 2023-06-26 (×14): qty 1

## 2023-06-26 MED ORDER — FAMOTIDINE 20 MG PO TABS
20.0000 mg | ORAL_TABLET | Freq: Every day | ORAL | Status: DC
  Administered 2023-06-26 – 2023-07-09 (×14): 20 mg via ORAL
  Filled 2023-06-26 (×14): qty 1

## 2023-06-26 MED ORDER — HYDROMORPHONE HCL 1 MG/ML IJ SOLN
0.5000 mg | INTRAMUSCULAR | Status: DC | PRN
  Administered 2023-06-27 – 2023-07-02 (×21): 0.5 mg via INTRAVENOUS
  Filled 2023-06-26 (×21): qty 0.5

## 2023-06-26 NOTE — Consult Note (Signed)
Hospital Consult    Reason for Consult:  Right BKA infection Referring Physician:  ED MRN #:  [id number]  History of Present Illness: This is a 62 y.o. male that vascular surgery has been consulted for infection of the right below-knee amputation.  Patient previously underwent shockwave lithotripsy with DCB of the right popliteal artery on 04/27/2023 by Dr. Randie Heinz.  He then had a right TMA on 05/12/2023.  This was nonhealing and he was converted to a BKA on 05/30/23.  Seen in the office today and then recommended admission for nonhealing BKA.  Past Medical History:  Diagnosis Date   Alcohol abuse    CAD (coronary artery disease)    a. Reported MI in 2012 s/p 2 stents;  b. 08/2012 Cath: LM 20, LAD 20 diff ISR, jailed septal - 99%, LCX 30ost, RI large, min irregs, RCA 30p, 20-30 ISR-->Med Rx; c. 2017 [facility].   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   Gangrene of right foot (HCC) 04/24/2023   Hyperlipidemia    Hypertension    Hypertensive urgency 02/11/2021   Metabolic acidosis 05/11/2023   Noncompliance     Past Surgical History:  Procedure Laterality Date   ABDOMINAL AORTOGRAM W/LOWER EXTREMITY N/A 04/27/2023   Procedure: ABDOMINAL AORTOGRAM W/LOWER EXTREMITY;  Surgeon: Maeola Harman, MD;  Location: [facility] INVASIVE CV LAB;  Service: Cardiovascular;  Laterality: N/A;   AMPUTATION Right 05/30/2023   Procedure: AMPUTATION BELOW KNEE;  Surgeon: Maeola Harman, MD;  Location: [facility] OR;  Service: Vascular;  Laterality: Right;   CARDIAC CATHETERIZATION     CORONARY STENT PLACEMENT     FRACTURE SURGERY Right    hardware from right leg fracture   LEFT HEART CATH AND CORONARY ANGIOGRAPHY N/A 08/22/2020   Procedure: LEFT HEART CATH AND CORONARY  ANGIOGRAPHY;  Surgeon: Lyn Records, MD;  Location: [facility] INVASIVE CV LAB;  Service: Cardiovascular;  Laterality: N/A;   LEFT HEART CATH AND CORONARY ANGIOGRAPHY N/A 01/28/2022   Procedure: LEFT HEART CATH AND CORONARY ANGIOGRAPHY;  Surgeon: Lennette Bihari, MD;  Location: [facility] INVASIVE CV LAB;  Service: Cardiovascular;  Laterality: N/A;   LEFT HEART CATHETERIZATION WITH CORONARY ANGIOGRAM Bilateral 08/25/2012   Procedure: LEFT HEART CATHETERIZATION WITH CORONARY ANGIOGRAM;  Surgeon: Kathleene Hazel, MD;  Location: [facility] CATH LAB;  Service: Cardiovascular;  Laterality: Bilateral;   PERIPHERAL INTRAVASCULAR LITHOTRIPSY  04/27/2023   Procedure: PERIPHERAL INTRAVASCULAR LITHOTRIPSY;  Surgeon: Maeola Harman, MD;  Location: [facility] INVASIVE CV LAB;  Service: Cardiovascular;;   PERIPHERAL VASCULAR BALLOON ANGIOPLASTY  04/27/2023   Procedure: PERIPHERAL VASCULAR BALLOON ANGIOPLASTY;  Surgeon: Maeola Harman, MD;  Location: [facility] INVASIVE CV LAB;  Service: Cardiovascular;;   TRANSMETATARSAL AMPUTATION Right 05/12/2023   Procedure: TRANSMETATARSAL AMPUTATION;  Surgeon: Chuck Hint, MD;  Location: [facility] OR;  Service: Vascular;  Laterality: Right;    Allergies  Allergen Reactions   Lisinopril-Hydrochlorothiazide Swelling and Other (See Comments)    Causes swelling of lips   Simvastatin Other (See Comments)    ALT (SGPT) level raised, Aspartate aminotransferase serum level raised    Prior to Admission medications   Medication Sig Start Date End Date Taking? Authorizing Provider  albuterol (PROVENTIL HFA;VENTOLIN HFA) 108 (90 BASE) MCG/ACT inhaler Inhale 2 puffs into  the lungs every 6 (six) hours as needed for wheezing or shortness of breath.    Yes [provider]  amitriptyline (ELAVIL) 25 MG tablet Take 1 tablet (25 mg total) by mouth at bedtime. 05/15/23  Yes Rodolph Bong, MD  amLODipine (NORVASC) 10 MG tablet Take 1 tablet (10 mg total) by mouth daily. 03/12/21   Yes [name], Deloris Ping, MD  aspirin EC 81 MG tablet Take 81 mg by mouth daily. Swallow whole.   Yes [provider]  atorvastatin (LIPITOR) 80 MG tablet Take 1 tablet (80 mg total) by mouth daily. Patient taking differently: Take 80 mg by mouth every evening. 01/30/22  Yes Marjie Skiff E, PA-C  bisacodyl (DULCOLAX) 5 MG EC tablet Take 1 tablet (5 mg total) by mouth daily as needed for moderate constipation. 06/11/23  Yes [name], Ripudeep K, MD  Brinzolamide-Brimonidine 1-0.2 % SUSP Place 1 drop into both eyes 3 (three) times daily.   Yes [provider]  carvedilol (COREG) 12.5 MG tablet Take 1.5 tablets (18.75 mg total) by mouth 2 (two) times daily with a meal. 06/11/23  Yes [name], Ripudeep K, MD  clopidogrel (PLAVIX) 75 MG tablet Take 1 tablet (75 mg total) by mouth daily with breakfast. 01/30/22  Yes Marjie Skiff E, PA-C  cyanocobalamin (VITAMIN B12) 500 MCG tablet Take 500 mcg by mouth daily.   Yes [provider]  famotidine (PEPCID) 20 MG tablet Take 1 tablet (20 mg total) by mouth daily. 06/12/23  Yes [name], Ripudeep K, MD  folic acid (FOLVITE) 1 MG tablet Take 1 tablet (1 mg total) by mouth daily. 05/16/23  Yes Rodolph Bong, MD  gabapentin (NEURONTIN) 100 MG capsule Take 1 capsule (100 mg total) by mouth 3 (three) times daily. 06/11/23  Yes [name], Ripudeep K, MD  insulin aspart (NOVOLOG) 100 UNIT/ML injection Inject 4 Units into the skin 3 (three) times daily with meals. 06/11/23  Yes [name], Ripudeep K, MD  insulin aspart (NOVOLOG) 100 UNIT/ML injection Inject 0-9 Units into the skin 3 (three) times daily with meals. Sliding scale CBG 70 - 120: 0 units CBG 121 - 150: 1 unit,  CBG 151 - 200: 2 units,  CBG 201 - 250: 3 units,  CBG 251 - 300: 5 units,  CBG 301 - 350: 7 units,  CBG 351 - 400: 9 units   CBG > 400: 9 units and notify your MD 06/11/23  Yes [name], Ripudeep K, MD  insulin glargine (LANTUS) 100 UNIT/ML injection Inject 0.45 mLs (45 Units total) into the skin  daily. Patient taking differently: Inject 30 Units into the skin daily. 06/11/23  Yes [name], Ripudeep K, MD  ipratropium-albuterol (DUONEB) 0.5-2.5 (3) MG/3ML SOLN Take 3 mLs by nebulization every 6 (six) hours as needed (for shortness of breath and wheezing).   Yes [provider]  latanoprost (XALATAN) 0.005 % ophthalmic solution Place 1 drop into both eyes at bedtime. 06/11/23  Yes [name], Ripudeep K, MD  Multiple Vitamin (MULTIVITAMIN WITH MINERALS) TABS tablet Take 1 tablet by mouth daily.   Yes [provider]  oxyCODONE-acetaminophen (PERCOCET/ROXICET) 5-325 MG tablet Take 1 tablet by mouth every 4 (four) hours as needed for severe pain. 06/11/23  Yes [name], Ripudeep K, MD  polyethylene glycol (MIRALAX / GLYCOLAX) 17 g packet Take 17 g by mouth 2 (two) times daily. Patient taking differently: Take 17 g by mouth daily as needed for mild constipation. 04/30/23  Yes Lanae Boast, MD  sodium bicarbonate 650 MG tablet Take  1 tablet (650 mg total) by mouth 2 (two) times daily. 06/11/23  Yes [name], Ripudeep K, MD  acetaminophen (TYLENOL) 325 MG tablet Take 650 mg by mouth every 4 (four) hours as needed for mild pain, moderate pain or headache.    [provider]  Dextrose-Fructose-Sod Citrate 4.35-4.17-0.921 GM/15ML LIQD Take 15 cm by mouth as needed (15cm (1 tube) by mouth as needed for hypoglycemia for  glucose less than 70, repeat every 15 minutes if blood sugar less than 70.).    [provider]  fluticasone-salmeterol (ADVAIR) 500-50 MCG/ACT AEPB Inhale 1 puff into the lungs in the morning and at bedtime. Patient not taking: Reported on 06/25/2023 04/30/23   Lanae Boast, MD  hydrOXYzine (ATARAX) 25 MG tablet Take 1 tablet (25 mg total) by mouth 3 (three) times daily as needed for itching or anxiety. 05/15/23   Rodolph Bong, MD  Insulin Syringe-Needle U-100 30G X 1/2" 0.5 ML MISC Use with Lantus 05/15/23   Rodolph Bong, MD  Magnesium Oxide 420 MG TABS Take 420 mg by mouth  daily.    [provider]  mirtazapine (REMERON) 15 MG tablet Take 1 tablet (15 mg total) by mouth at bedtime. 06/11/23   [name], Delene Ruffini, MD  naloxone [facility]) nasal spray 4 mg/0.1 mL Place 1 spray into the nose once. Instill 1 spray into one nostril as directed for opioid overdose, call 911 immediately, administer dose, then turn person on side if no response in 2-3 minutes or person responds but relapses. Repeat using a new spray device and into the other nostril. Patient not taking: Reported on 05/27/2023    [provider]  nitroGLYCERIN (NITROSTAT) 0.4 MG SL tablet Place 1 tablet (0.4 mg total) under the tongue every 5 (five) minutes as needed for chest pain. Patient not taking: Reported on 06/25/2023 01/29/22   Corrin Parker, PA-C  Omega-3 Fatty Acids (FISH OIL) 1000 MG CAPS Take 1,000 mg by mouth 2 (two) times daily.    [provider]  simethicone (MYLICON) 40 MG/0.6ML drops Take 0.6 mLs (40 mg total) by mouth every 6 (six) hours as needed for flatulence. 06/11/23   [name], Delene Ruffini, MD  tadalafil (CIALIS) 10 MG tablet Take 1 tablet (10 mg total) by mouth daily as needed for erectile dysfunction. 10/21/22   Sharlene Dory, PA-C  thiamine (VITAMIN B-1) 100 MG tablet Take 1 tablet (100 mg total) by mouth daily. 05/16/23   Rodolph Bong, MD    Social History   Socioeconomic History   Marital status: Married    Spouse name: Not on file   Number of children: Not on file   Years of education: Not on file   Highest education level: Not on file  Occupational History   Not on file  Tobacco Use   Smoking status: Former    Current packs/day: 0.00    Types: Cigarettes    Quit date: 11/25/2007    Years since quitting: 15.5   Smokeless tobacco: Never  Vaping Use   Vaping status: Never Used  Substance and Sexual Activity   Alcohol use: Yes    Comment: Used to drink heavily - says currently 2 beers/day.   Drug use: Yes    Types: Marijuana    Comment: reports not  using cocaine   Sexual activity: Yes    Birth control/protection: None  Other Topics Concern   Not on file  Social History Narrative   Lives in [place] by himself.  Does not work or routinely exercise.   Social Determinants of Health   Financial Resource Strain: Not on file  Food Insecurity: No Food Insecurity (05/28/2023)   Hunger Vital Sign    Worried About Running Out of Food in the Last Year: Never true    Ran Out of Food in the Last Year: Never true  Transportation Needs: No Transportation Needs (05/28/2023)   PRAPARE - Administrator, Civil Service (Medical): No    Lack of Transportation (Non-Medical): No  Physical Activity: Not on file  Stress: Not on file  Social Connections: Unknown (03/05/2023)   Received from [facility]   Social Network    Social Network: Not on file  Intimate Partner Violence: Not At Risk (05/28/2023)   Humiliation, Afraid, Rape, and Kick questionnaire    Fear of Current or Ex-Partner: No    Emotionally Abused: No    Physically Abused: No    Sexually Abused: No     Family History  Problem Relation Age of Onset   Heart disease Mother        MI 55s    ROS: [x]  Positive   [ ]  Negative   [ ]  All sytems reviewed and are negative  Cardiovascular: []  chest pain/pressure []  palpitations []  SOB lying flat []  DOE []  pain in legs while walking []  pain in legs at rest []  pain in legs at night []  non-healing ulcers []  hx of DVT []  swelling in legs  Pulmonary: []  productive cough []  asthma/wheezing []  home O2  Neurologic: []  weakness in []  arms []  legs []  numbness in []  arms []  legs []  hx of CVA []  mini stroke [] difficulty speaking or slurred speech []  temporary loss of vision in one eye []  dizziness  Hematologic: []  hx of cancer []  bleeding problems []  problems with blood clotting easily  Endocrine:   []  diabetes []  thyroid disease  GI []  vomiting blood []  blood in stool  GU: []  CKD/renal failure []   HD--[]  M/W/F or []  T/T/S []  burning with urination []  blood in urine  Psychiatric: []  anxiety []  depression  Musculoskeletal: []  arthritis []  joint pain  Integumentary: []  rashes []  ulcers  Constitutional: []  fever []  chills   Physical Examination  Vitals:   06/25/23 2227 06/26/23 0000  BP: 126/63 115/67  Pulse: 80 83  Resp: 14 18  Temp: 99.1 F (37.3 C)   SpO2: 98% 97%   Body mass index is 20.22 kg/m.  General:  NAD Gait: Not observed HENT: WNL, normocephalic Pulmonary: normal non-labored breathing Cardiac: regular, without  Murmurs, rubs or gallops Abdomen:  soft, NT/ND Vascular Exam/Pulses: Right femoral pulse palpable Right BKA nonhealing as pictured  Neurologic: A&O X 3; Appropriate Affect ; SENSATION: normal; MOTOR FUNCTION:  moving all extremities equally. Speech is fluent/normal     CBC    Component Value Date/Time   WBC 9.3 06/25/2023 2001   RBC 2.88 (L) 06/25/2023 2001   HGB 7.8 (L) 06/25/2023 2001   HCT 22.3 (L) 06/25/2023 2001   PLT 196 06/25/2023 2001   MCV 77.4 (L) 06/25/2023 2001   MCH 27.1 06/25/2023 2001   MCHC 35.0 06/25/2023 2001   RDW 14.1 06/25/2023 2001   LYMPHSABS 1.9 06/25/2023 2001   MONOABS 0.9 06/25/2023 2001   EOSABS 0.2 06/25/2023 2001   BASOSABS 0.0 06/25/2023 2001    BMET    Component Value Date/Time   NA 128 (L) 06/25/2023 1758   NA 131 (L) 12/14/2020 5784  K 4.2 06/25/2023 1758   CL 95 (L) 06/25/2023 1758   CO2 17 (L) 06/25/2023 1758   GLUCOSE 157 (H) 06/25/2023 1758   BUN 29 (H) 06/25/2023 1758   BUN 26 12/14/2020 0849   CREATININE 1.60 (H) 06/25/2023 1758   CALCIUM 8.8 (L) 06/25/2023 1758   GFRNONAA 48 (L) 06/25/2023 1758   GFRAA 53 (L) 12/14/2020 0849    COAGS: Lab Results  Component Value Date   INR 1.1 06/25/2023   INR 1.1 01/27/2022   INR 1.04 08/29/2017     Non-Invasive Vascular Imaging:    N/A   ASSESSMENT/PLAN: This is a 62 y.o. male that vascular surgery has been consulted for  infection of the right below-knee amputation.  Patient previously underwent shockwave lithotripsy with DCB of the right popliteal artery on 04/27/2023 by Dr. Randie Heinz.  He then had a right TMA on 05/12/2023.  This was nonhealing and he was converted to a BKA on 05/30/23.  Seen in the office today and recommended admission for IV antibiotics.  I had a discussion in the ED with the patient about his nonhealing BKA.  I discussed he has several options which includes surgical revision and debridement of the BKA with VAC placement in the OR and possible conversion to an above-knee amputation.  He states he had enough surgery and wants time for antibiotics to work.  I am doubtful that this will be effective as I discussed with him.  Vascular surgery will follow.  Would continue broad spectrum antibiotics for now.    Cephus Shelling, MD [facility] Office: [phone]  Cephus Shelling

## 2023-06-26 NOTE — Progress Notes (Signed)
[facility] PROGRESS NOTE   Jose [name] ZOX:[id number] DOB: 10-08-1961 DOA: 06/25/2023  PCP: [name]  Brief History/Interval Summary: 62 year old African-American male with past medical history of peripheral disease status post right BKA on July 6 thousand 24, history of type 2 diabetes, hypertension, CKD stage IIIb, coronary disease, hyperlipidemia presented to the ER from the vascular surgery office.  Patient states that he has noticed increasing pain and drainage from the right BKA stump.  Patient seen and vascular surgery clinic.  He had evidence of infection at the stump.  Patient sent to the hospital for IV antibiotics and admission.     Consultants: Vascular surgery  Procedures: None yet    Subjective/Interval History: Patient complains of pain at his stump site.  Also complains of constipation.  Has not had a bowel movement in 1 week.  Denies any abdominal pain nausea or vomiting.    Assessment/Plan:  Infection of the right BKA stump Followed by vascular surgery.  Seen by vascular surgery here in the hospital.  Surgery was offered but patient wants to hold off for now.  Continue antibiotics.  Noted to be on vancomycin and Zosyn. Follow-up on cultures. Pain control.  Constipation This is likely secondary to pain medications.  Will initiate aggressive bowel regimen.  Enemas as needed.  Check TSH.  Chronic kidney disease stage IIIb hyponatremia Creatinine seems to be between 1.5-2.0.  Seems to be close to baseline.  Monitor urine output.  Avoid nephrotoxic agents. There is some chronicity to his hyponatremia.  Levels are stable.  Microcytic anemia Hemoglobin seems to be between 10-12.  Significant drop in hemoglobin over the past month or so.  Probably due to his infection and recent surgery chronic disease.  Will check anemia panel.   Diabetes mellitus type 2, uncontrolled with hypoglycemia Noted to be hypoglycemic this morning.  Will hold  his long-acting insulin.  Given D50.  Will check CBGs later today. Noted to be on sodium bicarbonate.  Status post BKA in July of this year See above.  History of BPH Stable.  History of coronary artery disease Stable.  Continue aspirin.  Dyslipidemia Continue with statin.  Essential hypertension Continue with home medications.  Noted to be on amlodipine carvedilol.  DVT Prophylaxis: Subcutaneous heparin Code Status: Full code Family Communication: Discussed with patient Disposition Plan: To be determined  Status is: Inpatient Remains inpatient appropriate because: Infection involving the right BKA stump requiring IV antibiotics    Medications: Scheduled:  amitriptyline  25 mg Oral QHS   amLODipine  10 mg Oral Daily   aspirin EC  81 mg Oral Daily   atorvastatin  80 mg Oral QPM   carvedilol  18.75 mg Oral BID WC   clopidogrel  75 mg Oral Q breakfast   famotidine  20 mg Oral Daily   gabapentin  100 mg Oral TID   heparin  5,000 Units Subcutaneous Q8H    HYDROmorphone (DILAUDID) injection  0.5 mg Intramuscular Once   insulin aspart  0-9 Units Subcutaneous TID WC   mirtazapine  15 mg Oral QHS   sodium bicarbonate  650 mg Oral BID   Continuous:  piperacillin-tazobactam (ZOSYN)  IV Stopped (06/26/23 8119)   vancomycin     JYN:[id number] **OR** acetaminophen, hydrOXYzine, ipratropium-albuterol, ondansetron **OR** ondansetron (ZOFRAN) IV, oxyCODONE-acetaminophen  Antibiotics: Anti-infectives (From admission, onward)    Start     Dose/Rate Route Frequency Ordered Stop   06/26/23 1800  vancomycin (VANCOCIN) IVPB 1000 mg/200 mL premix  1,000 mg 200 mL/hr over 60 Minutes Intravenous Every 24 hours 06/25/23 2223     06/26/23 0600  piperacillin-tazobactam (ZOSYN) IVPB 3.375 g        3.375 g 12.5 mL/hr over 240 Minutes Intravenous Every 8 hours 06/25/23 2223     06/25/23 1700  ceFEPIme (MAXIPIME) 2 g in sodium chloride 0.9 % 100 mL IVPB        2 g 200 mL/hr over  30 Minutes Intravenous  Once 06/25/23 1654 06/25/23 1944   06/25/23 1700  metroNIDAZOLE (FLAGYL) IVPB 500 mg        500 mg 100 mL/hr over 60 Minutes Intravenous  Once 06/25/23 1654 06/25/23 1923   06/25/23 1700  vancomycin (VANCOCIN) IVPB 1000 mg/200 mL premix  Status:  Discontinued        1,000 mg 200 mL/hr over 60 Minutes Intravenous  Once 06/25/23 1654 06/25/23 1656   06/25/23 1700  vancomycin (VANCOREADY) IVPB 1250 mg/250 mL        1,250 mg 166.7 mL/hr over 90 Minutes Intravenous  Once 06/25/23 1656 06/25/23 1952       Objective:  Vital Signs  Vitals:   06/26/23 0601 06/26/23 0630 06/26/23 0809 06/26/23 0810  BP:  111/73 124/71 127/71  Pulse: 66 72  87  Resp: 17 17    Temp: 98.8 F (37.1 C) 98.8 F (37.1 C)    TempSrc: Oral Oral    SpO2: 100% 100%    Weight:      Height:        Intake/Output Summary (Last 24 hours) at 06/26/2023 0926 Last data filed at 06/26/2023 0754 Gross per 24 hour  Intake --  Output 275 ml  Net -275 ml   Filed Weights   06/25/23 2222  Weight: 65.8 kg    General appearance: Awake alert.  In no distress Resp: Clear to auscultation bilaterally.  Normal effort Cardio: S1-S2 is normal regular.  No S3-S4.  No rubs murmurs or bruit GI: Abdomen is soft.  Nontender nondistended.  Bowel sounds are present normal.  No masses organomegaly Extremities: Right BKA stump is covered in dressing Neurologic: Alert and oriented x3.  No focal neurological deficits.    Lab Results:  Data Reviewed: I have personally reviewed following labs and reports of the imaging studies  CBC: Recent Labs  Lab 06/25/23 2001 06/26/23 0344  WBC 9.3 9.0  NEUTROABS 6.3 6.0  HGB 7.8* 7.7*  HCT 22.3* 22.4*  MCV 77.4* 75.4*  PLT 196 195    Basic Metabolic Panel: Recent Labs  Lab 06/25/23 1758 06/26/23 0344  NA 128* 131*  K 4.2 3.6  CL 95* 97*  CO2 17* 19*  GLUCOSE 157* 63*  BUN 29* 25*  CREATININE 1.60* 1.45*  CALCIUM 8.8* 8.6*  MG  --  1.2*     GFR: Estimated Creatinine Clearance: 47.9 mL/min (A) (by C-G formula based on SCr of 1.45 mg/dL (H)).  Liver Function Tests: Recent Labs  Lab 06/25/23 1758 06/26/23 0344  AST 24 18  ALT 25 24  ALKPHOS 76 67  BILITOT 0.7 0.5  PROT 6.5 5.3*  ALBUMIN 2.9* 2.3*     Coagulation Profile: Recent Labs  Lab 06/25/23 1757  INR 1.1     CBG: Recent Labs  Lab 06/26/23 0648 06/26/23 0756  GLUCAP 52* 142*     Recent Results (from the past 240 hour(s))  Blood Culture (routine x 2)     Status: None (Preliminary result)   Collection Time: 06/25/23  5:44 PM   Specimen: BLOOD LEFT WRIST  Result Value Ref Range Status   Specimen Description BLOOD LEFT WRIST  Final   Special Requests   Final    BOTTLES DRAWN AEROBIC ONLY Blood Culture results may not be optimal due to an inadequate volume of blood received in culture bottles   Culture   Final    NO GROWTH < 12 HOURS Performed at [facility], [address], [place], [place] [address]    Report Status PENDING  Incomplete  Blood Culture (routine x 2)     Status: None (Preliminary result)   Collection Time: 06/25/23  5:58 PM   Specimen: BLOOD RIGHT HAND  Result Value Ref Range Status   Specimen Description BLOOD RIGHT HAND  Final   Special Requests   Final    BOTTLES DRAWN AEROBIC ONLY Blood Culture adequate volume   Culture   Final    NO GROWTH < 12 HOURS Performed at [facility], [address] [place], [place] [address]    Report Status PENDING  Incomplete      Radiology Studies: DG Knee 2 Views Right  Result Date: 06/25/2023 CLINICAL DATA:  Infection EXAM: RIGHT KNEE - 1-2 VIEW COMPARISON:  None Available. FINDINGS: Patient is status post below the knee amputation, partially visualized intramedullary rod within the tibia. Extensive vascular calcifications. Cut margins appear smooth. Soft tissue swelling at the distal stump without soft tissue emphysema. No osseous destructive change IMPRESSION: Status  post below the knee amputation. Soft tissue swelling at the distal stump without soft tissue emphysema or acute osseous abnormality. Electronically Signed   By: Jasmine Pang M.D.   On: 06/25/2023 17:30       LOS: 1 day      [facility] Pager on [email or web address]  06/26/2023, 9:26 AM

## 2023-06-26 NOTE — Plan of Care (Signed)

## 2023-06-26 NOTE — Consult Note (Signed)
WOC Nurse Consult Note: Reason for Consult: amputation site Seen in [facility] office yesterday. Patient is a TMA from 05/12/23 and BKA on 05/30/23 with know surgical site infection, skin necrosis along the staple line. [facility] has recommend AKA/revision  Pt is currently not receptive to surgical intervention Wound type: surgical in the presence of PAD Pressure Injury POA: NA Measurement: see nursing flow sheet, actual open area of the staple line is small at the lateral aspect of the incision  Wound bed:95% black/skin necrosis with 5% pink area of full thickness skin loss Drainage (amount, consistency, odor) serosanguinous in images  Periwound: intact  He is noted to have a small open wound that is healing on the right knee, 75% re-epithelized skin 25% pink, moist  Dressing procedure/placement/frequency: Silicone foam to the right knee wound change every 3 days  Paint the surgical/amputation site with betadine daily, dry dressing. Change daily   Re consult if needed, will not follow at this time. Thanks   [employer], RN,CWOCN, CNS, CWON-AP [phone])

## 2023-06-26 NOTE — ED Notes (Signed)
ED TO INPATIENT HANDOFF REPORT  ED Nurse Name and Phone #: [phone]   S Name/Age/Gender Jose [name] Jose [name] 62 y.o. male Room/Bed: 041C/041C  Code Status   Code Status: Full Code  Home/SNF/Other Rehab Patient oriented to: self, place, time, and situation Is this baseline? Yes   Triage Complete: Triage complete  Chief Complaint Infection of right below knee amputation [facility]) [T87.43]  Triage Note Patient BIB [facility] from vascular office for wound infection after recent amputation. Patient A&Ox4, 7/10 pain to right leg. HR 86 BP 140/78, EMS reports patient did not feel febrile.   Allergies Allergies  Allergen Reactions   Lisinopril-Hydrochlorothiazide Swelling and Other (See Comments)    Causes swelling of lips   Simvastatin Other (See Comments)    ALT (SGPT) level raised, Aspartate aminotransferase serum level raised    Level of Care/Admitting Diagnosis ED Disposition     ED Disposition  Admit   Condition  --   Comment  Hospital Area: [facility] [[id number]]  Level of Care: Med-Surg [[id number]]  May admit patient to [facility] or [facility] if equivalent level of care is available:: No  Covid Evaluation: Asymptomatic - no recent exposure (last 10 days) testing not required  Diagnosis: Infection of right below knee amputation [facility]) [[id number]]  Admitting Physician: Imogene Burn ERIC [[id number]]  Attending Physician: Imogene Burn, ERIC [[id number]]  Certification:: I certify this patient will need inpatient services for at least 2 midnights  Estimated Length of Stay: 4          B Medical/Surgery History Past Medical History:  Diagnosis Date   Alcohol abuse    CAD (coronary artery disease)    a. Reported MI in 2012 s/p 2 stents;  b. 08/2012 Cath: LM 20, LAD 20 diff ISR, jailed septal - 99%, LCX 30ost, RI large, min irregs, RCA 30p, 20-30 ISR-->Med Rx; c. 2017 [facility]   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   Gangrene of right foot (HCC) 04/24/2023   Hyperlipidemia    Hypertension    Hypertensive urgency 02/11/2021   Metabolic acidosis 05/11/2023   Noncompliance    Past Surgical History:  Procedure Laterality Date   ABDOMINAL AORTOGRAM W/LOWER EXTREMITY N/A 04/27/2023   Procedure: ABDOMINAL AORTOGRAM W/LOWER EXTREMITY;  Surgeon: Maeola Harman, MD;  Location: [facility] INVASIVE CV LAB;  Service: Cardiovascular;  Laterality: N/A;   AMPUTATION Right 05/30/2023   Procedure: AMPUTATION BELOW KNEE;  Surgeon: Maeola Harman, MD;  Location: [facility];  Service: Vascular;  Laterality: Right;   CARDIAC CATHETERIZATION     CORONARY STENT PLACEMENT     FRACTURE SURGERY Right    hardware from right leg fracture   LEFT HEART CATH AND CORONARY ANGIOGRAPHY N/A 08/22/2020   Procedure: LEFT HEART CATH AND CORONARY ANGIOGRAPHY;  Surgeon: Lyn Records, MD;  Location: [facility] INVASIVE CV LAB;  Service: Cardiovascular;  Laterality: N/A;   LEFT HEART CATH AND CORONARY ANGIOGRAPHY N/A 01/28/2022   Procedure: LEFT HEART CATH AND CORONARY ANGIOGRAPHY;  Surgeon: Lennette Bihari, MD;  Location: [facility] INVASIVE CV LAB;  Service: Cardiovascular;  Laterality: N/A;   LEFT HEART CATHETERIZATION WITH CORONARY ANGIOGRAM Bilateral 08/25/2012   Procedure: LEFT HEART CATHETERIZATION WITH CORONARY ANGIOGRAM;  Surgeon: Kathleene Hazel, MD;  Location:  [facility] CATH LAB;  Service: Cardiovascular;  Laterality: Bilateral;   PERIPHERAL INTRAVASCULAR LITHOTRIPSY  04/27/2023   Procedure: PERIPHERAL INTRAVASCULAR LITHOTRIPSY;  Surgeon: Maeola Harman, MD;  Location: [facility] INVASIVE CV LAB;  Service: Cardiovascular;;   PERIPHERAL VASCULAR BALLOON ANGIOPLASTY  04/27/2023   Procedure: PERIPHERAL VASCULAR BALLOON ANGIOPLASTY;  Surgeon: Maeola Harman, MD;   Location: [facility] INVASIVE CV LAB;  Service: Cardiovascular;;   TRANSMETATARSAL AMPUTATION Right 05/12/2023   Procedure: TRANSMETATARSAL AMPUTATION;  Surgeon: Chuck Hint, MD;  Location: [facility] OR;  Service: Vascular;  Laterality: Right;     A IV Location/Drains/Wounds Patient Lines/Drains/Airways Status     Active Line/Drains/Airways     Name Placement date Placement time Site Days   Peripheral IV 06/25/23 22 G Anterior;Distal;Left Forearm 06/25/23  1744  Forearm  1   Peripheral IV 06/25/23 20 G 1.88" Anterior;Left;Upper Arm 06/25/23  1806  Arm  1            Intake/Output Last 24 hours  Intake/Output Summary (Last 24 hours) at 06/26/2023 1102 Last data filed at 06/26/2023 0754 Gross per 24 hour  Intake --  Output 275 ml  Net -275 ml    Labs/Imaging Results for orders placed or performed during the hospital encounter of 06/25/23 (from the past 48 hour(s))  Blood Culture (routine x 2)     Status: None (Preliminary result)   Collection Time: 06/25/23  5:44 PM   Specimen: BLOOD LEFT WRIST  Result Value Ref Range   Specimen Description BLOOD LEFT WRIST    Special Requests      BOTTLES DRAWN AEROBIC ONLY Blood Culture results may not be optimal due to an inadequate volume of blood received in culture bottles   Culture      NO GROWTH < 12 HOURS Performed at [facility] Lab, [address] [place], [place] [address]    Report Status PENDING   I-Stat Lactic Acid, ED     Status: Abnormal   Collection Time: 06/25/23  5:46 PM  Result Value Ref Range   Lactic Acid, Venous 2.0 (HH) 0.5 - 1.9 mmol/L   Comment NOTIFIED PHYSICIAN   Protime-INR     Status: None   Collection Time: 06/25/23  5:57 PM  Result Value Ref Range   Prothrombin Time 14.6 11.4 - 15.2 seconds   INR 1.1 0.8 - 1.2    Comment: (NOTE) INR goal varies based on device and disease states. Performed at [facility] Lab, [address], [place], [place] [address]   APTT     Status: Abnormal   Collection  Time: 06/25/23  5:57 PM  Result Value Ref Range   aPTT 38 (H) 24 - 36 seconds    Comment:        IF BASELINE aPTT IS ELEVATED, SUGGEST PATIENT RISK ASSESSMENT BE USED TO DETERMINE APPROPRIATE ANTICOAGULANT THERAPY. Performed at [facility], [address], [place], [place] [address]   Comprehensive metabolic panel     Status: Abnormal   Collection Time: 06/25/23  5:58 PM  Result Value Ref Range   Sodium 128 (L) 135 - 145 mmol/L   Potassium 4.2 3.5 - 5.1 mmol/L   Chloride 95 (L) 98 - 111 mmol/L   CO2 17 (L) 22 - 32 mmol/L   Glucose, Bld 157 (H) 70 - 99 mg/dL    Comment: Glucose reference range applies only to samples taken after fasting for at least 8 hours.   BUN 29 (H) 8 - 23 mg/dL  Creatinine, Ser 1.60 (H) 0.61 - 1.24 mg/dL   Calcium 8.8 (L) 8.9 - 10.3 mg/dL   Total Protein 6.5 6.5 - 8.1 g/dL   Albumin 2.9 (L) 3.5 - 5.0 g/dL   AST 24 15 - 41 U/L   ALT 25 0 - 44 U/L   Alkaline Phosphatase 76 38 - 126 U/L   Total Bilirubin 0.7 0.3 - 1.2 mg/dL   GFR, Estimated 48 (L) >60 mL/min    Comment: (NOTE) Calculated using the CKD-EPI Creatinine Equation (2021)    Anion gap 16 (H) 5 - 15    Comment: Performed at [facility], [address] [place], [place] [address]  Blood Culture (routine x 2)     Status: None (Preliminary result)   Collection Time: 06/25/23  5:58 PM   Specimen: BLOOD RIGHT HAND  Result Value Ref Range   Specimen Description BLOOD RIGHT HAND    Special Requests      BOTTLES DRAWN AEROBIC ONLY Blood Culture adequate volume   Culture      NO GROWTH < 12 HOURS Performed at [facility], [address], [place], [place] [address]    Report Status PENDING   CBC with Differential/Platelet     Status: Abnormal   Collection Time: 06/25/23  8:01 PM  Result Value Ref Range   WBC 9.3 4.0 - 10.5 K/uL   RBC 2.88 (L) 4.22 - 5.81 MIL/uL   Hemoglobin 7.8 (L) 13.0 - 17.0 g/dL    Comment: Reticulocyte Hemoglobin testing may be clinically  indicated, consider ordering this additional test [id number]    HCT 22.3 (L) 39.0 - 52.0 %   MCV 77.4 (L) 80.0 - 100.0 fL   MCH 27.1 26.0 - 34.0 pg   MCHC 35.0 30.0 - 36.0 g/dL   RDW 56.2 13.0 - 86.5 %   Platelets 196 150 - 400 K/uL   nRBC 0.0 0.0 - 0.2 %   Neutrophils Relative % 68 %   Neutro Abs 6.3 1.7 - 7.7 K/uL   Lymphocytes Relative 21 %   Lymphs Abs 1.9 0.7 - 4.0 K/uL   Monocytes Relative 9 %   Monocytes Absolute 0.9 0.1 - 1.0 K/uL   Eosinophils Relative 2 %   Eosinophils Absolute 0.2 0.0 - 0.5 K/uL   Basophils Relative 0 %   Basophils Absolute 0.0 0.0 - 0.1 K/uL   Immature Granulocytes 0 %   Abs Immature Granulocytes 0.03 0.00 - 0.07 K/uL    Comment: Performed at [facility] Lab, [address] [place], [place] [address]  I-Stat Lactic Acid, ED     Status: None   Collection Time: 06/25/23  8:17 PM  Result Value Ref Range   Lactic Acid, Venous 1.1 0.5 - 1.9 mmol/L  Urinalysis, w/ Reflex to Culture (Infection Suspected) -Urine, Clean Catch     Status: Abnormal   Collection Time: 06/25/23  9:37 PM  Result Value Ref Range   Specimen Source URINE, CLEAN CATCH    Color, Urine YELLOW YELLOW   APPearance CLOUDY (A) CLEAR   Specific Gravity, Urine 1.020 1.005 - 1.030   pH 5.0 5.0 - 8.0   Glucose, UA NEGATIVE NEGATIVE mg/dL   Hgb urine dipstick NEGATIVE NEGATIVE   Bilirubin Urine NEGATIVE NEGATIVE   Ketones, ur NEGATIVE NEGATIVE mg/dL   Protein, ur 629 (A) NEGATIVE mg/dL   Nitrite NEGATIVE NEGATIVE   Leukocytes,Ua NEGATIVE NEGATIVE   RBC / HPF 0-5 0 - 5 RBC/hpf   WBC,  UA 0-5 0 - 5 WBC/hpf    Comment:        Reflex urine culture not performed if WBC <=10, OR if Squamous epithelial cells >5. If Squamous epithelial cells >5 suggest recollection.    Bacteria, UA RARE (A) NONE SEEN   Squamous Epithelial / HPF 0-5 0 - 5 /HPF   Mucus PRESENT    Amorphous Crystal PRESENT     Comment: Performed at [facility] Lab, [address] [place], [place] [address]  Magnesium      Status: Abnormal   Collection Time: 06/26/23  3:44 AM  Result Value Ref Range   Magnesium 1.2 (L) 1.7 - 2.4 mg/dL    Comment: Performed at [facility], [address], [place], [place] [address]  CBC with Differential/Platelet     Status: Abnormal   Collection Time: 06/26/23  3:44 AM  Result Value Ref Range   WBC 9.0 4.0 - 10.5 K/uL   RBC 2.97 (L) 4.22 - 5.81 MIL/uL   Hemoglobin 7.7 (L) 13.0 - 17.0 g/dL    Comment: Reticulocyte Hemoglobin testing may be clinically indicated, consider ordering this additional test [id number]    HCT 22.4 (L) 39.0 - 52.0 %   MCV 75.4 (L) 80.0 - 100.0 fL   MCH 25.9 (L) 26.0 - 34.0 pg   MCHC 34.4 30.0 - 36.0 g/dL   RDW 69.6 29.5 - 28.4 %   Platelets 195 150 - 400 K/uL   nRBC 0.0 0.0 - 0.2 %   Neutrophils Relative % 66 %   Neutro Abs 6.0 1.7 - 7.7 K/uL   Lymphocytes Relative 21 %   Lymphs Abs 1.8 0.7 - 4.0 K/uL   Monocytes Relative 10 %   Monocytes Absolute 0.9 0.1 - 1.0 K/uL   Eosinophils Relative 2 %   Eosinophils Absolute 0.2 0.0 - 0.5 K/uL   Basophils Relative 0 %   Basophils Absolute 0.0 0.0 - 0.1 K/uL   Immature Granulocytes 1 %   Abs Immature Granulocytes 0.05 0.00 - 0.07 K/uL    Comment: Performed at [facility] Lab, [address], [place], [place] [address]  Procalcitonin     Status: None   Collection Time: 06/26/23  3:44 AM  Result Value Ref Range   Procalcitonin 0.92 ng/mL    Comment:        Interpretation: PCT > 0.5 ng/mL and <= 2 ng/mL: Systemic infection (sepsis) is possible, but other conditions are known to elevate PCT as well. (NOTE)       Sepsis PCT Algorithm           Lower Respiratory Tract                                      Infection PCT Algorithm    ----------------------------     ----------------------------         PCT < 0.25 ng/mL                PCT < 0.10 ng/mL          Strongly encourage             Strongly discourage   discontinuation of antibiotics    initiation of antibiotics     ----------------------------     -----------------------------       PCT 0.25 - 0.50 ng/mL  PCT 0.10 - 0.25 ng/mL               OR       >80% decrease in PCT            Discourage initiation of                                            antibiotics      Encourage discontinuation           of antibiotics    ----------------------------     -----------------------------         PCT >= 0.50 ng/mL              PCT 0.26 - 0.50 ng/mL                AND       <80% decrease in PCT             Encourage initiation of                                             antibiotics       Encourage continuation           of antibiotics    ----------------------------     -----------------------------        PCT >= 0.50 ng/mL                  PCT > 0.50 ng/mL               AND         increase in PCT                  Strongly encourage                                      initiation of antibiotics    Strongly encourage escalation           of antibiotics                                     -----------------------------                                           PCT <= 0.25 ng/mL                                                 OR                                        > 80% decrease in PCT  Discontinue / Do not initiate                                             antibiotics  Performed at [facility] Lab, [address] [place], [place] [address]   Comprehensive metabolic panel     Status: Abnormal   Collection Time: 06/26/23  3:44 AM  Result Value Ref Range   Sodium 131 (L) 135 - 145 mmol/L   Potassium 3.6 3.5 - 5.1 mmol/L   Chloride 97 (L) 98 - 111 mmol/L   CO2 19 (L) 22 - 32 mmol/L   Glucose, Bld 63 (L) 70 - 99 mg/dL    Comment: Glucose reference range applies only to samples taken after fasting for at least 8 hours.   BUN 25 (H) 8 - 23 mg/dL   Creatinine, Ser 9.56 (H) 0.61 - 1.24 mg/dL   Calcium 8.6 (L) 8.9 - 10.3 mg/dL   Total Protein 5.3  (L) 6.5 - 8.1 g/dL   Albumin 2.3 (L) 3.5 - 5.0 g/dL   AST 18 15 - 41 U/L   ALT 24 0 - 44 U/L   Alkaline Phosphatase 67 38 - 126 U/L   Total Bilirubin 0.5 0.3 - 1.2 mg/dL   GFR, Estimated 54 (L) >60 mL/min    Comment: (NOTE) Calculated using the CKD-EPI Creatinine Equation (2021)    Anion gap 15 5 - 15    Comment: Performed at [facility] Lab, [address] [place], [place] [address]  CBG monitoring, ED     Status: Abnormal   Collection Time: 06/26/23  6:48 AM  Result Value Ref Range   Glucose-Capillary 52 (L) 70 - 99 mg/dL    Comment: Glucose reference range applies only to samples taken after fasting for at least 8 hours.  CBG monitoring, ED     Status: Abnormal   Collection Time: 06/26/23  7:56 AM  Result Value Ref Range   Glucose-Capillary 142 (H) 70 - 99 mg/dL    Comment: Glucose reference range applies only to samples taken after fasting for at least 8 hours.   Comment 1 Notify RN    Comment 2 Document in Chart    DG Knee 2 Views Right  Result Date: 06/25/2023 CLINICAL DATA:  Infection EXAM: RIGHT KNEE - 1-2 VIEW COMPARISON:  None Available. FINDINGS: Patient is status post below the knee amputation, partially visualized intramedullary rod within the tibia. Extensive vascular calcifications. Cut margins appear smooth. Soft tissue swelling at the distal stump without soft tissue emphysema. No osseous destructive change IMPRESSION: Status post below the knee amputation. Soft tissue swelling at the distal stump without soft tissue emphysema or acute osseous abnormality. Electronically Signed   By: Jasmine Pang M.D.   On: 06/25/2023 17:30    Pending Labs Unresulted Labs (From admission, onward)     Start     Ordered   06/27/23 0500  Vitamin B12  (Anemia Panel (PNL))  Tomorrow morning,   R        06/26/23 0935   06/27/23 0500  Folate  (Anemia Panel (PNL))  Tomorrow morning,   R        06/26/23 0935   06/27/23 0500  Iron and TIBC  (Anemia Panel (PNL))  Tomorrow morning,   R         06/26/23 0935   06/27/23 0500  Ferritin  (Anemia Panel (PNL))  Tomorrow morning,   R        06/26/23 0935   06/27/23 0500  Reticulocytes  (Anemia Panel (PNL))  Tomorrow morning,   R        06/26/23 0935   06/27/23 0500  TSH  Tomorrow morning,   R        06/26/23 0935   06/27/23 0500  CBC  Daily,   R      06/26/23 0935   06/27/23 0500  Basic metabolic panel  Daily,   R      06/26/23 0935   06/27/23 0500  Magnesium  Tomorrow morning,   R        06/26/23 0935   06/25/23 1652  CBC with Differential  (Septic presentation on arrival (screening labs, nursing and treatment orders for obvious sepsis))  ONCE - STAT,   STAT        06/25/23 1654            Vitals/Pain Today's Vitals   06/26/23 0630 06/26/23 0809 06/26/23 0810 06/26/23 1035  BP: 111/73 124/71 127/71 111/61  Pulse: 72  87 69  Resp: 17   17  Temp: 98.8 F (37.1 C)   98.6 F (37 C)  TempSrc: Oral   Oral  SpO2: 100%   100%  Weight:      Height:      PainSc:        Isolation Precautions No active isolations  Medications Medications  aspirin EC tablet 81 mg (81 mg Oral Given 06/26/23 0808)  amLODipine (NORVASC) tablet 10 mg (10 mg Oral Given 06/26/23 0809)  atorvastatin (LIPITOR) tablet 80 mg (has no administration in time range)  carvedilol (COREG) tablet 18.75 mg (18.75 mg Oral Given 06/26/23 0810)  amitriptyline (ELAVIL) tablet 25 mg (25 mg Oral Given 06/26/23 0046)  hydrOXYzine (ATARAX) tablet 25 mg (has no administration in time range)  mirtazapine (REMERON) tablet 15 mg (15 mg Oral Given 06/26/23 0045)  famotidine (PEPCID) tablet 20 mg (20 mg Oral Given 06/26/23 0811)  sodium bicarbonate tablet 650 mg (650 mg Oral Given 06/26/23 0808)  clopidogrel (PLAVIX) tablet 75 mg (75 mg Oral Given 06/26/23 0811)  gabapentin (NEURONTIN) capsule 100 mg (100 mg Oral Given 06/26/23 0810)  ipratropium-albuterol (DUONEB) 0.5-2.5 (3) MG/3ML nebulizer solution 3 mL (has no administration in time range)  heparin injection 5,000 Units  (5,000 Units Subcutaneous Given 06/26/23 0513)  acetaminophen (TYLENOL) tablet 650 mg (has no administration in time range)    Or  acetaminophen (TYLENOL) suppository 650 mg (has no administration in time range)  ondansetron (ZOFRAN) tablet 4 mg (has no administration in time range)    Or  ondansetron (ZOFRAN) injection 4 mg (has no administration in time range)  piperacillin-tazobactam (ZOSYN) IVPB 3.375 g (0 g Intravenous Stopped 06/26/23 0812)  vancomycin (VANCOCIN) IVPB 1000 mg/200 mL premix (has no administration in time range)  insulin aspart (novoLOG) injection 0-9 Units ( Subcutaneous Not Given 06/26/23 0812)  HYDROmorphone (DILAUDID) injection 0.5 mg (has no administration in time range)  oxyCODONE-acetaminophen (PERCOCET/ROXICET) 5-325 MG per tablet 1 tablet (has no administration in time range)  polyethylene glycol (MIRALAX / GLYCOLAX) packet 17 g (has no administration in time range)  senna-docusate (Senokot-S) tablet 2 tablet (has no administration in time range)  sodium phosphate (FLEET) 7-19 GM/118ML enema 1 enema (has no administration in time range)  lactated ringers bolus 1,000 mL (0 mLs Intravenous Stopped 06/25/23 1953)  ceFEPIme (MAXIPIME) 2 g in  sodium chloride 0.9 % 100 mL IVPB (0 g Intravenous Stopped 06/25/23 1944)  metroNIDAZOLE (FLAGYL) IVPB 500 mg (0 mg Intravenous Stopped 06/25/23 1923)  vancomycin (VANCOREADY) IVPB 1250 mg/250 mL (0 mg Intravenous Stopped 06/25/23 1952)  HYDROmorphone (DILAUDID) injection 0.5 mg (0.5 mg Intravenous Given 06/25/23 2018)  lactated ringers bolus 1,000 mL (0 mLs Intravenous Stopped 06/26/23 0509)  HYDROmorphone (DILAUDID) injection 0.5 mg (0.5 mg Intravenous Given 06/25/23 2308)  HYDROmorphone (DILAUDID) 1 MG/ML injection (1 mg  Given 06/25/23 2307)  methocarbamol (ROBAXIN) tablet 500 mg (500 mg Oral Given 06/25/23 2355)  magnesium sulfate IVPB 2 g 50 mL (0 g Intravenous Stopped 06/26/23 0812)  dextrose 50 % solution 25 mL (25 mLs Intravenous Given 06/26/23  1610)    Mobility non-ambulatory     Focused Assessments Pulmonary Assessment Handoff:  Lung sounds:   O2 Device: Room Air      R Recommendations: See Admitting Provider Note  Report given to:   Additional Notes:

## 2023-06-27 DIAGNOSIS — D509 Iron deficiency anemia, unspecified: Secondary | ICD-10-CM

## 2023-06-27 DIAGNOSIS — I10 Essential (primary) hypertension: Secondary | ICD-10-CM | POA: Diagnosis not present

## 2023-06-27 DIAGNOSIS — E11649 Type 2 diabetes mellitus with hypoglycemia without coma: Secondary | ICD-10-CM | POA: Diagnosis not present

## 2023-06-27 DIAGNOSIS — T8743 Infection of amputation stump, right lower extremity: Secondary | ICD-10-CM | POA: Diagnosis not present

## 2023-06-27 LAB — GLUCOSE, CAPILLARY
Glucose-Capillary: 193 mg/dL — ABNORMAL HIGH (ref 70–99)
Glucose-Capillary: 211 mg/dL — ABNORMAL HIGH (ref 70–99)
Glucose-Capillary: 232 mg/dL — ABNORMAL HIGH (ref 70–99)
Glucose-Capillary: 235 mg/dL — ABNORMAL HIGH (ref 70–99)

## 2023-06-27 MED ORDER — BISACODYL 10 MG RE SUPP
10.0000 mg | Freq: Once | RECTAL | Status: AC
  Administered 2023-06-27: 10 mg via RECTAL
  Filled 2023-06-27: qty 1

## 2023-06-27 MED ORDER — FLEET ENEMA 7-19 GM/118ML RE ENEM: 1.0000 | ENEMA | Freq: Every day | RECTAL | Status: DC | PRN

## 2023-06-27 MED ORDER — CARVEDILOL 6.25 MG PO TABS
6.2500 mg | ORAL_TABLET | Freq: Two times a day (BID) | ORAL | Status: DC
  Administered 2023-06-28 – 2023-07-09 (×23): 6.25 mg via ORAL
  Filled 2023-06-27 (×10): qty 1
  Filled 2023-06-27: qty 2
  Filled 2023-06-27 (×12): qty 1

## 2023-06-27 NOTE — Progress Notes (Signed)
[facility] PROGRESS NOTE   Jose [name] ZDG:[id number] DOB: July 09, 1961 DOA: 06/25/2023  PCP: [name]  Brief History/Interval Summary: 62 year old African-American male with past medical history of peripheral disease status post right BKA on July 6 thousand 24, history of type 2 diabetes, hypertension, CKD stage IIIb, coronary disease, hyperlipidemia presented to the ER from the vascular surgery office.  Patient states that he has noticed increasing pain and drainage from the right BKA stump.  Patient seen and vascular surgery clinic.  He had evidence of infection at the stump.  Patient sent to the hospital for IV antibiotics and admission.     Consultants: Vascular surgery  Procedures: None yet    Subjective/Interval History: Patient mentions that the pain is better controlled today compared to yesterday.  Still has not had a bowel movement.  No other complaints offered.     Assessment/Plan:  Infection of the right BKA stump Followed by vascular surgery.  Seen by vascular surgery here in the hospital.  Surgery was offered but patient wants to hold off for now.  Continue antibiotics.  Noted to be on vancomycin and Zosyn. Noted to be febrile overnight.  WBC is noted to be normal. Follow-up on cultures. Pain control.  Constipation This is likely secondary to pain medications.  Patient started on aggressive bowel regimen with twice a day MiraLAX and Senokot.  TSH noted to be normal. Will give Dulcolax suppository today.  If this is not effective then may need to use enemas.  Will initiate aggressive bowel regimen.  Enemas as needed.  Check TSH.  Chronic kidney disease stage IIIb hyponatremia Creatinine seems to be between 1.5-2.0.  Seems to be close to baseline.  Monitor urine output.  Avoid nephrotoxic agents. Based on previous labs there is some chronicity to his hyponatremia.  Levels are stable.  He is asymptomatic.  Continue to monitor on a daily  basis. Noted to be on sodium bicarbonate.  Microcytic anemia Hemoglobin seems to be between 10-12.  Significant drop in hemoglobin over the past month or so.  Probably due to his infection and recent surgery chronic disease.   Anemia panel reviewed.  Ferritin 1502, iron 8, TIBC 162, percent saturation 5.  Vitamin B12 1057 and folate 19.0.  Elevated ferritin is likely reflective of his acute infection.  Monitor hemoglobin closely.  May need transfusion during this hospital stay.    Diabetes mellitus type 2, uncontrolled with hypoglycemia Was noted to be hypoglycemic yesterday.  His long-acting insulin was discontinued.  CBGs have stabilized.  Continue just SSI for now.  HbA1c was 7.7 in May.  Status post BKA in July of this year See above.  History of BPH Stable.  History of coronary artery disease Stable.  Continue aspirin.  Dyslipidemia Continue with statin.  Essential hypertension Noted to be on carvedilol and amlodipine.  Low blood pressure noted this morning.  Will hold these agents for now.  DVT Prophylaxis: Subcutaneous heparin Code Status: Full code Family Communication: Discussed with patient Disposition Plan: To be determined  Status is: Inpatient Remains inpatient appropriate because: Infection involving the right BKA stump requiring IV antibiotics    Medications: Scheduled:  amitriptyline  25 mg Oral QHS   amLODipine  10 mg Oral Daily   aspirin EC  81 mg Oral Daily   atorvastatin  80 mg Oral QPM   bisacodyl  10 mg Rectal Once   carvedilol  18.75 mg Oral BID WC   clopidogrel  75 mg Oral Q  breakfast   famotidine  20 mg Oral Daily   gabapentin  100 mg Oral TID   heparin  5,000 Units Subcutaneous Q8H   insulin aspart  0-9 Units Subcutaneous TID WC   mirtazapine  15 mg Oral QHS   pantoprazole  40 mg Oral Daily   polyethylene glycol  17 g Oral BID   senna-docusate  2 tablet Oral BID   sodium bicarbonate  650 mg Oral BID   Continuous:  piperacillin-tazobactam  (ZOSYN)  IV 3.375 g (06/27/23 0518)   vancomycin Stopped (06/26/23 2020)   WJX:BJYNWGNFAOZHY **OR** acetaminophen, cyclobenzaprine, HYDROmorphone (DILAUDID) injection, hydrOXYzine, ipratropium-albuterol, ondansetron **OR** ondansetron (ZOFRAN) IV, oxyCODONE-acetaminophen  Antibiotics: Anti-infectives (From admission, onward)    Start     Dose/Rate Route Frequency Ordered Stop   06/26/23 1800  vancomycin (VANCOCIN) IVPB 1000 mg/200 mL premix        1,000 mg 200 mL/hr over 60 Minutes Intravenous Every 24 hours 06/25/23 2223     06/26/23 0600  piperacillin-tazobactam (ZOSYN) IVPB 3.375 g        3.375 g 12.5 mL/hr over 240 Minutes Intravenous Every 8 hours 06/25/23 2223     06/25/23 1700  ceFEPIme (MAXIPIME) 2 g in sodium chloride 0.9 % 100 mL IVPB        2 g 200 mL/hr over 30 Minutes Intravenous  Once 06/25/23 1654 06/25/23 1944   06/25/23 1700  metroNIDAZOLE (FLAGYL) IVPB 500 mg        500 mg 100 mL/hr over 60 Minutes Intravenous  Once 06/25/23 1654 06/25/23 1923   06/25/23 1700  vancomycin (VANCOCIN) IVPB 1000 mg/200 mL premix  Status:  Discontinued        1,000 mg 200 mL/hr over 60 Minutes Intravenous  Once 06/25/23 1654 06/25/23 1656   06/25/23 1700  vancomycin (VANCOREADY) IVPB 1250 mg/250 mL        1,250 mg 166.7 mL/hr over 90 Minutes Intravenous  Once 06/25/23 1656 06/25/23 1952       Objective:  Vital Signs  Vitals:   06/27/23 0137 06/27/23 0414 06/27/23 0500 06/27/23 0717  BP: 118/63 126/67  93/74  Pulse: 69 72  74  Resp:  17  16  Temp: 98.2 F (36.8 C) 98.8 F (37.1 C)  98.7 F (37.1 C)  TempSrc: Oral Oral  Oral  SpO2: 100% 99%  99%  Weight:   69.5 kg   Height:        Intake/Output Summary (Last 24 hours) at 06/27/2023 0913 Last data filed at 06/27/2023 0521 Gross per 24 hour  Intake 868.98 ml  Output 775 ml  Net 93.98 ml   Filed Weights   06/25/23 2222 06/26/23 1219 06/27/23 0500  Weight: 65.8 kg 69.5 kg 69.5 kg    General appearance: Awake alert.  In  no distress Resp: Clear to auscultation bilaterally.  Normal effort Cardio: S1-S2 is normal regular.  No S3-S4.  No rubs murmurs or bruit GI: Abdomen is soft.  Nontender nondistended.  Bowel sounds are present normal.  No masses organomegaly Extremities: Right BKA stump covered in dressing   Lab Results:  Data Reviewed: I have personally reviewed following labs and reports of the imaging studies  CBC: Recent Labs  Lab 06/25/23 2001 06/26/23 0344 06/27/23 0038  WBC 9.3 9.0 8.3  NEUTROABS 6.3 6.0  --   HGB 7.8* 7.7* 7.4*  HCT 22.3* 22.4* 21.3*  MCV 77.4* 75.4* 74.7*  PLT 196 195 231    Basic Metabolic Panel: Recent Labs  Lab  06/25/23 1758 06/26/23 0344 06/27/23 0038  NA 128* 131* 126*  K 4.2 3.6 3.9  CL 95* 97* 96*  CO2 17* 19* 21*  GLUCOSE 157* 63* 229*  BUN 29* 25* 23  CREATININE 1.60* 1.45* 1.51*  CALCIUM 8.8* 8.6* 8.5*  MG  --  1.2* 1.7    GFR: Estimated Creatinine Clearance: 48.6 mL/min (A) (by C-G formula based on SCr of 1.51 mg/dL (H)).  Liver Function Tests: Recent Labs  Lab 06/25/23 1758 06/26/23 0344  AST 24 18  ALT 25 24  ALKPHOS 76 67  BILITOT 0.7 0.5  PROT 6.5 5.3*  ALBUMIN 2.9* 2.3*     Coagulation Profile: Recent Labs  Lab 06/25/23 1757  INR 1.1     CBG: Recent Labs  Lab 06/26/23 0648 06/26/23 0756 06/26/23 1223 06/26/23 1701 06/27/23 0759  GLUCAP 52* 142* 152* 181* 193*     Recent Results (from the past 240 hour(s))  Blood Culture (routine x 2)     Status: None (Preliminary result)   Collection Time: 06/25/23  5:44 PM   Specimen: BLOOD LEFT WRIST  Result Value Ref Range Status   Specimen Description BLOOD LEFT WRIST  Final   Special Requests   Final    BOTTLES DRAWN AEROBIC ONLY Blood Culture results may not be optimal due to an inadequate volume of blood received in culture bottles   Culture   Final    NO GROWTH 2 DAYS Performed at [facility], [address], [place], [place] [address]    Report Status  PENDING  Incomplete  Blood Culture (routine x 2)     Status: None (Preliminary result)   Collection Time: 06/25/23  5:58 PM   Specimen: BLOOD RIGHT HAND  Result Value Ref Range Status   Specimen Description BLOOD RIGHT HAND  Final   Special Requests   Final    BOTTLES DRAWN AEROBIC ONLY Blood Culture adequate volume   Culture   Final    NO GROWTH 2 DAYS Performed at [facility], [address], [place], [place] [address]    Report Status PENDING  Incomplete      Radiology Studies: DG Knee 2 Views Right  Result Date: 06/25/2023 CLINICAL DATA:  Infection EXAM: RIGHT KNEE - 1-2 VIEW COMPARISON:  None Available. FINDINGS: Patient is status post below the knee amputation, partially visualized intramedullary rod within the tibia. Extensive vascular calcifications. Cut margins appear smooth. Soft tissue swelling at the distal stump without soft tissue emphysema. No osseous destructive change IMPRESSION: Status post below the knee amputation. Soft tissue swelling at the distal stump without soft tissue emphysema or acute osseous abnormality. Electronically Signed   By: Jasmine Pang M.D.   On: 06/25/2023 17:30       LOS: 2 days    Rito Ehrlich  [facility] Pager on [email or web address]  06/27/2023, 9:13 AM

## 2023-06-27 NOTE — Plan of Care (Signed)

## 2023-06-27 NOTE — Progress Notes (Signed)
Patient ID: Jose [name], male   DOB: 08-18-1961, 62 y.o.   MRN: [id number]  Seen on morning rounds.  I again reviewed the patient's options in detail.  The patient is adamant about not wanting any further surgical therapy and giving himself some time.  I counseled him that antibiotics were not likely to heal this wound alone.  He is understanding and does not desire further surgical therapy.  Rande Brunt. Lenell Antu, MD [facility] Phone Number: [phone] 06/27/2023 10:42 AM

## 2023-06-28 ENCOUNTER — Encounter (HOSPITAL_COMMUNITY): Payer: Self-pay | Admitting: Internal Medicine

## 2023-06-28 DIAGNOSIS — T8743 Infection of amputation stump, right lower extremity: Secondary | ICD-10-CM | POA: Diagnosis not present

## 2023-06-28 LAB — BASIC METABOLIC PANEL WITH GFR
Anion gap: 9 (ref 5–15)
BUN: 19 mg/dL (ref 8–23)
CO2: 21 mmol/L — ABNORMAL LOW (ref 22–32)
Calcium: 8.6 mg/dL — ABNORMAL LOW (ref 8.9–10.3)
Chloride: 95 mmol/L — ABNORMAL LOW (ref 98–111)
Creatinine, Ser: 1.48 mg/dL — ABNORMAL HIGH (ref 0.61–1.24)
GFR, Estimated: 53 mL/min — ABNORMAL LOW (ref >60)
Glucose, Bld: 239 mg/dL — ABNORMAL HIGH (ref 70–99)
Potassium: 3.8 mmol/L (ref 3.5–5.1)
Sodium: 125 mmol/L — ABNORMAL LOW (ref 135–145)

## 2023-06-28 LAB — CBC
HCT: 22.7 % — ABNORMAL LOW (ref 39.0–52.0)
Hemoglobin: 8.1 g/dL — ABNORMAL LOW (ref 13.0–17.0)
MCH: 26 pg (ref 26.0–34.0)
MCHC: 35.7 g/dL (ref 30.0–36.0)
MCV: 72.8 fL — ABNORMAL LOW (ref 80.0–100.0)
Platelets: 233 10*3/uL (ref 150–400)
RBC: 3.12 MIL/uL — ABNORMAL LOW (ref 4.22–5.81)
RDW: 14.4 % (ref 11.5–15.5)
WBC: 7.4 10*3/uL (ref 4.0–10.5)
nRBC: 0 % (ref 0.0–0.2)

## 2023-06-28 LAB — GLUCOSE, CAPILLARY
Glucose-Capillary: 227 mg/dL — ABNORMAL HIGH (ref 70–99)
Glucose-Capillary: 245 mg/dL — ABNORMAL HIGH (ref 70–99)
Glucose-Capillary: 292 mg/dL — ABNORMAL HIGH (ref 70–99)
Glucose-Capillary: 271 mg/dL — ABNORMAL HIGH (ref 70–99)

## 2023-06-28 MED ORDER — LATANOPROST 0.005 % OP SOLN
1.0000 [drp] | Freq: Every day | OPHTHALMIC | Status: DC
  Administered 2023-06-28 – 2023-07-02 (×3): 1 [drp] via OPHTHALMIC
  Filled 2023-06-28: qty 2.5

## 2023-06-28 MED ORDER — ALBUTEROL SULFATE HFA 108 (90 BASE) MCG/ACT IN AERS: 2.0000 | INHALATION_SPRAY | Freq: Four times a day (QID) | RESPIRATORY_TRACT | Status: DC | PRN

## 2023-06-28 MED ORDER — BRIMONIDINE TARTRATE 0.2 % OP SOLN
1.0000 [drp] | Freq: Three times a day (TID) | OPHTHALMIC | Status: DC
  Administered 2023-06-28 – 2023-07-03 (×5): 1 [drp] via OPHTHALMIC
  Filled 2023-06-28 (×4): qty 5

## 2023-06-28 MED ORDER — INSULIN GLARGINE-YFGN 100 UNIT/ML ~~LOC~~ SOLN
15.0000 [IU] | Freq: Every day | SUBCUTANEOUS | Status: DC
  Filled 2023-06-28 (×2): qty 0.15

## 2023-06-28 MED ORDER — BRINZOLAMIDE 1 % OP SUSP
1.0000 [drp] | Freq: Three times a day (TID) | OPHTHALMIC | Status: DC
  Administered 2023-06-28 – 2023-07-03 (×4): 1 [drp] via OPHTHALMIC
  Filled 2023-06-28: qty 10

## 2023-06-28 MED ORDER — FLUTICASONE FUROATE-VILANTEROL 200-25 MCG/ACT IN AEPB
1.0000 | INHALATION_SPRAY | Freq: Every day | RESPIRATORY_TRACT | Status: DC
  Administered 2023-06-29 – 2023-07-09 (×10): 1 via RESPIRATORY_TRACT
  Filled 2023-06-28: qty 28

## 2023-06-28 MED ORDER — INSULIN ASPART 100 UNIT/ML IJ SOLN
2.0000 [IU] | Freq: Once | INTRAMUSCULAR | Status: AC
  Administered 2023-06-28: 2 [IU] via SUBCUTANEOUS

## 2023-06-28 NOTE — Progress Notes (Signed)
       Overnight   NAME: Jaymison Luber MRN: [id number] DOB : [birth year]    Date of Service   06/28/2023   HPI/Events of Note    Notified by RN for CBG and refusal of using Semglee   Interventions/ Plan   Coverage dose ordered       Chinita Greenland BSN MSNA MSN ACNPC-AG Acute Care Nurse Practitioner [facility]

## 2023-06-28 NOTE — Progress Notes (Signed)
[facility] PROGRESS NOTE  Jose [name] (DOB: 1961/08/05) ZOX:[id number] PCP: [name] Out[facility] Specialists: Vascular surgery, Dr. Edilia Bo.  Brief Narrative: Jose [name] is a 62 year old African-American male with past medical history of peripheral disease status post right BKA on July 6 thousand 24, history of type 2 diabetes, hypertension, CKD stage IIIb, coronary disease, hyperlipidemia presented to the ER from the vascular surgery office.  Patient states that he has noticed increasing pain and drainage from the right BKA stump.  Patient seen and vascular surgery clinic.  He had evidence of infection at the stump.  Patient sent to the hospital for IV antibiotics and admission.      Subjective: Pain throbbing in stump site is unchanged, thinks surgery may be the only way to improve that pain.   Objective: BP 130/68   Pulse 66   Temp 98.5 F (36.9 C) (Oral)   Resp 16   Ht 5\' 11"  (1.803 m)   Wt 69.5 kg   SpO2 100%   BMI 21.37 kg/m   Gen: No distress Pulm: Clear, nonlabored  CV: RRR, no MRG GI: Soft, NT, ND, +BS  Neuro: Alert and oriented. No new focal deficits. Ext: Warm, R BKA with exudate on dressing at stump site which is edematous and tender.   Assessment & Plan: Infection of right BKA stump - Now reconsidering and amenable to surgery, will be reassessed by vascular surgery who had recommended operative intervention. - Continue antibiotics with vancomycin and zosyn.  - Afebrile past 24 hours, blood cultures NGTD 3d.   Constipation: This is likely secondary to pain medications. - Continue miralax, senokot, can escalate further if needed. Abd is benign.      Stage IIIb CKD, chronic hyponatremia: Creatinine seems to be between 1.5-2.0.  Seems to be close to baseline.   - Monitor urine output.  Avoid nephrotoxic agents. - Continue sodium bicarbonate.  - Monitor Na level, stable.   Microcytic anemia: Hemoglobin seems to be between 10-12.   Significant drop in hemoglobin over the past month or so.  Probably due to his infection and recent surgery chronic disease.   Anemia panel reviewed.  Ferritin 1502, iron 8, TIBC 162, percent saturation 5.  Vitamin B12 1057 and folate 19.0.  Elevated ferritin is likely reflective of his acute infection.  - Monitor hemoglobin closely.  May need transfusion during this hospital stay.  Hgb 8.1.   Diabetes mellitus type 2, uncontrolled with hypoglycemia: HbA1c 7.7%.  - Restart lower dose glargine 15u qHS and monitor. This is half home dose and he's eating better.  - Continue SSI.    Status post BKA in July of this year: See above.   History of BPH: Stable.   CAD: No angina of late. - Continue ASA.   Dyslipidemia - Continue statin.   Essential hypertension - Hold coreg, norvasc with soft BPs  Tyrone Nine, MD [facility] [email or web address] 06/28/2023, 6:11 PM

## 2023-06-29 DIAGNOSIS — T8743 Infection of amputation stump, right lower extremity: Secondary | ICD-10-CM | POA: Diagnosis not present

## 2023-06-29 LAB — GLUCOSE, CAPILLARY
Glucose-Capillary: 199 mg/dL — ABNORMAL HIGH (ref 70–99)
Glucose-Capillary: 283 mg/dL — ABNORMAL HIGH (ref 70–99)
Glucose-Capillary: 274 mg/dL — ABNORMAL HIGH (ref 70–99)
Glucose-Capillary: 261 mg/dL — ABNORMAL HIGH (ref 70–99)

## 2023-06-29 LAB — VANCOMYCIN, PEAK: Vancomycin Pk: 30 ug/mL (ref 30–40)

## 2023-06-29 LAB — VANCOMYCIN, TROUGH: Vancomycin Tr: 10 ug/mL — ABNORMAL LOW (ref 15–20)

## 2023-06-29 MED ORDER — MIRTAZAPINE 15 MG PO TABS
30.0000 mg | ORAL_TABLET | Freq: Every day | ORAL | Status: DC
  Administered 2023-06-29 – 2023-07-02 (×4): 30 mg via ORAL
  Filled 2023-06-29 (×4): qty 2

## 2023-06-29 MED ORDER — INSULIN GLARGINE-YFGN 100 UNIT/ML ~~LOC~~ SOLN
30.0000 [IU] | Freq: Every day | SUBCUTANEOUS | Status: DC
  Administered 2023-06-29 – 2023-07-06 (×7): 30 [IU] via SUBCUTANEOUS
  Filled 2023-06-29 (×9): qty 0.3

## 2023-06-29 MED ORDER — OXYCODONE-ACETAMINOPHEN 5-325 MG PO TABS
1.0000 | ORAL_TABLET | ORAL | Status: DC | PRN
  Administered 2023-06-29: 1 via ORAL
  Administered 2023-06-29 – 2023-06-30 (×2): 2 via ORAL
  Administered 2023-06-30: 1 via ORAL
  Administered 2023-06-30 – 2023-07-02 (×5): 2 via ORAL
  Filled 2023-06-29 (×2): qty 2
  Filled 2023-06-29: qty 1
  Filled 2023-06-29 (×2): qty 2
  Filled 2023-06-29: qty 1
  Filled 2023-06-29 (×4): qty 2

## 2023-06-29 NOTE — Progress Notes (Signed)
[facility] PROGRESS NOTE  Jose [name] (DOB: 04/23/1961) XBJ:[id number] PCP: [name] Out[facility] Specialists: Vascular surgery, Dr. Edilia Bo.  Brief Narrative: Jose [name] is a 62 year old African-American male with past medical history of peripheral disease status post right BKA on July 6 thousand 24, history of type 2 diabetes, hypertension, CKD stage IIIb, coronary disease, hyperlipidemia presented to the ER from the vascular surgery office.  Patient states that he has noticed increasing pain and drainage from the right BKA stump.  Patient seen and vascular surgery clinic.  He had evidence of infection at the stump.  Patient sent to the hospital for IV antibiotics and admission.      Subjective: Throbbing pain not improved with current dose oxycodone. No sedation noted. Amenable to AKA. Vascular has been by, confirmed wound looked like it would not heal, recommended AKA, this will be scheduled later this week.  Objective: BP 110/73 (BP Location: Right Arm)   Pulse 77   Temp 97.9 F (36.6 C)   Resp 18   Ht 5\' 11"  (1.803 m)   Wt 69.5 kg   SpO2 99%   BMI 21.37 kg/m   Gen: No distress, but in evident discomfort Pulm: Clear, nonlabored  CV: RRR, no MRG GI: Soft, NT, ND, +BS Neuro: Alert and oriented. No new focal deficits. Ext: Warm, dry. R BKA dressing is c/d/I, no spreading/proximal erythema. Dressing taken down by vascular surgery shortly before my exam, not taken down again.  Assessment & Plan: Infection of right BKA stump - Plan for AKA per vascular surgery, timing TBD. - Continue antibiotics with vancomycin and zosyn.  - Afebrile past 24 hours, blood cultures NGTD 4d. - Augment pain control oxycodone to 5-10mg  prn in hopes this is used more often than IV dilaudid. .    Constipation: This is likely secondary to pain medications. Having BMs. Abd is benign.    - Continue miralax, senokot, can escalate further if needed.    Stage IIIb CKD,  chronic hyponatremia: Stable Cr at/near baseline. - Monitor urine output.  Avoid nephrotoxic agents. - Continue sodium bicarbonate.  - Monitor Na level, stable.   Microcytic anemia: Hemoglobin seems to be between 10-12.  Significant drop in hemoglobin over the past month or so.  Probably due to his infection and recent surgery chronic disease.   Anemia panel reviewed.  Ferritin 1502, iron 8, TIBC 162, percent saturation 5.  Vitamin B12 1057 and folate 19.0.  Elevated ferritin is likely reflective of his acute infection.  - Monitor hemoglobin closely.  May need transfusion during this hospital stay.  Hgb 7.5, will check labs with T&S once hgb goes below 7 and/or surgery timing is known.   Diabetes mellitus type 2, uncontrolled with hypoglycemia: HbA1c 7.7%.  - Continue basal insulin and SSI    Status post BKA in July of this year: See above.   History of BPH: Stable.   CAD: No angina of late. - Continue ASA.   Dyslipidemia - Continue statin.   Essential hypertension - Hold coreg, norvasc with soft BPs  Tyrone Nine, MD [facility] [email or web address] 06/29/2023, 4:05 PM

## 2023-06-29 NOTE — Progress Notes (Signed)
  Progress Note    06/29/2023 8:27 AM * No surgery found *  Subjective:  sweating overnight but afebrile.  Painful R BKA stump.  Now open to R AKA.   Vitals:   06/29/23 0733 06/29/23 0808  BP:  110/73  Pulse: 76 77  Resp: 18 18  Temp:  97.9 F (36.6 C)  SpO2: 98% 99%   Physical Exam: Lungs:  non labored Incisions:  R BKA with purulent drainage Neurologic: A&O; subjective fever  CBC    Component Value Date/Time   WBC 6.4 06/28/2023 2341   RBC 2.82 (L) 06/28/2023 2341   HGB 7.5 (L) 06/28/2023 2341   HCT 21.2 (L) 06/28/2023 2341   PLT 233 06/28/2023 2341   MCV 75.2 (L) 06/28/2023 2341   MCH 26.6 06/28/2023 2341   MCHC 35.4 06/28/2023 2341   RDW 14.4 06/28/2023 2341   LYMPHSABS 1.8 06/26/2023 0344   MONOABS 0.9 06/26/2023 0344   EOSABS 0.2 06/26/2023 0344   BASOSABS 0.0 06/26/2023 0344    BMET    Component Value Date/Time   NA 125 (L) 06/28/2023 2341   NA 131 (L) 12/14/2020 0849   K 3.7 06/28/2023 2341   CL 95 (L) 06/28/2023 2341   CO2 21 (L) 06/28/2023 2341   GLUCOSE 216 (H) 06/28/2023 2341   BUN 16 06/28/2023 2341   BUN 26 12/14/2020 0849   CREATININE 1.43 (H) 06/28/2023 2341   CALCIUM 8.6 (L) 06/28/2023 2341   GFRNONAA 55 (L) 06/28/2023 2341   GFRAA 53 (L) 12/14/2020 0849    INR    Component Value Date/Time   INR 1.1 06/25/2023 1757     Intake/Output Summary (Last 24 hours) at 06/29/2023 0827 Last data filed at 06/29/2023 0519 Gross per 24 hour  Intake --  Output 550 ml  Net -550 ml     Assessment/Plan:  62 y.o. male with clinically infected R BKA  Now agreeable to R AKA.  His preference is for Dr. Edilia Bo to perform the surgery.  He is aware this may not be feasible in the coming days based on OR and Dr. Adele Dan schedule.  We will follow up with definitive plans.   Emilie Rutter, PA-C [facility] [phone] 06/29/2023 8:27 AM  VASCULAR STAFF ADDENDUM: I have independently interviewed and examined the patient. I  agree with the above.  AKA Wednesday with Dr. Myra Gianotti.  Rande Brunt. Lenell Antu, MD [facility] Phone Number: [phone] 06/30/2023 4:27 PM

## 2023-06-29 NOTE — Progress Notes (Signed)
Pharmacy Antibiotic Note  Jose [name] is a 62 y.o. male admitted on 06/25/2023 with wound infection.  Pharmacy has been consulted for vancomycin and zosyn dosing. Patient now agreeable to BKA, planned for sometime this week, no clear timeframe. Vancomycin trough  and peak measured tonight 06/29/23.   Vancomycin trough = 10 mcg/ml and Vancomycin peak = 30 mcg/ml , steady state levels on Vancomycin 1000 mg IV q24hr.  Yields calculated AUC = 442, which is therapeutic. Goal AUC 400-550     Plan: Continue Vancomycin 1000 IV Q24H ( calcAUC 442, Scr 1.43, Vd 0.72) Continue Zosyn 3.375g Q8H Follow culture data for de-escalation.  Monitor renal function for dose adjustments as indicated.   Height: 5\' 11"  (180.3 cm) Weight: 69.5 kg (153 lb 3.5 oz) IBW/kg (Calculated) : 75.3  Temp (24hrs), Avg:98.5 F (36.9 C), Min:97.9 F (36.6 C), Max:99.3 F (37.4 C)  Recent Labs  Lab 06/25/23 1746 06/25/23 1758 06/25/23 2001 06/25/23 2017 06/26/23 0344 06/27/23 0038 06/28/23 0448 06/28/23 2341 06/29/23 1757 06/29/23 2014  WBC  --   --  9.3  --  9.0 8.3 7.4 6.4  --   --   CREATININE  --  1.60*  --   --  1.45* 1.51* 1.48* 1.43*  --   --   LATICACIDVEN 2.0*  --   --  1.1  --   --   --   --   --   --   VANCOTROUGH  --   --   --   --   --   --   --   --  10*  --   VANCOPEAK  --   --   --   --   --   --   --   --   --  30    Estimated Creatinine Clearance: 51.3 mL/min (A) (by C-G formula based on SCr of 1.43 mg/dL (H)).    Allergies  Allergen Reactions   Lisinopril-Hydrochlorothiazide Swelling and Other (See Comments)    Causes swelling of lips   Simvastatin Other (See Comments)    ALT (SGPT) level raised, Aspartate aminotransferase serum level raised    Thank you for allowing pharmacy to be a part of this patient's care.   Noah Delaine, RPh Clinical Pharmacist 06/29/2023, 9:50 PM

## 2023-06-29 NOTE — TOC Initial Note (Addendum)
Transition of Care (TOC) - Initial/Assessment Note   Spoke to patient at bedisde.   Confirmed face sheet information.   Patient discharged from hospital on 06/12/23 to [facility] term [facility] . Patient went for follow up appointment and was sent to [facility] ED.    Patient lives alone, has walker, wheel chair, shower chair and bedside commode.   Niece is contact person . Patient unsure if niece is aware he has been admitted to hospital. Patient called niece using NCM cell phone and left voicemail. NCM also left message with NCM direct cell number and patient's room number and hospital room phone number .  Patient has agreed to Rt AKA .   TOC team will continue to follow for post op PT recommendations   [facility] . patient's PCP is Dr. Louis [name], MSW with [facility] - [phone] x [phone] . Left message   April at [facility] returned call. Requested NCM to fax clinicals to Dr Jose [name] at [facility] at [phone] same done  Patient Details  Name: Jose [name] MRN: [id number] Date of Birth: 14-Feb-1961  Transition of Care [facility]) CM/SW Contact:    Kingsley Plan, RN Phone Number: 06/29/2023, 2:25 PM  Clinical Narrative:                     Barriers to Discharge: Continued Medical Work up   Patient Goals and CMS Choice Patient states their goals for this hospitalization and ongoing recovery are:: to return to rehab          Expected Discharge Plan and Services                         DME Arranged: N/A                    Prior Living Arrangements/Services     Patient language and need for interpreter reviewed:: Yes Do you feel safe going back to the place where you live?:  (see note)      Need for Family Participation in Patient Care: Yes (Comment) Care giver support system in place?: Yes (comment)   Criminal Activity/Legal Involvement Pertinent to Current  Situation/Hospitalization: No - Comment as needed  Activities of Daily Living Home Assistive Devices/Equipment: Environmental consultant (specify type), Shower chair with back ADL Screening (condition at time of admission) Patient's cognitive ability adequate to safely complete daily activities?: No Is the patient deaf or have difficulty hearing?: No Does the patient have difficulty seeing, even when wearing glasses/contacts?: No Does the patient have difficulty concentrating, remembering, or making decisions?: No Patient able to express need for assistance with ADLs?: Yes Does the patient have difficulty dressing or bathing?: No Independently performs ADLs?: Yes (appropriate for developmental age) Communication: Independent Dressing (OT): Independent Grooming: Independent Feeding: Independent Bathing: Independent Is this a change from baseline?: Pre-admission baseline Toileting: Independent Is this a change from baseline?: Pre-admission baseline In/Out Bed: Independent Is this a change from baseline?: Pre-admission baseline Walks in Home: Independent with device (comment) Does the patient have difficulty walking or climbing stairs?: Yes Weakness of Legs: Both Weakness of Arms/Hands: None  Permission Sought/Granted   Permission granted to share information with : Yes, Verbal Permission Granted  Share Information with NAME: niece Jose [name] [phone]           Emotional Assessment Appearance:: Appears stated age Attitude/Demeanor/Rapport:  Engaged Affect (typically observed): Accepting Orientation: : Oriented to Self, Oriented to Place, Oriented to  Time, Oriented to Situation Alcohol / Substance Use: Not Applicable Psych Involvement: No (comment)  Admission diagnosis:  Wound infection [T14.8XXA, L08.9] 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) [T87.43] Patient Active Problem List   Diagnosis Date Noted   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) 06/25/2023   Cellulitis  06/12/2023   No appetite 06/09/2023   S/P BKA (below knee amputation) unilateral, right (HCC) - on 05-30-2023 06/07/2023   Protein-calorie malnutrition, severe 06/03/2023   Nonhealing surgical wound, initial encounter 05/27/2023   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) 05/09/2023   Pancreatic mass 05/09/2023   Constipation 05/09/2023   Glaucoma 05/09/2023   PTSD (post-traumatic stress disorder) 08/14/2022   Nightmares associated with chronic post-traumatic stress disorder 08/14/2022   Major depressive disorder, 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/11/2022   Abnormality of aortic valve 01/29/2022   Depression    Hypertensive retinopathy of both eyes 02/01/2019   Chronic kidney disease, stage 3b (HCC) 08/28/2017   COPD (chronic obstructive pulmonary disease) (HCC) 08/28/2017   CAD (coronary artery disease) 04/10/2016   Hyperlipidemia 04/10/2016   Benign prostatic hyperplasia 04/10/2016   Diabetes (HCC) 04/10/2016   Type 2 diabetes mellitus with complication, with long-term current use of insulin (HCC) 10/17/2015   Dyslipidemia associated with type 2 diabetes mellitus (HCC) 10/17/2015   Chronic hyponatremia - baseline 125-134 05/12/2015   Hypertension associated with diabetes [facility])    PCP:  Cl[facility], [name] Pharmacy:   [facility] - [place], [place] - [address] [place] [address] Phone: [phone] Fax: [phone]  [facility] [phone] - [place], [place] - [address] AT [facility] [address] [place] [address] Phone: [phone] Fax: [phone]  [id number] - [place], [place] - [address] AT [facility] [address] [place] [address] Phone: [phone] Fax: [phone]     Social Determinants of Health (SDOH) Social History: SDOH Screenings   Food Insecurity: No  Food Insecurity (06/26/2023)  Housing: Patient Declined (06/26/2023)  Transportation Needs: No Transportation Needs (06/26/2023)  Utilities: Not At Risk (06/26/2023)  Depression (PHQ2-9): High Risk (08/11/2022)  Social Connections: Unknown (03/05/2023)   Received from [facility]  Tobacco Use: Medium Risk (06/28/2023)   SDOH Interventions:     Readmission Risk Interventions    05/29/2023    2:21 PM 04/29/2023    1:09 PM 01/29/2022    2:46 PM  Readmission Risk Prevention Plan  Transportation Screening Complete Complete Complete  PCP or Specialist Appt within 3-5 Days   Complete  HRI or Home Care Consult   Complete  Social Work Consult for Recovery Care Planning/Counseling   Complete  Palliative Care Screening   Not Applicable  Medication Review Oceanographer) Complete Complete Complete  PCP or Specialist appointment within 3-5 days of discharge Complete    HRI or Home Care Consult Complete Complete   SW Recovery Care/Counseling Consult Complete Complete   Palliative Care Screening Not Applicable Not Applicable   Skilled Nursing Facility Complete Not Applicable

## 2023-06-29 NOTE — Progress Notes (Signed)
   06/29/23 1100  Spiritual Encounters  Type of Visit Initial  Care provided to: Patient  Referral source Patient request  Reason for visit Routine spiritual support  OnCall Visit No  Spiritual Framework  Presenting Themes Meaning/purpose/sources of inspiration  Values/beliefs faith  Patient Stress Factors Health changes  Interventions  Spiritual Care Interventions Made Established relationship of care and support;Compassionate presence;Reflective listening;Normalization of emotions;Decision-making support/facilitation;Meaning making  Intervention Outcomes  Outcomes Awareness of support   Ch responded to request for emotional and spiritual support. There was no family at bedside. Pt is afraid about his upcoming surgery and wanted emotional/spiritual and decision making support. Ch helped pt process his thoughts and feelings. Ch remains available if needed.

## 2023-06-29 NOTE — Progress Notes (Signed)
Pharmacy Antibiotic Note  Jose [name] is a 62 y.o. male admitted on 06/25/2023 with wound infection.  Pharmacy has been consulted for vancomycin and zosyn dosing. Patient now agreeable to BKA, planned for sometime this week, no clear timeframe. Will get a vancomycin trough today and plan for a peak tmrw.    Plan: Vancomycin 1000 IV Q24H (eAUC 395, Scr 1.43, Vd 0.72) Zosyn 3.375g Q8H Follow culture data for de-escalation.  Monitor renal function for dose adjustments as indicated.   Height: 5\' 11"  (180.3 cm) Weight: 69.5 kg (153 lb 3.5 oz) IBW/kg (Calculated) : 75.3  Temp (24hrs), Avg:98.4 F (36.9 C), Min:97.9 F (36.6 C), Max:98.8 F (37.1 C)  Recent Labs  Lab 06/25/23 1746 06/25/23 1758 06/25/23 2001 06/25/23 2017 06/26/23 0344 06/27/23 0038 06/28/23 0448 06/28/23 2341  WBC  --   --  9.3  --  9.0 8.3 7.4 6.4  CREATININE  --  1.60*  --   --  1.45* 1.51* 1.48* 1.43*  LATICACIDVEN 2.0*  --   --  1.1  --   --   --   --     Estimated Creatinine Clearance: 51.3 mL/min (A) (by C-G formula based on SCr of 1.43 mg/dL (H)).    Allergies  Allergen Reactions   Lisinopril-Hydrochlorothiazide Swelling and Other (See Comments)    Causes swelling of lips   Simvastatin Other (See Comments)    ALT (SGPT) level raised, Aspartate aminotransferase serum level raised    Thank you for allowing pharmacy to be a part of this patient's care.   Estill Batten, PharmD, BCCCP  Clinical Pharmacist 06/29/2023, 12:46 PM

## 2023-06-30 ENCOUNTER — Ambulatory Visit: Payer: No Typology Code available for payment source | Admitting: Cardiovascular Disease

## 2023-06-30 DIAGNOSIS — T8743 Infection of amputation stump, right lower extremity: Secondary | ICD-10-CM | POA: Diagnosis not present

## 2023-06-30 LAB — TYPE AND SCREEN
ABO/RH(D): A POS
Antibody Screen: NEGATIVE

## 2023-06-30 LAB — ABO/RH: ABO/RH(D): A POS

## 2023-06-30 LAB — GLUCOSE, CAPILLARY
Glucose-Capillary: 213 mg/dL — ABNORMAL HIGH (ref 70–99)
Glucose-Capillary: 232 mg/dL — ABNORMAL HIGH (ref 70–99)
Glucose-Capillary: 220 mg/dL — ABNORMAL HIGH (ref 70–99)
Glucose-Capillary: 284 mg/dL — ABNORMAL HIGH (ref 70–99)

## 2023-06-30 NOTE — Anesthesia Preprocedure Evaluation (Addendum)
Anesthesia Evaluation  Patient identified by MRN, date of birth, ID band Patient awake    Reviewed: Allergy & Precautions, NPO status , Patient's Chart, lab work & pertinent test results, reviewed documented beta blocker date and time   Airway Mallampati: II  TM Distance: >3 FB Neck ROM: Full    Dental  (+) Dental Advisory Given   Pulmonary COPD,  COPD inhaler, former smoker   Pulmonary exam normal        Cardiovascular hypertension, Pt. on medications and Pt. on home beta blockers + CAD, + Past MI, + Cardiac Stents and + Peripheral Vascular Disease  Normal cardiovascular exam   '23 TTE - EF 65 to 70%. There is moderate left ventricular hypertrophy. Grade I diastolic dysfunction (impaired relaxation). Trivial mitral valve regurgitation. The non-coronary cusp of the aortic valve is thickened and echogenic with a small <5 mm mobile density noted on the non-coronary cusp - ?thrombus or unlikely vegetation, possible artfiact - no AI noted.      Neuro/Psych  PSYCHIATRIC DISORDERS Anxiety Depression    negative neurological ROS     GI/Hepatic negative GI ROS,,,(+)     substance abuse  alcohol use, cocaine use and marijuana use  Endo/Other  diabetes, Insulin Dependent   Na 127 (chronic)   Renal/GU CRFRenal disease     Musculoskeletal negative musculoskeletal ROS (+)    Abdominal   Peds  Hematology  (+) Blood dyscrasia (Plavix), anemia   Anesthesia Other Findings   Reproductive/Obstetrics                             Anesthesia Physical Anesthesia Plan  ASA: 4  Anesthesia Plan: General   Post-op Pain Management: Ofirmev IV (intra-op)* and Ketamine IV*   Induction: Intravenous  PONV Risk Score and Plan: 2 and Ondansetron, Dexamethasone, Midazolam and Treatment may vary due to age or medical condition  Airway Management Planned: LMA  Additional Equipment: None  Intra-op Plan:    Post-operative Plan: Extubation in OR  Informed Consent: I have reviewed the patients History and Physical, chart, labs and discussed the procedure including the risks, benefits and alternatives for the proposed anesthesia with the patient or authorized representative who has indicated his/her understanding and acceptance.     Dental advisory given  Plan Discussed with: CRNA and Anesthesiologist  Anesthesia Plan Comments:        Anesthesia Quick Evaluation

## 2023-06-30 NOTE — Inpatient Diabetes Management (Signed)
Inpatient Diabetes Program Recommendations  AACE/ADA: New Consensus Statement on Inpatient Glycemic Control (2015)  Target Ranges:  Prepandial:   less than 140 mg/dL      Peak postprandial:   less than 180 mg/dL (1-2 hours)      Critically ill patients:  140 - 180 mg/dL   Lab Results  Component Value Date   GLUCAP 232 (H) 06/30/2023   HGBA1C 7.7 (H) 04/24/2023    Review of Glycemic Control  Latest Reference Range & Units 06/29/23 11:46 06/29/23 16:19 06/29/23 21:22 06/30/23 08:29 06/30/23 11:29  Glucose-Capillary 70 - 99 mg/dL 161 (H) 096 (H) 045 (H) 213 (H) 232 (H)  (H): Data is abnormally high  Diabetes history: DM2  Outpatient Diabetes medications:  Lantus 30 units every day Novolog 0-9 units TID Noovlog 4 units TID with meals  Current orders for Inpatient glycemic control:  Semglee 30 units every day Noovlog 0-9 units TID  Inpatient Diabetes Program Recommendations:    Please consider:  Semglee 34 units QAM Novolog 0-15 units TID and 0-5 units at bedtime  Will continue to follow while inpatient.  Thank you, Dulce Sellar, MSN, CDCES Diabetes Coordinator Inpatient Diabetes Program [phone] (team pager from 8a-5p)

## 2023-06-30 NOTE — Progress Notes (Signed)
Patient ID: Jose [name], male   DOB: December 13, 1960, 62 y.o.   MRN: [id number]  Patient now amenable to AKA. Will plan to do this tomorrow at 0830. Discussed with patient and he is amenable. Orders for consent written.   Rande Brunt. Lenell Antu, MD [facility] Phone Number: [phone] 06/30/2023 4:56 PM

## 2023-06-30 NOTE — Progress Notes (Signed)
[facility] PROGRESS NOTE  Jose [name] (DOB: [birth year]) [id number] PCP: [name] Outpatient Specialists: Vascular surgery, Dr. Edilia Bo.  Brief Narrative: Jose [name] is a 62 year old African-American male with past medical history of peripheral disease status post right BKA on July 6 thousand 24, history of type 2 diabetes, hypertension, CKD stage IIIb, coronary disease, hyperlipidemia presented to the ER from the vascular surgery office.  Patient states that he has noticed increasing pain and drainage from the right BKA stump.  Patient seen and vascular surgery clinic.  He had evidence of infection at the stump.  Patient sent to the hospital for IV antibiotics and admission.      Subjective: Pain much better controlled, actually got some sleep. No fevers, chills, dyspnea, chest pain or abd pain. Eating ok. He's been drinking a lot of water to help with constipation. Was told surgery Friday.  Objective: BP 134/80 (BP Location: Left Arm)   Pulse 73   Temp 98.2 F (36.8 C)   Resp 17   Ht 5\' 11"  (1.803 m)   Wt 71.6 kg   SpO2 100%   BMI 22.02 kg/m   Gen: No distress, appears well Pulm: Clear, nonlabored  CV: RRR, no MRG GI: Soft, NT, ND, +BS Neuro: Alert and oriented. No new focal deficits. Ext: Warm, R BKA, tender, dressing not taken down, no proximal erythema Skin: No new rashes, lesions or ulcers on visualized skin   Assessment & Plan: Infection of right BKA stump - Plan for AKA per vascular surgery, timing TBD. Perhaps Friday.  - Continue antibiotics with vancomycin and zosyn.  - Afebrile past 24 hours, blood cultures NGTD 5d - Augment pain control oxycodone to 5-10mg  prn in hopes this is used more often than IV dilaudid. This has improved symptoms dramatically without over sedation, will continue.   Constipation: This is likely secondary to pain medications. Having BMs. Abd is benign.    - Continue miralax, senokot, both ordered higher  dose and frequency. Abd benign.   Stage IIIb CKD, chronic hyponatremia: Stable Cr at/near baseline. - Monitor urine output.  Avoid nephrotoxic agents. - Continue sodium bicarbonate.  - Monitor Na level, stable. Institute fluid restriction.   Microcytic anemia: Hemoglobin seems to be between 10-12.  Significant drop in hemoglobin over the past month or so.  Probably due to his infection and recent surgery chronic disease.   Anemia panel reviewed.  Ferritin 1502, iron 8, TIBC 162, percent saturation 5.  Vitamin B12 1057 and folate 19.0.  Elevated ferritin is likely reflective of his acute infection.  - Monitor hemoglobin closely.  May need transfusion during this hospital stay.  Hgb 7.5, will check labs with T&S once hgb goes below 7 and/or surgery timing is known.   Diabetes mellitus type 2, uncontrolled with hypoglycemia: HbA1c 7.7%.  - Continue basal insulin and SSI    Status post BKA in July of this year: See above.   History of BPH: Stable.   CAD: No angina of late. - Continue ASA.   Dyslipidemia - Continue statin.   Essential hypertension - Hold coreg, norvasc with soft BPs  Tyrone Nine, MD [facility] [email or web address] 06/30/2023, 9:26 AM

## 2023-07-01 ENCOUNTER — Inpatient Hospital Stay (HOSPITAL_COMMUNITY): Payer: No Typology Code available for payment source | Admitting: Anesthesiology

## 2023-07-01 ENCOUNTER — Encounter (HOSPITAL_COMMUNITY)
Admission: EM | Disposition: A | Discharge status: Skilled Nursing Facility | Payer: Self-pay | Source: Skilled Nursing Facility | Attending: Family Medicine

## 2023-07-01 ENCOUNTER — Other Ambulatory Visit: Payer: Self-pay

## 2023-07-01 ENCOUNTER — Encounter (HOSPITAL_COMMUNITY): Payer: Self-pay | Admitting: Internal Medicine

## 2023-07-01 DIAGNOSIS — E1122 Type 2 diabetes mellitus with diabetic chronic kidney disease: Secondary | ICD-10-CM | POA: Diagnosis not present

## 2023-07-01 DIAGNOSIS — T8743 Infection of amputation stump, right lower extremity: Secondary | ICD-10-CM | POA: Diagnosis not present

## 2023-07-01 DIAGNOSIS — T8781 Dehiscence of amputation stump: Secondary | ICD-10-CM

## 2023-07-01 DIAGNOSIS — I129 Hypertensive chronic kidney disease with stage 1 through stage 4 chronic kidney disease, or unspecified chronic kidney disease: Secondary | ICD-10-CM

## 2023-07-01 DIAGNOSIS — N1832 Chronic kidney disease, stage 3b: Secondary | ICD-10-CM

## 2023-07-01 DIAGNOSIS — T8789 Other complications of amputation stump: Secondary | ICD-10-CM | POA: Diagnosis not present

## 2023-07-01 DIAGNOSIS — I2510 Atherosclerotic heart disease of native coronary artery without angina pectoris: Secondary | ICD-10-CM | POA: Diagnosis not present

## 2023-07-01 HISTORY — PX: APPLICATION OF WOUND VAC: SHX5189

## 2023-07-01 HISTORY — PX: AMPUTATION: SHX166

## 2023-07-01 LAB — SURGICAL PCR SCREEN
MRSA, PCR: NEGATIVE
Staphylococcus aureus: NEGATIVE

## 2023-07-01 LAB — PREPARE RBC (CROSSMATCH)

## 2023-07-01 LAB — GLUCOSE, CAPILLARY
Glucose-Capillary: 145 mg/dL — ABNORMAL HIGH (ref 70–99)
Glucose-Capillary: 123 mg/dL — ABNORMAL HIGH (ref 70–99)
Glucose-Capillary: 131 mg/dL — ABNORMAL HIGH (ref 70–99)
Glucose-Capillary: 264 mg/dL — ABNORMAL HIGH (ref 70–99)
Glucose-Capillary: 306 mg/dL — ABNORMAL HIGH (ref 70–99)

## 2023-07-01 SURGERY — AMPUTATION, ABOVE KNEE
Anesthesia: General | Site: Leg Upper | Laterality: Right

## 2023-07-01 MED ORDER — ONDANSETRON HCL 4 MG/2ML IJ SOLN
INTRAMUSCULAR | Status: AC
  Filled 2023-07-01: qty 2

## 2023-07-01 MED ORDER — INSULIN ASPART 100 UNIT/ML IJ SOLN
0.0000 [IU] | INTRAMUSCULAR | Status: DC | PRN
  Administered 2023-07-01: 2 [IU] via SUBCUTANEOUS

## 2023-07-01 MED ORDER — FENTANYL CITRATE (PF) 100 MCG/2ML IJ SOLN
25.0000 ug | INTRAMUSCULAR | Status: DC | PRN
  Administered 2023-07-01 (×4): 25 ug via INTRAVENOUS

## 2023-07-01 MED ORDER — FENTANYL CITRATE (PF) 250 MCG/5ML IJ SOLN
INTRAMUSCULAR | Status: DC | PRN
  Administered 2023-07-01 (×2): 25 ug via INTRAVENOUS
  Administered 2023-07-01: 50 ug via INTRAVENOUS
  Administered 2023-07-01: 25 ug via INTRAVENOUS

## 2023-07-01 MED ORDER — ACETAMINOPHEN 500 MG PO TABS
1000.0000 mg | ORAL_TABLET | Freq: Once | ORAL | Status: AC
  Administered 2023-07-01: 1000 mg via ORAL
  Filled 2023-07-01: qty 2

## 2023-07-01 MED ORDER — ONDANSETRON HCL 4 MG/2ML IJ SOLN
INTRAMUSCULAR | Status: DC | PRN
  Administered 2023-07-01: 4 mg via INTRAVENOUS

## 2023-07-01 MED ORDER — LIDOCAINE 2% (20 MG/ML) 5 ML SYRINGE
INTRAMUSCULAR | Status: DC | PRN
  Administered 2023-07-01: 60 mg via INTRAVENOUS

## 2023-07-01 MED ORDER — LIDOCAINE 2% (20 MG/ML) 5 ML SYRINGE
INTRAMUSCULAR | Status: AC
  Filled 2023-07-01: qty 5

## 2023-07-01 MED ORDER — SODIUM CHLORIDE 0.9% IV SOLUTION: Freq: Once | INTRAVENOUS | Status: DC

## 2023-07-01 MED ORDER — DEXAMETHASONE SODIUM PHOSPHATE 10 MG/ML IJ SOLN
INTRAMUSCULAR | Status: DC | PRN
  Administered 2023-07-01: 4 mg via INTRAVENOUS

## 2023-07-01 MED ORDER — KETAMINE HCL 50 MG/5ML IJ SOSY
PREFILLED_SYRINGE | INTRAMUSCULAR | Status: AC
  Filled 2023-07-01: qty 5

## 2023-07-01 MED ORDER — MIDAZOLAM HCL (PF) 10 MG/2ML IJ SOLN
INTRAMUSCULAR | Status: AC
  Filled 2023-07-01: qty 2

## 2023-07-01 MED ORDER — INSULIN ASPART 100 UNIT/ML IJ SOLN
INTRAMUSCULAR | Status: AC
  Filled 2023-07-01: qty 1

## 2023-07-01 MED ORDER — STERILE WATER FOR IRRIGATION IR SOLN
Status: DC | PRN
Start: 2023-07-01 — End: 2023-07-01
  Administered 2023-07-01: 1000 mL

## 2023-07-01 MED ORDER — ACETAMINOPHEN 10 MG/ML IV SOLN
INTRAVENOUS | Status: DC | PRN
  Administered 2023-07-01: 1000 mg via INTRAVENOUS

## 2023-07-01 MED ORDER — 0.9 % SODIUM CHLORIDE (POUR BTL) OPTIME
TOPICAL | Status: DC | PRN
  Administered 2023-07-01: 1000 mL

## 2023-07-01 MED ORDER — FENTANYL CITRATE (PF) 250 MCG/5ML IJ SOLN
INTRAMUSCULAR | Status: AC
  Filled 2023-07-01: qty 5

## 2023-07-01 MED ORDER — CHLORHEXIDINE GLUCONATE 0.12 % MT SOLN
15.0000 mL | Freq: Once | OROMUCOSAL | Status: AC
  Administered 2023-07-01: 15 mL via OROMUCOSAL

## 2023-07-01 MED ORDER — MIDAZOLAM HCL 2 MG/2ML IJ SOLN
INTRAMUSCULAR | Status: DC | PRN
  Administered 2023-07-01: 2 mg via INTRAVENOUS

## 2023-07-01 MED ORDER — OXYCODONE HCL 5 MG PO TABS
ORAL_TABLET | ORAL | Status: AC
  Filled 2023-07-01: qty 1

## 2023-07-01 MED ORDER — PROMETHAZINE HCL 25 MG/ML IJ SOLN: 6.2500 mg | INTRAMUSCULAR | Status: DC | PRN

## 2023-07-01 MED ORDER — AMLODIPINE BESYLATE 10 MG PO TABS
10.0000 mg | ORAL_TABLET | Freq: Every day | ORAL | Status: DC
  Administered 2023-07-01 – 2023-07-09 (×9): 10 mg via ORAL
  Filled 2023-07-01 (×9): qty 1

## 2023-07-01 MED ORDER — PROPOFOL 10 MG/ML IV BOLUS
INTRAVENOUS | Status: AC
  Filled 2023-07-01: qty 20

## 2023-07-01 MED ORDER — PROPOFOL 10 MG/ML IV BOLUS
INTRAVENOUS | Status: DC | PRN
Start: 2023-07-01 — End: 2023-07-01
  Administered 2023-07-01: 120 mg via INTRAVENOUS

## 2023-07-01 MED ORDER — FENTANYL CITRATE (PF) 100 MCG/2ML IJ SOLN
INTRAMUSCULAR | Status: AC
  Filled 2023-07-01: qty 2

## 2023-07-01 MED ORDER — KETAMINE HCL 10 MG/ML IJ SOLN
INTRAMUSCULAR | Status: DC | PRN
  Administered 2023-07-01: 20 mg via INTRAVENOUS
  Administered 2023-07-01: 15 mg via INTRAVENOUS

## 2023-07-01 MED ORDER — OXYCODONE HCL 5 MG/5ML PO SOLN: 5.0000 mg | Freq: Once | ORAL | Status: AC | PRN

## 2023-07-01 MED ORDER — LACTATED RINGERS IV SOLN: INTRAVENOUS | Status: DC

## 2023-07-01 MED ORDER — ORAL CARE MOUTH RINSE: 15.0000 mL | Freq: Once | OROMUCOSAL | Status: AC

## 2023-07-01 MED ORDER — OXYCODONE HCL 5 MG PO TABS
5.0000 mg | ORAL_TABLET | Freq: Once | ORAL | Status: AC | PRN
  Administered 2023-07-01: 5 mg via ORAL

## 2023-07-01 MED ORDER — PHENYLEPHRINE HCL-NACL 20-0.9 MG/250ML-% IV SOLN
INTRAVENOUS | Status: DC | PRN
  Administered 2023-07-01: 30 ug/min via INTRAVENOUS

## 2023-07-01 MED ORDER — DEXAMETHASONE SODIUM PHOSPHATE 10 MG/ML IJ SOLN
INTRAMUSCULAR | Status: AC
  Filled 2023-07-01: qty 1

## 2023-07-01 MED ORDER — ACETAMINOPHEN 10 MG/ML IV SOLN
INTRAVENOUS | Status: AC
  Filled 2023-07-01: qty 100

## 2023-07-01 SURGICAL SUPPLY — 71 items
APL PRP STRL LF DISP 70% ISPRP (MISCELLANEOUS) ×2
BAG COUNTER SPONGE SURGICOUNT (BAG) ×2 IMPLANT
BAG SPNG CNTER NS LX DISP (BAG) ×2
BANDAGE ESMARK 6X9 LF (GAUZE/BANDAGES/DRESSINGS) IMPLANT
BLADE SAGITTAL (BLADE)
BLADE SAGITTAL 25.0X1.19X90 (BLADE) ×2 IMPLANT
BLADE SAW THK.89X75X18XSGTL (BLADE) IMPLANT
BLADE SURG 21 STRL SS (BLADE) ×2 IMPLANT
BNDG CMPR 5X6 CHSV STRCH STRL (GAUZE/BANDAGES/DRESSINGS) ×2
BNDG CMPR 9X6 STRL LF SNTH (GAUZE/BANDAGES/DRESSINGS)
BNDG CMPR MED 10X6 ELC LF (GAUZE/BANDAGES/DRESSINGS) ×2
BNDG COHESIVE 6X5 TAN NS LF (GAUZE/BANDAGES/DRESSINGS) IMPLANT
BNDG COHESIVE 6X5 TAN ST LF (GAUZE/BANDAGES/DRESSINGS) ×2 IMPLANT
BNDG ELASTIC 4X5.8 VLCR STR LF (GAUZE/BANDAGES/DRESSINGS) ×2 IMPLANT
BNDG ELASTIC 6X10 VLCR STRL LF (GAUZE/BANDAGES/DRESSINGS) IMPLANT
BNDG ELASTIC 6X5.8 VLCR STR LF (GAUZE/BANDAGES/DRESSINGS) ×2 IMPLANT
BNDG ESMARK 6X9 LF (GAUZE/BANDAGES/DRESSINGS)
BNDG GAUZE DERMACEA FLUFF 4 (GAUZE/BANDAGES/DRESSINGS) ×2 IMPLANT
BNDG GZE DERMACEA 4 6PLY (GAUZE/BANDAGES/DRESSINGS) ×2
BUR DISC 0.8X25 (BURR) IMPLANT
CANISTER SUCT 3000ML PPV (MISCELLANEOUS) ×2 IMPLANT
CANISTER WOUND CARE 500ML ATS (WOUND CARE) ×2 IMPLANT
CHLORAPREP W/TINT 26 (MISCELLANEOUS) ×2 IMPLANT
COVER SURGICAL LIGHT HANDLE (MISCELLANEOUS) ×2 IMPLANT
CUFF TOURN SGL QUICK 24 (TOURNIQUET CUFF)
CUFF TOURN SGL QUICK 34 (TOURNIQUET CUFF)
CUFF TRNQT CYL 24X4X16.5-23 (TOURNIQUET CUFF) IMPLANT
CUFF TRNQT CYL 34X4.125X (TOURNIQUET CUFF) IMPLANT
DRAIN CHANNEL 19F RND (DRAIN) IMPLANT
DRAIN RELI 100 BL SUC LF ST (DRAIN)
DRAPE DERMATAC (DRAPES) IMPLANT
DRAPE HALF SHEET 40X57 (DRAPES) ×2 IMPLANT
DRAPE INCISE IOBAN 66X45 STRL (DRAPES) IMPLANT
DRAPE ORTHO SPLIT 77X108 STRL (DRAPES) ×4
DRAPE SURG ORHT 6 SPLT 77X108 (DRAPES) ×4 IMPLANT
DRESSING PEEL AND PLAC PRVNA20 (GAUZE/BANDAGES/DRESSINGS) IMPLANT
DRESSING PREVENA PLUS CUSTOM (GAUZE/BANDAGES/DRESSINGS) IMPLANT
DRSG PEEL AND PLACE PREVENA 20 (GAUZE/BANDAGES/DRESSINGS) ×2
DRSG PREVENA PLUS CUSTOM (GAUZE/BANDAGES/DRESSINGS)
ELECT CAUTERY BLADE 6.4 (BLADE) ×2 IMPLANT
ELECT REM PT RETURN 9FT ADLT (ELECTROSURGICAL) ×2
ELECTRODE REM PT RTRN 9FT ADLT (ELECTROSURGICAL) ×2 IMPLANT
EVACUATOR SILICONE 100CC (DRAIN) IMPLANT
GAUZE SPONGE 4X4 12PLY STRL (GAUZE/BANDAGES/DRESSINGS) ×2 IMPLANT
GAUZE XEROFORM 5X9 LF (GAUZE/BANDAGES/DRESSINGS) IMPLANT
GLOVE BIO SURGEON STRL SZ8 (GLOVE) ×2 IMPLANT
GOWN STRL REUS W/ TWL LRG LVL3 (GOWN DISPOSABLE) ×4 IMPLANT
GOWN STRL REUS W/ TWL XL LVL3 (GOWN DISPOSABLE) ×2 IMPLANT
GOWN STRL REUS W/TWL LRG LVL3 (GOWN DISPOSABLE) ×4
GOWN STRL REUS W/TWL XL LVL3 (GOWN DISPOSABLE) ×2
KIT BASIN OR (CUSTOM PROCEDURE TRAY) ×2 IMPLANT
KIT DRSG PREVENA PLUS 7DAY 125 (MISCELLANEOUS) IMPLANT
KIT PREVENA INCISION MGT20CM45 (CANNISTER) IMPLANT
KIT TURNOVER KIT B (KITS) ×2 IMPLANT
NS IRRIG 1000ML POUR BTL (IV SOLUTION) ×2 IMPLANT
PACK GENERAL/GYN (CUSTOM PROCEDURE TRAY) ×2 IMPLANT
PAD ARMBOARD 7.5X6 YLW CONV (MISCELLANEOUS) ×4 IMPLANT
PENCIL SMOKE EVACUATOR (MISCELLANEOUS) ×2 IMPLANT
PREVENA RESTOR ARTHOFORM 46X30 (CANNISTER) IMPLANT
RASP HELIOCORDIAL MED (MISCELLANEOUS) IMPLANT
STAPLER SKIN 35 REG (STAPLE) ×2 IMPLANT
STAPLER VISISTAT 35W (STAPLE) ×2 IMPLANT
STOCKINETTE IMPERVIOUS LG (DRAPES) ×2 IMPLANT
SUT ETHILON 2 0 PSLX (SUTURE) ×4 IMPLANT
SUT ETHILON 3 0 PS 1 (SUTURE) IMPLANT
SUT SILK 0 TIES 10X30 (SUTURE) ×2 IMPLANT
SUT SILK 2 0 SH CR/8 (SUTURE) ×2 IMPLANT
SUT VIC AB 2-0 CT1 18 (SUTURE) ×6 IMPLANT
TOWEL GREEN STERILE (TOWEL DISPOSABLE) ×4 IMPLANT
UNDERPAD 30X36 HEAVY ABSORB (UNDERPADS AND DIAPERS) ×2 IMPLANT
WATER STERILE IRR 1000ML POUR (IV SOLUTION) ×2 IMPLANT

## 2023-07-01 NOTE — TOC Progression Note (Signed)
Transition of Care (TOC) - Progression Note   Faxed update to Dr Maple Hudson at [facility]  Patient Details  Name: Jose [name] MRN: [id number] Date of Birth: 12-23-1960  [facility]) CM/SW Contact  , Adria Devon, RN Phone Number: 07/01/2023, 11:49 AM  Clinical Narrative:         Barriers to Discharge: Continued Medical Work up  Expected Discharge Plan and Services                         DME Arranged: N/A                     Social Determinants of Health (SDOH) Interventions SDOH Screenings   Food Insecurity: No Food Insecurity (06/26/2023)  Housing: Patient Declined (06/26/2023)  Transportation Needs: No Transportation Needs (06/26/2023)  Utilities: Not At Risk (06/26/2023)  Depression (PHQ2-9): High Risk (08/11/2022)  Social Connections: Unknown (03/05/2023)   Received from [facility]  Tobacco Use: Medium Risk (07/01/2023)    Readmission Risk Interventions    05/29/2023    2:21 PM 04/29/2023    1:09 PM 01/29/2022    2:46 PM  Readmission Risk Prevention Plan  Transportation Screening Complete Complete Complete  PCP or Specialist Appt within 3-5 Days   Complete  HRI or Home Care Consult   Complete  Social Work Consult for Recovery Care Planning/Counseling   Complete  Palliative Care Screening   Not Applicable  Medication Review Oceanographer) Complete Complete Complete  PCP or Specialist appointment within 3-5 days of discharge Complete    HRI or Home Care Consult Complete Complete   SW Recovery Care/Counseling Consult Complete Complete   Palliative Care Screening Not Applicable Not Applicable   Skilled Nursing Facility Complete Not Applicable

## 2023-07-01 NOTE — Transfer of Care (Signed)
Immediate Anesthesia Transfer of Care Note  Patient: Jose [name] Junior [name]  Procedure(s) Performed: RIGHT ABOVE KNEE AMPUTATION (Right: Knee) APPLICATION OF WOUND VAC (Right: Leg Upper)  Patient Location: PACU  Anesthesia Type:General  Level of Consciousness: drowsy, patient cooperative, and responds to stimulation  Airway & Oxygen Therapy: Patient Spontanous Breathing and Patient connected to nasal cannula oxygen  Post-op Assessment: Report given to RN, Post -op Vital signs reviewed and stable, Patient moving all extremities X 4, and Patient able to stick tongue midline  Post vital signs: Reviewed and stable  Last Vitals:  Vitals Value Taken Time  BP 185/84   Temp 98.5   Pulse 67 07/01/23 0957  Resp 13 07/01/23 0957  SpO2 100 % 07/01/23 0957  Vitals shown include unfiled device data.  Last Pain:  Vitals:   07/01/23 0824  TempSrc: Oral  PainSc:       Patients Stated Pain Goal: 0 (06/29/23 0716)  Complications: No notable events documented.

## 2023-07-01 NOTE — Anesthesia Postprocedure Evaluation (Signed)
Anesthesia Post Note  Patient: Jose [name] [employer]  Procedure(s) Performed: RIGHT ABOVE KNEE AMPUTATION (Right: Knee) APPLICATION OF WOUND VAC (Right: Leg Upper)     Patient location during evaluation: PACU Anesthesia Type: General Level of consciousness: awake and alert Pain management: pain level controlled Vital Signs Assessment: post-procedure vital signs reviewed and stable Respiratory status: spontaneous breathing, nonlabored ventilation and respiratory function stable Cardiovascular status: stable and blood pressure returned to baseline Anesthetic complications: no   No notable events documented.  Last Vitals:  Vitals:   07/01/23 1100 07/01/23 1132  BP: (!) 173/82 (!) 179/83  Pulse: 64 66  Resp: 13 16  Temp:  36.8 C  SpO2: 97% 100%    Last Pain:  Vitals:   07/01/23 1132  TempSrc: Oral  PainSc:                  Beryle Lathe

## 2023-07-01 NOTE — Anesthesia Procedure Notes (Signed)
Procedure Name: LMA Insertion Date/Time: 07/01/2023 8:47 AM  Performed by: Cy Blamer, CRNAPre-anesthesia Checklist: Patient identified, Emergency Drugs available, Suction available, Patient being monitored and Timeout performed Patient Re-evaluated:Patient Re-evaluated prior to induction Oxygen Delivery Method: Circle system utilized Preoxygenation: Pre-oxygenation with 100% oxygen Induction Type: IV induction LMA: LMA inserted LMA Size: 5.0 Number of attempts: 2 Placement Confirmation: positive ETCO2 Tube secured with: Tape Dental Injury: Teeth and Oropharynx as per pre-operative assessment  Comments: 1st attempt unable to seat properly, 2nd successful attempt by Dr. Mal Amabile

## 2023-07-01 NOTE — Plan of Care (Signed)
  Problem: Coping: Goal: Ability to adjust to condition or change in health will improve Outcome: Progressing   Problem: Fluid Volume: Goal: Ability to maintain a balanced intake and output will improve Outcome: Progressing   

## 2023-07-01 NOTE — Progress Notes (Signed)
[facility] PROGRESS NOTE  Jose [name] (DOB: 1961/11/21) [id number] PCP: [name] Out[facility] Specialists: Vascular surgery, Dr. Edilia Bo.  Brief Narrative: Jose [name] is a 62 year old African-American male with past medical history of peripheral disease status post right BKA on July 6 thousand 24, history of type 2 diabetes, hypertension, CKD stage IIIb, coronary disease, hyperlipidemia presented to the ER from the vascular surgery office.  Patient states that he has noticed increasing pain and drainage from the right BKA stump.  Patient seen and vascular surgery clinic.  He had evidence of infection at the stump.  Patient sent to the hospital for IV antibiotics and admission.      Subjective: No new complaints. His throbbing pain is stable, amenable to surgery today.   Objective: BP (!) 182/86   Pulse 67   Temp 98.5 F (36.9 C)   Resp (!) 25   Ht 5\' 11"  (1.803 m)   Wt 71.9 kg   SpO2 98%   BMI 22.11 kg/m   Gen: No distress Pulm: Clear, nonlabored  CV: RRR, no MRG or pitting dependent edema GI: Soft, NT, ND, +BS Neuro: Alert and oriented. No new focal deficits. Ext: Warm, R BKA Skin: No new rashes, lesions or ulcers on visualized skin   Assessment & Plan: Infection of right BKA stump: Undergoing right AKA 8/7 per Dr. Lenell Antu. - Continue antibiotics with vancomycin and zosyn. Defer duration postoperatively to VVS based on intraoperative findings. - Afebrile, blood cultures NGTD 5d - Continue pain control with oxycodone 5-10mg  prn and prn IV dilaudid.   Constipation: This is likely secondary to pain medications. Having BMs. Abd is benign.    - Continue miralax, senokot, both ordered higher dose and frequency. Abd benign.   Stage IIIb CKD, chronic hyponatremia: Stable Cr at/near baseline. - Monitor urine output.  Avoid nephrotoxic agents. - Continue sodium bicarbonate.  - Monitor Na level, stable. Institute fluid restriction.   Microcytic  anemia: Hemoglobin seems to be between 10-12.  Significant drop in hemoglobin over the past month or so.  Probably due to his infection and recent surgery chronic disease.   Anemia panel reviewed.  Ferritin 1502, iron 8, TIBC 162, percent saturation 5.  Vitamin B12 1057 and folate 19.0.  Elevated ferritin is likely reflective of his acute infection.  - Preop hgb 7.2g/dl > 1u RBCs today per VVS. Trend H/H postoperatively.   Diabetes mellitus type 2, uncontrolled with hypoglycemia: HbA1c 7.7%.  - Continue basal insulin and SSI    Status post BKA in July of this year: See above.   History of BPH: Stable.   CAD: No angina of late. - Continue ASA.   Dyslipidemia - Continue statin.   Essential hypertension - Held coreg, norvasc with soft BPs, will restart with elevated BPs  Tyrone Nine, MD [facility] [email or web address] 07/01/2023, 10:45 AM

## 2023-07-01 NOTE — Progress Notes (Addendum)
  Progress Note    07/01/2023 7:33 AM Day of Surgery  Scheduled for right AKA this morning with Dr. Lenell Antu Patient remains agreeable and did not have any questions this morning Hgb 7.2 this morning. Dont anticipate significant blood loss during surgery but will place order for 1 unit PRBC transfusion Keep NPO Consent ordered   [facility] [phone] 07/01/2023 7:33 AM  VASCULAR STAFF ADDENDUM: I have independently interviewed and examined the patient. I agree with the above.   Rande Brunt. Lenell Antu, MD [facility]  [facility] Phone Number: [phone] 07/01/2023 8:19 AM

## 2023-07-01 NOTE — Op Note (Signed)
DATE OF SERVICE: 07/01/2023  PATIENT:  Jose [name]  62 y.o. male  PRE-OPERATIVE DIAGNOSIS:  non-healing right below knee amputation  POST-OPERATIVE DIAGNOSIS:  Same  PROCEDURE:   Right above knee amputation  SURGEON:  Surgeons and Role:    * Leonie Douglas, MD - Primary  ASSISTANT: Nathanial Rancher, PA-C  An experienced assistant was required given the complexity of this procedure and the standard of surgical care. My assistant helped with exposure through counter tension, suctioning, ligation and retraction to better visualize the surgical field.  My assistant expedited sewing during the case by following my sutures. Wherever I use the term "we" in the report, my assistant actively helped me with that portion of the procedure.  ANESTHESIA:   general  EBL:  BLOOD ADMINISTERED:none  DRAINS: none   LOCAL MEDICATIONS USED:  NONE  SPECIMEN:  residual right leg  COUNTS: confirmed correct.  TOURNIQUET:  none  PATIENT DISPOSITION:  PACU - hemodynamically stable.   Delay start of Pharmacological VTE agent (>24hrs) due to surgical blood loss or risk of bleeding: no  INDICATION FOR PROCEDURE: Jose [name] is a 62 y.o. male with non-healing right below knee amputation. After careful discussion of risks, benefits, and alternatives the patient was offered right above knee amputation. The patient understood and wished to proceed.  OPERATIVE FINDINGS: healthy amputation margin  DESCRIPTION OF PROCEDURE: After identification of the patient in the pre-operative holding area, the patient was transferred to the operating room. The patient was positioned supine on the operating room table. Anesthesia was induced. The right leg was prepped and draped in standard fashion. A surgical pause was performed confirming correct patient, procedure, and operative location.  A generous right thigh fishmouth incision was planned on the right lower extremity with a skin marker. An incision  was made as planned. The incision was carried down with a #21 scalpel through the subcutaneous tissue, and through the posterior and anterior compartments of the thigh until the femur was encountered. The periosteum about the femur was elevated as high as possible. The femur was transected with a powered saw. The vascular bundle was found in its typical position, and was suture-ligated proximally using a 2-0 silk suture.  The sciatic nerve was identified in typical position, retracted, transected at the retracted margin, and ligated with a 2-0 silk suture.  Hemostasis was achieved in the surgical bed. The wound was copiously irrigated. The wound was closed in layers using 2-0 Vicryl, surgical stapler.  A prevena was applied.  Upon completion of the case instrument and sharps counts were confirmed correct. The patient was transferred to the PACU in good condition. I was present for all portions of the procedure.  FOLLOW UP PLAN: Assuming a normal postoperative course, VVS PA will see the patient in 2-3 weeks for wound check.   Jose [name]. Jose Antu, MD [facility] of [facility] Phone Number: [phone] 07/01/2023 9:45 AM

## 2023-07-02 ENCOUNTER — Encounter (HOSPITAL_COMMUNITY): Payer: Self-pay | Admitting: Vascular Surgery

## 2023-07-02 DIAGNOSIS — T8743 Infection of amputation stump, right lower extremity: Secondary | ICD-10-CM | POA: Diagnosis not present

## 2023-07-02 LAB — GLUCOSE, CAPILLARY
Glucose-Capillary: 300 mg/dL — ABNORMAL HIGH (ref 70–99)
Glucose-Capillary: 325 mg/dL — ABNORMAL HIGH (ref 70–99)
Glucose-Capillary: 240 mg/dL — ABNORMAL HIGH (ref 70–99)
Glucose-Capillary: 186 mg/dL — ABNORMAL HIGH (ref 70–99)

## 2023-07-02 MED ORDER — PIPERACILLIN-TAZOBACTAM 3.375 G IVPB
3.3750 g | Freq: Three times a day (TID) | INTRAVENOUS | Status: AC
  Administered 2023-07-02 (×2): 3.375 g via INTRAVENOUS
  Filled 2023-07-02 (×2): qty 50

## 2023-07-02 MED ORDER — VANCOMYCIN HCL IN DEXTROSE 1-5 GM/200ML-% IV SOLN
1000.0000 mg | INTRAVENOUS | Status: AC
  Administered 2023-07-02: 1000 mg via INTRAVENOUS
  Filled 2023-07-02: qty 200

## 2023-07-02 MED ORDER — INSULIN ASPART 100 UNIT/ML IJ SOLN
3.0000 [IU] | Freq: Three times a day (TID) | INTRAMUSCULAR | Status: DC
  Administered 2023-07-02 – 2023-07-07 (×14): 3 [IU] via SUBCUTANEOUS

## 2023-07-02 MED ORDER — HYDROMORPHONE HCL 1 MG/ML IJ SOLN
1.0000 mg | INTRAMUSCULAR | Status: DC | PRN
  Administered 2023-07-02 – 2023-07-09 (×25): 1 mg via INTRAVENOUS
  Filled 2023-07-02 (×25): qty 1

## 2023-07-02 MED ORDER — OXYCODONE-ACETAMINOPHEN 5-325 MG PO TABS
2.0000 | ORAL_TABLET | ORAL | Status: DC | PRN
  Administered 2023-07-02 – 2023-07-03 (×3): 3 via ORAL
  Administered 2023-07-04: 2 via ORAL
  Administered 2023-07-04: 3 via ORAL
  Administered 2023-07-05: 2 via ORAL
  Administered 2023-07-05 – 2023-07-06 (×4): 3 via ORAL
  Administered 2023-07-07 – 2023-07-09 (×5): 2 via ORAL
  Filled 2023-07-02: qty 2
  Filled 2023-07-02 (×3): qty 3
  Filled 2023-07-02 (×3): qty 2
  Filled 2023-07-02 (×2): qty 3
  Filled 2023-07-02: qty 2
  Filled 2023-07-02: qty 3
  Filled 2023-07-02 (×3): qty 2
  Filled 2023-07-02 (×2): qty 3
  Filled 2023-07-02: qty 2

## 2023-07-02 NOTE — Progress Notes (Signed)
[facility] PROGRESS NOTE  Jose [name] (DOB: 1961/04/10) ZHY:[id number] PCP: [name] Outpatient Specialists: Vascular surgery, Dr. Edilia Bo.  Brief Narrative: Jose [name] is a 62 year old African-American male with past medical history of peripheral disease status post right BKA on July 6 thousand 24, history of type 2 diabetes, hypertension, CKD stage IIIb, coronary disease, hyperlipidemia presented to the ER from the vascular surgery office.  Patient states that he has noticed increasing pain and drainage from the right BKA stump.  Patient seen and vascular surgery clinic.  He had evidence of infection at the stump.  Patient sent to the hospital for IV antibiotics and admission. Ultimately underwent AKA 8/7.   Subjective: Postoperative pain is severe but managed with pain medications as ordered.  Objective: BP (!) 149/83 (BP Location: Right Arm)   Pulse 80   Temp 98.2 F (36.8 C) (Oral)   Resp 18   Ht 5\' 11"  (1.803 m)   Wt 65.8 kg   SpO2 96%   BMI 20.23 kg/m   Gen: No distress Pulm: Clear, nonlabored  CV: RRR, no MRG GI: Soft, NT, ND, +BS  Neuro: Alert and oriented. No new focal deficits. Ext: Warm, dry, R AKA. Skin: R AKA dressing c/d/I, pravena in place.    Assessment & Plan: Infection of right BKA stump: Underwent right AKA 8/7 per Dr. Lenell Antu. - Continue abx x1 day postop per VVS - Afebrile, blood cultures NGTD 5d - Continue pain control with oxycodone 5-10mg  prn and prn IV dilaudid.  - PT/OT evaluations pending postoperatively  Constipation: This is likely secondary to pain medications. Having BMs. Abd is benign.    - Continue miralax, senokot, both ordered higher dose and frequency. Abd benign.   Stage IIIb CKD, chronic hyponatremia: Stable Cr at/near baseline. - Monitor urine output.  Avoid nephrotoxic agents. - Continue sodium bicarbonate.  - Monitor Na level, stable. Continue fluid restriction.   Microcytic anemia: Hemoglobin  seems to be between 10-12.  Significant drop in hemoglobin over the past month or so.  Probably due to his infection and recent surgery chronic disease.   Anemia panel reviewed.  Ferritin 1502, iron 8, TIBC 162, percent saturation 5.  Vitamin B12 1057 and folate 19.0.  Elevated ferritin is likely reflective of his acute infection.  - Preop hgb 7.2g/dl > 1u RBCs 8/7, hgb up to 9.6. Trend H/H postoperatively.   Diabetes mellitus type 2, uncontrolled with hypoglycemia: HbA1c 7.7%.  - Continue basal insulin and SSI     History of BPH: Stable.   CAD: No angina of late. - Continue ASA.   Dyslipidemia - Continue statin.   Essential hypertension - Held coreg, norvasc with soft BPs, will restart with elevated BPs  Tyrone Nine, MD [facility] [email or web address] 07/02/2023, 9:37 AM

## 2023-07-02 NOTE — Progress Notes (Signed)
6N secretary called asking if we had a bracelet for the patient from surgery on 8/7. Checked patient belonging log and nothing was checked in for the patient. Checked pre-op procedure checklist and documentation for belongings said none. Could not speak to pre-op RN d/t them being off for today. Spoke with PACU RN Holland Commons who did not see a bracelet or belongings. Called and updated Programmer, multimedia.  Hermina Barters, RN

## 2023-07-02 NOTE — Progress Notes (Addendum)
  Progress Note    07/02/2023 6:59 AM 1 Day Post-Op  Subjective:  having some stump pain   Vitals:   07/01/23 2043 07/02/23 0515  BP: (!) 162/89 (!) 164/93  Pulse: 76 75  Resp:    Temp: 98.6 F (37 C) 98.4 F (36.9 C)  SpO2: 99% 99%   Physical Exam: Cardiac:  regular Lungs:  non labored Incisions:  right AKA VAC to suction, good seal Neurologic: alert and oriented  CBC    Component Value Date/Time   WBC 8.8 07/02/2023 0012   RBC 3.62 (L) 07/02/2023 0012   HGB 9.6 (L) 07/02/2023 0012   HCT 27.2 (L) 07/02/2023 0012   PLT 352 07/02/2023 0012   MCV 75.1 (L) 07/02/2023 0012   MCH 26.5 07/02/2023 0012   MCHC 35.3 07/02/2023 0012   RDW 14.6 07/02/2023 0012   LYMPHSABS 1.8 06/26/2023 0344   MONOABS 0.9 06/26/2023 0344   EOSABS 0.2 06/26/2023 0344   BASOSABS 0.0 06/26/2023 0344    BMET    Component Value Date/Time   NA 129 (L) 07/02/2023 0012   NA 131 (L) 12/14/2020 0849   K 4.3 07/02/2023 0012   CL 96 (L) 07/02/2023 0012   CO2 21 (L) 07/02/2023 0012   GLUCOSE 266 (H) 07/02/2023 0012   BUN 18 07/02/2023 0012   BUN 26 12/14/2020 0849   CREATININE 1.31 (H) 07/02/2023 0012   CALCIUM 9.2 07/02/2023 0012   GFRNONAA >60 07/02/2023 0012   GFRAA 53 (L) 12/14/2020 0849    INR    Component Value Date/Time   INR 1.1 06/25/2023 1757     Intake/Output Summary (Last 24 hours) at 07/02/2023 0659 Last data filed at 07/02/2023 9147 Gross per 24 hour  Intake 1335 ml  Output 450 ml  Net 885 ml     Assessment/Plan:  62 y.o. male is s/p Right AKA 1 Day Post-Op   Right AKA dressings intact, stump stocking in place VAC will remain on 1 week Pain control PRN Hgb stable 9.6 post transfusion of 1 unit PT/OT to eval    Graceann Congress, PA-C [facility] [phone] 07/02/2023 6:59 AM  VASCULAR STAFF ADDENDUM: I have independently interviewed and examined the patient. I agree with the above.   Rande Brunt. Lenell Antu, MD [facility]  [facility] of [facility] Phone Number: [phone] 07/02/2023 2:49 PM

## 2023-07-02 NOTE — Evaluation (Signed)
Occupational Therapy Evaluation Patient Details Name: Jose [name] MRN: [id number] DOB: 11-13-1961 Today's Date: 07/02/2023   History of Present Illness The pt is a 62 yo male presenting 8/1 with concern for infection of recent R BKA (on 7/6). 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 PMH includes: alcohol and cocaine abuse, CAD, COPD, DM II, HLD, HTN, and depression.   Clinical Impression   Pt admitted for above dx, PTA pt reports getting in w/c to ambulate and being ind in ADLs. Pt now s/p AKA and presents with decreased LLE strength and impaired balance. He demonstrates [facility] strength in his Ues and performs bridging well in bed. He is CGA for bed mobility but refused OOB mobility. Pt would benefit from continued acute skilled OT services to address deficits and help transition to next level of care. Patient would benefit from post acute skilled rehab facility with <3 hours of therapy and 24/7 support        If plan is discharge home, recommend the following: A little help with bathing/dressing/bathroom;Assistance with cooking/housework;Assist for transportation;Help with stairs or ramp for entrance;A lot of help with walking and/or transfers    Functional Status Assessment  Patient has had a recent decline in their functional status and demonstrates the ability to make significant improvements in function in a reasonable and predictable amount of time.  Equipment Recommendations  None recommended by OT (defer)    Recommendations for Other Services       Precautions / Restrictions Precautions Precautions: Fall Precaution Comments: R AKA to wound vac Restrictions Weight Bearing Restrictions: Yes RLE Weight Bearing: Non weight bearing      Mobility Bed Mobility Overal bed mobility: Needs Assistance Bed Mobility: Supine to Sit, Sit to Supine     Supine to sit: Min assist Sit to supine: Contact guard assist   General bed mobility comments: minA to reach EOB due to  assist needed to scoot, CGA to return to supine    Transfers                   General transfer comment: pt deferred due to pain.      Balance Overall balance assessment: Needs assistance Sitting-balance support: Feet supported, No upper extremity supported Sitting balance-Leahy Scale: Poor Sitting balance - Comments: leaning back with movement of LLE when sitting EOB                                   ADL either performed or assessed with clinical judgement   ADL Overall ADL's : Needs assistance/impaired Eating/Feeding: Modified independent;Sitting   Grooming: Sitting;Contact guard assist   Upper Body Bathing: Sitting;Independent   Lower Body Bathing: Sitting/lateral leans;Minimal assistance   Upper Body Dressing : Sitting;Set up   Lower Body Dressing: Bed level;Set up Lower Body Dressing Details (indicate cue type and reason): Pt reports he bridges to don shorts, simulated bridging               General ADL Comments: Pt declined OOB activity this session     Vision         Perception         Praxis         Pertinent Vitals/Pain Pain Assessment Pain Assessment: Faces Faces Pain Scale: Hurts whole lot Pain Location: R residual limb Pain Descriptors / Indicators: Aching, Discomfort, Grimacing, Guarding, Moaning, Sharp, Throbbing Pain Intervention(s): Limited activity within patient's  tolerance, Monitored during session, Premedicated before session, Repositioned     Extremity/Trunk Assessment Upper Extremity Assessment Upper Extremity Assessment: Overall WFL for tasks assessed   Lower Extremity Assessment Lower Extremity Assessment: RLE deficits/detail;LLE deficits/detail RLE Deficits / Details: AKA, pt too painful to complete ROM evaluation RLE: Unable to fully assess due to pain LLE Deficits / Details: grossly 4/5 to MMT, suspect not powerful enough to complete sit-stand without significant assistance due to loss of RLE LLE  Sensation: WNL LLE Coordination: WNL   Cervical / Trunk Assessment Cervical / Trunk Assessment: Normal   Communication Communication Communication: No apparent difficulties Cueing Techniques: Verbal cues   Cognition Arousal: Alert Behavior During Therapy: Flat affect, WFL for tasks assessed/performed Overall Cognitive Status: Within Functional Limits for tasks assessed                                 General Comments: pt able to answer all questions and follow instructions in session     General Comments  pt reports mild dizziness with transition to sitting that did not last long. pt educated on importance of ROM of RLE for long term prosthetic process, did not look at therapist givin ginstructions    Exercises     Shoulder Instructions      Home Living Family/patient expects to be discharged to:: Skilled nursing facility Living Arrangements: Alone                               Additional Comments: pt from home alone, niece stayed with him after d/c and reports [facility] nurse as well. states no falls, but not planning to d/c home again after this      Prior Functioning/Environment Prior Level of Function : Needs assist             Mobility Comments: pt reports RW use at home, had been transferring independently to [facility] at rehab, had just received his WC at home and only had 1-2 days prior to arrival. reports no falls ADLs Comments: pt reports assist from [facility] RN (may be aide? with ADLs)        OT Problem List: Impaired balance (sitting and/or standing);Pain      OT Treatment/Interventions: Self-care/ADL training;Therapeutic exercise;DME and/or AE instruction;Therapeutic activities;Patient/family education;Balance training    OT Goals(Current goals can be found in the care plan section) Acute Rehab OT Goals Patient Stated Goal: To get better OT Goal Formulation: With patient Time For Goal Achievement: 07/16/23 Potential to Achieve Goals: Good   OT Frequency: Min 1X/week    Co-evaluation PT/OT/SLP Co-Evaluation/Treatment: Yes Reason for Co-Treatment: Other (comment);For patient/therapist safety;To address functional/ADL transfers (limited tolerance due to pain) PT goals addressed during session: Mobility/safety with mobility;Strengthening/ROM OT goals addressed during session: ADL's and self-care;Proper use of Adaptive equipment and DME      AM-PAC OT "6 Clicks" Daily Activity     Outcome Measure Help from another person eating meals?: None Help from another person taking care of personal grooming?: A Little Help from another person toileting, which includes using toliet, bedpan, or urinal?: A Lot Help from another person bathing (including washing, rinsing, drying)?: A Little Help from another person to put on and taking off regular upper body clothing?: None Help from another person to put on and taking off regular lower body clothing?: A Little (bed level) 6 Click Score: 19   End of  Session Nurse Communication: Mobility status  Activity Tolerance: Patient limited by pain Patient left: in bed;with call bell/phone within reach  OT Visit Diagnosis: Unsteadiness on feet (R26.81);Pain;Other abnormalities of gait and mobility (R26.89)                Time: 1002-1020 OT Time Calculation (min): 18 min Charges:  OT General Charges $OT Visit: 1 Visit OT Evaluation $OT Eval Low Complexity: 1 Low  07/02/2023  AB, OTR/L  Acute Rehabilitation Services  Office: [phone]   [name] 07/02/2023, 1:34 PM

## 2023-07-02 NOTE — Inpatient Diabetes Management (Signed)
Inpatient Diabetes Program Recommendations  AACE/ADA: New Consensus Statement on Inpatient Glycemic Control (2015)  Target Ranges:  Prepandial:   less than 140 mg/dL      Peak postprandial:   less than 180 mg/dL (1-2 hours)      Critically ill patients:  140 - 180 mg/dL   Lab Results  Component Value Date   GLUCAP 300 (H) 07/02/2023   HGBA1C 7.7 (H) 04/24/2023    Review of Glycemic Control  Latest Reference Range & Units 07/01/23 09:57 07/01/23 11:38 07/01/23 17:17 07/01/23 20:43 07/02/23 08:50  Glucose-Capillary 70 - 99 mg/dL 295 (H) 621 (H) 308 (H) 306 (H) 300 (H)  (H): Data is abnormally high  Diabetes history: DM2   Outpatient Diabetes medications:  Lantus 30 units every day Novolog 0-9 units TID Noovlog 4 units TID with meals   Current orders for Inpatient glycemic control:  Semglee 30 units every day Novolog 0-9 units TID Novolog 3 units TID  Semglee was held yesterday and never administered after procedure which is likely why glucose is elevated.    Will continue to follow while inpatient.  Thank you, Dulce Sellar, MSN, CDCES Diabetes Coordinator Inpatient Diabetes Program [phone] (team pager from 8a-5p)

## 2023-07-02 NOTE — Progress Notes (Signed)
OT Cancellation Note  Patient Details Name: Jose [name] MRN: [id number] DOB: 04-11-1961   Cancelled Treatment:    Reason Eval/Treat Not Completed: Pain limiting ability to participate (Pt reports high level of pain, requesting pain meds from RN. He anticipates pain to increase following therapy and requests hold until later despite encouragement. OT to follow-up with patient as able.)  07/02/2023  AB, OTR/L  Acute Rehabilitation Services  Office: [phone]   [name] 07/02/2023, 9:48 AM

## 2023-07-02 NOTE — Consult Note (Signed)
   [facility] Inpatient Consult   07/02/2023  Jose [name] 01/16/1961 [id number]  [facility] [[facility]]  Accountable Care Organization [ACO] Patient: [name]  Primary Care Provider:  [name] Is listed for patient's follow up for post hospital needs.  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  Plan: Current disposition unknown but follow up continues to be with [employer] and [employer] plan that is listed to follow.  Will sign off.    For questions, please contact:  Charlesetta Shanks, RN BSN CCM [facility]  [phone] business mobile phone Toll free office [phone]  *Concierge Line  [phone] Fax number: [phone] [email or web address]

## 2023-07-02 NOTE — Evaluation (Signed)
Physical Therapy Evaluation Patient Details Name: Jose [name] MRN: [id number] DOB: Mar 06, 1961 Today's Date: 07/02/2023  History of Present Illness  The pt is a 62 yo male presenting 8/1 with concern for infection of recent R BKA (on 7/6). 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 PMH includes: alcohol and cocaine abuse, CAD, COPD, DM II, HLD, HTN, and depression.   Clinical Impression  Pt in bed upon arrival of PT, agreeable to evaluation at this time. Prior to admission the pt was mobilizing with use of RW, reports he was independent with transfers to and from his [facility] and able to walk short distances with RW at home after d/c from rehab. Reports no falls, assist from [facility] aide for dressing and bathing. The pt was limited this morning due to pain in RLE despite pre-medication with IV pain medicine. He was agreeable to sit EOB only, and needed minA to complete transition due to limited ability to use UE to off-weight for scooting. The pt was able to maintain sitting EOB without assist, but continued to decline offers for OOB or mobility progression. Anticipate will need significant assist for transfers at this time, recommend continued inpatient rehab <3 hrs/day to improve strength in LLE and dynamic stability, pt from home alone and would like to work towards greater independence and safety with mobility prior to return home.         If plan is discharge home, recommend the following: A lot of help with walking and/or transfers;A little help with bathing/dressing/bathroom;Assistance with cooking/housework;Assist for transportation;Help with stairs or ramp for entrance   Can travel by private vehicle   No    Equipment Recommendations None recommended by PT  Recommendations for Other Services       Functional Status Assessment Patient has had a recent decline in their functional status and demonstrates the ability to make significant improvements in function in a reasonable and  predictable amount of time.     Precautions / Restrictions Precautions Precautions: Fall Precaution Comments: R AKA to wound vac Restrictions Weight Bearing Restrictions: Yes RLE Weight Bearing: Non weight bearing      Mobility  Bed Mobility Overal bed mobility: Needs Assistance Bed Mobility: Supine to Sit, Sit to Supine     Supine to sit: Min assist Sit to supine: Contact guard assist   General bed mobility comments: minA to reach EOB due to assist needed to scoot, CGA to return to supine    Transfers                   General transfer comment: pt deferred due to pain.      Balance Overall balance assessment: Needs assistance Sitting-balance support: Feet supported, No upper extremity supported Sitting balance-Leahy Scale: Poor Sitting balance - Comments: leaning back with movement of LLE when sitting EOB                                     Pertinent Vitals/Pain Pain Assessment Pain Assessment: Faces Faces Pain Scale: Hurts whole lot Pain Location: R residual limb Pain Descriptors / Indicators: Aching, Discomfort, Grimacing, Guarding, Moaning, Sharp, Throbbing Pain Intervention(s): Limited activity within patient's tolerance, Monitored during session, Premedicated before session, Repositioned    Home Living Family/patient expects to be discharged to:: Skilled nursing facility Living Arrangements: Alone                 Additional  Comments: pt from home alone, niece stayed with him after d/c and reports [facility] nurse as well. states no falls, but not planning to d/c home again after this    Prior Function Prior Level of Function : Needs assist             Mobility Comments: pt reports RW use at home, had been transferring independently to [facility] at rehab, had just received his WC at home and only had 1-2 days prior to arrival. reports no falls ADLs Comments: pt reports assist from [facility] RN (may be aide? with ADLs)     Extremity/Trunk  Assessment   Upper Extremity Assessment Upper Extremity Assessment: Defer to OT evaluation    Lower Extremity Assessment Lower Extremity Assessment: RLE deficits/detail;LLE deficits/detail RLE Deficits / Details: AKA, pt too painful to complete ROM evaluation RLE: Unable to fully assess due to pain LLE Deficits / Details: grossly 4/5 to MMT, suspect not powerful enough to complete sit-stand without significant assistance due to loss of RLE LLE Sensation: WNL LLE Coordination: WNL    Cervical / Trunk Assessment Cervical / Trunk Assessment: Normal  Communication   Communication Communication: No apparent difficulties Cueing Techniques: Verbal cues  Cognition Arousal: Alert Behavior During Therapy: Flat affect, WFL for tasks assessed/performed Overall Cognitive Status: Within Functional Limits for tasks assessed                                 General Comments: pt able to answer all questions and follow instructions in session        General Comments General comments (skin integrity, edema, etc.): pt reports mild dizziness with transition to sitting that did not last long. pt educated on importance of ROM of RLE for long term prosthetic process, did not look at therapist givin ginstructions    Exercises     Assessment/Plan    PT Assessment Patient needs continued PT services  PT Problem List Decreased range of motion;Decreased strength;Decreased activity tolerance;Decreased balance;Decreased mobility;Decreased knowledge of precautions;Decreased skin integrity;Pain       PT Treatment Interventions DME instruction;Gait training;Functional mobility training;Therapeutic activities;Therapeutic exercise;Balance training;Neuromuscular re-education;Patient/family education;Wheelchair mobility training    PT Goals (Current goals can be found in the Care Plan section)  Acute Rehab PT Goals Patient Stated Goal: to manage pain PT Goal Formulation: With patient Time For  Goal Achievement: 07/16/23 Potential to Achieve Goals: Good    Frequency Min 1X/week     Co-evaluation PT/OT/SLP Co-Evaluation/Treatment: Yes Reason for Co-Treatment: Other (comment);For patient/therapist safety;To address functional/ADL transfers (limited tolerance due to pain) PT goals addressed during session: Mobility/safety with mobility;Strengthening/ROM         AM-PAC PT "6 Clicks" Mobility  Outcome Measure Help needed turning from your back to your side while in a flat bed without using bedrails?: A Little Help needed moving from lying on your back to sitting on the side of a flat bed without using bedrails?: A Little Help needed moving to and from a bed to a chair (including a wheelchair)?: Total Help needed standing up from a chair using your arms (e.g., wheelchair or bedside chair)?: Total Help needed to walk in hospital room?: Total Help needed climbing 3-5 steps with a railing? : Total 6 Click Score: 10    End of Session   Activity Tolerance: Patient limited by pain Patient left: in bed;with call bell/phone within reach;with bed alarm set Nurse Communication: Mobility status PT Visit Diagnosis: Unsteadiness  on feet (R26.81);Muscle weakness (generalized) (M62.81);Pain Pain - Right/Left: Right Pain - part of body: Leg    Time: 1001-1020 PT Time Calculation (min) (ACUTE ONLY): 19 min   Charges:   PT Evaluation $PT Eval Low Complexity: 1 Low   PT General Charges $$ ACUTE PT VISIT: 1 Visit         Vickki Muff, PT, DPT   Acute Rehabilitation Department Office [phone] Secure Chat Communication Preferred  Ronnie Derby 07/02/2023, 11:51 AM

## 2023-07-03 DIAGNOSIS — T8743 Infection of amputation stump, right lower extremity: Secondary | ICD-10-CM | POA: Diagnosis not present

## 2023-07-03 LAB — GLUCOSE, CAPILLARY
Glucose-Capillary: 188 mg/dL — ABNORMAL HIGH (ref 70–99)
Glucose-Capillary: 264 mg/dL — ABNORMAL HIGH (ref 70–99)
Glucose-Capillary: 90 mg/dL (ref 70–99)
Glucose-Capillary: 99 mg/dL (ref 70–99)

## 2023-07-03 MED ORDER — MIRTAZAPINE 15 MG PO TABS
45.0000 mg | ORAL_TABLET | Freq: Every day | ORAL | Status: DC
  Administered 2023-07-03 – 2023-07-08 (×6): 45 mg via ORAL
  Filled 2023-07-03 (×6): qty 3

## 2023-07-03 NOTE — TOC Progression Note (Signed)
Transition of Care [facility]) - Progression Note    Patient Details  Name: Jose [name] MRN: [id number] Date of Birth: 11/02/1961  Transition of [facility]) CM/SW Contact  Carley Hammed, LCSW Phone Number: 07/03/2023, 9:52 AM  Clinical Narrative:    CSW noting recommendation is for SNF at this time. CSW filled out [place] request form and provided clinicals. MD stating pt will likely be ready on Monday. [facility]. TOC will continue to follow.      Barriers to Discharge: Continued Medical Work up  Expected Discharge Plan and Services                         DME Arranged: N/A                     Social Determinants of Health (SDOH) Interventions SDOH Screenings   Food Insecurity: No Food Insecurity (06/26/2023)  Housing: Patient Declined (06/26/2023)  Transportation Needs: No Transportation Needs (06/26/2023)  Utilities: Not At Risk (06/26/2023)  Depression (PHQ2-9): High Risk (08/11/2022)  Social Connections: Unknown (03/05/2023)   [facility] from [facility]  Tobacco Use: Medium Risk (07/01/2023)    Readmission Risk Interventions    05/29/2023    2:21 PM 04/29/2023    1:09 PM 01/29/2022    2:46 PM  Readmission Risk Prevention Plan  Transportation Screening Complete Complete Complete  PCP or Specialist Appt within 3-5 Days   Complete  HRI or Home Care Consult   Complete  Social Work Consult for Recovery Care Planning/Counseling   Complete  Palliative Care Screening   Not Applicable  Medication Review Oceanographer) Complete Complete Complete  PCP or Specialist appointment within 3-5 days of discharge Complete    HRI or Home Care Consult Complete Complete   SW Recovery Care/Counseling Consult Complete Complete   Palliative Care Screening Not Applicable Not Applicable   Skilled Nursing Facility Complete Not Applicable

## 2023-07-03 NOTE — Progress Notes (Signed)
Didn't perform dressing change. Patient has on a wound vac

## 2023-07-03 NOTE — Progress Notes (Signed)
Physical Therapy Treatment Patient Details Name: Jose [name] MRN: [id number] DOB: July 12, 1961 Today's Date: 07/03/2023   History of Present Illness The pt is a 62 yo male presenting 8/1 with concern for infection of recent R BKA (on 7/6). 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 PMH includes: alcohol and cocaine abuse, CAD, COPD, DM II, HLD, HTN, and depression.    PT Comments  Pt is slowly progressing towards goals. Pt currently requires Min A for bed mobility and transfers reliant on UE support to prevent loss of balance. Pt was unable to progress to standing or gait this session due to pain in the R residual limb. Pt lives alone.  Due to pt current functional status, home set up and available assistance at home recommending skilled physical therapy services at a higher level of care and frequency (5x/weekly) on discharge from acute care hospital setting in order to decrease risk for falls, injury, immobility and re-hospitalization. Pt was limited by pain for further mobility today due to pain.    If plan is discharge home, recommend the following: A lot of help with walking and/or transfers;Assistance with cooking/housework;Assist for transportation;Help with stairs or ramp for entrance   Can travel by private vehicle     No  Equipment Recommendations  None recommended by PT    Recommendations for Other Services       Precautions / Restrictions Precautions Precautions: Fall Precaution Comments: R AKA to wound vac Required Braces or Orthoses: Other Brace Other Brace: limb protector Restrictions Weight Bearing Restrictions: Yes RLE Weight Bearing: Non weight bearing     Mobility  Bed Mobility Overal bed mobility: Needs Assistance Bed Mobility: Supine to Sit, Sit to Supine Rolling: Modified independent (Device/Increase time)   Supine to sit: Min assist Sit to supine: Contact guard assist   General bed mobility comments: minA to reach EOB and to get trunk to  midline Patient Response: Cooperative  Transfers Overall transfer level: Needs assistance Equipment used: None Transfers: Bed to chair/wheelchair/BSC       Squat pivot transfers: Min assist     General transfer comment: Pt performed transfer EOB<>BSC at [employer] A    Ambulation/Gait     General Gait Details: Unable to progress this session due to weakness and pain    Tilt Bed Tilt Bed Patient Response: Cooperative  Modified Rankin (Stroke Patients Only)       Balance Overall balance assessment: Needs assistance Sitting-balance support: Feet supported, No upper extremity supported Sitting balance-Leahy Scale: Fair Sitting balance - Comments: no overt LOB   Standing balance support: Bilateral upper extremity supported, During functional activity, Reliant on assistive device for balance Standing balance-Leahy Scale: Poor Standing balance comment: Reliant on UE support        Cognition Arousal: Alert Behavior During Therapy: WFL for tasks assessed/performed Overall Cognitive Status: Within Functional Limits for tasks assessed           General Comments General comments (skin integrity, edema, etc.): Pt reports dizziness with sitting and requires increased time to decrease dizziness sitting EOB.      Pertinent Vitals/Pain Pain Assessment Pain Assessment: Faces Faces Pain Scale: Hurts little more Breathing: normal Negative Vocalization: occasional moan/groan, low speech, negative/disapproving quality Facial Expression: sad, frightened, frown Body Language: relaxed Consolability: no need to console PAINAD Score: 2 Pain Location: R residual limb Pain Descriptors / Indicators: Grimacing, Guarding, Moaning Pain Intervention(s): Monitored during session, Limited activity within patient's tolerance     PT Goals (current goals  can now be found in the care plan section) Acute Rehab PT Goals Patient Stated Goal: to manage pain PT Goal Formulation: With  patient Time For Goal Achievement: 07/16/23 Potential to Achieve Goals: Good Progress towards PT goals: Progressing toward goals    Frequency    Min 1X/week      PT Plan  Continue with current POC       AM-PAC PT "6 Clicks" Mobility   Outcome Measure  Help needed turning from your back to your side while in a flat bed without using bedrails?: None Help needed moving from lying on your back to sitting on the side of a flat bed without using bedrails?: A Little Help needed moving to and from a bed to a chair (including a wheelchair)?: A Little Help needed standing up from a chair using your arms (e.g., wheelchair or bedside chair)?: A Lot Help needed to walk in hospital room?: Total Help needed climbing 3-5 steps with a railing? : Total 6 Click Score: 14    End of Session Equipment Utilized During Treatment: Gait belt;Other (comment) (limb protector) Activity Tolerance: Patient tolerated treatment well;Patient limited by pain Patient left: in bed;with call bell/phone within reach;with bed alarm set Nurse Communication: Mobility status PT Visit Diagnosis: Unsteadiness on feet (R26.81);Muscle weakness (generalized) (M62.81);Pain Pain - Right/Left: Right Pain - part of body: Leg     Time: 8469-6295 PT Time Calculation (min) (ACUTE ONLY): 13 min  Charges:    $Therapeutic Activity: 8-22 mins PT General Charges $$ ACUTE PT VISIT: 1 Visit                     Harrel Carina, DPT, CLT  Acute Rehabilitation Services Office: [phone] (Secure chat preferred)    Jose [name] 07/03/2023, 4:20 PM

## 2023-07-03 NOTE — Progress Notes (Addendum)
  Progress Note    07/03/2023 7:38 AM 2 Days Post-Op  Subjective:  soreness on the anterior aspect of right thigh, says he feels he did well with his therapy sessions yesterday. Concerned that he has no appetite    Vitals:   07/02/23 2026 07/03/23 0428  BP: 133/79 130/80  Pulse: 72 78  Resp:    Temp: 98.4 F (36.9 C) 98.9 F (37.2 C)  SpO2: 99% 98%   Physical Exam: Cardiac:  regular Lungs:  non labored Incisions:  Right AKA VAC dressings in place and stump stocking Neurologic: alert and oriented  CBC    Component Value Date/Time   WBC 7.5 07/02/2023 2321   RBC 3.25 (L) 07/02/2023 2321   HGB 8.4 (L) 07/02/2023 2321   HCT 24.5 (L) 07/02/2023 2321   PLT 352 07/02/2023 2321   MCV 75.4 (L) 07/02/2023 2321   MCH 25.8 (L) 07/02/2023 2321   MCHC 34.3 07/02/2023 2321   RDW 14.8 07/02/2023 2321   LYMPHSABS 1.8 06/26/2023 0344   MONOABS 0.9 06/26/2023 0344   EOSABS 0.2 06/26/2023 0344   BASOSABS 0.0 06/26/2023 0344    BMET    Component Value Date/Time   NA 131 (L) 07/02/2023 2321   NA 131 (L) 12/14/2020 0849   K 3.7 07/02/2023 2321   CL 96 (L) 07/02/2023 2321   CO2 24 07/02/2023 2321   GLUCOSE 181 (H) 07/02/2023 2321   BUN 18 07/02/2023 2321   BUN 26 12/14/2020 0849   CREATININE 1.42 (H) 07/02/2023 2321   CALCIUM 8.8 (L) 07/02/2023 2321   GFRNONAA 55 (L) 07/02/2023 2321   GFRAA 53 (L) 12/14/2020 0849    INR    Component Value Date/Time   INR 1.1 06/25/2023 1757     Intake/Output Summary (Last 24 hours) at 07/03/2023 0738 Last data filed at 07/03/2023 0648 Gross per 24 hour  Intake 645 ml  Output 920 ml  Net -275 ml     Assessment/Plan:  62 y.o. male is s/p right AKA 2 Days Post-Op   Right AKA VAC dressings remain in place. These will stay on until 7/14 Pain control PRN Continue Therapy  PT/OT recommending SNF Vascular will be available over the weekend if any immediate concerns   Graceann Congress, PA-C [facility] [phone] 07/03/2023 7:38 AM  VASCULAR STAFF ADDENDUM: I agree with the above.   Rande Brunt. Lenell Antu, MD [facility] Phone Number: [phone] 07/03/2023 10:51 AM

## 2023-07-03 NOTE — Progress Notes (Signed)
[facility] PROGRESS NOTE  Jose [name] (DOB: 1961/01/22) [id number] PCP: [name] Specialists: Vascular surgery, Dr. Edilia Bo.  Brief Narrative: Jose [name] is a 62 year old African-American male with past medical history of peripheral disease status post right BKA on July 6 thousand 24, history of type 2 diabetes, hypertension, CKD stage IIIb, coronary disease, hyperlipidemia presented to the ER from the vascular surgery office.  Patient states that he has noticed increasing pain and drainage from the right BKA stump.  Patient seen and vascular surgery clinic.  He had evidence of infection at the stump.  Patient sent to the hospital for IV antibiotics and admission. Ultimately underwent AKA 8/7. SNF disposition is pursued.  Subjective: Pain in the bone of right leg at amputation site is severe, constant, waxing/waning, requiring both PO and IV pain medications frequently over past 24 hours. Not eating well, requests something for that, but knows he has to push himself.   Objective: BP (!) 160/68 (BP Location: Right Arm)   Pulse 83   Temp 98.4 F (36.9 C) (Oral)   Resp 16   Ht 5\' 11"  (1.803 m)   Wt 65.8 kg   SpO2 98%   BMI 20.23 kg/m   Gen: No distress Pulm: Clear, nonlabored  CV: RRR, no MRG GI: Soft, NT, ND, +BS  Neuro: Alert and oriented. No new focal deficits. Ext: Warm, R AKA stable Skin: R AKA site dressed, appropriately tender without discharge.   Assessment & Plan: Infection of right BKA stump: Underwent right AKA 8/7 per Dr. Lenell Antu. - Continued abx x1 day postop per [facility]. Remains afebrile, blood cultures negative - Continue pain control with oxycodone favored over prn IV dilaudid. Both doses incrementally increased due to uncontrolled pain and no oversedation. - [facility]. [facility] this morning.   Constipation: This is likely secondary to pain medications. Having BMs. Abd is benign.    -  Continue miralax, senokot, both ordered higher dose and frequency. Abd benign.   Stage IIIb CKD, chronic hyponatremia: Stable Cr at/near baseline. - Monitor urine output.  Avoid nephrotoxic agents. - Continue sodium bicarbonate home med. CO2 on labs normal today.  - Monitor Na level, improving. Continue fluid restriction.   Microcytic anemia: Hemoglobin seems to be between 10-12.  Significant drop in hemoglobin over the past month or so.  Probably due to his infection and recent surgery chronic disease.   Anemia panel reviewed.  Ferritin 1502, iron 8, TIBC 162, percent saturation 5.  Vitamin B12 1057 and folate 19.0.  Elevated ferritin is likely reflective of his acute infection.  - Preop hgb 7.2g/dl > 1u RBCs 8/7, hgb up to 8.4. No ongoing bleeding. Trend H/H postoperatively.   Diabetes mellitus type 2, uncontrolled with hypoglycemia: HbA1c 7.7%.  - Continue basal insulin and SSI     History of BPH: Stable.   CAD: No angina of late. - Continue ASA.   Dyslipidemia - Continue statin.   Essential hypertension - Continue coreg, norvasc   Poor appetite: At least partially attributable to current infection/postoperative state, but pt wishes to treat.  - Increase remeron to 45mg  po qHS.   Tyrone Nine, MD [facility] [email or web address] 07/03/2023, 8:39 AM

## 2023-07-03 NOTE — Plan of Care (Signed)
  Problem: Coping: Goal: Ability to adjust to condition or change in health will improve Outcome: Progressing   Problem: Health Behavior/Discharge Planning: Goal: Ability to identify and utilize available resources and services will improve Outcome: Progressing Goal: Ability to manage health-related needs will improve Outcome: Progressing   

## 2023-07-04 DIAGNOSIS — T8743 Infection of amputation stump, right lower extremity: Secondary | ICD-10-CM | POA: Diagnosis not present

## 2023-07-04 LAB — GLUCOSE, CAPILLARY
Glucose-Capillary: 98 mg/dL (ref 70–99)
Glucose-Capillary: 172 mg/dL — ABNORMAL HIGH (ref 70–99)
Glucose-Capillary: 232 mg/dL — ABNORMAL HIGH (ref 70–99)
Glucose-Capillary: 158 mg/dL — ABNORMAL HIGH (ref 70–99)

## 2023-07-04 MED ORDER — POTASSIUM CHLORIDE CRYS ER 20 MEQ PO TBCR
40.0000 meq | EXTENDED_RELEASE_TABLET | Freq: Once | ORAL | Status: AC
  Administered 2023-07-04: 40 meq via ORAL
  Filled 2023-07-04: qty 2

## 2023-07-04 NOTE — Progress Notes (Signed)
[facility] PROGRESS NOTE  Jose [name] (DOB: 10/31/1961) [id number] PCP: [name] Specialists: Vascular surgery, Dr. Edilia Bo.  Brief Narrative: Jose [name] is a 62 year old African-American male with past medical history of peripheral disease status post right BKA on July 6 thousand 24, history of type 2 diabetes, hypertension, CKD stage IIIb, coronary disease, hyperlipidemia presented to the ER from the vascular surgery office.  Patient states that he has noticed increasing pain and drainage from the right BKA stump.  Patient seen and vascular surgery clinic.  He had evidence of infection at the stump.  Patient sent to the hospital for IV antibiotics and admission. Ultimately underwent AKA 8/7. SNF disposition is pursued.  Subjective: Swelling and pain in the right lower extremity is stable, more painful when elevating. No trouble tolerating diet, just no appetite. No new complaints today.   Objective: BP (!) 160/81 (BP Location: Left Arm)   Pulse 80   Temp 98.9 F (37.2 C) (Oral)   Resp 18   Ht 5\' 11"  (1.803 m)   Wt 66.3 kg   SpO2 98%   BMI 20.39 kg/m   Gen: No distress Pulm: Clear, nonlabored  CV: RRR, no MRG, no LLE edema noted GI: Soft, NT, ND, +BS  Neuro: Alert and oriented. No new focal deficits. Ext: Warm, R AKA stump site dressed, no discharge into prevena  Skin: No spreading proximal erythema, there is some edema to RLE.    Assessment & Plan: Infection of right BKA stump: Underwent right AKA 8/7 per Dr. Lenell Antu. - Continued abx x1 day postop per [facility]. Remains afebrile, blood cultures negative. - Continue pain control with oxycodone favored over prn IV dilaudid. - PT/OT [facility] SNF. [facility] 8[facility]. CSW suspects first we'll hear from them is 8/12.  Constipation: This is likely secondary to pain medications. Improved with benign abdomen.   - Continue miralax, senokot, both ordered higher dose and frequency.     Stage IIIb CKD, chronic hyponatremia: Stable Cr at/near baseline. - Monitor urine output.  Avoid nephrotoxic agents. - Continue sodium bicarbonate home med. CO2 on labs normal today.  - Monitor Na level, improving. Continue fluid restriction.   Microcytic anemia: Hemoglobin seems to be between 10-12.  Significant drop in hemoglobin over the past month or so.  Probably due to his infection and recent surgery chronic disease. Ferritin 1502, iron 8, TIBC 162, percent saturation 5.  - Preop hgb 7.2g/dl > 1u RBCs 8/7, hgb up to 8.4, down to 7.9g/dl today, will recheck in AM. No ongoing bleeding.    Diabetes mellitus type 2, uncontrolled with hypoglycemia: HbA1c 7.7%.  - Continue basal insulin and SSI. Glucose into 90's over past 2 checks, will continue monitoring in case need to deescalate arises.    History of BPH: Stable.   CAD: No angina of late. - Continue ASA.   Dyslipidemia - Continue statin.   Essential hypertension - Continue coreg, norvasc   Hypokalemia:  - Supplement and monitor  Poor appetite: At least partially attributable to current infection/postoperative state, but pt wishes to treat.  - Increase remeron to 45mg  po qHS.   Jose Nine, MD [facility] [email or web address] 07/04/2023, 7:22 AM

## 2023-07-05 DIAGNOSIS — T8743 Infection of amputation stump, right lower extremity: Secondary | ICD-10-CM | POA: Diagnosis not present

## 2023-07-05 LAB — CBC
HCT: 25.1 % — ABNORMAL LOW (ref 39.0–52.0)
Hemoglobin: 8.3 g/dL — ABNORMAL LOW (ref 13.0–17.0)
MCH: 26.2 pg (ref 26.0–34.0)
MCHC: 33.1 g/dL (ref 30.0–36.0)
MCV: 79.2 fL — ABNORMAL LOW (ref 80.0–100.0)
Platelets: 378 10*3/uL (ref 150–400)
RBC: 3.17 MIL/uL — ABNORMAL LOW (ref 4.22–5.81)
RDW: 15.5 % (ref 11.5–15.5)
WBC: 8.2 10*3/uL (ref 4.0–10.5)
nRBC: 0 % (ref 0.0–0.2)

## 2023-07-05 LAB — GLUCOSE, CAPILLARY
Glucose-Capillary: 96 mg/dL (ref 70–99)
Glucose-Capillary: 164 mg/dL — ABNORMAL HIGH (ref 70–99)
Glucose-Capillary: 110 mg/dL — ABNORMAL HIGH (ref 70–99)
Glucose-Capillary: 123 mg/dL — ABNORMAL HIGH (ref 70–99)

## 2023-07-05 LAB — BASIC METABOLIC PANEL
Anion gap: 9 (ref 5–15)
BUN: 16 mg/dL (ref 8–23)
CO2: 23 mmol/L (ref 22–32)
Calcium: 8.9 mg/dL (ref 8.9–10.3)
Chloride: 99 mmol/L (ref 98–111)
Creatinine, Ser: 1.35 mg/dL — ABNORMAL HIGH (ref 0.61–1.24)
GFR, Estimated: 59 mL/min — ABNORMAL LOW (ref >60)
Glucose, Bld: 171 mg/dL — ABNORMAL HIGH (ref 70–99)
Potassium: 3.9 mmol/L (ref 3.5–5.1)
Sodium: 131 mmol/L — ABNORMAL LOW (ref 135–145)

## 2023-07-05 NOTE — Plan of Care (Signed)

## 2023-07-05 NOTE — Progress Notes (Signed)
Progress Note    Jose [name]  UYQ:[id number] DOB: [birth year]  DOA: 06/25/2023 PCP: [name]      Brief Narrative:    Medical records reviewed and are as summarized below:  Jose [name] is a 62 y.o. male with past medical history of peripheral disease status post right BKA on July 6 thousand 24, history of type 2 diabetes, hypertension, CKD stage IIIb, coronary disease, hyperlipidemia presented to the ER from the vascular surgery office. Patient states that he has noticed increasing pain and drainage from the right BKA stump. Patient seen and vascular surgery clinic. He had evidence of infection at the stump. Patient sent to the hospital for IV antibiotics and admission. Ultimately underwent AKA 8/7. SNF disposition is pursued.         Assessment/Plan:   Principal Problem:   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) Active Problems:   S/P BKA (below knee amputation) unilateral, right (HCC) - on 05-30-2023   Hypertension associated with diabetes (HCC)   Chronic hyponatremia - baseline 125-134   Type 2 diabetes mellitus with complication, with long-term current use of insulin (HCC)   Dyslipidemia associated with type 2 diabetes mellitus (HCC)   Chronic kidney disease, stage 3b (HCC)   COPD (chronic obstructive pulmonary disease) (HCC)   CAD (coronary artery disease)   Benign prostatic hyperplasia     Body mass index is 19.62 kg/m.   Infection of right BKA stump: Underwent right AKA 8/7 per Dr. Lenell Antu. - Continued abx x1 day postop per [facility].  No growth on blood cultures. -Continue analgesics as needed for pain. - PT/OT [facility] CSW suspects first we'll hear from them is 8/12.   Constipation: This is likely secondary to pain medications. Continue laxatives as needed..    Stage IIIb CKD, chronic hyponatremia: Stable Cr at/near baseline. - Monitor urine output.  Avoid nephrotoxic agents. - Continue  sodium bicarbonate home med. -Continue fluid restriction.    Microcytic anemia: Hemoglobin seems to be between 10-12.  Significant drop in hemoglobin over the past month or so.  Probably due to his infection and recent surgery, chronic disease. Ferritin 150 9 2, iron 8, TIBC 162, percent saturation 5.  - Preop hgb 7.2g/dl . H&H improved.  S/p transfusion with 1u PRBCs 8/7, H&H is stable.  Diabetes mellitus type 2, uncontrolled with hypoglycemia: HbA1c 7.7%.  - Continue basal insulin and SSI. Glucose levels are optimal on   History of BPH: Stable.   CAD: No angina of late. - Continue ASA.   Dyslipidemia - Continue statin.   Essential hypertension - Continue coreg, norvasc    Hypokalemia:  - Supplement and monitor   Poor appetite: At least partially attributable to current infection/postoperative state.  -Continue Remeron qHS      Diet Order             Diet Carb Modified Fluid consistency: Thin; Room service appropriate? Yes  Diet effective now                            Consultants: Vascular surgeon  Procedures: Right above-knee amputation on 07/01/2023     Medications:    sodium chloride   Intravenous Once   amitriptyline  25 mg Oral QHS   amLODipine  10 mg Oral Daily   aspirin EC  81 mg Oral Daily   atorvastatin  80 mg Oral QPM  brinzolamide  1 drop Both Eyes TID   And   brimonidine  1 drop Both Eyes TID   carvedilol  6.25 mg Oral BID WC   clopidogrel  75 mg Oral Q breakfast   famotidine  20 mg Oral Daily   fluticasone furoate-vilanterol  1 puff Inhalation Daily   gabapentin  100 mg Oral TID   heparin  5,000 Units Subcutaneous Q8H   insulin aspart  0-9 Units Subcutaneous TID WC   insulin aspart  3 Units Subcutaneous TID WC   insulin glargine-yfgn  30 Units Subcutaneous Daily   latanoprost  1 drop Both Eyes QHS   mirtazapine  45 mg Oral QHS   pantoprazole  40 mg Oral Daily   polyethylene glycol  17 g Oral BID   senna-docusate  2  tablet Oral BID   sodium bicarbonate  650 mg Oral BID   Continuous Infusions:   Anti-infectives (From admission, onward)    Start     Dose/Rate Route Frequency Ordered Stop   07/02/23 1800  vancomycin (VANCOCIN) IVPB 1000 mg/200 mL premix        1,000 mg 200 mL/hr over 60 Minutes Intravenous Every 24 hours 07/02/23 1023 07/02/23 1901   07/02/23 1400  piperacillin-tazobactam (ZOSYN) IVPB 3.375 g        3.375 g 12.5 mL/hr over 240 Minutes Intravenous Every 8 hours 07/02/23 1023 07/03/23 0108   06/26/23 1800  vancomycin (VANCOCIN) IVPB 1000 mg/200 mL premix  Status:  Discontinued        1,000 mg 200 mL/hr over 60 Minutes Intravenous Every 24 hours 06/25/23 2223 07/02/23 1023   06/26/23 0600  piperacillin-tazobactam (ZOSYN) IVPB 3.375 g  Status:  Discontinued        3.375 g 12.5 mL/hr over 240 Minutes Intravenous Every 8 hours 06/25/23 2223 07/02/23 1023   06/25/23 1700  ceFEPIme (MAXIPIME) 2 g in sodium chloride 0.9 % 100 mL IVPB        2 g 200 mL/hr over 30 Minutes Intravenous  Once 06/25/23 1654 06/25/23 1944   06/25/23 1700  metroNIDAZOLE (FLAGYL) IVPB 500 mg        500 mg 100 mL/hr over 60 Minutes Intravenous  Once 06/25/23 1654 06/25/23 1923   06/25/23 1700  vancomycin (VANCOCIN) IVPB 1000 mg/200 mL premix  Status:  Discontinued        1,000 mg 200 mL/hr over 60 Minutes Intravenous  Once 06/25/23 1654 06/25/23 1656   06/25/23 1700  vancomycin (VANCOREADY) IVPB 1250 mg/250 mL        1,250 mg 166.7 mL/hr over 90 Minutes Intravenous  Once 06/25/23 1656 06/25/23 1952              Family Communication/Anticipated D/C date and plan/Code Status   DVT prophylaxis: heparin injection 5,000 Units Start: 06/26/23 0045 SCDs Start: 06/26/23 0040     Code Status: Full Code  Family Communication: None Disposition Plan: Plan to discharge to SNF   Status is: Inpatient Remains inpatient appropriate because: Awaiting placement to SNF       Subjective:   Interval events  noted.  He complains of soreness of the right AKA stump   Objective:    Vitals:   07/05/23 0436 07/05/23 0500 07/05/23 0806 07/05/23 0916  BP: 123/78  116/76   Pulse: 72  78 78  Resp: 17  18 18   Temp: 98.4 F (36.9 C)  98 F (36.7 C)   TempSrc: Oral     SpO2: 99%  99%  99%  Weight:  63.8 kg    Height:       No data found.   Intake/Output Summary (Last 24 hours) at 07/05/2023 1313 Last data filed at 07/05/2023 0845 Gross per 24 hour  Intake 240 ml  Output 720 ml  Net -480 ml   Filed Weights   07/03/23 0422 07/04/23 0458 07/05/23 0500  Weight: 65.8 kg 66.3 kg 63.8 kg    Exam:  GEN: NAD SKIN: Warm and dry EYES: No pallor or icterus ENT: MMM CV: RRR PULM: CTA B ABD: soft, ND, NT, +BS CNS: AAO x 3, non focal EXT: Right AKA stump wound connected to wound VAC        Data Reviewed:   I have personally reviewed following labs and imaging studies:  Labs: Labs show the following:   Basic Metabolic Panel: Recent Labs  Lab 06/30/23 0019 07/01/23 0214 07/02/23 0012 07/02/23 2321 07/04/23 0059  NA 127* 129* 129* 131* 130*  K 4.2 3.5 4.3 3.7 3.4*  CL 95* 97* 96* 96* 95*  CO2 22 23 21* 24 25  GLUCOSE 270* 179* 266* 181* 98  BUN 17 16 18 18 18   CREATININE 1.51* 1.44* 1.31* 1.42* 1.50*  CALCIUM 8.9 8.9 9.2 8.8* 8.8*   GFR Estimated Creatinine Clearance: 44.9 mL/min (A) (by C-G formula based on SCr of 1.5 mg/dL (H)). Liver Function Tests: No results for input(s): "AST", "ALT", "ALKPHOS", "BILITOT", "PROT", "ALBUMIN" in the last 168 hours. No results for input(s): "LIPASE", "AMYLASE" in the last 168 hours. No results for input(s): "AMMONIA" in the last 168 hours. Coagulation profile No results for input(s): "INR", "PROTIME" in the last 168 hours.  CBC: Recent Labs  Lab 07/01/23 0214 07/02/23 0012 07/02/23 2321 07/04/23 0059 07/05/23 0024  WBC 6.1 8.8 7.5 7.4 8.2  HGB 7.2* 9.6* 8.4* 7.9* 8.3*  HCT 20.8* 27.2* 24.5* 23.6* 25.1*  MCV 75.4* 75.1*  75.4* 75.9* 79.2*  PLT 303 352 352 320 378   Cardiac Enzymes: No results for input(s): "CKTOTAL", "CKMB", "CKMBINDEX", "TROPONINI" in the last 168 hours. BNP (last 3 results) No results for input(s): "PROBNP" in the last 8760 hours. CBG: Recent Labs  Lab 07/04/23 1201 07/04/23 1717 07/04/23 2138 07/05/23 0807 07/05/23 1201  GLUCAP 172* 232* 158* 96 164*   D-Dimer: No results for input(s): "DDIMER" in the last 72 hours. Hgb A1c: No results for input(s): "HGBA1C" in the last 72 hours. Lipid Profile: No results for input(s): "CHOL", "HDL", "LDLCALC", "TRIG", "CHOLHDL", "LDLDIRECT" in the last 72 hours. Thyroid function studies: No results for input(s): "TSH", "T4TOTAL", "T3FREE", "THYROIDAB" in the last 72 hours.  Invalid input(s): "FREET3" Anemia work up: No results for input(s): "VITAMINB12", "FOLATE", "FERRITIN", "TIBC", "IRON", "RETICCTPCT" in the last 72 hours. Sepsis Labs: Recent Labs  Lab 07/02/23 0012 07/02/23 2321 07/04/23 0059 07/05/23 0024  WBC 8.8 7.5 7.4 8.2    Microbiology Recent Results (from the past 240 hour(s))  Blood Culture (routine x 2)     Status: None   Collection Time: 06/25/23  5:44 PM   Specimen: BLOOD LEFT WRIST  Result Value Ref Range Status   Specimen Description BLOOD LEFT WRIST  Final   Special Requests   Final    BOTTLES DRAWN AEROBIC ONLY Blood Culture results may not be optimal due to an inadequate volume of blood received in culture bottles   Culture   Final    NO GROWTH 5 DAYS Performed at [facility], [address], [place], [place]  [address]    Report Status 06/30/2023 FINAL  Final  Blood Culture (routine x 2)     Status: None   Collection Time: 06/25/23  5:58 PM   Specimen: BLOOD RIGHT HAND  Result Value Ref Range Status   Specimen Description BLOOD RIGHT HAND  Final   Special Requests   Final    BOTTLES DRAWN AEROBIC ONLY Blood Culture adequate volume   Culture   Final    NO GROWTH 5 DAYS Performed at [facility], [address], [place], [place] [address]    Report Status 06/30/2023 FINAL  Final  Surgical pcr screen     Status: None   Collection Time: 07/01/23  7:26 AM   Specimen: Nasal Mucosa; Nasal Swab  Result Value Ref Range Status   MRSA, PCR NEGATIVE NEGATIVE Final   Staphylococcus aureus NEGATIVE NEGATIVE Final    Comment: (NOTE) The Xpert SA Assay (FDA approved for NASAL specimens in patients 67 years of age and older), is one component of a comprehensive surveillance program. It is not intended to diagnose infection nor to guide or monitor treatment. Performed at [facility], [address] [place], [place] [address]     Procedures and diagnostic studies:  No results found.             LOS: 10 days      [facility]   Pager on [email or web address]. If 7PM-7AM, please contact night-coverage at [email or web address]     07/05/2023, 1:13 PM

## 2023-07-05 NOTE — Plan of Care (Signed)
  Problem: Coping: Goal: Ability to adjust to condition or change in health will improve Outcome: Progressing   Problem: Fluid Volume: Goal: Ability to maintain a balanced intake and output will improve Outcome: Progressing   Problem: Health Behavior/Discharge Planning: Goal: Ability to identify and utilize available resources and services will improve Outcome: Progressing   Problem: Health Behavior/Discharge Planning: Goal: Ability to manage health-related needs will improve Outcome: Progressing   Problem: Metabolic: Goal: Ability to maintain appropriate glucose levels will improve Outcome: Progressing   

## 2023-07-06 DIAGNOSIS — N401 Enlarged prostate with lower urinary tract symptoms: Secondary | ICD-10-CM | POA: Diagnosis not present

## 2023-07-06 DIAGNOSIS — T148XXA Other injury of unspecified body region, initial encounter: Secondary | ICD-10-CM | POA: Diagnosis not present

## 2023-07-06 DIAGNOSIS — T8743 Infection of amputation stump, right lower extremity: Secondary | ICD-10-CM | POA: Diagnosis not present

## 2023-07-06 DIAGNOSIS — E118 Type 2 diabetes mellitus with unspecified complications: Secondary | ICD-10-CM | POA: Diagnosis not present

## 2023-07-06 DIAGNOSIS — L089 Local infection of the skin and subcutaneous tissue, unspecified: Secondary | ICD-10-CM

## 2023-07-06 LAB — GLUCOSE, CAPILLARY
Glucose-Capillary: 120 mg/dL — ABNORMAL HIGH (ref 70–99)
Glucose-Capillary: 142 mg/dL — ABNORMAL HIGH (ref 70–99)
Glucose-Capillary: 100 mg/dL — ABNORMAL HIGH (ref 70–99)
Glucose-Capillary: 99 mg/dL (ref 70–99)

## 2023-07-06 NOTE — Progress Notes (Signed)
  Progress Note   Patient: Jose [name] [id number]:[id number] DOB: 03/30/1961 DOA: 06/25/2023     11 DOS: the patient was seen and examined on 07/06/2023   Brief hospital course: Jose [name] is a 62 y.o. male with past medical history of peripheral disease status post right BKA on July 6 thousand 24, history of type 2 diabetes, hypertension, CKD stage IIIb, coronary disease, hyperlipidemia presented to the ER from the vascular surgery office for increasing pain and drainage from the right BKA stump. He had evidence of infection at the stump. Patient sent to the hospital for IV antibiotics and admission. He underwent right AKA 8/7. He is awaiting SNF placement.   Assessment and Plan: * 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) S/p right AKA 07/01/23. Continue pain control. PT/ OT follow up.  TOC working on SNF placement.  Stage IIIb CKD, chronic hyponatremia: Stable Cr. Monitor urine output.  Avoid nephrotoxic agents. Continue sodium bicarbonate home med. Continue fluid restriction  Dyslipidemia associated with type 2 diabetes mellitus (HCC) Continue with lipitor 80 mg.  Type 2 diabetes mellitus with complication, with long-term current use of insulin (HCC) Continue with lantus and SSI. Hypoglycemia protocol.  Chronic hyponatremia - baseline 125-134 Chronic, stable. Sodium 131.  Benign prostatic hyperplasia Stable.  CAD (coronary artery disease) Continue with ASA.  COPD (chronic obstructive pulmonary disease) (HCC) Stable. Continue with inhalers.  Microcytic anemia-  Due to chronic illness, infection. Stable Hb around 8. No active bleeding.      Subjective: Patient is seen and examined today morning. He has pian at AKA site. Did work with PT. No other complaints.  Physical Exam: Vitals:   07/06/23 0500 07/06/23 0815 07/06/23 0854 07/06/23 1153  BP:  (!) 146/82    Pulse:  91    Resp:  16    Temp:  98.3 F (36.8 C)  98.9 F (37.2 C)  TempSrc:  Oral  Oral   SpO2:  97% 96%   Weight: 68.6 kg     Height:       General - Elderly African-American male, distressed due to pain HEENT - PERRLA, EOMI, atraumatic head, non tender sinuses. Lung - Clear, rales, rhonchi, wheezes. Heart - S1, S2 heard, no murmurs, rubs, no pedal edema Neuro - Alert, awake and oriented x 3, non focal exam. Skin - Warm and dry.  Right AKA with shrinker. Data Reviewed:  There are no new results to review at this time.  Family Communication: Patient understands and agrees with above care plan.  Disposition: Status is: Inpatient Remains inpatient appropriate because: SNF placement.  Planned Discharge Destination: Skilled nursing facility    Time spent: 43 minutes  Author: Marcelino Duster, MD 07/06/2023 1:17 PM  For on call review [email or web address].

## 2023-07-06 NOTE — Progress Notes (Signed)
  Progress Note    07/06/2023 7:33 AM 5 Days Post-Op  Subjective:  no major complaints. Says he is doing much better with pain control   Vitals:   07/05/23 2055 07/06/23 0456  BP: 132/65 136/80  Pulse: 72 67  Resp:    Temp: 98.2 F (36.8 C) 98 F (36.7 C)  SpO2: 100% 99%   Physical Exam: Cardiac:  regular Lungs:  non labored Incisions:  Right AKA VAC dressings in place and stump stocking Abdomen:  soft Neurologic: alert and oriented  CBC    Component Value Date/Time   WBC 8.2 07/05/2023 0024   RBC 3.17 (L) 07/05/2023 0024   HGB 8.3 (L) 07/05/2023 0024   HCT 25.1 (L) 07/05/2023 0024   PLT 378 07/05/2023 0024   MCV 79.2 (L) 07/05/2023 0024   MCH 26.2 07/05/2023 0024   MCHC 33.1 07/05/2023 0024   RDW 15.5 07/05/2023 0024   LYMPHSABS 1.8 06/26/2023 0344   MONOABS 0.9 06/26/2023 0344   EOSABS 0.2 06/26/2023 0344   BASOSABS 0.0 06/26/2023 0344    BMET    Component Value Date/Time   NA 131 (L) 07/05/2023 1218   NA 131 (L) 12/14/2020 0849   K 3.9 07/05/2023 1218   CL 99 07/05/2023 1218   CO2 23 07/05/2023 1218   GLUCOSE 171 (H) 07/05/2023 1218   BUN 16 07/05/2023 1218   BUN 26 12/14/2020 0849   CREATININE 1.35 (H) 07/05/2023 1218   CALCIUM 8.9 07/05/2023 1218   GFRNONAA 59 (L) 07/05/2023 1218   GFRAA 53 (L) 12/14/2020 0849    INR    Component Value Date/Time   INR 1.1 06/25/2023 1757     Intake/Output Summary (Last 24 hours) at 07/06/2023 0733 Last data filed at 07/05/2023 0845 Gross per 24 hour  Intake --  Output 200 ml  Net -200 ml     Assessment/Plan:  62 y.o. male is s/p right AKA 5 Days Post-Op   Pain improving Right AKA dressings intact and stump stocking. Will take off 7/14 Continue therapies   [facility] [phone] 07/06/2023 7:33 AM

## 2023-07-06 NOTE — TOC Progression Note (Signed)
Transition of Care [facility]) - Progression Note    Patient Details  Name: Jose [name] MRN: [id number] Date of Birth: 23-Jul-1961  [facility] CM/SW Contact  Carley Hammed, LCSW Phone Number: 07/06/2023, 1:03 PM  Clinical Narrative:    CSW has not received an update from the [place] on placement. [facility] for [employer], Manufacturing engineer, VM left. CSW faxed over updated clinicals and request for placement. TOC will continue to follow for DC needs.      Barriers to Discharge: Continued Medical Work up  Expected Discharge Plan and Services                         DME Arranged: N/A                     Social Determinants of Health (SDOH) Interventions SDOH Screenings   Food Insecurity: No Food Insecurity (06/26/2023)  Housing: Patient Declined (06/26/2023)  Transportation Needs: No Transportation Needs (06/26/2023)  Utilities: Not At Risk (06/26/2023)  Depression (PHQ2-9): High Risk (08/11/2022)  Social Connections: Unknown (03/05/2023)   [facility]  Tobacco Use: Medium Risk (07/01/2023)    Readmission Risk Interventions    05/29/2023    2:21 PM 04/29/2023    1:09 PM 01/29/2022    2:46 PM  Readmission Risk Prevention Plan  Transportation Screening Complete Complete Complete  PCP or Specialist Appt within 3-5 Days   Complete  HRI or Home Care Consult   Complete  Social Work Consult for Recovery Care Planning/Counseling   Complete  Palliative Care Screening   Not Applicable  Medication Review Oceanographer) Complete Complete Complete  PCP or Specialist appointment within 3-5 days of discharge Complete    HRI or Home Care Consult Complete Complete   SW Recovery Care/Counseling Consult Complete Complete   Palliative Care Screening Not Applicable Not Applicable   Skilled Nursing Facility Complete Not Applicable

## 2023-07-06 NOTE — Plan of Care (Signed)

## 2023-07-06 NOTE — Progress Notes (Signed)
Occupational Therapy Treatment Patient Details Name: Jose [name] MRN: [id number] DOB: 1960/12/19 Today's Date: 07/06/2023   History of present illness The pt is a 62 yo male presenting 8/1 with concern for infection of recent R BKA (on 7/6). 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 PMH includes: alcohol and cocaine abuse, CAD, COPD, DM II, HLD, HTN, and depression.   OT comments  Session focus on goal establishment and promoting increased independence to complete ADL and IADL tasks. Patient up in chair upon arrival, with education provided on chair level exercises to increase independence. Patient set up for lower body dressing when seated, but declining sit<>stands or further exercises as he had recently gotten up to the chair. Education provided to niece to promote increased carry over and independence, with acknowledgement noted. Discharge remains appropriate, OT will continue to follow.       If plan is discharge home, recommend the following:  A little help with bathing/dressing/bathroom;Assistance with cooking/housework;Assist for transportation;Help with stairs or ramp for entrance;A lot of help with walking and/or transfers   Equipment Recommendations  None recommended by OT (defer to next venue)    Recommendations for Other Services      Precautions / Restrictions Precautions Precautions: Fall Precaution Comments: R AKA to wound vac Required Braces or Orthoses: Other Brace Other Brace: limb protector       Mobility Bed Mobility               General bed mobility comments: patient up in chair upon arrival    Transfers                   General transfer comment: declining return to bed     Balance                                           ADL either performed or assessed with clinical judgement   ADL Overall ADL's : Needs assistance/impaired             Lower Body Bathing: Set up;Sit to/from stand       Lower  Body Dressing: Set up;Sit to/from stand Lower Body Dressing Details (indicate cue type and reason): donning and doffing sock             Functional mobility during ADLs: Minimal assistance General ADL Comments: Session focus on goal establishment and promoting increased independence to complete ADL and IADL tasks.    Extremity/Trunk Assessment Upper Extremity Assessment Upper Extremity Assessment: Overall WFL for tasks assessed            Vision   Vision Assessment?: No apparent visual deficits   Perception Perception Perception: Not tested   Praxis Praxis Praxis: Not tested    Cognition Arousal: Alert Behavior During Therapy: WFL for tasks assessed/performed Overall Cognitive Status: Within Functional Limits for tasks assessed                                          Exercises      Shoulder Instructions       General Comments      Pertinent Vitals/ Pain       Pain Assessment Pain Assessment: No/denies pain  Home Living  Prior Functioning/Environment              Frequency  Min 1X/week        Progress Toward Goals  OT Goals(current goals can now be found in the care plan section)  Progress towards OT goals: Progressing toward goals  Acute Rehab OT Goals Patient Stated Goal: to get back to rehab so I can be independent OT Goal Formulation: With patient Time For Goal Achievement: 07/16/23 Potential to Achieve Goals: Good ADL Goals Pt Will Perform Lower Body Bathing: Independently;sitting/lateral leans;sit to/from stand Pt Will Perform Lower Body Dressing: Independently;sit to/from stand;sitting/lateral leans Pt Will Transfer to Toilet: stand pivot transfer;bedside commode Pt Will Perform Toileting - Clothing Manipulation and hygiene: with modified independence;sitting/lateral leans;sit to/from stand Additional ADL Goal #1: Patient will be able to verbalize 3  strategies for energy conservation in order to promote increased independence at discharge.  Plan Discharge plan remains appropriate;Frequency remains appropriate    Co-evaluation                 AM-PAC OT "6 Clicks" Daily Activity     Outcome Measure   Help from another person eating meals?: None Help from another person taking care of personal grooming?: A Little Help from another person toileting, which includes using toliet, bedpan, or urinal?: A Little Help from another person bathing (including washing, rinsing, drying)?: A Little Help from another person to put on and taking off regular upper body clothing?: None Help from another person to put on and taking off regular lower body clothing?: A Little 6 Click Score: 20    End of Session    OT Visit Diagnosis: Unsteadiness on feet (R26.81);Pain;Other abnormalities of gait and mobility (R26.89)   Activity Tolerance Patient tolerated treatment well   Patient Left in chair;with call bell/phone within reach;with family/visitor present   Nurse Communication Mobility status        Time: 1425-1435 OT Time Calculation (min): 10 min  Charges: OT General Charges $OT Visit: 1 Visit OT Treatments $Self Care/Home Management : 8-22 mins  Pollyann Glen E. , OTR/L Acute Rehabilitation Services [phone]   Cherlyn Cushing 07/06/2023, 3:33 PM

## 2023-07-06 NOTE — Progress Notes (Signed)
Physical Therapy Treatment Patient Details Name: Jose [name] MRN: [id number] DOB: 06/01/1961 Today's Date: 07/06/2023   History of Present Illness The pt is a 62 yo male presenting 8/1 with concern for infection of recent R BKA (on 7/6). 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 PMH includes: alcohol and cocaine abuse, CAD, COPD, DM II, HLD, HTN, and depression.    PT Comments  The pt was agreeable to session, reports pain better controlled over weekend. Pt educated in exercises for hip strengthening and ROM, educated how to complete appropriately and safely in bed and demos good technique. The pt was then able to complete good sit-stand transfer with use of RW and minA, and demos safe hop on LLE to step to chair with minA and use of RW. Pt unable to complete progression of gait due to RLE pain when in dependent position, but is making good progress with initial transfers. Continue to recommend inpatient rehab <3hrs/day when medically stable to d/c.     If plan is discharge home, recommend the following: A lot of help with walking and/or transfers;Assistance with cooking/housework;Assist for transportation;Help with stairs or ramp for entrance   Can travel by private vehicle     No  Equipment Recommendations  None recommended by PT    Recommendations for Other Services       Precautions / Restrictions Precautions Precautions: Fall Precaution Comments: R AKA to wound vac Required Braces or Orthoses: Other Brace Other Brace: limb protector Restrictions Weight Bearing Restrictions: Yes RLE Weight Bearing: Non weight bearing     Mobility  Bed Mobility Overal bed mobility: Needs Assistance Bed Mobility: Rolling, Supine to Sit Rolling: Supervision   Supine to sit: Min assist, Used rails     General bed mobility comments: minA to complete and minA to scoot, pt using bed rails effectively    Transfers Overall transfer level: Needs assistance Equipment used: Rolling  walker (2 wheels) Transfers: Sit to/from Stand, Bed to chair/wheelchair/BSC Sit to Stand: Min assist   Step pivot transfers: Min assist       General transfer comment: pt able to rise to standing x4 in session, good hop to L with use of RE to step to recliner. minA to steady but no overt LOB    Ambulation/Gait               General Gait Details: limited to single hop with RW for pivot to chair due to pain in RLE with upright.      Balance Overall balance assessment: Needs assistance Sitting-balance support: Feet supported, No upper extremity supported Sitting balance-Leahy Scale: Good Sitting balance - Comments: no overt LOB   Standing balance support: Bilateral upper extremity supported, During functional activity, Reliant on assistive device for balance Standing balance-Leahy Scale: Poor Standing balance comment: Reliant on UE support                            Cognition Arousal: Alert Behavior During Therapy: WFL for tasks assessed/performed Overall Cognitive Status: Within Functional Limits for tasks assessed                                          Exercises Amputee Exercises Hip Extension: AAROM, Right, 10 reps, Sidelying Hip ABduction/ADduction: AROM, Right, 15 reps, Sidelying    General Comments General comments (skin integrity,  edema, etc.): VSS on RA      Pertinent Vitals/Pain Pain Assessment Pain Assessment: 0-10 Pain Score: 5  Pain Location: R residual limb Pain Descriptors / Indicators: Grimacing, Guarding, Moaning Pain Intervention(s): Limited activity within patient's tolerance, Monitored during session, Repositioned     PT Goals (current goals can now be found in the care plan section) Acute Rehab PT Goals Patient Stated Goal: to manage pain PT Goal Formulation: With patient Time For Goal Achievement: 07/16/23 Potential to Achieve Goals: Good Progress towards PT goals: Progressing toward goals     Frequency    Min 1X/week      PT Plan Current plan remains appropriate       AM-PAC PT "6 Clicks" Mobility   Outcome Measure  Help needed turning from your back to your side while in a flat bed without using bedrails?: None Help needed moving from lying on your back to sitting on the side of a flat bed without using bedrails?: A Little Help needed moving to and from a bed to a chair (including a wheelchair)?: A Little Help needed standing up from a chair using your arms (e.g., wheelchair or bedside chair)?: A Lot Help needed to walk in hospital room?: Total Help needed climbing 3-5 steps with a railing? : Total 6 Click Score: 14    End of Session Equipment Utilized During Treatment: Gait belt Activity Tolerance: Patient tolerated treatment well Patient left: in chair;with call bell/phone within reach;with chair alarm set Nurse Communication: Mobility status PT Visit Diagnosis: Unsteadiness on feet (R26.81);Muscle weakness (generalized) (M62.81);Pain Pain - Right/Left: Right Pain - part of body: Leg     Time: 1610-9604 PT Time Calculation (min) (ACUTE ONLY): 27 min  Charges:    $Gait Training: 8-22 mins $Therapeutic Exercise: 8-22 mins PT General Charges $$ ACUTE PT VISIT: 1 Visit                     Vickki Muff, PT, DPT   Acute Rehabilitation Department Office [phone] Secure Chat Communication Preferred   Jose [name] 07/06/2023, 3:55 PM

## 2023-07-07 DIAGNOSIS — N401 Enlarged prostate with lower urinary tract symptoms: Secondary | ICD-10-CM | POA: Diagnosis not present

## 2023-07-07 DIAGNOSIS — T148XXA Other injury of unspecified body region, initial encounter: Secondary | ICD-10-CM | POA: Diagnosis not present

## 2023-07-07 DIAGNOSIS — E118 Type 2 diabetes mellitus with unspecified complications: Secondary | ICD-10-CM | POA: Diagnosis not present

## 2023-07-07 DIAGNOSIS — T8743 Infection of amputation stump, right lower extremity: Secondary | ICD-10-CM | POA: Diagnosis not present

## 2023-07-07 LAB — GLUCOSE, CAPILLARY
Glucose-Capillary: 86 mg/dL (ref 70–99)
Glucose-Capillary: 151 mg/dL — ABNORMAL HIGH (ref 70–99)
Glucose-Capillary: 82 mg/dL (ref 70–99)
Glucose-Capillary: 136 mg/dL — ABNORMAL HIGH (ref 70–99)

## 2023-07-07 MED ORDER — INSULIN GLARGINE-YFGN 100 UNIT/ML ~~LOC~~ SOLN
25.0000 [IU] | Freq: Every day | SUBCUTANEOUS | Status: DC
  Administered 2023-07-07 – 2023-07-09 (×3): 25 [IU] via SUBCUTANEOUS
  Filled 2023-07-07 (×3): qty 0.25

## 2023-07-07 NOTE — Plan of Care (Signed)
Patient is AOX4. Able to call appropriately. Pain well managed with PRN medication. Wound vac in place. No drainage noted. RA. VSS. Awaiting placement.   Problem: Education: Goal: Ability to describe self-care measures that may prevent or decrease complications (Diabetes Survival Skills Education) will improve Outcome: Progressing Goal: Individualized Educational Video(s) Outcome: Progressing   Problem: Coping: Goal: Ability to adjust to condition or change in health will improve Outcome: Progressing   Problem: Fluid Volume: Goal: Ability to maintain a balanced intake and output will improve Outcome: Progressing   Problem: Health Behavior/Discharge Planning: Goal: Ability to identify and utilize available resources and services will improve Outcome: Progressing Goal: Ability to manage health-related needs will improve Outcome: Progressing   Problem: Metabolic: Goal: Ability to maintain appropriate glucose levels will improve Outcome: Progressing   Problem: Nutritional: Goal: Maintenance of adequate nutrition will improve Outcome: Progressing Goal: Progress toward achieving an optimal weight will improve Outcome: Progressing   Problem: Skin Integrity: Goal: Risk for impaired skin integrity will decrease Outcome: Progressing   Problem: Tissue Perfusion: Goal: Adequacy of tissue perfusion will improve Outcome: Progressing   Problem: Education: Goal: Knowledge of General Education information will improve Description: Including pain rating scale, medication(s)/side effects and non-pharmacologic comfort measures Outcome: Progressing   Problem: Health Behavior/Discharge Planning: Goal: Ability to manage health-related needs will improve Outcome: Progressing   Problem: Clinical Measurements: Goal: Ability to maintain clinical measurements within normal limits will improve Outcome: Progressing Goal: Will remain free from infection Outcome: Progressing Goal: Diagnostic  test results will improve Outcome: Progressing Goal: Respiratory complications will improve Outcome: Progressing Goal: Cardiovascular complication will be avoided Outcome: Progressing   Problem: Activity: Goal: Risk for activity intolerance will decrease Outcome: Progressing   Problem: Nutrition: Goal: Adequate nutrition will be maintained Outcome: Progressing   Problem: Coping: Goal: Level of anxiety will decrease Outcome: Progressing   Problem: Elimination: Goal: Will not experience complications related to bowel motility Outcome: Progressing Goal: Will not experience complications related to urinary retention Outcome: Progressing   Problem: Pain Managment: Goal: General experience of comfort will improve Outcome: Progressing   Problem: Safety: Goal: Ability to remain free from injury will improve Outcome: Progressing   Problem: Skin Integrity: Goal: Risk for impaired skin integrity will decrease Outcome: Progressing

## 2023-07-07 NOTE — TOC Progression Note (Addendum)
Transition of Care [facility]) - Progression Note    Patient Details  Name: Jose [name] MRN: [id number] Date of Birth: 1961/09/24  [facility]) CM/SW Contact  Carley Hammed, LCSW Phone Number: 07/07/2023, 12:02 PM  Clinical Narrative:    CSW met with pt at bedside as he had questions about DC. CSW advised him that the documentation has been sent many times and VM left as well. No response from the [place] yet. Pt is frustrated by the process, but understands there is delays with the [place]. CSW called case manager yesterday and this morning, will attempt again this afternoon. TOC will continue to follow for DC needs.   1:30 CSW spoke with Vernona Rieger (Ext [phone]) who provided the info for [facility] again. CSW advised that 3 VM had already been left. Vernona Rieger states she will follow up with the team and call back. TOC will continue to follow.   3:50 CSW received a call back from Human resources officer at the [facility]. She confirms that they have received the documentation and request. She also confirms that the MD plans to bring pt back to [facility], but has not confirmed this. Vernona Rieger states maybe Thursday. CSW updated pt, TOC will continue to follow.     Barriers to Discharge: Continued Medical Work up  Expected Discharge Plan and Services                         DME Arranged: N/A                     Social Determinants of Health (SDOH) Interventions SDOH Screenings   Food Insecurity: No Food Insecurity (06/26/2023)  Housing: Patient Declined (06/26/2023)  Transportation Needs: No Transportation Needs (06/26/2023)  Utilities: Not At Risk (06/26/2023)  Depression (PHQ2-9): High Risk (08/11/2022)  Social Connections: Unknown (03/05/2023)   Received from [facility], [facility]  Tobacco Use: Medium Risk (07/01/2023)    Readmission Risk Interventions    05/29/2023    2:21 PM 04/29/2023    1:09 PM 01/29/2022    2:46 PM  Readmission Risk Prevention Plan  Transportation Screening  Complete Complete Complete  PCP or Specialist Appt within 3-5 Days   Complete  HRI or Home Care Consult   Complete  Social Work Consult for Recovery Care Planning/Counseling   Complete  Palliative Care Screening   Not Applicable  Medication Review Oceanographer) Complete Complete Complete  PCP or Specialist appointment within 3-5 days of discharge Complete    HRI or Home Care Consult Complete Complete   SW Recovery Care/Counseling Consult Complete Complete   Palliative Care Screening Not Applicable Not Applicable   Skilled Nursing Facility Complete Not Applicable

## 2023-07-07 NOTE — Plan of Care (Signed)

## 2023-07-07 NOTE — Progress Notes (Signed)
  Progress Note   Patient: Jose [name] WUJ:[id number] DOB: [birth year] DOA: 06/25/2023     12 DOS: the patient was seen and examined on 07/07/2023   Brief hospital course: Jose [name] is a 62 y.o. male with past medical history of peripheral disease status post right BKA on July 6 thousand 24, history of type 2 diabetes, hypertension, CKD stage IIIb, coronary disease, hyperlipidemia presented to the ER from the vascular surgery office for increasing pain and drainage from the right BKA stump. He had evidence of infection at the stump. Patient sent to the hospital for IV antibiotics and admission. He underwent right AKA 8/7. He is awaiting SNF placement.   Assessment and Plan: * 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) S/p right AKA 07/01/23. Continue pain control. Encouraged patient to work with PT, out of bed. TOC working on SNF placement.  Stage IIIb CKD, chronic hyponatremia: Stable Cr. Monitor urine output.  Avoid nephrotoxic agents. Continue sodium bicarbonate home med. Continue fluid restriction  Dyslipidemia associated with type 2 diabetes mellitus (HCC) Continue with lipitor.  Type 2 diabetes mellitus with complication, with long-term current use of insulin (HCC) Hypoglycemia Decreased lantus to 25u lantus Continue accucheks, sliding scale. Stopped premeal insulin. Hypoglycemia protocol.  Chronic hyponatremia - baseline 125-134 Chronic, stable. Sodium 131.  Benign prostatic hyperplasia Stable.  CAD (coronary artery disease) Continue with ASA, statin.  COPD (chronic obstructive pulmonary disease) (HCC) Stable. Continue with inhalers.  Microcytic anemia-  Due to chronic illness, infection. Stable Hb around 8. No active bleeding.      Subjective: Patient is seen and examined today morning. Pain better today, Did work with PT. Advised out of bed to chair. Blood sugars lower side noted.  Physical Exam: Vitals:   07/07/23 0500 07/07/23 0759  07/07/23 1723 07/07/23 2126  BP:  125/74 113/65 133/74  Pulse:  71 66 73  Resp:  17 18   Temp:  98.5 F (36.9 C) 98.5 F (36.9 C) 98.1 F (36.7 C)  TempSrc:  Oral  Oral  SpO2:  98% 100% 100%  Weight: 68.7 kg     Height:       General - Elderly African-American male, no acute distress HEENT - PERRLA, EOMI, atraumatic head, non tender sinuses. Lung - Clear, rales, rhonchi, wheezes. Heart - S1, S2 heard, no murmurs, rubs, no pedal edema Neuro - Alert, awake and oriented x 3, non focal exam. Skin - Warm and dry.  Right AKA with shrinker. Data Reviewed:  There are no new results to review at this time.  Family Communication: Patient understands and agrees with above care plan.  Disposition: Status is: Inpatient Remains inpatient appropriate because: SNF placement.  Planned Discharge Destination: Skilled nursing facility    Time spent: 43 minutes  Author: Marcelino Duster, MD 07/07/2023 9:57 PM  For on call review [email or web address].

## 2023-07-08 DIAGNOSIS — E118 Type 2 diabetes mellitus with unspecified complications: Secondary | ICD-10-CM | POA: Diagnosis not present

## 2023-07-08 DIAGNOSIS — T8743 Infection of amputation stump, right lower extremity: Secondary | ICD-10-CM | POA: Diagnosis not present

## 2023-07-08 DIAGNOSIS — L089 Local infection of the skin and subcutaneous tissue, unspecified: Secondary | ICD-10-CM

## 2023-07-08 DIAGNOSIS — T148XXA Other injury of unspecified body region, initial encounter: Secondary | ICD-10-CM | POA: Diagnosis not present

## 2023-07-08 DIAGNOSIS — N401 Enlarged prostate with lower urinary tract symptoms: Secondary | ICD-10-CM | POA: Diagnosis not present

## 2023-07-08 LAB — GLUCOSE, CAPILLARY
Glucose-Capillary: 122 mg/dL — ABNORMAL HIGH (ref 70–99)
Glucose-Capillary: 129 mg/dL — ABNORMAL HIGH (ref 70–99)
Glucose-Capillary: 153 mg/dL — ABNORMAL HIGH (ref 70–99)
Glucose-Capillary: 159 mg/dL — ABNORMAL HIGH (ref 70–99)
Glucose-Capillary: 68 mg/dL — ABNORMAL LOW (ref 70–99)

## 2023-07-08 NOTE — TOC Progression Note (Signed)
Transition of Care [facility]) - Progression Note    Patient Details  Name: Jose [name] MRN: [id number] Date of Birth: Jun 10, 1961  Transition [facility] Care [facility]) CM/SW Contact  Carley Hammed, LCSW Phone Number: 07/08/2023, 11:31 AM  Clinical Narrative:     CSW spoke with Lurena Joiner at the [place] who confirmed they will accept pt back tomorrow. They will arrange transportation between 8- 830 am. Pt and medical team notified. They request pt come with copies of all DC paperwork and discharge med list.  Report # is [phone] ext [phone].     Barriers to Discharge: Continued Medical Work up  Expected Discharge Plan and Services                         DME Arranged: N/A                     Social Determinants of Health (SDOH) Interventions SDOH Screenings   Food Insecurity: No Food Insecurity (06/26/2023)  Housing: Patient Declined (06/26/2023)  Transportation Needs: No Transportation Needs (06/26/2023)  Utilities: Not At Risk (06/26/2023)  Depression (PHQ2-9): High Risk (08/11/2022)  Social Connections: Unknown (03/05/2023)   Received [facility], [facility]  Tobacco Use: Medium Risk (07/01/2023)    Readmission Risk Interventions    05/29/2023    2:21 PM 04/29/2023    1:09 PM 01/29/2022    2:46 PM  Readmission Risk Prevention Plan  Transportation Screening Complete Complete Complete  PCP or Specialist Appt within 3-5 Days   Complete  HRI or Home Care Consult   Complete  Social Work Consult for Recovery Care Planning/Counseling   Complete  Palliative Care Screening   Not Applicable  Medication Review Oceanographer) Complete Complete Complete  PCP or Specialist appointment within 3-5 days of discharge Complete    HRI or Home Care Consult Complete Complete   SW Recovery Care/Counseling Consult Complete Complete   Palliative Care Screening Not Applicable Not Applicable   Skilled Nursing Facility Complete Not Applicable

## 2023-07-08 NOTE — Progress Notes (Signed)
  Progress Note    07/08/2023 8:41 AM 7 Days Post-Op  Subjective:  no major complaints. He tells me is is possibly discharging tomorrow   Vitals:   07/08/23 0452 07/08/23 0718  BP: (!) 141/76 119/74  Pulse: 64 68  Resp:  16  Temp: 97.8 F (36.6 C) 98 F (36.7 C)  SpO2: 100% 100%   Physical Exam: Cardiac:  regular Lungs:  non labored Incisions:  Right AKA Prevena VAC removed. Stump looks great, viable flaps, no bleeding or drainage  Neurologic: alert and oriented  CBC    Component Value Date/Time   WBC 8.2 07/05/2023 0024   RBC 3.17 (L) 07/05/2023 0024   HGB 8.3 (L) 07/05/2023 0024   HCT 25.1 (L) 07/05/2023 0024   PLT 378 07/05/2023 0024   MCV 79.2 (L) 07/05/2023 0024   MCH 26.2 07/05/2023 0024   MCHC 33.1 07/05/2023 0024   RDW 15.5 07/05/2023 0024   LYMPHSABS 1.8 06/26/2023 0344   MONOABS 0.9 06/26/2023 0344   EOSABS 0.2 06/26/2023 0344   BASOSABS 0.0 06/26/2023 0344    BMET    Component Value Date/Time   NA 131 (L) 07/05/2023 1218   NA 131 (L) 12/14/2020 0849   K 3.9 07/05/2023 1218   CL 99 07/05/2023 1218   CO2 23 07/05/2023 1218   GLUCOSE 171 (H) 07/05/2023 1218   BUN 16 07/05/2023 1218   BUN 26 12/14/2020 0849   CREATININE 1.35 (H) 07/05/2023 1218   CALCIUM 8.9 07/05/2023 1218   GFRNONAA 59 (L) 07/05/2023 1218   GFRAA 53 (L) 12/14/2020 0849    INR    Component Value Date/Time   INR 1.1 06/25/2023 1757     Intake/Output Summary (Last 24 hours) at 07/08/2023 0841 Last data filed at 07/08/2023 0840 Gross per 24 hour  Intake 340 ml  Output 1070 ml  Net -730 ml     Assessment/Plan:  62 y.o. male is s/p right AKA 7 Days Post-Op   Right AKA dressings removed today. Viable flaps, looks great 4x4s, Ace and stump stocking reapplied Start daily dressing changes Anticipating d/c to SNF tomorrow F/u sent for staple removal in our Office in 4 weeks   Graceann Congress, [facility] [phone] 07/08/2023 8:41 AM

## 2023-07-08 NOTE — Plan of Care (Signed)
  Problem: Pain Managment: Goal: General experience of comfort will improve 07/07/2023 2330 by Sammuel Cooper, RN Outcome: Progressing   Problem: Safety: Goal: Ability to remain free from injury will improve 07/07/2023 2330 by Sammuel Cooper, RN Outcome: Progressing

## 2023-07-08 NOTE — Progress Notes (Signed)
  Progress Note   Patient: Jose [name] GEX:[id number] DOB: February 22, 1961 DOA: 06/25/2023     13 DOS: the patient was seen and examined on 07/08/2023   Brief hospital course: Jose [name] is a 62 y.o. male with past medical history of peripheral disease status post right BKA on July 6 thousand 24, history of type 2 diabetes, hypertension, CKD stage IIIb, coronary disease, hyperlipidemia presented to the ER from the vascular surgery office for increasing pain and drainage from the right BKA stump. He had evidence of infection at the stump. Patient sent to the hospital for IV antibiotics and admission. He underwent right AKA 8/7. He will be discharged tomorrow to [facility] facility tomorrow.   Assessment and Plan: * 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) S/p right AKA 07/01/23. Continue pain control. Encouraged patient to work with PT, out of bed. TOC working [employer], transport arranged for tomorrow.  Stage IIIb CKD, chronic hyponatremia: Stable Cr. Monitor urine output.  Avoid nephrotoxic agents. Continue sodium bicarbonate home med. Continue fluid restriction  Dyslipidemia associated with type 2 diabetes mellitus (HCC) Continue with lipitor.  Type 2 diabetes mellitus with complication, with long-term current use of insulin (HCC) Hypoglycemia Blood sugars stable on lantus to 25u lantus (home dose 45u) Continue accucheks, sliding scale. Hold premeal insulin. Hypoglycemia protocol.  Chronic hyponatremia - baseline 125-134 Chronic, stable. Sodium 131.  Benign prostatic hyperplasia Stable.  CAD (coronary artery disease) Continue with ASA, statin.  COPD (chronic obstructive pulmonary disease) (HCC) Stable. Continue with inhalers.  Microcytic anemia-  Due to chronic illness, infection. Stable Hb around 8. No active bleeding.      Subjective: Patient is seen and examined today morning. Pain better today, Did work with PT. Advised out of bed to chair. Blood sugars  lower side noted.  Physical Exam: Vitals:   07/07/23 2126 07/08/23 0452 07/08/23 0500 07/08/23 0718  BP: 133/74 (!) 141/76  119/74  Pulse: 73 64  68  Resp:    16  Temp: 98.1 F (36.7 C) 97.8 F (36.6 C)  98 F (36.7 C)  TempSrc: Oral Oral  Oral  SpO2: 100% 100%  100%  Weight:   67.7 kg   Height:       General - Elderly African-American male, no acute distress HEENT - PERRLA, EOMI, atraumatic head, non tender sinuses. Lung - Clear, rales, rhonchi, wheezes. Heart - S1, S2 heard, no murmurs, rubs, no pedal edema Neuro - Alert, awake and oriented x 3, non focal exam. Skin - Warm and dry.  Right AKA with shrinker. Data Reviewed:  There are no new results to review at this time.  Family Communication: Patient understands and agrees with above care plan.  Disposition: Status is: Inpatient Remains inpatient appropriate because: safe dc plan  Planned Discharge Destination: Skilled nursing facility    Time spent: 42 minutes  Author: Marcelino Duster, MD 07/08/2023 1:45 PM  For on call review [email or web address].

## 2023-07-08 NOTE — TOC Transition Note (Signed)
Transition of Care [facility]) - CM/SW Discharge Note   Patient Details  Name: Jose [name] MRN: [id number] Date of Birth: 12-Jul-1961  [facility]) CM/SW Contact:  Carley Hammed, LCSW Phone Number: 07/08/2023, 11:37 AM   Clinical Narrative:    Pt to be transported to [facility] rehab on Thursday [facility] 8 - 8:30 AM on 07/09/23. Report to be called to [phone] Send pt with DC paperwork and med list.    Final next level of care: Skilled Nursing Facility Barriers to Discharge: Barriers Resolved   Patient Goals and CMS Choice      Discharge Placement                Patient chooses bed at:  [facility] rehab) Patient [facility] be transferred to [facility] by[facility] Name of family member notified: Pt states he will notify family. Patient and family notified of of transfer: 07/08/23  Discharge Plan and Services Additional resources added to the After Visit Summary for                  DME Arranged: N/A                    Social Determinants of Health (SDOH) Interventions SDOH Screenings   Food Insecurity: No Food Insecurity (06/26/2023)  Housing: Patient Declined (06/26/2023)  Transportation Needs: No Transportation Needs (06/26/2023)  Utilities: Not At Risk (06/26/2023)  Depression (PHQ2-9): High Risk (08/11/2022)  Social Connections: Unknown (03/05/2023)   Received from [facility], [facility]  Tobacco Use: Medium Risk (07/01/2023)     Readmission Risk Interventions    05/29/2023    2:21 PM 04/29/2023    1:09 PM 01/29/2022    2:46 PM  Readmission Risk Prevention Plan  Transportation Screening Complete Complete Complete  PCP or Specialist Appt within 3-5 Days   Complete  HRI or Home Care Consult   Complete  Social Work Consult for Recovery Care Planning/Counseling   Complete  Palliative Care Screening   Not Applicable  Medication Review Oceanographer) Complete Complete Complete  PCP or  Specialist appointment within 3-5 days of discharge Complete    HRI or Home Care Consult Complete Complete   SW Recovery Care/Counseling Consult Complete Complete   Palliative Care Screening Not Applicable Not Applicable   Skilled Nursing Facility Complete Not Applicable

## 2023-07-08 NOTE — Inpatient Diabetes Management (Signed)
Inpatient Diabetes Program Recommendations  AACE/ADA: New Consensus Statement on Inpatient Glycemic Control (2015)  Target Ranges:  Prepandial:   less than 140 mg/dL      Peak postprandial:   less than 180 mg/dL (1-2 hours)      Critically ill patients:  140 - 180 mg/dL   Lab Results  Component Value Date   GLUCAP 129 (H) 07/08/2023   HGBA1C 7.7 (H) 04/24/2023    Review of Glycemic Control  Latest Reference Range & Units 07/07/23 08:03 07/07/23 12:04 07/07/23 17:25 07/07/23 21:27 07/08/23 07:19 07/08/23 07:45  Glucose-Capillary 70 - 99 mg/dL 86 308 (H) 82 657 (H) 68 (L) 129 (H)   Diabetes history: DM 2 Outpatient Diabetes medications: Novolog 4 units tid meal coverage, Lantus 30 units Current orders for Inpatient glycemic control:  Semglee 25 units Daily Novolog 0-9 units tid  Inpatient Diabetes Program Recommendations:    Note: hypoglycemia again this am  -   Consider lowering Semglee dose again to 20 units.  Thanks,  Christena Deem RN, MSN, BC-ADM Inpatient Diabetes Coordinator Team Pager [phone] (8a-5p)

## 2023-07-08 NOTE — Progress Notes (Signed)
PT Cancellation Note  Patient Details Name: Jose [name] MRN: [id number] DOB: 1961-04-14   Cancelled Treatment:    Reason Eval/Treat Not Completed: Patient declined, no reason specified.  Leg is hurting too bad at this time.  Now getting pain meds and I don't want worse pain. 07/08/2023  Jacinto Halim., PT Acute Rehabilitation Services [phone]  (office)   Eliseo Gum  07/08/2023, 6:17 PM

## 2023-07-08 NOTE — Discharge Instructions (Signed)
[facility] [place] [facility]  Discharge instructions  Lower Extremity Amputation  Please refer to the following instruction for your post-procedure care. Your surgeon or physician assistant will discuss any changes with you.  Activity  You are encouraged to walk as much as you can. You can slowly return to normal activities during the month after your surgery. Avoid strenuous activity and heavy lifting until your doctor tells you it's OK. Avoid activities such as vacuuming or swinging a golf club. Do not drive until your doctor give the OK and you are no longer taking prescription pain medications. It is also normal to have difficulty with sleep habits, eating and bowel movement after surgery. These will go away with time.  Bathing/Showering  Shower daily after you go home. Do not soak in a bathtub, hot tub, or swim until the incision heals completely.  Incision Care  Clean your incision with mild soap and water. Shower every day. Pat the area dry with a clean towel. You do not need a bandage unless otherwise instructed. Do not apply any ointments or creams to your incision. If you have open wounds you will be instructed how to care for them or a visiting nurse may be arranged for you. If you have staples or sutures along your incision they will be removed at your post-op appointment. You may have skin glue on your incision. Do not peel it off. It will come off on its own in about one week.  Diet  Resume your normal diet. There are no special food restrictions following this procedure. A low fat/ low cholesterol diet is recommended for all patients with vascular disease. In order to heal from your surgery, it is CRITICAL to get adequate nutrition. Your body requires vitamins, minerals, and protein. Vegetables are the best source of vitamins and minerals. Vegetables also provide the perfect balance of protein. Processed food has little nutritional value, so try to avoid  this.  Medications  Resume taking all your medications unless your doctor or physician assistant tells you not to. If your incision is causing pain, you may take over-the-counter pain relievers such as acetaminophen (Tylenol). If you were prescribed a stronger pain medication, please aware these medication can cause nausea and constipation. Prevent nausea by taking the medication with a snack or meal. Avoid constipation by drinking plenty of fluids and eating foods with high amount of fiber, such as fruits, vegetables, and grains. Take Colace 100 mg (an over-the-counter stool softener) twice a day as needed for constipation.  Do not take Tylenol if you are taking prescription pain medications.  Follow Up  Our office will schedule a follow up appointment 4 weeks following discharge.  Please call us immediately for any of the following conditions  Increase pain, redness, warmth, or drainage (pus) from your incision site(s) Fever of 101 degree or higher The swelling in your leg with the amputation suddenly worsens and becomes more painful than when you were in the hospital  Leg swelling is common after amputation surgery.  The swelling should improve over a few months following surgery. To improve the swelling, you may elevate your legs above the level of your heart while you are sitting or resting. Your surgeon or physician assistant may ask you to apply an ACE wrap or wear compression (TED) stockings to help to reduce swelling.  Reduce your risk of vascular disease  Stop smoking. If you would like help call [facility] at 1-800-QUIT-NOW (1-800-784-8669) or [facility] at 336-586-4000.  Manage   your cholesterol Maintain a desired weight Control your diabetes weight Control your diabetes Keep your blood pressure down  If you have any questions, please call the office at [phone] 

## 2023-07-09 DIAGNOSIS — N401 Enlarged prostate with lower urinary tract symptoms: Secondary | ICD-10-CM | POA: Diagnosis not present

## 2023-07-09 DIAGNOSIS — E1159 Type 2 diabetes mellitus with other circulatory complications: Secondary | ICD-10-CM | POA: Diagnosis not present

## 2023-07-09 DIAGNOSIS — Z89611 Acquired absence of right leg above knee: Secondary | ICD-10-CM

## 2023-07-09 DIAGNOSIS — T8743 Infection of amputation stump, right lower extremity: Secondary | ICD-10-CM | POA: Diagnosis not present

## 2023-07-09 LAB — GLUCOSE, CAPILLARY: Glucose-Capillary: 140 mg/dL — ABNORMAL HIGH (ref 70–99)

## 2023-07-09 MED ORDER — INSULIN GLARGINE 100 UNIT/ML ~~LOC~~ SOLN: 25.0000 [IU] | Freq: Every day | SUBCUTANEOUS | Status: DC

## 2023-07-09 MED ORDER — CARVEDILOL 6.25 MG PO TABS: 6.2500 mg | ORAL_TABLET | Freq: Two times a day (BID) | ORAL | Status: DC

## 2023-07-09 MED ORDER — OXYCODONE HCL 5 MG PO TABS: 5.0000 mg | ORAL_TABLET | Freq: Four times a day (QID) | ORAL | 0 refills | Status: DC | PRN

## 2023-07-09 NOTE — Plan of Care (Addendum)
Patient AOX4. PIV Removed. Transported to Rehab facility. Attempted to call report with # provided in chart. No answer. MD aware that patient has not had a BM since Monday.   1132 Report called to andrea from the [place]  Problem: Education: Goal: Ability to describe self-care measures that may prevent or decrease complications (Diabetes Survival Skills Education) will improve Outcome: Adequate for Discharge Goal: Individualized Educational Video(s) Outcome: Adequate for Discharge   Problem: Coping: Goal: Ability to adjust to condition or change in health will improve Outcome: Adequate for Discharge   Problem: Fluid Volume: Goal: Ability to maintain a balanced intake and output will improve Outcome: Adequate for Discharge   Problem: Health Behavior/Discharge Planning: Goal: Ability to identify and utilize available resources and services will improve Outcome: Adequate for Discharge Goal: Ability to manage health-related needs will improve Outcome: Adequate for Discharge   Problem: Metabolic: Goal: Ability to maintain appropriate glucose levels will improve Outcome: Adequate for Discharge   Problem: Nutritional: Goal: Maintenance of adequate nutrition will improve Outcome: Adequate for Discharge Goal: Progress toward achieving an optimal weight will improve Outcome: Adequate for Discharge   Problem: Skin Integrity: Goal: Risk for impaired skin integrity will decrease Outcome: Adequate for Discharge   Problem: Tissue Perfusion: Goal: Adequacy of tissue perfusion will improve Outcome: Adequate for Discharge   Problem: Education: Goal: Knowledge of General Education information will improve Description: Including pain rating scale, medication(s)/side effects and non-pharmacologic comfort measures Outcome: Adequate for Discharge   Problem: Health Behavior/Discharge Planning: Goal: Ability to manage health-related needs will improve Outcome: Adequate for Discharge   Problem:  Clinical Measurements: Goal: Ability to maintain clinical measurements within normal limits will improve Outcome: Adequate for Discharge Goal: Will remain free from infection Outcome: Adequate for Discharge Goal: Diagnostic test results will improve Outcome: Adequate for Discharge Goal: Respiratory complications will improve Outcome: Adequate for Discharge Goal: Cardiovascular complication will be avoided Outcome: Adequate for Discharge   Problem: Activity: Goal: Risk for activity intolerance will decrease Outcome: Adequate for Discharge   Problem: Nutrition: Goal: Adequate nutrition will be maintained Outcome: Adequate for Discharge   Problem: Coping: Goal: Level of anxiety will decrease Outcome: Adequate for Discharge   Problem: Elimination: Goal: Will not experience complications related to bowel motility Outcome: Adequate for Discharge Goal: Will not experience complications related to urinary retention Outcome: Adequate for Discharge   Problem: Pain Managment: Goal: General experience of comfort will improve Outcome: Adequate for Discharge   Problem: Safety: Goal: Ability to remain free from injury will improve Outcome: Adequate for Discharge   Problem: Skin Integrity: Goal: Risk for impaired skin integrity will decrease Outcome: Adequate for Discharge

## 2023-07-09 NOTE — Progress Notes (Signed)
  Progress Note    07/09/2023 7:38 AM 8 Days Post-Op  Subjective:  no major complaints   Vitals:   07/08/23 2054 07/09/23 0437  BP: 138/75 99/64  Pulse: 74 64  Resp: 17 19  Temp: 98.6 F (37 C) 98 F (36.7 C)  SpO2: 100% 99%   Physical Exam: Cardiac:  regular Lungs:  non labored Incisions:  Right AKA well appearing, viable flaps. No drainage. Stump stocking put back on Neurologic: alert and oriented  CBC    Component Value Date/Time   WBC 8.2 07/05/2023 0024   RBC 3.17 (L) 07/05/2023 0024   HGB 8.3 (L) 07/05/2023 0024   HCT 25.1 (L) 07/05/2023 0024   PLT 378 07/05/2023 0024   MCV 79.2 (L) 07/05/2023 0024   MCH 26.2 07/05/2023 0024   MCHC 33.1 07/05/2023 0024   RDW 15.5 07/05/2023 0024   LYMPHSABS 1.8 06/26/2023 0344   MONOABS 0.9 06/26/2023 0344   EOSABS 0.2 06/26/2023 0344   BASOSABS 0.0 06/26/2023 0344    BMET    Component Value Date/Time   NA 131 (L) 07/05/2023 1218   NA 131 (L) 12/14/2020 0849   K 3.9 07/05/2023 1218   CL 99 07/05/2023 1218   CO2 23 07/05/2023 1218   GLUCOSE 171 (H) 07/05/2023 1218   BUN 16 07/05/2023 1218   BUN 26 12/14/2020 0849   CREATININE 1.35 (H) 07/05/2023 1218   CALCIUM 8.9 07/05/2023 1218   GFRNONAA 59 (L) 07/05/2023 1218   GFRAA 53 (L) 12/14/2020 0849    INR    Component Value Date/Time   INR 1.1 06/25/2023 1757     Intake/Output Summary (Last 24 hours) at 07/09/2023 0738 Last data filed at 07/09/2023 0500 Gross per 24 hour  Intake 240 ml  Output 1570 ml  Net -1330 ml     Assessment/Plan:  62 y.o. male is s/p Right AKA 8 Days Post-Op   Right AKA well appearing, dry. Stump stocking in place Clean daily. If it remains dry continue with just stump stocking Dispo SNF this morning F/u up sent for staple removal in 1 month   Graceann Congress, [place] [facility] [phone] 07/09/2023 7:38 AM

## 2023-07-09 NOTE — Plan of Care (Signed)
  Problem: Pain Managment: Goal: General experience of comfort will improve Outcome: Progressing   Problem: Safety: Goal: Ability to remain free from injury will improve Outcome: Progressing   

## 2023-07-09 NOTE — Discharge Summary (Signed)
Physician Discharge Summary   Patient: Jose [name] MRN: [id number] DOB: 02/15/1961  Admit date:     06/25/2023  Discharge date: 07/09/23  Discharge Physician: Marcelino Duster   PCP: [name]   Recommendations at discharge:    PCP follow up in 1 week. Vascular surgery clinic for staple removal in 1 week.  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 *  Hospital Course: Jose [name] is a 62 y.o. male with past medical history of peripheral disease status post right BKA on July 6 thousand 24, history of type 2 diabetes, hypertension, CKD stage IIIb, coronary disease, hyperlipidemia presented to the ER from the vascular surgery office for increasing pain and drainage from the right BKA stump. He had evidence of infection at the stump. Patient sent to the hospital for IV antibiotics and admission. He underwent right AKA 8/7 tolerated the procedure well. AKA stump wound healed satisfactorily. Vascular surgery advised Ace, stump stocking and staple removal in 1 week. PT/ OT recommended rehab, TOC worked on [employer] [facility] facility. He is hemodynamically stable to be discharged to rehab, advised to follow up with PCP, Vascular upon discharge. Scripts for pain meds given. He understands and agrees with discharge plan.   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 Continue pain control. Patient to go to [facility] rehab facility.   Stage IIIb CKD, chronic hyponatremia: Stable Cr. Monitor urine output.  Avoid nephrotoxic agents. Continue sodium bicarbonate home med. Continue fluid restriction   Dyslipidemia associated with type 2 diabetes mellitus (HCC) Continue with lipitor 80 mg.   Type 2 diabetes mellitus with complication, with long-term current use of insulin (HCC) Continue with lantus and SSI. Hypoglycemia protocol.   Chronic hyponatremia - baseline 125-134 Chronic, stable. Sodium 131.   Benign prostatic hyperplasia Stable.   CAD (coronary artery disease) Continue with ASA, plavix.   COPD (chronic obstructive pulmonary disease) (HCC) Stable. Continue with inhalers.   Microcytic anemia-  Due to chronic illness, infection. Stable Hb around 8. No active bleeding.  Poor appetite- Eating better. Continue Mirtazapine at bedtime.       Consultants: Vascular surgery Procedures performed: Right AKA  Disposition: Rehabilitation facility Diet recommendation:  Discharge Diet Orders (From admission, onward)     Start     Ordered   07/09/23 0000  Diet - low sodium heart healthy        07/09/23 0739   07/09/23 0000  Diet Carb Modified        07/09/23 0739           Cardiac and Carb modified diet DISCHARGE MEDICATION: Allergies as of 07/09/2023       Reactions   Lisinopril-hydrochlorothiazide Swelling, Other (See Comments)   Causes swelling of lips   Simvastatin Other (See Comments)   ALT (SGPT) level raised, Aspartate aminotransferase serum level raised        Medication List  STOP taking these medications    cefTRIAXone 1 g injection Commonly known as: ROCEPHIN   oxyCODONE-acetaminophen 5-325 MG tablet Commonly known as: PERCOCET/ROXICET       TAKE these medications    acetaminophen 500 MG tablet Commonly known as: TYLENOL Take 1,000 mg by mouth in the morning, at noon, and at bedtime.    albuterol 108 (90 Base) MCG/ACT inhaler Commonly known as: VENTOLIN HFA Inhale 2 puffs into the lungs every 6 (six) hours as needed for wheezing or shortness of breath.   amitriptyline 25 MG tablet Commonly known as: ELAVIL Take 1 tablet (25 mg total) by mouth at bedtime.   amLODipine 10 MG tablet Commonly known as: NORVASC Take 1 tablet (10 mg total) by mouth daily.   aspirin EC 81 MG tablet Take 81 mg by mouth daily. Swallow whole.   atorvastatin 80 MG tablet Commonly known as: LIPITOR Take 1 tablet (80 mg total) by mouth daily. What changed: when to take this   benzonatate 100 MG capsule Commonly known as: TESSALON Take 100 mg by mouth 3 (three) times daily as needed for cough.   bisacodyl 5 MG EC tablet Commonly known as: DULCOLAX Take 1 tablet (5 mg total) by mouth daily as needed for moderate constipation.   Brinzolamide-Brimonidine 1-0.2 % Susp Place 1 drop into both eyes 3 (three) times daily.   carvedilol 6.25 MG tablet Commonly known as: COREG Take 1 tablet (6.25 mg total) by mouth 2 (two) times daily with a meal. What changed:  medication strength how much to take   clopidogrel 75 MG tablet Commonly known as: PLAVIX Take 1 tablet (75 mg total) by mouth daily with breakfast.   cyanocobalamin 500 MCG tablet Commonly known as: VITAMIN B12 Take 500 mcg by mouth daily.   cyclobenzaprine 5 MG tablet Commonly known as: FLEXERIL Take 5 mg by mouth every 4 (four) hours as needed for muscle spasms.   Dextrose-Fructose-Sod Citrate 4.35-4.17-0.921 GM/15ML Liqd Take 15 cm by mouth as needed (15cm (1 tube) by mouth as needed for hypoglycemia for  glucose less than 70, repeat every 15 minutes if blood sugar less than 70.).   famotidine 20 MG tablet Commonly known as: PEPCID Take 1 tablet (20 mg total) by mouth daily.   ferrous sulfate 325 (65 FE) MG EC tablet Take 325 mg by mouth every Monday, Wednesday, and Friday.   fluticasone-salmeterol 500-50 MCG/ACT  Aepb Commonly known as: ADVAIR Inhale 1 puff into the lungs in the morning and at bedtime.   folic acid 1 MG tablet Commonly known as: FOLVITE Take 1 tablet (1 mg total) by mouth daily.   gabapentin 100 MG capsule Commonly known as: NEURONTIN Take 1 capsule (100 mg total) by mouth 3 (three) times daily. What changed: how much to take   hydrOXYzine 25 MG tablet Commonly known as: ATARAX Take 1 tablet (25 mg total) by mouth 3 (three) times daily as needed for itching or anxiety. What changed: when to take this   insulin aspart 100 UNIT/ML injection Commonly known as: novoLOG Inject 0-9 Units into the skin 3 (three) times daily with meals. Sliding scale CBG 70 - 120: 0 units CBG 121 - 150: 1 unit,  CBG 151 - 200: 2 units,  CBG 201 - 250: 3 units,  CBG 251 - 300: 5 units,  CBG 301 - 350: 7 units,  CBG 351 - 400: 9 units   CBG > 400: 9 units and notify your MD What changed: Another medication with  the same name was removed. Continue taking this medication, and follow the directions you see here.   insulin glargine 100 UNIT/ML injection Commonly known as: LANTUS Inject 0.25 mLs (25 Units total) into the skin daily. What changed: how much to take   ipratropium-albuterol 0.5-2.5 (3) MG/3ML Soln Commonly known as: DUONEB Take 3 mLs by nebulization every 6 (six) hours as needed (for shortness of breath and wheezing).   latanoprost 0.005 % ophthalmic solution Commonly known as: XALATAN Place 1 drop into both eyes at bedtime.   magnesium hydroxide 400 MG/5ML suspension Commonly known as: MILK OF MAGNESIA Take 30 mLs by mouth 2 (two) times daily as needed for mild constipation.   Magnesium Oxide 420 MG Tabs Take 420 mg by mouth daily.   mirtazapine 15 MG tablet Commonly known as: REMERON Take 1 tablet (15 mg total) by mouth at bedtime.   multivitamin with minerals Tabs tablet Take 1 tablet by mouth daily.   naloxone 4 MG/0.1ML Liqd nasal spray kit Commonly known as:  NARCAN Place 1 spray into the nose once. Instill 1 spray into one nostril as directed for opioid overdose, call 911 immediately, administer dose, then turn person on side if no response in 2-3 minutes or person responds but relapses. Repeat using a new spray device and into the other nostril.   nitroGLYCERIN 0.4 MG SL tablet Commonly known as: NITROSTAT Place 1 tablet (0.4 mg total) under the tongue every 5 (five) minutes as needed for chest pain.   omeprazole 40 MG capsule Commonly known as: PRILOSEC Take 40 mg by mouth daily.   oxyCODONE 5 MG immediate release tablet Commonly known as: Oxy IR/ROXICODONE Take 1 tablet (5 mg total) by mouth every 6 (six) hours as needed for severe pain. What changed: when to take this   polyethylene glycol 17 g packet Commonly known as: MIRALAX / GLYCOLAX Take 17 g by mouth 2 (two) times daily. What changed:  when to take this reasons to take this   senna-docusate 8.6-50 MG tablet Commonly known as: Senokot-S Take 1 tablet by mouth 2 (two) times daily for 14 days. What changed: how much to take   simethicone 40 MG/0.6ML drops Commonly known as: MYLICON Take 0.6 mLs (40 mg total) by mouth every 6 (six) hours as needed for flatulence.   sodium bicarbonate 650 MG tablet Take 1 tablet (650 mg total) by mouth 2 (two) times daily.   thiamine 100 MG tablet Commonly known as: Vitamin B-1 Take 1 tablet (100 mg total) by mouth daily.   UltiCare Insulin Syringe 30G X 1/2" 0.5 ML Misc Generic drug: Insulin Syringe-Needle U-100 Use with Lantus               Discharge Care Instructions  (From admission, onward)           Start     Ordered   07/09/23 0000  Change dressing (specify)       Comments: Dressing change daily right AKA stump: 4x4s, Ace and stump stocking   07/09/23 0739            Follow-up Information     [facility] Follow up in 1 month(s).   Why: The office will call the patient with an  appointment Contact information: [address] [place] [address] [phone]               Discharge Exam: Filed Weights   07/07/23 0500 07/08/23 0500 07/09/23 0500  Weight: 68.7 kg 67.7  kg 67.7 kg   General - Elderly African-American male, no acute distress HEENT - PERRLA, EOMI, atraumatic head, non tender sinuses. Lung - Clear, rales, rhonchi, wheezes. Heart - S1, S2 heard, no murmurs, rubs, no pedal edema Neuro - Alert, awake and oriented x 3, non focal exam. Skin - Warm and dry.  Right AKA with shrinker.  Condition at discharge: stable  The results of significant diagnostics from this hospitalization (including imaging, microbiology, ancillary and laboratory) are listed below for reference.   Imaging Studies: PERIPHERAL VASCULAR CATHETERIZATION  Result Date: 07/01/2023 See surgical note for result.  DG Knee 2 Views Right  Result Date: 06/25/2023 CLINICAL DATA:  Infection EXAM: RIGHT KNEE - 1-2 VIEW COMPARISON:  None Available. FINDINGS: Patient is status post below the knee amputation, partially visualized intramedullary rod within the tibia. Extensive vascular calcifications. Cut margins appear smooth. Soft tissue swelling at the distal stump without soft tissue emphysema. No osseous destructive change IMPRESSION: Status post below the knee amputation. Soft tissue swelling at the distal stump without soft tissue emphysema or acute osseous abnormality. Electronically Signed   By: Jasmine Pang M.D.   On: 06/25/2023 17:30    Microbiology: Results for orders placed or performed during the hospital encounter of 06/25/23  Blood Culture (routine x 2)     Status: None   Collection Time: 06/25/23  5:44 PM   Specimen: BLOOD LEFT WRIST  Result Value Ref Range Status   Specimen Description BLOOD LEFT WRIST  Final   Special Requests   Final    BOTTLES DRAWN AEROBIC ONLY Blood Culture results may not be optimal due to an inadequate volume of blood received in  culture bottles   Culture   Final    NO GROWTH 5 DAYS Performed at [facility], [address], [place], [place] [address]    Report Status 06/30/2023 FINAL  Final  Blood Culture (routine x 2)     Status: None   Collection Time: 06/25/23  5:58 PM   Specimen: BLOOD RIGHT HAND  Result Value Ref Range Status   Specimen Description BLOOD RIGHT HAND  Final   Special Requests   Final    BOTTLES DRAWN AEROBIC ONLY Blood Culture adequate volume   Culture   Final    NO GROWTH 5 DAYS Performed at [facility], [address], [place], [place] [address]    Report Status 06/30/2023 FINAL  Final  Surgical pcr screen     Status: None   Collection Time: 07/01/23  7:26 AM   Specimen: Nasal Mucosa; Nasal Swab  Result Value Ref Range Status   MRSA, PCR NEGATIVE NEGATIVE Final   Staphylococcus aureus NEGATIVE NEGATIVE Final    Comment: (NOTE) The Xpert SA Assay (FDA approved for NASAL specimens in patients 87 years of age and older), is one component of a comprehensive surveillance program. It is not intended to diagnose infection nor to guide or monitor treatment. Performed at [facility], [address], [place], [place] [address]     Labs: CBC: Recent Labs  Lab 07/02/23 2321 07/04/23 0059 07/05/23 0024  WBC 7.5 7.4 8.2  HGB 8.4* 7.9* 8.3*  HCT 24.5* 23.6* 25.1*  MCV 75.4* 75.9* 79.2*  PLT 352 320 378   Basic Metabolic Panel: Recent Labs  Lab 07/02/23 2321 07/04/23 0059 07/05/23 1218  NA 131* 130* 131*  K 3.7 3.4* 3.9  CL 96* 95* 99  CO2 24 25 23   GLUCOSE 181* 98 171*  BUN 18 18 16   CREATININE 1.42* 1.50* 1.35*  CALCIUM 8.8* 8.8* 8.9   Liver Function Tests: No results for input(s): "AST", "ALT", "ALKPHOS", "BILITOT", "PROT", "ALBUMIN" in the last 168 hours. CBG: Recent Labs  Lab 07/08/23 0719 07/08/23 0745 07/08/23 1153 07/08/23 1648 07/08/23 2352  GLUCAP 68* 129* 153* 122* 159*    Discharge time spent: greater than 30  minutes.  Signed: Marcelino Duster, MD [facility] 07/09/2023

## 2023-08-11 ENCOUNTER — Emergency Department (HOSPITAL_COMMUNITY)
Admission: EM | Admit: 2023-08-11 | Discharge: 2023-08-11 | Disposition: A | Discharge status: Home / Self Care | Payer: Medicare HMO | Attending: Emergency Medicine | Admitting: Emergency Medicine

## 2023-08-11 DIAGNOSIS — Z794 Long term (current) use of insulin: Secondary | ICD-10-CM | POA: Diagnosis not present

## 2023-08-11 DIAGNOSIS — Z7902 Long term (current) use of antithrombotics/antiplatelets: Secondary | ICD-10-CM | POA: Diagnosis not present

## 2023-08-11 DIAGNOSIS — Z7982 Long term (current) use of aspirin: Secondary | ICD-10-CM | POA: Insufficient documentation

## 2023-08-11 DIAGNOSIS — Z4802 Encounter for removal of sutures: Secondary | ICD-10-CM | POA: Insufficient documentation

## 2023-08-11 NOTE — ED Notes (Signed)
PT's wound bandaged prior to discharge.

## 2023-08-11 NOTE — ED Notes (Signed)
PT endorses no pain or problems other than needing staples/sutures removed from prior procedure.

## 2023-08-11 NOTE — ED Provider Notes (Signed)
[place] EMERGENCY DEPARTMENT AT [facility] Provider Note   CSN: [id number] Arrival date & time: 08/11/23  1035     History  Chief Complaint  Patient presents with   Suture / Staple Removal   Wound Check    Jose [name] is a 62 y.o. male presents emergency department for suture removal.  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 Lenell Antu on 07/01/2023.  Patient states that he went to admitting to get his sutures removed and they directed him to the emergency department.   Suture / Staple Removal  Wound Check       Home Medications Prior to Admission medications   Medication Sig Start Date End Date Taking? Authorizing Provider  acetaminophen (TYLENOL) 500 MG tablet Take 1,000 mg by mouth in the morning, at noon, and at bedtime.    [provider]  albuterol (PROVENTIL HFA;VENTOLIN HFA) 108 (90 BASE) MCG/ACT inhaler Inhale 2 puffs into the lungs every 6 (six) hours as needed for wheezing or shortness of breath.     [provider]  amitriptyline (ELAVIL) 25 MG tablet Take 1 tablet (25 mg total) by mouth at bedtime. 05/15/23   Rodolph Bong, MD  amLODipine (NORVASC) 10 MG tablet Take 1 tablet (10 mg total) by mouth daily. 03/12/21   [name], Deloris Ping, MD  aspirin EC 81 MG tablet Take 81 mg by mouth daily. Swallow whole.    [provider]  atorvastatin (LIPITOR) 80 MG tablet Take 1 tablet (80 mg total) by mouth daily. Patient taking differently: Take 80 mg by mouth every evening. 01/30/22   Marjie Skiff E, PA-C  benzonatate (TESSALON) 100 MG capsule Take 100 mg by mouth 3 (three) times daily as needed for cough.    [provider]  bisacodyl (DULCOLAX) 5 MG EC tablet Take 1 tablet (5 mg total) by mouth daily as needed for moderate constipation. 06/11/23   [name], Ripudeep K, MD  Brinzolamide-Brimonidine 1-0.2 % SUSP Place 1 drop into both eyes 3 (three) times daily.    [provider]  carvedilol (COREG) 6.25 MG tablet Take 1 tablet (6.25 mg total) by mouth 2 (two) times daily with a meal. 07/09/23   Marcelino Duster, MD  clopidogrel (PLAVIX) 75 MG tablet Take 1 tablet (75 mg total) by mouth daily with breakfast. 01/30/22   Marjie Skiff E, PA-C  cyanocobalamin (VITAMIN B12) 500 MCG tablet Take 500 mcg by mouth daily.    [provider]  cyclobenzaprine (FLEXERIL) 5 MG tablet Take 5 mg by mouth every 4 (four) hours as needed for muscle spasms.    [provider]  Dextrose-Fructose-Sod Citrate 4.35-4.17-0.921 GM/15ML LIQD Take 15 cm by mouth as needed (15cm (1 tube) by mouth as needed for hypoglycemia for  glucose less than 70, repeat every 15 minutes if blood sugar less than 70.).    [provider]  famotidine (PEPCID) 20 MG tablet Take 1 tablet (20 mg total) by mouth daily. 06/12/23   Cathren Harsh, MD  ferrous sulfate 325 (65 FE) MG EC tablet Take 325 mg by mouth every Monday, Wednesday, and Friday.    [provider]  fluticasone-salmeterol (ADVAIR) 500-50 MCG/ACT AEPB Inhale 1 puff into the lungs in the morning and at bedtime. 04/30/23   Lanae Boast, MD  folic acid (FOLVITE) 1 MG tablet Take 1 tablet (1 mg total) by mouth daily. 05/16/23   Rodolph Bong, MD  gabapentin (  NEURONTIN) 100 MG capsule Take 1 capsule (100 mg total) by mouth 3 (three) times daily. Patient taking differently: Take 300 mg by mouth 3 (three) times daily. 06/11/23   [name], Delene Ruffini, MD  hydrOXYzine (ATARAX) 25 MG tablet Take 1 tablet (25 mg total) by mouth 3 (three) times daily as needed for itching or anxiety. Patient taking differently: Take 25 mg by mouth 2 (two) times daily as needed for itching or anxiety. 05/15/23   Rodolph Bong, MD  insulin aspart (NOVOLOG) 100 UNIT/ML injection Inject 0-9 Units into the skin 3 (three) times daily with meals. Sliding scale CBG 70 - 120: 0 units CBG 121 - 150: 1 unit,  CBG 151 - 200: 2 units,  CBG 201 -  250: 3 units,  CBG 251 - 300: 5 units,  CBG 301 - 350: 7 units,  CBG 351 - 400: 9 units   CBG > 400: 9 units and notify your MD 06/11/23   [name], Delene Ruffini, MD  insulin glargine (LANTUS) 100 UNIT/ML injection Inject 0.25 mLs (25 Units total) into the skin daily. 07/09/23   Marcelino Duster, MD  Insulin Syringe-Needle U-100 30G X 1/2" 0.5 ML MISC Use with Lantus 05/15/23   Rodolph Bong, MD  ipratropium-albuterol (DUONEB) 0.5-2.5 (3) MG/3ML SOLN Take 3 mLs by nebulization every 6 (six) hours as needed (for shortness of breath and wheezing).    [provider]  latanoprost (XALATAN) 0.005 % ophthalmic solution Place 1 drop into both eyes at bedtime. 06/11/23   [name], Delene Ruffini, MD  magnesium hydroxide (MILK OF MAGNESIA) 400 MG/5ML suspension Take 30 mLs by mouth 2 (two) times daily as needed for mild constipation.    [provider]  Magnesium Oxide 420 MG TABS Take 420 mg by mouth daily.    [provider]  mirtazapine (REMERON) 15 MG tablet Take 1 tablet (15 mg total) by mouth at bedtime. 06/11/23   [name], Delene Ruffini, MD  Multiple Vitamin (MULTIVITAMIN WITH MINERALS) TABS tablet Take 1 tablet by mouth daily.    [provider]  naloxone [facility]) nasal spray 4 mg/0.1 mL Place 1 spray into the nose once. Instill 1 spray into one nostril as directed for opioid overdose, call 911 immediately, administer dose, then turn person on side if no response in 2-3 minutes or person responds but relapses. Repeat using a new spray device and into the other nostril. Patient not taking: Reported on 06/26/2023    [provider]  nitroGLYCERIN (NITROSTAT) 0.4 MG SL tablet Place 1 tablet (0.4 mg total) under the tongue every 5 (five) minutes as needed for chest pain. Patient not taking: Reported on 06/25/2023 01/29/22   Corrin Parker, PA-C  omeprazole (PRILOSEC) 40 MG capsule Take 40 mg by mouth daily.    [provider]  oxyCODONE (OXY IR/ROXICODONE) 5 MG immediate  release tablet Take 1 tablet (5 mg total) by mouth every 6 (six) hours as needed for severe pain. 07/09/23   Marcelino Duster, MD  polyethylene glycol (MIRALAX / GLYCOLAX) 17 g packet Take 17 g by mouth 2 (two) times daily. Patient taking differently: Take 17 g by mouth daily as needed for mild constipation. 04/30/23   Lanae Boast, MD  senna-docusate (SENOKOT-S) 8.6-50 MG tablet Take 1 tablet by mouth 2 (two) times daily for 14 days. Patient taking differently: Take 2 tablets by mouth 2 (two) times daily. 06/11/23 06/25/24  [name], Delene Ruffini, MD  simethicone (MYLICON) 40 MG/0.6ML drops Take 0.6 mLs (40  mg total) by mouth every 6 (six) hours as needed for flatulence. 06/11/23   [name], Ripudeep K, MD  sodium bicarbonate 650 MG tablet Take 1 tablet (650 mg total) by mouth 2 (two) times daily. 06/11/23   [name], Delene Ruffini, MD  thiamine (VITAMIN B-1) 100 MG tablet Take 1 tablet (100 mg total) by mouth daily. Patient not taking: Reported on 06/26/2023 05/16/23   Rodolph Bong, MD      Allergies    Lisinopril-hydrochlorothiazide and Simvastatin    Review of Systems   Review of Systems  Physical Exam Updated Vital Signs BP (!) 166/86 (BP Location: Left Arm)   Pulse 76   Temp 98.3 F (36.8 C) (Oral)   Resp 15   Ht 5\' 11"  (1.803 m)   Wt 68 kg   SpO2 98%   BMI 20.92 kg/m  Physical Exam  ED Results / Procedures / Treatments   Labs (all labs ordered are listed, but only abnormal results are displayed) Labs Reviewed - No data to display  EKG None  Radiology No results found.  Procedures .Suture Removal  Date/Time: 08/11/2023 4:52 PM  Performed by: Arthor Captain, PA-C Authorized by: Arthor Captain, PA-C   Consent:    Consent obtained:  Verbal   Consent given by:  Patient   Risks discussed:  Bleeding, pain and wound separation   Alternatives discussed:  No treatment Universal protocol:    Patient identity confirmed:  Arm band Location:    Location:  Lower extremity   Lower  extremity location:  Leg   Leg location: R bka. Procedure details:    Wound appearance:  No signs of infection, good wound healing, clean and nontender   Number of staples removed:  21 Post-procedure details:    Post-removal:  Dressing applied   Procedure completion:  Tolerated with difficulty     Medications Ordered in ED Medications - No data to display  ED Course/ Medical Decision Making/ A&P                                 Medical Decision Making Patient here for staple removal after below-knee amputation on 07/01/2023.  I discussed the case with Dr. Durwin Nora who recommends staple removal if the wound is well-appearing.  Will have the patient follow-up in clinic with vascular surgery.  Gust outpatient follow-up and return precautions.         Final Clinical Impression(s) / ED Diagnoses Final diagnoses:  None    Rx / DC Orders ED Discharge Orders     None         Arthor Captain, PA-C 08/11/23 1655    Gwyneth Sprout, MD 08/12/23 0013

## 2023-08-11 NOTE — ED Triage Notes (Signed)
Pt. Stated, I had an appt. At 1030 but didn't tell me were. So Im here to get the staples out . Ive had a below the knee rt. Amputee. Staples been there since August

## 2023-08-11 NOTE — Discharge Instructions (Signed)
Contact a health care provider if: You have more redness, swelling, or pain around your wound. You have fluid or blood coming from your wound. You have new warmth, a rash, or hardness at the wound site. You have pus or a bad smell coming from your wound. Your wound opens up. Get help right away if: You have a fever or chills. You have red streaks coming from your wound. 

## 2023-08-28 ENCOUNTER — Ambulatory Visit (INDEPENDENT_AMBULATORY_CARE_PROVIDER_SITE_OTHER): Payer: No Typology Code available for payment source | Admitting: Physician Assistant

## 2023-08-28 VITALS — BP 123/81 | HR 69 | Temp 98.0°F | Resp 18 | Ht 71.0 in | Wt 153.0 lb

## 2023-08-28 DIAGNOSIS — I739 Peripheral vascular disease, unspecified: Secondary | ICD-10-CM | POA: Diagnosis not present

## 2023-08-28 NOTE — Progress Notes (Signed)
[facility] [place] HISTORY AND PHYSICAL   History of Present Illness:  Patient is a 62 y.o. 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 Lenell Antu on 07/01/23.  The right AKA has healed well.  Staples were removed in the ED on 08/11/23.  He hit his left GT on something and it is now sore. There are no open wounds at this time, but there is a dark spot on the toe tip.   He denies rest pain and uses the left LE for tranfers.  He is medically managed with ASA, Statin and Plavix daily.    Past Medical History:  Diagnosis Date   Alcohol abuse    CAD (coronary artery disease)    a. Reported MI in 2012 s/p 2 stents;  b. 08/2012 Cath: LM 20, LAD 20 diff ISR, jailed septal - 99%, LCX 30ost, RI large, min irregs, RCA 30p, 20-30 ISR-->Med Rx; c. 2017 [facility].   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 INVASIVE CV LAB;  Service: Cardiovascular;  Laterality: N/A;   AMPUTATION Right 05/30/2023   Procedure: AMPUTATION BELOW KNEE;  Surgeon: Maeola Harman, MD;  Location: [facility] OR;  Service: Vascular;  Laterality: Right;   AMPUTATION Right 07/01/2023   Procedure: RIGHT ABOVE KNEE AMPUTATION;  Surgeon: Leonie Douglas, MD;  Location: [facility] OR;  Service: Vascular;  Laterality: Right;   APPLICATION OF  WOUND VAC Right 07/01/2023   Procedure: APPLICATION OF WOUND VAC;  Surgeon: Leonie Douglas, MD;  Location: [facility] OR;  Service: Vascular;  Laterality: Right;   CARDIAC CATHETERIZATION     CORONARY STENT PLACEMENT     FRACTURE SURGERY Right    hardware from right leg fracture   LEFT HEART CATH AND CORONARY ANGIOGRAPHY N/A 08/22/2020   Procedure: LEFT HEART CATH AND CORONARY ANGIOGRAPHY;  Surgeon: Lyn Records, MD;  Location: [facility] INVASIVE CV LAB;  Service: Cardiovascular;  Laterality: N/A;   LEFT HEART CATH AND CORONARY ANGIOGRAPHY N/A 01/28/2022   Procedure: LEFT HEART CATH AND CORONARY ANGIOGRAPHY;  Surgeon: Lennette Bihari, MD;  Location: [facility] INVASIVE CV LAB;  Service: Cardiovascular;  Laterality: N/A;   LEFT HEART CATHETERIZATION WITH CORONARY ANGIOGRAM Bilateral 08/25/2012   Procedure: LEFT HEART CATHETERIZATION WITH CORONARY ANGIOGRAM;  Surgeon: Kathleene Hazel, MD;  Location: [facility] CATH LAB;  Service: Cardiovascular;  Laterality: Bilateral;   PERIPHERAL INTRAVASCULAR LITHOTRIPSY  04/27/2023   Procedure: PERIPHERAL INTRAVASCULAR LITHOTRIPSY;  Surgeon: Maeola Harman, MD;  Location: [facility] INVASIVE CV LAB;  Service: Cardiovascular;;   PERIPHERAL VASCULAR BALLOON ANGIOPLASTY  04/27/2023   Procedure: PERIPHERAL VASCULAR BALLOON ANGIOPLASTY;  Surgeon: Maeola Harman, MD;  Location: [facility] INVASIVE CV LAB;  Service: Cardiovascular;;   TRANSMETATARSAL AMPUTATION Right 05/12/2023   Procedure: TRANSMETATARSAL AMPUTATION;  Surgeon: Edilia Bo,  Di Kindle, MD;  Location: [facility] OR;  Service: Vascular;  Laterality: Right;    ROS:   General:  No weight loss, Fever, chills  HEENT: No recent headaches, no nasal bleeding, no visual changes, no sore throat  Neurologic: No dizziness, blackouts, seizures. No recent symptoms of stroke or mini- stroke. No recent episodes of slurred speech, or temporary blindness.  Cardiac: No recent episodes of chest pain/pressure, no shortness of breath at  rest.  No shortness of breath with exertion.  Denies history of atrial fibrillation or irregular heartbeat  Vascular: No history of rest pain in feet.  No history of claudication.  No history of non-healing ulcer, No history of DVT   Pulmonary: No home oxygen, no productive cough, no hemoptysis,  No asthma or wheezing  Musculoskeletal:  [ ]  Arthritis, [ ]  Low back pain,  [ ]  Joint pain  Hematologic:No history of hypercoagulable state.  No history of easy bleeding.  No history of anemia  Gastrointestinal: No hematochezia or melena,  No gastroesophageal reflux, no trouble swallowing  Urinary: [ ]  chronic Kidney disease, [ ]  on HD - [ ]  MWF or [ ]  TTHS, [ ]  Burning with urination, [ ]  Frequent urination, [ ]  Difficulty urinating;   Skin: No rashes  Psychological: No history of anxiety,  No history of depression  Social History Social History   Tobacco Use   Smoking status: Former    Current packs/day: 0.00    Types: Cigarettes    Quit date: 11/25/2007    Years since quitting: 15.7   Smokeless tobacco: Never  Vaping Use   Vaping status: Never Used  Substance Use Topics   Alcohol use: Yes    Comment: Used to drink heavily - says currently 2 beers/day.   Drug use: Yes    Types: Marijuana    Comment: reports not using cocaine    Family History Family History  Problem Relation Age of Onset   Heart disease Mother        MI 69s    Allergies  Allergies  Allergen Reactions   Lisinopril-Hydrochlorothiazide Swelling and Other (See Comments)    Causes swelling of lips   Simvastatin Other (See Comments)    ALT (SGPT) level raised, Aspartate aminotransferase serum level raised     Current Outpatient Medications  Medication Sig Dispense Refill   acetaminophen (TYLENOL) 500 MG tablet Take 1,000 mg by mouth in the morning, at noon, and at bedtime.     albuterol (PROVENTIL HFA;VENTOLIN HFA) 108 (90 BASE) MCG/ACT inhaler Inhale 2 puffs into the lungs every 6 (six) hours as needed  for wheezing or shortness of breath.      amitriptyline (ELAVIL) 25 MG tablet Take 1 tablet (25 mg total) by mouth at bedtime. 30 tablet 1   amLODipine (NORVASC) 10 MG tablet Take 1 tablet (10 mg total) by mouth daily. 90 tablet 3   aspirin EC 81 MG tablet Take 81 mg by mouth daily. Swallow whole.     atorvastatin (LIPITOR) 80 MG tablet Take 1 tablet (80 mg total) by mouth daily. (Patient taking differently: Take 80 mg by mouth every evening.) 30 tablet 2   benzonatate (TESSALON) 100 MG capsule Take 100 mg by mouth 3 (three) times daily as needed for cough.     bisacodyl (DULCOLAX) 5 MG EC tablet Take 1 tablet (5 mg total) by mouth daily as needed for moderate constipation.     Brinzolamide-Brimonidine 1-0.2 % SUSP Place 1 drop into both eyes 3 (  three) times daily.     carvedilol (COREG) 6.25 MG tablet Take 1 tablet (6.25 mg total) by mouth 2 (two) times daily with a meal.     clopidogrel (PLAVIX) 75 MG tablet Take 1 tablet (75 mg total) by mouth daily with breakfast. 30 tablet 5   cyanocobalamin (VITAMIN B12) 500 MCG tablet Take 500 mcg by mouth daily.     cyclobenzaprine (FLEXERIL) 5 MG tablet Take 5 mg by mouth every 4 (four) hours as needed for muscle spasms.     Dextrose-Fructose-Sod Citrate 4.35-4.17-0.921 GM/15ML LIQD Take 15 cm by mouth as needed (15cm (1 tube) by mouth as needed for hypoglycemia for  glucose less than 70, repeat every 15 minutes if blood sugar less than 70.).     famotidine (PEPCID) 20 MG tablet Take 1 tablet (20 mg total) by mouth daily.     ferrous sulfate 325 (65 FE) MG EC tablet Take 325 mg by mouth every Monday, Wednesday, and Friday.     fluticasone-salmeterol (ADVAIR) 500-50 MCG/ACT AEPB Inhale 1 puff into the lungs in the morning and at bedtime.     folic acid (FOLVITE) 1 MG tablet Take 1 tablet (1 mg total) by mouth daily.     gabapentin (NEURONTIN) 100 MG capsule Take 1 capsule (100 mg total) by mouth 3 (three) times daily. (Patient taking differently: Take 300 mg  by mouth 3 (three) times daily.)     hydrOXYzine (ATARAX) 25 MG tablet Take 1 tablet (25 mg total) by mouth 3 (three) times daily as needed for itching or anxiety. (Patient taking differently: Take 25 mg by mouth 2 (two) times daily as needed for itching or anxiety.) 20 tablet 0   insulin aspart (NOVOLOG) 100 UNIT/ML injection Inject 0-9 Units into the skin 3 (three) times daily with meals. Sliding scale CBG 70 - 120: 0 units CBG 121 - 150: 1 unit,  CBG 151 - 200: 2 units,  CBG 201 - 250: 3 units,  CBG 251 - 300: 5 units,  CBG 301 - 350: 7 units,  CBG 351 - 400: 9 units   CBG > 400: 9 units and notify your MD     insulin glargine (LANTUS) 100 UNIT/ML injection Inject 0.25 mLs (25 Units total) into the skin daily.     Insulin Syringe-Needle U-100 30G X 1/2" 0.5 ML MISC Use with Lantus 30 each 0   ipratropium-albuterol (DUONEB) 0.5-2.5 (3) MG/3ML SOLN Take 3 mLs by nebulization every 6 (six) hours as needed (for shortness of breath and wheezing).     latanoprost (XALATAN) 0.005 % ophthalmic solution Place 1 drop into both eyes at bedtime.     magnesium hydroxide (MILK OF MAGNESIA) 400 MG/5ML suspension Take 30 mLs by mouth 2 (two) times daily as needed for mild constipation.     Magnesium Oxide 420 MG TABS Take 420 mg by mouth daily.     mirtazapine (REMERON) 15 MG tablet Take 1 tablet (15 mg total) by mouth at bedtime. 20 tablet 0   Multiple Vitamin (MULTIVITAMIN WITH MINERALS) TABS tablet Take 1 tablet by mouth daily.     naloxone (NARCAN) nasal spray 4 mg/0.1 mL Place 1 spray into the nose once. Instill 1 spray into one nostril as directed for opioid overdose, call 911 immediately, administer dose, then turn person on side if no response in 2-3 minutes or person responds but relapses. Repeat using a new spray device and into the other nostril.     nitroGLYCERIN (NITROSTAT) 0.4 MG SL  tablet Place 1 tablet (0.4 mg total) under the tongue every 5 (five) minutes as needed for chest pain. 25 tablet 2    omeprazole (PRILOSEC) 40 MG capsule Take 40 mg by mouth daily.     oxyCODONE (OXY IR/ROXICODONE) 5 MG immediate release tablet Take 1 tablet (5 mg total) by mouth every 6 (six) hours as needed for severe pain. 15 tablet 0   polyethylene glycol (MIRALAX / GLYCOLAX) 17 g packet Take 17 g by mouth 2 (two) times daily. (Patient taking differently: Take 17 g by mouth daily as needed for mild constipation.) 14 each 0   senna-docusate (SENOKOT-S) 8.6-50 MG tablet Take 1 tablet by mouth 2 (two) times daily for 14 days. (Patient taking differently: Take 2 tablets by mouth 2 (two) times daily.)     simethicone (MYLICON) 40 MG/0.6ML drops Take 0.6 mLs (40 mg total) by mouth every 6 (six) hours as needed for flatulence.     sodium bicarbonate 650 MG tablet Take 1 tablet (650 mg total) by mouth 2 (two) times daily.     thiamine (VITAMIN B-1) 100 MG tablet Take 1 tablet (100 mg total) by mouth daily.     No current facility-administered medications for this visit.    Physical Examination  Vitals:   08/28/23 0830  BP: 123/81  Pulse: 69  Resp: 18  Temp: 98 F (36.7 C)  TempSrc: Temporal  SpO2: 97%  Weight: 153 lb (69.4 kg)  Height: 5\' 11"  (1.803 m)    Body mass index is 21.34 kg/m.  General:  Alert and oriented, no acute distress HEENT: Normal Neck: No bruit or JVD Pulmonary: Clear to auscultation bilaterally Cardiac: Regular Rate and Rhythm without murmur Abdomen: Soft, non-tender, non-distended, no mass, no scars Skin: No rash     Extremity Pulses:  palpable B femoral pulses, Doppler signal at the AT/high DP and PT Musculoskeletal: minimal l;eft foot edema  Neurologic: Upper and lower extremity motor grossly intact and symmetric     ASSESSMENT/PLAN:  Patient is a 62 y.o. 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 Lenell Antu on 07/01/23.  The right AKA has healed well.  Staples were removed in  the ED on 08/11/23.  New left GT toe injury without open wound.  He has a small dark spot on the GT tip. He has doppler flow on the left to the high DP, cap refill is slow on the toes on the left foot.  I will schedule him for an arterial duplex and GT check.  He may need angiogram on the left to see if his inflow can be improved to prevent skin breakdown from the toe injury.  He has an ABI from 05/09/23 that shows a Zero toe pressure.  He has not had any formal diagnostic studies on the left LE.  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  If the GT toe becomes an open wound he will need to proceed with angiogram.    He would benefit from right AKA prothesis to assist with lifestyle independence and transfers.  He currently is putting all his weight on the left LE and this is how he injured his left GT toe.  I recommend he get a prothesis through the [place] if possible.     F/U with Dr. Randie Heinz after the l;eft LE arterial duplex is done within the next 2-3 weeks if possible.  Mosetta Pigeon PA-C [facility] [place] Office: [phone]  MD in clinic [place]

## 2023-08-31 ENCOUNTER — Other Ambulatory Visit: Payer: Self-pay | Admitting: *Deleted

## 2023-08-31 DIAGNOSIS — T8189XA Other complications of procedures, not elsewhere classified, initial encounter: Secondary | ICD-10-CM

## 2023-08-31 DIAGNOSIS — I739 Peripheral vascular disease, unspecified: Secondary | ICD-10-CM

## 2023-09-01 ENCOUNTER — Ambulatory Visit (HOSPITAL_COMMUNITY)
Admission: RE | Admit: 2023-09-01 | Discharge: 2023-09-01 | Disposition: A | Discharge status: Home / Self Care | Payer: No Typology Code available for payment source | Source: Ambulatory Visit | Attending: Vascular Surgery | Admitting: Vascular Surgery

## 2023-09-01 ENCOUNTER — Ambulatory Visit (INDEPENDENT_AMBULATORY_CARE_PROVIDER_SITE_OTHER)
Admission: RE | Admit: 2023-09-01 | Discharge: 2023-09-01 | Disposition: A | Discharge status: Home / Self Care | Payer: No Typology Code available for payment source | Source: Ambulatory Visit | Attending: Vascular Surgery | Admitting: Vascular Surgery

## 2023-09-01 DIAGNOSIS — I2510 Atherosclerotic heart disease of native coronary artery without angina pectoris: Secondary | ICD-10-CM | POA: Insufficient documentation

## 2023-09-01 DIAGNOSIS — I10 Essential (primary) hypertension: Secondary | ICD-10-CM | POA: Insufficient documentation

## 2023-09-01 DIAGNOSIS — I739 Peripheral vascular disease, unspecified: Secondary | ICD-10-CM | POA: Insufficient documentation

## 2023-09-01 DIAGNOSIS — E785 Hyperlipidemia, unspecified: Secondary | ICD-10-CM | POA: Insufficient documentation

## 2023-09-01 DIAGNOSIS — T8189XA Other complications of procedures, not elsewhere classified, initial encounter: Secondary | ICD-10-CM | POA: Diagnosis not present

## 2023-09-01 DIAGNOSIS — X58XXXA Exposure to other specified factors, initial encounter: Secondary | ICD-10-CM | POA: Insufficient documentation

## 2023-09-01 DIAGNOSIS — Z89611 Acquired absence of right leg above knee: Secondary | ICD-10-CM | POA: Diagnosis not present

## 2023-09-01 LAB — VAS US ABI WITH/WO TBI

## 2023-09-16 ENCOUNTER — Ambulatory Visit (INDEPENDENT_AMBULATORY_CARE_PROVIDER_SITE_OTHER): Payer: No Typology Code available for payment source | Admitting: Vascular Surgery

## 2023-09-16 ENCOUNTER — Encounter: Payer: Self-pay | Admitting: Vascular Surgery

## 2023-09-16 VITALS — BP 125/80 | HR 78 | Temp 98.2°F | Ht 71.0 in | Wt 153.0 lb

## 2023-09-16 DIAGNOSIS — I7025 Atherosclerosis of native arteries of other extremities with ulceration: Secondary | ICD-10-CM

## 2023-09-16 NOTE — Progress Notes (Signed)
Patient ID: Jose [name], male   DOB: 10/28/1961, 62 y.o.   MRN: [id number]  Reason for Consult: Follow-up   Referred by Clinic, [name]  Subjective:     HPI:  Jose [name] is a 62 y.o. male history of right AKA more recently was evaluated found to have left great toe discoloration.  Patient also has diabetes, hyperlipidemia hypertension.  He is a former smoker quit many years ago.  He is on aspirin and a statin is unaware of any blood thinners.  He is followed closely at the [facility] in [place], [place].  Past Medical History:  Diagnosis Date   Alcohol abuse    CAD (coronary artery disease)    a. Reported MI in 2012 s/p 2 stents;  b. 08/2012 Cath: LM 20, LAD 20 diff ISR, jailed septal - 99%, LCX 30ost, RI large, min irregs, RCA 30p, 20-30 ISR-->Med Rx; c. 2017 Pt [facility].   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   Gangrene of right foot (HCC) 04/24/2023   Hyperlipidemia    Hypertension    Hypertensive urgency 02/11/2021   Metabolic acidosis 05/11/2023   Noncompliance    Family History  Problem Relation Age of Onset   Heart disease Mother        MI 15s   Past Surgical History:  Procedure Laterality Date   ABDOMINAL AORTOGRAM W/LOWER EXTREMITY N/A 04/27/2023   Procedure: ABDOMINAL AORTOGRAM W/LOWER EXTREMITY;  Surgeon: Maeola Harman, MD;  Location: [facility] INVASIVE CV LAB;  Service: Cardiovascular;  Laterality: N/A;   AMPUTATION Right 05/30/2023   Procedure: AMPUTATION BELOW KNEE;  Surgeon: Maeola Harman, MD;  Location: [facility] OR;  Service: Vascular;  Laterality: Right;   AMPUTATION Right 07/01/2023   Procedure: RIGHT ABOVE KNEE AMPUTATION;  Surgeon: Leonie Douglas, MD;  Location: [facility] OR;  Service: Vascular;  Laterality:  Right;   APPLICATION OF WOUND VAC Right 07/01/2023   Procedure: APPLICATION OF WOUND VAC;  Surgeon: Leonie Douglas, MD;  Location: [facility] OR;  Service: Vascular;  Laterality: Right;   CARDIAC CATHETERIZATION     CORONARY STENT PLACEMENT     FRACTURE SURGERY Right    hardware from right leg fracture   LEFT HEART CATH AND CORONARY ANGIOGRAPHY N/A 08/22/2020   Procedure: LEFT HEART CATH AND CORONARY ANGIOGRAPHY;  Surgeon: Lyn Records, MD;  Location: [facility] INVASIVE CV LAB;  Service: Cardiovascular;  Laterality: N/A;   LEFT HEART CATH AND CORONARY ANGIOGRAPHY N/A 01/28/2022   Procedure: LEFT HEART CATH AND CORONARY ANGIOGRAPHY;  Surgeon: Lennette Bihari, MD;  Location: [facility] INVASIVE CV LAB;  Service: Cardiovascular;  Laterality: N/A;   LEFT HEART CATHETERIZATION WITH CORONARY ANGIOGRAM Bilateral 08/25/2012   Procedure: LEFT HEART CATHETERIZATION WITH CORONARY ANGIOGRAM;  Surgeon: Kathleene Hazel, MD;  Location: [facility] CATH LAB;  Service: Cardiovascular;  Laterality: Bilateral;   PERIPHERAL INTRAVASCULAR LITHOTRIPSY  04/27/2023   Procedure: PERIPHERAL INTRAVASCULAR LITHOTRIPSY;  Surgeon: Maeola Harman, MD;  Location: [facility] INVASIVE CV LAB;  Service: Cardiovascular;;   PERIPHERAL VASCULAR BALLOON ANGIOPLASTY  04/27/2023   Procedure: PERIPHERAL VASCULAR BALLOON ANGIOPLASTY;  Surgeon: Maeola Harman, MD;  Location: [facility] INVASIVE CV LAB;  Service: Cardiovascular;;   TRANSMETATARSAL AMPUTATION Right 05/12/2023   Procedure: TRANSMETATARSAL AMPUTATION;  Surgeon: Chuck Hint, MD;  Location: [facility] OR;  Service: Vascular;  Laterality: Right;    Short Social History:  Social History   Tobacco Use   Smoking status: Former    Current packs/day: 0.00    Types: Cigarettes    Quit date: 11/25/2007    Years since quitting: 15.8   Smokeless tobacco: Never  Substance Use Topics   Alcohol use: Yes    Comment: Used to drink heavily - says currently 2 beers/day.    Allergies  Allergen  Reactions   Lisinopril-Hydrochlorothiazide Swelling and Other (See Comments)    Causes swelling of lips   Simvastatin Other (See Comments)    ALT (SGPT) level raised, Aspartate aminotransferase serum level raised    Current Outpatient Medications  Medication Sig Dispense Refill   acetaminophen (TYLENOL) 500 MG tablet Take 1,000 mg by mouth in the morning, at noon, and at bedtime.     albuterol (PROVENTIL HFA;VENTOLIN HFA) 108 (90 BASE) MCG/ACT inhaler Inhale 2 puffs into the lungs every 6 (six) hours as needed for wheezing or shortness of breath.      amitriptyline (ELAVIL) 25 MG tablet Take 1 tablet (25 mg total) by mouth at bedtime. 30 tablet 1   amLODipine (NORVASC) 10 MG tablet Take 1 tablet (10 mg total) by mouth daily. 90 tablet 3   aspirin EC 81 MG tablet Take 81 mg by mouth daily. Swallow whole.     atorvastatin (LIPITOR) 80 MG tablet Take 1 tablet (80 mg total) by mouth daily. (Patient taking differently: Take 80 mg by mouth every evening.) 30 tablet 2   benzonatate (TESSALON) 100 MG capsule Take 100 mg by mouth 3 (three) times daily as needed for cough.     bisacodyl (DULCOLAX) 5 MG EC tablet Take 1 tablet (5 mg total) by mouth daily as needed for moderate constipation.     Brinzolamide-Brimonidine 1-0.2 % SUSP Place 1 drop into both eyes 3 (three) times daily.     carvedilol (COREG) 6.25 MG tablet Take 1 tablet (6.25 mg total) by mouth 2 (two) times daily with a meal.     clopidogrel (PLAVIX) 75 MG tablet Take 1 tablet (75 mg total) by mouth daily with breakfast. 30 tablet 5   cyanocobalamin (VITAMIN B12) 500 MCG tablet Take 500 mcg by mouth daily.     cyclobenzaprine (FLEXERIL) 5 MG tablet Take 5 mg by mouth every 4 (four) hours as needed for muscle spasms.     Dextrose-Fructose-Sod Citrate 4.35-4.17-0.921 GM/15ML LIQD Take 15 cm by mouth as needed (15cm (1 tube) by mouth as needed for hypoglycemia for  glucose less than 70, repeat every 15 minutes if blood sugar less than 70.).      famotidine (PEPCID) 20 MG tablet Take 1 tablet (20 mg total) by mouth daily.     ferrous sulfate 325 (65 FE) MG EC tablet Take 325 mg by mouth every Monday, Wednesday, and Friday.     fluticasone-salmeterol (ADVAIR) 500-50 MCG/ACT AEPB Inhale 1 puff into the lungs in the morning and at bedtime.     folic acid (FOLVITE) 1 MG tablet Take 1 tablet (1 mg total) by mouth daily.     gabapentin (NEURONTIN) 100 MG capsule Take 1 capsule (100 mg total) by mouth 3 (three) times daily. (Patient taking differently: Take 300 mg by mouth 3 (three) times daily.)     hydrOXYzine (ATARAX) 25 MG tablet Take 1 tablet (25 mg total) by mouth 3 (three) times daily as needed for  itching or anxiety. (Patient taking differently: Take 25 mg by mouth 2 (two) times daily as needed for itching or anxiety.) 20 tablet 0   insulin aspart (NOVOLOG) 100 UNIT/ML injection Inject 0-9 Units into the skin 3 (three) times daily with meals. Sliding scale CBG 70 - 120: 0 units CBG 121 - 150: 1 unit,  CBG 151 - 200: 2 units,  CBG 201 - 250: 3 units,  CBG 251 - 300: 5 units,  CBG 301 - 350: 7 units,  CBG 351 - 400: 9 units   CBG > 400: 9 units and notify your MD     insulin glargine (LANTUS) 100 UNIT/ML injection Inject 0.25 mLs (25 Units total) into the skin daily.     Insulin Syringe-Needle U-100 30G X 1/2" 0.5 ML MISC Use with Lantus 30 each 0   ipratropium-albuterol (DUONEB) 0.5-2.5 (3) MG/3ML SOLN Take 3 mLs by nebulization every 6 (six) hours as needed (for shortness of breath and wheezing).     latanoprost (XALATAN) 0.005 % ophthalmic solution Place 1 drop into both eyes at bedtime.     magnesium hydroxide (MILK OF MAGNESIA) 400 MG/5ML suspension Take 30 mLs by mouth 2 (two) times daily as needed for mild constipation.     Magnesium Oxide 420 MG TABS Take 420 mg by mouth daily.     mirtazapine (REMERON) 15 MG tablet Take 1 tablet (15 mg total) by mouth at bedtime. 20 tablet 0   Multiple Vitamin (MULTIVITAMIN WITH MINERALS) TABS tablet  Take 1 tablet by mouth daily.     naloxone (NARCAN) nasal spray 4 mg/0.1 mL Place 1 spray into the nose once. Instill 1 spray into one nostril as directed for opioid overdose, call 911 immediately, administer dose, then turn person on side if no response in 2-3 minutes or person responds but relapses. Repeat using a new spray device and into the other nostril.     nitroGLYCERIN (NITROSTAT) 0.4 MG SL tablet Place 1 tablet (0.4 mg total) under the tongue every 5 (five) minutes as needed for chest pain. 25 tablet 2   omeprazole (PRILOSEC) 40 MG capsule Take 40 mg by mouth daily.     oxyCODONE (OXY IR/ROXICODONE) 5 MG immediate release tablet Take 1 tablet (5 mg total) by mouth every 6 (six) hours as needed for severe pain. 15 tablet 0   polyethylene glycol (MIRALAX / GLYCOLAX) 17 g packet Take 17 g by mouth 2 (two) times daily. (Patient taking differently: Take 17 g by mouth daily as needed for mild constipation.) 14 each 0   senna-docusate (SENOKOT-S) 8.6-50 MG tablet Take 1 tablet by mouth 2 (two) times daily for 14 days. (Patient taking differently: Take 2 tablets by mouth 2 (two) times daily.)     simethicone (MYLICON) 40 MG/0.6ML drops Take 0.6 mLs (40 mg total) by mouth every 6 (six) hours as needed for flatulence.     sodium bicarbonate 650 MG tablet Take 1 tablet (650 mg total) by mouth 2 (two) times daily.     thiamine (VITAMIN B-1) 100 MG tablet Take 1 tablet (100 mg total) by mouth daily.     No current facility-administered medications for this visit.    Review of Systems  Constitutional:  Constitutional negative. HENT: HENT negative.  Eyes: Eyes negative.  Cardiovascular: Cardiovascular negative.  GI: Gastrointestinal negative.  Musculoskeletal:       Right above-knee amputation Skin:       Dark spot on left great toe is improving with no pain  Neurological: Neurological negative. Hematologic: Hematologic/lymphatic negative.  Psychiatric: Psychiatric negative.        Objective:   Objective   Vitals:   09/16/23 1055  BP: 125/80  Pulse: 78  Temp: 98.2 F (36.8 C)  TempSrc: Temporal  SpO2: 98%  Weight: 153 lb (69.4 kg)  Height: 5\' 11"  (1.803 m)   Body mass index is 21.34 kg/m.  Physical Exam HENT:     Head: Normocephalic.     Mouth/Throat:     Mouth: Mucous membranes are moist.  Eyes:     Pupils: Pupils are equal, round, and reactive to light.  Cardiovascular:     Rate and Rhythm: Normal rate.     Pulses:          Femoral pulses are 2+ on the left side.      Popliteal pulses are 2+ on the left side.       Dorsalis pedis pulses are 0 on the left side.       Posterior tibial pulses are 0 on the left side.     Comments: Strong left peroneal signal detected with Doppler at the ankle with very weak distal posterior tibial and dorsalis pedis signals Pulmonary:     Effort: Pulmonary effort is normal.  Abdominal:     General: Abdomen is flat.     Palpations: Abdomen is soft.  Musculoskeletal:     Left lower leg: No edema.     Comments: Well-healed right above-knee amputation  Skin:    Comments: Dark spot on left great toe appears stable  Neurological:     General: No focal deficit present.     Mental Status: He is alert.  Psychiatric:        Mood and Affect: Mood normal.     Data: LEFT       PSV cm/sRatioStenosisWaveform Comments  +-----------+--------+-----+--------+---------+--------+  CFA Mid    48                   triphasic          +-----------+--------+-----+--------+---------+--------+  DFA       43                   biphasic           +-----------+--------+-----+--------+---------+--------+  SFA Prox   55                   triphasic          +-----------+--------+-----+--------+---------+--------+  SFA Mid    37                   triphasic          +-----------+--------+-----+--------+---------+--------+  SFA Distal 38                   triphasic           +-----------+--------+-----+--------+---------+--------+  POP Prox   22                   triphasic          +-----------+--------+-----+--------+---------+--------+  ATA Mid    16                   biphasic           +-----------+--------+-----+--------+---------+--------+  PTA Distal 27                   biphasic           +-----------+--------+-----+--------+---------+--------+  PERO Distal40                   triphasic          +-----------+--------+-----+--------+---------+--------+        Summary:  Left: Patent lower extremity without obvious evidence of stenosis.    ABI Findings:  +---------+------------------+-----+--------+--------+  Right   Rt Pressure (mmHg)IndexWaveformComment   +---------+------------------+-----+--------+--------+  Brachial 139                                      +---------+------------------+-----+--------+--------+  PTA                                    bka       +---------+------------------+-----+--------+--------+  DP                                     bka       +---------+------------------+-----+--------+--------+  Great Toe                               bka       +---------+------------------+-----+--------+--------+   +---------+------------------+-----+--------+-------+  Left    Lt Pressure (mmHg)IndexWaveformComment  +---------+------------------+-----+--------+-------+  Brachial 128                                     +---------+------------------+-----+--------+-------+  PTA     130               0.94 biphasic         +---------+------------------+-----+--------+-------+  DP      255               1.83 biphasic         +---------+------------------+-----+--------+-------+  Great Toe37                0.27 Abnormal         +---------+------------------+-----+--------+-------+   +-------+-----------+-----------+------------+------------+   ABI/TBIToday's ABIToday's TBIPrevious ABIPrevious TBI  +-------+-----------+-----------+------------+------------+  Right BKA        BKA        [place]          0             +-------+-----------+-----------+------------+------------+  Left  [place]         0.27       [place]          0             +-------+-----------+-----------+------------+------------+          Summary:  Right:   BKA.  Left: Resting left ankle-brachial index indicates noncompressible left  lower extremity arteries. The left toe-brachial index is abnormal.      Assessment/Plan:    62 year old male with a history of right above-knee amputation now well-healed.  Recently was found to have discoloration of the left great toe after minor trauma while at physical therapy.  He is now almost 3 months out from right above-knee amputation healing and should be okay for prosthetic through the [place] and would do very well from the performance standpoint.  From a left lower extremity standpoint I will have him back in  3 months to recheck ABIs and if the dark spot has not resolved given his low toe pressures with likely single-vessel peroneal runoff would need to consider angiography for tibial revascularization.  We discussed protecting his left foot and monitoring for any worsening discoloration or pain.  Sure that we will see him back in 3 months.   The patient has a right Above Knee Amputation. The patient is well motivated to return to their prior functional status by utilizing a prosthesis to perform ADL's and maintain a healthy lifestyle. The patient has the physical and cognitive capacity to function with a prosthesis.   Functional Level: [employer] Community Ambulator: Has the ability or potential for ambulation with variable cadence, to traverse most environmental barriers, and may have vocational, therapeutic, or exercise activity that demands prosthetic utilization beyond simple locomotion. 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   Residual Limb History: The skin condition of the residual limb is well healed. The patient will continue to monitor the skin of the residual limb and follow hygiene instructions.  The patient is experiencing no pain related to amputation  Prosthetic Prescription Plan: Counseling and education regarding prosthetic management will be provided to the patient via a certified prosthetist. A multi-discipline team, including physical therapy, will manage the prosthetic fabrication, fitting and prosthetic gait training.    Maeola Harman MD [facility]

## 2023-09-21 ENCOUNTER — Encounter: Payer: Self-pay | Admitting: Cardiovascular Disease

## 2023-09-21 NOTE — Progress Notes (Signed)
Cardiology Office Note:    Date:  09/21/2023   ID:  Jose [name], DOB Aug 20, 1961, MRN [id number]  PCP:  [name]  Cardiologist:  Allena Katz  Electrophysiologist:  None   Referring MD: [name]   Chief Complaint  Patient presents with   Coronary Artery Disease        Hypertension          June 10, 2019    Pt placed on my schedule by mistake.   Has been seen by Dr. Eden Emms originally  and Dr. Rennis Golden, Dr. Katrinka Blazing  in June, 2020 .  I last saw him in 2013.   Jose [name] is a 62 y.o. male with a hx of cocaine abuse,  HTN .  He was recently admitted to the hospital with episodes of chest pain.  He had very low troponin level.  He was noted to have hypertension.  He is UDS was positive for cocaine.  Has cut his alcohol intake .  Was admitted with hyponatremia   ( was drinking lots of beer prior to admission )   September 08, 2019:  Jose [name] is seen today for follow-up visit.  He has a history of hypertension, cocaine abuse, alcohol abuse.  Not getting any exercise but is walking .  Has generalized fatigue  Echo from June, 2020 shows normal LV function , grade 1 diastolic dysfunction  BP is elevated today .  Only took amlodipine this am   Still eats liver pudding in the am.  Avoiding salt for the most part   He stopped his cardura because it caused dizziness.   Jan. 21, 2022 Jose [name] is seen today for follow up of his HTN, cocaine abuse and ETOH abuse He was admitted to the hospital in September, 2021.  He had angioplasty  to the distal right coronary artery.   No further episodes of CP .  Not getting much exercise  Walks some  His BP at home look good  Avoids salt   March 12, 2021: Was recently [facility] to [facility] with CP Ruled out for Mi Was started on Imdur.   Said it made him shake,  BP was 110 range,  Made him dizzy He stopped imdur and restarted the amlodipine .  Has cut out his salt ,   Did not sleep last night  - which  may explain his mildly HTN today  Getting some exercise , 3 times a week  Is not working , Is on disability  Has not been using cocaine recently    Oct. 30, 2024 Jose [name] is seen for his chest pain     Past Medical History:  Diagnosis Date   Alcohol abuse    CAD (coronary artery disease)    a. Reported MI in 2012 s/p 2 stents;  b. 08/2012 Cath: LM 20, LAD 20 diff ISR, jailed septal - 99%, LCX 30ost, RI large, min irregs, RCA 30p, 20-30 ISR-->Med Rx; c. 2017 Pt reports Neg [facility] @ [facility].   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   Gangrene of right foot (HCC) 04/24/2023   Hyperlipidemia    Hypertension    Hypertensive urgency 02/11/2021   Metabolic acidosis 05/11/2023   Noncompliance     Past Surgical History:  Procedure Laterality Date   ABDOMINAL AORTOGRAM W/LOWER EXTREMITY N/A 04/27/2023   Procedure: ABDOMINAL AORTOGRAM W/LOWER EXTREMITY;  Surgeon: Maeola Harman, MD;  Location: [facility] INVASIVE CV LAB;  Service: Cardiovascular;  Laterality: N/A;   AMPUTATION Right 05/30/2023   Procedure: AMPUTATION BELOW KNEE;  Surgeon: Maeola Harman, MD;  Location: [facility] OR;  Service: Vascular;  Laterality: Right;   AMPUTATION Right 07/01/2023   Procedure: RIGHT ABOVE KNEE AMPUTATION;  Surgeon: Leonie Douglas, MD;  Location: [facility] OR;  Service: Vascular;  Laterality: Right;   APPLICATION OF WOUND VAC Right 07/01/2023   Procedure: APPLICATION OF WOUND VAC;  Surgeon: Leonie Douglas, MD;  Location: [facility] OR;  Service: Vascular;  Laterality: Right;   CARDIAC CATHETERIZATION     CORONARY STENT PLACEMENT     FRACTURE SURGERY Right    hardware from right leg fracture   LEFT HEART CATH AND CORONARY ANGIOGRAPHY N/A 08/22/2020   Procedure: LEFT HEART CATH AND CORONARY ANGIOGRAPHY;   Surgeon: Lyn Records, MD;  Location: [facility] INVASIVE CV LAB;  Service: Cardiovascular;  Laterality: N/A;   LEFT HEART CATH AND CORONARY ANGIOGRAPHY N/A 01/28/2022   Procedure: LEFT HEART CATH AND CORONARY ANGIOGRAPHY;  Surgeon: Lennette Bihari, MD;  Location: [facility] INVASIVE CV LAB;  Service: Cardiovascular;  Laterality: N/A;   LEFT HEART CATHETERIZATION WITH CORONARY ANGIOGRAM Bilateral 08/25/2012   Procedure: LEFT HEART CATHETERIZATION WITH CORONARY ANGIOGRAM;  Surgeon: Kathleene Hazel, MD;  Location: [facility] CATH LAB;  Service: Cardiovascular;  Laterality: Bilateral;   PERIPHERAL INTRAVASCULAR LITHOTRIPSY  04/27/2023   Procedure: PERIPHERAL INTRAVASCULAR LITHOTRIPSY;  Surgeon: Maeola Harman, MD;  Location: [facility] INVASIVE CV LAB;  Service: Cardiovascular;;   PERIPHERAL VASCULAR BALLOON ANGIOPLASTY  04/27/2023   Procedure: PERIPHERAL VASCULAR BALLOON ANGIOPLASTY;  Surgeon: Maeola Harman, MD;  Location: [facility] INVASIVE CV LAB;  Service: Cardiovascular;;   TRANSMETATARSAL AMPUTATION Right 05/12/2023   Procedure: TRANSMETATARSAL AMPUTATION;  Surgeon: Chuck Hint, MD;  Location: [facility] OR;  Service: Vascular;  Laterality: Right;    Current Medications: No outpatient medications have been marked as taking for the 09/23/23 encounter (Office Visit) with Quinterious Walraven, Deloris Ping, MD.     Allergies:   Lisinopril-hydrochlorothiazide and Simvastatin   Social History   Socioeconomic History   Marital status: Married    Spouse name: Not on file   Number of children: Not on file   Years of education: Not on file   Highest education level: Not on file  Occupational History   Not on file  Tobacco Use   Smoking status: Former    Current packs/day: 0.00    Types: Cigarettes    Quit date: 11/25/2007    Years since quitting: 15.8   Smokeless tobacco: Never  Vaping Use   Vaping status: Never Used  Substance and Sexual Activity   Alcohol use: Yes    Comment: Used to drink heavily - says  currently 2 beers/day.   Drug use: Yes    Types: Marijuana    Comment: reports not using cocaine   Sexual activity: Yes    Birth control/protection: None  Other Topics Concern   Not on file  Social History Narrative   Lives in [place] by himself.  Does not work or routinely exercise.   Social Determinants of Health   Financial Resource Strain: Not on file  Food Insecurity: No Food Insecurity (06/26/2023)   Hunger Vital Sign    Worried About Running Out of Food in the Last Year: Never true  Ran Out of Food in the Last Year: Never true  Transportation Needs: No Transportation Needs (06/26/2023)   PRAPARE - Administrator, Civil Service (Medical): No    Lack of Transportation (Non-Medical): No  Physical Activity: Not on file  Stress: Not on file  Social Connections: Unknown (03/05/2023)   [facility]   Social Network    Social Network: Not on file     Family History: The patient's family history includes Heart disease in his mother.  ROS:   Please see the history of present illness.     All other systems reviewed and are negative.  EKGs/Labs/Other Studies Reviewed:    The following studies were reviewed today:   EKG:       Recent Labs: 10/10/2022: B Natriuretic Peptide 117.7 06/26/2023: ALT 24 06/27/2023: Magnesium 1.7; TSH 1.619 07/05/2023: BUN 16; Creatinine, Ser 1.35; Hemoglobin 8.3; Platelets 378; Potassium 3.9; Sodium 131  Recent Lipid Panel    Component Value Date/Time   CHOL 95 04/28/2023 0511   CHOL 148 12/14/2020 0849   TRIG 84 04/28/2023 0511   HDL 40 (L) 04/28/2023 0511   HDL 54 12/14/2020 0849   CHOLHDL 2.4 04/28/2023 0511   VLDL 17 04/28/2023 0511   LDLCALC 38 04/28/2023 0511   LDLCALC 73 12/14/2020 0849      Physical Exam: There were no vitals taken for this visit.  No BP recorded.  {Refresh Note OR Click here to enter BP  :1}***    GEN:  Well nourished, well developed in no acute distress HEENT:  Normal NECK: No JVD; No carotid bruits LYMPHATICS: No lymphadenopathy CARDIAC: RRR ***, no murmurs, rubs, gallops RESPIRATORY:  Clear to auscultation without rales, wheezing or rhonchi  ABDOMEN: Soft, non-tender, non-distended MUSCULOSKELETAL:  No edema; No deformity  SKIN: Warm and dry NEUROLOGIC:  Alert and oriented x 3      ASSESSMENT:    No diagnosis found. PLAN:     1.  CAD  :         2, HTN:        3.  Cocaine:    .     4.  Hyperlipidemia:        Medication Adjustments/Labs and Tests Ordered: Current medicines are reviewed at length with the patient today.  Concerns regarding medicines are outlined above.  No orders of the defined types were placed in this encounter.  No orders of the defined types were placed in this encounter.    There are no Patient Instructions on file for this visit.   Signed, Kristeen Miss, MD  09/21/2023 9:00 PM    [facility]

## 2023-09-23 ENCOUNTER — Ambulatory Visit: Payer: Medicare HMO | Attending: Cardiovascular Disease | Admitting: Cardiovascular Disease

## 2023-10-06 ENCOUNTER — Other Ambulatory Visit: Payer: Self-pay

## 2023-10-06 DIAGNOSIS — I739 Peripheral vascular disease, unspecified: Secondary | ICD-10-CM

## 2023-10-29 ENCOUNTER — Other Ambulatory Visit (HOSPITAL_COMMUNITY): Payer: Self-pay

## 2023-12-09 ENCOUNTER — Ambulatory Visit (HOSPITAL_COMMUNITY)
Admission: RE | Admit: 2023-12-09 | Discharge: 2023-12-09 | Disposition: A | Discharge status: Home / Self Care | Payer: No Typology Code available for payment source | Source: Ambulatory Visit | Attending: Vascular Surgery | Admitting: Vascular Surgery

## 2023-12-09 ENCOUNTER — Ambulatory Visit (INDEPENDENT_AMBULATORY_CARE_PROVIDER_SITE_OTHER): Payer: No Typology Code available for payment source | Admitting: Physician Assistant

## 2023-12-09 VITALS — BP 185/93 | HR 80 | Temp 98.2°F | Resp 18 | Ht 71.0 in | Wt 153.0 lb

## 2023-12-09 DIAGNOSIS — E785 Hyperlipidemia, unspecified: Secondary | ICD-10-CM | POA: Insufficient documentation

## 2023-12-09 DIAGNOSIS — E119 Type 2 diabetes mellitus without complications: Secondary | ICD-10-CM | POA: Insufficient documentation

## 2023-12-09 DIAGNOSIS — I739 Peripheral vascular disease, unspecified: Secondary | ICD-10-CM | POA: Insufficient documentation

## 2023-12-09 DIAGNOSIS — I10 Essential (primary) hypertension: Secondary | ICD-10-CM | POA: Diagnosis not present

## 2023-12-09 DIAGNOSIS — Z89611 Acquired absence of right leg above knee: Secondary | ICD-10-CM | POA: Insufficient documentation

## 2023-12-09 DIAGNOSIS — I2510 Atherosclerotic heart disease of native coronary artery without angina pectoris: Secondary | ICD-10-CM | POA: Diagnosis not present

## 2023-12-09 LAB — VAS US ABI WITH/WO TBI

## 2023-12-09 NOTE — Progress Notes (Signed)
 HISTORY AND PHYSICAL     CC:  follow up. Requesting Provider:  Clinic, [facility]  HPI: This is a 63 y.o. male who is here today for follow up for PAD.  Pt has hx of angiogram with angioplasty right popliteal artery 04/27/2023 by Dr. Vikki Graves.  He subsequently underwent right TMA, BKA and ultimately right AKA on 07/01/2023 by Dr. Edgardo Goodwill.  Pt was last seen 09/16/2023 and at that time, he was being evaluated for left great toe discoloration after minor trauma while at PT.  He was scheduled for 3 month appt with ABI and check toe discoloration.  If still present, it was felt with likely single-vessel peroneal runoff would need to consider angiography for tibial revascularization.   The pt returns today for follow up.  He states that his left great toe has improved.  He denies any rest pain or non healing wounds.  He had gotten his prosthesis but it had to be resized.  He is taking his asa/statin/plavix .    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   The pt is on a statin for cholesterol management.    The pt is on an aspirin .    Other AC:  plavix  The pt is on CCB, BB for hypertension.  The pt is  on medication for diabetes. Tobacco hx:  former  Pt does not have family hx of AAA.  Past Medical History:  Diagnosis Date   Alcohol  abuse    CAD (coronary artery disease)    a. Reported MI in 2012 s/p 2 stents;  b. 08/2012 Cath: LM 20, LAD 20 diff ISR, jailed septal - 99%, LCX 30ost, RI large, min irregs, RCA 30p, 20-30 ISR-->Med Rx; c. 2017 [facility].   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   Gangrene of right foot (HCC) 04/24/2023   Hyperlipidemia    Hypertension    Hypertensive urgency 02/11/2021   Metabolic acidosis 05/11/2023   Noncompliance     Past  Surgical History:  Procedure Laterality Date   ABDOMINAL AORTOGRAM W/LOWER EXTREMITY N/A 04/27/2023   Procedure: ABDOMINAL AORTOGRAM W/LOWER EXTREMITY;  Surgeon: Adine Hoof, MD;  Location: [facility] INVASIVE CV LAB;  Service: Cardiovascular;  Laterality: N/A;   AMPUTATION Right 05/30/2023   Procedure: AMPUTATION BELOW KNEE;  Surgeon: Adine Hoof, MD;  Location: [facility] OR;  Service: Vascular;  Laterality: Right;   AMPUTATION Right 07/01/2023   Procedure: RIGHT ABOVE KNEE AMPUTATION;  Surgeon: Carlene Che, MD;  Location: [facility] OR;  Service: Vascular;  Laterality: Right;   APPLICATION OF WOUND VAC Right 07/01/2023   Procedure: APPLICATION OF WOUND VAC;  Surgeon: Carlene Che, MD;  Location: [facility] OR;  Service: Vascular;  Laterality: Right;   CARDIAC CATHETERIZATION     CORONARY STENT PLACEMENT     FRACTURE SURGERY Right    hardware from right leg fracture   LEFT HEART CATH AND CORONARY ANGIOGRAPHY N/A 08/22/2020   Procedure: LEFT HEART CATH AND CORONARY ANGIOGRAPHY;  Surgeon: Arty Binning, MD;  Location: [facility] INVASIVE CV LAB;  Service: Cardiovascular;  Laterality: N/A;   LEFT HEART CATH AND CORONARY ANGIOGRAPHY N/A 01/28/2022   Procedure: LEFT HEART CATH AND CORONARY ANGIOGRAPHY;  Surgeon: Millicent Ally, MD;  Location: [facility] INVASIVE CV LAB;  Service: Cardiovascular;  Laterality: N/A;   LEFT HEART CATHETERIZATION WITH CORONARY ANGIOGRAM Bilateral 08/25/2012   Procedure: LEFT HEART CATHETERIZATION WITH CORONARY ANGIOGRAM;  Surgeon: Odie Benne, MD;  Location[facility] CATH LAB;  Service: Cardiovascular;  Laterality: Bilateral;   PERIPHERAL INTRAVASCULAR LITHOTRIPSY  04/27/2023   Procedure: PERIPHERAL INTRAVASCULAR LITHOTRIPSY;  Surgeon: Adine Hoof, MD;  Location: [facility] INVASIVE CV LAB;  Service: Cardiovascular;;   PERIPHERAL VASCULAR BALLOON ANGIOPLASTY  04/27/2023   Procedure: PERIPHERAL VASCULAR BALLOON ANGIOPLASTY;  Surgeon: Adine Hoof, MD;   Location: [facility] INVASIVE CV LAB;  Service: Cardiovascular;;   TRANSMETATARSAL AMPUTATION Right 05/12/2023   Procedure: TRANSMETATARSAL AMPUTATION;  Surgeon: Dannis Dy, MD;  Location: [facility] OR;  Service: Vascular;  Laterality: Right;    Allergies  Allergen Reactions   Lisinopril -Hydrochlorothiazide  Swelling and Other (See Comments)    Causes swelling of lips   Simvastatin Other (See Comments)    ALT (SGPT) level raised, Aspartate aminotransferase serum level raised    Current Outpatient Medications  Medication Sig Dispense Refill   acetaminophen  (TYLENOL ) 500 MG tablet Take 1,000 mg by mouth in the morning, at noon, and at bedtime.     albuterol  (PROVENTIL  HFA;VENTOLIN  HFA) 108 (90 BASE) MCG/ACT inhaler Inhale 2 puffs into the lungs every 6 (six) hours as needed for wheezing or shortness of breath.      amitriptyline  (ELAVIL ) 25 MG tablet Take 1 tablet (25 mg total) by mouth at bedtime. 30 tablet 1   amLODipine  (NORVASC ) 10 MG tablet Take 1 tablet (10 mg total) by mouth daily. 90 tablet 3   aspirin  EC 81 MG tablet Take 81 mg by mouth daily. Swallow whole.     atorvastatin  (LIPITOR ) 80 MG tablet Take 1 tablet (80 mg total) by mouth daily. (Patient taking differently: Take 80 mg by mouth every evening.) 30 tablet 2   benzonatate (TESSALON) 100 MG capsule Take 100 mg by mouth 3 (three) times daily as needed for cough.     bisacodyl  (DULCOLAX) 5 MG EC tablet Take 1 tablet (5 mg total) by mouth daily as needed for moderate constipation.     Brinzolamide -Brimonidine  1-0.2 % SUSP Place 1 drop into both eyes 3 (three) times daily.     carvedilol  (COREG ) 6.25 MG tablet Take 1 tablet (6.25 mg total) by mouth 2 (two) times daily with a meal.     clopidogrel  (PLAVIX ) 75 MG tablet Take 1 tablet (75 mg total) by mouth daily with breakfast. 30 tablet 5   cyanocobalamin  (VITAMIN B12) 500 MCG tablet Take 500 mcg by mouth daily.     cyclobenzaprine  (FLEXERIL ) 5 MG tablet Take 5 mg by mouth every 4  (four) hours as needed for muscle spasms.     Dextrose -Fructose-Sod Citrate 4.35-4.17-0.921 GM/15ML LIQD Take 15 cm by mouth as needed (15cm (1 tube) by mouth as needed for hypoglycemia for  glucose less than 70, repeat every 15 minutes if blood sugar less than 70.).     famotidine  (PEPCID ) 20 MG tablet Take 1 tablet (20 mg total) by mouth daily.     ferrous sulfate  325 (65 FE) MG EC tablet Take 325 mg by mouth every Monday, Wednesday, and Friday.     fluticasone -salmeterol (ADVAIR) 500-50 MCG/ACT AEPB Inhale 1 puff into the lungs in the morning and at bedtime.     folic acid  (FOLVITE ) 1 MG tablet Take 1 tablet (1 mg total) by mouth daily.     gabapentin  (NEURONTIN ) 100 MG capsule  Take 1 capsule (100 mg total) by mouth 3 (three) times daily. (Patient taking differently: Take 300 mg by mouth 3 (three) times daily.)     hydrOXYzine  (ATARAX ) 25 MG tablet Take 1 tablet (25 mg total) by mouth 3 (three) times daily as needed for itching or anxiety. (Patient taking differently: Take 25 mg by mouth 2 (two) times daily as needed for itching or anxiety.) 20 tablet 0   insulin  aspart (NOVOLOG ) 100 UNIT/ML injection Inject 0-9 Units into the skin 3 (three) times daily with meals. Sliding scale CBG 70 - 120: 0 units CBG 121 - 150: 1 unit,  CBG 151 - 200: 2 units,  CBG 201 - 250: 3 units,  CBG 251 - 300: 5 units,  CBG 301 - 350: 7 units,  CBG 351 - 400: 9 units   CBG > 400: 9 units and notify your MD     insulin  glargine (LANTUS ) 100 UNIT/ML injection Inject 0.25 mLs (25 Units total) into the skin daily.     Insulin  Syringe-Needle U-100 30G X 1/2" 0.5 ML MISC Use with Lantus  30 each 0   ipratropium-albuterol  (DUONEB) 0.5-2.5 (3) MG/3ML SOLN Take 3 mLs by nebulization every 6 (six) hours as needed (for shortness of breath and wheezing).     latanoprost  (XALATAN ) 0.005 % ophthalmic solution Place 1 drop into both eyes at bedtime.     magnesium  hydroxide (MILK OF MAGNESIA) 400 MG/5ML suspension Take 30 mLs by mouth 2  (two) times daily as needed for mild constipation.     Magnesium  Oxide 420 MG TABS Take 420 mg by mouth daily.     mirtazapine  (REMERON ) 15 MG tablet Take 1 tablet (15 mg total) by mouth at bedtime. 20 tablet 0   Multiple Vitamin (MULTIVITAMIN WITH MINERALS) TABS tablet Take 1 tablet by mouth daily.     naloxone  (NARCAN ) nasal spray 4 mg/0.1 mL Place 1 spray into the nose once. Instill 1 spray into one nostril as directed for opioid overdose, call 911 immediately, administer dose, then turn person on side if no response in 2-3 minutes or person responds but relapses. Repeat using a new spray device and into the other nostril.     nitroGLYCERIN  (NITROSTAT ) 0.4 MG SL tablet Place 1 tablet (0.4 mg total) under the tongue every 5 (five) minutes as needed for chest pain. 25 tablet 2   omeprazole (PRILOSEC) 40 MG capsule Take 40 mg by mouth daily.     oxyCODONE  (OXY IR/ROXICODONE ) 5 MG immediate release tablet Take 1 tablet (5 mg total) by mouth every 6 (six) hours as needed for severe pain. 15 tablet 0   polyethylene glycol (MIRALAX  / GLYCOLAX ) 17 g packet Take 17 g by mouth 2 (two) times daily. (Patient taking differently: Take 17 g by mouth daily as needed for mild constipation.) 14 each 0   senna-docusate (SENOKOT-S) 8.6-50 MG tablet Take 1 tablet by mouth 2 (two) times daily for 14 days. (Patient taking differently: Take 2 tablets by mouth 2 (two) times daily.)     simethicone  (MYLICON) 40 MG/0.6ML drops Take 0.6 mLs (40 mg total) by mouth every 6 (six) hours as needed for flatulence.     sodium bicarbonate  650 MG tablet Take 1 tablet (650 mg total) by mouth 2 (two) times daily.     thiamine  (VITAMIN B-1) 100 MG tablet Take 1 tablet (100 mg total) by mouth daily.     No current facility-administered medications for this visit.    Family History  Problem  Relation Age of Onset   Heart disease Mother        MI 24s    Social History   Socioeconomic History   Marital status: Married    Spouse  name: Not on file   Number of children: Not on file   Years of education: Not on file   Highest education level: Not on file  Occupational History   Not on file  Tobacco Use   Smoking status: Former    Current packs/day: 0.00    Types: Cigarettes    Quit date: 11/25/2007    Years since quitting: 16.0   Smokeless tobacco: Never  Vaping Use   Vaping status: Never Used  Substance and Sexual Activity   Alcohol  use: Yes    Comment: Used to drink heavily - says currently 2 beers/day.   Drug use: Yes    Types: Marijuana    Comment: reports not using cocaine   Sexual activity: Yes    Birth control/protection: None  Other Topics Concern   Not on file  Social History Narrative   Lives in [place] by himself.  Does not work or routinely exercise.   Social Drivers of Corporate investment banker Strain: Not on file  Food Insecurity: No Food Insecurity (06/26/2023)   Hunger Vital Sign    Worried About Running Out of Food in the Last Year: Never true    Ran Out of Food in the Last Year: Never true  Transportation Needs: No Transportation Needs (06/26/2023)   PRAPARE - Administrator, Civil Service (Medical): No    Lack of Transportation (Non-Medical): No  Physical Activity: Not on file  Stress: Not on file  Social Connections: Unknown (03/05/2023)   [facility]   Social Network    Social Network: Not on file  Intimate Partner Violence: Not At Risk (06/26/2023)   Humiliation, Afraid, Rape, and Kick questionnaire    Fear of Current or Ex-Partner: No    Emotionally Abused: No    Physically Abused: No    Sexually Abused: No     REVIEW OF SYSTEMS:   [X]  denotes positive finding, [ ]  denotes negative finding Cardiac  Comments:  Chest pain or chest pressure:    Shortness of breath upon exertion:    Short of breath when lying flat:    Irregular heart rhythm:        Vascular    Pain in calf, thigh, or hip brought on by ambulation:    Pain in feet  at night that wakes you up from your sleep:     Blood clot in your veins:    Leg swelling:         Pulmonary    Oxygen  at home:    Productive cough:     Wheezing:         Neurologic    Sudden weakness in arms or legs:     Sudden numbness in arms or legs:     Sudden onset of difficulty speaking or slurred speech:    Temporary loss of vision in one eye:     Problems with dizziness:         Gastrointestinal    Blood in stool:     Vomited blood:         Genitourinary    Burning when urinating:     Blood in urine:        Psychiatric    Major depression:  Hematologic    Bleeding problems:    Problems with blood clotting too easily:        Skin    Rashes or ulcers:        Constitutional    Fever or chills:      PHYSICAL EXAMINATION:  Today's Vitals   12/09/23 1442  BP: (!) 185/93  Pulse: 80  Resp: 18  Temp: 98.2 F (36.8 C)  TempSrc: Temporal  SpO2: 96%  Weight: 153 lb (69.4 kg)  Height: 5\' 11"  (1.803 m)   Body mass index is 21.34 kg/m.   General:  WDWN in NAD; vital signs documented above Gait: Not observed HENT: WNL, normocephalic Pulmonary: normal non-labored breathing , without wheezing Cardiac: regular HR, without carotid bruits Abdomen: soft, NT; aortic pulse is not palpable Skin: without rashes Vascular Exam/Pulses:  Right Left  Radial 2+ (normal) 2+ (normal)  DP AKA Brisk doppler  PT AKA Brisk doppler  Peroneal AKA Brisk doppler   Extremities: without ischemic changes, without Gangrene , without cellulitis; without open wounds Musculoskeletal: no muscle wasting or atrophy  Neurologic: A&O X 3 Psychiatric:  The pt has Normal affect.   Non-Invasive Vascular Imaging:   ABI's/TBI's on 12/09/2023: Right:  AKA Left:  [place]/0.44 - Great toe pressure: 77   Previous ABI's/TBI's on 09/01/2023: Right:  AKA Left:  0.94 ([place])/0.27 - Great toe pressure:  37  Previous arterial duplex on  09/01/2023: +-----------+--------+-----+--------+---------+--------+  LEFT      PSV cm/sRatioStenosisWaveform Comments  +-----------+--------+-----+--------+---------+--------+  CFA Mid    48                   triphasic          +-----------+--------+-----+--------+---------+--------+  DFA       43                   biphasic           +-----------+--------+-----+--------+---------+--------+  SFA Prox   55                   triphasic          +-----------+--------+-----+--------+---------+--------+  SFA Mid    37                   triphasic          +-----------+--------+-----+--------+---------+--------+  SFA Distal 38                   triphasic          +-----------+--------+-----+--------+---------+--------+  POP Prox   22                   triphasic          +-----------+--------+-----+--------+---------+--------+  ATA Mid    16                   biphasic           +-----------+--------+-----+--------+---------+--------+  PTA Distal 27                   biphasic           +-----------+--------+-----+--------+---------+--------+  PERO Distal40                   triphasic          +-----------+--------+-----+--------+---------+--------+   Summary:  Left: Patent lower extremity without obvious evidence of stenosis.     ASSESSMENT/PLAN:: 63 y.o. male here for follow up  for PAD with hx of angiogram with angioplasty right popliteal artery 04/27/2023 by Dr. Vikki Graves.  He subsequently underwent right TMA, BKA and ultimately right AKA on 07/01/2023 by Dr. Edgardo Goodwill.   -pt doing well and discoloration on left great toe resolved.   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  -he should be getting his prosthesis next week. -continue asa/statin/plavix  -pt will f/u in 6 months with ABI.  He knows to call sooner if any issues before then.     Maryanna Smart, [facility] [phone]  Clinic MD:   Vikki Graves

## 2024-01-05 ENCOUNTER — Other Ambulatory Visit: Payer: Self-pay

## 2024-01-05 DIAGNOSIS — I739 Peripheral vascular disease, unspecified: Secondary | ICD-10-CM

## 2024-02-10 DIAGNOSIS — F1021 Alcohol dependence, in remission: Secondary | ICD-10-CM | POA: Diagnosis not present

## 2024-02-10 DIAGNOSIS — J4489 Other specified chronic obstructive pulmonary disease: Secondary | ICD-10-CM | POA: Diagnosis not present

## 2024-02-10 DIAGNOSIS — H409 Unspecified glaucoma: Secondary | ICD-10-CM | POA: Diagnosis not present

## 2024-02-10 DIAGNOSIS — Z87891 Personal history of nicotine dependence: Secondary | ICD-10-CM | POA: Diagnosis not present

## 2024-02-10 DIAGNOSIS — E1042 Type 1 diabetes mellitus with diabetic polyneuropathy: Secondary | ICD-10-CM | POA: Diagnosis not present

## 2024-02-10 DIAGNOSIS — Z7984 Long term (current) use of oral hypoglycemic drugs: Secondary | ICD-10-CM | POA: Diagnosis not present

## 2024-02-10 DIAGNOSIS — I129 Hypertensive chronic kidney disease with stage 1 through stage 4 chronic kidney disease, or unspecified chronic kidney disease: Secondary | ICD-10-CM | POA: Diagnosis not present

## 2024-02-10 DIAGNOSIS — K219 Gastro-esophageal reflux disease without esophagitis: Secondary | ICD-10-CM | POA: Diagnosis not present

## 2024-02-10 DIAGNOSIS — I25119 Atherosclerotic heart disease of native coronary artery with unspecified angina pectoris: Secondary | ICD-10-CM | POA: Diagnosis not present

## 2024-02-10 DIAGNOSIS — G621 Alcoholic polyneuropathy: Secondary | ICD-10-CM | POA: Diagnosis not present

## 2024-02-10 DIAGNOSIS — M1990 Unspecified osteoarthritis, unspecified site: Secondary | ICD-10-CM | POA: Diagnosis not present

## 2024-02-10 DIAGNOSIS — M5430 Sciatica, unspecified side: Secondary | ICD-10-CM | POA: Diagnosis not present

## 2024-02-10 DIAGNOSIS — N189 Chronic kidney disease, unspecified: Secondary | ICD-10-CM | POA: Diagnosis not present

## 2024-02-10 DIAGNOSIS — D509 Iron deficiency anemia, unspecified: Secondary | ICD-10-CM | POA: Diagnosis not present

## 2024-02-10 DIAGNOSIS — E785 Hyperlipidemia, unspecified: Secondary | ICD-10-CM | POA: Diagnosis not present

## 2024-02-10 DIAGNOSIS — F325 Major depressive disorder, single episode, in full remission: Secondary | ICD-10-CM | POA: Diagnosis not present

## 2024-02-10 DIAGNOSIS — K5900 Constipation, unspecified: Secondary | ICD-10-CM | POA: Diagnosis not present

## 2024-02-10 DIAGNOSIS — I4430 Unspecified atrioventricular block: Secondary | ICD-10-CM | POA: Diagnosis not present

## 2024-02-10 DIAGNOSIS — E1022 Type 1 diabetes mellitus with diabetic chronic kidney disease: Secondary | ICD-10-CM | POA: Diagnosis not present

## 2024-05-17 ENCOUNTER — Other Ambulatory Visit (HOSPITAL_COMMUNITY): Payer: Self-pay

## 2024-05-18 NOTE — Anesthesia Postprocedure Evaluation (Signed)
 Patient: Legrand Venora Moats  Procedure Summary     Date: 05/18/24 Room / Location: [facility] - ENDOSCOPY   Anesthesia Start: 1327 Anesthesia Stop: 1423   Procedure: UPPER ENDOSCOPIC ULTRASOUND Diagnosis: Pancreatic mass (CMD)   Scheduled Providers: Teena DELENA Mcgregor, MD; Maurilio Mort Cheryl Daring, CRNA Responsible Provider: Rea Niels Roosevelt, MD   Anesthesia Type: general ASA Status: 3       Anesthesia Type: general  Vitals Value Taken Time  BP 159/109 05/18/24 14:55  Temp 97.3 F (36.3 C) 05/18/24 14:55  Pulse 59 05/18/24 14:57  Resp 13 05/18/24 14:56  SpO2 99 % 05/18/24 14:57  Vitals shown include unfiled device data.  There were no known notable events for this encounter.  Anesthesia Post Evaluation  Final anesthesia type: general Patient location during evaluation: PACU Patient participation: Patient participated Level of consciousness: awake and alert Pain score: pain well controlled (patient comfortable/resting) Pain management: adequately controlled during entire PACU stay Post-op nausea and vomiting?: none Post-op vital signs: post-procedure vital signs are stable Patient temperature: Normothermic Cardiovascular status: hemodynamically stable Respiratory status: Stable, room air, spontaneous Hydration status: adequately hydrated Post-op disposition: Home Anesthesia post-op complications?:no complications

## 2024-05-25 ENCOUNTER — Ambulatory Visit (INDEPENDENT_AMBULATORY_CARE_PROVIDER_SITE_OTHER): Payer: No Typology Code available for payment source

## 2024-05-25 ENCOUNTER — Ambulatory Visit (HOSPITAL_COMMUNITY)
Admission: RE | Admit: 2024-05-25 | Discharge: 2024-05-25 | Disposition: A | Discharge status: Home / Self Care | Payer: No Typology Code available for payment source | Source: Ambulatory Visit | Attending: Vascular Surgery | Admitting: Vascular Surgery

## 2024-05-25 ENCOUNTER — Encounter: Payer: Self-pay | Admitting: Physician Assistant

## 2024-05-25 VITALS — BP 145/67 | HR 37 | Temp 98.0°F | Ht 71.0 in | Wt 160.0 lb

## 2024-05-25 DIAGNOSIS — I739 Peripheral vascular disease, unspecified: Secondary | ICD-10-CM

## 2024-05-25 DIAGNOSIS — Z89611 Acquired absence of right leg above knee: Secondary | ICD-10-CM

## 2024-05-25 LAB — VAS US ABI WITH/WO TBI

## 2024-05-25 NOTE — Progress Notes (Signed)
 HISTORY AND PHYSICAL     CC:  follow up. Requesting Provider:  Laymon Glendale DEL, PA  HPI: This is a 63 y.o. male who is here today for follow up for PAD.  Pt has hx of  angiogram with angioplasty right popliteal artery 04/27/2023 by Dr. Sheree.  He subsequently underwent right TMA, BKA and ultimately right AKA on 07/01/2023 by Dr. Magda.   Pt was last seen 12/09/2023 and at that time, he had a left great toe wound that had improved.  He was not having any rest pain or non healing wounds. He had gotten his prosthesis but it had to be resized.    He is an Investment banker, operational and served 7 years.   The pt returns today for follow up.  He states overall he is doing well.  He denies any rest pain or non healing wounds.  He states that he uses lotion on his legs and foot for the dry skin.    He states they found a cyst in his stomach and he is being worked up for that.    [place]    The pt is on a statin for cholesterol management.    The pt is on an aspirin .    Other AC:  Plavix  The pt is on CCB for hypertension.  The pt is  on diabetic medication. Tobacco hx:  former  Pt does not have family hx of AAA.  Past Medical History:  Diagnosis Date   Alcohol  abuse    CAD (coronary artery disease)    a. Reported MI in 2012 s/p 2 stents;  b. 08/2012 Cath: LM 20, LAD 20 diff ISR, jailed septal - 99%, LCX 30ost, RI large, min irregs, RCA 30p, 20-30 ISR-->Med Rx; c. 2017 [facility]   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   Gangrene of right foot (HCC) 04/24/2023   Hyperlipidemia    Hypertension    Hypertensive urgency 02/11/2021   Metabolic acidosis 05/11/2023   Noncompliance    Peripheral arterial disease (HCC)     Past  Surgical History:  Procedure Laterality Date   ABDOMINAL AORTOGRAM W/LOWER EXTREMITY N/A 04/27/2023   Procedure: ABDOMINAL AORTOGRAM W/LOWER EXTREMITY;  Surgeon: Sheree Penne Bruckner, MD;  Location: [facility] INVASIVE CV LAB;  Service: Cardiovascular;  Laterality: N/A;   AMPUTATION Right 05/30/2023   Procedure: AMPUTATION BELOW KNEE;  Surgeon: Sheree Penne Bruckner, MD;  Location: [facility] OR;  Service: Vascular;  Laterality: Right;   AMPUTATION Right 07/01/2023   Procedure: RIGHT ABOVE KNEE AMPUTATION;  Surgeon: Magda Debby SAILOR, MD;  Location: [facility] OR;  Service: Vascular;  Laterality: Right;   APPLICATION OF WOUND VAC Right 07/01/2023   Procedure: APPLICATION OF WOUND VAC;  Surgeon: Magda Debby SAILOR, MD;  Location: [facility] OR;  Service: Vascular;  Laterality: Right;   CARDIAC CATHETERIZATION     CORONARY STENT PLACEMENT     FRACTURE SURGERY Right    hardware from right leg fracture   LEFT HEART CATH AND CORONARY ANGIOGRAPHY N/A 08/22/2020   Procedure: LEFT HEART CATH AND CORONARY ANGIOGRAPHY;  Surgeon: Claudene Victory ORN, MD;  Location: [facility] INVASIVE CV LAB;  Service: Cardiovascular;  Laterality: N/A;   LEFT HEART  CATH AND CORONARY ANGIOGRAPHY N/A 01/28/2022   Procedure: LEFT HEART CATH AND CORONARY ANGIOGRAPHY;  Surgeon: Burnard Debby LABOR, MD;  Location: [facility] INVASIVE CV LAB;  Service: Cardiovascular;  Laterality: N/A;   LEFT HEART CATHETERIZATION WITH CORONARY ANGIOGRAM Bilateral 08/25/2012   Procedure: LEFT HEART CATHETERIZATION WITH CORONARY ANGIOGRAM;  Surgeon: Lonni JONETTA Cash, MD;  Location: [facility] CATH LAB;  Service: Cardiovascular;  Laterality: Bilateral;   PERIPHERAL INTRAVASCULAR LITHOTRIPSY  04/27/2023   Procedure: PERIPHERAL INTRAVASCULAR LITHOTRIPSY;  Surgeon: Sheree Penne Lonni, MD;  [facility]VAS[facility] CV LAB;  Service: Cardiovascular;;   PERIPHERAL VASCULAR BALLOON ANGIOPLASTY  04/27/2023   Procedure: PERIPHERAL VASCULAR BALLOON ANGIOPLASTY;  Surgeon: Sheree Penne Lonni, MD;   Location: [facility] INVASIVE CV LAB;  Service: Cardiovascular;;   TRANSMETATARSAL AMPUTATION Right 05/12/2023   Procedure: TRANSMETATARSAL AMPUTATION;  Surgeon: Eliza Lonni RAMAN, MD;  Location: [facility] OR;  Service: Vascular;  Laterality: Right;    Allergies  Allergen Reactions   Lisinopril -Hydrochlorothiazide  Swelling and Other (See Comments)    Causes swelling of lips   Simvastatin Other (See Comments)    ALT (SGPT) level raised, Aspartate aminotransferase serum level raised    Current Outpatient Medications  Medication Sig Dispense Refill   acetaminophen  (TYLENOL ) 500 MG tablet Take 1,000 mg by mouth in the morning, at noon, and at bedtime.     albuterol  (PROVENTIL  HFA;VENTOLIN  HFA) 108 (90 BASE) MCG/ACT inhaler Inhale 2 puffs into the lungs every 6 (six) hours as needed for wheezing or shortness of breath.      amitriptyline  (ELAVIL ) 25 MG tablet Take 1 tablet (25 mg total) by mouth at bedtime. 30 tablet 1   amLODipine  (NORVASC ) 10 MG tablet Take 1 tablet (10 mg total) by mouth daily. 90 tablet 3   aspirin  EC 81 MG tablet Take 81 mg by mouth daily. Swallow whole.     atorvastatin  (LIPITOR ) 80 MG tablet Take 1 tablet (80 mg total) by mouth daily. (Patient taking differently: Take 80 mg by mouth every evening.) 30 tablet 2   benzonatate (TESSALON) 100 MG capsule Take 100 mg by mouth 3 (three) times daily as needed for cough.     bisacodyl  (DULCOLAX) 5 MG EC tablet Take 1 tablet (5 mg total) by mouth daily as needed for moderate constipation.     Brinzolamide -Brimonidine  1-0.2 % SUSP Place 1 drop into both eyes 3 (three) times daily.     carvedilol  (COREG ) 6.25 MG tablet Take 1 tablet (6.25 mg total) by mouth 2 (two) times daily with a meal.     clopidogrel  (PLAVIX ) 75 MG tablet Take 1 tablet (75 mg total) by mouth daily with breakfast. 30 tablet 5   cyanocobalamin  (VITAMIN B12) 500 MCG tablet Take 500 mcg by mouth daily.     cyclobenzaprine  (FLEXERIL ) 5 MG tablet Take 5 mg by mouth every 4  (four) hours as needed for muscle spasms.     Dextrose -Fructose-Sod Citrate 4.35-4.17-0.921 GM/15ML LIQD Take 15 cm by mouth as needed (15cm (1 tube) by mouth as needed for hypoglycemia for  glucose less than 70, repeat every 15 minutes if blood sugar less than 70.).     famotidine  (PEPCID ) 20 MG tablet Take 1 tablet (20 mg total) by mouth daily.     ferrous sulfate  325 (65 FE) MG EC tablet Take 325 mg by mouth every Monday, Wednesday, and Friday.     fluticasone -salmeterol (ADVAIR) 500-50 MCG/ACT AEPB Inhale 1 puff into the lungs in the morning and at bedtime.     folic acid  (  FOLVITE ) 1 MG tablet Take 1 tablet (1 mg total) by mouth daily.     gabapentin  (NEURONTIN ) 100 MG capsule Take 1 capsule (100 mg total) by mouth 3 (three) times daily. (Patient taking differently: Take 300 mg by mouth 3 (three) times daily.)     hydrOXYzine  (ATARAX ) 25 MG tablet Take 1 tablet (25 mg total) by mouth 3 (three) times daily as needed for itching or anxiety. (Patient taking differently: Take 25 mg by mouth 2 (two) times daily as needed for itching or anxiety.) 20 tablet 0   insulin  aspart (NOVOLOG ) 100 UNIT/ML injection Inject 0-9 Units into the skin 3 (three) times daily with meals. Sliding scale CBG 70 - 120: 0 units CBG 121 - 150: 1 unit,  CBG 151 - 200: 2 units,  CBG 201 - 250: 3 units,  CBG 251 - 300: 5 units,  CBG 301 - 350: 7 units,  CBG 351 - 400: 9 units   CBG > 400: 9 units and notify your MD     insulin  glargine (LANTUS ) 100 UNIT/ML injection Inject 0.25 mLs (25 Units total) into the skin daily.     Insulin  Syringe-Needle U-100 30G X 1/2 0.5 ML MISC Use with Lantus  30 each 0   ipratropium-albuterol  (DUONEB) 0.5-2.5 (3) MG/3ML SOLN Take 3 mLs by nebulization every 6 (six) hours as needed (for shortness of breath and wheezing).     latanoprost  (XALATAN ) 0.005 % ophthalmic solution Place 1 drop into both eyes at bedtime.     magnesium  hydroxide (MILK OF MAGNESIA) 400 MG/5ML suspension Take 30 mLs by mouth 2  (two) times daily as needed for mild constipation.     Magnesium  Oxide 420 MG TABS Take 420 mg by mouth daily.     mirtazapine  (REMERON ) 15 MG tablet Take 1 tablet (15 mg total) by mouth at bedtime. 20 tablet 0   Multiple Vitamin (MULTIVITAMIN WITH MINERALS) TABS tablet Take 1 tablet by mouth daily.     naloxone  (NARCAN ) nasal spray 4 mg/0.1 mL Place 1 spray into the nose once. Instill 1 spray into one nostril as directed for opioid overdose, call 911 immediately, administer dose, then turn person on side if no response in 2-3 minutes or person responds but relapses. Repeat using a new spray device and into the other nostril.     nitroGLYCERIN  (NITROSTAT ) 0.4 MG SL tablet Place 1 tablet (0.4 mg total) under the tongue every 5 (five) minutes as needed for chest pain. 25 tablet 2   omeprazole (PRILOSEC) 40 MG capsule Take 40 mg by mouth daily.     oxyCODONE  (OXY IR/ROXICODONE ) 5 MG immediate release tablet Take 1 tablet (5 mg total) by mouth every 6 (six) hours as needed for severe pain. 15 tablet 0   polyethylene glycol (MIRALAX  / GLYCOLAX ) 17 g packet Take 17 g by mouth 2 (two) times daily. (Patient taking differently: Take 17 g by mouth daily as needed for mild constipation.) 14 each 0   senna-docusate (SENOKOT-S) 8.6-50 MG tablet Take 1 tablet by mouth 2 (two) times daily for 14 days. (Patient taking differently: Take 2 tablets by mouth 2 (two) times daily.)     simethicone  (MYLICON) 40 MG/0.6ML drops Take 0.6 mLs (40 mg total) by mouth every 6 (six) hours as needed for flatulence.     sodium bicarbonate  650 MG tablet Take 1 tablet (650 mg total) by mouth 2 (two) times daily.     thiamine  (VITAMIN B-1) 100 MG tablet Take 1 tablet (100 mg  total) by mouth daily.     No current facility-administered medications for this visit.    Family History  Problem Relation Age of Onset   Heart disease Mother        MI 38s    Social History   Socioeconomic History   Marital status: Married    Spouse  name: Not on file   Number of children: Not on file   Years of education: Not on file   Highest education level: Not on file  Occupational History   Not on file  Tobacco Use   Smoking status: Former    Current packs/day: 0.00    Types: Cigarettes    Quit date: 11/25/2007    Years since quitting: 16.5   Smokeless tobacco: Never  Vaping Use   Vaping status: Never Used  Substance and Sexual Activity   Alcohol  use: Yes    Comment: Used to drink heavily - says currently 2 beers/day.   Drug use: Yes    Types: Marijuana    Comment: reports not using cocaine   Sexual activity: Yes    Birth control/protection: None  Other Topics Concern   Not on file  Social History Narrative   Lives in [place] by himself.  Does not work or routinely exercise.   Social Drivers of Corporate investment banker Strain: Not on file  Food Insecurity: No Food Insecurity (06/26/2023)   Hunger Vital Sign    Worried About Running Out of Food in the Last Year: Never true    Ran Out of Food in the Last Year: Never true  Transportation Needs: No Transportation Needs (06/26/2023)   PRAPARE - Administrator, Civil Service (Medical): No    Lack of Transportation (Non-Medical): No  Physical Activity: Not on file  Stress: Not on file  Social Connections: Unknown (03/05/2023)   Received from [facility]   Social Network    Social Network: Not on file  Intimate Partner Violence: Not At Risk (06/26/2023)   Humiliation, Afraid, Rape, and Kick questionnaire    Fear of Current or Ex-Partner: No    Emotionally Abused: No    Physically Abused: No    Sexually Abused: No     REVIEW OF SYSTEMS:   [X]  denotes positive finding, [ ]  denotes negative finding Cardiac  Comments:  Chest pain or chest pressure:    Shortness of breath upon exertion:    Short of breath when lying flat:    Irregular heart rhythm:        Vascular    Pain in calf, thigh, or hip brought on by ambulation:    Pain in feet at night that  wakes you up from your sleep:     Blood clot in your veins:    Leg swelling:         Pulmonary    Oxygen  at home:    Productive cough:     Wheezing:         Neurologic    Sudden weakness in arms or legs:     Sudden numbness in arms or legs:     Sudden onset of difficulty speaking or slurred speech:    Temporary loss of vision in one eye:     Problems with dizziness:         Gastrointestinal    Blood in stool:     Vomited blood:         Genitourinary    Burning when urinating:  Blood in urine:        Psychiatric    Major depression:         Hematologic    Bleeding problems:    Problems with blood clotting too easily:        Skin    Rashes or ulcers:        Constitutional    Fever or chills:      PHYSICAL EXAMINATION:  Today's Vitals   05/25/24 1331  BP: (!) 145/67  Pulse: (!) 37  Temp: 98 F (36.7 C)  TempSrc: Temporal  SpO2: 96%  Weight: 160 lb (72.6 kg)  Height: 5' 11 (1.803 m)   Body mass index is 22.32 kg/m.   General:  WDWN in NAD; vital signs documented above Gait: Not observed HENT: WNL, normocephalic Pulmonary: normal non-labored breathing , without wheezing Cardiac: regular HR, without carotid bruits Abdomen: soft, NT; aortic pulse is not palpable Skin: without rashes Vascular Exam/Pulses: Brisk monophasic doppler flow left DP/PT and biphasic brisk peroneal Extremities: without ischemic changes, without Gangrene , without cellulitis; without open wounds Musculoskeletal: no muscle wasting or atrophy  Neurologic: A&O X 3 Psychiatric:  The pt has Normal affect.   Non-Invasive Vascular Imaging:   ABI's/TBI's on 05/25/2024: Right:  AKA Left:  [place]/0 - Great toe pressure: 0   Previous ABI's/TBI's on 12/09/2023: Right:  AKA Left:  [place]/0.44 - Great toe pressure: 77    ASSESSMENT/PLAN:: 63 y.o. male here for follow up for PAD with hx of angiogram with angioplasty right popliteal artery 04/27/2023 by Dr. Sheree.  He subsequently underwent  right TMA, BKA and ultimately right AKA on 07/01/2023 by Dr. Magda.    -on ABI today, toe pressure is zero, which is decreased from previous exam.  He is not having any rest pain or non healing wounds.  On exam, he has brisk monophasic doppler flow in the left DP and PT and brisk biphasic flow in the peroneal artery -continue asa/statin/plavix  -discussed protecting his foot and not going barefooted.   -pt will f/u in one year with ABI.  He knows to call sooner if he has any issues before then.    Lucie Apt, [facility] [phone]  Clinic MD:   Serene on call MD

## 2024-08-05 DIAGNOSIS — E1122 Type 2 diabetes mellitus with diabetic chronic kidney disease: Secondary | ICD-10-CM | POA: Diagnosis not present

## 2024-08-05 DIAGNOSIS — N179 Acute kidney failure, unspecified: Secondary | ICD-10-CM | POA: Diagnosis not present

## 2024-08-05 DIAGNOSIS — I129 Hypertensive chronic kidney disease with stage 1 through stage 4 chronic kidney disease, or unspecified chronic kidney disease: Secondary | ICD-10-CM | POA: Diagnosis not present

## 2024-08-05 DIAGNOSIS — E1121 Type 2 diabetes mellitus with diabetic nephropathy: Secondary | ICD-10-CM | POA: Diagnosis not present

## 2024-08-05 DIAGNOSIS — N183 Chronic kidney disease, stage 3 unspecified: Secondary | ICD-10-CM | POA: Diagnosis not present

## 2024-08-05 DIAGNOSIS — E559 Vitamin D deficiency, unspecified: Secondary | ICD-10-CM | POA: Diagnosis not present

## 2024-08-05 DIAGNOSIS — N2581 Secondary hyperparathyroidism of renal origin: Secondary | ICD-10-CM | POA: Diagnosis not present

## 2024-08-05 DIAGNOSIS — N1831 Chronic kidney disease, stage 3a: Secondary | ICD-10-CM | POA: Diagnosis not present

## 2024-08-13 ENCOUNTER — Emergency Department (HOSPITAL_COMMUNITY)
Admission: EM | Admit: 2024-08-13 | Discharge: 2024-08-14 | Disposition: A | Discharge status: Other Acute Inpatient Hospital | Attending: Emergency Medicine | Admitting: Emergency Medicine

## 2024-08-13 ENCOUNTER — Encounter (HOSPITAL_COMMUNITY): Payer: Self-pay | Admitting: *Deleted

## 2024-08-13 ENCOUNTER — Other Ambulatory Visit: Payer: Self-pay

## 2024-08-13 ENCOUNTER — Emergency Department (HOSPITAL_COMMUNITY)

## 2024-08-13 DIAGNOSIS — N189 Chronic kidney disease, unspecified: Secondary | ICD-10-CM | POA: Insufficient documentation

## 2024-08-13 DIAGNOSIS — Z7951 Long term (current) use of inhaled steroids: Secondary | ICD-10-CM | POA: Diagnosis not present

## 2024-08-13 DIAGNOSIS — I2510 Atherosclerotic heart disease of native coronary artery without angina pectoris: Secondary | ICD-10-CM | POA: Insufficient documentation

## 2024-08-13 DIAGNOSIS — Z794 Long term (current) use of insulin: Secondary | ICD-10-CM | POA: Diagnosis not present

## 2024-08-13 DIAGNOSIS — J449 Chronic obstructive pulmonary disease, unspecified: Secondary | ICD-10-CM | POA: Diagnosis not present

## 2024-08-13 DIAGNOSIS — C259 Malignant neoplasm of pancreas, unspecified: Secondary | ICD-10-CM | POA: Diagnosis not present

## 2024-08-13 DIAGNOSIS — Z7982 Long term (current) use of aspirin: Secondary | ICD-10-CM | POA: Diagnosis not present

## 2024-08-13 DIAGNOSIS — R1011 Right upper quadrant pain: Secondary | ICD-10-CM | POA: Diagnosis present

## 2024-08-13 DIAGNOSIS — E119 Type 2 diabetes mellitus without complications: Secondary | ICD-10-CM | POA: Insufficient documentation

## 2024-08-13 DIAGNOSIS — R17 Unspecified jaundice: Secondary | ICD-10-CM

## 2024-08-13 HISTORY — DX: Malignant neoplasm of pancreas, unspecified: C25.9

## 2024-08-13 LAB — URINALYSIS, ROUTINE W REFLEX MICROSCOPIC
Bacteria, UA: NONE SEEN
Bilirubin Urine: NEGATIVE
Glucose, UA: NEGATIVE mg/dL
Hgb urine dipstick: NEGATIVE
Ketones, ur: NEGATIVE mg/dL
Leukocytes,Ua: NEGATIVE
Nitrite: NEGATIVE
Protein, ur: 30 mg/dL — AB
Specific Gravity, Urine: 1.009 (ref 1.005–1.030)
pH: 5 (ref 5.0–8.0)

## 2024-08-13 LAB — CBC
HCT: 28.8 % — ABNORMAL LOW (ref 39.0–52.0)
Hemoglobin: 10 g/dL — ABNORMAL LOW (ref 13.0–17.0)
MCH: 25.1 pg — ABNORMAL LOW (ref 26.0–34.0)
MCHC: 34.7 g/dL (ref 30.0–36.0)
MCV: 72.4 fL — ABNORMAL LOW (ref 80.0–100.0)
Platelets: 183 K/uL (ref 150–400)
RBC: 3.98 MIL/uL — ABNORMAL LOW (ref 4.22–5.81)
RDW: 18.1 % — ABNORMAL HIGH (ref 11.5–15.5)
WBC: 6 K/uL (ref 4.0–10.5)
nRBC: 0 % (ref 0.0–0.2)

## 2024-08-13 LAB — COMPREHENSIVE METABOLIC PANEL WITH GFR
ALT: 205 U/L — ABNORMAL HIGH (ref 0–44)
AST: 199 U/L — ABNORMAL HIGH (ref 15–41)
Albumin: 2.7 g/dL — ABNORMAL LOW (ref 3.5–5.0)
Alkaline Phosphatase: 934 U/L — ABNORMAL HIGH (ref 38–126)
Anion gap: 10 (ref 5–15)
BUN: 21 mg/dL (ref 8–23)
CO2: 20 mmol/L — ABNORMAL LOW (ref 22–32)
Calcium: 8.7 mg/dL — ABNORMAL LOW (ref 8.9–10.3)
Chloride: 105 mmol/L (ref 98–111)
Creatinine, Ser: 1.69 mg/dL — ABNORMAL HIGH (ref 0.61–1.24)
GFR, Estimated: 44 mL/min — ABNORMAL LOW (ref >60)
Glucose, Bld: 168 mg/dL — ABNORMAL HIGH (ref 70–99)
Potassium: 3.6 mmol/L (ref 3.5–5.1)
Sodium: 135 mmol/L (ref 135–145)
Total Bilirubin: 5.2 mg/dL — ABNORMAL HIGH (ref 0.0–1.2)
Total Protein: 6.2 g/dL — ABNORMAL LOW (ref 6.5–8.1)

## 2024-08-13 MED ORDER — HYDROMORPHONE HCL 1 MG/ML IJ SOLN
0.5000 mg | Freq: Once | INTRAMUSCULAR | Status: AC
  Administered 2024-08-14: 0.5 mg via INTRAVENOUS
  Filled 2024-08-13: qty 1

## 2024-08-13 MED ORDER — ONDANSETRON HCL 4 MG/2ML IJ SOLN
4.0000 mg | Freq: Once | INTRAMUSCULAR | Status: AC
  Administered 2024-08-14: 4 mg via INTRAVENOUS
  Filled 2024-08-13: qty 2

## 2024-08-13 NOTE — ED Triage Notes (Signed)
 Pt here via [facility] for LUQ pain.  Recently dx with pancreatic ca (due to start chemo next week).  States yesterday entire abdomen started cramping.  States has not had a bm since yesterday and normally has one every day.  Ems vs 158/90 Hr 64 Cbg 182 Sats 100% ra  Given 4 mg zofran 

## 2024-08-13 NOTE — ED Provider Notes (Signed)
 [place] EMERGENCY DEPARTMENT AT [facility] Provider Note   CSN: [id number] Arrival date & time: 08/13/24  2052     Patient presents with: Abdominal Pain   Jose [name] is a 63 y.o. male with recent diagnosis of pancreatic cancer (starting chemo next week), DMT2, CKD, COPD,CAD, PAD on ASA +Plavix  wh opresents with new LUQ pain and abdominal cramping. No BM today, unusual for him. {Add pertinent medical, surgical, social history, OB history to HPI:32947} HPI     Prior to Admission medications   Medication Sig Start Date End Date Taking? Authorizing Provider  acetaminophen  (TYLENOL ) 500 MG tablet Take 1,000 mg by mouth in the morning, at noon, and at bedtime.    [provider]  albuterol  (PROVENTIL  HFA;VENTOLIN  HFA) 108 (90 BASE) MCG/ACT inhaler Inhale 2 puffs into the lungs every 6 (six) hours as needed for wheezing or shortness of breath.     [provider]  amitriptyline  (ELAVIL ) 25 MG tablet Take 1 tablet (25 mg total) by mouth at bedtime. 05/15/23   [name], MD  amLODipine  (NORVASC ) 10 MG tablet Take 1 tablet (10 mg total) by mouth daily. 03/12/21   [name], [name], MD  aspirin  EC 81 MG tablet Take 81 mg by mouth daily. Swallow whole.    [provider]  atorvastatin  (LIPITOR ) 80 MG tablet Take 1 tablet (80 mg total) by mouth daily. Patient taking differently: Take 80 mg by mouth every evening. 01/30/22   Goodrich, Callie E, PA-C  benzonatate (TESSALON) 100 MG capsule Take 100 mg by mouth 3 (three) times daily as needed for cough.    [provider]  bisacodyl  (DULCOLAX) 5 MG EC tablet Take 1 tablet (5 mg total) by mouth daily as needed for moderate constipation. 06/11/23   [name], Ripudeep MARLA, MD  Brinzolamide -Brimonidine  1-0.2 % SUSP Place 1 drop into both eyes 3 (three) times daily.    [provider]  carvedilol  (COREG ) 6.25 MG tablet Take 1 tablet (6.25 mg total) by mouth 2 (two) times daily with a meal. 07/09/23    Darci Pore, MD  clopidogrel  (PLAVIX ) 75 MG tablet Take 1 tablet (75 mg total) by mouth daily with breakfast. 01/30/22   Goodrich, Callie E, PA-C  cyanocobalamin  (VITAMIN B12) 500 MCG tablet Take 500 mcg by mouth daily.    [provider]  cyclobenzaprine  (FLEXERIL ) 5 MG tablet Take 5 mg by mouth every 4 (four) hours as needed for muscle spasms.    [provider]  Dextrose -Fructose-Sod Citrate 4.35-4.17-0.921 GM/15ML LIQD Take 15 cm by mouth as needed (15cm (1 tube) by mouth as needed for hypoglycemia for  glucose less than 70, repeat every 15 minutes if blood sugar less than 70.).    [provider]  famotidine  (PEPCID ) 20 MG tablet Take 1 tablet (20 mg total) by mouth daily. 06/12/23   Davia Nydia MARLA, MD  ferrous sulfate  325 (65 FE) MG EC tablet Take 325 mg by mouth every Monday, Wednesday, and Friday.    [provider]  fluticasone -salmeterol (ADVAIR) 500-50 MCG/ACT AEPB Inhale 1 puff into the lungs in the morning and at bedtime. 04/30/23   Christobal Guadalajara, MD  folic acid  (FOLVITE ) 1 MG tablet Take 1 tablet (1 mg total) by mouth daily. 05/16/23   [name], MD  gabapentin  (NEURONTIN ) 100 MG capsule Take 1 capsule (100 mg total) by mouth 3 (three) times daily. Patient taking differently: Take 300 mg by mouth 3 (three) times daily. 06/11/23  [name], [name], MD  hydrOXYzine  (ATARAX ) 25 MG tablet Take 1 tablet (25 mg total) by mouth 3 (three) times daily as needed for itching or anxiety. Patient taking differently: Take 25 mg by mouth 2 (two) times daily as needed for itching or anxiety. 05/15/23   [name], MD  insulin  aspart (NOVOLOG ) 100 UNIT/ML injection Inject 0-9 Units into the skin 3 (three) times daily with meals. Sliding scale CBG 70 - 120: 0 units CBG 121 - 150: 1 unit,  CBG 151 - 200: 2 units,  CBG 201 - 250: 3 units,  CBG 251 - 300: 5 units,  CBG 301 - 350: 7 units,  CBG 351 - 400: 9 units   CBG > 400: 9 units and notify your MD  06/11/23   [name], [name], MD  insulin  glargine (LANTUS ) 100 UNIT/ML injection Inject 0.25 mLs (25 Units total) into the skin daily. 07/09/23   Darci Pore, MD  Insulin  Syringe-Needle U-100 30G X 1/2 0.5 ML MISC Use with Lantus  05/15/23   [name], MD  ipratropium-albuterol  (DUONEB) 0.5-2.5 (3) MG/3ML SOLN Take 3 mLs by nebulization every 6 (six) hours as needed (for shortness of breath and wheezing).    [provider]  latanoprost  (XALATAN ) 0.005 % ophthalmic solution Place 1 drop into both eyes at bedtime. 06/11/23   [name], [name], MD  magnesium  hydroxide (MILK OF MAGNESIA) 400 MG/5ML suspension Take 30 mLs by mouth 2 (two) times daily as needed for mild constipation.    [provider]  Magnesium  Oxide 420 MG TABS Take 420 mg by mouth daily.    [provider]  mirtazapine  (REMERON ) 15 MG tablet Take 1 tablet (15 mg total) by mouth at bedtime. 06/11/23   [name], [name], MD  Multiple Vitamin (MULTIVITAMIN WITH MINERALS) TABS tablet Take 1 tablet by mouth daily.    [provider]  naloxone  (NARCAN ) nasal spray 4 mg/0.1 mL Place 1 spray into the nose once. Instill 1 spray into one nostril as directed for opioid overdose, call 911 immediately, administer dose, then turn person on side if no response in 2-3 minutes or person responds but relapses. Repeat using a new spray device and into the other nostril.    [provider]  nitroGLYCERIN  (NITROSTAT ) 0.4 MG SL tablet Place 1 tablet (0.4 mg total) under the tongue every 5 (five) minutes as needed for chest pain. 01/29/22   Goodrich, Callie E, PA-C  omeprazole (PRILOSEC) 40 MG capsule Take 40 mg by mouth daily.    [provider]  oxyCODONE  (OXY IR/ROXICODONE ) 5 MG immediate release tablet Take 1 tablet (5 mg total) by mouth every 6 (six) hours as needed for severe pain. 07/09/23   Darci Pore, MD  polyethylene glycol (MIRALAX  / GLYCOLAX ) 17 g packet Take 17 g by mouth 2  (two) times daily. Patient taking differently: Take 17 g by mouth daily as needed for mild constipation. 04/30/23   Christobal Guadalajara, MD  simethicone  (MYLICON) 40 MG/0.6ML drops Take 0.6 mLs (40 mg total) by mouth every 6 (six) hours as needed for flatulence. 06/11/23   [name], [name], MD  sodium bicarbonate  650 MG tablet Take 1 tablet (650 mg total) by mouth 2 (two) times daily. 06/11/23   [name], Ripudeep POUR, MD  thiamine  (VITAMIN B-1) 100 MG tablet Take 1 tablet (100 mg total) by mouth daily. 05/16/23   [name], MD    Allergies: Lisinopril -hydrochlorothiazide  and Simvastatin    Review of Systems  Updated Vital Signs BP (!) 149/80 (BP Location: Left Arm)   Pulse 66   Temp 98 F (36.7 C) (Oral)   Resp 17   Ht 5' 11 (1.803 m)   Wt 72.6 kg   SpO2 100%   BMI 22.32 kg/m   Physical Exam  (all labs ordered are listed, but only abnormal results are displayed) Labs Reviewed  CBC - Abnormal; Notable for the following components:      Result Value   RBC 3.98 (*)    Hemoglobin 10.0 (*)    HCT 28.8 (*)    MCV 72.4 (*)    MCH 25.1 (*)    RDW 18.1 (*)    All other components within normal limits  LIPASE, BLOOD  COMPREHENSIVE METABOLIC PANEL WITH GFR  URINALYSIS, ROUTINE W REFLEX MICROSCOPIC    EKG: None  Radiology: No results found.  {Document cardiac monitor, telemetry assessment procedure when appropriate:32947} Procedures   Medications Ordered in the ED - No data to display    {Click here for ABCD2, HEART and other calculators REFRESH Note before signing:1}                              Medical Decision Making Amount and/or Complexity of Data Reviewed Labs: ordered.   ***  {Document critical care time when appropriate  Document review of labs and clinical decision tools ie CHADS2VASC2, etc  Document your independent review of radiology images and any outside records  Document your discussion with family members, caretakers and with consultants  Document social  determinants of health affecting pt's care  Document your decision making why or why not admission, treatments were needed:32947:::1}   Final diagnoses:  None    ED Discharge Orders     None

## 2024-08-14 ENCOUNTER — Emergency Department (HOSPITAL_COMMUNITY)

## 2024-08-14 DIAGNOSIS — K831 Obstruction of bile duct: Secondary | ICD-10-CM | POA: Diagnosis not present

## 2024-08-14 LAB — LIPASE, BLOOD: Lipase: 15 U/L (ref 11–51)

## 2024-08-14 MED ORDER — HYDROMORPHONE HCL 1 MG/ML IJ SOLN
0.5000 mg | Freq: Once | INTRAMUSCULAR | Status: AC
  Administered 2024-08-14: 0.5 mg via INTRAVENOUS
  Filled 2024-08-14: qty 1

## 2024-08-14 MED ORDER — IOHEXOL 350 MG/ML SOLN
75.0000 mL | Freq: Once | INTRAVENOUS | Status: AC | PRN
  Administered 2024-08-14: 75 mL via INTRAVENOUS

## 2024-08-14 NOTE — ED Notes (Signed)
 Pt has bed at [facility] Room: [facility] bed R491 [phone] Dr. Corinthia Gambler On the list for [facility] to transfer possibly after 7am [phone]

## 2024-08-14 NOTE — ED Provider Notes (Signed)
 8:12 AM Patient talking with transport providers, aware of need for transfer to [facility] for ongoing evaluation of his pain in the context of pancreatic cancer, recent stent placement.   Jose Charleston, MD 08/14/24 [phone]

## 2024-08-15 DIAGNOSIS — E1142 Type 2 diabetes mellitus with diabetic polyneuropathy: Secondary | ICD-10-CM | POA: Diagnosis not present

## 2024-08-15 DIAGNOSIS — E1122 Type 2 diabetes mellitus with diabetic chronic kidney disease: Secondary | ICD-10-CM | POA: Diagnosis not present

## 2024-08-15 DIAGNOSIS — C259 Malignant neoplasm of pancreas, unspecified: Secondary | ICD-10-CM | POA: Diagnosis not present

## 2024-08-15 DIAGNOSIS — K831 Obstruction of bile duct: Secondary | ICD-10-CM | POA: Diagnosis not present

## 2024-08-15 DIAGNOSIS — Z794 Long term (current) use of insulin: Secondary | ICD-10-CM | POA: Diagnosis not present

## 2024-08-15 DIAGNOSIS — E1165 Type 2 diabetes mellitus with hyperglycemia: Secondary | ICD-10-CM | POA: Diagnosis not present

## 2024-08-15 DIAGNOSIS — N1832 Chronic kidney disease, stage 3b: Secondary | ICD-10-CM | POA: Diagnosis not present

## 2024-08-15 DIAGNOSIS — Z9689 Presence of other specified functional implants: Secondary | ICD-10-CM | POA: Diagnosis not present

## 2024-08-16 DIAGNOSIS — Z7984 Long term (current) use of oral hypoglycemic drugs: Secondary | ICD-10-CM | POA: Diagnosis not present

## 2024-08-16 DIAGNOSIS — C259 Malignant neoplasm of pancreas, unspecified: Secondary | ICD-10-CM | POA: Diagnosis not present

## 2024-08-16 DIAGNOSIS — E1142 Type 2 diabetes mellitus with diabetic polyneuropathy: Secondary | ICD-10-CM | POA: Diagnosis not present

## 2024-08-16 DIAGNOSIS — Z794 Long term (current) use of insulin: Secondary | ICD-10-CM | POA: Diagnosis not present

## 2024-08-16 DIAGNOSIS — Z9889 Other specified postprocedural states: Secondary | ICD-10-CM | POA: Diagnosis not present

## 2024-08-16 DIAGNOSIS — E1165 Type 2 diabetes mellitus with hyperglycemia: Secondary | ICD-10-CM | POA: Diagnosis not present

## 2024-08-16 DIAGNOSIS — N1832 Chronic kidney disease, stage 3b: Secondary | ICD-10-CM | POA: Diagnosis not present

## 2024-08-16 DIAGNOSIS — E1122 Type 2 diabetes mellitus with diabetic chronic kidney disease: Secondary | ICD-10-CM | POA: Diagnosis not present

## 2024-08-16 DIAGNOSIS — K831 Obstruction of bile duct: Secondary | ICD-10-CM | POA: Diagnosis not present

## 2024-08-17 DIAGNOSIS — Z794 Long term (current) use of insulin: Secondary | ICD-10-CM | POA: Diagnosis not present

## 2024-08-17 DIAGNOSIS — Z7984 Long term (current) use of oral hypoglycemic drugs: Secondary | ICD-10-CM | POA: Diagnosis not present

## 2024-08-17 DIAGNOSIS — Z4659 Encounter for fitting and adjustment of other gastrointestinal appliance and device: Secondary | ICD-10-CM | POA: Diagnosis not present

## 2024-08-17 DIAGNOSIS — K838 Other specified diseases of biliary tract: Secondary | ICD-10-CM | POA: Diagnosis not present

## 2024-08-17 DIAGNOSIS — I129 Hypertensive chronic kidney disease with stage 1 through stage 4 chronic kidney disease, or unspecified chronic kidney disease: Secondary | ICD-10-CM | POA: Diagnosis not present

## 2024-08-17 DIAGNOSIS — E1165 Type 2 diabetes mellitus with hyperglycemia: Secondary | ICD-10-CM | POA: Diagnosis not present

## 2024-08-17 DIAGNOSIS — N1832 Chronic kidney disease, stage 3b: Secondary | ICD-10-CM | POA: Diagnosis not present

## 2024-08-17 DIAGNOSIS — K831 Obstruction of bile duct: Secondary | ICD-10-CM | POA: Diagnosis not present

## 2024-08-17 DIAGNOSIS — E1142 Type 2 diabetes mellitus with diabetic polyneuropathy: Secondary | ICD-10-CM | POA: Diagnosis not present

## 2024-08-17 DIAGNOSIS — E1122 Type 2 diabetes mellitus with diabetic chronic kidney disease: Secondary | ICD-10-CM | POA: Diagnosis not present

## 2024-08-18 DIAGNOSIS — Z9889 Other specified postprocedural states: Secondary | ICD-10-CM | POA: Diagnosis not present

## 2024-08-18 DIAGNOSIS — K831 Obstruction of bile duct: Secondary | ICD-10-CM | POA: Diagnosis not present

## 2024-08-18 DIAGNOSIS — E1122 Type 2 diabetes mellitus with diabetic chronic kidney disease: Secondary | ICD-10-CM | POA: Diagnosis not present

## 2024-08-18 DIAGNOSIS — Z7984 Long term (current) use of oral hypoglycemic drugs: Secondary | ICD-10-CM | POA: Diagnosis not present

## 2024-08-18 DIAGNOSIS — C259 Malignant neoplasm of pancreas, unspecified: Secondary | ICD-10-CM | POA: Diagnosis not present

## 2024-08-18 DIAGNOSIS — E1165 Type 2 diabetes mellitus with hyperglycemia: Secondary | ICD-10-CM | POA: Diagnosis not present

## 2024-08-18 DIAGNOSIS — I129 Hypertensive chronic kidney disease with stage 1 through stage 4 chronic kidney disease, or unspecified chronic kidney disease: Secondary | ICD-10-CM | POA: Diagnosis not present

## 2024-08-18 DIAGNOSIS — E1142 Type 2 diabetes mellitus with diabetic polyneuropathy: Secondary | ICD-10-CM | POA: Diagnosis not present

## 2024-08-18 DIAGNOSIS — Z794 Long term (current) use of insulin: Secondary | ICD-10-CM | POA: Diagnosis not present

## 2024-08-18 DIAGNOSIS — N1832 Chronic kidney disease, stage 3b: Secondary | ICD-10-CM | POA: Diagnosis not present

## 2024-08-19 ENCOUNTER — Other Ambulatory Visit (HOSPITAL_COMMUNITY): Payer: Self-pay

## 2024-10-05 DIAGNOSIS — E1165 Type 2 diabetes mellitus with hyperglycemia: Secondary | ICD-10-CM | POA: Diagnosis not present

## 2024-10-05 DIAGNOSIS — E1122 Type 2 diabetes mellitus with diabetic chronic kidney disease: Secondary | ICD-10-CM | POA: Diagnosis not present

## 2024-10-05 DIAGNOSIS — E1142 Type 2 diabetes mellitus with diabetic polyneuropathy: Secondary | ICD-10-CM | POA: Diagnosis not present

## 2024-10-05 DIAGNOSIS — N1832 Chronic kidney disease, stage 3b: Secondary | ICD-10-CM | POA: Diagnosis not present

## 2024-10-05 DIAGNOSIS — Z794 Long term (current) use of insulin: Secondary | ICD-10-CM | POA: Diagnosis not present

## 2024-11-09 ENCOUNTER — Emergency Department (HOSPITAL_COMMUNITY)
Admission: EM | Admit: 2024-11-09 | Discharge: 2024-11-09 | Disposition: A | Discharge status: Home / Self Care | Attending: Emergency Medicine | Admitting: Emergency Medicine

## 2024-11-09 ENCOUNTER — Encounter (HOSPITAL_COMMUNITY): Payer: Self-pay

## 2024-11-09 ENCOUNTER — Other Ambulatory Visit: Payer: Self-pay

## 2024-11-09 ENCOUNTER — Emergency Department (HOSPITAL_COMMUNITY)

## 2024-11-09 DIAGNOSIS — M79672 Pain in left foot: Secondary | ICD-10-CM | POA: Diagnosis present

## 2024-11-09 DIAGNOSIS — Z7901 Long term (current) use of anticoagulants: Secondary | ICD-10-CM | POA: Diagnosis not present

## 2024-11-09 DIAGNOSIS — J449 Chronic obstructive pulmonary disease, unspecified: Secondary | ICD-10-CM | POA: Insufficient documentation

## 2024-11-09 DIAGNOSIS — I2510 Atherosclerotic heart disease of native coronary artery without angina pectoris: Secondary | ICD-10-CM | POA: Insufficient documentation

## 2024-11-09 DIAGNOSIS — M109 Gout, unspecified: Secondary | ICD-10-CM | POA: Diagnosis not present

## 2024-11-09 DIAGNOSIS — Z7982 Long term (current) use of aspirin: Secondary | ICD-10-CM | POA: Diagnosis not present

## 2024-11-09 DIAGNOSIS — E119 Type 2 diabetes mellitus without complications: Secondary | ICD-10-CM | POA: Diagnosis not present

## 2024-11-09 DIAGNOSIS — Z794 Long term (current) use of insulin: Secondary | ICD-10-CM | POA: Insufficient documentation

## 2024-11-09 LAB — CBC WITH DIFFERENTIAL/PLATELET
Abs Immature Granulocytes: 0.01 K/uL (ref 0.00–0.07)
Basophils Absolute: 0 K/uL (ref 0.0–0.1)
Basophils Relative: 1 %
Eosinophils Absolute: 0.1 K/uL (ref 0.0–0.5)
Eosinophils Relative: 3 %
HCT: 24.6 % — ABNORMAL LOW (ref 39.0–52.0)
Hemoglobin: 8.6 g/dL — ABNORMAL LOW (ref 13.0–17.0)
Immature Granulocytes: 0 %
Lymphocytes Relative: 51 %
Lymphs Abs: 1.7 K/uL (ref 0.7–4.0)
MCH: 26.3 pg (ref 26.0–34.0)
MCHC: 35 g/dL (ref 30.0–36.0)
MCV: 75.2 fL — ABNORMAL LOW (ref 80.0–100.0)
Monocytes Absolute: 0.2 K/uL (ref 0.1–1.0)
Monocytes Relative: 6 %
Neutro Abs: 1.3 K/uL — ABNORMAL LOW (ref 1.7–7.7)
Neutrophils Relative %: 39 %
Platelets: 192 K/uL (ref 150–400)
RBC: 3.27 MIL/uL — ABNORMAL LOW (ref 4.22–5.81)
RDW: 19.9 % — ABNORMAL HIGH (ref 11.5–15.5)
WBC: 3.4 K/uL — ABNORMAL LOW (ref 4.0–10.5)
nRBC: 0 % (ref 0.0–0.2)

## 2024-11-09 LAB — URINALYSIS, W/ REFLEX TO CULTURE (INFECTION SUSPECTED)
Bilirubin Urine: NEGATIVE
Glucose, UA: NEGATIVE mg/dL
Hgb urine dipstick: NEGATIVE
Ketones, ur: NEGATIVE mg/dL
Leukocytes,Ua: NEGATIVE
Nitrite: NEGATIVE
Protein, ur: 30 mg/dL — AB
Specific Gravity, Urine: 1.016 (ref 1.005–1.030)
pH: 5 (ref 5.0–8.0)

## 2024-11-09 LAB — COMPREHENSIVE METABOLIC PANEL WITH GFR
ALT: 25 U/L (ref 0–44)
AST: 28 U/L (ref 15–41)
Albumin: 3.6 g/dL (ref 3.5–5.0)
Alkaline Phosphatase: 144 U/L — ABNORMAL HIGH (ref 38–126)
Anion gap: 11 (ref 5–15)
BUN: 24 mg/dL — ABNORMAL HIGH (ref 8–23)
CO2: 21 mmol/L — ABNORMAL LOW (ref 22–32)
Calcium: 9.4 mg/dL (ref 8.9–10.3)
Chloride: 103 mmol/L (ref 98–111)
Creatinine, Ser: 1.55 mg/dL — ABNORMAL HIGH (ref 0.61–1.24)
GFR, Estimated: 49 mL/min — ABNORMAL LOW (ref >60)
Glucose, Bld: 95 mg/dL (ref 70–99)
Potassium: 3.3 mmol/L — ABNORMAL LOW (ref 3.5–5.1)
Sodium: 136 mmol/L (ref 135–145)
Total Bilirubin: 0.3 mg/dL (ref 0.0–1.2)
Total Protein: 6.2 g/dL — ABNORMAL LOW (ref 6.5–8.1)

## 2024-11-09 LAB — I-STAT CHEM 8, ED
BUN: 23 mg/dL (ref 8–23)
Calcium, Ion: 1.25 mmol/L (ref 1.15–1.40)
Chloride: 103 mmol/L (ref 98–111)
Creatinine, Ser: 1.3 mg/dL — ABNORMAL HIGH (ref 0.61–1.24)
Glucose, Bld: 97 mg/dL (ref 70–99)
HCT: 26 % — ABNORMAL LOW (ref 39.0–52.0)
Hemoglobin: 8.8 g/dL — ABNORMAL LOW (ref 13.0–17.0)
Potassium: 3.2 mmol/L — ABNORMAL LOW (ref 3.5–5.1)
Sodium: 138 mmol/L (ref 135–145)
TCO2: 20 mmol/L — ABNORMAL LOW (ref 22–32)

## 2024-11-09 LAB — TYPE AND SCREEN
ABO/RH(D): A POS
Antibody Screen: NEGATIVE

## 2024-11-09 LAB — I-STAT CG4 LACTIC ACID, ED: Lactic Acid, Venous: 0.5 mmol/L (ref 0.5–1.9)

## 2024-11-09 MED ORDER — OXYCODONE HCL 5 MG PO TABS
5.0000 mg | ORAL_TABLET | Freq: Once | ORAL | Status: AC
  Administered 2024-11-09: 19:00:00 5 mg via ORAL
  Filled 2024-11-09 (×2): qty 1

## 2024-11-09 MED ORDER — OXYCODONE-ACETAMINOPHEN 5-325 MG PO TABS: 1.0000 | ORAL_TABLET | Freq: Four times a day (QID) | ORAL | 0 refills | Status: DC | PRN

## 2024-11-09 MED ORDER — COLCHICINE 0.6 MG PO TABS
0.6000 mg | ORAL_TABLET | Freq: Once | ORAL | Status: AC
  Administered 2024-11-09: 19:00:00 0.6 mg via ORAL
  Filled 2024-11-09: qty 1

## 2024-11-09 MED ORDER — COLCHICINE 0.6 MG PO TABS: 0.6000 mg | ORAL_TABLET | Freq: Every day | ORAL | 0 refills | Status: DC

## 2024-11-09 MED ORDER — MORPHINE SULFATE (PF) 2 MG/ML IV SOLN
4.0000 mg | Freq: Once | INTRAVENOUS | Status: AC
  Administered 2024-11-09: 16:00:00 4 mg via INTRAVENOUS
  Filled 2024-11-09: qty 2

## 2024-11-09 NOTE — ED Notes (Signed)
 Patient refused PO pain meds endorsing he needs a pain shot. PA notified.

## 2024-11-09 NOTE — Discharge Instructions (Addendum)
 It was a pleasure taking care of you today. You were seen in the Emergency Department for left toe pain. Your work-up was reassuring. Your CT/Xray/Labs showed no evidence of infection.  However, your symptoms and history are consistent with gout.  As discussed, I am sending you home with a medication called colchicine  to treat your gout flareup.  You will need to take this medication once a day for the next 10 days.  You did receive your first dose here in the emergency department.  I have also given you a prescription for Percocet, which is an opioid medication to treat the pain.  Opioids can cause dependence, so exercise caution when taking this medication. Refer to the attached documentation for further management of your symptoms. Follow up with your PCP if your symptoms continue.  Please return to the ER if you experience chest pain, trouble breathing, intractable nausea/vomiting or any other life threatening illnesses.

## 2024-11-09 NOTE — ED Provider Notes (Signed)
 " [place] EMERGENCY DEPARTMENT AT [facility] Provider Note   CSN: [id number] Arrival date & time: 11/09/24  1243     Patient presents with: Foot Pain   Jose [name] is a 63 y.o. male with past medical history of diabetes, dyslipidemia, COPD, CAD, hyperlipidemia, pancreatic cancer currently on chemo, a BKA on the right, who presents emergency department for evaluation of left foot pain and swelling.  Patient reports pain in his left big toe that he noticed this morning.  He states he was going to get chemotherapy, when he was told he should come to the emergency department for further evaluation.  He also reports swelling to the left foot and states he is concerned he may have cellulitis.   Foot Pain       Prior to Admission medications  Medication Sig Start Date End Date Taking? Authorizing Provider  colchicine  0.6 MG tablet Take 1 tablet (0.6 mg total) by mouth daily. 11/09/24  Yes Demia Viera, Marry RAMAN, PA-C  oxyCODONE -acetaminophen  (PERCOCET/ROXICET) 5-325 MG tablet Take 1 tablet by mouth every 6 (six) hours as needed for severe pain (pain score 7-10). 11/09/24  Yes Neely Cecena, Marry RAMAN, PA-C  acetaminophen  (TYLENOL ) 500 MG tablet Take 1,000 mg by mouth in the morning, at noon, and at bedtime.    [provider]  albuterol  (PROVENTIL  HFA;VENTOLIN  HFA) 108 (90 BASE) MCG/ACT inhaler Inhale 2 puffs into the lungs every 6 (six) hours as needed for wheezing or shortness of breath.     [provider]  amitriptyline  (ELAVIL ) 25 MG tablet Take 1 tablet (25 mg total) by mouth at bedtime. 05/15/23   [name], MD  amLODipine  (NORVASC ) 10 MG tablet Take 1 tablet (10 mg total) by mouth daily. 03/12/21   [name], [name], MD  aspirin  EC 81 MG tablet Take 81 mg by mouth daily. Swallow whole.    [provider]  atorvastatin  (LIPITOR ) 80 MG tablet Take 1 tablet (80 mg total) by mouth daily. Patient taking differently: Take 80 mg by mouth every  evening. 01/30/22   Goodrich, Callie E, PA-C  benzonatate (TESSALON) 100 MG capsule Take 100 mg by mouth 3 (three) times daily as needed for cough.    [provider]  bisacodyl  (DULCOLAX) 5 MG EC tablet Take 1 tablet (5 mg total) by mouth daily as needed for moderate constipation. 06/11/23   [name], Ripudeep MARLA, MD  Brinzolamide -Brimonidine  1-0.2 % SUSP Place 1 drop into both eyes 3 (three) times daily.    [provider]  carvedilol  (COREG ) 6.25 MG tablet Take 1 tablet (6.25 mg total) by mouth 2 (two) times daily with a meal. 07/09/23   Darci Pore, MD  clopidogrel  (PLAVIX ) 75 MG tablet Take 1 tablet (75 mg total) by mouth daily with breakfast. 01/30/22   Goodrich, Callie E, PA-C  cyanocobalamin  (VITAMIN B12) 500 MCG tablet Take 500 mcg by mouth daily.    [provider]  cyclobenzaprine  (FLEXERIL ) 5 MG tablet Take 5 mg by mouth every 4 (four) hours as needed for muscle spasms.    [provider]  Dextrose -Fructose-Sod Citrate 4.35-4.17-0.921 GM/15ML LIQD Take 15 cm by mouth as needed (15cm (1 tube) by mouth as needed for hypoglycemia for  glucose less than 70, repeat every 15 minutes if blood sugar less than 70.).    [provider]  famotidine  (PEPCID ) 20 MG tablet Take 1 tablet (20 mg total) by mouth daily. 06/12/23   [name], Ripudeep K, MD  ferrous sulfate   325 (65 FE) MG EC tablet Take 325 mg by mouth every Monday, Wednesday, and Friday.    [provider]  fluticasone -salmeterol (ADVAIR) 500-50 MCG/ACT AEPB Inhale 1 puff into the lungs in the morning and at bedtime. 04/30/23   Christobal Guadalajara, MD  folic acid  (FOLVITE ) 1 MG tablet Take 1 tablet (1 mg total) by mouth daily. 05/16/23   [name], MD  gabapentin  (NEURONTIN ) 100 MG capsule Take 1 capsule (100 mg total) by mouth 3 (three) times daily. Patient taking differently: Take 300 mg by mouth 3 (three) times daily. 06/11/23   [name], [name], MD  hydrOXYzine  (ATARAX ) 25 MG tablet Take 1 tablet  (25 mg total) by mouth 3 (three) times daily as needed for itching or anxiety. Patient taking differently: Take 25 mg by mouth 2 (two) times daily as needed for itching or anxiety. 05/15/23   [name], MD  insulin  aspart (NOVOLOG ) 100 UNIT/ML injection Inject 0-9 Units into the skin 3 (three) times daily with meals. Sliding scale CBG 70 - 120: 0 units CBG 121 - 150: 1 unit,  CBG 151 - 200: 2 units,  CBG 201 - 250: 3 units,  CBG 251 - 300: 5 units,  CBG 301 - 350: 7 units,  CBG 351 - 400: 9 units   CBG > 400: 9 units and notify your MD 06/11/23   [name], [name], MD  insulin  glargine (LANTUS ) 100 UNIT/ML injection Inject 0.25 mLs (25 Units total) into the skin daily. 07/09/23   Darci Pore, MD  Insulin  Syringe-Needle U-100 30G X 1/2 0.5 ML MISC Use with Lantus  05/15/23   [name], MD  ipratropium-albuterol  (DUONEB) 0.5-2.5 (3) MG/3ML SOLN Take 3 mLs by nebulization every 6 (six) hours as needed (for shortness of breath and wheezing).    [provider]  latanoprost  (XALATAN ) 0.005 % ophthalmic solution Place 1 drop into both eyes at bedtime. 06/11/23   [name], [name], MD  magnesium  hydroxide (MILK OF MAGNESIA) 400 MG/5ML suspension Take 30 mLs by mouth 2 (two) times daily as needed for mild constipation.    [provider]  Magnesium  Oxide 420 MG TABS Take 420 mg by mouth daily.    [provider]  mirtazapine  (REMERON ) 15 MG tablet Take 1 tablet (15 mg total) by mouth at bedtime. 06/11/23   [name], [name], MD  Multiple Vitamin (MULTIVITAMIN WITH MINERALS) TABS tablet Take 1 tablet by mouth daily.    [provider]  naloxone  (NARCAN ) nasal spray 4 mg/0.1 mL Place 1 spray into the nose once. Instill 1 spray into one nostril as directed for opioid overdose, call 911 immediately, administer dose, then turn person on side if no response in 2-3 minutes or person responds but relapses. Repeat using a new spray device and into the other nostril.     [provider]  nitroGLYCERIN  (NITROSTAT ) 0.4 MG SL tablet Place 1 tablet (0.4 mg total) under the tongue every 5 (five) minutes as needed for chest pain. 01/29/22   Goodrich, Callie E, PA-C  omeprazole (PRILOSEC) 40 MG capsule Take 40 mg by mouth daily.    [provider]  polyethylene glycol (MIRALAX  / GLYCOLAX ) 17 g packet Take 17 g by mouth 2 (two) times daily. Patient taking differently: Take 17 g by mouth daily as needed for mild constipation. 04/30/23   Christobal Guadalajara, MD  simethicone  (MYLICON) 40 MG/0.6ML drops Take 0.6 mLs (40 mg total) by mouth every 6 (six) hours as needed  for flatulence. 06/11/23   [name], Ripudeep MARLA, MD  sodium bicarbonate  650 MG tablet Take 1 tablet (650 mg total) by mouth 2 (two) times daily. 06/11/23   [name], Nydia MARLA, MD  thiamine  (VITAMIN B-1) 100 MG tablet Take 1 tablet (100 mg total) by mouth daily. 05/16/23   [name], MD    Allergies: Lisinopril -hydrochlorothiazide  and Simvastatin    Review of Systems  Updated Vital Signs BP (!) 144/84 (BP Location: Right Arm)   Pulse 76   Temp (!) 97.4 F (36.3 C) (Oral)   Resp 16   SpO2 100%   Physical Exam  (all labs ordered are listed, but only abnormal results are displayed) Labs Reviewed  COMPREHENSIVE METABOLIC PANEL WITH GFR - Abnormal; Notable for the following components:      Result Value   Potassium 3.3 (*)    CO2 21 (*)    BUN 24 (*)    Creatinine, Ser 1.55 (*)    Total Protein 6.2 (*)    Alkaline Phosphatase 144 (*)    GFR, Estimated 49 (*)    All other components within normal limits  CBC WITH DIFFERENTIAL/PLATELET - Abnormal; Notable for the following components:   WBC 3.4 (*)    RBC 3.27 (*)    Hemoglobin 8.6 (*)    HCT 24.6 (*)    MCV 75.2 (*)    RDW 19.9 (*)    Neutro Abs 1.3 (*)    All other components within normal limits  URINALYSIS, W/ REFLEX TO CULTURE (INFECTION SUSPECTED) - Abnormal; Notable for the following components:   Protein, ur 30 (*)    Bacteria, UA  RARE (*)    All other components within normal limits  I-STAT CHEM 8, ED - Abnormal; Notable for the following components:   Potassium 3.2 (*)    Creatinine, Ser 1.30 (*)    TCO2 20 (*)    Hemoglobin 8.8 (*)    HCT 26.0 (*)    All other components within normal limits  I-STAT CG4 LACTIC ACID, ED  TYPE AND SCREEN    EKG: None  Radiology: DG Foot Complete Left Result Date: 11/09/2024 CLINICAL DATA:  Left foot and ankle swelling. EXAM: LEFT FOOT - COMPLETE 3+ VIEW COMPARISON:  None Available. FINDINGS: Osteopenia. No acute osseous or joint abnormality. Mild soft tissue swelling along the dorsal aspect of the ankle, midfoot and forefoot. IMPRESSION: No acute osseous or joint abnormality. Electronically Signed   By: Newell Eke M.D.   On: 11/09/2024 13:57   DG Chest 2 View Result Date: 11/09/2024 CLINICAL DATA:  Foot pain and swelling. EXAM: CHEST - 2 VIEW COMPARISON:  05/23/2023 and CT chest 06/16/2020. FINDINGS: Right IJ Port-A-Cath terminates in the low SVC. Heart size stable. Minimal streaky atelectasis in the left lower lobe. No pleural fluid. IMPRESSION: No acute findings. Electronically Signed   By: Newell Eke M.D.   On: 11/09/2024 13:56    Procedures   Medications Ordered in the ED  morphine  (PF) 2 MG/ML injection 4 mg (4 mg Intravenous Given 11/09/24 1606)  colchicine  tablet 0.6 mg (0.6 mg Oral Given 11/09/24 1908)  oxyCODONE  (Oxy IR/ROXICODONE ) immediate release tablet 5 mg (5 mg Oral Given 11/09/24 1908)    Medical Decision Making Amount and/or Complexity of Data Reviewed Labs: ordered. Radiology: ordered.  Risk Prescription drug management.   This patient presents to the ED for concern of left toe pain, this involves an extensive number of treatment options, and is a complaint that carries  with it a high risk of complications and morbidity.   Differential diagnosis includes: Cellulitis, osteomyelitis, necrotizing fasciitis, gout, fracture, dislocation,  septic arthritis  Co morbidities:  diabetes, dyslipidemia, COPD, hyperlipidemia, pancreatic cancer currently on chemo, BKA on the right   Lab Tests:  I Ordered, and personally interpreted labs.  The pertinent results include: No acute abnormalities.  Lab work is consistent with patient's baseline  Imaging Studies:  I ordered imaging studies including left foot x-ray I independently visualized and interpreted imaging which showed no acute abnormalities, evidence of gas, fracture or infection I agree with the radiologist interpretation  Cardiac Monitoring/ECG:  The patient was maintained on a cardiac monitor.  I personally viewed and interpreted the cardiac monitored which showed an underlying rhythm of: Sinus rhythm  Medicines ordered and prescription drug management:  I ordered medication including  Medications  morphine  (PF) 2 MG/ML injection 4 mg (4 mg Intravenous Given 11/09/24 1606)  colchicine  tablet 0.6 mg (0.6 mg Oral Given 11/09/24 1908)  oxyCODONE  (Oxy IR/ROXICODONE ) immediate release tablet 5 mg (5 mg Oral Given 11/09/24 1908)   for pain management and treatment of gout flareup Reevaluation of the patient after these medicines showed that the patient improved I have reviewed the patients home medicines and have made adjustments as needed  Test Considered:   none  Critical Interventions:   none  Consultations Obtained: None  Problem List / ED Course:     ICD-10-CM   1. Acute gout involving toe of left foot, unspecified cause  M10.9       MDM: 63 year old male who presents emergency department for evaluation of left great toe pain.  X-rays are unremarkable for osteomyelitis or fracture.  Patient unable to move his toe without significant pain.  On physical exam, no evidence to suggest cellulitis including no erythema or open wound.  Lab workup is also unremarkable and does not suggest infectious etiology.  Patient has pancreatic cancer and is on  chemotherapy, and he has diabetes so I suspect this is likely secondary to a gout flareup.  Patient received his first dose of colchicine  in the emergency department as well as oxycodone  for pain management.  I have sent his prescription for colchicine  over to his preferred pharmacy and educated him on the importance of taking this medication to treat the gout.  I also sent the patient Percocet for pain management.  Patient verbalizes understanding to these instructions.  Vital signs are stable.  Patient is appropriate for discharge at this time.  Dispostion:  After consideration of the diagnostic results and the patients response to treatment, I feel that the patient would benefit from supportive care and PCP follow-up.  Final diagnoses:  Acute gout involving toe of left foot, unspecified cause    ED Discharge Orders          Ordered    colchicine  0.6 MG tablet  Daily        11/09/24 1839    oxyCODONE -acetaminophen  (PERCOCET/ROXICET) 5-325 MG tablet  Every 6 hours PRN        11/09/24 1839               Huxley Shurley S, PA-C 11/09/24 2051    Pamella Ozell LABOR, DO 11/17/24 1242  "

## 2024-11-09 NOTE — ED Triage Notes (Signed)
 Patient BIB [facility] from [facility] in [place] for foot pain and swelling. Patient was there for chemo but did not receive treatment d/t concerns of L foot/ankle swelling. Patient reports to EMS that this feels like it felt like when they cut his other foot off. [facility]  [facility] at 1022 with no relief. Hx of DMII,  BP 108/64 HR 78 R 18 97% RA CBG 207

## 2024-11-09 NOTE — ED Provider Triage Note (Signed)
 Emergency Medicine Provider Triage Evaluation Note  Jose [name] , a 63 y.o. male  was evaluated in triage.  Pt complains of left foot pain x 9 days accompanied with swelling at foot and ankle.  Noting this feels similar to when he had to have right leg amputated below knee secondary to infection.  Currently undergoing chemotherapy for pancreatic cancer.  Denies fever, headache, vision's, chest pain, shortness of breath, nausea, vomiting, diarrhea, dysuria, numbness, weakness, tingling.  Review of Systems  Positive: N/a Negative: N/a  Physical Exam  BP 108/70 (BP Location: Left Arm)   Pulse 76   Temp 97.7 F (36.5 C) (Oral)   SpO2 100%  Gen:   Awake, no distress   Resp:  Normal effort  MSK:   Moves extremities without difficulty  Other:    Medical Decision Making  Medically screening exam initiated at 1:39 PM.  Appropriate orders placed.  Legrand Junior Sheaffer was informed that the remainder of the evaluation will be completed by another provider, this initial triage assessment does not replace that evaluation, and the importance of remaining in the ED until their evaluation is complete.     Beola Terrall RAMAN, [place] 11/09/24 1341

## 2024-12-13 ENCOUNTER — Other Ambulatory Visit: Payer: Self-pay

## 2024-12-13 ENCOUNTER — Inpatient Hospital Stay (HOSPITAL_COMMUNITY)

## 2024-12-13 ENCOUNTER — Encounter (HOSPITAL_COMMUNITY): Payer: Self-pay

## 2024-12-13 ENCOUNTER — Emergency Department (HOSPITAL_COMMUNITY)

## 2024-12-13 ENCOUNTER — Inpatient Hospital Stay (HOSPITAL_COMMUNITY)
Admission: EM | Admit: 2024-12-13 | Discharge: 2024-12-20 | DRG: 871 | Disposition: A | Discharge status: Home / Self Care | Attending: Infectious Diseases | Admitting: Infectious Diseases

## 2024-12-13 ENCOUNTER — Observation Stay (HOSPITAL_COMMUNITY)

## 2024-12-13 DIAGNOSIS — I152 Hypertension secondary to endocrine disorders: Secondary | ICD-10-CM | POA: Diagnosis present

## 2024-12-13 DIAGNOSIS — K219 Gastro-esophageal reflux disease without esophagitis: Secondary | ICD-10-CM | POA: Diagnosis not present

## 2024-12-13 DIAGNOSIS — Z87891 Personal history of nicotine dependence: Secondary | ICD-10-CM

## 2024-12-13 DIAGNOSIS — G9340 Encephalopathy, unspecified: Secondary | ICD-10-CM | POA: Insufficient documentation

## 2024-12-13 DIAGNOSIS — R6520 Severe sepsis without septic shock: Secondary | ICD-10-CM | POA: Diagnosis present

## 2024-12-13 DIAGNOSIS — Z89511 Acquired absence of right leg below knee: Secondary | ICD-10-CM

## 2024-12-13 DIAGNOSIS — L089 Local infection of the skin and subcutaneous tissue, unspecified: Secondary | ICD-10-CM | POA: Diagnosis present

## 2024-12-13 DIAGNOSIS — E46 Unspecified protein-calorie malnutrition: Secondary | ICD-10-CM | POA: Diagnosis not present

## 2024-12-13 DIAGNOSIS — C259 Malignant neoplasm of pancreas, unspecified: Secondary | ICD-10-CM | POA: Diagnosis present

## 2024-12-13 DIAGNOSIS — G928 Other toxic encephalopathy: Secondary | ICD-10-CM | POA: Diagnosis present

## 2024-12-13 DIAGNOSIS — E871 Hypo-osmolality and hyponatremia: Secondary | ICD-10-CM | POA: Diagnosis not present

## 2024-12-13 DIAGNOSIS — Z1152 Encounter for screening for COVID-19: Secondary | ICD-10-CM | POA: Diagnosis not present

## 2024-12-13 DIAGNOSIS — R5081 Fever presenting with conditions classified elsewhere: Secondary | ICD-10-CM | POA: Diagnosis not present

## 2024-12-13 DIAGNOSIS — R109 Unspecified abdominal pain: Secondary | ICD-10-CM

## 2024-12-13 DIAGNOSIS — C250 Malignant neoplasm of head of pancreas: Secondary | ICD-10-CM

## 2024-12-13 DIAGNOSIS — Z79899 Other long term (current) drug therapy: Secondary | ICD-10-CM

## 2024-12-13 DIAGNOSIS — D6959 Other secondary thrombocytopenia: Secondary | ICD-10-CM | POA: Diagnosis present

## 2024-12-13 DIAGNOSIS — N1832 Chronic kidney disease, stage 3b: Secondary | ICD-10-CM | POA: Diagnosis present

## 2024-12-13 DIAGNOSIS — Z794 Long term (current) use of insulin: Secondary | ICD-10-CM

## 2024-12-13 DIAGNOSIS — E7849 Other hyperlipidemia: Secondary | ICD-10-CM | POA: Diagnosis present

## 2024-12-13 DIAGNOSIS — R5383 Other fatigue: Secondary | ICD-10-CM | POA: Diagnosis not present

## 2024-12-13 DIAGNOSIS — E1165 Type 2 diabetes mellitus with hyperglycemia: Secondary | ICD-10-CM | POA: Diagnosis present

## 2024-12-13 DIAGNOSIS — Z7951 Long term (current) use of inhaled steroids: Secondary | ICD-10-CM

## 2024-12-13 DIAGNOSIS — E1159 Type 2 diabetes mellitus with other circulatory complications: Secondary | ICD-10-CM | POA: Diagnosis present

## 2024-12-13 DIAGNOSIS — E8722 Chronic metabolic acidosis: Secondary | ICD-10-CM | POA: Diagnosis present

## 2024-12-13 DIAGNOSIS — Z888 Allergy status to other drugs, medicaments and biological substances status: Secondary | ICD-10-CM

## 2024-12-13 DIAGNOSIS — F1290 Cannabis use, unspecified, uncomplicated: Secondary | ICD-10-CM | POA: Diagnosis present

## 2024-12-13 DIAGNOSIS — Z955 Presence of coronary angioplasty implant and graft: Secondary | ICD-10-CM

## 2024-12-13 DIAGNOSIS — I129 Hypertensive chronic kidney disease with stage 1 through stage 4 chronic kidney disease, or unspecified chronic kidney disease: Secondary | ICD-10-CM | POA: Diagnosis not present

## 2024-12-13 DIAGNOSIS — D509 Iron deficiency anemia, unspecified: Secondary | ICD-10-CM | POA: Diagnosis present

## 2024-12-13 DIAGNOSIS — E639 Nutritional deficiency, unspecified: Secondary | ICD-10-CM | POA: Diagnosis not present

## 2024-12-13 DIAGNOSIS — A419 Sepsis, unspecified organism: Principal | ICD-10-CM | POA: Diagnosis present

## 2024-12-13 DIAGNOSIS — I252 Old myocardial infarction: Secondary | ICD-10-CM

## 2024-12-13 DIAGNOSIS — M109 Gout, unspecified: Secondary | ICD-10-CM | POA: Diagnosis present

## 2024-12-13 DIAGNOSIS — D696 Thrombocytopenia, unspecified: Secondary | ICD-10-CM | POA: Diagnosis not present

## 2024-12-13 DIAGNOSIS — D72829 Elevated white blood cell count, unspecified: Secondary | ICD-10-CM | POA: Diagnosis not present

## 2024-12-13 DIAGNOSIS — Z7902 Long term (current) use of antithrombotics/antiplatelets: Secondary | ICD-10-CM | POA: Diagnosis not present

## 2024-12-13 DIAGNOSIS — F32A Depression, unspecified: Secondary | ICD-10-CM | POA: Diagnosis present

## 2024-12-13 DIAGNOSIS — Z89611 Acquired absence of right leg above knee: Secondary | ICD-10-CM | POA: Diagnosis not present

## 2024-12-13 DIAGNOSIS — D709 Neutropenia, unspecified: Principal | ICD-10-CM | POA: Diagnosis present

## 2024-12-13 DIAGNOSIS — E785 Hyperlipidemia, unspecified: Secondary | ICD-10-CM

## 2024-12-13 DIAGNOSIS — Z8249 Family history of ischemic heart disease and other diseases of the circulatory system: Secondary | ICD-10-CM

## 2024-12-13 DIAGNOSIS — I2510 Atherosclerotic heart disease of native coronary artery without angina pectoris: Secondary | ICD-10-CM | POA: Diagnosis not present

## 2024-12-13 DIAGNOSIS — S91202A Unspecified open wound of left great toe with damage to nail, initial encounter: Secondary | ICD-10-CM | POA: Diagnosis not present

## 2024-12-13 DIAGNOSIS — M86172 Other acute osteomyelitis, left ankle and foot: Secondary | ICD-10-CM | POA: Diagnosis not present

## 2024-12-13 DIAGNOSIS — Z7982 Long term (current) use of aspirin: Secondary | ICD-10-CM

## 2024-12-13 DIAGNOSIS — E1169 Type 2 diabetes mellitus with other specified complication: Secondary | ICD-10-CM | POA: Diagnosis present

## 2024-12-13 DIAGNOSIS — J449 Chronic obstructive pulmonary disease, unspecified: Secondary | ICD-10-CM | POA: Diagnosis present

## 2024-12-13 DIAGNOSIS — E1121 Type 2 diabetes mellitus with diabetic nephropathy: Secondary | ICD-10-CM | POA: Diagnosis not present

## 2024-12-13 DIAGNOSIS — T451X5A Adverse effect of antineoplastic and immunosuppressive drugs, initial encounter: Secondary | ICD-10-CM | POA: Diagnosis present

## 2024-12-13 DIAGNOSIS — M869 Osteomyelitis, unspecified: Secondary | ICD-10-CM | POA: Diagnosis present

## 2024-12-13 DIAGNOSIS — F1010 Alcohol abuse, uncomplicated: Secondary | ICD-10-CM | POA: Diagnosis present

## 2024-12-13 DIAGNOSIS — F1490 Cocaine use, unspecified, uncomplicated: Secondary | ICD-10-CM | POA: Diagnosis present

## 2024-12-13 DIAGNOSIS — E1122 Type 2 diabetes mellitus with diabetic chronic kidney disease: Secondary | ICD-10-CM | POA: Diagnosis present

## 2024-12-13 DIAGNOSIS — E119 Type 2 diabetes mellitus without complications: Secondary | ICD-10-CM

## 2024-12-13 DIAGNOSIS — E1151 Type 2 diabetes mellitus with diabetic peripheral angiopathy without gangrene: Secondary | ICD-10-CM | POA: Diagnosis present

## 2024-12-13 LAB — GLUCOSE, CAPILLARY
Glucose-Capillary: 130 mg/dL — ABNORMAL HIGH (ref 70–99)
Glucose-Capillary: 167 mg/dL — ABNORMAL HIGH (ref 70–99)

## 2024-12-13 LAB — HEMOGLOBIN A1C
Hgb A1c MFr Bld: 7.8 % — ABNORMAL HIGH (ref 4.8–5.6)
Mean Plasma Glucose: 177.16 mg/dL

## 2024-12-13 LAB — COMPREHENSIVE METABOLIC PANEL WITH GFR
ALT: 33 U/L (ref 0–44)
ALT: 35 U/L (ref 0–44)
AST: 27 U/L (ref 15–41)
AST: 35 U/L (ref 15–41)
Albumin: 3.5 g/dL (ref 3.5–5.0)
Albumin: 3.1 g/dL — ABNORMAL LOW (ref 3.5–5.0)
Alkaline Phosphatase: 154 U/L — ABNORMAL HIGH (ref 38–126)
Alkaline Phosphatase: 206 U/L — ABNORMAL HIGH (ref 38–126)
Anion gap: 13 (ref 5–15)
Anion gap: 15 (ref 5–15)
BUN: 25 mg/dL — ABNORMAL HIGH (ref 8–23)
BUN: 22 mg/dL (ref 8–23)
CO2: 16 mmol/L — ABNORMAL LOW (ref 22–32)
CO2: 16 mmol/L — ABNORMAL LOW (ref 22–32)
Calcium: 9.1 mg/dL (ref 8.9–10.3)
Calcium: 8.4 mg/dL — ABNORMAL LOW (ref 8.9–10.3)
Chloride: 100 mmol/L (ref 98–111)
Chloride: 99 mmol/L (ref 98–111)
Creatinine, Ser: 1.48 mg/dL — ABNORMAL HIGH (ref 0.61–1.24)
Creatinine, Ser: 1.33 mg/dL — ABNORMAL HIGH (ref 0.61–1.24)
GFR, Estimated: 52 mL/min — ABNORMAL LOW
GFR, Estimated: 59 mL/min — ABNORMAL LOW
Glucose, Bld: 331 mg/dL — ABNORMAL HIGH (ref 70–99)
Glucose, Bld: 269 mg/dL — ABNORMAL HIGH (ref 70–99)
Potassium: 4.1 mmol/L (ref 3.5–5.1)
Potassium: 4 mmol/L (ref 3.5–5.1)
Sodium: 129 mmol/L — ABNORMAL LOW (ref 135–145)
Sodium: 130 mmol/L — ABNORMAL LOW (ref 135–145)
Total Bilirubin: 0.6 mg/dL (ref 0.0–1.2)
Total Bilirubin: 0.9 mg/dL (ref 0.0–1.2)
Total Protein: 6.4 g/dL — ABNORMAL LOW (ref 6.5–8.1)
Total Protein: 5.7 g/dL — ABNORMAL LOW (ref 6.5–8.1)

## 2024-12-13 LAB — CBC WITH DIFFERENTIAL/PLATELET
Abs Immature Granulocytes: 0.06 K/uL (ref 0.00–0.07)
Basophils Absolute: 0 K/uL (ref 0.0–0.1)
Basophils Relative: 0 %
Eosinophils Absolute: 0 K/uL (ref 0.0–0.5)
Eosinophils Relative: 2 %
HCT: 26.8 % — ABNORMAL LOW (ref 39.0–52.0)
Hemoglobin: 9.2 g/dL — ABNORMAL LOW (ref 13.0–17.0)
Immature Granulocytes: 6 %
Lymphocytes Relative: 82 %
Lymphs Abs: 0.9 K/uL (ref 0.7–4.0)
MCH: 25.6 pg — ABNORMAL LOW (ref 26.0–34.0)
MCHC: 34.3 g/dL (ref 30.0–36.0)
MCV: 74.7 fL — ABNORMAL LOW (ref 80.0–100.0)
Monocytes Absolute: 0 K/uL — ABNORMAL LOW (ref 0.1–1.0)
Monocytes Relative: 1 %
Neutro Abs: 0.1 K/uL — CL (ref 1.7–7.7)
Neutrophils Relative %: 9 %
Platelets: 122 K/uL — ABNORMAL LOW (ref 150–400)
RBC: 3.59 MIL/uL — ABNORMAL LOW (ref 4.22–5.81)
RDW: 18.6 % — ABNORMAL HIGH (ref 11.5–15.5)
Smear Review: NORMAL
WBC: 1.1 K/uL — CL (ref 4.0–10.5)
nRBC: 0 % (ref 0.0–0.2)

## 2024-12-13 LAB — I-STAT VENOUS BLOOD GAS, ED
Acid-base deficit: 8 mmol/L — ABNORMAL HIGH (ref 0.0–2.0)
Bicarbonate: 15.2 mmol/L — ABNORMAL LOW (ref 20.0–28.0)
Calcium, Ion: 1.17 mmol/L (ref 1.15–1.40)
HCT: 26 % — ABNORMAL LOW (ref 39.0–52.0)
Hemoglobin: 8.8 g/dL — ABNORMAL LOW (ref 13.0–17.0)
O2 Saturation: 99 %
Potassium: 4.2 mmol/L (ref 3.5–5.1)
Sodium: 133 mmol/L — ABNORMAL LOW (ref 135–145)
TCO2: 16 mmol/L — ABNORMAL LOW (ref 22–32)
pCO2, Ven: 23.8 mmHg — ABNORMAL LOW (ref 44–60)
pH, Ven: 7.412 (ref 7.25–7.43)
pO2, Ven: 111 mmHg — ABNORMAL HIGH (ref 32–45)

## 2024-12-13 LAB — I-STAT CHEM 8, ED
BUN: 24 mg/dL — ABNORMAL HIGH (ref 8–23)
Calcium, Ion: 1.22 mmol/L (ref 1.15–1.40)
Chloride: 102 mmol/L (ref 98–111)
Creatinine, Ser: 1.5 mg/dL — ABNORMAL HIGH (ref 0.61–1.24)
Glucose, Bld: 311 mg/dL — ABNORMAL HIGH (ref 70–99)
HCT: 28 % — ABNORMAL LOW (ref 39.0–52.0)
Hemoglobin: 9.5 g/dL — ABNORMAL LOW (ref 13.0–17.0)
Potassium: 4.1 mmol/L (ref 3.5–5.1)
Sodium: 132 mmol/L — ABNORMAL LOW (ref 135–145)
TCO2: 17 mmol/L — ABNORMAL LOW (ref 22–32)

## 2024-12-13 LAB — CBG MONITORING, ED: Glucose-Capillary: 229 mg/dL — ABNORMAL HIGH (ref 70–99)

## 2024-12-13 LAB — URINALYSIS, ROUTINE W REFLEX MICROSCOPIC
Bacteria, UA: NONE SEEN
Bilirubin Urine: NEGATIVE
Glucose, UA: >=500 mg/dL — AB
Ketones, ur: 5 mg/dL — AB
Leukocytes,Ua: NEGATIVE
Nitrite: NEGATIVE
Protein, ur: 100 mg/dL — AB
Specific Gravity, Urine: 1.018 (ref 1.005–1.030)
pH: 5 (ref 5.0–8.0)

## 2024-12-13 LAB — TROPONIN T, HIGH SENSITIVITY
Troponin T High Sensitivity: 24 ng/L — ABNORMAL HIGH (ref 0–19)
Troponin T High Sensitivity: 21 ng/L — ABNORMAL HIGH (ref 0–19)

## 2024-12-13 LAB — I-STAT CG4 LACTIC ACID, ED
Lactic Acid, Venous: 0.8 mmol/L (ref 0.5–1.9)
Lactic Acid, Venous: 1.7 mmol/L (ref 0.5–1.9)

## 2024-12-13 LAB — HIV ANTIBODY (ROUTINE TESTING W REFLEX): HIV Screen 4th Generation wRfx: NONREACTIVE

## 2024-12-13 LAB — LIPASE, BLOOD: Lipase: <10 U/L — ABNORMAL LOW (ref 11–51)

## 2024-12-13 LAB — RESP PANEL BY RT-PCR (RSV, FLU A&B, COVID)  RVPGX2
Influenza A by PCR: NEGATIVE
Influenza B by PCR: NEGATIVE
Resp Syncytial Virus by PCR: NEGATIVE
SARS Coronavirus 2 by RT PCR: NEGATIVE

## 2024-12-13 LAB — MAGNESIUM: Magnesium: 1.6 mg/dL — ABNORMAL LOW (ref 1.7–2.4)

## 2024-12-13 LAB — PROTIME-INR
INR: 1.1 (ref 0.8–1.2)
Prothrombin Time: 14.6 s (ref 11.4–15.2)

## 2024-12-13 MED ORDER — SODIUM CHLORIDE 0.9 % IV SOLN
10.0000 mg | Freq: Once | INTRAVENOUS | Status: AC
  Administered 2024-12-14: 10 mg via INTRAVENOUS
  Filled 2024-12-13: qty 1

## 2024-12-13 MED ORDER — FILGRASTIM-AAFI 300 MCG/0.5ML IJ SOSY
300.0000 ug | PREFILLED_SYRINGE | Freq: Every day | INTRAMUSCULAR | Status: DC
  Administered 2024-12-13 – 2024-12-17 (×5): 300 ug via SUBCUTANEOUS
  Filled 2024-12-13 (×5): qty 0.5

## 2024-12-13 MED ORDER — FAMOTIDINE 20 MG PO TABS: 40.0000 mg | ORAL_TABLET | Freq: Every day | ORAL | Status: DC

## 2024-12-13 MED ORDER — LACTATED RINGERS IV BOLUS (SEPSIS)
250.0000 mL | Freq: Once | INTRAVENOUS | Status: AC
  Administered 2024-12-13: 250 mL via INTRAVENOUS

## 2024-12-13 MED ORDER — ACETAMINOPHEN 325 MG PO TABS
650.0000 mg | ORAL_TABLET | Freq: Four times a day (QID) | ORAL | Status: DC | PRN
  Administered 2024-12-13 (×2): 650 mg via ORAL
  Filled 2024-12-13 (×2): qty 2

## 2024-12-13 MED ORDER — SODIUM CHLORIDE 0.9 % IV SOLN
2.0000 g | Freq: Two times a day (BID) | INTRAVENOUS | Status: DC
  Administered 2024-12-13 – 2024-12-15 (×5): 2 g via INTRAVENOUS
  Filled 2024-12-13 (×5): qty 12.5

## 2024-12-13 MED ORDER — IOHEXOL 350 MG/ML SOLN
75.0000 mL | Freq: Once | INTRAVENOUS | Status: AC | PRN
  Administered 2024-12-13: 75 mL via INTRAVENOUS

## 2024-12-13 MED ORDER — ENOXAPARIN SODIUM 40 MG/0.4ML IJ SOSY
40.0000 mg | PREFILLED_SYRINGE | INTRAMUSCULAR | Status: DC
  Administered 2024-12-13 – 2024-12-15 (×3): 40 mg via SUBCUTANEOUS
  Filled 2024-12-13 (×3): qty 0.4

## 2024-12-13 MED ORDER — OXYCODONE HCL 5 MG PO TABS
10.0000 mg | ORAL_TABLET | Freq: Once | ORAL | Status: AC
  Administered 2024-12-13: 10 mg via ORAL
  Filled 2024-12-13: qty 2

## 2024-12-13 MED ORDER — VANCOMYCIN HCL IN DEXTROSE 1-5 GM/200ML-% IV SOLN: 1000.0000 mg | Freq: Once | INTRAVENOUS | Status: DC

## 2024-12-13 MED ORDER — PANTOPRAZOLE SODIUM 40 MG IV SOLR
40.0000 mg | Freq: Two times a day (BID) | INTRAVENOUS | Status: DC
  Administered 2024-12-13 – 2024-12-20 (×14): 40 mg via INTRAVENOUS
  Filled 2024-12-13 (×15): qty 10

## 2024-12-13 MED ORDER — INSULIN ASPART 100 UNIT/ML IJ SOLN
0.0000 [IU] | INTRAMUSCULAR | Status: DC
  Administered 2024-12-13: 2 [IU] via SUBCUTANEOUS
  Administered 2024-12-13 – 2024-12-15 (×6): 1 [IU] via SUBCUTANEOUS
  Administered 2024-12-15: 2 [IU] via SUBCUTANEOUS
  Administered 2024-12-15: 1 [IU] via SUBCUTANEOUS
  Administered 2024-12-16: 3 [IU] via SUBCUTANEOUS
  Administered 2024-12-16: 1 [IU] via SUBCUTANEOUS
  Administered 2024-12-16 – 2024-12-17 (×3): 3 [IU] via SUBCUTANEOUS
  Administered 2024-12-17 – 2024-12-18 (×6): 1 [IU] via SUBCUTANEOUS
  Administered 2024-12-18: 5 [IU] via SUBCUTANEOUS
  Administered 2024-12-18: 3 [IU] via SUBCUTANEOUS
  Administered 2024-12-19: 4 [IU] via SUBCUTANEOUS
  Administered 2024-12-19: 2 [IU] via SUBCUTANEOUS
  Filled 2024-12-13 (×2): qty 1
  Filled 2024-12-13 (×2): qty 3
  Filled 2024-12-13 (×4): qty 1
  Filled 2024-12-13: qty 4
  Filled 2024-12-13 (×2): qty 2
  Filled 2024-12-13: qty 1
  Filled 2024-12-13 (×2): qty 3
  Filled 2024-12-13: qty 2
  Filled 2024-12-13: qty 1
  Filled 2024-12-13: qty 2
  Filled 2024-12-13: qty 3
  Filled 2024-12-13 (×6): qty 1

## 2024-12-13 MED ORDER — MAGNESIUM SULFATE 2 GM/50ML IV SOLN
2.0000 g | Freq: Once | INTRAVENOUS | Status: AC
  Administered 2024-12-13: 2 g via INTRAVENOUS
  Filled 2024-12-13: qty 50

## 2024-12-13 MED ORDER — LACTATED RINGERS IV BOLUS
1000.0000 mL | Freq: Once | INTRAVENOUS | Status: AC
  Administered 2024-12-13: 1000 mL via INTRAVENOUS

## 2024-12-13 MED ORDER — FLUTICASONE FUROATE-VILANTEROL 200-25 MCG/ACT IN AEPB
1.0000 | INHALATION_SPRAY | Freq: Every day | RESPIRATORY_TRACT | Status: DC
  Administered 2024-12-20: 1 via RESPIRATORY_TRACT
  Filled 2024-12-13: qty 28

## 2024-12-13 MED ORDER — LACTATED RINGERS IV BOLUS (SEPSIS)
1000.0000 mL | Freq: Once | INTRAVENOUS | Status: AC
  Administered 2024-12-13: 1000 mL via INTRAVENOUS

## 2024-12-13 MED ORDER — METRONIDAZOLE 500 MG/100ML IV SOLN
500.0000 mg | Freq: Once | INTRAVENOUS | Status: AC
  Administered 2024-12-13: 500 mg via INTRAVENOUS
  Filled 2024-12-13: qty 100

## 2024-12-13 MED ORDER — SODIUM CHLORIDE 0.9 % IV SOLN
2.0000 g | Freq: Once | INTRAVENOUS | Status: AC
  Administered 2024-12-13: 2 g via INTRAVENOUS
  Filled 2024-12-13: qty 12.5

## 2024-12-13 MED ORDER — VANCOMYCIN HCL 1250 MG/250ML IV SOLN
1250.0000 mg | INTRAVENOUS | Status: DC
  Filled 2024-12-13: qty 250

## 2024-12-13 MED ORDER — ONDANSETRON HCL 4 MG/2ML IJ SOLN
4.0000 mg | Freq: Three times a day (TID) | INTRAMUSCULAR | Status: DC | PRN
  Administered 2024-12-13: 4 mg via INTRAVENOUS
  Filled 2024-12-13: qty 2

## 2024-12-13 MED ORDER — FILGRASTIM 300 MCG/ML IJ SOLN: 300.0000 ug | Freq: Every day | INTRAMUSCULAR | Status: DC

## 2024-12-13 MED ORDER — VANCOMYCIN HCL 1750 MG/350ML IV SOLN
1750.0000 mg | Freq: Once | INTRAVENOUS | Status: AC
  Administered 2024-12-13: 1750 mg via INTRAVENOUS
  Filled 2024-12-13: qty 350

## 2024-12-13 NOTE — ED Triage Notes (Addendum)
 Bib [facility] for generalized weakness and diarrhea that has been going on for 2 days. 6th round of chemo on Thursday. Hx of pancreatic ca. Also complaining of chest discomfort. Cbg 353

## 2024-12-13 NOTE — ED Provider Triage Note (Signed)
 Emergency Medicine Provider Triage Evaluation Note  Genie Mirabal , a 64 y.o. male  was evaluated in triage.  Pt complains of chest pain, diarrhea, tarry stools that started Friday after his chemo session.  Review of Systems  Positive: As above Negative: As above  Physical Exam  BP 98/66 (BP Location: Right Arm)   Pulse 82   Temp 98.2 F (36.8 C) (Oral)   Resp 18   Ht 5' 11 (1.803 m)   Wt 73.5 kg   SpO2 100%   BMI 22.59 kg/m  Gen:   Awake, no distress   Resp:  Normal effort  MSK:   Moves extremities without difficulty  Other:    Medical Decision Making  Medically screening exam initiated at 7:10 AM.  Appropriate orders placed.  Legrand Junior Vreeland was informed that the remainder of the evaluation will be completed by another provider, this initial triage assessment does not replace that evaluation, and the importance of remaining in the ED until their evaluation is complete.  Notified nursing that patient needs a room soon.   Hildegard Loge, PA-C 12/13/24 0710

## 2024-12-13 NOTE — ED Provider Notes (Signed)
 " [place] EMERGENCY DEPARTMENT AT [facility] Provider Note   CSN: [id number] Arrival date & time: 12/13/24  9352     [name] presents with: Weakness   Jose [name] is a 64 y.o. male.   65 year old male with past medical history significant for pancreatic cancer presents today for concern of abdominal pain, chills, sweats at home.  Denies any shortness of breath.  Recently had his chemo treatment on Friday.  Symptoms started after Friday.  The history is provided by the [name]. No language interpreter was used.       Prior to Admission medications  Medication Sig Start Date End Date Taking? Authorizing Provider  acetaminophen  (TYLENOL ) 500 MG tablet Take 1,000 mg by mouth in the morning, at noon, and at bedtime.    [provider]  albuterol  (PROVENTIL  HFA;VENTOLIN  HFA) 108 (90 BASE) MCG/ACT inhaler Inhale 2 puffs into the lungs every 6 (six) hours as needed for wheezing or shortness of breath.     [provider]  amitriptyline  (ELAVIL ) 25 MG tablet Take 1 tablet (25 mg total) by mouth at bedtime. 05/15/23   [name], MD  amLODipine  (NORVASC ) 10 MG tablet Take 1 tablet (10 mg total) by mouth daily. 03/12/21   [name], [name], MD  aspirin  EC 81 MG tablet Take 81 mg by mouth daily. Swallow whole.    [provider]  atorvastatin  (LIPITOR ) 80 MG tablet Take 1 tablet (80 mg total) by mouth daily. [name] taking differently: Take 80 mg by mouth every evening. 01/30/22   Goodrich, Callie E, PA-C  benzonatate (TESSALON) 100 MG capsule Take 100 mg by mouth 3 (three) times daily as needed for cough.    [provider]  bisacodyl  (DULCOLAX) 5 MG EC tablet Take 1 tablet (5 mg total) by mouth daily as needed for moderate constipation. 06/11/23   [name], [name], MD  Brinzolamide -Brimonidine  1-0.2 % SUSP Place 1 drop into both eyes 3 (three) times daily.    [provider]  carvedilol  (COREG ) 6.25 MG tablet Take 1 tablet (6.25  mg total) by mouth 2 (two) times daily with a meal. 07/09/23   Darci Pore, MD  clopidogrel  (PLAVIX ) 75 MG tablet Take 1 tablet (75 mg total) by mouth daily with breakfast. 01/30/22   Goodrich, Callie E, PA-C  colchicine  0.6 MG tablet Take 1 tablet (0.6 mg total) by mouth daily. 11/09/24   Dufour, Marry RAMAN, PA-C  cyanocobalamin  (VITAMIN B12) 500 MCG tablet Take 500 mcg by mouth daily.    [provider]  cyclobenzaprine  (FLEXERIL ) 5 MG tablet Take 5 mg by mouth every 4 (four) hours as needed for muscle spasms.    [provider]  Dextrose -Fructose-Sod Citrate 4.35-4.17-0.921 GM/15ML LIQD Take 15 cm by mouth as needed (15cm (1 tube) by mouth as needed for hypoglycemia for  glucose less than 70, repeat every 15 minutes if blood sugar less than 70.).    [provider]  famotidine  (PEPCID ) 20 MG tablet Take 1 tablet (20 mg total) by mouth daily. 06/12/23   [name], MD  ferrous sulfate  325 (65 FE) MG EC tablet Take 325 mg by mouth every Monday, Wednesday, and Friday.    [provider]  fluticasone -salmeterol (ADVAIR) 500-50 MCG/ACT AEPB Inhale 1 puff into the lungs in the morning and at bedtime. 04/30/23   Christobal Guadalajara, MD  folic acid  (FOLVITE ) 1 MG tablet Take 1 tablet (1 mg total) by mouth daily. 05/16/23   Sebastian Toribio  V, MD  gabapentin  (NEURONTIN ) 100 MG capsule Take 1 capsule (100 mg total) by mouth 3 (three) times daily. [name] taking differently: Take 300 mg by mouth 3 (three) times daily. 06/11/23   [name], [name], MD  hydrOXYzine  (ATARAX ) 25 MG tablet Take 1 tablet (25 mg total) by mouth 3 (three) times daily as needed for itching or anxiety. [name] taking differently: Take 25 mg by mouth 2 (two) times daily as needed for itching or anxiety. 05/15/23   [name], MD  insulin  aspart (NOVOLOG ) 100 UNIT/ML injection Inject 0-9 Units into the skin 3 (three) times daily with meals. Sliding scale CBG 70 - 120: 0 units CBG 121 - 150: 1 unit,   CBG 151 - 200: 2 units,  CBG 201 - 250: 3 units,  CBG 251 - 300: 5 units,  CBG 301 - 350: 7 units,  CBG 351 - 400: 9 units   CBG > 400: 9 units and notify your MD 06/11/23   [name], [name], MD  insulin  glargine (LANTUS ) 100 UNIT/ML injection Inject 0.25 mLs (25 Units total) into the skin daily. 07/09/23   Darci Pore, MD  Insulin  Syringe-Needle U-100 30G X 1/2 0.5 ML MISC Use with Lantus  05/15/23   [name], MD  ipratropium-albuterol  (DUONEB) 0.5-2.5 (3) MG/3ML SOLN Take 3 mLs by nebulization every 6 (six) hours as needed (for shortness of breath and wheezing).    [provider]  latanoprost  (XALATAN ) 0.005 % ophthalmic solution Place 1 drop into both eyes at bedtime. 06/11/23   [name], [name], MD  magnesium  hydroxide (MILK OF MAGNESIA) 400 MG/5ML suspension Take 30 mLs by mouth 2 (two) times daily as needed for mild constipation.    [provider]  Magnesium  Oxide 420 MG TABS Take 420 mg by mouth daily.    [provider]  mirtazapine  (REMERON ) 15 MG tablet Take 1 tablet (15 mg total) by mouth at bedtime. 06/11/23   [name], [name], MD  Multiple Vitamin (MULTIVITAMIN WITH MINERALS) TABS tablet Take 1 tablet by mouth daily.    [provider]  naloxone  (NARCAN ) nasal spray 4 mg/0.1 mL Place 1 spray into the nose once. Instill 1 spray into one nostril as directed for opioid overdose, call 911 immediately, administer dose, then turn person on side if no response in 2-3 minutes or person responds but relapses. Repeat using a new spray device and into the other nostril.    [provider]  nitroGLYCERIN  (NITROSTAT ) 0.4 MG SL tablet Place 1 tablet (0.4 mg total) under the tongue every 5 (five) minutes as needed for chest pain. 01/29/22   Goodrich, Callie E, PA-C  omeprazole (PRILOSEC) 40 MG capsule Take 40 mg by mouth daily.    [provider]  oxyCODONE -acetaminophen  (PERCOCET/ROXICET) 5-325 MG tablet Take 1 tablet by mouth every 6 (six)  hours as needed for severe pain (pain score 7-10). 11/09/24   Dufour, Gabrielle S, PA-C  polyethylene glycol (MIRALAX  / GLYCOLAX ) 17 g packet Take 17 g by mouth 2 (two) times daily. [name] taking differently: Take 17 g by mouth daily as needed for mild constipation. 04/30/23   Christobal Guadalajara, MD  simethicone  (MYLICON) 40 MG/0.6ML drops Take 0.6 mLs (40 mg total) by mouth every 6 (six) hours as needed for flatulence. 06/11/23   [name], [name], MD  sodium bicarbonate  650 MG tablet Take 1 tablet (650 mg total) by mouth 2 (two) times daily. 06/11/23   [name], MD  thiamine  (VITAMIN  B-1) 100 MG tablet Take 1 tablet (100 mg total) by mouth daily. 05/16/23   [name], MD    Allergies: Lisinopril -hydrochlorothiazide  and Simvastatin    Review of Systems  Constitutional:  Positive for chills and fever.  Respiratory:  Positive for cough. Negative for shortness of breath.   Cardiovascular:  Negative for chest pain.  Gastrointestinal:  Positive for abdominal pain, nausea and vomiting.  Neurological:  Negative for light-headedness.    Updated Vital Signs BP (!) 188/84   Pulse 62   Temp 99.5 F (37.5 C) (Oral)   Resp 18   Ht 5' 11 (1.803 m)   Wt 73.5 kg   SpO2 100%   BMI 22.59 kg/m   Physical Exam Vitals and nursing note reviewed.  Constitutional:      General: He is not in acute distress.    Appearance: Normal appearance. He is not ill-appearing.  HENT:     Head: Normocephalic and atraumatic.     Nose: Nose normal.  Eyes:     Conjunctiva/sclera: Conjunctivae normal.  Cardiovascular:     Rate and Rhythm: Normal rate and regular rhythm.  Pulmonary:     Effort: Pulmonary effort is normal. No respiratory distress.     Breath sounds: Normal breath sounds. No wheezing.  Abdominal:     Tenderness: There is abdominal tenderness. There is no right CVA tenderness, left CVA tenderness, guarding or rebound.  Musculoskeletal:        General: No deformity. Normal range of motion.      Cervical back: Normal range of motion.  Skin:    Findings: No rash.  Neurological:     Mental Status: He is alert.     (all labs ordered are listed, but only abnormal results are displayed) Labs Reviewed  CBC WITH DIFFERENTIAL/PLATELET - Abnormal; Notable for the following components:      Result Value   WBC 1.1 (*)    RBC 3.59 (*)    Hemoglobin 9.2 (*)    HCT 26.8 (*)    MCV 74.7 (*)    MCH 25.6 (*)    RDW 18.6 (*)    Platelets 122 (*)    Neutro Abs 0.1 (*)    Monocytes Absolute 0.0 (*)    All other components within normal limits  MAGNESIUM  - Abnormal; Notable for the following components:   Magnesium  1.6 (*)    All other components within normal limits  COMPREHENSIVE METABOLIC PANEL WITH GFR - Abnormal; Notable for the following components:   Sodium 129 (*)    CO2 16 (*)    Glucose, Bld 331 (*)    BUN 25 (*)    Creatinine, Ser 1.48 (*)    Total Protein 6.4 (*)    Alkaline Phosphatase 154 (*)    GFR, Estimated 52 (*)    All other components within normal limits  I-STAT CHEM 8, ED - Abnormal; Notable for the following components:   Sodium 132 (*)    BUN 24 (*)    Creatinine, Ser 1.50 (*)    Glucose, Bld 311 (*)    TCO2 17 (*)    Hemoglobin 9.5 (*)    HCT 28.0 (*)    All other components within normal limits  TROPONIN T, HIGH SENSITIVITY - Abnormal; Notable for the following components:   Troponin T High Sensitivity 24 (*)    All other components within normal limits  TROPONIN T, HIGH SENSITIVITY - Abnormal; Notable for the following components:   Troponin T  High Sensitivity 21 (*)    All other components within normal limits  RESP PANEL BY RT-PCR (RSV, FLU A&B, COVID)  RVPGX2  CULTURE, BLOOD (ROUTINE X 2)  CULTURE, BLOOD (ROUTINE X 2)  PROTIME-INR  I-STAT CG4 LACTIC ACID, ED    EKG: EKG Interpretation Date/Time:  Tuesday December 13 2024 07:32:13 EST Ventricular Rate:  102 PR Interval:  118 QRS Duration:  86 QT Interval:  332 QTC Calculation: 432 R  Axis:   52  Text Interpretation: Sinus tachycardia Nonspecific T wave abnormality Abnormal ECG When compared with ECG of 13-Aug-2024 21:40, PREVIOUS ECG IS PRESENT Confirmed by Jerrol Agent ([id number]) on 12/13/2024 8:18:39 AM  Radiology: CT ABDOMEN PELVIS W CONTRAST Result Date: 12/13/2024 CLINICAL DATA:  Chest pain with diarrhea and abdominal pain. History of pancreatic cancer currently undergoing chemotherapy. EXAM: CT ABDOMEN AND PELVIS WITH CONTRAST TECHNIQUE: Multidetector CT imaging of the abdomen and pelvis was performed using the standard protocol following bolus administration of intravenous contrast. RADIATION DOSE REDUCTION: This exam was performed according to the departmental dose-optimization program which includes automated exposure control, adjustment of the mA and/or kV according to [name] size and/or use of iterative reconstruction technique. CONTRAST:  75mL OMNIPAQUE  IOHEXOL  350 MG/ML SOLN COMPARISON:  08/14/2024 FINDINGS: Lower chest: Mild stable cardiomegaly. Minimal calcified plaque over the descending thoracic aorta. Visualized lung bases are clear. Hepatobiliary: Interval replacement of common bile duct stent which appears in adequate position. Air seen within the more proximal common bile duct as well as air within the nondependent portion of the gallbladder. Minimal central intrahepatic biliary air. Liver is otherwise unremarkable. Pancreas: Stable 3 cm slightly septated cystic lesion over the pancreatic tail. Previously seen 2.5 cm pancreatic head mass not as well defined as on the prior exam as there is very minimal ill-defined low-attenuation over the pancreatic head. Spleen: Normal. Adrenals/Urinary Tract: Adrenal glands are normal. Kidneys are normal in size without hydronephrosis. 4 mm calcification over the lower pole right kidney unchanged. Metallic density over the lower pole left kidney unchanged. Resolution of previous seen left perinephric hematoma. Ureters and bladder are  normal. Stomach/Bowel: Stomach is within normal. Small bowel is normal. Appendix is normal. Air and fluid throughout the colon which is otherwise unremarkable. No free peritoneal air. Vascular/Lymphatic: Mild calcified plaque over the abdominal aorta which is normal in caliber. Few small stable portacaval lymph nodes. No other significant adenopathy. Reproductive: Prostate is unremarkable. Other: No significant free peritoneal fluid or free peritoneal air. Musculoskeletal: Significant degenerative changes of the spine and mild degenerate changes of the hips. Multiple stable Schmorl's nodes over the spine without definite metastatic disease. IMPRESSION: 1. No acute findings in the abdomen/pelvis. 2. Interval replacement of common bile duct stent which appears in adequate position with air within the biliary system as described 3. Stable 3 cm slightly septated cystic lesion over the pancreatic tail. Previously seen 2.5 cm pancreatic head mass not as well defined as on the prior exam as there is very minimal ill-defined low-attenuation over the pancreatic head. 4. Stable 4 mm right renal stone. 5. Aortic atherosclerosis. Aortic Atherosclerosis (ICD10-I70.0). Electronically Signed   By: Toribio Agreste M.D.   On: 12/13/2024 11:05   DG Chest 1 View Result Date: 12/13/2024 CLINICAL DATA:  Weakness. EXAM: CHEST  1 VIEW COMPARISON:  11/09/2024 FINDINGS: Right IJ Port-A-Cath unchanged. Lungs are adequately inflated and otherwise clear. Cardiomediastinal silhouette and remainder of the exam is unchanged. IMPRESSION: No active disease. Electronically Signed   By: Toribio Agreste M.D.  On: 12/13/2024 07:44     .Critical Care  Performed by: Hildegard Loge, PA-C Authorized by: Hildegard Loge, PA-C   Critical care provider statement:    Critical care time (minutes):  30   Critical care was necessary to treat or prevent imminent or life-threatening deterioration of the following conditions:  Sepsis   Critical care was time spent  personally by me on the following activities:  Development of treatment plan with [name] or surrogate, discussions with consultants, evaluation of [name]'s response to treatment, examination of [name], ordering and review of laboratory studies, ordering and review of radiographic studies, ordering and performing treatments and interventions, pulse oximetry, re-evaluation of [name]'s condition and review of old charts   Care discussed with: admitting provider      Medications Ordered in the ED  metroNIDAZOLE  (FLAGYL ) IVPB 500 mg (500 mg Intravenous New Bag/Given 12/13/24 1101)  vancomycin  (VANCOREADY) IVPB 1750 mg/350 mL (has no administration in time range)  oxyCODONE  (Oxy IR/ROXICODONE ) immediate release tablet 10 mg (10 mg Oral Given 12/13/24 0822)  lactated ringers  bolus 1,000 mL (1,000 mLs Intravenous New Bag/Given 12/13/24 0956)    And  lactated ringers  bolus 1,000 mL (1,000 mLs Intravenous New Bag/Given 12/13/24 0956)    And  lactated ringers  bolus 250 mL (250 mLs Intravenous New Bag/Given 12/13/24 0956)  ceFEPIme  (MAXIPIME ) 2 g in sodium chloride  0.9 % 100 mL IVPB (0 g Intravenous Stopped 12/13/24 1055)  iohexol  (OMNIPAQUE ) 350 MG/ML injection 75 mL (75 mLs Intravenous Contrast Given 12/13/24 1031)                                    Medical Decision Making Amount and/or Complexity of Data Reviewed Labs: ordered. Radiology: ordered.  Risk Prescription drug management. Decision regarding hospitalization.   Medical Decision Making / ED Course   This [name] presents to the ED for concern of abdominal pain, fever, nausea, vomiting, this involves an extensive number of treatment options, and is a complaint that carries with it a high risk of complications and morbidity.  The differential diagnosis includes gastroenteritis, colitis, worsening of the pancreatic cancer, viral URI  MDM: 64 year old male with past medical history significant for pancreatic cancer presents today for  concern of abdominal pain, chills, sweats at home.  Denies any shortness of breath.  Recently had his chemo treatment on Friday.  Symptoms started after Friday. Blood work shows evidence of neutropenia.  Given his fevers at home we will treat this as neutropenic fever.  Discussed with our oncologist Dr. Gatha who stated [name] can stay at [facility] for treatment.  Recommended touching base with [name]'s primary oncologist.  I attempted to look him up but was unable to find details since [facility].  CT abdomen pelvis does not show any emergent findings.  Will start him on broad-spectrum antibiotics.  Will discuss with admitting team.  Discussed with admitting team.  They will evaluate [name] for admission.   Additional history obtained: -Additional history obtained from chart review [name] sees [facility] in [place].  He sees Dr. Tina -External records from outside source obtained and reviewed including: Chart review including previous notes, labs, imaging, consultation notes   Lab Tests: -I ordered, reviewed, and interpreted labs.   The pertinent results include:   Labs Reviewed  CBC WITH DIFFERENTIAL/PLATELET - Abnormal; Notable for the following components:      Result Value   WBC 1.1 (*)  RBC 3.59 (*)    Hemoglobin 9.2 (*)    HCT 26.8 (*)    MCV 74.7 (*)    MCH 25.6 (*)    RDW 18.6 (*)    Platelets 122 (*)    Neutro Abs 0.1 (*)    Monocytes Absolute 0.0 (*)    All other components within normal limits  MAGNESIUM  - Abnormal; Notable for the following components:   Magnesium  1.6 (*)    All other components within normal limits  COMPREHENSIVE METABOLIC PANEL WITH GFR - Abnormal; Notable for the following components:   Sodium 129 (*)    CO2 16 (*)    Glucose, Bld 331 (*)    BUN 25 (*)    Creatinine, Ser 1.48 (*)    Total Protein 6.4 (*)    Alkaline Phosphatase 154 (*)    GFR, Estimated 52 (*)    All other components within normal limits   URINALYSIS, ROUTINE W REFLEX MICROSCOPIC - Abnormal; Notable for the following components:   Glucose, UA >=500 (*)    Hgb urine dipstick SMALL (*)    Ketones, ur 5 (*)    Protein, ur 100 (*)    All other components within normal limits  I-STAT CHEM 8, ED - Abnormal; Notable for the following components:   Sodium 132 (*)    BUN 24 (*)    Creatinine, Ser 1.50 (*)    Glucose, Bld 311 (*)    TCO2 17 (*)    Hemoglobin 9.5 (*)    HCT 28.0 (*)    All other components within normal limits  I-STAT VENOUS BLOOD GAS, ED - Abnormal; Notable for the following components:   pCO2, Ven 23.8 (*)    pO2, Ven 111 (*)    Bicarbonate 15.2 (*)    TCO2 16 (*)    Acid-base deficit 8.0 (*)    Sodium 133 (*)    HCT 26.0 (*)    Hemoglobin 8.8 (*)    All other components within normal limits  TROPONIN T, HIGH SENSITIVITY - Abnormal; Notable for the following components:   Troponin T High Sensitivity 24 (*)    All other components within normal limits  TROPONIN T, HIGH SENSITIVITY - Abnormal; Notable for the following components:   Troponin T High Sensitivity 21 (*)    All other components within normal limits  RESP PANEL BY RT-PCR (RSV, FLU A&B, COVID)  RVPGX2  CULTURE, BLOOD (ROUTINE X 2)  CULTURE, BLOOD (ROUTINE X 2)  GASTROINTESTINAL PANEL BY PCR, STOOL (REPLACES STOOL CULTURE)  C DIFFICILE QUICK SCREEN W PCR REFLEX    PROTIME-INR  HIV ANTIBODY (ROUTINE TESTING W REFLEX)  COMPREHENSIVE METABOLIC PANEL WITH GFR  HEMOGLOBIN A1C  LIPASE, BLOOD  I-STAT CG4 LACTIC ACID, ED  I-STAT CG4 LACTIC ACID, ED  I-STAT CG4 LACTIC ACID, ED      EKG  EKG Interpretation Date/Time:  Tuesday December 13 2024 07:32:13 EST Ventricular Rate:  102 PR Interval:  118 QRS Duration:  86 QT Interval:  332 QTC Calculation: 432 R Axis:   52  Text Interpretation: Sinus tachycardia Nonspecific T wave abnormality Abnormal ECG When compared with ECG of 13-Aug-2024 21:40, PREVIOUS ECG IS PRESENT Confirmed by Jerrol Agent ([id number]) on 12/13/2024 8:18:39 AM         Imaging Studies ordered: I ordered imaging studies including CT abdomen pelvis with contrast I independently visualized and interpreted imaging. I agree with the radiologist interpretation   Medicines ordered and prescription drug management: Meds  ordered this encounter  Medications   oxyCODONE  (Oxy IR/ROXICODONE ) immediate release tablet 10 mg    Refill:  0   AND Linked Order Group    lactated ringers  bolus 1,000 mL     Total Body Weight basis for 30 mL/kg  bolus delivery:   73.5 kg    lactated ringers  bolus 1,000 mL     Total Body Weight basis for 30 mL/kg  bolus delivery:   73.5 kg    lactated ringers  bolus 250 mL     Total Body Weight basis for 30 mL/kg  bolus delivery:   73.5 kg   ceFEPIme  (MAXIPIME ) 2 g in sodium chloride  0.9 % 100 mL IVPB    Antibiotic Indication::   Other Indication (list below)    Other Indication::   Unknown Source.   metroNIDAZOLE  (FLAGYL ) IVPB 500 mg    Antibiotic Indication::   Other Indication (list below)    Other Indication::   Unknown Source.   DISCONTD: vancomycin  (VANCOCIN ) IVPB 1000 mg/200 mL premix    Indication::   Other Indication (list below)    Other Indication::   Unknown Source.   vancomycin  (VANCOREADY) IVPB 1750 mg/350 mL    Indication::   Other Indication (list below)    Other Indication::   Unknown Source.   iohexol  (OMNIPAQUE ) 350 MG/ML injection 75 mL   enoxaparin  (LOVENOX ) injection 40 mg   acetaminophen  (TYLENOL ) tablet 650 mg   vancomycin  (VANCOREADY) IVPB 1250 mg/250 mL    Indication::   Febrile Neutropenia   ceFEPIme  (MAXIPIME ) 2 g in sodium chloride  0.9 % 100 mL IVPB    Antibiotic Indication::   Febrile Neutropenia   magnesium  sulfate IVPB 2 g 50 mL   insulin  aspart (novoLOG ) injection 0-6 Units    Correction coverage::   Very Sensitive (ESRD/Dialysis)    CBG < 70::   Implement Hypoglycemia Standing Orders and refer to Hypoglycemia Standing Orders sidebar report    CBG  70 - 120::   0 units    CBG 121 - 150::   0 units    CBG 151 - 200::   1 unit    CBG 201-250::   2 units    CBG 251-300::   3 units    CBG 301-350::   4 units    CBG 351-400::   5 units    CBG > 400:   Give 6 units and call MD   lactated ringers  bolus 1,000 mL    -I have reviewed the patients home medicines and have made adjustments as needed  Critical interventions Broad-spectrum antibiotics, sepsis protocol, fluids   Reevaluation: After the interventions noted above, I reevaluated the [name] and found that they have :stayed the same  Co morbidities that complicate the [name] evaluation  Past Medical History:  Diagnosis Date   Alcohol  abuse    CAD (coronary artery disease)    a. Reported MI in 2012 s/p 2 stents;  b. 08/2012 Cath: LM 20, LAD 20 diff ISR, jailed septal - 99%, LCX 30ost, RI large, min irregs, RCA 30p, 20-30 ISR-->Med Rx; c. 2017 [facility]   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  They will evaluate [name] for admission.  Final diagnoses:  Neutropenic fever  Abdominal pain, unspecified abdominal location    ED Discharge Orders     None          Hildegard Loge, PA-C 12/13/24 1425    Jerrol Agent, MD 12/13/24 1620  "

## 2024-12-13 NOTE — Hospital Course (Addendum)
 #Neutropenic fever, resolved  #Absolute neutrophil count 100, resolved  #Leukopenia, resolved  #Sepsis, resolved  #Encephalopathy, resolved  #Pancreatic adenocarcinoma on chemotherapy No source of infection found throughout admission.  CT of the abdomen and pelvis without signs of perforation, abscess, or other source.  Biliary stent appears patent.  No significant respiratory symptoms, UA without signs of infection, flu/COVID/RSV negative.  Case discussed with oncology who began granulocyte colony stimulating factor. Follows with outpatient Oncologist Dr. Tina at [facility]. Pancreatic biopsy 07/12/2024 showed adenocarcinoma. Immunoperoxidase studies performed on the core biopsy show an increased Ki67and loss of p53 in the neoplastic glands.  Currently on cycle 3 of chemotherapy with last infusion on 12/09/2024, gemcitabine and paclitaxel. C. diff negative. GI path panel negative. Blood cultures with no growth at 5 days, final. His left Great toenail was unroofed recently, so left foot X-ray was obtained which was negative for osteomyelitis. He was managed with IVF, IV antibiotics Metronidazole , Vancomycin , and Cefepime  for 72 hours with improvement of his clinical status and neutrophile count. Discussed with Oncology who felt comfortable with 72 hour course of IV abx and discontinuing. His initial fever was lowered with Tylenol , but after 24 hours, Tylenol  was held to assess fever curve. He remained afebrile throughout the rest of admission. He was monitored with daily CBC with diff, BMP and Mag panels. Bump in white count ikely in the setting of one extra dose of GSF. This was discontinued but patient looking well clinically with no new sick symptoms with WBC improving. Leukocytes and neutrophils by day of discharge improved. He was found to be thrombocytopenic to 69, and felt to be due to chemotherapy. He did not require platelet transfusion and no active bleeds. He was switched to Fondaparinox from  Lovenox , though felt unlikely HIT. Heparin  induced platelet Ab lab was negative. He continued to endorse left Great toe pain as below, so obtained MRI left foot w/wo contrast to rule out osteomyelitis, this showed concern for osteomyelitis of the left Great toe distal phalanx. He was resumed on IV Vanc and Cefepime  on 1/23. Ortho recommended switching to oral antibiotics and close follow up outpatient with them. They did not recommend immediate surgery while inpatient. Will need to follow up outpatient. He was discharged in good condition.  - f/u outpatient with Ortho - f/u outpatient Podiatry (needs to reschedule missed appointment due to hospitalization)  - f/u outpatient Dr. Tina Oncology  - repeat CBC with diff at f/u   #Thrombocytopenia, resolved Thrombocytopenia on labs, based on prior labs visible baseline does not appear to have a history of thrombocytopenia. 122 --> 99 --> 91 --> 74 --> 69 (nadir) --> 150 by day of discharge. Likely due to chemotherapy, though has been on chemo since September 2025. Immature platelet fraction of 1.7. Since we cannot [facility] all [facility], unclear if recent thrombocytopenia or Lovenox  exposure. He had been getting Lovenox  through admission, so discontinued and switched to fondaparinox. Heparin  induced platelet Ab lab negative. He did not have concerns for active bleeding during admission.  - repeat CBC with diff at f/u   #Left Great toenail unroofing #Left Great toe distal phalanx osteomyelitis  #History of Right AKA 2024 Patient has unroofed left Great toenail. Picture in media tab. Does not appear with erythema or drainage. He reports the toenail fell off recently. Seen 11/09/2024 in the [facility] ED for left foot pain and swelling where X-rays obtained unremarkable for osteomyelitis or fracture. He was given Colchicine  for concern of possible gout flare. No  mention of whether toenail was present or not at that time.  Given neutropenia and negative infectious work  up thus far, repeated left foot X-ray which did not show evidence of osteomyelitis. Wound care was consulted, appreciate assistance. Patient had continued severe toe pain so discussed with Podiatry and Ortho. MRI left foot w/wo contrast to rule out osteomyelitis, this showed concern for osteomyelitis of the left Great toe distal phalanx. He was resumed on IV Vanc and Cefepime  on 1/23. Ortho recommended switching to oral antibiotics and close follow up outpatient with them. They did not recommend immediate surgery while inpatient. Will need to follow up outpatient.  - Left Great toe wound care recs: no wrapping, creams or lotions - WBAT - patient to monitor improvement vs. 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 Sodium bicarb 650 mg twice daily is listed on his home medication list and on admission bicarb low at 16, though improved to 19-20 so this was held during admission and monitored daily. He was resumed on Gabapentin  300 mg TID. Insulin  glargine was held during admission and he was initiated on SSI.  - Continue home Gabapentin  300 mg TID  - Hold home Sodium bicarb 650 mg twice daily   #Hypertension #Hyperlipidemia #CAD Blood pressure initially elevated here but now normotensive.  We will hold antihypertensives for now.  No reports of chest pain today. Will plan to resume home medications Aspirin  81 mg daily, Plavix  75 mg daily,  and Atorvastatin  80 mg daily. Originally held home amlodipine  and carvedilol  originally iso possible shock. Though BP has been normotensive to hypertensive so resumed Carvedilol  to start with, will resume amlodipine  if continued elevated BP.   - Continue home Aspirin  81 mg - Continue home Plavix  75 mg daily  - Continue home Atorvastatin  80 mg daily - Continue home Carvedilol  6.25 mg twice daily - Hold home Amlodipine  10 mg daily and resume as clinically appropriate - Sublingual nitroglycerin    #COPD No significant respiratory symptoms here and on home Fluticasone /salmeterol 500-50 micrograms 1 puff twice daily.  Continued this during admission and added Duonebs as needed.  - Continue home Fluticasone /salmeterol 500-50 micrograms 1 puff twice daily    #Protein Calorie Deficit #Nutritional deficiencies  Patient takes home Ensure, Folic acid  1 mg daily, Vitamin B12 500 mcg daily, Mirtazapine  15 mg daily, and Thiamine  100 mg daily. Will resume these and allow him to drink as tolerated starting with CLD.  - Resume home Folic acid  1 mg daily - Resume home Vitamin B12 500 mcg daily - Resume home Mirtazapine  15 mg daily - Resume home Thiamine  100 mg daily  #GERD Takes home Omeprazole 40 mg daily. Gave Protonix  40 mg BID while admitted.  - Protonix  40 mg BID

## 2024-12-13 NOTE — Consult Note (Addendum)
 [facility] CONSULT NOTE  [facility] Care Team: [facility] as [facility]CP - General (General Practice) [name], [name], MD (Inactive) as PCP - Cardiology (Cardiology)  CHIEF COMPLAINTS/PURPOSE OF CONSULTATION:  Neutropenic fever  REFERRING PHYSICIAN:  HISTORY OF PRESENTING ILLNESS: Jose [name] 64 y.o. male who presented on 12/13/2024 with neutropenic fever, nausea, vomiting and lower abdominal pain.  Oncologic history is significant for pancreatic cancer, currently receiving chemotherapy at outside facility, [facility].  Therefore oncology evaluation has been requested. Patient is seen laying in bed in the ED with his eyes closed.  Opens his eyes to tactile stimuli however does not respond verbally.  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 Discussed with nurse that he currently has a temperature of 103 and had just received Tylenol .  There is no family at the bedside.  All history is being taken from previous documentation. Medical history includes CAD, CKD, COPD, diabetes, hyperlipidemia, hypertension and pancreatic cancer. Surgical history includes right AKA, cardiac cath, and stents placement. Social history significant for former tobacco use, quit several years ago.  Previously admitted to alcohol  abuse and marijuana and cocaine use.    I have reviewed his chart and materials related to his cancer extensively and collaborated history with the patient. Summary of oncologic history is as follows: Oncology History   No problem history exists.    ASSESSMENT & PLAN:  Neutropenic fever - WBC 1.1 with ANC 0.1 - Likely secondary to recent chemotherapy - Give G-CSF 300 mcg daily until ANC greater than 1.5 - Continue antibiotics as ordered - Monitor fever curve  Pancreatic cancer - Follows with primary oncologist/Dr. Oneil Chihuahua at [facility].   - Received cycle 3-day 15 gemcitabine + nab-paclitaxel chemotherapy.  Last treatment given 12/02/2024. - No oncologic  therapy planned during this admission. - Follow-up with primary oncologist upon discharge.  Generalized weakness Diarrhea - Continue supportive care    MEDICAL HISTORY:  Past Medical History:  Diagnosis Date   Alcohol  abuse    CAD (coronary artery disease)    a. Reported MI in 2012 s/p 2 stents;  b. 08/2012 Cath: LM 20, LAD 20 diff ISR, jailed septal - 99%, LCX 30ost, RI large, min irregs, RCA 30p, 20-30 ISR-->Med Rx; c. 2017 [facility]   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   Gangrene of right foot (HCC) 04/24/2023   Hyperlipidemia    Hypertension    Hypertensive urgency 02/11/2021   Metabolic acidosis 05/11/2023   Noncompliance    Pancreatic cancer (HCC)    Peripheral arterial disease     SURGICAL HISTORY: Past Surgical History:  Procedure Laterality Date   ABDOMINAL AORTOGRAM W/LOWER EXTREMITY N/A 04/27/2023   Procedure: ABDOMINAL AORTOGRAM W/LOWER EXTREMITY;  Surgeon: Sheree Penne Bruckner, MD;  Location: [facility] INVASIVE CV LAB;  Service: Cardiovascular;  Laterality: N/A;   AMPUTATION Right 05/30/2023   Procedure: AMPUTATION BELOW KNEE;  Surgeon: Sheree Penne Bruckner, MD;  Location: [facility] OR;  Service: Vascular;  Laterality: Right;   AMPUTATION Right 07/01/2023   Procedure: RIGHT ABOVE KNEE AMPUTATION;  Surgeon: Magda Debby SAILOR, MD;  Location: [facility] OR;  Service: Vascular;  Laterality: Right;   APPLICATION OF WOUND VAC Right 07/01/2023   Procedure: APPLICATION OF WOUND VAC;  Surgeon: Magda Debby SAILOR, MD;  Location: [facility] OR;  Service: Vascular;  Laterality: Right;   CARDIAC CATHETERIZATION     CORONARY STENT PLACEMENT     FRACTURE SURGERY Right    hardware from right leg fracture   LEFT HEART CATH AND CORONARY ANGIOGRAPHY N/A 08/22/2020   Procedure:  LEFT HEART CATH AND CORONARY ANGIOGRAPHY;  Surgeon: Claudene Victory ORN, MD;  Location: [facility] INVASIVE CV LAB;  Service: Cardiovascular;  Laterality: N/A;   LEFT HEART CATH AND CORONARY ANGIOGRAPHY N/A 01/28/2022   Procedure: LEFT HEART CATH AND CORONARY ANGIOGRAPHY;  Surgeon: Burnard Debby LABOR, MD;  Location: [facility] INVASIVE CV LAB;  Service: Cardiovascular;  Laterality: N/A;   LEFT HEART CATHETERIZATION WITH CORONARY ANGIOGRAM Bilateral 08/25/2012   Procedure: LEFT HEART CATHETERIZATION WITH CORONARY ANGIOGRAM;  Surgeon: Lonni JONETTA Cash, MD;  Location: [facility] CATH LAB;  Service: Cardiovascular;  Laterality: Bilateral;   PERIPHERAL INTRAVASCULAR LITHOTRIPSY  04/27/2023   Procedure: PERIPHERAL INTRAVASCULAR LITHOTRIPSY;  Surgeon: Sheree Penne Lonni, MD;  Location: [facility] INVASIVE CV LAB;  Service: Cardiovascular;;   PERIPHERAL VASCULAR BALLOON ANGIOPLASTY  04/27/2023   Procedure: PERIPHERAL VASCULAR BALLOON ANGIOPLASTY;  Surgeon: Sheree Penne Lonni, MD;  Location: [facility] INVASIVE CV LAB;  Service: Cardiovascular;;   TRANSMETATARSAL AMPUTATION Right 05/12/2023   Procedure: TRANSMETATARSAL AMPUTATION;  Surgeon: Eliza Lonni RAMAN, MD;  Location: [facility] OR;  Service: Vascular;  Laterality: Right;    SOCIAL HISTORY: Social History   Socioeconomic History   Marital status: Married    Spouse name: Not on file   Number of children: Not on file   Years of education: Not on file   Highest education level: Not on file  Occupational History   Not on file  Tobacco Use   Smoking status: Former    Current packs/day: 0.00    Types: Cigarettes    Quit date: 11/25/2007    Years since quitting: 17.0   Smokeless tobacco: Never  Vaping Use   Vaping status: Never Used  Substance and Sexual Activity   Alcohol  use: Not Currently    Comment: Used to drink heavily - says currently 2 beers/day.   Drug use: Yes    Types: Marijuana    Comment: reports not using cocaine   Sexual activity: Yes    Birth  control/protection: None  Other Topics Concern   Not on file  Social History Narrative   Lives in [place] by himself.  Does not work or routinely exercise.   Social Drivers of Health   Tobacco Use: Medium Risk (12/13/2024)   Patient History    Smoking Tobacco Use: Former    Smokeless Tobacco Use: Never    Passive Exposure: Not on file  Financial Resource Strain: Not on file  Food Insecurity: Low Risk (08/14/2024)   Received from [facility]   Epic    Within the past 12 months, you worried that your food would run out before you got money to buy more: Never true    Within the past 12 months, the food you bought just didn't last and you didn't have money to get more. : Never true  Transportation Needs: No Transportation Needs (08/14/2024)   Received from [facility]    In the past 12 months, has lack of reliable transportation kept you from medical appointments, meetings, work or from getting things needed for daily living? : No  Physical Activity: Not on file  Stress: Not on file  Social Connections: Unknown (03/05/2023)   Received from [facility]  Social Network    Social Network: Not on file  Intimate Partner Violence: Not At Risk (06/26/2023)   Humiliation, Afraid, Rape, and Kick questionnaire    Fear of Current or Ex-Partner: No    Emotionally Abused: No    Physically Abused: No    Sexually Abused: No  Depression (PHQ2-9): High Risk (08/11/2022)   Depression (PHQ2-9)    PHQ-2 Score: 11  Alcohol  Screen: Not on file  Housing: Low Risk (08/14/2024)   Received from [facility]   Epic    What is your living situation today?: I have a steady place to live    Think about the place you live. Do you have problems with any of the following? Choose all that apply:: None/None on this list  Utilities: Low Risk (08/14/2024)   Received from [facility]   Utilities    In the past 12 months has the electric, gas, oil, or water  company threatened to shut off services  in your home? : No  Health Literacy: Not on file    FAMILY HISTORY: Family History  Problem Relation Age of Onset   Heart disease Mother        MI 63s     PHYSICAL EXAMINATION: ECOG PERFORMANCE STATUS: 4 - Bedbound  Vitals:   12/13/24 1200 12/13/24 1215  BP: (!) 175/93 (!) 174/91  Pulse:    Resp: 19 (!) 22  Temp:    SpO2: 100% 100%   Filed Weights   12/13/24 0655  Weight: 162 lb (73.5 kg)    GENERAL: + Lethargic + chronically ill-appearing SKIN: skin color, texture, turgor are normal, no rashes or significant lesions EYES: normal, conjunctiva are pink and non-injected, sclera clear OROPHARYNX: no exudate, no erythema and lips, buccal mucosa, and tongue normal  NECK: supple, thyroid normal size, non-tender, without nodularity LYMPH: no palpable lymphadenopathy in the cervical, axillary or inguinal LUNGS: clear to auscultation and percussion with normal breathing effort HEART: regular rate & rhythm and no murmurs and no lower extremity edema ABDOMEN: abdomen soft, non-tender and normal bowel sounds MUSCULOSKELETAL: + Right AKA PSYCH: + Lethargic NEURO: no focal motor/sensory deficits   ALLERGIES:  is allergic to lisinopril -hydrochlorothiazide  and simvastatin.  MEDICATIONS:  Current Facility-Administered Medications  Medication Dose Route Frequency Provider Last Rate Last Admin   enoxaparin  (LOVENOX ) injection 40 mg  40 mg Subcutaneous Q24H Jolaine Pac, DO       vancomycin  LUCRECIA) IVPB 1750 mg/350 mL  1,750 mg Intravenous Once Lawsing, James, MD 175 mL/hr at 12/13/24 1202 1,750 mg at 12/13/24 1202   Current Outpatient Medications  Medication Sig Dispense Refill   acetaminophen  (TYLENOL ) 500 MG tablet Take 1,000 mg by mouth in the morning, at noon, and at bedtime.     albuterol  (PROVENTIL  HFA;VENTOLIN  HFA) 108 (90 BASE) MCG/ACT inhaler Inhale 2 puffs into the lungs every 6 (six) hours as needed for wheezing or shortness of breath.      amitriptyline  (ELAVIL )  25 MG tablet Take 1 tablet (25 mg total) by mouth at bedtime. 30 tablet 1   amLODipine  (NORVASC ) 10 MG tablet Take 1 tablet (10 mg total) by mouth daily. 90 tablet 3   aspirin  EC 81 MG tablet Take 81 mg by mouth daily. Swallow whole.     atorvastatin  (LIPITOR ) 80 MG tablet Take 1 tablet (80 mg total) by mouth daily. (Patient taking differently: Take 80 mg by mouth every evening.) 30 tablet 2   benzonatate (TESSALON) 100 MG capsule Take 100 mg by mouth 3 (  three) times daily as needed for cough.     bisacodyl  (DULCOLAX) 5 MG EC tablet Take 1 tablet (5 mg total) by mouth daily as needed for moderate constipation.     Brinzolamide -Brimonidine  1-0.2 % SUSP Place 1 drop into both eyes 3 (three) times daily.     carvedilol  (COREG ) 6.25 MG tablet Take 1 tablet (6.25 mg total) by mouth 2 (two) times daily with a meal.     clopidogrel  (PLAVIX ) 75 MG tablet Take 1 tablet (75 mg total) by mouth daily with breakfast. 30 tablet 5   colchicine  0.6 MG tablet Take 1 tablet (0.6 mg total) by mouth daily. 30 tablet 0   cyanocobalamin  (VITAMIN B12) 500 MCG tablet Take 500 mcg by mouth daily.     cyclobenzaprine  (FLEXERIL ) 5 MG tablet Take 5 mg by mouth every 4 (four) hours as needed for muscle spasms.     Dextrose -Fructose-Sod Citrate 4.35-4.17-0.921 GM/15ML LIQD Take 15 cm by mouth as needed (15cm (1 tube) by mouth as needed for hypoglycemia for  glucose less than 70, repeat every 15 minutes if blood sugar less than 70.).     famotidine  (PEPCID ) 20 MG tablet Take 1 tablet (20 mg total) by mouth daily.     ferrous sulfate  325 (65 FE) MG EC tablet Take 325 mg by mouth every Monday, Wednesday, and Friday.     fluticasone -salmeterol (ADVAIR) 500-50 MCG/ACT AEPB Inhale 1 puff into the lungs in the morning and at bedtime.     folic acid  (FOLVITE ) 1 MG tablet Take 1 tablet (1 mg total) by mouth daily.     gabapentin  (NEURONTIN ) 100 MG capsule Take 1 capsule (100 mg total) by mouth 3 (three) times daily. (Patient taking  differently: Take 300 mg by mouth 3 (three) times daily.)     hydrOXYzine  (ATARAX ) 25 MG tablet Take 1 tablet (25 mg total) by mouth 3 (three) times daily as needed for itching or anxiety. (Patient taking differently: Take 25 mg by mouth 2 (two) times daily as needed for itching or anxiety.) 20 tablet 0   insulin  aspart (NOVOLOG ) 100 UNIT/ML injection Inject 0-9 Units into the skin 3 (three) times daily with meals. Sliding scale CBG 70 - 120: 0 units CBG 121 - 150: 1 unit,  CBG 151 - 200: 2 units,  CBG 201 - 250: 3 units,  CBG 251 - 300: 5 units,  CBG 301 - 350: 7 units,  CBG 351 - 400: 9 units   CBG > 400: 9 units and notify your MD     insulin  glargine (LANTUS ) 100 UNIT/ML injection Inject 0.25 mLs (25 Units total) into the skin daily.     Insulin  Syringe-Needle U-100 30G X 1/2 0.5 ML MISC Use with Lantus  30 each 0   ipratropium-albuterol  (DUONEB) 0.5-2.5 (3) MG/3ML SOLN Take 3 mLs by nebulization every 6 (six) hours as needed (for shortness of breath and wheezing).     latanoprost  (XALATAN ) 0.005 % ophthalmic solution Place 1 drop into both eyes at bedtime.     magnesium  hydroxide (MILK OF MAGNESIA) 400 MG/5ML suspension Take 30 mLs by mouth 2 (two) times daily as needed for mild constipation.     Magnesium  Oxide 420 MG TABS Take 420 mg by mouth daily.     mirtazapine  (REMERON ) 15 MG tablet Take 1 tablet (15 mg total) by mouth at bedtime. 20 tablet 0   Multiple Vitamin (MULTIVITAMIN WITH MINERALS) TABS tablet Take 1 tablet by mouth daily.     naloxone  (NARCAN )  nasal spray 4 mg/0.1 mL Place 1 spray into the nose once. Instill 1 spray into one nostril as directed for opioid overdose, call 911 immediately, administer dose, then turn person on side if no response in 2-3 minutes or person responds but relapses. Repeat using a new spray device and into the other nostril.     nitroGLYCERIN  (NITROSTAT ) 0.4 MG SL tablet Place 1 tablet (0.4 mg total) under the tongue every 5 (five) minutes as needed for chest  pain. 25 tablet 2   omeprazole (PRILOSEC) 40 MG capsule Take 40 mg by mouth daily.     oxyCODONE -acetaminophen  (PERCOCET/ROXICET) 5-325 MG tablet Take 1 tablet by mouth every 6 (six) hours as needed for severe pain (pain score 7-10). 15 tablet 0   polyethylene glycol (MIRALAX  / GLYCOLAX ) 17 g packet Take 17 g by mouth 2 (two) times daily. (Patient taking differently: Take 17 g by mouth daily as needed for mild constipation.) 14 each 0   simethicone  (MYLICON) 40 MG/0.6ML drops Take 0.6 mLs (40 mg total) by mouth every 6 (six) hours as needed for flatulence.     sodium bicarbonate  650 MG tablet Take 1 tablet (650 mg total) by mouth 2 (two) times daily.     thiamine  (VITAMIN B-1) 100 MG tablet Take 1 tablet (100 mg total) by mouth daily.       LABORATORY DATA:  I have reviewed the data as listed Lab Results  Component Value Date   WBC 1.1 (LL) 12/13/2024   HGB 9.5 (L) 12/13/2024   HCT 28.0 (L) 12/13/2024   MCV 74.7 (L) 12/13/2024   PLT 122 (L) 12/13/2024   Recent Labs    08/13/24 2146 11/09/24 1552 11/09/24 1609 12/13/24 0723 12/13/24 0741  NA 135 136 138 129* 132*  K 3.6 3.3* 3.2* 4.1 4.1  CL 105 103 103 100 102  CO2 20* 21*  --  16*  --   GLUCOSE 168* 95 97 331* 311*  BUN 21 24* 23 25* 24*  CREATININE 1.69* 1.55* 1.30* 1.48* 1.50*  CALCIUM  8.7* 9.4  --  9.1  --   GFRNONAA 44* 49*  --  52*  --   PROT 6.2* 6.2*  --  6.4*  --   ALBUMIN 2.7* 3.6  --  3.5  --   AST 199* 28  --  27  --   ALT 205* 25  --  33  --   ALKPHOS 934* 144*  --  154*  --   BILITOT 5.2* 0.3  --  0.6  --     RADIOGRAPHIC STUDIES: I have personally reviewed the radiological images as listed and agreed with the findings in the report. CT ABDOMEN PELVIS W CONTRAST Result Date: 12/13/2024 CLINICAL DATA:  Chest pain with diarrhea and abdominal pain. History of pancreatic cancer currently undergoing chemotherapy. EXAM: CT ABDOMEN AND PELVIS WITH CONTRAST TECHNIQUE: Multidetector CT imaging of the abdomen and  pelvis was performed using the standard protocol following bolus administration of intravenous contrast. RADIATION DOSE REDUCTION: This exam was performed according to the departmental dose-optimization program which includes automated exposure control, adjustment of the mA and/or kV according to patient size and/or use of iterative reconstruction technique. CONTRAST:  75mL OMNIPAQUE  IOHEXOL  350 MG/ML SOLN COMPARISON:  08/14/2024 FINDINGS: Lower chest: Mild stable cardiomegaly. Minimal calcified plaque over the descending thoracic aorta. Visualized lung bases are clear. Hepatobiliary: Interval replacement of common bile duct stent which appears in adequate position. Air seen within the more proximal common bile duct  as well as air within the nondependent portion of the gallbladder. Minimal central intrahepatic biliary air. Liver is otherwise unremarkable. Pancreas: Stable 3 cm slightly septated cystic lesion over the pancreatic tail. Previously seen 2.5 cm pancreatic head mass not as well defined as on the prior exam as there is very minimal ill-defined low-attenuation over the pancreatic head. Spleen: Normal. Adrenals/Urinary Tract: Adrenal glands are normal. Kidneys are normal in size without hydronephrosis. 4 mm calcification over the lower pole right kidney unchanged. Metallic density over the lower pole left kidney unchanged. Resolution of previous seen left perinephric hematoma. Ureters and bladder are normal. Stomach/Bowel: Stomach is within normal. Small bowel is normal. Appendix is normal. Air and fluid throughout the colon which is otherwise unremarkable. No free peritoneal air. Vascular/Lymphatic: Mild calcified plaque over the abdominal aorta which is normal in caliber. Few small stable portacaval lymph nodes. No other significant adenopathy. Reproductive: Prostate is unremarkable. Other: No significant free peritoneal fluid or free peritoneal air. Musculoskeletal: Significant degenerative changes of the  spine and mild degenerate changes of the hips. Multiple stable Schmorl's nodes over the spine without definite metastatic disease. IMPRESSION: 1. No acute findings in the abdomen/pelvis. 2. Interval replacement of common bile duct stent which appears in adequate position with air within the biliary system as described 3. Stable 3 cm slightly septated cystic lesion over the pancreatic tail. Previously seen 2.5 cm pancreatic head mass not as well defined as on the prior exam as there is very minimal ill-defined low-attenuation over the pancreatic head. 4. Stable 4 mm right renal stone. 5. Aortic atherosclerosis. Aortic Atherosclerosis (ICD10-I70.0). Electronically Signed   By: Toribio Agreste M.D.   On: 12/13/2024 11:05   DG Chest 1 View Result Date: 12/13/2024 CLINICAL DATA:  Weakness. EXAM: CHEST  1 VIEW COMPARISON:  11/09/2024 FINDINGS: Right IJ Port-A-Cath unchanged. Lungs are adequately inflated and otherwise clear. Cardiomediastinal silhouette and remainder of the exam is unchanged. IMPRESSION: No active disease. Electronically Signed   By: Toribio Agreste M.D.   On: 12/13/2024 07:44     I personally spent a total of 55 minutes minutes in the care of the patient today including preparing to see the patient, getting/reviewing separately obtained history, performing a medically appropriate exam/evaluation, referring and communicating with other health care professionals, documenting clinical information in the EHR, communicating results, and coordinating care.    All questions were answered. The patient knows to call the clinic with any problems, questions or concerns. No barriers to learning was detected.  Jose JINNY Brunner, NP 1/20/202612:48 PM   ADDENDUM: Hematology/Oncology Attending: The patient is seen and examined today.  I agree with the above note.  This is a very pleasant 64 years old African-American male diagnosed with pancreatic cancer and currently receiving chemotherapy at the facility in  [place] .  The patient has been complaining of increasing fatigue and weakness as well as low-grade fever over the last few days at home.  He took Tylenol  with no significant improvement of his condition.  He also had some nausea and lack of appetite.  He presented to the emergency department at [facility] for evaluation.  CBC today showed total white blood count of 1.1 with absolute neutrophil count of 100.  He has low platelet count of 12/13/1998 and anemia with hemoglobin of 9.1 hematocrit 26.8%.  Urinalysis was unremarkable.  Chest x-ray showed no concerning acute findings. I had a lengthy discussion with the patient today about his condition. For the neutropenic fever, we  will arrange for 2 blood culture to rule out any bacteremia.  The patient will start G-CSF 300 mcg subcutaneously daily until his absolute neutrophil count is over 1500. He will be covered with broad-spectrum antibiotics with cefepime  and vancomycin . Once his neutropenic fever resolved and the patient is feeling better, he will continue his oncologic care at the [facility] facility by Dr. Tina.  There is no need to establish the patient locally at the [facility] health cancer Center because of ongoing treatment for his metastatic pancreatic cancer at the [facility] facility. Thank you for allowing me to participate in the care of this patient.  Please call if you have any questions. Disclaimer: This note was dictated with voice recognition software. Similar sounding words can inadvertently be transcribed and may be missed upon review. Jose MARLA Sherrod, MD

## 2024-12-13 NOTE — Progress Notes (Signed)
 Pharmacy Antibiotic Note  Jose [name] is a 64 y.o. male for which pharmacy has been consulted for cefepime  and vancomycin  dosing for febrile neutropenia.  SCr 1.5 - near baseline WBC 1.1; LA 0.8; T 103; HR 139; RR 23 COVID neg / flu neg  Flagyl  x 1 in ED  Plan: Cefepime  2g q12hr  Vancomycin  1750 mg once then 1250 mg q24hr (eAUC 505.1) unless change in renal function Monitor WBC, fever, renal function, cultures De-escalate when able Levels at steady state  Height: 5' 11 (180.3 cm) Weight: 73.5 kg (162 lb) IBW/kg (Calculated) : 75.3  Temp (24hrs), Avg:100.2 F (37.9 C), Min:98.2 F (36.8 C), Max:103 F (39.4 C)  Recent Labs  Lab 12/13/24 0723 12/13/24 0741  WBC 1.1*  --   CREATININE 1.48* 1.50*  LATICACIDVEN  --  0.8    Estimated Creatinine Clearance: 51 mL/min (A) (by C-G formula based on SCr of 1.5 mg/dL (H)).    Allergies[1]  Microbiology results: Pending  Thank you for allowing pharmacy to be a part of this patients care.  Dorn Buttner, PharmD, BCPS 12/13/2024 1:37 PM ED Clinical Pharmacist -  [phone]       [1]  Allergies Allergen Reactions   Lisinopril -Hydrochlorothiazide  Swelling and Other (See Comments)    Causes swelling of lips   Simvastatin Other (See Comments)    ALT (SGPT) level raised, Aspartate aminotransferase serum level raised

## 2024-12-13 NOTE — ED Notes (Signed)
 Date and time results received: 12/13/24 0905  (use smartphrase .now to insert current time)  Test: WBC 1.1 & Neutro Abs 0.1  Name of Provider Notified: Dr. Jerrol  Orders Received? Or Actions Taken?: see chart

## 2024-12-13 NOTE — Inpatient Diabetes Management (Signed)
 Inpatient Diabetes Program Recommendations  AACE/ADA: New Consensus Statement on Inpatient Glycemic Control (2015)  Target Ranges:  Prepandial:   less than 140 mg/dL      Peak postprandial:   less than 180 mg/dL (1-2 hours)      Critically ill patients:  140 - 180 mg/dL   Lab Results  Component Value Date   GLUCAP 140 (H) 07/09/2023   HGBA1C 7.7 (H) 04/24/2023    Review of Glycemic Control  Latest Reference Range & Units 12/13/24 07:23 12/13/24 07:41  Glucose 70 - 99 mg/dL 668 (H) 688 (H)   Diabetes history: DM 2 Outpatient Diabetes medications:  Novolog  0-9 units tid with meals Lantus  25 units daily Current orders for Inpatient glycemic control:  None  Inpatient Diabetes Program Recommendations:    Note patient is on insulin  at home.  Consider adding Lantus  18 units daily plus Novolog  0-9 units q 4 hours while in the hospital.   Thanks,  Randall Bullocks, RN, BC-ADM Inpatient Diabetes Coordinator Pager [phone]  (8a-5p)

## 2024-12-13 NOTE — H&P (Addendum)
 " Date: 12/13/2024               Patient Name:  Carole Deere MRN: [id number]  DOB: 06/27/1961 Age / Sex: 64 y.o., male   PCP: [facility]         Medical Service: Internal Medicine Teaching Service         Attending Physician: Dr. Shawn Sick, MD      First Contact: Dr. Doyal Miyamoto, MD    Second Contact: Dr. Lonni Africa, DO         After Hours (After 5p/  First Contact Pager: [phone]  weekends / holidays): Second Contact Pager: [phone]   SUBJECTIVE   Chief Complaint: Abdominal pain  History of Present Illness:  Boby Junior Loewen is a 64 year old male with a past medical history of pancreatic cancer (adenocarcinoma) currently being treated with chemotherapy (gemcitabine and paclitaxel), type 2 diabetes, hypertension, COPD, HLD, CAD, diabetic nephropathy with nodular glomerulosclerosis, CKD stage IIIb, and gout who presents with persistent nausea, vomiting, and lower abdominal pain after chemotherapy infusion on Friday, 12/09/2024.  It appears that he started chemotherapy in September 2025 and is currently on cycle 3.  Following his infusion on Friday he had nausea and vomiting and was unable to keep down any solids or liquids since Friday.  He also has had constant lower abdominal/suprapubic pain that feels sharp and can sometimes get worse when having a bowel movement and improves some after his bowel movement.  Last bowel movement was yesterday.  He denies any other associated symptoms including urinary frequency, urinary urgency, dysuria, hematuria, flank pain, cough, dyspnea, sick contacts.  He last took his medicines on either Friday or Saturday.  Further history is limited by patient's mentation.  Lung nodule, 2 mm, left pulmonary apex from CT thorax without contrast 12/07/2024  ED Course: Presented as above initially with normal vital signs and then became hypertensive.  Lab work showed absolute leukopenia with white blood cell count 1100, absolute neutrophil  count 100, microcytic anemia with hemoglobin of 9.2, mild thrombocytopenia with platelets of 122, mild hyponatremia with a sodium of 129, low bicarb at 16, glucose of 331, creatinine 1.48, alkaline phosphatase of 154, normal bilirubin, and magnesium  1.6.  Troponins were flat at 24 and 21 and lactic acid was initially 0.5 then 0.8.  Flu/COVID/RSV are negative and CT of the abdomen and pelvis with IV contrast did not show any significant abnormalities, did show patent biliary stent.  Hematology/oncology were consulted who recommended admission and IMTS was paged for admission.  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 Gout Prior BKA, right  Meds:  Medication list based on available notes in care everywhere Tylenol  as needed Albuterol  as needed Amlodipine  10 mg daily Aspirin  81 mg daily Atorvastatin  80 mg daily Carvedilol  6.25 mg twice daily Clopidogrel  75 mg daily Colchicine  0.6 mg daily Vitamin B12 500 mcg daily Famotidine  20 mg daily Ferrous sulfate  325 mg every Monday/Wednesday/Friday Folic acid  1 mg daily Fluticasone /salmeterol 500-50 micrograms 1 puff twice daily Gabapentin  100 mg 3 times daily Hydroxyzine  25 mg 3 times daily as needed Insulin  glargine 25 units daily Sliding scale insulin  aspart Latanoprost  eyedrops Magnesium  oxide 4 Dron 20 mg daily Mirtazapine  15 mg daily Multivitamin Sublingual nitroglycerin  Omeprazole 40 mg daily Oxycodone /acetaminophen  5/325 mg every 6 hours as needed MiraLAX  twice daily Simethicone  40 mg every 6 hours as needed Sodium bicarb 650 mg twice daily  Thiamine  100 mg daily  Current Outpatient Medications  Medication Instructions   acetaminophen  (TYLENOL ) 1,000 mg, 3 times daily   albuterol  (PROVENTIL  HFA;VENTOLIN  HFA) 108 (90 BASE) MCG/ACT inhaler 2 puffs, Every 6 hours PRN   amitriptyline   (ELAVIL ) 25 mg, Oral, Daily at bedtime   amLODipine  (NORVASC ) 10 mg, Oral, Daily   aspirin  EC 81 mg, Daily   atorvastatin  (LIPITOR ) 80 mg, Oral, Daily   benzonatate (TESSALON) 100 mg, 3 times daily PRN   bisacodyl  (DULCOLAX) 5 mg, Oral, Daily PRN   Brinzolamide -Brimonidine  1-0.2 % SUSP 1 drop, 3 times daily   carvedilol  (COREG ) 6.25 mg, Oral, 2 times daily with meals   clopidogrel  (PLAVIX ) 75 mg, Oral, Daily with breakfast   colchicine  0.6 mg, Oral, Daily   cyanocobalamin  (VITAMIN B12) 500 mcg, Daily   cyclobenzaprine  (FLEXERIL ) 5 mg, Every 4 hours PRN   Dextrose -Fructose-Sod Citrate 4.35-4.17-0.921 GM/15ML LIQD 15 cm, As needed   famotidine  (PEPCID ) 20 mg, Oral, Daily   ferrous sulfate  325 mg, Every M-W-F   fluticasone -salmeterol (ADVAIR) 500-50 MCG/ACT AEPB 1 puff, Inhalation, 2 times daily   folic acid  (FOLVITE ) 1 mg, Oral, Daily   gabapentin  (NEURONTIN ) 100 mg, Oral, 3 times daily   hydrOXYzine  (ATARAX ) 25 mg, Oral, 3 times daily PRN   insulin  aspart (NOVOLOG ) 0-9 Units, Subcutaneous, 3 times daily with meals, Sliding scale CBG 70 - 120: 0 units CBG 121 - 150: 1 unit,  CBG 151 - 200: 2 units,  CBG 201 - 250: 3 units,  CBG 251 - 300: 5 units,  CBG 301 - 350: 7 units,  CBG 351 - 400: 9 units   CBG > 400: 9 units and notify your MD   insulin  glargine (LANTUS ) 25 Units, Subcutaneous, Daily   Insulin  Syringe-Needle U-100 30G X 1/2 0.5 ML MISC Use with Lantus    ipratropium-albuterol  (DUONEB) 0.5-2.5 (3) MG/3ML SOLN 3 mLs, Every 6 hours PRN   latanoprost  (XALATAN ) 0.005 % ophthalmic solution 1 drop, Both Eyes, Daily at bedtime   magnesium  hydroxide (MILK OF MAGNESIA) 400 MG/5ML suspension 30 mLs, 2 times daily PRN   Magnesium  Oxide 420 mg, Daily   mirtazapine  (REMERON ) 15 mg, Oral, Daily at bedtime   Multiple Vitamin (MULTIVITAMIN WITH MINERALS) TABS tablet 1 tablet, Daily   naloxone  (NARCAN ) nasal spray 4 mg/0.1 mL 1 spray,  Once   nitroGLYCERIN  (NITROSTAT ) 0.4 mg, Sublingual, Every 5 min  PRN   omeprazole (PRILOSEC) 40 mg, Daily   oxyCODONE -acetaminophen  (PERCOCET/ROXICET) 5-325 MG tablet 1 tablet, Oral, Every 6 hours PRN   polyethylene glycol (MIRALAX  / GLYCOLAX ) 17 g, Oral, 2 times daily   simethicone  (MYLICON) 40 mg, Oral, Every 6 hours PRN   sodium bicarbonate  650 mg, Oral, 2 times daily   thiamine  (VITAMIN B-1) 100 mg, Oral, Daily     Past Surgical History Past Surgical History:  Procedure Laterality Date   ABDOMINAL AORTOGRAM W/LOWER EXTREMITY N/A 04/27/2023   Procedure: ABDOMINAL AORTOGRAM W/LOWER EXTREMITY;  Surgeon: Sheree Penne Bruckner, MD;  Location: [facility] INVASIVE CV LAB;  Service: Cardiovascular;  Laterality: N/A;   AMPUTATION Right 05/30/2023   Procedure: AMPUTATION BELOW KNEE;  Surgeon: Sheree Penne Bruckner, MD;  Location: [facility] OR;  Service: Vascular;  Laterality: Right;   AMPUTATION Right 07/01/2023   Procedure: RIGHT ABOVE KNEE AMPUTATION;  Surgeon: Magda Debby SAILOR, MD;  Location: [facility];  Service: Vascular;  Laterality: Right;   APPLICATION OF WOUND VAC Right 07/01/2023   Procedure: APPLICATION OF WOUND VAC;  Surgeon: Magda Debby SAILOR, MD;  Location: [facility] OR;  Service: Vascular;  Laterality: Right;   CARDIAC CATHETERIZATION     CORONARY STENT PLACEMENT     FRACTURE SURGERY Right    hardware from right leg fracture   LEFT HEART CATH AND CORONARY ANGIOGRAPHY N/A 08/22/2020   Procedure: LEFT HEART CATH AND CORONARY ANGIOGRAPHY;  Surgeon: Claudene Victory ORN, MD;  Location: [facility] INVASIVE CV LAB;  Service: Cardiovascular;  Laterality: N/A;   LEFT HEART CATH AND CORONARY ANGIOGRAPHY N/A 01/28/2022   Procedure: LEFT HEART CATH AND CORONARY ANGIOGRAPHY;  Surgeon: Burnard Debby LABOR, MD;  Location: [facility] INVASIVE CV LAB;  Service: Cardiovascular;  Laterality: N/A;   LEFT HEART CATHETERIZATION WITH CORONARY ANGIOGRAM Bilateral 08/25/2012   Procedure: LEFT HEART CATHETERIZATION WITH CORONARY ANGIOGRAM;  Surgeon: Lonni JONETTA Cash, MD;  Location[facility] CATH LAB;  Service:  Cardiovascular;  Laterality: Bilateral;   PERIPHERAL INTRAVASCULAR LITHOTRIPSY  04/27/2023   Procedure: PERIPHERAL INTRAVASCULAR LITHOTRIPSY;  Surgeon: Sheree Penne Lonni, MD;  Location: [facility] INVASIVE CV LAB;  Service: Cardiovascular;;   PERIPHERAL VASCULAR BALLOON ANGIOPLASTY  04/27/2023   Procedure: PERIPHERAL VASCULAR BALLOON ANGIOPLASTY;  Surgeon: Sheree Penne Lonni, MD;  Location: [facility] INVASIVE CV LAB;  Service: Cardiovascular;;   TRANSMETATARSAL AMPUTATION Right 05/12/2023   Procedure: TRANSMETATARSAL AMPUTATION;  Surgeon: Eliza Lonni RAMAN, MD;  Location: [facility] OR;  Service: Vascular;  Laterality: Right;    Social:  Reportedly lives alone, ambulates with a cane and is independent in ADLs and IADLs. PCP: [facility] Substances: Did not address.  Family History:  Family History  Problem Relation Age of Onset   Heart disease Mother        MI 20s    Allergies: Allergies as of 12/13/2024 - Review Complete 12/13/2024  Allergen Reaction Noted   Lisinopril -hydrochlorothiazide  Swelling and Other (See Comments) 04/28/2019   Simvastatin Other (See Comments) 04/10/2016    Review of Systems: A complete ROS was negative except as per HPI.   OBJECTIVE:   Physical Exam: Blood pressure (!) 188/84, pulse 62, temperature 99.5 F (37.5 C), temperature source Oral, resp. rate 18, height 5' 11 (1.803 m), weight 73.5 kg, SpO2 100%.  Constitutional: Ill-appearing elderly male laying in bed.  HENT: Normocephalic, atraumatic,  Eyes: Sclera non-icteric, PERRL, EOM intact Cardio: Tachycardic with a regular rhythm. 2+ bilateral radial and left dorsalis pedis pulse pulses.  Extremities warm and well-perfused Pulm:Clear to auscultation bilaterally. Normal work of breathing on room air. Abdomen: Soft, mild diffuse tenderness, non-distended, positive bowel sounds. MSK: Trace lower extremity edema in the left leg, stable right BKA Skin:Warm and dry.  Port over right chest appears  clean without any signs of surrounding inflammation Neuro:Alert and oriented to person and place but not fully to time.  Moves all 4 extremities independently, follows commands.  No obvious focal neurodeficit noted on limited exam  Labs: CBC    Component Value Date/Time   WBC 1.1 (LL) 12/13/2024 0723   RBC 3.59 (L) 12/13/2024 0723   HGB 8.8 (L) 12/13/2024 1359   HCT 26.0 (L) 12/13/2024 1359   PLT 122 (L) 12/13/2024 0723   MCV 74.7 (L) 12/13/2024 0723   MCH 25.6 (L) 12/13/2024 0723   MCHC 34.3 12/13/2024 0723   RDW 18.6 (H) 12/13/2024 0723   LYMPHSABS 0.9 12/13/2024 0723   MONOABS 0.0 (L) 12/13/2024 0723   EOSABS 0.0 12/13/2024 0723   BASOSABS 0.0 12/13/2024 0723     CMP     Component Value Date/Time   NA 133 (L) 12/13/2024 1359   NA  131 (L) 12/14/2020 0849   K 4.2 12/13/2024 1359   CL 102 12/13/2024 0741   CO2 16 (L) 12/13/2024 0723   GLUCOSE 311 (H) 12/13/2024 0741   BUN 24 (H) 12/13/2024 0741   BUN 26 12/14/2020 0849   CREATININE 1.50 (H) 12/13/2024 0741   CALCIUM  9.1 12/13/2024 0723   PROT 6.4 (L) 12/13/2024 0723   PROT 6.6 12/14/2020 0849   ALBUMIN 3.5 12/13/2024 0723   ALBUMIN 4.5 12/14/2020 0849   AST 27 12/13/2024 0723   ALT 33 12/13/2024 0723   ALKPHOS 154 (H) 12/13/2024 0723   BILITOT 0.6 12/13/2024 0723   BILITOT 0.3 12/14/2020 0849   GFRNONAA 52 (L) 12/13/2024 0723   GFRAA 53 (L) 12/14/2020 0849    Imaging: CT ABDOMEN PELVIS W CONTRAST Result Date: 12/13/2024 CLINICAL DATA:  Chest pain with diarrhea and abdominal pain. History of pancreatic cancer currently undergoing chemotherapy. EXAM: CT ABDOMEN AND PELVIS WITH CONTRAST TECHNIQUE: Multidetector CT imaging of the abdomen and pelvis was performed using the standard protocol following bolus administration of intravenous contrast. RADIATION DOSE REDUCTION: This exam was performed according to the departmental dose-optimization program which includes automated exposure control, adjustment of the mA and/or kV  according to patient size and/or use of iterative reconstruction technique. CONTRAST:  75mL OMNIPAQUE  IOHEXOL  350 MG/ML SOLN COMPARISON:  08/14/2024 FINDINGS: Lower chest: Mild stable cardiomegaly. Minimal calcified plaque over the descending thoracic aorta. Visualized lung bases are clear. Hepatobiliary: Interval replacement of common bile duct stent which appears in adequate position. Air seen within the more proximal common bile duct as well as air within the nondependent portion of the gallbladder. Minimal central intrahepatic biliary air. Liver is otherwise unremarkable. Pancreas: Stable 3 cm slightly septated cystic lesion over the pancreatic tail. Previously seen 2.5 cm pancreatic head mass not as well defined as on the prior exam as there is very minimal ill-defined low-attenuation over the pancreatic head. Spleen: Normal. Adrenals/Urinary Tract: Adrenal glands are normal. Kidneys are normal in size without hydronephrosis. 4 mm calcification over the lower pole right kidney unchanged. Metallic density over the lower pole left kidney unchanged. Resolution of previous seen left perinephric hematoma. Ureters and bladder are normal. Stomach/Bowel: Stomach is within normal. Small bowel is normal. Appendix is normal. Air and fluid throughout the colon which is otherwise unremarkable. No free peritoneal air. Vascular/Lymphatic: Mild calcified plaque over the abdominal aorta which is normal in caliber. Few small stable portacaval lymph nodes. No other significant adenopathy. Reproductive: Prostate is unremarkable. Other: No significant free peritoneal fluid or free peritoneal air. Musculoskeletal: Significant degenerative changes of the spine and mild degenerate changes of the hips. Multiple stable Schmorl's nodes over the spine without definite metastatic disease. IMPRESSION: 1. No acute findings in the abdomen/pelvis. 2. Interval replacement of common bile duct stent which appears in adequate position with air  within the biliary system as described 3. Stable 3 cm slightly septated cystic lesion over the pancreatic tail. Previously seen 2.5 cm pancreatic head mass not as well defined as on the prior exam as there is very minimal ill-defined low-attenuation over the pancreatic head. 4. Stable 4 mm right renal stone. 5. Aortic atherosclerosis. Aortic Atherosclerosis (ICD10-I70.0). Electronically Signed   By: Toribio Agreste M.D.   On: 12/13/2024 11:05   DG Chest 1 View Result Date: 12/13/2024 CLINICAL DATA:  Weakness. EXAM: CHEST  1 VIEW COMPARISON:  11/09/2024 FINDINGS: Right IJ Port-A-Cath unchanged. Lungs are adequately inflated and otherwise clear. Cardiomediastinal silhouette and remainder of the exam is  unchanged. IMPRESSION: No active disease. Electronically Signed   By: Toribio Agreste M.D.   On: 12/13/2024 07:44     EKG: personally reviewed my interpretation is sinus tachycardia. Consistent with prior EKG but faster.  ASSESSMENT & PLAN:   Assessment & Plan by Problem: Principal Problem:   Neutropenic fever Active Problems:   Hypertension associated with diabetes (HCC)   Dyslipidemia associated with type 2 diabetes mellitus (HCC)   Chronic kidney disease, stage 3b (HCC)   COPD (chronic obstructive pulmonary disease) (HCC)   CAD (coronary artery disease)   Diabetes (HCC)   S/P BKA (below knee amputation) unilateral, right (HCC) - on 05-30-2023   Sepsis (HCC)   Acute encephalopathy   Atlee Junior Clipper is a 64 y.o. male with pertinent PMH of pancreatic cancer (adenocarcinoma) currently being treated with chemotherapy (gemcitabine and paclitaxel), type 2 diabetes, hypertension, COPD, HLD, CAD, diabetic nephropathy with nodular glomerulosclerosis, CKD stage IIIb, and gout who presented with nausea, vomiting, and abdominal pain and is admitted for neutropenic fever and sepsis.  Neutropenic fever, absolute neutrophil count 100 Sepsis Encephalopathy Pancreatic adenocarcinoma No source of infection  currently.  CT of the abdomen and pelvis without signs of perforation, abscess, or other source.  Biliary stent appears patent.  No significant respiratory symptoms, UA without signs of infection, flu/COVID/RSV negative.  Case discussed with oncology who are considering granulocyte colony stimulating factor. Tried to reach his oncology office, however I was unable to connect with them today.  S/p 3 L LR but remains tachycardic.  No known history of heart failure and his pressure is holding steady.  We will continue to treat supportively on top of IV broad-spectrum antibiotics. Pancreatic biopsy 07/12/2024 showed adenocarcinoma. Immunoperoxidase studies performed on the core biopsy show an increased Ki67and loss of p53 in the neoplastic glands.  Currently on cycle 3 of chemotherapy with last infusion on 12/09/2024, gemcitabine and paclitaxel. It appears he has received care at [facility]. Cherylin) Ruthann Sparks [facility] of [facility] before.  - Cefepime  and vancomycin  per pharmacy - Follow-up blood cultures - Additional 1 L bolus of LR - Tylenol  as needed, can add IV if mentation does not improve - Admit to progressive - Continue to reach out to his oncology team  Type 2 diabetes Chronic metabolic acidosis CKD stage IIIb Diabetic nephropathy with glomerulosclerosis Blood sugar here is elevated at 311 and his bicarb is low at 16 however anion gap is only 13 and no acidosis on VBG.  Chart review shows that he appears to be on insulin  but there is also the recent note that had sitagliptin.  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  Review of recent metabolic panels shows that his renal function here is better than recently.  UA shows significant glucose and a small amount of ketones but low  suspicion for DKA driving his symptoms. -  very sensitive SSI while NPO - Monitor ins and outs - Daily metabolic panel  Hypertension Hyperlipidemia CAD Blood pressure initially elevated here but now normotensive.  We will hold antihypertensives for now.  No reports of chest pain and troponin slightly elevated but downtrending.  No change on EKG.  Does appear to be on dual antiplatelet therapy at home and as his mentation improves we will resume these.  COPD No significant respiratory symptoms here and he does appear to be on LABA/ICS  at home.  We will continue this while admitted and if needed will add DuoNebs. - LABA/ICS  Diet: NPO VTE: Enoxaparin  Code: Full  Dispo: Admit patient to Inpatient with expected length of stay greater than 2 midnights.  Signed: Fairy Pool, DO Internal Medicine Resident, PGY-3 Please contact the on call pager at [phone] for any urgent or emergent needs. 3:35 PM 12/13/2024 "

## 2024-12-14 ENCOUNTER — Inpatient Hospital Stay (HOSPITAL_COMMUNITY)

## 2024-12-14 DIAGNOSIS — N1832 Chronic kidney disease, stage 3b: Secondary | ICD-10-CM | POA: Diagnosis not present

## 2024-12-14 DIAGNOSIS — I129 Hypertensive chronic kidney disease with stage 1 through stage 4 chronic kidney disease, or unspecified chronic kidney disease: Secondary | ICD-10-CM | POA: Diagnosis not present

## 2024-12-14 DIAGNOSIS — J449 Chronic obstructive pulmonary disease, unspecified: Secondary | ICD-10-CM | POA: Diagnosis not present

## 2024-12-14 DIAGNOSIS — K219 Gastro-esophageal reflux disease without esophagitis: Secondary | ICD-10-CM | POA: Diagnosis not present

## 2024-12-14 DIAGNOSIS — I2510 Atherosclerotic heart disease of native coronary artery without angina pectoris: Secondary | ICD-10-CM | POA: Diagnosis not present

## 2024-12-14 DIAGNOSIS — E1122 Type 2 diabetes mellitus with diabetic chronic kidney disease: Secondary | ICD-10-CM | POA: Diagnosis not present

## 2024-12-14 DIAGNOSIS — D696 Thrombocytopenia, unspecified: Secondary | ICD-10-CM | POA: Diagnosis not present

## 2024-12-14 DIAGNOSIS — C259 Malignant neoplasm of pancreas, unspecified: Secondary | ICD-10-CM | POA: Diagnosis not present

## 2024-12-14 DIAGNOSIS — E785 Hyperlipidemia, unspecified: Secondary | ICD-10-CM | POA: Diagnosis not present

## 2024-12-14 DIAGNOSIS — S91202A Unspecified open wound of left great toe with damage to nail, initial encounter: Secondary | ICD-10-CM

## 2024-12-14 DIAGNOSIS — E639 Nutritional deficiency, unspecified: Secondary | ICD-10-CM | POA: Diagnosis not present

## 2024-12-14 DIAGNOSIS — A419 Sepsis, unspecified organism: Secondary | ICD-10-CM | POA: Diagnosis not present

## 2024-12-14 LAB — CBC WITH DIFFERENTIAL/PLATELET
Abs Immature Granulocytes: 0 K/uL (ref 0.00–0.07)
Basophils Absolute: 0 K/uL (ref 0.0–0.1)
Basophils Relative: 1 %
Eosinophils Absolute: 0 K/uL (ref 0.0–0.5)
Eosinophils Relative: 2 %
HCT: 22 % — ABNORMAL LOW (ref 39.0–52.0)
Hemoglobin: 7.8 g/dL — ABNORMAL LOW (ref 13.0–17.0)
Immature Granulocytes: 0 %
Lymphocytes Relative: 62 %
Lymphs Abs: 0.8 K/uL (ref 0.7–4.0)
MCH: 25.6 pg — ABNORMAL LOW (ref 26.0–34.0)
MCHC: 35.5 g/dL (ref 30.0–36.0)
MCV: 72.1 fL — ABNORMAL LOW (ref 80.0–100.0)
Monocytes Absolute: 0 K/uL — ABNORMAL LOW (ref 0.1–1.0)
Monocytes Relative: 1 %
Neutro Abs: 0.4 K/uL — CL (ref 1.7–7.7)
Neutrophils Relative %: 34 %
Platelets: 99 K/uL — ABNORMAL LOW (ref 150–400)
RBC: 3.05 MIL/uL — ABNORMAL LOW (ref 4.22–5.81)
RDW: 18.4 % — ABNORMAL HIGH (ref 11.5–15.5)
Smear Review: NORMAL
WBC: 1.3 K/uL — CL (ref 4.0–10.5)
nRBC: 0 % (ref 0.0–0.2)

## 2024-12-14 LAB — C DIFFICILE QUICK SCREEN W PCR REFLEX
C Diff antigen: NEGATIVE
C Diff interpretation: NOT DETECTED
C Diff toxin: NEGATIVE

## 2024-12-14 LAB — COMPREHENSIVE METABOLIC PANEL WITH GFR
ALT: 32 U/L (ref 0–44)
AST: 28 U/L (ref 15–41)
Albumin: 3.1 g/dL — ABNORMAL LOW (ref 3.5–5.0)
Alkaline Phosphatase: 197 U/L — ABNORMAL HIGH (ref 38–126)
Anion gap: 11 (ref 5–15)
BUN: 20 mg/dL (ref 8–23)
CO2: 20 mmol/L — ABNORMAL LOW (ref 22–32)
Calcium: 8.6 mg/dL — ABNORMAL LOW (ref 8.9–10.3)
Chloride: 103 mmol/L (ref 98–111)
Creatinine, Ser: 1.22 mg/dL (ref 0.61–1.24)
GFR, Estimated: >60 mL/min
Glucose, Bld: 137 mg/dL — ABNORMAL HIGH (ref 70–99)
Potassium: 3.5 mmol/L (ref 3.5–5.1)
Sodium: 134 mmol/L — ABNORMAL LOW (ref 135–145)
Total Bilirubin: 0.5 mg/dL (ref 0.0–1.2)
Total Protein: 5.7 g/dL — ABNORMAL LOW (ref 6.5–8.1)

## 2024-12-14 LAB — GLUCOSE, CAPILLARY
Glucose-Capillary: 123 mg/dL — ABNORMAL HIGH (ref 70–99)
Glucose-Capillary: 183 mg/dL — ABNORMAL HIGH (ref 70–99)
Glucose-Capillary: 168 mg/dL — ABNORMAL HIGH (ref 70–99)
Glucose-Capillary: 160 mg/dL — ABNORMAL HIGH (ref 70–99)

## 2024-12-14 LAB — TROPONIN T, HIGH SENSITIVITY
Troponin T High Sensitivity: 48 ng/L — ABNORMAL HIGH (ref 0–19)
Troponin T High Sensitivity: 45 ng/L — ABNORMAL HIGH (ref 0–19)

## 2024-12-14 LAB — MAGNESIUM: Magnesium: 1.9 mg/dL (ref 1.7–2.4)

## 2024-12-14 LAB — PHOSPHORUS: Phosphorus: 2.5 mg/dL (ref 2.5–4.6)

## 2024-12-14 MED ORDER — VITAMIN B-12 1000 MCG PO TABS
500.0000 ug | ORAL_TABLET | Freq: Every day | ORAL | Status: DC
  Administered 2024-12-14 – 2024-12-20 (×7): 500 ug via ORAL
  Filled 2024-12-14 (×7): qty 1

## 2024-12-14 MED ORDER — VANCOMYCIN HCL IN DEXTROSE 1-5 GM/200ML-% IV SOLN: 1000.0000 mg | INTRAVENOUS | Status: DC

## 2024-12-14 MED ORDER — ASPIRIN 81 MG PO TBEC
81.0000 mg | DELAYED_RELEASE_TABLET | Freq: Every day | ORAL | Status: DC
  Administered 2024-12-14 – 2024-12-20 (×7): 81 mg via ORAL
  Filled 2024-12-14 (×7): qty 1

## 2024-12-14 MED ORDER — ATORVASTATIN CALCIUM 80 MG PO TABS
80.0000 mg | ORAL_TABLET | Freq: Every day | ORAL | Status: DC
  Administered 2024-12-14 – 2024-12-20 (×7): 80 mg via ORAL
  Filled 2024-12-14 (×7): qty 1

## 2024-12-14 MED ORDER — OXYCODONE HCL 5 MG PO TABS
5.0000 mg | ORAL_TABLET | Freq: Two times a day (BID) | ORAL | Status: DC | PRN
  Administered 2024-12-14 – 2024-12-16 (×6): 5 mg via ORAL
  Filled 2024-12-14 (×7): qty 1

## 2024-12-14 MED ORDER — CLOPIDOGREL BISULFATE 75 MG PO TABS
75.0000 mg | ORAL_TABLET | Freq: Every day | ORAL | Status: DC
  Administered 2024-12-14 – 2024-12-20 (×7): 75 mg via ORAL
  Filled 2024-12-14 (×7): qty 1

## 2024-12-14 MED ORDER — MIRTAZAPINE 7.5 MG PO TABS
15.0000 mg | ORAL_TABLET | Freq: Every day | ORAL | Status: DC
  Administered 2024-12-14 – 2024-12-19 (×6): 15 mg via ORAL
  Filled 2024-12-14 (×6): qty 2

## 2024-12-14 MED ORDER — GABAPENTIN 100 MG PO CAPS
100.0000 mg | ORAL_CAPSULE | Freq: Three times a day (TID) | ORAL | Status: DC
  Administered 2024-12-14 – 2024-12-16 (×6): 100 mg via ORAL
  Filled 2024-12-14 (×6): qty 1

## 2024-12-14 MED ORDER — ENSURE PLUS HIGH PROTEIN PO LIQD
237.0000 mL | Freq: Two times a day (BID) | ORAL | Status: DC
  Administered 2024-12-14 – 2024-12-20 (×8): 237 mL via ORAL

## 2024-12-14 MED ORDER — FOLIC ACID 1 MG PO TABS
1.0000 mg | ORAL_TABLET | Freq: Every day | ORAL | Status: DC
  Administered 2024-12-14 – 2024-12-20 (×7): 1 mg via ORAL
  Filled 2024-12-14 (×7): qty 1

## 2024-12-14 MED ORDER — CHLORHEXIDINE GLUCONATE CLOTH 2 % EX PADS
6.0000 | MEDICATED_PAD | Freq: Every day | CUTANEOUS | Status: DC
  Administered 2024-12-14 – 2024-12-20 (×7): 6 via TOPICAL

## 2024-12-14 MED ORDER — SODIUM CHLORIDE (PF) 0.9 % IJ SOLN
INTRAMUSCULAR | Status: AC
  Administered 2024-12-14: 10 mL
  Filled 2024-12-14: qty 10

## 2024-12-14 MED ORDER — THIAMINE MONONITRATE 100 MG PO TABS
100.0000 mg | ORAL_TABLET | Freq: Every day | ORAL | Status: DC
  Administered 2024-12-14 – 2024-12-20 (×7): 100 mg via ORAL
  Filled 2024-12-14 (×7): qty 1

## 2024-12-14 MED ORDER — SODIUM CHLORIDE 0.9 % IV SOLN
10.0000 mg | Freq: Once | INTRAVENOUS | Status: AC
  Administered 2024-12-14: 10 mg via INTRAVENOUS
  Filled 2024-12-14: qty 1

## 2024-12-14 NOTE — Progress Notes (Deleted)
 Pharmacy Antibiotic Note  Jose [name] is a 64 y.o. male for which pharmacy has been consulted for cefepime  and vancomycin  dosing for febrile neutropenia.  SCr 1.22 down from 1.5 at last consult  WBC 1.3; LA 1.7; T 98.4; HR 71; RR 14 COVID neg / flu neg  Flagyl  x 1 in ED  Plan: Cefepime  2g q12hr  Reduce vancomycin  to 1000 mg IV q24hrs (eAUC 455) Monitor WBC, fever, renal function, cultures De-escalate when able Levels at steady state  Height: 5' 11 (180.3 cm) Weight: 62.6 kg (138 lb 0.1 oz) IBW/kg (Calculated) : 75.3  Temp (24hrs), Avg:99.5 F (37.5 C), Min:98.3 F (36.8 C), Max:103 F (39.4 C)  Recent Labs  Lab 12/13/24 0723 12/13/24 0741 12/13/24 1400 12/13/24 1458 12/14/24 0100  WBC 1.1*  --   --   --  1.3*  CREATININE 1.48* 1.50*  --  1.33* 1.22  LATICACIDVEN  --  0.8 1.7  --   --     Estimated Creatinine Clearance: 53.4 mL/min (by C-G formula based on SCr of 1.22 mg/dL).    Allergies[1]  Microbiology results: Pending  Thank you for allowing pharmacy to be a part of this patients care.  Signe Platt, PharmD Candidate [id number]         [1]  Allergies Allergen Reactions   Lisinopril -Hydrochlorothiazide  Swelling and Other (See Comments)    Causes swelling of lips   Simvastatin Other (See Comments)    ALT (SGPT) level raised, Aspartate aminotransferase serum level raised

## 2024-12-14 NOTE — Consult Note (Addendum)
" °  CLINICAL SUPPORT TEAM - WOUND OSTOMY AND CONTINENCE TEAM  CONSULTATION SERVICES   WOC Nurse-Inpatient Note   This remote consultation was conducted using pertinent information was reviewed in order to determine recommendations. Coordinated care with primary nurse via Secure Chat.   WOC Nurse Consult Note: Reason for Consult: missing nail Left toe detached nail perhaps related to trauma Wound type:anonychia Pressure Injury POA: No Measurement: see flowsheet Wound bed: atrophic tissue, some non-viable tissue, maroon discoloration center of nail bed, edema and dry blood along the eponychium  Drainage has dessicate appearance  Periwound: intact R AKA Dressing procedure/placement/frequency:  Left Toe detached nail: cleanse wound with normal saline pat dry with gauze and apply 2 layers of Xeroform yellow gauze over wound, cover with 4x4 and rolled Kerlix gauze, secure in place with tape.    Left foot 12/14/24   Patient may benefit from podiatry assessment.  WOC will not follow and will remove patient from census task list. Please reconsult if wound worsens in condition and notify provider.   Sherrilyn Hals MSN RN [facility]  [phone] (Available from 7-3 pm Mon-Friday)      "

## 2024-12-14 NOTE — Plan of Care (Signed)
  Problem: Coping: Goal: Ability to adjust to condition or change in health will improve Outcome: Progressing   Problem: Fluid Volume: Goal: Ability to maintain a balanced intake and output will improve Outcome: Progressing   Problem: Health Behavior/Discharge Planning: Goal: Ability to manage health-related needs will improve Outcome: Progressing   Problem: Metabolic: Goal: Ability to maintain appropriate glucose levels will improve Outcome: Progressing

## 2024-12-14 NOTE — Progress Notes (Signed)
 "  HD#1 SUBJECTIVE:  Patient Summary: Jose [name] is a 64 y.o. male with a history of pancreatic cancer (adenocarcinoma) currently being treated with chemotherapy (gemcitabine and paclitaxel), type 2 diabetes, hypertension, COPD, HLD, CAD, diabetic nephropathy with nodular glomerulosclerosis, CKD stage IIIb, and gout who presented with nausea, vomiting, and abdominal pain and is admitted for neutropenic fever and sepsis.   Overnight Events:  Passed bedside swallow, received Tylenol  for pain. Given Pepcid  and Zofran  for nausea. Around 2200 had some brief self-limiting chest pain with stable vitals, flat Trops and EKG without evidence of acute ischemia.   Interim History:  Patient resting comfortably in bed. Mentation much improved compared to mid-day yesterday. He reports that his nausea and abdominal pain are much better today as well after receiving Pepcid  and Zofran  overnight. He denies chest pain. He does mention that his left Great toenail fell off a while ago and he is having some toe pain from that, but otherwise no pain elsewhere currently. He did have 1 soft BM this morning which per RN was green/brown in color.   OBJECTIVE:  Vital Signs: Vitals:   12/13/24 2100 12/13/24 2130 12/13/24 2300 12/14/24 0440  BP: (!) 147/81 (!) 155/91 (!) 114/55   Pulse: 78 68 84   Resp: 12 (!) 22 15 17   Temp:  98.5 F (36.9 C) 98.3 F (36.8 C) 98.5 F (36.9 C)  TempSrc:  Oral Oral Oral  SpO2: 97% 98% 97%   Weight:    62.6 kg  Height:       Supplemental O2: Room Air SpO2: 97 %  Filed Weights   12/13/24 0655 12/14/24 0440  Weight: 73.5 kg 62.6 kg     Intake/Output Summary (Last 24 hours) at 12/14/2024 9297 Last data filed at 12/14/2024 0400 Gross per 24 hour  Intake 2098.67 ml  Output 1300 ml  Net 798.67 ml   Net IO Since Admission: 798.67 mL [12/14/24 0702]  Physical Exam:  Constitutional: well-appearing male lying in hospital bed, in no acute distress HEENT: normocephalic  atraumatic, mucous membranes moist Cardiovascular: regular rate and rhythm Pulmonary/Chest: normal work of breathing on room air, lungs clear to auscultation bilaterally Abdominal: soft, non-tender to light palpation, mildly tender epigastric and RUQ to deep palpation, non-distended MSK: normal bulk and tone. Right BKA without wounds on RLE. Left Great toe nail unroofed without evidence of erythema or purulence     Neurological: alert & oriented x 3 Skin: warm and dry Psych: mood calm, behavior normal, thought content normal, judgement normal    Patient Lines/Drains/Airways Status     Active Line/Drains/Airways     Name Placement date Placement time Site Days   Implanted Port 10/10/24 Right Chest 10/10/24  1000  Chest  65   Negative Pressure Wound Therapy Leg Right 07/01/23  0931  --  532   Wound 12/13/24 2000  Toe (Comment  which one) Left 12/13/24  2000  Toe (Comment  which one)  1            Pertinent labs and imaging:      Latest Ref Rng & Units 12/14/2024    1:00 AM 12/13/2024    1:59 PM 12/13/2024    7:41 AM  CBC  WBC 4.0 - 10.5 K/uL 1.3     Hemoglobin 13.0 - 17.0 g/dL 7.8  8.8  9.5   Hematocrit 39.0 - 52.0 % 22.0  26.0  28.0   Platelets 150 - 400 K/uL 99  Latest Ref Rng & Units 12/14/2024    1:00 AM 12/13/2024    2:58 PM 12/13/2024    1:59 PM  CMP  Glucose 70 - 99 mg/dL 862  730    BUN 8 - 23 mg/dL 20  22    Creatinine 9.38 - 1.24 mg/dL 8.77  8.66    Sodium 864 - 145 mmol/L 134  130  133   Potassium 3.5 - 5.1 mmol/L 3.5  4.0  4.2   Chloride 98 - 111 mmol/L 103  99    CO2 22 - 32 mmol/L 20  16    Calcium  8.9 - 10.3 mg/dL 8.6  8.4    Total Protein 6.5 - 8.1 g/dL 5.7  5.7    Total Bilirubin 0.0 - 1.2 mg/dL 0.5  0.9    Alkaline Phos 38 - 126 U/L 197  206    AST 15 - 41 U/L 28  35    ALT 0 - 44 U/L 32  35      CT HEAD WO CONTRAST ( ) Result Date: 12/13/2024 CLINICAL DATA:  History of pancreatic cancer, current Leigh receiving chemotherapy,  presenting with neutropenic fever, nausea, vomiting and lower abdominal pain. EXAM: CT HEAD WITHOUT CONTRAST TECHNIQUE: Contiguous axial images were obtained from the base of the skull through the vertex without intravenous contrast. RADIATION DOSE REDUCTION: This exam was performed according to the departmental dose-optimization program which includes automated exposure control, adjustment of the mA and/or kV according to patient size and/or use of iterative reconstruction technique. COMPARISON:  Apr 05, 2013 FINDINGS: Brain: There is generalized cerebral atrophy with widening of the extra-axial spaces and ventricular dilatation. There are areas of decreased attenuation within the white matter tracts of the supratentorial brain, consistent with microvascular disease changes. Vascular: Marked severity bilateral cavernous carotid artery calcification is noted. Skull: Normal. Negative for fracture or focal lesion. Sinuses/Orbits: There is mild bilateral ethmoid sinus mucosal thickening. Other: None. IMPRESSION: 1. Generalized cerebral atrophy with chronic white matter small vessel ischemic changes. 2. No acute intracranial abnormality. 3. Mild bilateral ethmoid sinus disease. Electronically Signed   By: Suzen Dials M.D.   On: 12/13/2024 17:06   CT CHEST WO CONTRAST Result Date: 12/13/2024 CLINICAL DATA:  Fever. EXAM: CT CHEST WITHOUT CONTRAST TECHNIQUE: Multidetector CT imaging of the chest was performed following the standard protocol without IV contrast. RADIATION DOSE REDUCTION: This exam was performed according to the departmental dose-optimization program which includes automated exposure control, adjustment of the mA and/or kV according to patient size and/or use of iterative reconstruction technique. COMPARISON:  Chest CT dated 06/16/2020. FINDINGS: Evaluation of this exam is limited in the absence of intravenous contrast. Cardiovascular: There is no cardiomegaly or pericardial effusion. Advanced  coronary vascular calcification. Right-sided Port-A-Cath with tip in the right atrium close to the cavoatrial junction. Mild atherosclerotic calcification of the thoracic aorta. No aneurysmal dilatation. The central pulmonary arteries are grossly unremarkable. Mediastinum/Nodes: No obvious hilar or mediastinal adenopathy. The esophagus is grossly unremarkable. No mediastinal fluid collection. Lungs/Pleura: Minimal bibasilar subpleural atelectasis. No focal consolidation, pleural effusion or pneumothorax. The central airways are patent. Upper Abdomen: See report for CT abdomen pelvis. Musculoskeletal: No acute osseous pathology. IMPRESSION: 1. No acute intrathoracic pathology. 2. Advanced coronary vascular calcification. 3.  Aortic Atherosclerosis (ICD10-I70.0). Electronically Signed   By: Vanetta Chou M.D.   On: 12/13/2024 16:58   CT ABDOMEN PELVIS W CONTRAST Result Date: 12/13/2024 CLINICAL DATA:  Chest pain with diarrhea and abdominal pain. History of pancreatic  cancer currently undergoing chemotherapy. EXAM: CT ABDOMEN AND PELVIS WITH CONTRAST TECHNIQUE: Multidetector CT imaging of the abdomen and pelvis was performed using the standard protocol following bolus administration of intravenous contrast. RADIATION DOSE REDUCTION: This exam was performed according to the departmental dose-optimization program which includes automated exposure control, adjustment of the mA and/or kV according to patient size and/or use of iterative reconstruction technique. CONTRAST:  75mL OMNIPAQUE  IOHEXOL  350 MG/ML SOLN COMPARISON:  08/14/2024 FINDINGS: Lower chest: Mild stable cardiomegaly. Minimal calcified plaque over the descending thoracic aorta. Visualized lung bases are clear. Hepatobiliary: Interval replacement of common bile duct stent which appears in adequate position. Air seen within the more proximal common bile duct as well as air within the nondependent portion of the gallbladder. Minimal central intrahepatic  biliary air. Liver is otherwise unremarkable. Pancreas: Stable 3 cm slightly septated cystic lesion over the pancreatic tail. Previously seen 2.5 cm pancreatic head mass not as well defined as on the prior exam as there is very minimal ill-defined low-attenuation over the pancreatic head. Spleen: Normal. Adrenals/Urinary Tract: Adrenal glands are normal. Kidneys are normal in size without hydronephrosis. 4 mm calcification over the lower pole right kidney unchanged. Metallic density over the lower pole left kidney unchanged. Resolution of previous seen left perinephric hematoma. Ureters and bladder are normal. Stomach/Bowel: Stomach is within normal. Small bowel is normal. Appendix is normal. Air and fluid throughout the colon which is otherwise unremarkable. No free peritoneal air. Vascular/Lymphatic: Mild calcified plaque over the abdominal aorta which is normal in caliber. Few small stable portacaval lymph nodes. No other significant adenopathy. Reproductive: Prostate is unremarkable. Other: No significant free peritoneal fluid or free peritoneal air. Musculoskeletal: Significant degenerative changes of the spine and mild degenerate changes of the hips. Multiple stable Schmorl's nodes over the spine without definite metastatic disease. IMPRESSION: 1. No acute findings in the abdomen/pelvis. 2. Interval replacement of common bile duct stent which appears in adequate position with air within the biliary system as described 3. Stable 3 cm slightly septated cystic lesion over the pancreatic tail. Previously seen 2.5 cm pancreatic head mass not as well defined as on the prior exam as there is very minimal ill-defined low-attenuation over the pancreatic head. 4. Stable 4 mm right renal stone. 5. Aortic atherosclerosis. Aortic Atherosclerosis (ICD10-I70.0). Electronically Signed   By: Toribio Agreste M.D.   On: 12/13/2024 11:05   DG Chest 1 View Result Date: 12/13/2024 CLINICAL DATA:  Weakness. EXAM: CHEST  1 VIEW  COMPARISON:  11/09/2024 FINDINGS: Right IJ Port-A-Cath unchanged. Lungs are adequately inflated and otherwise clear. Cardiomediastinal silhouette and remainder of the exam is unchanged. IMPRESSION: No active disease. Electronically Signed   By: Toribio Agreste M.D.   On: 12/13/2024 07:44    ASSESSMENT/PLAN:  Assessment: Principal Problem:   Neutropenic fever Active Problems:   Hypertension associated with diabetes (HCC)   Dyslipidemia associated with type 2 diabetes mellitus (HCC)   Chronic kidney disease, stage 3b (HCC)   COPD (chronic obstructive pulmonary disease) (HCC)   CAD (coronary artery disease)   Diabetes (HCC)   S/P BKA (below knee amputation) unilateral, right (HCC) - on 05-30-2023   Sepsis (HCC)   Acute encephalopathy  Jose [name] is a 64 y.o. male with a history of pancreatic cancer (adenocarcinoma) currently being treated with chemotherapy (gemcitabine and paclitaxel), type 2 diabetes, hypertension, COPD, HLD, CAD, diabetic nephropathy with nodular glomerulosclerosis, CKD stage IIIb, and gout who presented with nausea, vomiting, and abdominal pain and is admitted for neutropenic  fever and sepsis.   Plan: #Neutropenic fever, absolute neutrophil count 100 #Sepsis #Encephalopathy #Pancreatic adenocarcinoma No source of infection currently.  CT of the abdomen and pelvis without signs of perforation, abscess, or other source.  Biliary stent appears patent.  No significant respiratory symptoms, UA without signs of infection, flu/COVID/RSV negative.  Case discussed with oncology who are considering granulocyte colony stimulating factor. Follows with outpatient Oncologist Dr. Tina at [facility]. Pancreatic biopsy 07/12/2024 showed adenocarcinoma. Immunoperoxidase studies performed on the core biopsy show an increased Ki67and loss of p53 in the neoplastic glands.  Currently on cycle 3 of chemotherapy with last infusion on 12/09/2024, gemcitabine and paclitaxel. Today with  improvement of leukopenia 1.3 and Neurophils 0.4. He has been afebrile since 1/20 fever. So far no source for infectious cause. Will plan to discontinue Vanc and continue Cefepime  for now. Will follow stool cultures.  - Oncology consulted, appreciate assistance  Culture Data:  - 1/20 flu/COVID/RSV: negative  - 1/20 Blood Cultures: NGTD < 24 hrs, final pending   - 1/20: GI Pathogen panel: Needs to be collected  - 1/20: C. diff: Needs to be collected  Antibiotic Data:  - s/p IV Metronidazole  500 mg once (i-d1/20)  - s/p IV Vanc q24 hrs (i1/20 - d1/21)   - IV Cefepime  2 g q12 hrs (i1/21 - ), expect for 72 hours unless worsening clinically - s/p IVF LR 1L bolus x4 - Hold Tylenol  if able today to assess fever curve  - Admit to progressive - Pepcid  - Zofran  - Oxycodone  5 mg q12 prn severe pain    #Type 2 diabetes #Chronic metabolic acidosis #CKD stage IIIb #Diabetic nephropathy with glomerulosclerosis Home diabetes regimen includes insulin  glargine 25 units daily. 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 Sodium bicarb 650 mg twice daily is listed on his home medication list and on admission bicarb low at 16, though improved to 20 today. Will continue to monitor.  - SSI - Hold home insulin  glargine 25 units daily while NPO and add as clinically indicated - I/Os - Daily BMP - Resume Gabapentin  100 mg TID  - Hold home Sodium bicarb 650 mg twice daily   #Hypertension #Hyperlipidemia #CAD Blood pressure initially elevated here but now normotensive.  We will hold antihypertensives for now.  No reports of chest pain today. Will plan to resume home medications Aspirin  81 mg daily, Plavix  75 mg daily, and Atorvastatin  80 mg daily.  - Resume home Aspirin  81 mg - Resume home Plavix  75 mg daily  -  Resumes home Atorvastatin  80 mg daily - Hold home Amlodipine  10 mg daily - Hold home Carvedilol  6.25 mg twice daily - Sublingual nitroglycerin   #COPD No significant respiratory symptoms here and he does appear to be on LABA/ICS at home.  We will continue this while admitted and if needed will add DuoNebs. -  Continue home LABA/ICS - Duonebs prn   #Protein Calorie Deficit #Nutritional deficiencies  Patient takes home Ensure, Folic acid  1 mg daily, Vitamin B12 500 mcg daily, Mirtazapine  15 mg daily, and Thiamine  100 mg daily. Will resume these and allow him to drink as tolerated starting with CLD.  - Resume home Folic acid  1 mg daily - Resume home Vitamin B12 500 mcg daily - Resume home Mirtazapine  15 mg daily - Resume home Thiamine  100 mg daily  #GERD Takes home Omeprazole 40 mg daily.  Giving Protonix  40 mg BID while admitted.  - Protonix  40 mg BID   #Left Great toenail unroofing Patient has unroofed left Great toenail. Picture in media tab. Does not appear with erythema or drainage. He reports the toenail fell off recently. Seen [facility] in [facility] ED for left foot pain and swelling where X-rays obtained unremarkable for osteomyelitis or fracture. He was given Colchicine  for concern of possible gout flare. No mention of whether toenail was present or not at that time. Given neutropenia and so far unclear infectious work up, will repeat left foot X-ray as toenail fell off recently.  - f/u Left foot X-ray   Best Practice: Diet: Clear liquid diet VTE: enoxaparin  (LOVENOX ) injection 40 mg Start: 12/13/24 1245 Code: Full  Disposition planning: Family Contact: Niece [place], unable to be notified. Called x2 with no answer and no VM set up.  DISPO: Anticipated discharge in 2-3 days to pending pending clinical improvement.  Signature:  Doyal Miyamoto, MD [facility]  7:02 AM, 12/14/2024  On Call pager [phone]  "

## 2024-12-14 NOTE — Progress Notes (Signed)
 Patient has a right chest port currently accessed. Flushed and has good blood return. Antimicrobial disc in place. Dressing clean dry and intact. CHG bath done this AM.

## 2024-12-14 NOTE — TOC Initial Note (Signed)
 Transition of Care [facility]) - Initial/Assessment Note    Patient Details  Name: Jose [name] MRN: [id number] Date of Birth: [birth year]  [facility] CM/SW Contact:    Roxie KANDICE Stain, RN Phone Number: 12/14/2024, 3:17 PM  Clinical Narrative:                  Spoke to patient regarding transition needs. Patient lives alone but has family support from nieces. Patient has all needed DME. Patient gets his care at the [facility].  Dr. Leron and CSW Nat [phone] ext [phone]. [facility]. ICM-inpatient case management will continue to follow for needs.  Expected Discharge Plan: Home/Self Care     Patient Goals and CMS Choice Patient states their goals for this hospitalization and ongoing recovery are:: Get better          Expected Discharge Plan and Services       Living arrangements for the past 2 months: Apartment                                      Prior Living Arrangements/Services Living arrangements for the past 2 months: Apartment Lives with:: Self Patient language and need for interpreter reviewed:: Yes Do you feel safe going back to the place where you live?: Yes      Need for Family Participation in Patient Care: Yes (Comment) Care giver support system in place?: Yes (comment)   Criminal Activity/Legal Involvement Pertinent to Current Situation/Hospitalization: No - Comment as needed  Activities of Daily Living   ADL Screening (condition at time of admission) Independently performs ADLs?: Yes (appropriate for developmental age) Is the patient deaf or have difficulty hearing?: No Does the patient have difficulty seeing, even when wearing glasses/contacts?: No Does the patient have difficulty concentrating, remembering, or making decisions?: No  Permission Sought/Granted                  Emotional Assessment Appearance:: Appears stated age Attitude/Demeanor/Rapport: Engaged Affect  (typically observed): Accepting Orientation: : Oriented to Situation, Oriented to  Time, Oriented to Self, Oriented to Place Alcohol  / Substance Use: Not Applicable Psych Involvement: No (comment)  Admission diagnosis:  Neutropenic fever [D70.9, R50.81] Abdominal pain, unspecified abdominal location [R10.9] Patient Active Problem List   Diagnosis Date Noted   Neutropenic fever 12/13/2024   Sepsis (HCC) 12/13/2024   Acute encephalopathy 12/13/2024   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) 06/25/2023   Cellulitis 06/12/2023   No appetite 06/09/2023   S/P BKA (below knee amputation) unilateral, right (HCC) - on 05-30-2023 06/07/2023   Protein-calorie malnutrition, severe 06/03/2023   Nonhealing surgical wound, initial encounter 05/27/2023   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 05/09/2023   Pancreatic mass 05/09/2023   Constipation 05/09/2023   Glaucoma 05/09/2023   PTSD (post-traumatic stress disorder) 08/14/2022   Nightmares associated with chronic post-traumatic stress disorder 08/14/2022   Major depressive disorder, 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/11/2022   Abnormality of aortic valve 01/29/2022   Depression    Hypertensive retinopathy of both eyes 02/01/2019   Chronic kidney disease, stage 3b (HCC) 08/28/2017   COPD (chronic obstructive pulmonary disease) (HCC) 08/28/2017   CAD (coronary artery disease) 04/10/2016   Hyperlipidemia 04/10/2016   Benign prostatic hyperplasia 04/10/2016   Diabetes (HCC) 04/10/2016   Type 2 diabetes mellitus with complication, with long-term current use of insulin  (HCC) 10/17/2015  Dyslipidemia associated with type 2 diabetes mellitus (HCC) 10/17/2015   Chronic hyponatremia - baseline 125-134 05/12/2015   Hypertension associated with diabetes [facility])    [facility]  [facility]   [facility] - [place], [place] - [address]  [place] [address] Phone: [phone] Fax: [phone]  [id number] - [place], [place] - [address] AT [facility] [address] [place] [address] Phone: [phone] Fax: [phone]     Social Drivers of Health (SDOH) Social History: SDOH Screenings   Food Insecurity: No Food Insecurity (12/13/2024)  Housing: Low Risk (12/13/2024)  Transportation Needs: No Transportation Needs (12/13/2024)  Utilities: Not At Risk (12/13/2024)  Depression (PHQ2-9): High Risk (08/11/2022)  Social Connections: Unknown (12/13/2024)  Tobacco Use: Medium Risk (12/13/2024)   SDOH Interventions:     Readmission Risk Interventions    05/29/2023    2:21 PM 04/29/2023    1:09 PM  Readmission Risk Prevention Plan  Transportation Screening Complete Complete  Medication Review (RN Care Manager) Complete Complete  PCP or Specialist appointment within 3-5 days of discharge Complete   HRI or Home Care Consult Complete Complete  SW Recovery Care/Counseling Consult Complete Complete  Palliative Care Screening Not Applicable Not Applicable  Skilled Nursing Facility Complete Not Applicable

## 2024-12-15 DIAGNOSIS — D696 Thrombocytopenia, unspecified: Secondary | ICD-10-CM | POA: Diagnosis not present

## 2024-12-15 DIAGNOSIS — E1122 Type 2 diabetes mellitus with diabetic chronic kidney disease: Secondary | ICD-10-CM | POA: Diagnosis not present

## 2024-12-15 DIAGNOSIS — I129 Hypertensive chronic kidney disease with stage 1 through stage 4 chronic kidney disease, or unspecified chronic kidney disease: Secondary | ICD-10-CM | POA: Diagnosis not present

## 2024-12-15 DIAGNOSIS — A419 Sepsis, unspecified organism: Secondary | ICD-10-CM | POA: Diagnosis not present

## 2024-12-15 LAB — CBC WITH DIFFERENTIAL/PLATELET
Abs Immature Granulocytes: 0.04 K/uL (ref 0.00–0.07)
Basophils Absolute: 0 K/uL (ref 0.0–0.1)
Basophils Relative: 1 %
Eosinophils Absolute: 0 K/uL (ref 0.0–0.5)
Eosinophils Relative: 2 %
HCT: 22.1 % — ABNORMAL LOW (ref 39.0–52.0)
Hemoglobin: 7.9 g/dL — ABNORMAL LOW (ref 13.0–17.0)
Immature Granulocytes: 2 %
Lymphocytes Relative: 47 %
Lymphs Abs: 0.9 K/uL (ref 0.7–4.0)
MCH: 26.2 pg (ref 26.0–34.0)
MCHC: 35.7 g/dL (ref 30.0–36.0)
MCV: 73.2 fL — ABNORMAL LOW (ref 80.0–100.0)
Monocytes Absolute: 0.1 K/uL (ref 0.1–1.0)
Monocytes Relative: 3 %
Neutro Abs: 0.8 K/uL — ABNORMAL LOW (ref 1.7–7.7)
Neutrophils Relative %: 45 %
Platelets: 91 K/uL — ABNORMAL LOW (ref 150–400)
RBC: 3.02 MIL/uL — ABNORMAL LOW (ref 4.22–5.81)
RDW: 18.6 % — ABNORMAL HIGH (ref 11.5–15.5)
WBC: 1.9 K/uL — ABNORMAL LOW (ref 4.0–10.5)
nRBC: 0 % (ref 0.0–0.2)

## 2024-12-15 LAB — GASTROINTESTINAL PANEL BY PCR, STOOL (REPLACES STOOL CULTURE)

## 2024-12-15 LAB — GLUCOSE, CAPILLARY
Glucose-Capillary: 118 mg/dL — ABNORMAL HIGH (ref 70–99)
Glucose-Capillary: 146 mg/dL — ABNORMAL HIGH (ref 70–99)
Glucose-Capillary: 148 mg/dL — ABNORMAL HIGH (ref 70–99)
Glucose-Capillary: 165 mg/dL — ABNORMAL HIGH (ref 70–99)
Glucose-Capillary: 179 mg/dL — ABNORMAL HIGH (ref 70–99)
Glucose-Capillary: 193 mg/dL — ABNORMAL HIGH (ref 70–99)
Glucose-Capillary: 210 mg/dL — ABNORMAL HIGH (ref 70–99)

## 2024-12-15 LAB — COMPREHENSIVE METABOLIC PANEL WITH GFR
ALT: 31 U/L (ref 0–44)
AST: 28 U/L (ref 15–41)
Albumin: 3 g/dL — ABNORMAL LOW (ref 3.5–5.0)
Alkaline Phosphatase: 225 U/L — ABNORMAL HIGH (ref 38–126)
Anion gap: 11 (ref 5–15)
BUN: 21 mg/dL (ref 8–23)
CO2: 19 mmol/L — ABNORMAL LOW (ref 22–32)
Calcium: 8.8 mg/dL — ABNORMAL LOW (ref 8.9–10.3)
Chloride: 109 mmol/L (ref 98–111)
Creatinine, Ser: 1.35 mg/dL — ABNORMAL HIGH (ref 0.61–1.24)
GFR, Estimated: 58 mL/min — ABNORMAL LOW
Glucose, Bld: 135 mg/dL — ABNORMAL HIGH (ref 70–99)
Potassium: 3 mmol/L — ABNORMAL LOW (ref 3.5–5.1)
Sodium: 139 mmol/L (ref 135–145)
Total Bilirubin: 0.5 mg/dL (ref 0.0–1.2)
Total Protein: 5.5 g/dL — ABNORMAL LOW (ref 6.5–8.1)

## 2024-12-15 LAB — IMMATURE PLATELET FRACTION: Immature Platelet Fraction: 1.7 % (ref 1.2–8.6)

## 2024-12-15 LAB — MAGNESIUM: Magnesium: 1.9 mg/dL (ref 1.7–2.4)

## 2024-12-15 MED ORDER — SODIUM CHLORIDE (PF) 0.9 % IJ SOLN
INTRAMUSCULAR | Status: AC
  Administered 2024-12-15: 10 mL
  Filled 2024-12-15: qty 10

## 2024-12-15 MED ORDER — POTASSIUM CHLORIDE CRYS ER 20 MEQ PO TBCR
40.0000 meq | EXTENDED_RELEASE_TABLET | ORAL | Status: AC
  Administered 2024-12-15 (×2): 40 meq via ORAL
  Filled 2024-12-15 (×2): qty 2

## 2024-12-15 MED ORDER — OXYCODONE HCL 5 MG PO TABS
5.0000 mg | ORAL_TABLET | Freq: Once | ORAL | Status: AC
  Administered 2024-12-15: 5 mg via ORAL

## 2024-12-15 NOTE — Progress Notes (Addendum)
 "  HD#2 SUBJECTIVE:  Patient Summary: Jose [name] is a 64 y.o. male with a history of pancreatic cancer (adenocarcinoma) currently being treated with chemotherapy (gemcitabine and paclitaxel), type 2 diabetes, hypertension, COPD, HLD, CAD, diabetic nephropathy with nodular glomerulosclerosis, CKD stage IIIb, and gout who presented with nausea, vomiting, and abdominal pain and is admitted for neutropenic fever and sepsis, now improving on IV Cefepime .    Overnight Events:  Had increased left toe pain and was given Oxycodone  5 mg.   Interim History:  Patient resting in bed. Nausea and abdominal pain have not worsened. He is managing well with eating on CLD and would like tMain complaint is his left Great toe pain.   OBJECTIVE:  Vital Signs: Vitals:   12/14/24 1646 12/14/24 1957 12/15/24 0014 12/15/24 0426  BP: 129/88 (!) 132/57 (!) 145/61 (!) 163/59  Pulse: 94 92 62 (!) 57  Resp: 19 15 14 18   Temp: 98.2 F (36.8 C) 98.6 F (37 C) 98.6 F (37 C) 98.4 F (36.9 C)  TempSrc: Oral Oral Oral Oral  SpO2: 99% 96% 98% 96%  Weight:    61.3 kg  Height:       Supplemental O2: Room Air SpO2: 96 %  Filed Weights   12/13/24 0655 12/14/24 0440 12/15/24 0426  Weight: 73.5 kg 62.6 kg 61.3 kg     Intake/Output Summary (Last 24 hours) at 12/15/2024 0734 Last data filed at 12/14/2024 2300 Gross per 24 hour  Intake 10 ml  Output 200 ml  Net -190 ml   Net IO Since Admission: 608.67 mL [12/15/24 0734]  Physical Exam:  Constitutional: nontoxic-appearing male lying in hospital bed, in no acute distress HEENT: normocephalic atraumatic, mucous membranes moist Cardiovascular: regular rate and rhythm Pulmonary/Chest: normal work of breathing on room air, lungs clear to auscultation bilaterally Abdominal: soft, non-tender to light palpation, mildly tender epigastric and RUQ to deep palpation, non-distended MSK: normal bulk and tone. Right BKA without wounds on RLE. Left Great toe with wound  dressing in place and no drainage  Neurological: alert & oriented x 3 Skin: warm and dry Psych: mood calm, behavior normal, thought content normal, judgement normal    Patient Lines/Drains/Airways Status     Active Line/Drains/Airways     Name Placement date Placement time Site Days   Implanted Port 10/10/24 Right Chest Single Power 10/10/24  1000  Chest  66   Negative Pressure Wound Therapy Leg Right 07/01/23  0931  --  533   Wound 12/13/24 2000  Toe (Comment  which one) Left 12/13/24  2000  Toe (Comment  which one)  2            Pertinent labs and imaging:      Latest Ref Rng & Units 12/15/2024    5:02 AM 12/14/2024    1:00 AM 12/13/2024    1:59 PM  CBC  WBC 4.0 - 10.5 K/uL 1.9  1.3    Hemoglobin 13.0 - 17.0 g/dL 7.9  7.8  8.8   Hematocrit 39.0 - 52.0 % 22.1  22.0  26.0   Platelets 150 - 400 K/uL 91  99         Latest Ref Rng & Units 12/15/2024    5:02 AM 12/14/2024    1:00 AM 12/13/2024    2:58 PM  CMP  Glucose 70 - 99 mg/dL 864  862  730   BUN 8 - 23 mg/dL 21  20  22    Creatinine 0.61 - 1.24  mg/dL 8.64  8.77  8.66   Sodium 135 - 145 mmol/L 139  134  130   Potassium 3.5 - 5.1 mmol/L 3.0  3.5  4.0   Chloride 98 - 111 mmol/L 109  103  99   CO2 22 - 32 mmol/L 19  20  16    Calcium  8.9 - 10.3 mg/dL 8.8  8.6  8.4   Total Protein 6.5 - 8.1 g/dL 5.5  5.7  5.7   Total Bilirubin 0.0 - 1.2 mg/dL 0.5  0.5  0.9   Alkaline Phos 38 - 126 U/L 225  197  206   AST 15 - 41 U/L 28  28  35   ALT 0 - 44 U/L 31  32  35     DG Foot 2 Views Left Result Date: 12/14/2024 CLINICAL DATA:  New left great toenail unroofing with wound. Pain. EXAM: LEFT FOOT - 2 VIEW COMPARISON:  11/09/2024 FINDINGS: No erosive or bony destructive changes. No abnormal bone density to suggest osteomyelitis. No acute fracture. Scattered osteoarthritis, mild for age. Small plantar calcaneal spur. Extensive arterial vascular calcifications. No soft tissue gas or radiopaque foreign body. Lucency about the great toe  nail bed consistent with clinical history. IMPRESSION: 1. No radiographic evidence of osteomyelitis. 2. Lucency about the great toe nail bed consistent with clinical history of unroofing. 3. Extensive arterial vascular calcifications. Electronically Signed   By: Andrea Gasman M.D.   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 male with a history of pancreatic cancer (adenocarcinoma) currently being treated with chemotherapy (gemcitabine and paclitaxel), type 2 diabetes, hypertension, COPD, HLD, CAD, diabetic nephropathy with nodular glomerulosclerosis, CKD stage IIIb, and gout who presented with nausea, vomiting, and abdominal pain and is admitted for neutropenic fever and sepsis, now improving on IV Cefepime .    Plan: #Neutropenic fever, absolute neutrophil count 100 #Sepsis #Encephalopathy #Pancreatic adenocarcinoma #Thrombocytopenia No source of infection currently.  CT of the abdomen and pelvis without signs of perforation, abscess, or other source.  Biliary stent appears patent.  No significant respiratory symptoms, UA without signs of infection, flu/COVID/RSV negative.  Case discussed with oncology who are considering granulocyte colony stimulating factor. Follows with outpatient Oncologist Dr. Tina at [facility]. Pancreatic biopsy 07/12/2024 showed adenocarcinoma. Immunoperoxidase studies performed on the core biopsy show an increased Ki67and loss of p53 in the neoplastic glands.  Currently on cycle 3 of chemotherapy with  last infusion on 12/09/2024, gemcitabine and paclitaxel. Today with improvement of leukopenia 1.3 and Neurophils 0.4. He has been afebrile since 1/20 fever. So far no source for infectious cause. Discontinued Vanc and continue Cefepime  through tonight for completion of  72 hours, though will confirm with Oncology that they are in agreement with 72 hour course of antibiotics with no clear source and likely secondary to chemotherapy. C-diff panel negative. Will follow stool cultures. AM labs showed platelets 91; by his prior baseline does not appear to have a history of thrombocytopenia. Likely due to chemotherapy, but will order immature platelet fraction lab  to assess. Discussed with lab for add-on of this and confirmed they would draw.  - Oncology consulted, appreciate assistance  Culture Data:             - 1/20 flu/COVID/RSV: negative             - 1/20 Blood Cultures: NGTD 2 days, final pending              - 1/20: GI Pathogen panel: Pending             - 1/20: C. diff: Negative Antibiotic Data:             - s/p IV Metronidazole  500 mg once (i-d1/20)             - s/p IV Vanc q24 hrs (i1/20 - d1/21)  - IV Cefepime  2 g q12 hrs (i1/20 - ), expect for 72 hours unless worsening clinically or Oncology would like longer course   - s/p IVF LR 1L bolus x4 - Hold Tylenol  if able to assess fever curve  - Admit to progressive - Pepcid  - Zofran  - Oxycodone  5 mg q12 prn severe pain  - f/u immature platelet fraction lab   #Type 2 diabetes #Chronic metabolic acidosis #CKD stage IIIb #Diabetic nephropathy with glomerulosclerosis Home diabetes regimen includes insulin  glargine 25 units daily. 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 Sodium bicarb 650 mg twice daily is listed on his home medication list and on  admission bicarb low at 16, though improved to 20 today. Will continue to monitor.  - SSI - Hold home insulin  glargine 25 units daily while NPO and add as clinically indicated - I/Os - Daily BMP - Continue home Gabapentin  100 mg TID  - Hold home Sodium bicarb 650 mg twice daily   #Hypertension #Hyperlipidemia #CAD Blood pressure initially elevated here but now normotensive.  We will hold antihypertensives for now.  No reports of chest pain today. Will plan to resume home medications Aspirin  81 mg daily, Plavix  75 mg daily, and Atorvastatin  80 mg daily. Have been holding home amlodipine  and carvedilol  originally iso possible shock. Though BP has been normotensive to hypertensive so will resume Carvedilol  to start with.   - Continue home Aspirin  81 mg - Continue home Plavix  75 mg daily  - Continue home Atorvastatin  80 mg daily - Resume home Carvedilol  6.25 mg twice daily - Hold home Amlodipine  10 mg daily - Sublingual nitroglycerin    #COPD No significant respiratory symptoms here and he does appear to be on LABA/ICS at home.  We will continue this while admitted and if needed will add DuoNebs. - Continue home LABA/ICS - Duonebs prn    #Protein Calorie Deficit #Nutritional deficiencies  Patient takes home Ensure, Folic acid  1 mg daily, Vitamin B12 500 mcg daily, Mirtazapine  15 mg daily, and Thiamine  100 mg daily. Resumed these and allowing him to drink as tolerated starting with CLD.  - Continue home Folic acid  1 mg daily - Continue home Vitamin B12 500 mcg daily - Continue home Mirtazapine  15 mg daily - Continue home Thiamine  100 mg daily  #GERD Takes home Omeprazole 40 mg daily. Giving Protonix  40 mg BID while admitted.  - Protonix  40 mg BID    #Left Great toenail unroofing Patient has unroofed left Great toenail. Picture in media tab. Does not appear with erythema or drainage. He reports  the toenail fell off recently. Seen 11/09/2024 in the [facility] ED for left foot pain and swelling  where X-rays obtained unremarkable for osteomyelitis or fracture. He was given Colchicine  for concern of possible gout flare. No mention of whether toenail was present or not at that time. Given neutropenia and so far unclear infectious work up, repeated left foot X-ray which did not show evidence of osteomyelitis. Wound care was consulted, appreciate assistance. Plan to discharge with 7 days of Augmentin  875 mg BID to cover for cellulitis and make sure he follows up with Podiatry outpatient.  - Left Great toe wound care recs: cleanse wound with normal saline pat dry with gauze and apply 2 layers of Xeroform yellow gauze over wound, cover with 4x4 and rolled Kerlix gauze, secure in place with tape.  - Discharge with Augmentin  875 mg BID for 7 days  - f/u outpatient with Podiatry   Best Practice: Diet: Clear liquid diet VTE: enoxaparin  (LOVENOX ) injection 40 mg Start: 12/13/24 1245 Code: Full  Disposition planning: Family Contact: Niece [place]qunetta, unable to be notified.  DISPO: Anticipated discharge 1-2 to Home pending clinical improvement.  Signature:  Doyal Miyamoto, MD [facility]  7:34 AM, 12/15/2024  On Call pager [phone]  "

## 2024-12-15 NOTE — Plan of Care (Signed)
" °  Problem: Skin Integrity: Goal: Risk for impaired skin integrity will decrease Outcome: Progressing   Problem: Education: Goal: Knowledge of General Education information will improve Description: Including pain rating scale, medication(s)/side effects and non-pharmacologic comfort measures Outcome: Progressing   Problem: Health Behavior/Discharge Planning: Goal: Ability to manage health-related needs will improve Outcome: Progressing   Problem: Clinical Measurements: Goal: Respiratory complications will improve Outcome: Progressing   Problem: Activity: Goal: Risk for activity intolerance will decrease Outcome: Progressing   Problem: Coping: Goal: Level of anxiety will decrease Outcome: Progressing   "

## 2024-12-16 ENCOUNTER — Inpatient Hospital Stay (HOSPITAL_COMMUNITY)

## 2024-12-16 ENCOUNTER — Other Ambulatory Visit (HOSPITAL_COMMUNITY): Payer: Self-pay

## 2024-12-16 DIAGNOSIS — I129 Hypertensive chronic kidney disease with stage 1 through stage 4 chronic kidney disease, or unspecified chronic kidney disease: Secondary | ICD-10-CM | POA: Diagnosis not present

## 2024-12-16 DIAGNOSIS — D696 Thrombocytopenia, unspecified: Secondary | ICD-10-CM | POA: Diagnosis not present

## 2024-12-16 DIAGNOSIS — A419 Sepsis, unspecified organism: Secondary | ICD-10-CM | POA: Diagnosis not present

## 2024-12-16 DIAGNOSIS — E1122 Type 2 diabetes mellitus with diabetic chronic kidney disease: Secondary | ICD-10-CM | POA: Diagnosis not present

## 2024-12-16 LAB — BASIC METABOLIC PANEL WITH GFR
Anion gap: 10 (ref 5–15)
BUN: 21 mg/dL (ref 8–23)
CO2: 19 mmol/L — ABNORMAL LOW (ref 22–32)
Calcium: 8.9 mg/dL (ref 8.9–10.3)
Chloride: 109 mmol/L (ref 98–111)
Creatinine, Ser: 1.41 mg/dL — ABNORMAL HIGH (ref 0.61–1.24)
GFR, Estimated: 55 mL/min — ABNORMAL LOW
Glucose, Bld: 164 mg/dL — ABNORMAL HIGH (ref 70–99)
Potassium: 3.5 mmol/L (ref 3.5–5.1)
Sodium: 138 mmol/L (ref 135–145)

## 2024-12-16 LAB — GLUCOSE, CAPILLARY
Glucose-Capillary: 153 mg/dL — ABNORMAL HIGH (ref 70–99)
Glucose-Capillary: 268 mg/dL — ABNORMAL HIGH (ref 70–99)
Glucose-Capillary: 254 mg/dL — ABNORMAL HIGH (ref 70–99)
Glucose-Capillary: 131 mg/dL — ABNORMAL HIGH (ref 70–99)
Glucose-Capillary: 267 mg/dL — ABNORMAL HIGH (ref 70–99)
Glucose-Capillary: 170 mg/dL — ABNORMAL HIGH (ref 70–99)

## 2024-12-16 LAB — CBC WITH DIFFERENTIAL/PLATELET
Abs Immature Granulocytes: 0.22 K/uL — ABNORMAL HIGH (ref 0.00–0.07)
Basophils Absolute: 0 K/uL (ref 0.0–0.1)
Basophils Relative: 0 %
Eosinophils Absolute: 0.1 K/uL (ref 0.0–0.5)
Eosinophils Relative: 2 %
HCT: 22.1 % — ABNORMAL LOW (ref 39.0–52.0)
Hemoglobin: 7.8 g/dL — ABNORMAL LOW (ref 13.0–17.0)
Immature Granulocytes: 8 %
Lymphocytes Relative: 33 %
Lymphs Abs: 1 K/uL (ref 0.7–4.0)
MCH: 26.2 pg (ref 26.0–34.0)
MCHC: 35.3 g/dL (ref 30.0–36.0)
MCV: 74.2 fL — ABNORMAL LOW (ref 80.0–100.0)
Monocytes Absolute: 0.3 K/uL (ref 0.1–1.0)
Monocytes Relative: 11 %
Neutro Abs: 1.4 K/uL — ABNORMAL LOW (ref 1.7–7.7)
Neutrophils Relative %: 46 %
Platelets: 74 K/uL — ABNORMAL LOW (ref 150–400)
RBC: 2.98 MIL/uL — ABNORMAL LOW (ref 4.22–5.81)
RDW: 18.8 % — ABNORMAL HIGH (ref 11.5–15.5)
WBC: 2.9 K/uL — ABNORMAL LOW (ref 4.0–10.5)
nRBC: 1 % — ABNORMAL HIGH (ref 0.0–0.2)

## 2024-12-16 LAB — MAGNESIUM: Magnesium: 1.7 mg/dL (ref 1.7–2.4)

## 2024-12-16 MED ORDER — OXYCODONE HCL 5 MG PO TABS
5.0000 mg | ORAL_TABLET | Freq: Two times a day (BID) | ORAL | 0 refills | Status: DC | PRN
  Filled 2024-12-16: qty 14, 7d supply, fill #0

## 2024-12-16 MED ORDER — SODIUM CHLORIDE (PF) 0.9 % IJ SOLN
INTRAMUSCULAR | Status: AC
  Administered 2024-12-16: 10 mL
  Filled 2024-12-16: qty 10

## 2024-12-16 MED ORDER — GADOBUTROL 1 MMOL/ML IV SOLN
6.0000 mL | Freq: Once | INTRAVENOUS | Status: AC | PRN
  Administered 2024-12-16: 6 mL via INTRAVENOUS

## 2024-12-16 MED ORDER — VANCOMYCIN HCL IN DEXTROSE 1-5 GM/200ML-% IV SOLN
1000.0000 mg | INTRAVENOUS | Status: DC
  Administered 2024-12-16 – 2024-12-19 (×4): 1000 mg via INTRAVENOUS
  Filled 2024-12-16 (×4): qty 200

## 2024-12-16 MED ORDER — AMOXICILLIN-POT CLAVULANATE 875-125 MG PO TABS
1.0000 | ORAL_TABLET | Freq: Two times a day (BID) | ORAL | Status: DC
  Administered 2024-12-16: 1 via ORAL
  Filled 2024-12-16: qty 1

## 2024-12-16 MED ORDER — ACETAMINOPHEN 325 MG PO TABS
650.0000 mg | ORAL_TABLET | Freq: Four times a day (QID) | ORAL | Status: DC | PRN
  Administered 2024-12-16 – 2024-12-17 (×2): 650 mg via ORAL
  Filled 2024-12-16 (×2): qty 2

## 2024-12-16 MED ORDER — MAGNESIUM SULFATE 2 GM/50ML IV SOLN
2.0000 g | Freq: Once | INTRAVENOUS | Status: AC
  Administered 2024-12-16: 2 g via INTRAVENOUS
  Filled 2024-12-16: qty 50

## 2024-12-16 MED ORDER — GABAPENTIN 100 MG PO CAPS: 300.0000 mg | ORAL_CAPSULE | Freq: Three times a day (TID) | ORAL | Status: DC

## 2024-12-16 MED ORDER — GABAPENTIN 300 MG PO CAPS
300.0000 mg | ORAL_CAPSULE | Freq: Three times a day (TID) | ORAL | Status: DC
  Administered 2024-12-16 – 2024-12-20 (×12): 300 mg via ORAL
  Filled 2024-12-16 (×12): qty 1

## 2024-12-16 MED ORDER — INSULIN GLARGINE 100 UNIT/ML ~~LOC~~ SOLN: 20.0000 [IU] | Freq: Every day | SUBCUTANEOUS | Status: AC

## 2024-12-16 MED ORDER — FONDAPARINUX SODIUM 2.5 MG/0.5ML ~~LOC~~ SOLN
2.5000 mg | SUBCUTANEOUS | Status: DC
  Administered 2024-12-16 – 2024-12-17 (×2): 2.5 mg via SUBCUTANEOUS
  Filled 2024-12-16 (×5): qty 0.5

## 2024-12-16 MED ORDER — POTASSIUM CHLORIDE CRYS ER 20 MEQ PO TBCR
40.0000 meq | EXTENDED_RELEASE_TABLET | Freq: Once | ORAL | Status: AC
  Administered 2024-12-16: 40 meq via ORAL
  Filled 2024-12-16: qty 2

## 2024-12-16 MED ORDER — LACTATED RINGERS IV BOLUS
500.0000 mL | Freq: Once | INTRAVENOUS | Status: AC
  Administered 2024-12-16: 500 mL via INTRAVENOUS

## 2024-12-16 MED ORDER — INSULIN GLARGINE 100 UNIT/ML ~~LOC~~ SOLN
5.0000 [IU] | Freq: Every day | SUBCUTANEOUS | Status: DC
  Administered 2024-12-16 – 2024-12-17 (×2): 5 [IU] via SUBCUTANEOUS
  Filled 2024-12-16 (×3): qty 0.05

## 2024-12-16 MED ORDER — AMOXICILLIN-POT CLAVULANATE 875-125 MG PO TABS
1.0000 | ORAL_TABLET | Freq: Two times a day (BID) | ORAL | 0 refills | Status: DC
  Filled 2024-12-16: qty 13, 7d supply, fill #0

## 2024-12-16 MED ORDER — SODIUM CHLORIDE 0.9 % IV SOLN
2.0000 g | Freq: Two times a day (BID) | INTRAVENOUS | Status: DC
  Administered 2024-12-16 – 2024-12-20 (×8): 2 g via INTRAVENOUS
  Filled 2024-12-16 (×8): qty 12.5

## 2024-12-16 MED ORDER — HYDROMORPHONE HCL 1 MG/ML IJ SOLN
1.0000 mg | Freq: Once | INTRAMUSCULAR | Status: AC
  Administered 2024-12-16: 1 mg via INTRAVENOUS
  Filled 2024-12-16: qty 1

## 2024-12-16 NOTE — Progress Notes (Addendum)
 "  HD#3 SUBJECTIVE:  Patient Summary: Jose [name] is a 64 y.o. male with a history of pancreatic cancer (adenocarcinoma) currently being treated with chemotherapy (gemcitabine and paclitaxel), type 2 diabetes, hypertension, COPD, HLD, CAD, diabetic nephropathy with nodular glomerulosclerosis, CKD stage IIIb, and gout who presented with nausea, vomiting, and abdominal pain and is admitted for neutropenic fever and sepsis, now improving s/p IV Cefepime , though with continued thrombocytopenia  Overnight Events:  None  Interim History:  Patient states his abdominal pain and nausea are improving. Tolerating CLD diet well and feeling ready to advance diet today. No issues with BM or urinating. His greatest complaint is his continued toe pain. He feels like he previously received an injection in the toe in the past, but cannot recall what kind of injection (steroids vs. Toradol  etc), and unclear exactly when he received this.   OBJECTIVE:  Vital Signs: Vitals:   12/15/24 2320 12/16/24 0314 12/16/24 0350 12/16/24 0814  BP:  127/63  (!) 144/64  Pulse:  67  97  Resp: 20 20  20   Temp:  98.7 F (37.1 C)  98.9 F (37.2 C)  TempSrc: Oral Oral  Oral  SpO2: 93% 96%  100%  Weight:   61.5 kg   Height:       Supplemental O2: Room Air SpO2: 100 %  Filed Weights   12/14/24 0440 12/15/24 0426 12/16/24 0350  Weight: 62.6 kg 61.3 kg 61.5 kg     Intake/Output Summary (Last 24 hours) at 12/16/2024 0919 Last data filed at 12/16/2024 0827 Gross per 24 hour  Intake 1110 ml  Output 300 ml  Net 810 ml   Net IO Since Admission: 1,418.67 mL [12/16/24 0919]  Physical Exam:  Constitutional: nontoxic-appearing male lying in hospital bed, in no acute distress HEENT: normocephalic atraumatic, mucous membranes moist Cardiovascular: regular rate and rhythm Pulmonary/Chest: normal work of breathing on room air, lungs clear to auscultation bilaterally Abdominal: soft, non-tender to light palpation,  non-distended  MSK: normal bulk and tone. Right BKA without wounds on RLE. Left Great toe with wound dressing in place and no drainage  Neurological: alert & oriented x 3 Skin: warm and dry Psych: mood calm, behavior normal, thought content normal, judgement normal    Patient Lines/Drains/Airways Status     Active Line/Drains/Airways     Name Placement date Placement time Site Days   Implanted Port 10/10/24 Right Chest Single Power 10/10/24  1000  Chest  67   Negative Pressure Wound Therapy Leg Right 07/01/23  0931  --  534   Wound 12/13/24 2000  Toe (Comment  which one) Left 12/13/24  2000  Toe (Comment  which one)  3            Pertinent labs and imaging:      Latest Ref Rng & Units 12/16/2024    6:10 AM 12/15/2024    5:02 AM 12/14/2024    1:00 AM  CBC  WBC 4.0 - 10.5 K/uL 2.9  1.9  1.3   Hemoglobin 13.0 - 17.0 g/dL 7.8  7.9  7.8   Hematocrit 39.0 - 52.0 % 22.1  22.1  22.0   Platelets 150 - 400 K/uL 74  91  99        Latest Ref Rng & Units 12/16/2024    6:10 AM 12/15/2024    5:02 AM 12/14/2024    1:00 AM  CMP  Glucose 70 - 99 mg/dL 835  864  862   BUN 8 -  23 mg/dL 21  21  20    Creatinine 0.61 - 1.24 mg/dL 8.58  8.64  8.77   Sodium 135 - 145 mmol/L 138  139  134   Potassium 3.5 - 5.1 mmol/L 3.5  3.0  3.5   Chloride 98 - 111 mmol/L 109  109  103   CO2 22 - 32 mmol/L 19  19  20    Calcium  8.9 - 10.3 mg/dL 8.9  8.8  8.6   Total Protein 6.5 - 8.1 g/dL  5.5  5.7   Total Bilirubin 0.0 - 1.2 mg/dL  0.5  0.5   Alkaline Phos 38 - 126 U/L  225  197   AST 15 - 41 U/L  28  28   ALT 0 - 44 U/L  31  32     No results found.  ASSESSMENT/PLAN:  Assessment: Principal Problem:   Neutropenic fever Active Problems:   Hypertension associated with diabetes (HCC)   Dyslipidemia associated with type 2 diabetes mellitus (HCC)   Chronic kidney disease, stage 3b (HCC)   COPD (chronic obstructive pulmonary disease) (HCC)   CAD (coronary artery disease)   Diabetes (HCC)   S/P BKA  (below knee amputation) unilateral, right (HCC) - on 05-30-2023   Sepsis (HCC)   Acute encephalopathy   Plan: #Neutropenic fever, absolute neutrophil count 100 #Sepsis #Encephalopathy, resolved  #Pancreatic adenocarcinoma on chemotherapy No source of infection currently.  CT of the abdomen and pelvis without signs of perforation, abscess, or other source.  Biliary stent appears patent.  No significant respiratory symptoms, UA without signs of infection, flu/COVID/RSV negative.  Case discussed with oncology who are considering granulocyte colony stimulating factor. Follows with outpatient Oncologist Dr. Tina at [facility]. Pancreatic biopsy 07/12/2024 showed adenocarcinoma. Immunoperoxidase studies performed on the core biopsy show an increased Ki67and loss of p53 in the neoplastic glands.  Currently on cycle 3 of chemotherapy with last infusion on 12/09/2024, gemcitabine and paclitaxel. Continued improvement of leukopenia 1.1 --> 1.3 --> 1.9 --> 2.9 and Neurophils 0.1 --> 0.4 --> 0.8 --> 1.4. He has been afebrile since 1/20 fever, was off of Tylenol . So far no source for infectious cause. Confirmed with Oncology that they are in agreement with 72 hour course of antibiotics with no clear source and likely secondary to chemotherapy, which completed PM 1/22. C-diff panel negative. Will follow stool cultures. Thrombocytopenia on labs, by his prior baseline does not appear to have a history of thrombocytopenia. 99 --> 91 --> 74. Likely due to chemotherapy. Immature platelet fraction of 1.7. Will discontinue Lovenox , and switch to fondaparinox, currently on SCDs.  - Oncology consulted, appreciate assistance  - Consult Podiatry as below for left Great toe  Culture Data:             - 1/20 flu/COVID/RSV: negative             - 1/20 Blood Cultures: NGTD 3 days, final pending              - 1/20: GI Pathogen panel: Negative             - 1/20: C. diff: Negative  - 1/21: Left foot X-ray: No osteomyelitis   Antibiotic Data:             - s/p IV Metronidazole  500 mg once (i-d1/20)             - s/p IV Vanc q24 hrs (i1/20 - d1/21)  - s/p IV Cefepime  2 g q12 hrs (  i1/20 - d1/22) for full 72 hours - s/p IVF LR 1L bolus x4 - Admit to progressive - Pepcid  - Zofran  - Oxycodone  5 mg q12 prn severe pain  - daily CBC w/ diff   #Thrombocytopenia Thrombocytopenia on labs, by his prior baseline does not appear to have a history of thrombocytopenia. 99 --> 91 --> 74. Likely due to chemotherapy, though has been on chemo since September 2025. Immature platelet fraction of 1.7. He has been getting Lovenox  through admission, will discontinue Lovenox , and switch to fondaparinox, currently on SCDs.  - daily CBC with diff, trend platelets  - discontinue Lovenox  - Pharmacy consult for anticoagulation change for likely fondaparinox, SCDs in interim - f/u Heparin  induced platelet Ab lab   #Left Great toenail unroofing Patient has unroofed left Great toenail. Picture in media tab. Does not appear with erythema or drainage. He reports the toenail fell off recently. Seen 11/09/2024 in the [facility] ED for left foot pain and swelling where X-rays obtained unremarkable for osteomyelitis or fracture. He was given Colchicine  for concern of possible gout flare. No mention of whether toenail was present or not at that time. Given neutropenia and so far unclear infectious work up, repeated left foot X-ray which did not show evidence of osteomyelitis. Wound care was consulted, appreciate assistance. Considering discharge with 7 days of Augmentin  875 mg BID to cover for cellulitis and make sure he follows up with Podiatry outpatient. For now, patient with continued significant pain in toe which is now is greatest complaint, discussed with Podiatry and Ortho. Ortho recommended MRI foot w/wo contrast to r/o osteomyelitis, and agree with Augmentin  875 mg BID.  - Left Great toe wound care recs: cleanse wound with normal saline pat dry with  gauze and apply 2 layers of Xeroform yellow gauze over wound, cover with 4x4 and rolled Kerlix gauze, secure in place with tape.  - Consulted Ortho, appreciate assistance - f/u MRI left foot w/wo contrast  - Augmentin  875 mg BID  - f/u outpatient with Podiatry, and Ortho if MRI positive  - Tylenol  650 mg q6 hrs prn  - Oxycodone  5 mg q12 prn severe pain    #Type 2 diabetes #Chronic metabolic acidosis #CKD stage IIIb #Diabetic nephropathy with glomerulosclerosis Home diabetes regimen includes insulin  glargine 25 units daily. 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 Sodium bicarb 650 mg twice daily is listed on his home medication list and on admission bicarb low at 16, though improved to 19-20. Will continue to monitor.  - SSI - Hold home insulin  glargine 25 units daily while NPO and add as clinically indicated - I/Os - Daily BMP - Continue home Gabapentin  100 mg TID  - Hold home Sodium bicarb 650 mg twice daily   #Hypertension #Hyperlipidemia #CAD Blood pressure initially elevated here but now normotensive.  We will hold antihypertensives for now.  No reports of chest pain today. Will plan to resume home medications Aspirin  81 mg daily, Plavix  75 mg daily, and Atorvastatin  80 mg daily. Originally held home amlodipine  and carvedilol  originally iso possible shock. Though BP has been normotensive to hypertensive so resumed Carvedilol  to start with, will resume amlodipine  if continued elevated BP.   - Continue home Aspirin  81 mg - Continue home Plavix  75 mg daily  - Continue home Atorvastatin  80 mg daily - Continue home Carvedilol  6.25 mg twice daily - Hold  home Amlodipine  10 mg daily - Sublingual nitroglycerin    #COPD No significant respiratory symptoms here and he does appear to be on  LABA/ICS at home.  We will continue this while admitted and if needed will add DuoNebs. - Continue home LABA/ICS - Duonebs prn    #Protein Calorie Deficit #Nutritional deficiencies  Patient takes home Ensure, Folic acid  1 mg daily, Vitamin B12 500 mcg daily, Mirtazapine  15 mg daily, and Thiamine  100 mg daily. Resumed these and allowing him to drink as tolerated starting with CLD.  - Continue home Folic acid  1 mg daily - Continue home Vitamin B12 500 mcg daily - Continue home Mirtazapine  15 mg daily - Continue home Thiamine  100 mg daily  #GERD Takes home Omeprazole 40 mg daily. Giving Protonix  40 mg BID while admitted.  - Protonix  40 mg BID   Best Practice: Diet: Cardiac diet VTE: fondaparinux  (ARIXTRA ) injection 2.5 mg Start: 12/16/24 1000 Code: Full  Disposition planning: DISPO: Anticipated discharge 1-2 to Home pending improvement of leukopenia and thrombocytopenia.  Signature:  Doyal Miyamoto, MD [facility]  9:19 AM, 12/16/2024  On Call pager [phone]  "

## 2024-12-16 NOTE — Progress Notes (Signed)
 Pharmacy Antibiotic Note  Jose [name] is a 64 y.o. male admitted on 12/13/2024 with osteomyelitis.  Pharmacy has been consulted for Vancomyicn dosing.  Plan: Vancomycin  1000 mg IV every 24 hours.  Goal AUC/MIC 400-600. Estimated AUC 446.  Height: 5' 11 (180.3 cm) Weight: 61.5 kg (135 lb 9.3 oz) IBW/kg (Calculated) : 75.3  Temp (24hrs), Avg:98.6 F (37 C), Min:98 F (36.7 C), Max:98.9 F (37.2 C)  Recent Labs  Lab 12/13/24 0723 12/13/24 0741 12/13/24 1400 12/13/24 1458 12/14/24 0100 12/15/24 0502 12/16/24 0610  WBC 1.1*  --   --   --  1.3* 1.9* 2.9*  CREATININE 1.48* 1.50*  --  1.33* 1.22 1.35* 1.41*  LATICACIDVEN  --  0.8 1.7  --   --   --   --     Estimated Creatinine Clearance: 45.4 mL/min (A) (by C-G formula based on SCr of 1.41 mg/dL (H)).    Allergies[1]  Antimicrobials this admission: Vancomycin  1/20>>1/21, 1/23 >>  Cefepime  1/20 >>1/22, Restart 1/23 >>  [place] 1/20 once  Augmentin  1/23>>1/23  Thank you for allowing pharmacy to be a part of this patients care.  Jose [name] Jose [name] 12/16/2024 5:11 PM     [1]  Allergies Allergen Reactions   Lisinopril -Hydrochlorothiazide  Swelling and Other (See Comments)    Causes swelling of lips   Simvastatin Other (See Comments)    ALT (SGPT) level raised, Aspartate aminotransferase serum level raised

## 2024-12-16 NOTE — Plan of Care (Signed)
   Problem: Clinical Measurements: Goal: Will remain free from infection Outcome: Progressing   Problem: Activity: Goal: Risk for activity intolerance will decrease Outcome: Progressing   Problem: Coping: Goal: Level of anxiety will decrease Outcome: Progressing   Problem: Safety: Goal: Ability to remain free from injury will improve Outcome: Progressing   Problem: Skin Integrity: Goal: Risk for impaired skin integrity will decrease Outcome: Progressing

## 2024-12-16 NOTE — Discharge Summary (Signed)
 "  Name: Jose [name] MRN: [id number] DOB: 02-11-1961 64 y.o. PCP: [facility]  Date of Admission: 12/13/2024  6:47 AM Date of Discharge: 12/20/2024 Attending Physician: Dr. Reyes Fenton  Discharge Diagnosis: 1. Principal Problem:   Neutropenic fever Active Problems:   Hypertension associated with diabetes (HCC)   Dyslipidemia associated with type 2 diabetes mellitus (HCC)   Chronic kidney disease, stage 3b (HCC)   COPD (chronic obstructive pulmonary disease) (HCC)   CAD (coronary artery disease)   Diabetes (HCC)   S/P BKA (below knee amputation) unilateral, right (HCC) - on 05-30-2023   Sepsis (HCC)   Acute encephalopathy   Abdominal pain   Osteomyelitis of great toe of left foot (HCC)   Leukocytosis   Discharge Medications: Allergies as of 12/20/2024       Reactions   Lisinopril -hydrochlorothiazide  Swelling, Other (See Comments)   Causes swelling of lips   Simvastatin Other (See Comments)   ALT (SGPT) level raised, Aspartate aminotransferase serum level raised        Medication List     PAUSE taking these medications    colchicine  0.6 MG tablet Wait to take this until your doctor or other care provider tells you to start again. Take 1 tablet (0.6 mg total) by mouth daily.       TAKE these medications    acetaminophen  500 MG tablet Commonly known as: TYLENOL  Take 1,000 mg by mouth in the morning, at noon, and at bedtime.   albuterol  108 (90 Base) MCG/ACT inhaler Commonly known as: VENTOLIN  HFA Inhale 2 puffs into the lungs every 6 (six) hours as needed for wheezing or shortness of breath.   amLODipine  10 MG tablet Commonly known as: NORVASC  Take 1 tablet (10 mg total) by mouth daily.   aspirin  EC 81 MG tablet Take 81 mg by mouth daily. Swallow whole.   atorvastatin  80 MG tablet Commonly known as: LIPITOR  Take 1 tablet (80 mg total) by mouth daily. What changed: when to take this   carvedilol  6.25 MG tablet Commonly known as:  COREG  Take 1 tablet (6.25 mg total) by mouth 2 (two) times daily with a meal.   ciprofloxacin  500 MG tablet Commonly known as: Cipro  Take 1 tablet (500 mg total) by mouth 2 (two) times daily.   clopidogrel  75 MG tablet Commonly known as: PLAVIX  Take 1 tablet (75 mg total) by mouth daily with breakfast.   cyanocobalamin  500 MCG tablet Commonly known as: VITAMIN B12 Take 500 mcg by mouth daily.   doxycycline  100 MG tablet Commonly known as: VIBRA -TABS Take 1 tablet (100 mg total) by mouth 2 (two) times daily.   ferrous sulfate  325 (65 FE) MG EC tablet Take 325 mg by mouth every Monday, Wednesday, and Friday.   fluticasone -salmeterol 500-50 MCG/ACT Aepb Commonly known as: ADVAIR Inhale 1 puff into the lungs in the morning and at bedtime.   folic acid  1 MG tablet Commonly known as: FOLVITE  Take 1 tablet (1 mg total) by mouth daily.   gabapentin  300 MG capsule Commonly known as: NEURONTIN  Take 1 capsule (300 mg total) by mouth 3 (three) times daily as needed. What changed:  medication strength how much to take when to take this reasons to take this   HYDROmorphone  2 MG tablet Commonly known as: DILAUDID  Take 1 tablet (2 mg total) by mouth every 6 (six) hours as needed for up to 3 days for severe pain (pain score 7-10).   insulin  glargine 100 UNIT/ML injection Commonly known as: LANTUS   Inject 0.2 mLs (20 Units total) into the skin daily.   latanoprost  0.005 % ophthalmic solution Commonly known as: XALATAN  Place 1 drop into both eyes at bedtime.   magnesium  84 MG ( ) Tbcr SR tablet Commonly known as: MAGTAB Take 84 mg by mouth 3 (three) times daily between meals.   mirtazapine  15 MG tablet Commonly known as: REMERON  Take 1 tablet (15 mg total) by mouth at bedtime.   multivitamin with minerals Tabs tablet Take 1 tablet by mouth daily.   naloxone  4 MG/0.1ML Liqd nasal spray kit Commonly known as: NARCAN  Place 1 spray into the nose once. Instill 1 spray into  one nostril as directed for opioid overdose, call 911 immediately, administer dose, then turn person on side if no response in 2-3 minutes or person responds but relapses. Repeat using a new spray device and into the other nostril.   nitroGLYCERIN  0.4 MG SL tablet Commonly known as: NITROSTAT  Place 1 tablet (0.4 mg total) under the tongue every 5 (five) minutes as needed for chest pain.   OLANZapine 5 MG tablet Commonly known as: ZYPREXA Take 2.5 mg by mouth at bedtime.   omeprazole 40 MG capsule Commonly known as: PRILOSEC Take 40 mg by mouth daily.   polyethylene glycol 17 g packet Commonly known as: MIRALAX  / GLYCOLAX  Take 17 g by mouth 2 (two) times daily. What changed:  when to take this reasons to take this   SITagliptin 100 MG Tabs Take 100 mg by mouth daily at 12 noon.   thiamine  100 MG tablet Commonly known as: Vitamin B-1 Take 1 tablet (100 mg total) by mouth daily.         Disposition and follow-up:   Jose [name] was discharged from [facility] in Stable condition.  At the hospital follow up visit please address:  1.  Neutropenic Fever: Patient was admitted for neutropenic fever, altered mental status and sepsis. Extensive infectious work up unrevealing and felt likely due to chemotherapy. At follow up, please obtain repeat labs to ensure normalization of leukocytes and neutrophil count. He remained afebrile since AM 1/21. Scheduled to see Oncology on 1/28 at the [facility].  Thrombocytopenia: No active signs of bleeding, discontinued Lovenox  and switched to Fondaprinox. Hgb remained stable during hospitalization. Repeat CBC to monitor platelets at follow up visit. Resolved by day of discharge (150).   Left Great toe: Unroofed toenail. MRI of the left foot concerning for osteomyelitis. He was given wound care, IV Vanc and Cefepime  before transitioning to oral Doxy and Cipro  on day of discharge. Ortho recommended switching to oral antibiotics and  close follow up outpatient with them. They did not recommend immediate surgery while inpatient. Will need to follow up outpatient.  - Left Great toe wound care recs: no wrapping, creams or lotions - WBAT - patient to monitor improvement vs. worsening of pain symptoms  - Doxycycline  and Cipro  until f/u with Ortho    - He was scheduled to see Podiatry on 1/20, but unfortunately could not show up to that appointment due to being admitted in the hospital, please ensure this is rescheduled.   2.  Labs / imaging needed at time of follow-up: CBC with diff, CMP, Mag  3.  Pending labs/ test needing follow-up: None  Follow-up Appointments:  Follow-up Information     Elsa Lonni SAUNDERS, MD. Schedule an appointment as soon as possible for a visit in 1 week(s).   Specialty: Orthopedic Surgery Why: Please call Dr. Isiah office to  make an appointment for follow up regarding your left Great toe. Please ensure that you take your oral antibiotics (Ciprofloxacin  and Doxycycline  until you see him). Contact information: [address] [place] [address] [phone]                Oncology: 12/21/2024 at 3:20pm, with Oncologist Dr. Oneil Chihuahua at the [facility].   Podiatry: Was scheduled for Podiatry on 12/13/2024, but due to hospitalization missed that appointment at the [facility]. Please make sure patient has rescheduled that appointment.  Hospital Course by problem list: Jose [name] is a 64 y.o. male with a history of pancreatic cancer (adenocarcinoma) currently being treated with chemotherapy (gemcitabine and paclitaxel), type 2 diabetes, hypertension, COPD, HLD, CAD, diabetic nephropathy with nodular glomerulosclerosis, CKD stage IIIb, and gout who presented with nausea, vomiting, and abdominal pain and was admitted for neutropenic fever, encephalopathy, and sepsis, now being discharged on hospital day 7 with the following pertinent hospital course:  #Neutropenic fever, resolved   #Absolute neutrophil count 100, resolved  #Leukopenia, resolved  #Sepsis, resolved  #Encephalopathy, resolved  #Pancreatic adenocarcinoma on chemotherapy No source of infection found throughout admission.  CT of the abdomen and pelvis without signs of perforation, abscess, or other source.  Biliary stent appears patent.  No significant respiratory symptoms, UA without signs of infection, flu/COVID/RSV negative.  Case discussed with oncology who began granulocyte colony stimulating factor. Follows with outpatient Oncologist Dr. [facility]ua at [facility]. Pancreatic biopsy 07/12/2024 showed adenocarcinoma. Immunoperoxidase studies performed on the core biopsy show an increased Ki67and loss of p53 in the neoplastic glands.  Currently on cycle 3 of chemotherapy with last infusion on 12/09/2024, gemcitabine and paclitaxel. C. diff negative. GI path panel negative. Blood cultures with no growth at 5 days, final. His left Great toenail was unroofed recently, so left foot X-ray was obtained which was negative for osteomyelitis. He was managed with IVF, IV antibiotics Metronidazole , Vancomycin , and Cefepime  for 72 hours with improvement of his clinical status and neutrophile count. Discussed with Oncology who felt comfortable with 72 hour course of IV abx and discontinuing. His initial fever was lowered with Tylenol , but after 24 hours, Tylenol  was held to assess fever curve. He remained afebrile throughout the rest of admission. He was monitored with daily CBC with diff, BMP and Mag panels. Bump in white count ikely in the setting of one extra dose of GSF. This was discontinued but patient looking well clinically with no new sick symptoms with WBC improving. Leukocytes and neutrophils by day of discharge improved. He was found to be thrombocytopenic to 69, and felt to be due to chemotherapy. He did not require platelet transfusion and no active bleeds. He was switched to Fondaparinox from Lovenox , though felt unlikely  HIT. Heparin  induced platelet Ab lab was negative. He continued to endorse left Great toe pain as below, so obtained MRI left foot w/wo contrast to rule out osteomyelitis, this showed concern for osteomyelitis of the left Great toe distal phalanx. He was resumed on IV Vanc and Cefepime  on 1/23. Ortho recommended switching to oral antibiotics and close follow up outpatient with them. They did not recommend immediate surgery while inpatient. Will need to follow up outpatient. He was discharged in good condition.  - f/u outpatient with Ortho - f/u outpatient Podiatry (needs to reschedule missed appointment due to hospitalization)  - f/u outpatient Dr. Chihuahua Oncology  - repeat CBC with diff at f/u   #Thrombocytopenia, resolved Thrombocytopenia on labs, based on prior labs  visible baseline does not appear to have a history of thrombocytopenia. 122 --> 99 --> 91 --> 74 --> 69 (nadir) --> 150 by day of discharge. Likely due to chemotherapy, though has been on chemo since September 2025. Immature platelet fraction of 1.7. Since we [facility] all [facility], unclear if recent thrombocytopenia or Lovenox  exposure. He had been getting Lovenox  through admission, so discontinued and switched to fondaparinox. Heparin  induced platelet Ab lab negative. He did not have concerns for active bleeding during admission.  - repeat CBC with diff at f/u   #Left Great toenail unroofing #Left Great toe distal phalanx osteomyelitis  #History of Right AKA 2024 Patient has unroofed left Great toenail. Picture in media tab. Does not appear with erythema or drainage. He reports the toenail fell off recently. Seen 11/09/2024 in the [facility] ED for left foot pain and swelling where X-rays obtained unremarkable for osteomyelitis or fracture. He was given Colchicine  for concern of possible gout flare. No mention of whether toenail was present or not at that time.  Given neutropenia and negative infectious work up thus far, repeated left  foot X-ray which did not show evidence of osteomyelitis. Wound care was consulted, appreciate assistance. Patient had continued severe toe pain so discussed with Podiatry and Ortho. MRI left foot w/wo contrast to rule out osteomyelitis, this showed concern for osteomyelitis of the left Great toe distal phalanx. He was resumed on IV Vanc and Cefepime  on 1/23. Ortho recommended switching to oral antibiotics and close follow up outpatient with them. They did not recommend immediate surgery while inpatient. Will need to follow up outpatient.  - Left Great toe wound care recs: no wrapping, creams or lotions - WBAT - patient to monitor improvement vs. 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 Sodium bicarb 650 mg twice daily is listed on his home medication list and on admission bicarb low at 16, though improved to 19-20 so this was held during admission and monitored daily. He was resumed on Gabapentin  300 mg TID. Insulin  glargine was held during admission and he was initiated on SSI.  - Continue home Gabapentin  300 mg TID  - Hold home Sodium bicarb 650 mg twice daily   #Hypertension #Hyperlipidemia #CAD Blood pressure initially elevated here but now normotensive.  We will hold antihypertensives for now.  No reports of chest pain today. Will plan to resume home medications Aspirin  81 mg daily, Plavix  75 mg daily, and Atorvastatin  80 mg daily.  Originally held home amlodipine  and carvedilol  originally iso possible shock. Though BP has been normotensive to hypertensive so resumed Carvedilol  to start with, will resume amlodipine  if continued elevated BP.   - Continue home Aspirin  81 mg - Continue home Plavix  75 mg daily  - Continue home Atorvastatin  80 mg daily - Continue home Carvedilol  6.25 mg twice daily - Hold home Amlodipine  10 mg daily and resume as clinically appropriate - Sublingual nitroglycerin    #COPD No significant respiratory symptoms here and on home Fluticasone /salmeterol 500-50 micrograms 1  puff twice daily.  Continued this during admission and added Duonebs as needed.  - Continue home Fluticasone /salmeterol 500-50 micrograms 1 puff twice daily    #Protein Calorie Deficit #Nutritional deficiencies  Patient takes home Ensure, Folic acid  1 mg daily, Vitamin B12 500 mcg daily, Mirtazapine  15 mg daily, and Thiamine  100 mg daily. Will resume these and allow him to drink as tolerated starting with CLD.  - Resume home Folic acid  1 mg daily - Resume home Vitamin B12 500 mcg daily - Resume home Mirtazapine  15 mg daily - Resume home Thiamine  100 mg daily  #GERD Takes home Omeprazole 40 mg daily. Gave Protonix  40 mg BID while admitted.  - Protonix  40 mg BID   Subjective Patient assessed bedside laying in bed. Stated he was frustrated because he had not eaten today waiting for the orthopedic surgeon. He said he was in pain in his toe. The surgeon did come by this morning and stated they were not going to amputate. Patient stated he feels good about going home if he has his medicine. He has a [facility] nurse that comes daily for 2 hours and helps with medicines but does not help with strength. Had a BM this AM.  On re-examination, feeling ready to go home and reassured by his improving labs. Understands he will need to follow up next week with his Oncologist and to reschedule his Podiatry appointment.   Discharge Exam:   BP (!)  147/70 (BP Location: Left Arm)   Pulse 90   Temp 98.2 F (36.8 C) (Oral)   Resp 15   Ht 5' 11 (1.803 m)   Wt 61.9 kg   SpO2 96%   BMI 19.03 kg/m   Physical Exam:   Constitutional: nontoxic-appearing male lying in hospital bed, in no acute distress HEENT: normocephalic atraumatic, mucous membranes moist Cardiovascular: regular rate and rhythm Pulmonary/Chest: normal work of breathing on room air, lungs clear to auscultation bilaterally Abdominal: soft, non-tender to light palpation, non-distended  MSK: normal bulk and tone. Right BKA without wounds on RLE. Left Great toe without erythema or drainage    Neurological: alert & oriented x 3 Skin: warm and dry Psych: mood calm, behavior normal, thought content normal, judgement normal    Pertinent Labs, Studies, and Procedures:     Latest Ref Rng & Units 12/20/2024    3:55 AM 12/19/2024    4:50 AM 12/18/2024    3:54 AM  CBC  WBC 4.0 - 10.5 K/uL 15.9  20.5  18.2   Hemoglobin 13.0 - 17.0 g/dL 7.9  7.6  7.9   Hematocrit 39.0 - 52.0 % 22.3  21.4  22.6   Platelets 150 - 400 K/uL 150  122  91        Latest Ref Rng & Units 12/20/2024    3:55 AM 12/19/2024    4:50 AM 12/18/2024    3:54 AM  CMP  Glucose 70 - 99 mg/dL 811  743  882   BUN 8 - 23 mg/dL 20  18  18    Creatinine 0.61 - 1.24 mg/dL 8.44  8.38  8.47   Sodium 135 - 145 mmol/L 136  136  140   Potassium 3.5 - 5.1 mmol/L 3.7  3.4  3.5   Chloride 98 - 111 mmol/L 105  106  110   CO2 22 - 32 mmol/L 21  20  20    Calcium  8.9 - 10.3 mg/dL 9.0  8.9  9.0     DG Foot 2 Views  Left Result Date: 12/14/2024 CLINICAL DATA:  New left great toenail unroofing with wound. Pain. EXAM: LEFT FOOT - 2 VIEW COMPARISON:  11/09/2024 FINDINGS: No erosive or bony destructive changes. No abnormal bone density to suggest osteomyelitis. No acute fracture. Scattered osteoarthritis, mild for age. Small plantar calcaneal spur. Extensive arterial vascular calcifications. No soft tissue gas or radiopaque  foreign body. Lucency about the great toe nail bed consistent with clinical history. IMPRESSION: 1. No radiographic evidence of osteomyelitis. 2. Lucency about the great toe nail bed consistent with clinical history of unroofing. 3. Extensive arterial vascular calcifications. Electronically Signed   By: Andrea Gasman M.D.   On: 12/14/2024 13:32   CT HEAD WO CONTRAST ( ) Result Date: 12/13/2024 CLINICAL DATA:  History of pancreatic cancer, current Jose [name] receiving chemotherapy, presenting with neutropenic fever, nausea, vomiting and lower abdominal pain. EXAM: CT HEAD WITHOUT CONTRAST TECHNIQUE: Contiguous axial images were obtained from the base of the skull through the vertex without intravenous contrast. RADIATION DOSE REDUCTION: This exam was performed according to the departmental dose-optimization program which includes automated exposure control, adjustment of the mA and/or kV according to patient size and/or use of iterative reconstruction technique. COMPARISON:  Apr 05, 2013 FINDINGS: Brain: There is generalized cerebral atrophy with widening of the extra-axial spaces and ventricular dilatation. There are areas of decreased attenuation within the white matter tracts of the supratentorial brain, consistent with microvascular disease changes. Vascular: Marked severity bilateral cavernous carotid artery calcification is noted. Skull: Normal. Negative for fracture or focal lesion. Sinuses/Orbits: There is mild bilateral ethmoid sinus mucosal thickening. Other: None. IMPRESSION: 1. Generalized cerebral atrophy with chronic white matter small vessel ischemic changes. 2. No acute intracranial abnormality. 3. Mild bilateral ethmoid sinus disease. Electronically Signed   By: Suzen Dials M.D.   On: 12/13/2024 17:06   CT CHEST WO CONTRAST Result Date: 12/13/2024 CLINICAL DATA:  Fever. EXAM: CT CHEST WITHOUT CONTRAST TECHNIQUE: Multidetector CT imaging of the chest was performed following the standard  protocol without IV contrast. RADIATION DOSE REDUCTION: This exam was performed according to the departmental dose-optimization program which includes automated exposure control, adjustment of the mA and/or kV according to patient size and/or use of iterative reconstruction technique. COMPARISON:  Chest CT dated 06/16/2020. FINDINGS: Evaluation of this exam is limited in the absence of intravenous contrast. Cardiovascular: There is no cardiomegaly or pericardial effusion. Advanced coronary vascular calcification. Right-sided Port-A-Cath with tip in the right atrium close to the cavoatrial junction. Mild atherosclerotic calcification of the thoracic aorta. No aneurysmal dilatation. The central pulmonary arteries are grossly unremarkable. Mediastinum/Nodes: No obvious hilar or mediastinal adenopathy. The esophagus is grossly unremarkable. No mediastinal fluid collection. Lungs/Pleura: Minimal bibasilar subpleural atelectasis. No focal consolidation, pleural effusion or pneumothorax. The central airways are patent. Upper Abdomen: See report for CT abdomen pelvis. Musculoskeletal: No acute osseous pathology. IMPRESSION: 1. No acute intrathoracic pathology. 2. Advanced coronary vascular calcification. 3.  Aortic Atherosclerosis (ICD10-I70.0). Electronically Signed   By: Vanetta Chou M.D.   On: 12/13/2024 16:58   CT ABDOMEN PELVIS W CONTRAST Result Date: 12/13/2024 CLINICAL DATA:  Chest pain with diarrhea and abdominal pain. History of pancreatic cancer currently undergoing chemotherapy. EXAM: CT ABDOMEN AND PELVIS WITH CONTRAST TECHNIQUE: Multidetector CT imaging of the abdomen and pelvis was performed using the standard protocol following bolus administration of intravenous contrast. RADIATION DOSE REDUCTION: This exam was performed according to the departmental dose-optimization program which includes automated exposure control, adjustment of the mA and/or kV according to patient size and/or  use of iterative  reconstruction technique. CONTRAST:  75mL OMNIPAQUE  IOHEXOL  350 MG/ML SOLN COMPARISON:  08/14/2024 FINDINGS: Lower chest: Mild stable cardiomegaly. Minimal calcified plaque over the descending thoracic aorta. Visualized lung bases are clear. Hepatobiliary: Interval replacement of common bile duct stent which appears in adequate position. Air seen within the more proximal common bile duct as well as air within the nondependent portion of the gallbladder. Minimal central intrahepatic biliary air. Liver is otherwise unremarkable. Pancreas: Stable 3 cm slightly septated cystic lesion over the pancreatic tail. Previously seen 2.5 cm pancreatic head mass not as well defined as on the prior exam as there is very minimal ill-defined low-attenuation over the pancreatic head. Spleen: Normal. Adrenals/Urinary Tract: Adrenal glands are normal. Kidneys are normal in size without hydronephrosis. 4 mm calcification over the lower pole right kidney unchanged. Metallic density over the lower pole left kidney unchanged. Resolution of previous seen left perinephric hematoma. Ureters and bladder are normal. Stomach/Bowel: Stomach is within normal. Small bowel is normal. Appendix is normal. Air and fluid throughout the colon which is otherwise unremarkable. No free peritoneal air. Vascular/Lymphatic: Mild calcified plaque over the abdominal aorta which is normal in caliber. Few small stable portacaval lymph nodes. No other significant adenopathy. Reproductive: Prostate is unremarkable. Other: No significant free peritoneal fluid or free peritoneal air. Musculoskeletal: Significant degenerative changes of the spine and mild degenerate changes of the hips. Multiple stable Schmorl's nodes over the spine without definite metastatic disease. IMPRESSION: 1. No acute findings in the abdomen/pelvis. 2. Interval replacement of common bile duct stent which appears in adequate position with air within the biliary system as described 3. Stable 3 cm  slightly septated cystic lesion over the pancreatic tail. Previously seen 2.5 cm pancreatic head mass not as well defined as on the prior exam as there is very minimal ill-defined low-attenuation over the pancreatic head. 4. Stable 4 mm right renal stone. 5. Aortic atherosclerosis. Aortic Atherosclerosis (ICD10-I70.0). Electronically Signed   By: Toribio Agreste M.D.   On: 12/13/2024 11:05   DG Chest 1 View Result Date: 12/13/2024 CLINICAL DATA:  Weakness. EXAM: CHEST  1 VIEW COMPARISON:  11/09/2024 FINDINGS: Right IJ Port-A-Cath unchanged. Lungs are adequately inflated and otherwise clear. Cardiomediastinal silhouette and remainder of the exam is unchanged. IMPRESSION: No active disease. Electronically Signed   By: Toribio Agreste M.D.   On: 12/13/2024 07:44     Discharge Instructions: Discharge Instructions     Call MD for:  difficulty breathing, headache or visual disturbances   Complete by: As directed    Call MD for:  persistant dizziness or light-headedness   Complete by: As directed    Call MD for:  persistant nausea and vomiting   Complete by: As directed    Call MD for:  redness, tenderness, or signs of infection (pain, swelling, redness, odor or green/yellow discharge around incision site)   Complete by: As directed    Call MD for:  temperature >100.4   Complete by: As directed    Increase activity slowly   Complete by: As directed          Discharge Instructions      To Jose [name] or their caretakers,  They were admitted to [facility] on 12/13/2024 for evaluation and treatment of: fever and altered mental status      The evaluation suggested the white blood cell count and neutrophil count was very low and fevers (neutropenic fever). You were given an infectious work up  which found left Great toe infection which may have been the cause. You were treated with IV fluids, IV antibiotics, pain control, fever control, and close monitoring.  You were  discharged from the hospital on 12/20/24. I recommend the following after leaving the hospital:   Please make sure you continue to take your oral antibiotics: Doxycycline  and Ciprofloxacin  to begin taking on 1/28. You will need to make sure you take these until you see Dr. Elsa (Orthopedics) outpatient.  For your toe, Dr. Elsa did not want you to wrap your toe or put any creams or lotions on it. Please be mindful as to not bumping it onto anything. If your pain acutely worsens, please contact their office (contact info below for Dr. Elsa) ASAP. If you begin to develop fevers or worsening overall, please return to the Emergency Department.    You may continue to advance your diet as tolerated.     You are scheduled to follow up:  12/21/2024 at 3:20pm, with your Oncologist Dr. Oneil Chihuahua at the [facility]. Please make sure you attend this appointment.   Please schedule an appointment with Orthopedics, Dr. Elsa:   Follow-up Information     Elsa Lonni SAUNDERS, MD. Schedule an appointment as soon as possible for a visit in 1 week(s).   Specialty: Orthopedic Surgery Why: Please call Dr. Isiah office to make an appointment for follow up regarding your left Great toe. Please ensure that you take your oral antibiotics (Ciprofloxacin  and Doxycycline  until you see him). Contact information: [address] [place] [address] [phone]                 You were scheduled to see Podiatry on 12/13/2024, but due to hospitalization you had to miss that appointment at the [facility]. Please make sure you reschedule that appointment to recheck your feet.    For questions about your care plan, until you are able to see your primary doctor: Call [phone]. Dial 0 for the operator. Ask for the internal medicine resident on call.  Thank you for allowing us  to be part of your care.   Doyal Miyamoto, MD 12/20/2024, 11:18 AM        Signed: Doyal Miyamoto, MD 12/20/2024, 11:23 AM     "

## 2024-12-16 NOTE — Inpatient Diabetes Management (Signed)
 Inpatient Diabetes Program Recommendations  AACE/ADA: New Consensus Statement on Inpatient Glycemic Control (2015)  Target Ranges:  Prepandial:   less than 140 mg/dL      Peak postprandial:   less than 180 mg/dL (1-2 hours)      Critically ill patients:  140 - 180 mg/dL   Lab Results  Component Value Date   GLUCAP 254 (H) 12/16/2024   HGBA1C 7.8 (H) 12/13/2024    Review of Glycemic Control  Latest Reference Range & Units 12/15/24 23:16 12/16/24 03:36 12/16/24 08:14 12/16/24 11:15  Glucose-Capillary 70 - 99 mg/dL 853 (H) 846 (H) 731 (H) 254 (H)  Diabetes history: DM 2 Outpatient Diabetes medications:  Novolog  0-9 units tid with meals Lantus  25 units daily Current orders for Inpatient glycemic control:  Novolog  0-6 units q 4 hours  Inpatient Diabetes Program Recommendations:    Note patient is on insulin  at home.  Consider adding a portion of home basal insulin  and also consider changing Novolog  correciton to sensitive (0-9 units) tid with meals and HS since patient is on PO diet.    Thanks,  Randall Bullocks, RN, BC-ADM Inpatient Diabetes Coordinator Pager [phone]  (8a-5p)

## 2024-12-16 NOTE — Progress Notes (Signed)
" °  Progress Note   Date: 12/16/2024  Patient Name: Jose [name]        MRN#: [id number]  Clarification of the diagnosis of encephalopathy:    Toxic metabolic encephalopathy due to sepsis, now resolved     "

## 2024-12-16 NOTE — Discharge Instructions (Signed)
 To Mr. Jose [name] or their caretakers,  They were admitted to [facility] on 12/13/2024 for evaluation and treatment of: fever and altered mental status      The evaluation suggested the white blood cell count and neutrophil count was very low and fevers (neutropenic fever). You were given an infectious work up which found left Great toe infection which may have been the cause. You were treated with IV fluids, IV antibiotics, pain control, fever control, and close monitoring.  You were discharged from the hospital on 12/20/24. I recommend the following after leaving the hospital:   Please make sure you continue to take your oral antibiotics: Doxycycline  and Ciprofloxacin  to begin taking on 1/28. You will need to make sure you take these until you see Dr. Elsa (Orthopedics) outpatient.  For your toe, Dr. Elsa did not want you to wrap your toe or put any creams or lotions on it. Please be mindful as to not bumping it onto anything. If your pain acutely worsens, please contact their office (contact info below for Dr. Elsa) ASAP. If you begin to develop fevers or worsening overall, please return to the Emergency Department.    You may continue to advance your diet as tolerated.     You are scheduled to follow up:  12/21/2024 at 3:20pm, with your Oncologist Dr. Oneil Chihuahua at the [facility]. Please make sure you attend this appointment.   Please schedule an appointment with Orthopedics, Dr. Elsa:   Follow-up Information     Elsa Lonni SAUNDERS, MD. Schedule an appointment as soon as possible for a visit in 1 week(s).   Specialty: Orthopedic Surgery Why: Please call Dr. Isiah office to make an appointment for follow up regarding your left Great toe. Please ensure that you take your oral antibiotics (Ciprofloxacin  and Doxycycline  until you see him). Contact information: [address] [place] [address] [phone]                 You were  scheduled to see Podiatry on 12/13/2024, but due to hospitalization you had to miss that appointment at the [facility]. Please make sure you reschedule that appointment to recheck your feet.    For questions about your care plan, until you are able to see your primary doctor: Call [phone]. Dial 0 for the operator. Ask for the internal medicine resident on call.  Thank you for allowing us  to be part of your care.   Doyal Miyamoto, MD 12/20/2024, 11:18 AM

## 2024-12-17 DIAGNOSIS — E639 Nutritional deficiency, unspecified: Secondary | ICD-10-CM

## 2024-12-17 DIAGNOSIS — I2510 Atherosclerotic heart disease of native coronary artery without angina pectoris: Secondary | ICD-10-CM | POA: Diagnosis not present

## 2024-12-17 DIAGNOSIS — C259 Malignant neoplasm of pancreas, unspecified: Secondary | ICD-10-CM | POA: Diagnosis not present

## 2024-12-17 DIAGNOSIS — E1121 Type 2 diabetes mellitus with diabetic nephropathy: Secondary | ICD-10-CM | POA: Diagnosis not present

## 2024-12-17 DIAGNOSIS — Z794 Long term (current) use of insulin: Secondary | ICD-10-CM

## 2024-12-17 DIAGNOSIS — E1122 Type 2 diabetes mellitus with diabetic chronic kidney disease: Secondary | ICD-10-CM | POA: Diagnosis not present

## 2024-12-17 DIAGNOSIS — D696 Thrombocytopenia, unspecified: Secondary | ICD-10-CM | POA: Diagnosis not present

## 2024-12-17 DIAGNOSIS — E1169 Type 2 diabetes mellitus with other specified complication: Secondary | ICD-10-CM

## 2024-12-17 DIAGNOSIS — E8722 Chronic metabolic acidosis: Secondary | ICD-10-CM | POA: Diagnosis not present

## 2024-12-17 DIAGNOSIS — N1832 Chronic kidney disease, stage 3b: Secondary | ICD-10-CM | POA: Diagnosis not present

## 2024-12-17 DIAGNOSIS — I129 Hypertensive chronic kidney disease with stage 1 through stage 4 chronic kidney disease, or unspecified chronic kidney disease: Secondary | ICD-10-CM | POA: Diagnosis not present

## 2024-12-17 DIAGNOSIS — M86172 Other acute osteomyelitis, left ankle and foot: Secondary | ICD-10-CM | POA: Diagnosis not present

## 2024-12-17 DIAGNOSIS — R109 Unspecified abdominal pain: Secondary | ICD-10-CM

## 2024-12-17 DIAGNOSIS — K219 Gastro-esophageal reflux disease without esophagitis: Secondary | ICD-10-CM

## 2024-12-17 DIAGNOSIS — E785 Hyperlipidemia, unspecified: Secondary | ICD-10-CM | POA: Diagnosis not present

## 2024-12-17 DIAGNOSIS — J449 Chronic obstructive pulmonary disease, unspecified: Secondary | ICD-10-CM | POA: Diagnosis not present

## 2024-12-17 LAB — MAGNESIUM: Magnesium: 2 mg/dL (ref 1.7–2.4)

## 2024-12-17 LAB — SEDIMENTATION RATE: Sed Rate: 55 mm/h — ABNORMAL HIGH (ref 0–16)

## 2024-12-17 LAB — CBC WITH DIFFERENTIAL/PLATELET
Abs Immature Granulocytes: 0.8 10*3/uL — ABNORMAL HIGH (ref 0.00–0.07)
Basophils Absolute: 0 10*3/uL (ref 0.0–0.1)
Basophils Relative: 1 %
Eosinophils Absolute: 0.1 10*3/uL (ref 0.0–0.5)
Eosinophils Relative: 1 %
HCT: 21.4 % — ABNORMAL LOW (ref 39.0–52.0)
Hemoglobin: 7.5 g/dL — ABNORMAL LOW (ref 13.0–17.0)
Immature Granulocytes: 11 %
Lymphocytes Relative: 18 %
Lymphs Abs: 1.3 10*3/uL (ref 0.7–4.0)
MCH: 25.7 pg — ABNORMAL LOW (ref 26.0–34.0)
MCHC: 35 g/dL (ref 30.0–36.0)
MCV: 73.3 fL — ABNORMAL LOW (ref 80.0–100.0)
Monocytes Absolute: 0.9 10*3/uL (ref 0.1–1.0)
Monocytes Relative: 13 %
Neutro Abs: 4.1 10*3/uL (ref 1.7–7.7)
Neutrophils Relative %: 56 %
Platelets: 69 10*3/uL — ABNORMAL LOW (ref 150–400)
RBC: 2.92 MIL/uL — ABNORMAL LOW (ref 4.22–5.81)
RDW: 18.6 % — ABNORMAL HIGH (ref 11.5–15.5)
WBC: 7.2 10*3/uL (ref 4.0–10.5)
nRBC: 4.2 % — ABNORMAL HIGH (ref 0.0–0.2)

## 2024-12-17 LAB — GLUCOSE, CAPILLARY
Glucose-Capillary: 166 mg/dL — ABNORMAL HIGH (ref 70–99)
Glucose-Capillary: 199 mg/dL — ABNORMAL HIGH (ref 70–99)
Glucose-Capillary: 261 mg/dL — ABNORMAL HIGH (ref 70–99)
Glucose-Capillary: 158 mg/dL — ABNORMAL HIGH (ref 70–99)
Glucose-Capillary: 174 mg/dL — ABNORMAL HIGH (ref 70–99)

## 2024-12-17 LAB — BASIC METABOLIC PANEL WITH GFR
Anion gap: 10 (ref 5–15)
BUN: 18 mg/dL (ref 8–23)
CO2: 20 mmol/L — ABNORMAL LOW (ref 22–32)
Calcium: 8.9 mg/dL (ref 8.9–10.3)
Chloride: 108 mmol/L (ref 98–111)
Creatinine, Ser: 1.43 mg/dL — ABNORMAL HIGH (ref 0.61–1.24)
GFR, Estimated: 54 mL/min — ABNORMAL LOW
Glucose, Bld: 175 mg/dL — ABNORMAL HIGH (ref 70–99)
Potassium: 3.6 mmol/L (ref 3.5–5.1)
Sodium: 138 mmol/L (ref 135–145)

## 2024-12-17 LAB — MRSA NEXT GEN BY PCR, NASAL: MRSA by PCR Next Gen: NOT DETECTED

## 2024-12-17 LAB — HEPARIN INDUCED PLATELET AB (HIT ANTIBODY): Heparin Induced Plt Ab: 0.114 {OD_unit} (ref 0.000–0.400)

## 2024-12-17 LAB — C-REACTIVE PROTEIN: CRP: 8.2 mg/dL — ABNORMAL HIGH

## 2024-12-17 MED ORDER — ACETAMINOPHEN 500 MG PO TABS
1000.0000 mg | ORAL_TABLET | Freq: Three times a day (TID) | ORAL | Status: DC
  Administered 2024-12-17 – 2024-12-19 (×7): 1000 mg via ORAL
  Filled 2024-12-17 (×7): qty 2

## 2024-12-17 MED ORDER — OXYCODONE HCL 5 MG PO TABS
5.0000 mg | ORAL_TABLET | Freq: Once | ORAL | Status: AC
  Administered 2024-12-17: 5 mg via ORAL
  Filled 2024-12-17: qty 1

## 2024-12-17 MED ORDER — HYDROMORPHONE HCL 2 MG PO TABS
2.0000 mg | ORAL_TABLET | Freq: Four times a day (QID) | ORAL | Status: DC | PRN
  Administered 2024-12-17: 2 mg via ORAL
  Filled 2024-12-17: qty 1

## 2024-12-17 MED ORDER — HYDROMORPHONE HCL 2 MG PO TABS
2.0000 mg | ORAL_TABLET | ORAL | Status: DC | PRN
  Administered 2024-12-17 – 2024-12-20 (×7): 2 mg via ORAL
  Filled 2024-12-17 (×8): qty 1

## 2024-12-17 NOTE — Progress Notes (Signed)
 "  HD#4 SUBJECTIVE:  Patient Summary: Jose [name] is a 64 y.o. male with a history of pancreatic cancer (adenocarcinoma) currently being treated with chemotherapy (gemcitabine and paclitaxel), type 2 diabetes, hypertension, COPD, HLD, CAD, diabetic nephropathy with nodular glomerulosclerosis, CKD stage IIIb, and gout who presented with nausea, vomiting, and abdominal pain and was admitted for neutropenic fever and sepsis, now resolved, but with new concern for left Great toe osteomyelitis.   Overnight Events:  None  Interim History:  Patient having a significant amount of pain in his toe this morning. He did state that the Dilaudid  he received yesterday around the time of his MRI has been most helpful compared to the Oxycodone . He is urinating and pooping without issues. He is beginning to eat more, though still endorsing limited desire for a full meal. He ate his oatmeal without issue this morning. Denies abdominal pain or nausea.   OBJECTIVE:  Vital Signs: Vitals:   12/16/24 1904 12/16/24 2325 12/17/24 0428 12/17/24 0750  BP: (!) 146/81 139/77 136/64 134/72  Pulse: 81 76 98 88  Resp: 17 15 16 16   Temp: 98.3 F (36.8 C) 99.2 F (37.3 C) 99.1 F (37.3 C) 98.1 F (36.7 C)  TempSrc: Oral Oral Oral Oral  SpO2:  99% 96% 95%  Weight:   61.9 kg   Height:       Supplemental O2: Room Air SpO2: 95 %  Filed Weights   12/15/24 0426 12/16/24 0350 12/17/24 0428  Weight: 61.3 kg 61.5 kg 61.9 kg     Intake/Output Summary (Last 24 hours) at 12/17/2024 9048 Last data filed at 12/17/2024 9248 Gross per 24 hour  Intake 240 ml  Output 525 ml  Net -285 ml   Net IO Since Admission: 1,133.67 mL [12/17/24 0951]  Physical Exam:  Constitutional: nontoxic-appearing male lying in hospital bed, in no acute distress HEENT: normocephalic atraumatic, mucous membranes moist Cardiovascular: regular rate and rhythm Pulmonary/Chest: normal work of breathing on room air, lungs clear to  auscultation bilaterally Abdominal: soft, non-tender to light palpation, non-distended  MSK: normal bulk and tone. Right BKA without wounds on RLE. Left Great toe with wound dressing in place and no drainage  Neurological: alert & oriented x 3 Skin: warm and dry Psych: mood calm, behavior normal, thought content normal, judgement normal    Patient Lines/Drains/Airways Status     Active Line/Drains/Airways     Name Placement date Placement time Site Days   Implanted Port 10/10/24 Right Chest Single Power 10/10/24  1000  Chest  68   Negative Pressure Wound Therapy Leg Right 07/01/23  0931  --  535   Wound 12/13/24 2000  Toe (Comment  which one) Left 12/13/24  2000  Toe (Comment  which one)  4            Pertinent labs and imaging:      Latest Ref Rng & Units 12/17/2024    5:05 AM 12/16/2024    6:10 AM 12/15/2024    5:02 AM  CBC  WBC 4.0 - 10.5 K/uL 7.2  2.9  1.9   Hemoglobin 13.0 - 17.0 g/dL 7.5  7.8  7.9   Hematocrit 39.0 - 52.0 % 21.4  22.1  22.1   Platelets 150 - 400 K/uL 69  74  91        Latest Ref Rng & Units 12/17/2024    5:05 AM 12/16/2024    6:10 AM 12/15/2024    5:02 AM  CMP  Glucose 70 -  99 mg/dL 824  835  864   BUN 8 - 23 mg/dL 18  21  21    Creatinine 0.61 - 1.24 mg/dL 8.56  8.58  8.64   Sodium 135 - 145 mmol/L 138  138  139   Potassium 3.5 - 5.1 mmol/L 3.6  3.5  3.0   Chloride 98 - 111 mmol/L 108  109  109   CO2 22 - 32 mmol/L 20  19  19    Calcium  8.9 - 10.3 mg/dL 8.9  8.9  8.8   Total Protein 6.5 - 8.1 g/dL   5.5   Total Bilirubin 0.0 - 1.2 mg/dL   0.5   Alkaline Phos 38 - 126 U/L   225   AST 15 - 41 U/L   28   ALT 0 - 44 U/L   31     MR FOOT LEFT W WO CONTRAST Result Date: 12/16/2024 CLINICAL DATA:  Left great toenail unroofing with wound. Pain. Concern for osteomyelitis. EXAM: MRI OF THE LEFT FOREFOOT WITHOUT AND WITH CONTRAST TECHNIQUE: Multiplanar, multisequence MR imaging of the left foot was performed both before and after administration of  intravenous contrast. CONTRAST:  6mL GADAVIST  GADOBUTROL  1 MMOL/ML IV SOLN COMPARISON:  Radiographs of the left foot dated 12/14/2024. FINDINGS: Bones/Joint/Cartilage Marked marrow edema of the distal phalanx of the great toe with suspected decreased T1 hypointensity of the tuft, concerning for osteomyelitis. No convincing marrow signal abnormality identified elsewhere to suggest osteomyelitis. No acute fracture or dislocation. 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 No significant joint effusion. Ligaments Collateral ligaments of the MTP joints appear intact. Lisfranc ligament is intact. Muscles and Tendons No significant tenosynovitis.  No discrete intramuscular collection. Soft tissue Irregularity at the level of the nailbed. No loculated fluid collection. IMPRESSION: 1. Irregularity at the level of the great toe nailbed with marked marrow edema of the underlying distal phalanx of the great toe, concerning for osteomyelitis. 2. No loculated fluid collection. Electronically Signed   By: Harrietta Sherry M.D.   On: 12/16/2024 16:48    ASSESSMENT/PLAN:  Assessment: Principal Problem:   Neutropenic fever Active Problems:   Hypertension associated with diabetes (HCC)   Dyslipidemia associated with type 2 diabetes mellitus (HCC)   Chronic kidney disease, stage 3b (HCC)   COPD (chronic obstructive pulmonary disease) (HCC)   CAD (coronary artery disease)   Diabetes (HCC)   S/P BKA (below knee amputation) unilateral, right (HCC) - on 05-30-2023   Sepsis (HCC)   Acute encephalopathy  Jose [name] is a 64 y.o. male with a history of pancreatic cancer (adenocarcinoma) currently being treated with chemotherapy (gemcitabine and paclitaxel), type 2 diabetes, hypertension, COPD, HLD, CAD, diabetic nephropathy with nodular glomerulosclerosis, CKD stage IIIb, and gout who presented with nausea, vomiting, and abdominal pain and was admitted for neutropenic fever and sepsis, now resolved, but  with new concern for left Great toe osteomyelitis.   Plan: #Left Great toenail unroofing #Left Great toe distal phalanx osteomyelitis  Patient has unroofed left Great toenail. Picture in media tab. Does not appear with erythema or drainage. He reports the toenail fell off recently. Seen 11/09/2024 in the [facility] ED for left foot pain and swelling where X-rays obtained unremarkable for osteomyelitis or fracture. Given neutropenia nonrevealing infectious work up, repeated left foot X-ray which did not show evidence of osteomyelitis. Though with continued severe pain, obtained left foot MRI on 1/23 which was concerning for left Great toe distal phalanx osteomyelitis. IV Vanc and Cefepime   resumed. Ortho was consulted and will see patient this weekend to determine possible surgical intervention.  Imaging Data:  - 1/21 left foot X-ray: No osteomyelitis   - 1/23 left foot MRI: left Great toe distal phalanx concerning for osteomyelitis Antibiotic Data:             - s/p IV Vanc q24 hrs (i1/20 - d1/21)   - resumed after MRI results: IV Vanc q24 hrs (i1/23 - ) - s/p IV Cefepime  2 g q12 hrs (i1/20 - d1/22)  - resumed after MRI results: IV Cefepime  q12 hrs (i1/23 - )  - Ortho consulted, appreciate assistance, awaiting final recs  - Left Great toe wound care recs: cleanse wound with normal saline pat dry with gauze and apply 2 layers of Xeroform yellow gauze over wound, cover with 4x4 and rolled Kerlix gauze, secure in place with tape.  - Tylenol  1000 mg TID - Oral Dilaudid  2 mg q6 hrs prn pn  - ensure patient reschedules outpatient Podiatry appointment missed due to admission   #Thrombocytopenia Thrombocytopenia on labs, by his prior baseline does not appear to have a history of thrombocytopenia. 99 --> 91 --> 74 --> 69. Likely due to chemotherapy, though has been on chemo since September 2025. Immature platelet fraction of 1.7. Suspect he has not reached his nadir yet, and unlikely HIT but he was receiving  Lovenox  through admission, 4Ts score intermediate, so discontinued Lovenox  and switched to Fondaparinox on 1/23.  - daily CBC with diff, trend platelets  - continue Fondaparinox per Pharmacy  - f/u Heparin  induced platelet Ab lab   #Neutropenic fever, resolved  #Absolute neutrophil count 100, resolved  #Leukopenia, resolved  #Sepsis, resolved  #Encephalopathy, resolved  #Pancreatic adenocarcinoma on chemotherapy Possible source of infection could be left Great toe which MRI was concerning for osteomyelitis. Continued improvement of leukopenia 1.1 --> 1.3 --> 1.9 --> 2.9 --> 7.2 and Neurophils 0.1 --> 0.4 --> 0.8 --> 1.4. --> 4.1. He has been afebrile since 1/20 fever. He was resumed on IV Vancomycin  and IV Cefepime  on 1/23 as above after MRI left foot findings concerning for osteomyelitis. Ortho was consulted and awaiting final recs as above.  - Oncology consulted, appreciate assistance  - Ortho consulted, appreciate assistance, awaiting final recs  Culture Data:             - 1/20 flu/COVID/RSV: negative             - 1/20 Blood Cultures: NGTD 3 days, final pending              - 1/20: GI Pathogen panel: Negative             - 1/20: C. diff: Negative Imaging Data: - 1/21: Left foot X-ray: No osteomyelitis  - 1/23 left foot MRI: left Great toe distal phalanx concerning for osteomyelitis Antibiotic Data:             - s/p IV Metronidazole  500 mg once (i-d1/20)             - s/p IV Vanc q24 hrs (i1/20 - d1/21)   - resumed after MRI results: IV Vanc q24 hrs (i1/23 - ) - s/p IV Cefepime  2 g q12 hrs (i1/20 - d1/22)  - resumed after MRI results: IV Cefepime  q12 hrs (i1/23 - )  - Pepcid  - Zofran  - Tylenol  1000 mg TID - Oral Dilaudid  2 mg q6 hrs prn pn  - daily CBC w/ diff   #Type 2 diabetes #Chronic  metabolic acidosis #CKD stage IIIb #Diabetic nephropathy with glomerulosclerosis Home diabetes regimen includes insulin  glargine 20 units daily. 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 Sodium bicarb 650 mg twice daily is listed on his home medication list and on admission bicarb low at 16, though improved to 19-20. Will continue to monitor.  - Hold home insulin  glargine 20 units daily and add as clinically indicated - Insulin  glargine 5 units nightly  - SSI - I/Os - Daily BMP - Continue home Gabapentin  300 mg TID  - Hold home Sodium bicarb 650 mg twice daily, anticipate this will not be continued on discharge    #Hypertension #Hyperlipidemia #CAD Blood pressure initially elevated here but now normotensive.  We will hold antihypertensives for now.  No reports of chest pain today. Will plan to resume home medications Aspirin  81 mg daily, Plavix  75 mg daily, and Atorvastatin  80 mg daily. Originally held home amlodipine  and carvedilol  originally iso possible shock. Though BP has been normotensive to hypertensive so resumed Carvedilol  to start with, will resume amlodipine  if continued elevated BP.   - Continue home Aspirin  81 mg - Continue home Plavix  75 mg daily  - Continue home Atorvastatin  80 mg daily - Continue home Carvedilol  6.25 mg twice daily - Hold home Amlodipine  10 mg daily - Sublingual nitroglycerin    #COPD No significant respiratory symptoms here and he does appear to be on LABA/ICS at home.  We will continue this while admitted and if needed will add DuoNebs. - Continue home LABA/ICS - Duonebs prn    #Protein Calorie Deficit #Nutritional deficiencies  Patient takes home Ensure, Folic acid  1 mg daily, Vitamin B12 500 mcg daily, Mirtazapine  15 mg daily, and Thiamine  100 mg daily. Resumed these and allowing him to drink as tolerated starting with CLD.  - Continue home Folic acid  1 mg daily - Continue home Vitamin B12 500 mcg daily - Continue home Mirtazapine  15 mg  daily - Continue home Thiamine  100 mg daily - Continue Ensure shakes   #GERD Takes home Omeprazole 40 mg daily. Giving Protonix  40 mg BID while admitted.  - Protonix  40 mg BID   Best Practice: Diet: Cardiac diet VTE: fondaparinux  (ARIXTRA ) injection 2.5 mg Start: 12/16/24 1000 Code: Full  Disposition planning: DISPO: Anticipated discharge 3-4 days to pending pending IV antibiotics and Orthopedics recommendations for left Great toe osteomyelitis.  Signature:  Doyal Miyamoto, MD [facility]  9:51 AM, 12/17/2024  On Call pager [phone]  "

## 2024-12-17 NOTE — Consult Note (Signed)
 " ORTHOPAEDIC CONSULTATION  REQUESTING PHYSICIAN: Francesco Elsie NOVAK, MD  Chief Complaint: Left great toe osteomyelitis  HPI: Jose [name] is a 64 y.o. male with a  history of pancreatic cancer (adenocarcinoma) currently being treated with chemotherapy (gemcitabine and paclitaxel), type 2 diabetes, hypertension, COPD, HLD, CAD, diabetic nephropathy with nodular glomerulosclerosis, CKD stage IIIb, and gout who presented with nausea, vomiting, and abdominal pain and was admitted for neutropenic fever and sepsis, now resolved, but with new concern for left Great toe osteomyelitis.   Complaining of pain in left foot and great toe.  Also has history of right AKA in 2024  Past Medical History:  Diagnosis Date   Alcohol  abuse    CAD (coronary artery disease)    a. Reported MI in 2012 s/p 2 stents;  b. 08/2012 Cath: LM 20, LAD 20 diff ISR, jailed septal - 99%, LCX 30ost, RI large, min irregs, RCA 30p, 20-30 ISR-->Med Rx; c. 2017 Pt reports Neg [facility].   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   Gangrene of right foot (HCC) 04/24/2023   Hyperlipidemia    Hypertension    Hypertensive urgency 02/11/2021   Metabolic acidosis 05/11/2023   Noncompliance    Pancreatic cancer (HCC)    Peripheral arterial disease    Past Surgical History:  Procedure Laterality Date   ABDOMINAL AORTOGRAM W/LOWER EXTREMITY N/A 04/27/2023   Procedure: ABDOMINAL AORTOGRAM W/LOWER EXTREMITY;  Surgeon: Sheree Penne Bruckner, MD;  Location: [facility] INVASIVE CV LAB;  Service: Cardiovascular;  Laterality: N/A;   AMPUTATION Right 05/30/2023   Procedure: AMPUTATION BELOW KNEE;  Surgeon: Sheree Penne Bruckner, MD;  Location: [facility] OR;  Service: Vascular;  Laterality: Right;   AMPUTATION Right 07/01/2023    Procedure: RIGHT ABOVE KNEE AMPUTATION;  Surgeon: Magda Debby SAILOR, MD;  Location: [facility] OR;  Service: Vascular;  Laterality: Right;   APPLICATION OF WOUND VAC Right 07/01/2023   Procedure: APPLICATION OF WOUND VAC;  Surgeon: Magda Debby SAILOR, MD;  Location: [facility] OR;  Service: Vascular;  Laterality: Right;   CARDIAC CATHETERIZATION     CORONARY STENT PLACEMENT     FRACTURE SURGERY Right    hardware from right leg fracture   LEFT HEART CATH AND CORONARY ANGIOGRAPHY N/A 08/22/2020   Procedure: LEFT HEART CATH AND CORONARY ANGIOGRAPHY;  Surgeon: Claudene Victory ORN, MD;  Location: [facility] INVASIVE CV LAB;  Service: Cardiovascular;  Laterality: N/A;   LEFT HEART CATH AND CORONARY ANGIOGRAPHY N/A 01/28/2022   Procedure: LEFT HEART CATH AND CORONARY ANGIOGRAPHY;  Surgeon: Burnard Debby LABOR, MD;  Location: [facility] INVASIVE CV LAB;  Service: Cardiovascular;  Laterality: N/A;   LEFT HEART CATHETERIZATION WITH CORONARY ANGIOGRAM Bilateral 08/25/2012   Procedure: LEFT HEART CATHETERIZATION WITH CORONARY ANGIOGRAM;  Surgeon: Bruckner JONETTA Cash, MD;  Location: [facility] CATH LAB;  Service: Cardiovascular;  Laterality: Bilateral;   PERIPHERAL INTRAVASCULAR LITHOTRIPSY  04/27/2023   Procedure: PERIPHERAL INTRAVASCULAR LITHOTRIPSY;  Surgeon: Sheree Penne Bruckner, MD;  Location: [facility] INVASIVE CV LAB;  Service: Cardiovascular;;   PERIPHERAL VASCULAR BALLOON ANGIOPLASTY  04/27/2023   Procedure: PERIPHERAL VASCULAR BALLOON ANGIOPLASTY;  Surgeon: Sheree Penne Bruckner, MD;  Location: [facility] INVASIVE CV LAB;  Service: Cardiovascular;;   TRANSMETATARSAL AMPUTATION Right 05/12/2023   Procedure: TRANSMETATARSAL AMPUTATION;  Surgeon: Eliza Bruckner RAMAN, MD;  Location:  [facility] OR;  Service: Vascular;  Laterality: Right;   Social History   Socioeconomic History   Marital status: Married    Spouse name: Not on file   Number of children: Not on file   Years of education: Not on file   Highest education level: Not on file  Occupational History    Not on file  Tobacco Use   Smoking status: Former    Current packs/day: 0.00    Types: Cigarettes    Quit date: 11/25/2007    Years since quitting: 17.0   Smokeless tobacco: Never  Vaping Use   Vaping status: Never Used  Substance and Sexual Activity   Alcohol  use: Not Currently    Comment: Used to drink heavily - says currently 2 beers/day.   Drug use: Yes    Types: Marijuana    Comment: reports not using cocaine   Sexual activity: Yes    Birth control/protection: None  Other Topics Concern   Not on file  Social History Narrative   Lives in [place] by himself.  Does not work or routinely exercise.   Social Drivers of Health   Tobacco Use: Medium Risk (12/13/2024)   Patient History    Smoking Tobacco Use: Former    Smokeless Tobacco Use: Never    Passive Exposure: Not on Actuary Strain: Not on file  Food Insecurity: No Food Insecurity (12/13/2024)   Epic    Worried About Programme Researcher, Broadcasting/film/video in the Last Year: Never true    Ran Out of Food in the Last Year: Never true  Transportation Needs: No Transportation Needs (12/13/2024)   Epic    Lack of Transportation (Medical): No    Lack of Transportation (Non-Medical): No  Physical Activity: Not on file  Stress: Not on file  Social Connections: Unknown (12/13/2024)   Social Connection and Isolation Panel    Frequency of Communication with Friends and Family: More than three times a week    Frequency of Social Gatherings with Friends and Family: Never    Attends Religious Services: Never    Database Administrator or Organizations: No    Attends Banker Meetings: Never    Marital Status: Patient declined  Depression (PHQ2-9): High Risk (08/11/2022)   Depression (PHQ2-9)    PHQ-2 Score: 11  Alcohol  Screen: Not on file  Housing: Low Risk (12/13/2024)   Epic    Unable to Pay for Housing in the Last Year: No    Number of Times Moved in the Last Year: 0    Homeless in the Last Year: No  Utilities: Not  At Risk (12/13/2024)   Epic    Threatened with loss of utilities: No  Health Literacy: Not on file   Family History  Problem Relation Age of Onset   Heart disease Mother        MI 67s   Allergies[1] Prior to Admission medications  Medication Sig Start Date End Date Taking? Authorizing Provider  [Paused] amLODipine  (NORVASC ) 10 MG tablet Take 1 tablet (10 mg total) by mouth daily. Wait to take this until your doctor or other care provider tells you to start again. 03/12/21  Yes [name], [name], MD  aspirin  EC 81 MG tablet Take 81 mg by mouth daily. Swallow whole.   Yes [provider]  atorvastatin  (LIPITOR ) 80 MG tablet Take 1 tablet (80 mg total) by mouth daily. Patient taking differently: Take 80 mg by mouth every evening. 01/30/22  Yes Jadine,  Callie E, PA-C  clopidogrel  (PLAVIX ) 75 MG tablet Take 1 tablet (75 mg total) by mouth daily with breakfast. 01/30/22  Yes Jadine, Callie E, PA-C  [Paused] colchicine  0.6 MG tablet Take 1 tablet (0.6 mg total) by mouth daily. Wait to take this until your doctor or other care provider tells you to start again. 11/09/24  Yes Torrence Marry RAMAN, PA-C  ferrous sulfate  325 (65 FE) MG EC tablet Take 325 mg by mouth every Monday, Wednesday, and Friday.   Yes [provider]  latanoprost  (XALATAN ) 0.005 % ophthalmic solution Place 1 drop into both eyes at bedtime. 06/11/23  Yes [name], Ripudeep K, MD  magnesium  (MAGTAB) 84 MG ( ) TBCR SR tablet Take 84 mg by mouth 3 (three) times daily between meals. 11/03/24  Yes [provider]  mirtazapine  (REMERON ) 15 MG tablet Take 1 tablet (15 mg total) by mouth at bedtime. 06/11/23  Yes [name], Ripudeep K, MD  naloxone  (NARCAN ) nasal spray 4 mg/0.1 mL Place 1 spray into the nose once. Instill 1 spray into one nostril as directed for opioid overdose, call 911 immediately, administer dose, then turn person on side if no response in 2-3 minutes or person responds but relapses. Repeat using a new  spray device and into the other nostril.   Yes [provider]  OLANZapine (ZYPREXA) 5 MG tablet Take 2.5 mg by mouth at bedtime. 11/03/24  Yes [provider]  omeprazole (PRILOSEC) 40 MG capsule Take 40 mg by mouth daily.   Yes [provider]  oxyCODONE -acetaminophen  (PERCOCET/ROXICET) 5-325 MG tablet Take 1 tablet by mouth every 6 (six) hours as needed for severe pain (pain score 7-10). 11/09/24  Yes Dufour, Marry RAMAN, PA-C  polyethylene glycol (MIRALAX  / GLYCOLAX ) 17 g packet Take 17 g by mouth 2 (two) times daily. Patient taking differently: Take 17 g by mouth daily as needed for mild constipation. 04/30/23  Yes Kc, Mennie, MD  SITagliptin 100 MG TABS Take 100 mg by mouth daily at 12 noon. 11/16/23  Yes [provider]  acetaminophen  (TYLENOL ) 500 MG tablet Take 1,000 mg by mouth in the morning, at noon, and at bedtime.    [provider]  albuterol  (PROVENTIL  HFA;VENTOLIN  HFA) 108 (90 BASE) MCG/ACT inhaler Inhale 2 puffs into the lungs every 6 (six) hours as needed for wheezing or shortness of breath.     [provider]  amitriptyline  (ELAVIL ) 25 MG tablet Take 1 tablet (25 mg total) by mouth at bedtime. Patient not taking: Reported on 12/14/2024 05/15/23   [name], MD  amoxicillin -clavulanate (AUGMENTIN ) 875-125 MG tablet Take 1 tablet by mouth every 12 (twelve) hours. 12/16/24   Nguyen, Diana, MD  benzonatate (TESSALON) 100 MG capsule Take 100 mg by mouth 3 (three) times daily as needed for cough. Patient not taking: Reported on 12/14/2024    [provider]  bisacodyl  (DULCOLAX) 5 MG EC tablet Take 1 tablet (5 mg total) by mouth daily as needed for moderate constipation. Patient not taking: Reported on 12/14/2024 06/11/23   [name], [name], MD  Brinzolamide -Brimonidine  1-0.2 % SUSP Place 1 drop into both eyes 3 (three) times daily. Patient not taking: Reported on 12/14/2024    [provider]  carvedilol  (COREG )  6.25 MG tablet Take 1 tablet (6.25 mg total) by mouth 2 (two) times daily with a meal. Patient not taking: Reported on 12/14/2024 07/09/23   Darci Pore, MD  cyanocobalamin  (VITAMIN B12) 500 MCG tablet Take 500 mcg by mouth daily.  Patient not taking: Reported on 12/14/2024    [provider]  cyclobenzaprine  (FLEXERIL ) 5 MG tablet Take 5 mg by mouth every 4 (four) hours as needed for muscle spasms. Patient not taking: Reported on 12/14/2024    [provider]  famotidine  (PEPCID ) 20 MG tablet Take 1 tablet (20 mg total) by mouth daily. Patient not taking: Reported on 12/14/2024 06/12/23   [name], [name], MD  fluticasone -salmeterol (ADVAIR) 500-50 MCG/ACT AEPB Inhale 1 puff into the lungs in the morning and at bedtime. Patient not taking: Reported on 12/14/2024 04/30/23   Christobal Guadalajara, MD  folic acid  (FOLVITE ) 1 MG tablet Take 1 tablet (1 mg total) by mouth daily. Patient not taking: Reported on 12/14/2024 05/16/23   [name], MD  gabapentin  (NEURONTIN ) 100 MG capsule Take 3 capsules (300 mg total) by mouth 3 (three) times daily. 12/16/24   Nguyen, Diana, MD  hydrOXYzine  (ATARAX ) 25 MG tablet Take 1 tablet (25 mg total) by mouth 3 (three) times daily as needed for itching or anxiety. Patient not taking: Reported on 12/14/2024 05/15/23   [name], MD  insulin  aspart (NOVOLOG ) 100 UNIT/ML injection Inject 0-9 Units into the skin 3 (three) times daily with meals. Sliding scale CBG 70 - 120: 0 units CBG 121 - 150: 1 unit,  CBG 151 - 200: 2 units,  CBG 201 - 250: 3 units,  CBG 251 - 300: 5 units,  CBG 301 - 350: 7 units,  CBG 351 - 400: 9 units   CBG > 400: 9 units and notify your MD Patient not taking: Reported on 12/14/2024 06/11/23   [name], [name], MD  insulin  glargine (LANTUS ) 100 UNIT/ML injection Inject 0.2 mLs (20 Units total) into the skin daily. 12/16/24   Nguyen, Diana, MD  Insulin  Syringe-Needle U-100 30G X 1/2 0.5 ML MISC Use with Lantus  05/15/23   [name], MD  ipratropium-albuterol  (DUONEB) 0.5-2.5 (3) MG/3ML SOLN Take 3 mLs by nebulization every 6 (six) hours as needed (for shortness of breath and wheezing). Patient not taking: Reported on 12/14/2024    [provider]  magnesium  hydroxide (MILK OF MAGNESIA) 400 MG/5ML suspension Take 30 mLs by mouth 2 (two) times daily as needed for mild constipation. Patient not taking: Reported on 12/14/2024    [provider]  Magnesium  Oxide 420 MG TABS Take 420 mg by mouth daily. Patient not taking: Reported on 12/14/2024    [provider]  Multiple Vitamin (MULTIVITAMIN WITH MINERALS) TABS tablet Take 1 tablet by mouth daily. Patient not taking: Reported on 12/14/2024    [provider]  nitroGLYCERIN  (NITROSTAT ) 0.4 MG SL tablet Place 1 tablet (0.4 mg total) under the tongue every 5 (five) minutes as needed for chest pain. Patient not taking: Reported on 12/14/2024 01/29/22   Goodrich, Callie E, PA-C  oxyCODONE  (OXY IR/ROXICODONE ) 5 MG immediate release tablet Take 1 tablet (5 mg total) by mouth every 12 (twelve) hours as needed for severe pain (pain score 7-10) or breakthrough pain. 12/16/24   Nguyen, Diana, MD  simethicone  (MYLICON) 40 MG/0.6ML drops Take 0.6 mLs (40 mg total) by mouth every 6 (six) hours as needed for flatulence. Patient not taking: Reported on 12/14/2024 06/11/23   [name], [name], MD  sodium bicarbonate  650 MG tablet Take 1 tablet (650 mg total) by mouth 2 (two) times daily. Patient not taking: Reported on 12/14/2024 06/11/23   [name], [name], MD  thiamine  (VITAMIN B-1) 100 MG tablet Take 1  tablet (100 mg total) by mouth daily. Patient not taking: Reported on 12/14/2024 05/16/23   [name], MD    Family History Reviewed and non-contributory, no pertinent history of problems with bleeding or anesthesia      Review of Systems 14 system ROS conducted and negative except for that noted in HPI   OBJECTIVE  Vitals:Patient Vitals for the  past 8 hrs:  BP Temp Temp src Pulse Resp SpO2 Weight  12/17/24 0750 134/72 98.1 F (36.7 C) Oral 88 16 95 % --  12/17/24 0428 136/64 99.1 F (37.3 C) Oral 98 16 96 % 61.9 kg   General: Alert, no acute distress Cardiovascular: Warm extremities noted Respiratory: No cyanosis, no use of accessory musculature GI: No organomegaly, abdomen is soft and non-tender Skin: No lesions in the area of chief complaint other than those listed below in MSK exam.  Neurologic: Sensation intact distally save for the below mentioned MSK exam Psychiatric: Patient is competent for consent with normal mood and affect Lymphatic: No swelling obvious and reported other than the area involved in the exam below Extremities  LLE: Left great toe with  ulceration and breakdown of nail Mild swelling and edema to left foot No drainage appreciated TTP tip of toe with decreased sensation and sluggish capillary refill EHL/FHL intact Palpable DP pulse    Test Results Imaging MR FOOT LEFT W WO CONTRAST Result Date: 12/16/2024 CLINICAL DATA:  Left great toenail unroofing with wound. Pain. Concern for osteomyelitis. EXAM: MRI OF THE LEFT FOREFOOT WITHOUT AND WITH CONTRAST TECHNIQUE: Multiplanar, multisequence MR imaging of the left foot was performed both before and after administration of intravenous contrast. CONTRAST:  6mL GADAVIST  GADOBUTROL  1 MMOL/ML IV SOLN COMPARISON:  Radiographs of the left foot dated 12/14/2024. FINDINGS: Bones/Joint/Cartilage Marked marrow edema of the distal phalanx of the great toe with suspected decreased T1 hypointensity of the tuft, concerning for osteomyelitis. No convincing marrow signal abnormality identified elsewhere to suggest osteomyelitis. No acute fracture or dislocation. 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 No significant joint effusion. Ligaments Collateral ligaments of the MTP joints appear intact. Lisfranc ligament is intact. Muscles and Tendons No significant  tenosynovitis.  No discrete intramuscular collection. Soft tissue Irregularity at the level of the nailbed. No loculated fluid collection. IMPRESSION: 1. Irregularity at the level of the great toe nailbed with marked marrow edema of the underlying distal phalanx of the great toe, concerning for osteomyelitis. 2. No loculated fluid collection. Electronically Signed   By: Harrietta Sherry M.D.   On: 12/16/2024 16:48   Labs cbc Recent Labs    12/16/24 0610 12/17/24 0505  WBC 2.9* 7.2  HGB 7.8* 7.5*  HCT 22.1* 21.4*  PLT 74* 69*    Labs inflam Recent Labs    12/17/24 0505  CRP 8.2*    Labs coag No results for input(s): INR, PTT in the last 72 hours.  Invalid input(s): PT  Recent Labs    12/16/24 0610 12/17/24 0505  NA 138 138  K 3.5 3.6  CL 109 108  CO2 19* 20*  GLUCOSE 164* 175*  BUN 21 18  CREATININE 1.41* 1.43*  CALCIUM  8.9 8.9     ASSESSMENT AND PLAN: 64 y.o. male with the following: left great toe osteomyelitis.  Patient will likely be headed toward amputation of toe. Will discuss with Foot and ankle specialist, Dr Elsa regarding surgical options. No urgent surgical intervention planned at this time - Continue wound care as needed - Weight  Bearing Status/Activity: WBAT  - Additional recommended labs/tests: None  -VTE Prophylaxis: per primary, no ortho contraindications  - Pain control: as needed     [1]  Allergies Allergen Reactions   Lisinopril -Hydrochlorothiazide  Swelling and Other (See Comments)    Causes swelling of lips   Simvastatin Other (See Comments)    ALT (SGPT) level raised, Aspartate aminotransferase serum level raised   "

## 2024-12-17 NOTE — Plan of Care (Signed)
  Problem: Nutritional: Goal: Maintenance of adequate nutrition will improve Outcome: Progressing   Problem: Education: Goal: Knowledge of General Education information will improve Description: Including pain rating scale, medication(s)/side effects and non-pharmacologic comfort measures Outcome: Progressing   Problem: Clinical Measurements: Goal: Will remain free from infection Outcome: Progressing

## 2024-12-18 DIAGNOSIS — E1169 Type 2 diabetes mellitus with other specified complication: Secondary | ICD-10-CM | POA: Diagnosis not present

## 2024-12-18 DIAGNOSIS — D696 Thrombocytopenia, unspecified: Secondary | ICD-10-CM | POA: Diagnosis not present

## 2024-12-18 DIAGNOSIS — M86172 Other acute osteomyelitis, left ankle and foot: Secondary | ICD-10-CM | POA: Diagnosis not present

## 2024-12-18 DIAGNOSIS — C259 Malignant neoplasm of pancreas, unspecified: Secondary | ICD-10-CM | POA: Diagnosis not present

## 2024-12-18 LAB — CBC WITH DIFFERENTIAL/PLATELET
Basophils Absolute: 0 10*3/uL (ref 0.0–0.1)
Basophils Relative: 0 %
Eosinophils Absolute: 0.4 10*3/uL (ref 0.0–0.5)
Eosinophils Relative: 2 %
HCT: 22.6 % — ABNORMAL LOW (ref 39.0–52.0)
Hemoglobin: 7.9 g/dL — ABNORMAL LOW (ref 13.0–17.0)
Lymphocytes Relative: 12 %
Lymphs Abs: 2.2 10*3/uL (ref 0.7–4.0)
MCH: 25.9 pg — ABNORMAL LOW (ref 26.0–34.0)
MCHC: 35 g/dL (ref 30.0–36.0)
MCV: 74.1 fL — ABNORMAL LOW (ref 80.0–100.0)
Monocytes Absolute: 1.6 10*3/uL — ABNORMAL HIGH (ref 0.1–1.0)
Monocytes Relative: 9 %
Neutro Abs: 14 10*3/uL — ABNORMAL HIGH (ref 1.7–7.7)
Neutrophils Relative %: 77 %
Platelets: 91 10*3/uL — ABNORMAL LOW (ref 150–400)
RBC: 3.05 MIL/uL — ABNORMAL LOW (ref 4.22–5.81)
RDW: 19 % — ABNORMAL HIGH (ref 11.5–15.5)
WBC: 18.2 10*3/uL — ABNORMAL HIGH (ref 4.0–10.5)
nRBC: 2.7 % — ABNORMAL HIGH (ref 0.0–0.2)

## 2024-12-18 LAB — CULTURE, BLOOD (ROUTINE X 2)
Culture: NO GROWTH
Culture: NO GROWTH

## 2024-12-18 LAB — BASIC METABOLIC PANEL WITH GFR
Anion gap: 10 (ref 5–15)
BUN: 18 mg/dL (ref 8–23)
CO2: 20 mmol/L — ABNORMAL LOW (ref 22–32)
Calcium: 9 mg/dL (ref 8.9–10.3)
Chloride: 110 mmol/L (ref 98–111)
Creatinine, Ser: 1.52 mg/dL — ABNORMAL HIGH (ref 0.61–1.24)
GFR, Estimated: 51 mL/min — ABNORMAL LOW
Glucose, Bld: 117 mg/dL — ABNORMAL HIGH (ref 70–99)
Potassium: 3.5 mmol/L (ref 3.5–5.1)
Sodium: 140 mmol/L (ref 135–145)

## 2024-12-18 LAB — GLUCOSE, CAPILLARY
Glucose-Capillary: 112 mg/dL — ABNORMAL HIGH (ref 70–99)
Glucose-Capillary: 177 mg/dL — ABNORMAL HIGH (ref 70–99)
Glucose-Capillary: 269 mg/dL — ABNORMAL HIGH (ref 70–99)
Glucose-Capillary: 272 mg/dL — ABNORMAL HIGH (ref 70–99)
Glucose-Capillary: 365 mg/dL — ABNORMAL HIGH (ref 70–99)
Glucose-Capillary: 383 mg/dL — ABNORMAL HIGH (ref 70–99)

## 2024-12-18 LAB — MAGNESIUM: Magnesium: 1.8 mg/dL (ref 1.7–2.4)

## 2024-12-18 MED ORDER — INSULIN GLARGINE 100 UNIT/ML ~~LOC~~ SOLN
10.0000 [IU] | Freq: Every day | SUBCUTANEOUS | Status: DC
  Administered 2024-12-18: 10 [IU] via SUBCUTANEOUS
  Filled 2024-12-18 (×2): qty 0.1

## 2024-12-18 MED ORDER — AMLODIPINE BESYLATE 10 MG PO TABS
10.0000 mg | ORAL_TABLET | Freq: Every day | ORAL | Status: DC
  Administered 2024-12-18 – 2024-12-20 (×3): 10 mg via ORAL
  Filled 2024-12-18 (×3): qty 1

## 2024-12-18 MED ORDER — FONDAPARINUX SODIUM 7.5 MG/0.6ML ~~LOC~~ SOLN
2.5000 mg | Freq: Once | SUBCUTANEOUS | Status: AC
  Administered 2024-12-18: 2.5 mg via SUBCUTANEOUS
  Filled 2024-12-18: qty 0.6

## 2024-12-18 MED ORDER — FONDAPARINUX SODIUM 2.5 MG/0.5ML ~~LOC~~ SOLN
2.5000 mg | SUBCUTANEOUS | Status: DC
  Filled 2024-12-18: qty 0.5

## 2024-12-18 NOTE — Progress Notes (Signed)
 "  HD#5 SUBJECTIVE:  Patient Summary: Jose [name] is a 64 y.o. male with a history of pancreatic cancer (adenocarcinoma) currently being treated with chemotherapy (gemcitabine and paclitaxel), type 2 diabetes, hypertension, COPD, HLD, CAD, diabetic nephropathy with nodular glomerulosclerosis, CKD stage IIIb, and gout who presented with nausea, vomiting, and abdominal pain and was admitted for neutropenic fever and sepsis, now resolved, but with new concern for left Great toe osteomyelitis.   Overnight Events:  None  Interim History:  Patient feeling very well this morning. His pain is so much improved and he has not required any Dilaudid  since 1330 yesterday. He is eating and drinking well and his spirits are up. Denies nausea or abdominal pain this morning. He is interested to work with PT/OT or the mobility specialist now that his pain is under control while awaiting Ortho recs.   OBJECTIVE:  Vital Signs: Vitals:   12/17/24 1544 12/17/24 1934 12/17/24 2345 12/18/24 0343  BP: 134/74 (!) 141/63 (!) 157/80 (!) 144/85  Pulse: 88 88 81 88  Resp: 18  18 19   Temp: 97.9 F (36.6 C) 98.4 F (36.9 C) 98 F (36.7 C) 98.1 F (36.7 C)  TempSrc: Oral Oral Oral Oral  SpO2: 95% 98% 97% 100%  Weight:      Height:       Supplemental O2: Room Air SpO2: 100 %  Filed Weights   12/15/24 0426 12/16/24 0350 12/17/24 0428  Weight: 61.3 kg 61.5 kg 61.9 kg     Intake/Output Summary (Last 24 hours) at 12/18/2024 9342 Last data filed at 12/17/2024 2200 Gross per 24 hour  Intake 1300 ml  Output 200 ml  Net 1100 ml   Net IO Since Admission: 1,993.67 mL [12/18/24 0657]  Physical Exam:  Constitutional: nontoxic-appearing male lying in hospital bed, in no acute distress HEENT: normocephalic atraumatic, mucous membranes moist Cardiovascular: regular rate and rhythm Pulmonary/Chest: normal work of breathing on room air, lungs clear to auscultation bilaterally Abdominal: soft, non-tender to  light palpation, non-distended  MSK: normal bulk and tone. Right BKA without wounds on RLE. Left Great toe with wound dressing in place and no drainage  Neurological: alert & oriented x 3 Skin: warm and dry Psych: mood calm, behavior normal, thought content normal, judgement normal    Patient Lines/Drains/Airways Status     Active Line/Drains/Airways     Name Placement date Placement time Site Days   Implanted Port 10/10/24 Right Chest Single Power 10/10/24  1000  Chest  69   Negative Pressure Wound Therapy Leg Right 07/01/23  0931  --  536   Wound 12/13/24 2000  Toe (Comment  which one) Left 12/13/24  2000  Toe (Comment  which one)  5            Pertinent labs and imaging:      Latest Ref Rng & Units 12/18/2024    3:54 AM 12/17/2024    5:05 AM 12/16/2024    6:10 AM  CBC  WBC 4.0 - 10.5 K/uL 18.2  7.2  2.9   Hemoglobin 13.0 - 17.0 g/dL 7.9  7.5  7.8   Hematocrit 39.0 - 52.0 % 22.6  21.4  22.1   Platelets 150 - 400 K/uL 91  69  74        Latest Ref Rng & Units 12/18/2024    3:54 AM 12/17/2024    5:05 AM 12/16/2024    6:10 AM  CMP  Glucose 70 - 99 mg/dL 882  824  164   BUN 8 - 23 mg/dL 18  18  21    Creatinine 0.61 - 1.24 mg/dL 8.47  8.56  8.58   Sodium 135 - 145 mmol/L 140  138  138   Potassium 3.5 - 5.1 mmol/L 3.5  3.6  3.5   Chloride 98 - 111 mmol/L 110  108  109   CO2 22 - 32 mmol/L 20  20  19    Calcium  8.9 - 10.3 mg/dL 9.0  8.9  8.9     No results found.  ASSESSMENT/PLAN:  Assessment: Principal Problem:   Neutropenic fever Active Problems:   Hypertension associated with diabetes (HCC)   Dyslipidemia associated with type 2 diabetes mellitus (HCC)   Chronic kidney disease, stage 3b (HCC)   COPD (chronic obstructive pulmonary disease) (HCC)   CAD (coronary artery disease)   Diabetes (HCC)   S/P BKA (below knee amputation) unilateral, right (HCC) - on 05-30-2023   Sepsis (HCC)   Acute encephalopathy   Abdominal pain  Jose [name] is a 64 y.o. male  with a history of pancreatic cancer (adenocarcinoma) currently being treated with chemotherapy (gemcitabine and paclitaxel), type 2 diabetes, hypertension, COPD, HLD, CAD, diabetic nephropathy with nodular glomerulosclerosis, CKD stage IIIb, and gout who presented with nausea, vomiting, and abdominal pain and was admitted for neutropenic fever and sepsis, now resolved, but with new concern for left Great toe osteomyelitis.   Plan: #Left Great toenail unroofing #Left Great toe distal phalanx osteomyelitis  Patient has unroofed left Great toenail. Picture in media tab. Does not appear with erythema or drainage. He reports the toenail fell off recently. Seen [facility]/09/[facility] in [facility] ED for left foot pain and swelling where X-rays obtained unremarkable for osteomyelitis or fracture. Given neutropenia nonrevealing infectious work up, repeated left foot X-ray which did not show evidence of osteomyelitis. Though with continued severe pain, obtained left foot MRI on 1/23 which was concerning for left Great toe distal phalanx osteomyelitis. IV Vanc and Cefepime  resumed. Ortho was consulted and currently deciding on possible toe amputation.  Imaging Data:             - 1/21 left foot X-ray: No osteomyelitis              - 1/23 left foot MRI: left Great toe distal phalanx concerning for osteomyelitis Antibiotic Data:             - s/p IV Vanc q24 hrs (i1/20 - d1/21)                         - resumed after MRI results: IV Vanc q24 hrs (i1/23 - ) - s/p IV Cefepime  2 g q12 hrs (i1/20 - d1/22)             - resumed after MRI results: IV Cefepime  q12 hrs (i1/23 - )  - Ortho consulted, appreciate assistance, awaiting final recs   - possible toe amputation - Left Great toe wound care recs: cleanse wound with normal saline pat dry with gauze and apply 2 layers of Xeroform yellow gauze over wound, cover with 4x4 and rolled Kerlix gauze, secure in place with tape.  - Tylenol  1000 mg TID - Oral Dilaudid  2 mg q4 hrs prn pn   - ensure patient reschedules outpatient Podiatry appointment missed due to admission    #Thrombocytopenia, improving  Thrombocytopenia on labs, by his prior baseline does not appear to have a history of thrombocytopenia. 99 --> 91 -->  74 --> 69 --> 91. Likely due to chemotherapy, though has been on chemo since September 2025. Immature platelet fraction of 1.7, Heparin  induced platelet Ab lab 0.114. Suspect he has now reached his nadir and this morning platelets improving.  - daily CBC with diff, trend platelets  - continue Fondaparinox per Pharmacy    #Neutropenic fever, resolved  #Absolute neutrophil count 100, resolved  #Leukopenia, resolved  #Sepsis, resolved  #Encephalopathy, resolved  #Pancreatic adenocarcinoma on chemotherapy Possible source of infection could be left Great toe which MRI was concerning for osteomyelitis. Resolution of leukopenia 1.1 --> 1.3 --> 1.9 --> 2.9 --> 7.2 and Neurophils 0.1 --> 0.4 --> 0.8 --> 1.4. --> 4.1. He has been afebrile since 1/20 fever. He was resumed on IV Vancomycin  and IV Cefepime  on 1/23 as above after MRI left foot findings concerning for osteomyelitis. Ortho was consulted and awaiting final recs as above. Bump in white count today likely in the setting of one extra dose of GSF. This was discontinued but patient looking well clinically with improvement of pain and no new sick symptoms.  - Oncology consulted, appreciate assistance  - Ortho consulted, appreciate assistance, awaiting final recs  Culture Data:             - 1/20 flu/COVID/RSV: negative             - 1/20 Blood Cultures: NGTD 3 days, final pending              - 1/20: GI Pathogen panel: Negative             - 1/20: C. diff: Negative Imaging Data: - 1/21: Left foot X-ray: No osteomyelitis  - 1/23 left foot MRI: left Great toe distal phalanx concerning for osteomyelitis Antibiotic Data:             - s/p IV Metronidazole  500 mg once (i-d1/20)             - s/p IV Vanc q24 hrs (i1/20  - d1/21)                         - resumed after MRI results: IV Vanc q24 hrs (i1/23 - ) - s/p IV Cefepime  2 g q12 hrs (i1/20 - d1/22)             - resumed after MRI results: IV Cefepime  q12 hrs (i1/23 - )  - Pepcid  - Zofran  - Tylenol  1000 mg TID - Oral Dilaudid  2 mg q4 hrs prn pn  - daily CBC w/ diff  - Discontinue GSF   #Type 2 diabetes #Chronic metabolic acidosis #CKD stage IIIb #Diabetic nephropathy with glomerulosclerosis Home diabetes regimen includes insulin  glargine 20 units daily. 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 Sodium bicarb 650 mg twice daily is listed on his home medication list and on admission bicarb low at 16, though improved to 19-20. Will continue to monitor.  - Hold home insulin  glargine 20 units daily and add as clinically indicated - Insulin  glargine 10 units nightly  - SSI - I/Os - Daily BMP - Continue home Gabapentin  300 mg TID  - Hold home Sodium bicarb 650 mg twice daily, anticipate this will not be continued on discharge    #Hypertension #Hyperlipidemia #CAD Blood pressure initially elevated here but now normotensive.  We will hold antihypertensives for now.  No reports of chest pain today. Will plan to resume home medications Aspirin  81 mg daily, Plavix  75 mg daily, and Atorvastatin  80 mg daily. Originally held home amlodipine  and carvedilol  originally iso possible shock. Though BP has been normotensive to hypertensive so resumed Carvedilol  to start with, will resume amlodipine  today given elevated BP despite patient's pain being under control.  - Continue home Aspirin  81 mg - Continue home Plavix  75 mg daily  - Continue home Atorvastatin  80 mg daily - Continue home Carvedilol  6.25 mg twice daily - Resume home Amlodipine  10 mg daily - Sublingual  nitroglycerin    #COPD No significant respiratory symptoms here and he does appear to be on LABA/ICS at home.  We will continue this while admitted and if needed will add DuoNebs. - Continue home LABA/ICS - Duonebs prn    #Protein Calorie Deficit #Nutritional deficiencies  Patient takes home Ensure, Folic acid  1 mg daily, Vitamin B12 500 mcg daily, Mirtazapine  15 mg daily, and Thiamine  100 mg daily. Resumed these and allowing him to drink as tolerated starting with CLD.  - Continue home Folic acid  1 mg daily - Continue home Vitamin B12 500 mcg daily - Continue home Mirtazapine  15 mg daily - Continue home Thiamine  100 mg daily - Continue Ensure shakes   #GERD Takes home Omeprazole 40 mg daily. Giving Protonix  40 mg BID while admitted.  - Protonix  40 mg BID   Best Practice: Diet: Cardiac diet VTE: fondaparinux  (ARIXTRA ) injection 2.5 mg Start: 12/19/24 1000 Code: Full  Disposition planning: Therapy Recs: Pending, DME: pending  DISPO: Anticipated discharge in 2-3 days to pending pending IV antibiotics and possible surgical intervention with Ortho.  Signature:  Doyal Miyamoto, MD [facility]  6:57 AM, 12/18/2024  On Call pager [phone]  "

## 2024-12-19 ENCOUNTER — Other Ambulatory Visit (HOSPITAL_COMMUNITY): Payer: Self-pay

## 2024-12-19 DIAGNOSIS — D696 Thrombocytopenia, unspecified: Secondary | ICD-10-CM

## 2024-12-19 DIAGNOSIS — D72829 Elevated white blood cell count, unspecified: Secondary | ICD-10-CM

## 2024-12-19 DIAGNOSIS — E1122 Type 2 diabetes mellitus with diabetic chronic kidney disease: Secondary | ICD-10-CM | POA: Diagnosis not present

## 2024-12-19 DIAGNOSIS — E785 Hyperlipidemia, unspecified: Secondary | ICD-10-CM | POA: Diagnosis not present

## 2024-12-19 DIAGNOSIS — E1121 Type 2 diabetes mellitus with diabetic nephropathy: Secondary | ICD-10-CM | POA: Diagnosis not present

## 2024-12-19 DIAGNOSIS — E8722 Chronic metabolic acidosis: Secondary | ICD-10-CM | POA: Diagnosis not present

## 2024-12-19 DIAGNOSIS — M869 Osteomyelitis, unspecified: Secondary | ICD-10-CM

## 2024-12-19 DIAGNOSIS — J449 Chronic obstructive pulmonary disease, unspecified: Secondary | ICD-10-CM | POA: Diagnosis not present

## 2024-12-19 DIAGNOSIS — I129 Hypertensive chronic kidney disease with stage 1 through stage 4 chronic kidney disease, or unspecified chronic kidney disease: Secondary | ICD-10-CM | POA: Diagnosis not present

## 2024-12-19 DIAGNOSIS — C259 Malignant neoplasm of pancreas, unspecified: Secondary | ICD-10-CM | POA: Diagnosis not present

## 2024-12-19 DIAGNOSIS — N1832 Chronic kidney disease, stage 3b: Secondary | ICD-10-CM | POA: Diagnosis not present

## 2024-12-19 DIAGNOSIS — I2510 Atherosclerotic heart disease of native coronary artery without angina pectoris: Secondary | ICD-10-CM | POA: Diagnosis not present

## 2024-12-19 DIAGNOSIS — M86172 Other acute osteomyelitis, left ankle and foot: Secondary | ICD-10-CM

## 2024-12-19 LAB — CBC WITH DIFFERENTIAL/PLATELET
Basophils Absolute: 0 10*3/uL (ref 0.0–0.1)
Basophils Relative: 0 %
Eosinophils Absolute: 0.2 10*3/uL (ref 0.0–0.5)
Eosinophils Relative: 1 %
HCT: 21.4 % — ABNORMAL LOW (ref 39.0–52.0)
Hemoglobin: 7.6 g/dL — ABNORMAL LOW (ref 13.0–17.0)
Lymphocytes Relative: 6 %
Lymphs Abs: 1.2 10*3/uL (ref 0.7–4.0)
MCH: 25.9 pg — ABNORMAL LOW (ref 26.0–34.0)
MCHC: 35.5 g/dL (ref 30.0–36.0)
MCV: 72.8 fL — ABNORMAL LOW (ref 80.0–100.0)
Monocytes Absolute: 1.2 10*3/uL — ABNORMAL HIGH (ref 0.1–1.0)
Monocytes Relative: 6 %
Neutro Abs: 17.8 10*3/uL — ABNORMAL HIGH (ref 1.7–7.7)
Neutrophils Relative %: 87 %
Platelets: 122 10*3/uL — ABNORMAL LOW (ref 150–400)
RBC: 2.94 MIL/uL — ABNORMAL LOW (ref 4.22–5.81)
RDW: 19.3 % — ABNORMAL HIGH (ref 11.5–15.5)
WBC: 20.5 10*3/uL — ABNORMAL HIGH (ref 4.0–10.5)
nRBC: 1.5 % — ABNORMAL HIGH (ref 0.0–0.2)

## 2024-12-19 LAB — BASIC METABOLIC PANEL WITH GFR
Anion gap: 10 (ref 5–15)
BUN: 18 mg/dL (ref 8–23)
CO2: 20 mmol/L — ABNORMAL LOW (ref 22–32)
Calcium: 8.9 mg/dL (ref 8.9–10.3)
Chloride: 106 mmol/L (ref 98–111)
Creatinine, Ser: 1.61 mg/dL — ABNORMAL HIGH (ref 0.61–1.24)
GFR, Estimated: 47 mL/min — ABNORMAL LOW
Glucose, Bld: 256 mg/dL — ABNORMAL HIGH (ref 70–99)
Potassium: 3.4 mmol/L — ABNORMAL LOW (ref 3.5–5.1)
Sodium: 136 mmol/L (ref 135–145)

## 2024-12-19 LAB — GLUCOSE, CAPILLARY
Glucose-Capillary: 185 mg/dL — ABNORMAL HIGH (ref 70–99)
Glucose-Capillary: 241 mg/dL — ABNORMAL HIGH (ref 70–99)
Glucose-Capillary: 274 mg/dL — ABNORMAL HIGH (ref 70–99)
Glucose-Capillary: 283 mg/dL — ABNORMAL HIGH (ref 70–99)
Glucose-Capillary: 326 mg/dL — ABNORMAL HIGH (ref 70–99)
Glucose-Capillary: 339 mg/dL — ABNORMAL HIGH (ref 70–99)

## 2024-12-19 LAB — MAGNESIUM: Magnesium: 1.6 mg/dL — ABNORMAL LOW (ref 1.7–2.4)

## 2024-12-19 MED ORDER — POTASSIUM CHLORIDE 20 MEQ PO PACK
40.0000 meq | PACK | Freq: Once | ORAL | Status: AC
  Administered 2024-12-19: 40 meq via ORAL
  Filled 2024-12-19: qty 2

## 2024-12-19 MED ORDER — FONDAPARINUX SODIUM 2.5 MG/0.5ML ~~LOC~~ SOLN
2.5000 mg | SUBCUTANEOUS | Status: DC
  Administered 2024-12-20: 2.5 mg via SUBCUTANEOUS
  Filled 2024-12-19: qty 0.5

## 2024-12-19 MED ORDER — INSULIN GLARGINE 100 UNIT/ML ~~LOC~~ SOLN
20.0000 [IU] | Freq: Every day | SUBCUTANEOUS | Status: DC
  Administered 2024-12-19: 20 [IU] via SUBCUTANEOUS
  Filled 2024-12-19 (×2): qty 0.2

## 2024-12-19 MED ORDER — ACETAMINOPHEN 500 MG PO TABS
1000.0000 mg | ORAL_TABLET | Freq: Four times a day (QID) | ORAL | Status: DC
  Administered 2024-12-19 – 2024-12-20 (×3): 1000 mg via ORAL
  Filled 2024-12-19 (×4): qty 2

## 2024-12-19 MED ORDER — INSULIN ASPART 100 UNIT/ML IJ SOLN
0.0000 [IU] | Freq: Three times a day (TID) | INTRAMUSCULAR | Status: DC
  Administered 2024-12-19: 5 [IU] via SUBCUTANEOUS
  Administered 2024-12-19: 7 [IU] via SUBCUTANEOUS
  Administered 2024-12-20: 5 [IU] via SUBCUTANEOUS
  Administered 2024-12-20: 2 [IU] via SUBCUTANEOUS
  Filled 2024-12-19: qty 2
  Filled 2024-12-19: qty 7
  Filled 2024-12-19: qty 5

## 2024-12-19 MED ORDER — FONDAPARINUX SODIUM 7.5 MG/0.6ML ~~LOC~~ SOLN
2.5000 mg | Freq: Once | SUBCUTANEOUS | Status: AC
  Administered 2024-12-19: 2.5 mg via SUBCUTANEOUS
  Filled 2024-12-19: qty 0.6

## 2024-12-19 MED ORDER — INSULIN GLARGINE 100 UNIT/ML ~~LOC~~ SOLN
5.0000 [IU] | Freq: Once | SUBCUTANEOUS | Status: AC
  Administered 2024-12-19: 5 [IU] via SUBCUTANEOUS
  Filled 2024-12-19: qty 0.05

## 2024-12-19 MED ORDER — ORAL CARE MOUTH RINSE: 15.0000 mL | OROMUCOSAL | Status: DC | PRN

## 2024-12-19 MED ORDER — MAGNESIUM SULFATE 2 GM/50ML IV SOLN
2.0000 g | Freq: Once | INTRAVENOUS | Status: AC
  Administered 2024-12-19: 2 g via INTRAVENOUS
  Filled 2024-12-19: qty 50

## 2024-12-19 MED ORDER — INSULIN ASPART 100 UNIT/ML IJ SOLN: 0.0000 [IU] | Freq: Every day | INTRAMUSCULAR | Status: DC

## 2024-12-19 NOTE — Evaluation (Signed)
 Occupational Therapy Evaluation Patient Details Name: Jose [name] MRN: [id number] DOB: 1961-01-02 Today's Date: 12/19/2024   History of Present Illness   Pt is a 64 y/o male presenting with N&V and abdominal pain after chemo infusion 12/09/24. Found to have sepsis, neutropenic fever. Also with L great toe pain in setting of osteomyelitis. Pending potential surgical intervention. PMH: pancreatic CA, DM2, HTN, COPD, HLD, CAD, diabetic neuropathy     Clinical Impressions PTA, pt lives alone in a handicapped accessible apartment. Pt reports being Modified Independent with wheelchair transfers, wheelchair mobility and majority of ADLs. Pt reports daily HH aide assist with showering, household IADLs and transportation as needed. Pt presents now limited by L toe pain (radiating up LE) despite premedication so evaluation limited to bed level today. However, pt moving well in bed and able to manage various ADLs with Setup to Min A. Anticipate with pain control that pt will be able to DC directly home. Pt would benefit from [facility] and increased HH aide hours through [place] if possible. Will continue to follow acutely.     If plan is discharge home, recommend the following:   A little help with walking and/or transfers;A little help with bathing/dressing/bathroom;Assistance with cooking/housework;Assist for transportation     Functional Status Assessment   Patient has had a recent decline in their functional status and demonstrates the ability to make significant improvements in function in a reasonable and predictable amount of time.     Equipment Recommendations   None recommended by OT     Recommendations for Other Services         Precautions/Restrictions   Precautions Precautions: Fall Precaution/Restrictions Comments: hx of R AKA (no prosthesis) Restrictions Weight Bearing Restrictions Per Provider Order: No     Mobility Bed Mobility Overal bed mobility: Needs  Assistance Bed Mobility: Rolling Rolling: Modified independent (Device/Increase time)         General bed mobility comments: Easily rolling side to side to change bed pad. Able to long sit for UB ADLs in bed but declined EOB today    Transfers                   General transfer comment: declined OOB attempts today      Balance Overall balance assessment:  (to be further assessed)                                         ADL either performed or assessed with clinical judgement   ADL Overall ADL's : Needs assistance/impaired Eating/Feeding: Independent   Grooming: Set up;Bed level;Sitting;Wash/dry face;Oral care Grooming Details (indicate cue type and reason): long sitting in bed Upper Body Bathing: Set up;Bed level   Lower Body Bathing: Set up;Sitting/lateral leans;Bed level   Upper Body Dressing : Set up;Bed level   Lower Body Dressing: Minimal assistance;Bed level;Sitting/lateral leans                 General ADL Comments: Limited to bed level as pt declined EOB/OOB attempts due to L toe pain and tearful about ongoing medical situations. pt able to manage various UB ADL bed level and excellent bed mobility     Vision Ability to See in Adequate Light: 0 Adequate Patient Visual Report: No change from baseline Vision Assessment?: No apparent visual deficits     Perception         Praxis  Pertinent Vitals/Pain Pain Assessment Pain Assessment: Faces Faces Pain Scale: Hurts even more Pain Location: L great toe Pain Descriptors / Indicators: Guarding, Grimacing Pain Intervention(s): Monitored during session, Limited activity within patient's tolerance, Premedicated before session     Extremity/Trunk Assessment Upper Extremity Assessment Upper Extremity Assessment: Overall WFL for tasks assessed;Right hand dominant   Lower Extremity Assessment Lower Extremity Assessment: Defer to PT evaluation   Cervical / Trunk  Assessment Cervical / Trunk Assessment: Normal   Communication Communication Communication: No apparent difficulties   Cognition Arousal: Alert Behavior During Therapy: WFL for tasks assessed/performed Cognition: No apparent impairments                               Following commands: Intact       Cueing  General Comments   Cueing Techniques: Verbal cues      Exercises     Shoulder Instructions      Home Living Family/patient expects to be discharged to:: Private residence Living Arrangements: Alone Available Help at Discharge: Friend(s);Personal care attendant;Available PRN/intermittently Type of Home: Apartment Home Access: Ramped entrance     Home Layout: One level     Bathroom Shower/Tub: Producer, Television/film/video: Handicapped height     Home Equipment: Shower seat;Wheelchair - manual   Additional Comments: Aide daily for 2 hours      Prior Functioning/Environment Prior Level of Function : Needs assist             Mobility Comments: using wheelchair for mobility, has not gotten a RLE prosthesis ADLs Comments: Has aide assist for showering, meds and breakfast. [facility] assists [facility]. friends and aide assist with groceries. pt reports able to manage ADLs on his own when aide not there    OT Problem List: Decreased activity tolerance;Impaired balance (sitting and/or standing);Pain   OT Treatment/Interventions: Self-care/ADL training;Therapeutic exercise;Energy conservation;DME and/or AE instruction;Therapeutic activities;Patient/family education;Balance training      OT Goals(Current goals can be found in the care plan section)   Acute Rehab OT Goals Patient Stated Goal: pain control, not have to go through same thing with LLE OT Goal Formulation: With patient Time For Goal Achievement: 01/02/25 Potential to Achieve Goals: Good ADL Goals Pt Will Perform Lower Body Bathing: with set-up;sitting/lateral leans;sit  to/from stand Pt Will Perform Lower Body Dressing: with set-up;sitting/lateral leans;bed level Pt Will Transfer to Toilet: with set-up;squat pivot transfer;bedside commode Pt Will Perform Toileting - Clothing Manipulation and hygiene: with set-up;sit to/from stand;sitting/lateral leans   OT Frequency:  Min 2X/week    Co-evaluation              AM-PAC OT 6 Clicks Daily Activity     Outcome Measure Help from another person eating meals?: None Help from another person taking care of personal grooming?: A Little Help from another person toileting, which includes using toliet, bedpan, or urinal?: A Little Help from another person bathing (including washing, rinsing, drying)?: A Little Help from another person to put on and taking off regular upper body clothing?: A Little Help from another person to put on and taking off regular lower body clothing?: A Little 6 Click Score: 19   End of Session Nurse Communication: Mobility status  Activity Tolerance: Patient tolerated treatment well Patient left: in bed;with call bell/phone within reach  OT Visit Diagnosis: Other abnormalities of gait and mobility (R26.89)  Time: 8954-8886 OT Time Calculation (min): 28 min Charges:  OT General Charges $OT Visit: 1 Visit OT Evaluation $OT Eval Moderate Complexity: 1 Mod  Mliss NOVAK, OTR/L Acute Rehab Services Office: [phone]   Mliss Fish 12/19/2024, 12:02 PM

## 2024-12-19 NOTE — Inpatient Diabetes Management (Signed)
 Inpatient Diabetes Program Recommendations  AACE/ADA: New Consensus Statement on Inpatient Glycemic Control (2015)  Target Ranges:  Prepandial:   less than 140 mg/dL      Peak postprandial:   less than 180 mg/dL (1-2 hours)      Critically ill patients:  140 - 180 mg/dL   Lab Results  Component Value Date   GLUCAP 339 (H) 12/19/2024   HGBA1C 7.8 (H) 12/13/2024    Review of Glycemic Control  Diabetes history: DM2 Outpatient Diabetes medications: Lantus  20 units daily, Novolog  0-9 TID with meals, Januvia 100 mg daily Current orders for Inpatient glycemic control: Novolog  0-6 Q4H, Lantus  20 at bedtime, Lantus  5 units (1x dose at 0900 today)  Inpatient Diabetes Program Recommendations:    Add carbohydrate mod to heart healthy diet  Consider Novolog  0-9 TID with meals and 0-5 HS since pt on PO diet  Follow trends.  Thank you. Shona Brandy, RD, LDN, CDCES Inpatient Diabetes Coordinator [phone]

## 2024-12-19 NOTE — Progress Notes (Signed)
 "  HD#6 SUBJECTIVE:  Patient Summary: Jose [name] is a 64 y.o. male with a history of pancreatic cancer (adenocarcinoma) currently being treated with chemotherapy (gemcitabine and paclitaxel), type 2 diabetes, hypertension, COPD, HLD, CAD, diabetic nephropathy with nodular glomerulosclerosis, CKD stage IIIb, and gout who presented with nausea, vomiting, and abdominal pain and was admitted for neutropenic fever and sepsis, now resolved, but with new concern for left Great toe osteomyelitis.    Overnight Events:  None  Interim History:  This morning Jose [name] had some more left toe pain that increased overnight. Otherwise is doing well, nausea and abdominal pain have subsided for a few days and he is eating well. He has had daily BM that are soft. No new fevers.   OBJECTIVE:  Vital Signs: Vitals:   12/18/24 1527 12/18/24 1925 12/18/24 2257 12/19/24 0426  BP: (!) 129/55 132/67 (!) 142/76 (!) 143/71  Pulse: 96 76 85 79  Resp:  17 19 14   Temp: 97.7 F (36.5 C) 98.1 F (36.7 C) 98.2 F (36.8 C) 98.2 F (36.8 C)  TempSrc: Oral Oral Oral Oral  SpO2: 95% 100% 100%   Weight:      Height:       Supplemental O2: Room Air SpO2: 100 %  Filed Weights   12/15/24 0426 12/16/24 0350 12/17/24 0428  Weight: 61.3 kg 61.5 kg 61.9 kg     Intake/Output Summary (Last 24 hours) at 12/19/2024 9346 Last data filed at 12/19/2024 9572 Gross per 24 hour  Intake 960 ml  Output 1150 ml  Net -190 ml   Net IO Since Admission: 1,803.67 mL [12/19/24 0653]  Physical Exam:  Constitutional: nontoxic-appearing male lying in hospital bed, in no acute distress HEENT: normocephalic atraumatic, mucous membranes moist Cardiovascular: regular rate and rhythm Pulmonary/Chest: normal work of breathing on room air, lungs clear to auscultation bilaterally Abdominal: soft, non-tender to light palpation, non-distended  MSK: normal bulk and tone. Right BKA without wounds on RLE. Left Great toe with wound  dressing in place and no drainage  Neurological: alert & oriented x 3 Skin: warm and dry Psych: mood calm, behavior normal, thought content normal, judgement normal    Patient Lines/Drains/Airways Status     Active Line/Drains/Airways     Name Placement date Placement time Site Days   Implanted Port 10/10/24 Right Chest Single Power 10/10/24  1000  Chest  70   Negative Pressure Wound Therapy Leg Right 07/01/23  0931  --  537   Wound 12/13/24 2000  Toe (Comment  which one) Left 12/13/24  2000  Toe (Comment  which one)  6            Pertinent labs and imaging:      Latest Ref Rng & Units 12/19/2024    4:50 AM 12/18/2024    3:54 AM 12/17/2024    5:05 AM  CBC  WBC 4.0 - 10.5 K/uL 20.5  18.2  7.2   Hemoglobin 13.0 - 17.0 g/dL 7.6  7.9  7.5   Hematocrit 39.0 - 52.0 % 21.4  22.6  21.4   Platelets 150 - 400 K/uL 122  91  69        Latest Ref Rng & Units 12/19/2024    4:50 AM 12/18/2024    3:54 AM 12/17/2024    5:05 AM  CMP  Glucose 70 - 99 mg/dL 743  882  824   BUN 8 - 23 mg/dL 18  18  18    Creatinine 0.61 -  1.24 mg/dL 8.38  8.47  8.56   Sodium 135 - 145 mmol/L 136  140  138   Potassium 3.5 - 5.1 mmol/L 3.4  3.5  3.6   Chloride 98 - 111 mmol/L 106  110  108   CO2 22 - 32 mmol/L 20  20  20    Calcium  8.9 - 10.3 mg/dL 8.9  9.0  8.9     No results found.  ASSESSMENT/PLAN:  Assessment: Principal Problem:   Neutropenic fever Active Problems:   Hypertension associated with diabetes (HCC)   Dyslipidemia associated with type 2 diabetes mellitus (HCC)   Chronic kidney disease, stage 3b (HCC)   COPD (chronic obstructive pulmonary disease) (HCC)   CAD (coronary artery disease)   Diabetes (HCC)   S/P BKA (below knee amputation) unilateral, right (HCC) - on 05-30-2023   Sepsis (HCC)   Acute encephalopathy   Abdominal pain  Jose [name] is a 64 y.o. male with a history of pancreatic cancer (adenocarcinoma) currently being treated with chemotherapy (gemcitabine and  paclitaxel), type 2 diabetes, hypertension, COPD, HLD, CAD, diabetic nephropathy with nodular glomerulosclerosis, CKD stage IIIb, and gout who presented with nausea, vomiting, and abdominal pain and was admitted for neutropenic fever and sepsis, now resolved, but with new concern for left Great toe osteomyelitis.   Plan: #Left Great toenail unroofing #Left Great toe distal phalanx osteomyelitis  Left foot MRI on 1/23 which was concerning for left Great toe distal phalanx osteomyelitis. Currently on IV Vanc and Cefepime . Ortho was consulted and currently deciding on possible toe amputation, awaiting final recommendations. Working on pain control.  Imaging Data:             - 1/21 left foot X-ray: No osteomyelitis              - 1/23 left foot MRI: left Great toe distal phalanx concerning for osteomyelitis Antibiotic Data:             - s/p IV Vanc q24 hrs (i1/20 - d1/21)                         - resumed after MRI results: IV Vanc q24 hrs (i1/23 - ) - s/p IV Cefepime  2 g q12 hrs (i1/20 - d1/22)             - resumed after MRI results: IV Cefepime  q12 hrs (i1/23 - )  - Ortho consulted, appreciate assistance, awaiting final recs              - possible toe amputation - Left Great toe wound care recs: cleanse wound with normal saline pat dry with gauze and apply 2 layers of Xeroform yellow gauze over wound, cover with 4x4 and rolled Kerlix gauze, secure in place with tape.  - Tylenol  1000 mg TID - Oral Dilaudid  2 mg q4 hrs prn pn  - ensure patient reschedules outpatient Podiatry appointment missed due to admission    #Thrombocytopenia, improving  Thrombocytopenia on labs, by his prior baseline does not appear to have a history of thrombocytopenia. 99 --> 91 --> 74 --> 69 --> 91 --> 122. Likely due to chemotherapy, though has been on chemo since September 2025. Immature platelet fraction of 1.7, Heparin  induced platelet Ab lab 0.114. Reached his nadir and platelets improving.  - daily CBC with  diff, trend platelets  - continue Fondaparinox per Pharmacy    #Neutropenic fever, resolved  #Absolute neutrophil count 100, resolved  #Leukopenia, resolved  #  Sepsis, resolved  #Encephalopathy, resolved  #Pancreatic adenocarcinoma on chemotherapy Possible source of infection could be left Great toe which MRI was concerning for osteomyelitis. Resolution of leukopenia 1.1 --> 1.3 --> 1.9 --> 2.9 --> 7.2 and Neurophils 0.1 --> 0.4 --> 0.8 --> 1.4. --> 4.1. He has been afebrile since 1/20 fever. He was resumed on IV Vancomycin  and IV Cefepime  on 1/23 as above after MRI left foot findings concerning for osteomyelitis. Ortho was consulted and awaiting final recs as above. Bump in white count ikely in the setting of one extra dose of GSF. This was discontinued but patient looking well clinically with no new sick symptoms.  - Oncology consulted, appreciate assistance  - Ortho consulted, appreciate assistance, awaiting final recs  Culture Data:             - 1/20 flu/COVID/RSV: negative             - 1/20 Blood Cultures: NGTD 3 days, final pending              - 1/20: GI Pathogen panel: Negative             - 1/20: C. diff: Negative Imaging Data: - 1/21: Left foot X-ray: No osteomyelitis  - 1/23 left foot MRI: left Great toe distal phalanx concerning for osteomyelitis Antibiotic Data:             - s/p IV Metronidazole  500 mg once (i-d1/20)             - s/p IV Vanc q24 hrs (i1/20 - d1/21)                         - resumed after MRI results: IV Vanc q24 hrs (i1/23 - ) - s/p IV Cefepime  2 g q12 hrs (i1/20 - d1/22)             - resumed after MRI results: IV Cefepime  q12 hrs (i1/23 - )  - Pepcid  - Zofran  - Tylenol  1000 mg TID - Oral Dilaudid  2 mg q4 hrs prn pn  - daily CBC w/ diff  - Discontinue GSF   #Type 2 diabetes #Chronic metabolic acidosis #CKD stage IIIb #Diabetic nephropathy with glomerulosclerosis Home diabetes regimen includes insulin  glargine 20 units daily. 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 Sodium bicarb 650 mg twice daily is listed on his home medication list and on admission bicarb low at 16, though improved to 19-20. Will continue to monitor.  - Resume home insulin  glargine 20 units daily - SSI - I/Os - Daily BMP - Continue home Gabapentin  300 mg TID  - Hold home Sodium bicarb 650 mg twice daily, anticipate this will not be continued on discharge    #Hypertension #Hyperlipidemia #CAD Blood pressure initially elevated here but now normotensive.  We will hold antihypertensives for now.  No reports of chest pain today. Will plan to resume home medications Aspirin  81 mg daily, Plavix  75 mg daily, and Atorvastatin  80 mg daily. Originally held home amlodipine  and carvedilol  originally iso possible shock, though have since resumed both medications given normo-mildly hypertensive. - Continue home Aspirin  81 mg - Continue home Plavix  75 mg daily  - Continue home Atorvastatin  80 mg daily - Continue home Carvedilol  6.25 mg twice daily - Continue home Amlodipine  10 mg daily - Sublingual nitroglycerin    #COPD  No significant respiratory symptoms here and he does appear to be on LABA/ICS at home.  We will continue this while admitted and if needed will add DuoNebs. - Continue home LABA/ICS - Duonebs prn    #Protein Calorie Deficit #Nutritional deficiencies  Patient takes home Ensure, Folic acid  1 mg daily, Vitamin B12 500 mcg daily, Mirtazapine  15 mg daily, and Thiamine  100 mg daily. Resumed these and allowing him to drink as tolerated starting with CLD.  - Continue home Folic acid  1 mg daily - Continue home Vitamin B12 500 mcg daily - Continue home Mirtazapine  15 mg daily - Continue home Thiamine  100 mg daily - Continue Ensure shakes   #GERD Takes home  Omeprazole 40 mg daily. Giving Protonix  40 mg BID while admitted.  - Protonix  40 mg BID   Best Practice: Diet: Cardiac diet VTE: fondaparinux  (ARIXTRA ) injection 2.5 mg Start: 12/20/24 1000 Code: Full  Disposition planning: Therapy Recs: Pending, DME: pending  DISPO: Anticipated discharge in 2-3 days to pending pending IV antibiotics and possible surigcal intervention with Ortho.  Signature:  Doyal Miyamoto, MD [facility]  6:53 AM, 12/19/2024  On Call pager [phone]  "

## 2024-12-19 NOTE — Evaluation (Signed)
 Physical Therapy Evaluation Patient Details Name: Jose [name] MRN: [id number] DOB: 09-24-1961 Today's Date: 12/19/2024  History of Present Illness  Pt is a 64 y/o male presenting with N&V and abdominal pain after chemo infusion 12/09/24. Found to have sepsis, neutropenic fever. Also with L great toe pain in setting of osteomyelitis. Pending potential surgical intervention. PMH: pancreatic CA, DM2, HTN, COPD, HLD, CAD, diabetic neuropathy  Clinical Impression  PTA lived alone in handicap accessible apartment, with 2 hr HHAide assistance daily, for ADLs. Pt reports modI for bed mobility and transfers to and from wheelchair. Pt limited in safe mobility by being overwhelmed by multiple medical conditions since his cancer diagnosis. Pt is able to come to longsitting in bed and participate in BADLs with setup. With better pain control pt should be able to discharge home with HHPT and would benefit from increased HHAide hours through the [place] if possible. PT will continue to follow acutely and refer to Mobility Specialist.          If plan is discharge home, recommend the following: A lot of help with walking and/or transfers;A little help with bathing/dressing/bathroom;Assistance with cooking/housework;Assist for transportation;Help with stairs or ramp for entrance   Can travel by private vehicle        Equipment Recommendations None recommended by PT     Functional Status Assessment Patient has had a recent decline in their functional status and demonstrates the ability to make significant improvements in function in a reasonable and predictable amount of time.     Precautions / Restrictions Precautions Precautions: Fall Precaution/Restrictions Comments: hx of R AKA (no prosthesis) Restrictions Weight Bearing Restrictions Per Provider Order: No      Mobility  Bed Mobility Overal bed mobility: Needs Assistance Bed Mobility: Rolling, Supine to Sit Rolling: Modified independent  (Device/Increase time)   Supine to sit: Modified independent (Device/Increase time)     General bed mobility comments: Easily rolling side to side to change bed pad. Able to long sit for UB ADLs in bed but declined EOB today    Transfers                   General transfer comment: declined OOB attempts today        Balance Overall balance assessment:  (to be further assessed)                                           Pertinent Vitals/Pain Pain Assessment Pain Assessment: Faces Faces Pain Scale: Hurts even more Pain Location: L great toe Pain Descriptors / Indicators: Guarding, Grimacing Pain Intervention(s): Limited activity within patient's tolerance, Monitored during session, Repositioned    Home Living Family/patient expects to be discharged to:: Private residence Living Arrangements: Alone Available Help at Discharge: Friend(s);Personal care attendant;Available PRN/intermittently Type of Home: Apartment Home Access: Ramped entrance       Home Layout: One level Home Equipment: Shower seat;Wheelchair - manual Additional Comments: Aide daily for 2 hours    Prior Function Prior Level of Function : Needs assist             Mobility Comments: using wheelchair for mobility, has not gotten a RLE prosthesis ADLs Comments: Has aide assist for showering, meds and breakfast. [facility] assists with transportation. friends and aide assist with groceries. pt reports able to manage ADLs on his own when aide not there  Extremity/Trunk Assessment   Upper Extremity Assessment Upper Extremity Assessment: Defer to OT evaluation    Lower Extremity Assessment Lower Extremity Assessment: RLE deficits/detail;LLE deficits/detail RLE Deficits / Details: R AKA LLE Deficits / Details: L great toe with large black spot, ROM WFL, strength grossly 4/5 LLE Sensation: decreased light touch    Cervical / Trunk Assessment Cervical / Trunk Assessment: Normal   Communication   Communication Communication: No apparent difficulties    Cognition Arousal: Alert Behavior During Therapy: WFL for tasks assessed/performed                             Following commands: Intact       Cueing Cueing Techniques: Verbal cues     General Comments General comments (skin integrity, edema, etc.): Pt reports being overwhelmed by what has been going on with his health since he was diagnosed with cancer        Assessment/Plan    PT Assessment Patient needs continued PT services  PT Problem List Decreased balance;Decreased mobility;Decreased safety awareness;Impaired sensation;Pain       PT Treatment Interventions DME instruction;Gait training;Functional mobility training;Therapeutic activities;Therapeutic exercise;Balance training;Cognitive remediation;Patient/family education    PT Goals (Current goals can be found in the Care Plan section)  Acute Rehab PT Goals Patient Stated Goal: go home PT Goal Formulation: With patient Time For Goal Achievement: 01/02/25 Potential to Achieve Goals: Fair    Frequency Min 3X/week        AM-PAC PT 6 Clicks Mobility  Outcome Measure Help needed turning from your back to your side while in a flat bed without using bedrails?: None Help needed moving from lying on your back to sitting on the side of a flat bed without using bedrails?: None Help needed moving to and from a bed to a chair (including a wheelchair)?: A Lot Help needed standing up from a chair using your arms (e.g., wheelchair or bedside chair)?: A Lot Help needed to walk in hospital room?: A Lot Help needed climbing 3-5 steps with a railing? : Total 6 Click Score: 15    End of Session   Activity Tolerance: Patient tolerated treatment well Patient left: in bed;with call bell/phone within reach;with bed alarm set Nurse Communication: Mobility status PT Visit Diagnosis: Difficulty in walking, not elsewhere classified  (R26.2);Unsteadiness on feet (R26.81);Muscle weakness (generalized) (M62.81)    Time: 8949-8884 PT Time Calculation (min) (ACUTE ONLY): 25 min   Charges:   PT Evaluation $PT Eval Low Complexity: 1 Low   PT General Charges $$ ACUTE PT VISIT: 1 Visit         Shahzain Kiester B. [name] Lapidus PT, DPT Acute Rehabilitation Services Please use secure chat or  Call Office [phone]   Jose [name] [facility] 12/19/2024, 4:06 PM

## 2024-12-19 NOTE — Plan of Care (Signed)
  Problem: Education: Goal: Ability to describe self-care measures that may prevent or decrease complications (Diabetes Survival Skills Education) will improve Outcome: Progressing Goal: Individualized Educational Video(s) Outcome: Progressing   Problem: Coping: Goal: Ability to adjust to condition or change in health will improve Outcome: Progressing   Problem: Fluid Volume: Goal: Ability to maintain a balanced intake and output will improve Outcome: Progressing   Problem: Health Behavior/Discharge Planning: Goal: Ability to identify and utilize available resources and services will improve Outcome: Progressing Goal: Ability to manage health-related needs will improve Outcome: Progressing   Problem: Metabolic: Goal: Ability to maintain appropriate glucose levels will improve Outcome: Progressing   Problem: Nutritional: Goal: Maintenance of adequate nutrition will improve Outcome: Progressing Goal: Progress toward achieving an optimal weight will improve Outcome: Progressing   Problem: Skin Integrity: Goal: Risk for impaired skin integrity will decrease Outcome: Progressing   Problem: Tissue Perfusion: Goal: Adequacy of tissue perfusion will improve Outcome: Progressing   Problem: Education: Goal: Knowledge of General Education information will improve Description: Including pain rating scale, medication(s)/side effects and non-pharmacologic comfort measures Outcome: Progressing   Problem: Health Behavior/Discharge Planning: Goal: Ability to manage health-related needs will improve Outcome: Progressing   Problem: Clinical Measurements: Goal: Ability to maintain clinical measurements within normal limits will improve Outcome: Progressing Goal: Will remain free from infection Outcome: Progressing Goal: Diagnostic test results will improve Outcome: Progressing Goal: Respiratory complications will improve Outcome: Progressing Goal: Cardiovascular complication will  be avoided Outcome: Progressing   Problem: Nutrition: Goal: Adequate nutrition will be maintained Outcome: Progressing   Problem: Coping: Goal: Level of anxiety will decrease Outcome: Progressing

## 2024-12-20 ENCOUNTER — Other Ambulatory Visit (HOSPITAL_COMMUNITY): Payer: Self-pay

## 2024-12-20 DIAGNOSIS — M86172 Other acute osteomyelitis, left ankle and foot: Secondary | ICD-10-CM | POA: Diagnosis not present

## 2024-12-20 DIAGNOSIS — E1121 Type 2 diabetes mellitus with diabetic nephropathy: Secondary | ICD-10-CM | POA: Diagnosis not present

## 2024-12-20 DIAGNOSIS — S91202A Unspecified open wound of left great toe with damage to nail, initial encounter: Secondary | ICD-10-CM | POA: Diagnosis not present

## 2024-12-20 DIAGNOSIS — I2510 Atherosclerotic heart disease of native coronary artery without angina pectoris: Secondary | ICD-10-CM | POA: Diagnosis not present

## 2024-12-20 DIAGNOSIS — E785 Hyperlipidemia, unspecified: Secondary | ICD-10-CM | POA: Diagnosis not present

## 2024-12-20 DIAGNOSIS — E46 Unspecified protein-calorie malnutrition: Secondary | ICD-10-CM

## 2024-12-20 DIAGNOSIS — E8722 Chronic metabolic acidosis: Secondary | ICD-10-CM | POA: Diagnosis not present

## 2024-12-20 DIAGNOSIS — E1122 Type 2 diabetes mellitus with diabetic chronic kidney disease: Secondary | ICD-10-CM | POA: Diagnosis not present

## 2024-12-20 DIAGNOSIS — K219 Gastro-esophageal reflux disease without esophagitis: Secondary | ICD-10-CM | POA: Diagnosis not present

## 2024-12-20 DIAGNOSIS — N1832 Chronic kidney disease, stage 3b: Secondary | ICD-10-CM | POA: Diagnosis not present

## 2024-12-20 DIAGNOSIS — J449 Chronic obstructive pulmonary disease, unspecified: Secondary | ICD-10-CM | POA: Diagnosis not present

## 2024-12-20 DIAGNOSIS — I129 Hypertensive chronic kidney disease with stage 1 through stage 4 chronic kidney disease, or unspecified chronic kidney disease: Secondary | ICD-10-CM | POA: Diagnosis not present

## 2024-12-20 LAB — CBC
HCT: 22.3 % — ABNORMAL LOW (ref 39.0–52.0)
Hemoglobin: 7.9 g/dL — ABNORMAL LOW (ref 13.0–17.0)
MCH: 26.2 pg (ref 26.0–34.0)
MCHC: 35.4 g/dL (ref 30.0–36.0)
MCV: 74.1 fL — ABNORMAL LOW (ref 80.0–100.0)
Platelets: 150 10*3/uL (ref 150–400)
RBC: 3.01 MIL/uL — ABNORMAL LOW (ref 4.22–5.81)
RDW: 19.4 % — ABNORMAL HIGH (ref 11.5–15.5)
WBC: 15.9 10*3/uL — ABNORMAL HIGH (ref 4.0–10.5)
nRBC: 1.8 % — ABNORMAL HIGH (ref 0.0–0.2)

## 2024-12-20 LAB — BASIC METABOLIC PANEL WITH GFR
Anion gap: 10 (ref 5–15)
BUN: 20 mg/dL (ref 8–23)
CO2: 21 mmol/L — ABNORMAL LOW (ref 22–32)
Calcium: 9 mg/dL (ref 8.9–10.3)
Chloride: 105 mmol/L (ref 98–111)
Creatinine, Ser: 1.55 mg/dL — ABNORMAL HIGH (ref 0.61–1.24)
GFR, Estimated: 49 mL/min — ABNORMAL LOW
Glucose, Bld: 188 mg/dL — ABNORMAL HIGH (ref 70–99)
Potassium: 3.7 mmol/L (ref 3.5–5.1)
Sodium: 136 mmol/L (ref 135–145)

## 2024-12-20 LAB — GLUCOSE, CAPILLARY
Glucose-Capillary: 169 mg/dL — ABNORMAL HIGH (ref 70–99)
Glucose-Capillary: 262 mg/dL — ABNORMAL HIGH (ref 70–99)

## 2024-12-20 LAB — MAGNESIUM: Magnesium: 1.9 mg/dL (ref 1.7–2.4)

## 2024-12-20 MED ORDER — CIPROFLOXACIN HCL 500 MG PO TABS
500.0000 mg | ORAL_TABLET | Freq: Two times a day (BID) | ORAL | 0 refills | Status: AC
  Filled 2024-12-20: qty 60, 30d supply, fill #0

## 2024-12-20 MED ORDER — HYDROMORPHONE HCL 2 MG PO TABS
2.0000 mg | ORAL_TABLET | Freq: Four times a day (QID) | ORAL | 0 refills | Status: AC | PRN
  Filled 2024-12-20: qty 12, 3d supply, fill #0

## 2024-12-20 MED ORDER — GABAPENTIN 300 MG PO CAPS
300.0000 mg | ORAL_CAPSULE | Freq: Three times a day (TID) | ORAL | 0 refills | Status: AC | PRN
  Filled 2024-12-20: qty 90, 30d supply, fill #0

## 2024-12-20 MED ORDER — DOXYCYCLINE HYCLATE 100 MG PO TABS
100.0000 mg | ORAL_TABLET | Freq: Two times a day (BID) | ORAL | 0 refills | Status: AC
  Filled 2024-12-20: qty 60, 30d supply, fill #0

## 2024-12-20 MED ORDER — HEPARIN SOD (PORK) LOCK FLUSH 100 UNIT/ML IV SOLN
500.0000 [IU] | INTRAVENOUS | Status: AC | PRN
  Administered 2024-12-20: 500 [IU]

## 2024-12-20 NOTE — Plan of Care (Signed)
  Problem: Education: Goal: Ability to describe self-care measures that may prevent or decrease complications (Diabetes Survival Skills Education) will improve Outcome: Progressing Goal: Individualized Educational Video(s) Outcome: Progressing   Problem: Coping: Goal: Ability to adjust to condition or change in health will improve Outcome: Progressing   Problem: Fluid Volume: Goal: Ability to maintain a balanced intake and output will improve Outcome: Progressing   Problem: Health Behavior/Discharge Planning: Goal: Ability to identify and utilize available resources and services will improve Outcome: Progressing Goal: Ability to manage health-related needs will improve Outcome: Progressing   Problem: Metabolic: Goal: Ability to maintain appropriate glucose levels will improve Outcome: Progressing   Problem: Nutritional: Goal: Maintenance of adequate nutrition will improve Outcome: Progressing Goal: Progress toward achieving an optimal weight will improve Outcome: Progressing   Problem: Skin Integrity: Goal: Risk for impaired skin integrity will decrease Outcome: Progressing   Problem: Tissue Perfusion: Goal: Adequacy of tissue perfusion will improve Outcome: Progressing   Problem: Education: Goal: Knowledge of General Education information will improve Description: Including pain rating scale, medication(s)/side effects and non-pharmacologic comfort measures Outcome: Progressing   Problem: Clinical Measurements: Goal: Ability to maintain clinical measurements within normal limits will improve Outcome: Progressing Goal: Will remain free from infection Outcome: Progressing Goal: Diagnostic test results will improve Outcome: Progressing Goal: Respiratory complications will improve Outcome: Progressing Goal: Cardiovascular complication will be avoided Outcome: Progressing   Problem: Activity: Goal: Risk for activity intolerance will decrease Outcome: Progressing    Problem: Nutrition: Goal: Adequate nutrition will be maintained Outcome: Progressing   Problem: Coping: Goal: Level of anxiety will decrease Outcome: Progressing   Problem: Elimination: Goal: Will not experience complications related to bowel motility Outcome: Progressing Goal: Will not experience complications related to urinary retention Outcome: Progressing

## 2024-12-20 NOTE — Consult Note (Signed)
 Reason for Consult: Right hallux pain Referring Physician: Arvella Mon, MD  Jose [name] is an 64 y.o. male.  HPI: Jose [name] is a 64 y.o. male with a  history of pancreatic cancer (adenocarcinoma) currently being treated with chemotherapy (gemcitabine and paclitaxel), type 2 diabetes, hypertension, COPD, HLD, CAD, diabetic nephropathy with nodular glomerulosclerosis, CKD stage IIIb, and gout who presented with nausea, vomiting, and abdominal pain and was admitted for neutropenic fever and sepsis, now resolved, but with new concern for left Great toe osteomyelitis. I was asked to see this patient by Dr. Mon due to concern for right hallux osteomyelitis.  Patient states that he noted that his toenail was elevating and then ultimately fell off.  He has pain in his hallux.  X-rays were performed by admitting provider and did not demonstrate any bony destruction or injury.  MRI scan subsequently was ordered which demonstrated bony edema within the distal phalanx.  Radiologist voiced concern for osteomyelitis.  Patient does complain of pain in the toe.  After questioning the patient reports having hit the toe several times on his wheelchair.  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 Reported MI in 2012 s/p 2 stents;  b. 08/2012 Cath: LM 20, LAD 20 diff ISR, jailed septal - 99%, LCX 30ost, RI large, min irregs, RCA 30p, 20-30 ISR-->Med Rx; c. 2017 [facility]   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   Gangrene of right foot (HCC) 04/24/2023   Hyperlipidemia    Hypertension    Hypertensive urgency 02/11/2021   Metabolic acidosis 05/11/2023   Noncompliance     Pancreatic cancer (HCC)    Peripheral arterial disease     Past Surgical History:  Procedure Laterality Date   ABDOMINAL AORTOGRAM W/LOWER EXTREMITY N/A 04/27/2023   Procedure: ABDOMINAL AORTOGRAM W/LOWER EXTREMITY;  Surgeon: Sheree Penne Bruckner, MD;  Location: [facility] INVASIVE CV LAB;  Service: Cardiovascular;  Laterality: N/A;   AMPUTATION Right 05/30/2023   Procedure: AMPUTATION BELOW KNEE;  Surgeon: Sheree Penne Bruckner, MD;  Location: [facility] OR;  Service: Vascular;  Laterality: Right;   AMPUTATION Right 07/01/2023   Procedure: RIGHT ABOVE KNEE AMPUTATION;  Surgeon: Magda Debby SAILOR, MD;  Location: [facility] OR;  Service: Vascular;  Laterality: Right;   APPLICATION OF WOUND VAC Right 07/01/2023   Procedure: APPLICATION OF WOUND VAC;  Surgeon: Magda Debby SAILOR, MD;  Location: [facility] OR;  Service: Vascular;  Laterality: Right;   CARDIAC CATHETERIZATION     CORONARY STENT PLACEMENT     FRACTURE SURGERY Right    hardware from right leg fracture   LEFT HEART CATH AND CORONARY ANGIOGRAPHY N/A 08/22/2020   Procedure: LEFT HEART CATH AND CORONARY ANGIOGRAPHY;  Surgeon: Claudene Victory ORN, MD;  Location: [facility] INVASIVE CV LAB;  Service: Cardiovascular;  Laterality: N/A;   LEFT HEART CATH AND CORONARY ANGIOGRAPHY N/A 01/28/2022   Procedure: LEFT HEART CATH AND CORONARY ANGIOGRAPHY;  Surgeon: Burnard Debby LABOR, MD;  Location: [facility] INVASIVE CV LAB;  Service: Cardiovascular;  Laterality: N/A;   LEFT HEART CATHETERIZATION WITH CORONARY ANGIOGRAM Bilateral 08/25/2012   Procedure: LEFT HEART CATHETERIZATION WITH CORONARY ANGIOGRAM;  Surgeon: Lonni JONETTA Cash, MD;  Location: [facility] CATH LAB;  Service: Cardiovascular;  Laterality: Bilateral;   PERIPHERAL INTRAVASCULAR LITHOTRIPSY  04/27/2023   Procedure: PERIPHERAL INTRAVASCULAR LITHOTRIPSY;  Surgeon: Sheree Penne Lonni, MD;  Location: [facility] INVASIVE CV LAB;  Service: Cardiovascular;;   PERIPHERAL VASCULAR BALLOON ANGIOPLASTY  04/27/2023   Procedure: PERIPHERAL VASCULAR  BALLOON ANGIOPLASTY;  Surgeon: Sheree Penne Lonni, MD;  Location: [facility] INVASIVE CV LAB;  Service: Cardiovascular;;   TRANSMETATARSAL AMPUTATION Right 05/12/2023   Procedure: TRANSMETATARSAL AMPUTATION;  Surgeon: Eliza Lonni RAMAN, MD;  Location: [facility] OR;  Service: Vascular;  Laterality: Right;    Family History  Problem Relation Age of Onset   Heart disease Mother        MI 64s    Social History:  reports that he quit smoking about 17 years ago. His smoking use included cigarettes. He has never used smokeless tobacco. He reports that he does not currently use alcohol . He reports current drug use. Drug: Marijuana.  Allergies: Allergies[1]  Medications: I have reviewed the patient's current medications.  Results for orders placed or performed during the hospital encounter of 12/13/24 (from the past 48 hours)  Glucose, capillary     Status: Abnormal   Collection Time: 12/18/24 11:32 AM  Result Value Ref Range   Glucose-Capillary 383 (H) 70 - 99 mg/dL    Comment: Glucose reference range applies only to samples taken after fasting for at least 8 hours.  Glucose, capillary     Status: Abnormal   Collection Time: 12/18/24  3:04 PM  Result Value Ref Range   Glucose-Capillary 365 (H) 70 - 99 mg/dL    Comment: Glucose reference range applies only to samples taken after fasting for at least 8 hours.  Glucose, capillary     Status: Abnormal   Collection Time: 12/18/24  8:17 PM  Result Value Ref Range   Glucose-Capillary 272 (H) 70 - 99 mg/dL    Comment: Glucose reference range applies only to samples taken after fasting for at least 8 hours.  Glucose, capillary     Status: Abnormal   Collection Time: 12/19/24 12:03 AM  Result Value Ref Range   Glucose-Capillary 283 (H) 70 - 99 mg/dL    Comment: Glucose reference range applies only to samples taken after fasting for at least 8 hours.  Glucose, capillary     Status: Abnormal   Collection Time: 12/19/24  4:26 AM  Result Value Ref  Range   Glucose-Capillary 241 (H) 70 - 99 mg/dL    Comment: Glucose reference range applies only to samples taken after fasting for at least 8 hours.  Basic metabolic panel     Status: Abnormal   Collection Time: 12/19/24  4:50 AM  Result Value Ref Range   Sodium 136 135 - 145 mmol/L   Potassium 3.4 (L) 3.5 - 5.1 mmol/L   Chloride 106 98 - 111 mmol/L   CO2 20 (L) 22 - 32 mmol/L   Glucose, Bld 256 (H) 70 - 99 mg/dL    Comment: Glucose reference range applies only to samples taken after fasting for at least 8 hours.   BUN 18 8 - 23 mg/dL   Creatinine, Ser 8.38 (H) 0.61 - 1.24 mg/dL   Calcium  8.9 8.9 - 10.3 mg/dL   GFR, Estimated 47 (L) >60 mL/min    Comment: (NOTE) Calculated using the CKD-EPI Creatinine Equation (2021)    Anion gap 10 5 - 15    Comment: Performed at [facility] Lab, [address] [place], [place] [address]  Magnesium      Status: Abnormal   Collection Time: 12/19/24  4:50 AM  Result Value Ref Range   Magnesium  1.6 (L) 1.7 - 2.4 mg/dL    Comment: Performed at [facility] Lab, [address], [place], [place] [address]  CBC with Differential/Platelet     Status: Abnormal   Collection Time: 12/19/24  4:50 AM  Result Value Ref Range   WBC 20.5 (H) 4.0 - 10.5 K/uL   RBC 2.94 (L) 4.22 - 5.81 MIL/uL   Hemoglobin 7.6 (L) 13.0 - 17.0 g/dL    Comment: Reticulocyte Hemoglobin testing may be clinically indicated, consider ordering this additional test [id number]    HCT 21.4 (L) 39.0 - 52.0 %   MCV 72.8 (L) 80.0 - 100.0 fL   MCH 25.9 (L) 26.0 - 34.0 pg   MCHC 35.5 30.0 - 36.0 g/dL   RDW 80.6 (H) 88.4 - 84.4 %   Platelets 122 (L) 150 - 400 K/uL    Comment: REPEATED TO VERIFY   nRBC 1.5 (H) 0.0 - 0.2 %   Neutrophils Relative % 87 %   Neutro Abs 17.8 (H) 1.7 - 7.7 K/uL   Lymphocytes Relative 6 %   Lymphs Abs 1.2 0.7 - 4.0 K/uL   Monocytes Relative 6 %   Monocytes Absolute 1.2 (H) 0.1 - 1.0 K/uL   Eosinophils Relative 1 %   Eosinophils Absolute 0.2 0.0 - 0.5  K/uL   Basophils Relative 0 %   Basophils Absolute 0.0 0.0 - 0.1 K/uL   WBC Morphology See Note     Comment:  Mild Left Shift. 1 to 5% Metas, occ myelo   Smear Review See Note     Comment:  Normal Platelet Morphology   Polychromasia PRESENT     Comment: Performed at [facility] Lab, [address], [place], [place] [address]  Glucose, capillary     Status: Abnormal   Collection Time: 12/19/24  8:42 AM  Result Value Ref Range   Glucose-Capillary 339 (H) 70 - 99 mg/dL    Comment: Glucose reference range applies only to samples taken after fasting for at least 8 hours.  Glucose, capillary     Status: Abnormal   Collection Time: 12/19/24 12:08 PM  Result Value Ref Range   Glucose-Capillary 326 (H) 70 - 99 mg/dL    Comment: Glucose reference range applies only to samples taken after fasting for at least 8 hours.  Glucose, capillary     Status: Abnormal   Collection Time: 12/19/24  4:31 PM  Result Value Ref Range   Glucose-Capillary 274 (H) 70 - 99 mg/dL    Comment: Glucose reference range applies only to samples taken after fasting for at least 8 hours.  Glucose, capillary     Status: Abnormal   Collection Time: 12/19/24  9:04 PM  Result Value Ref Range   Glucose-Capillary 185 (H) 70 - 99 mg/dL    Comment: Glucose reference range applies only to samples taken after fasting for at least 8 hours.  CBC     Status: Abnormal   Collection Time: 12/20/24  3:55 AM  Result Value Ref Range   WBC 15.9 (H) 4.0 - 10.5 K/uL   RBC 3.01 (L) 4.22 - 5.81 MIL/uL   Hemoglobin 7.9 (L) 13.0 - 17.0 g/dL    Comment: Reticulocyte Hemoglobin testing may be clinically indicated, consider ordering this additional test [id number]    HCT 22.3 (L) 39.0 - 52.0 %  MCV 74.1 (L) 80.0 - 100.0 fL   MCH 26.2 26.0 - 34.0 pg   MCHC 35.4 30.0 - 36.0 g/dL   RDW 80.5 (H) 88.4 - 84.4 %   Platelets 150 150 - 400 K/uL    Comment: REPEATED TO VERIFY   nRBC 1.8 (H) 0.0 - 0.2 %    Comment: Performed at [facility], [address] [place], [place] [address]  Basic metabolic panel     Status: Abnormal   Collection Time: 12/20/24  3:55 AM  Result Value Ref Range   Sodium 136 135 - 145 mmol/L   Potassium 3.7 3.5 - 5.1 mmol/L   Chloride 105 98 - 111 mmol/L   CO2 21 (L) 22 - 32 mmol/L   Glucose, Bld 188 (H) 70 - 99 mg/dL    Comment: Glucose reference range applies only to samples taken after fasting for at least 8 hours.   BUN 20 8 - 23 mg/dL   Creatinine, Ser 8.44 (H) 0.61 - 1.24 mg/dL   Calcium  9.0 8.9 - 10.3 mg/dL   GFR, Estimated 49 (L) >60 mL/min    Comment: (NOTE) Calculated using the CKD-EPI Creatinine Equation (2021)    Anion gap 10 5 - 15    Comment: Performed at [facility] Lab, [address], [place], [place] [address]  Magnesium      Status: None   Collection Time: 12/20/24  3:55 AM  Result Value Ref Range   Magnesium  1.9 1.7 - 2.4 mg/dL    Comment: Performed at [facility] Lab, [address], [place], [place] [address]  Glucose, capillary     Status: Abnormal   Collection Time: 12/20/24  6:17 AM  Result Value Ref Range   Glucose-Capillary 169 (H) 70 - 99 mg/dL    Comment: Glucose reference range applies only to samples taken after fasting for at least 8 hours.    No results found.  Review of Systems  Constitutional: Negative.   HENT: Negative.    Respiratory: Negative.    Musculoskeletal:        Right great toe pain  Skin:        Right hallux nail avulsion  Neurological: Negative.   Psychiatric/Behavioral: Negative.     Blood pressure 114/65, pulse 91, temperature 98.6 F (37 C), temperature source Oral, resp. rate 17, height 5' 11 (1.803 m), weight 61.9 kg, SpO2 98%. Physical Exam HENT:     Head: Atraumatic.     Mouth/Throat:     Mouth: Mucous membranes are dry.  Eyes:     Extraocular Movements: Extraocular movements intact.  Cardiovascular:     Rate and Rhythm: Normal rate.  Pulmonary:     Effort: Pulmonary effort is normal.  Abdominal:      Palpations: Abdomen is soft.  Musculoskeletal:     Cervical back: Neck supple.     Comments: Right hallux tenderness to palpation distally.  There has been nail removal.  No drainage.  No foul odor.  Minimal swelling.  No pain with range of motion of the interphalangeal joint or hallux IP joint.  Active motion intact.  No significant foot swelling or sign of cellulitis.  Skin:    General: Skin is warm.  Neurological:     Mental Status: He is alert.  Psychiatric:        Mood and Affect: Mood normal.     Assessment/Plan: Patient has an issue with his right great toe.  MRI scan showed some bony  edema and radiology was concern for osteomyelitis.  The patient did have a nail injury and the nail has fallen off.  Currently on exam his toe does not show significant swelling but he does have tenderness to palpation.  Unfortunately it is difficult to tell the difference between distal phalanx injury/fracture versus osteomyelitis.  I would favor injury given his history of bumping the toe several times on his wheelchair.  Currently the toe does not look grossly infected or in need of emergent amputation.  I would be fine monitoring this as an outpatient.  If his symptoms worsen he may in fact require amputation however there is a chance that he will improve without the need for surgery.  I would not put any wrapping on the toe or any creams or lotions.  The patient will monitor for improvement versus worsening of his pain symptoms which will be our clinical indicator of the need for surgery in the future. Currently there is no drainage from the toe, no open wounds or foul odor or definitive indications of infection.  Patient is okay to have a diet today.  Okay for discharge from orthopedic standpoint.  He may weight-bear as tolerated.  He may benefit from a postoperative shoe if the toe is too uncomfortable for regular shoewear.   Lonni JONELLE Pae 12/20/2024, 8:33 AM         [1]  Allergies Allergen  Reactions   Lisinopril -Hydrochlorothiazide  Swelling and Other (See Comments)    Causes swelling of lips   Simvastatin Other (See Comments)    ALT (SGPT) level raised, Aspartate aminotransferase serum level raised

## 2024-12-20 NOTE — TOC Transition Note (Signed)
 Transition of Care [facility]) - Discharge Note   Patient Details  Name: Jose [name] MRN: [id number] Date of Birth: 04-07-1961  Transition of [facility] CM/SW Contact:  Roxie KANDICE Stain, RN Phone Number: 12/20/2024, 12:27 PM   Clinical Narrative:     Spoke to patient regarding transition needs. Patient is agreeable to home health. This RNCM offered choice for Home Health, [name] states he has no preference, RNCM made referral to [facility] with [facility], He is able to take referral.  Patient states he has an aide through [facility] through the [place]. Has all needed DME. [facility] has been called for transportation. Follow-up appointments on AVS.   Final next level of care: Home w Home Health Services Barriers to Discharge: Barriers Resolved   Patient Goals and CMS Choice Patient states their goals for this hospitalization and ongoing recovery are:: Get better CMS Medicare.gov Compare Post Acute Care list provided to:: Patient Choice offered to / list presented to : Patient      Discharge Placement             Home          Discharge Plan and Services Additional resources added to the After Visit Summary for                            [facility] Arranged: PT, OT, RN [facility] Agency: [facility] Date [facility] Agency Contacted: 12/20/24 Time HH Agency Contacted: 1227 Representative spoke with at [facility] Agency: Darleene  Social Drivers of Health (SDOH) Interventions SDOH Screenings   Food Insecurity: No Food Insecurity (12/13/2024)  Housing: Low Risk (12/13/2024)  Transportation Needs: No Transportation Needs (12/13/2024)  Utilities: Not At Risk (12/13/2024)  Depression (PHQ2-9): High Risk (08/11/2022)  Social Connections: Unknown (12/13/2024)  Tobacco Use: Medium Risk (12/13/2024)     Readmission Risk Interventions    12/20/2024   12:27 PM 05/29/2023    2:21 PM 04/29/2023    1:09 PM  Readmission Risk Prevention Plan  Transportation Screening Complete Complete Complete   PCP or Specialist Appt within 3-5 Days Complete    HRI or Home Care Consult Complete    Social Work Consult for Recovery Care Planning/Counseling Complete    Palliative Care Screening Not Applicable    Medication Review [employer]) Complete Complete Complete  PCP or Specialist appointment within 3-5 days of discharge  Complete   HRI or Home Care Consult  Complete Complete  SW Recovery Care/Counseling Consult  Complete Complete  Palliative Care Screening  Not Applicable Not Applicable  Skilled Nursing Facility  Complete Not Applicable

## 2024-12-27 ENCOUNTER — Observation Stay (HOSPITAL_COMMUNITY)
Admission: EM | Admit: 2024-12-27 | Discharge: 2024-12-30 | Disposition: A | Source: Home / Self Care | Attending: Internal Medicine | Admitting: Internal Medicine

## 2024-12-27 ENCOUNTER — Encounter (HOSPITAL_COMMUNITY): Payer: Self-pay | Admitting: Internal Medicine

## 2024-12-27 ENCOUNTER — Emergency Department (HOSPITAL_COMMUNITY)

## 2024-12-27 ENCOUNTER — Other Ambulatory Visit: Payer: Self-pay

## 2024-12-27 DIAGNOSIS — I2510 Atherosclerotic heart disease of native coronary artery without angina pectoris: Secondary | ICD-10-CM | POA: Diagnosis present

## 2024-12-27 DIAGNOSIS — R7989 Other specified abnormal findings of blood chemistry: Secondary | ICD-10-CM

## 2024-12-27 DIAGNOSIS — K5900 Constipation, unspecified: Secondary | ICD-10-CM | POA: Diagnosis present

## 2024-12-27 DIAGNOSIS — R109 Unspecified abdominal pain: Secondary | ICD-10-CM | POA: Diagnosis present

## 2024-12-27 DIAGNOSIS — N179 Acute kidney failure, unspecified: Principal | ICD-10-CM

## 2024-12-27 LAB — COMPREHENSIVE METABOLIC PANEL WITH GFR
ALT: 59 U/L — ABNORMAL HIGH (ref 0–44)
AST: 58 U/L — ABNORMAL HIGH (ref 15–41)
Albumin: 3.2 g/dL — ABNORMAL LOW (ref 3.5–5.0)
Alkaline Phosphatase: 721 U/L — ABNORMAL HIGH (ref 38–126)
Anion gap: 12 (ref 5–15)
BUN: 31 mg/dL — ABNORMAL HIGH (ref 8–23)
CO2: 23 mmol/L (ref 22–32)
Calcium: 9.2 mg/dL (ref 8.9–10.3)
Chloride: 104 mmol/L (ref 98–111)
Creatinine, Ser: 2.13 mg/dL — ABNORMAL HIGH (ref 0.61–1.24)
GFR, Estimated: 34 mL/min — ABNORMAL LOW
Glucose, Bld: 334 mg/dL — ABNORMAL HIGH (ref 70–99)
Potassium: 4.1 mmol/L (ref 3.5–5.1)
Sodium: 138 mmol/L (ref 135–145)
Total Bilirubin: 0.4 mg/dL (ref 0.0–1.2)
Total Protein: 6.2 g/dL — ABNORMAL LOW (ref 6.5–8.1)

## 2024-12-27 LAB — CBC
HCT: 24.7 % — ABNORMAL LOW (ref 39.0–52.0)
Hemoglobin: 8.2 g/dL — ABNORMAL LOW (ref 13.0–17.0)
MCH: 25.9 pg — ABNORMAL LOW (ref 26.0–34.0)
MCHC: 33.2 g/dL (ref 30.0–36.0)
MCV: 77.9 fL — ABNORMAL LOW (ref 80.0–100.0)
Platelets: 668 10*3/uL — ABNORMAL HIGH (ref 150–400)
RBC: 3.17 MIL/uL — ABNORMAL LOW (ref 4.22–5.81)
RDW: 21.2 % — ABNORMAL HIGH (ref 11.5–15.5)
WBC: 10.3 10*3/uL (ref 4.0–10.5)
nRBC: 0.2 % (ref 0.0–0.2)

## 2024-12-27 LAB — PRO BRAIN NATRIURETIC PEPTIDE: Pro Brain Natriuretic Peptide: 756 pg/mL — ABNORMAL HIGH

## 2024-12-27 LAB — URINALYSIS, ROUTINE W REFLEX MICROSCOPIC
Bacteria, UA: NONE SEEN
Bilirubin Urine: NEGATIVE
Glucose, UA: 50 mg/dL — AB
Ketones, ur: NEGATIVE mg/dL
Leukocytes,Ua: NEGATIVE
Nitrite: NEGATIVE
Protein, ur: NEGATIVE mg/dL
Specific Gravity, Urine: 1.019 (ref 1.005–1.030)
pH: 6 (ref 5.0–8.0)

## 2024-12-27 LAB — TROPONIN T, HIGH SENSITIVITY
Troponin T High Sensitivity: 51 ng/L — ABNORMAL HIGH (ref 0–19)
Troponin T High Sensitivity: 48 ng/L — ABNORMAL HIGH (ref 0–19)

## 2024-12-27 LAB — LIPASE, BLOOD: Lipase: <10 U/L — ABNORMAL LOW (ref 11–51)

## 2024-12-27 LAB — GLUCOSE, CAPILLARY: Glucose-Capillary: 200 mg/dL — ABNORMAL HIGH (ref 70–99)

## 2024-12-27 MED ORDER — NALOXONE HCL 0.4 MG/ML IJ SOLN: 0.4000 mg | INTRAMUSCULAR | Status: DC | PRN

## 2024-12-27 MED ORDER — ONDANSETRON HCL 4 MG/2ML IJ SOLN
4.0000 mg | Freq: Once | INTRAMUSCULAR | Status: AC
  Administered 2024-12-27: 4 mg via INTRAVENOUS
  Filled 2024-12-27: qty 2

## 2024-12-27 MED ORDER — ALBUTEROL SULFATE (2.5 MG/3ML) 0.083% IN NEBU: 3.0000 mL | INHALATION_SOLUTION | Freq: Four times a day (QID) | RESPIRATORY_TRACT | Status: DC | PRN

## 2024-12-27 MED ORDER — OXYCODONE HCL 5 MG PO TABS
5.0000 mg | ORAL_TABLET | Freq: Four times a day (QID) | ORAL | Status: DC | PRN
  Administered 2024-12-28 – 2024-12-29 (×5): 10 mg via ORAL
  Filled 2024-12-27 (×6): qty 2

## 2024-12-27 MED ORDER — FONDAPARINUX SODIUM 2.5 MG/0.5ML ~~LOC~~ SOLN
2.5000 mg | SUBCUTANEOUS | Status: DC
  Administered 2024-12-27: 2.5 mg via SUBCUTANEOUS
  Filled 2024-12-27: qty 0.5

## 2024-12-27 MED ORDER — GABAPENTIN 300 MG PO CAPS
300.0000 mg | ORAL_CAPSULE | Freq: Three times a day (TID) | ORAL | Status: DC | PRN
  Administered 2024-12-28: 300 mg via ORAL
  Filled 2024-12-27: qty 1

## 2024-12-27 MED ORDER — FLUTICASONE FUROATE-VILANTEROL 200-25 MCG/ACT IN AEPB
1.0000 | INHALATION_SPRAY | Freq: Every day | RESPIRATORY_TRACT | Status: DC
  Administered 2024-12-28: 1 via RESPIRATORY_TRACT
  Filled 2024-12-27: qty 28

## 2024-12-27 MED ORDER — PANTOPRAZOLE SODIUM 40 MG PO TBEC
40.0000 mg | DELAYED_RELEASE_TABLET | Freq: Every day | ORAL | Status: DC
  Administered 2024-12-28 – 2024-12-30 (×3): 40 mg via ORAL
  Filled 2024-12-27 (×3): qty 1

## 2024-12-27 MED ORDER — CARVEDILOL 3.125 MG PO TABS
6.2500 mg | ORAL_TABLET | Freq: Two times a day (BID) | ORAL | Status: DC
  Administered 2024-12-28 – 2024-12-30 (×5): 6.25 mg via ORAL
  Filled 2024-12-27 (×5): qty 2

## 2024-12-27 MED ORDER — ASPIRIN 81 MG PO TBEC
81.0000 mg | DELAYED_RELEASE_TABLET | Freq: Every day | ORAL | Status: DC
  Administered 2024-12-28 – 2024-12-30 (×3): 81 mg via ORAL
  Filled 2024-12-27 (×3): qty 1

## 2024-12-27 MED ORDER — SENNOSIDES-DOCUSATE SODIUM 8.6-50 MG PO TABS: 1.0000 | ORAL_TABLET | Freq: Two times a day (BID) | ORAL | Status: DC

## 2024-12-27 MED ORDER — INSULIN ASPART 100 UNIT/ML IJ SOLN
0.0000 [IU] | Freq: Three times a day (TID) | INTRAMUSCULAR | Status: DC
  Administered 2024-12-28 – 2024-12-29 (×3): 2 [IU] via SUBCUTANEOUS
  Administered 2024-12-29: 1 [IU] via SUBCUTANEOUS
  Administered 2024-12-30: 2 [IU] via SUBCUTANEOUS
  Administered 2024-12-30: 1 [IU] via SUBCUTANEOUS
  Filled 2024-12-27: qty 2
  Filled 2024-12-27: qty 1
  Filled 2024-12-27 (×2): qty 2
  Filled 2024-12-27: qty 1
  Filled 2024-12-27: qty 2

## 2024-12-27 MED ORDER — MORPHINE SULFATE (PF) 2 MG/ML IV SOLN
2.0000 mg | Freq: Once | INTRAVENOUS | Status: AC
  Administered 2024-12-27: 2 mg via INTRAVENOUS
  Filled 2024-12-27: qty 1

## 2024-12-27 MED ORDER — SODIUM CHLORIDE 0.9% FLUSH: 10.0000 mL | INTRAVENOUS | Status: DC | PRN

## 2024-12-27 MED ORDER — HYDROMORPHONE HCL 1 MG/ML IJ SOLN
0.5000 mg | INTRAMUSCULAR | Status: DC | PRN
  Administered 2024-12-27 – 2024-12-29 (×8): 0.5 mg via INTRAVENOUS
  Filled 2024-12-27 (×8): qty 0.5

## 2024-12-27 MED ORDER — INSULIN ASPART 100 UNIT/ML IJ SOLN: 0.0000 [IU] | Freq: Every day | INTRAMUSCULAR | Status: DC

## 2024-12-27 MED ORDER — INSULIN GLARGINE-YFGN 100 UNIT/ML ~~LOC~~ SOLN
20.0000 [IU] | Freq: Every day | SUBCUTANEOUS | Status: DC
  Administered 2024-12-28 – 2024-12-30 (×3): 20 [IU] via SUBCUTANEOUS
  Filled 2024-12-27 (×3): qty 0.2

## 2024-12-27 MED ORDER — SODIUM CHLORIDE 0.9 % IV BOLUS (SEPSIS)
1000.0000 mL | Freq: Once | INTRAVENOUS | Status: AC
  Administered 2024-12-27: 1000 mL via INTRAVENOUS

## 2024-12-27 MED ORDER — CHLORHEXIDINE GLUCONATE CLOTH 2 % EX PADS
6.0000 | MEDICATED_PAD | Freq: Every day | CUTANEOUS | Status: DC
  Administered 2024-12-28 – 2024-12-29 (×2): 6 via TOPICAL

## 2024-12-27 MED ORDER — AMLODIPINE BESYLATE 5 MG PO TABS
10.0000 mg | ORAL_TABLET | Freq: Every day | ORAL | Status: DC
  Administered 2024-12-28 – 2024-12-30 (×3): 10 mg via ORAL
  Filled 2024-12-27 (×3): qty 2

## 2024-12-27 MED ORDER — POLYETHYLENE GLYCOL 3350 17 G PO PACK: 17.0000 g | PACK | Freq: Every day | ORAL | Status: DC

## 2024-12-27 MED ORDER — HYDROMORPHONE HCL 1 MG/ML IJ SOLN: 0.5000 mg | INTRAMUSCULAR | Status: DC | PRN

## 2024-12-27 MED ORDER — OLANZAPINE 5 MG PO TABS
2.5000 mg | ORAL_TABLET | Freq: Every day | ORAL | Status: DC
  Administered 2024-12-27 – 2024-12-29 (×3): 2.5 mg via ORAL
  Filled 2024-12-27 (×3): qty 1

## 2024-12-27 MED ORDER — ATORVASTATIN CALCIUM 40 MG PO TABS
80.0000 mg | ORAL_TABLET | Freq: Every evening | ORAL | Status: DC
  Administered 2024-12-27 – 2024-12-28 (×2): 80 mg via ORAL
  Filled 2024-12-27 (×2): qty 2

## 2024-12-27 MED ORDER — MORPHINE SULFATE (PF) 4 MG/ML IV SOLN
4.0000 mg | Freq: Once | INTRAVENOUS | Status: AC
  Administered 2024-12-27: 4 mg via INTRAVENOUS
  Filled 2024-12-27: qty 1

## 2024-12-27 MED ORDER — HYDROMORPHONE HCL 1 MG/ML IJ SOLN: 1.0000 mg | INTRAMUSCULAR | Status: DC | PRN

## 2024-12-27 MED ORDER — MIRTAZAPINE 7.5 MG PO TABS
15.0000 mg | ORAL_TABLET | Freq: Every day | ORAL | Status: DC
  Administered 2024-12-27 – 2024-12-29 (×3): 15 mg via ORAL
  Filled 2024-12-27: qty 1
  Filled 2024-12-27 (×2): qty 2
  Filled 2024-12-27: qty 1

## 2024-12-27 MED ORDER — CLOPIDOGREL BISULFATE 75 MG PO TABS
75.0000 mg | ORAL_TABLET | Freq: Every day | ORAL | Status: DC
  Administered 2024-12-28 – 2024-12-30 (×3): 75 mg via ORAL
  Filled 2024-12-27 (×3): qty 1

## 2024-12-27 MED ORDER — BISACODYL 10 MG RE SUPP: 10.0000 mg | Freq: Every day | RECTAL | Status: DC | PRN

## 2024-12-27 MED ORDER — LATANOPROST 0.005 % OP SOLN
1.0000 [drp] | Freq: Every day | OPHTHALMIC | Status: DC
  Administered 2024-12-28 – 2024-12-29 (×2): 1 [drp] via OPHTHALMIC
  Filled 2024-12-27: qty 2.5

## 2024-12-27 MED ORDER — DOXYCYCLINE HYCLATE 100 MG PO TABS
100.0000 mg | ORAL_TABLET | Freq: Two times a day (BID) | ORAL | Status: DC
  Administered 2024-12-27 – 2024-12-30 (×6): 100 mg via ORAL
  Filled 2024-12-27 (×6): qty 1

## 2024-12-27 MED ORDER — CIPROFLOXACIN HCL 500 MG PO TABS
500.0000 mg | ORAL_TABLET | Freq: Two times a day (BID) | ORAL | Status: DC
  Administered 2024-12-27 – 2024-12-30 (×6): 500 mg via ORAL
  Filled 2024-12-27 (×6): qty 1

## 2024-12-27 MED ORDER — SODIUM CHLORIDE 0.9 % IV SOLN: INTRAVENOUS | Status: AC

## 2024-12-27 MED ORDER — IOHEXOL 350 MG/ML SOLN
80.0000 mL | Freq: Once | INTRAVENOUS | Status: AC | PRN
  Administered 2024-12-27: 75 mL via INTRAVENOUS

## 2024-12-27 MED ORDER — SODIUM CHLORIDE 0.9 % IV SOLN: Freq: Once | INTRAVENOUS | Status: DC

## 2024-12-27 NOTE — ED Triage Notes (Signed)
 Pt BIB EMS due to constipations - last BM 4 days ago  Hx prostate cancer  - last chemo tx last week

## 2024-12-27 NOTE — ED Provider Notes (Signed)
 Shared PA visit.  Patient here with abdominal pain, constipation, poor p.o. intake.  Not feeling well and failure to thrive.  Last chemotherapy a week ago has been doing well since then.  He has history of pancreatic cancer.  He is not on any outpatient narcotic pain medicine per patient.  Overall he has felt nauseous has had pain he has not really been able to tolerate p.o. very well.  Feels constipated he feels dehydrated.  Extensive workup has been done already prior to my evaluation with the PA.  He has stable troponins.  I doubt cardiac process.  He had a PE scan that was negative.  Of note he does have an AKI with a creatinine of 2.13.  His blood sugar is elevated to 334.  CT scan does not show any acute processes.  Ongoing pancreatic cancer changes.  CT of the chest shows no pneumonia no volume overload no PE.  Ultimately is having a lot of discomfort.  He still nauseous.  I think he benefit from observation stay for pain control hydration and correction of his AKI and a better pain plan for home.  He lives by himself.  He has not really been able to tolerate p.o.  Will admit to medicine for further supportive care.  This chart was dictated using voice recognition software.  Despite best efforts to proofread,  errors can occur which can change the documentation meaning.    Ruthe Cornet, DO 12/27/24 2001

## 2024-12-28 ENCOUNTER — Observation Stay (HOSPITAL_COMMUNITY)

## 2024-12-28 LAB — COMPREHENSIVE METABOLIC PANEL WITH GFR
ALT: 54 U/L — ABNORMAL HIGH (ref 0–44)
AST: 45 U/L — ABNORMAL HIGH (ref 15–41)
Albumin: 3.1 g/dL — ABNORMAL LOW (ref 3.5–5.0)
Alkaline Phosphatase: 656 U/L — ABNORMAL HIGH (ref 38–126)
Anion gap: 10 (ref 5–15)
BUN: 25 mg/dL — ABNORMAL HIGH (ref 8–23)
CO2: 24 mmol/L (ref 22–32)
Calcium: 9 mg/dL (ref 8.9–10.3)
Chloride: 110 mmol/L (ref 98–111)
Creatinine, Ser: 1.93 mg/dL — ABNORMAL HIGH (ref 0.61–1.24)
GFR, Estimated: 38 mL/min — ABNORMAL LOW
Glucose, Bld: 249 mg/dL — ABNORMAL HIGH (ref 70–99)
Potassium: 4.2 mmol/L (ref 3.5–5.1)
Sodium: 144 mmol/L (ref 135–145)
Total Bilirubin: 0.4 mg/dL (ref 0.0–1.2)
Total Protein: 5.8 g/dL — ABNORMAL LOW (ref 6.5–8.1)

## 2024-12-28 LAB — CBC
HCT: 23.2 % — ABNORMAL LOW (ref 39.0–52.0)
Hemoglobin: 7.7 g/dL — ABNORMAL LOW (ref 13.0–17.0)
MCH: 25.9 pg — ABNORMAL LOW (ref 26.0–34.0)
MCHC: 33.2 g/dL (ref 30.0–36.0)
MCV: 78.1 fL — ABNORMAL LOW (ref 80.0–100.0)
Platelets: 622 10*3/uL — ABNORMAL HIGH (ref 150–400)
RBC: 2.97 MIL/uL — ABNORMAL LOW (ref 4.22–5.81)
RDW: 21.3 % — ABNORMAL HIGH (ref 11.5–15.5)
WBC: 7.8 10*3/uL (ref 4.0–10.5)
nRBC: 0 % (ref 0.0–0.2)

## 2024-12-28 LAB — GLUCOSE, CAPILLARY
Glucose-Capillary: 192 mg/dL — ABNORMAL HIGH (ref 70–99)
Glucose-Capillary: 192 mg/dL — ABNORMAL HIGH (ref 70–99)
Glucose-Capillary: 84 mg/dL (ref 70–99)
Glucose-Capillary: 165 mg/dL — ABNORMAL HIGH (ref 70–99)

## 2024-12-28 LAB — ECHOCARDIOGRAM COMPLETE
Height: 71 in
S' Lateral: 1.9 cm
Weight: 2201.07 [oz_av]

## 2024-12-28 MED ORDER — ONDANSETRON HCL 4 MG/2ML IJ SOLN
4.0000 mg | Freq: Four times a day (QID) | INTRAMUSCULAR | Status: DC | PRN
  Administered 2024-12-28 (×2): 4 mg via INTRAVENOUS
  Filled 2024-12-28 (×2): qty 2

## 2024-12-28 MED ORDER — SMOG ENEMA
300.0000 mL | Freq: Once | RECTAL | Status: DC
  Filled 2024-12-28: qty 960

## 2024-12-28 MED ORDER — ENSURE PLUS HIGH PROTEIN PO LIQD
237.0000 mL | Freq: Two times a day (BID) | ORAL | Status: DC
  Administered 2024-12-28 (×2): 237 mL via ORAL

## 2024-12-28 MED ORDER — POLYETHYLENE GLYCOL 3350 17 G PO PACK
17.0000 g | PACK | Freq: Two times a day (BID) | ORAL | Status: DC
  Administered 2024-12-28 – 2024-12-30 (×5): 17 g via ORAL
  Filled 2024-12-28 (×5): qty 1

## 2024-12-28 MED ORDER — ENOXAPARIN SODIUM 40 MG/0.4ML IJ SOSY
40.0000 mg | PREFILLED_SYRINGE | INTRAMUSCULAR | Status: DC
  Administered 2024-12-28 – 2024-12-29 (×2): 40 mg via SUBCUTANEOUS
  Filled 2024-12-28 (×2): qty 0.4

## 2024-12-28 MED ORDER — SITAGLIPTIN 100 MG PO TABS: 100.0000 mg | ORAL_TABLET | Freq: Every day | ORAL | Status: DC

## 2024-12-28 MED ORDER — SENNOSIDES-DOCUSATE SODIUM 8.6-50 MG PO TABS
3.0000 | ORAL_TABLET | Freq: Two times a day (BID) | ORAL | Status: DC
  Administered 2024-12-28 – 2024-12-30 (×5): 3 via ORAL
  Filled 2024-12-28 (×5): qty 3

## 2024-12-28 MED ORDER — LINAGLIPTIN 5 MG PO TABS
5.0000 mg | ORAL_TABLET | Freq: Every day | ORAL | Status: DC
  Administered 2024-12-28 – 2024-12-30 (×3): 5 mg via ORAL
  Filled 2024-12-28 (×3): qty 1

## 2024-12-28 MED ORDER — BISACODYL 10 MG RE SUPP
10.0000 mg | Freq: Every day | RECTAL | Status: AC
  Administered 2024-12-28 – 2024-12-29 (×2): 10 mg via RECTAL
  Filled 2024-12-28 (×2): qty 1

## 2024-12-28 MED ORDER — ENSURE PLUS HIGH PROTEIN PO LIQD
237.0000 mL | Freq: Three times a day (TID) | ORAL | Status: DC
  Administered 2024-12-28 – 2024-12-30 (×3): 237 mL via ORAL

## 2024-12-28 MED ORDER — SODIUM CHLORIDE 0.9 % IV SOLN: INTRAVENOUS | Status: AC

## 2024-12-28 NOTE — Plan of Care (Signed)
°  Problem: Education: °Goal: Knowledge of General Education information will improve °Description: Including pain rating scale, medication(s)/side effects and non-pharmacologic comfort measures °Outcome: Progressing °  °Problem: Activity: °Goal: Risk for activity intolerance will decrease °Outcome: Progressing °  °Problem: Elimination: °Goal: Will not experience complications related to urinary retention °Outcome: Progressing °  °

## 2024-12-28 NOTE — Care Management Obs Status (Addendum)
 MEDICARE OBSERVATION STATUS NOTIFICATION   Patient Details  Name: Jose [name] MRN: [id number] Date of Birth: October 23, 1961   Medicare Observation Status Notification Given:  Yes (letter given. Pagtient gives CM consent to sign for patient due to patient states that he is too sick to sign)    Toy LITTIE Agar, RN 12/28/2024, 4:04 PM

## 2024-12-28 NOTE — Progress Notes (Incomplete Revision)
 " PROGRESS NOTE    Jose [name]  FMW:[id number] DOB: November 26, 1960 DOA: 12/27/2024 PCP: [facility]   Brief Narrative: 64 year old male with a history of pancreatic adenocarcinoma currently on chemotherapy at [facility] oncologist Dr. Oneil Chihuahua on gemcitabine and paclitaxel, type 2 diabetes diabetic nephropathy with glomerulosclerosis stage IIIa CKD right AKA, depression gout CBD stenting CAD COPD hypertension hyperlipidemia PAD admitted with poor p.o. intake AKI and constipation.  He reports nausea and vomiting and has not had a bowel movement for 4 days prior to admission.  CT scan abdomen does not show any evidence of obstruction but showed fluid distended small bowel throughout the abdomen and small volume fecal loading throughout the colon.    He was admitted to [facility] January 20 through January 27 for acute encephalopathy neutropenic fever and sepsis and found to have left great toe osteomyelitis by MRI.  Ortho was consulted recommended oral antibiotics and outpatient follow-up with Dr Elsa.  During that hospital stay he had thrombocytopenia likely from chemo and Lovenox .  After stopping Lovenox  his count improved and he received fondaparinux  for DVT prophylaxis.  He He takes Dilaudid  at home on a regular basis and is mostly bedbound.  He lives alone.  A CT of the chest showed no evidence of PE.  CT abdomen and pelvis -No acute abnormality within the abdomen or pelvis. Decreased size and conspicuity of the pancreatic head and uncinate process mass, measuring approximately 1.4 x 1.3 cm, previously 2.5 x 2.0 cm. No evidence of metas burn tatic disease. Unchanged 3.4 cm multiseptated cystic lesion in the pancreatic tail with a single chunky calcification. Metallic biliary duct stent in the distal common bile duct with small volume pneumobilia. Nonobstructive right lower pole calculus. No hydronephrosis in either kidney.  Assessment & Plan:   Principal Problem:   AKI (acute kidney  injury) Active Problems:   CAD (coronary artery disease)   Constipation   Abdominal pain   Elevated brain natriuretic peptide (BNP) level   #1 Opioid-induced constipation in the setting of daily Dilaudid  use for cancer-related pain and pancreatic adenocarcinoma.  Patient has not had a BM for 4 days.  CT scan of the abdomen does not show evidence of obstruction but does show fluid-filled loops of small bowel and stool burden and large bowel. Start MiraLAX  twice daily Dulcolax suppository and oral Dulcolax Senna daily Smog enema if no response with above He should be on daily stool softeners/laxatives on discharge to prevent this from happening.  #2 AKI on CKD stage III A due to poor p.o. intake nausea and vomiting.  CT abdomen showed no evidence of obstructive uropathy or hydronephrosis.  Renal functions improving with IV fluids. Creatinine 1.93 from 2.13. Continue IV fluids and follow-up labs in a.m.  #3 elevated proBNP with history of CAD patient has no evidence of fluid overload in fact he looks more on the dry side.  This could be falsely elevated due to AKI.  Continue IV fluids at a slow rate.  Echo in 2023 EF 65 to 70%.  Follow-up echo was ordered on this admission pending. Continue aspirin  Plavix  beta-blocker and statin  #4 pancreatic adenocarcinoma followed by [facility] oncology Dr. Chihuahua.  Continue as needed Dilaudid  and Oxy.  #5 elevated alkaline phosphatase with mildly elevated AST and ALT with normal bilirubin follow-up in AM.  Levels are trending down.  #6 anemia of chronic disease/iron deficiency-no signs of active bleeding.  Hemoglobin 7.7 from 8.2 this admission.  Likely from hemodilution. Last iron  panel done in 2024 with iron of 8, TIBC 162 ferritin 1502. Will repeat an iron panel in AM.  #7 type 2 diabetes with hyperglycemia uncontrolled Last A1c was 7.8 from January 2026. Continue long-acting insulin  and SSI.  #8 recent left great toe osteomyelitis on  doxycycline  and ciprofloxacin  to finish the course.  He does have follow-up appointment with Ortho next week.  Continue antibiotics till seen by Ortho.  #9 essential hypertension on amlodipine  and carvedilol   #10 mood disorder on Zyprexa  and Remeron   #11 GERD on PPI  #12 DVT prophylaxis initially he was on fondaparinux  however with creatinine clearance of 30 this is contraindicated will try Lovenox  his HIT panel from last admission was negative.  Follow-up platelets.  Currently stable.  Fondaparinux  was stopped and Lovenox  was started.    Estimated body mass index is 19.19 kg/m as calculated from the following:   Height as of this encounter: 5' 11 (1.803 m).   Weight as of this encounter: 62.4 kg.  DVT prophylaxis: LOVENOX  Code Status: full Family Communication: none Disposition Plan:  Status is: Observation   Consultants:  none  Procedures:none Antimicrobials:  Anti-infectives (From admission, onward)    Start     Dose/Rate Route Frequency Ordered Stop   12/27/24 2200  ciprofloxacin  (CIPRO ) tablet 500 mg        500 mg Oral 2 times daily 12/27/24 2126     12/27/24 2200  doxycycline  (VIBRA -TABS) tablet 100 mg        100 mg Oral 2 times daily 12/27/24 2126          Subjective: C/o constipation no bm since admit Nauseous   Objective: Vitals:   12/27/24 2145 12/27/24 2149 12/28/24 0231 12/28/24 0617  BP: (!) 151/88  134/85 138/78  Pulse: (!) 103  94 88  Resp: 16  18 18   Temp: 98.4 F (36.9 C)  98.5 F (36.9 C) 98.2 F (36.8 C)  TempSrc: Oral  Oral Oral  SpO2: 100%  99% 98%  Weight:  62.4 kg    Height:  5' 11 (1.803 m)      Intake/Output Summary (Last 24 hours) at 12/28/2024 0736 Last data filed at 12/28/2024 0126 Gross per 24 hour  Intake 162.22 ml  Output --  Net 162.22 ml   Filed Weights   12/27/24 2149  Weight: 62.4 kg    Examination:  General exam: Appears in nad chronically ill appearing Respiratory system: Clear to auscultation. Respiratory  effort normal. Cardiovascular system: reg Gastrointestinal system: Abdomen is distended, soft and  generalized tender. Normal bowel sounds heard. Central nervous system: Alert and oriented. No focal neurological deficits. Extremities: r aka  Data Reviewed: I have personally reviewed following labs and imaging studies  CBC: Recent Labs  Lab 12/27/24 1606 12/28/24 0302  WBC 10.3 7.8  HGB 8.2* 7.7*  HCT 24.7* 23.2*  MCV 77.9* 78.1*  PLT 668* 622*   Basic Metabolic Panel: Recent Labs  Lab 12/27/24 1606 12/28/24 0302  NA 138 144  K 4.1 4.2  CL 104 110  CO2 23 24  GLUCOSE 334* 249*  BUN 31* 25*  CREATININE 2.13* 1.93*  CALCIUM  9.2 9.0   GFR: Estimated Creatinine Clearance: 33.7 mL/min (A) (by C-G formula based on SCr of 1.93 mg/dL (H)). Liver Function Tests: Recent Labs  Lab 12/27/24 1606 12/28/24 0302  AST 58* 45*  ALT 59* 54*  ALKPHOS 721* 656*  BILITOT 0.4 0.4  PROT 6.2* 5.8*  ALBUMIN 3.2* 3.1*   Recent Labs  Lab 12/27/24 1606  LIPASE <10*   No results for input(s): AMMONIA in the last 168 hours. Coagulation Profile: No results for input(s): INR, PROTIME in the last 168 hours. Cardiac Enzymes: No results for input(s): CKTOTAL, CKMB, CKMBINDEX, TROPONINI in the last 168 hours. BNP (last 3 results) Recent Labs    12/27/24 1606  PROBNP 756.0*   HbA1C: No results for input(s): HGBA1C in the last 72 hours. CBG: Recent Labs  Lab 12/27/24 2216  GLUCAP 200*   Lipid Profile: No results for input(s): CHOL, HDL, LDLCALC, TRIG, CHOLHDL, LDLDIRECT in the last 72 hours. Thyroid Function Tests: No results for input(s): TSH, T4TOTAL, FREET4, T3FREE, THYROIDAB in the last 72 hours. Anemia Panel: No results for input(s): VITAMINB12, FOLATE, FERRITIN, TIBC, IRON, RETICCTPCT in the last 72 hours. Sepsis Labs: No results for input(s): PROCALCITON, LATICACIDVEN in the last 168 hours.  No results found for  this or any previous visit (from the past 240 hours).       Radiology Studies: CT ABDOMEN PELVIS W CONTRAST Result Date: 12/27/2024 EXAM: CT ABDOMEN AND PELVIS WITH CONTRAST 12/27/2024 06:06:37 PM TECHNIQUE: CT of the abdomen and pelvis was performed with the administration of intravenous contrast. Multiplanar reformatted images are provided for review. Automated exposure control, iterative reconstruction, and/or weight-based adjustment of the mA/kV was utilized to reduce the radiation dose to as low as reasonably achievable. 75 mL of iohexol  (OMNIPAQUE ) 350 MG/ML injection was administered. COMPARISON: Prior study dated 12/13/2024. CLINICAL HISTORY: Abdominal pain, acute, nonlocalized. Pancreatic cancer currently undergoing chemotherapy. FINDINGS: LOWER CHEST: No acute abnormality. LIVER: Small volume pneumobilia. GALLBLADDER AND BILE DUCTS: Similarly positioned metallic biliary duct stent in the distal common bile duct (CBD). SPLEEN: No acute abnormality. PANCREAS: Mild pancreatic parenchymal atrophy. The hypodense mass centered in the pancreatic head uncinate process is less conspicuous, but measures approximately 1.4 x 1.3 cm (previously 2.5 x 2 cm). Unchanged 3.4 cm multiseptated cystic lesion in the pancreatic tail with a chunky calcification present. ADRENAL GLANDS: No acute abnormality. KIDNEYS, URETERS AND BLADDER: Small nonobstructive right lower pole calyceal calculus. Unchanged metallic density structure within the lower pole of the left kidney. No hydronephrosis. No perinephric or periureteral stranding. The urinary bladder is mostly decompressed, without focal abnormality. GI AND BOWEL: Stomach demonstrates no acute abnormality. Fluid distended, but not dilated small bowel throughout the abdomen. No small bowel wall thickening. Normal gas filled noninflamed appendix. Small volume fecal loading throughout the colon. There is no bowel obstruction. PERITONEUM AND RETROPERITONEUM: No ascites. No  free air. No free pelvic fluid. VASCULATURE: Aorta is normal in caliber. Diffuse aortoiliac atherosclerosis. LYMPH NODES: No lymphadenopathy. REPRODUCTIVE ORGANS: No prostatomegaly. BONES AND SOFT TISSUES: Multilevel degenerative disc disease of the spine. Moderate bilateral hip osteoarthritis. No acute osseous abnormality. No focal soft tissue abnormality. IMPRESSION: 1. No acute abnormality within the abdomen or pelvis. 2. Decreased size and conspicuity of the pancreatic head and uncinate process mass, measuring approximately 1.4 x 1.3 cm, previously 2.5 x 2.0 cm. No evidence of metas burn tatic disease. 3. Unchanged 3.4 cm multiseptated cystic lesion in the pancreatic tail with a single chunky calcification. 4. Metallic biliary duct stent in the distal common bile duct with small volume pneumobilia. 5. Nonobstructive right lower pole calculus. No hydronephrosis in either kidney. Electronically signed by: Rogelia Myers MD 12/27/2024 06:37 PM EST RP Workstation: [id number]   CT Angio Chest PE W and/or Wo Contrast Result Date: 12/27/2024 EXAM: CTA CHEST 12/27/2024 06:06:37 PM TECHNIQUE: CTA of the chest was performed after  the administration of 75 mL of iohexol  (OMNIPAQUE ) 350 MG/ML injection. Multiplanar reformatted images are provided for review. MIP images are provided for review. Automated exposure control, iterative reconstruction, and/or weight based adjustment of the mA/kV was utilized to reduce the radiation dose to as low as reasonably achievable. COMPARISON: None available. CLINICAL HISTORY: Pulmonary embolism (PE) suspected, high probability. FINDINGS: PULMONARY ARTERIES: Pulmonary arteries are adequately opacified for evaluation. No acute pulmonary embolus. Main pulmonary artery is normal in caliber. MEDIASTINUM: right chest port in place, terminating in the right atrium. Trace pericardial effusion. Severe multivessel coronary atherosclerosis. There is no acute abnormality of the thoracic aorta.  Normal variant 2 vessel aortic arch anatomy. Scattered aortic atherosclerosis. LYMPH NODES: No mediastinal, hilar or axillary lymphadenopathy. LUNGS AND PLEURA: Posterior bibasilar dependent atelectasis. No focal consolidation or pulmonary edema. No evidence of pleural effusion or pneumothorax. UPPER ABDOMEN: For findings below the diaphragm, please see that separately dictated CT of the abdomen and pelvis report, which was performed concurrently. SOFT TISSUES AND BONES: No acute bone or soft tissue abnormality. IMPRESSION: 1. No pulmonary embolism. No pneumonia, pulmonary edema, or pleural effusion. Electronically signed by: Rogelia Myers MD 12/27/2024 06:22 PM EST RP Workstation: [id number]   DG Chest 2 View Result Date: 12/27/2024 CLINICAL DATA:  Chest pain EXAM: CHEST - 2 VIEW COMPARISON:  December 13, 2024 FINDINGS: The heart size and mediastinal contours are within normal limits. Right internal jugular Port-A-Cath is unchanged. Both lungs are clear. The visualized skeletal structures are unremarkable. IMPRESSION: No active cardiopulmonary disease. Electronically Signed   By: Lynwood Landy Raddle M.D.   On: 12/27/2024 16:48    Scheduled Meds:  amLODipine   10 mg Oral Daily   aspirin  EC  81 mg Oral Daily   atorvastatin   80 mg Oral QPM   bisacodyl   10 mg Rectal Daily   carvedilol   6.25 mg Oral BID WC   Chlorhexidine  Gluconate Cloth  6 each Topical Daily   ciprofloxacin   500 mg Oral BID   clopidogrel   75 mg Oral Q breakfast   doxycycline   100 mg Oral BID   feeding supplement  237 mL Oral BID BM   fluticasone  furoate-vilanterol  1 puff Inhalation Daily   fondaparinux  (ARIXTRA ) injection  2.5 mg Subcutaneous Q24H   insulin  aspart  0-5 Units Subcutaneous QHS   insulin  aspart  0-9 Units Subcutaneous TID WC   insulin  glargine-yfgn  20 Units Subcutaneous Daily   latanoprost   1 drop Both Eyes QHS   mirtazapine   15 mg Oral QHS   OLANZapine   2.5 mg Oral QHS   pantoprazole   40 mg Oral Daily   polyethylene  glycol  17 g Oral BID   senna-docusate  3 tablet Oral BID   SMOG  300 mL Rectal Once   Continuous Infusions:  sodium chloride  50 mL/hr at 12/28/24 0126     LOS: 0 days    Almarie KANDICE Hoots, MD  12/28/2024, 7:36 AM   "

## 2024-12-28 NOTE — Progress Notes (Signed)
 " PROGRESS NOTE    Jose [name]  FMW:[id number] DOB: [birth year] DOA: 12/27/2024 PCP: [facility]   Brief Narrative: 63 year old male with a history of pancreatic adenocarcinoma currently on chemotherapy at [facility] oncologist Dr. Oneil Chihuahua on gemcitabine and paclitaxel, type 2 diabetes diabetic nephropathy with glomerulosclerosis stage IIIa CKD right AKA, depression gout CBD stenting CAD COPD hypertension hyperlipidemia PAD admitted with poor p.o. intake AKI and constipation.  He reports nausea and vomiting and has not had a bowel movement for 4 days prior to admission.  CT scan abdomen does not show any evidence of obstruction but showed fluid distended small bowel throughout the abdomen and small volume fecal loading throughout the colon.    He was admitted to [facility] January 20 through January 27 for acute encephalopathy neutropenic fever and sepsis and found to have left great toe osteomyelitis by MRI.  Ortho was consulted recommended oral antibiotics and outpatient follow-up with Dr Elsa.  During that hospital stay he had thrombocytopenia likely from chemo and Lovenox .  After stopping Lovenox  his count improved and he received fondaparinux  for DVT prophylaxis.  He He takes Dilaudid  at home on a regular basis and is mostly bedbound.  He lives alone.  A CT of the chest showed no evidence of PE.  CT abdomen and pelvis -No acute abnormality within the abdomen or pelvis. Decreased size and conspicuity of the pancreatic head and uncinate process mass, measuring approximately 1.4 x 1.3 cm, previously 2.5 x 2.0 cm. No evidence of metas burn tatic disease. Unchanged 3.4 cm multiseptated cystic lesion in the pancreatic tail with a single chunky calcification. Metallic biliary duct stent in the distal common bile duct with small volume pneumobilia. Nonobstructive right lower pole calculus. No hydronephrosis in either kidney.  Assessment & Plan:   Principal Problem:   AKI (acute kidney  injury) Active Problems:   CAD (coronary artery disease)   Constipation   Abdominal pain   Elevated brain natriuretic peptide (BNP) level   #1 Opioid-induced constipation in the setting of daily Dilaudid  use for cancer-related pain and pancreatic adenocarcinoma.  Patient has not had a BM for 4 days.  CT scan of the abdomen does not show evidence of obstruction but does show fluid-filled loops of small bowel and stool burden and large bowel. Start MiraLAX  twice daily Dulcolax suppository and oral Dulcolax Senna daily Smog enema if no response with above He should be on daily stool softeners/laxatives on discharge to prevent this from happening.  #2 AKI on CKD stage III A due to poor p.o. intake nausea and vomiting.  CT abdomen showed no evidence of obstructive uropathy or hydronephrosis.  Renal functions improving with IV fluids. Creatinine 1.93 from 2.13. Continue IV fluids and follow-up labs in a.m.  #3 elevated proBNP with history of CAD patient has no evidence of fluid overload in fact he looks more on the dry side.  This could be falsely elevated due to AKI.  Continue IV fluids at a slow rate.  Echo in 2023 EF 65 to 70%.  Follow-up echo was ordered on this admission pending. Continue aspirin  Plavix  beta-blocker and statin  #4 pancreatic adenocarcinoma followed by [facility] oncology Dr. Chihuahua.  Continue as needed Dilaudid  and Oxy.  #5 elevated alkaline phosphatase with mildly elevated AST and ALT with normal bilirubin follow-up in AM.  Levels are trending down.  #6 anemia of chronic disease/iron deficiency-no signs of active bleeding.  Hemoglobin 7.7 from 8.2 this admission.  Likely from hemodilution. Last iron  panel done in 2024 with iron of 8, TIBC 162 ferritin 1502. Will repeat an iron panel in AM.  #7 type 2 diabetes with hyperglycemia uncontrolled Last A1c was 7.8 from January 2026. Continue long-acting insulin  and SSI.  #8 recent left great toe osteomyelitis on  doxycycline  and ciprofloxacin  to finish the course.  He does have follow-up appointment with Ortho next week.  Continue antibiotics till seen by Ortho.  #9 essential hypertension on amlodipine  and carvedilol   #10 mood disorder on Zyprexa  and Remeron   #11 GERD on PPI  #12 DVT prophylaxis initially he was on fondaparinux  however with creatinine clearance of 30 this is contraindicated will try Lovenox  his HIT panel from last admission was negative.  Follow-up platelets.  Currently stable.  Fondaparinux  was stopped and Lovenox  was started.    Estimated body mass index is 19.19 kg/m as calculated from the following:   Height as of this encounter: 5' 11 (1.803 m).   Weight as of this encounter: 62.4 kg.  DVT prophylaxis: LOVENOX  Code Status: full Family Communication: none Disposition Plan:  Status is: Observation   Consultants:  none  Procedures:none Antimicrobials:  Anti-infectives (From admission, onward)    Start     Dose/Rate Route Frequency Ordered Stop   12/27/24 2200  ciprofloxacin  (CIPRO ) tablet 500 mg        500 mg Oral 2 times daily 12/27/24 2126     12/27/24 2200  doxycycline  (VIBRA -TABS) tablet 100 mg        100 mg Oral 2 times daily 12/27/24 2126          Subjective: C/o constipation no bm since admit Nauseous   Objective: Vitals:   12/27/24 2145 12/27/24 2149 12/28/24 0231 12/28/24 0617  BP: (!) 151/88  134/85 138/78  Pulse: (!) 103  94 88  Resp: 16  18 18   Temp: 98.4 F (36.9 C)  98.5 F (36.9 C) 98.2 F (36.8 C)  TempSrc: Oral  Oral Oral  SpO2: 100%  99% 98%  Weight:  62.4 kg    Height:  5' 11 (1.803 m)      Intake/Output Summary (Last 24 hours) at 12/28/2024 0736 Last data filed at 12/28/2024 0126 Gross per 24 hour  Intake 162.22 ml  Output --  Net 162.22 ml   Filed Weights   12/27/24 2149  Weight: 62.4 kg    Examination:  General exam: Appears in nad chronically ill appearing Respiratory system: Clear to auscultation. Respiratory  effort normal. Cardiovascular system: reg Gastrointestinal system: Abdomen is distended, soft and  generalized tender. Normal bowel sounds heard. Central nervous system: Alert and oriented. No focal neurological deficits. Extremities: r aka  Data Reviewed: I have personally reviewed following labs and imaging studies  CBC: Recent Labs  Lab 12/27/24 1606 12/28/24 0302  WBC 10.3 7.8  HGB 8.2* 7.7*  HCT 24.7* 23.2*  MCV 77.9* 78.1*  PLT 668* 622*   Basic Metabolic Panel: Recent Labs  Lab 12/27/24 1606 12/28/24 0302  NA 138 144  K 4.1 4.2  CL 104 110  CO2 23 24  GLUCOSE 334* 249*  BUN 31* 25*  CREATININE 2.13* 1.93*  CALCIUM  9.2 9.0   GFR: Estimated Creatinine Clearance: 33.7 mL/min (A) (by C-G formula based on SCr of 1.93 mg/dL (H)). Liver Function Tests: Recent Labs  Lab 12/27/24 1606 12/28/24 0302  AST 58* 45*  ALT 59* 54*  ALKPHOS 721* 656*  BILITOT 0.4 0.4  PROT 6.2* 5.8*  ALBUMIN 3.2* 3.1*   Recent Labs  Lab 12/27/24 1606  LIPASE <10*   No results for input(s): AMMONIA in the last 168 hours. Coagulation Profile: No results for input(s): INR, PROTIME in the last 168 hours. Cardiac Enzymes: No results for input(s): CKTOTAL, CKMB, CKMBINDEX, TROPONINI in the last 168 hours. BNP (last 3 results) Recent Labs    12/27/24 1606  PROBNP 756.0*   HbA1C: No results for input(s): HGBA1C in the last 72 hours. CBG: Recent Labs  Lab 12/27/24 2216  GLUCAP 200*   Lipid Profile: No results for input(s): CHOL, HDL, LDLCALC, TRIG, CHOLHDL, LDLDIRECT in the last 72 hours. Thyroid Function Tests: No results for input(s): TSH, T4TOTAL, FREET4, T3FREE, THYROIDAB in the last 72 hours. Anemia Panel: No results for input(s): VITAMINB12, FOLATE, FERRITIN, TIBC, IRON, RETICCTPCT in the last 72 hours. Sepsis Labs: No results for input(s): PROCALCITON, LATICACIDVEN in the last 168 hours.  No results found for  this or any previous visit (from the past 240 hours).       Radiology Studies: CT ABDOMEN PELVIS W CONTRAST Result Date: 12/27/2024 EXAM: CT ABDOMEN AND PELVIS WITH CONTRAST 12/27/2024 06:06:37 PM TECHNIQUE: CT of the abdomen and pelvis was performed with the administration of intravenous contrast. Multiplanar reformatted images are provided for review. Automated exposure control, iterative reconstruction, and/or weight-based adjustment of the mA/kV was utilized to reduce the radiation dose to as low as reasonably achievable. 75 mL of iohexol  (OMNIPAQUE ) 350 MG/ML injection was administered. COMPARISON: Prior study dated 12/13/2024. CLINICAL HISTORY: Abdominal pain, acute, nonlocalized. Pancreatic cancer currently undergoing chemotherapy. FINDINGS: LOWER CHEST: No acute abnormality. LIVER: Small volume pneumobilia. GALLBLADDER AND BILE DUCTS: Similarly positioned metallic biliary duct stent in the distal common bile duct (CBD). SPLEEN: No acute abnormality. PANCREAS: Mild pancreatic parenchymal atrophy. The hypodense mass centered in the pancreatic head uncinate process is less conspicuous, but measures approximately 1.4 x 1.3 cm (previously 2.5 x 2 cm). Unchanged 3.4 cm multiseptated cystic lesion in the pancreatic tail with a chunky calcification present. ADRENAL GLANDS: No acute abnormality. KIDNEYS, URETERS AND BLADDER: Small nonobstructive right lower pole calyceal calculus. Unchanged metallic density structure within the lower pole of the left kidney. No hydronephrosis. No perinephric or periureteral stranding. The urinary bladder is mostly decompressed, without focal abnormality. GI AND BOWEL: Stomach demonstrates no acute abnormality. Fluid distended, but not dilated small bowel throughout the abdomen. No small bowel wall thickening. Normal gas filled noninflamed appendix. Small volume fecal loading throughout the colon. There is no bowel obstruction. PERITONEUM AND RETROPERITONEUM: No ascites. No  free air. No free pelvic fluid. VASCULATURE: Aorta is normal in caliber. Diffuse aortoiliac atherosclerosis. LYMPH NODES: No lymphadenopathy. REPRODUCTIVE ORGANS: No prostatomegaly. BONES AND SOFT TISSUES: Multilevel degenerative disc disease of the spine. Moderate bilateral hip osteoarthritis. No acute osseous abnormality. No focal soft tissue abnormality. IMPRESSION: 1. No acute abnormality within the abdomen or pelvis. 2. Decreased size and conspicuity of the pancreatic head and uncinate process mass, measuring approximately 1.4 x 1.3 cm, previously 2.5 x 2.0 cm. No evidence of metas burn tatic disease. 3. Unchanged 3.4 cm multiseptated cystic lesion in the pancreatic tail with a single chunky calcification. 4. Metallic biliary duct stent in the distal common bile duct with small volume pneumobilia. 5. Nonobstructive right lower pole calculus. No hydronephrosis in either kidney. Electronically signed by: Rogelia Myers MD 12/27/2024 06:37 PM EST RP Workstation: [id number]   CT Angio Chest PE W and/or Wo Contrast Result Date: 12/27/2024 EXAM: CTA CHEST 12/27/2024 06:06:37 PM TECHNIQUE: CTA of the chest was performed after  the administration of 75 mL of iohexol  (OMNIPAQUE ) 350 MG/ML injection. Multiplanar reformatted images are provided for review. MIP images are provided for review. Automated exposure control, iterative reconstruction, and/or weight based adjustment of the mA/kV was utilized to reduce the radiation dose to as low as reasonably achievable. COMPARISON: None available. CLINICAL HISTORY: Pulmonary embolism (PE) suspected, high probability. FINDINGS: PULMONARY ARTERIES: Pulmonary arteries are adequately opacified for evaluation. No acute pulmonary embolus. Main pulmonary artery is normal in caliber. MEDIASTINUM: right chest port in place, terminating in the right atrium. Trace pericardial effusion. Severe multivessel coronary atherosclerosis. There is no acute abnormality of the thoracic aorta.  Normal variant 2 vessel aortic arch anatomy. Scattered aortic atherosclerosis. LYMPH NODES: No mediastinal, hilar or axillary lymphadenopathy. LUNGS AND PLEURA: Posterior bibasilar dependent atelectasis. No focal consolidation or pulmonary edema. No evidence of pleural effusion or pneumothorax. UPPER ABDOMEN: For findings below the diaphragm, please see that separately dictated CT of the abdomen and pelvis report, which was performed concurrently. SOFT TISSUES AND BONES: No acute bone or soft tissue abnormality. IMPRESSION: 1. No pulmonary embolism. No pneumonia, pulmonary edema, or pleural effusion. Electronically signed by: Rogelia Myers MD 12/27/2024 06:22 PM EST RP Workstation: [id number]   DG Chest 2 View Result Date: 12/27/2024 CLINICAL DATA:  Chest pain EXAM: CHEST - 2 VIEW COMPARISON:  December 13, 2024 FINDINGS: The heart size and mediastinal contours are within normal limits. Right internal jugular Port-A-Cath is unchanged. Both lungs are clear. The visualized skeletal structures are unremarkable. IMPRESSION: No active cardiopulmonary disease. Electronically Signed   By: Lynwood Landy Raddle M.D.   On: 12/27/2024 16:48    Scheduled Meds:  amLODipine   10 mg Oral Daily   aspirin  EC  81 mg Oral Daily   atorvastatin   80 mg Oral QPM   bisacodyl   10 mg Rectal Daily   carvedilol   6.25 mg Oral BID WC   Chlorhexidine  Gluconate Cloth  6 each Topical Daily   ciprofloxacin   500 mg Oral BID   clopidogrel   75 mg Oral Q breakfast   doxycycline   100 mg Oral BID   feeding supplement  237 mL Oral BID BM   fluticasone  furoate-vilanterol  1 puff Inhalation Daily   fondaparinux  (ARIXTRA ) injection  2.5 mg Subcutaneous Q24H   insulin  aspart  0-5 Units Subcutaneous QHS   insulin  aspart  0-9 Units Subcutaneous TID WC   insulin  glargine-yfgn  20 Units Subcutaneous Daily   latanoprost   1 drop Both Eyes QHS   mirtazapine   15 mg Oral QHS   OLANZapine   2.5 mg Oral QHS   pantoprazole   40 mg Oral Daily   polyethylene  glycol  17 g Oral BID   senna-docusate  3 tablet Oral BID   SMOG  300 mL Rectal Once   Continuous Infusions:  sodium chloride  50 mL/hr at 12/28/24 0126     LOS: 0 days    Almarie KANDICE Hoots, MD  12/28/2024, 7:36 AM   "

## 2024-12-28 NOTE — Progress Notes (Addendum)
 Initial Nutrition Assessment  INTERVENTION:   Liberalize diet from Heart Healthy Carb Mod to just Carb Mod.   Ensure Plus High Protein po TID, each supplement provides 350 kcal and 20 grams of protein.   NUTRITION DIAGNOSIS:   Increased nutrient needs related to cancer and cancer related treatments as evidenced by estimated needs.  GOAL:   Patient will meet greater than or equal to 90% of their needs  MONITOR:   PO intake, Supplement acceptance, Weight trends  REASON FOR ASSESSMENT:   Malnutrition Screening Tool   ASSESSMENT:   PMH: pancreatic adenocarcinoma, CAD, AKA, T2DM, CKD3, HTN, COPD Admitted with: AKI; presenting abdominal pain and constipation  Pt in room, lying in bed, no family/visitors at bedside.   Pt with dehydration, pain, and constipation. Currently on a bowel regimen. Continuing antibiotics for osteomyelitis.    Briefly spoke to pt at bedside before he stated he did not want to talk. Pt reported appetite is poor and is in a lot of pain. He stated that he had a can of tomato soup yesterday and has had nothing yet today. He has been drinking water , MiraLAX , ginger ale and some juice. He has not had a BM for 4 days suspected effect of Dilaudid . Of note, pt is dehydrated and vomiting. His last chemo tx was last week.   Pt was unable to quantify his UBW, but states that he has lost a lot of weight.  Current wt 62.4 kg, wt July 2025 was 72.6 kg resulting in a 14% wt loss x 8 months which is significant for timeframe.    Pt requests RD to return at another time. Will attempt to gather further history and NFPE at a later time.   Admit weight: 62.4 kg  Current weight: 62.4 kg   Average Meal Intake: No meals recorded   Nutritionally Relevant Medications:  Chemo Meds: gemcitabine and paclitaxel   IV Dilaudid    Scheduled Meds:  atorvastatin   80 mg Oral QPM   bisacodyl   10 mg Rectal Daily   enoxaparin  (LOVENOX ) injection  40 mg Subcutaneous Q24H   feeding  supplement  237 mL Oral BID BM   insulin  aspart  0-5 Units Subcutaneous QHS   insulin  aspart  0-9 Units Subcutaneous TID WC   insulin  glargine-yfgn  20 Units Subcutaneous Daily   linagliptin   5 mg Oral Daily   pantoprazole   40 mg Oral Daily   senna-docusate  3 tablet Oral BID   Continuous Infusions:  sodium chloride  50 mL/hr at 12/28/24 1000   Labs Reviewed: of note, abnormal labs his baseline  Alkaline Phosphatase : 656 AST: 45 ALT: 54 BUN: 25 Creatinine: 1.93 GFR: 38 CBG ranges from 192-200 mg/dL over the last 24 hours HgbA1c 7.8  I&O: UOP 300 mL   NUTRITION - FOCUSED PHYSICAL EXAM:  Defer to follow up for exam.   Diet Order:   Diet Order             Diet heart healthy/carb modified Room service appropriate? Yes; Fluid consistency: Thin  Diet effective now                   EDUCATION NEEDS:   Not appropriate for education at this time  Skin:  Skin Assessment: Reviewed RN Assessment  Last BM:  1/31  Height:   Ht Readings from Last 1 Encounters:  12/27/24 5' 11 (1.803 m)    Weight:   Wt Readings from Last 1 Encounters:  12/27/24 62.4 kg  Ideal Body Weight:  70.38 kg (adjusted for  R AKA)  BMI:  Body mass index is 231.3 kg/m (adjusted for R AKA)   Estimated Nutritional Needs:   Kcal:  2000-2300  Protein:  100-115 gram  Fluid:  2.0 L    Con Friends, Dietetic Intern

## 2024-12-29 LAB — COMPREHENSIVE METABOLIC PANEL WITH GFR
ALT: 55 U/L — ABNORMAL HIGH (ref 0–44)
AST: 63 U/L — ABNORMAL HIGH (ref 15–41)
Albumin: 3.3 g/dL — ABNORMAL LOW (ref 3.5–5.0)
Alkaline Phosphatase: 706 U/L — ABNORMAL HIGH (ref 38–126)
Anion gap: 11 (ref 5–15)
BUN: 21 mg/dL (ref 8–23)
CO2: 24 mmol/L (ref 22–32)
Calcium: 9.8 mg/dL (ref 8.9–10.3)
Chloride: 108 mmol/L (ref 98–111)
Creatinine, Ser: 2.14 mg/dL — ABNORMAL HIGH (ref 0.61–1.24)
GFR, Estimated: 34 mL/min — ABNORMAL LOW
Glucose, Bld: 89 mg/dL (ref 70–99)
Potassium: 3.9 mmol/L (ref 3.5–5.1)
Sodium: 143 mmol/L (ref 135–145)
Total Bilirubin: 0.4 mg/dL (ref 0.0–1.2)
Total Protein: 6 g/dL — ABNORMAL LOW (ref 6.5–8.1)

## 2024-12-29 LAB — GLUCOSE, CAPILLARY
Glucose-Capillary: 123 mg/dL — ABNORMAL HIGH (ref 70–99)
Glucose-Capillary: 159 mg/dL — ABNORMAL HIGH (ref 70–99)
Glucose-Capillary: 95 mg/dL (ref 70–99)
Glucose-Capillary: 115 mg/dL — ABNORMAL HIGH (ref 70–99)

## 2024-12-29 LAB — CBC
HCT: 23.8 % — ABNORMAL LOW (ref 39.0–52.0)
Hemoglobin: 7.8 g/dL — ABNORMAL LOW (ref 13.0–17.0)
MCH: 25.7 pg — ABNORMAL LOW (ref 26.0–34.0)
MCHC: 32.8 g/dL (ref 30.0–36.0)
MCV: 78.3 fL — ABNORMAL LOW (ref 80.0–100.0)
Platelets: 627 10*3/uL — ABNORMAL HIGH (ref 150–400)
RBC: 3.04 MIL/uL — ABNORMAL LOW (ref 4.22–5.81)
RDW: 21.2 % — ABNORMAL HIGH (ref 11.5–15.5)
WBC: 7.3 10*3/uL (ref 4.0–10.5)
nRBC: 0 % (ref 0.0–0.2)

## 2024-12-29 LAB — IRON AND TIBC
Iron: 28 ug/dL — ABNORMAL LOW (ref 45–182)
Saturation Ratios: 13 % — ABNORMAL LOW (ref 17.9–39.5)
TIBC: 209 ug/dL — ABNORMAL LOW (ref 250–450)
UIBC: 181 ug/dL

## 2024-12-29 LAB — GAMMA GT: GGT: 593 U/L — ABNORMAL HIGH (ref 7–50)

## 2024-12-29 LAB — FERRITIN: Ferritin: 2952 ng/mL — ABNORMAL HIGH (ref 24–336)

## 2024-12-29 MED ORDER — BISACODYL 10 MG RE SUPP
10.0000 mg | Freq: Once | RECTAL | Status: AC
  Administered 2024-12-29: 10 mg via RECTAL
  Filled 2024-12-29: qty 1

## 2024-12-29 MED ORDER — SODIUM CHLORIDE 0.9 % IV SOLN: INTRAVENOUS | Status: DC

## 2024-12-29 MED ORDER — LACTULOSE 10 GM/15ML PO SOLN
20.0000 g | Freq: Two times a day (BID) | ORAL | Status: AC
  Administered 2024-12-29 – 2024-12-30 (×3): 20 g via ORAL
  Filled 2024-12-29 (×3): qty 30

## 2024-12-29 NOTE — Progress Notes (Signed)
 " PROGRESS NOTE    Jose [name]  FMW:[id number] DOB: Mar 13, 1961 DOA: 12/27/2024 PCP: [facility]   Brief Narrative: 64 year old male with a history of pancreatic adenocarcinoma currently on chemotherapy at [facility] oncologist Dr. Oneil Chihuahua on gemcitabine and paclitaxel, type 2 diabetes diabetic nephropathy with glomerulosclerosis stage IIIa CKD right AKA, depression gout CBD stenting CAD COPD hypertension hyperlipidemia PAD admitted with poor p.o. intake AKI and constipation.  He reports nausea and vomiting and has not had a bowel movement for 4 days prior to admission.  CT scan abdomen does not show any evidence of obstruction but showed fluid distended small bowel throughout the abdomen and small volume fecal loading throughout the colon.    He was admitted to [facility] January 20 through January 27 for acute encephalopathy neutropenic fever and sepsis and found to have left great toe osteomyelitis by MRI.  Ortho was consulted recommended oral antibiotics and outpatient follow-up with Dr Elsa.  During that hospital stay he had thrombocytopenia likely from chemo and Lovenox .  After stopping Lovenox  his count improved and he received fondaparinux  for DVT prophylaxis.  He He takes Dilaudid  at home on a regular basis and is mostly bedbound.  He lives alone.  A CT of the chest showed no evidence of PE.  CT abdomen and pelvis -No acute abnormality within the abdomen or pelvis. Decreased size and conspicuity of the pancreatic head and uncinate process mass, measuring approximately 1.4 x 1.3 cm, previously 2.5 x 2.0 cm. No evidence of metas burn tatic disease. Unchanged 3.4 cm multiseptated cystic lesion in the pancreatic tail with a single chunky calcification. Metallic biliary duct stent in the distal common bile duct with small volume pneumobilia. Nonobstructive right lower pole calculus. No hydronephrosis in either kidney.  Assessment & Plan:   Principal Problem:   AKI (acute kidney  injury) Active Problems:   CAD (coronary artery disease)   Constipation   Abdominal pain   Elevated brain natriuretic peptide (BNP) level   #1 Opioid-induced constipation in the setting of daily Dilaudid  use for cancer-related pain and pancreatic adenocarcinoma.  Patient has not had a BM for 4 days prior to admission to hospital  CT scan of the abdomen does not show evidence of obstruction but does show fluid-filled loops of small bowel and stool burden and large bowel. He reports having 1 BM overnight.  Continue MiraLAX  Dulcolax senna. Lactulose  x 3 doses   #2 AKI on CKD stage III A due to poor p.o. intake nausea and vomiting.  CT abdomen showed no evidence of obstructive uropathy or hydronephrosis.   Creatinine 2.14 from 1.93 in spite of IV fluids.  Continue IV fluids and follow-up labs in AM.   #3 elevated proBNP with history of CAD patient has no evidence of fluid overload in fact he looks more on the dry side.  This could be falsely elevated due to AKI.  Continue IV fluids at a slow rate.  Echo in 2023 EF 65 to 70%.  Echocardiogram EF 55 to 60% with severe concentric LVH and grade 1 diastolic dysfunction Continue aspirin  Plavix  beta-blocker and statin  #4 pancreatic adenocarcinoma followed by [facility] oncology Dr. Chihuahua.  Continue as needed Dilaudid  and Oxy.  #5 elevated alkaline phosphatase with mildly elevated AST and ALT with normal bilirubin - Will hold high-dose Lipitor  and recheck levels in a.m.   Will add GGT to today's labs to see if this is hepatic in origin if not consider further workup for meds versus follow-up  with Dr. Tina his oncologist as outpatient.   #6 anemia of chronic disease/iron deficiency-no signs of active bleeding.  Hemoglobin 7.5 from 7.7 from 8.2 this admission.  Likely from hemodilution. Last iron panel done in 2024 with iron of 8, TIBC 162 ferritin 1502. Iron panel indicates low iron count with decreased saturation. Will transfuse 1 unit of packed  RBC.  #7 type 2 diabetes with hyperglycemia uncontrolled Last A1c was 7.8 from January 2026. Continue long-acting insulin  and SSI.  #8 recent left great toe osteomyelitis on doxycycline  and ciprofloxacin  to finish the course.  He does have follow-up appointment with Ortho next week.  Continue antibiotics till seen by Ortho.  #9 essential hypertension on amlodipine  and carvedilol   #10 mood disorder on Zyprexa  and Remeron   #11 GERD on PPI  #12 DVT prophylaxis initially he was on fondaparinux  however with creatinine clearance of 30 this is contraindicated will try Lovenox  his HIT panel from last admission was negative.  Follow-up platelets.  Currently stable.  Fondaparinux  was stopped and Lovenox  was started.    Estimated body mass index is 19.19 kg/m as calculated from the following:   Height as of this encounter: 5' 11 (1.803 m).   Weight as of this encounter: 62.4 kg.  DVT prophylaxis: LOVENOX  Code Status: full Family Communication: none Disposition Plan:  Status is: Observation   Consultants:  none  Procedures:none Antimicrobials:  Anti-infectives (From admission, onward)    Start     Dose/Rate Route Frequency Ordered Stop   12/27/24 2200  ciprofloxacin  (CIPRO ) tablet 500 mg        500 mg Oral 2 times daily 12/27/24 2126     12/27/24 2200  doxycycline  (VIBRA -TABS) tablet 100 mg        100 mg Oral 2 times daily 12/27/24 2126          Subjective:  Still nauseous has not had much to eat or drink Creatinine up LFTs up Had 1 BM yesterday none since then has been taking MiraLAX  senna Dulcolax and smog enema  Objective: Vitals:   12/28/24 1305 12/28/24 2033 12/29/24 0559 12/29/24 0805  BP: 116/72 131/85 112/71 133/64  Pulse: 95 88 (!) 54 92  Resp: 16 18 18 18   Temp: 98.5 F (36.9 C) 98.8 F (37.1 C) 98.7 F (37.1 C) 98.6 F (37 C)  TempSrc: Oral Oral Oral Oral  SpO2: 98% 97% 99% 97%  Weight:      Height:        Intake/Output Summary (Last 24 hours) at  12/29/2024 1022 Last data filed at 12/29/2024 9077 Gross per 24 hour  Intake 659.75 ml  Output 1200 ml  Net -540.25 ml   Filed Weights   12/27/24 2149  Weight: 62.4 kg    Examination:  General exam: Appears in nad chronically ill appearing Respiratory system: Clear to auscultation. Respiratory effort normal. Cardiovascular system: reg Gastrointestinal system: Abdomen is distended, soft and  generalized tender. Normal bowel sounds heard. Central nervous system: Alert and oriented. No focal neurological deficits. Extremities: r aka  Data Reviewed: I have personally reviewed following labs and imaging studies  CBC: Recent Labs  Lab 12/27/24 1606 12/28/24 0302 12/29/24 0451  WBC 10.3 7.8 7.3  HGB 8.2* 7.7* 7.8*  HCT 24.7* 23.2* 23.8*  MCV 77.9* 78.1* 78.3*  PLT 668* 622* 627*   Basic Metabolic Panel: Recent Labs  Lab 12/27/24 1606 12/28/24 0302 12/29/24 0451  NA 138 144 143  K 4.1 4.2 3.9  CL 104 110 108  CO2 23 24 24   GLUCOSE 334* 249* 89  BUN 31* 25* 21  CREATININE 2.13* 1.93* 2.14*  CALCIUM  9.2 9.0 9.8   GFR: Estimated Creatinine Clearance: 30.4 mL/min (A) (by C-G formula based on SCr of 2.14 mg/dL (H)). Liver Function Tests: Recent Labs  Lab 12/27/24 1606 12/28/24 0302 12/29/24 0451  AST 58* 45* 63*  ALT 59* 54* 55*  ALKPHOS 721* 656* 706*  BILITOT 0.4 0.4 0.4  PROT 6.2* 5.8* 6.0*  ALBUMIN 3.2* 3.1* 3.3*   Recent Labs  Lab 12/27/24 1606  LIPASE <10*   No results for input(s): AMMONIA in the last 168 hours. Coagulation Profile: No results for input(s): INR, PROTIME in the last 168 hours. Cardiac Enzymes: No results for input(s): CKTOTAL, CKMB, CKMBINDEX, TROPONINI in the last 168 hours. BNP (last 3 results) Recent Labs    12/27/24 1606  PROBNP 756.0*   HbA1C: No results for input(s): HGBA1C in the last 72 hours. CBG: Recent Labs  Lab 12/28/24 0842 12/28/24 1115 12/28/24 1626 12/28/24 2141 12/29/24 0724  GLUCAP 192*  192* 84 165* 123*   Lipid Profile: No results for input(s): CHOL, HDL, LDLCALC, TRIG, CHOLHDL, LDLDIRECT in the last 72 hours. Thyroid Function Tests: No results for input(s): TSH, T4TOTAL, FREET4, T3FREE, THYROIDAB in the last 72 hours. Anemia Panel: Recent Labs    12/29/24 0451  FERRITIN 2,952*  TIBC 209*  IRON 28*   Sepsis Labs: No results for input(s): PROCALCITON, LATICACIDVEN in the last 168 hours.  No results found for this or any previous visit (from the past 240 hours).       Radiology Studies: ECHOCARDIOGRAM COMPLETE Result Date: 12/28/2024    ECHOCARDIOGRAM REPORT   Patient Name:   INGVALD THEISEN Casco Date of Exam: 12/28/2024 Medical Rec #:  [id number]          Height:       71.0 in Accession #:    [id number]         Weight:       137.6 lb Date of Birth:  [birth year]          BSA:          1.799 m Patient Age:    63 years           BP:           138/78 mmHg Patient Gender: M                  HR:           55 bpm. Exam Location:  Inpatient Procedure: 2D Echo, Cardiac Doppler and Color Doppler (Both Spectral and Color            Flow Doppler were utilized during procedure). Indications:    CHF  History:        Patient has prior history of Echocardiogram examinations, most                 recent 01/28/2022. CAD, COPD; Risk Factors:Hypertension, Diabetes                 and Dyslipidemia.  Sonographer:    Philomena Daring Referring Phys: [id number] RATHORE  Sonographer Comments: Image acquisition challenging due to respiratory motion. IMPRESSIONS  1. Left ventricular ejection fraction, by estimation, is 55 to 60%. The left ventricle has normal function. The left ventricle has no regional wall motion abnormalities. There is severe concentric left ventricular hypertrophy. Left ventricular diastolic  parameters are consistent with Grade I  diastolic dysfunction (impaired relaxation).  2. Right ventricular systolic function is normal. The right ventricular size is  normal.  3. A small pericardial effusion is present. The pericardial effusion is circumferential.  4. The mitral valve is normal in structure. Mild to moderate mitral valve regurgitation.  5. The aortic valve is tricuspid. There is moderate thickening of the aortic valve. Aortic valve regurgitation is not visualized. Aortic valve sclerosis/calcification is present, without any evidence of aortic stenosis.  6. The inferior vena cava is normal in size with greater than 50% respiratory variability, suggesting right atrial pressure of 3 mmHg. FINDINGS  Left Ventricle: Left ventricular ejection fraction, by estimation, is 55 to 60%. The left ventricle has normal function. The left ventricle has no regional wall motion abnormalities. The left ventricular internal cavity size was small. There is severe concentric left ventricular hypertrophy. Left ventricular diastolic parameters are consistent with Grade I diastolic dysfunction (impaired relaxation). Right Ventricle: The right ventricular size is normal. No increase in right ventricular wall thickness. Right ventricular systolic function is normal. Left Atrium: Left atrial size was normal in size. Right Atrium: Right atrial size was normal in size. Pericardium: A small pericardial effusion is present. The pericardial effusion is circumferential. Presence of epicardial fat layer. Mitral Valve: The mitral valve is normal in structure. Mild to moderate mitral valve regurgitation. Tricuspid Valve: The tricuspid valve is normal in structure. Tricuspid valve regurgitation is trivial. No evidence of tricuspid stenosis. Aortic Valve: The aortic valve is tricuspid. There is moderate thickening of the aortic valve. Aortic valve regurgitation is not visualized. Aortic valve sclerosis/calcification is present, without any evidence of aortic stenosis. Pulmonic Valve: The pulmonic valve was normal in structure. Pulmonic valve regurgitation is trivial. No evidence of pulmonic stenosis.  Aorta: The aortic root and ascending aorta are structurally normal, with no evidence of dilitation. Venous: The inferior vena cava is normal in size with greater than 50% respiratory variability, suggesting right atrial pressure of 3 mmHg. IAS/Shunts: No atrial level shunt detected by color flow Doppler.  LEFT VENTRICLE PLAX 2D LVIDd:         2.70 cm   Diastology LVIDs:         1.90 cm   LV e' medial:    5.00 cm/s LV PW:         1.60 cm   LV E/e' medial:  0.1 LV IVS:        1.50 cm   LV e' lateral:   8.49 cm/s LVOT diam:     2.30 cm   LV E/e' lateral: 0.1 LV SV:         9099 LV SV Index:   5059 LVOT Area:     4.15 cm  RIGHT VENTRICLE             IVC RV Basal diam:  2.70 cm     IVC diam: 1.40 cm RV Mid diam:    2.20 cm RV Length:      4.60 cm RV S prime:     15.90 cm/s TAPSE (M-mode): 2.0 cm LEFT ATRIUM           Index        RIGHT ATRIUM LA diam:      3.30 cm 1.83 cm/m   RA Pressure: 3.00 mmHg LA Vol (A2C): 23.6 ml 13.12 ml/m LA Vol (A4C): 21.6 ml 12.01 ml/m  AORTIC VALVE LVOT Vmax:   98.40 cm/s LVOT VTI:    21.900 m MV E velocity: 0.60 cm/s  TRICUSPID VALVE MV A velocity: 78.60 cm/s  Estimated RAP:  3.00 mmHg MV E/A ratio:  0.01                            SHUNTS                            Systemic VTI:  21.90 m                            Systemic Diam: 2.30 cm Kardie Tobb DO Electronically signed by Dub Huntsman DO Signature Date/Time: 12/28/2024/1:08:05 PM    Final    CT ABDOMEN PELVIS W CONTRAST Result Date: 12/27/2024 EXAM: CT ABDOMEN AND PELVIS WITH CONTRAST 12/27/2024 06:06:37 PM TECHNIQUE: CT of the abdomen and pelvis was performed with the administration of intravenous contrast. Multiplanar reformatted images are provided for review. Automated exposure control, iterative reconstruction, and/or weight-based adjustment of the mA/kV was utilized to reduce the radiation dose to as low as reasonably achievable. 75 mL of iohexol  (OMNIPAQUE ) 350 MG/ML injection was administered. COMPARISON: Prior study dated  12/13/2024. CLINICAL HISTORY: Abdominal pain, acute, nonlocalized. Pancreatic cancer currently undergoing chemotherapy. FINDINGS: LOWER CHEST: No acute abnormality. LIVER: Small volume pneumobilia. GALLBLADDER AND BILE DUCTS: Similarly positioned metallic biliary duct stent in the distal common bile duct (CBD). SPLEEN: No acute abnormality. PANCREAS: Mild pancreatic parenchymal atrophy. The hypodense mass centered in the pancreatic head uncinate process is less conspicuous, but measures approximately 1.4 x 1.3 cm (previously 2.5 x 2 cm). Unchanged 3.4 cm multiseptated cystic lesion in the pancreatic tail with a chunky calcification present. ADRENAL GLANDS: No acute abnormality. KIDNEYS, URETERS AND BLADDER: Small nonobstructive right lower pole calyceal calculus. Unchanged metallic density structure within the lower pole of the left kidney. No hydronephrosis. No perinephric or periureteral stranding. The urinary bladder is mostly decompressed, without focal abnormality. GI AND BOWEL: Stomach demonstrates no acute abnormality. Fluid distended, but not dilated small bowel throughout the abdomen. No small bowel wall thickening. Normal gas filled noninflamed appendix. Small volume fecal loading throughout the colon. There is no bowel obstruction. PERITONEUM AND RETROPERITONEUM: No ascites. No free air. No free pelvic fluid. VASCULATURE: Aorta is normal in caliber. Diffuse aortoiliac atherosclerosis. LYMPH NODES: No lymphadenopathy. REPRODUCTIVE ORGANS: No prostatomegaly. BONES AND SOFT TISSUES: Multilevel degenerative disc disease of the spine. Moderate bilateral hip osteoarthritis. No acute osseous abnormality. No focal soft tissue abnormality. IMPRESSION: 1. No acute abnormality within the abdomen or pelvis. 2. Decreased size and conspicuity of the pancreatic head and uncinate process mass, measuring approximately 1.4 x 1.3 cm, previously 2.5 x 2.0 cm. No evidence of metas burn tatic disease. 3. Unchanged 3.4 cm  multiseptated cystic lesion in the pancreatic tail with a single chunky calcification. 4. Metallic biliary duct stent in the distal common bile duct with small volume pneumobilia. 5. Nonobstructive right lower pole calculus. No hydronephrosis in either kidney. Electronically signed by: Rogelia Myers MD 12/27/2024 06:37 PM EST RP Workstation: [id number]   CT Angio Chest PE W and/or Wo Contrast Result Date: 12/27/2024 EXAM: CTA CHEST 12/27/2024 06:06:37 PM TECHNIQUE: CTA of the chest was performed after the administration of 75 mL of iohexol  (OMNIPAQUE ) 350 MG/ML injection. Multiplanar reformatted images are provided for review. MIP images are provided for review. Automated exposure control, iterative reconstruction, and/or weight based adjustment of the mA/kV was utilized to reduce the radiation dose  to as low as reasonably achievable. COMPARISON: None available. CLINICAL HISTORY: Pulmonary embolism (PE) suspected, high probability. FINDINGS: PULMONARY ARTERIES: Pulmonary arteries are adequately opacified for evaluation. No acute pulmonary embolus. Main pulmonary artery is normal in caliber. MEDIASTINUM: right chest port in place, terminating in the right atrium. Trace pericardial effusion. Severe multivessel coronary atherosclerosis. There is no acute abnormality of the thoracic aorta. Normal variant 2 vessel aortic arch anatomy. Scattered aortic atherosclerosis. LYMPH NODES: No mediastinal, hilar or axillary lymphadenopathy. LUNGS AND PLEURA: Posterior bibasilar dependent atelectasis. No focal consolidation or pulmonary edema. No evidence of pleural effusion or pneumothorax. UPPER ABDOMEN: For findings below the diaphragm, please see that separately dictated CT of the abdomen and pelvis report, which was performed concurrently. SOFT TISSUES AND BONES: No acute bone or soft tissue abnormality. IMPRESSION: 1. No pulmonary embolism. No pneumonia, pulmonary edema, or pleural effusion. Electronically signed by:  Rogelia Myers MD 12/27/2024 06:22 PM EST RP Workstation: [id number]   DG Chest 2 View Result Date: 12/27/2024 CLINICAL DATA:  Chest pain EXAM: CHEST - 2 VIEW COMPARISON:  December 13, 2024 FINDINGS: The heart size and mediastinal contours are within normal limits. Right internal jugular Port-A-Cath is unchanged. Both lungs are clear. The visualized skeletal structures are unremarkable. IMPRESSION: No active cardiopulmonary disease. Electronically Signed   By: Lynwood Landy Raddle M.D.   On: 12/27/2024 16:48    Scheduled Meds:  amLODipine   10 mg Oral Daily   aspirin  EC  81 mg Oral Daily   atorvastatin   80 mg Oral QPM   carvedilol   6.25 mg Oral BID WC   Chlorhexidine  Gluconate Cloth  6 each Topical Daily   ciprofloxacin   500 mg Oral BID   clopidogrel   75 mg Oral Q breakfast   doxycycline   100 mg Oral BID   enoxaparin  (LOVENOX ) injection  40 mg Subcutaneous Q24H   feeding supplement  237 mL Oral TID BM   fluticasone  furoate-vilanterol  1 puff Inhalation Daily   insulin  aspart  0-5 Units Subcutaneous QHS   insulin  aspart  0-9 Units Subcutaneous TID WC   insulin  glargine-yfgn  20 Units Subcutaneous Daily   latanoprost   1 drop Both Eyes QHS   linagliptin   5 mg Oral Daily   mirtazapine   15 mg Oral QHS   OLANZapine   2.5 mg Oral QHS   pantoprazole   40 mg Oral Daily   polyethylene glycol  17 g Oral BID   senna-docusate  3 tablet Oral BID   SMOG  300 mL Rectal Once   Continuous Infusions:  sodium chloride  100 mL/hr at 12/29/24 0820     LOS: 0 days    Almarie KANDICE Hoots, MD  12/29/2024, 10:22 AM   "

## 2024-12-29 NOTE — TOC Initial Note (Signed)
 Transition of Care [facility]) - Initial/Assessment Note    Patient Details  Name: Jose [name] MRN: [id number] Date of Birth: 09/27/1961  [facility]) CM/SW Contact:    Toy LITTIE Agar, RN Phone Number:[phone]  12/29/2024, 3:33 PM  Clinical Narrative:                 Inpatient care manager following patient with high risk for readmission. Patient is from home where he functions independently. Currently patient states that he is feeling sick and is unable to fully complete assessment. Patient request that CM come back later.  Expected Discharge Plan: Home/Self Care Barriers to Discharge: Continued Medical Work up   Patient Goals and CMS Choice Patient states their goals for this hospitalization and ongoing recovery are:: To feel better          Expected Discharge Plan and Services In-house Referral: NA Discharge Planning Services: CM Consult   Living arrangements for the past 2 months: Apartment                 DME Arranged: N/A DME Agency: NA       HH Arranged: NA          Prior Living Arrangements/Services Living arrangements for the past 2 months: Apartment Lives with:: Self Patient language and need for interpreter reviewed:: Yes Do you feel safe going back to the place where you live?: Yes      Need for Family Participation in Patient Care: Yes (Comment) Care giver support system in place?: Yes (comment) Current home services: DME (3in 1, rolling walker, shower seat) Criminal Activity/Legal Involvement Pertinent to Current Situation/Hospitalization: No - Comment as needed  Activities of Daily Living   ADL Screening (condition at time of admission) Independently performs ADLs?: Yes (appropriate for developmental age) Is the patient deaf or have difficulty hearing?: No Does the patient have difficulty seeing, even when wearing glasses/contacts?: No Does the patient have difficulty concentrating, remembering, or making decisions?:  No  Permission Sought/Granted Permission sought to share information with : Family Supports Permission granted to share information with : No              Emotional Assessment Appearance:: Appears stated age Attitude/Demeanor/Rapport: Other (comment) (distracted, patient states that he is not feeling well unable to focus) Affect (typically observed): Quiet Orientation: : Oriented to Self, Oriented to Place, Oriented to  Time, Oriented to Situation Alcohol  / Substance Use: Not Applicable Psych Involvement: No (comment)  Admission diagnosis:  AKI (acute kidney injury) [N17.9] Abdominal pain [R10.9] Abdominal pain, unspecified abdominal location [R10.9] Patient Active Problem List   Diagnosis Date Noted   Elevated brain natriuretic peptide (BNP) level 12/27/2024   Osteomyelitis of great toe of left foot (HCC) 12/19/2024   Leukocytosis 12/19/2024   Abdominal pain 12/17/2024   Neutropenic fever 12/13/2024   Sepsis (HCC) 12/13/2024   Acute encephalopathy 12/13/2024   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) 06/25/2023   Cellulitis 06/12/2023   No appetite 06/09/2023   S/P BKA (below knee amputation) unilateral, right (HCC) - on 05-30-2023 06/07/2023   Protein-calorie malnutrition, severe 06/03/2023   Nonhealing surgical wound, initial encounter 05/27/2023   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 05/09/2023   Pancreatic mass 05/09/2023   Constipation 05/09/2023   Glaucoma 05/09/2023   PTSD (post-traumatic stress disorder) 08/14/2022   Nightmares associated with chronic post-traumatic stress disorder 08/14/2022   Major depressive disorder, 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/11/2022   Abnormality of aortic valve 01/29/2022  Depression    Hypertensive retinopathy of both eyes 02/01/2019   Chronic kidney disease, stage 3b (HCC) 08/28/2017   COPD (chronic obstructive pulmonary disease) (HCC) 08/28/2017   CAD (coronary artery disease) 04/10/2016    Hyperlipidemia 04/10/2016   Benign prostatic hyperplasia 04/10/2016   Diabetes (HCC) 04/10/2016   Type 2 diabetes mellitus with complication, with long-term current use of insulin  (HCC) 10/17/2015   AKI (acute kidney injury) 10/17/2015   Dyslipidemia associated with type 2 diabetes mellitus (HCC) 10/17/2015   Chronic hyponatremia - baseline 125-134 05/12/2015   Hypertension associated with diabetes [facility])    PCP:  [facility]:   [facility] - [place], [place] - [address] [place] [address] Phone: [phone] Fax: [phone]  [id number] - [place], [place] - [address] [place] [address] Phone: [phone] Fax: [phone]  [facility] [address] [place] [address] Phone: [phone] Fax: [phone]     Social Drivers of Health (SDOH) Social History: SDOH Screenings   Food Insecurity: No Food Insecurity (12/27/2024)  Housing: Low Risk (12/27/2024)  Transportation Needs: No Transportation Needs (12/27/2024)  Utilities: Not At Risk (12/27/2024)  Depression (PHQ2-9): High Risk (08/11/2022)  Social Connections: Unknown (12/27/2024)  Tobacco Use: Medium Risk (12/27/2024)   SDOH Interventions:     Readmission Risk Interventions    12/29/2024    3:24 PM 12/20/2024   12:27 PM 05/29/2023    2:21 PM  Readmission Risk Prevention Plan  Transportation Screening Complete Complete Complete  PCP or Specialist Appt within 3-5 Days Complete Complete   HRI or Home Care Consult Complete Complete   Social Work Consult for Recovery Care Planning/Counseling Complete Complete   Palliative Care Screening Not Applicable Not Applicable   Medication Review Oceanographer) Referral to Pharmacy Complete Complete  PCP or Specialist appointment within 3-5 days of discharge   Complete  HRI or Home Care Consult    Complete  SW Recovery Care/Counseling Consult   Complete  Palliative Care Screening   Not Applicable  Skilled Nursing Facility   Complete

## 2024-12-29 NOTE — Progress Notes (Signed)
 Nutrition Follow-up  INTERVENTION:   Ensure Plus High Protein po TID, each supplement provides 350 kcal and 20 grams of protein.  Continue on Carb Mod diet for adequate nutrition intake.  NUTRITION DIAGNOSIS:   Increased nutrient needs related to cancer and cancer related treatments as evidenced by estimated needs.  Remains applicable   GOAL:   Patient will meet greater than or equal to 90% of their needs  Progressing   MONITOR:   PO intake, Supplement acceptance, Weight trends  REASON FOR ASSESSMENT:   Malnutrition Screening Tool  ASSESSMENT:   PMH: pancreatic adenocarcinoma, CAD, AKA, T2DM, CKD3, HTN, COPD Admitted with: AKI; presenting abdominal pain and constipation  Followed up on pt again today. Pt was more pleasant and a little more open to talk, no family at bedside. Pt reported he had a little bit of an appetite this morning and consumed some breakfast consisting of bites of eggs and oatmeal. He is tolerating the Ensures. Pt is currently NPO for a procedure today but and is amenable to continue to drink Ensures once cleared to.   Pt had a BM yesterday. Pt reported to feeling slightly better today. Pt refused NFPE again today.   Pt reported his UBW 77.3 kg.   Admit weight: 62.5 kg  Current weight: 62.5 kg    Average Meal Intake: 2/5- 100% of 1 meal recorded- pt reports to only have consumed bites.   Nutritionally Relevant Medications: Scheduled Meds:  enoxaparin  (LOVENOX ) injection  40 mg Subcutaneous Q24H   feeding supplement  237 mL Oral TID BM   insulin  aspart  0-5 Units Subcutaneous QHS   insulin  aspart  0-9 Units Subcutaneous TID WC   insulin  glargine-yfgn  20 Units Subcutaneous Daily   lactulose   20 g Oral BID   linagliptin   5 mg Oral Daily   pantoprazole   40 mg Oral Daily   senna-docusate  3 tablet Oral BID   SMOG  300 mL Rectal Once   Continuous Infusions:  sodium chloride  100 mL/hr at 12/29/24 0820   PRN Meds: ondansetron  (ZOFRAN )  IV  Labs Reviewed: Creatinine: 2.14 Alkaline Phosphatase: 706 AST: 63 ALT: 55 GFR: 34 Iron: 28 Ferritin: 2,952 CBG ranges from 84-192 mg/dL over the last 24 hours HgbA1c 7.8   NUTRITION - FOCUSED PHYSICAL EXAM:  Pt Refused at the time of Follow Up   Diet Order:   Diet Order             Diet Carb Modified Room service appropriate? Yes  Diet effective now                   EDUCATION NEEDS:   Not appropriate for education at this time  Skin:  Skin Assessment: Reviewed RN Assessment  Last BM:  2/5- type 3  Height:   Ht Readings from Last 1 Encounters:  12/27/24 5' 11 (1.803 m)    Weight:   Wt Readings from Last 1 Encounters:  12/27/24 62.4 kg    Ideal Body Weight:  70.38 kg (adjusted for AKA)  BMI:  Body mass index is 21.3 kg/m. (Adjusted for AKA)  Estimated Nutritional Needs:   Kcal:  2000-2300  Protein:  100-115 gram  Fluid:  2.0 L    Con Friends, Dietetic Intern

## 2024-12-29 NOTE — Plan of Care (Signed)
  Problem: Nutrition: Goal: Adequate nutrition will be maintained Outcome: Progressing   Problem: Elimination: Goal: Will not experience complications related to bowel motility Outcome: Progressing   Problem: Pain Managment: Goal: General experience of comfort will improve and/or be controlled Outcome: Progressing   Problem: Safety: Goal: Ability to remain free from injury will improve Outcome: Progressing   Problem: Skin Integrity: Goal: Risk for impaired skin integrity will decrease Outcome: Progressing

## 2024-12-30 LAB — CBC
HCT: 21.5 % — ABNORMAL LOW (ref 39.0–52.0)
Hemoglobin: 7.1 g/dL — ABNORMAL LOW (ref 13.0–17.0)
MCH: 26.2 pg (ref 26.0–34.0)
MCHC: 33 g/dL (ref 30.0–36.0)
MCV: 79.3 fL — ABNORMAL LOW (ref 80.0–100.0)
Platelets: 611 10*3/uL — ABNORMAL HIGH (ref 150–400)
RBC: 2.71 MIL/uL — ABNORMAL LOW (ref 4.22–5.81)
RDW: 21.2 % — ABNORMAL HIGH (ref 11.5–15.5)
WBC: 7.5 10*3/uL (ref 4.0–10.5)
nRBC: 0 % (ref 0.0–0.2)

## 2024-12-30 LAB — COMPREHENSIVE METABOLIC PANEL WITH GFR
ALT: 41 U/L (ref 0–44)
AST: 53 U/L — ABNORMAL HIGH (ref 15–41)
Albumin: 3.1 g/dL — ABNORMAL LOW (ref 3.5–5.0)
Alkaline Phosphatase: 576 U/L — ABNORMAL HIGH (ref 38–126)
Anion gap: 11 (ref 5–15)
BUN: 16 mg/dL (ref 8–23)
CO2: 22 mmol/L (ref 22–32)
Calcium: 9.6 mg/dL (ref 8.9–10.3)
Chloride: 110 mmol/L (ref 98–111)
Creatinine, Ser: 2.1 mg/dL — ABNORMAL HIGH (ref 0.61–1.24)
GFR, Estimated: 34 mL/min — ABNORMAL LOW
Glucose, Bld: 142 mg/dL — ABNORMAL HIGH (ref 70–99)
Potassium: 3.6 mmol/L (ref 3.5–5.1)
Sodium: 143 mmol/L (ref 135–145)
Total Bilirubin: 0.3 mg/dL (ref 0.0–1.2)
Total Protein: 5.9 g/dL — ABNORMAL LOW (ref 6.5–8.1)

## 2024-12-30 LAB — GLUCOSE, CAPILLARY
Glucose-Capillary: 122 mg/dL — ABNORMAL HIGH (ref 70–99)
Glucose-Capillary: 156 mg/dL — ABNORMAL HIGH (ref 70–99)

## 2024-12-30 MED ORDER — HEPARIN SOD (PORK) LOCK FLUSH 100 UNIT/ML IV SOLN
500.0000 [IU] | INTRAVENOUS | Status: DC | PRN
  Administered 2024-12-30: 500 [IU]

## 2024-12-30 MED ORDER — SENNOSIDES-DOCUSATE SODIUM 8.6-50 MG PO TABS: 3.0000 | ORAL_TABLET | Freq: Two times a day (BID) | ORAL | Status: AC

## 2024-12-30 MED ORDER — POLYETHYLENE GLYCOL 3350 17 G PO PACK: 17.0000 g | PACK | Freq: Two times a day (BID) | ORAL | 0 refills | Status: AC

## 2024-12-30 MED ORDER — DULCOLAX 5 MG PO TBEC: 5.0000 mg | DELAYED_RELEASE_TABLET | Freq: Every day | ORAL | 1 refills | Status: AC | PRN

## 2024-12-30 MED ORDER — HEPARIN SOD (PORK) LOCK FLUSH 100 UNIT/ML IV SOLN
INTRAVENOUS | Status: AC
  Filled 2024-12-30: qty 5

## 2024-12-30 MED ORDER — FLUTICASONE-SALMETEROL 500-50 MCG/ACT IN AEPB: 1.0000 | INHALATION_SPRAY | Freq: Two times a day (BID) | RESPIRATORY_TRACT | 2 refills | Status: AC

## 2024-12-30 MED ORDER — HEPARIN SOD (PORK) LOCK FLUSH 100 UNIT/ML IV SOLN: 500.0000 [IU] | INTRAVENOUS | Status: DC

## 2024-12-30 NOTE — Plan of Care (Signed)

## 2024-12-30 NOTE — Progress Notes (Signed)
 Discharge instructions reviewed with patient, verbalized understanding. All questions answered. All belongings accounted for. Patient to follow up with MD in  1-2 weeks. Assisted via WC to private vehicle.   Patient's port deaccesed by Angeline naomi PEAK

## 2024-12-30 NOTE — Discharge Summary (Signed)
 Physician Discharge Summary  Jose [name] FMW:[id number] DOB: [birth year] DOA: 12/27/2024  PCP: [facility]  Admit date: 12/27/2024 Discharge date: 12/30/2024  Admitted From: home Disposition:home Recommendations for Outpatient Follow-up:  Follow up with PCP in 1-2 weeks Please obtain BMP/CBC in one week Please follow up  with oncologist dr tina [facility] Equipment/Devices:none  Discharge Condition:stable CODE STATUS:full Diet recommendation: cardiac Brief/Interim Summary:   64 year old male with a history of pancreatic adenocarcinoma currently on chemotherapy at [facility] oncologist Dr. Oneil Tina on gemcitabine and paclitaxel, type 2 diabetes diabetic nephropathy with glomerulosclerosis stage IIIa CKD right AKA, depression gout CBD stenting CAD COPD hypertension hyperlipidemia PAD admitted with poor p.o. intake AKI and constipation.  He reports nausea and vomiting and has not had a bowel movement for 4 days prior to admission.  CT scan abdomen does not show any evidence of obstruction but showed fluid distended small bowel throughout the abdomen and small volume fecal loading throughout the colon.     He was admitted to [facility] January 20 through January 27 for acute encephalopathy neutropenic fever and sepsis and found to have left great toe osteomyelitis by MRI.  Ortho was consulted recommended oral antibiotics and outpatient follow-up with Dr Elsa.  During that hospital stay he had thrombocytopenia likely from chemo and Lovenox .  After stopping Lovenox  his count improved and he received fondaparinux  for DVT prophylaxis.  He He takes Dilaudid  at home on a regular basis and is mostly bedbound.  He lives alone.  A CT of the chest showed no evidence of PE.   CT abdomen and pelvis -No acute abnormality within the abdomen or pelvis. Decreased size and conspicuity of the pancreatic head and uncinate process mass, measuring approximately 1.4 x 1.3 cm, previously 2.5 x 2.0  cm. No evidence of metas burn tatic disease. Unchanged 3.4 cm multiseptated cystic lesion in the pancreatic tail with a single chunky calcification. Metallic biliary duct stent in the distal common bile duct with small volume pneumobilia. Nonobstructive right lower pole calculus. No hydronephrosis in either kidney.   Discharge Diagnoses:  Principal Problem:   AKI (acute kidney injury) Active Problems:   CAD (coronary artery disease)   Constipation   Abdominal pain   Elevated brain natriuretic peptide (BNP) level    #1 Opioid-induced constipation in the setting of daily Dilaudid  use for cancer-related pain and pancreatic adenocarcinoma.  Patient has not had a BM for 4 days prior to admission to hospital  CT scan of the abdomen does not show evidence of obstruction but does show fluid-filled loops of small bowel and stool burden and large bowel. He reports having 1 BM overnight.  Treated withMiraLAX Dulcolax senna. Lactulose  x 3 doses    #2 AKI on CKD stage III A due to poor p.o. intake nausea and vomiting.  CT abdomen showed no evidence of obstructive uropathy or hydronephrosis.   Treated with ivf.    #3 elevated proBNP with history of CAD patient has no evidence of fluid overload in fact he looks more on the dry side.  This could be falsely elevated due to AKI Echo in 2023 EF 65 to 70%.  Echocardiogram EF 55 to 60% with severe concentric LVH and grade 1 diastolic dysfunction Continue aspirin  Plavix  beta-blocker and statin   #4 pancreatic adenocarcinoma followed by [facility] oncology Dr. Tina.   #5 elevated alkaline phosphatase with mildly elevated AST and ALT with normal bilirubin - Improved    #6 anemia of chronic disease/iron deficiency-no signs  of active bleeding.  Hemoglobin 7.5 from 7.7 from 8.2 this admission.  Likely from hemodilution. Last iron panel done in 2024 with iron of 8, TIBC 162 ferritin 1502. Iron panel indicates low iron count with decreased saturation.   #7  type 2 diabetes with hyperglycemia uncontrolled Last A1c was 7.8 from January 2026. Continue long-acting insulin .   #8 recent left great toe osteomyelitis on doxycycline  and ciprofloxacin  to finish the course.  He does have follow-up appointment with Ortho next week.  Continue antibiotics till seen by Ortho.   #9 essential hypertension on amlodipine  and carvedilol    #10 mood disorder on Zyprexa  and Remeron    #11 GERD on PPI   #12 DVT prophylaxis he tolerated lovenox .platelets remained stable.    Nutrition Problem: Increased nutrient needs Etiology: cancer and cancer related treatments    Signs/Symptoms: estimated needs     Interventions: Refer to RD note for recommendations  Estimated body mass index is 19.19 kg/m as calculated from the following:   Height as of this encounter: 5' 11 (1.803 m).   Weight as of this encounter: 62.4 kg.  Discharge Instructions  Discharge Instructions     Increase activity slowly   Complete by: As directed    No wound care   Complete by: As directed       Allergies as of 12/30/2024       Reactions   Lisinopril -hydrochlorothiazide  Swelling, Other (See Comments)   Causes swelling of lips   Simvastatin Other (See Comments)   ALT (SGPT) level raised, Aspartate aminotransferase serum level raised        Medication List     STOP taking these medications    carvedilol  6.25 MG tablet Commonly known as: COREG    colchicine  0.6 MG tablet   nitroGLYCERIN  0.4 MG SL tablet Commonly known as: NITROSTAT    thiamine  100 MG tablet Commonly known as: Vitamin B-1       TAKE these medications    acetaminophen  500 MG tablet Commonly known as: TYLENOL  Take 1,000 mg by mouth in the morning, at noon, and at bedtime.   albuterol  108 (90 Base) MCG/ACT inhaler Commonly known as: VENTOLIN  HFA Inhale 2 puffs into the lungs every 6 (six) hours as needed for wheezing or shortness of breath.   amLODipine  10 MG tablet Commonly known as:  NORVASC  Take 1 tablet (10 mg total) by mouth daily.   aspirin  EC 81 MG tablet Take 81 mg by mouth daily. Swallow whole.   atorvastatin  80 MG tablet Commonly known as: LIPITOR  Take 1 tablet (80 mg total) by mouth daily. What changed: when to take this   ciprofloxacin  500 MG tablet Commonly known as: Cipro  Take 1 tablet (500 mg total) by mouth 2 (two) times daily.   clopidogrel  75 MG tablet Commonly known as: PLAVIX  Take 1 tablet (75 mg total) by mouth daily with breakfast.   doxycycline  100 MG tablet Commonly known as: VIBRA -TABS Take 1 tablet (100 mg total) by mouth 2 (two) times daily.   Dulcolax 5 MG EC tablet Generic drug: bisacodyl  Take 1 tablet (5 mg total) by mouth daily as needed for moderate constipation.   ferrous sulfate  325 (65 FE) MG EC tablet Take 325 mg by mouth every Monday, Wednesday, and Friday.   fluticasone -salmeterol 500-50 MCG/ACT Aepb Commonly known as: ADVAIR Inhale 1 puff into the lungs in the morning and at bedtime.   folic acid  1 MG tablet Commonly known as: FOLVITE  Take 1 tablet (1 mg total) by mouth daily.  gabapentin  300 MG capsule Commonly known as: NEURONTIN  Take 1 capsule (300 mg total) by mouth 3 (three) times daily as needed.   insulin  glargine 100 UNIT/ML injection Commonly known as: LANTUS  Inject 0.2 mLs (20 Units total) into the skin daily.   latanoprost  0.005 % ophthalmic solution Commonly known as: XALATAN  Place 1 drop into both eyes at bedtime.   magnesium  84 MG ( ) Tbcr SR tablet Commonly known as: MAGTAB Take 84 mg by mouth 3 (three) times daily between meals.   mirtazapine  15 MG tablet Commonly known as: REMERON  Take 1 tablet (15 mg total) by mouth at bedtime.   naloxone  4 MG/0.1ML Liqd nasal spray kit Commonly known as: NARCAN  Place 1 spray into the nose once. Instill 1 spray into one nostril as directed for opioid overdose, call 911 immediately, administer dose, then turn person on side if no response in 2-3  minutes or person responds but relapses. Repeat using a new spray device and into the other nostril.   omeprazole 40 MG capsule Commonly known as: PRILOSEC Take 40 mg by mouth daily.   polyethylene glycol 17 g packet Commonly known as: MiraLax  Take 17 g by mouth 2 (two) times daily. What changed:  when to take this reasons to take this   senna-docusate 8.6-50 MG tablet Commonly known as: Senokot-S Take 3 tablets by mouth 2 (two) times daily.   SITagliptin  100 MG Tabs Take 100 mg by mouth daily at 12 noon.        Follow-up Information     [facility]. Call in 1 week(s).   Specialty: General Practice Contact information: [address] [place] [address] [phone]                Allergies[1]  Consultations: none   Procedures/Studies: ECHOCARDIOGRAM COMPLETE Result Date: 12/28/2024    ECHOCARDIOGRAM REPORT   Patient Name:   TELL ROZELLE Coppess Date of Exam: 12/28/2024 Medical Rec #:  [id number]          Height:       71.0 in Accession #:    [id number]         Weight:       137.6 lb Date of Birth:  [birth year]          BSA:          1.799 m Patient Age:    63 years           BP:           138/78 mmHg Patient Gender: M                  HR:           55 bpm. Exam Location:  Inpatient Procedure: 2D Echo, Cardiac Doppler and Color Doppler (Both Spectral and Color            Flow Doppler were utilized during procedure). Indications:    CHF  History:        Patient has prior history of Echocardiogram examinations, most                 recent 01/28/2022. CAD, COPD; Risk Factors:Hypertension, Diabetes                 and Dyslipidemia.  Sonographer:    Philomena Daring Referring Phys: [id number] RATHORE  Sonographer Comments: Image acquisition challenging due to respiratory motion. IMPRESSIONS  1. Left ventricular ejection fraction, by estimation, is 55 to 60%. The left ventricle has  normal function. The left ventricle has no regional wall motion abnormalities. There  is severe concentric left ventricular hypertrophy. Left ventricular diastolic  parameters are consistent with Grade I diastolic dysfunction (impaired relaxation).  2. Right ventricular systolic function is normal. The right ventricular size is normal.  3. A small pericardial effusion is present. The pericardial effusion is circumferential.  4. The mitral valve is normal in structure. Mild to moderate mitral valve regurgitation.  5. The aortic valve is tricuspid. There is moderate thickening of the aortic valve. Aortic valve regurgitation is not visualized. Aortic valve sclerosis/calcification is present, without any evidence of aortic stenosis.  6. The inferior vena cava is normal in size with greater than 50% respiratory variability, suggesting right atrial pressure of 3 mmHg. FINDINGS  Left Ventricle: Left ventricular ejection fraction, by estimation, is 55 to 60%. The left ventricle has normal function. The left ventricle has no regional wall motion abnormalities. The left ventricular internal cavity size was small. There is severe concentric left ventricular hypertrophy. Left ventricular diastolic parameters are consistent with Grade I diastolic dysfunction (impaired relaxation). Right Ventricle: The right ventricular size is normal. No increase in right ventricular wall thickness. Right ventricular systolic function is normal. Left Atrium: Left atrial size was normal in size. Right Atrium: Right atrial size was normal in size. Pericardium: A small pericardial effusion is present. The pericardial effusion is circumferential. Presence of epicardial fat layer. Mitral Valve: The mitral valve is normal in structure. Mild to moderate mitral valve regurgitation. Tricuspid Valve: The tricuspid valve is normal in structure. Tricuspid valve regurgitation is trivial. No evidence of tricuspid stenosis. Aortic Valve: The aortic valve is tricuspid. There is moderate thickening of the aortic valve. Aortic valve regurgitation  is not visualized. Aortic valve sclerosis/calcification is present, without any evidence of aortic stenosis. Pulmonic Valve: The pulmonic valve was normal in structure. Pulmonic valve regurgitation is trivial. No evidence of pulmonic stenosis. Aorta: The aortic root and ascending aorta are structurally normal, with no evidence of dilitation. Venous: The inferior vena cava is normal in size with greater than 50% respiratory variability, suggesting right atrial pressure of 3 mmHg. IAS/Shunts: No atrial level shunt detected by color flow Doppler.  LEFT VENTRICLE PLAX 2D LVIDd:         2.70 cm   Diastology LVIDs:         1.90 cm   LV e' medial:    5.00 cm/s LV PW:         1.60 cm   LV E/e' medial:  0.1 LV IVS:        1.50 cm   LV e' lateral:   8.49 cm/s LVOT diam:     2.30 cm   LV E/e' lateral: 0.1 LV SV:         9099 LV SV Index:   5059 LVOT Area:     4.15 cm  RIGHT VENTRICLE             IVC RV Basal diam:  2.70 cm     IVC diam: 1.40 cm RV Mid diam:    2.20 cm RV Length:      4.60 cm RV S prime:     15.90 cm/s TAPSE (M-mode): 2.0 cm LEFT ATRIUM           Index        RIGHT ATRIUM LA diam:      3.30 cm 1.83 cm/m   RA Pressure: 3.00 mmHg LA Vol (A2C): 23.6 ml 13.12 ml/m  LA Vol (A4C): 21.6 ml 12.01 ml/m  AORTIC VALVE LVOT Vmax:   98.40 cm/s LVOT VTI:    21.900 m MV E velocity: 0.60 cm/s   TRICUSPID VALVE MV A velocity: 78.60 cm/s  Estimated RAP:  3.00 mmHg MV E/A ratio:  0.01                            SHUNTS                            Systemic VTI:  21.90 m                            Systemic Diam: 2.30 cm Kardie Tobb DO Electronically signed by Dub Huntsman DO Signature Date/Time: 12/28/2024/1:08:05 PM    Final    CT ABDOMEN PELVIS W CONTRAST Result Date: 12/27/2024 EXAM: CT ABDOMEN AND PELVIS WITH CONTRAST 12/27/2024 06:06:37 PM TECHNIQUE: CT of the abdomen and pelvis was performed with the administration of intravenous contrast. Multiplanar reformatted images are provided for review. Automated exposure control,  iterative reconstruction, and/or weight-based adjustment of the mA/kV was utilized to reduce the radiation dose to as low as reasonably achievable. 75 mL of iohexol  (OMNIPAQUE ) 350 MG/ML injection was administered. COMPARISON: Prior study dated 12/13/2024. CLINICAL HISTORY: Abdominal pain, acute, nonlocalized. Pancreatic cancer currently undergoing chemotherapy. FINDINGS: LOWER CHEST: No acute abnormality. LIVER: Small volume pneumobilia. GALLBLADDER AND BILE DUCTS: Similarly positioned metallic biliary duct stent in the distal common bile duct (CBD). SPLEEN: No acute abnormality. PANCREAS: Mild pancreatic parenchymal atrophy. The hypodense mass centered in the pancreatic head uncinate process is less conspicuous, but measures approximately 1.4 x 1.3 cm (previously 2.5 x 2 cm). Unchanged 3.4 cm multiseptated cystic lesion in the pancreatic tail with a chunky calcification present. ADRENAL GLANDS: No acute abnormality. KIDNEYS, URETERS AND BLADDER: Small nonobstructive right lower pole calyceal calculus. Unchanged metallic density structure within the lower pole of the left kidney. No hydronephrosis. No perinephric or periureteral stranding. The urinary bladder is mostly decompressed, without focal abnormality. GI AND BOWEL: Stomach demonstrates no acute abnormality. Fluid distended, but not dilated small bowel throughout the abdomen. No small bowel wall thickening. Normal gas filled noninflamed appendix. Small volume fecal loading throughout the colon. There is no bowel obstruction. PERITONEUM AND RETROPERITONEUM: No ascites. No free air. No free pelvic fluid. VASCULATURE: Aorta is normal in caliber. Diffuse aortoiliac atherosclerosis. LYMPH NODES: No lymphadenopathy. REPRODUCTIVE ORGANS: No prostatomegaly. BONES AND SOFT TISSUES: Multilevel degenerative disc disease of the spine. Moderate bilateral hip osteoarthritis. No acute osseous abnormality. No focal soft tissue abnormality. IMPRESSION: 1. No acute  abnormality within the abdomen or pelvis. 2. Decreased size and conspicuity of the pancreatic head and uncinate process mass, measuring approximately 1.4 x 1.3 cm, previously 2.5 x 2.0 cm. No evidence of metas burn tatic disease. 3. Unchanged 3.4 cm multiseptated cystic lesion in the pancreatic tail with a single chunky calcification. 4. Metallic biliary duct stent in the distal common bile duct with small volume pneumobilia. 5. Nonobstructive right lower pole calculus. No hydronephrosis in either kidney. Electronically signed by: Rogelia Myers MD 12/27/2024 06:37 PM EST RP Workstation: [id number]   CT Angio Chest PE W and/or Wo Contrast Result Date: 12/27/2024 EXAM: CTA CHEST 12/27/2024 06:06:37 PM TECHNIQUE: CTA of the chest was performed after the administration of 75 mL of iohexol  (OMNIPAQUE ) 350 MG/ML injection. Multiplanar reformatted  images are provided for review. MIP images are provided for review. Automated exposure control, iterative reconstruction, and/or weight based adjustment of the mA/kV was utilized to reduce the radiation dose to as low as reasonably achievable. COMPARISON: None available. CLINICAL HISTORY: Pulmonary embolism (PE) suspected, high probability. FINDINGS: PULMONARY ARTERIES: Pulmonary arteries are adequately opacified for evaluation. No acute pulmonary embolus. Main pulmonary artery is normal in caliber. MEDIASTINUM: right chest port in place, terminating in the right atrium. Trace pericardial effusion. Severe multivessel coronary atherosclerosis. There is no acute abnormality of the thoracic aorta. Normal variant 2 vessel aortic arch anatomy. Scattered aortic atherosclerosis. LYMPH NODES: No mediastinal, hilar or axillary lymphadenopathy. LUNGS AND PLEURA: Posterior bibasilar dependent atelectasis. No focal consolidation or pulmonary edema. No evidence of pleural effusion or pneumothorax. UPPER ABDOMEN: For findings below the diaphragm, please see that separately dictated CT of  the abdomen and pelvis report, which was performed concurrently. SOFT TISSUES AND BONES: No acute bone or soft tissue abnormality. IMPRESSION: 1. No pulmonary embolism. No pneumonia, pulmonary edema, or pleural effusion. Electronically signed by: Rogelia Myers MD 12/27/2024 06:22 PM EST RP Workstation: [id number]   DG Chest 2 View Result Date: 12/27/2024 CLINICAL DATA:  Chest pain EXAM: CHEST - 2 VIEW COMPARISON:  December 13, 2024 FINDINGS: The heart size and mediastinal contours are within normal limits. Right internal jugular Port-A-Cath is unchanged. Both lungs are clear. The visualized skeletal structures are unremarkable. IMPRESSION: No active cardiopulmonary disease. Electronically Signed   By: Lynwood Landy Raddle M.D.   On: 12/27/2024 16:48   MR FOOT LEFT W WO CONTRAST Result Date: 12/16/2024 CLINICAL DATA:  Left great toenail unroofing with wound. Pain. Concern for osteomyelitis. EXAM: MRI OF THE LEFT FOREFOOT WITHOUT AND WITH CONTRAST TECHNIQUE: Multiplanar, multisequence MR imaging of the left foot was performed both before and after administration of intravenous contrast. CONTRAST:  6mL GADAVIST  GADOBUTROL  1 MMOL/ML IV SOLN COMPARISON:  Radiographs of the left foot dated 12/14/2024. FINDINGS: Bones/Joint/Cartilage Marked marrow edema of the distal phalanx of the great toe with suspected decreased T1 hypointensity of the tuft, concerning for osteomyelitis. No convincing marrow signal abnormality identified elsewhere to suggest osteomyelitis. No acute fracture or dislocation. 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 No significant joint effusion. Ligaments Collateral ligaments of the MTP joints appear intact. Lisfranc ligament is intact. Muscles and Tendons No significant tenosynovitis.  No discrete intramuscular collection. Soft tissue Irregularity at the level of the nailbed. No loculated fluid collection. IMPRESSION: 1. Irregularity at the level of the great toe nailbed with marked  marrow edema of the underlying distal phalanx of the great toe, concerning for osteomyelitis. 2. No loculated fluid collection. Electronically Signed   By: Harrietta Sherry M.D.   On: 12/16/2024 16:48   DG Foot 2 Views Left Result Date: 12/14/2024 CLINICAL DATA:  New left great toenail unroofing with wound. Pain. EXAM: LEFT FOOT - 2 VIEW COMPARISON:  11/09/2024 FINDINGS: No erosive or bony destructive changes. No abnormal bone density to suggest osteomyelitis. No acute fracture. Scattered osteoarthritis, mild for age. Small plantar calcaneal spur. Extensive arterial vascular calcifications. No soft tissue gas or radiopaque foreign body. Lucency about the great toe nail bed consistent with clinical history. IMPRESSION: 1. No radiographic evidence of osteomyelitis. 2. Lucency about the great toe nail bed consistent with clinical history of unroofing. 3. Extensive arterial vascular calcifications. Electronically Signed   By: Andrea Gasman M.D.   On: 12/14/2024 13:32   CT HEAD WO CONTRAST ( ) Result Date: 12/13/2024  CLINICAL DATA:  History of pancreatic cancer, current Leigh receiving chemotherapy, presenting with neutropenic fever, nausea, vomiting and lower abdominal pain. EXAM: CT HEAD WITHOUT CONTRAST TECHNIQUE: Contiguous axial images were obtained from the base of the skull through the vertex without intravenous contrast. RADIATION DOSE REDUCTION: This exam was performed according to the departmental dose-optimization program which includes automated exposure control, adjustment of the mA and/or kV according to patient size and/or use of iterative reconstruction technique. COMPARISON:  Apr 05, 2013 FINDINGS: Brain: There is generalized cerebral atrophy with widening of the extra-axial spaces and ventricular dilatation. There are areas of decreased attenuation within the white matter tracts of the supratentorial brain, consistent with microvascular disease changes. Vascular: Marked severity bilateral  cavernous carotid artery calcification is noted. Skull: Normal. Negative for fracture or focal lesion. Sinuses/Orbits: There is mild bilateral ethmoid sinus mucosal thickening. Other: None. IMPRESSION: 1. Generalized cerebral atrophy with chronic white matter small vessel ischemic changes. 2. No acute intracranial abnormality. 3. Mild bilateral ethmoid sinus disease. Electronically Signed   By: Suzen Dials M.D.   On: 12/13/2024 17:06   CT CHEST WO CONTRAST Result Date: 12/13/2024 CLINICAL DATA:  Fever. EXAM: CT CHEST WITHOUT CONTRAST TECHNIQUE: Multidetector CT imaging of the chest was performed following the standard protocol without IV contrast. RADIATION DOSE REDUCTION: This exam was performed according to the departmental dose-optimization program which includes automated exposure control, adjustment of the mA and/or kV according to patient size and/or use of iterative reconstruction technique. COMPARISON:  Chest CT dated 06/16/2020. FINDINGS: Evaluation of this exam is limited in the absence of intravenous contrast. Cardiovascular: There is no cardiomegaly or pericardial effusion. Advanced coronary vascular calcification. Right-sided Port-A-Cath with tip in the right atrium close to the cavoatrial junction. Mild atherosclerotic calcification of the thoracic aorta. No aneurysmal dilatation. The central pulmonary arteries are grossly unremarkable. Mediastinum/Nodes: No obvious hilar or mediastinal adenopathy. The esophagus is grossly unremarkable. No mediastinal fluid collection. Lungs/Pleura: Minimal bibasilar subpleural atelectasis. No focal consolidation, pleural effusion or pneumothorax. The central airways are patent. Upper Abdomen: See report for CT abdomen pelvis. Musculoskeletal: No acute osseous pathology. IMPRESSION: 1. No acute intrathoracic pathology. 2. Advanced coronary vascular calcification. 3.  Aortic Atherosclerosis (ICD10-I70.0). Electronically Signed   By: Vanetta Chou M.D.   On:  12/13/2024 16:58   CT ABDOMEN PELVIS W CONTRAST Result Date: 12/13/2024 CLINICAL DATA:  Chest pain with diarrhea and abdominal pain. History of pancreatic cancer currently undergoing chemotherapy. EXAM: CT ABDOMEN AND PELVIS WITH CONTRAST TECHNIQUE: Multidetector CT imaging of the abdomen and pelvis was performed using the standard protocol following bolus administration of intravenous contrast. RADIATION DOSE REDUCTION: This exam was performed according to the departmental dose-optimization program which includes automated exposure control, adjustment of the mA and/or kV according to patient size and/or use of iterative reconstruction technique. CONTRAST:  75mL OMNIPAQUE  IOHEXOL  350 MG/ML SOLN COMPARISON:  08/14/2024 FINDINGS: Lower chest: Mild stable cardiomegaly. Minimal calcified plaque over the descending thoracic aorta. Visualized lung bases are clear. Hepatobiliary: Interval replacement of common bile duct stent which appears in adequate position. Air seen within the more proximal common bile duct as well as air within the nondependent portion of the gallbladder. Minimal central intrahepatic biliary air. Liver is otherwise unremarkable. Pancreas: Stable 3 cm slightly septated cystic lesion over the pancreatic tail. Previously seen 2.5 cm pancreatic head mass not as well defined as on the prior exam as there is very minimal ill-defined low-attenuation over the pancreatic head. Spleen: Normal. Adrenals/Urinary Tract: Adrenal glands are  normal. Kidneys are normal in size without hydronephrosis. 4 mm calcification over the lower pole right kidney unchanged. Metallic density over the lower pole left kidney unchanged. Resolution of previous seen left perinephric hematoma. Ureters and bladder are normal. Stomach/Bowel: Stomach is within normal. Small bowel is normal. Appendix is normal. Air and fluid throughout the colon which is otherwise unremarkable. No free peritoneal air. Vascular/Lymphatic: Mild calcified  plaque over the abdominal aorta which is normal in caliber. Few small stable portacaval lymph nodes. No other significant adenopathy. Reproductive: Prostate is unremarkable. Other: No significant free peritoneal fluid or free peritoneal air. Musculoskeletal: Significant degenerative changes of the spine and mild degenerate changes of the hips. Multiple stable Schmorl's nodes over the spine without definite metastatic disease. IMPRESSION: 1. No acute findings in the abdomen/pelvis. 2. Interval replacement of common bile duct stent which appears in adequate position with air within the biliary system as described 3. Stable 3 cm slightly septated cystic lesion over the pancreatic tail. Previously seen 2.5 cm pancreatic head mass not as well defined as on the prior exam as there is very minimal ill-defined low-attenuation over the pancreatic head. 4. Stable 4 mm right renal stone. 5. Aortic atherosclerosis. Aortic Atherosclerosis (ICD10-I70.0). Electronically Signed   By: Toribio Agreste M.D.   On: 12/13/2024 11:05   DG Chest 1 View Result Date: 12/13/2024 CLINICAL DATA:  Weakness. EXAM: CHEST  1 VIEW COMPARISON:  11/09/2024 FINDINGS: Right IJ Port-A-Cath unchanged. Lungs are adequately inflated and otherwise clear. Cardiomediastinal silhouette and remainder of the exam is unchanged. IMPRESSION: No active disease. Electronically Signed   By: Toribio Agreste M.D.   On: 12/13/2024 07:44   (Echo, Carotid, EGD, Colonoscopy, ERCP)    Subjective:   Discharge Exam: Vitals:   12/30/24 0817 12/30/24 1110  BP: (!) 150/83 (!) 145/72  Pulse: 79   Resp:    Temp:    SpO2:     Vitals:   12/29/24 1927 12/30/24 0525 12/30/24 0817 12/30/24 1110  BP: 130/60 (!) 145/78 (!) 150/83 (!) 145/72  Pulse: 90 86 79   Resp: 18 18    Temp: 98.7 F (37.1 C) 98.6 F (37 C)    TempSrc: Oral Oral    SpO2: 99% 99%    Weight:      Height:        General: Pt is alert, awake, not in acute distress Cardiovascular: RRR, S1/S2 +,  no rubs, no gallops Respiratory: CTA bilaterally, no wheezing, no rhonchi Abdominal: Soft, NT, ND, bowel sounds + Extremities: raka   The results of significant diagnostics from this hospitalization (including imaging, microbiology, ancillary and laboratory) are listed below for reference.     Microbiology: No results found for this or any previous visit (from the past 240 hours).   Labs: BNP (last 3 results) No results for input(s): BNP in the last 8760 hours. Basic Metabolic Panel: Recent Labs  Lab 12/27/24 1606 12/28/24 0302 12/29/24 0451 12/30/24 0835  NA 138 144 143 143  K 4.1 4.2 3.9 3.6  CL 104 110 108 110  CO2 23 24 24 22   GLUCOSE 334* 249* 89 142*  BUN 31* 25* 21 16  CREATININE 2.13* 1.93* 2.14* 2.10*  CALCIUM  9.2 9.0 9.8 9.6   Liver Function Tests: Recent Labs  Lab 12/27/24 1606 12/28/24 0302 12/29/24 0451 12/30/24 0835  AST 58* 45* 63* 53*  ALT 59* 54* 55* 41  ALKPHOS 721* 656* 706* 576*  BILITOT 0.4 0.4 0.4 0.3  PROT 6.2* 5.8* 6.0*  5.9*  ALBUMIN 3.2* 3.1* 3.3* 3.1*   Recent Labs  Lab 12/27/24 1606  LIPASE <10*   No results for input(s): AMMONIA in the last 168 hours. CBC: Recent Labs  Lab 12/27/24 1606 12/28/24 0302 12/29/24 0451 12/30/24 0835  WBC 10.3 7.8 7.3 7.5  HGB 8.2* 7.7* 7.8* 7.1*  HCT 24.7* 23.2* 23.8* 21.5*  MCV 77.9* 78.1* 78.3* 79.3*  PLT 668* 622* 627* 611*   Cardiac Enzymes: No results for input(s): CKTOTAL, CKMB, CKMBINDEX, TROPONINI in the last 168 hours. BNP: Invalid input(s): POCBNP CBG: Recent Labs  Lab 12/29/24 1114 12/29/24 1707 12/29/24 2115 12/30/24 0733 12/30/24 1116  GLUCAP 159* 95 115* 122* 156*   D-Dimer No results for input(s): DDIMER in the last 72 hours. Hgb A1c No results for input(s): HGBA1C in the last 72 hours. Lipid Profile No results for input(s): CHOL, HDL, LDLCALC, TRIG, CHOLHDL, LDLDIRECT in the last 72 hours. Thyroid function studies No results for  input(s): TSH, T4TOTAL, T3FREE, THYROIDAB in the last 72 hours.  Invalid input(s): FREET3 Anemia work up Recent Labs    12/29/24 0451  FERRITIN 2,952*  TIBC 209*  IRON 28*   Urinalysis    Component Value Date/Time   COLORURINE STRAW (A) 12/27/2024 1857   APPEARANCEUR CLEAR 12/27/2024 1857   LABSPEC 1.019 12/27/2024 1857   PHURINE 6.0 12/27/2024 1857   GLUCOSEU 50 (A) 12/27/2024 1857   HGBUR SMALL (A) 12/27/2024 1857   BILIRUBINUR NEGATIVE 12/27/2024 1857   KETONESUR NEGATIVE 12/27/2024 1857   PROTEINUR NEGATIVE 12/27/2024 1857   UROBILINOGEN 1.0 07/23/2015 1403   NITRITE NEGATIVE 12/27/2024 1857   LEUKOCYTESUR NEGATIVE 12/27/2024 1857   Sepsis Labs Recent Labs  Lab 12/27/24 1606 12/28/24 0302 12/29/24 0451 12/30/24 0835  WBC 10.3 7.8 7.3 7.5   Microbiology No results found for this or any previous visit (from the past 240 hours).   Time coordinating discharge: 40 min SIGNED:   Almarie KANDICE Hoots, MD  [facility] 12/30/2024, 8:48 PM     [1]  Allergies Allergen Reactions   Lisinopril -Hydrochlorothiazide  Swelling and Other (See Comments)    Causes swelling of lips   Simvastatin Other (See Comments)    ALT (SGPT) level raised, Aspartate aminotransferase serum level raised
